# Patient Record
Sex: Female | Born: 1980 | Race: Black or African American | Hispanic: No | Marital: Single | Smoking: Former smoker
Health system: Southern US, Community
[De-identification: ages and names within clinical notes are randomized; demographics above are authoritative.]

## PROBLEM LIST (undated history)

## (undated) HISTORY — PX: EXTREMITY CYST EXCISION: SHX1558

---

## 2011-04-16 ENCOUNTER — Emergency Department (HOSPITAL_BASED_OUTPATIENT_CLINIC_OR_DEPARTMENT_OTHER)
Admission: EM | Admit: 2011-04-16 | Discharge: 2011-04-17 | Disposition: A | Discharge status: Home / Self Care | Payer: Self-pay | Attending: Emergency Medicine | Admitting: Emergency Medicine

## 2011-04-16 ENCOUNTER — Encounter (HOSPITAL_BASED_OUTPATIENT_CLINIC_OR_DEPARTMENT_OTHER): Payer: Self-pay | Admitting: *Deleted

## 2011-04-16 DIAGNOSIS — J029 Acute pharyngitis, unspecified: Secondary | ICD-10-CM | POA: Insufficient documentation

## 2011-04-16 DIAGNOSIS — H6692 Otitis media, unspecified, left ear: Secondary | ICD-10-CM

## 2011-04-16 DIAGNOSIS — H6690 Otitis media, unspecified, unspecified ear: Secondary | ICD-10-CM | POA: Insufficient documentation

## 2011-04-16 DIAGNOSIS — J209 Acute bronchitis, unspecified: Secondary | ICD-10-CM | POA: Insufficient documentation

## 2011-04-16 NOTE — ED Notes (Signed)
Pt. Crying in triage and has multiple complaints.  No noted distress.

## 2011-04-17 LAB — GLUCOSE, CAPILLARY: Glucose-Capillary: 159 mg/dL — ABNORMAL HIGH (ref 70–99)

## 2011-04-17 MED ORDER — ALBUTEROL SULFATE (5 MG/ML) 0.5% IN NEBU
5.0000 mg | INHALATION_SOLUTION | Freq: Once | RESPIRATORY_TRACT | Status: AC
  Administered 2011-04-17: 5 mg via RESPIRATORY_TRACT
  Filled 2011-04-17: qty 1

## 2011-04-17 MED ORDER — ALBUTEROL SULFATE HFA 108 (90 BASE) MCG/ACT IN AERS
2.0000 | INHALATION_SPRAY | Freq: Once | RESPIRATORY_TRACT | Status: AC
  Administered 2011-04-17: 2 via RESPIRATORY_TRACT
  Filled 2011-04-17: qty 6.7

## 2011-04-17 MED ORDER — AEROCHAMBER Z-STAT PLUS/MEDIUM MISC
1.0000 | Freq: Once | Status: AC
  Administered 2011-04-17: 1
  Filled 2011-04-17: qty 1

## 2011-04-17 MED ORDER — ANTIPYRINE-BENZOCAINE 5.4-1.4 % OT SOLN
3.0000 [drp] | Freq: Once | OTIC | Status: AC
  Administered 2011-04-17: 4 [drp] via OTIC
  Filled 2011-04-17: qty 10

## 2011-04-17 MED ORDER — IPRATROPIUM BROMIDE 0.02 % IN SOLN
0.5000 mg | Freq: Once | RESPIRATORY_TRACT | Status: AC
  Administered 2011-04-17: 0.5 mg via RESPIRATORY_TRACT
  Filled 2011-04-17: qty 2.5

## 2011-04-17 MED ORDER — CEPHALEXIN 500 MG PO CAPS: 500.0000 mg | ORAL_CAPSULE | Freq: Three times a day (TID) | ORAL | Status: AC

## 2011-04-17 MED ORDER — GUAIFENESIN 100 MG/5ML PO SOLN
5.0000 mL | Freq: Once | ORAL | Status: AC
  Administered 2011-04-17: 100 mg via ORAL
  Filled 2011-04-17: qty 5

## 2011-04-17 MED ORDER — GUAIFENESIN ER 600 MG PO TB12
1200.0000 mg | ORAL_TABLET | Freq: Two times a day (BID) | ORAL | Status: DC
  Filled 2011-04-17: qty 2

## 2011-04-17 MED ORDER — TRAMADOL-ACETAMINOPHEN 37.5-325 MG PO TABS: ORAL_TABLET | ORAL | Status: AC

## 2011-04-17 NOTE — Discharge Instructions (Signed)
Use the inhaler with the spacer 2 puffs every 4 hours for shortness of breath and coughing. He can take Mucinex DM over-the-counter for your cough. Use the eardrops for pain in your ears. Take the antibiotic until gone, it is $4 at [facility], [employer], or [facility]. Your blood sugar today was 159. Please look at the diabetic diet and try to follow it. He also need to try to get a family doctor to see if you need to be on diabetic medications again.

## 2011-04-17 NOTE — ED Provider Notes (Signed)
History     CSN: [id number]  Arrival date & time 04/16/11  2237   First MD Initiated Contact with Patient 04/16/11 2348      Chief Complaint  Patient presents with  . URI    stuffy nose with ear pain and vomiting.  C/O sore throat and cough.  Pt. is coughing up phlem dk green to brown.    (Consider location/radiation/quality/duration/timing/severity/associated sxs/prior treatment) HPI Patient relates a month ago she started getting cough with yellow dark green sputum, she is also having posttussive vomiting. She also states last night she started having pain in her left ear. She denies fever, diarrhea, but does have a sore throat. She also states she's having urinary incontinence when she coughs. She states she's not been evaluated for this in the past month. She has been around a 40-year-old who has had similar symptoms.  PCP none   Past Medical History  Diagnosis Date  . Diabetes mellitus    diagnosed in 2009, states the last time she was on medication was in November for one month  Past Surgical History  Procedure Date  . Extremity cyst excision     History reviewed. No pertinent family history.  History  Substance Use Topics  . Smoking status: No  . Smokeless tobacco: Not on file  . Alcohol Use: occassional   unemployed  OB History    Grav Para Term Preterm Abortions TAB SAB Ect Mult Living                  Review of Systems  All other systems reviewed and are negative.    Allergies  Review of patient's allergies indicates no known allergies.  Home Medications   Current Outpatient Rx  Name Route Sig Dispense Refill  . DEXTROMETHORPHAN-GUAIFENESIN 10-100 MG/5ML PO LIQD Oral Take 10 mLs by mouth once as needed. For cough    . DIPHENHYDRAMINE HCL 25 MG PO TABS Oral Take 50 mg by mouth every 6 (six) hours as needed. For cold symptoms    . LORATADINE 10 MG PO TABS Oral Take 10 mg by mouth daily.    Marland Kitchen METFORMIN HCL 500 MG PO TABS Oral Take 500 mg by mouth 2  (two) times daily.    Marland Kitchen PSEUDOEPH-DOXYLAMINE-DM-APAP 60-7.06-12-998 MG/30ML PO LIQD Oral Take 30 mLs by mouth at bedtime as needed. For cough and cold      BP 130/82  Pulse 103  Temp(Src) 98.4 F (36.9 C) (Oral)  Resp 19  Ht 5\' 9"  (1.753 m)  Wt 326 lb (147.873 kg)  BMI 48.14 kg/m2  SpO2 100%  LMP 03/26/2011  Vital signs normal except mild tachycardia   Physical Exam  Nursing note and vitals reviewed. Constitutional: She is oriented to person, place, and time. She appears well-developed and well-nourished.  Non-toxic appearance. She does not appear ill. No distress.       Obese, dramatic  HENT:  Head: Normocephalic and atraumatic.  Right Ear: External ear and ear canal normal. No drainage. Tympanic membrane is injected and erythematous. Tympanic membrane is not scarred, not perforated, not retracted and not bulging.  Left Ear: External ear and ear canal normal. No drainage. Tympanic membrane is injected and erythematous. Tympanic membrane is not scarred, not perforated, not retracted and not bulging.  Nose: Nose normal. No mucosal edema or rhinorrhea.  Mouth/Throat: Mucous membranes are normal. No dental abscesses or uvula swelling. Posterior oropharyngeal erythema present. No oropharyngeal exudate, posterior oropharyngeal edema or tonsillar abscesses.  Eyes: Conjunctivae and  EOM are normal. Pupils are equal, round, and reactive to light.  Neck: Normal range of motion and full passive range of motion without pain. Neck supple.  Cardiovascular: Normal rate, regular rhythm and normal heart sounds.  Exam reveals no gallop and no friction rub.   No murmur heard. Pulmonary/Chest: Effort normal. No respiratory distress. She has no wheezes. She has no rhonchi. She has no rales. She exhibits no tenderness and no crepitus.       Patient has very diminished breath sounds with poor air movement.  Abdominal: Soft. Normal appearance and bowel sounds are normal. She exhibits no distension. There  is no tenderness. There is no rebound and no guarding.  Musculoskeletal: Normal range of motion. She exhibits no edema and no tenderness.       Moves all extremities well.   Neurological: She is alert and oriented to person, place, and time. She has normal strength. No cranial nerve deficit.  Skin: Skin is warm, dry and intact. No rash noted. No erythema. No pallor.  Psychiatric: She has a normal mood and affect. Her speech is normal and behavior is normal. Her mood appears not anxious.    ED Course  Procedures (including critical care time)   Medications  albuterol (PROVENTIL) (5 MG/ML) 0.5% nebulizer solution 5 mg (5 mg Nebulization Given 04/17/11 0123)  ipratropium (ATROVENT) nebulizer solution 0.5 mg (0.5 mg Nebulization Given 04/17/11 0124)  antipyrine-benzocaine (AURALGAN) otic solution 3-4 drop (4 drop Both Ears Given 04/17/11 0139)  guaiFENesin (ROBITUSSIN) 100 MG/5ML solution 100 mg (100 mg Oral Given 04/17/11 0137)  albuterol (PROVENTIL HFA;VENTOLIN HFA) 108 (90 BASE) MCG/ACT inhaler 2 puff (2 puff Inhalation Given 04/17/11 0301)  aerochamber Z-Stat Plus/medium 1 each (1 each Other Given 04/17/11 0303)   Recheck after albuterol/Atrovent nebulizer shows improved air movement although still not normal. Patient was given a albuterol inhaler with spacer and shown how to use it.  Mother patient states they are talking to community clinic to get patient established care to get long-term care of her diabetes.  Labs Reviewed  GLUCOSE, CAPILLARY - Abnormal; Notable for the following:    Glucose-Capillary 159 (*)    All other components within normal limits   Laboratory interpretation all normal except mild hyperglycemia   1. Bronchitis with bronchospasm   2. Pharyngitis   3. Otitis media, left     New Prescriptions   CEPHALEXIN (KEFLEX) 500 MG CAPSULE    Take 1 capsule (500 mg total) by mouth 3 (three) times daily.   TRAMADOL-ACETAMINOPHEN (ULTRACET) 37.5-325 MG PER TABLET    2 tabs po  QID prn pain   Plan discharge Devoria Albe, MD, Armando Gang   MDM          Ward Givens, MD 04/17/11 [phone]

## 2011-04-17 NOTE — ED Notes (Signed)
MD at bedside. 

## 2014-12-17 ENCOUNTER — Ambulatory Visit: Payer: Self-pay

## 2015-01-21 ENCOUNTER — Ambulatory Visit: Payer: Self-pay

## 2015-09-15 DIAGNOSIS — M7989 Other specified soft tissue disorders: Secondary | ICD-10-CM | POA: Insufficient documentation

## 2020-12-16 ENCOUNTER — Other Ambulatory Visit: Payer: Self-pay

## 2020-12-16 ENCOUNTER — Encounter (HOSPITAL_BASED_OUTPATIENT_CLINIC_OR_DEPARTMENT_OTHER): Payer: Self-pay | Admitting: Emergency Medicine

## 2020-12-16 ENCOUNTER — Emergency Department (HOSPITAL_BASED_OUTPATIENT_CLINIC_OR_DEPARTMENT_OTHER): Payer: Self-pay

## 2020-12-16 ENCOUNTER — Inpatient Hospital Stay (HOSPITAL_BASED_OUTPATIENT_CLINIC_OR_DEPARTMENT_OTHER)
Admission: EM | Admit: 2020-12-16 | Discharge: 2020-12-24 | DRG: 617 | Disposition: A | Discharge status: Home / Self Care | Payer: Medicaid Other | Attending: Internal Medicine | Admitting: Internal Medicine

## 2020-12-16 DIAGNOSIS — I10 Essential (primary) hypertension: Secondary | ICD-10-CM | POA: Diagnosis present

## 2020-12-16 DIAGNOSIS — L089 Local infection of the skin and subcutaneous tissue, unspecified: Secondary | ICD-10-CM

## 2020-12-16 DIAGNOSIS — Z7984 Long term (current) use of oral hypoglycemic drugs: Secondary | ICD-10-CM

## 2020-12-16 DIAGNOSIS — E1169 Type 2 diabetes mellitus with other specified complication: Principal | ICD-10-CM | POA: Diagnosis present

## 2020-12-16 DIAGNOSIS — B954 Other streptococcus as the cause of diseases classified elsewhere: Secondary | ICD-10-CM | POA: Diagnosis present

## 2020-12-16 DIAGNOSIS — M869 Osteomyelitis, unspecified: Secondary | ICD-10-CM | POA: Diagnosis not present

## 2020-12-16 DIAGNOSIS — L03115 Cellulitis of right lower limb: Secondary | ICD-10-CM | POA: Diagnosis present

## 2020-12-16 DIAGNOSIS — K5900 Constipation, unspecified: Secondary | ICD-10-CM | POA: Diagnosis present

## 2020-12-16 DIAGNOSIS — M868X8 Other osteomyelitis, other site: Secondary | ICD-10-CM | POA: Diagnosis present

## 2020-12-16 DIAGNOSIS — E1165 Type 2 diabetes mellitus with hyperglycemia: Principal | ICD-10-CM | POA: Diagnosis present

## 2020-12-16 DIAGNOSIS — L97519 Non-pressure chronic ulcer of other part of right foot with unspecified severity: Secondary | ICD-10-CM | POA: Diagnosis present

## 2020-12-16 DIAGNOSIS — Z6841 Body Mass Index (BMI) 40.0 and over, adult: Secondary | ICD-10-CM

## 2020-12-16 DIAGNOSIS — E785 Hyperlipidemia, unspecified: Secondary | ICD-10-CM | POA: Diagnosis present

## 2020-12-16 DIAGNOSIS — E1152 Type 2 diabetes mellitus with diabetic peripheral angiopathy with gangrene: Secondary | ICD-10-CM | POA: Diagnosis present

## 2020-12-16 DIAGNOSIS — Z79899 Other long term (current) drug therapy: Secondary | ICD-10-CM

## 2020-12-16 DIAGNOSIS — Z91119 Patient's noncompliance with dietary regimen due to unspecified reason: Secondary | ICD-10-CM

## 2020-12-16 DIAGNOSIS — E871 Hypo-osmolality and hyponatremia: Secondary | ICD-10-CM | POA: Diagnosis present

## 2020-12-16 DIAGNOSIS — Z91018 Allergy to other foods: Secondary | ICD-10-CM

## 2020-12-16 DIAGNOSIS — Z833 Family history of diabetes mellitus: Secondary | ICD-10-CM

## 2020-12-16 DIAGNOSIS — E66813 Obesity, class 3: Secondary | ICD-10-CM

## 2020-12-16 DIAGNOSIS — L02611 Cutaneous abscess of right foot: Secondary | ICD-10-CM | POA: Diagnosis present

## 2020-12-16 DIAGNOSIS — Z8249 Family history of ischemic heart disease and other diseases of the circulatory system: Secondary | ICD-10-CM

## 2020-12-16 DIAGNOSIS — Z794 Long term (current) use of insulin: Secondary | ICD-10-CM

## 2020-12-16 DIAGNOSIS — Z20822 Contact with and (suspected) exposure to covid-19: Secondary | ICD-10-CM | POA: Diagnosis present

## 2020-12-16 DIAGNOSIS — M60871 Other myositis, right ankle and foot: Secondary | ICD-10-CM | POA: Diagnosis present

## 2020-12-16 DIAGNOSIS — E11621 Type 2 diabetes mellitus with foot ulcer: Secondary | ICD-10-CM | POA: Diagnosis present

## 2020-12-16 HISTORY — DX: Morbid (severe) obesity due to excess calories: E66.01

## 2020-12-16 HISTORY — DX: Obesity, class 3: E66.813

## 2020-12-16 LAB — HEMOGLOBIN A1C
Hgb A1c MFr Bld: 13.5 % — ABNORMAL HIGH (ref 4.8–5.6)
Mean Plasma Glucose: 340.75 mg/dL

## 2020-12-16 LAB — CBC WITH DIFFERENTIAL/PLATELET
Abs Immature Granulocytes: 0.35 10*3/uL — ABNORMAL HIGH (ref 0.00–0.07)
Basophils Absolute: 0.1 10*3/uL (ref 0.0–0.1)
Basophils Relative: 1 %
Eosinophils Absolute: 0 10*3/uL (ref 0.0–0.5)
Eosinophils Relative: 0 %
HCT: 39 % (ref 36.0–46.0)
Hemoglobin: 12.7 g/dL (ref 12.0–15.0)
Immature Granulocytes: 2 %
Lymphocytes Relative: 8 %
Lymphs Abs: 1.5 10*3/uL (ref 0.7–4.0)
MCH: 26.8 pg (ref 26.0–34.0)
MCHC: 32.6 g/dL (ref 30.0–36.0)
MCV: 82.3 fL (ref 80.0–100.0)
Monocytes Absolute: 1.6 10*3/uL — ABNORMAL HIGH (ref 0.1–1.0)
Monocytes Relative: 8 %
Neutro Abs: 16.3 10*3/uL — ABNORMAL HIGH (ref 1.7–7.7)
Neutrophils Relative %: 81 %
Platelets: 444 10*3/uL — ABNORMAL HIGH (ref 150–400)
RBC: 4.74 MIL/uL (ref 3.87–5.11)
RDW: 14.4 % (ref 11.5–15.5)
WBC: 19.9 10*3/uL — ABNORMAL HIGH (ref 4.0–10.5)
nRBC: 0 % (ref 0.0–0.2)

## 2020-12-16 LAB — COMPREHENSIVE METABOLIC PANEL
ALT: 9 U/L (ref 0–44)
AST: 13 U/L — ABNORMAL LOW (ref 15–41)
Albumin: 2.6 g/dL — ABNORMAL LOW (ref 3.5–5.0)
Alkaline Phosphatase: 112 U/L (ref 38–126)
Anion gap: 19 — ABNORMAL HIGH (ref 5–15)
BUN: 15 mg/dL (ref 6–20)
CO2: 22 mmol/L (ref 22–32)
Calcium: 8.9 mg/dL (ref 8.9–10.3)
Chloride: 87 mmol/L — ABNORMAL LOW (ref 98–111)
Creatinine, Ser: 0.87 mg/dL (ref 0.44–1.00)
GFR, Estimated: >60 mL/min (ref >60)
Glucose, Bld: 393 mg/dL — ABNORMAL HIGH (ref 70–99)
Potassium: 4.3 mmol/L (ref 3.5–5.1)
Sodium: 128 mmol/L — ABNORMAL LOW (ref 135–145)
Total Bilirubin: 0.9 mg/dL (ref 0.3–1.2)
Total Protein: 8.6 g/dL — ABNORMAL HIGH (ref 6.5–8.1)

## 2020-12-16 LAB — C-REACTIVE PROTEIN: CRP: 40.7 mg/dL — ABNORMAL HIGH (ref <1.0)

## 2020-12-16 LAB — CBG MONITORING, ED
Glucose-Capillary: 381 mg/dL — ABNORMAL HIGH (ref 70–99)
Glucose-Capillary: 339 mg/dL — ABNORMAL HIGH (ref 70–99)

## 2020-12-16 LAB — GLUCOSE, CAPILLARY
Glucose-Capillary: 231 mg/dL — ABNORMAL HIGH (ref 70–99)
Glucose-Capillary: 203 mg/dL — ABNORMAL HIGH (ref 70–99)

## 2020-12-16 LAB — PREALBUMIN: Prealbumin: <5 mg/dL — ABNORMAL LOW (ref 18–38)

## 2020-12-16 LAB — RESP PANEL BY RT-PCR (FLU A&B, COVID) ARPGX2
Influenza A by PCR: NEGATIVE
Influenza B by PCR: NEGATIVE
SARS Coronavirus 2 by RT PCR: NEGATIVE

## 2020-12-16 LAB — SEDIMENTATION RATE: Sed Rate: 139 mm/hr — ABNORMAL HIGH (ref 0–22)

## 2020-12-16 LAB — BETA-HYDROXYBUTYRIC ACID: Beta-Hydroxybutyric Acid: 3.69 mmol/L — ABNORMAL HIGH (ref 0.05–0.27)

## 2020-12-16 IMAGING — DX DG FOOT COMPLETE 3+V*R*
4 series · 4 of 4 positions shown · non-contrast
Comparison: None.

CLINICAL DATA: 40-year-old female with right foot infection, fever.
Diabetes.

EXAM:
RIGHT FOOT COMPLETE - 3+ VIEW

[foot ap (1 of 2)]
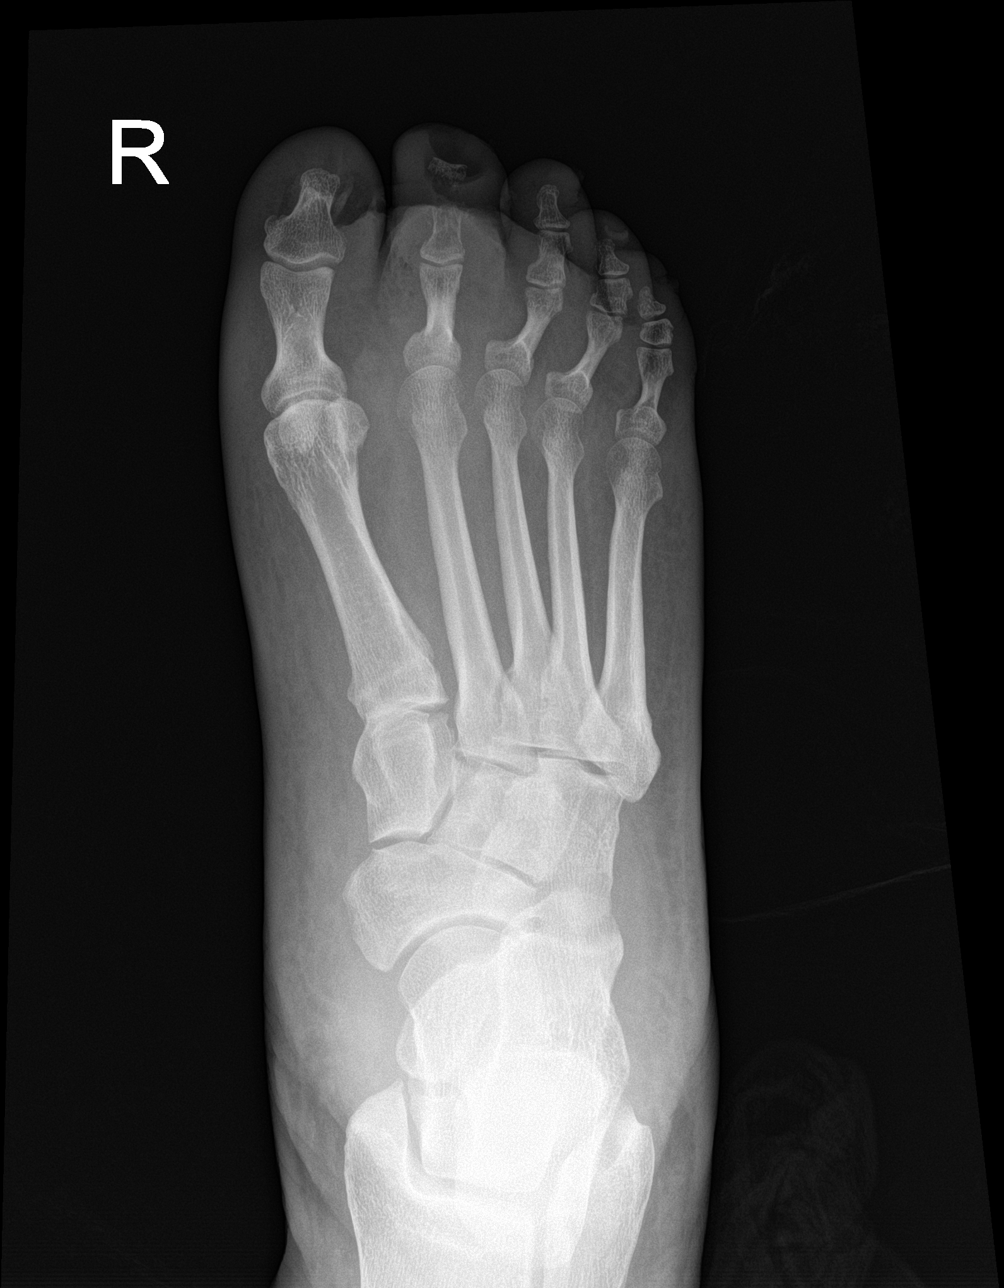

[foot obl]
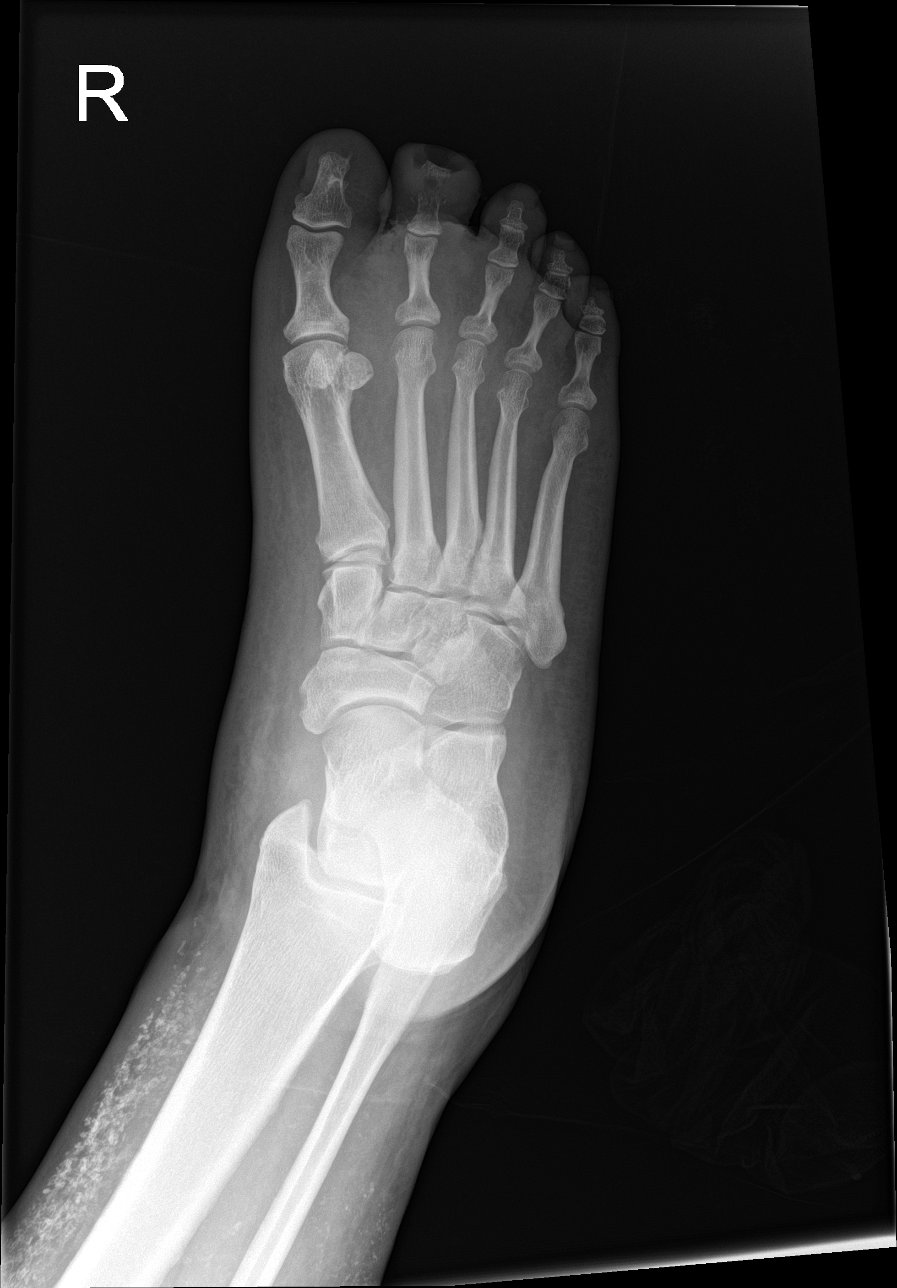

[foot lat]
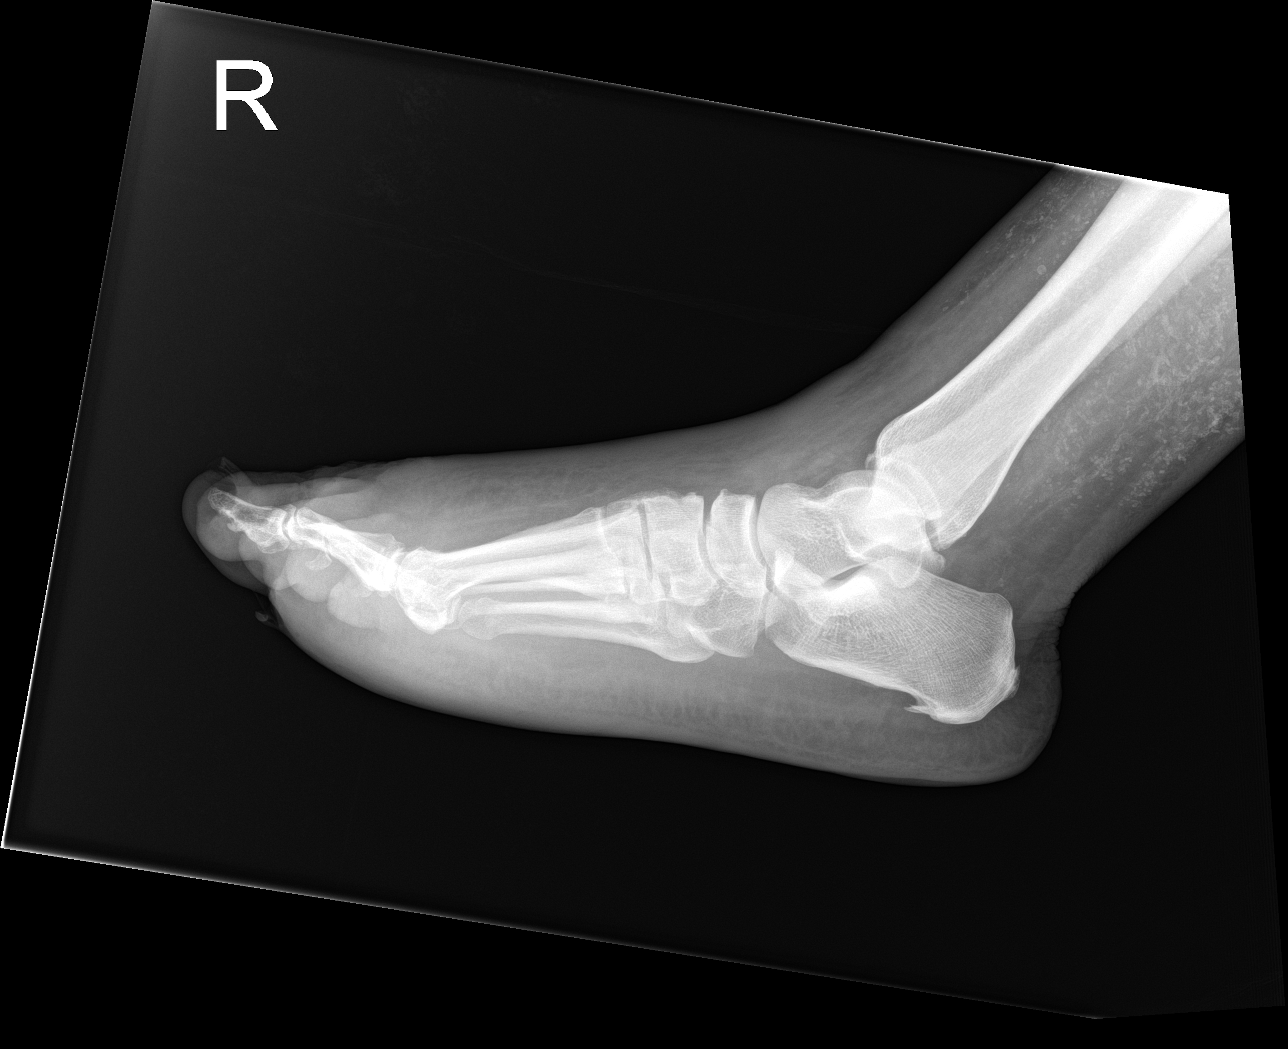

[foot ap (2 of 2)]
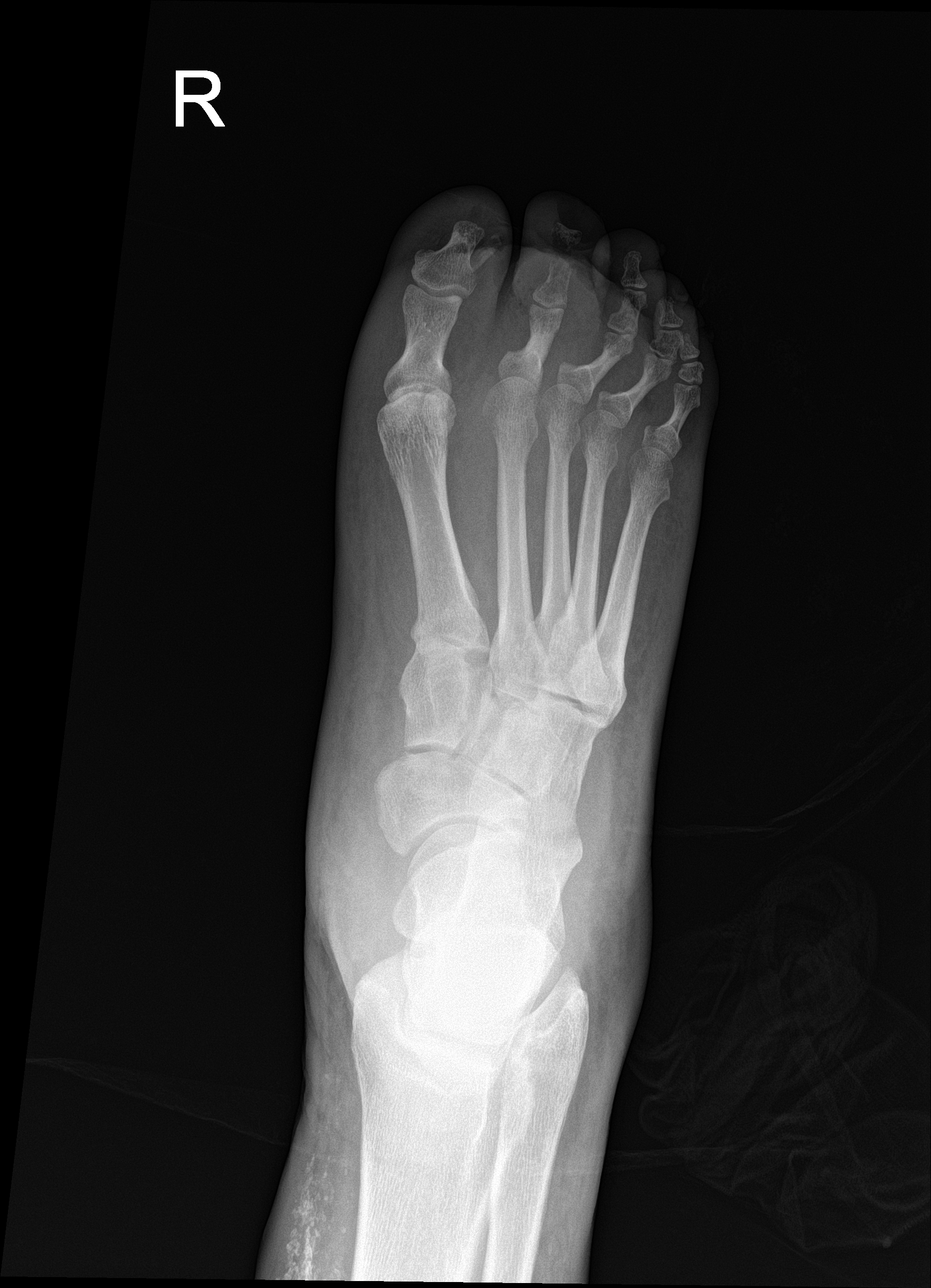

[4 of 4 positions shown; findings below may reference images not displayed]

FINDINGS: Soft tissue swelling throughout the distal foot, maximal at the 2nd
toe with gaping soft tissue ulceration or less likely soft tissue
gas. Destruction of the 2nd distal phalanx, and the distal half of
the middle phalanx. Destroyed 2nd D IP.

Bone mineralization elsewhere in the right foot maintained.
Extensive dystrophic calcifications in the soft tissues of the
distal leg.
IMPRESSION: Positive for Osteomyelitis of the 2nd toe distal AND middle
phalanges. Distal phalanx almost completely eroded.

## 2020-12-16 MED ORDER — SODIUM CHLORIDE 0.9 % IV SOLN
2.0000 g | Freq: Three times a day (TID) | INTRAVENOUS | Status: DC
  Administered 2020-12-16 – 2020-12-20 (×11): 2 g via INTRAVENOUS
  Filled 2020-12-16 (×12): qty 2

## 2020-12-16 MED ORDER — KETOROLAC TROMETHAMINE 30 MG/ML IJ SOLN
30.0000 mg | Freq: Once | INTRAMUSCULAR | Status: AC
  Administered 2020-12-16: 30 mg via INTRAVENOUS
  Filled 2020-12-16: qty 1

## 2020-12-16 MED ORDER — INSULIN ASPART 100 UNIT/ML IJ SOLN
0.0000 [IU] | Freq: Every day | INTRAMUSCULAR | Status: DC
  Administered 2020-12-16: 2 [IU] via SUBCUTANEOUS
  Administered 2020-12-19: 3 [IU] via SUBCUTANEOUS
  Administered 2020-12-20: 2 [IU] via SUBCUTANEOUS
  Administered 2020-12-21: 3 [IU] via SUBCUTANEOUS
  Administered 2020-12-22: 4 [IU] via SUBCUTANEOUS
  Administered 2020-12-23: 3 [IU] via SUBCUTANEOUS

## 2020-12-16 MED ORDER — VANCOMYCIN HCL 2000 MG/400ML IV SOLN
2000.0000 mg | Freq: Once | INTRAVENOUS | Status: AC
  Administered 2020-12-16: 2000 mg via INTRAVENOUS
  Filled 2020-12-16: qty 400

## 2020-12-16 MED ORDER — KETOROLAC TROMETHAMINE 30 MG/ML IJ SOLN
30.0000 mg | Freq: Four times a day (QID) | INTRAMUSCULAR | Status: AC
  Administered 2020-12-16 – 2020-12-17 (×3): 30 mg via INTRAVENOUS
  Filled 2020-12-16 (×3): qty 1

## 2020-12-16 MED ORDER — SODIUM CHLORIDE 0.9 % IV BOLUS
1000.0000 mL | Freq: Once | INTRAVENOUS | Status: AC
Start: 2020-12-16 — End: 2020-12-16
  Administered 2020-12-16: 1000 mL via INTRAVENOUS

## 2020-12-16 MED ORDER — ONDANSETRON HCL 4 MG/2ML IJ SOLN
4.0000 mg | Freq: Four times a day (QID) | INTRAMUSCULAR | Status: DC | PRN
  Administered 2020-12-17 (×2): 4 mg via INTRAVENOUS
  Filled 2020-12-16 (×3): qty 2

## 2020-12-16 MED ORDER — ACETAMINOPHEN 325 MG PO TABS
650.0000 mg | ORAL_TABLET | Freq: Four times a day (QID) | ORAL | Status: DC | PRN
  Administered 2020-12-17 – 2020-12-23 (×5): 650 mg via ORAL
  Filled 2020-12-16 (×5): qty 2

## 2020-12-16 MED ORDER — INSULIN ASPART 100 UNIT/ML IJ SOLN
0.0000 [IU] | Freq: Three times a day (TID) | INTRAMUSCULAR | Status: DC
  Administered 2020-12-16: 15 [IU] via SUBCUTANEOUS
  Administered 2020-12-16: 11 [IU] via SUBCUTANEOUS

## 2020-12-16 MED ORDER — ENOXAPARIN SODIUM 40 MG/0.4ML IJ SOSY
40.0000 mg | PREFILLED_SYRINGE | INTRAMUSCULAR | Status: DC
  Administered 2020-12-16: 40 mg via SUBCUTANEOUS
  Filled 2020-12-16: qty 0.4

## 2020-12-16 MED ORDER — OXYCODONE HCL 5 MG PO TABS
5.0000 mg | ORAL_TABLET | ORAL | Status: DC | PRN
  Administered 2020-12-16 – 2020-12-23 (×33): 5 mg via ORAL
  Filled 2020-12-16 (×34): qty 1

## 2020-12-16 MED ORDER — VANCOMYCIN HCL IN DEXTROSE 1-5 GM/200ML-% IV SOLN
1000.0000 mg | Freq: Once | INTRAVENOUS | Status: AC
  Administered 2020-12-16: 1000 mg via INTRAVENOUS
  Filled 2020-12-16: qty 200

## 2020-12-16 MED ORDER — MORPHINE SULFATE (PF) 4 MG/ML IV SOLN
4.0000 mg | Freq: Once | INTRAVENOUS | Status: AC
  Administered 2020-12-16: 4 mg via INTRAVENOUS
  Filled 2020-12-16: qty 1

## 2020-12-16 MED ORDER — ONDANSETRON HCL 4 MG/2ML IJ SOLN
4.0000 mg | Freq: Once | INTRAMUSCULAR | Status: AC
  Administered 2020-12-16: 4 mg via INTRAVENOUS
  Filled 2020-12-16: qty 2

## 2020-12-16 MED ORDER — HYDROMORPHONE HCL 1 MG/ML IJ SOLN
INTRAMUSCULAR | Status: AC
  Filled 2020-12-16: qty 2

## 2020-12-16 MED ORDER — ONDANSETRON HCL 4 MG PO TABS: 4.0000 mg | ORAL_TABLET | Freq: Four times a day (QID) | ORAL | Status: DC | PRN

## 2020-12-16 MED ORDER — INSULIN ASPART 100 UNIT/ML IJ SOLN
0.0000 [IU] | Freq: Three times a day (TID) | INTRAMUSCULAR | Status: DC
  Administered 2020-12-16 – 2020-12-17 (×2): 7 [IU] via SUBCUTANEOUS
  Administered 2020-12-17: 18:00:00 4 [IU] via SUBCUTANEOUS
  Administered 2020-12-17 – 2020-12-18 (×3): 7 [IU] via SUBCUTANEOUS
  Administered 2020-12-19: 4 [IU] via SUBCUTANEOUS
  Administered 2020-12-19: 15 [IU] via SUBCUTANEOUS
  Administered 2020-12-19 – 2020-12-20 (×2): 11 [IU] via SUBCUTANEOUS
  Administered 2020-12-20: 7 [IU] via SUBCUTANEOUS
  Administered 2020-12-20 – 2020-12-21 (×3): 15 [IU] via SUBCUTANEOUS
  Administered 2020-12-21: 11 [IU] via SUBCUTANEOUS
  Administered 2020-12-22 (×2): 15 [IU] via SUBCUTANEOUS
  Administered 2020-12-22: 7 [IU] via SUBCUTANEOUS
  Administered 2020-12-23: 11 [IU] via SUBCUTANEOUS
  Administered 2020-12-23: 15 [IU] via SUBCUTANEOUS
  Administered 2020-12-23: 7 [IU] via SUBCUTANEOUS
  Administered 2020-12-24: 15 [IU] via SUBCUTANEOUS

## 2020-12-16 MED ORDER — HYDROMORPHONE HCL 1 MG/ML IJ SOLN
2.0000 mg | Freq: Once | INTRAMUSCULAR | Status: AC
  Administered 2020-12-16: 2 mg via INTRAVENOUS

## 2020-12-16 MED ORDER — INSULIN ASPART 100 UNIT/ML IJ SOLN
8.0000 [IU] | Freq: Once | INTRAMUSCULAR | Status: AC
  Administered 2020-12-16: 8 [IU] via INTRAVENOUS

## 2020-12-16 MED ORDER — SODIUM CHLORIDE 0.9 % IV SOLN: INTRAVENOUS | Status: AC

## 2020-12-16 MED ORDER — VANCOMYCIN HCL 1500 MG/300ML IV SOLN
1500.0000 mg | Freq: Two times a day (BID) | INTRAVENOUS | Status: DC
  Administered 2020-12-17 – 2020-12-20 (×7): 1500 mg via INTRAVENOUS
  Filled 2020-12-16 (×8): qty 300

## 2020-12-16 MED ORDER — ACETAMINOPHEN 650 MG RE SUPP: 650.0000 mg | Freq: Four times a day (QID) | RECTAL | Status: DC | PRN

## 2020-12-16 MED ORDER — LACTATED RINGERS IV BOLUS
1000.0000 mL | Freq: Once | INTRAVENOUS | Status: AC
  Administered 2020-12-16: 1000 mL via INTRAVENOUS

## 2020-12-16 NOTE — Progress Notes (Signed)
Pharmacy Antibiotic Note  [name] is a 40 y.o. female admitted on 12/16/2020 with  DM foot ulcer .  Pharmacy has been consulted for vanc/cefepime dosing.  Plan: Vanc 1g already given in ER at 0430 this morning. Will give 2g x 1 dose now then start 1500mg  IV q12 thereafter - goal AUC 400-550 Cefepime 2g IV q8 per current renal function  Height: 5\' 9"  (175.3 cm) Weight: (!) 138.9 kg (306 lb 3.5 oz) IBW/kg (Calculated) : 66.2  Temp (24hrs), Avg:98.5 F (36.9 C), Min:98 F (36.7 C), Max:99.5 F (37.5 C)  Recent Labs  Lab 12/16/20 0345  WBC 19.9*  CREATININE 0.87    Estimated Creatinine Clearance: 130.6 mL/min (by C-G formula based on SCr of 0.87 mg/dL).    Allergies  Allergen Reactions   Mangifera Indica Rash    FRESH MANGO     Thank you for allowing pharmacy to be a part of this patient's care.  12/16/2020 5:44 PM

## 2020-12-16 NOTE — ED Provider Notes (Signed)
[facility] EMERGENCY DEPARTMENT Provider Note   CSN: [id number] Arrival date & time: 12/16/20  0230     History Chief Complaint  Patient presents with   Foot Pain    [name] is a 40 y.o. female.  Patient is a 40 year old female with past medical history of poorly controlled diabetes.  She presents today for evaluation of weakness, elevated blood sugars, fever, and worsening sore to the right second toe.  She has a history of a prior toe ulcer that seem to clear up, however has been worsening over the past several months.  Patient has been trying to treat this at home, however this has been worsening.  She is now running fevers and feels weak.  Patient has continued to work despite the ongoing foot sore.  The history is provided by the patient.  Foot Pain This is a recurrent problem. Episode onset: Several months. The problem occurs constantly. The problem has been rapidly worsening. Nothing aggravates the symptoms. Nothing relieves the symptoms.      Past Medical History:  Diagnosis Date   Diabetes mellitus     There are no problems to display for this patient.   Past Surgical History:  Procedure Laterality Date   EXTREMITY CYST EXCISION       OB History   No obstetric history on file.     Family History  Problem Relation Age of Onset   Hypertension Mother    Diabetes Mother    Diabetes Other     Social History   Tobacco Use   Smoking status: Never  Vaping Use   Vaping Use: Never used  Substance Use Topics   Alcohol use: Yes    Comment: occ   Drug use: Yes    Types: Marijuana    Home Medications Prior to Admission medications   Medication Sig Start Date End Date Taking? Authorizing Provider  dextromethorphan-guaiFENesin (ROBITUSSIN COLD COUGH+ CHEST) 10-100 MG/5ML liquid Take 10 mLs by mouth once as needed. For cough    [provider]  diphenhydrAMINE (BENADRYL) 25 MG tablet Take 50 mg by mouth every 6 (six) hours as  needed. For cold symptoms    [provider]  loratadine (CLARITIN) 10 MG tablet Take 10 mg by mouth daily.    [provider]  metFORMIN (GLUCOPHAGE) 500 MG tablet Take 500 mg by mouth 2 (two) times daily.    [provider]  Pseudoeph-Doxylamine-DM-APAP (NYQUIL) 60-7.06-12-998 MG/30ML LIQD Take 30 mLs by mouth at bedtime as needed. For cough and cold    [provider]    Allergies    Patient has no known allergies.  Review of Systems   Review of Systems  All other systems reviewed and are negative.  Physical Exam Updated Vital Signs BP 114/67 (BP Location: Right Arm)   Pulse (!) 104   Temp 99.5 F (37.5 C) (Oral)   Resp 18   Ht 5\' 9"  (1.753 m)   LMP 12/09/2020 (Approximate)   SpO2 95%   BMI 48.14 kg/m   Physical Exam Vitals and nursing note reviewed.  Constitutional:      General: She is not in acute distress.    Appearance: She is well-developed. She is not diaphoretic.  HENT:     Head: Normocephalic and atraumatic.  Cardiovascular:     Rate and Rhythm: Normal rate and regular rhythm.     Heart sounds: No murmur heard.   No friction rub. No gallop.  Pulmonary:  Effort: Pulmonary effort is normal. No respiratory distress.     Breath sounds: Normal breath sounds. No wheezing.  Abdominal:     General: Bowel sounds are normal. There is no distension.     Palpations: Abdomen is soft.     Tenderness: There is no abdominal tenderness.  Musculoskeletal:        General: Normal range of motion.     Cervical back: Normal range of motion and neck supple.     Comments: The right second toe is necrotic and draining purulent material.  Bone is visible at the tip of the toe.  There is redness, warmth, and erythema extending into the midfoot.  Skin:    General: Skin is warm and dry.  Neurological:     General: No focal deficit present.     Mental Status: She is alert and oriented to person, place, and time.    ED Results / Procedures /  Treatments   Labs (all labs ordered are listed, but only abnormal results are displayed) Labs Reviewed - No data to display  EKG EKG Interpretation  Date/Time:  Saturday December 16 2020 03:35:55 EST Ventricular Rate:  96 PR Interval:  145 QRS Duration: 80 QT Interval:  342 QTC Calculation: 433 R Axis:   40 Text Interpretation: Sinus rhythm Consider anterior infarct Confirmed by Geoffery Lyons ([id number]) on 12/16/2020 3:49:57 AM  Radiology No results found.  Procedures Procedures   Medications Ordered in ED Medications  sodium chloride 0.9 % bolus 1,000 mL (has no administration in time range)    ED Course  I have reviewed the triage vital signs and the nursing notes.  Pertinent labs & imaging results that were available during my care of the patient were reviewed by me and considered in my medical decision making (see chart for details).    MDM Rules/Calculators/A&P  Patient presenting here with several days of low-grade fevers and worsening swelling and drainage from her right second toe.  This has apparently been worsening over the past several months.  On exam, when removing the dressing, the toenail came off with the dressing and there was exposed bone noted.  There is foul-smelling tissue and drainage and x-ray diagnostic of osteomyelitis.  Patient also has white count of 20,000 for which blood cultures were obtained.  Patient also given IV vancomycin and will be admitted to the hospitalist service.  I have spoken with Dr. Rachael Darby who agrees to admit.  Sugar also nearly 400.  This was treated with IV fluids and IV insulin.  CRITICAL CARE Performed by: Geoffery Lyons Total critical care time: 45 minutes Critical care time was exclusive of separately billable procedures and treating other patients. Critical care was necessary to treat or prevent imminent or life-threatening deterioration. Critical care was time spent personally by me on the following activities:  development of treatment plan with patient and/or surrogate as well as nursing, discussions with consultants, evaluation of patient's response to treatment, examination of patient, obtaining history from patient or surrogate, ordering and performing treatments and interventions, ordering and review of laboratory studies, ordering and review of radiographic studies, pulse oximetry and re-evaluation of patient's condition.   Final Clinical Impression(s) / ED Diagnoses Final diagnoses:  None    Rx / DC Orders ED Discharge Orders     None        Geoffery Lyons, MD 12/16/20 [phone]

## 2020-12-16 NOTE — H&P (Signed)
History and Physical    [name]:[id number] DOB: 16-Apr-1980 DOA: 12/16/2020  PCP: Pcp, No   Patient coming from: Home.   I have personally briefly reviewed patient's old medical records in [place]  Chief Complaint: Right foot ulcer.  HPI: [name] is a 40 y.o. female with medical history significant of class III obesity, hyperlipidemia, type 2 diabetes currently not on medical therapy who is coming from [place] emergency department where she presented with that week history of worsening chronic toe ulcer, which she has had for the past year plus, now coming with increased pain, development of purulent discharge and discoloration of second toe.  She does not remember having any trauma to the area.  No fever, chills, but feels fatigue.  No rhinorrhea, sore throat, wheezing, dyspnea or hemoptysis.  No chest pain, palpitations, diaphoresis, PND, orthopnea or pitting edema of the lower extremities.  She has some nausea and vomiting earlier today.  No abdominal pain, diarrhea, constipation, melena or hematochezia.  No dysuria, frequency or hematuria.  She does not check her blood glucose regularly and has not been under treatment recently.  She denied polyuria, polydipsia, polyphagia or blurred vision.  She does not have a PCP currently.  ED Course: Initial vital signs were temperature 99.5 F, pulse 104, respiration 18, BP 114/67 mmHg O2 sat 95% on room air.  Lab work: CBC showed a white count 19.9, hemoglobin 12.7 g/dL and platelets 444.  CMP showed normal electrolytes when sodium is corrected to glucose.  Renal function was normal.  Glucose was 393 mg/dL.  Total protein was 8.6 and albumin 2.6 g/dL.  The rest of the hepatic functions were normal.  Imaging: Positive for osteomyelitis of the second toe distal and middle phalanges.  Distal phalanx is almost completely eroded.  Please see images and full radiology report for further details.  Review of Systems:  As per HPI otherwise all other systems reviewed and are negative.  Past Medical History:  Diagnosis Date   Class 3 obesity ([place]) 12/16/2020   Diabetes mellitus    Past Surgical History:  Procedure Laterality Date   EXTREMITY CYST EXCISION     Social History  reports that she has never smoked. She does not have any smokeless tobacco history on file. She reports current alcohol use. She reports current drug use. Drug: Marijuana.  Allergies  Allergen Reactions   Mangifera Indica Rash    FRESH MANGO   Family History  Problem Relation Age of Onset   Hypertension Mother    Diabetes Mother    Diabetes Other    Prior to Admission medications   Medication Sig Start Date End Date Taking? Authorizing Provider  naproxen (NAPROSYN) 250 MG tablet Take 250 mg by mouth 2 (two) times daily as needed for moderate pain.   Yes [provider]   Physical Exam: Vitals:   12/16/20 1124 12/16/20 1200 12/16/20 1304 12/16/20 1358  BP:  116/85 119/74   Pulse:  91 88   Resp:  14 18   Temp: 98.1 F (36.7 C)     TempSrc: Oral     SpO2:  99% 97%   Weight:    (!) 138.9 kg  Height:    5' 9"  (1.753 m)   Constitutional: NAD, calm, comfortable Eyes: PERRL, lids and conjunctivae normal ENMT: Mucous membranes are moist. Posterior pharynx clear of any exudate or lesions.  Neck: normal, supple, no masses, no thyromegaly Respiratory: clear to auscultation bilaterally,  no wheezing, no crackles. Normal respiratory effort. No accessory muscle use.  Cardiovascular: Regular rate and rhythm, no murmurs / rubs / gallops. No extremity edema. 2+ pedal pulses. No carotid bruits.  Abdomen: Obese, no distention.  Soft, no tenderness, no masses palpated. No hepatosplenomegaly. Bowel sounds positive.  Musculoskeletal: no clubbing / cyanosis. Good ROM, no contractures. Normal muscle tone.  Skin: Positive right foot erythema, edema, calor, TTP with gangrene of second right toe and dorsal/plantar purulent bullae.   Please see pictures below. Neurologic: CN 2-12 grossly intact. Sensation intact, DTR normal. Strength 5/5 in all 4.  Psychiatric: Normal judgment and insight. Alert and oriented x 3. Normal mood.          Labs on Admission: I have personally reviewed following labs and imaging studies  CBC: Recent Labs  Lab 12/16/20 0345  WBC 19.9*  NEUTROABS 16.3*  HGB 12.7  HCT 39.0  MCV 82.3  PLT 444*    Basic Metabolic Panel: Recent Labs  Lab 12/16/20 0345  NA 128*  K 4.3  CL 87*  CO2 22  GLUCOSE 393*  BUN 15  CREATININE 0.87  CALCIUM 8.9    GFR: Estimated Creatinine Clearance: 130.6 mL/min (by C-G formula based on SCr of 0.87 mg/dL).  Liver Function Tests: Recent Labs  Lab 12/16/20 0345  AST 13*  ALT 9  ALKPHOS 112  BILITOT 0.9  PROT 8.6*  ALBUMIN 2.6*    Urine analysis: No results found for: COLORURINE, APPEARANCEUR, LABSPEC, PHURINE, GLUCOSEU, HGBUR, BILIRUBINUR, KETONESUR, PROTEINUR, UROBILINOGEN, NITRITE, LEUKOCYTESUR  Radiological Exams on Admission: DG Foot Complete Right  Result Date: 12/16/2020 CLINICAL DATA:  40 year old female with right foot infection, fever. Diabetes. EXAM: RIGHT FOOT COMPLETE - 3+ VIEW COMPARISON:  None. FINDINGS: Soft tissue swelling throughout the distal foot, maximal at the 2nd toe with gaping soft tissue ulceration or less likely soft tissue gas. Destruction of the 2nd distal phalanx, and the distal half of the middle phalanx. Destroyed 2nd D IP. Bone mineralization elsewhere in the right foot maintained. Extensive dystrophic calcifications in the soft tissues of the distal leg. IMPRESSION: Positive for Osteomyelitis of the 2nd toe distal AND middle phalanges. Distal phalanx almost completely eroded. Electronically Signed   By: Genevie Ann M.D.   On: 12/16/2020 04:17    EKG: Independently reviewed.  Vent. 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 Continue IV fluids. Analgesics as needed. Antiemetics as needed. Cefepime per pharmacy. Vancomycin per pharmacy. Check ESR, CRP, prealbumin. Check lower extremity arterial Doppler. Check MRI right foot. Consult orthopedic surgery.  Active Problems:   Type 2 diabetes mellitus with hyperglycemia (HCC) Continue IV fluids. CBG monitoring with RI SS. Check hemoglobin A1c.    Class 3 obesity (HCC) Lifestyle modifications. Needs to establish with PCP.    Hyperlipidemia Currently not on medical therapy. Lifestyle modifications and establishment with PCP.    DVT prophylaxis: Lovenox SQ. Code Status:   Full code. Family Communication:   Disposition Plan:   Patient is from:  Home.  Anticipated DC to:  Home.  Anticipated DC date:  12/19/2020 or 12/20/2020.  Anticipated DC barriers: Clinical status. Consults called:  Secure chat message sent to orthopedic surgery on-call. Admission status:  Inpatient/MedSurg.    Severity of Illness:High severity after presenting with cellulitis of right foot with osteomyelitis  involving her toes.  The patient will need to remain for IV antibiotic therapy, pain control and evaluation by orthopedic surgery.  The patient will likely need an amputation.  Reubin Milan MD [facility]  How to contact the [facility] Attending or Consulting provider [place] or covering provider during after hours [place], for this patient?   Check the care team in [facility] and look for a) attending/consulting [facility] provider listed and b) the [facility] team listed Log into [email or web address] and use [place]'s universal password to access. If you do not have the password, please contact the hospital operator. Locate the [facility] provider you are looking for under [facility] and page to a number that you can be directly reached. If you still have difficulty  reaching the provider, please page the [facility] (Director on Call) for the Hospitalists listed on amion for assistance.  12/16/2020, 5:38 PM   This document was prepared with [employer] and may contain some unintended transcription errors.

## 2020-12-16 NOTE — ED Triage Notes (Signed)
Pt's mother states pt has an infection on her right foot  States the infection has made the pt sick and she has been running a fever and vomiting x 2 days  Pt is a diabetic and her blood sugar is elevated

## 2020-12-17 ENCOUNTER — Inpatient Hospital Stay (HOSPITAL_COMMUNITY): Payer: Self-pay

## 2020-12-17 ENCOUNTER — Encounter (HOSPITAL_COMMUNITY): Payer: Self-pay

## 2020-12-17 ENCOUNTER — Inpatient Hospital Stay (HOSPITAL_COMMUNITY): Payer: Medicaid Other

## 2020-12-17 DIAGNOSIS — L0390 Cellulitis, unspecified: Secondary | ICD-10-CM

## 2020-12-17 DIAGNOSIS — M869 Osteomyelitis, unspecified: Secondary | ICD-10-CM | POA: Diagnosis not present

## 2020-12-17 LAB — CBC
HCT: 35 % — ABNORMAL LOW (ref 36.0–46.0)
Hemoglobin: 11.5 g/dL — ABNORMAL LOW (ref 12.0–15.0)
MCH: 27.5 pg (ref 26.0–34.0)
MCHC: 32.9 g/dL (ref 30.0–36.0)
MCV: 83.7 fL (ref 80.0–100.0)
Platelets: 351 10*3/uL (ref 150–400)
RBC: 4.18 MIL/uL (ref 3.87–5.11)
RDW: 14.7 % (ref 11.5–15.5)
WBC: 18.1 10*3/uL — ABNORMAL HIGH (ref 4.0–10.5)
nRBC: 0 % (ref 0.0–0.2)

## 2020-12-17 LAB — COMPREHENSIVE METABOLIC PANEL WITH GFR
ALT: 10 U/L (ref 0–44)
AST: 18 U/L (ref 15–41)
Albumin: 2.3 g/dL — ABNORMAL LOW (ref 3.5–5.0)
Alkaline Phosphatase: 86 U/L (ref 38–126)
Anion gap: 10 (ref 5–15)
BUN: 17 mg/dL (ref 6–20)
CO2: 22 mmol/L (ref 22–32)
Calcium: 8.2 mg/dL — ABNORMAL LOW (ref 8.9–10.3)
Chloride: 97 mmol/L — ABNORMAL LOW (ref 98–111)
Creatinine, Ser: 0.84 mg/dL (ref 0.44–1.00)
GFR, Estimated: >60 mL/min
Glucose, Bld: 231 mg/dL — ABNORMAL HIGH (ref 70–99)
Potassium: 3.8 mmol/L (ref 3.5–5.1)
Sodium: 129 mmol/L — ABNORMAL LOW (ref 135–145)
Total Bilirubin: 0.9 mg/dL (ref 0.3–1.2)
Total Protein: 7.2 g/dL (ref 6.5–8.1)

## 2020-12-17 LAB — GLUCOSE, CAPILLARY
Glucose-Capillary: 226 mg/dL — ABNORMAL HIGH (ref 70–99)
Glucose-Capillary: 233 mg/dL — ABNORMAL HIGH (ref 70–99)
Glucose-Capillary: 191 mg/dL — ABNORMAL HIGH (ref 70–99)
Glucose-Capillary: 196 mg/dL — ABNORMAL HIGH (ref 70–99)

## 2020-12-17 LAB — HIV ANTIBODY (ROUTINE TESTING W REFLEX): HIV Screen 4th Generation wRfx: NONREACTIVE

## 2020-12-17 LAB — SURGICAL PCR SCREEN
MRSA, PCR: NEGATIVE
Staphylococcus aureus: NEGATIVE

## 2020-12-17 IMAGING — MR MR ANKLE*R* WO/W CM
7 of 9 series · 28 of 40 positions shown · IV contrast (gadavist)
Comparison: Radiographs [DATE]

CLINICAL DATA: Diabetes, foot infection, preoperative planning.

EXAM:
MRI OF THE RIGHT ANKLE WITHOUT AND WITH CONTRAST
TECHNIQUE: Multiplanar, multisequence MR imaging of the ankle was performed
before and after the administration of intravenous contrast.
CONTRAST:  10mL GADAVIST GADOBUTROL 1 MMOL/ML IV SOLN

[Series 2: T2 fat-sat · axial · right · 4.0mm · 0.47mm/px · z∈[-8,+187]mm · 5 of 40 slices shown (1 of 2)]
[im 1/40]
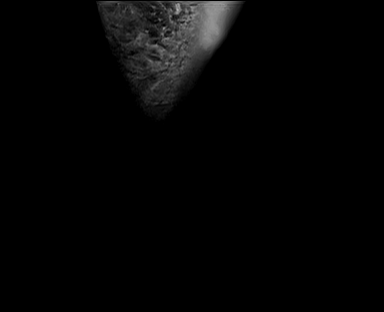
[im 10/40]
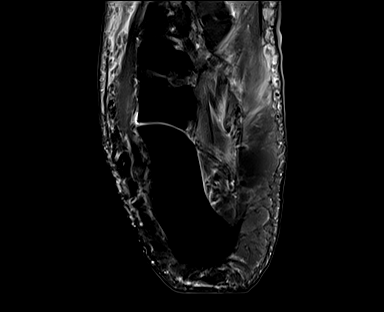
[im 20/40]
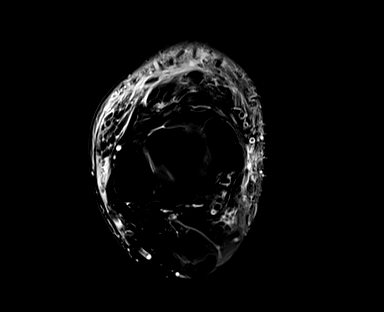
[im 30/40]
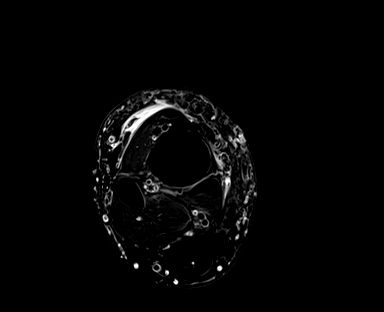
[im 40/40]
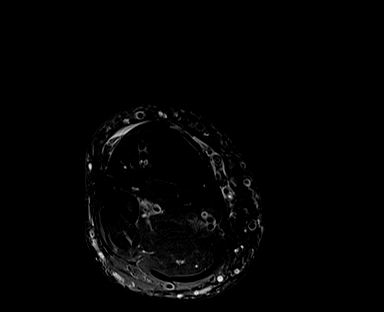

[Series 3: PD fat-sat · axial · right · 4.0mm · 0.50mm/px · z∈[-7,+187]mm · 5 of 40 slices shown]
[im 1/40]
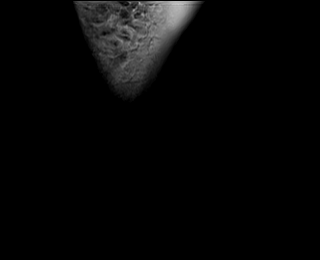
[im 10/40]
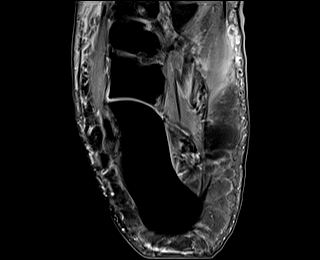
[im 20/40]
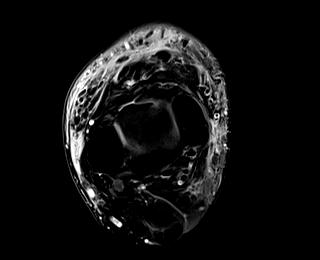
[im 30/40]
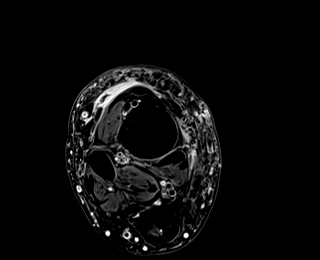
[im 40/40]
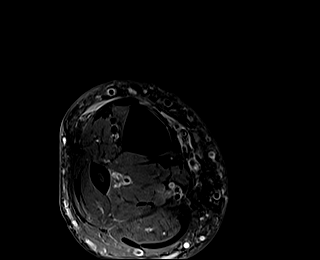

[Series 4: T2 fat-sat · coronal · right · 3.0mm · 0.56mm/px · 5 of 45 slices shown (2 of 2)]
[im 1/45]
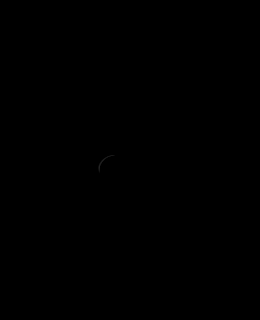
[im 12/45]
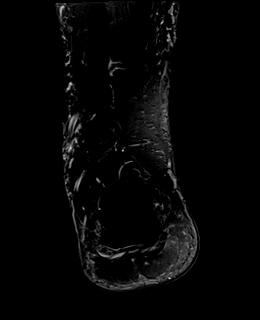
[im 23/45]
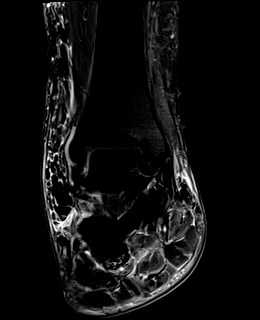
[im 34/45]
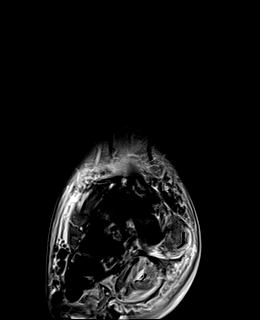
[im 45/45]
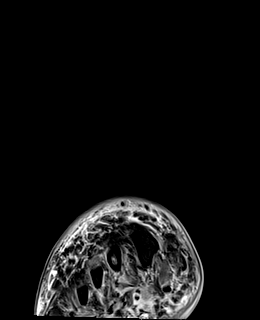

[Series 5: T1 · sagittal · right · 3.0mm · 0.50mm/px · 4 of 35 slices shown (1 of 2)]
[im 1/35]
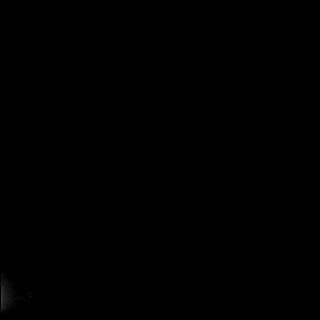
[im 12/35]
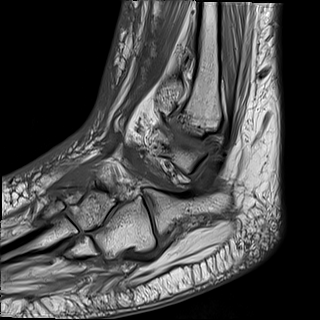
[im 23/35]
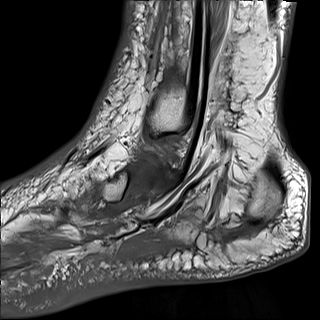
[im 35/35]
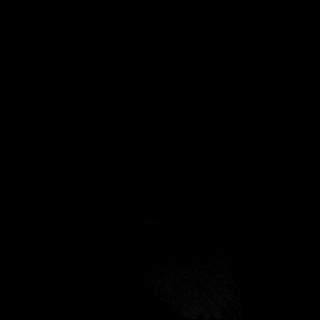

[Series 7: T1 · axial · right · 4.0mm · 0.21mm/px · z∈[-7,+187]mm · 4 of 40 slices shown (2 of 2)]
[im 1/40]
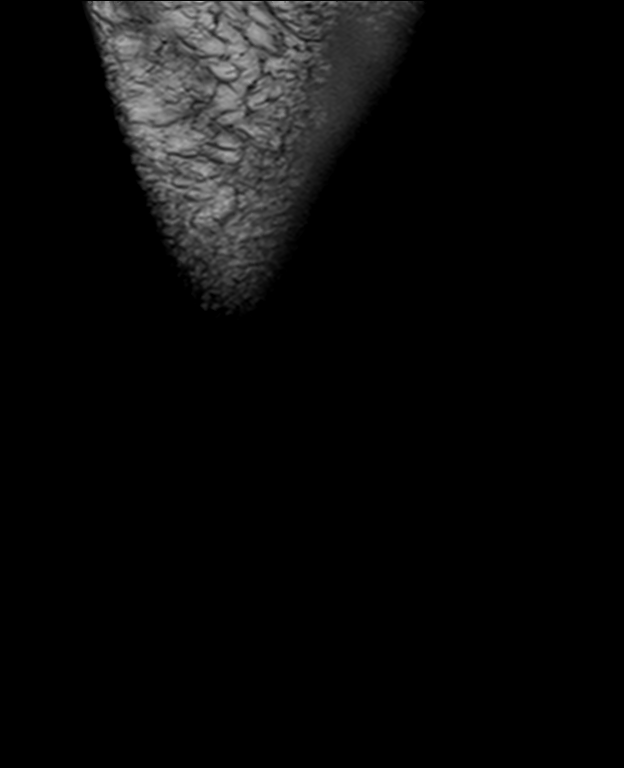
[im 14/40]
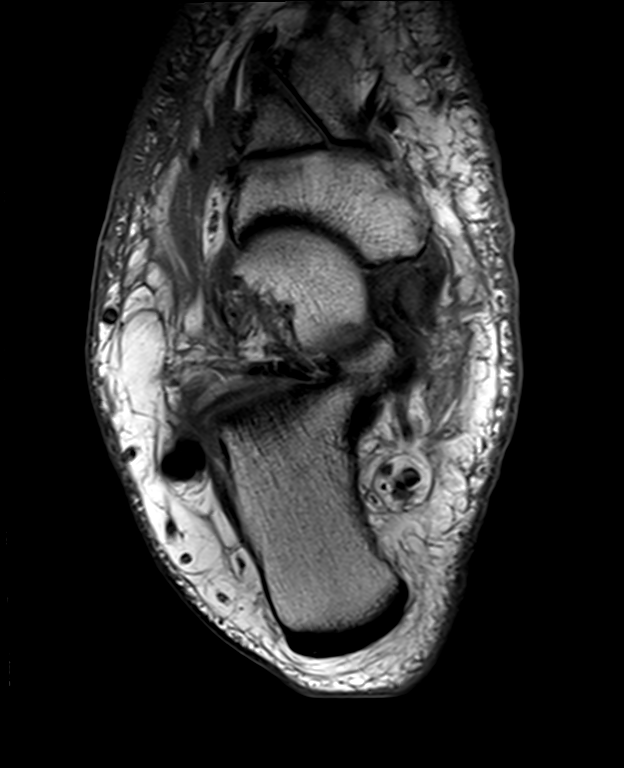
[im 27/40]
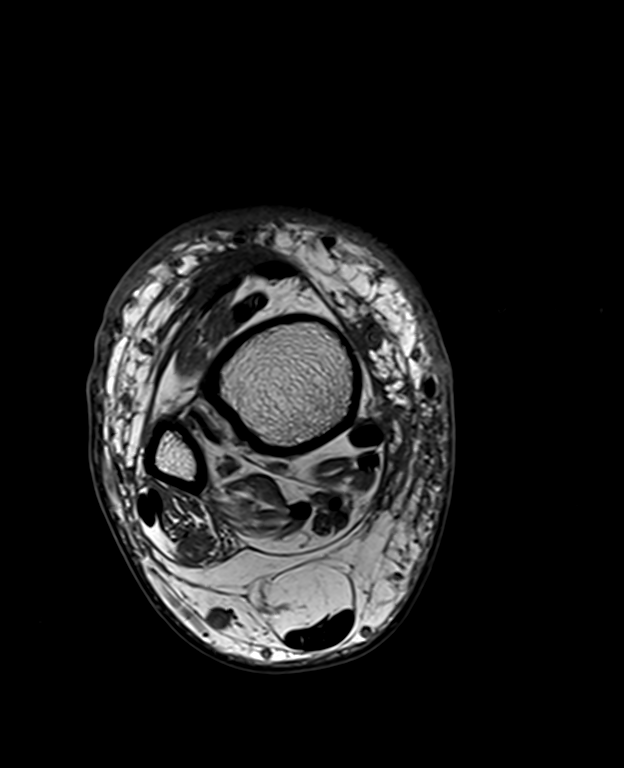
[im 40/40]
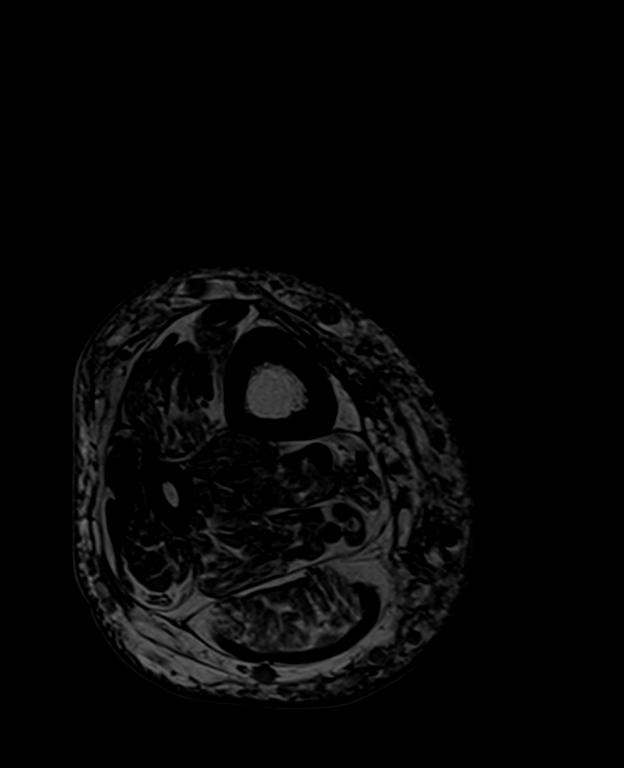

[Series 8: T1 fat-sat · axial · non-contrast · right · 4.0mm · 0.31mm/px · z∈[-7,+187]mm · 4 of 40 slices shown]
[im 1/40]
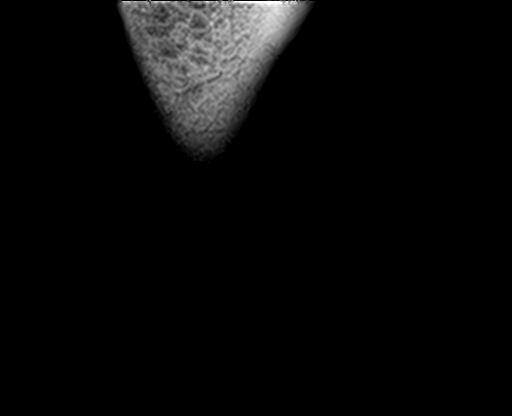
[im 14/40]
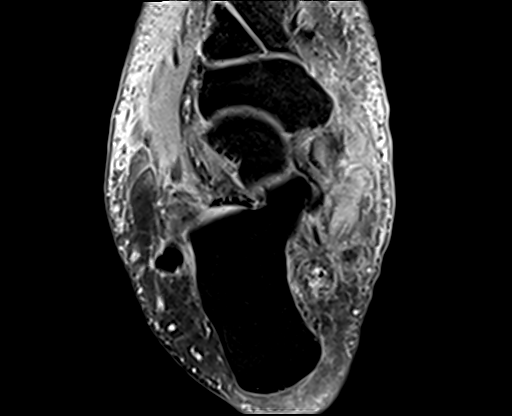
[im 27/40]
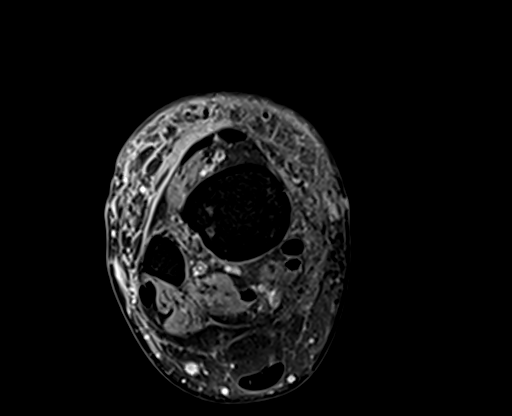
[im 40/40]
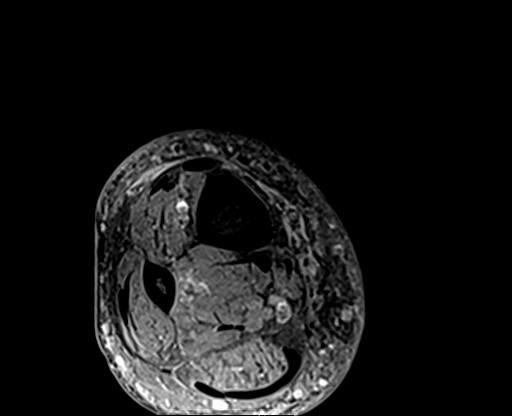

[Series 9: T1 fat-sat post-contrast · axial · right · 4.0mm · 0.31mm/px · 1 of 40 slices shown]
[im 1/40]
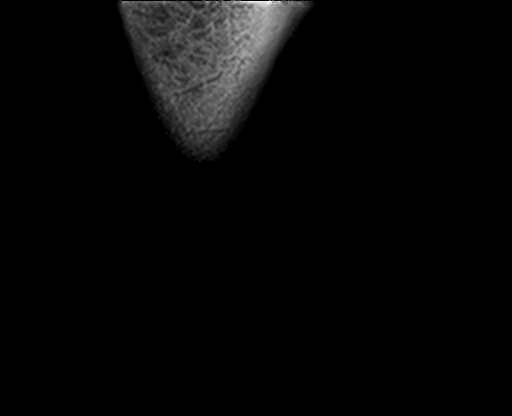

[28 of 40 positions shown; findings below may reference images not displayed]

FINDINGS: TENDONS

Peroneal: Unremarkable

Posteromedial: Moderate distal tibialis posterior tendinopathy.

Anterior: Unremarkable

Achilles: Unremarkable

Plantar Fascia: No substantial thickening of the plantar fascia.

LIGAMENTS

Lateral: Unremarkable

Medial: Unremarkable

CARTILAGE

Ankle Joint: 3 mm non-fragmented osteochondral lesion of the medial
talar dome, image 21 series 4.

Subtalar Joints/Sinus Tarsi: Unremarkable

Bones: Marrow edema in the shaft of the second metatarsal compatible
osteomyelitis based on the foot MRI appearance.

Other: 1.1 by 0.7 by 6.4 cm (volume = 3 cm^3) fluid signal
intensity collection with enhancing margins tracking within along
the dorsal portion of the flexor digitorum brevis muscle as shown on
image 29 series 10. Appearance compatible with abscess. There is
abnormal edema and enhancement in the flexor digitorum brevis muscle
favoring myositis, and low-level edema tracking in the abductor
hallucis muscle potentially also from myositis.

Dorsal subcutaneous edema and enhancement along the ankle favoring
cellulitis.
IMPRESSION: IMPRESSION
1. 3 cc abscess tracking within the dorsal portion of the flexor
digitorum brevis muscle. Myositis involving the flexor digitorum
brevis and abductor hallucis muscles.
2. Marrow edema in the shaft of the second metatarsal compatible
with osteomyelitis based on the foot MRI appearance.
3. 3 mm non-fragmented osteochondral lesion of the medial talar
dome.
4. Moderate distal tibialis posterior tendinopathy.
5. Dorsal cellulitis along the ankle.

## 2020-12-17 IMAGING — MR MR FOOT*R* WO/W CM
9 series · 39 of 40 positions shown · IV contrast (gadavist)
Comparison: Radiographs [DATE]

CLINICAL DATA: Osteomyelitis of the foot

EXAM:
MRI OF THE RIGHT FOREFOOT WITHOUT AND WITH CONTRAST
TECHNIQUE: Multiplanar, multisequence MR imaging of the right forefoot was
performed before and after the administration of intravenous
contrast.
CONTRAST:  10 cc Gadavist

[Series 3: T1 · coronal · right · 3.0mm · 0.47mm/px · 5 of 44 slices shown (1 of 2)]
[im 1/44]
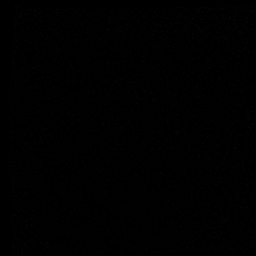
[im 11/44]
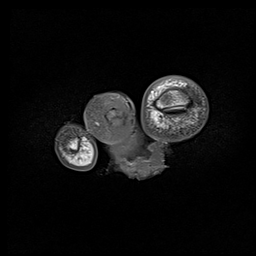
[im 22/44]
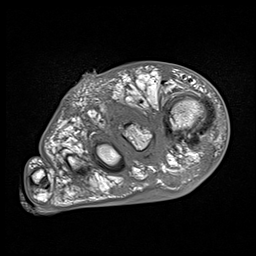
[im 33/44]
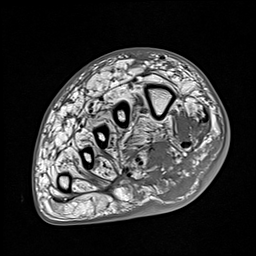
[im 44/44]
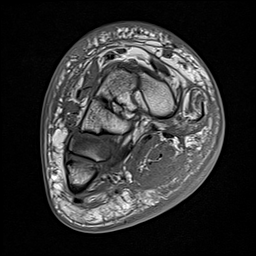

[Series 4: T2 fat-sat · coronal · right · 3.0mm · 0.38mm/px · 5 of 44 slices shown (1 of 2)]
[im 1/44]
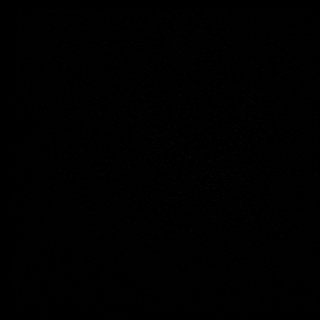
[im 11/44]
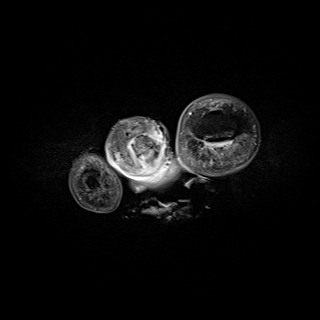
[im 22/44]
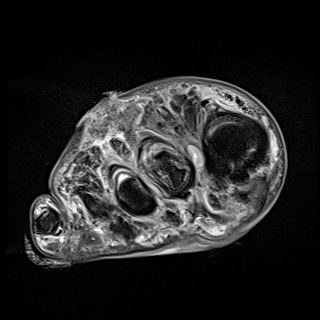
[im 33/44]
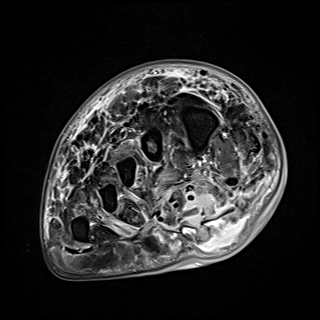
[im 44/44]
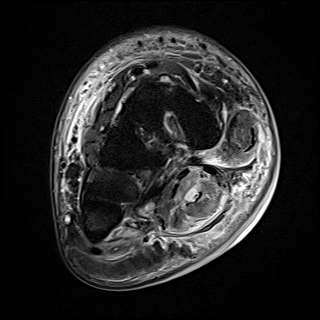

[Series 5: T2 fat-sat · axial · right · 3.0mm · 0.70mm/px · z∈[-50,+51]mm · 4 of 30 slices shown (2 of 2)]
[im 1/30]
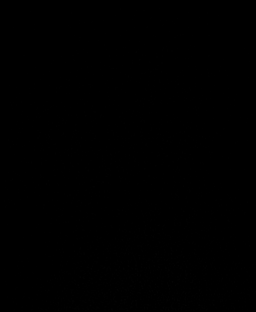
[im 10/30]
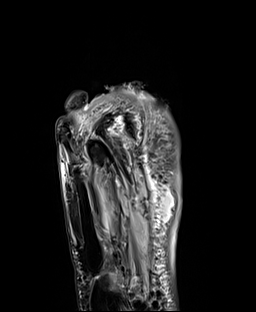
[im 20/30]
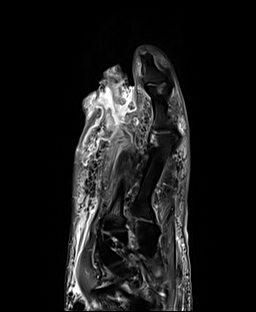
[im 30/30]
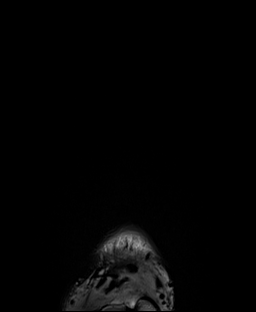

[Series 6: T1 · axial · right · 3.0mm · 0.70mm/px · z∈[-50,+51]mm · 4 of 30 slices shown (2 of 2)]
[im 1/30]
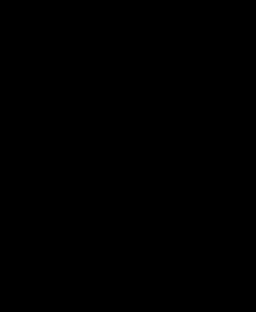
[im 10/30]
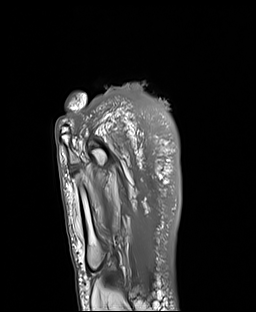
[im 20/30]
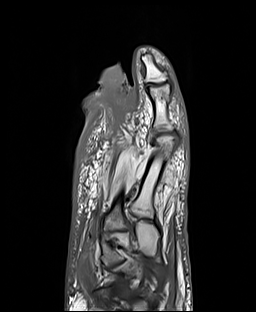
[im 30/30]
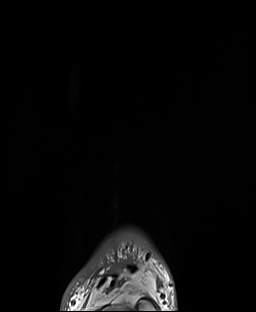

[Series 7: STIR · sagittal · right · 3.0mm · 0.35mm/px · 3 of 30 slices shown]
[im 1/30]
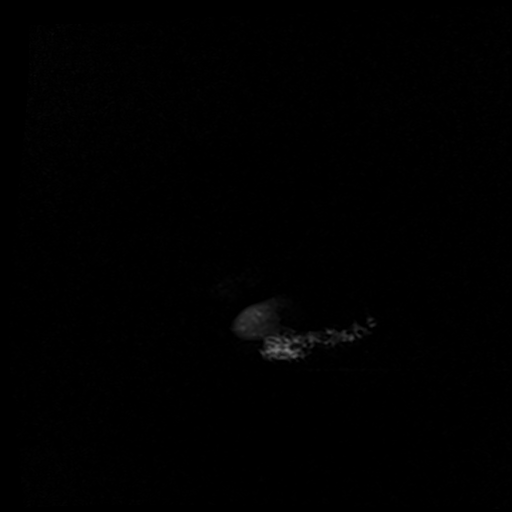
[im 10/30]
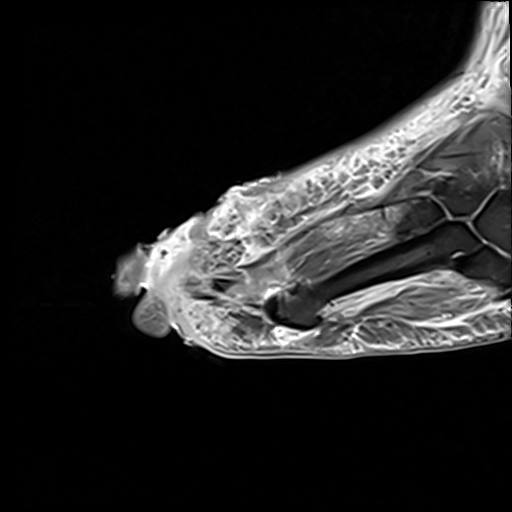
[im 20/30]
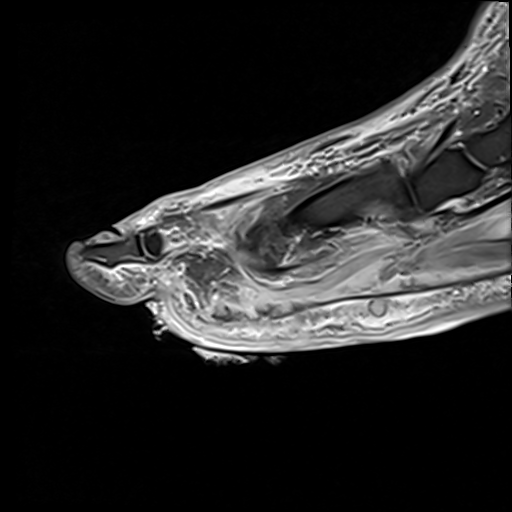

[Series 8: T1 fat-sat · coronal · non-contrast · right · 3.0mm · 0.47mm/px · 5 of 43 slices shown]
[im 1/43]
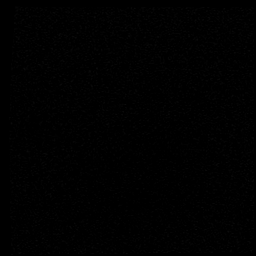
[im 11/43]
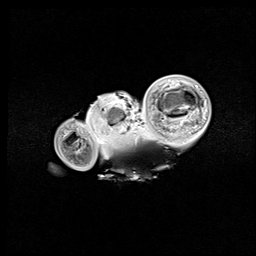
[im 22/43]
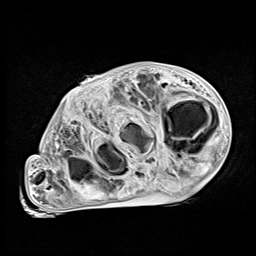
[im 32/43]
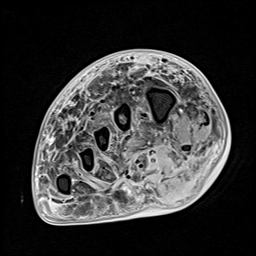
[im 43/43]
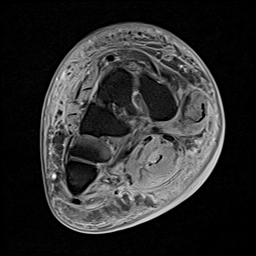

[Series 9: T1 fat-sat post-contrast · coronal · right · 3.0mm · 0.47mm/px · 5 of 43 slices shown (1 of 3)]
[im 1/43]
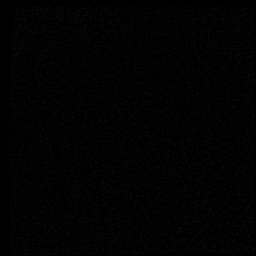
[im 11/43]
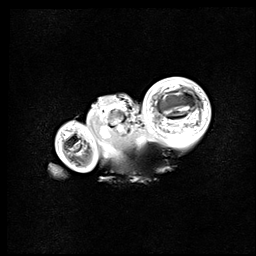
[im 22/43]
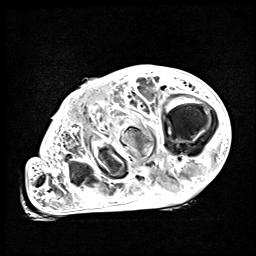
[im 32/43]
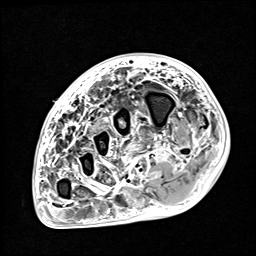
[im 43/43]
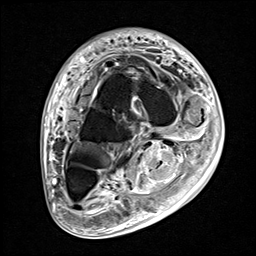

[Series 10: T1 fat-sat post-contrast · sagittal · right · 3.0mm · 0.35mm/px · 4 of 30 slices shown (2 of 3)]
[im 1/30]
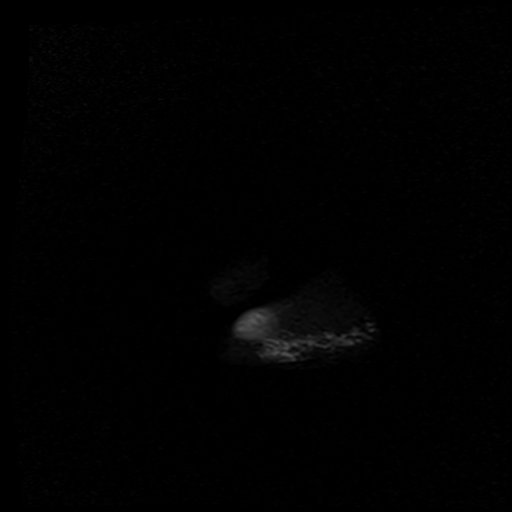
[im 10/30]
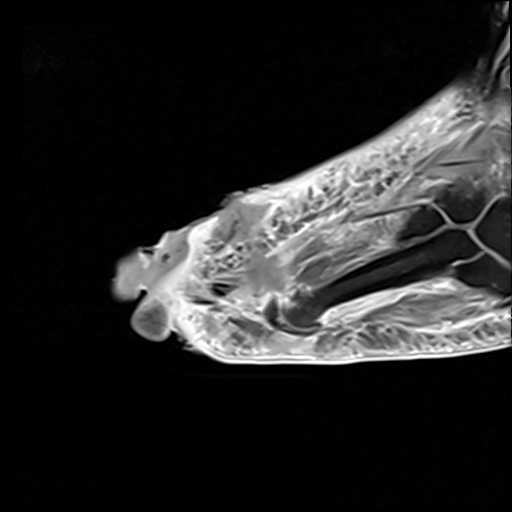
[im 20/30]
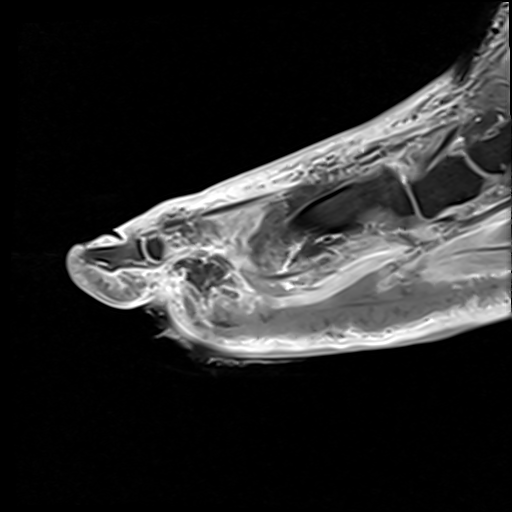
[im 30/30]
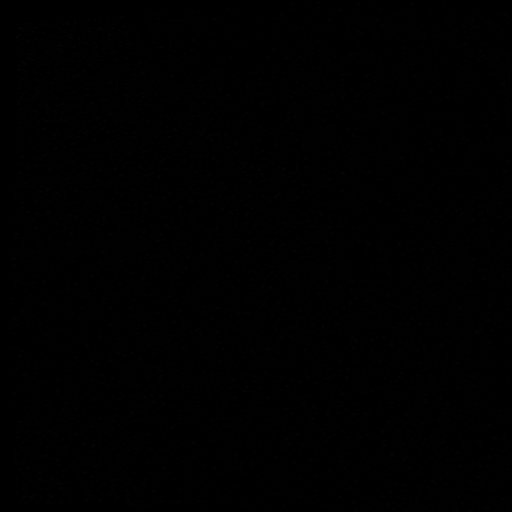

[Series 11: T1 fat-sat post-contrast · axial · right · 3.0mm · 0.56mm/px · z∈[-46,+55]mm · 4 of 30 slices shown (3 of 3)]
[im 1/30]
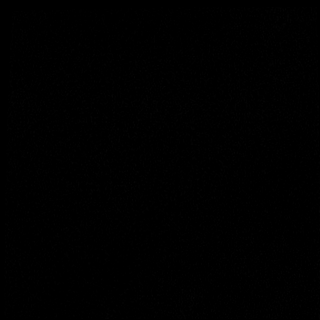
[im 10/30]
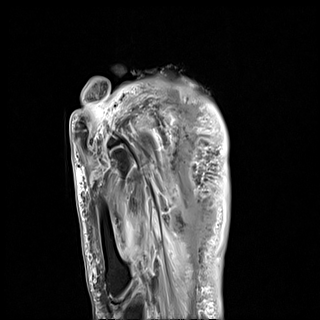
[im 20/30]
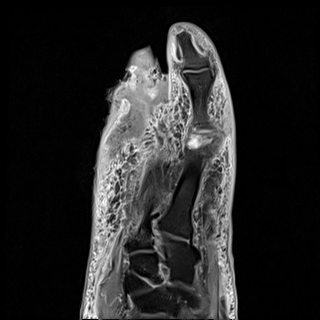
[im 30/30]
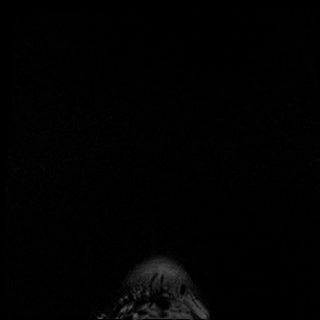

[39 of 40 positions shown; findings below may reference images not displayed]

FINDINGS: Bones/Joint/Cartilage

Abnormal marrow edema compatible with osteomyelitis involving the
phalanges of the second toe and the second metatarsal, extending in
the metatarsal back to the proximal metaphysis. There is probably a
small erosion along the lateral margin of the proximal metaphysis of
the second metatarsal.

Degenerative findings along the Lisfranc joint. No other compelling
regions for osteomyelitis in the forefoot.

Ligaments

Lisfranc ligament unremarkable.

Muscles and Tendons

Abscess and myositis tracking in the flexor digitorum brevis muscle
as shown previously. Low-grade edema and enhancement tracking along
the interosseous muscles and the abductor hallucis, potentially
reflecting mild myositis.

Soft tissues

There is gas in the soft tissues of the second toe with extensive
edema and hypoenhancement compatible with necrotic tissue and soft
tissue infection which tracks back along the base of the second toe
and to a lesser extent in the dorsum of the foot adjacent to the
second toe. There is also abnormal hypoenhancement of tissues along
the plantar subcutaneous foot back from the second toe and extending
along and proximally 11 cm excursion, necrotic tissue and soft
tissue infection in this vicinity is not excluded and there is an
associated subcutaneous fluid collection as shown on image 21 of
series 7, with ill-defined margins, but measuring about 8 cc.

Extensive subcutaneous edema circumferentially in the forefoot
compatible with cellulitis. This extends into all of the toes.
IMPRESSION: 1. Osteomyelitis of the phalanges and metatarsal of the second ray,
with soft tissue infection in the second toe with locules of gas and
hypoenhancement favoring necrotic tissue. Some of this extends back
to the ball of the foot in the second toe, along with abnormal
subcutaneous collection potentially reflecting abscess measuring
about 8 cc in volume along the plantar foot.
2. Abscess in the dorsal flexor hallucis brevis muscle as noted
previously, with questionable connectivity to the plantar
subcutaneous fluid and debris collection. Regional myositis noted
along with extensive cellulitis of the forefoot and toes.

## 2020-12-17 MED ORDER — PROPOFOL 1000 MG/100ML IV EMUL
INTRAVENOUS | Status: AC
  Filled 2020-12-17: qty 100

## 2020-12-17 MED ORDER — PROCHLORPERAZINE EDISYLATE 10 MG/2ML IJ SOLN
10.0000 mg | INTRAMUSCULAR | Status: DC | PRN
  Administered 2020-12-17: 14:00:00 10 mg via INTRAVENOUS
  Filled 2020-12-17: qty 2

## 2020-12-17 MED ORDER — VANCOMYCIN HCL 1000 MG IV SOLR
INTRAVENOUS | Status: AC
  Filled 2020-12-17: qty 20

## 2020-12-17 MED ORDER — MIDAZOLAM HCL 2 MG/2ML IJ SOLN
INTRAMUSCULAR | Status: AC
  Filled 2020-12-17: qty 2

## 2020-12-17 MED ORDER — CHLORHEXIDINE GLUCONATE 4 % EX LIQD
60.0000 mL | Freq: Once | CUTANEOUS | Status: DC
  Filled 2020-12-17: qty 15

## 2020-12-17 MED ORDER — ENOXAPARIN SODIUM 80 MG/0.8ML IJ SOSY: 70.0000 mg | PREFILLED_SYRINGE | INTRAMUSCULAR | Status: DC

## 2020-12-17 MED ORDER — FENTANYL CITRATE (PF) 100 MCG/2ML IJ SOLN
INTRAMUSCULAR | Status: AC
  Filled 2020-12-17: qty 2

## 2020-12-17 MED ORDER — POVIDONE-IODINE 10 % EX SWAB
2.0000 "application " | Freq: Once | CUTANEOUS | Status: AC
  Administered 2020-12-18: 2 via TOPICAL

## 2020-12-17 MED ORDER — ENOXAPARIN SODIUM 40 MG/0.4ML IJ SOSY
40.0000 mg | PREFILLED_SYRINGE | INTRAMUSCULAR | Status: DC
  Administered 2020-12-17: 18:00:00 40 mg via SUBCUTANEOUS
  Filled 2020-12-17: qty 0.4

## 2020-12-17 MED ORDER — PROPOFOL 10 MG/ML IV BOLUS
INTRAVENOUS | Status: AC
  Filled 2020-12-17: qty 20

## 2020-12-17 MED ORDER — GADOBUTROL 1 MMOL/ML IV SOLN
10.0000 mL | Freq: Once | INTRAVENOUS | Status: AC | PRN
  Administered 2020-12-17: 13:00:00 10 mL via INTRAVENOUS

## 2020-12-17 NOTE — Progress Notes (Signed)
Attempted calling MRI to notify of patients MRI being changed to Stat but no answer.

## 2020-12-17 NOTE — Consult Note (Addendum)
Patient ID: MAYSON STERBENZ MRN: [id number] DOB/AGE: 1980/03/29 40 y.o.  Admit date: 12/16/2020  Admission Diagnoses:  Principal Problem:   Osteomyelitis of second toe of right foot (HCC) Active Problems:   Type 2 diabetes mellitus with hyperglycemia (HCC)   Class 3 obesity (HCC)   Hyperlipidemia   HPI: The patient is currently admitted to [facility]. She has right foot pain for the past several months with acute worsening since late November 2022. She cannot recall specific injury. She is an uncontrolled diabetic with HgbA1c >13. Her PMH also significant for BMI 45, HLD. She has noticed right 2nd toe blackish appearance for past 1 week.   Past Medical History: Past Medical History:  Diagnosis Date   Class 3 obesity (HCC) 12/16/2020   Diabetes mellitus     Surgical History: Past Surgical History:  Procedure Laterality Date   EXTREMITY CYST EXCISION      Family History: Family History  Problem Relation Age of Onset   Hypertension Mother    Diabetes Mother    Diabetes Other     Social History: Social History   Socioeconomic History   Marital status: Single    Spouse name: Not on file   Number of children: Not on file   Years of education: Not on file   Highest education level: Not on file  Occupational History   Not on file  Tobacco Use   Smoking status: Never   Smokeless tobacco: Not on file  Vaping Use   Vaping Use: Never used  Substance and Sexual Activity   Alcohol use: Yes    Comment: occ   Drug use: Yes    Types: Marijuana   Sexual activity: Not on file  Other Topics Concern   Not on file  Social History Narrative   Not on file   Social Determinants of Health   Financial Resource Strain: Not on file  Food Insecurity: Not on file  Transportation Needs: Not on file  Physical Activity: Not on file  Stress: Not on file  Social Connections: Not on file  Intimate Partner Violence: Not on file    Allergies: Mangifera  indica  Medications: I have reviewed the patient's current medications.  Vital Signs: Patient Vitals for the past 24 hrs:  BP Temp Temp src Pulse Resp SpO2 Height Weight  12/17/20 0446 118/75 98.8 F (37.1 C) Oral 94 16 97 % -- --  12/17/20 0050 129/84 99.5 F (37.5 C) Oral 96 16 100 % -- --  12/16/20 2048 138/86 99.1 F (37.3 C) Oral 86 18 100 % -- --  12/16/20 1740 (!) 105/57 98 F (36.7 C) Axillary 81 18 98 % -- --  12/16/20 1358 -- -- -- -- -- -- 5\' 9"  (1.753 m) (!) 138.9 kg  12/16/20 1304 119/74 -- -- 88 18 97 % -- --  12/16/20 1200 116/85 -- -- 91 14 99 % -- --  12/16/20 1124 -- 98.1 F (36.7 C) Oral -- -- -- -- --  12/16/20 1100 117/75 -- -- 89 (!) 21 93 % -- --    Radiology: DG Foot Complete Right  Result Date: 12/16/2020 CLINICAL DATA:  40 year old female with right foot infection, fever. Diabetes. EXAM: RIGHT FOOT COMPLETE - 3+ VIEW COMPARISON:  None. FINDINGS: Soft tissue swelling throughout the distal foot, maximal at the 2nd toe with gaping soft tissue ulceration or less likely soft tissue gas. Destruction of the 2nd distal phalanx, and the distal half of the middle phalanx. Destroyed  2nd D IP. Bone mineralization elsewhere in the right foot maintained. Extensive dystrophic calcifications in the soft tissues of the distal leg. IMPRESSION: Positive for Osteomyelitis of the 2nd toe distal AND middle phalanges. Distal phalanx almost completely eroded. Electronically Signed   By: Odessa Fleming M.D.   On: 12/16/2020 04:17    Labs: Recent Labs    12/16/20 0345 12/17/20 0425  WBC 19.9* 18.1*  RBC 4.74 4.18  HCT 39.0 35.0*  PLT 444* 351   Recent Labs    12/16/20 0345 12/17/20 0425  NA 128* 129*  K 4.3 3.8  CL 87* 97*  CO2 22 22  BUN 15 17  CREATININE 0.87 0.84  GLUCOSE 393* 231*  CALCIUM 8.9 8.2*   No results for input(s): LABPT, INR in the last 72 hours.  Review of Systems: ROS  Physical Exam: Body mass index is 45.22 kg/m.  Physical Exam  Gen: AAOx3,  NAD  Right lower extremity: Diffuse swelling and erythema of forefoot Plantar bullae through midfoot, potentially purulent Right 2nd toe necrotic with loss of toe nail Frank purulence from dorsal forefoot Wiggles toes SILT over toes DP, PT 2+ to palpation CR<2s   Assessment and Plan:  40 yr old female with uncontrolled diabetes (HgbA1c >13), poor nutrition (Prealbumin <5) presenting with right foot cellulitis, necrotic right 2nd toe with acute worsening over past 10 days  -MRI of the right foot and ankle wwo contrast STAT -optimize glycemic control and nutrition consult during admission -discussed with patient that she will need right foot amputation, likely transmetatarsal amputation or potentially more proximal (pending MRI). She understands and is agreeable to consent for amputation -NPO since MN 12/17/20 for potential OR today vs tomorrow -hold Lovenox pending MRI for potential OR today  The risks and benefits were presented and reviewed. The risks due to new/persistent infection, stiffness, nerve/vessel/tendon injury, nonunion/malunion, wound healing issues, development of arthritis, failure of this surgery, possibility of staged closure with delayed definitive surgery, need for further surgery including serial debridments, thromboembolic events, anesthesia/medical complications, further amputation, death among others were discussed. The patient acknowledged the explanation, agreed to proceed with the plan.   Netta Cedars, MD Orthopaedic Surgeon [facility] [phone]

## 2020-12-17 NOTE — Care Plan (Signed)
PLAN OF CARE  -pt underwent MRI of the right foot and ankle wwo contrast today -imaging demonstrated right foot 2nd ray osteomyelitis with surrounding dorsal and plantar abscesses extending proximally along flexor tendons to region plantar to navicular -findings reviewed with interpreting radiologist  -will plan for OR tomorrow 12/18/20 pending further review of imaging and ABIs to finalize preoperative plan (either right 2nd ray amp vs TMA vs other with primary or staged closure) -pt remains stable on RNF with ongoing IV abx  -please keep NPO from midnight tonight 0000h 12/18/20 for OR Monday early afternoon  Netta Cedars, MD Orthopaedic Surgery [facility]

## 2020-12-17 NOTE — Anesthesia Preprocedure Evaluation (Deleted)
Anesthesia Evaluation    Airway        Dental   Pulmonary           Cardiovascular      Neuro/Psych    GI/Hepatic   Endo/Other  diabetesMorbid obesity  Renal/GU      Musculoskeletal   Abdominal   Peds  Hematology   Anesthesia Other Findings   Reproductive/Obstetrics                             Anesthesia Physical Anesthesia Plan Anesthesia Quick Evaluation

## 2020-12-17 NOTE — Progress Notes (Signed)
Inpatient Diabetes Program Recommendations  AACE/ADA: New Consensus Statement on Inpatient Glycemic Control (2015)  Target Ranges:  Prepandial:   less than 140 mg/dL      Peak postprandial:   less than 180 mg/dL (1-2 hours)      Critically ill patients:  140 - 180 mg/dL   Lab Results  Component Value Date   GLUCAP 233 (H) 12/17/2020   HGBA1C 13.5 (H) 12/16/2020    Review of Glycemic Control  Diabetes history: DM2 Outpatient Diabetes medications: None Current orders for Inpatient glycemic control: Novolog 0-20 units TID with meals and 0-5 HS  HgbA1C - 13.5% Plan for OR tomorrow, will keep NPO CBGs today: 226, 233 mg/dL  Inpatient Diabetes Program Recommendations:    Add Semglee 15 units QHS  Will need to be discharged on insulin. Will see pt after surgery.  Thank you. Ailene Ards, RD, LDN, CDE Inpatient Diabetes Coordinator [phone]

## 2020-12-17 NOTE — Progress Notes (Signed)
PROGRESS NOTE    [name]  [id number] DOB: September 01, 1980 DOA: 12/16/2020 PCP: Pcp, No    Brief Narrative:  40 y.o. female with medical history significant of class III obesity, hyperlipidemia, type 2 diabetes currently not on medical therapy who is coming from [place] emergency department where she presented with that week history of worsening chronic toe ulcer, which she has had for the past year plus, now coming with increased pain, development of purulent discharge and discoloration of second toe.  She does not remember having any trauma to the area.  No fever, chills, but feels fatigue.  No rhinorrhea, sore throat, wheezing, dyspnea or hemoptysis.  No chest pain, palpitations, diaphoresis, PND, orthopnea or pitting edema of the lower extremities.  She has some nausea and vomiting earlier today.  No abdominal pain, diarrhea, constipation, melena or hematochezia.  No dysuria, frequency or hematuria.  She does not check her blood glucose regularly and has not been under treatment recently.  She denied polyuria, polydipsia, polyphagia or blurred vision.  She does not have a PCP currently. Pt was admitted for concerns of osteomyelitis of R foot  Assessment & Plan:   Principal Problem:   Osteomyelitis of second toe of right foot ([place]) Active Problems:   Type 2 diabetes mellitus with hyperglycemia (HCC)   Class 3 obesity (HCC)   Hyperlipidemia  Diabetes. EXAM: RIGHT FOOT COMPLETE - 3+ VIEW COMPARISON:  None. FINDINGS: Soft tissue swelling throughout the distal foot, maximal at the 2nd toe with gaping soft tissue ulceration or less likely soft tissue gas. Destruction of the 2nd distal phalanx, and the distal half of the middle phalanx. Destroyed 2nd D IP. Bone mineralization elsewhere in the right foot maintained. Extensive dystrophic calcifications in the soft tissues of the distal leg. IMPRESSION: Positive for Osteomyelitis of the 2nd toe distal AND middle phalanges. Distal  phalanx almost completely eroded. Electronically Signed   By: Genevie Ann M.D.   On: 12/16/2020 04:17       EKG: Independently reviewed.  Vent. rate 96 BPM PR interval 145 ms QRS duration 80 ms QT/QTcB 342/433 ms P-R-T axes 67 40 33 Sinus rhythm Consider anterior infarct   Assessment/Plan Principal Problem:   Osteomyelitis of second toe of right foot [facility]) Orthopedic Surgery consulted Analgesics as needed. Antiemetics as needed. Cefepime with vanc ordered at time of presentation MRI R foot reviewed, findings notable for osteomyelitis of the phalanges and metatarsal of the 2nd ray with soft tissue infection. Abscess in the dorsal flexor hallucis brevis with regional myositis and extensive cellulitis of forefoot and toes -Pt is planned for OR tomorrow pending ABI's   Active Problems:   Type 2 diabetes mellitus with hyperglycemia (HCC) Continue IV fluids. CBG monitoring with RI SS. A1c of 13.5. Pt admits to medical and dietary noncompliance     Class 3 obesity (HCC) Lifestyle modifications. Needs to establish with PCP.     Hyperlipidemia Currently not on medical therapy. Lifestyle modifications and establishment with PCP.   DVT prophylaxis: Lovenox subq Code Status: Full Family Communication: Pt in room, family not at bedside  Status is: Inpatient  Remains inpatient appropriate because: severity of illness    Consultants:  Orthopedic Surgery  Procedures:    Antimicrobials: Anti-infectives (From admission, onward)    Start     Dose/Rate Route Frequency Ordered Stop   12/17/20 0800  vancomycin (VANCOREADY) IVPB 1500 mg/300 mL        1,500 mg 150 mL/hr over 120 Minutes  Intravenous Every 12 hours 12/16/20 1747     12/16/20 2000  ceFEPIme (MAXIPIME) 2 g in sodium chloride 0.9 % 100 mL IVPB        2 g 200 mL/hr over 30 Minutes Intravenous Every 8 hours 12/16/20 1735     12/16/20 1845  vancomycin (VANCOREADY) IVPB 2000 mg/400 mL        2,000 mg 200 mL/hr over 120  Minutes Intravenous  Once 12/16/20 1747 12/16/20 2115   12/16/20 0430  vancomycin (VANCOCIN) IVPB 1000 mg/200 mL premix        1,000 mg 200 mL/hr over 60 Minutes Intravenous  Once 12/16/20 0423 12/16/20 0636       Subjective: Complained of nausea this AM  Objective: Vitals:   12/16/20 2048 12/17/20 0050 12/17/20 0446 12/17/20 1314  BP: 138/86 129/84 118/75 112/65  Pulse: 86 96 94 81  Resp: 18 16 16 20   Temp: 99.1 F (37.3 C) 99.5 F (37.5 C) 98.8 F (37.1 C) 98.5 F (36.9 C)  TempSrc: Oral Oral Oral   SpO2: 100% 100% 97% 100%  Weight:      Height:        Intake/Output Summary (Last 24 hours) at 12/17/2020 1624 Last data filed at 12/17/2020 [id number] Gross per 24 hour  Intake 3196.46 ml  Output 1450 ml  Net 1746.46 ml   Filed Weights   12/16/20 1358  Weight: (!) 138.9 kg    Examination: General exam: Awake, laying in bed, in nad Respiratory system: Normal respiratory effort, no wheezing Cardiovascular system: regular rate, s1, s2 Gastrointestinal system: Soft, nondistended, positive BS Central nervous system: CN2-12 grossly intact, strength intact Extremities: No clubbing, RLE with dressings in place, soaked in blood Skin: Normal skin turgor, no rashes Psychiatry: Mood normal // no visual hallucinations    Data Reviewed: I have personally reviewed following labs and imaging studies  CBC: Recent Labs  Lab 12/16/20 0345 12/17/20 0425  WBC 19.9* 18.1*  NEUTROABS 16.3*  --   HGB 12.7 11.5*  HCT 39.0 35.0*  MCV 82.3 83.7  PLT 444* [id number]   Basic Metabolic Panel: Recent Labs  Lab 12/16/20 0345 12/17/20 0425  NA 128* 129*  K 4.3 3.8  CL 87* 97*  CO2 22 22  GLUCOSE 393* 231*  BUN 15 17  CREATININE 0.87 0.84  CALCIUM 8.9 8.2*   GFR: Estimated Creatinine Clearance: 135.3 mL/min (by C-G formula based on SCr of 0.84 mg/dL). Liver Function Tests: Recent Labs  Lab 12/16/20 0345 12/17/20 0425  AST 13* 18  ALT 9 10  ALKPHOS 112 86  BILITOT 0.9 0.9  PROT  8.6* 7.2  ALBUMIN 2.6* 2.3*   No results for input(s): LIPASE, AMYLASE in the last 168 hours. No results for input(s): AMMONIA in the last 168 hours. Coagulation Profile: No results for input(s): INR, PROTIME in the last 168 hours. Cardiac Enzymes: No results for input(s): CKTOTAL, CKMB, CKMBINDEX, TROPONINI in the last 168 hours. BNP (last 3 results) No results for input(s): PROBNP in the last 8760 hours. HbA1C: Recent Labs    12/16/20 0752  HGBA1C 13.5*   CBG: Recent Labs  Lab 12/16/20 1640 12/16/20 2050 12/17/20 0736 12/17/20 1249 12/17/20 1614  GLUCAP 231* 203* 226* 233* 191*   Lipid Profile: No results for input(s): CHOL, HDL, LDLCALC, TRIG, CHOLHDL, LDLDIRECT in the last 72 hours. Thyroid Function Tests: No results for input(s): TSH, T4TOTAL, FREET4, T3FREE, THYROIDAB in the last 72 hours. Anemia Panel: No results for input(s): VITAMINB12, FOLATE, FERRITIN,  TIBC, IRON, RETICCTPCT in the last 72 hours. Sepsis Labs: No results for input(s): PROCALCITON, LATICACIDVEN in the last 168 hours.  Recent Results (from the past 240 hour(s))  Blood culture (routine x 2)     Status: None (Preliminary result)   Collection Time: 12/16/20  4:15 AM   Specimen: BLOOD  Result Value Ref Range Status   Specimen Description   Final    BLOOD RIGHT ANTECUBITAL Performed at [facility], [address] [place], [place] [address]    Special Requests   Final    BOTTLES DRAWN AEROBIC AND ANAEROBIC Blood Culture results may not be optimal due to an inadequate volume of blood received in culture bottles Performed at [facility], [address] [place], [place] [address]    Culture   Final    NO GROWTH < 24 HOURS Performed at [facility], [address], [place], [place] [address]    Report Status PENDING  Incomplete  Blood culture (routine x 2)     Status: None (Preliminary result)   Collection Time: 12/16/20  4:20 AM   Specimen: BLOOD  Result Value  Ref Range Status   Specimen Description   Final    BLOOD RIGHT FOREARM Performed at [facility], [address] [place], [place] [address]    Special Requests   Final    BOTTLES DRAWN AEROBIC AND ANAEROBIC Blood Culture results may not be optimal due to an inadequate volume of blood received in culture bottles Performed at [facility], [address] [place], [place] [address]    Culture   Final    NO GROWTH < 24 HOURS Performed at [facility], [address], [place], [place] [address]    Report Status PENDING  Incomplete  Resp Panel by RT-PCR (Flu A&B, Covid) Nasopharyngeal Swab     Status: None   Collection Time: 12/16/20  5:20 AM   Specimen: Nasopharyngeal Swab; Nasopharyngeal(NP) swabs in vial transport medium  Result Value Ref Range Status   SARS Coronavirus 2 by RT PCR NEGATIVE NEGATIVE Final    Comment: (NOTE) SARS-CoV-2 target nucleic acids are NOT DETECTED.  The SARS-CoV-2 RNA is generally detectable in upper respiratory specimens during the acute phase of infection. The lowest concentration of SARS-CoV-2 viral copies this assay can detect is 138 copies/mL. A negative result does not preclude SARS-Cov-2 infection and should not be used as the sole basis for treatment or other patient management decisions. A negative result may occur with  improper specimen collection/handling, submission of specimen other than nasopharyngeal swab, presence of viral mutation(s) within the areas targeted by this assay, and inadequate number of viral copies(<138 copies/mL). A negative result must be combined with clinical observations, patient history, and epidemiological information. The expected result is Negative.  Fact Sheet for Patients:  BloggerCourse.com  Fact Sheet for Healthcare Providers:  SeriousBroker.it  This test is no t yet approved or cleared by the [place] FDA and  has been  authorized for detection and/or diagnosis of SARS-CoV-2 by FDA under an Emergency Use Authorization (EUA). This EUA will remain  in effect (meaning this test can be used) for the duration of the COVID-19 declaration under Section 564(b)(1) of the Act, 21 U.S.C.section 360bbb-3(b)(1), unless the authorization is terminated  or revoked sooner.       Influenza A by PCR NEGATIVE NEGATIVE Final   Influenza B by PCR NEGATIVE NEGATIVE Final  Comment: (NOTE) The Xpert Xpress SARS-CoV-2/FLU/RSV plus assay is intended as an aid in the diagnosis of influenza from Nasopharyngeal swab specimens and should not be used as a sole basis for treatment. Nasal washings and aspirates are unacceptable for Xpert Xpress SARS-CoV-2/FLU/RSV testing.  Fact Sheet for Patients: BloggerCourse.com  Fact Sheet for Healthcare Providers: SeriousBroker.it  This test is not yet approved or cleared by the [place] FDA and has been authorized for detection and/or diagnosis of SARS-CoV-2 by FDA under an Emergency Use Authorization (EUA). This EUA will remain in effect (meaning this test can be used) for the duration of the COVID-19 declaration under Section 564(b)(1) of the Act, 21 U.S.C. section 360bbb-3(b)(1), unless the authorization is terminated or revoked.  Performed at [facility], [address], [place], [place] [address]   Surgical pcr screen     Status: None   Collection Time: 12/17/20  1:16 PM   Specimen: Nasal Mucosa; Nasal Swab  Result Value Ref Range Status   MRSA, PCR NEGATIVE NEGATIVE Final   Staphylococcus aureus NEGATIVE NEGATIVE Final    Comment: (NOTE) The Xpert SA Assay (FDA approved for NASAL specimens in patients 108 years of age and older), is one component of a comprehensive surveillance program. It is not intended to diagnose infection nor to guide or monitor treatment. Performed at [facility],  [address] [place], [place] [address]      Radiology Studies: MR FOOT RIGHT W WO CONTRAST  Result Date: 12/17/2020 CLINICAL DATA:  Osteomyelitis of the foot EXAM: MRI OF THE RIGHT FOREFOOT WITHOUT AND WITH CONTRAST TECHNIQUE: Multiplanar, multisequence MR imaging of the right forefoot was performed before and after the administration of intravenous contrast. CONTRAST:  10 cc Gadavist COMPARISON:  Radiographs 12/16/2020 FINDINGS: Bones/Joint/Cartilage Abnormal marrow edema compatible with osteomyelitis involving the phalanges of the second toe and the second metatarsal, extending in the metatarsal back to the proximal metaphysis. There is probably a small erosion along the lateral margin of the proximal metaphysis of the second metatarsal. Degenerative findings along the Lisfranc joint. No other compelling regions for osteomyelitis in the forefoot. Ligaments Lisfranc ligament unremarkable. Muscles and Tendons Abscess and myositis tracking in the flexor digitorum brevis muscle as shown previously. Low-grade edema and enhancement tracking along the interosseous muscles and the abductor hallucis, potentially reflecting mild myositis. Soft tissues There is gas in the soft tissues of the second toe with extensive edema and hypoenhancement compatible with necrotic tissue and soft tissue infection which tracks back along the base of the second toe and to a lesser extent in the dorsum of the foot adjacent to the second toe. There is also abnormal hypoenhancement of tissues along the plantar subcutaneous foot back from the second toe and extending along and proximally 11 cm excursion, necrotic tissue and soft tissue infection in this vicinity is not excluded and there is an associated subcutaneous fluid collection as shown on image 21 of series 7, with ill-defined margins, but measuring about 8 cc. Extensive subcutaneous edema circumferentially in the forefoot compatible with cellulitis. This extends into all of  the toes. IMPRESSION: 1. Osteomyelitis of the phalanges and metatarsal of the second ray, with soft tissue infection in the second toe with locules of gas and hypoenhancement favoring necrotic tissue. Some of this extends back to the ball of the foot in the second toe, along with abnormal subcutaneous collection potentially reflecting abscess measuring about 8 cc in volume along the plantar foot. 2. Abscess in the  dorsal flexor hallucis brevis muscle as noted previously, with questionable connectivity to the plantar subcutaneous fluid and debris collection. Regional myositis noted along with extensive cellulitis of the forefoot and toes. Electronically Signed   By: Van Clines M.D.   On: 12/17/2020 13:07   MR ANKLE RIGHT W WO CONTRAST  Result Date: 12/17/2020 CLINICAL DATA:  Diabetes, foot infection, preoperative planning. EXAM: MRI OF THE RIGHT ANKLE WITHOUT AND WITH CONTRAST TECHNIQUE: Multiplanar, multisequence MR imaging of the ankle was performed before and after the administration of intravenous contrast. CONTRAST:  93mL GADAVIST GADOBUTROL 1 MMOL/ML IV SOLN COMPARISON:  Radiographs 12/16/2020 FINDINGS: TENDONS Peroneal: Unremarkable Posteromedial: Moderate distal tibialis posterior tendinopathy. Anterior: Unremarkable Achilles: Unremarkable Plantar Fascia: No substantial thickening of the plantar fascia. LIGAMENTS Lateral: Unremarkable Medial: Unremarkable CARTILAGE Ankle Joint: 3 mm non-fragmented osteochondral lesion of the medial talar dome, image 21 series 4. Subtalar Joints/Sinus Tarsi: Unremarkable Bones: Marrow edema in the shaft of the second metatarsal compatible osteomyelitis based on the foot MRI appearance. Other: 1.1 by 0.7 by 6.4 cm (volume = 3 cm^3) fluid signal intensity collection with enhancing margins tracking within along the dorsal portion of the flexor digitorum brevis muscle as shown on image 29 series 10. Appearance compatible with abscess. There is abnormal edema and  enhancement in the flexor digitorum brevis muscle favoring myositis, and low-level edema tracking in the abductor hallucis muscle potentially also from myositis. Dorsal subcutaneous edema and enhancement along the ankle favoring cellulitis. IMPRESSION: IMPRESSION 1. 3 cc abscess tracking within the dorsal portion of the flexor digitorum brevis muscle. Myositis involving the flexor digitorum brevis and abductor hallucis muscles. 2. Marrow edema in the shaft of the second metatarsal compatible with osteomyelitis based on the foot MRI appearance. 3. 3 mm non-fragmented osteochondral lesion of the medial talar dome. 4. Moderate distal tibialis posterior tendinopathy. 5. Dorsal cellulitis along the ankle. Electronically Signed   By: Van Clines M.D.   On: 12/17/2020 13:00   DG Foot Complete Right  Result Date: 12/16/2020 CLINICAL DATA:  40 year old female with right foot infection, fever. Diabetes. EXAM: RIGHT FOOT COMPLETE - 3+ VIEW COMPARISON:  None. FINDINGS: Soft tissue swelling throughout the distal foot, maximal at the 2nd toe with gaping soft tissue ulceration or less likely soft tissue gas. Destruction of the 2nd distal phalanx, and the distal half of the middle phalanx. Destroyed 2nd D IP. Bone mineralization elsewhere in the right foot maintained. Extensive dystrophic calcifications in the soft tissues of the distal leg. IMPRESSION: Positive for Osteomyelitis of the 2nd toe distal AND middle phalanges. Distal phalanx almost completely eroded. Electronically Signed   By: Genevie Ann M.D.   On: 12/16/2020 04:17   VAS [place] ABI WITH/WO TBI  Result Date: 12/17/2020  LOWER EXTREMITY DOPPLER STUDY Patient Name:  [name]  Date of Exam:   12/17/2020 Medical Rec #: [id number]       Accession #:    [id number] Date of Birth: Apr 26, 1980       Patient Gender: F Patient Age:   40 years Exam Location:  [facility] Procedure:      VAS [place] ABI WITH/WO TBI Referring Phys: Armond Hang  --------------------------------------------------------------------------------  Indications: Ulceration, and osteomyelitis of the second toe distal and middle              phalanges. Distal phalanx is almost completely eroded. High Risk Factors: Diabetes. Other Factors: Not on medical therapy.  Limitations: Today's exam was limited due to bandages and tachycardia. Comparison Study:  No prior study Performing Technologist: Darlin Coco  Examination Guidelines: A complete evaluation includes at minimum, Doppler waveform signals and systolic blood pressure reading at the level of bilateral brachial, anterior tibial, and posterior tibial arteries, when vessel segments are accessible. Bilateral testing is considered an integral part of a complete examination. Photoelectric Plethysmograph (PPG) waveforms and toe systolic pressure readings are included as required and additional duplex testing as needed. Limited examinations for reoccurring indications may be performed as noted.  ABI Findings: +---------+------------------+-----+-----------+-------------------+ Right    Rt Pressure (mmHg)IndexWaveform   Comment             +---------+------------------+-----+-----------+-------------------+ Brachial 137                    multiphasic                    +---------+------------------+-----+-----------+-------------------+ PTA      156               1.11 multiphasic                    +---------+------------------+-----+-----------+-------------------+ DP       152               1.08 multiphasic                    +---------+------------------+-----+-----------+-------------------+ Great Toe                                  ulceration/bandages +---------+------------------+-----+-----------+-------------------+ +---------+------------------+-----+-----------+-------+ Left     Lt Pressure (mmHg)IndexWaveform   Comment +---------+------------------+-----+-----------+-------+ Brachial 141                     multiphasic        +---------+------------------+-----+-----------+-------+ PTA      155               1.10 multiphasic        +---------+------------------+-----+-----------+-------+ DP       159               1.13 multiphasic        +---------+------------------+-----+-----------+-------+ Great Toe126               0.89                    +---------+------------------+-----+-----------+-------+   Summary: Right: Resting right ankle-brachial index is within normal range. No evidence of significant right lower extremity arterial disease. Unable to attain ABI secondary to ulceration and bandages. Left: Resting left ankle-brachial index is within normal range. No evidence of significant left lower extremity arterial disease. The left toe-brachial index is normal.  *See table(s) above for measurements and observations.     Preliminary     Scheduled Meds:  chlorhexidine  60 mL Topical Once   enoxaparin (LOVENOX) injection  40 mg Subcutaneous Q24H   insulin aspart  0-20 Units Subcutaneous TID WC   insulin aspart  0-5 Units Subcutaneous QHS   ketorolac  30 mg Intravenous Q6H   povidone-iodine  2 application Topical Once   Continuous Infusions:  ceFEPime (MAXIPIME) IV 2 g (12/17/20 1357)   vancomycin 1,500 mg (12/17/20 0829)     LOS: 1 day   Marylu Lund, MD [facility] Pager On Amion  If 7PM-7AM, please contact night-coverage 12/17/2020, 4:24 PM

## 2020-12-17 NOTE — Progress Notes (Signed)
ABI study completed.  ° °Please see CV Proc for preliminary results.  ° °Rochella Benner, RDMS, RVT ° °

## 2020-12-18 ENCOUNTER — Inpatient Hospital Stay (HOSPITAL_COMMUNITY): Payer: Self-pay | Admitting: Anesthesiology

## 2020-12-18 ENCOUNTER — Encounter (HOSPITAL_COMMUNITY)
Admission: EM | Disposition: A | Discharge status: Home / Self Care | Payer: Self-pay | Source: Home / Self Care | Attending: Internal Medicine

## 2020-12-18 ENCOUNTER — Encounter (HOSPITAL_COMMUNITY): Payer: Self-pay | Admitting: Family Medicine

## 2020-12-18 DIAGNOSIS — M869 Osteomyelitis, unspecified: Secondary | ICD-10-CM | POA: Diagnosis not present

## 2020-12-18 HISTORY — PX: TRANSMETATARSAL AMPUTATION: SHX6197

## 2020-12-18 LAB — CBC
HCT: 38.5 % (ref 36.0–46.0)
Hemoglobin: 12.3 g/dL (ref 12.0–15.0)
MCH: 27.2 pg (ref 26.0–34.0)
MCHC: 31.9 g/dL (ref 30.0–36.0)
MCV: 85 fL (ref 80.0–100.0)
Platelets: 337 10*3/uL (ref 150–400)
RBC: 4.53 MIL/uL (ref 3.87–5.11)
RDW: 14.8 % (ref 11.5–15.5)
WBC: 16.1 10*3/uL — ABNORMAL HIGH (ref 4.0–10.5)
nRBC: 0 % (ref 0.0–0.2)

## 2020-12-18 LAB — GLUCOSE, CAPILLARY
Glucose-Capillary: 224 mg/dL — ABNORMAL HIGH (ref 70–99)
Glucose-Capillary: 216 mg/dL — ABNORMAL HIGH (ref 70–99)
Glucose-Capillary: 220 mg/dL — ABNORMAL HIGH (ref 70–99)
Glucose-Capillary: 228 mg/dL — ABNORMAL HIGH (ref 70–99)
Glucose-Capillary: 193 mg/dL — ABNORMAL HIGH (ref 70–99)

## 2020-12-18 LAB — PREGNANCY, URINE: Preg Test, Ur: NEGATIVE

## 2020-12-18 SURGERY — AMPUTATION, FOOT, TRANSMETATARSAL
Anesthesia: Monitor Anesthesia Care | Site: Toe | Laterality: Right

## 2020-12-18 MED ORDER — ONDANSETRON HCL 4 MG/2ML IJ SOLN
INTRAMUSCULAR | Status: DC | PRN
  Administered 2020-12-18: 4 mg via INTRAVENOUS

## 2020-12-18 MED ORDER — PROPOFOL 1000 MG/100ML IV EMUL
INTRAVENOUS | Status: AC
  Filled 2020-12-18: qty 100

## 2020-12-18 MED ORDER — MIDAZOLAM HCL 2 MG/2ML IJ SOLN
INTRAMUSCULAR | Status: AC
  Filled 2020-12-18: qty 2

## 2020-12-18 MED ORDER — FENTANYL CITRATE PF 50 MCG/ML IJ SOSY
50.0000 ug | PREFILLED_SYRINGE | INTRAMUSCULAR | Status: DC
  Administered 2020-12-18: 50 ug via INTRAVENOUS
  Filled 2020-12-18: qty 2

## 2020-12-18 MED ORDER — BUPIVACAINE HCL (PF) 0.5 % IJ SOLN
INTRAMUSCULAR | Status: AC
  Filled 2020-12-18: qty 30

## 2020-12-18 MED ORDER — 0.9 % SODIUM CHLORIDE (POUR BTL) OPTIME
TOPICAL | Status: DC | PRN
  Administered 2020-12-18: 1000 mL

## 2020-12-18 MED ORDER — INSULIN GLARGINE-YFGN 100 UNIT/ML ~~LOC~~ SOLN
15.0000 [IU] | Freq: Every day | SUBCUTANEOUS | Status: DC
  Administered 2020-12-18: 15 [IU] via SUBCUTANEOUS
  Filled 2020-12-18 (×2): qty 0.15

## 2020-12-18 MED ORDER — LACTATED RINGERS IV SOLN: INTRAVENOUS | Status: DC | PRN

## 2020-12-18 MED ORDER — PROPOFOL 500 MG/50ML IV EMUL
INTRAVENOUS | Status: DC | PRN
  Administered 2020-12-18: 75 ug/kg/min via INTRAVENOUS

## 2020-12-18 MED ORDER — PROPOFOL 10 MG/ML IV BOLUS
INTRAVENOUS | Status: DC | PRN
  Administered 2020-12-18: 200 mg via INTRAVENOUS

## 2020-12-18 MED ORDER — BACITRACIN ZINC 500 UNIT/GM EX OINT
TOPICAL_OINTMENT | CUTANEOUS | Status: DC | PRN
  Administered 2020-12-18: 1 via TOPICAL

## 2020-12-18 MED ORDER — VANCOMYCIN HCL 1000 MG IV SOLR
INTRAVENOUS | Status: DC | PRN
  Administered 2020-12-18: 1000 mg

## 2020-12-18 MED ORDER — FENTANYL CITRATE PF 50 MCG/ML IJ SOSY: 25.0000 ug | PREFILLED_SYRINGE | INTRAMUSCULAR | Status: DC | PRN

## 2020-12-18 MED ORDER — ACETAMINOPHEN 10 MG/ML IV SOLN: 1000.0000 mg | Freq: Once | INTRAVENOUS | Status: DC | PRN

## 2020-12-18 MED ORDER — MIDAZOLAM HCL 5 MG/5ML IJ SOLN
INTRAMUSCULAR | Status: DC | PRN
  Administered 2020-12-18 (×2): 1 mg via INTRAVENOUS

## 2020-12-18 MED ORDER — SODIUM CHLORIDE 0.9 % IR SOLN
Status: DC | PRN
  Administered 2020-12-18: 6000 mL

## 2020-12-18 MED ORDER — PROMETHAZINE HCL 25 MG/ML IJ SOLN: 6.2500 mg | INTRAMUSCULAR | Status: DC | PRN

## 2020-12-18 MED ORDER — ONDANSETRON HCL 4 MG/2ML IJ SOLN
INTRAMUSCULAR | Status: AC
  Filled 2020-12-18: qty 2

## 2020-12-18 MED ORDER — BACITRACIN ZINC 500 UNIT/GM EX OINT
TOPICAL_OINTMENT | CUTANEOUS | Status: AC
  Filled 2020-12-18: qty 28.35

## 2020-12-18 MED ORDER — INSULIN ASPART 100 UNIT/ML IJ SOLN
INTRAMUSCULAR | Status: AC
  Administered 2020-12-18: 7 [IU] via SUBCUTANEOUS
  Filled 2020-12-18: qty 1

## 2020-12-18 MED ORDER — INSULIN ASPART 100 UNIT/ML IJ SOLN
INTRAMUSCULAR | Status: AC
  Filled 2020-12-18: qty 1

## 2020-12-18 MED ORDER — VANCOMYCIN HCL 1000 MG IV SOLR
INTRAVENOUS | Status: AC
  Filled 2020-12-18: qty 20

## 2020-12-18 MED ORDER — MIDAZOLAM HCL 2 MG/2ML IJ SOLN
1.0000 mg | INTRAMUSCULAR | Status: DC
  Administered 2020-12-18: 1 mg via INTRAVENOUS
  Filled 2020-12-18: qty 2

## 2020-12-18 MED ORDER — CLONIDINE HCL (ANALGESIA) 100 MCG/ML EP SOLN
EPIDURAL | Status: DC | PRN
  Administered 2020-12-18: 100 ug

## 2020-12-18 MED ORDER — FENTANYL CITRATE (PF) 100 MCG/2ML IJ SOLN
INTRAMUSCULAR | Status: DC | PRN
  Administered 2020-12-18: 25 ug via INTRAVENOUS
  Administered 2020-12-18: 50 ug via INTRAVENOUS
  Administered 2020-12-18: 25 ug via INTRAVENOUS

## 2020-12-18 MED ORDER — ROPIVACAINE HCL 5 MG/ML IJ SOLN
INTRAMUSCULAR | Status: DC | PRN
  Administered 2020-12-18: 30 mL

## 2020-12-18 MED ORDER — FENTANYL CITRATE (PF) 100 MCG/2ML IJ SOLN
INTRAMUSCULAR | Status: AC
  Filled 2020-12-18: qty 2

## 2020-12-18 SURGICAL SUPPLY — 58 items
BAG COUNTER SPONGE SURGICOUNT (BAG) IMPLANT
BLADE AVERAGE 25X9 (BLADE) IMPLANT
BLADE OSCILLATING/SAGITTAL (BLADE) ×5
BLADE SURG 15 STRL LF DISP TIS (BLADE) ×7 IMPLANT
BLADE SURG 15 STRL SS (BLADE) ×7
BLADE SW THK.38XMED LNG THN (BLADE) ×5 IMPLANT
BNDG COHESIVE 4X5 TAN ST LF (GAUZE/BANDAGES/DRESSINGS) ×2 IMPLANT
BNDG ELASTIC 4X5.8 VLCR STR LF (GAUZE/BANDAGES/DRESSINGS) ×2 IMPLANT
BNDG ELASTIC 6X5.8 VLCR STR LF (GAUZE/BANDAGES/DRESSINGS) IMPLANT
BNDG ESMARK 4X9 LF (GAUZE/BANDAGES/DRESSINGS) IMPLANT
BNDG GAUZE ELAST 4 BULKY (GAUZE/BANDAGES/DRESSINGS) ×2 IMPLANT
CHLORAPREP W/TINT 26 (MISCELLANEOUS) ×6 IMPLANT
COVER BACK TABLE 60X90IN (DRAPES) IMPLANT
CUFF TOURN SGL QUICK 18X4 (TOURNIQUET CUFF) IMPLANT
CUFF TOURN SGL QUICK 42 (TOURNIQUET CUFF) ×2 IMPLANT
DRAPE EXTREMITY T 121X128X90 (DISPOSABLE) IMPLANT
DRAPE IMP U-DRAPE 54X76 (DRAPES) IMPLANT
DRAPE U-SHAPE 47X51 STRL (DRAPES) ×2 IMPLANT
DRSG ADAPTIC 3X8 NADH LF (GAUZE/BANDAGES/DRESSINGS) ×2 IMPLANT
DRSG EMULSION OIL 3X3 NADH (GAUZE/BANDAGES/DRESSINGS) IMPLANT
DRSG PAD ABDOMINAL 8X10 ST (GAUZE/BANDAGES/DRESSINGS) ×2 IMPLANT
DURAPREP 26ML APPLICATOR (WOUND CARE) IMPLANT
ELECT REM PT RETURN 15FT ADLT (MISCELLANEOUS) ×2 IMPLANT
GAUZE 4X4 16PLY ~~LOC~~+RFID DBL (SPONGE) IMPLANT
GAUZE SPONGE 4X4 12PLY STRL (GAUZE/BANDAGES/DRESSINGS) ×2 IMPLANT
GLOVE SURG ENC MOIS LTX SZ7.5 (GLOVE) ×4 IMPLANT
GOWN STRL REUS W/ TWL LRG LVL3 (GOWN DISPOSABLE) ×1 IMPLANT
GOWN STRL REUS W/ TWL XL LVL3 (GOWN DISPOSABLE) IMPLANT
GOWN STRL REUS W/TWL LRG LVL3 (GOWN DISPOSABLE) ×1
GOWN STRL REUS W/TWL XL LVL3 (GOWN DISPOSABLE)
KIT BASIN OR (CUSTOM PROCEDURE TRAY) IMPLANT
KIT TURNOVER KIT A (KITS) IMPLANT
NDL SAFETY ECLIPSE 18X1.5 (NEEDLE) IMPLANT
NEEDLE HYPO 18GX1.5 SHARP (NEEDLE)
NEEDLE HYPO 25X1 1.5 SAFETY (NEEDLE) IMPLANT
NS IRRIG 1000ML POUR BTL (IV SOLUTION) IMPLANT
PACK ORTHO EXTREMITY (CUSTOM PROCEDURE TRAY) ×2 IMPLANT
PADDING CAST ABS 4INX4YD NS (CAST SUPPLIES)
PADDING CAST ABS COTTON 4X4 ST (CAST SUPPLIES) IMPLANT
PENCIL SMOKE EVACUATOR (MISCELLANEOUS) IMPLANT
SPONGE T-LAP 18X18 ~~LOC~~+RFID (SPONGE) ×6 IMPLANT
STAPLER VISISTAT (STAPLE) IMPLANT
STOCKINETTE 6  STRL (DRAPES)
STOCKINETTE 6 STRL (DRAPES) IMPLANT
SUCTION FRAZIER HANDLE 12FR (TUBING) ×1
SUCTION TUBE FRAZIER 12FR DISP (TUBING) ×1 IMPLANT
SUT ETHILON 2 0 PS N (SUTURE) ×6 IMPLANT
SUT MNCRL AB 3-0 PS2 18 (SUTURE) IMPLANT
SUT PDS AB 2-0 CT2 27 (SUTURE) ×2 IMPLANT
SUT PDS AB 3-0 SH 27 (SUTURE) ×4 IMPLANT
SUT PROLENE 3 0 PS 2 (SUTURE) ×2 IMPLANT
SUT SILK 2 0 (SUTURE)
SUT SILK 2-0 18XBRD TIE 12 (SUTURE) IMPLANT
SWAB CULTURE ESWAB REG 1ML (MISCELLANEOUS) ×2 IMPLANT
SYR 10ML LL (SYRINGE) IMPLANT
SYR BULB EAR ULCER 3OZ GRN STR (SYRINGE) IMPLANT
TOWEL OR 17X26 10 PK STRL BLUE (TOWEL DISPOSABLE) ×4 IMPLANT
UNDERPAD 30X36 HEAVY ABSORB (UNDERPADS AND DIAPERS) ×2 IMPLANT

## 2020-12-18 NOTE — Transfer of Care (Signed)
Immediate Anesthesia Transfer of Care Note  Patient: [name]  Procedure(s) Performed: TRANSMETATARSAL AMPUTATION (Right: Toe)  Patient Location: PACU  Anesthesia Type:General  Level of Consciousness: awake, alert  and oriented  Airway & Oxygen Therapy: Patient Spontanous Breathing and Patient connected to face mask oxygen  Post-op Assessment: Report given to RN and Post -op Vital signs reviewed and stable  Post vital signs: Reviewed and stable  Last Vitals:  Vitals Value Taken Time  BP 123/75 12/18/20 1621  Temp    Pulse 80 12/18/20 1622  Resp 20 12/18/20 1622  SpO2 100 % 12/18/20 1622  Vitals shown include unvalidated device data.  Last Pain:  Vitals:   12/18/20 1216  TempSrc: Oral  PainSc:       Patients Stated Pain Goal: 2 (12/18/20 1000)  Complications: No notable events documented.

## 2020-12-18 NOTE — Op Note (Addendum)
12/18/2020  8:44 PM   PATIENT: [name]  40 y.o. female  MRN: [id number]   PRE-OPERATIVE DIAGNOSIS:   Right foot osteomyelitis Right foot abscesses Dystrophic calcification of right leg Uncontrolled diabetes mellitus (A1c 13.5) Morbid obesity (BMI 45.22) Prealbumin < 5 mg/dL Albumin 2.3 g/dL   POST-OPERATIVE DIAGNOSIS:   Right foot osteomyelitis Right foot abscesses Dystrophic calcification of right leg Uncontrolled diabetes mellitus (A1c 13.5) Morbid obesity (BMI 45.22) Prealbumin < 5 mg/dL Albumin 2.3 g/dL   PROCEDURE: Right foot transmetatarsal amputation Right foot incision and drainage of deep abscess   SURGEON:  Netta Cedars, MD   ASSISTANT: None   ANESTHESIA: General, regional   EBL: Minimal   TOURNIQUET:    Total Tourniquet Time Documented: Thigh (Right) - 61 minutes Total: Thigh (Right) - 61 minutes    COMPLICATIONS: None apparent   DISPOSITION: Extubated, awake and stable to recovery.   INDICATION FOR PROCEDURE: The patient is currently admitted to [facility]. She has right foot pain for the past year with acute worsening since late November 2022. She cannot recall specific injury. She is an uncontrolled diabetic with HgbA1c of 13.5. Her PMH also significant for BMI 45, HLD. She has noticed right 2nd toe blackish appearance for past 1 week.   It was determined the patient would benefit from right foot amputation, at least transmetatarsal amputation or potentially more proximal such as below knee amputation if this should fail. We discussed that unfortunately right second ray amputation would not be sufficient to achieve source control in her right foot. We also discussed that transmetatarsal amputation could potentially fail and we may need to do a more proximal amputation in the near or distant future. She understands and is ready to consent for right foot partial amputation with the possibility of delayed closure and serial  debridements if necessary.  We discussed the diagnosis, alternative treatment options, risks and benefits of the above surgical intervention, as well as alternative non-operative treatments. All questions/concerns were addressed and the patient/family demonstrated appropriate understanding of the diagnosis, the procedure, the postoperative course, and overall prognosis. The patient wished to proceed with surgical intervention and signed an informed surgical consent as such, in each others presence prior to surgery.   PROCEDURE IN DETAIL: After preoperative consent was obtained and the correct operative site was identified, the patient was brought to the operating room supine on stretcher and transferred onto operating table. General anesthesia was induced. Preoperative antibiotics were administered. Surgical timeout was taken. The patient was then positioned supine with an ipsilateral hip bump. The operative lower extremity was prepped and draped in standard sterile fashion with a tourniquet around the thigh. The extremity was gravity exsanguinated and the tourniquet was inflated to 300 mmHg.  We began by using fluoroscopy to localize the metatarsal shaft. Using information from the preoperative MRIs, proximal resection levels were utilized to optimize infection source control in hope of attaining clean margins.   A fishmouth incision was marked around the forefoot proximal from the area of gangrene.  Incision was made and dissection carried down through the subcutaneous tissues.  Subperiosteal dissection was carried proximally.  Soft tissues were protected.  The oscillating saw was used to cut through the metatarsals beveling the cuts appropriately.  The plantar soft tissues were divided creating a full-thickness plantar flap.  The cut surfaces of bone were smoothed with a rasp.   The plantar skin was noted to be extremely friable with superficial purulence noted in the skin that was thoroughly  debrided. The dead skin was excised leaving behind healthier skin layer for definitive closure. The more proximal abscess tracking along the flexor digitorum tendons as seen on preoperative MRI was accessed through blunt dissection and drained. Extensive soft tissue and muscle necrosis was noted and this was carefully debrided with curettes and rongeurs as well as sharp debridement until only grossly healthy appearing tissue remained. The purulence was swabbed and sent for cultures. The resected forefoot was sent as specimen. The surgical site was irrigated copiously.  There was no sign of residual infection or necrotic tissue.    Neurovascular bundles were cauterized. Hemostasis was obtained with the tourniquet deflated. Vancomycin powder was sprinkled in the deep layer of the wound.  Deep closure was obtained with 2-0 and 3-0 PDS suture. Skin incision was closed without tension using simple and mattress sutures of 2-0 and 3-0 nylon.   The leg was cleaned with saline and sterile adaptic dressing with bacitracin ointment was applied. A well padded loosely applied sterile wrap was placed. The patient was awakened from anesthesia and transported to the recovery room in stable condition.    FOLLOW UP PLAN: -transfer to PACU, then return to [facility] -IV abx per primary team/ID. Recommend a full course of antibiotic therapy given extent of infection and poor host factors placing her at high risk to limb/life -strict NWB operative extremity until sutures out, maximum elevation -maintain surgical dressing for 2 days postop, then dry dressing changes every other day until follow up -pain meds and VTE ppx per primary team -physical therapy -nutrition referral and treatment of low albumin/prealbumin along with dietary changes for diabetes control -optimize glycemic control & counseling -discharge home per primary team -sutures out in 4 weeks in outpatient office, return to clinic 2 weeks postop for wound  check   RADIOGRAPHS: AP, lateral, oblique radiographs of the right foot limb were obtained intraoperatively. These showed interval transmetatarsal amputation. No other acute findings are noted.   Netta Cedars [facility]

## 2020-12-18 NOTE — Progress Notes (Signed)
Initial Nutrition Assessment  DOCUMENTATION CODES:   Morbid obesity  INTERVENTION:   Once diet advanced: -Ensure MAX Protein po BID, each supplement provides 150 kcal and 30 grams of protein  -Juven BID, each serving provides 95kcal and 2.5g of protein (amino acids glutamine and arginine) -Multivitamin with minerals daily  NUTRITION DIAGNOSIS:   Increased nutrient needs related to wound healing as evidenced by estimated needs.  GOAL:   Patient will meet greater than or equal to 90% of their needs  MONITOR:   PO intake, Supplement acceptance, Labs, Weight trends, I & O's  REASON FOR ASSESSMENT:   Consult Wound healing  ASSESSMENT:   40 y.o. female with medical history significant of class III obesity, hyperlipidemia, type 2 diabetes currently not on medical therapy who is coming from [facility] emergency department where she presented with that week history of worsening chronic toe ulcer, which she has had for the past year plus, now coming with increased pain, development of purulent discharge and discoloration of second toe.Pt was admitted for concerns of osteomyelitis of R foot.  RD attempted to visit patient today, sign on door reading " Do not disturb". Asked nurse tech in hallway about this sign, tech reports pt has become increasingly irritated by visits from hospital staff. Someone in her room at this time, RN and tech had just left room. Pt is also expected to have surgery today at 1300. RD attempted to see pt prior to surgery but was already taken for prep. Will attempt to speak with pt at a later date.  Will recommend supplements to aid in wound healing and healing from surgery. Currently NPO, will order once diet advanced.  Per weight records, no recent weight changes noted.  Medications: Zofran  Labs reviewed:  CBGs: 196-224  NUTRITION - FOCUSED PHYSICAL EXAM:  In OR  Diet Order:   Diet Order             Diet NPO time specified  Diet  effective midnight                   EDUCATION NEEDS:   Not appropriate for education at this time  Skin:  Skin Assessment: Skin Integrity Issues: Skin Integrity Issues:: Diabetic Ulcer Diabetic Ulcer: right foot  Last BM:  12/2  Height:   Ht Readings from Last 1 Encounters:  12/16/20 5\' 9"  (1.753 m)    Weight:   Wt Readings from Last 1 Encounters:  12/18/20 (!) 138.9 kg    BMI:  Body mass index is 45.22 kg/m.  Estimated Nutritional Needs:   Kcal:  2100-2300  Protein:  100-115g  Fluid:  2.3L/day  14/05/22, MS, RD, LDN Inpatient Clinical Dietitian Contact information available via Amion

## 2020-12-18 NOTE — Anesthesia Procedure Notes (Signed)
Anesthesia Regional Block: Adductor canal block   Pre-Anesthetic Checklist: , timeout performed,  Correct Patient, Correct Site, Correct Laterality,  Correct Procedure, Correct Position, site marked,  Risks and benefits discussed,  Surgical consent,  Pre-op evaluation,  At surgeon's request and post-op pain management  Laterality: Right  Prep: Dura Prep       Needles:  Injection technique: Single-shot  Needle Type: Echogenic Stimulator Needle     Needle Length: 10cm  Needle Gauge: 20     Additional Needles:   Procedures:,,,, ultrasound used (permanent image in chart),,    Narrative:  Start time: 12/18/2020 1:14 PM End time: 12/18/2020 1:16 PM Injection made incrementally with aspirations every 5 mL.  Performed by: Personally  Anesthesiologist: Atilano Median, DO  Additional Notes: Patient identified. Risks/Benefits/Options discussed with patient including but not limited to bleeding, infection, nerve damage, failed block, incomplete pain control. Patient expressed understanding and wished to proceed. All questions were answered. Sterile technique was used throughout the entire procedure. Please see nursing notes for vital signs. Aspirated in 5cc intervals with injection for negative confirmation. Patient was given instructions on fall risk and not to get out of bed. All questions and concerns addressed with instructions to call with any issues or inadequate analgesia.

## 2020-12-18 NOTE — Progress Notes (Signed)
AssistedDr. Gavin Potters with right, ultrasound guided, popliteal/saphenous, adductor canal block. Side rails up, monitors on throughout procedure. See vital signs in flow sheet. Tolerated Procedure well.

## 2020-12-18 NOTE — Anesthesia Procedure Notes (Signed)
Procedure Name: LMA Insertion Date/Time: 12/18/2020 2:06 PM Performed by: Orest Dikes, CRNA Pre-anesthesia Checklist: Patient identified, Emergency Drugs available, Suction available and Patient being monitored Patient Re-evaluated:Patient Re-evaluated prior to induction Oxygen Delivery Method: Circle system utilized Preoxygenation: Pre-oxygenation with 100% oxygen Induction Type: IV induction LMA: LMA with gastric port inserted LMA Size: 5.0 Number of attempts: 1 Placement Confirmation: positive ETCO2 and breath sounds checked- equal and bilateral Tube secured with: Tape

## 2020-12-18 NOTE — Anesthesia Procedure Notes (Signed)
Procedure Name: MAC Date/Time: 12/18/2020 1:29 PM Performed by: Maxwell Caul, CRNA Pre-anesthesia Checklist: Patient identified, Emergency Drugs available, Suction available and Patient being monitored Oxygen Delivery Method: Simple face mask

## 2020-12-18 NOTE — Anesthesia Procedure Notes (Signed)
Anesthesia Regional Block: Popliteal block   Pre-Anesthetic Checklist: , timeout performed,  Correct Patient, Correct Site, Correct Laterality,  Correct Procedure, Correct Position, site marked,  Risks and benefits discussed,  Surgical consent,  Pre-op evaluation,  At surgeon's request and post-op pain management  Laterality: Right  Prep: Dura Prep       Needles:  Injection technique: Single-shot  Needle Type: Echogenic Stimulator Needle     Needle Length: 10cm  Needle Gauge: 20     Additional Needles:   Procedures:,,,, ultrasound used (permanent image in chart),,    Narrative:  Start time: 12/18/2020 1:17 PM End time: 12/18/2020 1:19 PM Injection made incrementally with aspirations every 5 mL.  Performed by: Personally  Anesthesiologist: Atilano Median, DO  Additional Notes: Patient identified. Risks/Benefits/Options discussed with patient including but not limited to bleeding, infection, nerve damage, failed block, incomplete pain control. Patient expressed understanding and wished to proceed. All questions were answered. Sterile technique was used throughout the entire procedure. Please see nursing notes for vital signs. Aspirated in 5cc intervals with injection for negative confirmation. Patient was given instructions on fall risk and not to get out of bed. All questions and concerns addressed with instructions to call with any issues or inadequate analgesia.

## 2020-12-18 NOTE — Progress Notes (Signed)
Subjective: Day of Surgery Procedure(s) (LRB): TRANSMETATARSAL AMPUTATION (Right)  Patient reports pain as moderate.    Objective:   VITALS:  Temp:  [98.3 F (36.8 C)-99.7 F (37.6 C)] 99.7 F (37.6 C) (12/05 2025) Pulse Rate:  [74-99] 89 (12/05 2025) Resp:  [12-23] 16 (12/05 2025) BP: (116-168)/(75-109) 151/93 (12/05 2025) SpO2:  [98 %-100 %] 100 % (12/05 2025) Weight:  [138.9 kg] 138.9 kg (12/05 1211)  Physical Exam  Gen: AAOx3, NAD   Right lower extremity: Diffuse swelling and erythema of forefoot Plantar bullae through midfoot, potentially purulent Right 2nd toe necrotic with loss of toe nail Frank purulence from dorsal forefoot Wiggles toes SILT over toes DP, PT 2+ to palpation CR<2s   LABS Recent Labs    12/16/20 0345 12/17/20 0425 12/18/20 0434  HGB 12.7 11.5* 12.3  WBC 19.9* 18.1* 16.1*  PLT 444* 351 337   Recent Labs    12/16/20 0345 12/17/20 0425  NA 128* 129*  K 4.3 3.8  CL 87* 97*  CO2 22 22  BUN 15 17  CREATININE 0.87 0.84  GLUCOSE 393* 231*   No results for input(s): LABPT, INR in the last 72 hours.   Assessment/Plan: Day of Surgery Procedure(s) (LRB): TRANSMETATARSAL AMPUTATION (Right)  40 yr old female with uncontrolled diabetes (HgbA1c >13), poor nutrition (Prealbumin <5) presenting with right foot cellulitis, necrotic right 2nd toe with acute worsening over past 10 days   -MRI of the right foot and ankle wwo contrast STAT completed for preop planning -optimize glycemic control and nutrition consult during admission -patient agrees to right foot amputation, likely transmetatarsal amputation or potentially more proximal (pending MRI). She understands and is ready to consent for amputation -NPO since MN 12/17/20 for OR today -Lovenox also held for OR today    Netta Cedars 12/18/2020, 8:40 PM

## 2020-12-18 NOTE — Brief Op Note (Signed)
12/18/2020  8:42 PM  PATIENT:  [name]  40 y.o. female  PRE-OPERATIVE DIAGNOSIS:  RIGHT FOOT INFECTION  POST-OPERATIVE DIAGNOSIS:  RIGHT FOOT INFECTION  PROCEDURE:  Procedure(s): TRANSMETATARSAL AMPUTATION (Right)  SURGEON:  Surgeon(s) and Role:    * Netta Cedars, MD - Primary  PHYSICIAN ASSISTANT: none  ASSISTANTS: none   ANESTHESIA:   regional and general  EBL:  10 mL   BLOOD ADMINISTERED:none  DRAINS: none   LOCAL MEDICATIONS USED:  NONE  SPECIMEN:  Source of Specimen:  right forefoot  DISPOSITION OF SPECIMEN:   Path and cultures  COUNTS:  YES  TOURNIQUET:   Total Tourniquet Time Documented: Thigh (Right) - 61 minutes Total: Thigh (Right) - 61 minutes   DICTATION: .Note written in EPIC  PLAN OF CARE: Admit to inpatient   PATIENT DISPOSITION:  PACU - hemodynamically stable.   Delay start of Pharmacological VTE agent (>24hrs) due to surgical blood loss or risk of bleeding: yes

## 2020-12-18 NOTE — Progress Notes (Signed)
PROGRESS NOTE    [name]  [id number] DOB: [birth year] DOA: 12/16/2020 PCP: Pcp, No    Brief Narrative:  40 y.o. female with medical history significant of class III obesity, hyperlipidemia, type 2 diabetes currently not on medical therapy who is coming from [facility] emergency department where she presented with that week history of worsening chronic toe ulcer, which she has had for the past year plus, now coming with increased pain, development of purulent discharge and discoloration of second toe.  She does not remember having any trauma to the area.  No fever, chills, but feels fatigue.  No rhinorrhea, sore throat, wheezing, dyspnea or hemoptysis.  No chest pain, palpitations, diaphoresis, PND, orthopnea or pitting edema of the lower extremities.  She has some nausea and vomiting earlier today.  No abdominal pain, diarrhea, constipation, melena or hematochezia.  No dysuria, frequency or hematuria.  She does not check her blood glucose regularly and has not been under treatment recently.  She denied polyuria, polydipsia, polyphagia or blurred vision.  She does not have a PCP currently. Pt was admitted for concerns of osteomyelitis of R foot  Assessment & Plan:   Principal Problem:   Osteomyelitis of second toe of right foot ([place]) Active Problems:   Type 2 diabetes mellitus with hyperglycemia (HCC)   Class 3 obesity (HCC)   Hyperlipidemia  Diabetes. EXAM: RIGHT FOOT COMPLETE - 3+ VIEW COMPARISON:  None. FINDINGS: Soft tissue swelling throughout the distal foot, maximal at the 2nd toe with gaping soft tissue ulceration or less likely soft tissue gas. Destruction of the 2nd distal phalanx, and the distal half of the middle phalanx. Destroyed 2nd D IP. Bone mineralization elsewhere in the right foot maintained. Extensive dystrophic calcifications in the soft tissues of the distal leg. IMPRESSION: Positive for Osteomyelitis of the 2nd toe distal AND middle phalanges. Distal  phalanx almost completely eroded. Electronically Signed   By: Genevie Ann M.D.   On: 12/16/2020 04:17       EKG: Independently reviewed.  Vent. rate 96 BPM PR interval 145 ms QRS duration 80 ms QT/QTcB 342/433 ms P-R-T axes 67 40 33 Sinus rhythm Consider anterior infarct   Assessment/Plan Principal Problem:   Osteomyelitis of second toe of right foot [facility]) Orthopedic Surgery consulted Analgesics as needed. Antiemetics as needed. Cefepime with vanc ordered at time of presentation MRI R foot reviewed, findings notable for osteomyelitis of the phalanges and metatarsal of the 2nd ray with soft tissue infection. Abscess in the dorsal flexor hallucis brevis with regional myositis and extensive cellulitis of forefoot and toes -Pt is planned for surgery by Orthopedic Surgery today   Active Problems:   Type 2 diabetes mellitus with hyperglycemia ([place]) Pt had been continued on IV fluids. Glycemic trends stable, albeit suboptimally controlled Cont SSI coverage A1c of 13.5. Pt admits to medical and dietary noncompliance Would add 15 units long acting insulin     Class 3 obesity (HCC) Recommend lifestyle modifications. Needs to establish with PCP.     Hyperlipidemia Currently not on medical therapy. Lifestyle modifications and establishment with PCP.   DVT prophylaxis: Lovenox subq Code Status: Full Family Communication: Pt in room, family not at bedside  Status is: Inpatient  Remains inpatient appropriate because: severity of illness    Consultants:  Orthopedic Surgery  Procedures:    Antimicrobials: Anti-infectives (From admission, onward)    Start     Dose/Rate Route Frequency Ordered Stop   12/18/20 1513  vancomycin (VANCOCIN) powder  As needed 12/18/20 1513     12/17/20 0800  [MAR Hold]  vancomycin (VANCOREADY) IVPB 1500 mg/300 mL        (MAR Hold since Mon 12/18/2020 at 1203.Hold Reason: Transfer to a Procedural area)   1,500 mg 150 mL/hr over 120 Minutes  Intravenous Every 12 hours 12/16/20 1747     12/16/20 2000  [MAR Hold]  ceFEPIme (MAXIPIME) 2 g in sodium chloride 0.9 % 100 mL IVPB        (MAR Hold since Mon 12/18/2020 at 1203.Hold Reason: Transfer to a Procedural area)   2 g 200 mL/hr over 30 Minutes Intravenous Every 8 hours 12/16/20 1735     12/16/20 1845  vancomycin (VANCOREADY) IVPB 2000 mg/400 mL        2,000 mg 200 mL/hr over 120 Minutes Intravenous  Once 12/16/20 1747 12/16/20 2115   12/16/20 0430  vancomycin (VANCOCIN) IVPB 1000 mg/200 mL premix        1,000 mg 200 mL/hr over 60 Minutes Intravenous  Once 12/16/20 0423 12/16/20 0636       Subjective: Without complaints this AM. Seen before surgery.  Objective: Vitals:   12/18/20 1317 12/18/20 1318 12/18/20 1319 12/18/20 1320  BP:    (!) 152/100  Pulse: 90 91 88 93  Resp: (!) 21 17 17 18   Temp:      TempSrc:      SpO2: 100% 100% 100% 99%  Weight:      Height:        Intake/Output Summary (Last 24 hours) at 12/18/2020 1540 Last data filed at 12/18/2020 1000 Gross per 24 hour  Intake 1480 ml  Output 1500 ml  Net -20 ml    Filed Weights   12/16/20 1358 12/18/20 1211  Weight: (!) 138.9 kg (!) 138.9 kg    Examination: General exam: Conversant, in no acute distress Respiratory system: normal chest rise, clear, no audible wheezing Cardiovascular system: regular rhythm, s1-s2 Gastrointestinal system: Nondistended, nontender, pos BS Central nervous system: No seizures, no tremors Extremities: No cyanosis, no joint deformities Skin: No rashes, no pallor Psychiatry: Affect normal // no auditory hallucinations   Data Reviewed: I have personally reviewed following labs and imaging studies  CBC: Recent Labs  Lab 12/16/20 0345 12/17/20 0425 12/18/20 0434  WBC 19.9* 18.1* 16.1*  NEUTROABS 16.3*  --   --   HGB 12.7 11.5* 12.3  HCT 39.0 35.0* 38.5  MCV 82.3 83.7 85.0  PLT 444* 351 [id number]    Basic Metabolic Panel: Recent Labs  Lab 12/16/20 0345 12/17/20 0425   NA 128* 129*  K 4.3 3.8  CL 87* 97*  CO2 22 22  GLUCOSE 393* 231*  BUN 15 17  CREATININE 0.87 0.84  CALCIUM 8.9 8.2*    GFR: Estimated Creatinine Clearance: 135.3 mL/min (by C-G formula based on SCr of 0.84 mg/dL). Liver Function Tests: Recent Labs  Lab 12/16/20 0345 12/17/20 0425  AST 13* 18  ALT 9 10  ALKPHOS 112 86  BILITOT 0.9 0.9  PROT 8.6* 7.2  ALBUMIN 2.6* 2.3*    No results for input(s): LIPASE, AMYLASE in the last 168 hours. No results for input(s): AMMONIA in the last 168 hours. Coagulation Profile: No results for input(s): INR, PROTIME in the last 168 hours. Cardiac Enzymes: No results for input(s): CKTOTAL, CKMB, CKMBINDEX, TROPONINI in the last 168 hours. BNP (last 3 results) No results for input(s): PROBNP in the last 8760 hours. HbA1C: Recent Labs    12/16/20 0752  HGBA1C  13.5*    CBG: Recent Labs  Lab 12/17/20 1614 12/17/20 2229 12/18/20 0737 12/18/20 1131 12/18/20 1209  GLUCAP 191* 196* 224* 216* 220*    Lipid Profile: No results for input(s): CHOL, HDL, LDLCALC, TRIG, CHOLHDL, LDLDIRECT in the last 72 hours. Thyroid Function Tests: No results for input(s): TSH, T4TOTAL, FREET4, T3FREE, THYROIDAB in the last 72 hours. Anemia Panel: No results for input(s): VITAMINB12, FOLATE, FERRITIN, TIBC, IRON, RETICCTPCT in the last 72 hours. Sepsis Labs: No results for input(s): PROCALCITON, LATICACIDVEN in the last 168 hours.  Recent Results (from the past 240 hour(s))  Blood culture (routine x 2)     Status: None (Preliminary result)   Collection Time: 12/16/20  4:15 AM   Specimen: BLOOD  Result Value Ref Range Status   Specimen Description   Final    BLOOD RIGHT ANTECUBITAL Performed at [facility], [address] [place], [place] [address]    Special Requests   Final    BOTTLES DRAWN AEROBIC AND ANAEROBIC Blood Culture results may not be optimal due to an inadequate volume of blood received in culture bottles Performed at  [facility], [address] [place], [place] [address]    Culture   Final    NO GROWTH 2 DAYS Performed at [facility], [address], [place], [place] [address]    Report Status PENDING  Incomplete  Blood culture (routine x 2)     Status: None (Preliminary result)   Collection Time: 12/16/20  4:20 AM   Specimen: BLOOD  Result Value Ref Range Status   Specimen Description   Final    BLOOD RIGHT FOREARM Performed at [facility], [address] [place], [place] [address]    Special Requests   Final    BOTTLES DRAWN AEROBIC AND ANAEROBIC Blood Culture results may not be optimal due to an inadequate volume of blood received in culture bottles Performed at [facility], [address] [place], [place] [address]    Culture   Final    NO GROWTH 2 DAYS Performed at [facility], [address] [place], [place] [address]    Report Status PENDING  Incomplete  Resp Panel by RT-PCR (Flu A&B, Covid) Nasopharyngeal Swab     Status: None   Collection Time: 12/16/20  5:20 AM   Specimen: Nasopharyngeal Swab; Nasopharyngeal(NP) swabs in vial transport medium  Result Value Ref Range Status   SARS Coronavirus 2 by RT PCR NEGATIVE NEGATIVE Final    Comment: (NOTE) SARS-CoV-2 target nucleic acids are NOT DETECTED.  The SARS-CoV-2 RNA is generally detectable in upper respiratory specimens during the acute phase of infection. The lowest concentration of SARS-CoV-2 viral copies this assay can detect is 138 copies/mL. A negative result does not preclude SARS-Cov-2 infection and should not be used as the sole basis for treatment or other patient management decisions. A negative result may occur with  improper specimen collection/handling, submission of specimen other than nasopharyngeal swab, presence of viral mutation(s) within the areas targeted by this assay, and inadequate number of viral copies(<138 copies/mL). A negative result must be combined  with clinical observations, patient history, and epidemiological information. The expected result is Negative.  Fact Sheet for Patients:  BloggerCourse.com  Fact Sheet for Healthcare Providers:  SeriousBroker.it  This test is no t yet approved or cleared by the [place] FDA and  has been authorized for detection and/or diagnosis of  SARS-CoV-2 by FDA under an Emergency Use Authorization (EUA). This EUA will remain  in effect (meaning this test can be used) for the duration of the COVID-19 declaration under Section 564(b)(1) of the Act, 21 U.S.C.section 360bbb-3(b)(1), unless the authorization is terminated  or revoked sooner.       Influenza A by PCR NEGATIVE NEGATIVE Final   Influenza B by PCR NEGATIVE NEGATIVE Final    Comment: (NOTE) The Xpert Xpress SARS-CoV-2/FLU/RSV plus assay is intended as an aid in the diagnosis of influenza from Nasopharyngeal swab specimens and should not be used as a sole basis for treatment. Nasal washings and aspirates are unacceptable for Xpert Xpress SARS-CoV-2/FLU/RSV testing.  Fact Sheet for Patients: EntrepreneurPulse.com.au  Fact Sheet for Healthcare Providers: IncredibleEmployment.be  This test is not yet approved or cleared by the [place] FDA and has been authorized for detection and/or diagnosis of SARS-CoV-2 by FDA under an Emergency Use Authorization (EUA). This EUA will remain in effect (meaning this test can be used) for the duration of the COVID-19 declaration under Section 564(b)(1) of the Act, 21 U.S.C. section 360bbb-3(b)(1), unless the authorization is terminated or revoked.  Performed at [facility], [address] [place], [place] [address]   Surgical pcr screen     Status: None   Collection Time: 12/17/20  1:16 PM   Specimen: Nasal Mucosa; Nasal Swab  Result Value Ref Range Status   MRSA, PCR NEGATIVE NEGATIVE  Final   Staphylococcus aureus NEGATIVE NEGATIVE Final    Comment: (NOTE) The Xpert SA Assay (FDA approved for NASAL specimens in patients 79 years of age and older), is one component of a comprehensive surveillance program. It is not intended to diagnose infection nor to guide or monitor treatment. Performed at [facility], [place] [address], [place], [place] [address]       Radiology Studies: MR FOOT RIGHT W WO CONTRAST  Result Date: 12/17/2020 CLINICAL DATA:  Osteomyelitis of the foot EXAM: MRI OF THE RIGHT FOREFOOT WITHOUT AND WITH CONTRAST TECHNIQUE: Multiplanar, multisequence MR imaging of the right forefoot was performed before and after the administration of intravenous contrast. CONTRAST:  10 cc Gadavist COMPARISON:  Radiographs 12/16/2020 FINDINGS: Bones/Joint/Cartilage Abnormal marrow edema compatible with osteomyelitis involving the phalanges of the second toe and the second metatarsal, extending in the metatarsal back to the proximal metaphysis. There is probably a small erosion along the lateral margin of the proximal metaphysis of the second metatarsal. Degenerative findings along the Lisfranc joint. No other compelling regions for osteomyelitis in the forefoot. Ligaments Lisfranc ligament unremarkable. Muscles and Tendons Abscess and myositis tracking in the flexor digitorum brevis muscle as shown previously. Low-grade edema and enhancement tracking along the interosseous muscles and the abductor hallucis, potentially reflecting mild myositis. Soft tissues There is gas in the soft tissues of the second toe with extensive edema and hypoenhancement compatible with necrotic tissue and soft tissue infection which tracks back along the base of the second toe and to a lesser extent in the dorsum of the foot adjacent to the second toe. There is also abnormal hypoenhancement of tissues along the plantar subcutaneous foot back from the second toe and extending along and  proximally 11 cm excursion, necrotic tissue and soft tissue infection in this vicinity is not excluded and there is an associated subcutaneous fluid collection as shown on image 21 of series 7, with ill-defined margins, but measuring about 8 cc. Extensive subcutaneous edema circumferentially in the forefoot compatible with cellulitis.  This extends into all of the toes. IMPRESSION: 1. Osteomyelitis of the phalanges and metatarsal of the second ray, with soft tissue infection in the second toe with locules of gas and hypoenhancement favoring necrotic tissue. Some of this extends back to the ball of the foot in the second toe, along with abnormal subcutaneous collection potentially reflecting abscess measuring about 8 cc in volume along the plantar foot. 2. Abscess in the dorsal flexor hallucis brevis muscle as noted previously, with questionable connectivity to the plantar subcutaneous fluid and debris collection. Regional myositis noted along with extensive cellulitis of the forefoot and toes. Electronically Signed   By: Van Clines M.D.   On: 12/17/2020 13:07   MR ANKLE RIGHT W WO CONTRAST  Result Date: 12/17/2020 CLINICAL DATA:  Diabetes, foot infection, preoperative planning. EXAM: MRI OF THE RIGHT ANKLE WITHOUT AND WITH CONTRAST TECHNIQUE: Multiplanar, multisequence MR imaging of the ankle was performed before and after the administration of intravenous contrast. CONTRAST:  15mL GADAVIST GADOBUTROL 1 MMOL/ML IV SOLN COMPARISON:  Radiographs 12/16/2020 FINDINGS: TENDONS Peroneal: Unremarkable Posteromedial: Moderate distal tibialis posterior tendinopathy. Anterior: Unremarkable Achilles: Unremarkable Plantar Fascia: No substantial thickening of the plantar fascia. LIGAMENTS Lateral: Unremarkable Medial: Unremarkable CARTILAGE Ankle Joint: 3 mm non-fragmented osteochondral lesion of the medial talar dome, image 21 series 4. Subtalar Joints/Sinus Tarsi: Unremarkable Bones: Marrow edema in the shaft of the  second metatarsal compatible osteomyelitis based on the foot MRI appearance. Other: 1.1 by 0.7 by 6.4 cm (volume = 3 cm^3) fluid signal intensity collection with enhancing margins tracking within along the dorsal portion of the flexor digitorum brevis muscle as shown on image 29 series 10. Appearance compatible with abscess. There is abnormal edema and enhancement in the flexor digitorum brevis muscle favoring myositis, and low-level edema tracking in the abductor hallucis muscle potentially also from myositis. Dorsal subcutaneous edema and enhancement along the ankle favoring cellulitis. IMPRESSION: IMPRESSION 1. 3 cc abscess tracking within the dorsal portion of the flexor digitorum brevis muscle. Myositis involving the flexor digitorum brevis and abductor hallucis muscles. 2. Marrow edema in the shaft of the second metatarsal compatible with osteomyelitis based on the foot MRI appearance. 3. 3 mm non-fragmented osteochondral lesion of the medial talar dome. 4. Moderate distal tibialis posterior tendinopathy. 5. Dorsal cellulitis along the ankle. Electronically Signed   By: Van Clines M.D.   On: 12/17/2020 13:00   VAS [place] ABI WITH/WO TBI  Result Date: 12/17/2020  LOWER EXTREMITY DOPPLER STUDY Patient Name:  ALIEA BEHRNS  Date of Exam:   12/17/2020 Medical Rec #: [id number]       Accession #:    [id number] Date of Birth: [birth year]       Patient Gender: F Patient Age:   40 years Exam Location:  [facility] Procedure:      VAS [place] ABI WITH/WO TBI Referring Phys: Armond Hang --------------------------------------------------------------------------------  Indications: Ulceration, and osteomyelitis of the second toe distal and middle              phalanges. Distal phalanx is almost completely eroded. High Risk Factors: Diabetes. Other Factors: Not on medical therapy.  Limitations: Today's exam was limited due to bandages and tachycardia. Comparison Study: No prior study Performing Technologist:  Darlin Coco  Examination Guidelines: A complete evaluation includes at minimum, Doppler waveform signals and systolic blood pressure reading at the level of bilateral brachial, anterior tibial, and posterior tibial arteries, when vessel segments are accessible. Bilateral testing is considered an integral part of a  complete examination. Photoelectric Plethysmograph (PPG) waveforms and toe systolic pressure readings are included as required and additional duplex testing as needed. Limited examinations for reoccurring indications may be performed as noted.  ABI Findings: +---------+------------------+-----+-----------+-------------------+ Right    Rt Pressure (mmHg)IndexWaveform   Comment             +---------+------------------+-----+-----------+-------------------+ Brachial 137                    multiphasic                    +---------+------------------+-----+-----------+-------------------+ PTA      156               1.11 multiphasic                    +---------+------------------+-----+-----------+-------------------+ DP       152               1.08 multiphasic                    +---------+------------------+-----+-----------+-------------------+ Great Toe                                  ulceration/bandages +---------+------------------+-----+-----------+-------------------+ +---------+------------------+-----+-----------+-------+ Left     Lt Pressure (mmHg)IndexWaveform   Comment +---------+------------------+-----+-----------+-------+ Brachial 141                    multiphasic        +---------+------------------+-----+-----------+-------+ PTA      155               1.10 multiphasic        +---------+------------------+-----+-----------+-------+ DP       159               1.13 multiphasic        +---------+------------------+-----+-----------+-------+ Great Toe126               0.89                     +---------+------------------+-----+-----------+-------+  Summary: Right: Resting right ankle-brachial index is within normal range. No evidence of significant right lower extremity arterial disease. Unable to attain ABI secondary to ulceration and bandages. Left: Resting left ankle-brachial index is within normal range. No evidence of significant left lower extremity arterial disease. The left toe-brachial index is normal.  *See table(s) above for measurements and observations.  Electronically signed by Deitra Mayo MD on 12/17/2020 at 4:25:18 PM.    Final     Scheduled Meds:  chlorhexidine  60 mL Topical Once   fentaNYL (SUBLIMAZE) injection  50-100 mcg Intravenous UD   [MAR Hold] insulin aspart  0-20 Units Subcutaneous TID WC   [MAR Hold] insulin aspart  0-5 Units Subcutaneous QHS   midazolam  1-2 mg Intravenous UD   Continuous Infusions:  [MAR Hold] ceFEPime (MAXIPIME) IV 2 g (12/18/20 1133)   [MAR Hold] vancomycin 1,500 mg (12/18/20 0739)     LOS: 2 days   Marylu Lund, MD [facility] Pager On Amion  If 7PM-7AM, please contact night-coverage 12/18/2020, 3:40 PM

## 2020-12-18 NOTE — Anesthesia Preprocedure Evaluation (Addendum)
Anesthesia Evaluation  Patient identified by MRN, date of birth, ID band Patient awake    Reviewed: Allergy & Precautions, NPO status , Patient's Chart, lab work & pertinent test results  Airway Mallampati: III  TM Distance: >3 FB Neck ROM: Full    Dental no notable dental hx.    Pulmonary neg pulmonary ROS,    Pulmonary exam normal        Cardiovascular negative cardio ROS   Rhythm:Regular Rate:Normal     Neuro/Psych negative neurological ROS  negative psych ROS   GI/Hepatic negative GI ROS, Neg liver ROS,   Endo/Other  diabetes, Poorly Controlled, Type 2Morbid obesity  Renal/GU negative Renal ROS  negative genitourinary   Musculoskeletal Right foot infection   Abdominal Normal abdominal exam  (+)   Peds  Hematology negative hematology ROS (+)   Anesthesia Other Findings   Reproductive/Obstetrics                            Anesthesia Physical Anesthesia Plan  ASA: 3  Anesthesia Plan: MAC and Regional   Post-op Pain Management: Regional block   Induction: Intravenous  PONV Risk Score and Plan: 2 and Ondansetron, Dexamethasone, Midazolam, Treatment may vary due to age or medical condition and Propofol infusion  Airway Management Planned: Simple Face Mask, Natural Airway and Nasal Cannula  Additional Equipment: None  Intra-op Plan:   Post-operative Plan:   Informed Consent: I have reviewed the patients History and Physical, chart, labs and discussed the procedure including the risks, benefits and alternatives for the proposed anesthesia with the patient or authorized representative who has indicated his/her understanding and acceptance.     Dental advisory given  Plan Discussed with: CRNA  Anesthesia Plan Comments: (Lab Results      Component                Value               Date                      WBC                      16.1 (H)            12/18/2020                 HGB                      12.3                12/18/2020                HCT                      38.5                12/18/2020                MCV                      85.0                12/18/2020                PLT  337                 12/18/2020          )        Anesthesia Quick Evaluation

## 2020-12-18 NOTE — Anesthesia Postprocedure Evaluation (Signed)
Anesthesia Post Note  Patient: [name]  Procedure(s) Performed: TRANSMETATARSAL AMPUTATION (Right: Toe)     Patient location during evaluation: PACU Anesthesia Type: Regional and General Level of consciousness: awake and alert Pain management: pain level controlled Vital Signs Assessment: post-procedure vital signs reviewed and stable Respiratory status: spontaneous breathing, nonlabored ventilation, respiratory function stable and patient connected to nasal cannula oxygen Cardiovascular status: blood pressure returned to baseline and stable Postop Assessment: no apparent nausea or vomiting Anesthetic complications: no   No notable events documented.  Last Vitals:  Vitals:   12/18/20 1700 12/18/20 1715  BP: (!) 137/91 (!) 150/93  Pulse: 74 79  Resp: 19 18  Temp:  37.1 C  SpO2: 98% 99%    Last Pain:  Vitals:   12/18/20 1715  TempSrc:   PainSc: Asleep                 [name]

## 2020-12-19 ENCOUNTER — Encounter (HOSPITAL_COMMUNITY): Payer: Self-pay | Admitting: Orthopaedic Surgery

## 2020-12-19 DIAGNOSIS — M869 Osteomyelitis, unspecified: Secondary | ICD-10-CM | POA: Diagnosis not present

## 2020-12-19 LAB — COMPREHENSIVE METABOLIC PANEL
ALT: 12 U/L (ref 0–44)
AST: 22 U/L (ref 15–41)
Albumin: 2.1 g/dL — ABNORMAL LOW (ref 3.5–5.0)
Alkaline Phosphatase: 84 U/L (ref 38–126)
Anion gap: 11 (ref 5–15)
BUN: 14 mg/dL (ref 6–20)
CO2: 22 mmol/L (ref 22–32)
Calcium: 8.2 mg/dL — ABNORMAL LOW (ref 8.9–10.3)
Chloride: 96 mmol/L — ABNORMAL LOW (ref 98–111)
Creatinine, Ser: 0.81 mg/dL (ref 0.44–1.00)
GFR, Estimated: >60 mL/min (ref >60)
Glucose, Bld: 362 mg/dL — ABNORMAL HIGH (ref 70–99)
Potassium: 4.2 mmol/L (ref 3.5–5.1)
Sodium: 129 mmol/L — ABNORMAL LOW (ref 135–145)
Total Bilirubin: 0.9 mg/dL (ref 0.3–1.2)
Total Protein: 7.3 g/dL (ref 6.5–8.1)

## 2020-12-19 LAB — CBC
HCT: 34.3 % — ABNORMAL LOW (ref 36.0–46.0)
Hemoglobin: 10.8 g/dL — ABNORMAL LOW (ref 12.0–15.0)
MCH: 26.6 pg (ref 26.0–34.0)
MCHC: 31.5 g/dL (ref 30.0–36.0)
MCV: 84.5 fL (ref 80.0–100.0)
Platelets: 324 10*3/uL (ref 150–400)
RBC: 4.06 MIL/uL (ref 3.87–5.11)
RDW: 14.7 % (ref 11.5–15.5)
WBC: 19.8 10*3/uL — ABNORMAL HIGH (ref 4.0–10.5)
nRBC: 0 % (ref 0.0–0.2)

## 2020-12-19 LAB — GLUCOSE, CAPILLARY
Glucose-Capillary: 308 mg/dL — ABNORMAL HIGH (ref 70–99)
Glucose-Capillary: 181 mg/dL — ABNORMAL HIGH (ref 70–99)
Glucose-Capillary: 291 mg/dL — ABNORMAL HIGH (ref 70–99)
Glucose-Capillary: 296 mg/dL — ABNORMAL HIGH (ref 70–99)

## 2020-12-19 MED ORDER — INSULIN ASPART 100 UNIT/ML IJ SOLN
5.0000 [IU] | Freq: Three times a day (TID) | INTRAMUSCULAR | Status: DC
  Administered 2020-12-19 – 2020-12-20 (×2): 5 [IU] via SUBCUTANEOUS

## 2020-12-19 MED ORDER — GABAPENTIN 300 MG PO CAPS
300.0000 mg | ORAL_CAPSULE | Freq: Three times a day (TID) | ORAL | Status: DC
  Administered 2020-12-19 – 2020-12-22 (×9): 300 mg via ORAL
  Filled 2020-12-19 (×9): qty 1

## 2020-12-19 MED ORDER — GABAPENTIN 100 MG PO CAPS
200.0000 mg | ORAL_CAPSULE | Freq: Three times a day (TID) | ORAL | Status: DC
  Administered 2020-12-19: 200 mg via ORAL
  Filled 2020-12-19: qty 2

## 2020-12-19 MED ORDER — ADULT MULTIVITAMIN W/MINERALS CH
1.0000 | ORAL_TABLET | Freq: Every day | ORAL | Status: DC
  Administered 2020-12-19 – 2020-12-23 (×5): 1 via ORAL
  Filled 2020-12-19 (×5): qty 1

## 2020-12-19 MED ORDER — INSULIN GLARGINE-YFGN 100 UNIT/ML ~~LOC~~ SOLN: 18.0000 [IU] | Freq: Every day | SUBCUTANEOUS | Status: DC

## 2020-12-19 MED ORDER — GLUCERNA SHAKE PO LIQD
237.0000 mL | Freq: Three times a day (TID) | ORAL | Status: DC
  Administered 2020-12-19 – 2020-12-23 (×13): 237 mL via ORAL
  Filled 2020-12-19 (×17): qty 237

## 2020-12-19 MED ORDER — HYDROMORPHONE HCL 1 MG/ML IJ SOLN
1.0000 mg | INTRAMUSCULAR | Status: DC | PRN
  Administered 2020-12-19 – 2020-12-22 (×10): 1 mg via INTRAVENOUS
  Filled 2020-12-19 (×10): qty 1

## 2020-12-19 MED ORDER — INSULIN GLARGINE-YFGN 100 UNIT/ML ~~LOC~~ SOLN
20.0000 [IU] | Freq: Every day | SUBCUTANEOUS | Status: DC
  Administered 2020-12-19: 20 [IU] via SUBCUTANEOUS
  Filled 2020-12-19: qty 0.2

## 2020-12-19 MED ORDER — METHOCARBAMOL 1000 MG/10ML IJ SOLN
500.0000 mg | Freq: Three times a day (TID) | INTRAVENOUS | Status: DC | PRN
  Administered 2020-12-19 – 2020-12-22 (×4): 500 mg via INTRAVENOUS
  Filled 2020-12-19: qty 500
  Filled 2020-12-19: qty 5
  Filled 2020-12-19 (×3): qty 500

## 2020-12-19 MED ORDER — ENSURE MAX PROTEIN PO LIQD
11.0000 [oz_av] | Freq: Two times a day (BID) | ORAL | Status: DC
  Administered 2020-12-19 – 2020-12-23 (×8): 11 [oz_av] via ORAL

## 2020-12-19 MED ORDER — MORPHINE SULFATE (PF) 2 MG/ML IV SOLN
1.0000 mg | INTRAVENOUS | Status: DC | PRN
  Administered 2020-12-19: 1 mg via INTRAVENOUS
  Filled 2020-12-19: qty 1

## 2020-12-19 MED ORDER — FENTANYL CITRATE PF 50 MCG/ML IJ SOSY
12.5000 ug | PREFILLED_SYRINGE | Freq: Once | INTRAMUSCULAR | Status: AC
  Administered 2020-12-19: 12.5 ug via INTRAVENOUS

## 2020-12-19 MED ORDER — FENTANYL CITRATE PF 50 MCG/ML IJ SOSY
PREFILLED_SYRINGE | INTRAMUSCULAR | Status: AC
  Filled 2020-12-19: qty 1

## 2020-12-19 MED ORDER — JUVEN PO PACK
1.0000 | PACK | Freq: Two times a day (BID) | ORAL | Status: DC
  Administered 2020-12-19 – 2020-12-23 (×10): 1 via ORAL
  Filled 2020-12-19 (×12): qty 1

## 2020-12-19 NOTE — Progress Notes (Signed)
PROGRESS NOTE    [name]  [id number] DOB: Oct 15, 1980 DOA: 12/16/2020 PCP: Pcp, No    Brief Narrative:  40 y.o. female with medical history significant of class III obesity, hyperlipidemia, type 2 diabetes currently not on medical therapy who is coming from [place] emergency department where she presented with that week history of worsening chronic toe ulcer, which she has had for the past year plus, now coming with increased pain, development of purulent discharge and discoloration of second toe.  She does not remember having any trauma to the area.  No fever, chills, but feels fatigue.  No rhinorrhea, sore throat, wheezing, dyspnea or hemoptysis.  No chest pain, palpitations, diaphoresis, PND, orthopnea or pitting edema of the lower extremities.  She has some nausea and vomiting earlier today.  No abdominal pain, diarrhea, constipation, melena or hematochezia.  No dysuria, frequency or hematuria.  She does not check her blood glucose regularly and has not been under treatment recently.  She denied polyuria, polydipsia, polyphagia or blurred vision.  She does not have a PCP currently. Pt was admitted for concerns of osteomyelitis of R foot  Assessment & Plan:   Principal Problem:   Osteomyelitis of second toe of right foot ([place]) Active Problems:   Type 2 diabetes mellitus with hyperglycemia (HCC)   Class 3 obesity (HCC)   Hyperlipidemia  Diabetes. EXAM: RIGHT FOOT COMPLETE - 3+ VIEW COMPARISON:  None. FINDINGS: Soft tissue swelling throughout the distal foot, maximal at the 2nd toe with gaping soft tissue ulceration or less likely soft tissue gas. Destruction of the 2nd distal phalanx, and the distal half of the middle phalanx. Destroyed 2nd D IP. Bone mineralization elsewhere in the right foot maintained. Extensive dystrophic calcifications in the soft tissues of the distal leg. IMPRESSION: Positive for Osteomyelitis of the 2nd toe distal AND middle phalanges. Distal  phalanx almost completely eroded. Electronically Signed   By: Genevie Ann M.D.   On: 12/16/2020 04:17       EKG: Independently reviewed.  Vent. rate 96 BPM PR interval 145 ms QRS duration 80 ms QT/QTcB 342/433 ms P-R-T axes 67 40 33 Sinus rhythm Consider anterior infarct   Assessment/Plan Principal Problem:   Osteomyelitis of second toe of right foot [facility]) Orthopedic Surgery consulted Cefepime with vanc ordered at time of presentation MRI R foot with findings concerning for osteomyelitis of the phalanges and metatarsal of the 2nd ray with soft tissue infection. Abscess in the dorsal flexor hallucis brevis with regional myositis and extensive cellulitis of forefoot and toes -Pt is s/p R foot transmetatarsal amputation and I/d of deep abscess -discussed with Orthopedic Surgery who recommends prolonged (>6wks) of IV abx -Had discussed with ID regarding abx choice on d/c. ID to see in consult -PT/OT to see   Active Problems:   Type 2 diabetes mellitus with hyperglycemia ([place]) Pt had been continued on IV fluids. Glycemic trends stable, albeit suboptimally controlled Cont SSI coverage A1c of 13.5. Pt admits to medical and dietary noncompliance Glycemic trends remain poorly controlled. Increased lantus to 20 units with 5 units meal coverage added     Class 3 obesity (HCC) Recommend lifestyle modifications. Needs to establish with PCP.     Hyperlipidemia Currently not on medical therapy. Will check fasting lipids Lifestyle modifications and establishment with PCP.   DVT prophylaxis: Lovenox subq Code Status: Full Family Communication: Pt in room, family not at bedside  Status is: Inpatient  Remains inpatient appropriate because: severity of illness  Consultants:  Orthopedic Surgery  Procedures:    Antimicrobials: Anti-infectives (From admission, onward)    Start     Dose/Rate Route Frequency Ordered Stop   12/18/20 1513  vancomycin (VANCOCIN) powder  Status:   Discontinued          As needed 12/18/20 1513 12/18/20 1734   12/17/20 0800  vancomycin (VANCOREADY) IVPB 1500 mg/300 mL        1,500 mg 150 mL/hr over 120 Minutes Intravenous Every 12 hours 12/16/20 1747     12/16/20 2000  ceFEPIme (MAXIPIME) 2 g in sodium chloride 0.9 % 100 mL IVPB        2 g 200 mL/hr over 30 Minutes Intravenous Every 8 hours 12/16/20 1735     12/16/20 1845  vancomycin (VANCOREADY) IVPB 2000 mg/400 mL        2,000 mg 200 mL/hr over 120 Minutes Intravenous  Once 12/16/20 1747 12/16/20 2115   12/16/20 0430  vancomycin (VANCOCIN) IVPB 1000 mg/200 mL premix        1,000 mg 200 mL/hr over 60 Minutes Intravenous  Once 12/16/20 0423 12/16/20 0636       Subjective: Reports marked pain thsi AM and overnight, requesting increased analgesia  Objective: Vitals:   12/19/20 0130 12/19/20 0515 12/19/20 0929 12/19/20 1301  BP: (!) 117/56 (!) 144/86 (!) 147/90 (!) 160/100  Pulse: (!) 109 (!) 101 97 100  Resp: 16 16 18 18   Temp: 99.5 F (37.5 C) 98.9 F (37.2 C) 98.6 F (37 C) 99 F (37.2 C)  TempSrc: Oral Oral Oral Oral  SpO2: 99% 99% 97% 95%  Weight:      Height:        Intake/Output Summary (Last 24 hours) at 12/19/2020 1450 Last data filed at 12/19/2020 1434 Gross per 24 hour  Intake 2619.27 ml  Output 3660 ml  Net -1040.73 ml    Filed Weights   12/16/20 1358 12/18/20 1211  Weight: (!) 138.9 kg (!) 138.9 kg    Examination: General exam: Awake, laying in bed, crying in pain Respiratory system: Normal respiratory effort, no wheezing Cardiovascular system: regular rate, s1, s2 Gastrointestinal system: Soft, nondistended, positive BS Central nervous system: CN2-12 grossly intact, strength intact Extremities: normal skin turgor, post-op dressings in place Skin: Normal skin turgor, no notable skin lesions seen Psychiatry: Mood normal // no visual hallucinations   Data Reviewed: I have personally reviewed following labs and imaging studies  CBC: Recent  Labs  Lab 12/16/20 0345 12/17/20 0425 12/18/20 0434 12/19/20 0445  WBC 19.9* 18.1* 16.1* 19.8*  NEUTROABS 16.3*  --   --   --   HGB 12.7 11.5* 12.3 10.8*  HCT 39.0 35.0* 38.5 34.3*  MCV 82.3 83.7 85.0 84.5  PLT 444* 351 337 0000000    Basic Metabolic Panel: Recent Labs  Lab 12/16/20 0345 12/17/20 0425 12/19/20 0445  NA 128* 129* 129*  K 4.3 3.8 4.2  CL 87* 97* 96*  CO2 22 22 22   GLUCOSE 393* 231* 362*  BUN 15 17 14   CREATININE 0.87 0.84 0.81  CALCIUM 8.9 8.2* 8.2*    GFR: Estimated Creatinine Clearance: 140.3 mL/min (by C-G formula based on SCr of 0.81 mg/dL). Liver Function Tests: Recent Labs  Lab 12/16/20 0345 12/17/20 0425 12/19/20 0445  AST 13* 18 22  ALT 9 10 12   ALKPHOS 112 86 84  BILITOT 0.9 0.9 0.9  PROT 8.6* 7.2 7.3  ALBUMIN 2.6* 2.3* 2.1*    No results for input(s): LIPASE, AMYLASE  in the last 168 hours. No results for input(s): AMMONIA in the last 168 hours. Coagulation Profile: No results for input(s): INR, PROTIME in the last 168 hours. Cardiac Enzymes: No results for input(s): CKTOTAL, CKMB, CKMBINDEX, TROPONINI in the last 168 hours. BNP (last 3 results) No results for input(s): PROBNP in the last 8760 hours. HbA1C: No results for input(s): HGBA1C in the last 72 hours.  CBG: Recent Labs  Lab 12/18/20 1209 12/18/20 1623 12/18/20 2058 12/19/20 0724 12/19/20 1122  GLUCAP 220* 228* 193* 308* 181*    Lipid Profile: No results for input(s): CHOL, HDL, LDLCALC, TRIG, CHOLHDL, LDLDIRECT in the last 72 hours. Thyroid Function Tests: No results for input(s): TSH, T4TOTAL, FREET4, T3FREE, THYROIDAB in the last 72 hours. Anemia Panel: No results for input(s): VITAMINB12, FOLATE, FERRITIN, TIBC, IRON, RETICCTPCT in the last 72 hours. Sepsis Labs: No results for input(s): PROCALCITON, LATICACIDVEN in the last 168 hours.  Recent Results (from the past 240 hour(s))  Blood culture (routine x 2)     Status: None (Preliminary result)   Collection  Time: 12/16/20  4:15 AM   Specimen: BLOOD  Result Value Ref Range Status   Specimen Description   Final    BLOOD RIGHT ANTECUBITAL Performed at [facility], [address] [place], [place] [address]    Special Requests   Final    BOTTLES DRAWN AEROBIC AND ANAEROBIC Blood Culture results may not be optimal due to an inadequate volume of blood received in culture bottles Performed at [facility], [address] [place], [place] [address]    Culture   Final    NO GROWTH 3 DAYS Performed at [facility], [address], [place], [place] [address]    Report Status PENDING  Incomplete  Blood culture (routine x 2)     Status: None (Preliminary result)   Collection Time: 12/16/20  4:20 AM   Specimen: BLOOD  Result Value Ref Range Status   Specimen Description   Final    BLOOD RIGHT FOREARM Performed at [facility], [address] [place], [place] [address]    Special Requests   Final    BOTTLES DRAWN AEROBIC AND ANAEROBIC Blood Culture results may not be optimal due to an inadequate volume of blood received in culture bottles Performed at [facility], [address] [place], [place] [address]    Culture   Final    NO GROWTH 3 DAYS Performed at [facility], [address] [place], [place] [address]    Report Status PENDING  Incomplete  Resp Panel by RT-PCR (Flu A&B, Covid) Nasopharyngeal Swab     Status: None   Collection Time: 12/16/20  5:20 AM   Specimen: Nasopharyngeal Swab; Nasopharyngeal(NP) swabs in vial transport medium  Result Value Ref Range Status   SARS Coronavirus 2 by RT PCR NEGATIVE NEGATIVE Final    Comment: (NOTE) SARS-CoV-2 target nucleic acids are NOT DETECTED.  The SARS-CoV-2 RNA is generally detectable in upper respiratory specimens during the acute phase of infection. The lowest concentration of SARS-CoV-2 viral copies this assay can detect is 138 copies/mL. A negative result does not preclude  SARS-Cov-2 infection and should not be used as the sole basis for treatment or other patient management decisions. A negative result may occur with  improper specimen collection/handling, submission of specimen other than nasopharyngeal swab, presence of viral mutation(s) within the areas targeted by this assay, and  inadequate number of viral copies(<138 copies/mL). A negative result must be combined with clinical observations, patient history, and epidemiological information. The expected result is Negative.  Fact Sheet for Patients:  EntrepreneurPulse.com.au  Fact Sheet for Healthcare Providers:  IncredibleEmployment.be  This test is no t yet approved or cleared by the [place] FDA and  has been authorized for detection and/or diagnosis of SARS-CoV-2 by FDA under an Emergency Use Authorization (EUA). This EUA will remain  in effect (meaning this test can be used) for the duration of the COVID-19 declaration under Section 564(b)(1) of the Act, 21 U.S.C.section 360bbb-3(b)(1), unless the authorization is terminated  or revoked sooner.       Influenza A by PCR NEGATIVE NEGATIVE Final   Influenza B by PCR NEGATIVE NEGATIVE Final    Comment: (NOTE) The Xpert Xpress SARS-CoV-2/FLU/RSV plus assay is intended as an aid in the diagnosis of influenza from Nasopharyngeal swab specimens and should not be used as a sole basis for treatment. Nasal washings and aspirates are unacceptable for Xpert Xpress SARS-CoV-2/FLU/RSV testing.  Fact Sheet for Patients: EntrepreneurPulse.com.au  Fact Sheet for Healthcare Providers: IncredibleEmployment.be  This test is not yet approved or cleared by the [place] FDA and has been authorized for detection and/or diagnosis of SARS-CoV-2 by FDA under an Emergency Use Authorization (EUA). This EUA will remain in effect (meaning this test can be used) for the duration of  the COVID-19 declaration under Section 564(b)(1) of the Act, 21 U.S.C. section 360bbb-3(b)(1), unless the authorization is terminated or revoked.  Performed at [facility], [address] [place], [place] [address]   Surgical pcr screen     Status: None   Collection Time: 12/17/20  1:16 PM   Specimen: Nasal Mucosa; Nasal Swab  Result Value Ref Range Status   MRSA, PCR NEGATIVE NEGATIVE Final   Staphylococcus aureus NEGATIVE NEGATIVE Final    Comment: (NOTE) The Xpert SA Assay (FDA approved for NASAL specimens in patients 45 years of age and older), is one component of a comprehensive surveillance program. It is not intended to diagnose infection nor to guide or monitor treatment. Performed at [facility], [place] [address] [place], [place] [address]   Aerobic Culture w Gram Stain (superficial specimen)     Status: None (Preliminary result)   Collection Time: 12/18/20  4:12 PM   Specimen: Tissue  Result Value Ref Range Status   Specimen Description   Final    TISSUE Performed at [facility] [address] [place], [place] [address]    Special Requests   Final    NONE Performed at [facility], [place] [address], [place], [place] [address]    Gram Stain   Final    ABUNDANT WBC PRESENT, PREDOMINANTLY MONONUCLEAR ABUNDANT GRAM POSITIVE COCCI FEW GRAM NEGATIVE RODS FEW GRAM POSITIVE RODS    Culture   Final    TOO YOUNG TO READ Performed at [facility], [place] [address] [place], [place] [address]    Report Status PENDING  Incomplete      Radiology Studies: VAS [place] ABI WITH/WO TBI  Result Date: 12/17/2020  LOWER EXTREMITY DOPPLER STUDY Patient Name:  [name]  Date of Exam:   12/17/2020 Medical Rec #: [id number]       Accession #:    [id number] Date of Birth: 1980/07/11       Patient Gender: F Patient Age:   51 years Exam Location:  [facility] Procedure:      VAS [place] ABI WITH/WO TBI  Referring Phys: Armond Hang --------------------------------------------------------------------------------  Indications: Ulceration, and osteomyelitis of the second toe distal and middle              phalanges. Distal phalanx is almost completely eroded. High Risk Factors: Diabetes. Other Factors: Not on medical therapy.  Limitations: Today's exam was limited due to bandages and tachycardia. Comparison Study: No prior study Performing Technologist: Darlin Coco  Examination Guidelines: A complete evaluation includes at minimum, Doppler waveform signals and systolic blood pressure reading at the level of bilateral brachial, anterior tibial, and posterior tibial arteries, when vessel segments are accessible. Bilateral testing is considered an integral part of a complete examination. Photoelectric Plethysmograph (PPG) waveforms and toe systolic pressure readings are included as required and additional duplex testing as needed. Limited examinations for reoccurring indications may be performed as noted.  ABI Findings: +---------+------------------+-----+-----------+-------------------+ Right    Rt Pressure (mmHg)IndexWaveform   Comment             +---------+------------------+-----+-----------+-------------------+ Brachial 137                    multiphasic                    +---------+------------------+-----+-----------+-------------------+ PTA      156               1.11 multiphasic                    +---------+------------------+-----+-----------+-------------------+ DP       152               1.08 multiphasic                    +---------+------------------+-----+-----------+-------------------+ Great Toe                                  ulceration/bandages +---------+------------------+-----+-----------+-------------------+ +---------+------------------+-----+-----------+-------+ Left     Lt Pressure (mmHg)IndexWaveform   Comment  +---------+------------------+-----+-----------+-------+ Brachial 141                    multiphasic        +---------+------------------+-----+-----------+-------+ PTA      155               1.10 multiphasic        +---------+------------------+-----+-----------+-------+ DP       159               1.13 multiphasic        +---------+------------------+-----+-----------+-------+ Great Toe126               0.89                    +---------+------------------+-----+-----------+-------+  Summary: Right: Resting right ankle-brachial index is within normal range. No evidence of significant right lower extremity arterial disease. Unable to attain ABI secondary to ulceration and bandages. Left: Resting left ankle-brachial index is within normal range. No evidence of significant left lower extremity arterial disease. The left toe-brachial index is normal.  *See table(s) above for measurements and observations.  Electronically signed by Deitra Mayo MD on 12/17/2020 at 4:25:18 PM.    Final     Scheduled Meds:  feeding supplement (GLUCERNA SHAKE)  237 mL Oral TID BM   fentaNYL       gabapentin  300 mg Oral  TID   insulin aspart  0-20 Units Subcutaneous TID WC   insulin aspart  0-5 Units Subcutaneous QHS   insulin aspart  5 Units Subcutaneous TID WC   insulin glargine-yfgn  20 Units Subcutaneous QHS   multivitamin with minerals  1 tablet Oral Daily   nutrition supplement (JUVEN)  1 packet Oral BID WC   Ensure Max Protein  11 oz Oral BID   Continuous Infusions:  ceFEPime (MAXIPIME) IV 2 g (12/19/20 1139)   methocarbamol (ROBAXIN) IV     vancomycin 1,500 mg (12/19/20 0754)     LOS: 3 days   Marylu Lund, MD [facility] Pager On Amion  If 7PM-7AM, please contact night-coverage 12/19/2020, 2:50 PM

## 2020-12-19 NOTE — Progress Notes (Signed)
Pharmacy Antibiotic Note  [name] is a 40 y.o. female admitted on 12/16/2020 with  DM foot ulcer .  Pharmacy has been consulted for vanc/cefepime dosing. 12/19/2020 R foot osteomyelitis & R foot abscesses POD#1 S/p Right foot transmetatarsal amputation & Right foot incision and drainage of deep abscess Tm 99.7; WBC 19.8; Scr 0.81 12/5 OR tissue culture w/ GPC, GNR, GPR   Plan: Continue vancomycin  1500mg  IV q12 (goal AUC 400-550) Continue Cefepime 2g IV q8  F/u renal function, WBC, temp F/u culture data and ability to narrow abx   Height: 5\' 9"  (175.3 cm) Weight: (!) 138.9 kg (306 lb 3.5 oz) IBW/kg (Calculated) : 66.2  Temp (24hrs), Avg:99 F (37.2 C), Min:98.3 F (36.8 C), Max:99.7 F (37.6 C)  Recent Labs  Lab 12/16/20 0345 12/17/20 0425 12/18/20 0434 12/19/20 0445  WBC 19.9* 18.1* 16.1* 19.8*  CREATININE 0.87 0.84  --  0.81     Estimated Creatinine Clearance: 140.3 mL/min (by C-G formula based on SCr of 0.81 mg/dL).    Allergies  Allergen Reactions   Mangifera Indica Rash    FRESH MANGO  Antimicrobials this admission: 12/3 vanc >> 12/3 cefepime >>  Dose adjustments this admission:  Microbiology results: 12/3 BCx2 ngtd 12/4 MRSA - MSSA - 12/5 OR tissue: abundant WBC, abundant GPC, few GNR, few GPR 12/5 AFB tissue: sent 12/5 fungus stain tissue: sent  Thank you for allowing pharmacy to be a part of this patient's care.  14/5, Pharm.D 12/19/2020 11:45 AM

## 2020-12-19 NOTE — Progress Notes (Signed)
Patient complaining of 10/10 pain. NP notified and notes okay to give oxycodone early.

## 2020-12-20 ENCOUNTER — Inpatient Hospital Stay: Payer: Self-pay

## 2020-12-20 DIAGNOSIS — E785 Hyperlipidemia, unspecified: Secondary | ICD-10-CM | POA: Diagnosis not present

## 2020-12-20 DIAGNOSIS — L089 Local infection of the skin and subcutaneous tissue, unspecified: Secondary | ICD-10-CM

## 2020-12-20 DIAGNOSIS — M869 Osteomyelitis, unspecified: Secondary | ICD-10-CM

## 2020-12-20 DIAGNOSIS — E1165 Type 2 diabetes mellitus with hyperglycemia: Secondary | ICD-10-CM | POA: Diagnosis not present

## 2020-12-20 LAB — CBC
HCT: 34.8 % — ABNORMAL LOW (ref 36.0–46.0)
Hemoglobin: 10.8 g/dL — ABNORMAL LOW (ref 12.0–15.0)
MCH: 26.7 pg (ref 26.0–34.0)
MCHC: 31 g/dL (ref 30.0–36.0)
MCV: 85.9 fL (ref 80.0–100.0)
Platelets: 305 10*3/uL (ref 150–400)
RBC: 4.05 MIL/uL (ref 3.87–5.11)
RDW: 14.6 % (ref 11.5–15.5)
WBC: 17.3 10*3/uL — ABNORMAL HIGH (ref 4.0–10.5)
nRBC: 0 % (ref 0.0–0.2)

## 2020-12-20 LAB — AEROBIC CULTURE W GRAM STAIN (SUPERFICIAL SPECIMEN)

## 2020-12-20 LAB — LIPID PANEL
Cholesterol: 92 mg/dL (ref 0–200)
HDL: 13 mg/dL — ABNORMAL LOW (ref >40)
LDL Cholesterol: 58 mg/dL (ref 0–99)
Total CHOL/HDL Ratio: 7.1 RATIO
Triglycerides: 103 mg/dL (ref <150)
VLDL: 21 mg/dL (ref 0–40)

## 2020-12-20 LAB — COMPREHENSIVE METABOLIC PANEL
ALT: 12 U/L (ref 0–44)
AST: 22 U/L (ref 15–41)
Albumin: 2.3 g/dL — ABNORMAL LOW (ref 3.5–5.0)
Alkaline Phosphatase: 94 U/L (ref 38–126)
Anion gap: 11 (ref 5–15)
BUN: 16 mg/dL (ref 6–20)
CO2: 25 mmol/L (ref 22–32)
Calcium: 8.4 mg/dL — ABNORMAL LOW (ref 8.9–10.3)
Chloride: 92 mmol/L — ABNORMAL LOW (ref 98–111)
Creatinine, Ser: 0.7 mg/dL (ref 0.44–1.00)
GFR, Estimated: >60 mL/min (ref >60)
Glucose, Bld: 369 mg/dL — ABNORMAL HIGH (ref 70–99)
Potassium: 4.2 mmol/L (ref 3.5–5.1)
Sodium: 128 mmol/L — ABNORMAL LOW (ref 135–145)
Total Bilirubin: 0.7 mg/dL (ref 0.3–1.2)
Total Protein: 7.7 g/dL (ref 6.5–8.1)

## 2020-12-20 LAB — GLUCOSE, CAPILLARY
Glucose-Capillary: 307 mg/dL — ABNORMAL HIGH (ref 70–99)
Glucose-Capillary: 228 mg/dL — ABNORMAL HIGH (ref 70–99)
Glucose-Capillary: 300 mg/dL — ABNORMAL HIGH (ref 70–99)
Glucose-Capillary: 218 mg/dL — ABNORMAL HIGH (ref 70–99)

## 2020-12-20 LAB — SURGICAL PATHOLOGY

## 2020-12-20 MED ORDER — INSULIN GLARGINE-YFGN 100 UNIT/ML ~~LOC~~ SOLN
24.0000 [IU] | Freq: Every day | SUBCUTANEOUS | Status: DC
  Filled 2020-12-20: qty 0.24

## 2020-12-20 MED ORDER — INSULIN ASPART 100 UNIT/ML IJ SOLN: 8.0000 [IU] | Freq: Three times a day (TID) | INTRAMUSCULAR | Status: DC

## 2020-12-20 MED ORDER — INSULIN ASPART PROT & ASPART (70-30 MIX) 100 UNIT/ML ~~LOC~~ SUSP
20.0000 [IU] | Freq: Two times a day (BID) | SUBCUTANEOUS | Status: DC
  Administered 2020-12-20: 20 [IU] via SUBCUTANEOUS
  Filled 2020-12-20: qty 10

## 2020-12-20 MED ORDER — METRONIDAZOLE 500 MG PO TABS
500.0000 mg | ORAL_TABLET | Freq: Two times a day (BID) | ORAL | Status: DC
  Administered 2020-12-20 – 2020-12-23 (×7): 500 mg via ORAL
  Filled 2020-12-20 (×7): qty 1

## 2020-12-20 MED ORDER — CEFTRIAXONE SODIUM 2 G IJ SOLR
2.0000 g | INTRAMUSCULAR | Status: DC
  Administered 2020-12-20 – 2020-12-21 (×2): 2 g via INTRAVENOUS
  Filled 2020-12-20 (×3): qty 20

## 2020-12-20 MED ORDER — SODIUM CHLORIDE 0.9 % IV SOLN: INTRAVENOUS | Status: DC

## 2020-12-20 NOTE — Progress Notes (Signed)
PHARMACY CONSULT NOTE FOR:  OUTPATIENT  PARENTERAL ANTIBIOTIC THERAPY (OPAT)  Indication: Diabetic foot infection/osteomyelitis  Regimen: Ceftriaxone 2 gm IV Q 24 hours for 6 weeks Metronidazole 500 mg PO BID X 2 weeks End date: 01/03/21 for metronidazole, 01/27/21 for ceftriaxone   IV antibiotic discharge orders are pended. To discharging provider:  please sign these orders via discharge navigator,  Select New Orders & click on the button choice - Manage This Unsigned Work.     Thank you for allowing pharmacy to be a part of this patient's care.  Sharin Mons, PharmD, BCPS, BCIDP Infectious Diseases Clinical Pharmacist Phone: [phone] 12/20/2020, 2:11 PM

## 2020-12-20 NOTE — Progress Notes (Signed)
Nutrition Follow-up  DOCUMENTATION CODES:   Morbid obesity  INTERVENTION:   -Ensure MAX Protein po BID, each supplement provides 150 kcal and 30 grams of protein   -Juven BID, each serving provides 95kcal and 2.5g of protein (amino acids glutamine and arginine)  -Multivitamin with minerals daily  NUTRITION DIAGNOSIS:   Increased nutrient needs related to wound healing as evidenced by estimated needs.  Ongoing.  GOAL:   Patient will meet greater than or equal to 90% of their needs  Progressing.  MONITOR:   PO intake, Supplement acceptance, Labs, Weight trends, I & O's  REASON FOR ASSESSMENT:   Consult Diet education  ASSESSMENT:   40 y.o. female with medical history significant of class III obesity, hyperlipidemia, type 2 diabetes currently not on medical therapy who is coming from [facility] emergency department where she presented with that week history of worsening chronic toe ulcer, which she has had for the past year plus, now coming with increased pain, development of purulent discharge and discoloration of second toe.Pt was admitted for concerns of osteomyelitis of R foot.  12/5: s/p rt foot transmetatarsal amputation, rt foot I&D of deep abscess  RD consulted for diet education regarding diabetes diet. Pt reports not following a diet and eating and drinking what she wanted PTA. Pt states she plans to change this and is interested in information.  Pt reports she was drinking excessive amounts of sweet tea and soda as she felt very thirsty. Began switching to water and protein shakes PTA.  Reports she would sometimes go all day without eating until dinnertime.  RD provided "Carbohydrate Counting for People with Diabetes" handout from the Academy of Nutrition and Dietetics. Discussed different food groups and their effects on blood sugar, emphasizing carbohydrate-containing foods. Provided list of carbohydrates and recommended serving sizes of common  foods.  Discussed importance of controlled and consistent carbohydrate intake throughout the day. Provided examples of ways to balance meals/snacks and encouraged intake of high-fiber, whole grain complex carbohydrates. Teach back method used.  Expect good compliance. Pt reports she feels better about the diet now.  Pt reports drinking both Glucerna and Ensure Max supplements.   Admission weight: 306 lbs.  Medications: Multivitamin with minerals daily  Labs reviewed:  CBGs: 181-307 Low Na  NUTRITION - FOCUSED PHYSICAL EXAM:  No depletions noted.  Diet Order:   Diet Order             Diet Carb Modified Fluid consistency: Thin; Room service appropriate? Yes  Diet effective now                   EDUCATION NEEDS:   Not appropriate for education at this time  Skin:  Skin Assessment: Skin Integrity Issues: Skin Integrity Issues:: Diabetic Ulcer Diabetic Ulcer: right foot  Last BM:  12/2  Height:   Ht Readings from Last 1 Encounters:  12/16/20 5\' 9"  (1.753 m)    Weight:   Wt Readings from Last 1 Encounters:  12/18/20 (!) 138.9 kg    BMI:  Body mass index is 45.22 kg/m.  Estimated Nutritional Needs:   Kcal:  2100-2300  Protein:  100-115g  Fluid:  2.3L/day   14/05/22, MS, RD, LDN Inpatient Clinical Dietitian Contact information available via Amion

## 2020-12-20 NOTE — Progress Notes (Signed)
Inpatient Diabetes Program Recommendations  AACE/ADA: New Consensus Statement on Inpatient Glycemic Control (2015)  Target Ranges:  Prepandial:   less than 140 mg/dL      Peak postprandial:   less than 180 mg/dL (1-2 hours)      Critically ill patients:  140 - 180 mg/dL   Lab Results  Component Value Date   GLUCAP 307 (H) 12/20/2020   HGBA1C 13.5 (H) 12/16/2020    Review of Glycemic Control  Diabetes history: DM 2 Outpatient Diabetes medications: None Current orders for Inpatient glycemic control:  Semlgee 24 units Novolog 8 units tid meal coverage Novolog 0-20 units tid + hs  Glucerna tid between meals  No insurance noted, Suggest 70/30 insulin bid at d/c due to need of meal coverage as well. No PCP noted  Inpatient Diabetes Program Recommendations:    -  Needs toc consult and appt made not tell pt to call for an appt.  Spoke with pt at bedside regarding A1c level and glucose control at home. Discussed lifestyle modifications including diet and exercise when cleared by MD. Discussed how and when to check her glucose. Discussed how and when to take insulin. Showed pt vial and syringe and insulin pen ways of administering insulin. Pt is more easily able to afford vial and syringe.   Pt instructed to get glucose meter kit over the counter from [facility]  D/C planning: Syringes 0.5 ml- (Order # [id number]) Novolin ReliOn 70/30 Insulin vial (Order [id number])  Thanks,  Tama Headings RN, MSN, BC-ADM Inpatient Diabetes Coordinator Team Pager [phone] (8a-5p)

## 2020-12-20 NOTE — TOC Initial Note (Signed)
Transition of Care [facility]) - Initial/Assessment Note   Patient Details  Name: [name] MRN: [id number] Date of Birth: [birth year]  [facility] CM/SW Contact:    Ewing Schlein, LCSW Phone Number: 12/20/2020, 2:31 PM  Clinical Narrative: Patient will need to discharge home with IV antibiotics. TOC also received a consult for a hospital follow up appointment. CSW spoke with patient who is agreeable to a referral to a [facility] clinic as patient is uninsured and does not currently have a PCP. CSW made IV antibiotics referral to [facility] with [facility]. OPAT order is in, but will need to be signed.  CSW scheduled patient at [facility] for Friday January 12, 2021 at 9:30am. Patient will see Gwinda Passe. CSW provided appointment information to patient and it was added to the AVS. Patient also aware [facility] will provide IV antibiotics. TOC to follow.  Expected Discharge Plan: Home w Home Health Services Barriers to Discharge: Continued Medical Work up, Inadequate or no insurance  Patient Goals and CMS Choice Patient states their goals for this hospitalization and ongoing recovery are:: Return home CMS Medicare.gov Compare Post Acute Care list provided to:: Patient Choice offered to / list presented to : Patient  Expected Discharge Plan and Services Expected Discharge Plan: Home w Home Health Services In-house Referral: Clinical Social Work Post Acute Care Choice: Home Health Living arrangements for the past 2 months: Single Family Home             DME Arranged: N/A DME Agency: NA HH Arranged: IV Antibiotics, RN HH Agency: [facility] Date HH Agency Contacted: 12/20/20 Representative spoke with at [facility] Agency: Pam  Prior Living Arrangements/Services Living arrangements for the past 2 months: Single Family Home Patient language and need for interpreter reviewed:: Yes Do you feel safe going back to the place where you live?: Yes      Need for Family Participation in  Patient Care: Yes (Comment) Care giver support system in place?: Yes (comment) Criminal Activity/Legal Involvement Pertinent to Current Situation/Hospitalization: No - Comment as needed  Activities of Daily Living Home Assistive Devices/Equipment: Eyeglasses ADL Screening (condition at time of admission) Patient's cognitive ability adequate to safely complete daily activities?: Yes Is the patient deaf or have difficulty hearing?: No Does the patient have difficulty seeing, even when wearing glasses/contacts?: No Does the patient have difficulty concentrating, remembering, or making decisions?: No Patient able to express need for assistance with ADLs?: Yes Does the patient have difficulty dressing or bathing?: No Independently performs ADLs?: Yes (appropriate for developmental age) Does the patient have difficulty walking or climbing stairs?: Yes (with diabetic toe-- sore) Weakness of Legs: None Weakness of Arms/Hands: None  Permission Sought/Granted Permission sought to share information with : Facility Industrial/product designer granted to share information with : Yes, Verbal Permission Granted Permission granted to share info w AGENCY: [facility], [facility]  Emotional Assessment Appearance:: Appears stated age Attitude/Demeanor/Rapport: Engaged Affect (typically observed): Tearful/Crying Orientation: : Oriented to Self, Oriented to Place, Oriented to  Time, Oriented to Situation Alcohol / Substance Use: Not Applicable Psych Involvement: No (comment)  Admission diagnosis:  Osteomyelitis of right foot, unspecified type (HCC) [M86.9] Osteomyelitis of second toe of right foot (HCC) [M86.9] Patient Active Problem List   Diagnosis Date Noted   Osteomyelitis of second toe of right foot (HCC) 12/16/2020   Type 2 diabetes mellitus with hyperglycemia (HCC) 12/16/2020   Class 3 obesity (HCC) 12/16/2020   Hyperlipidemia 12/16/2020   PCP:  Pcp, No Pharmacy:  [facility] [address] [place] [address] Phone: [phone] Fax: [phone]  [facility] [address] [place], [place] - [address] [place] [address] Phone: [phone] Fax: [phone]  Readmission Risk Interventions No flowsheet data found.

## 2020-12-20 NOTE — Progress Notes (Signed)
Arrived to discuss PICC placement including risks, benefits, and alternatives. Patient requested to wait until 12-8 AM for placement so she could speak with her mom and get some rest. Primary RN notified. VAST to follow up on 12-8 AM.

## 2020-12-20 NOTE — Progress Notes (Addendum)
PROGRESS NOTE    [name]  NIO:[id number] DOB: [birth year] DOA: 12/16/2020 PCP: Pcp, No    Chief Complaint  Patient presents with   Foot Pain    Brief Narrative:  40 y.o. female with medical history significant of class III obesity, hyperlipidemia, type 2 diabetes currently not on medical therapy who is coming from [facility] emergency department where she presented with that week history of worsening chronic toe ulcer, which she has had for the past year plus, now coming with increased pain, development of purulent discharge and discoloration of second toe.  She does not remember having any trauma to the area.  No fever, chills, but feels fatigue.  No rhinorrhea, sore throat, wheezing, dyspnea or hemoptysis.  No chest pain, palpitations, diaphoresis, PND, orthopnea or pitting edema of the lower extremities.  She has some nausea and vomiting earlier today.  No abdominal pain, diarrhea, constipation, melena or hematochezia.  No dysuria, frequency or hematuria.  She does not check her blood glucose regularly and has not been under treatment recently.  She denied polyuria, polydipsia, polyphagia or blurred vision.  She does not have a PCP currently. Pt was admitted for concerns of osteomyelitis of R foot. 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 ID consulted for antibiotic recommendations and duration     Assessment & Plan:   Principal Problem:   Osteomyelitis of second toe of right foot (HCC) Active Problems:   Type 2 diabetes mellitus with hyperglycemia (HCC)   Class 3 obesity (HCC)   Hyperlipidemia  #1 osteomyelitis of second toe of the right foot -MRI on admission with findings concerning for osteomyelitis of the phalanges and metatarsal of the second ray with soft tissue infection.  Abscess in dorsal flexor hallucis brevis and regional myositis and extensive cellulitis of forefoot and  toes. -Patient seen in consultation by orthopedics, underwent ABIs. -Patient is status post right foot transmetatarsal amputation and I&D of deep abscess. -Cultures performed perioperatively with abundant Streptococcus agalactiae. -Patient being followed by orthopedics who recommended nonweightbearing to right lower extremity, extended course of IV antibiotics given extent of infection and poorly controlled diabetes, ID recommendations per orthopedics, dressing changes. -ID consulted and recommended de-escalation of IV antibiotics to Rocephin and plan for 6 weeks of treatment from date of amputation. -ID also recommending 2 weeks of oral Flagyl. -ID recommending PICC line placement with outpatient follow-up with ID in the outpatient setting. -Outpatient follow-up with orthopedics. -PT/OT evaluation.  2.  Poorly controlled diabetes mellitus type 2 -Hemoglobin A1c 13.5 (12/16/2020) -CBG noted at 307 this morning. -Patient with no insurance, difficulty getting medications and as such we will place on 70/30 20 units twice daily, SSI. -Discontinue Semglee and new coverage NovoLog. -Continue Neurontin. -Diabetes coordinator following.  3.  Obesity -Lifestyle modification. -Outpatient follow-up with PCP.  4.  Hyperlipidemia -Fasting lipid panel with LDL of 58. -Outpatient follow-up.  5.  Hyponatremia -Check UA, check urine electrolytes, check urine osmolality. -IV fluids. -Repeat labs in the morning.   DVT prophylaxis: SCDs Code Status: Full Family Communication: Updated patient.  No family at bedside. Disposition:   Status is: Inpatient  Remains inpatient appropriate because: Severity of illness       Consultants:  ID: Dr. Elinor Parkinson 12/20/2020 Orthopedics: Dr.Ramanathan 12/17/2020  Procedures:  Plain films of the right foot 12/16/2020 Plain films of the right foot 12/17/2020 ABI 12/17/2020 Right foot transmetatarsal amputation/right foot incision and drainage of deep abscess  per Dr.  Kathaleen Bury, orthopedics 12/18/2020  Antimicrobials:  Oral Flagyl 12/20/2020>>> IV cefepime 12/16/2020>>>>> 12/20/2020 IV Rocephin 12/20/2020>>>> IV vancomycin 12/16/2020>>>> 12/20/2020   Subjective: Sitting up in bed.  States they are working on managing her pain better in her right foot.  No chest pain.  No shortness of breath.  No abdominal pain.  Tolerating current diet  Objective: Vitals:   12/19/20 0929 12/19/20 1301 12/19/20 2127 12/20/20 0548  BP: (!) 147/90 (!) 160/100 (!) 153/89 (!) 152/83  Pulse: 97 100 (!) 108 94  Resp: 18 18 20 18   Temp: 98.6 F (37 C) 99 F (37.2 C) 98.4 F (36.9 C) 99.5 F (37.5 C)  TempSrc: Oral Oral Oral Oral  SpO2: 97% 95% 100% 97%  Weight:      Height:        Intake/Output Summary (Last 24 hours) at 12/20/2020 1235 Last data filed at 12/20/2020 0900 Gross per 24 hour  Intake 1750 ml  Output 2700 ml  Net -950 ml   Filed Weights   12/16/20 1358 12/18/20 1211  Weight: (!) 138.9 kg (!) 138.9 kg    Examination:  General exam: Appears calm and comfortable  Respiratory system: Clear to auscultation. Respiratory effort normal. Cardiovascular system: S1 & S2 heard, RRR. No JVD, murmurs, rubs, gallops or clicks. No pedal edema. Gastrointestinal system: Abdomen is nondistended, soft and nontender. No organomegaly or masses felt. Normal bowel sounds heard. Central nervous system: Alert and oriented. No focal neurological deficits. Extremities: Status post right transmetatarsal amputation with right foot in bandage.  Skin: No rashes, lesions or ulcers Psychiatry: Judgement and insight appear normal. Mood & affect appropriate.     Data Reviewed: I have personally reviewed following labs and imaging studies  CBC: Recent Labs  Lab 12/16/20 0345 12/17/20 0425 12/18/20 0434 12/19/20 0445 12/20/20 0434  WBC 19.9* 18.1* 16.1* 19.8* 17.3*  NEUTROABS 16.3*  --   --   --   --   HGB 12.7 11.5* 12.3 10.8* 10.8*  HCT 39.0 35.0* 38.5 34.3*  34.8*  MCV 82.3 83.7 85.0 84.5 85.9  PLT 444* 351 337 324 [id number]    Basic Metabolic Panel: Recent Labs  Lab 12/16/20 0345 12/17/20 0425 12/19/20 0445 12/20/20 0434  NA 128* 129* 129* 128*  K 4.3 3.8 4.2 4.2  CL 87* 97* 96* 92*  CO2 22 22 22 25   GLUCOSE 393* 231* 362* 369*  BUN 15 17 14 16   CREATININE 0.87 0.84 0.81 0.70  CALCIUM 8.9 8.2* 8.2* 8.4*    GFR: Estimated Creatinine Clearance: 142 mL/min (by C-G formula based on SCr of 0.7 mg/dL).  Liver Function Tests: Recent Labs  Lab 12/16/20 0345 12/17/20 0425 12/19/20 0445 12/20/20 0434  AST 13* 18 22 22   ALT 9 10 12 12   ALKPHOS 112 86 84 94  BILITOT 0.9 0.9 0.9 0.7  PROT 8.6* 7.2 7.3 7.7  ALBUMIN 2.6* 2.3* 2.1* 2.3*    CBG: Recent Labs  Lab 12/19/20 1122 12/19/20 1614 12/19/20 2124 12/20/20 0740 12/20/20 1213  GLUCAP 181* 291* 296* 307* 228*     Recent Results (from the past 240 hour(s))  Blood culture (routine x 2)     Status: None (Preliminary result)   Collection Time: 12/16/20  4:15 AM   Specimen: BLOOD  Result Value Ref Range Status   Specimen Description   Final    BLOOD RIGHT ANTECUBITAL Performed at [facility], [address], [place], [place] [address]    Special Requests   Final  BOTTLES DRAWN AEROBIC AND ANAEROBIC Blood Culture results may not be optimal due to an inadequate volume of blood received in culture bottles Performed at [facility], [address] [place], [place] [address]    Culture   Final    NO GROWTH 4 DAYS Performed at [facility], [place] [address] [place], [place] [address]    Report Status PENDING  Incomplete  Blood culture (routine x 2)     Status: None (Preliminary result)   Collection Time: 12/16/20  4:20 AM   Specimen: BLOOD  Result Value Ref Range Status   Specimen Description   Final    BLOOD RIGHT FOREARM Performed at [facility], [address] [place], [place] [address]    Special Requests   Final    BOTTLES  DRAWN AEROBIC AND ANAEROBIC Blood Culture results may not be optimal due to an inadequate volume of blood received in culture bottles Performed at [facility], [address] [place], [place] [address]    Culture   Final    NO GROWTH 4 DAYS Performed at [facility], [place] [address] [place], [place] [address]    Report Status PENDING  Incomplete  Resp Panel by RT-PCR (Flu A&B, Covid) Nasopharyngeal Swab     Status: None   Collection Time: 12/16/20  5:20 AM   Specimen: Nasopharyngeal Swab; Nasopharyngeal(NP) swabs in vial transport medium  Result Value Ref Range Status   SARS Coronavirus 2 by RT PCR NEGATIVE NEGATIVE Final    Comment: (NOTE) SARS-CoV-2 target nucleic acids are NOT DETECTED.  The SARS-CoV-2 RNA is generally detectable in upper respiratory specimens during the acute phase of infection. The lowest concentration of SARS-CoV-2 viral copies this assay can detect is 138 copies/mL. A negative result does not preclude SARS-Cov-2 infection and should not be used as the sole basis for treatment or other patient management decisions. A negative result may occur with  improper specimen collection/handling, submission of specimen other than nasopharyngeal swab, presence of viral mutation(s) within the areas targeted by this assay, and inadequate number of viral copies(<138 copies/mL). A negative result must be combined with clinical observations, patient history, and epidemiological information. The expected result is Negative.  Fact Sheet for Patients:  EntrepreneurPulse.com.au  Fact Sheet for Healthcare Providers:  IncredibleEmployment.be  This test is no t yet approved or cleared by the [place] FDA and  has been authorized for detection and/or diagnosis of SARS-CoV-2 by FDA under an Emergency Use Authorization (EUA). This EUA will remain  in effect (meaning this test can be used) for the duration of the COVID-19  declaration under Section 564(b)(1) of the Act, 21 U.S.C.section 360bbb-3(b)(1), unless the authorization is terminated  or revoked sooner.       Influenza A by PCR NEGATIVE NEGATIVE Final   Influenza B by PCR NEGATIVE NEGATIVE Final    Comment: (NOTE) The Xpert Xpress SARS-CoV-2/FLU/RSV plus assay is intended as an aid in the diagnosis of influenza from Nasopharyngeal swab specimens and should not be used as a sole basis for treatment. Nasal washings and aspirates are unacceptable for Xpert Xpress SARS-CoV-2/FLU/RSV testing.  Fact Sheet for Patients: EntrepreneurPulse.com.au  Fact Sheet for Healthcare Providers: IncredibleEmployment.be  This test is not yet approved or cleared by the [place] FDA and has been authorized for detection and/or diagnosis of SARS-CoV-2 by FDA under an Emergency Use Authorization (EUA). This EUA will remain in effect (meaning this test can  be used) for the duration of the COVID-19 declaration under Section 564(b)(1) of the Act, 21 U.S.C. section 360bbb-3(b)(1), unless the authorization is terminated or revoked.  Performed at [facility], [address] [place], [place] [address]   Surgical pcr screen     Status: None   Collection Time: 12/17/20  1:16 PM   Specimen: Nasal Mucosa; Nasal Swab  Result Value Ref Range Status   MRSA, PCR NEGATIVE NEGATIVE Final   Staphylococcus aureus NEGATIVE NEGATIVE Final    Comment: (NOTE) The Xpert SA Assay (FDA approved for NASAL specimens in patients 41 years of age and older), is one component of a comprehensive surveillance program. It is not intended to diagnose infection nor to guide or monitor treatment. Performed at [facility], [place] [address], [place], [place] [address]   Aerobic Culture w Gram Stain (superficial specimen)     Status: None   Collection Time: 12/18/20  4:12 PM   Specimen: Tissue  Result Value Ref Range Status    Specimen Description   Final    TISSUE Performed at [place] [address] [place], [place] [address]    Special Requests   Final    NONE Performed at [facility], [place] [address] [place], [place] [address]    Gram Stain   Final    ABUNDANT WBC PRESENT, PREDOMINANTLY MONONUCLEAR ABUNDANT GRAM POSITIVE COCCI FEW GRAM NEGATIVE RODS FEW GRAM POSITIVE RODS    Culture   Final    ABUNDANT STREPTOCOCCUS AGALACTIAE TESTING AGAINST S. AGALACTIAE NOT ROUTINELY PERFORMED DUE TO PREDICTABILITY OF AMP/PEN/VAN SUSCEPTIBILITY. Performed at [facility], [place] [address] [place], [place] [address]    Report Status 12/20/2020 FINAL  Final         Radiology Studies: No results found.      Scheduled Meds:  feeding supplement (GLUCERNA SHAKE)  237 mL Oral TID BM   gabapentin  300 mg Oral TID   insulin aspart  0-20 Units Subcutaneous TID WC   insulin aspart  0-5 Units Subcutaneous QHS   insulin aspart  8 Units Subcutaneous TID WC   insulin glargine-yfgn  24 Units Subcutaneous QHS   metroNIDAZOLE  500 mg Oral Q12H   multivitamin with minerals  1 tablet Oral Daily   nutrition supplement (JUVEN)  1 packet Oral BID WC   Ensure Max Protein  11 oz Oral BID   Continuous Infusions:  sodium chloride 125 mL/hr at 12/20/20 0858   cefTRIAXone (ROCEPHIN)  IV     methocarbamol (ROBAXIN) IV 500 mg (12/20/20 0612)   vancomycin 1,500 mg (12/20/20 0902)     LOS: 4 days    Time spent: 40 minutes    Irine Seal, MD [facility]   To contact the attending provider between 7A-7P or the covering provider during after hours 7P-7A, please log into the web site [email or web address] and access using universal [employer] password for that web site. If you do not have the password, please call the hospital operator.  12/20/2020, 12:35 PM

## 2020-12-20 NOTE — Consult Note (Signed)
[facility] for Infectious Diseases                                                                                        Patient Identification: Patient Name: [name] MRN: [id number] [place] Date: 12/16/2020  3:04 AM Today's Date: 12/20/2020 Reason for consult: Osteomyelitis  Requesting provider: Marylu Lund  Principal Problem:   Osteomyelitis of second toe of right foot [facility]) Active Problems:   Type 2 diabetes mellitus with hyperglycemia ([place])   Class 3 obesity ([place])   Hyperlipidemia   Antibiotics: Vancomycin 12/2-                    Cefepime 12/2-  Lines/Hardware:  Assessment DFU/osteomyelitis of second ray including necrosis and abscess -S/p right foot transmetatarsal amputation, right foot I&D of deep abscess.  OR cultures in process.  Gram stain with GPC, GNR and G PR - ABI -unremarkable for significant arterial disease  Uncontrolled DM-A1c 13.5 Obesity  Recommendations  Will de-escalate antibiotics to ceftriaxone - plan for 6 weeks from date of OR Continue p.o. Flagyl, 2 weeks total  PICC line Post op wound care per Ortho  ID pharmacy to place OPAT orders A follow up with [facility] has been made ID available as needed  OPAT Orders Diagnosis: DFU and Osteomyelitis   Culture Result: Strep agalactiae  Allergies  Allergen Reactions   Mangifera Indica Rash    FRESH MANGO    OPAT Orders Discharge antibiotics to be given via PICC line Discharge antibiotics: Ceftriaxone 2 G IV daily  Per pharmacy protocol  Aim for Vancomycin trough 15-20 or AUC 400-550 (unless otherwise indicated) Duration: 6 weeks  End Date: 01/27/21 [facility] Care Per Protocol:  Home health RN for IV administration and teaching; PICC line care and labs.    Labs weekly while on IV antibiotics: X_ CBC with differential __ BMP X__ CMP X__ CRP X__ ESR __ Vancomycin trough __ CK  __ Please pull PIC at completion of IV  antibiotics X__ Please leave PIC in place until doctor has seen patient or been notified  Fax weekly labs to [phone]  Clinic Follow Up Appt: 12/9  @ 9: 30 am   Rest of the management as per the primary team. Please call with questions or concerns.  Thank you for the consult  Rosiland Oz, MD Infectious Disease Physician [facility] Infectious [facility] [address] [place], [place] [address] Phone: [phone]  Fax: [phone]  __________________________________________________________________________________________________________ HPI and Hospital Course: 40 year old female with PMH of obesity, hyperlipidemia, type II DM, uncontrolled who presented to [place] ED from [place] ED on 12/3 where she initially presented with worsening of chronic right foot ulcer associated with fevers and chills.  She tells me she had ulcer in her right foot last year that healed up but started as a blister in August 2022 and did not heal thereafter.  She had increasing pain, discoloration of right second toe and purulent drainage over last few days prior to admission.  Denies any trauma or injury to the right foot.  Subjective fever and chills  with some nausea and vomiting. Denis diarrhea. She was diagnosed with DM in her 59s and has lost follow up care for DM. Smokes marijuana, alcohol occasionally and denies IVDU. Lives with her partner.   At ED, afebrile Labs remarkable for NA 128, BG 393, WBC 19.9 Imagings as below S/p right foot transmetatarsal amputation, right foot I&D of deep abscess.  OR cultures in process.  Gram stain with GPC, GNR and G PR Currently on vancomycin, cefepime and metronidazole  ROS: General- Denies fever, chills, loss of appetite and loss of weight HEENT - Denies headache, blurry vision, neck pain, sinus pain Chest - Denies any chest pain, SOB or cough CVS- Denies any dizziness/lightheadedness, syncopal attacks,  palpitations Abdomen- Denies abdominal pain, hematochezia and diarrhea, nausea and vomiting has resolved  Neuro - Denies any weakness, numbness, tingling sensation Psych - Denies any changes in mood irritability or depressive symptoms GU- Denies any burning, dysuria, hematuria or increased frequency of urination Skin - denies any rashes/lesions MSK - Pain at the TMA site   Past Medical History:  Diagnosis Date   Class 3 obesity ([place]) 12/16/2020   Diabetes mellitus    Past Surgical History:  Procedure Laterality Date   EXTREMITY CYST EXCISION     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   Procedure: TRANSMETATARSAL AMPUTATION;  Surgeon: Armond Hang, MD;  Location: [facility];  Service: Orthopedics;  Laterality: Right;     Scheduled Meds:  feeding supplement (GLUCERNA SHAKE)  237 mL Oral TID BM   gabapentin  300 mg Oral TID   insulin aspart  0-20 Units Subcutaneous TID WC   insulin aspart  0-5 Units Subcutaneous QHS   insulin aspart  5 Units Subcutaneous TID WC   insulin glargine-yfgn  20 Units Subcutaneous QHS   multivitamin with minerals  1 tablet Oral Daily   nutrition supplement (JUVEN)  1 packet Oral BID WC   Ensure Max Protein  11 oz Oral BID   Continuous Infusions:  ceFEPime (MAXIPIME) IV 2 g (12/20/20 0335)   methocarbamol (ROBAXIN) IV 500 mg (12/20/20 0612)   vancomycin Stopped (12/19/20 2327)   PRN Meds:.acetaminophen **OR** acetaminophen, HYDROmorphone (DILAUDID) injection, methocarbamol (ROBAXIN) IV, ondansetron **OR** ondansetron (ZOFRAN) IV, oxyCODONE, prochlorperazine  Allergies  Allergen Reactions   Mangifera Indica Rash    FRESH MANGO    Social History   Socioeconomic History   Marital status: Single    Spouse name: Not on file   Number of children: Not on file   Years of education: Not on file   Highest education level: Not on file  Occupational History   Not on file  Tobacco Use   Smoking status: Never   Smokeless tobacco: Not on file   Vaping Use   Vaping Use: Never used  Substance and Sexual Activity   Alcohol use: Yes    Comment: occ   Drug use: Yes    Types: Marijuana   Sexual activity: Not on file  Other Topics Concern   Not on file  Social History Narrative   Not on file   Social Determinants of Health   Financial Resource Strain: Not on file  Food Insecurity: Not on file  Transportation Needs: Not on file  Physical Activity: Not on file  Stress: Not on file  Social Connections: Not on file  Intimate Partner Violence: Not on file   Family History  Problem Relation Age of Onset   Hypertension Mother    Diabetes Mother    Diabetes Other  Vitals BP (!) 152/83 (BP Location: Left Arm)   Pulse 94   Temp 99.5 F (37.5 C) (Oral)   Resp 18   Ht 5' 9"  (1.753 m)   Wt (!) 138.9 kg   LMP 12/09/2020 (Approximate)   SpO2 97%   BMI 45.22 kg/m    Physical Exam Constitutional: Lying in bed, not in acute distress    comments: Obese   Cardiovascular:     Rate and Rhythm: Normal rate and regular rhythm.     Heart sounds:    Pulmonary:     Effort: Pulmonary effort is normal.     Comments:   Abdominal:     Palpations: Abdomen is soft.     Tenderness: Nontender and nondistended  Musculoskeletal:        General:    Skin:    Comments:   Neurological:     General: Grossly nonfocal.  Awake alert and oriented  Psychiatric:        Mood and Affect: Mood normal.  Calm and cooperative  Pertinent Microbiology Results for orders placed or performed during the hospital encounter of 12/16/20  Blood culture (routine x 2)     Status: None (Preliminary result)   Collection Time: 12/16/20  4:15 AM   Specimen: BLOOD  Result Value Ref Range Status   Specimen Description   Final    BLOOD RIGHT ANTECUBITAL Performed at [facility], [address] [place], [place] [address]    Special Requests   Final    BOTTLES DRAWN AEROBIC AND ANAEROBIC Blood Culture results may not be optimal due to  an inadequate volume of blood received in culture bottles Performed at [facility], [address] [place], [place] [address]    Culture   Final    NO GROWTH 4 DAYS Performed at [facility], [place] [address] [place], [place] [address]    Report Status PENDING  Incomplete  Blood culture (routine x 2)     Status: None (Preliminary result)   Collection Time: 12/16/20  4:20 AM   Specimen: BLOOD  Result Value Ref Range Status   Specimen Description   Final    BLOOD RIGHT FOREARM Performed at [facility], [address] [place], [place] [address]    Special Requests   Final    BOTTLES DRAWN AEROBIC AND ANAEROBIC Blood Culture results may not be optimal due to an inadequate volume of blood received in culture bottles Performed at [facility], [address] [place], [place] [address]    Culture   Final    NO GROWTH 4 DAYS Performed at [facility], [place] [address], [place], [place] [address]    Report Status PENDING  Incomplete  Resp Panel by RT-PCR (Flu A&B, Covid) Nasopharyngeal Swab     Status: None   Collection Time: 12/16/20  5:20 AM   Specimen: Nasopharyngeal Swab; Nasopharyngeal(NP) swabs in vial transport medium  Result Value Ref Range Status   SARS Coronavirus 2 by RT PCR NEGATIVE NEGATIVE Final    Comment: (NOTE) SARS-CoV-2 target nucleic acids are NOT DETECTED.  The SARS-CoV-2 RNA is generally detectable in upper respiratory specimens during the acute phase of infection. The lowest concentration of SARS-CoV-2 viral copies this assay can detect is 138 copies/mL. A negative result does not preclude SARS-Cov-2 infection and should not be used as the sole basis for treatment or other patient management decisions. A negative result  may occur with  improper specimen collection/handling, submission of specimen other than nasopharyngeal swab, presence of viral mutation(s) within the areas targeted by this assay, and inadequate  number of viral copies(<138 copies/mL). A negative result must be combined with clinical observations, patient history, and epidemiological information. The expected result is Negative.  Fact Sheet for Patients:  EntrepreneurPulse.com.au  Fact Sheet for Healthcare Providers:  IncredibleEmployment.be  This test is no t yet approved or cleared by the [place] FDA and  has been authorized for detection and/or diagnosis of SARS-CoV-2 by FDA under an Emergency Use Authorization (EUA). This EUA will remain  in effect (meaning this test can be used) for the duration of the COVID-19 declaration under Section 564(b)(1) of the Act, 21 U.S.C.section 360bbb-3(b)(1), unless the authorization is terminated  or revoked sooner.       Influenza A by PCR NEGATIVE NEGATIVE Final   Influenza B by PCR NEGATIVE NEGATIVE Final    Comment: (NOTE) The Xpert Xpress SARS-CoV-2/FLU/RSV plus assay is intended as an aid in the diagnosis of influenza from Nasopharyngeal swab specimens and should not be used as a sole basis for treatment. Nasal washings and aspirates are unacceptable for Xpert Xpress SARS-CoV-2/FLU/RSV testing.  Fact Sheet for Patients: EntrepreneurPulse.com.au  Fact Sheet for Healthcare Providers: IncredibleEmployment.be  This test is not yet approved or cleared by the [place] FDA and has been authorized for detection and/or diagnosis of SARS-CoV-2 by FDA under an Emergency Use Authorization (EUA). This EUA will remain in effect (meaning this test can be used) for the duration of the COVID-19 declaration under Section 564(b)(1) of the Act, 21 U.S.C. section 360bbb-3(b)(1), unless the authorization is terminated or revoked.  Performed at [facility], [address], [place], [place] [address]   Surgical pcr screen     Status: None   Collection Time: 12/17/20  1:16 PM   Specimen: Nasal  Mucosa; Nasal Swab  Result Value Ref Range Status   MRSA, PCR NEGATIVE NEGATIVE Final   Staphylococcus aureus NEGATIVE NEGATIVE Final    Comment: (NOTE) The Xpert SA Assay (FDA approved for NASAL specimens in patients 17 years of age and older), is one component of a comprehensive surveillance program. It is not intended to diagnose infection nor to guide or monitor treatment. Performed at [facility], [place] [address], [place], [place] [address]   Aerobic Culture w Gram Stain (superficial specimen)     Status: None   Collection Time: 12/18/20  4:12 PM   Specimen: Tissue  Result Value Ref Range Status   Specimen Description   Final    TISSUE Performed at [place] [address], [place], [place] [address]    Special Requests   Final    NONE Performed at [facility], [place] [address], [place], [place] [address]    Gram Stain   Final    ABUNDANT WBC PRESENT, PREDOMINANTLY MONONUCLEAR ABUNDANT GRAM POSITIVE COCCI FEW GRAM NEGATIVE RODS FEW GRAM POSITIVE RODS    Culture   Final    ABUNDANT STREPTOCOCCUS AGALACTIAE TESTING AGAINST S. AGALACTIAE NOT ROUTINELY PERFORMED DUE TO PREDICTABILITY OF AMP/PEN/VAN SUSCEPTIBILITY. Performed at [facility], [place] [address], [place], [place] [address]    Report Status 12/20/2020 FINAL  Final      Pertinent Lab seen by me: CBC Latest Ref Rng & Units 12/20/2020 12/19/2020 12/18/2020  WBC 4.0 - 10.5 K/uL 17.3(H) 19.8(H) 16.1(H)  Hemoglobin 12.0 - 15.0 g/dL 10.8(L)  10.8(L) 12.3  Hematocrit 36.0 - 46.0 % 34.8(L) 34.3(L) 38.5  Platelets 150 - 400 K/uL 305 324 337   CMP Latest Ref Rng & Units 12/20/2020 12/19/2020 12/17/2020  Glucose 70 - 99 mg/dL 369(H) 362(H) 231(H)  BUN 6 - 20 mg/dL 16 14 17   Creatinine 0.44 - 1.00 mg/dL 0.70 0.81 0.84  Sodium 135 - 145 mmol/L 128(L) 129(L) 129(L)  Potassium 3.5 - 5.1 mmol/L 4.2 4.2 3.8  Chloride 98 - 111 mmol/L 92(L) 96(L) 97(L)  CO2 22 - 32  mmol/L 25 22 22   Calcium 8.9 - 10.3 mg/dL 8.4(L) 8.2(L) 8.2(L)  Total Protein 6.5 - 8.1 g/dL 7.7 7.3 7.2  Total Bilirubin 0.3 - 1.2 mg/dL 0.7 0.9 0.9  Alkaline Phos 38 - 126 U/L 94 84 86  AST 15 - 41 U/L 22 22 18   ALT 0 - 44 U/L 12 12 10      Pertinent Imagings/Other Imagings Plain films and CT images have been personally visualized and interpreted; radiology reports have been reviewed. Decision making incorporated into the Impression / Recommendations.  MR FOOT RIGHT W WO CONTRAST  Result Date: 12/17/2020 CLINICAL DATA:  Osteomyelitis of the foot EXAM: MRI OF THE RIGHT FOREFOOT WITHOUT AND WITH CONTRAST TECHNIQUE: Multiplanar, multisequence MR imaging of the right forefoot was performed before and after the administration of intravenous contrast. CONTRAST:  10 cc Gadavist COMPARISON:  Radiographs 12/16/2020 FINDINGS: Bones/Joint/Cartilage Abnormal marrow edema compatible with osteomyelitis involving the phalanges of the second toe and the second metatarsal, extending in the metatarsal back to the proximal metaphysis. There is probably a small erosion along the lateral margin of the proximal metaphysis of the second metatarsal. Degenerative findings along the Lisfranc joint. No other compelling regions for osteomyelitis in the forefoot. Ligaments Lisfranc ligament unremarkable. Muscles and Tendons Abscess and myositis tracking in the flexor digitorum brevis muscle as shown previously. Low-grade edema and enhancement tracking along the interosseous muscles and the abductor hallucis, potentially reflecting mild myositis. Soft tissues There is gas in the soft tissues of the second toe with extensive edema and hypoenhancement compatible with necrotic tissue and soft tissue infection which tracks back along the base of the second toe and to a lesser extent in the dorsum of the foot adjacent to the second toe. There is also abnormal hypoenhancement of tissues along the plantar subcutaneous foot back from the  second toe and extending along and proximally 11 cm excursion, necrotic tissue and soft tissue infection in this vicinity is not excluded and there is an associated subcutaneous fluid collection as shown on image 21 of series 7, with ill-defined margins, but measuring about 8 cc. Extensive subcutaneous edema circumferentially in the forefoot compatible with cellulitis. This extends into all of the toes. IMPRESSION: 1. Osteomyelitis of the phalanges and metatarsal of the second ray, with soft tissue infection in the second toe with locules of gas and hypoenhancement favoring necrotic tissue. Some of this extends back to the ball of the foot in the second toe, along with abnormal subcutaneous collection potentially reflecting abscess measuring about 8 cc in volume along the plantar foot. 2. Abscess in the dorsal flexor hallucis brevis muscle as noted previously, with questionable connectivity to the plantar subcutaneous fluid and debris collection. Regional myositis noted along with extensive cellulitis of the forefoot and toes. Electronically Signed   By: Van Clines M.D.   On: 12/17/2020 13:07   MR ANKLE RIGHT W WO CONTRAST  Result Date: 12/17/2020 CLINICAL DATA:  Diabetes, foot infection, preoperative planning. EXAM:  MRI OF THE RIGHT ANKLE WITHOUT AND WITH CONTRAST TECHNIQUE: Multiplanar, multisequence MR imaging of the ankle was performed before and after the administration of intravenous contrast. CONTRAST:  11m GADAVIST GADOBUTROL 1 MMOL/ML IV SOLN COMPARISON:  Radiographs 12/16/2020 FINDINGS: TENDONS Peroneal: Unremarkable Posteromedial: Moderate distal tibialis posterior tendinopathy. Anterior: Unremarkable Achilles: Unremarkable Plantar Fascia: No substantial thickening of the plantar fascia. LIGAMENTS Lateral: Unremarkable Medial: Unremarkable CARTILAGE Ankle Joint: 3 mm non-fragmented osteochondral lesion of the medial talar dome, image 21 series 4. Subtalar Joints/Sinus Tarsi: Unremarkable  Bones: Marrow edema in the shaft of the second metatarsal compatible osteomyelitis based on the foot MRI appearance. Other: 1.1 by 0.7 by 6.4 cm (volume = 3 cm^3) fluid signal intensity collection with enhancing margins tracking within along the dorsal portion of the flexor digitorum brevis muscle as shown on image 29 series 10. Appearance compatible with abscess. There is abnormal edema and enhancement in the flexor digitorum brevis muscle favoring myositis, and low-level edema tracking in the abductor hallucis muscle potentially also from myositis. Dorsal subcutaneous edema and enhancement along the ankle favoring cellulitis. IMPRESSION: IMPRESSION 1. 3 cc abscess tracking within the dorsal portion of the flexor digitorum brevis muscle. Myositis involving the flexor digitorum brevis and abductor hallucis muscles. 2. Marrow edema in the shaft of the second metatarsal compatible with osteomyelitis based on the foot MRI appearance. 3. 3 mm non-fragmented osteochondral lesion of the medial talar dome. 4. Moderate distal tibialis posterior tendinopathy. 5. Dorsal cellulitis along the ankle. Electronically Signed   By: WVan ClinesM.D.   On: 12/17/2020 13:00   DG Foot Complete Right  Result Date: 12/16/2020 CLINICAL DATA:  40year old female with right foot infection, fever. Diabetes. EXAM: RIGHT FOOT COMPLETE - 3+ VIEW COMPARISON:  None. FINDINGS: Soft tissue swelling throughout the distal foot, maximal at the 2nd toe with gaping soft tissue ulceration or less likely soft tissue gas. Destruction of the 2nd distal phalanx, and the distal half of the middle phalanx. Destroyed 2nd D IP. Bone mineralization elsewhere in the right foot maintained. Extensive dystrophic calcifications in the soft tissues of the distal leg. IMPRESSION: Positive for Osteomyelitis of the 2nd toe distal AND middle phalanges. Distal phalanx almost completely eroded. Electronically Signed   By: HGenevie AnnM.D.   On: 12/16/2020 04:17   VAS  [place]I WITH/WO TBI  Result Date: 12/17/2020  LOWER EXTREMITY DOPPLER STUDY Patient Name:  MASMI FUGERE Date of Exam:   12/17/2020 Medical Rec #: [id number]      Accession #:    [id number] of Birth: [birth year]      Patient Gender: F Patient Age:   89years [facility]      [facility] [place] WITH/WO TBI Referring Phys: DArmond Hang--------------------------------------------------------------------------------  Indications: Ulceration, and osteomyelitis of the second toe distal and middle              phalanges. Distal phalanx is almost completely eroded. High Risk Factors: Diabetes. Other Factors: Not on medical therapy.  Limitations: Today's exam was limited due to bandages and tachycardia. Comparison Study: No prior study Performing Technologist: HDarlin Coco Examination Guidelines: A complete evaluation includes at minimum, Doppler waveform signals and systolic blood pressure reading at the level of bilateral brachial, anterior tibial, and posterior tibial arteries, when vessel segments are accessible. Bilateral testing is considered an integral part of a complete examination. Photoelectric Plethysmograph (PPG) waveforms and toe systolic pressure readings are included as required and  additional duplex testing as needed. Limited examinations for reoccurring indications may be performed as noted.  ABI Findings: +---------+------------------+-----+-----------+-------------------+ Right    Rt Pressure (mmHg)IndexWaveform   Comment             +---------+------------------+-----+-----------+-------------------+ Brachial 137                    multiphasic                    +---------+------------------+-----+-----------+-------------------+ PTA      156               1.11 multiphasic                    +---------+------------------+-----+-----------+-------------------+ DP       152               1.08 multiphasic                     +---------+------------------+-----+-----------+-------------------+ Great Toe                                  ulceration/bandages +---------+------------------+-----+-----------+-------------------+ +---------+------------------+-----+-----------+-------+ Left     Lt Pressure (mmHg)IndexWaveform   Comment +---------+------------------+-----+-----------+-------+ Brachial 141                    multiphasic        +---------+------------------+-----+-----------+-------+ PTA      155               1.10 multiphasic        +---------+------------------+-----+-----------+-------+ DP       159               1.13 multiphasic        +---------+------------------+-----+-----------+-------+ Great Toe126               0.89                    +---------+------------------+-----+-----------+-------+  Summary: Right: Resting right ankle-brachial index is within normal range. No evidence of significant right lower extremity arterial disease. Unable to attain ABI secondary to ulceration and bandages. Left: Resting left ankle-brachial index is within normal range. No evidence of significant left lower extremity arterial disease. The left toe-brachial index is normal.  *See table(s) above for measurements and observations.  Electronically signed by Deitra Mayo MD on 12/17/2020 at 4:25:18 PM.    Final     I spent more than 70 minutes for this patient encounter including review of prior medical records, coordination of care  with greater than 50% of time being face to face/counseling and discussing diagnostics/treatment plan with the patient/family.  Electronically signed by:   Rosiland Oz, MD Infectious Disease Physician [facility] for Infectious Disease Pager: [phone]  Electronically signed by:   Rosiland Oz, MD Infectious Disease Physician [facility] for Infectious Disease Pager: [phone]

## 2020-12-20 NOTE — Progress Notes (Signed)
OT Cancellation Note  Patient Details Name: [name] MRN: [id number] DOB: 1980-08-06   Cancelled Treatment:    Reason Eval/Treat Not Completed: Other (comment) Patient reported having just settled after pain being high. Patient asked for therapy to come back tomorrow. OT will continue to follow and check back as schedule will allow.  Sharyn Blitz OTR/L, MS Acute Rehabilitation Department Office# [phone] Pager# [phone]  12/20/2020, 3:35 PM

## 2020-12-20 NOTE — Progress Notes (Signed)
Subjective: 2 Days Post-Op Procedure(s) (LRB): TRANSMETATARSAL AMPUTATION (Right)  Patient reports pain as better controlled now.    Objective:   VITALS:  Temp:  [98.4 F (36.9 C)-99.5 F (37.5 C)] 98.6 F (37 C) (12/07 1312) Pulse Rate:  [94-108] 99 (12/07 1312) Resp:  [14-20] 14 (12/07 1312) BP: (141-153)/(83-94) 141/94 (12/07 1312) SpO2:  [97 %-100 %] 100 % (12/07 1312)  Physical Exam  Gen: AAOx3, NAD   Right lower extremity: S/p TMA Incision c/d/I, mild serous drainage SILT over foot DP, PT 2+ to palpation CR<2s   LABS Recent Labs    12/18/20 0434 12/19/20 0445 12/20/20 0434  HGB 12.3 10.8* 10.8*  WBC 16.1* 19.8* 17.3*  PLT 337 324 305   Recent Labs    12/19/20 0445 12/20/20 0434  NA 129* 128*  K 4.2 4.2  CL 96* 92*  CO2 22 25  BUN 14 16  CREATININE 0.81 0.70  GLUCOSE 362* 369*   No results for input(s): LABPT, INR in the last 72 hours.   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 Recommend a full course of antibiotic therapy given extent of infection and poor host factors placing her at high risk to limb/life -strict NWB operative extremity until sutures out, maximum elevation -dry dressing changes every other day until follow up -pain meds and VTE ppx per primary team -physical therapy to assist with NWB gait training RLE -nutrition referral and treatment of low albumin/prealbumin along with dietary changes for diabetes control -optimize glycemic control & counseling -discharge home per primary team -sutures out in 4 weeks in outpatient office, return to clinic 2 weeks postop for wound check  Netta Cedars 12/20/2020, 1:50  PM

## 2020-12-21 DIAGNOSIS — E1165 Type 2 diabetes mellitus with hyperglycemia: Secondary | ICD-10-CM | POA: Diagnosis not present

## 2020-12-21 DIAGNOSIS — E785 Hyperlipidemia, unspecified: Secondary | ICD-10-CM | POA: Diagnosis not present

## 2020-12-21 DIAGNOSIS — M869 Osteomyelitis, unspecified: Secondary | ICD-10-CM | POA: Diagnosis not present

## 2020-12-21 LAB — BASIC METABOLIC PANEL
Anion gap: 11 (ref 5–15)
BUN: 14 mg/dL (ref 6–20)
CO2: 25 mmol/L (ref 22–32)
Calcium: 8.5 mg/dL — ABNORMAL LOW (ref 8.9–10.3)
Chloride: 93 mmol/L — ABNORMAL LOW (ref 98–111)
Creatinine, Ser: 0.73 mg/dL (ref 0.44–1.00)
GFR, Estimated: >60 mL/min (ref >60)
Glucose, Bld: 332 mg/dL — ABNORMAL HIGH (ref 70–99)
Potassium: 4.7 mmol/L (ref 3.5–5.1)
Sodium: 129 mmol/L — ABNORMAL LOW (ref 135–145)

## 2020-12-21 LAB — CULTURE, BLOOD (ROUTINE X 2)
Culture: NO GROWTH
Culture: NO GROWTH

## 2020-12-21 LAB — CBC WITH DIFFERENTIAL/PLATELET
Abs Immature Granulocytes: 0.31 10*3/uL — ABNORMAL HIGH (ref 0.00–0.07)
Basophils Absolute: 0.1 10*3/uL (ref 0.0–0.1)
Basophils Relative: 1 %
Eosinophils Absolute: 0.1 10*3/uL (ref 0.0–0.5)
Eosinophils Relative: 1 %
HCT: 37.4 % (ref 36.0–46.0)
Hemoglobin: 11.4 g/dL — ABNORMAL LOW (ref 12.0–15.0)
Immature Granulocytes: 2 %
Lymphocytes Relative: 12 %
Lymphs Abs: 1.9 10*3/uL (ref 0.7–4.0)
MCH: 26.7 pg (ref 26.0–34.0)
MCHC: 30.5 g/dL (ref 30.0–36.0)
MCV: 87.6 fL (ref 80.0–100.0)
Monocytes Absolute: 1.2 10*3/uL — ABNORMAL HIGH (ref 0.1–1.0)
Monocytes Relative: 8 %
Neutro Abs: 11.8 10*3/uL — ABNORMAL HIGH (ref 1.7–7.7)
Neutrophils Relative %: 76 %
Platelets: 328 10*3/uL (ref 150–400)
RBC: 4.27 MIL/uL (ref 3.87–5.11)
RDW: 14.7 % (ref 11.5–15.5)
WBC: 15.5 10*3/uL — ABNORMAL HIGH (ref 4.0–10.5)
nRBC: 0 % (ref 0.0–0.2)

## 2020-12-21 LAB — ACID FAST SMEAR (AFB, MYCOBACTERIA): Acid Fast Smear: NEGATIVE

## 2020-12-21 LAB — GLUCOSE, CAPILLARY
Glucose-Capillary: 304 mg/dL — ABNORMAL HIGH (ref 70–99)
Glucose-Capillary: 318 mg/dL — ABNORMAL HIGH (ref 70–99)
Glucose-Capillary: 295 mg/dL — ABNORMAL HIGH (ref 70–99)
Glucose-Capillary: 278 mg/dL — ABNORMAL HIGH (ref 70–99)

## 2020-12-21 LAB — MAGNESIUM: Magnesium: 2 mg/dL (ref 1.7–2.4)

## 2020-12-21 MED ORDER — METRONIDAZOLE 500 MG PO TABS: 500.0000 mg | ORAL_TABLET | Freq: Two times a day (BID) | ORAL | 0 refills | Status: AC

## 2020-12-21 MED ORDER — SODIUM CHLORIDE 0.9% FLUSH: 10.0000 mL | Freq: Two times a day (BID) | INTRAVENOUS | Status: DC

## 2020-12-21 MED ORDER — SODIUM CHLORIDE 0.9% FLUSH: 10.0000 mL | INTRAVENOUS | Status: DC | PRN

## 2020-12-21 MED ORDER — CEFTRIAXONE IV (FOR PTA / DISCHARGE USE ONLY): 2.0000 g | INTRAVENOUS | 0 refills | Status: AC

## 2020-12-21 MED ORDER — SODIUM CHLORIDE 0.9 % IV SOLN
2.0000 g | INTRAVENOUS | Status: DC
  Administered 2020-12-22 – 2020-12-23 (×2): 2 g via INTRAVENOUS
  Filled 2020-12-21 (×2): qty 20

## 2020-12-21 MED ORDER — INSULIN ASPART PROT & ASPART (70-30 MIX) 100 UNIT/ML ~~LOC~~ SUSP
25.0000 [IU] | Freq: Two times a day (BID) | SUBCUTANEOUS | Status: DC
  Administered 2020-12-21: 25 [IU] via SUBCUTANEOUS
  Filled 2020-12-21: qty 10

## 2020-12-21 MED ORDER — CHLORHEXIDINE GLUCONATE CLOTH 2 % EX PADS
6.0000 | MEDICATED_PAD | Freq: Every day | CUTANEOUS | Status: DC
  Administered 2020-12-21 – 2020-12-23 (×3): 6 via TOPICAL

## 2020-12-21 MED ORDER — AMLODIPINE BESYLATE 5 MG PO TABS
5.0000 mg | ORAL_TABLET | Freq: Every day | ORAL | Status: DC
  Administered 2020-12-21 – 2020-12-23 (×3): 5 mg via ORAL
  Filled 2020-12-21 (×3): qty 1

## 2020-12-21 MED ORDER — INSULIN ASPART PROT & ASPART (70-30 MIX) 100 UNIT/ML ~~LOC~~ SUSP
28.0000 [IU] | Freq: Two times a day (BID) | SUBCUTANEOUS | Status: DC
  Administered 2020-12-21 – 2020-12-22 (×2): 28 [IU] via SUBCUTANEOUS

## 2020-12-21 NOTE — TOC Progression Note (Signed)
Transition of Care [facility]) - Progression Note   Patient Details  Name: [name] MRN: [id number] Date of Birth: [birth year]  Transition of Care [facility]) CM/SW Contact  Ewing Schlein, LCSW Phone Number: 12/21/2020, 3:11 PM  Clinical Narrative: CSW made charity [facility] referral to [facility] with [employer], but patient was declined due to staffing. CSW also asked [facility] to review patient for charity HHPT/OT, but patient was declined for charity PT and OT as well. Pam with [facility] made [facility] referral to [facility], but the agency can only accept pending an LOG being approved. LOG paperwork completed and submitted for review/approval.  PT and OT evaluations recommended bariatric rolling walker and 3N1 as well as a wheelchair. CSW made DME referral to [facility] with [employer] to screen the patient for charity equipment. Patient has been approved for charity DME. [employer] will deliver DME to patient's home tomorrow pending DME orders.  OPPT referral made to a [facility] rehab clinic and is pending hospitalist cosign. TOC to follow.  Expected Discharge Plan: Home w Home Health Services Barriers to Discharge: Continued Medical Work up, Inadequate or no insurance  Expected Discharge Plan and Services Expected Discharge Plan: Home w Home Health Services In-house Referral: Clinical Social Work Post Acute Care Choice: Home Health Living arrangements for the past 2 months: Single Family Home              DME Arranged: N/A DME Agency: NA HH Arranged: IV Antibiotics, RN HH Agency: [facility] Date HH Agency Contacted: 12/20/20 Representative spoke with at [facility] Agency: Pam  Readmission Risk Interventions No flowsheet data found.

## 2020-12-21 NOTE — Progress Notes (Signed)
Loss of iv access. Awaiting PICC line.

## 2020-12-21 NOTE — Progress Notes (Addendum)
PROGRESS NOTE    JURLENE GILCHREST  [id number] DOB: [birth year] DOA: 12/16/2020 PCP: Pcp, No    Chief Complaint  Patient presents with   Foot Pain    Brief Narrative:  40 y.o. female with medical history significant of class III obesity, hyperlipidemia, type 2 diabetes currently not on medical therapy who is coming from [facility] emergency department where she presented with that week history of worsening chronic toe ulcer, which she has had for the past year plus, now coming with increased pain, development of purulent discharge and discoloration of second toe.  She does not remember having any trauma to the area.  No fever, chills, but feels fatigue.  No rhinorrhea, sore throat, wheezing, dyspnea or hemoptysis.  No chest pain, palpitations, diaphoresis, PND, orthopnea or pitting edema of the lower extremities.  She has some nausea and vomiting earlier today.  No abdominal pain, diarrhea, constipation, melena or hematochezia.  No dysuria, frequency or hematuria.  She does not check her blood glucose regularly and has not been under treatment recently.  She denied polyuria, polydipsia, polyphagia or blurred vision.  She does not have a PCP currently. Pt was admitted for concerns of osteomyelitis of R foot. 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 ID consulted for antibiotic recommendations and duration     Assessment & Plan:   Principal Problem:   Osteomyelitis of second toe of right foot ([place]) Active Problems:   Type 2 diabetes mellitus with hyperglycemia (HCC)   Class 3 obesity (HCC)   Hyperlipidemia   Osteomyelitis of right foot (HCC)  #1 osteomyelitis of second toe of the right foot -MRI on admission with findings concerning for osteomyelitis of the phalanges and metatarsal of the second ray with soft tissue infection.  Abscess in dorsal flexor hallucis brevis and regional myositis and  extensive cellulitis of forefoot and toes. -Patient seen in consultation by orthopedics, underwent ABIs. -Patient is status post right foot transmetatarsal amputation and I&D of deep abscess. -Cultures performed perioperatively with abundant Streptococcus agalactiae. -Patient being followed by orthopedics who recommended nonweightbearing to right lower extremity, extended course of IV antibiotics given extent of infection and poorly controlled diabetes, ID recommendations per orthopedics, dressing changes. -ID consulted and recommended de-escalation of IV antibiotics to Rocephin and plan for 6 weeks of treatment from date of amputation. -ID also recommending 2 weeks of oral Flagyl. -ID recommending PICC line placement with outpatient follow-up with ID in the outpatient setting. -PICC line ordered and pending. -Outpatient follow-up with orthopedics. -PT/OT evaluated patient and recommending home health therapies as well as wheelchair, 3 and 1, rolling walker.    2.  Poorly controlled diabetes mellitus type 2 -Hemoglobin A1c 13.5 (12/16/2020) -CBG noted at 304 this morning -Patient with no insurance, difficulty getting medications and as such patient started on 70/30  20 units twice daily.   -Increase 70/30 to 28 units twice daily.   -Continue SSI, Neurontin.   -Diabetes coordinator following.   3.  Obesity -Lifestyle modification. -Outpatient follow-up with PCP.  4.  Hyperlipidemia -Fasting lipid panel with LDL of 58. -Outpatient follow-up.  5.  Hyponatremia -Urinalysis pending.  Urine electrolytes pending.  Urine osmolality pending.  -Slowly improving with IV fluids.  -Repeat labs in the a.m.  6.  Hypertension/elevated blood pressure -Start Norvasc 5 mg daily.   DVT prophylaxis: SCDs Code Status: Full Family Communication: Updated patient.  No family at bedside. Disposition:   Status is: Inpatient  Remains inpatient appropriate because: Severity of illness        Consultants:  ID: Dr. West Bali 12/20/2020 Orthopedics: Dr.Ramanathan 12/17/2020  Procedures:  Plain films of the right foot 12/16/2020 Plain films of the right foot 12/17/2020 ABI 12/17/2020 Right foot transmetatarsal amputation/right foot incision and drainage of deep abscess per Dr. Kathaleen Bury, orthopedics 12/18/2020  Antimicrobials:  Oral Flagyl 12/20/2020>>> IV cefepime 12/16/2020>>>>> 12/20/2020 IV Rocephin 12/20/2020>>>> IV vancomycin 12/16/2020>>>> 12/20/2020   Subjective: Sitting up in bed.  States lost IV.  No chest pain.  No shortness of breath.  Still with pain in the right foot.  Tolerating current diet.  No abdominal pain.  Mother at bedside.    Objective: Vitals:   12/19/20 2127 12/20/20 0548 12/20/20 1312 12/20/20 2101  BP: (!) 153/89 (!) 152/83 (!) 141/94 (!) 148/68  Pulse: (!) 108 94 99 (!) 106  Resp: 20 18 14 18   Temp: 98.4 F (36.9 C) 99.5 F (37.5 C) 98.6 F (37 C) 99.7 F (37.6 C)  TempSrc: Oral Oral Oral Oral  SpO2: 100% 97% 100% 99%  Weight:      Height:        Intake/Output Summary (Last 24 hours) at 12/21/2020 1337 Last data filed at 12/21/2020 0900 Gross per 24 hour  Intake 3709.46 ml  Output 3500 ml  Net 209.46 ml    Filed Weights   12/16/20 1358 12/18/20 1211  Weight: (!) 138.9 kg (!) 138.9 kg    Examination:  General exam: NAD. Respiratory system: Lungs clear to auscultation bilaterally.  No wheezes, no crackles, no rhonchi.  Normal respiratory effort.  Cardiovascular system: Regular rate and rhythm no murmurs rubs or gallops.  No JVD.  No lower extremity edema.  Gastrointestinal system: Abdomen is soft, nontender, nondistended, positive bowel sounds.  No rebound.  No guarding.  Central nervous system: Alert and oriented. No focal neurological deficits. Extremities: Status post right transmetatarsal amputation with right foot in bandage.  Skin: No rashes, lesions or ulcers Psychiatry: Judgement and insight appear normal. Mood & affect  appropriate.     Data Reviewed: I have personally reviewed following labs and imaging studies  CBC: Recent Labs  Lab 12/16/20 0345 12/17/20 0425 12/18/20 0434 12/19/20 0445 12/20/20 0434 12/21/20 0444  WBC 19.9* 18.1* 16.1* 19.8* 17.3* 15.5*  NEUTROABS 16.3*  --   --   --   --  11.8*  HGB 12.7 11.5* 12.3 10.8* 10.8* 11.4*  HCT 39.0 35.0* 38.5 34.3* 34.8* 37.4  MCV 82.3 83.7 85.0 84.5 85.9 87.6  PLT 444* 351 337 324 305 328     Basic Metabolic Panel: Recent Labs  Lab 12/16/20 0345 12/17/20 0425 12/19/20 0445 12/20/20 0434 12/21/20 0444  NA 128* 129* 129* 128* 129*  K 4.3 3.8 4.2 4.2 4.7  CL 87* 97* 96* 92* 93*  CO2 22 22 22 25 25   GLUCOSE 393* 231* 362* 369* 332*  BUN 15 17 14 16 14   CREATININE 0.87 0.84 0.81 0.70 0.73  CALCIUM 8.9 8.2* 8.2* 8.4* 8.5*  MG  --   --   --   --  2.0     GFR: Estimated Creatinine Clearance: 142 mL/min (by C-G formula based on SCr of 0.73 mg/dL).  Liver Function Tests: Recent Labs  Lab 12/16/20 0345 12/17/20 0425 12/19/20 0445 12/20/20 0434  AST 13* 18 22 22   ALT 9 10 12 12   ALKPHOS 112 86 84 94  BILITOT 0.9 0.9 0.9 0.7  PROT 8.6* 7.2 7.3 7.7  ALBUMIN 2.6*  2.3* 2.1* 2.3*     CBG: Recent Labs  Lab 12/20/20 0740 12/20/20 1213 12/20/20 1714 12/20/20 2103 12/21/20 0737  GLUCAP 307* 228* 300* 218* 304*      Recent Results (from the past 240 hour(s))  Blood culture (routine x 2)     Status: None   Collection Time: 12/16/20  4:15 AM   Specimen: BLOOD  Result Value Ref Range Status   Specimen Description   Final    BLOOD RIGHT ANTECUBITAL Performed at [facility], [address] [place], [place] [address]    Special Requests   Final    BOTTLES DRAWN AEROBIC AND ANAEROBIC Blood Culture results may not be optimal due to an inadequate volume of blood received in culture bottles Performed at [facility], [address] [place], [place] [address]    Culture   Final    NO GROWTH 5  DAYS Performed at [facility], [place] [address] [place], [place] [address]    Report Status 12/21/2020 FINAL  Final  Blood culture (routine x 2)     Status: None   Collection Time: 12/16/20  4:20 AM   Specimen: BLOOD  Result Value Ref Range Status   Specimen Description   Final    BLOOD RIGHT FOREARM Performed at [facility], [address] [place], [place] [address]    Special Requests   Final    BOTTLES DRAWN AEROBIC AND ANAEROBIC Blood Culture results may not be optimal due to an inadequate volume of blood received in culture bottles Performed at [facility], [address] [place], [place] [address]    Culture   Final    NO GROWTH 5 DAYS Performed at [facility], [place] [address], [place], [place] [address]    Report Status 12/21/2020 FINAL  Final  Resp Panel by RT-PCR (Flu A&B, Covid) Nasopharyngeal Swab     Status: None   Collection Time: 12/16/20  5:20 AM   Specimen: Nasopharyngeal Swab; Nasopharyngeal(NP) swabs in vial transport medium  Result Value Ref Range Status   SARS Coronavirus 2 by RT PCR NEGATIVE NEGATIVE Final    Comment: (NOTE) SARS-CoV-2 target nucleic acids are NOT DETECTED.  The SARS-CoV-2 RNA is generally detectable in upper respiratory specimens during the acute phase of infection. The lowest concentration of SARS-CoV-2 viral copies this assay can detect is 138 copies/mL. A negative result does not preclude SARS-Cov-2 infection and should not be used as the sole basis for treatment or other patient management decisions. A negative result may occur with  improper specimen collection/handling, submission of specimen other than nasopharyngeal swab, presence of viral mutation(s) within the areas targeted by this assay, and inadequate number of viral copies(<138 copies/mL). A negative result must be combined with clinical observations, patient history, and epidemiological information. The expected result is  Negative.  Fact Sheet for Patients:  EntrepreneurPulse.com.au  Fact Sheet for Healthcare Providers:  IncredibleEmployment.be  This test is no t yet approved or cleared by the [place] FDA and  has been authorized for detection and/or diagnosis of SARS-CoV-2 by FDA under an Emergency Use Authorization (EUA). This EUA will remain  in effect (meaning this test can be used) for the duration of the COVID-19 declaration under Section 564(b)(1) of the Act, 21 U.S.C.section 360bbb-3(b)(1), unless the authorization is terminated  or revoked sooner.       Influenza A by PCR NEGATIVE NEGATIVE Final  Influenza B by PCR NEGATIVE NEGATIVE Final    Comment: (NOTE) The Xpert Xpress SARS-CoV-2/FLU/RSV plus assay is intended as an aid in the diagnosis of influenza from Nasopharyngeal swab specimens and should not be used as a sole basis for treatment. Nasal washings and aspirates are unacceptable for Xpert Xpress SARS-CoV-2/FLU/RSV testing.  Fact Sheet for Patients: EntrepreneurPulse.com.au  Fact Sheet for Healthcare Providers: IncredibleEmployment.be  This test is not yet approved or cleared by the [place] FDA and has been authorized for detection and/or diagnosis of SARS-CoV-2 by FDA under an Emergency Use Authorization (EUA). This EUA will remain in effect (meaning this test can be used) for the duration of the COVID-19 declaration under Section 564(b)(1) of the Act, 21 U.S.C. section 360bbb-3(b)(1), unless the authorization is terminated or revoked.  Performed at [facility], [address] [place], [place] [address]   Surgical pcr screen     Status: None   Collection Time: 12/17/20  1:16 PM   Specimen: Nasal Mucosa; Nasal Swab  Result Value Ref Range Status   MRSA, PCR NEGATIVE NEGATIVE Final   Staphylococcus aureus NEGATIVE NEGATIVE Final    Comment: (NOTE) The Xpert SA Assay (FDA  approved for NASAL specimens in patients 60 years of age and older), is one component of a comprehensive surveillance program. It is not intended to diagnose infection nor to guide or monitor treatment. Performed at [facility], [place] [address], [place], [place] [address]   Aerobic Culture w Gram Stain (superficial specimen)     Status: None   Collection Time: 12/18/20  4:12 PM   Specimen: Tissue  Result Value Ref Range Status   Specimen Description   Final    TISSUE Performed at [facility] [address] [place], [place] [address]    Special Requests   Final    NONE Performed at [facility], [place] [address], [place], [place] [address]    Gram Stain   Final    ABUNDANT WBC PRESENT, PREDOMINANTLY MONONUCLEAR ABUNDANT GRAM POSITIVE COCCI FEW GRAM NEGATIVE RODS FEW GRAM POSITIVE RODS    Culture   Final    ABUNDANT STREPTOCOCCUS AGALACTIAE TESTING AGAINST S. AGALACTIAE NOT ROUTINELY PERFORMED DUE TO PREDICTABILITY OF AMP/PEN/VAN SUSCEPTIBILITY. Performed at [facility], [place] [address], [place],  [address]    Report Status 12/20/2020 FINAL  Final          Radiology Studies: [place] EKG SITE RITE  Result Date: 12/20/2020 If Site Rite image not attached, placement could not be confirmed due to current cardiac rhythm.       Scheduled Meds:  feeding supplement (GLUCERNA SHAKE)  237 mL Oral TID BM   gabapentin  300 mg Oral TID   insulin aspart  0-20 Units Subcutaneous TID WC   insulin aspart  0-5 Units Subcutaneous QHS   insulin aspart protamine- aspart  25 Units Subcutaneous BID WC   metroNIDAZOLE  500 mg Oral Q12H   multivitamin with minerals  1 tablet Oral Daily   nutrition supplement (JUVEN)  1 packet Oral BID WC   Ensure Max Protein  11 oz Oral BID   Continuous Infusions:  sodium chloride 125 mL/hr at 12/21/20 0334   cefTRIAXone (ROCEPHIN)  IV 2 g (12/20/20 1348)   methocarbamol (ROBAXIN) IV  500 mg (12/20/20 1505)     LOS: 5 days    Time spent: 40 minutes    Irine Seal, MD [facility]   To  contact the attending provider between 7A-7P or the covering provider during after hours 7P-7A, please log into the web site [email or web address] and access using universal [place] password for that web site. If you do not have the password, please call the hospital operator.  12/21/2020, 1:37 PM

## 2020-12-21 NOTE — Evaluation (Addendum)
Occupational Therapy Evaluation Patient Details Name: [name] MRN: [id number] DOB: 07/02/1980 Today's Date: 12/21/2020   History of Present Illness 40 y.o. female with medical history significant of class III obesity, hyperlipidemia, type 2 diabetes currently not on medical therapy who is coming from [facility] emergency department where she presented with that week history of worsening chronic toe ulcer, which she has had for the past year plus, now coming with increased pain, development of purulent discharge and discoloration of second toe. Pt s/p transmet amputation.   Clinical Impression   [name] is a 40 year old woman with above medical history. Today she presents with pain, impaired balance, decreased activity tolerance and NWB status to RLE resulting in a sudden decline in functional abilities. On evaluation she was modified independent with bed mobility with heavy use of bed rails, min assist to stand with RW but only able tolerate less than 30 seconds due to pain. Patient is total assist for toileting as she is unable to transfer out of bed and reliant on bed pan and external catheter. She requires seated position and set up for UB ADLs and mod assist for LB dressing. Today patient significantly limited by pain and treatment today focused on activity tolerance with foot in dependent position (dangling) as well as discussion in regards to Dme needed for home. Patient will benefit from skilled OT services while in hospital to improve deficits and learn compensatory strategies as needed in order to return to PLOF.  Patient will need wide BSC at discharge as she may not be able to ambulate distance required to get to bathroom at home prior to discharge. Recommend HH services at discharge once able.     Recommendations for follow up therapy are one component of a multi-disciplinary discharge planning process, led by the attending physician.  Recommendations may be  updated based on patient status, additional functional criteria and insurance authorization.   Follow Up Recommendations  Home health OT    Assistance Recommended at Discharge Intermittent Supervision/Assistance  Functional Status Assessment  Patient has had a recent decline in their functional status and demonstrates the ability to make significant improvements in function in a reasonable and predictable amount of time.  Equipment Recommendations  BSC/3in1 ([employer] BSC)    Recommendations for Other Services       Precautions / Restrictions Precautions Precautions: Fall Restrictions Weight Bearing Restrictions: Yes RLE Weight Bearing: Non weight bearing      Mobility Bed Mobility Overal bed mobility: Modified Independent Bed Mobility: Supine to Sit;Sit to Supine     Supine to sit: Supervision;HOB elevated Sit to supine: Supervision   General bed mobility comments: heavy use of bed rails, increased time for pain    Transfers Overall transfer level: Needs assistance Equipment used: Rolling walker (2 wheels) Transfers: Sit to/from Stand Sit to Stand: Min assist           General transfer comment: Min assist to stand with RW - pushed up off of bed rail. Could not tolerate standing position due to pain.      Balance Overall balance assessment: Needs assistance Sitting-balance support: No upper extremity supported Sitting balance-Leahy Scale: Good     Standing balance support: Bilateral upper extremity supported Standing balance-Leahy Scale: Poor Standing balance comment: reliant on walker                           ADL either performed or assessed with  clinical judgement   ADL Overall ADL's : Needs assistance/impaired Eating/Feeding: Independent   Grooming: Set up;Sitting   Upper Body Bathing: Set up;Sitting   Lower Body Bathing: Minimal assistance;Sitting/lateral leans   Upper Body Dressing : Set up;Sitting   Lower Body Dressing: Moderate  assistance;Sit to/from Market researcher Details (indicate cue type and reason): unable today Toileting- Clothing Manipulation and Hygiene: Total assistance;Bed level       Functional mobility during ADLs: Minimal assistance;Rolling walker (2 wheels)       Vision Patient Visual Report: No change from baseline       Perception     Praxis      Pertinent Vitals/Pain Pain Assessment: 0-10 Pain Score: 10-Worst pain ever Pain Location: right foot - with dangling/standing Pain Descriptors / Indicators: Aching;Discomfort;Grimacing;Restless Pain Intervention(s): Limited activity within patient's tolerance;Monitored during session;Premedicated before session     Hand Dominance     Extremity/Trunk Assessment Upper Extremity Assessment Upper Extremity Assessment: Difficult to assess due to impaired cognition   Lower Extremity Assessment Lower Extremity Assessment: Defer to PT evaluation   Cervical / Trunk Assessment Cervical / Trunk Assessment: Normal   Communication Communication Communication: No difficulties   Cognition Arousal/Alertness: Awake/alert Behavior During Therapy: WFL for tasks assessed/performed Overall Cognitive Status: Within Functional Limits for tasks assessed                                 General Comments: pt's mom was present for the session     General Comments  IV line was leaking and was removed by RN during session.    Exercises General Exercises - Lower Extremity Long Arc Quad: AROM;Strengthening;Right;10 reps;Seated   Shoulder Instructions      Home Living Family/patient expects to be discharged to:: Private residence Living Arrangements: Parent Available Help at Discharge: Family Type of Home: House Home Access: Stairs to enter Secretary/administrator of Steps: 2   Home Layout: Two level               Home Equipment: None   Additional Comments: Pt's mom reported her room is upstairs and she will need a  few days to get a bed and for her to move her downstairs      Prior Functioning/Environment Prior Level of Function : Independent/Modified Independent                        OT Problem List: Decreased activity tolerance;Impaired balance (sitting and/or standing);Pain;Obesity;Decreased knowledge of precautions;Decreased knowledge of use of DME or AE      OT Treatment/Interventions: Self-care/ADL training;DME and/or AE instruction;Therapeutic activities;Balance training;Patient/family education    OT Goals(Current goals can be found in the care plan section) Acute Rehab OT Goals Patient Stated Goal: to get to bathroom OT Goal Formulation: With patient Time For Goal Achievement: 01/04/21 Potential to Achieve Goals: Good  OT Frequency: Min 2X/week   Barriers to D/C:            Co-evaluation              AM-PAC OT "6 Clicks" Daily Activity     Outcome Measure Help from another person eating meals?: None Help from another person taking care of personal grooming?: A Little Help from another person toileting, which includes using toliet, bedpan, or urinal?: Total Help from another person bathing (including washing, rinsing, drying)?: A Little Help from another person to  put on and taking off regular upper body clothing?: A Little Help from another person to put on and taking off regular lower body clothing?: A Lot 6 Click Score: 16   End of Session Equipment Utilized During Treatment: Rolling walker (2 wheels) Nurse Communication:  (may need increased pain medicine for activity)  Activity Tolerance: Patient limited by pain Patient left: in bed;with call bell/phone within reach  OT Visit Diagnosis: Unsteadiness on feet (R26.81);Pain                Time: 3500-9381 OT Time Calculation (min): 33 min Charges:  OT General Charges $OT Visit: 1 Visit OT Evaluation $OT Eval Low Complexity: 1 Low OT Treatments $Therapeutic Activity: 8-22 mins  Zong Mcquarrie, OTR/L Acute  Care Rehab Services  Office [phone] Pager: [phone]   Kelli Churn 12/21/2020, 2:02 PM

## 2020-12-21 NOTE — Progress Notes (Signed)
Peripherally Inserted Central Catheter Placement  The IV Nurse has discussed with the patient and/or persons authorized to consent for the patient, the purpose of this procedure and the potential benefits and risks involved with this procedure.  The benefits include less needle sticks, lab draws from the catheter, and the patient may be discharged home with the catheter. Risks include, but not limited to, infection, bleeding, blood clot (thrombus formation), and puncture of an artery; nerve damage and irregular heartbeat and possibility to perform a PICC exchange if needed/ordered by physician.  Alternatives to this procedure were also discussed.  Bard Power PICC patient education guide, fact sheet on infection prevention and patient information card has been provided to patient /or left at bedside.    PICC Placement Documentation  PICC Single Lumen 12/21/20 Right Basilic 40 cm 0 cm (Active)  Indication for Insertion or Continuance of Line Home intravenous therapies (PICC only) 12/21/20 1700  Exposed Catheter (cm) 0 cm 12/21/20 1700  Site Assessment Clean;Dry;Intact 12/21/20 1700  Line Status Flushed;Saline locked;Blood return noted 12/21/20 1700  Dressing Type Transparent;Securing device 12/21/20 1700  Dressing Status Clean;Dry;Intact 12/21/20 1700  Antimicrobial disc in place? Yes 12/21/20 1700  Safety Lock Not Applicable 12/21/20 1700  Line Care Connections checked and tightened 12/21/20 1700  Dressing Intervention New dressing 12/21/20 1700  Dressing Change Due 12/28/20 12/21/20 1700       Timmothy Sours 12/21/2020, 5:08 PM

## 2020-12-21 NOTE — Progress Notes (Signed)
Inpatient Diabetes Program Recommendations  AACE/ADA: New Consensus Statement on Inpatient Glycemic Control (2015)  Target Ranges:  Prepandial:   less than 140 mg/dL      Peak postprandial:   less than 180 mg/dL (1-2 hours)      Critically ill patients:  140 - 180 mg/dL   Lab Results  Component Value Date   GLUCAP 304 (H) 12/21/2020   HGBA1C 13.5 (H) 12/16/2020    Review of Glycemic Control  Diabetes history: type 2 Outpatient Diabetes medications: none Current orders for Inpatient glycemic control: 70/30 insulin 25 units BID, Novolog RESISTANT correction scale TID & 0-5 units HS scale  Inpatient Diabetes Program Recommendations:   Noted that insulin dosage was increased starting today. If blood sugars continue to elevated, recommend increasing Semglee to 28-30 units BID.  Spoke with patient and mother at the bedside. Patient and mother willing to purchase Insulin pens from [employer] $42.88/box of 5 pens,and needles. Mother is familiar with insulin pens.   For discharge: Novolin ReliOn 70/30 Flexpen Insulin pen order (# [id number]) Insulin pen needles 31 gauge X 5 mm (order # [id number])  Smith Mince RN BSN CDE Diabetes Coordinator Pager: [phone]  8am-5pm

## 2020-12-21 NOTE — Evaluation (Signed)
Physical Therapy Evaluation Patient Details Name: [name] MRN: [id number] DOB: 1980-12-13 Today's Date: 12/21/2020  History of Present Illness  40 y.o. female with medical history significant of class III obesity, hyperlipidemia, type 2 diabetes currently not on medical therapy who is coming from [facility] emergency department where she presented with that week history of worsening chronic toe ulcer, which she has had for the past year plus, now coming with increased pain, development of purulent discharge and discoloration of second toe. Pt s/p transmet amputation.  Clinical Impression  Pt presents with dependencies in mobility secondary to the above diagnosis. Pt was able to hop a few side steps in pregait activities with RW. Pt did have 1 LOB requiring therapist assist. Pt is NWB and will benefit from a surgical shoe and continued acute skilled PT to maximize mobility and independence for d/c home with her mother. Recommend a w/c with elevating leg rest on R secondary to NWB status.      Recommendations for follow up therapy are one component of a multi-disciplinary discharge planning process, led by the attending physician.  Recommendations may be updated based on patient status, additional functional criteria and insurance authorization.  Follow Up Recommendations Home health PT    Assistance Recommended at Discharge Intermittent Supervision/Assistance  Functional Status Assessment Patient has had a recent decline in their functional status and demonstrates the ability to make significant improvements in function in a reasonable and predictable amount of time.  Equipment Recommendations  Rolling walker (2 wheels);BSC/3in1;Wheelchair (measurements PT)    Recommendations for Other Services       Precautions / Restrictions Precautions Precautions: Fall Restrictions RLE Weight Bearing: Non weight bearing      Mobility  Bed Mobility Overal bed mobility: Needs  Assistance Bed Mobility: Supine to Sit     Supine to sit: Min guard;HOB elevated     General bed mobility comments: heavy use of bed rails, increased time for pain    Transfers Overall transfer level: Needs assistance Equipment used: Rolling walker (2 wheels) Transfers: Sit to/from Stand Sit to Stand: Min assist;From elevated surface           General transfer comment: cues for hand placement    Ambulation/Gait             Pre-gait activities: side stepping with RW min assist, had difficulty hopping, 1 LOB posterior that the therapist had to assist to prevent a fall, was able to maintain R NWB    Stairs            Wheelchair Mobility    Modified Rankin (Stroke Patients Only)       Balance Overall balance assessment: Needs assistance Sitting-balance support: No upper extremity supported Sitting balance-Leahy Scale: Good     Standing balance support: Bilateral upper extremity supported Standing balance-Leahy Scale: Fair                               Pertinent Vitals/Pain Pain Assessment: 0-10 Pain Score: 8  Pain Location: right foot Pain Descriptors / Indicators: Aching;Discomfort;Grimacing Pain Intervention(s): Limited activity within patient's tolerance;Repositioned;Monitored during session;Premedicated before session    Home Living Family/patient expects to be discharged to:: Private residence Living Arrangements: Parent Available Help at Discharge: Family Type of Home: House Home Access: Stairs to enter   Secretary/administrator of Steps: 2   Home Layout: Two level Home Equipment: None Additional Comments: Pt's mom reported her room  is upstairs and she will need a few days to get a bed and for her to move her downstairs    Prior Function Prior Level of Function : Independent/Modified Independent                     Hand Dominance   Dominant Hand: Right    Extremity/Trunk Assessment        Lower Extremity  Assessment Lower Extremity Assessment: Difficult to assess due to impaired cognition    Cervical / Trunk Assessment Cervical / Trunk Assessment: Normal  Communication   Communication: No difficulties  Cognition Arousal/Alertness: Awake/alert Behavior During Therapy: WFL for tasks assessed/performed Overall Cognitive Status: Within Functional Limits for tasks assessed                                 General Comments: pt's mom was present for the session        General Comments General comments (skin integrity, edema, etc.): IV line was leaking and was removed by RN during session.    Exercises General Exercises - Lower Extremity Long Arc Quad: AROM;Strengthening;Right;10 reps;Seated   Assessment/Plan    PT Assessment Patient needs continued PT services  PT Problem List Decreased strength;Decreased balance;Decreased knowledge of precautions;Pain;Decreased mobility;Obesity;Decreased activity tolerance       PT Treatment Interventions DME instruction;Functional mobility training;Balance training;Patient/family education;Gait training;Therapeutic activities;Neuromuscular re-education;Therapeutic exercise;Stair training    PT Goals (Current goals can be found in the Care Plan section)  Acute Rehab PT Goals Patient Stated Goal: To walk PT Goal Formulation: With patient Time For Goal Achievement: 01/04/21 Potential to Achieve Goals: Good    Frequency Min 5X/week   Barriers to discharge        Co-evaluation               AM-PAC PT "6 Clicks" Mobility  Outcome Measure Help needed turning from your back to your side while in a flat bed without using bedrails?: None Help needed moving from lying on your back to sitting on the side of a flat bed without using bedrails?: A Little Help needed moving to and from a bed to a chair (including a wheelchair)?: A Little Help needed standing up from a chair using your arms (e.g., wheelchair or bedside chair)?: A  Little Help needed to walk in hospital room?: A Lot Help needed climbing 3-5 steps with a railing? : Total 6 Click Score: 16    End of Session Equipment Utilized During Treatment: Gait belt Activity Tolerance: Patient tolerated treatment well Patient left: in bed;with call bell/phone within reach;with family/visitor present Nurse Communication: Mobility status PT Visit Diagnosis: Unsteadiness on feet (R26.81);Difficulty in walking, not elsewhere classified (R26.2);Pain Pain - Right/Left: Right Pain - part of body: Ankle and joints of foot    Time: 1212-1248 PT Time Calculation (min) (ACUTE ONLY): 36 min   Charges:   PT Evaluation $PT Eval Moderate Complexity: 1 Mod PT Treatments $Gait Training: 8-22 mins         Greggory Stallion 12/21/2020, 12:53 PM

## 2020-12-21 NOTE — Progress Notes (Signed)
Reached out to primary RN to see if patient has had the opportunity to talked with her mom so we can place PICC. Mom is at bedside and patient is requesting to eat and shower before we place her PICC. We will try to place it later today .

## 2020-12-22 DIAGNOSIS — E785 Hyperlipidemia, unspecified: Secondary | ICD-10-CM | POA: Diagnosis not present

## 2020-12-22 DIAGNOSIS — M869 Osteomyelitis, unspecified: Secondary | ICD-10-CM | POA: Diagnosis not present

## 2020-12-22 DIAGNOSIS — E1165 Type 2 diabetes mellitus with hyperglycemia: Secondary | ICD-10-CM | POA: Diagnosis not present

## 2020-12-22 LAB — CBC WITH DIFFERENTIAL/PLATELET
Abs Immature Granulocytes: 0.17 10*3/uL — ABNORMAL HIGH (ref 0.00–0.07)
Basophils Absolute: 0.1 10*3/uL (ref 0.0–0.1)
Basophils Relative: 0 %
Eosinophils Absolute: 0.2 10*3/uL (ref 0.0–0.5)
Eosinophils Relative: 1 %
HCT: 33.7 % — ABNORMAL LOW (ref 36.0–46.0)
Hemoglobin: 10.5 g/dL — ABNORMAL LOW (ref 12.0–15.0)
Immature Granulocytes: 1 %
Lymphocytes Relative: 11 %
Lymphs Abs: 1.6 10*3/uL (ref 0.7–4.0)
MCH: 26.6 pg (ref 26.0–34.0)
MCHC: 31.2 g/dL (ref 30.0–36.0)
MCV: 85.3 fL (ref 80.0–100.0)
Monocytes Absolute: 1.4 10*3/uL — ABNORMAL HIGH (ref 0.1–1.0)
Monocytes Relative: 10 %
Neutro Abs: 11.5 10*3/uL — ABNORMAL HIGH (ref 1.7–7.7)
Neutrophils Relative %: 77 %
Platelets: 367 10*3/uL (ref 150–400)
RBC: 3.95 MIL/uL (ref 3.87–5.11)
RDW: 14.4 % (ref 11.5–15.5)
WBC: 14.8 10*3/uL — ABNORMAL HIGH (ref 4.0–10.5)
nRBC: 0 % (ref 0.0–0.2)

## 2020-12-22 LAB — GLUCOSE, CAPILLARY
Glucose-Capillary: 314 mg/dL — ABNORMAL HIGH (ref 70–99)
Glucose-Capillary: 219 mg/dL — ABNORMAL HIGH (ref 70–99)
Glucose-Capillary: 322 mg/dL — ABNORMAL HIGH (ref 70–99)
Glucose-Capillary: 318 mg/dL — ABNORMAL HIGH (ref 70–99)

## 2020-12-22 LAB — BASIC METABOLIC PANEL
Anion gap: 8 (ref 5–15)
BUN: 17 mg/dL (ref 6–20)
CO2: 30 mmol/L (ref 22–32)
Calcium: 8.7 mg/dL — ABNORMAL LOW (ref 8.9–10.3)
Chloride: 92 mmol/L — ABNORMAL LOW (ref 98–111)
Creatinine, Ser: 0.78 mg/dL (ref 0.44–1.00)
GFR, Estimated: >60 mL/min (ref >60)
Glucose, Bld: 369 mg/dL — ABNORMAL HIGH (ref 70–99)
Potassium: 4.6 mmol/L (ref 3.5–5.1)
Sodium: 130 mmol/L — ABNORMAL LOW (ref 135–145)

## 2020-12-22 LAB — FUNGUS STAIN

## 2020-12-22 MED ORDER — SORBITOL 70 % SOLN
30.0000 mL | Status: AC
  Administered 2020-12-22: 30 mL via ORAL
  Filled 2020-12-22 (×2): qty 30

## 2020-12-22 MED ORDER — GABAPENTIN 300 MG PO CAPS
600.0000 mg | ORAL_CAPSULE | Freq: Three times a day (TID) | ORAL | Status: DC
  Administered 2020-12-22 – 2020-12-23 (×4): 600 mg via ORAL
  Filled 2020-12-22 (×4): qty 2

## 2020-12-22 MED ORDER — ENOXAPARIN SODIUM 60 MG/0.6ML IJ SOSY
60.0000 mg | PREFILLED_SYRINGE | Freq: Every day | INTRAMUSCULAR | Status: DC
  Administered 2020-12-22 – 2020-12-23 (×2): 60 mg via SUBCUTANEOUS
  Filled 2020-12-22 (×4): qty 0.6

## 2020-12-22 MED ORDER — INSULIN ASPART PROT & ASPART (70-30 MIX) 100 UNIT/ML ~~LOC~~ SUSP
32.0000 [IU] | Freq: Two times a day (BID) | SUBCUTANEOUS | Status: DC
Start: 2020-12-22 — End: 2020-12-22

## 2020-12-22 MED ORDER — INSULIN ASPART PROT & ASPART (70-30 MIX) 100 UNIT/ML ~~LOC~~ SUSP
34.0000 [IU] | Freq: Two times a day (BID) | SUBCUTANEOUS | Status: DC
  Administered 2020-12-22: 34 [IU] via SUBCUTANEOUS

## 2020-12-22 NOTE — Progress Notes (Signed)
Subjective: 4 Days Post-Op Procedure(s) (LRB): TRANSMETATARSAL AMPUTATION (Right)  Patient reports minimal pain. Has renewed appetite. She is focused on controlling her blood sugars moving forward and healing her right foot.    Objective:   VITALS:  Temp:  [98.4 F (36.9 C)-99.1 F (37.3 C)] 98.4 F (36.9 C) (12/09 1339) Pulse Rate:  [96-103] 103 (12/09 1339) Resp:  [16-18] 18 (12/09 1339) BP: (135-143)/(77-82) 143/77 (12/09 1339) SpO2:  [96 %-99 %] 99 % (12/09 1339)  Physical Exam  Gen: AAOx3, NAD   Right lower extremity: S/p TMA Incision c/d/I, mild serous drainage SILT over foot DP, PT 2+ to palpation CR<2s   LABS Recent Labs    12/20/20 0434 12/21/20 0444 12/22/20 0354  HGB 10.8* 11.4* 10.5*  WBC 17.3* 15.5* 14.8*  PLT 305 328 367   Recent Labs    12/21/20 0444 12/22/20 0354  NA 129* 130*  K 4.7 4.6  CL 93* 92*  CO2 25 30  BUN 14 17  CREATININE 0.73 0.78  GLUCOSE 332* 369*   No results for input(s): LABPT, INR in the last 72 hours.   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 Recommend a full course of antibiotic therapy given extent of infection and poor host factors placing her at high risk to limb/life -strict NWB operative extremity until sutures out, maximum elevation -dry dressing changes every other day until follow up -pain meds and VTE ppx per primary team -physical therapy to assist with NWB gait training RLE -nutrition referral and treatment of low albumin/prealbumin along with dietary changes for diabetes control -optimize glycemic control & counseling -discharge home per primary team -sutures out in 4 weeks in outpatient  office, return to clinic 2 weeks postop for wound check  Netta Cedars 12/22/2020, 9:30 PM

## 2020-12-22 NOTE — TOC Progression Note (Signed)
Transition of Care [facility]) - Progression Note   Patient Details  Name: KERLINE TRAHAN MRN: [id number] Date of Birth: 18-Jul-1980  [facility] of [facility]) CM/SW Contact  Ewing Schlein, LCSW Phone Number: 12/22/2020, 12:26 PM  Clinical Narrative: Orders for bariatric DME have been placed. [employer] to deliver equipment to patient's home. OPAT orders signed and LOG for [employer] for [facility] has been sent to the agency. Pam with [employer] aware the patient will discharge home tomorrow. TOC to follow.  Expected Discharge Plan: Home w Home Health Services Barriers to Discharge: Continued Medical Work up, Inadequate or no insurance  Expected Discharge Plan and Services Expected Discharge Plan: Home w Home Health Services In-house Referral: Clinical Social Work Post Acute Care Choice: Home Health Living arrangements for the past 2 months: Single Family Home           DME Arranged: N/A DME Agency: NA HH Arranged: IV Antibiotics, RN HH Agency: [facility] Date HH Agency Contacted: 12/20/20 Representative spoke with at [facility] Agency: Pam  Readmission Risk Interventions No flowsheet data found.

## 2020-12-22 NOTE — Progress Notes (Signed)
Occupational Therapy Treatment Patient Details Name: [name] MRN: [id number] DOB: 1980/01/21 Today's Date: 12/22/2020   History of present illness 40 y.o. female with medical history significant of class III obesity, hyperlipidemia, type 2 diabetes currently not on medical therapy who is coming from [facility] emergency department where she presented with that week history of worsening chronic toe ulcer, which she has had for the past year plus, now coming with increased pain, development of purulent discharge and discoloration of second toe. Pt s/p transmet amputation.   OT comments  Treatment focused on [facility] transfers and learning compensatory strategies for dressing and toileting in order to return home. Patient overall min guard with RW to pivot transfer. She was unable to clean herself on [facility] or hovering over with use of walker due to loss of balance. Determined sitting at edge of bed for toileting was the safest option. Patient also demonstrated ability to don LB clothing at bed level. Also discussed furniture arrangement at home for safe transfers. Patient verbalized understanding.     Recommendations for follow up therapy are one component of a multi-disciplinary discharge planning process, led by the attending physician.  Recommendations may be updated based on patient status, additional functional criteria and insurance authorization.    Follow Up Recommendations  Home health OT    Assistance Recommended at Discharge    Equipment Recommendations  BSC/3in1 (bari)    Recommendations for Other Services      Precautions / Restrictions Precautions Precautions: Fall Restrictions Weight Bearing Restrictions: Yes RLE Weight Bearing: Non weight bearing       Mobility Bed Mobility Overal bed mobility: Modified Independent             General bed mobility comments: Modified independent with use of bed rails    Transfers Overall transfer level: Needs  assistance Equipment used: Rolling walker (2 wheels) Transfers: Sit to/from Stand Sit to Stand: Min guard           General transfer comment: She is overall min guard with RW to pivot transfer to [facility] and recliner. She is min assist to stabilize with loss of balance.     Balance Overall balance assessment: Needs assistance   Sitting balance-Leahy Scale: Normal     Standing balance support: Bilateral upper extremity supported Standing balance-Leahy Scale: Poor                             ADL either performed or assessed with clinical judgement   ADL Overall ADL's : Needs assistance/impaired                 Upper Body Dressing : Set up Upper Body Dressing Details (indicate cue type and reason): patient dressed in shirt Lower Body Dressing: Set up;Bed level Lower Body Dressing Details (indicate cue type and reason): set up - patient demonstrated ability to don pants at bed level Toilet Transfer: Minimal assistance;BSC/3in1 Toilet Transfer Details (indicate cue type and reason): Worked on [facility] transfer - required min assist with RW. Toileting- Clothing Manipulation and Hygiene: Min guard;Bed level Toileting - Clothing Manipulation Details (indicate cue type and reason): Attempts to perform toileting on BSC resulted in a LOB. Patient able to perform leanign and perform cleaning herself at edge of bed.     Functional mobility during ADLs: Minimal assistance;Rolling walker (2 wheels)      Extremity/Trunk Assessment Upper Extremity Assessment Upper Extremity Assessment: Overall WFL for tasks assessed  Cervical / Trunk Assessment Cervical / Trunk Assessment: Normal    Vision Patient Visual Report: No change from baseline     Perception     Praxis      Cognition Arousal/Alertness: Awake/alert Behavior During Therapy: WFL for tasks assessed/performed Overall Cognitive Status: Within Functional Limits for tasks assessed                                             Exercises     Shoulder Instructions       General Comments      Pertinent Vitals/ Pain       Pain Assessment: Faces Faces Pain Scale: Hurts whole lot Pain Location: right foot - with dangling/standing Pain Descriptors / Indicators: Aching;Discomfort;Grimacing;Restless Pain Intervention(s): Limited activity within patient's tolerance;Premedicated before session  Home Living                                          Prior Functioning/Environment              Frequency  Min 2X/week        Progress Toward Goals  OT Goals(current goals can now be found in the care plan section)  Progress towards OT goals: Progressing toward goals  Acute Rehab OT Goals Patient Stated Goal: to get to bathroom OT Goal Formulation: With patient Time For Goal Achievement: 01/04/21 Potential to Achieve Goals: Good  Plan Discharge plan remains appropriate    Co-evaluation                 AM-PAC OT "6 Clicks" Daily Activity     Outcome Measure   Help from another person eating meals?: None Help from another person taking care of personal grooming?: A Little Help from another person toileting, which includes using toliet, bedpan, or urinal?: A Little Help from another person bathing (including washing, rinsing, drying)?: A Little Help from another person to put on and taking off regular upper body clothing?: A Little Help from another person to put on and taking off regular lower body clothing?: A Little 6 Click Score: 19    End of Session Equipment Utilized During Treatment: Rolling walker (2 wheels)  OT Visit Diagnosis: Unsteadiness on feet (R26.81);Pain   Activity Tolerance Patient limited by pain   Patient Left in chair;with call bell/phone within reach;with chair alarm set   Nurse Communication Mobility status        Time: 8676-7209 OT Time Calculation (min): 37 min  Charges: OT General Charges $OT Visit: 1  Visit OT Treatments $Self Care/Home Management : 8-22 mins  Waldron Session, OTR/L Acute Care Rehab Services  Office [phone] Pager: [phone]   Kelli Churn 12/22/2020, 1:38 PM

## 2020-12-22 NOTE — Progress Notes (Addendum)
PROGRESS NOTE    [name]  [id number] DOB: 01/22/1980 DOA: 12/16/2020 PCP: Pcp, No    Chief Complaint  Patient presents with   Foot Pain    Brief Narrative:  40 y.o. female with medical history significant of class III obesity, hyperlipidemia, type 2 diabetes currently not on medical therapy who is coming from [place] emergency department where she presented with that week history of worsening chronic toe ulcer, which she has had for the past year plus, now coming with increased pain, development of purulent discharge and discoloration of second toe.  She does not remember having any trauma to the area.  No fever, chills, but feels fatigue.  No rhinorrhea, sore throat, wheezing, dyspnea or hemoptysis.  No chest pain, palpitations, diaphoresis, PND, orthopnea or pitting edema of the lower extremities.  She has some nausea and vomiting earlier today.  No abdominal pain, diarrhea, constipation, melena or hematochezia.  No dysuria, frequency or hematuria.  She does not check her blood glucose regularly and has not been under treatment recently.  She denied polyuria, polydipsia, polyphagia or blurred vision.  She does not have a PCP currently. Pt was admitted for concerns of osteomyelitis of R foot. 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 ID consulted for antibiotic recommendations and duration     Assessment & Plan:   Principal Problem:   Osteomyelitis of second toe of right foot ([place]) Active Problems:   Type 2 diabetes mellitus with hyperglycemia (HCC)   Class 3 obesity (HCC)   Hyperlipidemia   Osteomyelitis of right foot (HCC)  1 osteomyelitis of second toe of the right foot -MRI on admission with findings concerning for osteomyelitis of the phalanges and metatarsal of the second ray with soft tissue infection.  Abscess in dorsal flexor hallucis brevis and regional myositis and  extensive cellulitis of forefoot and toes. -Patient seen in consultation by orthopedics, underwent ABIs. -Patient is status post right foot transmetatarsal amputation and I&D of deep abscess. -Cultures performed perioperatively with abundant Streptococcus agalactiae. -Patient being followed by orthopedics who recommended nonweightbearing to right lower extremity, extended course of IV antibiotics given extent of infection and poorly controlled diabetes, ID recommendations per orthopedics, dressing changes. -ID consulted and recommended de-escalation of IV antibiotics to Rocephin and plan for 6 weeks of treatment from date of amputation. -ID also recommending 2 weeks of oral Flagyl. -ID recommending PICC line placement (12/21/2020) with outpatient follow-up with ID in the outpatient setting. -PICC line ordered and placed 12/21/2020. -Increase Neurontin to 600 mg p.o. 3 times daily for better pain control. -Outpatient follow-up with orthopedics. -PT/OT evaluated patient and recommending home health therapies as well as wheelchair, 3 and 1, rolling walker.    2.  Poorly controlled diabetes mellitus type 2 -Hemoglobin A1c 13.5 (12/16/2020) -CBG noted at 314 this morning. -Patient with no insurance, difficulty getting medications and as such patient started on 70/30. -Increase 70/30 to 34 units twice daily. -Sinew SSI.   -Continue Neurontin.   -Diabetes coordinator following.    3.  Obesity -Lifestyle modification. -Outpatient follow-up with PCP.  4.  Hyperlipidemia -Fasting lipid panel with LDL of 58. -Outpatient follow-up.  5.  Hyponatremia -Urinalysis ordered and pending.   -Urine electrolytes pending.   -Urine osmolality pending.   -Improving with hydration.   -Follow.  6.  Hypertension/elevated blood pressure -Initially felt component of pain associated with elevated blood pressure.  -Blood pressure improved on Norvasc 5 mg daily  which we will continue.   -Outpatient  follow-up.  7.  Constipation -Sorbitol p.o. every 2 hours x2 doses.   DVT prophylaxis: SCDs Code Status: Full Family Communication: Updated patient.  No family at bedside. Disposition:   Status is: Inpatient  Remains inpatient appropriate because: Severity of illness       Consultants:  ID: Dr. West Bali 12/20/2020 Orthopedics: Dr.Ramanathan 12/17/2020  Procedures:  Plain films of the right foot 12/16/2020 Plain films of the right foot 12/17/2020 ABI 12/17/2020 Right foot transmetatarsal amputation/right foot incision and drainage of deep abscess per Dr. Kathaleen Bury, orthopedics 12/18/2020  Antimicrobials:  Oral Flagyl 12/20/2020>>> IV cefepime 12/16/2020>>>>> 12/20/2020 IV Rocephin 12/20/2020>>>> IV vancomycin 12/16/2020>>>> 12/20/2020   Subjective: Patient c/o pain in foot. No CP, No SOB. No BM in 7 days per patient and RN.  Tolerating current diet.  Objective: Vitals:   12/20/20 2101 12/21/20 1431 12/21/20 2053 12/22/20 0501  BP: (!) 148/68 (!) 151/77 (!) 155/118 135/82  Pulse: (!) 106 (!) 107 (!) 108 96  Resp: 18 14  16   Temp: 99.7 F (37.6 C) 99.8 F (37.7 C) 98.7 F (37.1 C) 99.1 F (37.3 C)  TempSrc: Oral Oral Oral Oral  SpO2: 99% 100% 100% 96%  Weight:      Height:        Intake/Output Summary (Last 24 hours) at 12/22/2020 1309 Last data filed at 12/22/2020 1030 Gross per 24 hour  Intake 4057.3 ml  Output 3800 ml  Net 257.3 ml    Filed Weights   12/16/20 1358 12/18/20 1211  Weight: (!) 138.9 kg (!) 138.9 kg    Examination:  General exam: NAD. Respiratory system: Lungs clear to auscultation bilaterally.  No wheezes, no crackles, no rhonchi.  Normal respiratory effort.  Cardiovascular system: RRR no murmurs rubs or gallops.  No JVD.  No lower extremity edema.   Gastrointestinal system: Abdomen is soft, nontender, nondistended, positive bowel sounds.  No rebound.  No guarding. Central nervous system: Alert and oriented. No focal neurological  deficits. Extremities: Status post right transmetatarsal amputation with right foot in bandage with some blood noted on dressing.  Skin: No rashes, lesions or ulcers Psychiatry: Judgement and insight appear normal. Mood & affect appropriate.     Data Reviewed: I have personally reviewed following labs and imaging studies  CBC: Recent Labs  Lab 12/16/20 0345 12/17/20 0425 12/18/20 0434 12/19/20 0445 12/20/20 0434 12/21/20 0444 12/22/20 0354  WBC 19.9*   < > 16.1* 19.8* 17.3* 15.5* 14.8*  NEUTROABS 16.3*  --   --   --   --  11.8* 11.5*  HGB 12.7   < > 12.3 10.8* 10.8* 11.4* 10.5*  HCT 39.0   < > 38.5 34.3* 34.8* 37.4 33.7*  MCV 82.3   < > 85.0 84.5 85.9 87.6 85.3  PLT 444*   < > 337 324 305 328 367   < > = values in this interval not displayed.     Basic Metabolic Panel: Recent Labs  Lab 12/17/20 0425 12/19/20 0445 12/20/20 0434 12/21/20 0444 12/22/20 0354  NA 129* 129* 128* 129* 130*  K 3.8 4.2 4.2 4.7 4.6  CL 97* 96* 92* 93* 92*  CO2 22 22 25 25 30   GLUCOSE 231* 362* 369* 332* 369*  BUN 17 14 16 14 17   CREATININE 0.84 0.81 0.70 0.73 0.78  CALCIUM 8.2* 8.2* 8.4* 8.5* 8.7*  MG  --   --   --  2.0  --      GFR:  Estimated Creatinine Clearance: 142 mL/min (by C-G formula based on SCr of 0.78 mg/dL).  Liver Function Tests: Recent Labs  Lab 12/16/20 0345 12/17/20 0425 12/19/20 0445 12/20/20 0434  AST 13* 18 22 22   ALT 9 10 12 12   ALKPHOS 112 86 84 94  BILITOT 0.9 0.9 0.9 0.7  PROT 8.6* 7.2 7.3 7.7  ALBUMIN 2.6* 2.3* 2.1* 2.3*     CBG: Recent Labs  Lab 12/21/20 1132 12/21/20 1802 12/21/20 2049 12/22/20 0723 12/22/20 1057  GLUCAP 318* 295* 278* 314* 219*      Recent Results (from the past 240 hour(s))  Blood culture (routine x 2)     Status: None   Collection Time: 12/16/20  4:15 AM   Specimen: BLOOD  Result Value Ref Range Status   Specimen Description   Final    BLOOD RIGHT ANTECUBITAL Performed at [facility], [address] [place], [place] [address]    Special Requests   Final    BOTTLES DRAWN AEROBIC AND ANAEROBIC Blood Culture results may not be optimal due to an inadequate volume of blood received in culture bottles Performed at [facility], [address] [place], [place] [address]    Culture   Final    NO GROWTH 5 DAYS Performed at [facility], [place] [address], [place], [place] [address]    Report Status 12/21/2020 FINAL  Final  Blood culture (routine x 2)     Status: None   Collection Time: 12/16/20  4:20 AM   Specimen: BLOOD  Result Value Ref Range Status   Specimen Description   Final    BLOOD RIGHT FOREARM Performed at [facility], [address] [place], [place] [address]    Special Requests   Final    BOTTLES DRAWN AEROBIC AND ANAEROBIC Blood Culture results may not be optimal due to an inadequate volume of blood received in culture bottles Performed at [facility], [address] [place], [place] [address]    Culture   Final    NO GROWTH 5 DAYS Performed at [facility], [place] [address], [place], [place] [address]    Report Status 12/21/2020 FINAL  Final  Resp Panel by RT-PCR (Flu A&B, Covid) Nasopharyngeal Swab     Status: None   Collection Time: 12/16/20  5:20 AM   Specimen: Nasopharyngeal Swab; Nasopharyngeal(NP) swabs in vial transport medium  Result Value Ref Range Status   SARS Coronavirus 2 by RT PCR NEGATIVE NEGATIVE Final    Comment: (NOTE) SARS-CoV-2 target nucleic acids are NOT DETECTED.  The SARS-CoV-2 RNA is generally detectable in upper respiratory specimens during the acute phase of infection. The lowest concentration of SARS-CoV-2 viral copies this assay can detect is 138 copies/mL. A negative result does not preclude SARS-Cov-2 infection and should not be used as the sole basis for treatment or other patient management decisions. A negative result may occur with  improper specimen collection/handling,  submission of specimen other than nasopharyngeal swab, presence of viral mutation(s) within the areas targeted by this assay, and inadequate number of viral copies(<138 copies/mL). A negative result must be combined with clinical observations, patient history, and epidemiological information. The expected result is Negative.  Fact Sheet for Patients:  EntrepreneurPulse.com.au  Fact Sheet for Healthcare Providers:  IncredibleEmployment.be  This test is no t yet approved or cleared by the [place] FDA and  has been authorized for detection and/or diagnosis  of SARS-CoV-2 by FDA under an Emergency Use Authorization (EUA). This EUA will remain  in effect (meaning this test can be used) for the duration of the COVID-19 declaration under Section 564(b)(1) of the Act, 21 U.S.C.section 360bbb-3(b)(1), unless the authorization is terminated  or revoked sooner.       Influenza A by PCR NEGATIVE NEGATIVE Final   Influenza B by PCR NEGATIVE NEGATIVE Final    Comment: (NOTE) The Xpert Xpress SARS-CoV-2/FLU/RSV plus assay is intended as an aid in the diagnosis of influenza from Nasopharyngeal swab specimens and should not be used as a sole basis for treatment. Nasal washings and aspirates are unacceptable for Xpert Xpress SARS-CoV-2/FLU/RSV testing.  Fact Sheet for Patients: EntrepreneurPulse.com.au  Fact Sheet for Healthcare Providers: IncredibleEmployment.be  This test is not yet approved or cleared by the [place] FDA and has been authorized for detection and/or diagnosis of SARS-CoV-2 by FDA under an Emergency Use Authorization (EUA). This EUA will remain in effect (meaning this test can be used) for the duration of the COVID-19 declaration under Section 564(b)(1) of the Act, 21 U.S.C. section 360bbb-3(b)(1), unless the authorization is terminated or revoked.  Performed at [facility], [address] [place], [place] [address]   Surgical pcr screen     Status: None   Collection Time: 12/17/20  1:16 PM   Specimen: Nasal Mucosa; Nasal Swab  Result Value Ref Range Status   MRSA, PCR NEGATIVE NEGATIVE Final   Staphylococcus aureus NEGATIVE NEGATIVE Final    Comment: (NOTE) The Xpert SA Assay (FDA approved for NASAL specimens in patients 38 years of age and older), is one component of a comprehensive surveillance program. It is not intended to diagnose infection nor to guide or monitor treatment. Performed at [facility], [place] [address], [place], [place] [address]   Fungus Stain     Status: None   Collection Time: 12/18/20  4:12 PM   Specimen: PATH Other; Tissue  Result Value Ref Range Status   FUNGUS STAIN DUPLICATE  Final    Comment: (NOTE) Duplicate procedure ordered. Please refer to the following specimen for additional lab results.      [phone] Performed At: [facility] [place], [place] [id number] Rush Farmer MD [id number]    Fungal Source TISSUE  Final    Comment: Performed at [facility], [place] [address], [place], [place] [address]  Acid Fast Smear (AFB)     Status: None   Collection Time: 12/18/20  4:12 PM   Specimen: PATH Other; Tissue  Result Value Ref Range Status   AFB Specimen Processing Comment  Final    Comment: Tissue Grinding and Digestion/Decontamination   Acid Fast Smear Negative  Final    Comment: (NOTE) Performed At: [facility] [place], [place] [id number] Rush Farmer MD [id number]    Source (AFB) TISSUE  Final    Comment: Performed at [facility], [place] [address], [place], [place] [address]  Aerobic Culture w Gram Stain (superficial specimen)     Status: None   Collection Time: 12/18/20  4:12 PM   Specimen: Tissue  Result Value Ref Range Status   Specimen Description   Final    TISSUE Performed at [place] [address], [place], [place] [address]    Special Requests   Final    NONE Performed at [facility], [place] [address], [place], [place] [address]  Gram Stain   Final    ABUNDANT WBC PRESENT, PREDOMINANTLY MONONUCLEAR ABUNDANT GRAM POSITIVE COCCI FEW GRAM NEGATIVE RODS FEW GRAM POSITIVE RODS    Culture   Final    ABUNDANT STREPTOCOCCUS AGALACTIAE TESTING AGAINST S. AGALACTIAE NOT ROUTINELY PERFORMED DUE TO PREDICTABILITY OF AMP/PEN/VAN SUSCEPTIBILITY. Performed at [facility], [address], [place], [place] [address]    Report Status 12/20/2020 FINAL  Final          Radiology Studies: [place] EKG SITE RITE  Result Date: 12/20/2020 If Site Rite image not attached, placement could not be confirmed due to current cardiac rhythm.       Scheduled Meds:  amLODipine  5 mg Oral Daily   Chlorhexidine Gluconate Cloth  6 each Topical Daily   enoxaparin (LOVENOX) injection  60 mg Subcutaneous Daily   feeding supplement (GLUCERNA SHAKE)  237 mL Oral TID BM   gabapentin  300 mg Oral TID   insulin aspart  0-20 Units Subcutaneous TID WC   insulin aspart  0-5 Units Subcutaneous QHS   insulin aspart protamine- aspart  34 Units Subcutaneous BID WC   metroNIDAZOLE  500 mg Oral Q12H   multivitamin with minerals  1 tablet Oral Daily   nutrition supplement (JUVEN)  1 packet Oral BID WC   Ensure Max Protein  11 oz Oral BID   sodium chloride flush  10-40 mL Intracatheter Q12H   Continuous Infusions:  sodium chloride 125 mL/hr at 12/22/20 1254   cefTRIAXone (ROCEPHIN)  IV     methocarbamol (ROBAXIN) IV 500 mg (12/20/20 1505)     LOS: 6 days    Time spent: 40 minutes    Ramiro Harvest, MD [facility]   To contact the attending provider between 7A-7P or the covering provider during after hours 7P-7A, please log into the web site [email or web address] and access using universal [place] password for that web site. If you do not  have the password, please call the hospital operator.  12/22/2020, 1:09 PM

## 2020-12-22 NOTE — Progress Notes (Signed)
Physical Therapy Treatment Patient Details Name: [name] MRN: [id number] DOB: 05/05/1980 Today's Date: 12/22/2020   History of Present Illness 40 y.o. female with medical history significant of class III obesity, hyperlipidemia, type 2 diabetes currently not on medical therapy who is coming from [facility] emergency department where she presented with that week history of worsening chronic toe ulcer, which she has had for the past year plus, now coming with increased pain, development of purulent discharge and discoloration of second toe. Pt s/p transmet amputation.    PT Comments    Session focused on transfer training in anticipation of pt mobilizing with w/c primarily at discharge due to NWB status on Rt LE. Pt was min assist to rise initially with sit<>stand and progressed to min guard for power up. Pt required assist to guide walker with stand pivot to move to [facility]; pt not hoping but scooting on Lt foot due to balance deficits. Pt attempted to complete pericare in minisquat/stand at [facility] but experienced LOB requiring mod assist to stabilize and sit on commode. Pericare completed at bed level independently. Pt able to stand and pivot to Rt and Lt and EOS ended in recliner. Educated on home set up for w/c and BSC to make transfers safer as it is easier to move to Lt side. Educated pt on pain expectation with Rt LE being move from elevated to dependent position; pt verbalized understanding. She will benefit from HHPT to progress mobility and address functional mobility/safety at home.    Recommendations for follow up therapy are one component of a multi-disciplinary discharge planning process, led by the attending physician.  Recommendations may be updated based on patient status, additional functional criteria and insurance authorization.  Follow Up Recommendations  Home health PT     Assistance Recommended at Discharge Intermittent Supervision/Assistance  Equipment  Recommendations  Rolling walker (2 wheels);BSC/3in1;Wheelchair (measurements PT);Wheelchair cushion (measurements PT)    Recommendations for Other Services       Precautions / Restrictions Precautions Precautions: Fall Restrictions RLE Weight Bearing: Non weight bearing     Mobility  Bed Mobility Overal bed mobility: Modified Independent       Supine to sit: Modified independent (Device/Increase time);HOB elevated     General bed mobility comments: use of bed rail and extra time    Transfers Overall transfer level: Needs assistance Equipment used: Rolling walker (2 wheels) Transfers: Sit to/from Stand;Bed to chair/wheelchair/BSC Sit to Stand: Min assist;Min guard Stand pivot transfers: Min assist              Ambulation/Gait                   Stairs             Wheelchair Mobility    Modified Rankin (Stroke Patients Only)       Balance Overall balance assessment: Needs assistance Sitting-balance support: No upper extremity supported Sitting balance-Leahy Scale: Normal     Standing balance support: Bilateral upper extremity supported Standing balance-Leahy Scale: Poor Standing balance comment: reliant on walker                            Cognition Arousal/Alertness: Awake/alert Behavior During Therapy: WFL for tasks assessed/performed Overall Cognitive Status: Within Functional Limits for tasks assessed  Exercises      General Comments        Pertinent Vitals/Pain Pain Assessment: Faces Faces Pain Scale: Hurts whole lot Pain Location: right foot - with dangling/standing Pain Descriptors / Indicators: Aching;Discomfort;Grimacing;Restless Pain Intervention(s): Limited activity within patient's tolerance;Monitored during session;Repositioned    Home Living                          Prior Function            PT Goals (current goals can now be  found in the care plan section) Acute Rehab PT Goals PT Goal Formulation: With patient Time For Goal Achievement: 01/04/21 Potential to Achieve Goals: Good Progress towards PT goals: Progressing toward goals    Frequency    Min 5X/week      PT Plan Current plan remains appropriate    Co-evaluation              AM-PAC PT "6 Clicks" Mobility   Outcome Measure  Help needed turning from your back to your side while in a flat bed without using bedrails?: None Help needed moving from lying on your back to sitting on the side of a flat bed without using bedrails?: None Help needed moving to and from a bed to a chair (including a wheelchair)?: A Little Help needed standing up from a chair using your arms (e.g., wheelchair or bedside chair)?: A Little Help needed to walk in hospital room?: Total Help needed climbing 3-5 steps with a railing? : Total 6 Click Score: 16    End of Session Equipment Utilized During Treatment: Gait belt Activity Tolerance: Patient tolerated treatment well Patient left: in chair;with call bell/phone within reach;with chair alarm set Nurse Communication: Mobility status PT Visit Diagnosis: Unsteadiness on feet (R26.81);Difficulty in walking, not elsewhere classified (R26.2);Pain Pain - Right/Left: Right Pain - part of body: Ankle and joints of foot     Time: 8372-9021 PT Time Calculation (min) (ACUTE ONLY): 37 min  Charges:  $Therapeutic Activity: 8-22 mins                     Wynn Maudlin, DPT Acute Rehabilitation Services Office [phone] Pager [phone]    [name] 12/22/2020, 5:53 PM

## 2020-12-23 ENCOUNTER — Other Ambulatory Visit (HOSPITAL_COMMUNITY): Payer: Self-pay

## 2020-12-23 DIAGNOSIS — M869 Osteomyelitis, unspecified: Secondary | ICD-10-CM | POA: Diagnosis not present

## 2020-12-23 DIAGNOSIS — E785 Hyperlipidemia, unspecified: Secondary | ICD-10-CM | POA: Diagnosis not present

## 2020-12-23 DIAGNOSIS — E1165 Type 2 diabetes mellitus with hyperglycemia: Secondary | ICD-10-CM | POA: Diagnosis not present

## 2020-12-23 LAB — CBC WITH DIFFERENTIAL/PLATELET
Abs Immature Granulocytes: 0.18 10*3/uL — ABNORMAL HIGH (ref 0.00–0.07)
Basophils Absolute: 0.1 10*3/uL (ref 0.0–0.1)
Basophils Relative: 1 %
Eosinophils Absolute: 0.2 10*3/uL (ref 0.0–0.5)
Eosinophils Relative: 2 %
HCT: 32.9 % — ABNORMAL LOW (ref 36.0–46.0)
Hemoglobin: 10.2 g/dL — ABNORMAL LOW (ref 12.0–15.0)
Immature Granulocytes: 1 %
Lymphocytes Relative: 15 %
Lymphs Abs: 2 10*3/uL (ref 0.7–4.0)
MCH: 26.7 pg (ref 26.0–34.0)
MCHC: 31 g/dL (ref 30.0–36.0)
MCV: 86.1 fL (ref 80.0–100.0)
Monocytes Absolute: 1.4 10*3/uL — ABNORMAL HIGH (ref 0.1–1.0)
Monocytes Relative: 10 %
Neutro Abs: 9.6 10*3/uL — ABNORMAL HIGH (ref 1.7–7.7)
Neutrophils Relative %: 71 %
Platelets: 404 10*3/uL — ABNORMAL HIGH (ref 150–400)
RBC: 3.82 MIL/uL — ABNORMAL LOW (ref 3.87–5.11)
RDW: 14.6 % (ref 11.5–15.5)
WBC: 13.4 10*3/uL — ABNORMAL HIGH (ref 4.0–10.5)
nRBC: 0 % (ref 0.0–0.2)

## 2020-12-23 LAB — BASIC METABOLIC PANEL
Anion gap: 10 (ref 5–15)
BUN: 21 mg/dL — ABNORMAL HIGH (ref 6–20)
CO2: 29 mmol/L (ref 22–32)
Calcium: 8.6 mg/dL — ABNORMAL LOW (ref 8.9–10.3)
Chloride: 94 mmol/L — ABNORMAL LOW (ref 98–111)
Creatinine, Ser: 0.76 mg/dL (ref 0.44–1.00)
GFR, Estimated: >60 mL/min (ref >60)
Glucose, Bld: 303 mg/dL — ABNORMAL HIGH (ref 70–99)
Potassium: 4.3 mmol/L (ref 3.5–5.1)
Sodium: 133 mmol/L — ABNORMAL LOW (ref 135–145)

## 2020-12-23 LAB — GLUCOSE, CAPILLARY
Glucose-Capillary: 261 mg/dL — ABNORMAL HIGH (ref 70–99)
Glucose-Capillary: 240 mg/dL — ABNORMAL HIGH (ref 70–99)
Glucose-Capillary: 309 mg/dL — ABNORMAL HIGH (ref 70–99)
Glucose-Capillary: 289 mg/dL — ABNORMAL HIGH (ref 70–99)

## 2020-12-23 MED ORDER — ONDANSETRON HCL 4 MG PO TABS
4.0000 mg | ORAL_TABLET | Freq: Four times a day (QID) | ORAL | 0 refills | Status: DC | PRN
  Filled 2020-12-23: qty 20, 5d supply, fill #0

## 2020-12-23 MED ORDER — OXYCODONE HCL 5 MG PO TABS: 5.0000 mg | ORAL_TABLET | ORAL | 0 refills | Status: DC | PRN

## 2020-12-23 MED ORDER — ACETAMINOPHEN 325 MG PO TABS: 650.0000 mg | ORAL_TABLET | Freq: Four times a day (QID) | ORAL | Status: DC | PRN

## 2020-12-23 MED ORDER — GABAPENTIN 300 MG PO CAPS
600.0000 mg | ORAL_CAPSULE | Freq: Three times a day (TID) | ORAL | 1 refills | Status: DC
  Filled 2020-12-23: qty 180, 30d supply, fill #0
  Filled 2021-01-25: qty 180, 30d supply, fill #1

## 2020-12-23 MED ORDER — "PEN NEEDLES 3/16"" 31G X 5 MM MISC"
40.0000 [IU] | Freq: Two times a day (BID) | 0 refills | Status: DC
  Filled 2020-12-23: qty 100, 30d supply, fill #0

## 2020-12-23 MED ORDER — OXYCODONE HCL 5 MG PO TABS
5.0000 mg | ORAL_TABLET | Freq: Three times a day (TID) | ORAL | 0 refills | Status: DC
  Filled 2020-12-23: qty 15, 3d supply, fill #0

## 2020-12-23 MED ORDER — AMLODIPINE BESYLATE 5 MG PO TABS
5.0000 mg | ORAL_TABLET | Freq: Every day | ORAL | 1 refills | Status: DC
  Filled 2020-12-23: qty 30, 30d supply, fill #0

## 2020-12-23 MED ORDER — INSULIN ASPART PROT & ASPART (70-30 MIX) 100 UNIT/ML ~~LOC~~ SUSP
38.0000 [IU] | Freq: Two times a day (BID) | SUBCUTANEOUS | Status: DC
  Administered 2020-12-23 (×2): 38 [IU] via SUBCUTANEOUS

## 2020-12-23 MED ORDER — NOVOLIN 70/30 FLEXPEN RELION (70-30) 100 UNIT/ML ~~LOC~~ SUPN: 40.0000 [IU] | PEN_INJECTOR | Freq: Two times a day (BID) | SUBCUTANEOUS | 0 refills | Status: DC

## 2020-12-23 MED ORDER — NOVOLIN 70/30 FLEXPEN (70-30) 100 UNIT/ML ~~LOC~~ SUPN
PEN_INJECTOR | SUBCUTANEOUS | 0 refills | Status: DC
  Filled 2020-12-23: qty 6, 7d supply, fill #0
  Filled 2020-12-23: qty 15, 18d supply, fill #0
  Filled 2020-12-23: qty 9, 11d supply, fill #0

## 2020-12-23 MED ORDER — HEPARIN SOD (PORK) LOCK FLUSH 100 UNIT/ML IV SOLN
250.0000 [IU] | INTRAVENOUS | Status: DC | PRN
  Filled 2020-12-23: qty 2.5

## 2020-12-23 NOTE — Discharge Summary (Signed)
Physician Discharge Summary  EDNAH HAMMOCK QQI:[id number] DOB: Sep 10, 1980 DOA: 12/16/2020  PCP: Pcp, No  Admit date: 12/16/2020 Discharge date: 12/23/2020  Time spent: 55 minutes  Recommendations for Outpatient Follow-up:  Follow-up at [facility] to establish PCP on 01/12/2021.  On follow-up patient needs a basic metabolic profile done to follow-up on electrolytes and renal function.  Patient's blood pressure need to be reassessed and if further titration of Norvasc is needed may be done at that time.  Patient's diabetes will also need to be followed up upon with strict diabetes control. Follow-up with Dr. Roma Kayser, orthopedics in 1 to 2 weeks. Follow-up with Dr. West Bali as scheduled on 12/29/2020. Patient with discharge home with home health therapies.   Discharge Diagnoses:  Principal Problem:   Osteomyelitis of second toe of right foot ([place]) Active Problems:   Type 2 diabetes mellitus with hyperglycemia (HCC)   Class 3 obesity (HCC)   Hyperlipidemia   Osteomyelitis of right foot ([place])   Discharge Condition: Stable and improved.  Diet recommendation: Carb modified diet  Filed Weights   12/16/20 1358 12/18/20 1211  Weight: (!) 138.9 kg (!) 138.9 kg    History of present illness:  HPI per Dr. Sherryl Barters is a 40 y.o. female with medical history significant of class III obesity, hyperlipidemia, type 2 diabetes currently not on medical therapy who is coming from [facility] department where she presented with that week history of worsening chronic toe ulcer, which she has had for the past year plus, now coming with increased pain, development of purulent discharge and discoloration of second toe.  She does not remember having any trauma to the area.  No fever, chills, but feels fatigue.  No rhinorrhea, sore throat, wheezing, dyspnea or hemoptysis.  No chest pain, palpitations, diaphoresis, PND, orthopnea or  pitting edema of the lower extremities.  She has some nausea and vomiting earlier today.  No abdominal pain, diarrhea, constipation, melena or hematochezia.  No dysuria, frequency or hematuria.  She does not check her blood glucose regularly and has not been under treatment recently.  She denied polyuria, polydipsia, polyphagia or blurred vision.  She does not have a PCP currently.   ED Course: Initial vital signs were temperature 99.5 F, pulse 104, respiration 18, BP 114/67 mmHg O2 sat 95% on room air.   Lab work: CBC showed a white count 19.9, hemoglobin 12.7 g/dL and platelets 444.  CMP showed normal electrolytes when sodium is corrected to glucose.  Renal function was normal.  Glucose was 393 mg/dL.  Total protein was 8.6 and albumin 2.6 g/dL.  The rest of the hepatic functions were normal.   Imaging: Positive for osteomyelitis of the second toe distal and middle phalanges.  Distal phalanx is almost completely eroded.  Please see images and full radiology report for further details.  Hospital Course:  1 osteomyelitis of second toe of the right foot -MRI on admission with findings concerning for osteomyelitis of the phalanges and metatarsal of the second ray with soft tissue infection.  Abscess in dorsal flexor hallucis brevis and regional myositis and extensive cellulitis of forefoot and toes. -Patient seen in consultation by orthopedics, underwent ABIs. -Patient is status post right foot transmetatarsal amputation and I&D of deep abscess. -Cultures performed perioperatively with abundant Streptococcus agalactiae. -Patient being followed by orthopedics who recommended nonweightbearing to right lower extremity, extended course of IV antibiotics given extent of infection and poorly controlled diabetes, ID  recommendations per orthopedics, dressing changes. -ID consulted and recommended de-escalation of IV antibiotics to Rocephin and plan for 6 weeks of treatment from date of amputation. -ID also  recommending 2 weeks of oral Flagyl. -ID recommending PICC line placement (12/21/2020) with outpatient follow-up with ID in the outpatient setting. -PICC line ordered and placed 12/21/2020. -Increased Neurontin to 600 mg p.o. 3 times daily for better pain control. -Outpatient follow-up with orthopedics and ID. -PT/OT evaluated patient and recommending home health therapies as well as wheelchair, 3 and 1, rolling walker.    2.  Poorly controlled diabetes mellitus type 2 -Hemoglobin A1c 13.5 (12/16/2020) -Patient with no insurance, difficulty getting medications and as such patient started on 70/30. -70/30 was uptitrated for better blood glucose control and patient will be discharged on 40 units twice daily.   -Patient also maintained on Neurontin for pain control.   -Patient seen by diabetes coordinator during the hospitalization.   -Outpatient follow-up with PCP.   3.  Obesity -Lifestyle modification. -Outpatient follow-up with PCP.  4.  Hyperlipidemia -Fasting lipid panel with LDL of 58. -Outpatient follow-up.  5.  Hyponatremia -Urinalysis ordered and pending.   -Urine electrolytes pending.   -Urine osmolality pending.   -Improved with hydration.   -Outpatient follow-up.   6.  Hypertension/elevated blood pressure -Initially felt component of pain associated with elevated blood pressure.   -Blood pressure improved on Norvasc 5 mg daily which patient will be discharged out on.  -Outpatient follow-up.  7.  Constipation -Sorbitol p.o. every 2 hours x2 doses was given with good results. -Constipation had resolved by day of discharge.    Procedures: Plain films of the right foot 12/16/2020 Plain films of the right foot 12/17/2020 ABI 12/17/2020 Right foot transmetatarsal amputation/right foot incision and drainage of deep abscess per Dr. Kathaleen Bury, orthopedics 12/18/2020  Consultations: ID: Dr. West Bali 12/20/2020 Orthopedics: Dr.Ramanathan 12/17/2020  Discharge Exam: Vitals:    12/22/20 1339 12/23/20 0654  BP: (!) 143/77 122/77  Pulse: (!) 103 91  Resp: 18 18  Temp: 98.4 F (36.9 C) 98.9 F (37.2 C)  SpO2: 99% 99%    General: NAD. Cardiovascular: Regular rate rhythm no murmurs rubs or gallops.  No JVD.  No lower extremity edema. Respiratory: Lungs clear to auscultation bilaterally.  No wheezes, no crackles, no rhonchi.  Discharge Instructions   Discharge Instructions     [facility] pharmacist to adjust dose for Vancomycin, Aminoglycosides and other anti-infective therapies as requested by physician.   Complete by: As directed    [facility] to provide Cath Flo 70m   Complete by: As directed    Administer for PICC line occlusion and as ordered by physician for other access device issues.   Ambulatory referral to Physical Therapy   Complete by: As directed    Anaphylaxis Kit: Provided to treat any anaphylactic reaction to the medication being provided to the patient if First Dose or when requested by physician   Complete by: As directed    Epinephrine 130mml vial / amp: Administer 0.38m438m0.38ml28mubcutaneously once for moderate to severe anaphylaxis, nurse to call physician and pharmacy when reaction occurs and call 911 if needed for immediate care   Diphenhydramine 50mg61mIV vial: Administer 25-50mg 50mM PRN for first dose reaction, rash, itching, mild reaction, nurse to call physician and pharmacy when reaction occurs   Sodium Chloride 0.9% NS 500ml I42mdminister if needed for hypovolemic blood pressure drop or as ordered by physician after call to physician with anaphylactic  reaction   Change dressing on IV access line weekly and PRN   Complete by: As directed    Diet Carb Modified   Complete by: As directed    Discharge wound care:   Complete by: As directed    As above   Flush IV access with Sodium Chloride 0.9% and Heparin 10 units/ml or 100 units/ml   Complete by: As directed    Home infusion instructions - [facility]   Complete by: As directed    Instructions: Flush IV access with Sodium Chloride 0.9% and Heparin 10units/ml or 100units/ml   Change dressing on IV access line: Weekly and PRN   Instructions Cath Flo 79m: Administer for PICC Line occlusion and as ordered by physician for other access device   [facility] pharmacist to adjust dose for: Vancomycin, Aminoglycosides and other anti-infective therapies as requested by physician   Increase activity slowly   Complete by: As directed    Method of administration may be changed at the discretion of home infusion pharmacist based upon assessment of the patient and/or caregiver's ability to self-administer the medication ordered   Complete by: As directed       Allergies as of 12/23/2020       Reactions   Mangifera Indica Rash   FRESH MANGO        Medication List     TAKE these medications    acetaminophen 325 MG tablet Commonly known as: TYLENOL Take 2 tablets (650 mg total) by mouth every 6 (six) hours as needed for mild pain (or Fever >/= 101).   amLODipine 5 MG tablet Commonly known as: NORVASC Take 1 tablet (5 mg total) by mouth daily. Start taking on: December 24, 2020   cefTRIAXone  IVPB Commonly known as: ROCEPHIN Inject 2 g into the vein daily. Indication:  diabetic foot infection/osteomyelitis  First Dose: Yes Last Day of Therapy:  01/27/21 Labs - Once weekly:  CBC/D and BMP, Labs - Every other week:  ESR and CRP Method of administration: IV Push Method of administration may be changed at the discretion of home infusion pharmacist based upon assessment of the patient and/or caregiver's ability to self-administer the medication ordered.   gabapentin 300 MG capsule Commonly known as: NEURONTIN Take 2 capsules (600 mg total) by mouth 3 (three) times daily.   metroNIDAZOLE 500 MG tablet Commonly known as: FLAGYL Take 1 tablet (500 mg total) by mouth every 12 (twelve) hours for 14 days. Stop date  12/21   naproxen 250 MG tablet Commonly known as: NAPROSYN Take 250 mg by mouth 2 (two) times daily as needed for moderate pain.   NovoLIN 70/30 Kwikpen (70-30) 100 UNIT/ML KwikPen Generic drug: insulin isophane & regular human KwikPen Inject 40 Units into the skin 2 (two) times daily before a meal.   ondansetron 4 MG tablet Commonly known as: ZOFRAN Take 1 tablet (4 mg total) by mouth every 6 (six) hours as needed for nausea.   oxyCODONE 5 MG immediate release tablet Commonly known as: Oxy IR/ROXICODONE Take 1 tablet (5 mg total) by mouth every 3 (three) hours as needed for moderate pain.   Pen Needles 3/16" 31G X 5 MM Misc 40 Units by Does not apply route 2 (two) times daily before a meal.               Durable Medical Equipment  (From admission, onward)           Start  Ordered   12/22/20 1005  For home use only DME Walker rolling  Once       Comments: bariatric  Question Answer Comment  Walker: With [employer]   Patient needs a walker to treat with the following condition Osteomyelitis ([place])      12/22/20 1005   12/22/20 1005  For home use only DME standard manual wheelchair with seat cushion  Once       Comments: Patient suffers from osteomyelitis of the right foot status post transmetatarsal amputation which impairs their ability to perform daily activities like grooming in the home.  A cane will not resolve issue with performing activities of daily living. A wheelchair will allow patient to safely perform daily activities. Patient can safely propel the wheelchair in the home or has a caregiver who can provide assistance. Length of need 6 months . Accessories: elevating leg rests (ELRs), wheel locks, extensions and anti-tippers.  Bariatric   12/22/20 1005   12/21/20 1618  For home use only DME 3 n 1  Once       Comments: Bariatric.   12/21/20 1620              Discharge Care Instructions  (From admission, onward)           Start      Ordered   12/23/20 0000  Discharge wound care:       Comments: As above   12/23/20 1307   12/21/20 0000  Change dressing on IV access line weekly and PRN  (Home infusion instructions - [facility] )        12/21/20 1627           Allergies  Allergen Reactions   Mangifera Indica Rash    FRESH MANGO    Follow-up Information     [facility] Follow up on 01/12/2021.   Why: Your hospital follow up appointment is scheduled for January 12, 2021 at 9:30am with Juluis Mire. Contact information: [place] [id number] [phone]        [facility] Follow up.   Why: IV antibiotics. [facility] will provide HHRN.        Armond Hang, MD. Schedule an appointment as soon as possible for a visit in 1 week(s).   Specialty: Orthopedic Surgery Why: f/u in 1-2 weeks Contact information: [address], [address] [place] [address] [phone]         Rosiland Oz, MD Follow up on 12/29/2020.   Specialty: Infectious Diseases Why: f/u as scheduled at 915am Contact information: [address] [place] [address] [phone]                  The results of significant diagnostics from this hospitalization (including imaging, microbiology, ancillary and laboratory) are listed below for reference.    Significant Diagnostic Studies: MR FOOT RIGHT W WO CONTRAST  Result Date: 12/17/2020 CLINICAL DATA:  Osteomyelitis of the foot EXAM: MRI OF THE RIGHT FOREFOOT WITHOUT AND WITH CONTRAST TECHNIQUE: Multiplanar, multisequence MR imaging of the right forefoot was performed before and after the administration of intravenous contrast. CONTRAST:  10 cc Gadavist COMPARISON:  Radiographs 12/16/2020 FINDINGS: Bones/Joint/Cartilage Abnormal marrow edema compatible with osteomyelitis involving the phalanges of the second toe and the second metatarsal, extending in the  metatarsal back to the proximal metaphysis. There is probably a small erosion along the lateral margin of the proximal metaphysis of the second  metatarsal. Degenerative findings along the Lisfranc joint. No other compelling regions for osteomyelitis in the forefoot. Ligaments Lisfranc ligament unremarkable. Muscles and Tendons Abscess and myositis tracking in the flexor digitorum brevis muscle as shown previously. Low-grade edema and enhancement tracking along the interosseous muscles and the abductor hallucis, potentially reflecting mild myositis. Soft tissues There is gas in the soft tissues of the second toe with extensive edema and hypoenhancement compatible with necrotic tissue and soft tissue infection which tracks back along the base of the second toe and to a lesser extent in the dorsum of the foot adjacent to the second toe. There is also abnormal hypoenhancement of tissues along the plantar subcutaneous foot back from the second toe and extending along and proximally 11 cm excursion, necrotic tissue and soft tissue infection in this vicinity is not excluded and there is an associated subcutaneous fluid collection as shown on image 21 of series 7, with ill-defined margins, but measuring about 8 cc. Extensive subcutaneous edema circumferentially in the forefoot compatible with cellulitis. This extends into all of the toes. IMPRESSION: 1. Osteomyelitis of the phalanges and metatarsal of the second ray, with soft tissue infection in the second toe with locules of gas and hypoenhancement favoring necrotic tissue. Some of this extends back to the ball of the foot in the second toe, along with abnormal subcutaneous collection potentially reflecting abscess measuring about 8 cc in volume along the plantar foot. 2. Abscess in the dorsal flexor hallucis brevis muscle as noted previously, with questionable connectivity to the plantar subcutaneous fluid and debris collection. Regional myositis noted along with  extensive cellulitis of the forefoot and toes. Electronically Signed   By: Van Clines M.D.   On: 12/17/2020 13:07   MR ANKLE RIGHT W WO CONTRAST  Result Date: 12/17/2020 CLINICAL DATA:  Diabetes, foot infection, preoperative planning. EXAM: MRI OF THE RIGHT ANKLE WITHOUT AND WITH CONTRAST TECHNIQUE: Multiplanar, multisequence MR imaging of the ankle was performed before and after the administration of intravenous contrast. CONTRAST:  51m GADAVIST GADOBUTROL 1 MMOL/ML IV SOLN COMPARISON:  Radiographs 12/16/2020 FINDINGS: TENDONS Peroneal: Unremarkable Posteromedial: Moderate distal tibialis posterior tendinopathy. Anterior: Unremarkable Achilles: Unremarkable Plantar Fascia: No substantial thickening of the plantar fascia. LIGAMENTS Lateral: Unremarkable Medial: Unremarkable CARTILAGE Ankle Joint: 3 mm non-fragmented osteochondral lesion of the medial talar dome, image 21 series 4. Subtalar Joints/Sinus Tarsi: Unremarkable Bones: Marrow edema in the shaft of the second metatarsal compatible osteomyelitis based on the foot MRI appearance. Other: 1.1 by 0.7 by 6.4 cm (volume = 3 cm^3) fluid signal intensity collection with enhancing margins tracking within along the dorsal portion of the flexor digitorum brevis muscle as shown on image 29 series 10. Appearance compatible with abscess. There is abnormal edema and enhancement in the flexor digitorum brevis muscle favoring myositis, and low-level edema tracking in the abductor hallucis muscle potentially also from myositis. Dorsal subcutaneous edema and enhancement along the ankle favoring cellulitis. IMPRESSION: IMPRESSION 1. 3 cc abscess tracking within the dorsal portion of the flexor digitorum brevis muscle. Myositis involving the flexor digitorum brevis and abductor hallucis muscles. 2. Marrow edema in the shaft of the second metatarsal compatible with osteomyelitis based on the foot MRI appearance. 3. 3 mm non-fragmented osteochondral lesion of the  medial talar dome. 4. Moderate distal tibialis posterior tendinopathy. 5. Dorsal cellulitis along the ankle. Electronically Signed   By: WVan ClinesM.D.   On: 12/17/2020 13:00   DG Foot Complete Right  Result Date: 12/16/2020 CLINICAL DATA:  40year old female  with right foot infection, fever. Diabetes. EXAM: RIGHT FOOT COMPLETE - 3+ VIEW COMPARISON:  None. FINDINGS: Soft tissue swelling throughout the distal foot, maximal at the 2nd toe with gaping soft tissue ulceration or less likely soft tissue gas. Destruction of the 2nd distal phalanx, and the distal half of the middle phalanx. Destroyed 2nd D IP. Bone mineralization elsewhere in the right foot maintained. Extensive dystrophic calcifications in the soft tissues of the distal leg. IMPRESSION: Positive for Osteomyelitis of the 2nd toe distal AND middle phalanges. Distal phalanx almost completely eroded. Electronically Signed   By: Genevie Ann M.D.   On: 12/16/2020 04:17   VAS [place] ABI WITH/WO TBI  Result Date: 12/17/2020  LOWER EXTREMITY DOPPLER STUDY Patient Name:  LYNDY RUSSMAN  Date of Exam:   12/17/2020 Medical Rec #: [id number]       Accession #:    [id number] Date of Birth: Aug 06, 1980       Patient Gender: F Patient Age:   89 years Exam Location:  [facility] Procedure:      VAS [place] ABI WITH/WO TBI Referring Phys: Armond Hang --------------------------------------------------------------------------------  Indications: Ulceration, and osteomyelitis of the second toe distal and middle              phalanges. Distal phalanx is almost completely eroded. High Risk Factors: Diabetes. Other Factors: Not on medical therapy.  Limitations: Today's exam was limited due to bandages and tachycardia. Comparison Study: No prior study Performing Technologist: Darlin Coco  Examination Guidelines: A complete evaluation includes at minimum, Doppler waveform signals and systolic blood pressure reading at the level of bilateral brachial, anterior  tibial, and posterior tibial arteries, when vessel segments are accessible. Bilateral testing is considered an integral part of a complete examination. Photoelectric Plethysmograph (PPG) waveforms and toe systolic pressure readings are included as required and additional duplex testing as needed. Limited examinations for reoccurring indications may be performed as noted.  ABI Findings: +---------+------------------+-----+-----------+-------------------+ Right    Rt Pressure (mmHg)IndexWaveform   Comment             +---------+------------------+-----+-----------+-------------------+ Brachial 137                    multiphasic                    +---------+------------------+-----+-----------+-------------------+ PTA      156               1.11 multiphasic                    +---------+------------------+-----+-----------+-------------------+ DP       152               1.08 multiphasic                    +---------+------------------+-----+-----------+-------------------+ Great Toe                                  ulceration/bandages +---------+------------------+-----+-----------+-------------------+ +---------+------------------+-----+-----------+-------+ Left     Lt Pressure (mmHg)IndexWaveform   Comment +---------+------------------+-----+-----------+-------+ Brachial 141                    multiphasic        +---------+------------------+-----+-----------+-------+ PTA      155               1.10 multiphasic        +---------+------------------+-----+-----------+-------+ DP  159               1.13 multiphasic        +---------+------------------+-----+-----------+-------+ Great Toe126               0.89                    +---------+------------------+-----+-----------+-------+  Summary: Right: Resting right ankle-brachial index is within normal range. No evidence of significant right lower extremity arterial disease. Unable to attain ABI secondary  to ulceration and bandages. Left: Resting left ankle-brachial index is within normal range. No evidence of significant left lower extremity arterial disease. The left toe-brachial index is normal.  *See table(s) above for measurements and observations.  Electronically signed by Deitra Mayo MD on 12/17/2020 at 4:25:18 PM.    Final    [place] EKG SITE RITE  Result Date: 12/20/2020 If Site Rite image not attached, placement could not be confirmed due to current cardiac rhythm.   Microbiology: Recent Results (from the past 240 hour(s))  Blood culture (routine x 2)     Status: None   Collection Time: 12/16/20  4:15 AM   Specimen: BLOOD  Result Value Ref Range Status   Specimen Description   Final    BLOOD RIGHT ANTECUBITAL Performed at [facility], [address] [place], [place] [address]    Special Requests   Final    BOTTLES DRAWN AEROBIC AND ANAEROBIC Blood Culture results may not be optimal due to an inadequate volume of blood received in culture bottles Performed at [facility], [address] [place], [place] [address]    Culture   Final    NO GROWTH 5 DAYS Performed at [facility], [place] [address], [place], [place] [address]    Report Status 12/21/2020 FINAL  Final  Blood culture (routine x 2)     Status: None   Collection Time: 12/16/20  4:20 AM   Specimen: BLOOD  Result Value Ref Range Status   Specimen Description   Final    BLOOD RIGHT FOREARM Performed at [facility], [address] [place], [place] [address]    Special Requests   Final    BOTTLES DRAWN AEROBIC AND ANAEROBIC Blood Culture results may not be optimal due to an inadequate volume of blood received in culture bottles Performed at [facility], [address] [place], [place] [address]    Culture   Final    NO GROWTH 5 DAYS Performed at [facility], [place] [address] [place], [place] [address]    Report Status 12/21/2020 FINAL  Final  Resp  Panel by RT-PCR (Flu A&B, Covid) Nasopharyngeal Swab     Status: None   Collection Time: 12/16/20  5:20 AM   Specimen: Nasopharyngeal Swab; Nasopharyngeal(NP) swabs in vial transport medium  Result Value Ref Range Status   SARS Coronavirus 2 by RT PCR NEGATIVE NEGATIVE Final    Comment: (NOTE) SARS-CoV-2 target nucleic acids are NOT DETECTED.  The SARS-CoV-2 RNA is generally detectable in upper respiratory specimens during the acute phase of infection. The lowest concentration of SARS-CoV-2 viral copies this assay can detect is 138 copies/mL. A negative result does not preclude SARS-Cov-2 infection and should not be used as the sole basis for treatment or other patient management decisions. A negative result may occur with  improper specimen collection/handling, submission of specimen other than nasopharyngeal swab, presence of viral mutation(s)  within the areas targeted by this assay, and inadequate number of viral copies(<138 copies/mL). A negative result must be combined with clinical observations, patient history, and epidemiological information. The expected result is Negative.  Fact Sheet for Patients:  EntrepreneurPulse.com.au  Fact Sheet for Healthcare Providers:  IncredibleEmployment.be  This test is no t yet approved or cleared by the [place] FDA and  has been authorized for detection and/or diagnosis of SARS-CoV-2 by FDA under an Emergency Use Authorization (EUA). This EUA will remain  in effect (meaning this test can be used) for the duration of the COVID-19 declaration under Section 564(b)(1) of the Act, 21 U.S.C.section 360bbb-3(b)(1), unless the authorization is terminated  or revoked sooner.       Influenza A by PCR NEGATIVE NEGATIVE Final   Influenza B by PCR NEGATIVE NEGATIVE Final    Comment: (NOTE) The Xpert Xpress SARS-CoV-2/FLU/RSV plus assay is intended as an aid in the diagnosis of influenza from Nasopharyngeal  swab specimens and should not be used as a sole basis for treatment. Nasal washings and aspirates are unacceptable for Xpert Xpress SARS-CoV-2/FLU/RSV testing.  Fact Sheet for Patients: EntrepreneurPulse.com.au  Fact Sheet for Healthcare Providers: IncredibleEmployment.be  This test is not yet approved or cleared by the [place] FDA and has been authorized for detection and/or diagnosis of SARS-CoV-2 by FDA under an Emergency Use Authorization (EUA). This EUA will remain in effect (meaning this test can be used) for the duration of the COVID-19 declaration under Section 564(b)(1) of the Act, 21 U.S.C. section 360bbb-3(b)(1), unless the authorization is terminated or revoked.  Performed at [facility], [address] [place], [place] [address]   Surgical pcr screen     Status: None   Collection Time: 12/17/20  1:16 PM   Specimen: Nasal Mucosa; Nasal Swab  Result Value Ref Range Status   MRSA, PCR NEGATIVE NEGATIVE Final   Staphylococcus aureus NEGATIVE NEGATIVE Final    Comment: (NOTE) The Xpert SA Assay (FDA approved for NASAL specimens in patients 32 years of age and older), is one component of a comprehensive surveillance program. It is not intended to diagnose infection nor to guide or monitor treatment. Performed at [facility], [place] [address], [place], [place] [address]   Fungus Stain     Status: None   Collection Time: 12/18/20  4:12 PM   Specimen: PATH Other; Tissue  Result Value Ref Range Status   FUNGUS STAIN DUPLICATE  Final    Comment: (NOTE) Duplicate procedure ordered. Please refer to the following specimen for additional lab results.      [phone] Performed At: [facility] [place], [place] [id number] Rush Farmer MD IP:[id number]    Fungal Source TISSUE  Final    Comment: Performed at [facility], [place] [address] [place],  [place] [address]  Acid Fast Smear (AFB)     Status: None   Collection Time: 12/18/20  4:12 PM   Specimen: PATH Other; Tissue  Result Value Ref Range Status   AFB Specimen Processing Comment  Final    Comment: Tissue Grinding and Digestion/Decontamination   Acid Fast Smear Negative  Final    Comment: (NOTE) Performed At: [facility] [place], [place] [id number] Rush Farmer MD DZ:[id number]    Source (AFB) TISSUE  Final    Comment: Performed at [facility], [place] [address] [place], [place] [address]  Aerobic Culture w Gram Stain (superficial specimen)  Status: None   Collection Time: 12/18/20  4:12 PM   Specimen: Tissue  Result Value Ref Range Status   Specimen Description   Final    TISSUE Performed at [place] [address], [place], [place] [address]    Special Requests   Final    NONE Performed at [facility], [place] [address], [place], [place] [address]    Gram Stain   Final    ABUNDANT WBC PRESENT, PREDOMINANTLY MONONUCLEAR ABUNDANT GRAM POSITIVE COCCI FEW GRAM NEGATIVE RODS FEW GRAM POSITIVE RODS    Culture   Final    ABUNDANT STREPTOCOCCUS AGALACTIAE TESTING AGAINST S. AGALACTIAE NOT ROUTINELY PERFORMED DUE TO PREDICTABILITY OF AMP/PEN/VAN SUSCEPTIBILITY. Performed at [facility], [place] [address] [place], [place] [address]    Report Status 12/20/2020 FINAL  Final     Labs: Basic Metabolic Panel: Recent Labs  Lab 12/19/20 0445 12/20/20 0434 12/21/20 0444 12/22/20 0354 12/23/20 0308  NA 129* 128* 129* 130* 133*  K 4.2 4.2 4.7 4.6 4.3  CL 96* 92* 93* 92* 94*  CO2 22 25 25 30 29   GLUCOSE 362* 369* 332* 369* 303*  BUN 14 16 14 17  21*  CREATININE 0.81 0.70 0.73 0.78 0.76  CALCIUM 8.2* 8.4* 8.5* 8.7* 8.6*  MG  --   --  2.0  --   --    Liver Function Tests: Recent Labs  Lab 12/17/20 0425 12/19/20 0445 12/20/20 0434  AST 18 22 22   ALT 10 12 12   ALKPHOS 86 84 94   BILITOT 0.9 0.9 0.7  PROT 7.2 7.3 7.7  ALBUMIN 2.3* 2.1* 2.3*   No results for input(s): LIPASE, AMYLASE in the last 168 hours. No results for input(s): AMMONIA in the last 168 hours. CBC: Recent Labs  Lab 12/19/20 0445 12/20/20 0434 12/21/20 0444 12/22/20 0354 12/23/20 0308  WBC 19.8* 17.3* 15.5* 14.8* 13.4*  NEUTROABS  --   --  11.8* 11.5* 9.6*  HGB 10.8* 10.8* 11.4* 10.5* 10.2*  HCT 34.3* 34.8* 37.4 33.7* 32.9*  MCV 84.5 85.9 87.6 85.3 86.1  PLT 324 305 328 367 404*   Cardiac Enzymes: No results for input(s): CKTOTAL, CKMB, CKMBINDEX, TROPONINI in the last 168 hours. BNP: BNP (last 3 results) No results for input(s): BNP in the last 8760 hours.  ProBNP (last 3 results) No results for input(s): PROBNP in the last 8760 hours.  CBG: Recent Labs  Lab 12/22/20 1057 12/22/20 1858 12/22/20 2054 12/23/20 0724 12/23/20 1137  GLUCAP 219* 322* 318* 261* 240*       Signed:  Irine Seal MD.  [facility] 12/23/2020, 3:06 PM

## 2020-12-23 NOTE — Progress Notes (Signed)
Pt verbalized understanding of dc instructions including medications, follow up care, and when to call the doctor through teach back. Taught mother early this shift, around noon, how to change dressing w/verbalized understanding, Stated "that's easy". Answered all questions.

## 2020-12-23 NOTE — TOC Transition Note (Signed)
Transition of Care [facility]) - CM/SW Discharge Note   Patient Details  Name: [name] MRN: [id number] Date of Birth: February 13, 1980  Transition of Care [facility]) CM/SW Contact:  Ida Rogue, LCSW Phone Number: 12/23/2020, 3:05 PM   Clinical Narrative:   Patient who is stable for d/c is in need of ride home, [facility] voucher and med delivery.  PTAR arranged for ride home.  [facility] voucher secured and sent to [facility] outpatient pharmacy. TOC sign off.    Final next level of care: Home w Home Health Services Barriers to Discharge: Barriers Resolved   Patient Goals and CMS Choice Patient states their goals for this hospitalization and ongoing recovery are:: Return home CMS Medicare.gov Compare Post Acute Care list provided to:: Patient Choice offered to / list presented to : Patient  Discharge Placement                       Discharge Plan and Services In-house Referral: Clinical Social Work   Post Acute Care Choice: Home Health          DME Arranged: N/A DME Agency: NA       HH Arranged: IV Antibiotics, RN HH Agency: [facility] Date HH Agency Contacted: 12/20/20   Representative spoke with at [name] Agency: Pam  Social Determinants of Health (SDOH) Interventions     Readmission Risk Interventions No flowsheet data found.

## 2020-12-23 NOTE — Progress Notes (Signed)
Physical Therapy Treatment Patient Details Name: [name] MRN: [id number] DOB: [birth year] Today's Date: 12/23/2020   History of Present Illness 40 y.o. female with medical history significant of class III obesity, hyperlipidemia, type 2 diabetes currently not on medical therapy who is coming from [facility] emergency department where she presented with that week history of worsening chronic toe ulcer, which she has had for the past year plus, now coming with increased pain, development of purulent discharge and discoloration of second toe. Pt s/p transmet amputation.    PT Comments    Pt progressing with sit<>stand and pivot transfers, demonstrates compliance with NWB RLE. Pt will likely need non-emergent amb transport home as she cannot ascend 3-4 steps and maintain NWB. Pt does not have family that would be able to transport her up the steps in w/c.  DME as noted below, w/c will need ELRs     Recommendations for follow up therapy are one component of a multi-disciplinary discharge planning process, led by the attending physician.  Recommendations may be updated based on patient status, additional functional criteria and insurance authorization.  Follow Up Recommendations  Home health PT     Assistance Recommended at Discharge Intermittent Supervision/Assistance  Equipment Recommendations  Rolling walker (2 wheels);BSC/3in1;Wheelchair (measurements PT);Wheelchair cushion (measurements PT) (pt also requests knee scooter)    Recommendations for Other Services       Precautions / Restrictions Precautions Precautions: Fall Restrictions RLE Weight Bearing: Non weight bearing Other Position/Activity Restrictions: reviewed importance of NWB and educated on positioning, pain with dependent positioning     Mobility  Bed Mobility               General bed mobility comments: in recliner    Transfers Overall transfer level: Needs assistance Equipment used:  Rolling walker (2 wheels) Transfers: Sit to/from Stand;Bed to chair/wheelchair/BSC Sit to Stand: Min guard Stand pivot transfers: Min guard         General transfer comment: She is overall min guard with RW to pivot transfer to [facility] and recliner. no LOB with transfer. demonstrates good awareness of NWB and able to maintain during transfers    Ambulation/Gait               General Gait Details: small pivot steps only   Stairs             Wheelchair Mobility    Modified Rankin (Stroke Patients Only)       Balance   Sitting-balance support: No upper extremity supported Sitting balance-Leahy Scale: Normal Sitting balance - Comments: pt is able to wt shfit and perform her own peri-care in sitting-BSC   Standing balance support: Bilateral upper extremity supported Standing balance-Leahy Scale: Poor Standing balance comment: reliant on walker                            Cognition Arousal/Alertness: Awake/alert Behavior During Therapy: WFL for tasks assessed/performed Overall Cognitive Status: Within Functional Limits for tasks assessed                                          Exercises      General Comments        Pertinent Vitals/Pain Pain Assessment: Faces Faces Pain Scale: Hurts even more Pain Location: right foot - with dangling/standing Pain Descriptors / Indicators: Aching;Discomfort;Grimacing Pain  Intervention(s): Limited activity within patient's tolerance;Monitored during session;Repositioned    Home Living                          Prior Function            PT Goals (current goals can now be found in the care plan section) Acute Rehab PT Goals Patient Stated Goal: To walk PT Goal Formulation: With patient Time For Goal Achievement: 01/04/21 Potential to Achieve Goals: Good Progress towards PT goals: Progressing toward goals    Frequency    Min 5X/week      PT Plan Current plan remains  appropriate    Co-evaluation              AM-PAC PT "6 Clicks" Mobility   Outcome Measure  Help needed turning from your back to your side while in a flat bed without using bedrails?: None Help needed moving from lying on your back to sitting on the side of a flat bed without using bedrails?: None Help needed moving to and from a bed to a chair (including a wheelchair)?: A Little Help needed standing up from a chair using your arms (e.g., wheelchair or bedside chair)?: A Little Help needed to walk in hospital room?: Total Help needed climbing 3-5 steps with a railing? : Total 6 Click Score: 16    End of Session   Activity Tolerance: Patient tolerated treatment well Patient left: in chair;with call bell/phone within reach (alarm not set onarrival, pt in chair) Nurse Communication: Mobility status PT Visit Diagnosis: Unsteadiness on feet (R26.81);Difficulty in walking, not elsewhere classified (R26.2);Pain Pain - Right/Left: Right Pain - part of body: Ankle and joints of foot     Time: 1042-1100 PT Time Calculation (min) (ACUTE ONLY): 18 min  Charges:  $Therapeutic Activity: 8-22 mins                     Delice Bison, PT  Acute Rehab Dept ([facility]) [phone] Pager [phone]  12/23/2020    [facility] 12/23/2020, 11:06 AM

## 2020-12-24 LAB — GLUCOSE, CAPILLARY: Glucose-Capillary: 336 mg/dL — ABNORMAL HIGH (ref 70–99)

## 2020-12-24 MED ORDER — INSULIN ASPART PROT & ASPART (70-30 MIX) 100 UNIT/ML ~~LOC~~ SUSP
40.0000 [IU] | Freq: Two times a day (BID) | SUBCUTANEOUS | Status: DC
  Administered 2020-12-24: 40 [IU] via SUBCUTANEOUS
  Filled 2020-12-24: qty 10

## 2020-12-24 NOTE — Progress Notes (Signed)
Discharged via stretcher for transport home. Pt accompanied by paramedics. AVS given to pt yesterday. Reviewed dc instructions with pt who verbalized understanding. All personal items with pt upon dc.

## 2020-12-29 ENCOUNTER — Inpatient Hospital Stay: Payer: Self-pay | Admitting: Infectious Diseases

## 2020-12-29 ENCOUNTER — Other Ambulatory Visit (HOSPITAL_COMMUNITY): Payer: Self-pay

## 2021-01-02 ENCOUNTER — Other Ambulatory Visit: Payer: Self-pay

## 2021-01-02 ENCOUNTER — Telehealth: Payer: Self-pay | Admitting: Clinical

## 2021-01-02 ENCOUNTER — Ambulatory Visit (INDEPENDENT_AMBULATORY_CARE_PROVIDER_SITE_OTHER): Payer: Medicaid Other | Admitting: Infectious Diseases

## 2021-01-02 DIAGNOSIS — M869 Osteomyelitis, unspecified: Secondary | ICD-10-CM | POA: Diagnosis not present

## 2021-01-02 DIAGNOSIS — Z5181 Encounter for therapeutic drug level monitoring: Secondary | ICD-10-CM | POA: Insufficient documentation

## 2021-01-02 DIAGNOSIS — E08621 Diabetes mellitus due to underlying condition with foot ulcer: Secondary | ICD-10-CM

## 2021-01-02 DIAGNOSIS — L97519 Non-pressure chronic ulcer of other part of right foot with unspecified severity: Secondary | ICD-10-CM | POA: Diagnosis not present

## 2021-01-02 NOTE — Progress Notes (Addendum)
Virtual Visit via Telephone Note  I connected withNAME@ on 01/02/21 at  4:00 PM EST by a telephone enabled telemedicine application and verified that I am speaking with the correct person using two identifiers.  Location: Patient: Home  Provider: [facility]   I discussed the limitations of evaluation and management by telemedicine and the availability of in person appointments. The patient expressed understanding and agreed to proceed.  [place] for Infectious Disease  Patient Active Problem List   Diagnosis Date Noted   Osteomyelitis of right foot ([place])    Osteomyelitis of second toe of right foot ([place]) 12/16/2020   Type 2 diabetes mellitus with hyperglycemia ([place]) 12/16/2020   Class 3 obesity ([place]) 12/16/2020   Hyperlipidemia 12/16/2020   Soft tissue mass 09/15/2015    Patient's Medications  New Prescriptions   No medications on file  Previous Medications   ACETAMINOPHEN (TYLENOL) 325 MG TABLET    Take 2 tablets (650 mg total) by mouth every 6 (six) hours as needed for mild pain (or Fever >/= 101).   AMLODIPINE (NORVASC) 5 MG TABLET    Take 1 tablet (5 mg total) by mouth daily.   CEFTRIAXONE (ROCEPHIN) IVPB    Inject 2 g into the vein daily. Indication:  diabetic foot infection/osteomyelitis  First Dose: Yes Last Day of Therapy:  01/27/21 Labs - Once weekly:  CBC/D and BMP, Labs - Every other week:  ESR and CRP Method of administration: IV Push Method of administration may be changed at the discretion of home infusion pharmacist based upon assessment of the patient and/or caregiver's ability to self-administer the medication ordered.   GABAPENTIN (NEURONTIN) 300 MG CAPSULE    Take 2 capsules (600 mg total) by mouth 3 (three) times daily.   INSULIN ISOPHANE & REGULAR HUMAN KWIKPEN (NOVOLIN 70/30 KWIKPEN) (70-30) 100 UNIT/ML KWIKPEN    Inject 40 Units into the skin 2 (two) times daily before a meal.   INSULIN PEN NEEDLE (PEN NEEDLES 3/16") 31G X 5 MM MISC    40 Units by Does not  apply route 2 (two) times daily before a meal.   METRONIDAZOLE (FLAGYL) 500 MG TABLET    Take 1 tablet (500 mg total) by mouth every 12 (twelve) hours for 14 days. Stop date 12/21   NAPROXEN (NAPROSYN) 250 MG TABLET    Take 250 mg by mouth 2 (two) times daily as needed for moderate pain.   ONDANSETRON (ZOFRAN) 4 MG TABLET    Take 1 tablet (4 mg total) by mouth every 6 (six) hours as needed for nausea.   OXYCODONE (OXY IR/ROXICODONE) 5 MG IMMEDIATE RELEASE TABLET    Take 1 tablet (5 mg total) by mouth every 3 (three) hours as needed for moderate pain.  Modified Medications   No medications on file  Discontinued Medications   No medications on file    History of Present Illness: Here for HFU for DFU/osteomyelitis of rt foot. Doing well. Taking PO metronidazole twice a day. Getting IV ceftriaxone once a day. Had some nausea yesterday but no vomiting, abdominal pain, rashes and diarrhea. Also complains of soreness in the PICC line site but denies any redness/tenderness, purulence. PICC line site was seen by the [facility] nurse while dressing yesterday and there was no concern per patient. She has a fu appt with surgery next Tuesday. Discussed with patient about warning signs and symptoms to seek immediate attention regarding PICC line infection like excessive pain/tenderness, swelling or redness. I will also have [facility] nurse evaluate her  PICC line site for completeness. Discussed about continuing IV ceftriaxone until 1/14 and PO metronidazole for 14 days and fu in person in 3 weeks around end date of abtx- agrees to the plan   ROS 10 point ROS done with pertinent positives and negatives listed above   Past Medical History:  Diagnosis Date   Class 3 obesity ([place]) 12/16/2020   Diabetes mellitus     Social History   Tobacco Use   Smoking status: Never  Vaping Use   Vaping Use: Never used  Substance Use Topics   Alcohol use: Yes    Comment: occ   Drug use: Yes    Types: Marijuana    Family History   Problem Relation Age of Onset   Hypertension Mother    Diabetes Mother    Diabetes Other     Allergies  Allergen Reactions   Mangifera Indica Rash    [place] Maintenance  Topic Date Due   COVID-19 Vaccine (1) Never done   Pneumococcal Vaccine 81-66 Years old (1 - PCV) Never done   FOOT EXAM  Never done   OPHTHALMOLOGY EXAM  Never done   URINE MICROALBUMIN  Never done   Hepatitis C Screening  Never done   TETANUS/TDAP  Never done   PAP SMEAR-Modifier  Never done   INFLUENZA VACCINE  Never done   HEMOGLOBIN A1C  06/16/2021   HIV Screening  Completed   HPV VACCINES  Aged Out    Observations/Objective: Not examined ( phone visit)  Assessment and Plan: DFU/osteomyelitis of second ray including necrosis and abscess -S/p right foot transmetatarsal amputation, right foot I&D of deep abscess 12/5.  OR cultures Strep agalactiae - ABI -unremarkable for significant arterial disease  Medication Monitoring: CR 0.73, ESR 3 and CRP 139 Uncontrolled DM-A1c 13.5. Discussed about BG control  Obesity  Follow Up Instructions: Continue IV ceftriaxone as is . End date 01/27/21 Complete 14 days of PO Metronidazole Will have [facility] nurse evaluate PICC line site for any concerns Fu with surgery Fu with me in 3 weeks around end date of abtx 01/27/21 to DC abtx vs transition to PO  I discussed the assessment and treatment plan with the patient. The patient was provided an opportunity to ask questions and all were answered. The patient agreed with the plan and demonstrated an understanding of the instructions.   The patient was advised to call back or seek an in-person evaluation if the symptoms worsen or if the condition fails to improve as anticipated.  I provided 25 minutes of non-face-to-face time during this encounter.  Wilber Oliphant, [place] [facility] Phone [phone] Fax no. [phone]  01/02/2021, 9:00 AM

## 2021-01-02 NOTE — Telephone Encounter (Signed)
Integrated Behavioral Health Case Management Referral Note  01/02/2021 Name: CLASSIE WENG MRN: [id number] DOB: 1980-01-31 Rosana Hoes is a 40 y.o. year old female who sees Pcp, No for primary care. LCSW was consulted to assess patient's needs and assist the patient with Transportation Needs .  Interpreter: No.   Interpreter Name & Language: none  Assessment: Patient experiencing Transportation barriers.   Intervention: Patient discharged recently from the hospital after transmetatarsal amputation. She is in need of assistance with transportation to her hospital follow up appointment. She called to make an appointment with our clinic, but CSW noted she already has one at [facility]. She uses a wheelchair and has a step down from the porch outside her home to the ground. She would need assistance navigating the step. She has this follow up and other outpatient follow ups to attend. Consulting with [facility] care Presenter, broadcasting.   Review of patient status, including review of consultants reports, relevant laboratory and other test results, and collaboration with appropriate care team members and the patient's provider was performed as part of comprehensive patient evaluation and provision of services.    Abigail Butts, LCSW Patient Care Center [facility] [phone]

## 2021-01-02 NOTE — Addendum Note (Signed)
Addended by: Odette Fraction on: 01/02/2021 02:36 PM   Modules accepted: Level of Service

## 2021-01-03 ENCOUNTER — Telehealth: Payer: Self-pay | Admitting: Clinical

## 2021-01-03 NOTE — Telephone Encounter (Signed)
Integrated Behavioral Health General Follow Up Note  01/03/2021 Name: LANIA ZAWISTOWSKI MRN: [id number] DOB: [birth year] Rosana Hoes is a 40 y.o. year old female who sees Pcp, No for primary care. LCSW was consulted to assess patient's needs and assist the patient with Transportation Needs.  Interpreter: No.   Interpreter Name & Language: none  Assessment: Patient experiencing transportation barriers. Details described in yesterday's note. Continues to coordinate with patient regarding this issue today. Referred patient to [facility] RAM[facility] program to apply for assistance with a ramp for her home. She is working on the application.   Ongoing Intervention: Today CSW called patient to continue to coordinate on transportation needs for upcoming appointments.  Review of patient status, including review of consultants reports, relevant laboratory and other test results, and collaboration with appropriate care team members and the patient's provider was performed as part of comprehensive patient evaluation and provision of services.    Abigail Butts, LCSW [facility] [phone]

## 2021-01-11 ENCOUNTER — Telehealth: Payer: Self-pay | Admitting: Clinical

## 2021-01-11 NOTE — Telephone Encounter (Addendum)
Integrated Behavioral Health General Follow Up Note  01/11/2021 Name: [name] MRN: [id number] DOB: [birth year] [name] is a 40 y.o. year old female who sees Pcp, No for primary care. LCSW was consulted to assess patient's needs and assist the patient with Transportation Needs.  Interpreter: No.   Interpreter Name & Language: none  Assessment: Patient experiencing transportation barriers. She is in need of wheelchair transportation to her appointments.  Ongoing Intervention: CSW coordinated with patient on the phone today. Her family is assisting in obtaining a ramp to help her off her porch. CSW scheduled wheelchair transportation with [facility] transportation department for patient's hospital follow up at [facility] on 01/17/21.  Review of patient status, including review of consultants reports, relevant laboratory and other test results, and collaboration with appropriate care team members and the patient's provider was performed as part of comprehensive patient evaluation and provision of services.    Abigail Butts, LCSW Patient Care Center [facility] [phone]

## 2021-01-12 ENCOUNTER — Inpatient Hospital Stay (INDEPENDENT_AMBULATORY_CARE_PROVIDER_SITE_OTHER): Payer: Self-pay | Admitting: Primary Care

## 2021-01-16 ENCOUNTER — Telehealth: Payer: Self-pay | Admitting: Clinical

## 2021-01-16 NOTE — Telephone Encounter (Signed)
Integrated Behavioral Health General Follow Up Note  01/16/2021 Name: [name] WIECHMANN MRN: [id number] DOB: 1981/01/04 Derinda Late is a 41 y.o. year old female who sees Pcp, No for primary care. LCSW was consulted to assess patient's needs and assist the patient with Transportation Needs.  Interpreter: No.   Interpreter Name & Language: none  Assessment: Patient experiencing transportation barriers. She is in need of wheelchair transportation to her appointments.  Ongoing Intervention: CSW called patient to check in. Patient confirmed that she has wheelchair transportation through [facility] and does not need to use [employer] to her appointment tomorrow. Her family rented a ramp to help get her down from the porch. She is applying for a temporary ramp through [place] RAMPP program. [place] RAMPP program staff called CSW today and confirmed they are in contact with patient. Patient in need of assistance with affording her medications, possible [employer], and DME. Advised [facility] care coordinator and social worker of patient needs.  Review of patient status, including review of consultants reports, relevant laboratory and other test results, and collaboration with appropriate care team members and the patient's provider was performed as part of comprehensive patient evaluation and provision of services.    Estanislado Emms, [facility] [phone]

## 2021-01-17 ENCOUNTER — Other Ambulatory Visit: Payer: Self-pay

## 2021-01-17 ENCOUNTER — Encounter (INDEPENDENT_AMBULATORY_CARE_PROVIDER_SITE_OTHER): Payer: Self-pay | Admitting: Primary Care

## 2021-01-17 ENCOUNTER — Ambulatory Visit (INDEPENDENT_AMBULATORY_CARE_PROVIDER_SITE_OTHER): Payer: Medicaid Other | Admitting: Primary Care

## 2021-01-17 VITALS — BP 102/74 | HR 90 | Temp 97.3°F | Ht 69.0 in

## 2021-01-17 DIAGNOSIS — Z09 Encounter for follow-up examination after completed treatment for conditions other than malignant neoplasm: Secondary | ICD-10-CM

## 2021-01-17 DIAGNOSIS — Z6841 Body Mass Index (BMI) 40.0 and over, adult: Secondary | ICD-10-CM | POA: Diagnosis not present

## 2021-01-17 DIAGNOSIS — E1165 Type 2 diabetes mellitus with hyperglycemia: Secondary | ICD-10-CM | POA: Diagnosis not present

## 2021-01-17 DIAGNOSIS — I10 Essential (primary) hypertension: Secondary | ICD-10-CM | POA: Diagnosis not present

## 2021-01-17 LAB — GLUCOSE, POCT (MANUAL RESULT ENTRY): POC Glucose: 200 mg/dl — AB (ref 70–99)

## 2021-01-17 MED ORDER — LISINOPRIL 2.5 MG PO TABS
ORAL_TABLET | ORAL | 0 refills | Status: DC
  Filled 2021-01-17: qty 30, 30d supply, fill #0
  Filled 2021-02-17: qty 30, 30d supply, fill #1
  Filled 2021-02-19: qty 30, 30d supply, fill #0
  Filled 2021-03-18: qty 30, 30d supply, fill #1

## 2021-01-17 MED ORDER — LISINOPRIL 2.5 MG PO TABS
2.5000 mg | ORAL_TABLET | Freq: Every day | ORAL | 0 refills | Status: DC
  Filled 2021-01-17: qty 90, 90d supply, fill #0

## 2021-01-17 MED ORDER — METFORMIN HCL ER 500 MG PO TB24
500.0000 mg | ORAL_TABLET | Freq: Two times a day (BID) | ORAL | 1 refills | Status: DC
  Filled 2021-01-17: qty 180, 90d supply, fill #0

## 2021-01-17 MED ORDER — BASAGLAR KWIKPEN 100 UNIT/ML ~~LOC~~ SOPN
56.0000 [IU] | PEN_INJECTOR | Freq: Every day | SUBCUTANEOUS | 3 refills | Status: DC
  Filled 2021-01-17: qty 56, 100d supply, fill #0

## 2021-01-17 MED ORDER — BASAGLAR KWIKPEN 100 UNIT/ML ~~LOC~~ SOPN
PEN_INJECTOR | SUBCUTANEOUS | 3 refills | Status: DC
  Filled 2021-01-17: qty 15, 27d supply, fill #0

## 2021-01-17 MED ORDER — METFORMIN HCL ER 500 MG PO TB24
ORAL_TABLET | ORAL | 1 refills | Status: DC
  Filled 2021-01-17: qty 60, 30d supply, fill #0

## 2021-01-17 NOTE — Progress Notes (Signed)
Pt is not fasting 

## 2021-01-17 NOTE — Patient Instructions (Signed)
Diabetes Mellitus Action Plan °Following a diabetes action plan is a way for you to manage your diabetes (diabetes mellitus) symptoms. The plan is color-coded to help you understand what actions you need to take based on any symptoms you are having. °If you have symptoms in the red zone, you need medical care right away. °If you have symptoms in the yellow zone, you are having problems. °If you have symptoms in the green zone, you are doing well. °Learning about and understanding diabetes can take time. Follow the plan that you develop with your health care provider. Know the target range for your blood sugar (glucose) level, and review your treatment plan with your health care provider at each visit. °The target range for my blood sugar level is __________________________ mg/dL. °Red zone °Get medical help right away if you have any of the following symptoms: °A blood sugar test result that is below 54 mg/dL (3 mmol/L). °A blood sugar test result that is at or above 240 mg/dL (13.3 mmol/L) for 2 days in a row. °Confusion or trouble thinking clearly. °Difficulty breathing. °Sickness or a fever for 2 or more days that is not getting better. °Moderate or large ketone levels in your urine. °Feeling tired or having no energy. °If you have any red zone symptoms, do not wait to see if the symptoms will go away. Get medical help right away. Call your local emergency services (911 in the [place] Do not drive yourself to the hospital. °If you have severely low blood sugar (severe hypoglycemia) and you cannot eat or drink, you may need glucagon. Make sure a family member or close friend knows how to check your blood sugar and how to give you glucagon. You may need to be treated in a hospital for this condition. °Yellow zone °If you have any of the following symptoms, your diabetes is not under control and you may need to make some changes: °A blood sugar test result that is at or above 240 mg/dL (13.3 mmol/L) for 2 days in a  row. °Blood sugar test results that are below 70 mg/dL (3.9 mmol/L). °Other symptoms of hypoglycemia, such as: °Shaking or feeling light-headed. °Confusion or irritability. °Feeling hungry. °Having a fast heartbeat. °If you have any yellow zone symptoms: °Treat your hypoglycemia by eating or drinking 15 grams of a rapid-acting carbohydrate. Follow the 15:15 rule: °Take 15 grams of a rapid-acting carbohydrate, such as: °1 tube of glucose gel. °4 glucose pills. °4 oz (120 mL) of fruit juice. °4 oz (120 mL) of regular (not diet) soda. °Check your blood sugar 15 minutes after you take the carbohydrate. °If the repeat blood sugar test is still at or below 70 mg/dL (3.9 mmol/L), take 15 grams of a carbohydrate again. °If your blood sugar does not increase above 70 mg/dL (3.9 mmol/L) after 3 tries, get medical help right away. °After your blood sugar returns to normal, eat a meal or a snack within 1 hour. °Keep taking your daily medicines as told by your health care provider. °Check your blood sugar more often than you normally would. °Write down your results. °Call your health care provider if you have trouble keeping your blood sugar in your target range. ° °Green zone °These signs mean you are doing well and you can continue what you are doing to manage your diabetes: °Your blood sugar is within your personal target range. For most people, a blood sugar level before a meal (preprandial) should be 80-130 mg/dL (4.4-7.2 mmol/L). °You feel   well, and you are able to do daily activities. °If you are in the green zone, continue to manage your diabetes as told by your health care provider. To do this: °Eat a healthy diet. °Exercise regularly. °Check your blood sugar as told by your health care provider. °Take your medicines as told by your health care provider. ° °Where to find more information °American Diabetes Association (ADA): diabetes.org °Association of Diabetes Care & Education Specialists (ADCES):  diabeteseducator.org °Summary °Following a diabetes action plan is a way for you to manage your diabetes symptoms. The plan is color-coded to help you understand what actions you need to take based on any symptoms you are having. °Follow the plan that you develop with your health care provider. Make sure you know your personal target blood sugar level. °Review your treatment plan with your health care provider at each visit. °This information is not intended to replace advice given to you by your health care provider. Make sure you discuss any questions you have with your health care provider. °Document Revised: 07/08/2019 Document Reviewed: 07/08/2019 °Elsevier Patient Education © 2022 Elsevier Inc. ° °

## 2021-01-17 NOTE — Progress Notes (Signed)
[facility]   Subjective:   [name] is a 41 y.o. female presents for hospital follow up and establish care. Admit date to the hospital was 12/16/20, patient was discharged from the hospital on 12/24/20, patient was admitted for: Osteomyelitis of second toe of right foot ,Type 2 diabetes mellitus with hyperglycemia ,Class 3 obesity ,Hyperlipidemia and Osteomyelitis of right foot .  Patient has not been seen by provider for at least 5 years.  She had a sore on her right foot and tried to manage it at home. Care    Past Medical History:  Diagnosis Date   Class 3 obesity ([place]) 12/16/2020   Diabetes mellitus      Allergies  Allergen Reactions   Mangifera Indica Rash    FRESH MANGO      Current Outpatient Medications on File Prior to Visit  Medication Sig Dispense Refill   acetaminophen (TYLENOL) 325 MG tablet Take 2 tablets (650 mg total) by mouth every 6 (six) hours as needed for mild pain (or Fever >/= 101).     cefTRIAXone (ROCEPHIN) IVPB Inject 2 g into the vein daily. Indication:  diabetic foot infection/osteomyelitis  First Dose: Yes Last Day of Therapy:  01/27/21 Labs - Once weekly:  CBC/D and BMP, Labs - Every other week:  ESR and CRP Method of administration: IV Push Method of administration may be changed at the discretion of home infusion pharmacist based upon assessment of the patient and/or caregiver's ability to self-administer the medication ordered. 38 Units 0   gabapentin (NEURONTIN) 300 MG capsule Take 2 capsules (600 mg total) by mouth 3 (three) times daily. 180 capsule 1   Insulin Pen Needle (PEN NEEDLES 3/16") 31G X 5 MM MISC 40 Units by Does not apply route 2 (two) times daily before a meal. 100 each 0   ondansetron (ZOFRAN) 4 MG tablet Take 1 tablet (4 mg total) by mouth every 6 (six) hours as needed for nausea. 20 tablet 0   naproxen (NAPROSYN) 250 MG tablet Take 250 mg by mouth 2 (two) times daily as needed for moderate pain. (Patient not  taking: Reported on 01/17/2021)     oxyCODONE (OXY IR/ROXICODONE) 5 MG immediate release tablet Take 1 tablet (5 mg total) by mouth every 3 (three) hours as needed for moderate pain. (Patient not taking: Reported on 01/17/2021) 15 tablet 0   No current facility-administered medications on file prior to visit.     Review of System: ROS  Objective:  BP 102/74 (BP Location: Left Arm, Patient Position: Sitting, Cuff Size: Large)    Pulse 90    Temp (!) 97.3 F (36.3 C) (Temporal)    Ht 5' 9"  (1.753 m)    LMP 01/16/2021 (Approximate)    SpO2 98%    BMI 45.22 kg/m   Filed Weights    Physical Exam:   General Appearance: Well nourished, in no apparent distress. Eyes: PERRLA, EOMs, conjunctiva no swelling or erythema Sinuses: No Frontal/maxillary tenderness ENT/Mouth: Ext aud canals clear, TMs without erythema, bulging. No erythema, swelling, or exudate on post pharynx.  Tonsils not swollen or erythematous. Hearing normal.  Neck: Supple, thyroid normal.  Respiratory: Respiratory effort normal, BS equal bilaterally without rales, rhonchi, wheezing or stridor.  Cardio: RRR with no MRGs. Brisk peripheral pulses without edema.  Abdomen: Soft, + BS.  Non tender, no guarding, rebound, hernias, masses. Lymphatics: Non tender without lymphadenopathy.  Musculoskeletal: Full ROM, 5/5 strength, normal gait.  Skin: Warm, dry without rashes, lesions, ecchymosis.  Neuro: Cranial nerves intact. Normal muscle tone, no cerebellar symptoms. Sensation intact.  Psych: Awake and oriented X 3, normal affect, Insight and Judgment appropriate.    Assessment:   1. Type 2 diabetes mellitus with hyperglycemia, unspecified whether long term insulin use ([place])   2. Hospital discharge follow-up   3. Class 3 obesity (HCC)   4. Essential hypertension     Meds ordered this encounter  Medications   DISCONTD: lisinopril (ZESTRIL) 2.5 MG tablet    Sig: Take 1 tablet (2.5 mg total) by mouth daily.    Dispense:  90  tablet    Refill:  0   DISCONTD: Insulin Glargine (BASAGLAR KWIKPEN) 100 UNIT/ML    Sig: Inject 56 Units into the skin daily.    Dispense:  56 mL    Refill:  3   DISCONTD: metFORMIN (GLUCOPHAGE XR) 500 MG 24 hr tablet    Sig: Take 1 tablet (500 mg total) by mouth 2 (two) times daily.    Dispense:  180 tablet    Refill:  1    This note has been created with Surveyor, quantity. Any transcriptional errors are unintentional.   Kerin Perna, NP 01/17/2021, 7:44 PM

## 2021-01-17 NOTE — Progress Notes (Signed)
[facility]   Subjective:  [name] is a 41 y.o. female presents for hospital follow up and establish care. Admit date to the hospital was 12/16/20, patient was discharged from the hospital on 12/24/20, patient was admitted for: Osteomyelitis of second toe of right foot ,Type 2 diabetes mellitus with hyperglycemia ,Class 3 obesity ,Hyperlipidemia and Osteomyelitis of right foot .She has not been followed by provider in over 5 years. She presented to [place] emergency department where she presented with that week history of worsening chronic toe ulcer, which she has had for the past year plus, now coming with increased pain, development of purulent discharge and discoloration of second toe.  She does not remember having any trauma to the area.  No fever, chills, but feels fatigue.  No rhinorrhea, sore throat, wheezing, dyspnea or hemoptysis.  A1c A1c was greater than 15.  Today she presents for management of type 2 diabetes, hypertension, hyperlipidemia and severe morbid obesity.  She had a right ray amputation-dressing is clean and dry with no serosanguineous drainage or wetness.  She is in good spirits, had already prepared herself to lose more than her toes.Denies shortness of breath, headaches, chest pain or lower extremity edema .  Past Medical History:  Diagnosis Date   Class 3 obesity ([place]) 12/16/2020   Diabetes mellitus      Allergies  Allergen Reactions   Mangifera Indica Rash    FRESH MANGO      Current Outpatient Medications on File Prior to Visit  Medication Sig Dispense Refill   acetaminophen (TYLENOL) 325 MG tablet Take 2 tablets (650 mg total) by mouth every 6 (six) hours as needed for mild pain (or Fever >/= 101).     amLODipine (NORVASC) 5 MG tablet Take 1 tablet (5 mg total) by mouth daily. 30 tablet 1   cefTRIAXone (ROCEPHIN) IVPB Inject 2 g into the vein daily. Indication:  diabetic foot infection/osteomyelitis  First  Dose: Yes Last Day of Therapy:  01/27/21 Labs - Once weekly:  CBC/D and BMP, Labs - Every other week:  ESR and CRP Method of administration: IV Push Method of administration may be changed at the discretion of home infusion pharmacist based upon assessment of the patient and/or caregiver's ability to self-administer the medication ordered. 38 Units 0   gabapentin (NEURONTIN) 300 MG capsule Take 2 capsules (600 mg total) by mouth 3 (three) times daily. 180 capsule 1   insulin isophane & regular human KwikPen (NOVOLIN 70/30 KWIKPEN) (70-30) 100 UNIT/ML KwikPen Inject 40 Units into the skin 2 (two) times daily before a meal. 15 mL 0   Insulin Pen Needle (PEN NEEDLES 3/16") 31G X 5 MM MISC 40 Units by Does not apply route 2 (two) times daily before a meal. 100 each 0   ondansetron (ZOFRAN) 4 MG tablet Take 1 tablet (4 mg total) by mouth every 6 (six) hours as needed for nausea. 20 tablet 0   naproxen (NAPROSYN) 250 MG tablet Take 250 mg by mouth 2 (two) times daily as needed for moderate pain. (Patient not taking: Reported on 01/17/2021)     oxyCODONE (OXY IR/ROXICODONE) 5 MG immediate release tablet Take 1 tablet (5 mg total) by mouth every 3 (three) hours as needed for moderate pain. (Patient not taking: Reported on 01/17/2021) 15 tablet 0   No current facility-administered medications on file prior to visit.   Review of System: Comprehensive review of system pertinent positives and negatives noted in HPI  Objective:  BP 102/74 (BP Location: Left Arm, Patient Position: Sitting, Cuff Size: Large)    Pulse 90    Temp (!) 97.3 F (36.3 C) (Temporal)    Ht 5' 9" (1.753 m)    LMP 01/16/2021 (Approximate)    SpO2 98%    BMI 45.22 kg/m   There were no vitals filed for this visit.  Physical Exam: General Appearance: Well nourished, severe morbid obese female in no apparent distress. Eyes: PERRLA, EOMs, conjunctiva no swelling or erythema Sinuses: No Frontal/maxillary tenderness ENT/Mouth: Ext aud  canals clear, TMs without erythema, bulging. Hearing normal.  Neck: Supple, thick, thyroid normal.  Respiratory: Respiratory effort normal, BS equal bilaterally without rales, rhonchi, wheezing or stridor.  Cardio: RRR with no MRGs. Brisk peripheral pulses without edema.  Abdomen: Soft, + BS.  Non tender, no guarding, rebound, hernias, masses. Lymphatics: Non tender without lymphadenopathy.  Musculoskeletal: Presented in a wheelchair, abnormal gait.  Skin: Warm, dry without rashes, lesions, ecchymosis.  Neuro: Cranial nerves intact. Normal muscle tone, no cerebellar symptoms. Sensation intact.  Psych: Awake and oriented X 3, normal affect, Insight and Judgment appropriate.    Assessment:  [name] was seen today for hospitalization follow-up.  Diagnoses and all orders for this visit:  Type 2 diabetes mellitus with hyperglycemia, unspecified whether long term insulin use (HCC) -     Glucose (CBG) -     Microalbumin/Creatinine Ratio, Urine A1c is unacceptable and out-of-control medication adjusted discontinue 70/30 insulin   started on Basaglar 56 units daily, metformin 500 XR twice daily.  Discussed monitoring foods that are high in carbohydrates are the following rice, potatoes, breads, sugars, and pastas.  Reduction in the intake (eating) will assist in lowering your blood sugars.  Follow-up with clinical pharmacist first available appointment  Hospital discharge follow-up Retrieved from hospital discharge Recommendations for Outpatient Follow-up:  Follow-up at [facility] to establish PCP on 01/12/2021.  On follow-up patient needs a basic metabolic profile done to follow-up on electrolytes and renal function.  Patient's blood pressure need to be reassessed and if further titration of Norvasc is needed may be done at that time.  Patient's diabetes will also need to be followed up upon with strict diabetes control.  Appointment was rescheduled and seen on  01/17/2021 Follow-up with Dr. Roma Kayser, orthopedics in 1 to 2 weeks. Follow-up with Dr. West Bali as scheduled on 12/29/2020. Patient with discharge home with home health therapies. -     [id number]+EGFR  Class 3 obesity (HCC) Severe morbid obesity is BMI greater than 40 indicating an excess in caloric intake or underlining conditions. This has lead to other co-morbidities. Lifestyle modifications of diet and exercise may reduce obesity.    Essential hypertension Blood pressure is unremarkable will place on a ACE for renal protection 2.5 lisinopril.  Discussed potential side effects -     [id number]+EGFR  Take 1 tablet (2.5 mg total) by mouth daily.    This note has been created with Surveyor, quantity. Any transcriptional errors are unintentional.   Kerin Perna, NP 01/17/2021, 9:49 AM

## 2021-01-18 ENCOUNTER — Telehealth: Payer: Self-pay | Admitting: Clinical

## 2021-01-18 LAB — CMP14+EGFR
ALT: 8 IU/L (ref 0–32)
AST: 15 IU/L (ref 0–40)
Albumin/Globulin Ratio: 1.2 (ref 1.2–2.2)
Albumin: 4 g/dL (ref 3.8–4.8)
Alkaline Phosphatase: 115 IU/L (ref 44–121)
BUN/Creatinine Ratio: 12 (ref 9–23)
BUN: 10 mg/dL (ref 6–20)
Bilirubin Total: 0.3 mg/dL (ref 0.0–1.2)
CO2: 20 mmol/L (ref 20–29)
Calcium: 9.7 mg/dL (ref 8.7–10.2)
Chloride: 98 mmol/L (ref 96–106)
Creatinine, Ser: 0.83 mg/dL (ref 0.57–1.00)
Globulin, Total: 3.3 g/dL (ref 1.5–4.5)
Glucose: 199 mg/dL — ABNORMAL HIGH (ref 70–99)
Potassium: 4.5 mmol/L (ref 3.5–5.2)
Sodium: 137 mmol/L (ref 134–144)
Total Protein: 7.3 g/dL (ref 6.0–8.5)
eGFR: 92 mL/min/{1.73_m2} (ref >59)

## 2021-01-18 LAB — MICROALBUMIN / CREATININE URINE RATIO
Creatinine, Urine: 224.7 mg/dL
Microalb/Creat Ratio: 8 mg/g creat (ref 0–29)
Microalbumin, Urine: 17 ug/mL

## 2021-01-18 NOTE — Telephone Encounter (Signed)
[facility] General Follow Up Note  01/18/2021 Name: [name] MRN: [id number] DOB: 18-Jan-1980 [name] is a 41 y.o. year old female who sees Grayce Sessions, NP for primary care. LCSW was consulted to assess patient's needs and assist the patient with Transportation Needs.  Interpreter: No.   Interpreter Name & Language: none  Assessment: Patient experiencing transportation barriers. She is in need of wheelchair transportation to her appointments.  Ongoing Intervention: Patient called CSW to follow up on her appointments this week. She attended her surgery follow up and established care with PCP at [facility]. She used [facility] transportation. She is using a wheelchair through a home health/DME company that was covered for a short term after her hospital discharge but they will start charging her for the use of it on 01/23/21. She cannot afford the charges. She needs assistance with another wheelchair to use, as she will need her wheelchair when leaving the home for other follow up appointments. Have referred patient to care management coordinator and social worker for [facility] for these needs.  Review of patient status, including review of consultants reports, relevant laboratory and other test results, and collaboration with appropriate care team members and the patient's provider was performed as part of comprehensive patient evaluation and provision of services.    Abigail Butts, LCSW [facility] [phone]

## 2021-01-19 ENCOUNTER — Telehealth (INDEPENDENT_AMBULATORY_CARE_PROVIDER_SITE_OTHER): Payer: Self-pay

## 2021-01-19 LAB — FUNGUS CULTURE WITH STAIN

## 2021-01-19 LAB — FUNGAL ORGANISM REFLEX

## 2021-01-19 LAB — FUNGUS CULTURE RESULT

## 2021-01-19 NOTE — Telephone Encounter (Signed)
Patient is aware of normal labs and microalbumin. She did ask if she was supposed to stop taking amlodipine. CMA unable to answer as it is not mentioned in note from office visit. Please advise if patient is to discontinue amlodipine. Nat Christen, CMA

## 2021-01-19 NOTE — Telephone Encounter (Signed)
-----   Message from Grayce Sessions, NP sent at 01/19/2021 12:48 PM EST ----- Labs and micro albumin are normal

## 2021-01-21 NOTE — Telephone Encounter (Signed)
Call patient to inform amlodipine d/c and take lisinopril 2.5mg  daily

## 2021-01-22 ENCOUNTER — Encounter (HOSPITAL_BASED_OUTPATIENT_CLINIC_OR_DEPARTMENT_OTHER): Payer: Medicaid Other | Attending: Internal Medicine | Admitting: Internal Medicine

## 2021-01-22 ENCOUNTER — Other Ambulatory Visit: Payer: Self-pay

## 2021-01-22 ENCOUNTER — Telehealth: Payer: Self-pay

## 2021-01-22 DIAGNOSIS — L97514 Non-pressure chronic ulcer of other part of right foot with necrosis of bone: Secondary | ICD-10-CM | POA: Diagnosis not present

## 2021-01-22 DIAGNOSIS — M869 Osteomyelitis, unspecified: Secondary | ICD-10-CM | POA: Diagnosis not present

## 2021-01-22 DIAGNOSIS — E11621 Type 2 diabetes mellitus with foot ulcer: Secondary | ICD-10-CM | POA: Insufficient documentation

## 2021-01-22 DIAGNOSIS — E1140 Type 2 diabetes mellitus with diabetic neuropathy, unspecified: Secondary | ICD-10-CM | POA: Insufficient documentation

## 2021-01-22 DIAGNOSIS — I10 Essential (primary) hypertension: Secondary | ICD-10-CM | POA: Insufficient documentation

## 2021-01-22 NOTE — Progress Notes (Signed)
[name], [name] ([id number]) Visit Report for 01/22/2021 Chief Complaint Document Details Patient Name: Date of Service: [name], [place] [name] 01/22/2021 7:30 A M Medical Record Number: [id number] Patient Account Number: [id number] Date of Birth/Sex: Treating RN: 11/01/1980 (41 y.o. [name] Primary Care Provider: Gwinda Passe Other Clinician: Referring Provider: Treating Provider/Extender: Grace Isaac in Treatment: 0 Information Obtained from: Patient Chief Complaint Osteomyelitis of the right foot status post transmetatarsal amputation with surgical site dehiscence Electronic Signature(s) Signed: 01/22/2021 9:12:38 AM By: Geralyn Corwin DO Entered By: Geralyn Corwin on 01/22/2021 09:01:00 -------------------------------------------------------------------------------- Debridement Details Patient Name: Date of Service: [name], [name] 01/22/2021 7:30 A M Medical Record Number: [id number] Patient Account Number: [id number] Date of Birth/Sex: Treating RN: August 28, 1980 (41 y.o. [name] Primary Care Provider: Gwinda Passe Other Clinician: Referring Provider: Treating Provider/Extender: Grace Isaac in Treatment: 0 Debridement Performed for Assessment: Wound #1 Right Amputation Site - Transmetatarsal Performed By: Physician Geralyn Corwin, DO Debridement Type: Debridement Level of Consciousness (Pre-procedure): Awake and Alert Pre-procedure Verification/Time Out Yes - 08:41 Taken: Start Time: 08:42 Pain Control: Other : Benzocaine T Area Debrided (L x W): otal 3 (cm) x 4 (cm) = 12 (cm) Tissue and other material debrided: Non-Viable, Slough, Skin: Dermis , Slough Level: Skin/Dermis Debridement Description: Selective/Open Wound Instrument: Forceps, Scissors Bleeding: Minimum Hemostasis Achieved: Pressure End Time: 08:46 Response to Treatment: Procedure was tolerated well Level of  Consciousness (Post- Awake and Alert procedure): Post Debridement Measurements of Total Wound Length: (cm) 4.3 Width: (cm) 12.6 Depth: (cm) 1.6 Volume: (cm) 68.085 Character of Wound/Ulcer Post Debridement: Stable Post Procedure Diagnosis Same as Pre-procedure Electronic Signature(s) Signed: 01/22/2021 9:12:38 AM By: Geralyn Corwin DO Signed: 01/22/2021 4:35:23 PM By: Antonieta Iba Entered By: Antonieta Iba on 01/22/2021 08:46:39 -------------------------------------------------------------------------------- HPI Details Patient Name: Date of Service: [name], [name] 01/22/2021 7:30 A M Medical Record Number: [id number] Patient Account Number: [id number] Date of Birth/Sex: Treating RN: 1980-12-04 (41 y.o. [name] Primary Care Provider: Gwinda Passe Other Clinician: Referring Provider: Treating Provider/Extender: Grace Isaac in Treatment: 0 History of Present Illness HPI Description: Admission 01/22/2021 [name] a 41 year old female with a past medical history of insulin-dependent uncontrolled type 2 diabetes with last hemoglobin A1c of 13.5, osteomyelitis of the right foot status post transmetatarsal amputation on 12/18/2020 that presents to the clinic for right foot wound. She has had dehiscence of the surgical site. [name] using wet-to-dry dressings. She has a PICC line and receiving IV ceftriaxone daily for her osteomyelitis. There [name] an end date of 01/27/2021. [name] also taking oral metronidazole. She currently denies systemic signs of infection. Electronic Signature(s) Signed: 01/22/2021 9:12:38 AM By: Geralyn Corwin DO Entered By: Geralyn Corwin on 01/22/2021 09:04:15 -------------------------------------------------------------------------------- Physical Exam Details Patient Name: Date of Service: [name], [name] 01/22/2021 7:30 A M Medical Record Number: [id number] Patient Account Number:  [id number] Date of Birth/Sex: Treating RN: [birth year] (42 y.o. [name] Primary Care Provider: Gwinda Passe Other Clinician: Referring Provider: Treating Provider/Extender: Tobie Poet Weeks in Treatment: 0 Constitutional respirations regular, non-labored and within target range for patient.. Cardiovascular 2+ dorsalis pedis/posterior tibialis pulses. Psychiatric pleasant and cooperative. Notes Right foot: Transmetatarsal surgical wound site has dehisced at the first and second metatarsal site. The fifth metatarsal site appears to start dehiscing as well. There [name] probing to bone. There [name] also nonviable tissue with some  granulation tissue at the wound bed. No purulent drainage. No increased warmth or erythema to the foot. Electronic Signature(s) Signed: 01/22/2021 9:12:38 AM By: Geralyn CorwinHoffman, Trafton Roker DO Entered By: Geralyn CorwinHoffman, Lanett Lasorsa on 01/22/2021 09:05:43 -------------------------------------------------------------------------------- Physician Orders Details Patient Name: Date of Service: [name], [name] 01/22/2021 7:30 A M Medical Record Number: [id number] Patient Account Number: [id number] Date of Birth/Sex: Treating RN: [birth year] (41 y.o. Roel CluckF) Barnhart, Jodi Primary Care Provider: Gwinda PasseEdwards, Michelle Other Clinician: Referring Provider: Treating Provider/Extender: Grace IsaacHoffman, Nashalie Sallis Edwards, Michelle Weeks in Treatment: 0 Verbal / Phone Orders: No Diagnosis Coding Follow-up Appointments ppointment in 1 week. - with Dr. Mikey BussingHoffman Return A Bathing/ Shower/ Hygiene Other Bathing/Shower/Hygiene Orders/Instructions: - Clean with saline or Dakins Edema Control - Lymphedema / SCD / Other Avoid standing for long periods of time. Moisturize legs daily. - May use Vaseline or Aquaphor Lotion Additional Orders / Instructions Follow Nutritious Diet - -Monitor/Control Blood Sugar -High Protein Diet Wound Treatment Wound #1 - Amputation Site -  Transmetatarsal Wound Laterality: Right Cleanser: Normal Saline 1 x Per Day/30 Days Discharge Instructions: Cleanse the wound with Normal Saline prior to applying a clean dressing using gauze sponges, not tissue or cotton balls. Topical: Dakin's solution 1 x Per Day/30 Days Discharge Instructions: Moisten Gauze with Dakins Secondary Dressing: Woven Gauze Sponge, Non-Sterile 4x4 in 1 x Per Day/30 Days Discharge Instructions: Apply over primary dressing as directed. Secondary Dressing: ABD Pad, 5x9 1 x Per Day/30 Days Discharge Instructions: Apply over primary dressing as directed. Secured With: [employer] GroupKerlix Roll Sterile, 4.5x3.1 (in/yd) 1 x Per Day/30 Days Discharge Instructions: Secure with Kerlix as directed. Secured With: Transpore Surgical Tape, 2x10 (in/yd) 1 x Per Day/30 Days Discharge Instructions: Secure dressing with tape as directed. Electronic Signature(s) Signed: 01/22/2021 9:12:38 AM By: Geralyn CorwinHoffman, Judy Goodenow DO Entered By: Geralyn CorwinHoffman, Lindee Leason on 01/22/2021 09:06:15 -------------------------------------------------------------------------------- Problem List Details Patient Name: Date of Service: [name], [name] 01/22/2021 7:30 A M Medical Record Number: [id number] Patient Account Number: [id number] Date of Birth/Sex: Treating RN: [birth year] (41 y.o. Roel CluckF) Barnhart, Jodi Primary Care Provider: Gwinda PasseEdwards, Michelle Other Clinician: Referring Provider: Treating Provider/Extender: Grace IsaacHoffman, Arshia Rondon Edwards, Michelle Weeks in Treatment: 0 Active Problems ICD-10 Encounter Code Description Active Date MDM Diagnosis L97.514 Non-pressure chronic ulcer of other part of right foot with necrosis of bone 01/22/2021 No Yes E11.621 Type 2 diabetes mellitus with foot ulcer 01/22/2021 No Yes M86.9 Osteomyelitis, unspecified 01/22/2021 No Yes Inactive Problems Resolved Problems Electronic Signature(s) Signed: 01/22/2021 9:12:38 AM By: Geralyn CorwinHoffman, Giovany Cosby DO Entered By: Geralyn CorwinHoffman, Kiev Labrosse on 01/22/2021  08:58:23 -------------------------------------------------------------------------------- Progress Note Details Patient Name: Date of Service: [name], [name] 01/22/2021 7:30 A M Medical Record Number: [id number] Patient Account Number: [id number] Date of Birth/Sex: Treating RN: [birth year] (41 y.o. Roel CluckF) Barnhart, Jodi Primary Care Provider: Gwinda PasseEdwards, Michelle Other Clinician: Referring Provider: Treating Provider/Extender: Grace IsaacHoffman, Teresea Donley Edwards, Michelle Weeks in Treatment: 0 Subjective Chief Complaint Information obtained from Patient Osteomyelitis of the right foot status post transmetatarsal amputation with surgical site dehiscence History of Present Illness (HPI) Admission 01/22/2021 [name] a 41 year old female with a past medical history of insulin-dependent uncontrolled type 2 diabetes with last hemoglobin A1c of 13.5, osteomyelitis of the right foot status post transmetatarsal amputation on 12/18/2020 that presents to the clinic for right foot wound. She has had dehiscence of the surgical site. [name] using wet-to-dry dressings. She has a PICC line and receiving IV ceftriaxone daily for her osteomyelitis. There [name] an end date of 01/27/2021. [name]  also taking oral metronidazole. She currently denies systemic signs of infection. Patient History Information obtained from Patient. Allergies mango Family History Cancer - Paternal Grandparents, Diabetes - Mother, Hypertension - Mother, Stroke - Maternal Grandparents, No family history of Heart Disease, Hereditary Spherocytosis, Kidney Disease, Lung Disease, Seizures, Thyroid Problems, Tuberculosis. Social History Never smoker, Marital Status - Single, Alcohol Use - Rarely, Drug Use - Prior History - Marijuana, Caffeine Use - Daily. Medical History Cardiovascular Patient has history of Hypertension Endocrine Patient has history of Type II Diabetes Musculoskeletal Patient has history of Osteomyelitis - Right  Transmet 12/18/20 Neurologic Patient has history of Neuropathy Patient [name] treated with Insulin, Oral Agents. Blood sugar [name] tested. Review of Systems (ROS) Eyes Complains or has symptoms of Glasses / Contacts. Ear/Nose/Mouth/Throat Denies complaints or symptoms of Chronic sinus problems or rhinitis. Respiratory Denies complaints or symptoms of Chronic or frequent coughs, Shortness of Breath. Cardiovascular Denies complaints or symptoms of Chest pain. Gastrointestinal Denies complaints or symptoms of Frequent diarrhea, Nausea, Vomiting. Genitourinary Denies complaints or symptoms of Frequent urination. Integumentary (Skin) Complains or has symptoms of Wounds - Right Foot. Psychiatric Denies complaints or symptoms of Claustrophobia, Suicidal. Objective Constitutional respirations regular, non-labored and within target range for patient.. Vitals Time Taken: 7:54 AM, Height: 69 in, Source: Stated, Temperature: 98.7 F, Pulse: 92 bpm, Respiratory Rate: 16 breaths/min, Blood Pressure: 105/72 mmHg. General Notes: Patient has not checked blood sugar yet today Cardiovascular 2+ dorsalis pedis/posterior tibialis pulses. Psychiatric pleasant and cooperative. General Notes: Right foot: Transmetatarsal surgical wound site has dehisced at the first and second metatarsal site. The fifth metatarsal site appears to start dehiscing as well. There [name] probing to bone. There [name] also nonviable tissue with some granulation tissue at the wound bed. No purulent drainage. No increased warmth or erythema to the foot. Integumentary (Hair, Skin) Wound #1 status [name] Open. Original cause of wound was Surgical Injury. The date acquired was: 12/15/2020. The wound [name] located on the Right Amputation Site - Transmetatarsal. The wound measures 4.3cm length x 12.6cm width x 1.6cm depth; 42.553cm^2 area and 68.085cm^3 volume. There [name] Fat Layer (Subcutaneous Tissue) exposed. There [name] no tunneling or undermining noted.  There [name] a medium amount of serosanguineous drainage noted. The wound margin [name] fibrotic, thickened scar. There [name] small (1-33%) red granulation within the wound bed. There [name] a large (67-100%) amount of necrotic tissue within the wound bed including Eschar and Adherent Slough. Assessment Active Problems ICD-10 Non-pressure chronic ulcer of other part of right foot with necrosis of bone Type 2 diabetes mellitus with foot ulcer Osteomyelitis, unspecified Patient presents with a 1 month history of nonhealing surgical site to the right foot status post transmetatarsal amputation. On 12/17/2020 patient had an MRI of the right foot that showed osteomyelitis of the phalanges and metatarsal of the second ray. She subsequently underwent amputation by Dr. Netta Cedarseepak Ramanathan: 12/18/2020. She states that the area has dehisced over the past couple weeks. She has been using wet-to-dry dressings. She currently denies systemic signs of infection. [name] on IV ceftriaxone and oral metronidazole by infectious disease, Dr. Elinor ParkinsonManandhar there [name] an end date of 01/27/2021. Unfortunately patient [name] an uncontrolled insulin-dependent type 2 diabetic with last hemoglobin A1c of 13. We discussed the importance of glycemic control in wound healing. It was discussed that [name] at high risk for infection and potential BKA due to her uncontrolled glucose levels. She expressed understanding. Currently she denies systemic signs of infection. I recommended that she go to the  ED if she developed increased warmth, redness, swelling, increased pain to the right foot/leg. I debrided nonviable tissue and recommended Dakin's wet-to-dry dressings for now. 47 minutes was spent on the encounter including face-to-face, EMR review and coordination of care Procedures Wound #1 Pre-procedure diagnosis of Wound #1 [name] a T be determined located on the Right Amputation Site - Transmetatarsal . There was a Selective/Open Wound o Skin/Dermis  Debridement with a total area of 12 sq cm performed by Geralyn Corwin, DO. With the following instrument(s): Forceps, and Scissors to remove Non-Viable tissue/material. Material removed includes Slough and Skin: Dermis and after achieving pain control using Other (Benzocaine). No specimens were taken. A time out was conducted at 08:41, prior to the start of the procedure. A Minimum amount of bleeding was controlled with Pressure. The procedure was tolerated well. Post Debridement Measurements: 4.3cm length x 12.6cm width x 1.6cm depth; 68.085cm^3 volume. Character of Wound/Ulcer Post Debridement [name] stable. Post procedure Diagnosis Wound #1: Same as Pre-Procedure Plan Follow-up Appointments: Return Appointment in 1 week. - with Dr. Mikey Bussing Bathing/ Shower/ Hygiene: Other Bathing/Shower/Hygiene Orders/Instructions: - Clean with saline or Dakins Edema Control - Lymphedema / SCD / Other: Avoid standing for long periods of time. Moisturize legs daily. - May use Vaseline or Aquaphor Lotion Additional Orders / Instructions: Follow Nutritious Diet - -Monitor/Control Blood Sugar -High Protein Diet WOUND #1: - Amputation Site - Transmetatarsal Wound Laterality: Right Cleanser: Normal Saline 1 x Per Day/30 Days Discharge Instructions: Cleanse the wound with Normal Saline prior to applying a clean dressing using gauze sponges, not tissue or cotton balls. Topical: Dakin's solution 1 x Per Day/30 Days Discharge Instructions: Moisten Gauze with Dakins Secondary Dressing: Woven Gauze Sponge, Non-Sterile 4x4 in 1 x Per Day/30 Days Discharge Instructions: Apply over primary dressing as directed. Secondary Dressing: ABD Pad, 5x9 1 x Per Day/30 Days Discharge Instructions: Apply over primary dressing as directed. Secured With: [employer], 4.5x3.1 (in/yd) 1 x Per Day/30 Days Discharge Instructions: Secure with Kerlix as directed. Secured With: Transpore Surgical T ape, 2x10 (in/yd) 1 x Per Day/30  Days Discharge Instructions: Secure dressing with tape as directed. 1. In office sharp debridement 2. Dakin's wet-to-dry dressingsoopaper prescription given in office 3. Follow-up in 1 week Electronic Signature(s) Signed: 01/22/2021 9:12:38 AM By: Geralyn Corwin DO Entered By: Geralyn Corwin on 01/22/2021 09:11:53 -------------------------------------------------------------------------------- HxROS Details Patient Name: Date of Service: [name], [name] 01/22/2021 7:30 A M Medical Record Number: [id number] Patient Account Number: [id number] Date of Birth/Sex: Treating RN: 04-16-1980 (41 y.o. [name] Primary Care Provider: Gwinda Passe Other Clinician: Referring Provider: Treating Provider/Extender: Grace Isaac in Treatment: 0 Information Obtained From Patient Eyes Complaints and Symptoms: Positive for: Glasses / Contacts Ear/Nose/Mouth/Throat Complaints and Symptoms: Negative for: Chronic sinus problems or rhinitis Respiratory Complaints and Symptoms: Negative for: Chronic or frequent coughs; Shortness of Breath Cardiovascular Complaints and Symptoms: Negative for: Chest pain Medical History: Positive for: Hypertension Gastrointestinal Complaints and Symptoms: Negative for: Frequent diarrhea; Nausea; Vomiting Genitourinary Complaints and Symptoms: Negative for: Frequent urination Integumentary (Skin) Complaints and Symptoms: Positive for: Wounds - Right Foot Psychiatric Complaints and Symptoms: Negative for: Claustrophobia; Suicidal Hematologic/Lymphatic Endocrine Medical History: Positive for: Type II Diabetes Time with diabetes: Dx 2009 Treated with: Insulin, Oral agents Blood sugar tested every day: Yes Tested : daily Immunological Musculoskeletal Medical History: Positive for: Osteomyelitis - Right Transmet 12/18/20 Neurologic Medical History: Positive for:  Neuropathy Oncologic Immunizations Pneumococcal Vaccine: Received Pneumococcal Vaccination: No Implantable  Devices Yes Family and Social History Cancer: Yes - Paternal Grandparents; Diabetes: Yes - Mother; Heart Disease: No; Hereditary Spherocytosis: No; Hypertension: Yes - Mother; Kidney Disease: No; Lung Disease: No; Seizures: No; Stroke: Yes - Maternal Grandparents; Thyroid Problems: No; Tuberculosis: No; Never smoker; Marital Status - Single; Alcohol Use: Rarely; Drug Use: Prior History - Marijuana; Caffeine Use: Daily; Financial Concerns: No; Food, Clothing or Shelter Needs: No; Support System Lacking: No; Transportation Concerns: No Electronic Signature(s) Signed: 01/22/2021 9:12:38 AM By: Geralyn Corwin DO Signed: 01/22/2021 4:35:23 PM By: Antonieta Iba Entered By: Antonieta Iba on 01/22/2021 08:05:49 -------------------------------------------------------------------------------- SuperBill Details Patient Name: Date of Service: [name], [name] 01/22/2021 Medical Record Number: [id number] Patient Account Number: [id number] Date of Birth/Sex: Treating RN: 07-03-1980 (41 y.o. [name] Primary Care Provider: Gwinda Passe Other Clinician: Referring Provider: Treating Provider/Extender: Grace Isaac in Treatment: 0 Diagnosis Coding ICD-10 Codes Code Description [phone] Non-pressure chronic ulcer of other part of right foot with necrosis of bone E11.621 Type 2 diabetes mellitus with foot ulcer M86.9 Osteomyelitis, unspecified Facility Procedures CPT4 Code: [id number] Description: 99213 - WOUND CARE VISIT-LEV 3 EST PT Modifier: 25 Quantity: 1 CPT4 Code: [id number] Description: 97597 - DEBRIDE WOUND 1ST 20 SQ CM OR < ICD-10 Diagnosis Description L97.514 Non-pressure chronic ulcer of other part of right foot with necrosis of bone Modifier: Quantity: 1 Physician Procedures : CPT4 Code Description Modifier [id number] - WC PHYS LEVEL  4 - NEW PT ICD-10 Diagnosis Description L97.514 Non-pressure chronic ulcer of other part of right foot with necrosis of bone E11.621 Type 2 diabetes mellitus with foot ulcer M86.9  Osteomyelitis, unspecified Quantity: 1 : [id number] 97597 - WC PHYS DEBR WO ANESTH 20 SQ CM ICD-10 Diagnosis Description L97.514 Non-pressure chronic ulcer of other part of right foot with necrosis of bone Quantity: 1 Electronic Signature(s) Signed: 01/22/2021 9:21:07 AM By: Antonieta Iba Signed: 01/22/2021 10:02:14 AM By: Geralyn Corwin DO Previous Signature: 01/22/2021 9:12:38 AM Version By: Geralyn Corwin DO Entered By: Antonieta Iba on 01/22/2021 09:21:07

## 2021-01-22 NOTE — Progress Notes (Signed)
Schoeneck, Leisel L. ([id number]) Visit Report for 01/22/2021 Allergy List Details Patient Name: Date of Service: [name] MillardDA V IS, [name] 01/22/2021 7:30 A M Medical Record Number: [id number] Patient Account Number: [id number] Date of Birth/Sex: Treating RN: 1980/10/27 (41 y.o. Roel CluckF) Barnhart, Jodi Primary Care Gareth Fitzner: Gwinda PasseEdwards, Michelle Other Clinician: Referring Diamone Whistler: Treating Sehaj Kolden/Extender: Tobie PoetHoffman, Jessica Edwards, Michelle Weeks in Treatment: 0 Allergies Active Allergies mango Allergy Notes Electronic Signature(s) Signed: 01/22/2021 4:35:23 PM By: Bo McclintockBarnhart, Jodi Entered By: Antonieta IbaBarnhart, Jodi on 01/22/2021 07:56:49 -------------------------------------------------------------------------------- Arrival Information Details Patient Name: Date of Service: [name] V IS, [name] 01/22/2021 7:30 A M Medical Record Number: [id number] Patient Account Number: [id number] Date of Birth/Sex: Treating RN: 1980/10/27 (41 y.o. Roel CluckF) Barnhart, Jodi Primary Care Carmeline Kowal: Gwinda PasseEdwards, Michelle Other Clinician: Referring Jajaira Ruis: Treating Gene Glazebrook/Extender: Grace IsaacHoffman, Jessica Edwards, Michelle Weeks in Treatment: 0 Visit Information Patient Arrived: Wheel Chair Arrival Time: 07:53 Accompanied By: Mom Transfer Assistance: Manual Patient Identification Verified: Yes Secondary Verification Process Completed: Yes Patient Requires Transmission-Based No Precautions: Patient Has Alerts: Yes Patient Alerts: PICC R Arm ABI 12/17/20 R=1.08 L=1.13 Electronic Signature(s) Signed: 01/22/2021 4:35:23 PM By: Antonieta IbaBarnhart, Jodi Entered By: Antonieta IbaBarnhart, Jodi on 01/22/2021 08:19:20 -------------------------------------------------------------------------------- Clinic Level of Care Assessment Details Patient Name: Date of Service: [name] V IS, [name] 01/22/2021 7:30 A M Medical Record Number: [id number] Patient Account Number: [id number] Date of Birth/Sex: Treating RN: 1980/10/27 (41 y.o. Roel CluckF) Barnhart, Jodi Primary Care Tao Satz:  Gwinda PasseEdwards, Michelle Other Clinician: Referring Maxx Pham: Treating Krissie Merrick/Extender: Grace IsaacHoffman, Jessica Edwards, Michelle Weeks in Treatment: 0 Clinic Level of Care Assessment Items TOOL 1 Quantity Score X- 1 0 Use when EandM and Procedure [name] performed on INITIAL visit ASSESSMENTS - Nursing Assessment / Reassessment X- 1 20 General Physical Exam (combine w/ comprehensive assessment (listed just below) when performed on new pt. evals) X- 1 25 Comprehensive Assessment (HX, ROS, Risk Assessments, Wounds Hx, etc.) ASSESSMENTS - Wound and Skin Assessment / Reassessment []  - 0 Dermatologic / Skin Assessment (not related to wound area) ASSESSMENTS - Ostomy and/or Continence Assessment and Care []  - 0 Incontinence Assessment and Management []  - 0 Ostomy Care Assessment and Management (repouching, etc.) PROCESS - Coordination of Care []  - 0 Simple Patient / Family Education for ongoing care X- 1 20 Complex (extensive) Patient / Family Education for ongoing care X- 1 10 Staff obtains ChiropractorConsents, Records, T Results / Process Orders est X- 1 10 Staff telephones HHA, Nursing Homes / Clarify orders / etc []  - 0 Routine Transfer to another Facility (non-emergent condition) []  - 0 Routine Hospital Admission (non-emergent condition) []  - 0 New Admissions / Manufacturing engineernsurance Authorizations / Ordering NPWT Apligraf, etc. , []  - 0 Emergency Hospital Admission (emergent condition) PROCESS - Special Needs []  - 0 Pediatric / Minor Patient Management []  - 0 Isolation Patient Management []  - 0 Hearing / Language / Visual special needs []  - 0 Assessment of Community assistance (transportation, D/C planning, etc.) []  - 0 Additional assistance / Altered mentation []  - 0 Support Surface(s) Assessment (bed, cushion, seat, etc.) INTERVENTIONS - Miscellaneous []  - 0 External ear exam []  - 0 Patient Transfer (multiple staff / Nurse, adultHoyer Lift / Similar devices) []  - 0 Simple Staple / Suture removal (25 or  less) []  - 0 Complex Staple / Suture removal (26 or more) []  - 0 Hypo/Hyperglycemic Management (do not check if billed separately) []  - 0 Ankle / Brachial Index (ABI) - do not check if billed separately Has the patient been seen at the hospital within the last  three years: Yes Total Score: 85 Level Of Care: New/Established - Level 3 Electronic Signature(s) Signed: 01/22/2021 4:35:23 PM By: Antonieta Iba Signed: 01/22/2021 4:35:23 PM By: Antonieta Iba Entered By: Antonieta Iba on 01/22/2021 08:53:39 -------------------------------------------------------------------------------- Encounter Discharge Information Details Patient Name: Date of Service: [name] V IS, [name] 01/22/2021 7:30 A M Medical Record Number: [id number] Patient Account Number: [id number] Date of Birth/Sex: Treating RN: 1980/06/13 (41 y.o. [name] Primary Care King Pinzon: Gwinda Passe Other Clinician: Referring Tymeir Weathington: Treating Flynn Gwyn/Extender: Grace Isaac in Treatment: 0 Encounter Discharge Information Items Post Procedure Vitals Discharge Condition: Stable Temperature (F): 98.7 Ambulatory Status: Wheelchair Pulse (bpm): 92 Discharge Destination: Home Respiratory Rate (breaths/min): 16 Transportation: Other Blood Pressure (mmHg): 105/72 Accompanied By: Mother Schedule Follow-up Appointment: Yes Clinical Summary of Care: Provided on 01/22/2021 Form Type Recipient Paper Patient Patient Electronic Signature(s) Signed: 01/22/2021 9:23:31 AM By: Antonieta Iba Entered By: Antonieta Iba on 01/22/2021 09:23:31 -------------------------------------------------------------------------------- Lower Extremity Assessment Details Patient Name: Date of Service: [name] V IS, [name] 01/22/2021 7:30 A M Medical Record Number: [id number] Patient Account Number: [id number] Date of Birth/Sex: Treating RN: 1980-06-30 (41 y.o. [name] Primary Care Andjela Wickes: Gwinda Passe Other Clinician: Referring Liliauna Santoni: Treating Adileny Delon/Extender: Tobie Poet Weeks in Treatment: 0 Edema Assessment Assessed: [Left: No] [Right: Yes] E[Left: dema] [Right: :] Calf Left: Right: Point of Measurement: 34 cm From Medial Instep 42 cm Ankle Left: Right: Point of Measurement: 8 cm From Medial Instep 26.5 cm Vascular Assessment Pulses: Dorsalis Pedis Palpable: [Right:No Yes] Notes ABI's 12/17/20 Right: 1.08 L: 1/13 TBI: Left 0.89 Electronic Signature(s) Signed: 01/22/2021 4:35:23 PM By: Antonieta Iba Entered By: Antonieta Iba on 01/22/2021 08:18:43 -------------------------------------------------------------------------------- Multi Wound Chart Details Patient Name: Date of Service: [name] V IS, [name] 01/22/2021 7:30 A M Medical Record Number: [id number] Patient Account Number: [id number] Date of Birth/Sex: Treating RN: [birth year] (41 y.o. [name] Primary Care Vadis Slabach: Gwinda Passe Other Clinician: Referring Teegan Brandis: Treating Okley Magnussen/Extender: Grace Isaac in Treatment: 0 Vital Signs Height(in): 69 Pulse(bpm): 92 Weight(lbs): Blood Pressure(mmHg): 105/72 Body Mass Index(BMI): Temperature(F): 98.7 Respiratory Rate(breaths/min): 16 Photos: [N/A:N/A] Right Amputation Site - N/A N/A Wound Location: Transmetatarsal Surgical Injury N/A N/A Wounding Event: T be determined o N/A N/A Primary Etiology: Hypertension, Type II Diabetes, N/A N/A Comorbid History: Osteomyelitis, Neuropathy 12/15/2020 N/A N/A Date Acquired: 0 N/A N/A Weeks of Treatment: Open N/A N/A Wound Status: 4.3x12.6x1.6 N/A N/A Measurements L x W x D (cm) 42.553 N/A N/A A (cm) : rea 68.085 N/A N/A Volume (cm) : 0.00% N/A N/A % Reduction in A rea: 0.00% N/A N/A % Reduction in Volume: Full Thickness Without Exposed N/A N/A Classification: Support Structures Medium N/A N/A Exudate A  mount: Serosanguineous N/A N/A Exudate Type: red, brown N/A N/A Exudate Color: Fibrotic scar, thickened scar N/A N/A Wound Margin: Small (1-33%) N/A N/A Granulation A mount: Red N/A N/A Granulation Quality: Large (67-100%) N/A N/A Necrotic A mount: Eschar, Adherent Slough N/A N/A Necrotic Tissue: Fat Layer (Subcutaneous Tissue): Yes N/A N/A Exposed Structures: Fascia: No Tendon: No Muscle: No Joint: No Bone: No None N/A N/A Epithelialization: Debridement - Selective/Open Wound N/A N/A Debridement: Pre-procedure Verification/Time Out 08:41 N/A N/A Taken: Other N/A N/A Pain Control: Slough N/A N/A Tissue Debrided: Skin/Dermis N/A N/A Level: 12 N/A N/A Debridement A (sq cm): rea Forceps, Scissors N/A N/A Instrument: Minimum N/A N/A Bleeding: Pressure N/A N/A Hemostasis Achieved: Procedure was tolerated well N/A N/A Debridement Treatment Response:  4.3x12.6x1.6 N/A N/A Post Debridement Measurements L x W x D (cm) 68.085 N/A N/A Post Debridement Volume: (cm) Debridement N/A N/A Procedures Performed: Treatment Notes Electronic Signature(s) Signed: 01/22/2021 9:12:38 AM By: Geralyn CorwinHoffman, Jessica DO Signed: 01/22/2021 4:35:23 PM By: Antonieta IbaBarnhart, Jodi Entered By: Geralyn CorwinHoffman, Jessica on 01/22/2021 08:58:40 -------------------------------------------------------------------------------- Multi-Disciplinary Care Plan Details Patient Name: Date of Service: [name] V IS, [name] 01/22/2021 7:30 A M Medical Record Number: [id number] Patient Account Number: [id number] Date of Birth/Sex: Treating RN: 1980/11/05 (41 y.o. Roel CluckF) Barnhart, Jodi Primary Care Stephaun Million: Gwinda PasseEdwards, Michelle Other Clinician: Referring Karman Biswell: Treating Delylah Stanczyk/Extender: Grace IsaacHoffman, Jessica Edwards, Michelle Weeks in Treatment: 0 Active Inactive Nutrition Nursing Diagnoses: Impaired glucose control: actual or potential Goals: Patient/caregiver verbalizes understanding of need to maintain therapeutic glucose  control per primary care physician Date Initiated: 01/22/2021 Target Resolution Date: 02/19/2021 Goal Status: Active Interventions: Assess HgA1c results as ordered upon admission and as needed Provide education on elevated blood sugars and impact on wound healing Treatment Activities: Obtain HgA1c : 01/22/2021 Notes: Wound/Skin Impairment Nursing Diagnoses: Impaired tissue integrity Goals: Patient/caregiver will verbalize understanding of skin care regimen Date Initiated: 01/22/2021 Target Resolution Date: 02/19/2021 Goal Status: Active Ulcer/skin breakdown will have a volume reduction of 30% by week 4 Date Initiated: 01/22/2021 Target Resolution Date: 02/19/2021 Goal Status: Active Interventions: Assess patient/caregiver ability to obtain necessary supplies Assess patient/caregiver ability to perform ulcer/skin care regimen upon admission and as needed Assess ulceration(s) every visit Provide education on ulcer and skin care Treatment Activities: Topical wound management initiated : 01/22/2021 Notes: Electronic Signature(s) Signed: 01/22/2021 4:35:23 PM By: Antonieta IbaBarnhart, Jodi Entered By: Antonieta IbaBarnhart, Jodi on 01/22/2021 08:21:00 -------------------------------------------------------------------------------- Pain Assessment Details Patient Name: Date of Service: [name] Seth BakeV IS, [name] 01/22/2021 7:30 A M Medical Record Number: [id number] Patient Account Number: [id number] Date of Birth/Sex: Treating RN: 1980/11/05 (41 y.o. Roel CluckF) Barnhart, Jodi Primary Care Deshonda Cryderman: Gwinda PasseEdwards, Michelle Other Clinician: Referring Tonni Mansour: Treating Keone Kamer/Extender: Grace IsaacHoffman, Jessica Edwards, Michelle Weeks in Treatment: 0 Active Problems Location of Pain Severity and Description of Pain Patient Has Paino Yes Site Locations Pain Location: Pain in Ulcers With Dressing Change: Yes Duration of the Pain. Constant / Intermittento Intermittent Rate the pain. Current Pain Level: 2 Character of Pain Describe the Pain:  Tender, Throbbing Pain Management and Medication Current Pain Management: Medication: Yes Cold Application: No Rest: Yes Massage: No Activity: No T.E.N.S.: No Heat Application: No Leg drop or elevation: No [name] the Current Pain Management Adequate: Adequate How does your wound impact your activities of daily livingo Sleep: No Bathing: No Appetite: No Relationship With Others: No Bladder Continence: No Emotions: No Bowel Continence: No Work: No Toileting: No Drive: No Dressing: No Hobbies: No Electronic Signature(s) Signed: 01/22/2021 4:35:23 PM By: Antonieta IbaBarnhart, Jodi Entered By: Antonieta IbaBarnhart, Jodi on 01/22/2021 08:16:17 -------------------------------------------------------------------------------- Patient/Caregiver Education Details Patient Name: Date of Service: [name] Delman KittenV IS, [name] RKIA Elbert EwingsL. 1/9/2023andnbsp7:30 A M Medical Record Number: [id number] Patient Account Number: [id number] Date of Birth/Gender: Treating RN: 1980/11/05 (41 y.o. Roel CluckF) Barnhart, Jodi Primary Care Physician: Gwinda PasseEdwards, Michelle Other Clinician: Referring Physician: Treating Physician/Extender: Grace IsaacHoffman, Jessica Edwards, Michelle Weeks in Treatment: 0 Education Assessment Education Provided To: Patient Education Topics Provided Elevated Blood Sugar/ Impact on Healing: Handouts: Elevated Blood Sugars: How Do They Affect Wound Healing Methods: Explain/Verbal, Printed Responses: State content correctly Wound/Skin Impairment: Methods: Demonstration, Explain/Verbal, Printed Responses: State content correctly Electronic Signature(s) Signed: 01/22/2021 4:35:23 PM By: Antonieta IbaBarnhart, Jodi Entered By: Antonieta IbaBarnhart, Jodi on 01/22/2021 08:22:00 -------------------------------------------------------------------------------- Wound Assessment Details Patient Name: Date of Service: [name] V IS,  [name] 01/22/2021 7:30 A M Medical Record Number: [id number] Patient Account Number: [id number] Date of Birth/Sex: Treating RN: 1980-05-29 (41  y.o. [name] Primary Care Jakera Beaupre: Gwinda Passe Other Clinician: Referring Zoeya Gramajo: Treating Jaylah Goodlow/Extender: Tobie Poet Weeks in Treatment: 0 Wound Status Wound Number: 1 Primary Etiology: T be determined o Wound Location: Right Amputation Site - Transmetatarsal Wound Status: Open Wounding Event: Surgical Injury Comorbid Hypertension, Type II Diabetes, Osteomyelitis, History: Neuropathy Date Acquired: 12/15/2020 Weeks Of Treatment: 0 Clustered Wound: No Photos Wound Measurements Length: (cm) 4.3 Width: (cm) 12.6 Depth: (cm) 1.6 Area: (cm) 42.553 Volume: (cm) 68.085 % Reduction in Area: 0% % Reduction in Volume: 0% Epithelialization: None Tunneling: No Undermining: No Wound Description Classification: Full Thickness Without Exposed Support Structures Wound Margin: Fibrotic scar, thickened scar Exudate Amount: Medium Exudate Type: Serosanguineous Exudate Color: red, brown Foul Odor After Cleansing: No Slough/Fibrino Yes Wound Bed Granulation Amount: Small (1-33%) Exposed Structure Granulation Quality: Red Fascia Exposed: No Necrotic Amount: Large (67-100%) Fat Layer (Subcutaneous Tissue) Exposed: Yes Necrotic Quality: Eschar, Adherent Slough Tendon Exposed: No Muscle Exposed: No Joint Exposed: No Bone Exposed: No Treatment Notes Wound #1 (Amputation Site - Transmetatarsal) Wound Laterality: Right Cleanser Normal Saline Discharge Instruction: Cleanse the wound with Normal Saline prior to applying a clean dressing using gauze sponges, not tissue or cotton balls. Peri-Wound Care Topical Dakin's solution Discharge Instruction: Moisten Gauze with Dakins Primary Dressing Secondary Dressing Woven Gauze Sponge, Non-Sterile 4x4 in Discharge Instruction: Apply over primary dressing as directed. ABD Pad, 5x9 Discharge Instruction: Apply over primary dressing as directed. Secured With [employer], 4.5x3.1  (in/yd) Discharge Instruction: Secure with Kerlix as directed. Transpore Surgical Tape, 2x10 (in/yd) Discharge Instruction: Secure dressing with tape as directed. Compression Wrap Compression Stockings Add-Ons Electronic Signature(s) Signed: 01/22/2021 4:35:23 PM By: Antonieta Iba Entered By: Antonieta Iba on 01/22/2021 08:15:36 -------------------------------------------------------------------------------- Vitals Details Patient Name: Date of Service: [name] V IS, [name] 01/22/2021 7:30 A M Medical Record Number: [id number] Patient Account Number: [id number] Date of Birth/Sex: Treating RN: 03-May-1980 (41 y.o. [name] Primary Care Renell Allum: Gwinda Passe Other Clinician: Referring Wells Gerdeman: Treating Hue Steveson/Extender: Grace Isaac in Treatment: 0 Vital Signs Time Taken: 07:54 Temperature (F): 98.7 Height (in): 69 Pulse (bpm): 92 Source: Stated Respiratory Rate (breaths/min): 16 Blood Pressure (mmHg): 105/72 Reference Range: 80 - 120 mg / dl Notes Patient has not checked blood sugar yet today Electronic Signature(s) Signed: 01/22/2021 4:35:23 PM By: Antonieta Iba Entered By: Antonieta Iba on 01/22/2021 07:56:08

## 2021-01-22 NOTE — Telephone Encounter (Signed)
Patient called wanting to know when PICC line can be removed. IV antibiotic end date is 1/14, provider's note mentions possibly switching the patient to oral metronidazole. Patient is scheduled for follow up with Dr. Elinor Parkinson 1/25. Will route to provider.   Sandie Ano, RN

## 2021-01-22 NOTE — Progress Notes (Signed)
Brazell, Arianna L. ([id number]) Visit Report for 01/22/2021 Abuse/Suicide Risk Screen Details Patient Name: Date of Service: [name], [name] [name] 01/22/2021 7:30 A M Medical Record Number: [id number] Patient Account Number: [id number] Date of Birth/Sex: Treating RN: 1980/05/25 (41 y.o. [name] Primary Care Alizea Pell: Juluis Mire Other Clinician: Referring Madelyne Millikan: Treating Myer Bohlman/Extender: Edmonia Lynch in Treatment: 0 Abuse/Suicide Risk Screen Items Answer ABUSE RISK SCREEN: Has anyone close to you tried to hurt or harm you recentlyo No Do you feel uncomfortable with anyone in your familyo No Has anyone forced you do things that you didnt want to doo No Electronic Signature(s) Signed: 01/22/2021 4:35:23 PM By: Lorrin Jackson Entered By: Lorrin Jackson on 01/22/2021 08:05:58 -------------------------------------------------------------------------------- Activities of Daily Living Details Patient Name: Date of Service: [name], [name] 01/22/2021 7:30 A M Medical Record Number: [id number] Patient Account Number: [id number] Date of Birth/Sex: Treating RN: February 06, 1980 (41 y.o. [name] Primary Care Adorian Gwynne: Juluis Mire Other Clinician: Referring Addalyn Speedy: Treating Zamyia Gowell/Extender: Edmonia Lynch in Treatment: 0 Activities of Daily Living Items Answer Activities of Daily Living (Please select one for each item) Drive Automobile Need Assistance T Medications ake Completely Able Use T elephone Completely Able Care for Appearance Completely Able Use T oilet Completely Able Bath / Shower Completely Able Dress Self Completely Able Feed Self Completely Able Walk Need Assistance Get In / Out Bed Completely [address] [place] for Self Need Assistance Electronic Signature(s) Signed: 01/22/2021 4:35:23 PM By: Lorrin Jackson Entered By:  Lorrin Jackson on 01/22/2021 08:06:45 -------------------------------------------------------------------------------- Education Screening Details Patient Name: Date of Service: [name], [name] 01/22/2021 7:30 A M Medical Record Number: [id number] Patient Account Number: [id number] Date of Birth/Sex: Treating RN: 22-Dec-1980 (41 y.o. [name] Primary Care Finesse Fielder: Juluis Mire Other Clinician: Referring Rhodie Cienfuegos: Treating Oliana Gowens/Extender: Edmonia Lynch in Treatment: 0 Primary Learner Assessed: Patient Learning Preferences/Education Level/Primary Language Learning Preference: Explanation, Demonstration, Printed Material Highest Education Level: College or Above Preferred Language: English Cognitive Barrier Language Barrier: No Translator Needed: No Memory Deficit: No Emotional Barrier: No Cultural/Religious Beliefs Affecting Medical Care: No Physical Barrier Impaired Vision: Yes Glasses Impaired Hearing: No Decreased Hand dexterity: No Knowledge/Comprehension Knowledge Level: Medium Comprehension Level: High Ability to understand written instructions: High Ability to understand verbal instructions: High Motivation Anxiety Level: Calm Cooperation: Cooperative Education Importance: Acknowledges Need Interest in Health Problems: Asks Questions Perception: Coherent Willingness to Engage in Self-Management Medium Activities: Readiness to Engage in Self-Management Medium Activities: Electronic Signature(s) Signed: 01/22/2021 4:35:23 PM By: Lorrin Jackson Entered By: Lorrin Jackson on 01/22/2021 08:07:28 -------------------------------------------------------------------------------- Fall Risk Assessment Details Patient Name: Date of Service: [name], [name] 01/22/2021 7:30 A M Medical Record Number: [id number] Patient Account Number: [id number] Date of Birth/Sex: Treating RN: September 09, 1980 (41 y.o. [name] Primary Care  Shaina Gullatt: Juluis Mire Other Clinician: Referring Salley Boxley: Treating Drina Jobst/Extender: Edmonia Lynch in Treatment: 0 Fall Risk Assessment Items Have you had 2 or more falls in the last 12 monthso 0 No Have you had any fall that resulted in injury in the last 12 monthso 0 No FALLS RISK SCREEN History of falling - immediate or within 3 months 0 No Secondary diagnosis (Do you have 2 or more medical diagnoseso) 15 Yes Ambulatory aid None/bed rest/wheelchair/nurse 0 No Crutches/cane/walker 15 Yes Furniture 0 No Intravenous therapy Access/Saline/Heparin Lock 0 No Gait/Transferring Normal/ bed  rest/ wheelchair 0 No Weak (short steps with or without shuffle, stooped but able to lift head while walking, may seek 10 Yes support from furniture) Impaired (short steps with shuffle, may have difficulty arising from chair, head down, impaired 0 No balance) Mental Status Oriented to own ability 0 Yes Electronic Signature(s) Signed: 01/22/2021 4:35:23 PM By: Lorrin Jackson Entered By: Lorrin Jackson on 01/22/2021 08:07:42 -------------------------------------------------------------------------------- Foot Assessment Details Patient Name: Date of Service: [name], [name] 01/22/2021 7:30 A M Medical Record Number: [id number] Patient Account Number: [id number] Date of Birth/Sex: Treating RN: [birth year] (41 y.o. [name] Primary Care Anael Rosch: Juluis Mire Other Clinician: Referring Basilia Stuckert: Treating Hayleigh Bawa/Extender: Geryl Councilman Weeks in Treatment: 0 Foot Assessment Items Site Locations + = Sensation present, - = Sensation absent, C = Callus, U = Ulcer R = Redness, W = Warmth, M = Maceration, PU = Pre-ulcerative lesion F = Fissure, S = Swelling, D = Dryness Assessment Right: Left: Other Deformity: No No Prior Foot Ulcer: Yes No Prior Amputation: Yes No Charcot Joint: Yes No Ambulatory Status: Ambulatory With  Help Assistance Device: Wheelchair Gait: Steady Notes Transmet 12/18/20 Electronic Signature(s) Signed: 01/22/2021 4:35:23 PM By: Lorrin Jackson Entered By: Lorrin Jackson on 01/22/2021 08:09:56 -------------------------------------------------------------------------------- Nutrition Risk Screening Details Patient Name: Date of Service: [name], [name] 01/22/2021 7:30 A M Medical Record Number: [id number] Patient Account Number: [id number] Date of Birth/Sex: Treating RN: 12-23-1980 (41 y.o. [name] Primary Care Cola Highfill: Juluis Mire Other Clinician: Referring Marizol Borror: Treating Kiyoto Slomski/Extender: Geryl Councilman Weeks in Treatment: 0 Height (in): 69 Weight (lbs): Body Mass Index (BMI): Nutrition Risk Screening Items Score Screening NUTRITION RISK SCREEN: I have an illness or condition that made me change the kind and/or amount of food I eat 0 No I eat fewer than two meals per day 0 No I eat few fruits and vegetables, or milk products 0 No I have three or more drinks of beer, liquor or wine almost every day 0 No I have tooth or mouth problems that make it hard for me to eat 0 No I don't always have enough money to buy the food I need 0 No I eat alone most of the time 0 No I take three or more different prescribed or over-the-counter drugs a day 1 Yes Without wanting to, I have lost or gained 10 pounds in the last six months 0 No I am not always physically able to shop, cook and/or feed myself 0 No Nutrition Protocols Good Risk Protocol 0 No interventions needed Moderate Risk Protocol High Risk Proctocol Risk Level: Good Risk Score: 1 Electronic Signature(s) Signed: 01/22/2021 4:35:23 PM By: Lorrin Jackson Entered By: Lorrin Jackson on 01/22/2021 08:07:58

## 2021-01-23 NOTE — Telephone Encounter (Signed)
Spoke with patient, she is only able to do virtual visit tomorrow due to transportation on short notice.   Spoke with rachel and let her know that IV antibiotic plan will be discussed tomorrow about whether to continue or stop.   Labs are available via [facility].   Sandie Ano, RN

## 2021-01-23 NOTE — Telephone Encounter (Signed)
Fleet Contras, home health nurse with [facility], called wanting to know if antibiotics will be extended or if PICC can be pulled after last dose on 01/27/21. Will route to provider.   Ted Mcalpine: [phone]  Sandie Ano, RN

## 2021-01-23 NOTE — Telephone Encounter (Signed)
Ok Thx 

## 2021-01-24 ENCOUNTER — Ambulatory Visit (INDEPENDENT_AMBULATORY_CARE_PROVIDER_SITE_OTHER): Payer: Medicaid Other | Admitting: Infectious Diseases

## 2021-01-24 ENCOUNTER — Other Ambulatory Visit: Payer: Self-pay

## 2021-01-24 ENCOUNTER — Encounter: Payer: Self-pay | Admitting: Infectious Diseases

## 2021-01-24 DIAGNOSIS — Z5181 Encounter for therapeutic drug level monitoring: Secondary | ICD-10-CM | POA: Diagnosis not present

## 2021-01-24 DIAGNOSIS — M869 Osteomyelitis, unspecified: Secondary | ICD-10-CM | POA: Diagnosis not present

## 2021-01-24 DIAGNOSIS — T8130XA Disruption of wound, unspecified, initial encounter: Secondary | ICD-10-CM

## 2021-01-24 NOTE — Progress Notes (Addendum)
Virtual Visit via Telephone Note  I connected withNAME@ on 01/24/21 at  9:45 AM EST by a telephone enabled telemedicine application and verified that I am speaking with the correct person using two identifiers.  Location: Patient:Home  Provider: [facility]   I discussed the limitations of evaluation and management by telemedicine and the availability of in person appointments. The patient expressed understanding and agreed to proceed.  [place] for Infectious Disease  Patient Active Problem List   Diagnosis Date Noted   Medication monitoring encounter 01/02/2021   Diabetic ulcer of right foot associated with diabetes mellitus due to underlying condition ([place]) 01/02/2021   Osteomyelitis of right foot ([place])    Osteomyelitis of second toe of right foot ([place]) 12/16/2020   Type 2 diabetes mellitus with hyperglycemia ([place]) 12/16/2020   Class 3 obesity ([place]) 12/16/2020   Hyperlipidemia 12/16/2020   Soft tissue mass 09/15/2015   Current Outpatient Medications on File Prior to Visit  Medication Sig Dispense Refill   acetaminophen (TYLENOL) 325 MG tablet Take 2 tablets (650 mg total) by mouth every 6 (six) hours as needed for mild pain (or Fever >/= 101).     cefTRIAXone (ROCEPHIN) IVPB Inject 2 g into the vein daily. Indication:  diabetic foot infection/osteomyelitis  First Dose: Yes Last Day of Therapy:  01/27/21 Labs - Once weekly:  CBC/D and BMP, Labs - Every other week:  ESR and CRP Method of administration: IV Push Method of administration may be changed at the discretion of home infusion pharmacist based upon assessment of the patient and/or caregiver's ability to self-administer the medication ordered. 38 Units 0   gabapentin (NEURONTIN) 300 MG capsule Take 2 capsules (600 mg total) by mouth 3 (three) times daily. 180 capsule 1   Insulin Glargine (BASAGLAR KWIKPEN) 100 UNIT/ML Inject 56 units into the skin daily. 56 mL 3   Insulin Pen Needle (PEN NEEDLES 3/16") 31G X 5 MM MISC 40  Units by Does not apply route 2 (two) times daily before a meal. 100 each 0   lisinopril (ZESTRIL) 2.5 MG tablet Take 1 tablet by mouth daily 90 tablet 0   metFORMIN (GLUCOPHAGE-XR) 500 MG 24 hr tablet Take 1 tablet by mouth twice daily. 180 tablet 1   ondansetron (ZOFRAN) 4 MG tablet Take 1 tablet (4 mg total) by mouth every 6 (six) hours as needed for nausea. 20 tablet 0   oxyCODONE (OXY IR/ROXICODONE) 5 MG immediate release tablet Take 1 tablet (5 mg total) by mouth every 3 (three) hours as needed for moderate pain. 15 tablet 0   No current facility-administered medications on file prior to visit.    History of Present Illness: Here for follow up in the setting of DFU and osteomyelitis of RT foot. She is getting IV ceftriaxone through her PICC. No concerns with PICC except difficulty getting blood draws. She complains of some brownish discharge from the RT TMA site but denies swelling and redness. She has been closely followed by wound care. Last visit with wound care on 1/9 where there was concerns for " wound site dehiscence  at the first and second metatarsal site, the 5th metatarsal site also starting to dehisce., probing to bone. Non viable tissues with some granulation tissues at the wound bed. No purulent drainage. No increased warmth and erythema to the foot." She also had debridement done to removed dead and nonviable tissues. She has a fu with Ortho next week for suture removal. She also complains of pain in the back of  the rt leg and rt thigh with no redness and swelling.   ROS: Denies fevers, chills, sweats Denies nausea, vomiting, abdominal pain and diarrhea  All other systems reviewed and negative as above   Past Medical History:  Diagnosis Date   Class 3 obesity ([place]) 12/16/2020   Diabetes mellitus     Social History   Tobacco Use   Smoking status: Never  Vaping Use   Vaping Use: Never used  Substance Use Topics   Alcohol use: Yes    Comment: occ   Drug use: Yes     Types: Marijuana    Family History  Problem Relation Age of Onset   Hypertension Mother    Diabetes Mother    Diabetes Other     Allergies  Allergen Reactions   Mangifera Indica Rash    FRESH MANGO    Health Maintenance  Topic Date Due   FOOT EXAM  Never done   OPHTHALMOLOGY EXAM  Never done   Hepatitis C Screening  Never done   PAP SMEAR-Modifier  Never done   COVID-19 Vaccine (1) 02/02/2021 (Originally 12/07/1980)   INFLUENZA VACCINE  04/13/2021 (Originally 08/14/2020)   Pneumococcal Vaccine 75-33 Years old (1 - PCV) 01/17/2022 (Originally 06/07/1987)   TETANUS/TDAP  01/17/2022 (Originally 06/06/2000)   HEMOGLOBIN A1C  06/16/2021   HIV Screening  Completed   HPV VACCINES  Aged Out    Observations/Objective:   Assessment and Plan: DFU/osteomyelitis of second ray including necrosis and abscess -S/p right foot transmetatarsal amputation, right foot I&D of deep abscess 12/5.  OR cultures Strep agalactiae - ABI -unremarkable for significant arterial disease  Possible wound dehiscence at the RT TMA Medication Monitoring Uncontrolled DM-A1c 13.5, follows with PCP Obesity  Follow Up Instructions: Continue IV ceftriaxone for 1 more week from previous end date 1/14 given reported worsening findings by patient and wound care notes I will follow her up in person next week and reassess the wound regarding discontinuing antibiotics vs switch to PO. She was not able to come for in person eval today   I discussed the assessment and treatment plan with the patient. The patient was provided an opportunity to ask questions and all were answered. The patient agreed with the plan and demonstrated an understanding of the instructions.   The patient was advised to call back or seek an in-person evaluation if the symptoms worsen or if the condition fails to improve as anticipated.  I provided 20 minutes of non-face-to-face time during this encounter.  Wilber Oliphant, [place]  for [facility] Phone [phone] Fax no. [phone]  01/24/2021, 9:28 AM

## 2021-01-25 ENCOUNTER — Ambulatory Visit (INDEPENDENT_AMBULATORY_CARE_PROVIDER_SITE_OTHER): Payer: Self-pay | Admitting: *Deleted

## 2021-01-25 NOTE — Telephone Encounter (Signed)
°  Chief Complaint: Pain behind right knee, operative side, toes amputated Symptoms: Pain, constant, level varies 3-5/10, constant Frequency: Onset 1 week ago Pertinent Negatives: Patient denies SOB, redness, warmth, swelling Disposition: [x] ED /[] Urgent Care (no appt availability in office) / [] Appointment(In office/virtual)/ []  [facility]/ [] Home Care/ [] Refused Recommended Disposition /[] [facility]/ []  Follow-up with PCP Additional Notes: Unsure pt will follow disposition.     Reason for Disposition  Difficulty breathing    No SOB but pain behind knee at surgical leg  Answer Assessment - Initial Assessment Questions 1. ONSET: "When did the pain start?"      Last friday 2. LOCATION: "Where is the pain located?"      Behind knee and calf, right leg 3. PAIN: "How bad is the pain?"    (Scale 1-10; or mild, moderate, severe)   -  MILD (1-3): doesn't interfere with normal activities    -  MODERATE (4-7): interferes with normal activities (e.g., work or school) or awakens from sleep, limping    -  SEVERE (8-10): excruciating pain, unable to do any normal activities, unable to walk     Constant, varies 3-4/10  6-7/10 at times 4. WORK OR EXERCISE: "Has there been any recent work or exercise that involved this part of the body?"      No, surgical leg 5. CAUSE: "What do you think is causing the leg pain?"     Unsure 6. OTHER SYMPTOMS: "Do you have any other symptoms?" (e.g., chest pain, back pain, breathing difficulty, swelling, rash, fever, numbness, weakness)     SOB with exertion, "Not new"  Protocols used: Leg Pain-A-AH

## 2021-01-26 ENCOUNTER — Other Ambulatory Visit (HOSPITAL_COMMUNITY): Payer: Self-pay

## 2021-01-26 ENCOUNTER — Ambulatory Visit (INDEPENDENT_AMBULATORY_CARE_PROVIDER_SITE_OTHER): Payer: Self-pay

## 2021-01-26 NOTE — Telephone Encounter (Signed)
See triage notes

## 2021-01-26 NOTE — Telephone Encounter (Signed)
Pt called, she was asking about SCDs being used at home since surgery to prevent blood clots and help healing. Advised pt that would be something HH would bring out and set up. Advised pt to call and speak with Ortho surgeon office so see if that something they can order and set up for her. Pt verbalized understanding.   Reason for Disposition  Other post-op symptom or question  Answer Assessment - Initial Assessment Questions 1. SYMPTOM: "What's the main symptom you're concerned about?" (e.g., pain, fever, vomiting)     Wanting something for legs to prevent blood clots  Protocols used: Post-Op Symptoms and Questions-A-AH

## 2021-01-30 ENCOUNTER — Other Ambulatory Visit (HOSPITAL_COMMUNITY): Payer: Self-pay | Admitting: Orthopaedic Surgery

## 2021-01-30 ENCOUNTER — Ambulatory Visit (HOSPITAL_COMMUNITY)
Admission: RE | Admit: 2021-01-30 | Discharge: 2021-01-30 | Disposition: A | Discharge status: Home / Self Care | Payer: Medicaid Other | Source: Ambulatory Visit | Attending: Internal Medicine | Admitting: Internal Medicine

## 2021-01-30 ENCOUNTER — Encounter (HOSPITAL_COMMUNITY): Payer: Self-pay

## 2021-01-30 ENCOUNTER — Other Ambulatory Visit: Payer: Self-pay

## 2021-01-30 ENCOUNTER — Emergency Department (HOSPITAL_COMMUNITY)
Admission: EM | Admit: 2021-01-30 | Discharge: 2021-01-30 | Disposition: A | Discharge status: Home / Self Care | Payer: Self-pay | Attending: Emergency Medicine | Admitting: Emergency Medicine

## 2021-01-30 DIAGNOSIS — I824Y1 Acute embolism and thrombosis of unspecified deep veins of right proximal lower extremity: Secondary | ICD-10-CM | POA: Insufficient documentation

## 2021-01-30 DIAGNOSIS — Z7984 Long term (current) use of oral hypoglycemic drugs: Secondary | ICD-10-CM | POA: Insufficient documentation

## 2021-01-30 DIAGNOSIS — Z7901 Long term (current) use of anticoagulants: Secondary | ICD-10-CM | POA: Insufficient documentation

## 2021-01-30 DIAGNOSIS — Z794 Long term (current) use of insulin: Secondary | ICD-10-CM | POA: Insufficient documentation

## 2021-01-30 DIAGNOSIS — M79604 Pain in right leg: Secondary | ICD-10-CM

## 2021-01-30 DIAGNOSIS — X58XXXA Exposure to other specified factors, initial encounter: Secondary | ICD-10-CM | POA: Insufficient documentation

## 2021-01-30 DIAGNOSIS — E119 Type 2 diabetes mellitus without complications: Secondary | ICD-10-CM | POA: Insufficient documentation

## 2021-01-30 DIAGNOSIS — S98311A Complete traumatic amputation of right midfoot, initial encounter: Secondary | ICD-10-CM | POA: Insufficient documentation

## 2021-01-30 LAB — BASIC METABOLIC PANEL
Anion gap: 8 (ref 5–15)
BUN: 18 mg/dL (ref 6–20)
CO2: 25 mmol/L (ref 22–32)
Calcium: 9 mg/dL (ref 8.9–10.3)
Chloride: 100 mmol/L (ref 98–111)
Creatinine, Ser: 0.64 mg/dL (ref 0.44–1.00)
GFR, Estimated: >60 mL/min (ref >60)
Glucose, Bld: 266 mg/dL — ABNORMAL HIGH (ref 70–99)
Potassium: 4 mmol/L (ref 3.5–5.1)
Sodium: 133 mmol/L — ABNORMAL LOW (ref 135–145)

## 2021-01-30 LAB — CBC WITH DIFFERENTIAL/PLATELET
Abs Immature Granulocytes: 0.04 10*3/uL (ref 0.00–0.07)
Basophils Absolute: 0 10*3/uL (ref 0.0–0.1)
Basophils Relative: 0 %
Eosinophils Absolute: 0.1 10*3/uL (ref 0.0–0.5)
Eosinophils Relative: 1 %
HCT: 37.6 % (ref 36.0–46.0)
Hemoglobin: 11.7 g/dL — ABNORMAL LOW (ref 12.0–15.0)
Immature Granulocytes: 0 %
Lymphocytes Relative: 21 %
Lymphs Abs: 1.9 10*3/uL (ref 0.7–4.0)
MCH: 26.5 pg (ref 26.0–34.0)
MCHC: 31.1 g/dL (ref 30.0–36.0)
MCV: 85.3 fL (ref 80.0–100.0)
Monocytes Absolute: 0.7 10*3/uL (ref 0.1–1.0)
Monocytes Relative: 8 %
Neutro Abs: 6.2 10*3/uL (ref 1.7–7.7)
Neutrophils Relative %: 70 %
Platelets: 295 10*3/uL (ref 150–400)
RBC: 4.41 MIL/uL (ref 3.87–5.11)
RDW: 14.4 % (ref 11.5–15.5)
WBC: 9.1 10*3/uL (ref 4.0–10.5)
nRBC: 0 % (ref 0.0–0.2)

## 2021-01-30 MED ORDER — APIXABAN 5 MG PO TABS
10.0000 mg | ORAL_TABLET | Freq: Once | ORAL | Status: AC
  Administered 2021-01-30: 10 mg via ORAL
  Filled 2021-01-30: qty 2

## 2021-01-30 MED ORDER — APIXABAN 5 MG PO TABS
ORAL_TABLET | ORAL | 0 refills | Status: DC
  Filled 2021-01-30: qty 74, 30d supply, fill #0

## 2021-01-30 NOTE — ED Notes (Signed)
Pt states she wants her picc line used for labs.

## 2021-01-30 NOTE — Progress Notes (Unsigned)
Right lower venous has been completed and is Positive for DVT in the popliteal, femoral and common femoral veins. Preliminary results can be found under CV proc through chart review.  [name] RVT [facility]

## 2021-01-30 NOTE — ED Provider Triage Note (Signed)
Emergency Medicine Provider Triage Evaluation Note  TANISHIA LEMASTER , a 41 y.o. female  was evaluated in triage.  Pt complains of DVT to right lower extremity.  Patient reports that she started having pain to right popliteal area on January 5.  Pain moved up her leg to right hip over that time.  Patient had DVT study done in the outpatient setting which showed deep vein thrombosis involving the right common femoral vein, right femoral vein, and right popliteal vein.  Patient states that she was advised by her cardiologist to come to the emergency department for admission.  Patient reports that she is currently receiving antibiotics via PICC line for osteomyelitis to right foot.  Review of Systems  Positive: Right lower extremity pain Negative: Swelling, chest pain, shortness of breath, hemoptysis  Physical Exam  BP 115/75 (BP Location: Left Arm)    Pulse 87    Temp 98.7 F (37.1 C) (Oral)    Resp 16    LMP 01/16/2021 (Approximate)    SpO2 100%  Gen:   Awake, no distress   Resp:  Normal effort  MSK:   Moves extremities without difficulty; no swelling to bilateral lower extremities.  Patient does have tenderness to right lower leg from calf to hip.  No deformity observed. Other:    Medical Decision Making  Medically screening exam initiated at 1:58 PM.  Appropriate orders placed.  Rosana Hoes was informed that the remainder of the evaluation will be completed by another provider, this initial triage assessment does not replace that evaluation, and the importance of remaining in the ED until their evaluation is complete.     Haskel Schroeder, PA-C 01/30/21 1400

## 2021-01-30 NOTE — ED Provider Notes (Signed)
[facility] EMERGENCY DEPARTMENT Provider Note    CSN: [id number] Arrival date & time: 01/30/21 1335  History Chief Complaint  Patient presents with   Leg Pain    [name] is a 41 y.o. female with history of DM is about 6 weeks s/p R mid foot amputation by Dr. Odis Hollingshead, [facility] on 12/5 for osteomyelitis. She was in his office today for suture removal and was complaining of some R calf pain which has been present for about a week or so. She denies any CP or SOB. She was sent for an outpatient doppler which was positive for DVT. She was instructed by Ortho to come to the ED 'to be admitted'. Her wound has had some issue with dehiscence but is healing slowly, denies any pain, drainage or smell.     Home Medications Prior to Admission medications   Medication Sig Start Date End Date Taking? Authorizing Provider  APIXABAN Everlene Balls) VTE STARTER PACK (10MG  AND 5MG ) Take as directed on package: start with two-5mg  tablets twice daily for 7 days. On day 8, switch to one-5mg  tablet twice daily. 01/30/21  Yes , MD  acetaminophen (TYLENOL) 325 MG tablet Take 2 tablets (650 mg total) by mouth every 6 (six) hours as needed for mild pain (or Fever >/= 101). 12/23/20   Pollyann Savoy, MD  gabapentin (NEURONTIN) 300 MG capsule Take 2 capsules by mouth 3 times daily. 12/23/20   Rodolph Bong, MD  Insulin Glargine [facility]) 100 UNIT/ML Inject 56 units into the skin daily. 01/17/21   [facility], NP  Insulin Pen Needle (PEN NEEDLES 3/16") 31G X 5 MM MISC 40 Units by Does not apply route 2 (two) times daily before a meal. 12/23/20   4/16, MD  lisinopril (ZESTRIL) 2.5 MG tablet Take 1 tablet by mouth daily 01/17/21   Rodolph Bong, NP  metFORMIN (GLUCOPHAGE-XR) 500 MG 24 hr tablet Take 1 tablet by mouth twice daily. 01/17/21   Grayce Sessions, NP  ondansetron (ZOFRAN) 4 MG tablet Take 1 tablet (4 mg total) by mouth every 6 (six) hours as  needed for nausea. 12/23/20   Grayce Sessions, MD  oxyCODONE (OXY IR/ROXICODONE) 5 MG immediate release tablet Take 1 tablet (5 mg total) by mouth every 3 (three) hours as needed for moderate pain. 12/23/20   Rodolph Bong, MD     Allergies    Mangifera indica   Review of Systems   Review of Systems Please see HPI for pertinent positives and negatives  Physical Exam BP 135/85    Pulse 98    Temp 98.7 F (37.1 C) (Oral)    Resp 18    LMP 01/16/2021 (Approximate)    SpO2 100%   Physical Exam Vitals and nursing note reviewed.  Constitutional:      Appearance: Normal appearance.  HENT:     Head: Normocephalic and atraumatic.     Nose: Nose normal.     Mouth/Throat:     Mouth: Mucous membranes are moist.  Eyes:     Extraocular Movements: Extraocular movements intact.     Conjunctiva/sclera: Conjunctivae normal.  Cardiovascular:     Rate and Rhythm: Normal rate.  Pulmonary:     Effort: Pulmonary effort is normal.     Breath sounds: Normal breath sounds.  Abdominal:     General: Abdomen is flat.     Palpations: Abdomen is soft.     Tenderness: There is no  abdominal tenderness.  Musculoskeletal:        General: Tenderness (R calf) present. No swelling. Normal range of motion.     Cervical back: Neck supple.     Right lower leg: Edema (mild) present.     Comments: S/p R midfoot amputation, healing wound with granulation tissue but no signs of infection  Skin:    General: Skin is warm and dry.  Neurological:     General: No focal deficit present.     Mental Status: She is alert.  Psychiatric:        Mood and Affect: Mood normal.    ED Results / Procedures / Treatments   EKG None  Procedures Procedures  Medications Ordered in the ED Medications  apixaban (ELIQUIS) tablet 10 mg (has no administration in time range)    Initial Impression and Plan  Outpatient doppler results reviewed:  Summary:  RIGHT:  - Findings consistent with acute deep vein  thrombosis involving the right  common femoral vein, right femoral vein, and right popliteal vein.  - No cystic structure found in the popliteal fossa.     LEFT:  - No evidence of deep vein thrombosis in the lower extremity. No indirect  evidence of obstruction proximal to the inguinal ligament.  - No cystic structure found in the popliteal fossa.   Will check basic labs, plan to start DOAC if normal renal function. Will discuss with Ortho regarding need for admission from their perspective. Patient aware that typically an isolated DVT finding without signs or symptoms of PE does not require admission.   ED Course   Clinical Course as of 01/30/21 2128  Tue Jan 30, 2021  2039 CBC is unremarkable.  [CS]  2111 BMP is unremarkable. Normal renal function. Will give first dose of Eliquis, send Starter Pack to [facility] to begin tomorrow. Advised to discuss manufacturer discount plan for subsequent prescriptions. Advised to return for any signs/symptoms of PE.  [CS]  2121 Spoke with Dr. Shelle Iron, Ortho, from his standpoint there is no indication for admission for the DVT. [CS]    Clinical Course User Index [CS] Pollyann Savoy, MD     MDM Rules/Calculators/A&P Medical Decision Making Problems Addressed: Acute deep vein thrombosis (DVT) of proximal vein of right lower extremity [facility]): acute illness or injury that poses a threat to life or bodily functions  Amount and/or Complexity of Data Reviewed External Data Reviewed: radiology. Labs: ordered. Decision-making details documented in ED Course.  Risk Prescription drug management. Decision regarding hospitalization.    Final Clinical Impression(s) / ED Diagnoses Final diagnoses:  Acute deep vein thrombosis (DVT) of proximal vein of right lower extremity (HCC)    Rx / DC Orders ED Discharge Orders          Ordered    APIXABAN (ELIQUIS) VTE STARTER PACK (10MG  AND 5MG )        01/30/21 2111             02/01/21,  MD 01/30/21 2128

## 2021-01-30 NOTE — ED Triage Notes (Signed)
Pt arrived via POV, states pain in right leg/thigh since 01/18/21 and found blood clot this morning.

## 2021-01-31 ENCOUNTER — Ambulatory Visit: Payer: Self-pay | Admitting: Infectious Diseases

## 2021-01-31 ENCOUNTER — Ambulatory Visit (INDEPENDENT_AMBULATORY_CARE_PROVIDER_SITE_OTHER): Payer: Self-pay | Admitting: *Deleted

## 2021-01-31 ENCOUNTER — Other Ambulatory Visit: Payer: Self-pay

## 2021-01-31 NOTE — Telephone Encounter (Signed)
Pt calling with additional questions regarding DVT. Answered questions to pt's satisfaction. Advised to NOTmassage leg, may get OOB, abstain from any activities that may cause bruising or bleeding. Pt verbalizes understanding. Advised to [facility] for any additional questions.

## 2021-01-31 NOTE — Telephone Encounter (Signed)
Summary: fu appt for ED visit   Pt requests hospital fu appt asap due to going to ED for a blood clot. The first available hospital fu appt is 02/13/21 but pt requests to be seen asap. Pt requests call back from a nurse or Gwinda Passe if possible. Cb# [phone]       Called patient to review request for earlier appt. Requesting if EKG and or additional CT scan needed to assure blood clot did not travel to other areas prior to 02/13/21 OV. Patient concerned and recommended to continue eliquis as ordered and to go to ED if any breathing difficulty noted. Patient requesting a call back if earlier appt can be scheduled.

## 2021-02-01 ENCOUNTER — Encounter (HOSPITAL_BASED_OUTPATIENT_CLINIC_OR_DEPARTMENT_OTHER): Payer: Medicaid Other | Admitting: Internal Medicine

## 2021-02-01 ENCOUNTER — Other Ambulatory Visit: Payer: Self-pay

## 2021-02-01 DIAGNOSIS — E11621 Type 2 diabetes mellitus with foot ulcer: Secondary | ICD-10-CM

## 2021-02-01 DIAGNOSIS — M869 Osteomyelitis, unspecified: Secondary | ICD-10-CM

## 2021-02-01 DIAGNOSIS — L97514 Non-pressure chronic ulcer of other part of right foot with necrosis of bone: Secondary | ICD-10-CM | POA: Diagnosis not present

## 2021-02-02 ENCOUNTER — Other Ambulatory Visit: Payer: Self-pay

## 2021-02-02 ENCOUNTER — Ambulatory Visit (INDEPENDENT_AMBULATORY_CARE_PROVIDER_SITE_OTHER): Payer: Medicaid Other | Admitting: Infectious Diseases

## 2021-02-02 ENCOUNTER — Encounter: Payer: Self-pay | Admitting: Infectious Diseases

## 2021-02-02 VITALS — BP 102/66 | HR 87 | Temp 97.6°F

## 2021-02-02 DIAGNOSIS — T8781 Dehiscence of amputation stump: Secondary | ICD-10-CM | POA: Diagnosis not present

## 2021-02-02 DIAGNOSIS — Z5181 Encounter for therapeutic drug level monitoring: Secondary | ICD-10-CM | POA: Diagnosis not present

## 2021-02-02 DIAGNOSIS — E11628 Type 2 diabetes mellitus with other skin complications: Secondary | ICD-10-CM

## 2021-02-02 DIAGNOSIS — E669 Obesity, unspecified: Secondary | ICD-10-CM

## 2021-02-02 DIAGNOSIS — M869 Osteomyelitis, unspecified: Secondary | ICD-10-CM

## 2021-02-02 DIAGNOSIS — E1165 Type 2 diabetes mellitus with hyperglycemia: Secondary | ICD-10-CM

## 2021-02-02 DIAGNOSIS — T8131XA Disruption of external operation (surgical) wound, not elsewhere classified, initial encounter: Secondary | ICD-10-CM | POA: Insufficient documentation

## 2021-02-02 DIAGNOSIS — T8131XD Disruption of external operation (surgical) wound, not elsewhere classified, subsequent encounter: Secondary | ICD-10-CM

## 2021-02-02 MED ORDER — AMOXICILLIN-POT CLAVULANATE 875-125 MG PO TABS
1.0000 | ORAL_TABLET | Freq: Two times a day (BID) | ORAL | 0 refills | Status: DC
  Filled 2021-02-02: qty 30, 15d supply, fill #0

## 2021-02-02 NOTE — Progress Notes (Signed)
Patient Active Problem List   Diagnosis Date Noted   Wound dehiscence 01/24/2021   Medication monitoring encounter 01/02/2021   Diabetic ulcer of right foot associated with diabetes mellitus due to underlying condition (HCC) 01/02/2021   Osteomyelitis of right foot (HCC)    Osteomyelitis of second toe of right foot (HCC) 12/16/2020   Type 2 diabetes mellitus with hyperglycemia (HCC) 12/16/2020   Class 3 obesity (HCC) 12/16/2020   Hyperlipidemia 12/16/2020   Soft tissue mass 09/15/2015    Patient's Medications  New Prescriptions   No medications on file  Previous Medications   ACETAMINOPHEN (TYLENOL) 325 MG TABLET    Take 2 tablets (650 mg total) by mouth every 6 (six) hours as needed for mild pain (or Fever >/= 101).   APIXABAN (ELIQUIS) 5 MG TABS TABLET    Start with two-5mg  tablets twice daily for 7 days. On day 8, switch to one-5mg  tablet twice daily.   GABAPENTIN (NEURONTIN) 300 MG CAPSULE    Take 2 capsules by mouth 3 times daily.   INSULIN GLARGINE (BASAGLAR KWIKPEN) 100 UNIT/ML    Inject 56 units into the skin daily.   INSULIN PEN NEEDLE (PEN NEEDLES 3/16") 31G X 5 MM MISC    40 Units by Does not apply route 2 (two) times daily before a meal.   LISINOPRIL (ZESTRIL) 2.5 MG TABLET    Take 1 tablet by mouth daily   METFORMIN (GLUCOPHAGE-XR) 500 MG 24 HR TABLET    Take 1 tablet by mouth twice daily.   ONDANSETRON (ZOFRAN) 4 MG TABLET    Take 1 tablet (4 mg total) by mouth every 6 (six) hours as needed for nausea.   OXYCODONE (OXY IR/ROXICODONE) 5 MG IMMEDIATE RELEASE TABLET    Take 1 tablet (5 mg total) by mouth every 3 (three) hours as needed for moderate pain.  Modified Medications   No medications on file  Discontinued Medications   No medications on file    Subjective: Here for follow-up in the setting of DFU and osteomyelitis of right foot.  Mother is also present. she is getting IV ceftriaxone through her right arm PICC line.  She has some irritation in the  right arm PICC site probably due to the bandage but denies any pain or tenderness or swelling. Seen by wound care yesterday, thick yellowish discharge noted while bandage was taken off.  Had debridement and cultures taken - wound did not look good per wound care. Wound cx 1/19 no growth to date. Saw Dr Odis Hollingshead on Monday - would was looking OK. IDenies fevers, chills, sweats. Denies nausea, vomiting and diarrhea.  Patient states he is having ongoing drainage since he had surgery but lately it has become yellow in color.  She is also working to control her blood sugars.   Seen in the ED on 1/17, found to have DVT in the rt leg and was discharged on [facility].   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   Procedure: TRANSMETATARSAL AMPUTATION;  Surgeon: Netta Cedars, MD;  Location: [facility];  Service: Orthopedics;  Laterality: Right;    Social History  Tobacco Use   Smoking status: Never  Vaping Use   Vaping Use: Never used  Substance Use Topics   Alcohol use: Yes    Comment: occ   Drug use: Yes    Types: Marijuana    Family History  Problem Relation Age of Onset   Hypertension Mother    Diabetes Mother    Diabetes Other     Allergies  Allergen Reactions   Mangifera Indica Rash    FRESH MANGO    Health Maintenance  Topic Date Due   FOOT EXAM  Never done   OPHTHALMOLOGY EXAM  Never done   Hepatitis C Screening  Never done   PAP SMEAR-Modifier  Never done   COVID-19 Vaccine (1) 02/02/2021 (Originally 12/07/1980)   INFLUENZA VACCINE  04/13/2021 (Originally 08/14/2020)   TETANUS/TDAP  01/17/2022 (Originally 06/06/2000)   HEMOGLOBIN A1C  06/16/2021    HIV Screening  Completed   HPV VACCINES  Aged Out    Objective: BP 102/66    Pulse 87    Temp 97.6 F (36.4 C) (Oral)    LMP 01/16/2021 (Approximate)    SpO2 99%    Physical Exam Constitutional:      Appearance: Normal appearance.  HENT:     Head: Normocephalic and atraumatic.      Mouth: Mucous membranes are moist.  Eyes:    Conjunctiva/sclera: Conjunctivae normal.     Pupils: Pupils are equal, round  Cardiovascular:     Rate and Rhythm: Normal rate and regular rhythm.     Heart sounds:   Pulmonary:     Effort: Pulmonary effort is normal.     Breath sounds:   Abdominal:     General:     Palpations: Abdomen is soft.   Musculoskeletal:      RT Foot        Skin:    General: hyperpigmentation of skin in the lower extremities     Comments:  Neurological:     General: No focal deficit present.     Mental Status: She is alert and oriented to person, place, and time.   Psychiatric:        Mood and Affect: Mood normal.   Lab Results Lab Results  Component Value Date   WBC 9.1 01/30/2021   HGB 11.7 (L) 01/30/2021   HCT 37.6 01/30/2021   MCV 85.3 01/30/2021   PLT 295 01/30/2021    Lab Results  Component Value Date   CREATININE 0.64 01/30/2021   BUN 18 01/30/2021   NA 133 (L) 01/30/2021   K 4.0 01/30/2021   CL 100 01/30/2021   CO2 25 01/30/2021    Lab Results  Component Value Date   ALT 8 01/17/2021   AST 15 01/17/2021   ALKPHOS 115 01/17/2021   BILITOT 0.3 01/17/2021    Lab Results  Component Value Date   CHOL 92 12/20/2020   HDL 13 (L) 12/20/2020   LDLCALC 58 12/20/2020   TRIG 103 12/20/2020   CHOLHDL 7.1 12/20/2020   No results found for: LABRPR, RPRTITER No results found for: HIV1RNAQUANT, HIV1RNAVL, CD4TABS   Problem List Items Addressed This Visit       Endocrine   Type 2 diabetes mellitus with hyperglycemia (HCC)     Musculoskeletal and Integument   Osteomyelitis of right foot (HCC)     Other   Medication monitoring  encounter   Wound dehiscence, surgical - Primary   Assessment/Plan DFU/osteomyelitis of second ray including necrosis and abscess -S/p right  foot transmetatarsal amputation, right foot I&D of deep abscess 12/5.  OR cultures Strep agalactiae - ABI -unremarkable for significant arterial disease   Wound dehiscence at the RT TMA site  Medication Monitoring Uncontrolled DM-A1c 13.5, follows with PCP Obesity - discussed about healthy life style   DC PICC line and IV ceftriaxone Start Augmentin 875/125 mg PO BID for 2 weeks given concerns of wound dehiscence at the Rt TMA site Fu wound cx Fu with wound care Fu with Ortho as soon as possible, she is going to call Ortho to be seen next week  Fu with [place] in 2 weeks Blood glucose control   I have personally spent 40  minutes involved in face-to-face and non-face-to-face activities for this patient on the day of the visit including coordinating of care and counseling of the patient   Victoriano Lain, MD [facility] 02/02/2021, 10:33 AM

## 2021-02-03 LAB — ACID FAST CULTURE WITH REFLEXED SENSITIVITIES (MYCOBACTERIA): Acid Fast Culture: NEGATIVE

## 2021-02-04 LAB — AEROBIC/ANAEROBIC CULTURE W GRAM STAIN (SURGICAL/DEEP WOUND)

## 2021-02-05 ENCOUNTER — Other Ambulatory Visit: Payer: Self-pay | Admitting: Infectious Diseases

## 2021-02-05 ENCOUNTER — Other Ambulatory Visit: Payer: Self-pay

## 2021-02-05 NOTE — Progress Notes (Signed)
Diagnosis: osteomyelitis  Allergies  Allergen Reactions   Mangifera Indica Rash    FRESH MANGO  Pt states she is not allergic to mango    OPAT Orders Discharge antibiotics to be given via PICC line Discharge antibiotics: ceftriaxone Per pharmacy protocol  End Date: 02/02/21  [facility] Care Per Protocol:  X__ Please pull PIC at completion of IV antibiotics __ Please leave PIC in place until doctor has seen patient or been notified  Fax weekly labs to [phone]  Clinic Follow Up Appt: 2 weeks

## 2021-02-05 NOTE — Progress Notes (Signed)
[name], [name] ([id number]) Visit Report for 02/01/2021 Chief Complaint Document Details Patient Name: Date of Service: [name] Millard[place] V [place], [place] [name] 02/01/2021 9:45 A M Medical Record Number: [id number] Patient Account Number: [id number] Date of Birth/Sex: Treating RN: 1980/11/20 (41 y.o. [name] LinkF) Lynch, Shatara Primary Care Provider: Gwinda PasseEdwards, Michelle Other Clinician: Referring Provider: Treating Provider/Extender: Grace IsaacHoffman, Artemisia Auvil Edwards, Michelle Weeks in Treatment: 1 Information Obtained from: Patient Chief Complaint Osteomyelitis of the right foot status post transmetatarsal amputation with surgical site dehiscence Electronic Signature(s) Signed: 02/01/2021 10:49:22 AM By: Geralyn CorwinHoffman, Chais Fehringer DO Entered By: Geralyn CorwinHoffman, Andyn Sales on 02/01/2021 10:43:06 -------------------------------------------------------------------------------- Debridement Details Patient Name: Date of Service: [name], [name] 02/01/2021 9:45 A M Medical Record Number: [id number] Patient Account Number: [id number] Date of Birth/Sex: Treating RN: 1980/11/20 (41 y.o. [name] LinkF) Lynch, Shatara Primary Care Provider: Gwinda PasseEdwards, Michelle Other Clinician: Referring Provider: Treating Provider/Extender: Grace IsaacHoffman, Earleen Aoun Edwards, Michelle Weeks in Treatment: 1 Debridement Performed for Assessment: Wound #1 Right Amputation Site - Transmetatarsal Performed By: Physician Geralyn CorwinHoffman, Yarielis Funaro, DO Debridement Type: Debridement Level of Consciousness (Pre-procedure): Awake and Alert Pre-procedure Verification/Time Out Yes - 10:30 Taken: Start Time: 10:30 T Area Debrided (L x W): otal 2 (cm) x 4 (cm) = 8 (cm) Tissue and other material debrided: Non-Viable, Eschar Level: Non-Viable Tissue Debridement Description: Selective/Open Wound Instrument: Blade, Forceps Bleeding: Minimum Hemostasis Achieved: Pressure End Time: 10:32 Procedural Pain: 0 Post Procedural Pain: 0 Response to Treatment: Procedure was tolerated well Level of Consciousness  (Post- Awake and Alert procedure): Post Debridement Measurements of Total Wound Length: (cm) 4.4 Width: (cm) 12 Depth: (cm) 1.4 Volume: (cm) 58.057 Character of Wound/Ulcer Post Debridement: Requires Further Debridement Post Procedure Diagnosis Same as Pre-procedure Electronic Signature(s) Signed: 02/01/2021 12:16:40 PM By: Geralyn CorwinHoffman, Dasean Brow DO Signed: 02/05/2021 5:11:23 PM By: Zandra AbtsLynch, Shatara RN, BSN Previous Signature: 02/01/2021 10:49:22 AM Version By: Geralyn CorwinHoffman, Lilyann Gravelle DO Entered By: Zandra AbtsLynch, Shatara on 02/01/2021 10:53:20 -------------------------------------------------------------------------------- HPI Details Patient Name: Date of Service: [name], [name] 02/01/2021 9:45 A M Medical Record Number: [id number] Patient Account Number: [id number] Date of Birth/Sex: Treating RN: 1980/11/20 (41 y.o. [name] LinkF) Lynch, Shatara Primary Care Provider: Gwinda PasseEdwards, Michelle Other Clinician: Referring Provider: Treating Provider/Extender: Grace IsaacHoffman, Dama Hedgepeth Edwards, Michelle Weeks in Treatment: 1 History of Present Illness HPI Description: Admission 01/22/2021 [name] is a 41 year old female with a past medical history of insulin-dependent uncontrolled type 2 diabetes with last hemoglobin A1c of 13.5, osteomyelitis of the right foot status post transmetatarsal amputation on 12/18/2020 that presents to the clinic for right foot wound. She has had dehiscence of the surgical site. She is currently using wet-to-dry dressings. She has a PICC line and receiving IV ceftriaxone daily for her osteomyelitis. There is an end date of 01/27/2021. She is also taking oral metronidazole. She currently denies systemic signs of infection. 1/19; patient presents for follow-up. She was diagnosed with a DVT to the right lower extremity 2 days ago. She is on Eliquis now. She is scheduled to see her infectious disease doctor tomorrow. She has been using Dakin's wet-to-dry dressings. She denies systemic signs of  infection. Electronic Signature(s) Signed: 02/01/2021 10:49:22 AM By: Geralyn CorwinHoffman, Jemell Town DO Entered By: Geralyn CorwinHoffman, Toyna Erisman on 02/01/2021 10:44:08 -------------------------------------------------------------------------------- Physical Exam Details Patient Name: Date of Service: [name], [name] 02/01/2021 9:45 A M Medical Record Number: [id number] Patient Account Number: [id number] Date of Birth/Sex: Treating RN: 1980/11/20 (41 y.o. [name] LinkF) Lynch, Shatara Primary Care Provider: Gwinda PasseEdwards, Michelle Other Clinician: Referring Provider: Treating Provider/Extender: Grace IsaacHoffman, Uzziel Russey Edwards, Michelle Weeks in  Treatment: 1 Constitutional respirations regular, non-labored and within target range for patient.. Cardiovascular 2+ dorsalis pedis/posterior tibialis pulses. Psychiatric pleasant and cooperative. Notes Right foot: Transmetatarsal surgical wound site No longer has stitches. It is completely dehisced. There is probing to bone. There is eschar and nonviable tissue throughout the wound bed. Purulent drainage on palpation. Electronic Signature(s) Signed: 02/01/2021 10:49:22 AM By: Geralyn Corwin DO Entered By: Geralyn Corwin on 02/01/2021 10:45:14 -------------------------------------------------------------------------------- Physician Orders Details Patient Name: Date of Service: [name], [name] 02/01/2021 9:45 A M Medical Record Number: [id number] Patient Account Number: [id number] Date of Birth/Sex: Treating RN: [birth year] (41 y.o. [name] Primary Care Provider: Gwinda Passe Other Clinician: Referring Provider: Treating Provider/Extender: Grace Isaac in Treatment: 1 Verbal / Phone Orders: No Diagnosis Coding ICD-10 Coding Code Description L97.514 Non-pressure chronic ulcer of other part of right foot with necrosis of bone E11.621 Type 2 diabetes mellitus with foot ulcer M86.9 Osteomyelitis, unspecified Follow-up  Appointments ppointment in 1 week. - with Dr. Mikey Bussing Return A Bathing/ Shower/ Hygiene Other Bathing/Shower/Hygiene Orders/Instructions: - Clean with saline or Dakins Edema Control - Lymphedema / SCD / Other Elevate legs to the level of the heart or above for 30 minutes daily and/or when sitting, a frequency of: - throughout the day Avoid standing for long periods of time. Moisturize legs daily. Additional Orders / Instructions Follow Nutritious Diet - -Monitor/Control Blood Sugar -High Protein Diet Wound Treatment Wound #1 - Amputation Site - Transmetatarsal Wound Laterality: Right Cleanser: Normal Saline 1 x Per Day/30 Days Discharge Instructions: Cleanse the wound with Normal Saline prior to applying a clean dressing using gauze sponges, not tissue or cotton balls. Topical: Dakin's solution 1 x Per Day/30 Days Discharge Instructions: Moisten Gauze with Dakins Secondary Dressing: Woven Gauze Sponge, Non-Sterile 4x4 in 1 x Per Day/30 Days Discharge Instructions: Apply over primary dressing as directed. Secondary Dressing: ABD Pad, 5x9 1 x Per Day/30 Days Discharge Instructions: Apply over primary dressing as directed. Secured With: [employer], 4.5x3.1 (in/yd) 1 x Per Day/30 Days Discharge Instructions: Secure with Kerlix as directed. Secured With: Transpore Surgical Tape, 2x10 (in/yd) 1 x Per Day/30 Days Discharge Instructions: Secure dressing with tape as directed. Laboratory naerobe culture (MICRO) - Right transmet - (ICD10 E11.621 - Type 2 diabetes mellitus with foot Bacteria identified in Unspecified specimen by A ulcer) LOINC Code: 635-3 Convenience Name: Anerobic culture Electronic Signature(s) Signed: 02/01/2021 10:49:22 AM By: Geralyn Corwin DO Entered By: Geralyn Corwin on 02/01/2021 10:45:38 -------------------------------------------------------------------------------- Problem List Details Patient Name: Date of Service: [name], [name] 02/01/2021  9:45 A M Medical Record Number: [id number] Patient Account Number: [id number] Date of Birth/Sex: Treating RN: 27-Mar-1980 (41 y.o. [name] Primary Care Provider: Gwinda Passe Other Clinician: Referring Provider: Treating Provider/Extender: Grace Isaac in Treatment: 1 Active Problems ICD-10 Encounter Code Description Active Date MDM Diagnosis L97.514 Non-pressure chronic ulcer of other part of right foot with necrosis of bone 01/22/2021 No Yes E11.621 Type 2 diabetes mellitus with foot ulcer 01/22/2021 No Yes M86.9 Osteomyelitis, unspecified 01/22/2021 No Yes Inactive Problems Resolved Problems Electronic Signature(s) Signed: 02/01/2021 10:49:22 AM By: Geralyn Corwin DO Entered By: Geralyn Corwin on 02/01/2021 10:42:39 -------------------------------------------------------------------------------- Progress Note Details Patient Name: Date of Service: [name], [name] 02/01/2021 9:45 A M Medical Record Number: [id number] Patient Account Number: [id number] Date of Birth/Sex: Treating RN: 04/03/1980 (41 y.o. [name] Primary Care Provider: Gwinda Passe Other Clinician: Referring Provider:  Treating Provider/Extender: Tobie Poet Weeks in Treatment: 1 Subjective Chief Complaint Information obtained from Patient Osteomyelitis of the right foot status post transmetatarsal amputation with surgical site dehiscence History of Present Illness (HPI) Admission 01/22/2021 Ms. Danyele Smejkal is a 41 year old female with a past medical history of insulin-dependent uncontrolled type 2 diabetes with last hemoglobin A1c of 13.5, osteomyelitis of the right foot status post transmetatarsal amputation on 12/18/2020 that presents to the clinic for right foot wound. She has had dehiscence of the surgical site. She is currently using wet-to-dry dressings. She has a PICC line and receiving IV ceftriaxone daily for her osteomyelitis.  There is an end date of 01/27/2021. She is also taking oral metronidazole. She currently denies systemic signs of infection. 1/19; patient presents for follow-up. She was diagnosed with a DVT to the right lower extremity 2 days ago. She is on Eliquis now. She is scheduled to see her infectious disease doctor tomorrow. She has been using Dakin's wet-to-dry dressings. She denies systemic signs of infection. Patient History Information obtained from Patient. Family History Cancer - Paternal Grandparents, Diabetes - Mother, Hypertension - Mother, Stroke - Maternal Grandparents, No family history of Heart Disease, Hereditary Spherocytosis, Kidney Disease, Lung Disease, Seizures, Thyroid Problems, Tuberculosis. Social History Never smoker, Marital Status - Single, Alcohol Use - Rarely, Drug Use - Prior History - Marijuana, Caffeine Use - Daily. Medical History Cardiovascular Patient has history of Hypertension Endocrine Patient has history of Type II Diabetes Musculoskeletal Patient has history of Osteomyelitis - Right Transmet 12/18/20 Neurologic Patient has history of Neuropathy Objective Constitutional respirations regular, non-labored and within target range for patient.. Vitals Time Taken: 9:55 AM, Height: 69 in, Temperature: 98.2 F, Pulse: 96 bpm, Respiratory Rate: 16 breaths/min, Blood Pressure: 108/74 mmHg. Cardiovascular 2+ dorsalis pedis/posterior tibialis pulses. Psychiatric pleasant and cooperative. General Notes: Right foot: Transmetatarsal surgical wound site No longer has stitches. It is completely dehisced. There is probing to bone. There is eschar and nonviable tissue throughout the wound bed. Purulent drainage on palpation. Integumentary (Hair, Skin) Wound #1 status is Open. Original cause of wound was Surgical Injury. The date acquired was: 12/15/2020. The wound has been in treatment 1 weeks. The wound is located on the Right Amputation Site - Transmetatarsal. The wound  measures 2.6cm length x 5.8cm width x 1.4cm depth; 11.844cm^2 area and 16.581cm^3 volume. There is Fat Layer (Subcutaneous Tissue) exposed. Tunneling has been noted at 12:00 with a maximum distance of 1.6cm. There is additional tunneling and at 10:00 with a maximum distance of 1.4cm. Undermining begins at 6:00 and ends at 8:00 with a maximum distance of 0.3cm. There is a medium amount of serosanguineous drainage noted. The wound margin is fibrotic, thickened scar. There is small (1-33%) red granulation within the wound bed. There is a large (67-100%) amount of necrotic tissue within the wound bed including Eschar and Adherent Slough. Assessment Active Problems ICD-10 Non-pressure chronic ulcer of other part of right foot with necrosis of bone Type 2 diabetes mellitus with foot ulcer Osteomyelitis, unspecified Patient's wound is stable. There was however purulent drainage on exam. This was cultured. I also debrided nonviable tissue. She is on IV ceftriaxone and oral metronidazole And follows up with infectious disease tomorrow. Vitals are stable. I recommended continuing Dakin's wet-to-dry. I will await culture results before making any antibiotic changes/decisions. Patient is high risk for BKA due to uncontrolled glucose levels. She states she is working on getting these down. Last hemoglobin A1c was 13. Procedures Wound #1 Pre-procedure  diagnosis of Wound #1 is a T be determined located on the Right Amputation Site - Transmetatarsal . There was a Selective/Open Wound Non- o Viable Tissue Debridement with a total area of 8 sq cm performed by Geralyn Corwin, DO. With the following instrument(s): Blade, and Forceps to remove Non-Viable tissue/material. Material removed includes Eschar. No specimens were taken. A time out was conducted at 10:30, prior to the start of the procedure. A Minimum amount of bleeding was controlled with Pressure. The procedure was tolerated well with a pain level of 0  throughout and a pain level of 0 following the procedure. Post Debridement Measurements: 2.6cm length x 5.8cm width x 1.4cm depth; 16.581cm^3 volume. Character of Wound/Ulcer Post Debridement requires further debridement. Post procedure Diagnosis Wound #1: Same as Pre-Procedure Plan Follow-up Appointments: Return Appointment in 1 week. - with Dr. Mikey Bussing Bathing/ Shower/ Hygiene: Other Bathing/Shower/Hygiene Orders/Instructions: - Clean with saline or Dakins Edema Control - Lymphedema / SCD / Other: Elevate legs to the level of the heart or above for 30 minutes daily and/or when sitting, a frequency of: - throughout the day Avoid standing for long periods of time. Moisturize legs daily. Additional Orders / Instructions: Follow Nutritious Diet - -Monitor/Control Blood Sugar -High Protein Diet Laboratory ordered were: Anerobic culture - Right transmet WOUND #1: - Amputation Site - Transmetatarsal Wound Laterality: Right Cleanser: Normal Saline 1 x Per Day/30 Days Discharge Instructions: Cleanse the wound with Normal Saline prior to applying a clean dressing using gauze sponges, not tissue or cotton balls. Topical: Dakin's solution 1 x Per Day/30 Days Discharge Instructions: Moisten Gauze with Dakins Secondary Dressing: Woven Gauze Sponge, Non-Sterile 4x4 in 1 x Per Day/30 Days Discharge Instructions: Apply over primary dressing as directed. Secondary Dressing: ABD Pad, 5x9 1 x Per Day/30 Days Discharge Instructions: Apply over primary dressing as directed. Secured With: [employer], 4.5x3.1 (in/yd) 1 x Per Day/30 Days Discharge Instructions: Secure with Kerlix as directed. Secured With: Transpore Surgical T ape, 2x10 (in/yd) 1 x Per Day/30 Days Discharge Instructions: Secure dressing with tape as directed. 1. In office sharp debridement 2. Dakin's wet-to-dry 3. Wound culture Electronic Signature(s) Signed: 02/01/2021 10:49:22 AM By: Geralyn Corwin DO Entered By: Geralyn Corwin on 02/01/2021 10:48:49 -------------------------------------------------------------------------------- HxROS Details Patient Name: Date of Service: [name], [name] 02/01/2021 9:45 A M Medical Record Number: [id number] Patient Account Number: [id number] Date of Birth/Sex: Treating RN: [birth year] (41 y.o. [name] Primary Care Provider: Gwinda Passe Other Clinician: Referring Provider: Treating Provider/Extender: Grace Isaac in Treatment: 1 Information Obtained From Patient Cardiovascular Medical History: Positive for: Hypertension Endocrine Medical History: Positive for: Type II Diabetes Time with diabetes: Dx 2009 Treated with: Insulin, Oral agents Blood sugar tested every day: Yes Tested : daily Musculoskeletal Medical History: Positive for: Osteomyelitis - Right Transmet 12/18/20 Neurologic Medical History: Positive for: Neuropathy Immunizations Pneumococcal Vaccine: Received Pneumococcal Vaccination: No Implantable Devices Yes Family and Social History Cancer: Yes - Paternal Grandparents; Diabetes: Yes - Mother; Heart Disease: No; Hereditary Spherocytosis: No; Hypertension: Yes - Mother; Kidney Disease: No; Lung Disease: No; Seizures: No; Stroke: Yes - Maternal Grandparents; Thyroid Problems: No; Tuberculosis: No; Never smoker; Marital Status - Single; Alcohol Use: Rarely; Drug Use: Prior History - Marijuana; Caffeine Use: Daily; Financial Concerns: No; Food, Clothing or Shelter Needs: No; Support System Lacking: No; Transportation Concerns: No Electronic Signature(s) Signed: 02/01/2021 10:49:22 AM By: Geralyn Corwin DO Signed: 02/05/2021 5:11:23 PM By: Zandra Abts RN, BSN Entered By: Mikey Bussing,  Desi Carby on 02/01/2021 10:44:19 -------------------------------------------------------------------------------- SuperBill Details Patient Name: Date of Service: [name], [place] [name] 02/01/2021 Medical Record Number:  [id number] Patient Account Number: [id number] Date of Birth/Sex: Treating RN: [birth year] (41 y.o. [name] LinkF) Lynch, Shatara Primary Care Provider: Gwinda PasseEdwards, Michelle Other Clinician: Referring Provider: Treating Provider/Extender: Grace IsaacHoffman, Zenith Lamphier Edwards, Michelle Weeks in Treatment: 1 Diagnosis Coding ICD-10 Codes Code Description [phone] Non-pressure chronic ulcer of other part of right foot with necrosis of bone E11.621 Type 2 diabetes mellitus with foot ulcer M86.9 Osteomyelitis, unspecified Facility Procedures CPT4 Code: [id number] Description: [id number] - DEBRIDE WOUND 1ST 20 SQ CM OR < ICD-10 Diagnosis Description L97.514 Non-pressure chronic ulcer of other part of right foot with necrosis of bone E11.621 Type 2 diabetes mellitus with foot ulcer M86.9 Osteomyelitis, unspecified Modifier: Quantity: 1 Physician Procedures Electronic Signature(s) Signed: 02/01/2021 10:49:22 AM By: Geralyn CorwinHoffman, Morley Gaumer DO Entered By: Geralyn CorwinHoffman, Mildred Tuccillo on 02/01/2021 10:49:05

## 2021-02-05 NOTE — Progress Notes (Signed)
Mo, Saya L. ([id number]) Visit Report for 02/01/2021 Arrival Information Details Patient Name: Date of Service: [name] [name], [place] [name] 02/01/2021 9:45 A M Medical Record Number: [id number] Patient Account Number: [id number] Date of Birth/Sex: Treating RN: September 22, 1980 (41 y.o. Katrinka Blazing Primary Care Tykia Mellone: Gwinda Passe Other Clinician: Referring Kanna Dafoe: Treating Abi Shoults/Extender: Grace Isaac in Treatment: 1 Visit Information History Since Last Visit Added or deleted any medications: Yes Patient Arrived: Wheel Chair Any new allergies or adverse reactions: No Arrival Time: 09:46 Had a fall or experienced change in No Accompanied By: self activities of daily living that may affect Transfer Assistance: None risk of falls: Patient Identification Verified: Yes Signs or symptoms of abuse/neglect since last visito No Patient Requires Transmission-Based No Hospitalized since last visit: No Precautions: Implantable device outside of the clinic excluding No Patient Has Alerts: Yes cellular tissue based products placed in the center Patient Alerts: Patient on Blood Thinner since last visit: PICC R Arm Has Dressing in Place as Prescribed: Yes ABI 12/17/20 R=1.08 Pain Present Now: No L=1.13 Electronic Signature(s) Signed: 02/05/2021 5:11:23 PM By: Zandra Abts RN, BSN Entered By: Zandra Abts on 02/01/2021 10:34:04 -------------------------------------------------------------------------------- Encounter Discharge Information Details Patient Name: Date of Service: [name] V IS, [name] [name] 02/01/2021 9:45 A M Medical Record Number: [id number] Patient Account Number: [id number] Date of Birth/Sex: Treating RN: 09-Jul-1980 (41 y.o. Wynelle Link Primary Care Zyiere Rosemond: Gwinda Passe Other Clinician: Referring Tawn Fitzner: Treating Saxton Chain/Extender: Grace Isaac in Treatment: 1 Encounter Discharge Information  Items Post Procedure Vitals Discharge Condition: Stable Temperature (F): 98.2 Ambulatory Status: Wheelchair Pulse (bpm): 96 Discharge Destination: Home Respiratory Rate (breaths/min): 16 Transportation: Private Auto Blood Pressure (mmHg): 108/74 Accompanied By: mother Schedule Follow-up Appointment: Yes Clinical Summary of Care: Patient Declined Electronic Signature(s) Signed: 02/05/2021 5:11:23 PM By: Zandra Abts RN, BSN Entered By: Zandra Abts on 02/01/2021 12:06:50 -------------------------------------------------------------------------------- Lower Extremity Assessment Details Patient Name: Date of Service: [name] V IS, [name] [name] 02/01/2021 9:45 A M Medical Record Number: [id number] Patient Account Number: [id number] Date of Birth/Sex: Treating RN: [birth year] (41 y.o. Katrinka Blazing Primary Care Song Myre: Gwinda Passe Other Clinician: Referring Tarahji Ramthun: Treating Sriya Kroeze/Extender: Tobie Poet Weeks in Treatment: 1 Edema Assessment Assessed: [Left: No] [Right: No] E[Left: dema] [Right: :] Calf Left: Right: Point of Measurement: 34 cm From Medial Instep 42.5 cm Ankle Left: Right: Point of Measurement: 8 cm From Medial Instep 26.2 cm Electronic Signature(s) Signed: 02/01/2021 5:49:01 PM By: Karie Schwalbe RN Entered By: Karie Schwalbe on 02/01/2021 10:02:17 -------------------------------------------------------------------------------- Multi Wound Chart Details Patient Name: Date of Service: [name] V IS, [name] [name] 02/01/2021 9:45 A M Medical Record Number: [id number] Patient Account Number: [id number] Date of Birth/Sex: Treating RN: 04-01-1980 (41 y.o. Wynelle Link Primary Care Shaley Leavens: Gwinda Passe Other Clinician: Referring Redford Behrle: Treating Layn Kye/Extender: Grace Isaac in Treatment: 1 Vital Signs Height(in): 69 Pulse(bpm): 96 Weight(lbs): Blood Pressure(mmHg): 108/74 Body Mass  Index(BMI): Temperature(F): 98.2 Respiratory Rate(breaths/min): 16 Photos: [N/A:N/A] Right Amputation Site - N/A N/A Wound Location: Transmetatarsal Surgical Injury N/A N/A Wounding Event: T be determined o N/A N/A Primary Etiology: Hypertension, Type II Diabetes, N/A N/A Comorbid History: Osteomyelitis, Neuropathy 12/15/2020 N/A N/A Date Acquired: 1 N/A N/A Weeks of Treatment: Open N/A N/A Wound Status: 2.6x5.8x1.4 N/A N/A Measurements L x W x D (cm) 11.844 N/A N/A A (cm) : rea 16.581 N/A N/A Volume (cm) : 72.20% N/A N/A % Reduction in A rea: 75.60% N/A N/A %  Reduction in Volume: 12 Position 1 (o'clock): 1.6 Maximum Distance 1 (cm): 10 Position 2 (o'clock): 1.4 Maximum Distance 2 (cm): 6 Starting Position 1 (o'clock): 8 Ending Position 1 (o'clock): 0.3 Maximum Distance 1 (cm): Yes N/A N/A Tunneling: Yes N/A N/A Undermining: Full Thickness Without Exposed N/A N/A Classification: Support Structures Medium N/A N/A Exudate A mount: Serosanguineous N/A N/A Exudate Type: red, brown N/A N/A Exudate Color: Fibrotic scar, thickened scar N/A N/A Wound Margin: Small (1-33%) N/A N/A Granulation A mount: Red N/A N/A Granulation Quality: Large (67-100%) N/A N/A Necrotic A mount: Eschar, Adherent Slough N/A N/A Necrotic Tissue: Fat Layer (Subcutaneous Tissue): Yes N/A N/A Exposed Structures: Fascia: No Tendon: No Muscle: No Joint: No Bone: No None N/A N/A Epithelialization: Debridement - Selective/Open Wound N/A N/A Debridement: Pre-procedure Verification/Time Out 10:30 N/A N/A Taken: Necrotic/Eschar N/A N/A Tissue Debrided: Non-Viable Tissue N/A N/A Level: 8 N/A N/A Debridement A (sq cm): rea Blade, Forceps N/A N/A Instrument: Minimum N/A N/A Bleeding: Pressure N/A N/A Hemostasis A chieved: 0 N/A N/A Procedural Pain: 0 N/A N/A Post Procedural Pain: Procedure was tolerated well N/A N/A Debridement Treatment  Response: 2.6x5.8x1.4 N/A N/A Post Debridement Measurements L x W x D (cm) 16.581 N/A N/A Post Debridement Volume: (cm) Debridement N/A N/A Procedures Performed: Treatment Notes Electronic Signature(s) Signed: 02/01/2021 10:49:22 AM By: Geralyn Corwin DO Signed: 02/05/2021 5:11:23 PM By: Zandra Abts RN, BSN Entered By: Geralyn Corwin on 02/01/2021 10:42:48 -------------------------------------------------------------------------------- Multi-Disciplinary Care Plan Details Patient Name: Date of Service: [name] V IS, [name] [name] 02/01/2021 9:45 A M Medical Record Number: [id number] Patient Account Number: [id number] Date of Birth/Sex: Treating RN: 06/18/1980 (41 y.o. Wynelle Link Primary Care Idonia Zollinger: Gwinda Passe Other Clinician: Referring Sequoia Witz: Treating Raine Elsass/Extender: Grace Isaac in Treatment: 1 Active Inactive Nutrition Nursing Diagnoses: Impaired glucose control: actual or potential Goals: Patient/caregiver verbalizes understanding of need to maintain therapeutic glucose control per primary care physician Date Initiated: 01/22/2021 Target Resolution Date: 02/19/2021 Goal Status: Active Interventions: Assess HgA1c results as ordered upon admission and as needed Provide education on elevated blood sugars and impact on wound healing Treatment Activities: Obtain HgA1c : 01/22/2021 Notes: Wound/Skin Impairment Nursing Diagnoses: Impaired tissue integrity Goals: Patient/caregiver will verbalize understanding of skin care regimen Date Initiated: 01/22/2021 Target Resolution Date: 02/19/2021 Goal Status: Active Ulcer/skin breakdown will have a volume reduction of 30% by week 4 Date Initiated: 01/22/2021 Target Resolution Date: 02/19/2021 Goal Status: Active Interventions: Assess patient/caregiver ability to obtain necessary supplies Assess patient/caregiver ability to perform ulcer/skin care regimen upon admission and as  needed Assess ulceration(s) every visit Provide education on ulcer and skin care Treatment Activities: Topical wound management initiated : 01/22/2021 Notes: Electronic Signature(s) Signed: 02/05/2021 5:11:23 PM By: Zandra Abts RN, BSN Entered By: Zandra Abts on 02/01/2021 10:27:59 -------------------------------------------------------------------------------- Pain Assessment Details Patient Name: Date of Service: [name] [name], [name] [name] 02/01/2021 9:45 A M Medical Record Number: [id number] Patient Account Number: [id number] Date of Birth/Sex: Treating RN: [birth year] (41 y.o. Katrinka Blazing Primary Care Mycala Warshawsky: Gwinda Passe Other Clinician: Referring Haddie Bruhl: Treating Kayliana Codd/Extender: Grace Isaac in Treatment: 1 Active Problems Location of Pain Severity and Description of Pain Patient Has Paino No Site Locations Pain Management and Medication Current Pain Management: Electronic Signature(s) Signed: 02/01/2021 5:49:01 PM By: Karie Schwalbe RN Entered By: Karie Schwalbe on 02/01/2021 09:55:38 -------------------------------------------------------------------------------- Patient/Caregiver Education Details Patient Name: Date of Service: [name] V IS, [name] [name] 1/19/2023andnbsp9:45 A M Medical Record Number: [id number] Patient Account Number:  [id number] Date of Birth/Gender: Treating RN: 05/09/1980 (41 y.o. Wynelle LinkF) Lynch, Shatara Primary Care Physician: Gwinda PasseEdwards, Michelle Other Clinician: Referring Physician: Treating Physician/Extender: Grace IsaacHoffman, Jessica Edwards, Michelle Weeks in Treatment: 1 Education Assessment Education Provided To: Patient Education Topics Provided Wound/Skin Impairment: Methods: Explain/Verbal Responses: State content correctly Electronic Signature(s) Signed: 02/05/2021 5:11:23 PM By: Zandra AbtsLynch, Shatara RN, BSN Entered By: Zandra AbtsLynch, Shatara on 02/01/2021  10:28:26 -------------------------------------------------------------------------------- Wound Assessment Details Patient Name: Date of Service: [name] V IS, [name] [name] 02/01/2021 9:45 A M Medical Record Number: [id number] Patient Account Number: [id number] Date of Birth/Sex: Treating RN: 05/09/1980 (41 y.o. Wynelle LinkF) Lynch, Shatara Primary Care Keyoni Lapinski: Gwinda PasseEdwards, Michelle Other Clinician: Referring Kalise Fickett: Treating Levester Waldridge/Extender: Tobie PoetHoffman, Jessica Edwards, Michelle Weeks in Treatment: 1 Wound Status Wound Number: 1 Primary Etiology: T be determined o Wound Location: Right Amputation Site - Transmetatarsal Wound Status: Open Wounding Event: Surgical Injury Comorbid Hypertension, Type II Diabetes, Osteomyelitis, History: Neuropathy Date Acquired: 12/15/2020 Weeks Of Treatment: 1 Clustered Wound: No Photos Wound Measurements Length: (cm) 4.4 Width: (cm) 12 Depth: (cm) 1.4 Area: (cm) 41.469 Volume: (cm) 58.057 % Reduction in Area: 2.5% % Reduction in Volume: 14.7% Epithelialization: None Tunneling: Yes Location 1 Position (o'clock): 12 Maximum Distance: (cm) 1.6 Location 2 Position (o'clock): 10 Maximum Distance: (cm) 1.4 Undermining: Yes Starting Position (o'clock): 6 Ending Position (o'clock): 8 Maximum Distance: (cm) 0.3 Wound Description Classification: Full Thickness Without Exposed Support Structures Wound Margin: Fibrotic scar, thickened scar Exudate Amount: Medium Exudate Type: Serosanguineous Exudate Color: red, brown Foul Odor After Cleansing: No Slough/Fibrino Yes Wound Bed Granulation Amount: Small (1-33%) Exposed Structure Granulation Quality: Red Fascia Exposed: No Necrotic Amount: Large (67-100%) Fat Layer (Subcutaneous Tissue) Exposed: Yes Necrotic Quality: Eschar Tendon Exposed: No Muscle Exposed: No Joint Exposed: No Bone Exposed: No Treatment Notes Wound #1 (Amputation Site - Transmetatarsal) Wound Laterality: Right Cleanser Normal  Saline Discharge Instruction: Cleanse the wound with Normal Saline prior to applying a clean dressing using gauze sponges, not tissue or cotton balls. Peri-Wound Care Topical Dakin's solution Discharge Instruction: Moisten Gauze with Dakins Primary Dressing Secondary Dressing Woven Gauze Sponge, Non-Sterile 4x4 in Discharge Instruction: Apply over primary dressing as directed. ABD Pad, 5x9 Discharge Instruction: Apply over primary dressing as directed. Secured With [employer]erlix Roll Sterile, 4.5x3.1 (in/yd) Discharge Instruction: Secure with Kerlix as directed. Transpore Surgical Tape, 2x10 (in/yd) Discharge Instruction: Secure dressing with tape as directed. Compression Wrap Compression Stockings Add-Ons Electronic Signature(s) Signed: 02/05/2021 5:11:23 PM By: Zandra AbtsLynch, Shatara RN, BSN Entered By: Zandra AbtsLynch, Shatara on 02/01/2021 10:51:41 -------------------------------------------------------------------------------- Vitals Details Patient Name: Date of Service: [name] V IS, [name] [name] 02/01/2021 9:45 A M Medical Record Number: [id number] Patient Account Number: [id number] Date of Birth/Sex: Treating RN: 05/09/1980 (41 y.o. Katrinka BlazingF) Scotton, Joanne Primary Care Deyana Wnuk: Gwinda PasseEdwards, Michelle Other Clinician: Referring Dessie Tatem: Treating Lylee Corrow/Extender: Grace IsaacHoffman, Jessica Edwards, Michelle Weeks in Treatment: 1 Vital Signs Time Taken: 09:55 Temperature (F): 98.2 Height (in): 69 Pulse (bpm): 96 Respiratory Rate (breaths/min): 16 Blood Pressure (mmHg): 108/74 Reference Range: 80 - 120 mg / dl Electronic Signature(s) Signed: 02/01/2021 5:49:01 PM By: Karie SchwalbeScotton, Joanne RN Entered By: Karie SchwalbeScotton, Joanne on 02/01/2021 09:55:29

## 2021-02-07 ENCOUNTER — Ambulatory Visit: Payer: Self-pay | Admitting: Infectious Diseases

## 2021-02-07 NOTE — Progress Notes (Signed)
Per Abby with [facility] they have received pull picc orders for the patient.  [name]

## 2021-02-08 ENCOUNTER — Other Ambulatory Visit: Payer: Self-pay

## 2021-02-08 ENCOUNTER — Encounter (HOSPITAL_BASED_OUTPATIENT_CLINIC_OR_DEPARTMENT_OTHER): Payer: Medicaid Other | Admitting: Internal Medicine

## 2021-02-08 DIAGNOSIS — L97514 Non-pressure chronic ulcer of other part of right foot with necrosis of bone: Secondary | ICD-10-CM

## 2021-02-08 NOTE — Progress Notes (Signed)
Mangual, Chana L. ([id number]) Visit Report for 02/08/2021 Chief Complaint Document Details Patient Name: Date of Service: [name], [name] [name] 02/08/2021 9:45 A M Medical Record Number: [id number] Patient Account Number: [id number] Date of Birth/Sex: Treating RN: [birth year] (41 y.o. Nancy Fetter Primary Care Provider: Juluis Mire Other Clinician: Referring Provider: Treating Provider/Extender: Edmonia Lynch in Treatment: 2 Information Obtained from: Patient Chief Complaint Osteomyelitis of the right foot status post transmetatarsal amputation with surgical site dehiscence Electronic Signature(s) Signed: 02/08/2021 10:45:57 AM By: Kalman Shan DO Entered By: Kalman Shan on 02/08/2021 10:38:02 -------------------------------------------------------------------------------- Debridement Details Patient Name: Date of Service: [name], [name] 02/08/2021 9:45 A M Medical Record Number: [id number] Patient Account Number: [id number] Date of Birth/Sex: Treating RN: [birth year] (41 y.o. Nancy Fetter Primary Care Provider: Juluis Mire Other Clinician: Referring Provider: Treating Provider/Extender: Edmonia Lynch in Treatment: 2 Debridement Performed for Assessment: Wound #1 Right Amputation Site - Transmetatarsal Performed By: Physician Kalman Shan, DO Debridement Type: Debridement Severity of Tissue Pre Debridement: Fat layer exposed Level of Consciousness (Pre-procedure): Awake and Alert Pre-procedure Verification/Time Out Yes - 10:15 Taken: Start Time: 10:15 T Area Debrided (L x W): otal 4 (cm) x 2 (cm) = 8 (cm) Tissue and other material debrided: Non-Viable, Callus, Eschar, Skin: Epidermis Level: Skin/Epidermis Debridement Description: Selective/Open Wound Instrument: Curette Bleeding: Minimum Hemostasis Achieved: Pressure End Time: 10:20 Procedural Pain: 4 Post Procedural Pain: 2 Response  to Treatment: Procedure was tolerated well Level of Consciousness (Post- Awake and Alert procedure): Post Debridement Measurements of Total Wound Length: (cm) 4 Width: (cm) 11.8 Depth: (cm) 3 Volume: (cm) 111.212 Character of Wound/Ulcer Post Debridement: Requires Further Debridement Severity of Tissue Post Debridement: Fat layer exposed Post Procedure Diagnosis Same as Pre-procedure Electronic Signature(s) Signed: 02/08/2021 10:45:57 AM By: Kalman Shan DO Signed: 02/08/2021 6:13:38 PM By: Levan Hurst RN, BSN Entered By: Levan Hurst on 02/08/2021 10:23:13 -------------------------------------------------------------------------------- HPI Details Patient Name: Date of Service: [name], [name] 02/08/2021 9:45 A M Medical Record Number: [id number] Patient Account Number: [id number] Date of Birth/Sex: Treating RN: 10/09/1980 (41 y.o. Nancy Fetter Primary Care Provider: Juluis Mire Other Clinician: Referring Provider: Treating Provider/Extender: Edmonia Lynch in Treatment: 2 History of Present Illness HPI Description: Admission 01/22/2021 Ms. Newell Hoffart is a 41 year old female with a past medical history of insulin-dependent uncontrolled type 2 diabetes with last hemoglobin A1c of 13.5, osteomyelitis of the right foot status post transmetatarsal amputation on 12/18/2020 that presents to the clinic for right foot wound. She has had dehiscence of the surgical site. She is currently using wet-to-dry dressings. She has a PICC line and receiving IV ceftriaxone daily for her osteomyelitis. There is an end date of 01/27/2021. She is also taking oral metronidazole. She currently denies systemic signs of infection. 1/19; patient presents for follow-up. She was diagnosed with a DVT to the right lower extremity 2 days ago. She is on Eliquis now. She is scheduled to see her infectious disease doctor tomorrow. She has been using Dakin's wet-to-dry  dressings. She denies systemic signs of infection. 1/26; patient presents for follow-up. She saw infectious disease on 1/21 started on Augmentin. Her PICC line and IV ceftriaxone was discontinued. Patient reports stability to her wound. She has been using Dakin's wet-to-dry dressings. She currently denies systemic signs of infection. Electronic Signature(s) Signed: 02/08/2021 10:45:57 AM By: Kalman Shan DO Entered By: Kalman Shan on 02/08/2021 10:40:44 -------------------------------------------------------------------------------- Physical Exam Details Patient Name:  Date of Service: [name], [name] 02/08/2021 9:45 A M Medical Record Number: [id number] Patient Account Number: [id number] Date of Birth/Sex: Treating RN: 07-14-1980 (41 y.o. Nancy Fetter Primary Care Provider: Juluis Mire Other Clinician: Referring Provider: Treating Provider/Extender: Geryl Councilman Weeks in Treatment: 2 Constitutional respirations regular, non-labored and within target range for patient.Marland Kitchen Psychiatric pleasant and cooperative. Notes Right foot: Transmetatarsal surgical wound site is completely dehisced. There is probing to bone. There is eschar and nonviable tissue throughout the wound bed. Purulent drainage on palpation. Electronic Signature(s) Signed: 02/08/2021 10:45:57 AM By: Kalman Shan DO Entered By: Kalman Shan on 02/08/2021 10:41:36 -------------------------------------------------------------------------------- Physician Orders Details Patient Name: Date of Service: [name], [name] 02/08/2021 9:45 A M Medical Record Number: [id number] Patient Account Number: [id number] Date of Birth/Sex: Treating RN: [birth year] (41 y.o. Nancy Fetter Primary Care Provider: Juluis Mire Other Clinician: Referring Provider: Treating Provider/Extender: Edmonia Lynch in Treatment: 2 Verbal / Phone Orders: No Diagnosis  Coding ICD-10 Coding Code Description L97.514 Non-pressure chronic ulcer of other part of right foot with necrosis of bone E11.621 Type 2 diabetes mellitus with foot ulcer M86.9 Osteomyelitis, unspecified Follow-up Appointments ppointment in 1 week. - with Dr. Heber [place] Return A Bathing/ Shower/ Hygiene Other Bathing/Shower/Hygiene Orders/Instructions: - Clean with saline or Dakins Edema Control - Lymphedema / SCD / Other Elevate legs to the level of the heart or above for 30 minutes daily and/or when sitting, a frequency of: - throughout the day Avoid standing for long periods of time. Moisturize legs daily. Additional Orders / Instructions Follow Nutritious Diet - -Monitor/Control Blood Sugar -High Protein Diet Wound Treatment Wound #1 - Amputation Site - Transmetatarsal Wound Laterality: Right Cleanser: Normal Saline 2 x Per [id number] Days Discharge Instructions: Cleanse the wound with Normal Saline prior to applying a clean dressing using gauze sponges, not tissue or cotton balls. Topical: Dakin's solution 2 x Per Day/15 Days Discharge Instructions: Moisten Gauze with Dakins Secondary Dressing: Woven Gauze Sponge, Non-Sterile 4x4 in 2 x Per Day/15 Days Discharge Instructions: Apply over primary dressing as directed. Secondary Dressing: ABD Pad, 5x9 2 x Per Day/15 Days Discharge Instructions: Apply over primary dressing as directed. Secured With: [employer], 4.5x3.1 (in/yd) 2 x Per Day/15 Days Discharge Instructions: Secure with Kerlix as directed. Secured With: Transpore Surgical Tape, 2x10 (in/yd) 2 x Per Day/15 Days Discharge Instructions: Secure dressing with tape as directed. Electronic Signature(s) Signed: 02/08/2021 10:45:57 AM By: Kalman Shan DO Entered By: Kalman Shan on 02/08/2021 10:41:58 -------------------------------------------------------------------------------- Problem List Details Patient Name: Date of Service: [name], [name] 02/08/2021  9:45 A M Medical Record Number: [id number] Patient Account Number: [id number] Date of Birth/Sex: Treating RN: 12/09/1980 (41 y.o. Nancy Fetter Primary Care Provider: Juluis Mire Other Clinician: Referring Provider: Treating Provider/Extender: Edmonia Lynch in Treatment: 2 Active Problems ICD-10 Encounter Code Description Active Date MDM Diagnosis L97.514 Non-pressure chronic ulcer of other part of right foot with necrosis of bone 01/22/2021 No Yes E11.621 Type 2 diabetes mellitus with foot ulcer 01/22/2021 No Yes M86.9 Osteomyelitis, unspecified 01/22/2021 No Yes Inactive Problems Resolved Problems Electronic Signature(s) Signed: 02/08/2021 10:45:57 AM By: Kalman Shan DO Entered By: Kalman Shan on 02/08/2021 10:37:03 -------------------------------------------------------------------------------- Progress Note Details Patient Name: Date of Service: [name], [name] 02/08/2021 9:45 A M Medical Record Number: [id number] Patient Account Number: [id number] Date of Birth/Sex: Treating RN: 09/04/1980 (41 y.o. Nancy Fetter Primary Care Provider:  Juluis Mire Other Clinician: Referring Provider: Treating Provider/Extender: Edmonia Lynch in Treatment: 2 Subjective Chief Complaint Information obtained from Patient Osteomyelitis of the right foot status post transmetatarsal amputation with surgical site dehiscence History of Present Illness (HPI) Admission 01/22/2021 Ms. Fifi Beasley is a 41 year old female with a past medical history of insulin-dependent uncontrolled type 2 diabetes with last hemoglobin A1c of 13.5, osteomyelitis of the right foot status post transmetatarsal amputation on 12/18/2020 that presents to the clinic for right foot wound. She has had dehiscence of the surgical site. She is currently using wet-to-dry dressings. She has a PICC line and receiving IV ceftriaxone daily for her osteomyelitis.  There is an end date of 01/27/2021. She is also taking oral metronidazole. She currently denies systemic signs of infection. 1/19; patient presents for follow-up. She was diagnosed with a DVT to the right lower extremity 2 days ago. She is on Eliquis now. She is scheduled to see her infectious disease doctor tomorrow. She has been using Dakin's wet-to-dry dressings. She denies systemic signs of infection. 1/26; patient presents for follow-up. She saw infectious disease on 1/21 started on Augmentin. Her PICC line and IV ceftriaxone was discontinued. Patient reports stability to her wound. She has been using Dakin's wet-to-dry dressings. She currently denies systemic signs of infection. Patient History Information obtained from Patient. Family History Cancer - Paternal Grandparents, Diabetes - Mother, Hypertension - Mother, Stroke - Maternal Grandparents, No family history of Heart Disease, Hereditary Spherocytosis, Kidney Disease, Lung Disease, Seizures, Thyroid Problems, Tuberculosis. Social History Never smoker, Marital Status - Single, Alcohol Use - Rarely, Drug Use - Prior History - Marijuana, Caffeine Use - Daily. Medical History Cardiovascular Patient has history of Hypertension Endocrine Patient has history of Type II Diabetes Musculoskeletal Patient has history of Osteomyelitis - Right Transmet 12/18/20 Neurologic Patient has history of Neuropathy Objective Constitutional respirations regular, non-labored and within target range for patient.. Vitals Time Taken: 9:37 AM, Height: 69 in, Temperature: 98.2 F, Pulse: 101 bpm, Respiratory Rate: 18 breaths/min, Blood Pressure: 105/74 mmHg, Capillary Blood Glucose: 238 mg/dl. General Notes: glucose per pt report Psychiatric pleasant and cooperative. General Notes: Right foot: Transmetatarsal surgical wound site is completely dehisced. There is probing to bone. There is eschar and nonviable tissue throughout the wound bed. Purulent  drainage on palpation. Integumentary (Hair, Skin) Wound #1 status is Open. Original cause of wound was Surgical Injury. The date acquired was: 12/15/2020. The wound has been in treatment 2 weeks. The wound is located on the Right Amputation Site - Transmetatarsal. The wound measures 4cm length x 11.8cm width x 3cm depth; 37.071cm^2 area and 111.212cm^3 volume. There is Fat Layer (Subcutaneous Tissue) exposed. There is no undermining noted, however, there is tunneling at 5:00 with a maximum distance of 4.8cm. There is additional tunneling and at :00. There is a medium amount of serosanguineous drainage noted. The wound margin is fibrotic, thickened scar. There is small (1-33%) red, pink granulation within the wound bed. There is a large (67-100%) amount of necrotic tissue within the wound bed including Eschar and Adherent Slough. Assessment Active Problems ICD-10 Non-pressure chronic ulcer of other part of right foot with necrosis of bone Type 2 diabetes mellitus with foot ulcer Osteomyelitis, unspecified Patient's wound is stable. She had a wound culture that showed Prevotella Bivia. This is usually a pelvic floor flora. She was started on Augmentin by infectious disease on 1/20 and this should cover this bacteria. I debrided nonviable tissue. I recommended following up with orthopedic surgery.  I recommended continuing with Dakin's wet-to-dry dressings and aggressive offloading. Procedures Wound #1 Pre-procedure diagnosis of Wound #1 is a Diabetic Wound/Ulcer of the Lower Extremity located on the Right Amputation Site - Transmetatarsal .Severity of Tissue Pre Debridement is: Fat layer exposed. There was a Selective/Open Wound Skin/Epidermis Debridement with a total area of 8 sq cm performed by Geralyn Corwin, DO. With the following instrument(s): Curette to remove Non-Viable tissue/material. Material removed includes Eschar, Callus, and Skin: Epidermis. No specimens were taken. A time out was  conducted at 10:15, prior to the start of the procedure. A Minimum amount of bleeding was controlled with Pressure. The procedure was tolerated well with a pain level of 4 throughout and a pain level of 2 following the procedure. Post Debridement Measurements: 4cm length x 11.8cm width x 3cm depth; 111.212cm^3 volume. Character of Wound/Ulcer Post Debridement requires further debridement. Severity of Tissue Post Debridement is: Fat layer exposed. Post procedure Diagnosis Wound #1: Same as Pre-Procedure Plan Follow-up Appointments: Return Appointment in 1 week. - with Dr. Mikey Bussing Bathing/ Shower/ Hygiene: Other Bathing/Shower/Hygiene Orders/Instructions: - Clean with saline or Dakins Edema Control - Lymphedema / SCD / Other: Elevate legs to the level of the heart or above for 30 minutes daily and/or when sitting, a frequency of: - throughout the day Avoid standing for long periods of time. Moisturize legs daily. Additional Orders / Instructions: Follow Nutritious Diet - -Monitor/Control Blood Sugar -High Protein Diet WOUND #1: - Amputation Site - Transmetatarsal Wound Laterality: Right Cleanser: Normal Saline 2 x Per Day/15 Days Discharge Instructions: Cleanse the wound with Normal Saline prior to applying a clean dressing using gauze sponges, not tissue or cotton balls. Topical: Dakin's solution 2 x Per Day/15 Days Discharge Instructions: Moisten Gauze with Dakins Secondary Dressing: Woven Gauze Sponge, Non-Sterile 4x4 in 2 x Per Day/15 Days Discharge Instructions: Apply over primary dressing as directed. Secondary Dressing: ABD Pad, 5x9 2 x Per Day/15 Days Discharge Instructions: Apply over primary dressing as directed. Secured With: [employer], 4.5x3.1 (in/yd) 2 x Per Day/15 Days Discharge Instructions: Secure with Kerlix as directed. Secured With: Transpore Surgical T ape, 2x10 (in/yd) 2 x Per Day/15 Days Discharge Instructions: Secure dressing with tape as directed. 1. In  office sharp debridement 2. Dakin's wet-to-dry 3. Complete Augmentin 4. Follow-up in 1 week 5. Aggressive offloading Electronic Signature(s) Signed: 02/08/2021 10:45:57 AM By: Geralyn Corwin DO Entered By: Geralyn Corwin on 02/08/2021 10:44:56 -------------------------------------------------------------------------------- HxROS Details Patient Name: Date of Service: [name], [name] 02/08/2021 9:45 A M Medical Record Number: [id number] Patient Account Number: [id number] Date of Birth/Sex: Treating RN: [birth year] (41 y.o. Wynelle Link Primary Care Provider: Gwinda Passe Other Clinician: Referring Provider: Treating Provider/Extender: Grace Isaac in Treatment: 2 Information Obtained From Patient Cardiovascular Medical History: Positive for: Hypertension Endocrine Medical History: Positive for: Type II Diabetes Time with diabetes: Dx 2009 Treated with: Insulin, Oral agents Blood sugar tested every day: Yes Tested : daily Musculoskeletal Medical History: Positive for: Osteomyelitis - Right Transmet 12/18/20 Neurologic Medical History: Positive for: Neuropathy Immunizations Pneumococcal Vaccine: Received Pneumococcal Vaccination: No Implantable Devices Yes Family and Social History Cancer: Yes - Paternal Grandparents; Diabetes: Yes - Mother; Heart Disease: No; Hereditary Spherocytosis: No; Hypertension: Yes - Mother; Kidney Disease: No; Lung Disease: No; Seizures: No; Stroke: Yes - Maternal Grandparents; Thyroid Problems: No; Tuberculosis: No; Never smoker; Marital Status - Single; Alcohol Use: Rarely; Drug Use: Prior History - Marijuana; Caffeine Use: Daily; Financial Concerns: No; Food, Clothing  or Shelter Needs: No; Support System Lacking: No; Transportation Concerns: No Electronic Signature(s) Signed: 02/08/2021 10:45:57 AM By: Kalman Shan DO Signed: 02/08/2021 6:13:38 PM By: Levan Hurst RN, BSN Entered By: Kalman Shan on 02/08/2021 10:40:54 -------------------------------------------------------------------------------- [place] Details Patient Name: Date of Service: [name], [name] 02/08/2021 Medical Record Number: [id number] Patient Account Number: [id number] Date of Birth/Sex: Treating RN: Oct 15, 1980 (41 y.o. Nancy Fetter Primary Care Provider: Juluis Mire Other Clinician: Referring Provider: Treating Provider/Extender: Edmonia Lynch in Treatment: 2 Diagnosis Coding ICD-10 Codes Code Description [phone] Non-pressure chronic ulcer of other part of right foot with necrosis of bone E11.621 Type 2 diabetes mellitus with foot ulcer M86.9 Osteomyelitis, unspecified Facility Procedures CPT4 Code: [id number] Description: [phone] - DEBRIDE WOUND 1ST 20 SQ CM OR < ICD-10 Diagnosis Description L97.514 Non-pressure chronic ulcer of other part of right foot with necrosis of bone Modifier: Quantity: 1 Physician Procedures : CPT4 Code Description Modifier [id number] - WC PHYS DEBR WO ANESTH 20 SQ CM ICD-10 Diagnosis Description L97.514 Non-pressure chronic ulcer of other part of right foot with necrosis of bone Quantity: 1 Electronic Signature(s) Signed: 02/08/2021 10:45:57 AM By: Kalman Shan DO Entered By: Kalman Shan on 02/08/2021 10:45:32

## 2021-02-08 NOTE — Progress Notes (Signed)
[name], [name] ([id number]) Visit Report for 02/08/2021 Arrival Information Details Patient Name: Date of Service: [name] [name], [place] [name] 02/08/2021 9:45 A M Medical Record Number: [id number] Patient Account Number: [id number] Date of Birth/Sex: Treating RN: [birth year] (41 y.o. [name] Primary Care Sloan Takagi: Gwinda Passe Other Clinician: Referring Akbar Sacra: Treating Tin Engram/Extender: Grace Isaac in Treatment: 2 Visit Information History Since Last Visit Added or deleted any medications: No Patient Arrived: Wheel Chair Any new allergies or adverse reactions: No Arrival Time: 09:37 Had a fall or experienced change in No Accompanied By: mother activities of daily living that may affect Transfer Assistance: None risk of falls: Patient Identification Verified: Yes Signs or symptoms of abuse/neglect since last visito No Patient Requires Transmission-Based No Hospitalized since last visit: No Precautions: Implantable device outside of the clinic excluding No Patient Has Alerts: Yes cellular tissue based products placed in the center Patient Alerts: Patient on Blood Thinner since last visit: PICC R Arm Has Dressing in Place as Prescribed: Yes ABI 12/17/20 R=1.08 Pain Present Now: No L=1.13 Electronic Signature(s) Signed: 02/08/2021 6:13:38 PM By: Zandra Abts RN, BSN Entered By: Zandra Abts on 02/08/2021 09:38:15 -------------------------------------------------------------------------------- Encounter Discharge Information Details Patient Name: Date of Service: [name] V IS, [name] [name] 02/08/2021 9:45 A M Medical Record Number: [id number] Patient Account Number: [id number] Date of Birth/Sex: Treating RN: [birth year] (41 y.o. [name] Primary Care Birdell Frasier: Gwinda Passe Other Clinician: Referring Ellyn Rubiano: Treating Breylan Lefevers/Extender: Grace Isaac in Treatment: 2 Encounter Discharge Information  Items Post Procedure Vitals Discharge Condition: Stable Temperature (F): 98.2 Ambulatory Status: Wheelchair Pulse (bpm): 101 Discharge Destination: Home Respiratory Rate (breaths/min): 18 Transportation: Private Auto Blood Pressure (mmHg): 105/74 Accompanied By: mother Schedule Follow-up Appointment: Yes Clinical Summary of Care: Patient Declined Electronic Signature(s) Signed: 02/08/2021 6:13:38 PM By: Zandra Abts RN, BSN Entered By: Zandra Abts on 02/08/2021 13:41:33 -------------------------------------------------------------------------------- Multi Wound Chart Details Patient Name: Date of Service: [name] V IS, [name] [name] 02/08/2021 9:45 A M Medical Record Number: [id number] Patient Account Number: [id number] Date of Birth/Sex: Treating RN: [birth year] (41 y.o. [name] Primary Care Phoenyx Paulsen: Gwinda Passe Other Clinician: Referring Zakiya Sporrer: Treating Kacie Huxtable/Extender: Grace Isaac in Treatment: 2 Vital Signs Height(in): 69 Capillary Blood Glucose(mg/dl): 497 Weight(lbs): Pulse(bpm): 101 Body Mass Index(BMI): Blood Pressure(mmHg): 105/74 Temperature(F): 98.2 Respiratory Rate(breaths/min): 18 Photos: [N/A:N/A] Right Amputation Site - N/A N/A Wound Location: Transmetatarsal Surgical Injury N/A N/A Wounding Event: Diabetic Wound/Ulcer of the Lower N/A N/A Primary Etiology: Extremity Hypertension, Type II Diabetes, N/A N/A Comorbid History: Osteomyelitis, Neuropathy 12/15/2020 N/A N/A Date Acquired: 2 N/A N/A Weeks of Treatment: Open N/A N/A Wound Status: No N/A N/A Wound Recurrence: 4x11.8x3 N/A N/A Measurements L x W x D (cm) 37.071 N/A N/A A (cm) : rea 111.212 N/A N/A Volume (cm) : 12.90% N/A N/A % Reduction in A rea: -63.30% N/A N/A % Reduction in Volume: 5 Position 1 (o'clock): 4.8 Maximum Distance 1 (cm): Position 2 (o'clock): Yes N/A N/A Tunneling: Grade 2 N/A N/A Classification: Medium  N/A N/A Exudate A mount: Serosanguineous N/A N/A Exudate Type: red, brown N/A N/A Exudate Color: Fibrotic scar, thickened scar N/A N/A Wound Margin: Small (1-33%) N/A N/A Granulation A mount: Red, Pink N/A N/A Granulation Quality: Large (67-100%) N/A N/A Necrotic A mount: Eschar, Adherent Slough N/A N/A Necrotic Tissue: Fat Layer (Subcutaneous Tissue): Yes N/A N/A Exposed Structures: Fascia: No Tendon: No Muscle: No Joint: No Bone: No None N/A N/A Epithelialization: Debridement -  Selective/Open Wound N/A N/A Debridement: Pre-procedure Verification/Time Out 10:15 N/A N/A Taken: Necrotic/Eschar, Callus N/A N/A Tissue Debrided: Skin/Epidermis N/A N/A Level: 8 N/A N/A Debridement A (sq cm): rea Curette N/A N/A Instrument: Minimum N/A N/A Bleeding: Pressure N/A N/A Hemostasis A chieved: 4 N/A N/A Procedural Pain: 2 N/A N/A Post Procedural Pain: Procedure was tolerated well N/A N/A Debridement Treatment Response: 4x11.8x3 N/A N/A Post Debridement Measurements L x W x D (cm) 111.212 N/A N/A Post Debridement Volume: (cm) Debridement N/A N/A Procedures Performed: Treatment Notes Electronic Signature(s) Signed: 02/08/2021 10:45:57 AM By: Geralyn Corwin DO Signed: 02/08/2021 6:13:38 PM By: Zandra Abts RN, BSN Entered By: Geralyn Corwin on 02/08/2021 10:37:12 -------------------------------------------------------------------------------- Multi-Disciplinary Care Plan Details Patient Name: Date of Service: [name] V IS, [name] [name] 02/08/2021 9:45 A M Medical Record Number: [id number] Patient Account Number: [id number] Date of Birth/Sex: Treating RN: 05/17/1980 (41 y.o. [name] Primary Care Reshad Saab: Gwinda Passe Other Clinician: Referring Nathifa Ritthaler: Treating Courage Biglow/Extender: Grace Isaac in Treatment: 2 Active Inactive Nutrition Nursing Diagnoses: Impaired glucose control: actual or  potential Goals: Patient/caregiver verbalizes understanding of need to maintain therapeutic glucose control per primary care physician Date Initiated: 01/22/2021 Target Resolution Date: 02/19/2021 Goal Status: Active Interventions: Assess HgA1c results as ordered upon admission and as needed Provide education on elevated blood sugars and impact on wound healing Treatment Activities: Obtain HgA1c : 01/22/2021 Notes: Wound/Skin Impairment Nursing Diagnoses: Impaired tissue integrity Goals: Patient/caregiver will verbalize understanding of skin care regimen Date Initiated: 01/22/2021 Target Resolution Date: 02/19/2021 Goal Status: Active Ulcer/skin breakdown will have a volume reduction of 30% by week 4 Date Initiated: 01/22/2021 Target Resolution Date: 02/19/2021 Goal Status: Active Interventions: Assess patient/caregiver ability to obtain necessary supplies Assess patient/caregiver ability to perform ulcer/skin care regimen upon admission and as needed Assess ulceration(s) every visit Provide education on ulcer and skin care Treatment Activities: Topical wound management initiated : 01/22/2021 Notes: Electronic Signature(s) Signed: 02/08/2021 6:13:38 PM By: Zandra Abts RN, BSN Entered By: Zandra Abts on 02/08/2021 10:07:39 -------------------------------------------------------------------------------- Pain Assessment Details Patient Name: Date of Service: [name] [name], [name] [name] 02/08/2021 9:45 A M Medical Record Number: [id number] Patient Account Number: [id number] Date of Birth/Sex: Treating RN: Jan 02, 1981 (41 y.o. [name] Primary Care Zohal Reny: Gwinda Passe Other Clinician: Referring Farrah Skoda: Treating Shyanne Mcclary/Extender: Grace Isaac in Treatment: 2 Active Problems Location of Pain Severity and Description of Pain Patient Has Paino Yes Site Locations With Dressing Change: Yes Rate the pain. Current Pain Level: 3 Character of  Pain Describe the Pain: Dull Pain Management and Medication Current Pain Management: Medication: Yes Cold Application: No Rest: No Massage: No Activity: No T.E.N.S.: No Heat Application: No Leg drop or elevation: No [name] the Current Pain Management Adequate: Adequate How does your wound impact your activities of daily livingo Sleep: No Bathing: No Appetite: No Relationship With Others: No Bladder Continence: No Emotions: No Bowel Continence: No Work: No Toileting: No Drive: No Dressing: No Hobbies: No Psychologist, prison and probation services) Signed: 02/08/2021 6:13:38 PM By: Zandra Abts RN, BSN Entered By: Zandra Abts on 02/08/2021 09:43:44 -------------------------------------------------------------------------------- Patient/Caregiver Education Details Patient Name: Date of Service: [name] [name], [name] [name] 1/26/2023andnbsp9:45 A M Medical Record Number: [id number] Patient Account Number: [id number] Date of Birth/Gender: Treating RN: 1980-01-20 (41 y.o. [name] Primary Care Physician: Gwinda Passe Other Clinician: Referring Physician: Treating Physician/Extender: Grace Isaac in Treatment: 2 Education Assessment Education Provided To: Patient Education Topics Provided Wound/Skin Impairment: Methods: Explain/Verbal Responses:  State content correctly Electronic Signature(s) Signed: 02/08/2021 6:13:38 PM By: Zandra AbtsLynch, Shatara RN, BSN Entered By: Zandra AbtsLynch, Shatara on 02/08/2021 10:07:56 -------------------------------------------------------------------------------- Wound Assessment Details Patient Name: Date of Service: [name] V IS, [name] [name] 02/08/2021 9:45 A M Medical Record Number: [id number] Patient Account Number: [id number] Date of Birth/Sex: Treating RN: [birth year] (41 y.o. 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 Wound Status: Open Wounding Event: Surgical Injury Comorbid Hypertension, Type II Diabetes, Osteomyelitis, History: Neuropathy Date Acquired: 12/15/2020 Weeks Of Treatment: 2 Clustered Wound: No Photos Wound Measurements Length: (cm) 4 Width: (cm) 11.8 Depth: (cm) 3 Area: (cm) 37.071 Volume: (cm) 111.212 % Reduction in Area: 12.9% % Reduction in Volume: -63.3% Epithelialization: None Tunneling: Yes Location 1 Position (o'clock): 5 Maximum Distance: (cm) 4.8 Location 2 Undermining: No Wound Description Classification: Grade 2 Wound Margin: Fibrotic scar, thickened scar Exudate Amount: Medium Exudate Type: Serosanguineous Exudate Color: red, brown Foul Odor After Cleansing: No Slough/Fibrino Yes Wound Bed Granulation Amount: Small (1-33%) Exposed Structure Granulation Quality: Red, Pink Fascia Exposed: No Necrotic Amount: Large (67-100%) Fat Layer (Subcutaneous Tissue) Exposed: Yes Necrotic Quality: Eschar, Adherent Slough Tendon Exposed: No Muscle Exposed: No Joint Exposed: No Bone Exposed: No Treatment Notes Wound #1 (Amputation Site - Transmetatarsal) Wound Laterality: Right Cleanser Normal Saline Discharge Instruction: Cleanse the wound with Normal Saline prior to applying a clean dressing using gauze sponges, not tissue or cotton balls. Peri-Wound Care Topical Dakin's solution Discharge Instruction: Moisten Gauze with Dakins Primary Dressing Secondary Dressing Woven Gauze Sponge, Non-Sterile 4x4 in Discharge Instruction: Apply over primary dressing as directed. ABD Pad, 5x9 Discharge Instruction: Apply over primary dressing as directed. Secured With [employer] GroupKerlix Roll Sterile, 4.5x3.1 (in/yd) Discharge Instruction: Secure with Kerlix as directed. Transpore Surgical Tape, 2x10  (in/yd) Discharge Instruction: Secure dressing with tape as directed. Compression Wrap Compression Stockings Add-Ons Electronic Signature(s) Signed: 02/08/2021 6:13:38 PM By: Zandra AbtsLynch, Shatara RN, BSN Entered By: Zandra AbtsLynch, Shatara on 02/08/2021 09:54:25 -------------------------------------------------------------------------------- Vitals Details Patient Name: Date of Service: [name] V IS, [name] [name] 02/08/2021 9:45 A M Medical Record Number: [id number] Patient Account Number: [id number] Date of Birth/Sex: Treating RN: [birth year] (41 y.o. [name] LinkF) Lynch, Shatara Primary Care Daison Braxton: Other Clinician: Gwinda PasseEdwards, Michelle Referring Emersynn Deatley: Treating Khaya Theissen/Extender: Grace IsaacHoffman, Jessica Edwards, Michelle Weeks in Treatment: 2 Vital Signs Time Taken: 09:37 Temperature (F): 98.2 Height (in): 69 Pulse (bpm): 101 Respiratory Rate (breaths/min): 18 Blood Pressure (mmHg): 105/74 Capillary Blood Glucose (mg/dl): 962238 Reference Range: 80 - 120 mg / dl Notes glucose per pt report Electronic Signature(s) Signed: 02/08/2021 6:13:38 PM By: Zandra AbtsLynch, Shatara RN, BSN Entered By: Zandra AbtsLynch, Shatara on 02/08/2021 09:43:19

## 2021-02-13 ENCOUNTER — Other Ambulatory Visit: Payer: Self-pay

## 2021-02-13 ENCOUNTER — Encounter (INDEPENDENT_AMBULATORY_CARE_PROVIDER_SITE_OTHER): Payer: Self-pay | Admitting: Primary Care

## 2021-02-13 ENCOUNTER — Ambulatory Visit (INDEPENDENT_AMBULATORY_CARE_PROVIDER_SITE_OTHER): Payer: Medicaid Other | Admitting: Primary Care

## 2021-02-13 VITALS — BP 97/66 | HR 98 | Temp 98.3°F | Ht 69.0 in

## 2021-02-13 DIAGNOSIS — I82401 Acute embolism and thrombosis of unspecified deep veins of right lower extremity: Secondary | ICD-10-CM | POA: Diagnosis not present

## 2021-02-13 DIAGNOSIS — Z09 Encounter for follow-up examination after completed treatment for conditions other than malignant neoplasm: Secondary | ICD-10-CM

## 2021-02-13 NOTE — Progress Notes (Signed)
[facility]  [name], is a 41 y.o. female  MPN:[id number]  MGQ:[id number]  DOB - 11-Jul-1980  Chief Complaint  Patient presents with   Hospitalization Follow-up    DVT       Subjective:   [name] is a 41 y.o. female here today for a follow up visit.Patient was hospitalized from 12/3 to 12/23/2020 with diabetic food ulcer and MRI evidence of osteomyelitis. She underwent right foot transmetatarsal amputation and I&D of deep abscess.  Patient has No headache, No chest pain, No abdominal pain - No Nausea, No new weakness tingling or numbness, No Cough - SOB.  No problems updated.  ALLERGIES: Allergies  Allergen Reactions   Mangifera Indica Rash    FRESH MANGO  Pt states she is not allergic to mango    PAST MEDICAL HISTORY: Past Medical History:  Diagnosis Date   Class 3 obesity (HCC) 12/16/2020   Diabetes mellitus     MEDICATIONS AT HOME: Prior to Admission medications   Medication Sig Start Date End Date Taking? Authorizing Provider  acetaminophen (TYLENOL) 325 MG tablet Take 2 tablets (650 mg total) by mouth every 6 (six) hours as needed for mild pain (or Fever >/= 101). 12/23/20  Yes Rodolph Bong, MD  amoxicillin-clavulanate (AUGMENTIN) 875-125 MG tablet Take 1 tablet by mouth 2 (two) times daily. 02/02/21  Yes Odette Fraction, MD  apixaban (ELIQUIS) 5 MG TABS tablet Start with two-5mg  tablets twice daily for 7 days. On day 8, switch to one-5mg  tablet twice daily. 01/30/21  Yes Pollyann Savoy, MD  gabapentin (NEURONTIN) 300 MG capsule Take 2 capsules by mouth 3 times daily. 12/23/20  Yes Rodolph Bong, MD  Insulin Glargine [facility]) 100 UNIT/ML Inject 56 units into the skin daily. 01/17/21  Yes Grayce Sessions, NP  Insulin Pen Needle (PEN NEEDLES 3/16") 31G X 5 MM MISC 40 Units by Does not apply route 2 (two) times daily before a meal. 12/23/20  Yes Rodolph Bong, MD  lisinopril (ZESTRIL) 2.5 MG tablet Take 1 tablet  by mouth daily 01/17/21  Yes Grayce Sessions, NP  metFORMIN (GLUCOPHAGE-XR) 500 MG 24 hr tablet Take 1 tablet by mouth twice daily. 01/17/21  Yes Grayce Sessions, NP  oxyCODONE (OXY IR/ROXICODONE) 5 MG immediate release tablet Take 1 tablet (5 mg total) by mouth every 3 (three) hours as needed for moderate pain. 12/23/20  Yes Rodolph Bong, MD  ondansetron (ZOFRAN) 4 MG tablet Take 1 tablet (4 mg total) by mouth every 6 (six) hours as needed for nausea. Patient not taking: Reported on 02/13/2021 12/23/20   Rodolph Bong, MD    Objective:   Vitals:   02/13/21 1547  BP: 97/66  Pulse: 98  Temp: 98.3 F (36.8 C)  TempSrc: Oral  SpO2: 97%  Height: 5\' 9"  (1.753 m)   Exam General appearance : Awake, alert, not in any distress. Speech Clear. Not toxic looking HEENT: Atraumatic and Normocephalic, pupils equally reactive to light and accomodation Neck: Supple, no JVD. No cervical lymphadenopathy.  Chest: Good air entry bilaterally, no added sounds  CVS: S1 S2 regular, no murmurs.  Abdomen: Bowel sounds present, Non tender and not distended with no gaurding, rigidity or rebound. Extremities: B/L Lower Ext shows no edema, both legs are warm to touch Neurology: Awake alert, and oriented X 3, CN II-XII intact, Non focal Skin: No Rash  Data Review Lab Results  Component Value Date   HGBA1C 13.5 (H) 12/16/2020  Assessment & Plan  [name] was seen today for hospitalization follow-up.  Diagnoses and all orders for this visit:  Hospital discharge follow-up Retrieve from hosp. D/c  Schedule an appointment with Grayce Sessions, NP (Internal Medicine) Follow up with [facility] (Emergency Medicine); If symptoms worsen  Deep vein thrombosis (DVT) of right lower extremity, unspecified chronicity, unspecified vein (HCC) apixaban (ELIQUIS) 5 MG TABS tablet Start with two-5mg  tablets twice daily for 7 days. On day 8, switch to one-5mg  tablet  twice daily.     There are no diagnoses linked to this encounter.   Patient have been counseled extensively about nutrition and exercise. Other issues discussed during this visit include: low cholesterol diet, weight control and daily exercise, foot care, annual eye examinations at Ophthalmology, importance of adherence with medications and regular follow-up. We also discussed long term complications of uncontrolled diabetes and hypertension.   Keep schedule appt for DM  The patient was given clear instructions to go to ER or return to medical center if symptoms don't improve, worsen or new problems develop. The patient verbalized understanding. The patient was told to call to get lab results if they haven't heard anything in the next week.   This note has been created with Education officer, environmental. Any transcriptional errors are unintentional.   Grayce Sessions, NP 02/18/2021, 10:18 PM

## 2021-02-14 ENCOUNTER — Other Ambulatory Visit: Payer: Self-pay

## 2021-02-14 ENCOUNTER — Ambulatory Visit: Payer: Medicaid Other | Attending: Primary Care | Admitting: Pharmacist

## 2021-02-14 DIAGNOSIS — E1165 Type 2 diabetes mellitus with hyperglycemia: Secondary | ICD-10-CM

## 2021-02-14 DIAGNOSIS — Z794 Long term (current) use of insulin: Secondary | ICD-10-CM

## 2021-02-14 MED ORDER — BASAGLAR KWIKPEN 100 UNIT/ML ~~LOC~~ SOPN
60.0000 [IU] | PEN_INJECTOR | Freq: Every day | SUBCUTANEOUS | 2 refills | Status: DC
  Filled 2021-02-14: qty 15, 25d supply, fill #0
  Filled 2021-03-18: qty 15, 25d supply, fill #1
  Filled 2021-04-29: qty 15, 25d supply, fill #2

## 2021-02-14 MED ORDER — METFORMIN HCL ER 500 MG PO TB24
1000.0000 mg | ORAL_TABLET | Freq: Two times a day (BID) | ORAL | 2 refills | Status: DC
  Filled 2021-02-14: qty 120, 30d supply, fill #0
  Filled 2021-04-03: qty 120, 30d supply, fill #1
  Filled 2021-05-31: qty 120, 30d supply, fill #2

## 2021-02-14 MED ORDER — TRUEPLUS LANCETS 28G MISC
2 refills | Status: AC
  Filled 2021-02-14: qty 100, 50d supply, fill #0

## 2021-02-14 MED ORDER — TRUE METRIX BLOOD GLUCOSE TEST VI STRP
ORAL_STRIP | 2 refills | Status: AC
  Filled 2021-02-14: qty 100, 50d supply, fill #0

## 2021-02-14 MED ORDER — INSULIN PEN NEEDLE 32G X 4 MM MISC
2 refills | Status: DC
  Filled 2021-02-14: qty 100, 33d supply, fill #0
  Filled 2021-07-30: qty 100, 33d supply, fill #1

## 2021-02-14 MED ORDER — TRUE METRIX METER W/DEVICE KIT
PACK | 0 refills | Status: AC
  Filled 2021-02-14: qty 1, 30d supply, fill #0

## 2021-02-14 NOTE — Progress Notes (Signed)
° ° °  S:    PCP: Sharyn Lull   No chief complaint on file.  Patient arrives in good spirits.  Presents for diabetes evaluation, education, and management. Patient was referred and last seen by Primary Care Provider on 01/17/2021.    Patient was hospitalized from 12/3 to 12/23/2020 with diabetic food ulcer and MRI evidence of osteomyelitis. She underwent right foot transmetatarsal amputation and I&D of deep abscess. She completed IV antibiotics in the outpatient setting and is now being managed by ID and Ortho. Of note, her A1c was found to be 13.5%.   Since then, she was seen by Sharyn Lull and we changed her 70/30 insulin to Basaglar once daily. We also started metformin in her. Pt with no history of pancreatitis or thyroid cancer. No history of CKD, CHF, ACS/CAD, or stroke.   Family/Social History:  Fhx: HTN, DM Tobacco: never smoker   Alcohol: none reported  Insurance coverage/medication affordability: self pay  Medication adherence reported.   Current diabetes medications include: Basaglar 56 units daily, metformin 500 mg XR BID Current hypertension medications include: lisinopril 2.5 mg daily  Current hyperlipidemia medications include: none  Patient denies hypoglycemic events.  Patient reported dietary habits:  - Patient has been using Splenda and honey to sweeten beverages  - Reports that she is trying to eat the right things but would like to see a Nutritionist   Patient-reported exercise habits:  - None current as she is mostly wheelchair bound   Patient reports improvement in polyuria, polydipsia. Patient denies neuropathy (nerve pain). Patient denies visual changes. Patient reports self foot exams.     O:  Physical Exam  ROS  Lab Results  Component Value Date   HGBA1C 13.5 (H) 12/16/2020   There were no vitals filed for this visit.  Lipid Panel     Component Value Date/Time   CHOL 92 12/20/2020 0434   TRIG 103 12/20/2020 0434   HDL 13 (L) 12/20/2020 0434    CHOLHDL 7.1 12/20/2020 0434   VLDL 21 12/20/2020 0434   LDLCALC 58 12/20/2020 0434   Home fasting blood sugars: reports mostly 170s - low to mid 200s. Admits that she does not check as often as she should.   Clinical Atherosclerotic Cardiovascular Disease (ASCVD): No  The ASCVD Risk score (Arnett DK, et al., 2019) failed to calculate for the following reasons:   The 2019 ASCVD risk score is only valid for ages 63 to 45   A/P: Diabetes longstanding currently uncontrolled. Patient is able to verbalize appropriate hypoglycemia management plan. Medication adherence appears appropriate. -Increased dose of Basaglar to 60 units daily.  -Increased dose of metformin to 1000 mg XR BID (pt instructed to take two 500 mg XR tablets BID) -Extensively discussed pathophysiology of diabetes, recommended lifestyle interventions, dietary effects on blood sugar control -Counseled on s/sx of and management of hypoglycemia -Next A1C anticipated 03/2021.   Written patient instructions provided.  Total time in face to face counseling 30 minutes.   Follow up Pharmacist Clinic Visit in 1 month.   Benard Halsted, PharmD, Para [place], [place] [phone]

## 2021-02-15 ENCOUNTER — Ambulatory Visit (INDEPENDENT_AMBULATORY_CARE_PROVIDER_SITE_OTHER): Payer: Medicaid Other | Admitting: Infectious Diseases

## 2021-02-15 ENCOUNTER — Encounter: Payer: Self-pay | Admitting: Infectious Diseases

## 2021-02-15 ENCOUNTER — Other Ambulatory Visit: Payer: Self-pay

## 2021-02-15 VITALS — BP 112/70 | HR 81 | Temp 98.2°F | Wt 300.2 lb

## 2021-02-15 DIAGNOSIS — E1165 Type 2 diabetes mellitus with hyperglycemia: Secondary | ICD-10-CM | POA: Diagnosis not present

## 2021-02-15 DIAGNOSIS — T8131XD Disruption of external operation (surgical) wound, not elsewhere classified, subsequent encounter: Secondary | ICD-10-CM

## 2021-02-15 DIAGNOSIS — E669 Obesity, unspecified: Secondary | ICD-10-CM

## 2021-02-15 DIAGNOSIS — Z5181 Encounter for therapeutic drug level monitoring: Secondary | ICD-10-CM

## 2021-02-15 MED ORDER — AMOXICILLIN-POT CLAVULANATE 875-125 MG PO TABS
1.0000 | ORAL_TABLET | Freq: Two times a day (BID) | ORAL | 0 refills | Status: DC
  Filled 2021-02-15: qty 30, 15d supply, fill #0

## 2021-02-15 NOTE — Progress Notes (Addendum)
Patient Active Problem List   Diagnosis Date Noted   Wound dehiscence, surgical 02/02/2021   Obesity 02/02/2021   Wound dehiscence 01/24/2021   Medication monitoring encounter 01/02/2021   Diabetic ulcer of right foot associated with diabetes mellitus due to underlying condition ([place]) 01/02/2021   Osteomyelitis of right foot ([place])    Osteomyelitis of second toe of right foot ([place]) 12/16/2020   Type 2 diabetes mellitus with hyperglycemia ([place]) 12/16/2020   Class 3 obesity ([place]) 12/16/2020   Hyperlipidemia 12/16/2020   Soft tissue mass 09/15/2015    Patient's Medications  New Prescriptions   No medications on file  Previous Medications   ACETAMINOPHEN (TYLENOL) 325 MG TABLET    Take 2 tablets (650 mg total) by mouth every 6 (six) hours as needed for mild pain (or Fever >/= 101).   AMOXICILLIN-CLAVULANATE (AUGMENTIN) 875-125 MG TABLET    Take 1 tablet by mouth 2 (two) times daily.   APIXABAN (ELIQUIS) 5 MG TABS TABLET    Start with two-66m tablets twice daily for 7 days. On day 8, switch to one-580mtablet twice daily.   BLOOD GLUCOSE MONITORING SUPPL (TRUE METRIX METER) W/DEVICE KIT    Use to check blood sugar twice a day.   GABAPENTIN (NEURONTIN) 300 MG CAPSULE    Take 2 capsules by mouth 3 times daily.   GLUCOSE BLOOD (TRUE METRIX BLOOD GLUCOSE TEST) TEST STRIP    Use to check blood sugar twice a day.   INSULIN GLARGINE (BASAGLAR KWIKPEN) 100 UNIT/ML    Inject 60 Units into the skin daily.   INSULIN PEN NEEDLE 32G X 4 MM MISC    use as directed   LISINOPRIL (ZESTRIL) 2.5 MG TABLET    Take 1 tablet by mouth daily   METFORMIN (GLUCOPHAGE-XR) 500 MG 24 HR TABLET    Take 2 tablets (1,000 mg total) by mouth 2 (two) times daily.   ONDANSETRON (ZOFRAN) 4 MG TABLET    Take 1 tablet (4 mg total) by mouth every 6 (six) hours as needed for nausea.   OXYCODONE (OXY IR/ROXICODONE) 5 MG IMMEDIATE RELEASE TABLET    Take 1 tablet (5 mg total) by mouth every 3 (three) hours as needed for  moderate pain.   TRUEPLUS LANCETS 28G MISC    Use to check blood sugar twice a day.  Modified Medications   No medications on file  Discontinued Medications   No medications on file    Subjective: Here for follow up for wound dehiscence at the RT TMA site. She is taking augmentin twice a day without any issues. Denies fevers, chills. Denies nausea, vomiting and diarrhea. Saw dr raKathaleen Buryhis Tuesday, was told wound looks good. There is a plan for skin graft in the future. Closely following with wound care. Denies any pain/tenderness and swelling at the TMA site except some on and off clear bloody drainage. Discussed about BG control as well close follow up with wound care and Ortho.   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   Procedure: TRANSMETATARSAL AMPUTATION;  Surgeon: RaArmond HangMD;  Location: [facility];  Service: Orthopedics;  Laterality: Right;    Social History   Tobacco Use  Smoking status: Never  Vaping Use   Vaping Use: Never used  Substance Use Topics   Alcohol use: Yes    Comment: occ   Drug use: Yes    Types: Marijuana    Family History  Problem Relation Age of Onset   Hypertension Mother    Diabetes Mother    Diabetes Other     Allergies  Allergen Reactions   Mangifera Indica Rash    FRESH MANGO  Pt states she is not allergic to mango    Health Maintenance  Topic Date Due   COVID-19 Vaccine (1) Never done   FOOT EXAM  Never done   OPHTHALMOLOGY EXAM  Never done   Hepatitis C Screening  Never done   PAP SMEAR-Modifier  Never done   INFLUENZA VACCINE  04/13/2021 (Originally 08/14/2020)   TETANUS/TDAP  01/17/2022 (Originally 06/06/2000)   HEMOGLOBIN A1C  06/16/2021   HIV Screening   Completed   HPV VACCINES  Aged Out    Objective:  Vitals:   02/15/21 1056  BP: 112/70  Pulse: 81  Temp: 98.2 F (36.8 C)  TempSrc: Oral  SpO2: 100%   There is no height or weight on file to calculate BMI.  Physical Exam Constitutional:      Appearance: Normal appearance. Obese  HENT:     Head: Normocephalic and atraumatic.      Mouth: Mucous membranes are moist.  Eyes:    Conjunctiva/sclera: Conjunctivae normal.     Pupils: Pupils are equal, round  Cardiovascular:     Rate and Rhythm: Normal rate and regular rhythm.     Heart sounds:   Pulmonary:     Effort: Pulmonary effort is normal.     Breath sounds:   Abdominal:     General: Non distended     Palpations: soft.   Musculoskeletal:        General: Normal range of motion.   Rt TMA Site:    Skin:    General: Skin is warm and dry.     Comments:  Neurological:     General: grossly non focal     Mental Status: awake, alert and oriented to person, place, and time.   Psychiatric:        Mood and Affect: Mood normal.   Lab Results Lab Results  Component Value Date   WBC 9.1 01/30/2021   HGB 11.7 (L) 01/30/2021   HCT 37.6 01/30/2021   MCV 85.3 01/30/2021   PLT 295 01/30/2021    Lab Results  Component Value Date   CREATININE 0.64 01/30/2021   BUN 18 01/30/2021   NA 133 (L) 01/30/2021   K 4.0 01/30/2021   CL 100 01/30/2021   CO2 25 01/30/2021    Lab Results  Component Value Date   ALT 8 01/17/2021   AST 15 01/17/2021   ALKPHOS 115 01/17/2021   BILITOT 0.3 01/17/2021    Lab Results  Component Value Date   CHOL 92 12/20/2020   HDL 13 (L) 12/20/2020   LDLCALC 58 12/20/2020   TRIG 103 12/20/2020   CHOLHDL 7.1 12/20/2020   No results found for: LABRPR, RPRTITER No results found for: HIV1RNAQUANT, HIV1RNAVL, CD4TABS  Problem List Items Addressed This Visit       Endocrine   Type 2 diabetes mellitus with hyperglycemia ([place])     Other   Medication monitoring encounter   Wound  dehiscence, surgical - Primary   Relevant Orders   CBC   Comprehensive metabolic panel  C-reactive protein   Sedimentation rate   Assessment/Plan Wound dehiscence at the RT TMA site ( 1/19 cx Prevotella bivia) Medication Monitoring Uncontrolled DM-A1c 13.5, follows with PCP Obesity   Continue Augmentin, meds refilled.  Plan for 4 weeks from 1/19. Tentative end date 2/16 Labs today  Fu with wound care and Orthopedics Fu in 2 weeks   I have personally spent 40  minutes involved in face-to-face and non-face-to-face activities for this patient on the day of the visit including staff time, discussion of plan and counseling of the patient.   Wilber Oliphant, [facility] 02/15/2021, 11:14 AM

## 2021-02-16 ENCOUNTER — Other Ambulatory Visit (HOSPITAL_COMMUNITY): Payer: Self-pay | Admitting: Internal Medicine

## 2021-02-16 ENCOUNTER — Telehealth: Payer: Self-pay | Admitting: Clinical

## 2021-02-16 ENCOUNTER — Encounter (HOSPITAL_BASED_OUTPATIENT_CLINIC_OR_DEPARTMENT_OTHER): Payer: Medicaid Other | Attending: Internal Medicine | Admitting: Internal Medicine

## 2021-02-16 ENCOUNTER — Other Ambulatory Visit (HOSPITAL_BASED_OUTPATIENT_CLINIC_OR_DEPARTMENT_OTHER): Payer: Self-pay | Admitting: Internal Medicine

## 2021-02-16 DIAGNOSIS — M869 Osteomyelitis, unspecified: Secondary | ICD-10-CM | POA: Insufficient documentation

## 2021-02-16 DIAGNOSIS — Z89431 Acquired absence of right foot: Secondary | ICD-10-CM | POA: Insufficient documentation

## 2021-02-16 DIAGNOSIS — Z7901 Long term (current) use of anticoagulants: Secondary | ICD-10-CM | POA: Insufficient documentation

## 2021-02-16 DIAGNOSIS — L97514 Non-pressure chronic ulcer of other part of right foot with necrosis of bone: Secondary | ICD-10-CM | POA: Insufficient documentation

## 2021-02-16 DIAGNOSIS — Z86718 Personal history of other venous thrombosis and embolism: Secondary | ICD-10-CM | POA: Insufficient documentation

## 2021-02-16 DIAGNOSIS — E11621 Type 2 diabetes mellitus with foot ulcer: Secondary | ICD-10-CM | POA: Insufficient documentation

## 2021-02-16 DIAGNOSIS — Y835 Amputation of limb(s) as the cause of abnormal reaction of the patient, or of later complication, without mention of misadventure at the time of the procedure: Secondary | ICD-10-CM | POA: Insufficient documentation

## 2021-02-16 DIAGNOSIS — E1169 Type 2 diabetes mellitus with other specified complication: Secondary | ICD-10-CM | POA: Insufficient documentation

## 2021-02-16 DIAGNOSIS — T8781 Dehiscence of amputation stump: Secondary | ICD-10-CM | POA: Insufficient documentation

## 2021-02-16 LAB — COMPREHENSIVE METABOLIC PANEL
AG Ratio: 1.1 (calc) (ref 1.0–2.5)
ALT: 5 U/L — ABNORMAL LOW (ref 6–29)
AST: 10 U/L (ref 10–30)
Albumin: 3.8 g/dL (ref 3.6–5.1)
Alkaline phosphatase (APISO): 77 U/L (ref 31–125)
BUN: 11 mg/dL (ref 7–25)
CO2: 28 mmol/L (ref 20–32)
Calcium: 9.9 mg/dL (ref 8.6–10.2)
Chloride: 99 mmol/L (ref 98–110)
Creat: 0.71 mg/dL (ref 0.50–0.97)
Globulin: 3.6 g/dL (calc) (ref 1.9–3.7)
Glucose, Bld: 168 mg/dL — ABNORMAL HIGH (ref 65–99)
Potassium: 4.5 mmol/L (ref 3.5–5.3)
Sodium: 137 mmol/L (ref 135–146)
Total Bilirubin: 0.6 mg/dL (ref 0.2–1.2)
Total Protein: 7.4 g/dL (ref 6.1–8.1)

## 2021-02-16 LAB — CBC
HCT: 37.8 % (ref 35.0–45.0)
Hemoglobin: 12.2 g/dL (ref 11.7–15.5)
MCH: 26.1 pg — ABNORMAL LOW (ref 27.0–33.0)
MCHC: 32.3 g/dL (ref 32.0–36.0)
MCV: 80.8 fL (ref 80.0–100.0)
MPV: 11.9 fL (ref 7.5–12.5)
Platelets: 313 10*3/uL (ref 140–400)
RBC: 4.68 10*6/uL (ref 3.80–5.10)
RDW: 13.7 % (ref 11.0–15.0)
WBC: 8.7 10*3/uL (ref 3.8–10.8)

## 2021-02-16 LAB — SEDIMENTATION RATE: Sed Rate: 97 mm/h — ABNORMAL HIGH (ref 0–20)

## 2021-02-16 LAB — C-REACTIVE PROTEIN: CRP: 53 mg/L — ABNORMAL HIGH (ref <8.0)

## 2021-02-16 NOTE — Telephone Encounter (Signed)
Spoke with pt who mentioned that she has applied for the orange card and is waiting to receive additional documents so that she can complete application. She also mentioned that she has applied for medicaid. She mentioned that she uses [place] transportation to get to medical appointments but could use additional transportation assistance. I will complete the [place] [employer] transit application and pt mentioned that she has to pick up medication next week and she will come in to complete her portion and sign. She also mentioned that she currently has a wheelchair that was donated to her but could use another due to it having problems.

## 2021-02-19 ENCOUNTER — Other Ambulatory Visit: Payer: Self-pay

## 2021-02-19 NOTE — Progress Notes (Signed)
Haye, Crucita L. ([id number]) Visit Report for 02/16/2021 Arrival Information Details Patient Name: Date of Service: [name], [name] 02/16/2021 10:15 A M Medical Record Number: [id number] Patient Account Number: [id number] Date of Birth/Sex: Treating RN: Sep 11, 1980 (41 y.o. Billy [place], Linda Primary Care Peighton Mehra: Gwinda Passe Other Clinician: Referring Kamrin Sibley: Treating Yen Wandell/Extender: Grace Isaac in Treatment: 3 Visit Information History Since Last Visit Added or deleted any medications: No Patient Arrived: Wheel Chair Any new allergies or adverse reactions: No Arrival Time: 10:24 Had a fall or experienced change in No Accompanied By: mother activities of daily living that may affect Transfer Assistance: Manual risk of falls: Patient Identification Verified: Yes Signs or symptoms of abuse/neglect since last visito No Secondary Verification Process Completed: Yes Hospitalized since last visit: No Patient Requires Transmission-Based No Implantable device outside of the clinic excluding No Precautions: cellular tissue based products placed in the center Patient Has Alerts: Yes since last visit: Patient Alerts: Patient on Blood Thinner Has Dressing in Place as Prescribed: Yes PICC R Arm Pain Present Now: No ABI 12/17/20 R=1.08 L=1.13 Electronic Signature(s) Signed: 02/19/2021 8:03:42 AM By: Karl Ito Entered By: Karl Ito on 02/16/2021 10:24:56 -------------------------------------------------------------------------------- Encounter Discharge Information Details Patient Name: Date of Service: [name] V IS, [name] 02/16/2021 10:15 A M Medical Record Number: [id number] Patient Account Number: [id number] Date of Birth/Sex: Treating RN: Oct 10, 1980 (41 y.o. [name] Primary Care Lulabelle Desta: Gwinda Passe Other Clinician: Referring Pearlean Sabina: Treating Davonte Siebenaler/Extender: Grace Isaac in  Treatment: 3 Encounter Discharge Information Items Post Procedure Vitals Discharge Condition: Stable Temperature (F): 97.7 Ambulatory Status: Wheelchair Pulse (bpm): 82 Discharge Destination: Home Respiratory Rate (breaths/min): 18 Transportation: Other Blood Pressure (mmHg): 105/72 Accompanied By: mother Schedule Follow-up Appointment: Yes Clinical Summary of Care: Patient Declined Electronic Signature(s) Signed: 02/19/2021 4:37:55 PM By: Zenaida Deed RN, BSN Entered By: Zenaida Deed on 02/16/2021 11:39:19 -------------------------------------------------------------------------------- Lower Extremity Assessment Details Patient Name: Date of Service: [name] V IS, [name] 02/16/2021 10:15 A M Medical Record Number: [id number] Patient Account Number: [id number] Date of Birth/Sex: Treating RN: 02-22-1980 (41 y.o. [name] Primary Care Kennith Morss: Gwinda Passe Other Clinician: Referring Dardan Shelton: Treating Marilynn Ekstein/Extender: Tobie Poet Weeks in Treatment: 3 Edema Assessment Assessed: [Left: No] [Right: No] E[Left: dema] [Right: :] Calf Left: Right: Point of Measurement: 34 cm From Medial Instep 45 cm Ankle Left: Right: Point of Measurement: 8 cm From Medial Instep 26 cm Vascular Assessment Pulses: Dorsalis Pedis Palpable: [Right:Yes] Electronic Signature(s) Signed: 02/19/2021 4:37:55 PM By: Zenaida Deed RN, BSN Entered By: Zenaida Deed on 02/16/2021 10:40:09 -------------------------------------------------------------------------------- Multi Wound Chart Details Patient Name: Date of Service: [name] V IS, [name] 02/16/2021 10:15 A M Medical Record Number: [id number] Patient Account Number: [id number] Date of Birth/Sex: Treating RN: May 29, 1980 (41 y.o. [name] Primary Care Carlyon Nolasco: Gwinda Passe Other Clinician: Referring Aesha Agrawal: Treating Cecil Bixby/Extender: Grace Isaac in  Treatment: 3 Vital Signs Height(in): 69 Pulse(bpm): 82 Weight(lbs): Blood Pressure(mmHg): 105/72 Body Mass Index(BMI): Temperature(F): 97.7 Respiratory Rate(breaths/min): 18 Photos: [1:Right Amputation Site -] [N/A:N/A N/A] Wound Location: [1:Transmetatarsal Surgical Injury] [N/A:N/A] Wounding Event: [1:Diabetic Wound/Ulcer of the Lower] [N/A:N/A] Primary Etiology: [1:Extremity Hypertension, Type II Diabetes,] [N/A:N/A] Comorbid History: [1:Osteomyelitis, Neuropathy 12/15/2020] [N/A:N/A] Date Acquired: [1:3] [N/A:N/A] Weeks of Treatment: [1:Open] [N/A:N/A] Wound Status: [1:No] [N/A:N/A] Wound Recurrence: [1:4x10.5x3.5] [N/A:N/A] Measurements L x W x D (cm) [1:32.987] [N/A:N/A] A (cm) : rea [1:115.454] [N/A:N/A] Volume (cm) : [1:22.50%] [N/A:N/A] % Reduction in A [  1:rea: -69.60%] [N/A:N/A] % Reduction in Volume: [1:Grade 2] [N/A:N/A] Classification: [1:Medium] [N/A:N/A] Exudate A mount: [1:Purulent] [N/A:N/A] Exudate Type: [1:yellow, brown, green] [N/A:N/A] Exudate Color: [1:Thickened] [N/A:N/A] Wound Margin: [1:Small (1-33%)] [N/A:N/A] Granulation A mount: [1:Red, Pink] [N/A:N/A] Granulation Quality: [1:Large (67-100%)] [N/A:N/A] Necrotic A mount: [1:Eschar, Adherent Slough] [N/A:N/A] Necrotic Tissue: [1:Fat Layer (Subcutaneous Tissue): Yes N/A] Exposed Structures: [1:Fascia: No Tendon: No Muscle: No Joint: No Bone: No None] [N/A:N/A] Epithelialization: [1:Debridement - Excisional] [N/A:N/A] Debridement: Pre-procedure Verification/Time Out 11:10 [N/A:N/A] Taken: [1:Other] [N/A:N/A] Pain Control: [1:Necrotic/Eschar, Callus,] [N/A:N/A] Tissue Debrided: [1:Subcutaneous, Slough Skin/Subcutaneous Tissue] [N/A:N/A] Level: [1:42] [N/A:N/A] Debridement A (sq cm): [1:rea Curette, Forceps, Scissors] [N/A:N/A] Instrument: [1:Minimum] [N/A:N/A] Bleeding: [1:Pressure] [N/A:N/A] Hemostasis Achieved: [1:6] [N/A:N/A] Procedural Pain: [1:2] [N/A:N/A] Post Procedural  Pain: Debridement Treatment Response: Procedure was tolerated well [N/A:N/A] Post Debridement Measurements L x 4x10.5x3.5 [N/A:N/A] W x D (cm) [1:115.454] [N/A:N/A] Post Debridement Volume: (cm) [1:Debridement] [N/A:N/A] Treatment Notes Wound #1 (Amputation Site - Transmetatarsal) Wound Laterality: Right Cleanser Normal Saline Discharge Instruction: Cleanse the wound with Normal Saline prior to applying a clean dressing using gauze sponges, not tissue or cotton balls. Peri-Wound Care Topical Dakin's solution Discharge Instruction: Moisten Gauze with Dakins Primary Dressing Secondary Dressing Woven Gauze Sponge, Non-Sterile 4x4 in Discharge Instruction: Apply over primary dressing as directed. ABD Pad, 5x9 Discharge Instruction: Apply over primary dressing as directed. Secured With [employer], 4.5x3.1 (in/yd) Discharge Instruction: Secure with Kerlix as directed. Transpore Surgical Tape, 2x10 (in/yd) Discharge Instruction: Secure dressing with tape as directed. Compression Wrap Compression Stockings Add-Ons Electronic Signature(s) Signed: 02/16/2021 12:49:38 PM By: Geralyn Corwin DO Signed: 02/19/2021 4:37:55 PM By: Zenaida Deed RN, BSN Entered By: Geralyn Corwin on 02/16/2021 12:38:35 -------------------------------------------------------------------------------- Pain Assessment Details Patient Name: Date of Service: [name] Seth Bake IS, [name] 02/16/2021 10:15 A M Medical Record Number: [id number] Patient Account Number: [id number] Date of Birth/Sex: Treating RN: 02-Feb-1980 (41 y.o. [name] Primary Care Simra Fiebig: Gwinda Passe Other Clinician: Referring Briyah Wheelwright: Treating Ariella Voit/Extender: Grace Isaac in Treatment: 3 Active Problems Location of Pain Severity and Description of Pain Patient Has Paino No Site Locations Rate the pain. Current Pain Level: 0 Worst Pain Level: 7 Least Pain Level: 0 Character of  Pain Describe the Pain: Sharp Pain Management and Medication Current Pain Management: Medication: Yes [name] the Current Pain Management Adequate: Adequate Rest: Yes How does your wound impact your activities of daily livingo Sleep: Yes Bathing: No Appetite: No Relationship With Others: No Bladder Continence: No Emotions: No Bowel Continence: No Hobbies: Yes Toileting: No Dressing: No Electronic Signature(s) Signed: 02/19/2021 4:37:55 PM By: Zenaida Deed RN, BSN Entered By: Zenaida Deed on 02/16/2021 10:35:42 -------------------------------------------------------------------------------- Wound Assessment Details Patient Name: Date of Service: [name] V IS, [name] 02/16/2021 10:15 A M Medical Record Number: [id number] Patient Account Number: [id number] Date of Birth/Sex: Treating RN: 1980-04-01 (41 y.o. [name] Primary Care Carlene Bickley: Gwinda Passe Other Clinician: Referring Numan Zylstra: Treating Arnav Cregg/Extender: Grace Isaac in Treatment: 3 Wound Status Wound Number: 1 Primary Etiology: Diabetic Wound/Ulcer of the Lower Extremity Wound Location: Right Amputation Site - Transmetatarsal Wound Status: Open Wounding Event: Surgical Injury Comorbid Hypertension, Type II Diabetes, Osteomyelitis, History: Neuropathy Date Acquired: 12/15/2020 Weeks Of Treatment: 3 Clustered Wound: No Photos Wound Measurements Length: (cm) 4 Width: (cm) 10.5 Depth: (cm) 3.5 Area: (cm) 32.987 Volume: (cm) 115.454 % Reduction in Area: 22.5% % Reduction in Volume: -69.6% Epithelialization: None Tunneling: No Undermining: No Wound Description Classification: Grade 2 Wound Margin: Thickened  Exudate Amount: Medium Exudate Type: Purulent Exudate Color: yellow, brown, green Foul Odor After Cleansing: No Slough/Fibrino Yes Wound Bed Granulation Amount: Small (1-33%) Exposed Structure Granulation Quality: Red, Pink Fascia Exposed: No Necrotic  Amount: Large (67-100%) Fat Layer (Subcutaneous Tissue) Exposed: Yes Necrotic Quality: Eschar, Adherent Slough Tendon Exposed: No Muscle Exposed: No Joint Exposed: No Bone Exposed: No Treatment Notes Wound #1 (Amputation Site - Transmetatarsal) Wound Laterality: Right Cleanser Normal Saline Discharge Instruction: Cleanse the wound with Normal Saline prior to applying a clean dressing using gauze sponges, not tissue or cotton balls. Peri-Wound Care Topical Dakin's solution Discharge Instruction: Moisten Gauze with Dakins Primary Dressing Secondary Dressing Woven Gauze Sponge, Non-Sterile 4x4 in Discharge Instruction: Apply over primary dressing as directed. ABD Pad, 5x9 Discharge Instruction: Apply over primary dressing as directed. Secured With [employer], 4.5x3.1 (in/yd) Discharge Instruction: Secure with Kerlix as directed. Transpore Surgical Tape, 2x10 (in/yd) Discharge Instruction: Secure dressing with tape as directed. Compression Wrap Compression Stockings Add-Ons Electronic Signature(s) Signed: 02/19/2021 4:37:55 PM By: Zenaida Deed RN, BSN Entered By: Zenaida Deed on 02/16/2021 10:41:51 -------------------------------------------------------------------------------- Vitals Details Patient Name: Date of Service: [name] V IS, [name] 02/16/2021 10:15 A M Medical Record Number: [id number] Patient Account Number: [id number] Date of Birth/Sex: Treating RN: [birth year] (41 y.o. [name] Primary Care Journie Howson: Gwinda Passe Other Clinician: Referring Rudolfo Brandow: Treating Burgundy Matuszak/Extender: Grace Isaac in Treatment: 3 Vital Signs Time Taken: 10:24 Temperature (F): 97.7 Height (in): 69 Pulse (bpm): 82 Source: Stated Respiratory Rate (breaths/min): 18 Blood Pressure (mmHg): 105/72 Reference Range: 80 - 120 mg / dl Notes has not checked blood sugar in several days Electronic Signature(s) Signed: 02/19/2021  4:37:55 PM By: Zenaida Deed RN, BSN Entered By: Zenaida Deed on 02/16/2021 10:34:48

## 2021-02-19 NOTE — Progress Notes (Signed)
Gauna, Pollie L. ([id number]) Visit Report for 02/16/2021 Chief Complaint Document Details Patient Name: Date of Service: [name], [name] [name] 02/16/2021 10:15 A M Medical Record Number: [id number] Patient Account Number: [id number] Date of Birth/Sex: Treating RN: [birth year] (41 y.o. [name] Primary Care Provider: Juluis Mire Other Clinician: Referring Provider: Treating Provider/Extender: Edmonia Lynch in Treatment: 3 Information Obtained from: Patient Chief Complaint Osteomyelitis of the right foot status post transmetatarsal amputation with surgical site dehiscence Electronic Signature(s) Signed: 02/16/2021 12:49:38 PM By: Kalman Shan DO Entered By: Kalman Shan on 02/16/2021 12:38:45 -------------------------------------------------------------------------------- Debridement Details Patient Name: Date of Service: [name], [name] 02/16/2021 10:15 A M Medical Record Number: [id number] Patient Account Number: [id number] Date of Birth/Sex: Treating RN: 1980/03/12 (41 y.o. [name] Primary Care Provider: Juluis Mire Other Clinician: Referring Provider: Treating Provider/Extender: Edmonia Lynch in Treatment: 3 Debridement Performed for Assessment: Wound #1 Right Amputation Site - Transmetatarsal Performed By: Physician Kalman Shan, DO Debridement Type: Debridement Severity of Tissue Pre Debridement: Fat layer exposed Level of Consciousness (Pre-procedure): Awake and Alert Pre-procedure Verification/Time Out Yes - 11:10 Taken: Start Time: 11:10 Pain Control: Other : benzocaine 20% spray T Area Debrided (L x W): otal 4 (cm) x 10.5 (cm) = 42 (cm) Tissue and other material debrided: Viable, Non-Viable, Callus, Eschar, Slough, Subcutaneous, Skin: Epidermis, Slough Level: Skin/Subcutaneous Tissue Debridement Description: Excisional Instrument: Curette, Forceps, Scissors Bleeding:  Minimum Hemostasis Achieved: Pressure Procedural Pain: 6 Post Procedural Pain: 2 Response to Treatment: Procedure was tolerated well Level of Consciousness (Post- Awake and Alert procedure): Post Debridement Measurements of Total Wound Length: (cm) 4 Width: (cm) 10.5 Depth: (cm) 3.5 Volume: (cm) 115.454 Character of Wound/Ulcer Post Debridement: Requires Further Debridement Severity of Tissue Post Debridement: Fat layer exposed Post Procedure Diagnosis Same as Pre-procedure Electronic Signature(s) Signed: 02/16/2021 12:49:38 PM By: Kalman Shan DO Signed: 02/19/2021 4:37:55 PM By: Baruch Gouty RN, BSN Entered By: Baruch Gouty on 02/16/2021 11:15:04 -------------------------------------------------------------------------------- HPI Details Patient Name: Date of Service: [name], [name] 02/16/2021 10:15 A M Medical Record Number: [id number] Patient Account Number: [id number] Date of Birth/Sex: Treating RN: 18-Nov-1980 (41 y.o. [name] Primary Care Provider: Juluis Mire Other Clinician: Referring Provider: Treating Provider/Extender: Edmonia Lynch in Treatment: 3 History of Present Illness HPI Description: Admission 01/22/2021 [name] is a 41 year old female with a past medical history of insulin-dependent uncontrolled type 2 diabetes with last hemoglobin A1c of 13.5, osteomyelitis of the right foot status post transmetatarsal amputation on 12/18/2020 that presents to the clinic for right foot wound. She has had dehiscence of the surgical site. She is currently using wet-to-dry dressings. She has a PICC line and receiving IV ceftriaxone daily for her osteomyelitis. There is an end date of 01/27/2021. She is also taking oral metronidazole. She currently denies systemic signs of infection. 1/19; patient presents for follow-up. She was diagnosed with a DVT to the right lower extremity 2 days ago. She is on Eliquis now. She is  scheduled to see her infectious disease doctor tomorrow. She has been using Dakin's wet-to-dry dressings. She denies systemic signs of infection. 1/26; patient presents for follow-up. She saw infectious disease on 1/21 started on Augmentin. Her PICC line and IV ceftriaxone was discontinued. Patient reports stability to her wound. She has been using Dakin's wet-to-dry dressings. She currently denies systemic signs of infection. 2/3; patient presents for follow-up. She continues to use Dakin's wet-to-dry dressings to  the wound bed. She saw Dr. Yvette Rack with infectious disease yesterday and is continuing Augmentin. T entative end date is 2/16. Patient reports following up with orthopedics. She states there is no further plan from them. She currently denies systemic signs of infection. Electronic Signature(s) Signed: 02/16/2021 12:49:38 PM By: Kalman Shan DO Entered By: Kalman Shan on 02/16/2021 12:43:00 -------------------------------------------------------------------------------- Physical Exam Details Patient Name: Date of Service: [name], [name] 02/16/2021 10:15 A M Medical Record Number: [id number] Patient Account Number: [id number] Date of Birth/Sex: Treating RN: [birth year] (41 y.o. [name] Primary Care Provider: Juluis Mire Other Clinician: Referring Provider: Treating Provider/Extender: Edmonia Lynch in Treatment: 3 Constitutional respirations regular, non-labored and within target range for patient.. Cardiovascular 2+ dorsalis pedis/posterior tibialis pulses. Psychiatric pleasant and cooperative. Notes Right foot: Transmetatarsal surgical wound site is completely dehisced. There is probing to bone. There is nonviable tissue throughout the wound bed. Purulent drainage on palpation. Electronic Signature(s) Signed: 02/16/2021 12:49:38 PM By: Kalman Shan DO Entered By: Kalman Shan on 02/16/2021  12:43:36 -------------------------------------------------------------------------------- Physician Orders Details Patient Name: Date of Service: [name], [name] 02/16/2021 10:15 A M Medical Record Number: [id number] Patient Account Number: [id number] Date of Birth/Sex: Treating RN: 19-Jul-1980 (41 y.o. [name] Primary Care Provider: Juluis Mire Other Clinician: Referring Provider: Treating Provider/Extender: Edmonia Lynch in Treatment: 3 Verbal / Phone Orders: No Diagnosis Coding ICD-10 Coding Code Description L97.514 Non-pressure chronic ulcer of other part of right foot with necrosis of bone E11.621 Type 2 diabetes mellitus with foot ulcer M86.9 Osteomyelitis, unspecified Follow-up Appointments ppointment in 1 week. - with Dr. Heber [place] Return A Bathing/ Shower/ Hygiene Other Bathing/Shower/Hygiene Orders/Instructions: - Clean with saline or Dakins Edema Control - Lymphedema / SCD / Other Elevate legs to the level of the heart or above for 30 minutes daily and/or when sitting, a frequency of: - throughout the day Avoid standing for long periods of time. Moisturize legs daily. Additional Orders / Instructions Follow Nutritious Diet - -Monitor/Control Blood Sugar -High Protein Diet Wound Treatment Wound #1 - Amputation Site - Transmetatarsal Wound Laterality: Right Cleanser: Normal Saline 2 x Per [id number] Days Discharge Instructions: Cleanse the wound with Normal Saline prior to applying a clean dressing using gauze sponges, not tissue or cotton balls. Topical: Dakin's solution 2 x Per Day/15 Days Discharge Instructions: Moisten Gauze with Dakins Secondary Dressing: Woven Gauze Sponge, Non-Sterile 4x4 in 2 x Per Day/15 Days Discharge Instructions: Apply over primary dressing as directed. Secondary Dressing: ABD Pad, 5x9 2 x Per Day/15 Days Discharge Instructions: Apply over primary dressing as directed. Secured With: [employer], 4.5x3.1 (in/yd) 2 x Per Day/15 Days Discharge Instructions: Secure with Kerlix as directed. Secured With: Transpore Surgical Tape, 2x10 (in/yd) 2 x Per Day/15 Days Discharge Instructions: Secure dressing with tape as directed. Radiology MRI, lower extremity with/without contrast right foot - diabetic foot ulcer right transmetatarsal amp site, open surgical wound right foot CPT - (ICD10 E11.621 - Type 2 diabetes mellitus with foot ulcer) Electronic Signature(s) Signed: 02/16/2021 12:49:38 PM By: Kalman Shan DO Entered By: Kalman Shan on 02/16/2021 12:46:59 Prescription 02/16/2021 -------------------------------------------------------------------------------- Dragone, Fenton Kalman Shan DO Patient Name: Provider: [birth year] [id number] Date of Birth: NPI#: [id number] Sex: DEA #: [id number] Phone #: License #: [place] Patient Address: [place] [address] [address] , [address] [place], [place] [address] [phone] Allergies  mango Provider's Orders MRI, lower extremity with/without contrast right foot - ICD10: E11.621 - diabetic foot ulcer right transmetatarsal amp site, open surgical wound right foot CPT Hand Signature: Date(s): Electronic Signature(s) Signed: 02/16/2021 12:49:38 PM By: Kalman Shan DO Entered By: Kalman Shan on 02/16/2021 12:46:59 -------------------------------------------------------------------------------- Problem List Details Patient Name: Date of Service: [name], [name] 02/16/2021 10:15 A M Medical Record Number: [id number] Patient Account Number: [id number] Date of Birth/Sex: Treating RN: June 26, 1980 (41 y.o. [name] Primary Care Provider: Juluis Mire Other Clinician: Referring Provider: Treating Provider/Extender: Edmonia Lynch in Treatment: 3 Active Problems ICD-10 Encounter Code  Description Active Date MDM Diagnosis L97.514 Non-pressure chronic ulcer of other part of right foot with necrosis of bone 01/22/2021 No Yes E11.621 Type 2 diabetes mellitus with foot ulcer 01/22/2021 No Yes M86.9 Osteomyelitis, unspecified 01/22/2021 No Yes Inactive Problems Resolved Problems Electronic Signature(s) Signed: 02/16/2021 12:49:38 PM By: Kalman Shan DO Entered By: Kalman Shan on 02/16/2021 12:38:26 -------------------------------------------------------------------------------- Progress Note Details Patient Name: Date of Service: [name], [name] 02/16/2021 10:15 A M Medical Record Number: [id number] Patient Account Number: [id number] Date of Birth/Sex: Treating RN: 17-May-1980 (41 y.o. [name] Primary Care Provider: Juluis Mire Other Clinician: Referring Provider: Treating Provider/Extender: Edmonia Lynch in Treatment: 3 Subjective Chief Complaint Information obtained from Patient Osteomyelitis of the right foot status post transmetatarsal amputation with surgical site dehiscence History of Present Illness (HPI) Admission 01/22/2021 [name] is a 41 year old female with a past medical history of insulin-dependent uncontrolled type 2 diabetes with last hemoglobin A1c of 13.5, osteomyelitis of the right foot status post transmetatarsal amputation on 12/18/2020 that presents to the clinic for right foot wound. She has had dehiscence of the surgical site. She is currently using wet-to-dry dressings. She has a PICC line and receiving IV ceftriaxone daily for her osteomyelitis. There is an end date of 01/27/2021. She is also taking oral metronidazole. She currently denies systemic signs of infection. 1/19; patient presents for follow-up. She was diagnosed with a DVT to the right lower extremity 2 days ago. She is on Eliquis now. She is scheduled to see her infectious disease doctor tomorrow. She has been using Dakin's  wet-to-dry dressings. She denies systemic signs of infection. 1/26; patient presents for follow-up. She saw infectious disease on 1/21 started on Augmentin. Her PICC line and IV ceftriaxone was discontinued. Patient reports stability to her wound. She has been using Dakin's wet-to-dry dressings. She currently denies systemic signs of infection. 2/3; patient presents for follow-up. She continues to use Dakin's wet-to-dry dressings to the wound bed. She saw Dr. Yvette Rack with infectious disease yesterday and is continuing Augmentin. T entative end date is 2/16. Patient reports following up with orthopedics. She states there is no further plan from them. She currently denies systemic signs of infection. Patient History Information obtained from Patient. Family History Cancer - Paternal Grandparents, Diabetes - Mother, Hypertension - Mother, Stroke - Maternal Grandparents, No family history of Heart Disease, Hereditary Spherocytosis, Kidney Disease, Lung Disease, Seizures, Thyroid Problems, Tuberculosis. Social History Never smoker, Marital Status - Single, Alcohol Use - Rarely, Drug Use - Prior History - Marijuana, Caffeine Use - Daily. Medical History Cardiovascular Patient has history of Hypertension Endocrine Patient has history of Type II Diabetes Musculoskeletal Patient has history of Osteomyelitis - Right Transmet 12/18/20 Neurologic Patient has history of Neuropathy Objective Constitutional respirations regular, non-labored and within target range for patient.. Vitals Time Taken: 10:24 AM,  Height: 69 in, Source: Stated, Temperature: 97.7 F, Pulse: 82 bpm, Respiratory Rate: 18 breaths/min, Blood Pressure: 105/72 mmHg. General Notes: has not checked blood sugar in several days Cardiovascular 2+ dorsalis pedis/posterior tibialis pulses. Psychiatric pleasant and cooperative. General Notes: Right foot: Transmetatarsal surgical wound site is completely dehisced. There is probing to  bone. There is nonviable tissue throughout the wound bed. Purulent drainage on palpation. Integumentary (Hair, Skin) Wound #1 status is Open. Original cause of wound was Surgical Injury. The date acquired was: 12/15/2020. The wound has been in treatment 3 weeks. The wound is located on the Right Amputation Site - Transmetatarsal. The wound measures 4cm length x 10.5cm width x 3.5cm depth; 32.987cm^2 area and 115.454cm^3 volume. There is Fat Layer (Subcutaneous Tissue) exposed. There is no tunneling or undermining noted. There is a medium amount of purulent drainage noted. The wound margin is thickened. There is small (1-33%) red, pink granulation within the wound bed. There is a large (67-100%) amount of necrotic tissue within the wound bed including Eschar and Adherent Slough. Assessment Active Problems ICD-10 Non-pressure chronic ulcer of other part of right foot with necrosis of bone Type 2 diabetes mellitus with foot ulcer Osteomyelitis, unspecified Patient's wound is stable. I debrided nonviable tissue. She still has purulent drainage on palpation. She is on Augmentin per ID. At this time I recommended obtaining an MRI for further evaluation of a potential abscess and reevaluation of her osteomyelitis. I recommended continuing Dakin's wet-to-dry dressing and aggressive offloading. Follow-up in 1 week. Procedures Wound #1 Pre-procedure diagnosis of Wound #1 is a Diabetic Wound/Ulcer of the Lower Extremity located on the Right Amputation Site - Transmetatarsal .Severity of Tissue Pre Debridement is: Fat layer exposed. There was a Excisional Skin/Subcutaneous Tissue Debridement with a total area of 42 sq cm performed by Kalman Shan, DO. With the following instrument(s): Curette, Forceps, and Scissors to remove Viable and Non-Viable tissue/material. Material removed includes Eschar, Callus, Subcutaneous Tissue, Slough, and Skin: Epidermis after achieving pain control using Other  (benzocaine 20% spray). A time out was conducted at 11:10, prior to the start of the procedure. A Minimum amount of bleeding was controlled with Pressure. The procedure was tolerated well with a pain level of 6 throughout and a pain level of 2 following the procedure. Post Debridement Measurements: 4cm length x 10.5cm width x 3.5cm depth; 115.454cm^3 volume. Character of Wound/Ulcer Post Debridement requires further debridement. Severity of Tissue Post Debridement is: Fat layer exposed. Post procedure Diagnosis Wound #1: Same as Pre-Procedure Plan Follow-up Appointments: Return Appointment in 1 week. - with Dr. Heber [place] Bathing/ Shower/ Hygiene: Other Bathing/Shower/Hygiene Orders/Instructions: - Clean with saline or Dakins Edema Control - Lymphedema / SCD / Other: Elevate legs to the level of the heart or above for 30 minutes daily and/or when sitting, a frequency of: - throughout the day Avoid standing for long periods of time. Moisturize legs daily. Additional Orders / Instructions: Follow Nutritious Diet - -Monitor/Control Blood Sugar -High Protein Diet Radiology ordered were: MRI, lower extremity with/without contrast right foot - diabetic foot ulcer right transmetatarsal amp site, open surgical wound right foot CPT WOUND #1: - Amputation Site - Transmetatarsal Wound Laterality: Right Cleanser: Normal Saline 2 x Per Day/15 Days Discharge Instructions: Cleanse the wound with Normal Saline prior to applying a clean dressing using gauze sponges, not tissue or cotton balls. Topical: Dakin's solution 2 x Per Day/15 Days Discharge Instructions: Moisten Gauze with Dakins Secondary Dressing: Woven Gauze Sponge, Non-Sterile 4x4 in 2 x Per Day/15  Days Discharge Instructions: Apply over primary dressing as directed. Secondary Dressing: ABD Pad, 5x9 2 x Per Day/15 Days Discharge Instructions: Apply over primary dressing as directed. Secured With: [employer], 4.5x3.1 (in/yd) 2 x Per  Day/15 Days Discharge Instructions: Secure with Kerlix as directed. Secured With: Transpore Surgical T ape, 2x10 (in/yd) 2 x Per Day/15 Days Discharge Instructions: Secure dressing with tape as directed. 1. In office sharp debridement 2. MRI of the right transmetatarsal site with and without contrast 3. Dakin's wet-to-dry 4. Follow-up in 1 week 5. Aggressive offloading Electronic Signature(s) Signed: 02/16/2021 12:49:38 PM By: Kalman Shan DO Entered By: Kalman Shan on 02/16/2021 12:48:34 -------------------------------------------------------------------------------- HxROS Details Patient Name: Date of Service: [name], [name] 02/16/2021 10:15 A M Medical Record Number: [id number] Patient Account Number: [id number] Date of Birth/Sex: Treating RN: 12-04-1980 (41 y.o. [name] Primary Care Provider: Juluis Mire Other Clinician: Referring Provider: Treating Provider/Extender: Edmonia Lynch in Treatment: 3 Information Obtained From Patient Cardiovascular Medical History: Positive for: Hypertension Endocrine Medical History: Positive for: Type II Diabetes Time with diabetes: Dx 2009 Treated with: Insulin, Oral agents Blood sugar tested every day: Yes Tested : daily Musculoskeletal Medical History: Positive for: Osteomyelitis - Right Transmet 12/18/20 Neurologic Medical History: Positive for: Neuropathy Immunizations Pneumococcal Vaccine: Received Pneumococcal Vaccination: No Implantable Devices Yes Family and Social History Cancer: Yes - Paternal Grandparents; Diabetes: Yes - Mother; Heart Disease: No; Hereditary Spherocytosis: No; Hypertension: Yes - Mother; Kidney Disease: No; Lung Disease: No; Seizures: No; Stroke: Yes - Maternal Grandparents; Thyroid Problems: No; Tuberculosis: No; Never smoker; Marital Status - Single; Alcohol Use: Rarely; Drug Use: Prior History - Marijuana; Caffeine Use: Daily; Financial Concerns:  No; Food, Clothing or Shelter Needs: No; Support System Lacking: No; Transportation Concerns: No Electronic Signature(s) Signed: 02/16/2021 12:49:38 PM By: Kalman Shan DO Signed: 02/19/2021 4:37:55 PM By: Baruch Gouty RN, BSN Entered By: Kalman Shan on 02/16/2021 12:43:07 -------------------------------------------------------------------------------- [place] Details Patient Name: Date of Service: [name], [name] 02/16/2021 Medical Record Number: [id number] Patient Account Number: [id number] Date of Birth/Sex: Treating RN: [birth year] (41 y.o. [name] Primary Care Provider: Juluis Mire Other Clinician: Referring Provider: Treating Provider/Extender: Edmonia Lynch in Treatment: 3 Diagnosis Coding ICD-10 Codes Code Description [phone] Non-pressure chronic ulcer of other part of right foot with necrosis of bone E11.621 Type 2 diabetes mellitus with foot ulcer M86.9 Osteomyelitis, unspecified Facility Procedures CPT4 Code: [id number] Description: [id number] - DEB SUBQ TISSUE 20 SQ CM/< ICD-10 Diagnosis Description L97.514 Non-pressure chronic ulcer of other part of right foot with necrosis of bone Modifier: Quantity: 1 CPT4 Code: [id number] Description: [id number] - DEB SUBQ TISS EA ADDL 20CM ICD-10 Diagnosis Description L97.514 Non-pressure chronic ulcer of other part of right foot with necrosis of bone Modifier: Quantity: 2 Physician Procedures : CPT4 Code Description Modifier [id number] - WC PHYS SUBQ TISS 20 SQ CM ICD-10 Diagnosis Description L97.514 Non-pressure chronic ulcer of other part of right foot with necrosis of bone Quantity: 1 : [id number] - WC PHYS SUBQ TISS EA ADDL 20 CM ICD-10 Diagnosis Description L97.514 Non-pressure chronic ulcer of other part of right foot with necrosis of bone Quantity: 2 Electronic Signature(s) Signed: 02/16/2021 12:49:38 PM By: Kalman Shan DO Entered By: Kalman Shan on 02/16/2021  12:49:08

## 2021-02-20 ENCOUNTER — Other Ambulatory Visit (INDEPENDENT_AMBULATORY_CARE_PROVIDER_SITE_OTHER): Payer: Self-pay | Admitting: Primary Care

## 2021-02-20 ENCOUNTER — Ambulatory Visit: Payer: Self-pay

## 2021-02-20 NOTE — Telephone Encounter (Signed)
Copied from CRM [id number]. Topic: Quick Communication - Rx Refill/Question >> Feb 20, 2021  4:10 PM Gaetana Michaelis A wrote: Medication: Rx #: [id number]  apixaban (ELIQUIS) 5 MG TABS tablet [[id number]]    Has the patient contacted their pharmacy? No. The patient was uncertain of the medication's status  (Agent: If no, request that the patient contact the pharmacy for the refill. If patient does not wish to contact the pharmacy document the reason why and proceed with request.) (Agent: If yes, when and what did the pharmacy advise?)  Preferred Pharmacy (with phone number or street name): [facility] [address] [place] [address] Phone: [phone] Fax: [phone] Hours: M-F 7:30a-6:00p  Has the patient been seen for an appointment in the last year OR does the patient have an upcoming appointment? Yes.    Agent: Please be advised that RX refills may take up to 3 business days. We ask that you follow-up with your pharmacy.

## 2021-02-21 ENCOUNTER — Other Ambulatory Visit (INDEPENDENT_AMBULATORY_CARE_PROVIDER_SITE_OTHER): Payer: Self-pay | Admitting: Emergency Medicine

## 2021-02-21 ENCOUNTER — Other Ambulatory Visit: Payer: Self-pay

## 2021-02-21 NOTE — Telephone Encounter (Signed)
Sent to PCP to refill  

## 2021-02-21 NOTE — Telephone Encounter (Signed)
Prescriber not at this practice. Requested Prescriptions  Pending Prescriptions Disp Refills   apixaban (ELIQUIS) 5 MG TABS tablet 74 tablet 0    Sig: Start with two-5mg  tablets twice daily for 7 days. On day 8, switch to one-5mg  tablet twice daily.     Hematology:  Anticoagulants - apixaban Failed - 02/20/2021  4:17 PM      Failed - ALT in normal range and within 360 days    ALT  Date Value Ref Range Status  02/15/2021 5 (L) 6 - 29 U/L Final         Passed - PLT in normal range and within 360 days    Platelets  Date Value Ref Range Status  02/15/2021 313 140 - 400 Thousand/uL Final         Passed - HGB in normal range and within 360 days    Hemoglobin  Date Value Ref Range Status  02/15/2021 12.2 11.7 - 15.5 g/dL Final         Passed - HCT in normal range and within 360 days    HCT  Date Value Ref Range Status  02/15/2021 37.8 35.0 - 45.0 % Final         Passed - Cr in normal range and within 360 days    Creat  Date Value Ref Range Status  02/15/2021 0.71 0.50 - 0.97 mg/dL Final         Passed - AST in normal range and within 360 days    AST  Date Value Ref Range Status  02/15/2021 10 10 - 30 U/L Final         Passed - Valid encounter within last 12 months    Recent Outpatient Visits          1 week ago Type 2 diabetes mellitus with hyperglycemia, with long-term current use of insulin [facility])   [place], Jarome Matin, RPH-CPP   1 week ago Hospital discharge follow-up   [place], Michelle P, NP   1 month ago Type 2 diabetes mellitus with hyperglycemia, unspecified whether long term insulin use ([place])   [place], [place], NP      Future Appointments            In 3 weeks Tresa Endo, [place]   In 1 month [place], Milford Cage, NP [place]

## 2021-02-21 NOTE — Telephone Encounter (Signed)
Requested medication (s) are on the active medication list: yes  Last refill:  01/30/21 #74 with 0 RF  Future visit scheduled: 04/17/21  Notes to clinic:  Provider not in this practice, post hospitalization. Please assess.   Requested Prescriptions  Pending Prescriptions Disp Refills   apixaban (ELIQUIS) 5 MG TABS tablet 74 tablet 0    Sig: Start with two-5mg  tablets twice daily for 7 days. On day 8, switch to one-5mg  tablet twice daily.     Hematology:  Anticoagulants - apixaban Failed - 02/21/2021  5:18 AM      Failed - ALT in normal range and within 360 days    ALT  Date Value Ref Range Status  02/15/2021 5 (L) 6 - 29 U/L Final          Passed - PLT in normal range and within 360 days    Platelets  Date Value Ref Range Status  02/15/2021 313 140 - 400 Thousand/uL Final          Passed - HGB in normal range and within 360 days    Hemoglobin  Date Value Ref Range Status  02/15/2021 12.2 11.7 - 15.5 g/dL Final          Passed - HCT in normal range and within 360 days    HCT  Date Value Ref Range Status  02/15/2021 37.8 35.0 - 45.0 % Final          Passed - Cr in normal range and within 360 days    Creat  Date Value Ref Range Status  02/15/2021 0.71 0.50 - 0.97 mg/dL Final          Passed - AST in normal range and within 360 days    AST  Date Value Ref Range Status  02/15/2021 10 10 - 30 U/L Final          Passed - Valid encounter within last 12 months    Recent Outpatient Visits           1 week ago Type 2 diabetes mellitus with hyperglycemia, with long-term current use of insulin [facility])   [place] [facility] Lois Huxley, Cornelius Moras, RPH-CPP   1 week ago Hospital discharge follow-up   [facility] Grayce Sessions, NP   1 month ago Type 2 diabetes mellitus with hyperglycemia, unspecified whether long term insulin use (HCC)   [facility] Grayce Sessions, NP       Future  Appointments             In 3 weeks Drucilla Chalet, RPH-CPP [employer] [facility]   In 1 month Jerm[place], Kinnie Scales, NP [facility]            Refused Prescriptions Disp Refills   apixaban (ELIQUIS) 5 MG TABS tablet 74 tablet 0    Sig: Start with two-5mg  tablets twice daily for 7 days. On day 8, switch to one-5mg  tablet twice daily.     Hematology:  Anticoagulants - apixaban Failed - 02/21/2021  5:18 AM      Failed - ALT in normal range and within 360 days    ALT  Date Value Ref Range Status  02/15/2021 5 (L) 6 - 29 U/L Final          Passed - PLT in normal range and within 360 days    Platelets  Date Value Ref Range Status  02/15/2021 313 140 - 400 Thousand/uL Final          Passed - HGB in normal range and within 360 days    Hemoglobin  Date Value Ref Range Status  02/15/2021 12.2 11.7 - 15.5 g/dL Final          Passed - HCT in normal range and within 360 days    HCT  Date Value Ref Range Status  02/15/2021 37.8 35.0 - 45.0 % Final          Passed - Cr in normal range and within 360 days    Creat  Date Value Ref Range Status  02/15/2021 0.71 0.50 - 0.97 mg/dL Final          Passed - AST in normal range and within 360 days    AST  Date Value Ref Range Status  02/15/2021 10 10 - 30 U/L Final          Passed - Valid encounter within last 12 months    Recent Outpatient Visits           1 week ago Type 2 diabetes mellitus with hyperglycemia, with long-term current use of insulin [facility])   [place] [facility] Lois Huxley, Cornelius Moras, RPH-CPP   1 week ago Hospital discharge follow-up   [facility] Grayce Sessions, NP   1 month ago Type 2 diabetes mellitus with hyperglycemia, unspecified whether long term insulin use (HCC)   [facility] Grayce Sessions, NP       Future Appointments             In 3 weeks Drucilla Chalet,  RPH-CPP [facility]   In 1 month [place], Kinnie Scales, NP [facility]

## 2021-02-21 NOTE — Addendum Note (Signed)
Addended by: Erick Colace on: 02/21/2021 05:18 AM   Modules accepted: Orders

## 2021-02-22 ENCOUNTER — Other Ambulatory Visit: Payer: Self-pay

## 2021-02-22 MED ORDER — APIXABAN 5 MG PO TABS
ORAL_TABLET | ORAL | 1 refills | Status: DC
Start: 2021-02-22 — End: 2021-06-26
  Filled 2021-02-22: qty 60, 30d supply, fill #0
  Filled 2021-04-12: qty 60, 30d supply, fill #1
  Filled 2021-06-21: qty 180, 90d supply, fill #2

## 2021-02-23 ENCOUNTER — Other Ambulatory Visit: Payer: Self-pay

## 2021-02-23 ENCOUNTER — Encounter (HOSPITAL_BASED_OUTPATIENT_CLINIC_OR_DEPARTMENT_OTHER): Payer: Medicaid Other | Admitting: Internal Medicine

## 2021-02-23 DIAGNOSIS — E11621 Type 2 diabetes mellitus with foot ulcer: Secondary | ICD-10-CM

## 2021-02-23 DIAGNOSIS — L97514 Non-pressure chronic ulcer of other part of right foot with necrosis of bone: Secondary | ICD-10-CM | POA: Diagnosis not present

## 2021-02-23 DIAGNOSIS — M869 Osteomyelitis, unspecified: Secondary | ICD-10-CM | POA: Diagnosis not present

## 2021-02-23 NOTE — Progress Notes (Signed)
[name], [name] ([id number]) Visit Report for 02/23/2021 Chief Complaint Document Details Patient Name: Date of Service: [name], [name] 02/23/2021 11:00 A M Medical Record Number: [id number] Patient Account Number: [id number] Date of Birth/Sex: Treating RN: 08/14/1980 (41 y.o. [name] Primary Care Provider: Gwinda Passe Other Clinician: Referring Provider: Treating Provider/Extender: Grace Isaac in Treatment: 4 Information Obtained from: Patient Chief Complaint Osteomyelitis of the right foot status post transmetatarsal amputation with surgical site dehiscence Electronic Signature(s) Signed: 02/23/2021 12:39:47 PM By: Geralyn Corwin DO Entered By: Geralyn Corwin on 02/23/2021 12:30:49 -------------------------------------------------------------------------------- HPI Details Patient Name: Date of Service: [name] V IS, [name] 02/23/2021 11:00 A M Medical Record Number: [id number] Patient Account Number: [id number] Date of Birth/Sex: Treating RN: 08-23-1980 (41 y.o. [name] Primary Care Provider: Gwinda Passe Other Clinician: Referring Provider: Treating Provider/Extender: Grace Isaac in Treatment: 4 History of Present Illness HPI Description: Admission 01/22/2021 [name] [name] a 40 year old female with a past medical history of insulin-dependent uncontrolled type 2 diabetes with last hemoglobin A1c of 13.5, osteomyelitis of the right foot status post transmetatarsal amputation on 12/18/2020 that presents to the clinic for right foot wound. She has had dehiscence of the surgical site. [name] using wet-to-dry dressings. She has a PICC line and receiving IV ceftriaxone daily for her osteomyelitis. There [name] an end date of 01/27/2021. [name] also taking oral metronidazole. She currently denies systemic signs of infection. 1/19; patient presents for follow-up. She was diagnosed with a  DVT to the right lower extremity 2 days ago. [name] on Eliquis now. [name] scheduled to see her infectious disease doctor tomorrow. She has been using Dakin's wet-to-dry dressings. She denies systemic signs of infection. 1/26; patient presents for follow-up. She saw infectious disease on 1/21 started on Augmentin. Her PICC line and IV ceftriaxone was discontinued. Patient reports stability to her wound. She has been using Dakin's wet-to-dry dressings. She currently denies systemic signs of infection. 2/3; patient presents for follow-up. She continues to use Dakin's wet-to-dry dressings to the wound bed. She saw Dr. Manson Passey with infectious disease yesterday and [name] continuing Augmentin. T entative end date [name] 2/16. Patient reports following up with orthopedics. She states there [name] no further plan from them. She currently denies systemic signs of infection. 2/10; patient presents for follow-up. She continues to use Dakin's wet to dry dressings. [name] scheduled to have her MRI done on 2/14. She states that she had pain to the debridement site from last clinic visit and declines debridement today. She denies systemic signs of infection. She continues to have yellow thick drainage. Electronic Signature(s) Signed: 02/23/2021 12:39:47 PM By: Geralyn Corwin DO Entered By: Geralyn Corwin on 02/23/2021 12:31:32 -------------------------------------------------------------------------------- Physical Exam Details Patient Name: Date of Service: [name] V IS, [name] 02/23/2021 11:00 A M Medical Record Number: [id number] Patient Account Number: [id number] Date of Birth/Sex: Treating RN: [birth year] (41 y.o. [name] Primary Care Provider: Gwinda Passe Other Clinician: Referring Provider: Treating Provider/Extender: Grace Isaac in Treatment: 4 Constitutional respirations regular, non-labored and within target range for patient.. Cardiovascular 2+ dorsalis  pedis/posterior tibialis pulses. Psychiatric pleasant and cooperative. Notes Right foot: Transmetatarsal surgical wound site [name] completely dehisced. There [name] probing to bone. There [name] nonviable tissue throughout the wound bed. Purulent drainage on palpation. Electronic Signature(s) Signed: 02/23/2021 12:39:47 PM By: Geralyn Corwin DO Entered By: Geralyn Corwin on 02/23/2021 12:32:04 -------------------------------------------------------------------------------- Physician Orders Details  Patient Name: Date of Service: [name], [name] 02/23/2021 11:00 A M Medical Record Number: [id number] Patient Account Number: [id number] Date of Birth/Sex: Treating RN: [birth year] (41 y.o. [name] Primary Care Provider: Gwinda Passe Other Clinician: Referring Provider: Treating Provider/Extender: Grace Isaac in Treatment: 4 Verbal / Phone Orders: No Diagnosis Coding ICD-10 Coding Code Description L97.514 Non-pressure chronic ulcer of other part of right foot with necrosis of bone E11.621 Type 2 diabetes mellitus with foot ulcer M86.9 Osteomyelitis, unspecified Follow-up Appointments ppointment in 1 week. - with Dr. Mikey Bussing Return A Bathing/ Shower/ Hygiene Other Bathing/Shower/Hygiene Orders/Instructions: - Clean with saline or Dakins Edema Control - Lymphedema / SCD / Other Elevate legs to the level of the heart or above for 30 minutes daily and/or when sitting, a frequency of: - throughout the day Avoid standing for long periods of time. Moisturize legs daily. Additional Orders / Instructions Follow Nutritious Diet - -Monitor/Control Blood Sugar -High Protein Diet Wound Treatment Wound #1 - Amputation Site - Transmetatarsal Wound Laterality: Right Cleanser: Normal Saline 2 x Per Day/15 Days Discharge Instructions: Cleanse the wound with Normal Saline prior to applying a clean dressing using gauze sponges, not tissue or cotton balls. Topical:  Dakin's solution 2 x Per Day/15 Days Discharge Instructions: Moisten Gauze with Dakins Secondary Dressing: Woven Gauze Sponge, Non-Sterile 4x4 in 2 x Per Day/15 Days Discharge Instructions: Apply over primary dressing as directed. Secondary Dressing: ABD Pad, 5x9 2 x Per Day/15 Days Discharge Instructions: Apply over primary dressing as directed. Secured With: [employer], 4.5x3.1 (in/yd) 2 x Per Day/15 Days Discharge Instructions: Secure with Kerlix as directed. Secured With: Transpore Surgical Tape, 2x10 (in/yd) 2 x Per Day/15 Days Discharge Instructions: Secure dressing with tape as directed. Electronic Signature(s) Signed: 02/23/2021 12:39:47 PM By: Geralyn Corwin DO Entered By: Geralyn Corwin on 02/23/2021 12:32:21 -------------------------------------------------------------------------------- Problem List Details Patient Name: Date of Service: [name] V IS, [name] 02/23/2021 11:00 A M Medical Record Number: [id number] Patient Account Number: [id number] Date of Birth/Sex: Treating RN: March 31, 1980 (41 y.o. [name] Primary Care Provider: Gwinda Passe Other Clinician: Referring Provider: Treating Provider/Extender: Grace Isaac in Treatment: 4 Active Problems ICD-10 Encounter Code Description Active Date MDM Diagnosis L97.514 Non-pressure chronic ulcer of other part of right foot with necrosis of bone 01/22/2021 No Yes E11.621 Type 2 diabetes mellitus with foot ulcer 01/22/2021 No Yes M86.9 Osteomyelitis, unspecified 01/22/2021 No Yes Inactive Problems Resolved Problems Electronic Signature(s) Signed: 02/23/2021 12:39:47 PM By: Geralyn Corwin DO Previous Signature: 02/23/2021 11:39:31 AM Version By: Antonieta Iba Entered By: Geralyn Corwin on 02/23/2021 12:30:31 -------------------------------------------------------------------------------- Progress Note Details Patient Name: Date of Service: [name] V IS, [name] 02/23/2021  11:00 A M Medical Record Number: [id number] Patient Account Number: [id number] Date of Birth/Sex: Treating RN: [birth year] (41 y.o. [name] Primary Care Provider: Gwinda Passe Other Clinician: Referring Provider: Treating Provider/Extender: Grace Isaac in Treatment: 4 Subjective Chief Complaint Information obtained from Patient Osteomyelitis of the right foot status post transmetatarsal amputation with surgical site dehiscence History of Present Illness (HPI) Admission 01/22/2021 [name] [name] a 41 year old female with a past medical history of insulin-dependent uncontrolled type 2 diabetes with last hemoglobin A1c of 13.5, osteomyelitis of the right foot status post transmetatarsal amputation on 12/18/2020 that presents to the clinic for right foot wound. She has had dehiscence of the surgical site. [name] using wet-to-dry dressings. She has a PICC line  and receiving IV ceftriaxone daily for her osteomyelitis. There [name] an end date of 01/27/2021. [name] also taking oral metronidazole. She currently denies systemic signs of infection. 1/19; patient presents for follow-up. She was diagnosed with a DVT to the right lower extremity 2 days ago. [name] on Eliquis now. [name] scheduled to see her infectious disease doctor tomorrow. She has been using Dakin's wet-to-dry dressings. She denies systemic signs of infection. 1/26; patient presents for follow-up. She saw infectious disease on 1/21 started on Augmentin. Her PICC line and IV ceftriaxone was discontinued. Patient reports stability to her wound. She has been using Dakin's wet-to-dry dressings. She currently denies systemic signs of infection. 2/3; patient presents for follow-up. She continues to use Dakin's wet-to-dry dressings to the wound bed. She saw Dr. Manson Passey with infectious disease yesterday and [name] continuing Augmentin. T entative end date [name] 2/16. Patient reports following up with  orthopedics. She states there [name] no further plan from them. She currently denies systemic signs of infection. 2/10; patient presents for follow-up. She continues to use Dakin's wet to dry dressings. [name] scheduled to have her MRI done on 2/14. She states that she had pain to the debridement site from last clinic visit and declines debridement today. She denies systemic signs of infection. She continues to have yellow thick drainage. Patient History Information obtained from Patient. Family History Cancer - Paternal Grandparents, Diabetes - Mother, Hypertension - Mother, Stroke - Maternal Grandparents, No family history of Heart Disease, Hereditary Spherocytosis, Kidney Disease, Lung Disease, Seizures, Thyroid Problems, Tuberculosis. Social History Never smoker, Marital Status - Single, Alcohol Use - Rarely, Drug Use - Prior History - Marijuana, Caffeine Use - Daily. Medical History Cardiovascular Patient has history of Hypertension Endocrine Patient has history of Type II Diabetes Musculoskeletal Patient has history of Osteomyelitis - Right Transmet 12/18/20 Neurologic Patient has history of Neuropathy Objective Constitutional respirations regular, non-labored and within target range for patient.. Vitals Time Taken: 11:23 AM, Height: 69 in, Temperature: 98.1 F, Pulse: 84 bpm, Respiratory Rate: 18 breaths/min, Blood Pressure: 122/73 mmHg. Cardiovascular 2+ dorsalis pedis/posterior tibialis pulses. Psychiatric pleasant and cooperative. General Notes: Right foot: Transmetatarsal surgical wound site [name] completely dehisced. There [name] probing to bone. There [name] nonviable tissue throughout the wound bed. Purulent drainage on palpation. Integumentary (Hair, Skin) Wound #1 status [name] Open. Original cause of wound was Surgical Injury. The date acquired was: 12/15/2020. The wound has been in treatment 4 weeks. The wound [name] located on the Right Amputation Site - Transmetatarsal. The wound  measures 3.3cm length x 8.2cm width x 3.6cm depth; 21.253cm^2 area and 76.51cm^3 volume. There [name] Fat Layer (Subcutaneous Tissue) exposed. There [name] no undermining noted, however, there [name] tunneling at 5:00 with a maximum distance of 3.5cm. There [name] a medium amount of purulent drainage noted. The wound margin [name] thickened. There [name] medium (34-66%) red, pink granulation within the wound bed. There [name] a medium (34-66%) amount of necrotic tissue within the wound bed including Eschar and Adherent Slough. Assessment Active Problems ICD-10 Non-pressure chronic ulcer of other part of right foot with necrosis of bone Type 2 diabetes mellitus with foot ulcer Osteomyelitis, unspecified Patient's wound [name] stable. She still has purulent drainage on palpation. [name] scheduled to get her MRI in 2/14. I recommended continuing Dakin's wet-to-dry dressing she declined debridement today. Follow-up in 1 week. Plan Follow-up Appointments: Return Appointment in 1 week. - with Dr. Mikey Bussing Bathing/ Shower/ Hygiene: Other Bathing/Shower/Hygiene Orders/Instructions: - Clean with saline or Dakins  Edema Control - Lymphedema / SCD / Other: Elevate legs to the level of the heart or above for 30 minutes daily and/or when sitting, a frequency of: - throughout the day Avoid standing for long periods of time. Moisturize legs daily. Additional Orders / Instructions: Follow Nutritious Diet - -Monitor/Control Blood Sugar -High Protein Diet WOUND #1: - Amputation Site - Transmetatarsal Wound Laterality: Right Cleanser: Normal Saline 2 x Per Day/15 Days Discharge Instructions: Cleanse the wound with Normal Saline prior to applying a clean dressing using gauze sponges, not tissue or cotton balls. Topical: Dakin's solution 2 x Per Day/15 Days Discharge Instructions: Moisten Gauze with Dakins Secondary Dressing: Woven Gauze Sponge, Non-Sterile 4x4 in 2 x Per Day/15 Days Discharge Instructions: Apply over primary dressing as  directed. Secondary Dressing: ABD Pad, 5x9 2 x Per Day/15 Days Discharge Instructions: Apply over primary dressing as directed. Secured With: [employer] Roll Sterile, 4.5x3.1 (in/yd) 2 x Per Day/15 Days Discharge Instructions: Secure with Kerlix as directed. Secured With: Transpore Surgical T ape, 2x10 (in/yd) 2 x Per Day/15 Days Discharge Instructions: Secure dressing with tape as directed. 1. Dakin's wet-to-dry 2. Aggressive offloading 3. MRI scheduled for 2/14 4. Follow-up in 1 week Electronic Signature(s) Signed: 02/23/2021 12:39:47 PM By: Geralyn CorwinHoffman, Michel Eskelson DO Entered By: Geralyn CorwinHoffman, Marisha Renier on 02/23/2021 12:34:36 -------------------------------------------------------------------------------- HxROS Details Patient Name: Date of Service: [name] V IS, [name] 02/23/2021 11:00 A M Medical Record Number: [id number] Patient Account Number: [id number] Date of Birth/Sex: Treating RN: [birth year] (41 y.o. [name] StandardF) [name], [name] Primary Care Provider: Other Clinician: Gwinda PasseEdwards, [name] Referring Provider: Treating Provider/Extender: Grace IsaacHoffman, Kathie Posa [name], [name] Weeks in Treatment: 4 Information Obtained From Patient Cardiovascular Medical History: Positive for: Hypertension Endocrine Medical History: Positive for: Type II Diabetes Time with diabetes: Dx 2009 Treated with: Insulin, Oral agents Blood sugar tested every day: Yes Tested : daily Musculoskeletal Medical History: Positive for: Osteomyelitis - Right Transmet 12/18/20 Neurologic Medical History: Positive for: Neuropathy Immunizations Pneumococcal Vaccine: Received Pneumococcal Vaccination: No Implantable Devices Yes Family and Social History Cancer: Yes - Paternal Grandparents; Diabetes: Yes - Mother; Heart Disease: No; Hereditary Spherocytosis: No; Hypertension: Yes - Mother; Kidney Disease: No; Lung Disease: No; Seizures: No; Stroke: Yes - Maternal Grandparents; Thyroid Problems: No; Tuberculosis: No; Never smoker;  Marital Status - Single; Alcohol Use: Rarely; Drug Use: Prior History - Marijuana; Caffeine Use: Daily; Financial Concerns: No; Food, Clothing or Shelter Needs: No; Support System Lacking: No; Transportation Concerns: No Electronic Signature(s) Signed: 02/23/2021 12:39:47 PM By: Geralyn CorwinHoffman, Kessler Solly DO Signed: 02/23/2021 12:55:08 PM By: Zenaida DeedBoehlein, Linda RN, BSN Entered By: Geralyn CorwinHoffman, Price Lachapelle on 02/23/2021 12:31:41 -------------------------------------------------------------------------------- SuperBill Details Patient Name: Date of Service: [name] Delman KittenV IS, [name] 02/23/2021 Medical Record Number: [id number] Patient Account Number: [id number] Date of Birth/Sex: Treating RN: [birth year] (41 y.o. [name] CluckF) Barnhart, Jodi Primary Care Provider: Gwinda PasseEdwards, [name] Other Clinician: Referring Provider: Treating Provider/Extender: Grace IsaacHoffman, Jilleen Essner [name], [name] Weeks in Treatment: 4 Diagnosis Coding ICD-10 Codes Code Description [phone] Non-pressure chronic ulcer of other part of right foot with necrosis of bone E11.621 Type 2 diabetes mellitus with foot ulcer M86.9 Osteomyelitis, unspecified Facility Procedures CPT4 Code: [id number] Description: 99213 - WOUND CARE VISIT-LEV 3 EST PT Modifier: Quantity: 1 Physician Procedures : CPT4 Code Description Modifier [id number] 99213 - WC PHYS LEVEL 3 - EST PT ICD-10 Diagnosis Description L97.514 Non-pressure chronic ulcer of other part of right foot with necrosis of bone E11.621 Type 2 diabetes mellitus with foot ulcer M86.9  Osteomyelitis, unspecified Quantity: 1 Electronic Signature(s) Signed: 02/23/2021 12:39:47 PM By:  Geralyn CorwinHoffman, Nihal Doan DO Entered By: Geralyn CorwinHoffman, Koven Belinsky on 02/23/2021 12:34:50

## 2021-02-27 ENCOUNTER — Ambulatory Visit (HOSPITAL_COMMUNITY)
Admission: RE | Admit: 2021-02-27 | Discharge: 2021-02-27 | Disposition: A | Discharge status: Home / Self Care | Payer: Self-pay | Source: Ambulatory Visit | Attending: Internal Medicine | Admitting: Internal Medicine

## 2021-02-27 ENCOUNTER — Other Ambulatory Visit: Payer: Self-pay

## 2021-02-27 DIAGNOSIS — L97509 Non-pressure chronic ulcer of other part of unspecified foot with unspecified severity: Secondary | ICD-10-CM | POA: Insufficient documentation

## 2021-02-27 DIAGNOSIS — E11621 Type 2 diabetes mellitus with foot ulcer: Secondary | ICD-10-CM | POA: Insufficient documentation

## 2021-02-27 IMAGING — MR MR FOOT*R* WO/W CM
9 series · 39 of 40 positions shown · IV contrast (gadavist)
Comparison: [DATE]

CLINICAL DATA: Prior amputation. History of diabetes. Evaluate for
recurrent osteomyelitis.

EXAM:
MRI OF THE RIGHT FOREFOOT WITHOUT AND WITH CONTRAST
TECHNIQUE: Multiplanar, multisequence MR imaging of the right forefoot was
performed before and after the administration of intravenous
contrast.
CONTRAST:  10mL GADAVIST GADOBUTROL 1 MMOL/ML IV SOLN

[Series 4: T1 · coronal · right · 3.0mm · 0.47mm/px · 5 of 35 slices shown (1 of 2)]
[im 1/35]
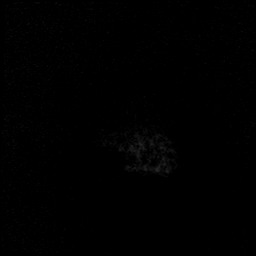
[im 9/35]
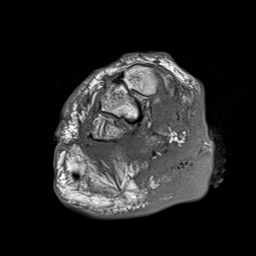
[im 18/35]
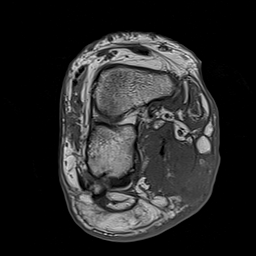
[im 26/35]
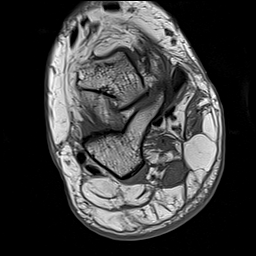
[im 35/35]
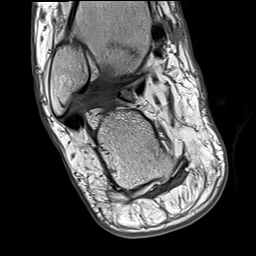

[Series 5: T2 fat-sat · coronal · right · 3.0mm · 0.38mm/px · 5 of 35 slices shown (1 of 2)]
[im 1/35]
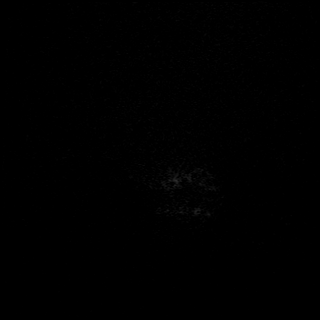
[im 9/35]
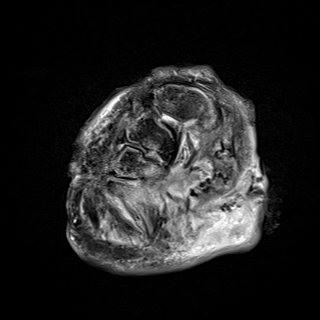
[im 18/35]
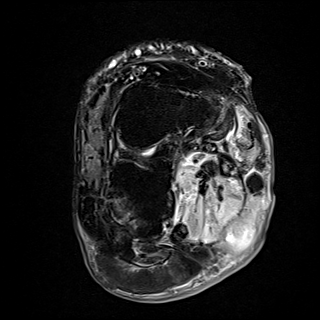
[im 26/35]
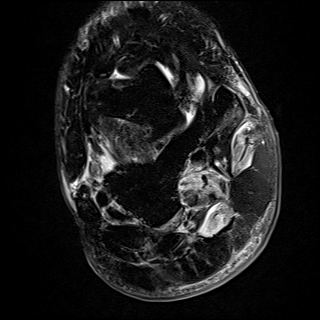
[im 35/35]
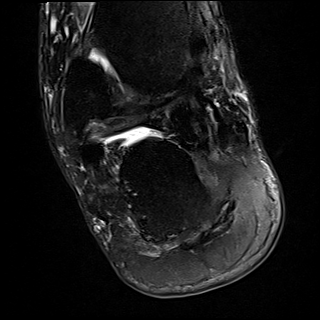

[Series 6: T2 fat-sat · axial · right · 3.0mm · 0.70mm/px · z∈[-103,-19]mm · 4 of 28 slices shown (2 of 2)]
[im 1/28]
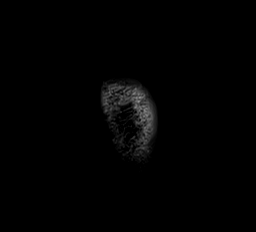
[im 10/28]
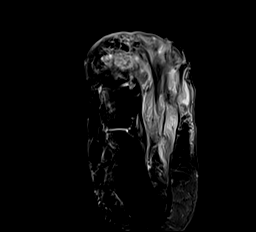
[im 19/28]
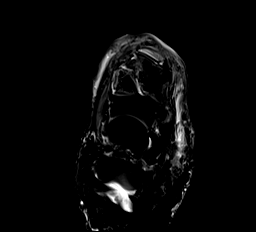
[im 28/28]
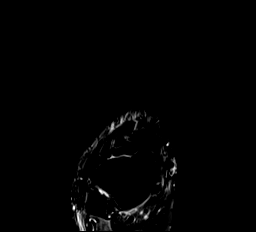

[Series 7: T1 · axial · right · 3.0mm · 0.70mm/px · z∈[-116,-32]mm · 4 of 28 slices shown (2 of 2)]
[im 1/28]
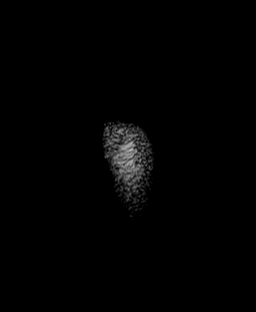
[im 10/28]
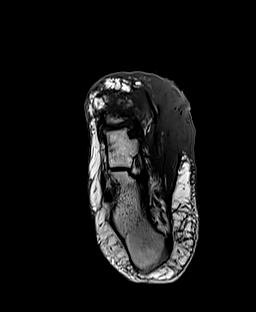
[im 19/28]
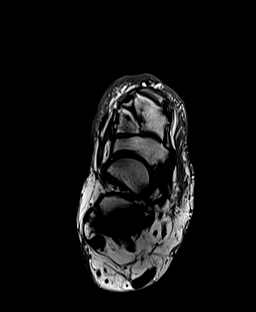
[im 28/28]
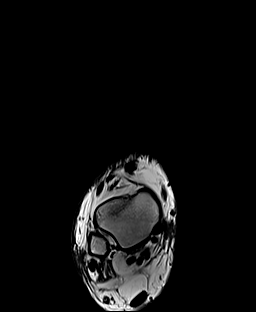

[Series 8: STIR · sagittal · right · 3.0mm · 0.35mm/px · 3 of 27 slices shown]
[im 1/27]
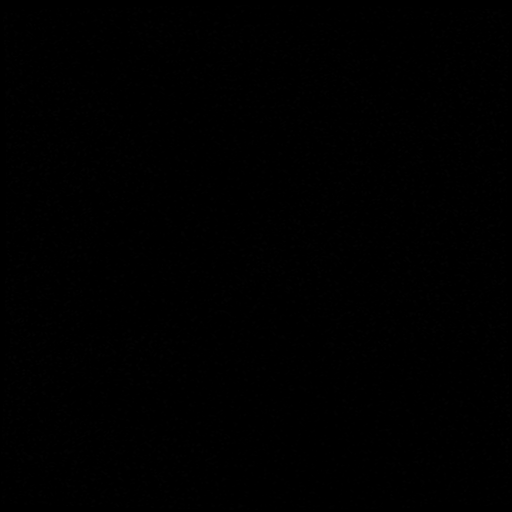
[im 9/27]
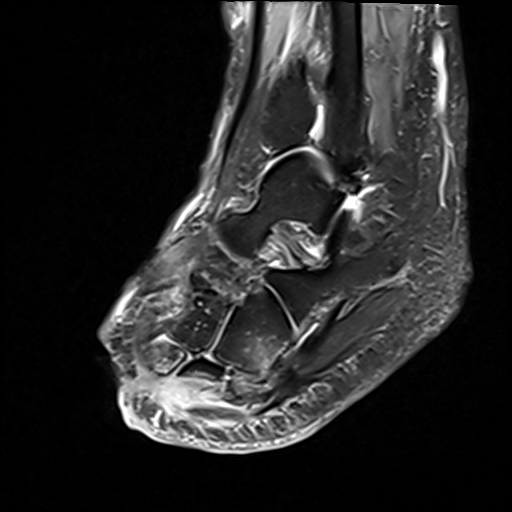
[im 18/27]
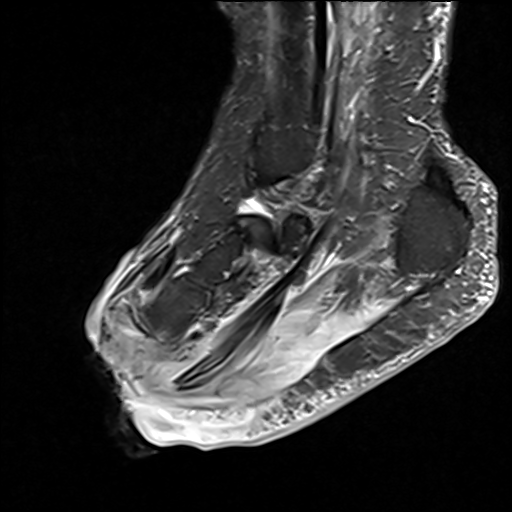

[Series 9: T1 fat-sat · coronal · non-contrast · right · 3.0mm · 0.47mm/px · 5 of 35 slices shown]
[im 1/35]
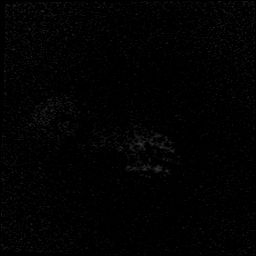
[im 9/35]
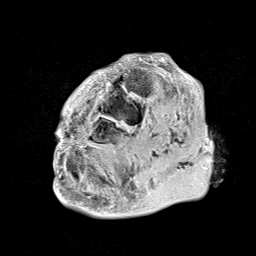
[im 18/35]
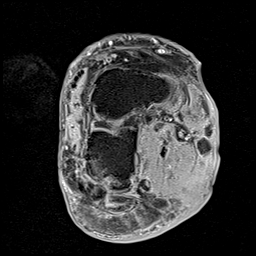
[im 26/35]
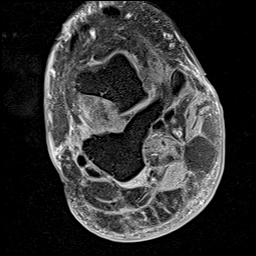
[im 35/35]
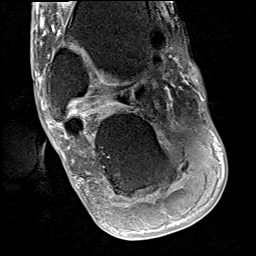

[Series 10: T1 fat-sat post-contrast · coronal · right · 3.0mm · 0.47mm/px · 5 of 35 slices shown (1 of 3)]
[im 1/35]
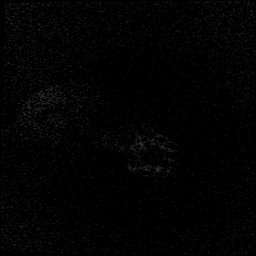
[im 9/35]
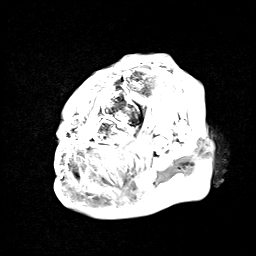
[im 18/35]
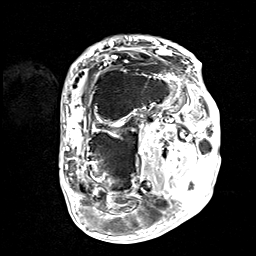
[im 26/35]
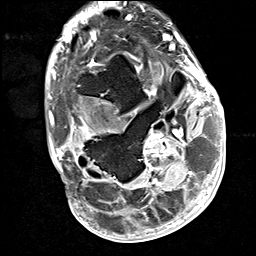
[im 35/35]
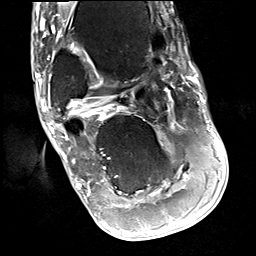

[Series 11: T1 fat-sat post-contrast · sagittal · right · 3.0mm · 0.35mm/px · 4 of 27 slices shown (2 of 3)]
[im 1/27]
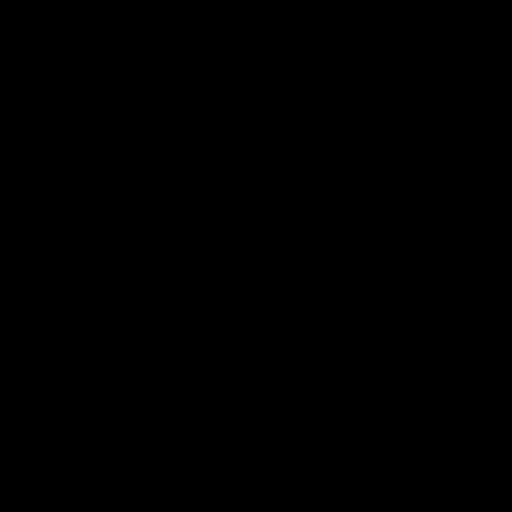
[im 9/27]
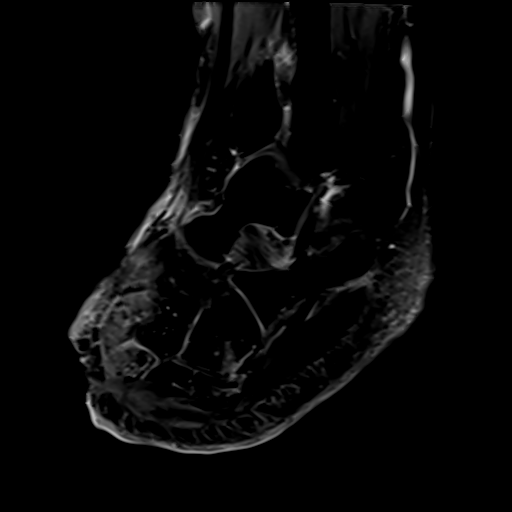
[im 18/27]
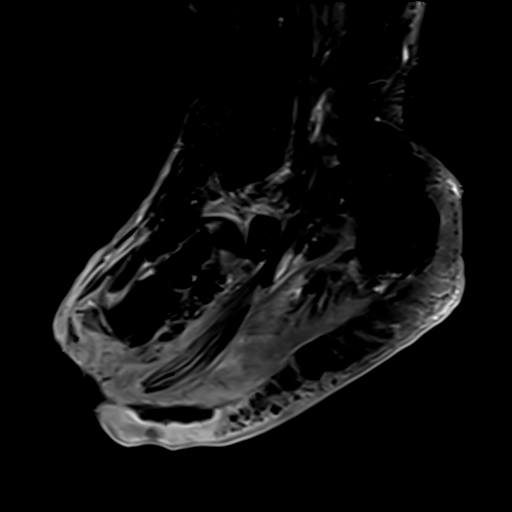
[im 27/27]
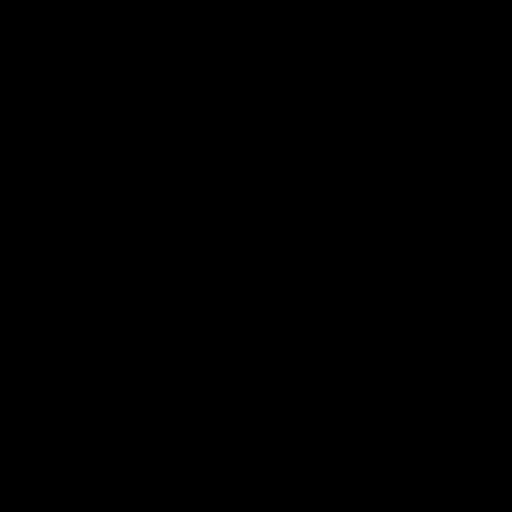

[Series 12: T1 fat-sat post-contrast · axial · right · 3.0mm · 0.56mm/px · z∈[-107,-23]mm · 4 of 28 slices shown (3 of 3)]
[im 1/28]
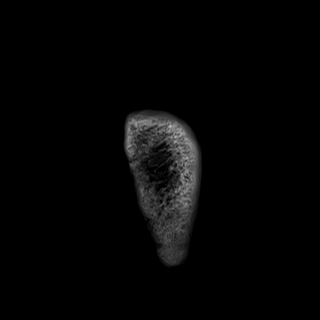
[im 10/28]
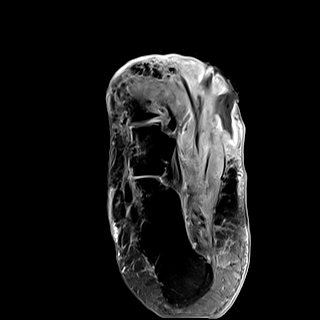
[im 19/28]
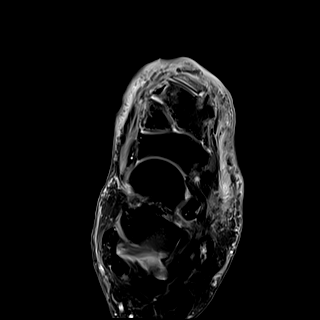
[im 28/28]
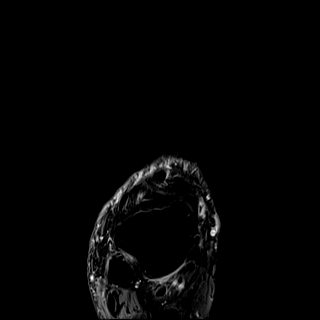

[39 of 40 positions shown; findings below may reference images not displayed]

FINDINGS: Bones/Joint/Cartilage

Large soft tissue wound overlying the transmetatarsal amputation.
Bone marrow edema within the metatarsal stumps. Mild marrow edema
along the distal dorsal plantar aspect of the medial cuneiform. Mild
subchondral marrow edema along the distal dorsal middle cuneiform.
Mild bone marrow edema along the distal plantar aspect of the
cuboid. Severe surrounding soft tissue edema along the amputation
site consistent with cellulitis. Complex fluid collection along the
plantar medial aspect of the foot at the level of the TMT joints
measuring 4.5 x 1 x 2.8 cm with thick peripheral enhancement and
small amount air within the collection with a sinus tract extending
to the skin surface.

Small posterior subtalar joint effusion. No marrow signal
abnormality.

Ligaments

Ligaments are unremarkable.

Muscles and Tendons

T2 hyperintense signal throughout the plantar musculature which may
be neurogenic versus secondary to myositis.

Soft tissue
No other fluid collection or hematoma. No soft tissue mass. Soft
tissue enhancement at the amputation site. Soft tissue edema and
skin thickening involving the dorsal aspect of the foot and to
lesser extent the plantar aspect.
IMPRESSION: 1. Large soft tissue wound overlying the transmetatarsal amputation
with surrounding cellulitis. Bone marrow edema within the metatarsal
stumps concerning for residual osteomyelitis. Complex fluid
collection along the plantar medial aspect of the foot at the level
of the TMT joints measuring 4.5 x 1 x 2.8 cm with thick peripheral
enhancement and small amount air within the collection with a sinus
tract extending to the skin surface consistent with an abscess.
2. Mild marrow edema along the distal dorsal plantar aspect of the
medial cuneiform. Mild bone marrow edema along the distal plantar
aspect of the cuboid. This may reflect reactive marrow changes
secondary to arthritis versus early osteomyelitis.

## 2021-02-27 MED ORDER — GADOBUTROL 1 MMOL/ML IV SOLN
10.0000 mL | Freq: Once | INTRAVENOUS | Status: AC | PRN
  Administered 2021-02-27: 10 mL via INTRAVENOUS

## 2021-02-27 NOTE — Telephone Encounter (Signed)
Call placed to patient to inquire about the problems with her wheelchair.  She explained that that brakes are not working correctly and she needs to keep having them tightened  She is not able to bear weight on her RLE and is in the wheelchair all of the time she is out of bed. Explained to her that this CM will try to obtain funding to get her a new wheelchair.  Email sent to [place] requesting approval for purchasing a new wheelchair for the patient

## 2021-03-02 ENCOUNTER — Ambulatory Visit (INDEPENDENT_AMBULATORY_CARE_PROVIDER_SITE_OTHER): Payer: Medicaid Other | Admitting: Infectious Diseases

## 2021-03-02 ENCOUNTER — Other Ambulatory Visit: Payer: Self-pay

## 2021-03-02 DIAGNOSIS — Z7984 Long term (current) use of oral hypoglycemic drugs: Secondary | ICD-10-CM

## 2021-03-02 DIAGNOSIS — E1165 Type 2 diabetes mellitus with hyperglycemia: Secondary | ICD-10-CM | POA: Diagnosis not present

## 2021-03-02 DIAGNOSIS — T8131XD Disruption of external operation (surgical) wound, not elsewhere classified, subsequent encounter: Secondary | ICD-10-CM | POA: Diagnosis not present

## 2021-03-02 DIAGNOSIS — Z794 Long term (current) use of insulin: Secondary | ICD-10-CM

## 2021-03-02 DIAGNOSIS — M869 Osteomyelitis, unspecified: Secondary | ICD-10-CM

## 2021-03-02 DIAGNOSIS — E1169 Type 2 diabetes mellitus with other specified complication: Secondary | ICD-10-CM | POA: Diagnosis not present

## 2021-03-02 DIAGNOSIS — M86171 Other acute osteomyelitis, right ankle and foot: Secondary | ICD-10-CM

## 2021-03-02 DIAGNOSIS — Z5181 Encounter for therapeutic drug level monitoring: Secondary | ICD-10-CM

## 2021-03-02 MED ORDER — AMOXICILLIN-POT CLAVULANATE 875-125 MG PO TABS
1.0000 | ORAL_TABLET | Freq: Two times a day (BID) | ORAL | 0 refills | Status: DC
  Filled 2021-03-02: qty 30, 15d supply, fill #0

## 2021-03-02 NOTE — Progress Notes (Incomplete)
Virtual Visit via Telephone Note  I connected withNAME@ on 03/02/21 at  3:45 PM EST by a telephone enabled telemedicine application and verified that I am speaking with the correct person using two identifiers.  Location: Patient: Home  Provider: [facility]   I discussed the limitations of evaluation and management by telemedicine and the availability of in person appointments. The patient expressed understanding and agreed to proceed.  [facility]  Patient Active Problem List   Diagnosis Date Noted   Wound dehiscence, surgical 02/02/2021   Obesity 02/02/2021   Wound dehiscence 01/24/2021   Medication monitoring encounter 01/02/2021   Diabetic ulcer of right foot associated with diabetes mellitus due to underlying condition ([place]) 01/02/2021   Osteomyelitis of right foot ([place])    Osteomyelitis of second toe of right foot ([place]) 12/16/2020   Type 2 diabetes mellitus with hyperglycemia ([place]) 12/16/2020   Class 3 obesity ([place]) 12/16/2020   Hyperlipidemia 12/16/2020   Soft tissue mass 09/15/2015    Patient's Medications  New Prescriptions   No medications on file  Previous Medications   ACETAMINOPHEN (TYLENOL) 325 MG TABLET    Take 2 tablets (650 mg total) by mouth every 6 (six) hours as needed for mild pain (or Fever >/= 101).   AMOXICILLIN-CLAVULANATE (AUGMENTIN) 875-125 MG TABLET    Take 1 tablet by mouth 2 (two) times daily.   APIXABAN (ELIQUIS) 5 MG TABS TABLET    Take  one-50m tablet twice daily.   BLOOD GLUCOSE MONITORING SUPPL (TRUE METRIX METER) W/DEVICE KIT    Use to check blood sugar twice a day.   GABAPENTIN (NEURONTIN) 300 MG CAPSULE    Take 2 capsules by mouth 3 times daily.   GLUCOSE BLOOD (TRUE METRIX BLOOD GLUCOSE TEST) TEST STRIP    Use to check blood sugar twice a day.   INSULIN GLARGINE (BASAGLAR KWIKPEN) 100 UNIT/ML    Inject 60 Units into the skin daily.   INSULIN PEN NEEDLE 32G X 4 MM MISC    use as directed   LISINOPRIL (ZESTRIL) 2.5 MG  TABLET    Take 1 tablet by mouth daily   METFORMIN (GLUCOPHAGE-XR) 500 MG 24 HR TABLET    Take 2 tablets (1,000 mg total) by mouth 2 (two) times daily.   ONDANSETRON (ZOFRAN) 4 MG TABLET    Take 1 tablet (4 mg total) by mouth every 6 (six) hours as needed for nausea.   OXYCODONE (OXY IR/ROXICODONE) 5 MG IMMEDIATE RELEASE TABLET    Take 1 tablet (5 mg total) by mouth every 3 (three) hours as needed for moderate pain.   TRUEPLUS LANCETS 28G MISC    Use to check blood sugar twice a day.  Modified Medications   No medications on file  Discontinued Medications   No medications on file    History of Present Illness: Here for follow up for RT TMA wound dehiscence and osteomyelitis. She has ben following with wound care. She has a MRI done by wound care 2/15 with findings:   1. Large soft tissue wound overlying the transmetatarsal amputation with surrounding cellulitis. Bone marrow edema within the metatarsal stumps concerning for residual osteomyelitis. Complex fluid collection along the plantar medial aspect of the foot at the level of the TMT joints measuring 4.5 x 1 x 2.8 cm with thick peripheral enhancement and small amount air within the collection with a sinus tract extending to the skin surface consistent with an abscess. 2. Mild marrow edema along the distal dorsal plantar  aspect of the medial cuneiform. Mild bone marrow edema along the distal plantar aspect of the cuboid. This may reflect reactive marrow changes secondary to arthritis versus early osteomyelitis.   MRI findings has already been discussed by Dr Heber [place] with the patient and she is supposed to see Orthopedics next week. She has been taking Augmentin twice a day without any issues. Denies any fevers, chills. Denies nausea, vomiting and diarrhea. Usually has clear red soaking in the bandage and thick yellow drainage at wound care center when pressed. Emphasized on follow up with Ortho as she may need further surgical  intervention for source control.  ROS all systems reviewed are reviewed and negative except stated as above  Past Medical History:  Diagnosis Date   Class 3 obesity ([place]) 12/16/2020   Diabetes mellitus    Past Surgical History:  Procedure Laterality Date   EXTREMITY CYST EXCISION     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   Procedure: TRANSMETATARSAL AMPUTATION;  Surgeon: Armond Hang, MD;  Location: [facility];  Service: Orthopedics;  Laterality: Right;    Social History   Tobacco Use   Smoking status: Never  Vaping Use   Vaping Use: Never used  Substance Use Topics   Alcohol use: Yes    Comment: occ   Drug use: Yes    Types: Marijuana    Family History  Problem Relation Age of Onset   Hypertension Mother    Diabetes Mother    Diabetes Other     Allergies  Allergen Reactions   Mangifera Indica Rash    FRESH MANGO  Pt states she is not allergic to mango    Health Maintenance  Topic Date Due   COVID-19 Vaccine (1) Never done   FOOT EXAM  Never done   OPHTHALMOLOGY EXAM  Never done   Hepatitis C Screening  Never done   PAP SMEAR-Modifier  Never done   INFLUENZA VACCINE  04/13/2021 (Originally 08/14/2020)   TETANUS/TDAP  01/17/2022 (Originally 06/06/2000)   HEMOGLOBIN A1C  06/16/2021   HIV Screening  Completed   HPV VACCINES  Aged Out    Observations/Objective:   Assessment and Plan: Wound dehiscence at the RT TMA site/Osteomyelitis with associated abscess 4.5*1*2.8cm( 1/19 cx Prevotella bivia) Medication Monitoring Uncontrolled DM-A1c 13.5, follows with PCP Obesity    Continue 2 more weeks of Augmentin while waiting for ortho eval  She will follow up with Ortho next week. Discussed about signs and symptoms concerning for sepsis and to go to the hospital  Labs from 2/2 reviewed  Fu in 2 weeks   Follow Up Instructions: ***   I discussed the assessment and treatment plan with the patient. The patient was provided an opportunity to ask questions  and all were answered. The patient agreed with the plan and demonstrated an understanding of the instructions.   The patient was advised to call back or seek an in-person evaluation if the symptoms worsen or if the condition fails to improve as anticipated.  I provided *** minutes of non-face-to-face time during this encounter.  Wilber Oliphant, [facility] Phone [phone] Fax no. [phone]  03/02/2021, 2:58 PM

## 2021-03-05 ENCOUNTER — Other Ambulatory Visit: Payer: Self-pay

## 2021-03-05 ENCOUNTER — Encounter (HOSPITAL_BASED_OUTPATIENT_CLINIC_OR_DEPARTMENT_OTHER): Payer: Medicaid Other | Admitting: Internal Medicine

## 2021-03-05 DIAGNOSIS — L97514 Non-pressure chronic ulcer of other part of right foot with necrosis of bone: Secondary | ICD-10-CM | POA: Diagnosis not present

## 2021-03-05 DIAGNOSIS — E11621 Type 2 diabetes mellitus with foot ulcer: Secondary | ICD-10-CM | POA: Diagnosis not present

## 2021-03-05 DIAGNOSIS — M869 Osteomyelitis, unspecified: Secondary | ICD-10-CM | POA: Diagnosis not present

## 2021-03-05 NOTE — Progress Notes (Signed)
[name], [name] ([id number]) Visit Report for 03/05/2021 Arrival Information Details Patient Name: Date of Service: [name] MillardDA V IS, [place] [name] 03/05/2021 11:15 A M Medical Record Number: [id number] Patient Account Number: [id number] Date of Birth/Sex: Treating RN: 1980/06/11 (41 y.o. [name]) [name], [name] Primary Care Nayely Dingus: [name] PasseEdwards, Michelle Other Clinician: Referring Green Quincy: Treating Briyana Badman/Extender: Grace IsaacHoffman, Jessica Edwards, Michelle Weeks in Treatment: 6 Visit Information History Since Last Visit Added or deleted any medications: No Patient Arrived: Wheel Chair Any new allergies or adverse reactions: No Arrival Time: 11:02 Had a fall or experienced change in No Accompanied By: self activities of daily living that may affect Transfer Assistance: None risk of falls: Patient Identification Verified: Yes Signs or symptoms of abuse/neglect since last visito No Secondary Verification Process Completed: Yes Hospitalized since last visit: No Patient Requires Transmission-Based No Implantable device outside of the clinic excluding No Precautions: cellular tissue based products placed in the center Patient Has Alerts: Yes since last visit: Patient Alerts: Patient on Blood Thinner Has Dressing in Place as Prescribed: Yes PICC R Arm Pain Present Now: No ABI 12/17/20 R=1.08 L=1.13 Electronic Signature(s) Signed: 03/05/2021 5:21:26 PM By: Fonnie MuBreedlove, [name] RN Entered By: Fonnie MuBreedlove, [name] on 03/05/2021 11:03:12 -------------------------------------------------------------------------------- Clinic Level of Care Assessment Details Patient Name: Date of Service: [name], [name] 03/05/2021 11:15 A M Medical Record Number: [id number] Patient Account Number: [id number] Date of Birth/Sex: Treating RN: 1980/06/11 (41 y.o. [name]) [name], [name] KendallBobbi Primary Care Enaya Howze: [name] PasseEdwards, Michelle Other Clinician: Referring Macallister Ashmead: Treating Demerius Podolak/Extender: Grace IsaacHoffman, Jessica Edwards, Michelle Weeks in  Treatment: 6 Clinic Level of Care Assessment Items TOOL 4 Quantity Score X- 1 0 Use when only an EandM is performed on FOLLOW-UP visit ASSESSMENTS - Nursing Assessment / Reassessment X- 1 10 Reassessment of Co-morbidities (includes updates in patient status) X- 1 5 Reassessment of Adherence to Treatment Plan ASSESSMENTS - Wound and Skin A ssessment / Reassessment X - Simple Wound Assessment / Reassessment - one wound 1 5 []  - 0 Complex Wound Assessment / Reassessment - multiple wounds X- 1 10 Dermatologic / Skin Assessment (not related to wound area) ASSESSMENTS - Focused Assessment X- 1 5 Circumferential Edema Measurements - multi extremities X- 1 10 Nutritional Assessment / Counseling / Intervention []  - 0 Lower Extremity Assessment (monofilament, tuning fork, pulses) []  - 0 Peripheral Arterial Disease Assessment (using hand held doppler) ASSESSMENTS - Ostomy and/or Continence Assessment and Care []  - 0 Incontinence Assessment and Management []  - 0 Ostomy Care Assessment and Management (repouching, etc.) PROCESS - Coordination of Care X - Simple Patient / Family Education for ongoing care 1 15 []  - 0 Complex (extensive) Patient / Family Education for ongoing care X- 1 10 Staff obtains ChiropractorConsents, Records, T Results / Process Orders est []  - 0 Staff telephones HHA, Nursing Homes / Clarify orders / etc []  - 0 Routine Transfer to another Facility (non-emergent condition) []  - 0 Routine Hospital Admission (non-emergent condition) []  - 0 New Admissions / Manufacturing engineernsurance Authorizations / Ordering NPWT Apligraf, etc. , []  - 0 Emergency Hospital Admission (emergent condition) X- 1 10 Simple Discharge Coordination []  - 0 Complex (extensive) Discharge Coordination PROCESS - Special Needs []  - 0 Pediatric / Minor Patient Management []  - 0 Isolation Patient Management []  - 0 Hearing / Language / Visual special needs []  - 0 Assessment of Community assistance (transportation,  D/C planning, etc.) []  - 0 Additional assistance / Altered mentation []  - 0 Support Surface(s) Assessment (bed, cushion, seat, etc.) INTERVENTIONS - Wound Cleansing / Measurement  X - Simple Wound Cleansing - one wound 1 5 []  - 0 Complex Wound Cleansing - multiple wounds X- 1 5 Wound Imaging (photographs - any number of wounds) []  - 0 Wound Tracing (instead of photographs) X- 1 5 Simple Wound Measurement - one wound []  - 0 Complex Wound Measurement - multiple wounds INTERVENTIONS - Wound Dressings []  - 0 Small Wound Dressing one or multiple wounds X- 1 15 Medium Wound Dressing one or multiple wounds []  - 0 Large Wound Dressing one or multiple wounds []  - 0 Application of Medications - topical []  - 0 Application of Medications - injection INTERVENTIONS - Miscellaneous []  - 0 External ear exam []  - 0 Specimen Collection (cultures, biopsies, blood, body fluids, etc.) []  - 0 Specimen(s) / Culture(s) sent or taken to Lab for analysis []  - 0 Patient Transfer (multiple staff / / Similar devices) []  - 0 Simple Staple / Suture removal (25 or less) []  - 0 Complex Staple / Suture removal (26 or more) []  - 0 Hypo / Hyperglycemic Management (close monitor of Blood Glucose) []  - 0 Ankle / Brachial Index (ABI) - do not check if billed separately X- 1 5 Vital Signs Has the patient been seen at the hospital within the last three years: Yes Total Score: 115 Level Of Care: New/Established - Level 3 Electronic Signature(s) Signed: 03/05/2021 5:33:59 PM By: RN, BSN Entered By: on 03/05/2021 11:24:54 -------------------------------------------------------------------------------- Encounter Discharge Information Details Patient Name: Date of Service: [name], [name] 03/05/2021 11:15 A M Medical Record Number: Patient Account Number: Date of Birth/Sex: Treating RN: Nov 24, 1980 (41 y.o. Primary Care  Linnea Todisco: Nurse, adult Other Clinician: Referring Briasia Flinders: Treating Daud Cayer/Extender: in Treatment: 6 Encounter Discharge Information Items Discharge Condition: Stable Ambulatory Status: Wheelchair Discharge Destination: Home Transportation: Private Auto Accompanied By: mother Schedule Follow-up Appointment: Yes Clinical Summary of Care: Electronic Signature(s) Signed: 03/05/2021 5:33:59 PM By: RN, BSN Entered By: on 03/05/2021 11:25:33 -------------------------------------------------------------------------------- Lower Extremity Assessment Details Patient Name: Date of Service: [name], [name] 03/05/2021 11:15 A M Medical Record Number: Shawn Stall Patient Account Number: 03/07/2021 Date of Birth/Sex: Treating RN: [birth year] (41 y.o. [id number], [name] Primary Care Shani Fitch: 06/08/1980 Other Clinician: Referring Hakan Nudelman: Treating Tabia Landowski/Extender: 24 Weeks in Treatment: 6 Edema Assessment Assessed: [Left: No] [Right: Yes] Edema: [Left: N] [Right: o] Calf Left: Right: Point of Measurement: 34 cm From Medial Instep 45 cm Ankle Left: Right: Point of Measurement: 8 cm From Medial Instep 27.5 cm Vascular Assessment Pulses: Dorsalis Pedis Palpable: [Right:Yes] Posterior Tibial Palpable: [Right:Yes] Electronic Signature(s) Signed: 03/05/2021 5:21:26 PM By: [name] Passe RN Entered By: Grace Isaac on 03/05/2021 11:11:00 -------------------------------------------------------------------------------- Multi Wound Chart Details Patient Name: Date of Service: [name], [name] 03/05/2021 11:15 A M Medical Record Number: Shawn Stall Patient Account Number: 03/07/2021 Date of Birth/Sex: Treating RN: 1980/12/21 (41 y.o. F) Primary Care Misaki Sozio: [id number] Other Clinician: Referring Stellah Donovan: Treating Sultana Tierney/Extender: 06/08/1980 in Treatment: 6 Vital Signs Height(in): 69 Capillary Blood Glucose(mg/dl): 24 Weight(lbs): Pulse(bpm): 91 Body Mass Index(BMI): Blood Pressure(mmHg): 114/68 Temperature(F): 98.3 Respiratory Rate(breaths/min): 17 Photos: [N/A:N/A] Right Amputation Site - N/A N/A Wound Location: Transmetatarsal Surgical Injury N/A N/A Wounding Event: Diabetic Wound/Ulcer of the Lower N/A N/A Primary Etiology: Extremity Hypertension, Type II Diabetes, N/A N/A Comorbid History: Osteomyelitis, Neuropathy 12/15/2020 N/A N/A Date Acquired: 6 N/A N/A  Weeks of Treatment: Open N/A N/A Wound Status: No N/A N/A Wound Recurrence: 2.5x7.5x2.7 N/A N/A Measurements L x W x D (cm) 14.726 N/A N/A A (cm) : rea 39.761 N/A N/A Volume (cm) : 65.40% N/A N/A % Reduction in A rea: 41.60% N/A N/A % Reduction in Volume: 5 Position 1 (o'clock): 3.4 Maximum Distance 1 (cm): Yes N/A N/A Tunneling: Grade 3 N/A N/A Classification: Medium N/A N/A Exudate A mount: Purulent N/A N/A Exudate Type: yellow, brown, green N/A N/A Exudate Color: Thickened N/A N/A Wound Margin: Small (1-33%) N/A N/A Granulation A mount: Red, Pink N/A N/A Granulation Quality: Large (67-100%) N/A N/A Necrotic A mount: Fat Layer (Subcutaneous Tissue): Yes N/A N/A Exposed Structures: Fascia: No Tendon: No Muscle: No Joint: No Bone: No None N/A N/A Epithelialization: Treatment Notes Wound #1 (Amputation Site - Transmetatarsal) Wound Laterality: Right Cleanser Normal Saline Discharge Instruction: Cleanse the wound with Normal Saline prior to applying a clean dressing using gauze sponges, not tissue or cotton balls. Peri-Wound Care Topical Dakin's solution Discharge Instruction: Moisten Gauze with Dakins Primary Dressing Secondary Dressing Woven Gauze Sponge, Non-Sterile 4x4 in Discharge Instruction: Apply over primary dressing as directed. ABD Pad, 5x9 Discharge Instruction:  Apply over primary dressing as directed. Secured With [employer], 4.5x3.1 (in/yd) Discharge Instruction: Secure with Kerlix as directed. Transpore Surgical Tape, 2x10 (in/yd) Discharge Instruction: Secure dressing with tape as directed. Compression Wrap Compression Stockings Add-Ons Electronic Signature(s) Signed: 03/05/2021 11:45:01 AM By: Geralyn Corwin DO Entered By: Geralyn Corwin on 03/05/2021 11:38:05 -------------------------------------------------------------------------------- Multi-Disciplinary Care Plan Details Patient Name: Date of Service: [name], [name] 03/05/2021 11:15 A M Medical Record Number: [id number] Patient Account Number: [id number] Date of Birth/Sex: Treating RN: Jan 09, 1981 (41 y.o. [name] Pickett, [name] Kendall Primary Care Leyda Vanderwerf: [name] Other Clinician: Referring Jsaon Yoo: Treating Sergio Zawislak/Extender: Grace Isaac in Treatment: 6 Active Inactive Nutrition Nursing Diagnoses: Impaired glucose control: actual or potential Goals: Patient/caregiver verbalizes understanding of need to maintain therapeutic glucose control per primary care physician Date Initiated: 01/22/2021 Target Resolution Date: 04/13/2021 Goal Status: Active Interventions: Assess HgA1c results as ordered upon admission and as needed Provide education on elevated blood sugars and impact on wound healing Treatment Activities: Obtain HgA1c : 01/22/2021 Notes: Wound/Skin Impairment Nursing Diagnoses: Impaired tissue integrity Goals: Patient/caregiver will verbalize understanding of skin care regimen Date Initiated: 01/22/2021 Target Resolution Date: 04/13/2021 Goal Status: Active Ulcer/skin breakdown will have a volume reduction of 30% by week 4 Date Initiated: 01/22/2021 Target Resolution Date: 03/16/2021 Goal Status: Active Interventions: Assess patient/caregiver ability to obtain necessary supplies Assess patient/caregiver ability to perform  ulcer/skin care regimen upon admission and as needed Assess ulceration(s) every visit Provide education on ulcer and skin care Treatment Activities: Topical wound management initiated : 01/22/2021 Notes: Electronic Signature(s) Signed: 03/05/2021 5:33:59 PM By: Shawn Stall RN, BSN Entered By: Shawn Stall on 03/05/2021 11:20:23 -------------------------------------------------------------------------------- Pain Assessment Details Patient Name: Date of Service: [name], [name] 03/05/2021 11:15 A M Medical Record Number: [id number] Patient Account Number: [id number] Date of Birth/Sex: Treating RN: 1980/08/24 (40 y.o. [name] Rowan, [name] Primary Care Sinead Hockman: [name] Other Clinician: Referring Kwynn Schlotter: Treating Marquel Pottenger/Extender: Grace Isaac in Treatment: 6 Active Problems Location of Pain Severity and Description of Pain Patient Has Paino No Site Locations Pain Management and Medication Current Pain Management: Electronic Signature(s) Signed: 03/05/2021 5:21:26 PM By: Fonnie Mu RN Entered By: Fonnie Mu on 03/05/2021 11:09:17 -------------------------------------------------------------------------------- Patient/Caregiver Education Details Patient Name: Date of Service: DA V  IS, [name] 2/20/2023andnbsp11:15 A M Medical Record Number: [id number] Patient Account Number: [id number] Date of Birth/Gender: Treating RN: 21-Mar-1980 (41 y.o. [name] Primary Care Physician: [name] Other Clinician: Referring Physician: Treating Physician/Extender: Grace Isaac in Treatment: 6 Education Assessment Education Provided To: Patient Education Topics Provided Wound/Skin Impairment: Handouts: Skin Care Do's and Dont's Methods: Explain/Verbal Responses: Reinforcements needed Electronic Signature(s) Signed: 03/05/2021 5:33:59 PM By: Shawn Stall RN, BSN Entered By: Shawn Stall on 03/05/2021 11:20:47 -------------------------------------------------------------------------------- Wound Assessment Details Patient Name: Date of Service: [name], [name] 03/05/2021 11:15 A M Medical Record Number: [id number] Patient Account Number: [id number] Date of Birth/Sex: Treating RN: Dec 20, 1980 (41 y.o. 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 Wound Status: Open Wounding Event: Surgical Injury Comorbid Hypertension, Type II Diabetes, Osteomyelitis, History: Neuropathy Date Acquired: 12/15/2020 Weeks Of Treatment: 6 Clustered Wound: No Photos Wound Measurements Length: (cm) 2.5 Width: (cm) 7.5 Depth: (cm) 2.7 Area: (cm) 14.726 Volume: (cm) 39.761 % Reduction in Area: 65.4% % Reduction in Volume: 41.6% Epithelialization: None Tunneling: Yes Position (o'clock): 5 Maximum Distance: (cm) 3.4 Undermining: No Wound Description Classification: Grade 3 Wound Margin: Thickened Exudate Amount: Medium Exudate Type: Purulent Exudate Color: yellow, brown, green Foul Odor After Cleansing: No Slough/Fibrino Yes Wound Bed Granulation Amount: Small (1-33%) Exposed Structure Granulation Quality: Red, Pink Fascia Exposed: No Necrotic Amount: Large (67-100%) Fat Layer (Subcutaneous Tissue) Exposed: Yes Necrotic Quality: Adherent Slough Tendon Exposed: No Muscle Exposed: No Joint Exposed: No Bone Exposed: No Treatment Notes Wound #1 (Amputation Site - Transmetatarsal) Wound Laterality: Right Cleanser Normal Saline Discharge Instruction: Cleanse the wound with Normal Saline prior to applying a clean dressing using gauze sponges, not tissue or cotton balls. Peri-Wound  Care Topical Dakin's solution Discharge Instruction: Moisten Gauze with Dakins Primary Dressing Secondary Dressing Woven Gauze Sponge, Non-Sterile 4x4 in Discharge Instruction: Apply over primary dressing as directed. ABD Pad, 5x9 Discharge Instruction: Apply over primary dressing as directed. Secured With [employer], 4.5x3.1 (in/yd) Discharge Instruction: Secure with Kerlix as directed. Transpore Surgical Tape, 2x10 (in/yd) Discharge Instruction: Secure dressing with tape as directed. Compression Wrap Compression Stockings Add-Ons Electronic Signature(s) Signed: 03/05/2021 5:33:59 PM By: Shawn Stall RN, BSN Entered By: Shawn Stall on 03/05/2021 11:16:40 -------------------------------------------------------------------------------- Vitals Details Patient Name: Date of Service: [name], [name] 03/05/2021 11:15 A M Medical Record Number: [id number] Patient Account Number: [id number] Date of Birth/Sex: Treating RN: [birth year] (41 y.o. [name] Rowan, [name] Primary Care Juriel Cid: [name] Other Clinician: Referring Telly Jawad: Treating Rozalyn Osland/Extender: Grace Isaac in Treatment: 6 Vital Signs Time Taken: 11:03 Temperature (F): 98.3 Height (in): 69 Pulse (bpm): 91 Respiratory Rate (breaths/min): 17 Blood Pressure (mmHg): 114/68 Capillary Blood Glucose (mg/dl): 127 Reference Range: 80 - 120 mg / dl Electronic Signature(s) Signed: 03/05/2021 5:21:26 PM By: Fonnie Mu RN Entered By: Fonnie Mu on 03/05/2021 11:11:24

## 2021-03-05 NOTE — Progress Notes (Signed)
Helman, Lorenna L. ([id number]) Visit Report for 03/05/2021 Chief Complaint Document Details Patient Name: Date of Service: [name] MillardDA V [name], [place] [name] 03/05/2021 11:15 A M Medical Record Number: [id number] Patient Account Number: [id number] Date of Birth/Sex: Treating RN: 08-14-1980 (41 y.o. F) Primary Care Provider: Gwinda PasseEdwards, Michelle Other Clinician: Referring Provider: Treating Provider/Extender: Grace IsaacHoffman, Alveda Vanhorne Edwards, Michelle Weeks in Treatment: 6 Information Obtained from: Patient Chief Complaint Osteomyelitis of the right foot status post transmetatarsal amputation with surgical site dehiscence Electronic Signature(s) Signed: 03/05/2021 11:45:01 AM By: Geralyn CorwinHoffman, Jadarrius Maselli DO Entered By: Geralyn CorwinHoffman, Aalijah Mims on 03/05/2021 11:38:21 -------------------------------------------------------------------------------- HPI Details Patient Name: Date of Service: [name], [name] [name] 03/05/2021 11:15 A M Medical Record Number: [id number] Patient Account Number: [id number] Date of Birth/Sex: Treating RN: 08-14-1980 (41 y.o. F) Primary Care Provider: Gwinda PasseEdwards, Michelle Other Clinician: Referring Provider: Treating Provider/Extender: Grace IsaacHoffman, Rowene Suto Edwards, Michelle Weeks in Treatment: 6 History of Present Illness HPI Description: Admission 01/22/2021 [name] [name] a 41 year old female with a past medical history of insulin-dependent uncontrolled type 2 diabetes with last hemoglobin A1c of 13.5, osteomyelitis of the right foot status post transmetatarsal amputation on 12/18/2020 that presents to the clinic for right foot wound. She has had dehiscence of the surgical site. [name] using wet-to-dry dressings. She has a PICC line and receiving IV ceftriaxone daily for her osteomyelitis. There [name] an end date of 01/27/2021. [name] also taking oral metronidazole. She currently denies systemic signs of infection. 1/19; patient presents for follow-up. She was diagnosed with a DVT to the right lower  extremity 2 days ago. [name] on Eliquis now. [name] scheduled to see her infectious disease doctor tomorrow. She has been using Dakin's wet-to-dry dressings. She denies systemic signs of infection. 1/26; patient presents for follow-up. She saw infectious disease on 1/21 started on Augmentin. Her PICC line and IV ceftriaxone was discontinued. Patient reports stability to her wound. She has been using Dakin's wet-to-dry dressings. She currently denies systemic signs of infection. 2/3; patient presents for follow-up. She continues to use Dakin's wet-to-dry dressings to the wound bed. She saw Dr. Manson PasseyMandahar with infectious disease yesterday and [name] continuing Augmentin. T entative end date [name] 2/16. Patient reports following up with orthopedics. She states there [name] no further plan from them. She currently denies systemic signs of infection. 2/10; patient presents for follow-up. She continues to use Dakin's wet to dry dressings. [name] scheduled to have her MRI done on 2/14. She states that she had pain to the debridement site from last clinic visit and declines debridement today. She denies systemic signs of infection. She continues to have yellow thick drainage. 2/20; patient presents for follow-up. She continues to use Dakin's wet-to-dry dressings. She obtained her MRI. The results showed an abscess and [name] scheduled to see her orthopedic surgeon on 2/23. She saw infectious disease 2/17 and her antibiotics were extended. She currently denies systemic signs of infection. Electronic Signature(s) Signed: 03/05/2021 11:45:01 AM By: Geralyn CorwinHoffman, Tegan Burnside DO Entered By: Geralyn CorwinHoffman, Isidoro Santillana on 03/05/2021 11:39:49 -------------------------------------------------------------------------------- Physical Exam Details Patient Name: Date of Service: [name], [name] [name] 03/05/2021 11:15 A M Medical Record Number: [id number] Patient Account Number: [id number] Date of Birth/Sex: Treating RN: 08-14-1980 ([age] y.o. F) Primary  Care Provider: Gwinda PasseEdwards, Michelle Other Clinician: Referring Provider: Treating Provider/Extender: Grace IsaacHoffman, Cleve Paolillo Edwards, Michelle Weeks in Treatment: 6 Constitutional respirations regular, non-labored and within target range for patient.Marland Kitchen. Psychiatric pleasant and cooperative. Notes Right foot: Transmetatarsal surgical wound site has completely dehisced. At  the opening there [name] granulation tissue and nonviable tissue. There [name] purulent drainage on palpation. Probing to bone. Electronic Signature(s) Signed: 03/05/2021 11:45:01 AM By: Geralyn CorwinHoffman, Gari Trovato DO Entered By: Geralyn CorwinHoffman, Natthew Marlatt on 03/05/2021 11:40:49 -------------------------------------------------------------------------------- Physician Orders Details Patient Name: Date of Service: [name], [name] [name] 03/05/2021 11:15 A M Medical Record Number: [id number] Patient Account Number: [id number] Date of Birth/Sex: Treating RN: 11/27/1980 (41 y.o. [name]) [name], [name] Primary Care Provider: Gwinda PasseEdwards, Michelle Other Clinician: Referring Provider: Treating Provider/Extender: Grace IsaacHoffman, Malvern Kadlec Edwards, Michelle Weeks in Treatment: 6 Verbal / Phone Orders: No Diagnosis Coding ICD-10 Coding Code Description L97.514 Non-pressure chronic ulcer of other part of right foot with necrosis of bone E11.621 Type 2 diabetes mellitus with foot ulcer M86.9 Osteomyelitis, unspecified Follow-up Appointments ppointment in 1 week. - with Dr. Mikey BussingHoffman Room 7 Return A Other: - See [facility] 03/08/2021 Thursday follow up with Infectious Disease 03/27/2021 Bathing/ Shower/ Hygiene Other Bathing/Shower/Hygiene Orders/Instructions: - Clean with saline or Dakins Edema Control - Lymphedema / SCD / Other Elevate legs to the level of the heart or above for 30 minutes daily and/or when sitting, a frequency of: - throughout the day Avoid standing for long periods of time. Moisturize legs daily. Additional Orders / Instructions Follow Nutritious Diet -  -Monitor/Control Blood Sugar -High Protein Diet Wound Treatment Wound #1 - Amputation Site - Transmetatarsal Wound Laterality: Right Cleanser: Normal Saline 2 x Per Day/15 Days Discharge Instructions: Cleanse the wound with Normal Saline prior to applying a clean dressing using gauze sponges, not tissue or cotton balls. Topical: Dakin's solution 2 x Per Day/15 Days Discharge Instructions: Moisten Gauze with Dakins Secondary Dressing: Woven Gauze Sponge, Non-Sterile 4x4 in 2 x Per Day/15 Days Discharge Instructions: Apply over primary dressing as directed. Secondary Dressing: ABD Pad, 5x9 2 x Per Day/15 Days Discharge Instructions: Apply over primary dressing as directed. Secured With: [employer] Group[employer]erlix Roll Sterile, 4.5x3.1 (in/yd) 2 x Per Day/15 Days Discharge Instructions: Secure with Kerlix as directed. Secured With: Transpore Surgical Tape, 2x10 (in/yd) 2 x Per Day/15 Days Discharge Instructions: Secure dressing with tape as directed. Electronic Signature(s) Signed: 03/05/2021 11:45:01 AM By: Geralyn CorwinHoffman, Tomoko Sandra DO Entered By: Geralyn CorwinHoffman, Danene Montijo on 03/05/2021 11:41:08 -------------------------------------------------------------------------------- Problem List Details Patient Name: Date of Service: [name], [name] [name] 03/05/2021 11:15 A M Medical Record Number: [id number] Patient Account Number: [id number] Date of Birth/Sex: Treating RN: 11/27/1980 (41 y.o. [name]) [name], [name] Primary Care Provider: Gwinda PasseEdwards, Michelle Other Clinician: Referring Provider: Treating Provider/Extender: Grace IsaacHoffman, Lorra Freeman Edwards, Michelle Weeks in Treatment: 6 Active Problems ICD-10 Encounter Code Description Active Date MDM Diagnosis L97.514 Non-pressure chronic ulcer of other part of right foot with necrosis of bone 01/22/2021 No Yes E11.621 Type 2 diabetes mellitus with foot ulcer 01/22/2021 No Yes M86.9 Osteomyelitis, unspecified 01/22/2021 No Yes Inactive Problems Resolved Problems Electronic Signature(s) Signed:  03/05/2021 11:45:01 AM By: Geralyn CorwinHoffman, Ermal Haberer DO Entered By: Geralyn CorwinHoffman, Ennis Delpozo on 03/05/2021 11:37:53 -------------------------------------------------------------------------------- Progress Note Details Patient Name: Date of Service: [name], [name] [name] 03/05/2021 11:15 A M Medical Record Number: [id number] Patient Account Number: [id number] Date of Birth/Sex: Treating RN: 11/27/1980 (41 y.o. F) Primary Care Provider: Gwinda PasseEdwards, Michelle Other Clinician: Referring Provider: Treating Provider/Extender: Grace IsaacHoffman, Zimri Brennen Edwards, Michelle Weeks in Treatment: 6 Subjective Chief Complaint Information obtained from Patient Osteomyelitis of the right foot status post transmetatarsal amputation with surgical site dehiscence History of Present Illness (HPI) Admission 01/22/2021 Ms. [name] [name] Enck [name] a 41 year old female with a past medical history of insulin-dependent uncontrolled type 2 diabetes with last  hemoglobin A1c of 13.5, osteomyelitis of the right foot status post transmetatarsal amputation on 12/18/2020 that presents to the clinic for right foot wound. She has had dehiscence of the surgical site. [name] using wet-to-dry dressings. She has a PICC line and receiving IV ceftriaxone daily for her osteomyelitis. There [name] an end date of 01/27/2021. [name] also taking oral metronidazole. She currently denies systemic signs of infection. 1/19; patient presents for follow-up. She was diagnosed with a DVT to the right lower extremity 2 days ago. [name] on Eliquis now. [name] scheduled to see her infectious disease doctor tomorrow. She has been using Dakin's wet-to-dry dressings. She denies systemic signs of infection. 1/26; patient presents for follow-up. She saw infectious disease on 1/21 started on Augmentin. Her PICC line and IV ceftriaxone was discontinued. Patient reports stability to her wound. She has been using Dakin's wet-to-dry dressings. She currently denies systemic signs of  infection. 2/3; patient presents for follow-up. She continues to use Dakin's wet-to-dry dressings to the wound bed. She saw Dr. Manson Passey with infectious disease yesterday and [name] continuing Augmentin. T entative end date [name] 2/16. Patient reports following up with orthopedics. She states there [name] no further plan from them. She currently denies systemic signs of infection. 2/10; patient presents for follow-up. She continues to use Dakin's wet to dry dressings. [name] scheduled to have her MRI done on 2/14. She states that she had pain to the debridement site from last clinic visit and declines debridement today. She denies systemic signs of infection. She continues to have yellow thick drainage. 2/20; patient presents for follow-up. She continues to use Dakin's wet-to-dry dressings. She obtained her MRI. The results showed an abscess and [name] scheduled to see her orthopedic surgeon on 2/23. She saw infectious disease 2/17 and her antibiotics were extended. She currently denies systemic signs of infection. Patient History Information obtained from Patient. Family History Cancer - Paternal Grandparents, Diabetes - Mother, Hypertension - Mother, Stroke - Maternal Grandparents, No family history of Heart Disease, Hereditary Spherocytosis, Kidney Disease, Lung Disease, Seizures, Thyroid Problems, Tuberculosis. Social History Never smoker, Marital Status - Single, Alcohol Use - Rarely, Drug Use - Prior History - Marijuana, Caffeine Use - Daily. Medical History Cardiovascular Patient has history of Hypertension Endocrine Patient has history of Type II Diabetes Musculoskeletal Patient has history of Osteomyelitis - Right Transmet 12/18/20 Neurologic Patient has history of Neuropathy Objective Constitutional respirations regular, non-labored and within target range for patient.. Vitals Time Taken: 11:03 AM, Height: 69 in, Temperature: 98.3 F, Pulse: 91 bpm, Respiratory Rate: 17 breaths/min, Blood  Pressure: 114/68 mmHg, Capillary Blood Glucose: 164 mg/dl. Psychiatric pleasant and cooperative. General Notes: Right foot: Transmetatarsal surgical wound site has completely dehisced. At the opening there [name] granulation tissue and nonviable tissue. There [name] purulent drainage on palpation. Probing to bone. Integumentary (Hair, Skin) Wound #1 status [name] Open. Original cause of wound was Surgical Injury. The date acquired was: 12/15/2020. The wound has been in treatment 6 weeks. The wound [name] located on the Right Amputation Site - Transmetatarsal. The wound measures 2.5cm length x 7.5cm width x 2.7cm depth; 14.726cm^2 area and 39.761cm^3 volume. There [name] Fat Layer (Subcutaneous Tissue) exposed. There [name] no undermining noted, however, there [name] tunneling at 5:00 with a maximum distance of 3.4cm. There [name] a medium amount of purulent drainage noted. The wound margin [name] thickened. There [name] small (1-33%) red, pink granulation within the wound bed. There [name] a large (67-100%) amount of necrotic tissue  within the wound bed including Adherent Slough. Assessment Active Problems ICD-10 Non-pressure chronic ulcer of other part of right foot with necrosis of bone Type 2 diabetes mellitus with foot ulcer Osteomyelitis, unspecified Overall appearance of the foot and wound [name] improved however there [name] still purulent drainage on palpation. Patient had an MRI on 02/28/2021 that showed a complex fluid collection with thick peripheral enhancement and a sinus tract extending to the skin surface concerning for an abscess. Results of the MRI were relayed to the patient on 2/17 and she scheduled an appointment to see her orthopedic surgeon on 2/23. She saw infectious disease On 2/17 and they have extended her Augmentin for 2 more weeks. I recommended continuing Dakin's wet-to-dry dressing. There are no systemic signs of infection on exam. I did recommend that if her foot were to become red, warm and develop fever/chills or  increased pain that she go to the ED. Follow-up in 1 week. Plan Follow-up Appointments: Return Appointment in 1 week. - with Dr. Mikey Bussing Room 7 Other: - See [facility] 03/08/2021 Thursday follow up with Infectious Disease 03/27/2021 Bathing/ Shower/ Hygiene: Other Bathing/Shower/Hygiene Orders/Instructions: - Clean with saline or Dakins Edema Control - Lymphedema / SCD / Other: Elevate legs to the level of the heart or above for 30 minutes daily and/or when sitting, a frequency of: - throughout the day Avoid standing for long periods of time. Moisturize legs daily. Additional Orders / Instructions: Follow Nutritious Diet - -Monitor/Control Blood Sugar -High Protein Diet WOUND #1: - Amputation Site - Transmetatarsal Wound Laterality: Right Cleanser: Normal Saline 2 x Per Day/15 Days Discharge Instructions: Cleanse the wound with Normal Saline prior to applying a clean dressing using gauze sponges, not tissue or cotton balls. Topical: Dakin's solution 2 x Per Day/15 Days Discharge Instructions: Moisten Gauze with Dakins Secondary Dressing: Woven Gauze Sponge, Non-Sterile 4x4 in 2 x Per Day/15 Days Discharge Instructions: Apply over primary dressing as directed. Secondary Dressing: ABD Pad, 5x9 2 x Per Day/15 Days Discharge Instructions: Apply over primary dressing as directed. Secured With: [employer], 4.5x3.1 (in/yd) 2 x Per Day/15 Days Discharge Instructions: Secure with Kerlix as directed. Secured With: Transpore Surgical T ape, 2x10 (in/yd) 2 x Per Day/15 Days Discharge Instructions: Secure dressing with tape as directed. 1. Dakin's wet-to-dry dressings 2. Follow-up in 1 week Electronic Signature(s) Signed: 03/05/2021 11:45:01 AM By: Geralyn Corwin DO Entered By: Geralyn Corwin on 03/05/2021 11:44:09 -------------------------------------------------------------------------------- HxROS Details Patient Name: Date of Service: [name], [name] [name] 03/05/2021 11:15 A  M Medical Record Number: [id number] Patient Account Number: [id number] Date of Birth/Sex: Treating RN: 01/31/1980 (41 y.o. F) Primary Care Provider: Gwinda Passe Other Clinician: Referring Provider: Treating Provider/Extender: Grace Isaac in Treatment: 6 Information Obtained From Patient Cardiovascular Medical History: Positive for: Hypertension Endocrine Medical History: Positive for: Type II Diabetes Time with diabetes: Dx 2009 Treated with: Insulin, Oral agents Blood sugar tested every day: Yes Tested : daily Musculoskeletal Medical History: Positive for: Osteomyelitis - Right Transmet 12/18/20 Neurologic Medical History: Positive for: Neuropathy Immunizations Pneumococcal Vaccine: Received Pneumococcal Vaccination: No Implantable Devices Yes Family and Social History Cancer: Yes - Paternal Grandparents; Diabetes: Yes - Mother; Heart Disease: No; Hereditary Spherocytosis: No; Hypertension: Yes - Mother; Kidney Disease: No; Lung Disease: No; Seizures: No; Stroke: Yes - Maternal Grandparents; Thyroid Problems: No; Tuberculosis: No; Never smoker; Marital Status - Single; Alcohol Use: Rarely; Drug Use: Prior History - Marijuana; Caffeine Use: Daily; Financial Concerns: No; Food, [employer] fast streamer or Shelter  Needs: No; Support System Lacking: No; Transportation Concerns: No Electronic Signature(s) Signed: 03/05/2021 11:45:01 AM By: Geralyn Corwin DO Entered By: Geralyn Corwin on 03/05/2021 11:39:57 -------------------------------------------------------------------------------- SuperBill Details Patient Name: Date of Service: [name] [name], [name] [name] 03/05/2021 Medical Record Number: [id number] Patient Account Number: [id number] Date of Birth/Sex: Treating RN: 1981/01/05 (41 y.o. [name] Primary Care Provider: Gwinda Passe Other Clinician: Referring Provider: Treating Provider/Extender: Grace Isaac in  Treatment: 6 Diagnosis Coding ICD-10 Codes Code Description [id number] Non-pressure chronic ulcer of other part of right foot with necrosis of bone E11.621 Type 2 diabetes mellitus with foot ulcer M86.9 Osteomyelitis, unspecified Facility Procedures CPT4 Code: [id number] Description: 99213 - WOUND CARE VISIT-LEV 3 EST PT Modifier: Quantity: 1 Physician Procedures : CPT4 Code Description Modifier [id number] - WC PHYS LEVEL 3 - EST PT ICD-10 Diagnosis Description L97.514 Non-pressure chronic ulcer of other part of right foot with necrosis of bone E11.621 Type 2 diabetes mellitus with foot ulcer M86.9  Osteomyelitis, unspecified Quantity: 1 Electronic Signature(s) Signed: 03/05/2021 11:45:01 AM By: Geralyn Corwin DO Entered By: Geralyn Corwin on 03/05/2021 11:44:22

## 2021-03-07 ENCOUNTER — Other Ambulatory Visit: Payer: Self-pay

## 2021-03-08 ENCOUNTER — Other Ambulatory Visit: Payer: Self-pay

## 2021-03-08 ENCOUNTER — Emergency Department (HOSPITAL_COMMUNITY)
Admission: EM | Admit: 2021-03-08 | Discharge: 2021-03-08 | Disposition: A | Discharge status: Home / Self Care | Payer: Medicaid Other | Source: Home / Self Care

## 2021-03-08 ENCOUNTER — Emergency Department (HOSPITAL_COMMUNITY): Payer: Self-pay

## 2021-03-08 ENCOUNTER — Inpatient Hospital Stay (HOSPITAL_COMMUNITY)
Admission: EM | Admit: 2021-03-08 | Discharge: 2021-03-14 | DRG: 982 | Disposition: A | Discharge status: Home Health | Payer: Medicaid Other | Attending: Internal Medicine | Admitting: Internal Medicine

## 2021-03-08 ENCOUNTER — Encounter (HOSPITAL_COMMUNITY): Payer: Self-pay | Admitting: *Deleted

## 2021-03-08 DIAGNOSIS — M869 Osteomyelitis, unspecified: Secondary | ICD-10-CM | POA: Diagnosis not present

## 2021-03-08 DIAGNOSIS — T8130XA Disruption of wound, unspecified, initial encounter: Secondary | ICD-10-CM | POA: Diagnosis present

## 2021-03-08 DIAGNOSIS — L02611 Cutaneous abscess of right foot: Secondary | ICD-10-CM | POA: Diagnosis present

## 2021-03-08 DIAGNOSIS — I82501 Chronic embolism and thrombosis of unspecified deep veins of right lower extremity: Secondary | ICD-10-CM | POA: Diagnosis not present

## 2021-03-08 DIAGNOSIS — Z8249 Family history of ischemic heart disease and other diseases of the circulatory system: Secondary | ICD-10-CM

## 2021-03-08 DIAGNOSIS — Z89421 Acquired absence of other right toe(s): Secondary | ICD-10-CM

## 2021-03-08 DIAGNOSIS — E785 Hyperlipidemia, unspecified: Secondary | ICD-10-CM | POA: Diagnosis present

## 2021-03-08 DIAGNOSIS — Z6841 Body Mass Index (BMI) 40.0 and over, adult: Secondary | ICD-10-CM

## 2021-03-08 DIAGNOSIS — Z86718 Personal history of other venous thrombosis and embolism: Secondary | ICD-10-CM

## 2021-03-08 DIAGNOSIS — L089 Local infection of the skin and subcutaneous tissue, unspecified: Secondary | ICD-10-CM

## 2021-03-08 DIAGNOSIS — E871 Hypo-osmolality and hyponatremia: Secondary | ICD-10-CM | POA: Diagnosis present

## 2021-03-08 DIAGNOSIS — L03115 Cellulitis of right lower limb: Secondary | ICD-10-CM | POA: Diagnosis present

## 2021-03-08 DIAGNOSIS — M868X7 Other osteomyelitis, ankle and foot: Secondary | ICD-10-CM | POA: Diagnosis present

## 2021-03-08 DIAGNOSIS — I825Z1 Chronic embolism and thrombosis of unspecified deep veins of right distal lower extremity: Secondary | ICD-10-CM

## 2021-03-08 DIAGNOSIS — E1152 Type 2 diabetes mellitus with diabetic peripheral angiopathy with gangrene: Principal | ICD-10-CM | POA: Diagnosis present

## 2021-03-08 DIAGNOSIS — E1169 Type 2 diabetes mellitus with other specified complication: Secondary | ICD-10-CM | POA: Diagnosis present

## 2021-03-08 DIAGNOSIS — I82401 Acute embolism and thrombosis of unspecified deep veins of right lower extremity: Secondary | ICD-10-CM

## 2021-03-08 DIAGNOSIS — Z833 Family history of diabetes mellitus: Secondary | ICD-10-CM

## 2021-03-08 DIAGNOSIS — Y838 Other surgical procedures as the cause of abnormal reaction of the patient, or of later complication, without mention of misadventure at the time of the procedure: Secondary | ICD-10-CM | POA: Diagnosis present

## 2021-03-08 DIAGNOSIS — Z7901 Long term (current) use of anticoagulants: Secondary | ICD-10-CM

## 2021-03-08 DIAGNOSIS — E1165 Type 2 diabetes mellitus with hyperglycemia: Secondary | ICD-10-CM | POA: Diagnosis present

## 2021-03-08 DIAGNOSIS — T8781 Dehiscence of amputation stump: Secondary | ICD-10-CM | POA: Diagnosis present

## 2021-03-08 DIAGNOSIS — I10 Essential (primary) hypertension: Secondary | ICD-10-CM | POA: Diagnosis present

## 2021-03-08 DIAGNOSIS — Z87891 Personal history of nicotine dependence: Secondary | ICD-10-CM

## 2021-03-08 DIAGNOSIS — Z7984 Long term (current) use of oral hypoglycemic drugs: Secondary | ICD-10-CM

## 2021-03-08 DIAGNOSIS — Z20822 Contact with and (suspected) exposure to covid-19: Secondary | ICD-10-CM | POA: Diagnosis present

## 2021-03-08 DIAGNOSIS — Z794 Long term (current) use of insulin: Secondary | ICD-10-CM

## 2021-03-08 DIAGNOSIS — Z79899 Other long term (current) drug therapy: Secondary | ICD-10-CM

## 2021-03-08 LAB — CBC WITH DIFFERENTIAL/PLATELET
Abs Immature Granulocytes: 0.01 10*3/uL (ref 0.00–0.07)
Basophils Absolute: 0 10*3/uL (ref 0.0–0.1)
Basophils Relative: 0 %
Eosinophils Absolute: 0.2 10*3/uL (ref 0.0–0.5)
Eosinophils Relative: 2 %
HCT: 41 % (ref 36.0–46.0)
Hemoglobin: 12.9 g/dL (ref 12.0–15.0)
Immature Granulocytes: 0 %
Lymphocytes Relative: 25 %
Lymphs Abs: 2.1 10*3/uL (ref 0.7–4.0)
MCH: 26 pg (ref 26.0–34.0)
MCHC: 31.5 g/dL (ref 30.0–36.0)
MCV: 82.5 fL (ref 80.0–100.0)
Monocytes Absolute: 0.6 10*3/uL (ref 0.1–1.0)
Monocytes Relative: 7 %
Neutro Abs: 5.4 10*3/uL (ref 1.7–7.7)
Neutrophils Relative %: 66 %
Platelets: 283 10*3/uL (ref 150–400)
RBC: 4.97 MIL/uL (ref 3.87–5.11)
RDW: 14.9 % (ref 11.5–15.5)
WBC: 8.3 10*3/uL (ref 4.0–10.5)
nRBC: 0 % (ref 0.0–0.2)

## 2021-03-08 LAB — COMPREHENSIVE METABOLIC PANEL
ALT: 14 U/L (ref 0–44)
AST: 17 U/L (ref 15–41)
Albumin: 3.6 g/dL (ref 3.5–5.0)
Alkaline Phosphatase: 69 U/L (ref 38–126)
Anion gap: 9 (ref 5–15)
BUN: 15 mg/dL (ref 6–20)
CO2: 24 mmol/L (ref 22–32)
Calcium: 9.5 mg/dL (ref 8.9–10.3)
Chloride: 102 mmol/L (ref 98–111)
Creatinine, Ser: 0.74 mg/dL (ref 0.44–1.00)
GFR, Estimated: >60 mL/min (ref >60)
Glucose, Bld: 176 mg/dL — ABNORMAL HIGH (ref 70–99)
Potassium: 4.4 mmol/L (ref 3.5–5.1)
Sodium: 135 mmol/L (ref 135–145)
Total Bilirubin: 0.5 mg/dL (ref 0.3–1.2)
Total Protein: 7.8 g/dL (ref 6.5–8.1)

## 2021-03-08 LAB — HEPARIN LEVEL (UNFRACTIONATED): Heparin Unfractionated: <0.1 IU/mL — ABNORMAL LOW (ref 0.30–0.70)

## 2021-03-08 LAB — APTT: aPTT: 28 seconds (ref 24–36)

## 2021-03-08 LAB — GLUCOSE, CAPILLARY
Glucose-Capillary: 245 mg/dL — ABNORMAL HIGH (ref 70–99)
Glucose-Capillary: 169 mg/dL — ABNORMAL HIGH (ref 70–99)

## 2021-03-08 LAB — CBG MONITORING, ED: Glucose-Capillary: 184 mg/dL — ABNORMAL HIGH (ref 70–99)

## 2021-03-08 LAB — RESP PANEL BY RT-PCR (FLU A&B, COVID) ARPGX2
Influenza A by PCR: NEGATIVE
Influenza B by PCR: NEGATIVE
SARS Coronavirus 2 by RT PCR: NEGATIVE

## 2021-03-08 LAB — PROTIME-INR
INR: 1 (ref 0.8–1.2)
Prothrombin Time: 13.2 seconds (ref 11.4–15.2)

## 2021-03-08 IMAGING — DX DG FOOT COMPLETE 3+V*R*
3 series · 3 of 3 positions shown · non-contrast
Comparison: MRI examination dated [DATE]

CLINICAL DATA: Patient arrived via wheelchair after seeing her PCP
this morning. DVT in right leg from knee to groin. Abscess on right
foot. Amputation in [REDACTED].

EXAM:
RIGHT FOOT COMPLETE - 3+ VIEW

[foot ap]
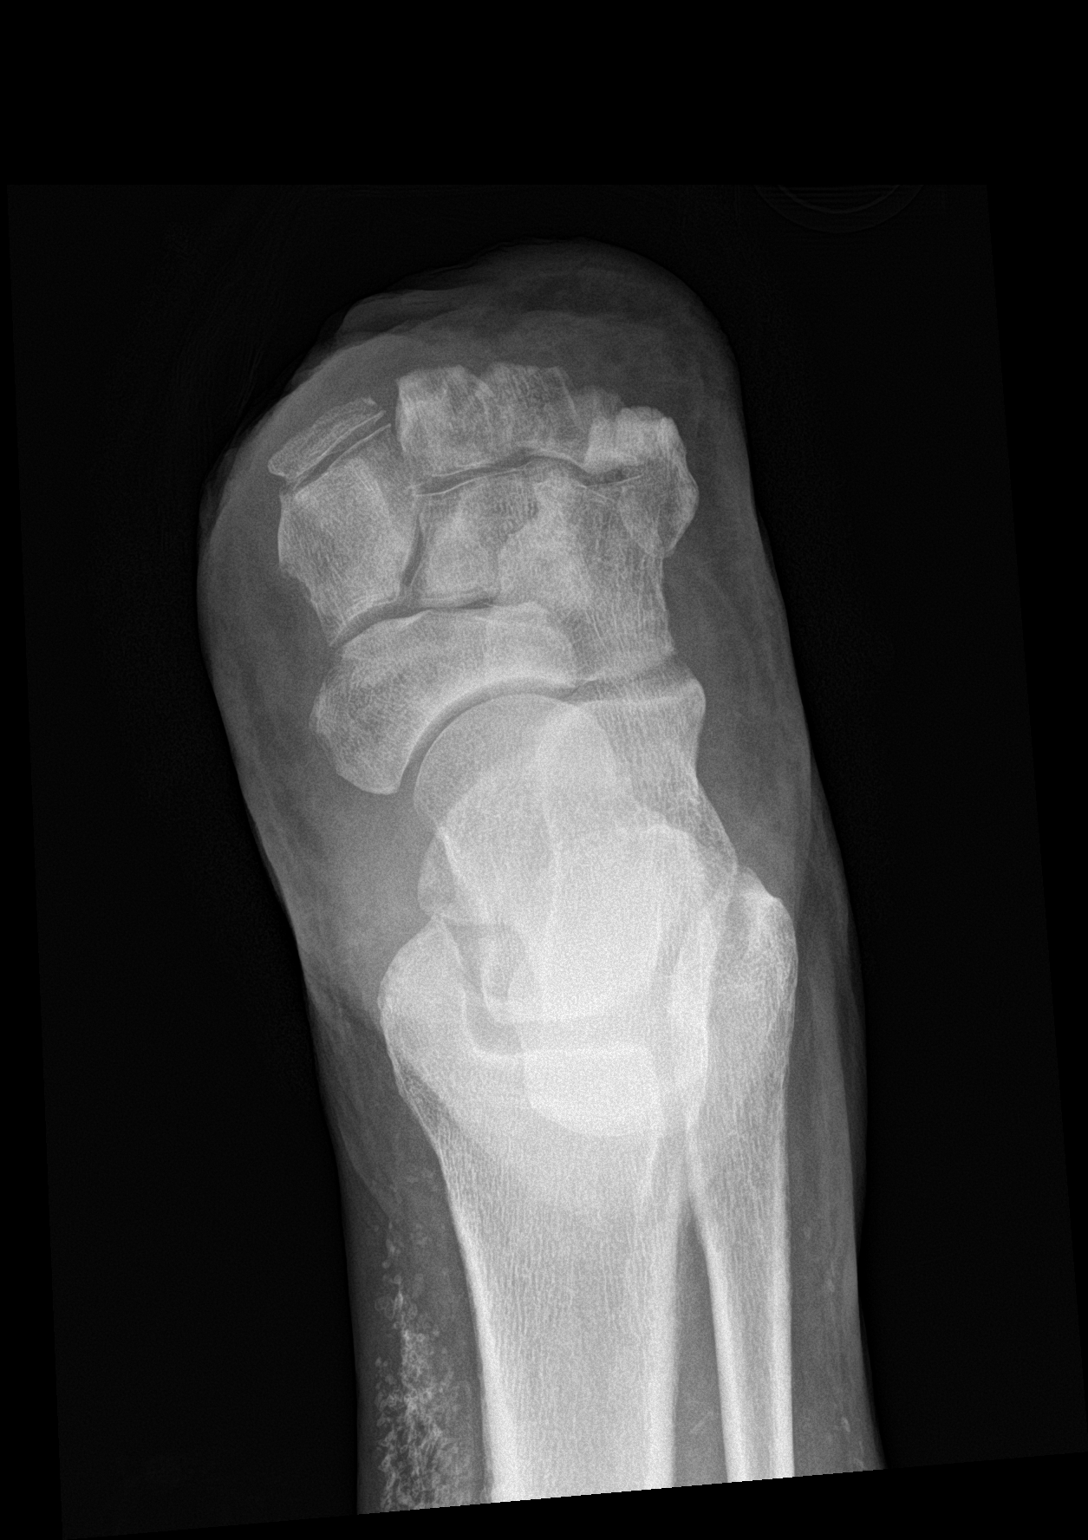

[foot obl]
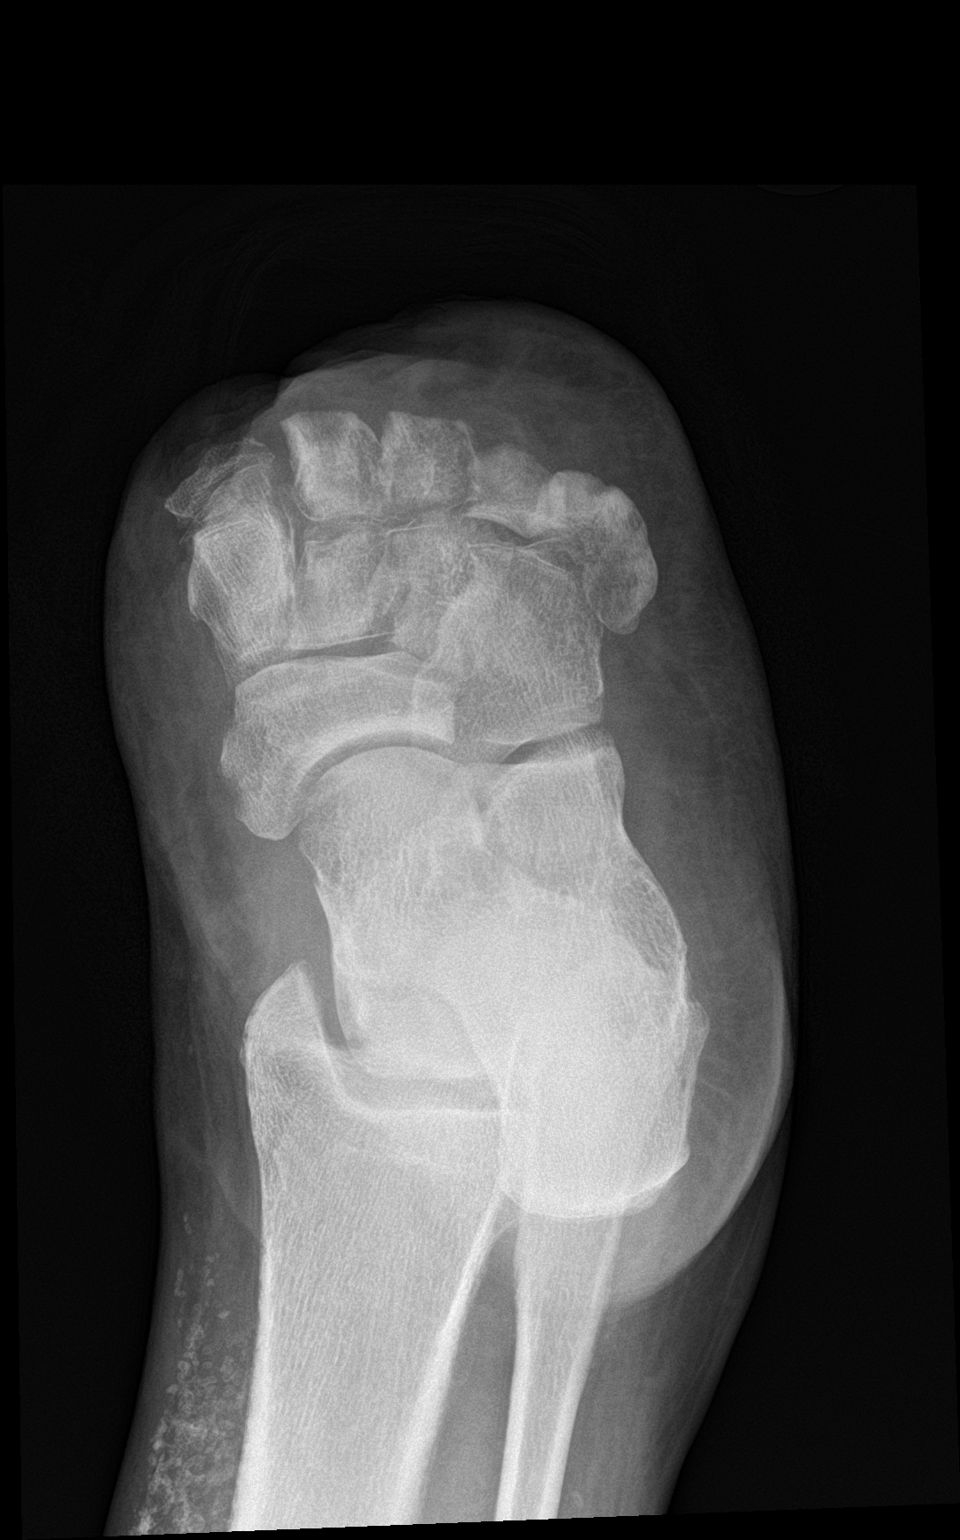

[foot lat]
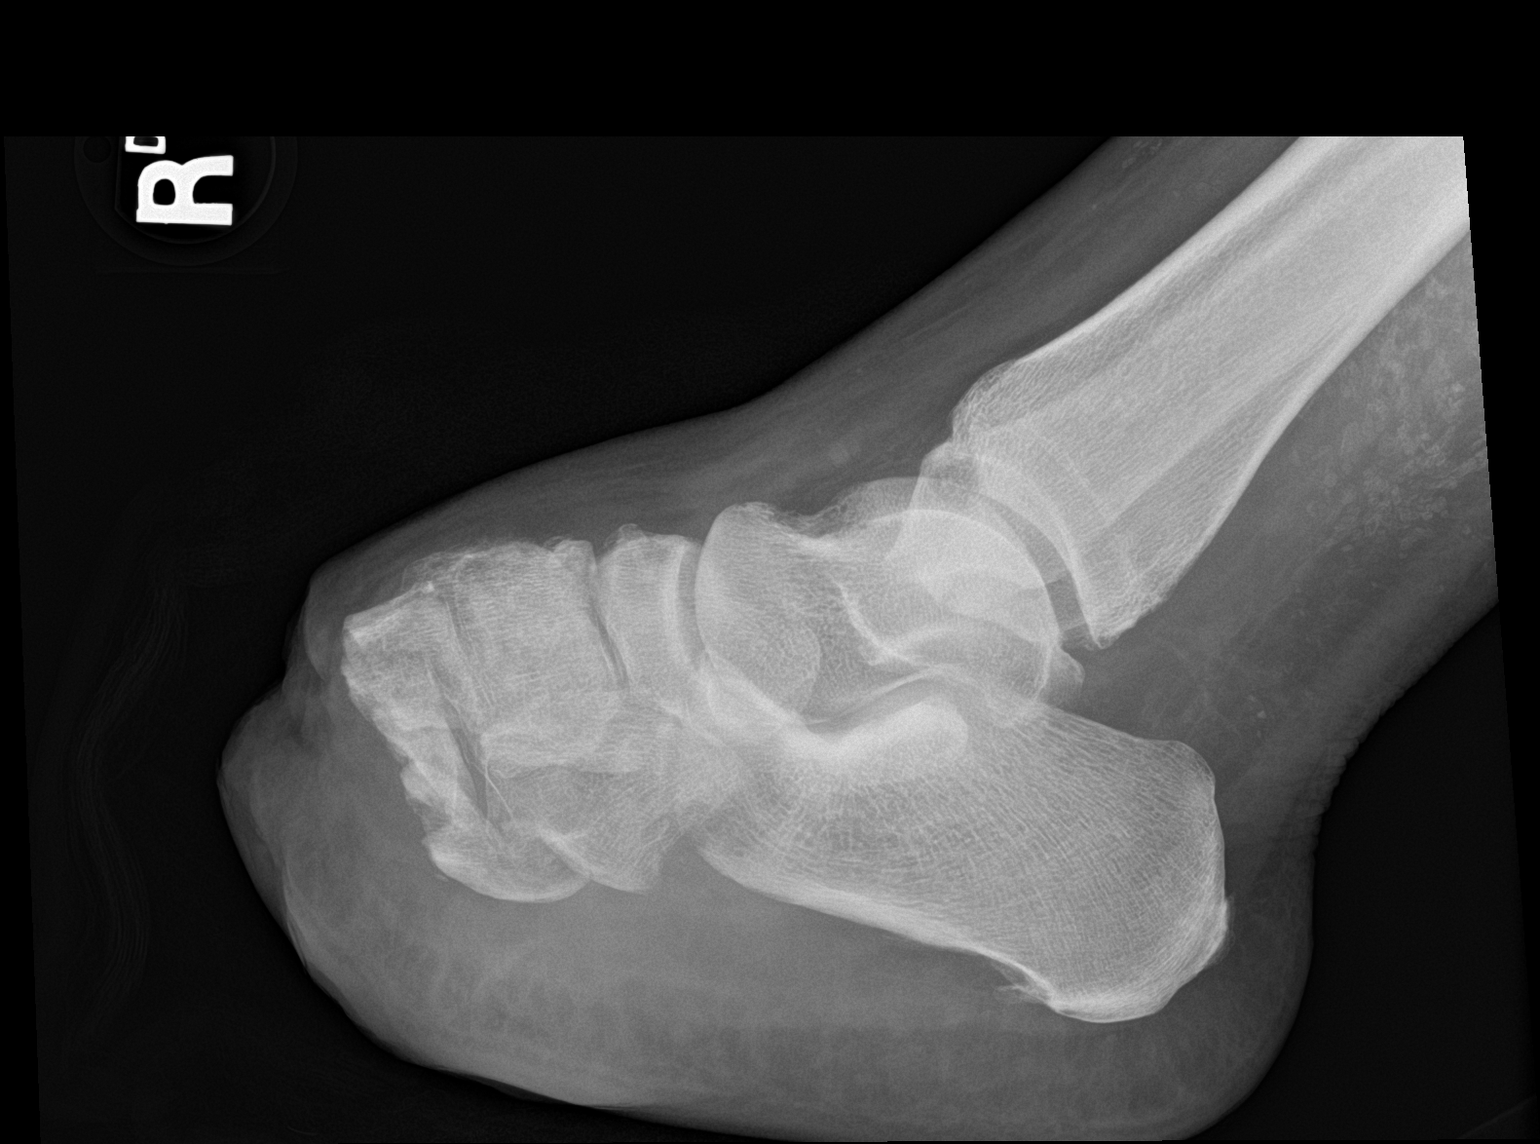

[3 of 3 positions shown; findings below may reference images not displayed]

FINDINGS: Status post prior amputation through the bases of the metatarsals.
No cortical erosion or periosteal reaction concerning for
osteomyelitis. Osteopenia. Marked subcutaneous soft tissue edema and
swelling about the foot.
IMPRESSION: 1. Status post amputation through the bases of the metatarsals
without radiographic evidence of acute osteomyelitis. Evaluation
early osteomyelitis is however limited on radiographs.

2. Marked skin thickening and subcutaneous soft tissue swelling
about the foot consistent with cellulitis.

## 2021-03-08 MED ORDER — INSULIN ASPART 100 UNIT/ML IJ SOLN
0.0000 [IU] | Freq: Three times a day (TID) | INTRAMUSCULAR | Status: DC
  Administered 2021-03-08: 7 [IU] via SUBCUTANEOUS
  Administered 2021-03-09: 4 [IU] via SUBCUTANEOUS
  Administered 2021-03-09: 7 [IU] via SUBCUTANEOUS
  Administered 2021-03-09: 4 [IU] via SUBCUTANEOUS
  Administered 2021-03-10: 15 [IU] via SUBCUTANEOUS
  Administered 2021-03-11: 4 [IU] via SUBCUTANEOUS
  Administered 2021-03-11 (×2): 11 [IU] via SUBCUTANEOUS
  Administered 2021-03-12: 3 [IU] via SUBCUTANEOUS
  Administered 2021-03-12: 4 [IU] via SUBCUTANEOUS
  Administered 2021-03-12 – 2021-03-13 (×2): 3 [IU] via SUBCUTANEOUS
  Administered 2021-03-13 – 2021-03-14 (×3): 4 [IU] via SUBCUTANEOUS
  Filled 2021-03-08: qty 0.2

## 2021-03-08 MED ORDER — SODIUM CHLORIDE 0.9 % IV SOLN
2.0000 g | Freq: Once | INTRAVENOUS | Status: AC
  Administered 2021-03-08: 2 g via INTRAVENOUS
  Filled 2021-03-08: qty 2

## 2021-03-08 MED ORDER — ONDANSETRON HCL 4 MG/2ML IJ SOLN
4.0000 mg | Freq: Four times a day (QID) | INTRAMUSCULAR | Status: DC | PRN
  Filled 2021-03-08: qty 2

## 2021-03-08 MED ORDER — LISINOPRIL 5 MG PO TABS
2.5000 mg | ORAL_TABLET | Freq: Every day | ORAL | Status: DC
  Administered 2021-03-09 – 2021-03-14 (×6): 2.5 mg via ORAL
  Filled 2021-03-08 (×8): qty 1

## 2021-03-08 MED ORDER — INSULIN GLARGINE-YFGN 100 UNIT/ML ~~LOC~~ SOLN
40.0000 [IU] | Freq: Every day | SUBCUTANEOUS | Status: DC
  Administered 2021-03-09 – 2021-03-10 (×2): 40 [IU] via SUBCUTANEOUS
  Filled 2021-03-08 (×3): qty 0.4

## 2021-03-08 MED ORDER — METRONIDAZOLE 500 MG/100ML IV SOLN
500.0000 mg | Freq: Two times a day (BID) | INTRAVENOUS | Status: DC
  Administered 2021-03-08 – 2021-03-13 (×10): 500 mg via INTRAVENOUS
  Filled 2021-03-08 (×10): qty 100

## 2021-03-08 MED ORDER — OXYCODONE HCL 5 MG PO TABS
5.0000 mg | ORAL_TABLET | ORAL | Status: DC | PRN
  Administered 2021-03-09 – 2021-03-14 (×11): 5 mg via ORAL
  Filled 2021-03-08 (×12): qty 1

## 2021-03-08 MED ORDER — ONDANSETRON HCL 4 MG PO TABS: 4.0000 mg | ORAL_TABLET | Freq: Four times a day (QID) | ORAL | Status: DC | PRN

## 2021-03-08 MED ORDER — SODIUM CHLORIDE 0.9 % IV SOLN
2.0000 g | Freq: Three times a day (TID) | INTRAVENOUS | Status: DC
  Administered 2021-03-09 – 2021-03-13 (×13): 2 g via INTRAVENOUS
  Filled 2021-03-08 (×14): qty 2

## 2021-03-08 MED ORDER — VANCOMYCIN HCL 1500 MG/300ML IV SOLN
1500.0000 mg | Freq: Two times a day (BID) | INTRAVENOUS | Status: DC
  Administered 2021-03-09 – 2021-03-13 (×8): 1500 mg via INTRAVENOUS
  Filled 2021-03-08 (×9): qty 300

## 2021-03-08 MED ORDER — INSULIN ASPART 100 UNIT/ML IJ SOLN
0.0000 [IU] | Freq: Every day | INTRAMUSCULAR | Status: DC
  Administered 2021-03-09: 2 [IU] via SUBCUTANEOUS
  Administered 2021-03-10: 5 [IU] via SUBCUTANEOUS
  Administered 2021-03-11: 3 [IU] via SUBCUTANEOUS
  Administered 2021-03-12: 2 [IU] via SUBCUTANEOUS
  Filled 2021-03-08: qty 0.05

## 2021-03-08 MED ORDER — ACETAMINOPHEN 650 MG RE SUPP: 650.0000 mg | Freq: Four times a day (QID) | RECTAL | Status: DC | PRN

## 2021-03-08 MED ORDER — SENNOSIDES-DOCUSATE SODIUM 8.6-50 MG PO TABS: 1.0000 | ORAL_TABLET | Freq: Every evening | ORAL | Status: DC | PRN

## 2021-03-08 MED ORDER — MORPHINE SULFATE (PF) 2 MG/ML IV SOLN: 2.0000 mg | INTRAVENOUS | Status: DC | PRN

## 2021-03-08 MED ORDER — GABAPENTIN 300 MG PO CAPS
600.0000 mg | ORAL_CAPSULE | Freq: Three times a day (TID) | ORAL | Status: DC
  Administered 2021-03-08 – 2021-03-14 (×16): 600 mg via ORAL
  Filled 2021-03-08 (×17): qty 2

## 2021-03-08 MED ORDER — VANCOMYCIN HCL 2000 MG/400ML IV SOLN
2000.0000 mg | Freq: Once | INTRAVENOUS | Status: AC
  Administered 2021-03-08: 2000 mg via INTRAVENOUS
  Filled 2021-03-08: qty 400

## 2021-03-08 MED ORDER — HEPARIN (PORCINE) 25000 UT/250ML-% IV SOLN
1900.0000 [IU]/h | INTRAVENOUS | Status: AC
  Administered 2021-03-08: 1700 [IU]/h via INTRAVENOUS
  Administered 2021-03-09 (×2): 1900 [IU]/h via INTRAVENOUS
  Filled 2021-03-08 (×3): qty 250

## 2021-03-08 MED ORDER — ACETAMINOPHEN 325 MG PO TABS
650.0000 mg | ORAL_TABLET | Freq: Four times a day (QID) | ORAL | Status: DC | PRN
  Administered 2021-03-09 – 2021-03-13 (×5): 650 mg via ORAL
  Filled 2021-03-08 (×5): qty 2

## 2021-03-08 NOTE — Assessment & Plan Note (Signed)
-   Outpatient follow-up °

## 2021-03-08 NOTE — ED Triage Notes (Signed)
Pt arrived via w/c after seeing her PCP this morning. DVT in rt leg from knee to groin, abscess on rt foot, amputation in dec.

## 2021-03-08 NOTE — Assessment & Plan Note (Addendum)
-   Patient was found to have right lower extremity DVT in January 2023 and has been currently on Eliquis. -Initially treated with heparin drip for the procedure but subsequently Eliquis was resumed on 03/10/2021 after clearance was given by orthopedics.

## 2021-03-08 NOTE — ED Provider Notes (Signed)
[name] Provider Note   CSN: [id number] Arrival date & time: 03/08/21  1102     History  Chief Complaint  Patient presents with   DVT   Wound Infection    [name] is a 41 y.o. female.  3 41 y.o female with a PMH of DM, right foot amputation is in the ED with a chief complaint of right foot abscess.  Patient was evaluated by her orthopedist this morning, was sent to the ED to rule out sepsis.  Patient had recent MRI which showed a accumulation of an abscess to the right foot after her amputation in December.  She is currently on Eliquis to treat a right leg DVT extending from her right upper right knee to the right upper groin.  He is not complaining of any worsening pain.  No fever, no chills, no other complaints.  The history is provided by the patient and medical records.      Home Medications Prior to Admission medications   Medication Sig Start Date End Date Taking? Authorizing Provider  acetaminophen (TYLENOL) 325 MG tablet Take 2 tablets (650 mg total) by mouth every 6 (six) hours as needed for mild pain (or Fever >/= 101). 12/23/20   Eugenie Filler, MD  amoxicillin-clavulanate (AUGMENTIN) 875-125 MG tablet Take 1 tablet by mouth 2 (two) times daily. 03/02/21   Rosiland Oz, MD  apixaban (ELIQUIS) 5 MG TABS tablet Take  one-25m tablet twice daily. 02/22/21   EKerin Perna NP  Blood Glucose Monitoring Suppl (TRUE METRIX METER) w/Device KIT Use to check blood sugar twice a day. 02/14/21   NCharlott Rakes MD  gabapentin (NEURONTIN) 300 MG capsule Take 2 capsules by mouth 3 times daily. 12/23/20   TEugenie Filler MD  glucose blood (TRUE METRIX BLOOD GLUCOSE TEST) test strip Use to check blood sugar twice a day. 02/14/21   NCharlott Rakes MD  Insulin Glargine (BASAGLAR KWIKPEN) 100 UNIT/ML Inject 60 Units into the skin daily. 02/14/21   NCharlott Rakes MD  Insulin Pen Needle 32G X 4 MM MISC use as directed 02/14/21   NCharlott Rakes MD  lisinopril (ZESTRIL) 2.5 MG tablet Take 1 tablet by mouth daily 01/17/21   EKerin Perna NP  metFORMIN (GLUCOPHAGE-XR) 500 MG 24 hr tablet Take 2 tablets (1,000 mg total) by mouth 2 (two) times daily. 02/14/21   NCharlott Rakes MD  ondansetron (ZOFRAN) 4 MG tablet Take 1 tablet (4 mg total) by mouth every 6 (six) hours as needed for nausea. 12/23/20   TEugenie Filler MD  oxyCODONE (OXY IR/ROXICODONE) 5 MG immediate release tablet Take 1 tablet (5 mg total) by mouth every 3 (three) hours as needed for moderate pain. 12/23/20   TEugenie Filler MD  TRUEplus Lancets 28G MISC Use to check blood sugar twice a day. 02/14/21   NCharlott Rakes MD      Allergies    Patient has no known allergies.    Review of Systems   Review of Systems  Physical Exam Updated Vital Signs BP (!) 101/54    Pulse 77    Temp 98.4 F (36.9 C) (Oral)    Resp 18    Ht 5' 9"  (1.753 m)    Wt 135.6 kg    SpO2 100%    BMI 44.15 kg/m  Physical Exam Vitals and nursing note reviewed.  Constitutional:      Appearance: Normal appearance.  HENT:     Head: Normocephalic and  atraumatic.  Eyes:     Pupils: Pupils are equal, round, and reactive to light.  Cardiovascular:     Rate and Rhythm: Normal rate.  Pulmonary:     Effort: Pulmonary effort is normal.     Breath sounds: No wheezing.  Abdominal:     General: Abdomen is flat.  Musculoskeletal:     Cervical back: Normal range of motion and neck supple.     Right Lower Extremity: Right leg is amputated below ankle.  Skin:    General: Skin is warm and dry.  Neurological:     Mental Status: She is alert and oriented to person, place, and time.         ED Results / Procedures / Treatments   Labs (all labs ordered are listed, but only abnormal results are displayed) Labs Reviewed  COMPREHENSIVE METABOLIC PANEL - Abnormal; Notable for the following components:      Result Value   Glucose, Bld 176 (*)    All other components within normal  limits  CBG MONITORING, ED - Abnormal; Notable for the following components:   Glucose-Capillary 184 (*)    All other components within normal limits  CULTURE, BLOOD (ROUTINE X 2)  CULTURE, BLOOD (ROUTINE X 2)  CBC WITH DIFFERENTIAL/PLATELET  PROTIME-INR    EKG None  Radiology No results found.  Procedures Procedures    Medications Ordered in ED Medications  vancomycin (VANCOREADY) IVPB 2000 mg/400 mL (has no administration in time range)    ED Course/ Medical Decision Making/ A&P                           Medical Decision Making Amount and/or Complexity of Data Reviewed Labs: ordered. Radiology: ordered. ECG/medicine tests: ordered.  Risk Prescription drug management. Decision regarding hospitalization.   This patient presents to the ED for concern of right foot pain, this involves a number of treatment options, and is a complaint that carries with it a high risk of complications and morbidity.  The differential diagnosis includes osteomyelitis, sepsis.  Was evaluated by orthopedics this morning, sent to the ED to rule out sepsis.  Recent right fluid amputation in the month of December 2022.  Known diagnoses of a right DVT, currently on Eliquis.   Co morbidities: Discussed in HPI   Brief History:  Patient with a previous history of diabetes status post right foot amputation by [name], MD the month of December 2022.  Here to rule out sepsis.   EMR reviewed including pt PMHx, past surgical history and past visits to ER.   See HPI for more details   Lab Tests:  I ordered and independently interpreted labs.  The pertinent results include:    I personally reviewed all laboratory work and imaging. Metabolic panel without any acute abnormality specifically kidney function within normal limits and no significant electrolyte abnormalities. CBC without leukocytosis or significant anemia.  Cultures have been obtained.  PT and INR at  baseline.   Imaging Studies:  MRI of the right foot showed:  1. Large soft tissue wound overlying the transmetatarsal amputation with surrounding cellulitis. Bone marrow edema within the metatarsal stumps concerning for residual osteomyelitis. Complex fluid collection along the plantar medial aspect of the foot at the level of the TMT joints measuring 4.5 x 1 x 2.8 cm with thick peripheral enhancement and small amount air within the collection with a sinus tract extending to the skin surface consistent with an abscess. 2.  Mild marrow edema along the distal dorsal plantar aspect of the medial cuneiform. Mild bone marrow edema along the distal plantar aspect of the cuboid. This may reflect reactive marrow changes secondary to arthritis versus early osteomyelitis.  Medicines ordered:  I ordered medication including vancomycin for early treatment of osteomyelitis. Reevaluation of the patient after these medicines showed that the patient stayed the same I have reviewed the patients home medicines and have made adjustments as needed  Consults:  I requested consultation with orthopedic surgery,  and discussed lab and imaging findings as well as pertinent plan - they recommend: HOLD Eliquis if medicine team clears.might try not do surgery  Social Determinants of Health:  The patient's social determinants of health were a factor in the care of this patient    Problem List / ED Course:  And here status post right foot amputation in the month of December, sent here to rule out sepsis.  Recent MRI of the right foot shows some early osteomyelitis.  Consultation was made to orthopedic surgery who recommended.   Dispostion:  After consideration of the diagnostic results and the patients response to treatment, I feel that the patent would benefit from IV antibiotics.  Per orthopedics patient will need to be hospitalized due to uncontrolled blood sugars, sending infection to the right foot.   She may or may not need surgery in the upcoming days.  Patient will also need ultrasound to check status on her DVT as Eliquis might need to be discontinued prior to surgery.  Spoke to hospitalist Dr. Starla Link who recommended anaerobic coverage.  Additional antibiotics have been added.  COVID test was also added.  Patient hemodynamically stable for admission.   Portions of this note were generated with L[employer]. Dictation errors may occur despite best attempts at proofreading.   Final Clinical Impression(s) / ED Diagnoses Final diagnoses:  Right foot infection  Osteomyelitis of right foot, unspecified type [facility])    Rx / DC Orders ED Discharge Orders     None         Janeece Fitting, PA-C 03/08/21 1511    Daleen Bo, MD 03/08/21 1511

## 2021-03-08 NOTE — Assessment & Plan Note (Addendum)
-  Continue broad-spectrum antibiotics.  ID following.  -underwent I&D of right foot transmetatarsal amputation on 03/10/2021 by orthopedics.  -continue pain management. -PICC line placed possibly today. -ID has cleared the patient for discharge on IV Rocephin and oral Flagyl till 03/31/2021.  Outpatient follow-up with ID and orthopedics.  Discharge patient home today. -Discharge wound care as per orthopedic/wound care consult recommendations

## 2021-03-08 NOTE — Hospital Course (Addendum)
41 y.o. 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  Patient had recent MRI of right foot on 02/28/2021 which had some cellulitis/residual osteomyelitis and possible abscess.  She was started on broad-spectrum antibiotics.  ID was also consulted.  She underwent I&D of right foot transmetatarsal amputation on 03/10/2021 by orthopedics. Subsequently, she has remained stable.  She underwent PICC line placement today ID has cleared the patient for discharge on IV Rocephin and oral Flagyl till 03/31/2021.  Outpatient follow-up with ID and orthopedics.  Discharge patient home today.

## 2021-03-08 NOTE — Progress Notes (Addendum)
A consult was received from an ED provider for Vancomycin and Cefepime per pharmacy dosing.  The patient's profile has been reviewed for ht/wt/allergies/indication/available labs.    A one time order has been placed for Vancomycin 2g IV and Cefepime 2g IV.  Further antibiotics/pharmacy consults should be ordered by admitting physician if indicated.                       Thank you, Jamse Mead 03/08/2021  2:38 PM

## 2021-03-08 NOTE — Progress Notes (Signed)
Right lower extremity venous duplex completed. Refer to "CV Proc" under chart review to view preliminary results.  03/08/2021 4:16 PM Eula Fried., MHA, RVT, RDCS, RDMS

## 2021-03-08 NOTE — Assessment & Plan Note (Addendum)
-   Continue home regimen.  Carb modified diet.  Outpatient follow-up with PCP

## 2021-03-08 NOTE — Assessment & Plan Note (Addendum)
-   Monitor blood pressure.  Continue lisinopril.

## 2021-03-08 NOTE — Progress Notes (Signed)
Pharmacy Antibiotic Note  [name] is a 41 y.o. 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 Recent outpatient MRI on 02/28/2021 had shown cellulitis and features of residual osteomyelitis with possible abscess. Pharmacy has been consulted for Vancomycin dosing.  Plan: Vancomycin 2g IV x 1, then 1500mg  IV q12h for estimated AUC of 513 Vancomycin levels at steady state, as indicated Cefepime and Metronidazole per MD Monitor renal function, cultures, clinical course  Height: 5\' 9"  (175.3 cm) Weight: 135.6 kg (299 lb) IBW/kg (Calculated) : 66.2  Temp (24hrs), Avg:98.3 F (36.8 C), Min:98.1 F (36.7 C), Max:98.4 F (36.9 C)  Recent Labs  Lab 03/08/21 1224  WBC 8.3  CREATININE 0.74    Estimated Creatinine Clearance: 140.1 mL/min (by C-G formula based on SCr of 0.74 mg/dL).    No Known Allergies  Antimicrobials this admission: 2/23 Vancomycin >> 2/23 Cefepime >> 2/23 Metronidazole >>  Dose adjustments this admission: --  Microbiology results: 2/23 BCx:    Thank you for allowing pharmacy to be a part of this patients care.   3/23, PharmD, BCPS Clinical Pharmacist  03/08/2021 4:57 PM

## 2021-03-08 NOTE — Plan of Care (Signed)

## 2021-03-08 NOTE — H&P (Signed)
History and Physical    Patient: [name] TGG:[id number] DOB: 11/28/1980 DOA: 03/08/2021 DOS: the patient was seen and examined on 03/08/2021 PCP: [name] Perna, NP  Patient coming from: Home  Chief Complaint:  Sent by orthopedics for possible wound infection  HPI: [name] is a 41 y.o. female with medical history significant of right foot osteomyelitis status post right transmetatarsal amputation with subsequent wound dehiscence currently being treated by oral Augmentin as an outpatient; right lower extremity DVT diagnosed in January 2023 currently on Eliquis; diabetes mellitus type 2; hypertension; morbid obesity was seen by orthopedics in the office today for possible wound infection and was referred to the hospital for possible hospitalization with need for IV antibiotics and possible surgery.  Patient had recent MRI of right foot on 02/28/2021 which had some cellulitis/residual osteomyelitis and possible abscess.  She was being treated by oral Augmentin as an outpatient.  She denies worsening lower extremity pain, fever, nausea, vomiting, chills, loss of consciousness, seizures.  Denies abdominal pain, bleeding from orifices, syncope.  ED course: -She was given broad-spectrum antibiotics.  Hospitalist service was called to evaluate the patient.  Review of Systems: As mentioned in the history of present illness. All other systems reviewed and are negative. Past Medical History:  Diagnosis Date   Class 3 obesity ([place]) 12/16/2020   Diabetes mellitus    Past Surgical History:  Procedure Laterality Date   EXTREMITY CYST EXCISION     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   Procedure: TRANSMETATARSAL AMPUTATION;  Surgeon: Armond Hang, MD;  Location: [facility] ORS;  Service: Orthopedics;  Laterality: Right;   Social History:  reports that she has quit smoking. Her smoking use included cigarettes. She does not have any smokeless tobacco history on file. She reports current  alcohol use. She reports current drug use. Drug: Marijuana.  No Known Allergies  Family History  Problem Relation Age of Onset   Hypertension Mother    Diabetes Mother    Diabetes Other     Prior to Admission medications   Medication Sig Start Date End Date Taking? Authorizing Provider  acetaminophen (TYLENOL) 325 MG tablet Take 2 tablets (650 mg total) by mouth every 6 (six) hours as needed for mild pain (or Fever >/= 101). 12/23/20   [name] Filler, MD  amoxicillin-clavulanate (AUGMENTIN) 875-125 MG tablet Take 1 tablet by mouth 2 (two) times daily. 03/02/21   [name] Oz, MD  apixaban (ELIQUIS) 5 MG TABS tablet Take  one-76m tablet twice daily. 02/22/21   EKerin Perna NP  Blood Glucose Monitoring Suppl (TRUE METRIX METER) w/Device KIT Use to check blood sugar twice a day. 02/14/21   [name] Rakes MD  gabapentin (NEURONTIN) 300 MG capsule Take 2 capsules by mouth 3 times daily. 12/23/20   [name] Filler MD  glucose blood (TRUE METRIX BLOOD GLUCOSE TEST) test strip Use to check blood sugar twice a day. 02/14/21   [name] Rakes MD  Insulin Glargine (BASAGLAR KWIKPEN) 100 UNIT/ML Inject 60 Units into the skin daily. 02/14/21   [name] Rakes MD  Insulin Pen Needle 32G X 4 MM MISC use as directed 02/14/21   [name] Rakes MD  lisinopril (ZESTRIL) 2.5 MG tablet Take 1 tablet by mouth daily 01/17/21   EKerin Perna NP  metFORMIN (GLUCOPHAGE-XR) 500 MG 24 hr tablet Take 2 tablets (1,000 mg total) by mouth 2 (two) times daily. 02/14/21   [name] Rakes MD  ondansetron (ZOFRAN) 4 MG tablet Take 1 tablet (4 mg  total) by mouth every 6 (six) hours as needed for nausea. 12/23/20   [name] Filler, MD  oxyCODONE (OXY IR/ROXICODONE) 5 MG immediate release tablet Take 1 tablet (5 mg total) by mouth every 3 (three) hours as needed for moderate pain. 12/23/20   [name] Filler, MD  TRUEplus Lancets 28G MISC Use to check blood sugar twice a day. 02/14/21   Charlott Rakes, MD     Physical Exam: Vitals:   03/08/21 1230 03/08/21 1245 03/08/21 1315 03/08/21 1445  BP: 103/71  113/73 (!) 101/54  Pulse: 79 82 79 77  Resp:   18 18  Temp:      TempSrc:      SpO2: 100% 100% 100% 100%  Weight:      Height:       General: No acute distress, currently on room air ENT/neck: No elevated JVD.  No obvious masses  respiratory: Bilateral decreased breath sounds at bases with some scattered crackles CVS: S1-S2 heard, rate controlled Abdominal: Soft, morbidly obese, nontender, nondistended, no organomegaly, bowel sounds heard Extremities: No cyanosis, clubbing, edema CNS: Alert, awake and oriented.  No focal neurologic deficit.  Moving extremities. Lymph: No cervical lymphadenopathy Skin: Right foot dressing present.  No obvious ecchymosis/petechiae  psych: Normal mood, affect and judgment Musculoskeletal: No obvious joint deformity/tenderness/swelling   Data Reviewed: I have reviewed patient's investigation including recent MRI of foot myself.  Today's sodium is 135, potassium of 4.4, BUN of 15, creatinine of 0.74, WBC of 8.3, hemoglobin of 12.9, platelets of 283.  I have reviewed patient's recent MRI of right foot as well.  Assessment and Plan: Osteomyelitis of right foot ([place])- (present on admission) - Patient has history of right foot osteomyelitis requiring right transmetatarsal amputation on 12/18/2020 by orthopedics with subsequent wound dehiscence for which patient is currently being treated by oral Augmentin as an outpatient -Was seen by orthopedics today in the office: Recommended hospitalization for IV antibiotics and possible need for more surgical intervention. -Recent outpatient MRI on 02/28/2021 had shown cellulitis and features of residual osteomyelitis with possible abscess -Start cefepime, vancomycin and Flagyl.  Follow orthopedic recommendations.  Pain management.  Type 2 diabetes mellitus with hyperglycemia ([place])- (present on admission) - Continue  long-acting insulin at a decreased dose.  Continue CBGs with SSI.  Carb modified diet.  HTN (hypertension) - Monitor blood pressure.  Resume lisinopril.  Class 3 obesity ([facility])- (present on admission) - Outpatient follow-up  Deep vein thrombosis (DVT) of right lower extremity ([place]) - Patient was found to have right lower extremity DVT in January 2023 and has been currently on Eliquis. -Hold Eliquis.  Start heparin drip.       Advance Care Planning:   Code Status: Full Code   Consults: ED provider spoke to orthopedics/Dr. Kathaleen Bury  Family Communication: None at bedside  Severity of Illness: The appropriate patient status for this patient is INPATIENT. Inpatient status is judged to be reasonable and necessary in order to provide the required intensity of service to ensure the patient's safety. The patient's presenting symptoms, physical exam findings, and initial radiographic and laboratory data in the context of their chronic comorbidities is felt to place them at high risk for further clinical deterioration. Furthermore, it is not anticipated that the patient will be medically stable for discharge from the hospital within 2 midnights of admission.   * I certify that at the point of admission it is my clinical judgment that the patient will require inpatient hospital care spanning beyond 2  midnights from the point of admission due to high intensity of service, high risk for further deterioration and high frequency of surveillance required.*  Author: Aline August, MD 03/08/2021 3:25 PM  For on call review [email or web address].

## 2021-03-08 NOTE — Progress Notes (Addendum)
ANTICOAGULATION CONSULT NOTE - Initial Consult  Pharmacy Consult for IV heparin while Apixaban on hold Indication: history of RLE DVT (January 2023)  No Known Allergies  Patient Measurements: Height: 5\' 9"  (175.3 cm) Weight: 135.6 kg (299 lb) IBW/kg (Calculated) : 66.2 Heparin Dosing Weight: 98.6 kg  Vital Signs: Temp: 98.1 F (36.7 C) (02/23 1545) Temp Source: Oral (02/23 1545) BP: 112/69 (02/23 1545) Pulse Rate: 83 (02/23 1545)  Labs: Recent Labs    03/08/21 1224  HGB 12.9  HCT 41.0  PLT 283  APTT 28  LABPROT 13.2  INR 1.0  HEPARINUNFRC <0.10*  CREATININE 0.74    Estimated Creatinine Clearance: 140.1 mL/min (by C-G formula based on SCr of 0.74 mg/dL).   Medical History: Past Medical History:  Diagnosis Date   Class 3 obesity ([facility]) 12/16/2020   Diabetes mellitus     Medications:  PTA Apixaban 5mg  PO BID - last dose reported as 03/06/21 at 0100  Assessment: 40 y/oF with PMH of right foot osteomyelitis s/p right transmetatarsal amputation with subsequent wound dehiscence, RLE DVT diagnosed in January 2023 currently on Apixaban, DM2, HTN, morbid obesity who was seen by orthopedics in the office today for possible wound infection and was referred to the hospital for need for IV antibiotics and possible surgery. Pharmacy consulted for IV heparin dosing while Apixaban held. H/H, Pltc WNL. PT/INR 13.2/1. aPTT 28 seconds. Heparin level can be falsely elevated with recent Apixaban, but baseline heparin level on admission < 0.1 units/mL, so can adjust/titrate heparin infusion using heparin levels.    Goal of Therapy:  Heparin level 0.3-0.7 units/ml Monitor platelets by anticoagulation protocol: Yes   Plan:  Start heparin infusion at 1700 units/hr (no initial bolus due to recent Apixaban) Heparin level 6 hours after initiation Daily CBC, heparin level Monitor closely for s/sx of bleeding Ortho: Please indicate when heparin needs to be held prior to procedure   03/08/21, PharmD, BCPS Clinical Pharmacist  03/08/2021,4:30 PM   Addendum: Per Dr. [name] Pickerel, stop heparin 6 hours prior to procedure on 03/09/21   Odis Hollingshead, PharmD, BCPS Clinical Pharmacist  03/08/2021 7:42 PM

## 2021-03-09 DIAGNOSIS — E871 Hypo-osmolality and hyponatremia: Secondary | ICD-10-CM

## 2021-03-09 DIAGNOSIS — E1165 Type 2 diabetes mellitus with hyperglycemia: Secondary | ICD-10-CM | POA: Diagnosis not present

## 2021-03-09 DIAGNOSIS — M869 Osteomyelitis, unspecified: Secondary | ICD-10-CM

## 2021-03-09 DIAGNOSIS — I825Z1 Chronic embolism and thrombosis of unspecified deep veins of right distal lower extremity: Secondary | ICD-10-CM | POA: Diagnosis not present

## 2021-03-09 LAB — COMPREHENSIVE METABOLIC PANEL WITH GFR
ALT: 11 U/L (ref 0–44)
AST: 12 U/L — ABNORMAL LOW (ref 15–41)
Albumin: 3.2 g/dL — ABNORMAL LOW (ref 3.5–5.0)
Alkaline Phosphatase: 64 U/L (ref 38–126)
Anion gap: 8 (ref 5–15)
BUN: 17 mg/dL (ref 6–20)
CO2: 24 mmol/L (ref 22–32)
Calcium: 8.9 mg/dL (ref 8.9–10.3)
Chloride: 102 mmol/L (ref 98–111)
Creatinine, Ser: 0.78 mg/dL (ref 0.44–1.00)
GFR, Estimated: >60 mL/min
Glucose, Bld: 174 mg/dL — ABNORMAL HIGH (ref 70–99)
Potassium: 3.9 mmol/L (ref 3.5–5.1)
Sodium: 134 mmol/L — ABNORMAL LOW (ref 135–145)
Total Bilirubin: 0.3 mg/dL (ref 0.3–1.2)
Total Protein: 6.9 g/dL (ref 6.5–8.1)

## 2021-03-09 LAB — GLUCOSE, CAPILLARY
Glucose-Capillary: 156 mg/dL — ABNORMAL HIGH (ref 70–99)
Glucose-Capillary: 225 mg/dL — ABNORMAL HIGH (ref 70–99)
Glucose-Capillary: 158 mg/dL — ABNORMAL HIGH (ref 70–99)
Glucose-Capillary: 228 mg/dL — ABNORMAL HIGH (ref 70–99)

## 2021-03-09 LAB — HEPARIN LEVEL (UNFRACTIONATED)
Heparin Unfractionated: 0.27 [IU]/mL — ABNORMAL LOW (ref 0.30–0.70)
Heparin Unfractionated: 0.49 IU/mL (ref 0.30–0.70)
Heparin Unfractionated: 0.44 IU/mL (ref 0.30–0.70)

## 2021-03-09 LAB — C-REACTIVE PROTEIN: CRP: 4.4 mg/dL — ABNORMAL HIGH

## 2021-03-09 LAB — CBC
HCT: 37 % (ref 36.0–46.0)
Hemoglobin: 11.8 g/dL — ABNORMAL LOW (ref 12.0–15.0)
MCH: 26.5 pg (ref 26.0–34.0)
MCHC: 31.9 g/dL (ref 30.0–36.0)
MCV: 83 fL (ref 80.0–100.0)
Platelets: 266 10*3/uL (ref 150–400)
RBC: 4.46 MIL/uL (ref 3.87–5.11)
RDW: 15 % (ref 11.5–15.5)
WBC: 9 10*3/uL (ref 4.0–10.5)
nRBC: 0 % (ref 0.0–0.2)

## 2021-03-09 LAB — MAGNESIUM: Magnesium: 1.9 mg/dL (ref 1.7–2.4)

## 2021-03-09 MED ORDER — CHLORHEXIDINE GLUCONATE 4 % EX LIQD
60.0000 mL | Freq: Once | CUTANEOUS | Status: AC
  Administered 2021-03-10: 4 via TOPICAL

## 2021-03-09 NOTE — Assessment & Plan Note (Addendum)
-   Improving.  Sodium 134 today.  Encourage oral intake.  Outpatient follow-up of BMP.  Treated with gentle hydration on 03/10/2021 and subsequently discontinued.

## 2021-03-09 NOTE — Plan of Care (Signed)
Problem: Education: Goal: Knowledge of General Education information will improve Description: Including pain rating scale, medication(s)/side effects and non-pharmacologic comfort measures 03/09/2021 1214 by Haydee Salter, RN Outcome: Progressing 03/09/2021 1214 by Haydee Salter, RN Outcome: Progressing   Problem: Clinical Measurements: Goal: Cardiovascular complication will be avoided 03/09/2021 1214 by Haydee Salter, RN Outcome: Progressing 03/09/2021 1214 by Haydee Salter, RN Outcome: Progressing   Problem: Safety: Goal: Ability to remain free from injury will improve 03/09/2021 1214 by Haydee Salter, RN Outcome: Progressing 03/09/2021 1214 by Haydee Salter, RN Outcome: Progressing   Haydee Salter, RN 03/09/21 12:15 PM

## 2021-03-09 NOTE — Progress Notes (Signed)
ANTICOAGULATION CONSULT NOTE - follow up  Pharmacy Consult for IV heparin while Apixaban on hold Indication: history of RLE DVT (January 2023)  No Known Allergies  Patient Measurements: Height: 5\' 9"  (175.3 cm) Weight: 135.6 kg (299 lb) IBW/kg (Calculated) : 66.2 Heparin Dosing Weight: 98.6 kg  Vital Signs: Temp: 98.6 F (37 C) (02/23 2221) Temp Source: Oral (02/23 1809) BP: 120/79 (02/24 0142) Pulse Rate: 77 (02/24 0142)  Labs: Recent Labs    03/08/21 1224 03/09/21 0054  HGB 12.9 11.8*  HCT 41.0 37.0  PLT 283 266  APTT 28  --   LABPROT 13.2  --   INR 1.0  --   HEPARINUNFRC <0.10* 0.27*  CREATININE 0.74 0.78     Estimated Creatinine Clearance: 140.1 mL/min (by C-G formula based on SCr of 0.78 mg/dL).   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 Pharmacy consulted for IV heparin dosing while Apixaban held. H/H, Pltc WNL. PT/INR 13.2/1. aPTT 28 seconds. Heparin level can be falsely elevated with recent Apixaban, but baseline heparin level on admission < 0.1 units/mL, so can adjust/titrate heparin infusion using heparin levels.    HL 0.27 subtherapeutic on 1700 units/hr Hgb 11.8, Plts WNL Per RN no bleeding or interruptions  Goal of Therapy:  Heparin level 0.3-0.7 units/ml Monitor platelets by anticoagulation protocol: Yes   Plan:  Increase heparin drip to 1900 unit/hr Daily CBC, heparin level Monitor closely for s/sx of bleeding Per Dr.  Kathaleen Bury, stop heparin 6 hours (0930) prior to procedure on 03/09/21

## 2021-03-09 NOTE — Progress Notes (Signed)
°  Progress Note   Patient: [name] [id number] DOB: [birth year] DOA: 03/08/2021     1 DOS: the patient was seen and examined on 03/09/2021   Brief hospital course:  41 y.o. 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  Patient had recent MRI of right foot on 02/28/2021 which had some cellulitis/residual osteomyelitis and possible abscess.  She was started on broad-spectrum antibiotics.  Assessment and Plan: Osteomyelitis of right foot ([place])- (present on admission) - Patient has history of right foot osteomyelitis requiring right transmetatarsal amputation on 12/18/2020 by orthopedics with subsequent wound dehiscence for which patient was currently being treated by oral Augmentin as an outpatient -Was seen by orthopedics in the office: Recommended hospitalization for IV antibiotics and possible need for more surgical intervention. -Recent outpatient MRI on 02/28/2021 had shown cellulitis and features of residual osteomyelitis with possible abscess -Continue broad-spectrum antibiotics.  Keep patient n.p.o. for possible orthopedic intervention today.  Continue pain management.  Type 2 diabetes mellitus with hyperglycemia ([place])- (present on admission) - Continue long-acting insulin at a decreased dose.  Continue CBGs with SSI.  Carb modified diet.  HTN (hypertension) - Monitor blood pressure.  Continue lisinopril.  Class 3 obesity ([place])- (present on admission) - Outpatient follow-up  Hyponatremia - Mild.  Monitor  intermittently.  Deep vein thrombosis (DVT) of right lower extremity ([place]) - Patient was found to have right lower extremity DVT in January 2023 and has been currently on Eliquis. -Continue heparin drip.  Eliquis on hold.        Subjective:  Patient seen and examined at bedside.  No fever, vomiting, worsening shortness of breath reported.  Physical Exam: Vitals:   03/08/21 2221 03/09/21 0142 03/09/21 0453 03/09/21 0500  BP: 130/74 120/79 109/72   Pulse: 92 77 82   Resp: 18 18 16    Temp: 98.6 F (37 C)  98.1 F (36.7 C)   TempSrc:      SpO2: 99% 97% 100%   Weight:    136 kg  Height:       General: No acute distress, currently on room air ENT/neck: No elevated JVD.  No obvious masses  respiratory: Bilateral decreased breath sounds at bases with some scattered crackles CVS: S1-S2 heard, rate controlled Abdominal: Soft, morbidly obese, nontender, nondistended, no organomegaly, bowel sounds heard Extremities: No cyanosis, clubbing, edema CNS: Alert, awake and oriented.  No focal neurologic deficit.  Moving extremities. Lymph: No cervical lymphadenopathy Skin: No obvious ecchymosis.  Right foot dressing present  psych: Normal mood, affect and judgment Musculoskeletal: No obvious joint deformity/tenderness/swelling   Data Reviewed: I have reviewed patient's investigation during this hospitalization myself.  Today's sodium of 134, potassium of 3.9, creatinine of 0.78, CRP of 4.4, WBC of 9, hemoglobin of 11.8, platelets of 266  Family Communication: None at bedside  Disposition: Status is: Inpatient Remains inpatient appropriate because: Of need for surgical intervention     Planned Discharge Destination: Home     Time spent: 50 minutes  Author: Aline August, MD 03/09/2021 7:26 AM  For on call review [email or web address].

## 2021-03-09 NOTE — Progress Notes (Signed)
ANTICOAGULATION CONSULT NOTE - Follow Up Consult  Pharmacy Consult for Heparin IV Indication: Hx DVT (01/2021), PTA apixaban on hold  No Known Allergies  Patient Measurements: Height: 5\' 9"  (175.3 cm) Weight: 136 kg (299 lb 14.6 oz) IBW/kg (Calculated) : 66.2 Heparin Dosing Weight: 98.6 kg  Vital Signs: Temp: 98.1 F (36.7 C) (02/24 0453) BP: 109/72 (02/24 0453) Pulse Rate: 82 (02/24 0453)  Labs: Recent Labs    03/08/21 1224 03/09/21 0054  HGB 12.9 11.8*  HCT 41.0 37.0  PLT 283 266  APTT 28  --   LABPROT 13.2  --   INR 1.0  --   HEPARINUNFRC <0.10* 0.27*  CREATININE 0.74 0.78    Estimated Creatinine Clearance: 140.3 mL/min (by C-G formula based on SCr of 0.78 mg/dL).   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 Pharmacy consulted for IV heparin dosing while Apixaban held.  Baseline CBC and SCr WNL.   Baseline  aPTT 28 seconds.  Baseline heparin level < 0.1 units/mL.  Heparin level would be falsely elevated with Apixaban use, but unexpectedly low.  Can adjust/titrate heparin infusion using heparin levels.   Today, 03/09/2021: Heparin level 0.49, therapeutic on heparin 1900 units/hr CBC: Hgb decreased to 11.8, Plt WNL SCr stable, CrCl > 100 ml/min No bleeding or complications reported.    Goal of Therapy:  Heparin level 0.3-0.7 units/ml aPTT 66-102 seconds Monitor platelets by anticoagulation protocol: Yes   Plan:  Continue heparin IV infusion at 1900  units/hr Heparin level in 6 hours to confirm.   Daily heparin level and CBC Surgery re-scheduled for 2/25 at 07:15.  Per MD, d/c Heparin 6 hours prior to procedure.  Heparin stop time set for 2/25 at 01:15.    3/25 PharmD, BCPS Clinical Pharmacist [facility] main pharmacy [phone] 03/09/2021 9:03 AM

## 2021-03-09 NOTE — Anesthesia Preprocedure Evaluation (Addendum)
Anesthesia Evaluation  Patient identified by MRN, date of birth, ID band Patient awake    Reviewed: Allergy & Precautions, NPO status , Patient's Chart, lab work & pertinent test results  Airway Mallampati: II       Dental no notable dental hx.    Pulmonary former smoker,    Pulmonary exam normal        Cardiovascular hypertension, Pt. on medications Normal cardiovascular exam     Neuro/Psych negative neurological ROS  negative psych ROS   GI/Hepatic negative GI ROS, Neg liver ROS,   Endo/Other  diabetes, Type 2, Insulin Dependent, Oral Hypoglycemic AgentsMorbid obesity  Renal/GU   negative genitourinary   Musculoskeletal   Abdominal (+) + obese,   Peds  Hematology negative hematology ROS (+)   Anesthesia Other Findings   Reproductive/Obstetrics                            Anesthesia Physical Anesthesia Plan  ASA: 3  Anesthesia Plan: General   Post-op Pain Management: Regional block*   Induction: Intravenous  PONV Risk Score and Plan: Ondansetron and Midazolam  Airway Management Planned: LMA  Additional Equipment: None  Intra-op Plan:   Post-operative Plan: Extubation in OR  Informed Consent: I have reviewed the patients History and Physical, chart, labs and discussed the procedure including the risks, benefits and alternatives for the proposed anesthesia with the patient or authorized representative who has indicated his/her understanding and acceptance.     Dental advisory given  Plan Discussed with: CRNA  Anesthesia Plan Comments:        Anesthesia Quick Evaluation

## 2021-03-09 NOTE — Consult Note (Signed)
[facility] for [facility]ious [facility]       Reason for Consult: osteomyelitis    Referring Physician: Dr. Starla Link  Active Problems:   Type 2 diabetes mellitus with hyperglycemia (HCC)   Class 3 obesity ([place])   Osteomyelitis of right foot (HCC)   HTN (hypertension)   Deep vein thrombosis (DVT) of right lower extremity (H[facility])   Hyponatremia    gabapentin  600 mg Oral TID   insulin aspart  0-20 Units Subcutaneous TID WC   insulin aspart  0-5 Units Subcutaneous QHS   insulin glargine-yfgn  40 Units Subcutaneous Daily   lisinopril  2.5 mg Oral Daily    Recommendations: Can continue with IV antibiotics for now Will monitor cultures over the weekend   Assessment: She has areas concerning for bone marrow edema at the site of the metatarsal stumps and plan for more debridement noted tomorrow.  Will review the findings and make recommendations after surgery and culture results.   Dr. Baxter Flattery on over the weekend if needed, otherwise will follow up on Monday  Antibiotics: Vancomycin, cefepime, metronidazole  HPI: [name] is a 41 y.o. female with a history of transmetatarsal amputation for osteomyelitis and wound dehiscence here with worsening wound.  Followed by Dr. Kathaleen Bury and seen in the office with some local debridement and sent in for further surgical management.  Started on broad spectrum antibiotics.  Plan for surgery Saturday.  No fever, no chills.    Review of Systems:  Constitutional: negative for fevers and chills Gastrointestinal: negative for nausea and diarrhea All other systems reviewed and are negative    Past Medical History:  Diagnosis Date   Class 3 obesity ([place]) 12/16/2020   Diabetes mellitus     Social History   Tobacco Use   Smoking status: Former    Types: Cigarettes  Scientific laboratory technician Use: Never used  Substance Use Topics   Alcohol use: Yes    Comment: occ   Drug use: Yes    Types: Marijuana    Family History  Problem Relation  Age of Onset   Hypertension Mother    Diabetes Mother    Diabetes Other     No Known Allergies  Physical Exam: Constitutional: in no apparent distress  Vitals:   03/09/21 0142 03/09/21 0453  BP: 120/79 109/72  Pulse: 77 82  Resp: 18 16  Temp:  98.1 F (36.7 C)  SpO2: 97% 100%   EYES: anicteric Respiratory: normal respiratory effort Musculoskeletal: foot wrapped Skin: no rashes  Lab Results  Component Value Date   WBC 9.0 03/09/2021   HGB 11.8 (L) 03/09/2021   HCT 37.0 03/09/2021   MCV 83.0 03/09/2021   PLT 266 03/09/2021    Lab Results  Component Value Date   CREATININE 0.78 03/09/2021   BUN 17 03/09/2021   NA 134 (L) 03/09/2021   K 3.9 03/09/2021   CL 102 03/09/2021   CO2 24 03/09/2021    Lab Results  Component Value Date   ALT 11 03/09/2021   AST 12 (L) 03/09/2021   ALKPHOS 64 03/09/2021     Microbiology: Recent Results (from the past 240 hour(s))  Blood culture (routine x 2)     Status: None (Preliminary result)   Collection Time: 03/08/21 12:23 PM   Specimen: BLOOD  Result Value Ref Range Status   Specimen Description   Final    BLOOD BLOOD RIGHT FOREARM Performed at [facility], [place]  [place], [place] [address]    Special Requests   Final    BOTTLES DRAWN AEROBIC AND ANAEROBIC Blood Culture results may not be optimal due to an inadequate volume of blood received in culture bottles Performed at [facility] [address] [place], [place] [address]    Culture   Final    NO GROWTH < 24 HOURS Performed at [facility] [address], [place], [place] [address]    Report Status PENDING  Incomplete  Resp Panel by RT-PCR (Flu A&B, Covid) Peripheral     Status: None   Collection Time: 03/08/21  3:11 PM   Specimen: Peripheral; Nasopharyngeal(NP) swabs in vial transport medium  Result Value Ref Range Status   SARS Coronavirus 2 by RT PCR NEGATIVE NEGATIVE Final    Comment: (NOTE) SARS-CoV-2  target nucleic acids are NOT DETECTED.  The SARS-CoV-2 RNA is generally detectable in upper respiratory specimens during the acute phase of infection. The lowest concentration of SARS-CoV-2 viral copies this assay can detect is 138 copies/mL. A negative result does not preclude SARS-Cov-2 infection and should not be used as the sole basis for treatment or other patient management decisions. A negative result may occur with  improper specimen collection/handling, submission of specimen other than nasopharyngeal swab, presence of viral mutation(s) within the areas targeted by this assay, and inadequate number of viral copies(<138 copies/mL). A negative result must be combined with clinical observations, patient history, and epidemiological information. The expected result is Negative.  Fact Sheet for Patients:  EntrepreneurPulse.com.au  Fact Sheet for Healthcare Providers:  IncredibleEmployment.be  This test is no t yet approved or cleared by the [place] FDA and  has been authorized for detection and/or diagnosis of SARS-CoV-2 by FDA under an Emergency Use Authorization (EUA). This EUA will remain  in effect (meaning this test can be used) for the duration of the COVID-19 declaration under Section 564(b)(1) of the Act, 21 U.S.C.section 360bbb-3(b)(1), unless the authorization is terminated  or revoked sooner.       Influenza A by PCR NEGATIVE NEGATIVE Final   Influenza B by PCR NEGATIVE NEGATIVE Final    Comment: (NOTE) The Xpert Xpress SARS-CoV-2/FLU/RSV plus assay is intended as an aid in the diagnosis of influenza from Nasopharyngeal swab specimens and should not be used as a sole basis for treatment. Nasal washings and aspirates are unacceptable for Xpert Xpress SARS-CoV-2/FLU/RSV testing.  Fact Sheet for Patients: EntrepreneurPulse.com.au  Fact Sheet for Healthcare  Providers: IncredibleEmployment.be  This test is not yet approved or cleared by the [place] FDA and has been authorized for detection and/or diagnosis of SARS-CoV-2 by FDA under an Emergency Use Authorization (EUA). This EUA will remain in effect (meaning this test can be used) for the duration of the COVID-19 declaration under Section 564(b)(1) of the Act, 21 U.S.C. section 360bbb-3(b)(1), unless the authorization is terminated or revoked.  Performed at [facility], [place] [address] [place], [place] [address]   Blood culture (routine x 2)     Status: None (Preliminary result)   Collection Time: 03/08/21  3:40 PM   Specimen: BLOOD  Result Value Ref Range Status   Specimen Description   Final    BLOOD RIGHT ANTECUBITAL Performed at [facility] [address] [place], [place] [address]    Special Requests   Final    BOTTLES DRAWN AEROBIC AND ANAEROBIC Blood Culture results may not be optimal due to an inadequate volume of blood received  in culture bottles Performed at [facility], [place] [address], [place], [place] [address]    Culture   Final    NO GROWTH < 12 HOURS Performed at [facility] [address] [place], [place] [address]    Report Status PENDING  Incomplete    Thayer Headings, [facility] for Infectious [facility] [place] [email or web address] 03/09/2021, 1:29 PM

## 2021-03-09 NOTE — Consult Note (Signed)
WOC Nurse Consult Note: Patient receiving care in [facility]. Consult completed remotely after review of record. Reason for Consult: right foot wound Wound type: surgical TMA with osteomyelitis, on the schedule by Orthopedics for surgical Dressing procedure/placement/frequency: I have placed a one time order for nursing to wrap the foot in kerlix. All questions about care of the foot post-surgery tomorrow should be directed to surgical services. WOC nurse will not follow at this time.  Please re-consult the WOC team if needed.  Helmut Muster, RN, MSN, CWOCN, CNS-BC, pager [phone]

## 2021-03-09 NOTE — Progress Notes (Signed)
Pharmacy Brief Note - Evening Anticoagulation Follow Up:  Pt is a 89 yoF currently on heparin drip while apixaban on hold for surgery. For full history, see note by Gretta Arab, PharmD from earlier today.   Assessment: Confirmatory HL = 0.44 remains therapeutic on heparin infusion of 1900 units/hr Confirmed with RN that heparin infusing at correct rate. No signs of bleeding. No issues with line.   Goal: HL 0.3 - 0.7  Plan: Continue heparin infusion at current rate of 1900 units/hr Heparin to stop @ 0115 on 2/25 prior to surgery. No additional levels ordered at this time. Follow up on post-op anticoagulation plan. Will need guidance from surgeon on when safe to resume anticoagulation.  Lenis Noon, PharmD 03/09/21 2:45 PM

## 2021-03-09 NOTE — Consult Note (Signed)
Patient ID: [name] MRN: [id number] DOB/AGE: 09/15/1980 41 y.o.  Admit date: 03/08/2021  Admission Diagnoses:  Active Problems:   Type 2 diabetes mellitus with hyperglycemia (HCC)   Class 3 obesity (HCC)   Osteomyelitis of right foot (HCC)   HTN (hypertension)   Deep vein thrombosis (DVT) of right lower extremity (HCC)   Hyponatremia   HPI: The patient is currently admitted to [facility]. She has right foot pain for the past year with acute worsening since late November 2022. She cannot recall specific injury. She is an uncontrolled diabetic with HgbA1c of 13.5. Her PMH also significant for BMI 45, HLD. She has noticed right 2nd toe blackish appearance in November and was urged to come to the ER by her mother. The patient underwent right TMA on 12/18/20 and did well from this. She was on antibiotics per ID. Her postop course was notable for extensive RLE DVT for which she received Eliquis. On follow up with her wound care team, she had new purulent drainage from the amputation stump site. A repeat MRI on 02/27/21 demonstrated new abscess just plantar to the midfoot communicating with the external environment without other collections or extensive osteomyelitis in the hindfoot. She denies fever/chills. She was admitted to the [facility] medicine team for workup and planned surgery on 03/10/21.   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   Procedure: TRANSMETATARSAL AMPUTATION;  Surgeon: Armond Hang, MD;  Location: [facility] ORS;  Service: Orthopedics;  Laterality: Right;    Family History: Family History  Problem Relation Age of Onset   Hypertension Mother    Diabetes Mother    Diabetes Other     Social History: Social History    Socioeconomic History   Marital status: Single    Spouse name: Not on file   Number of children: Not on file   Years of education: Not on file   Highest education level: Not on file  Occupational History   Not on file  Tobacco Use   Smoking status: Former    Types: Cigarettes   Smokeless tobacco: Not on file  Vaping Use   Vaping Use: Never used  Substance and Sexual Activity   Alcohol use: Yes    Comment: occ   Drug use: Yes    Types: Marijuana   Sexual activity: Not on file  Other Topics Concern   Not on file  Social History Narrative   Not on file   Social Determinants of Health   Financial Resource Strain: Not on file  Food Insecurity: Not on file  Transportation Needs: Not on file  Physical Activity: Not on file  Stress: Not on file  Social Connections: Not on file  Intimate Partner Violence: Not on file    Allergies: Patient has no known allergies.  Medications: I have reviewed the patient's current medications.  Vital Signs: Patient Vitals for the past 24 hrs:  BP Temp Temp src Pulse Resp SpO2 Weight  03/09/21 1356 116/76 97.8 F (36.6 C) Oral 86 16 100 % --  03/09/21 0500 -- -- -- -- -- -- 136 kg  03/09/21 0453 109/72 98.1 F (36.7 C) -- 82 16 100 % --  03/09/21 0142 120/79 -- -- 77 18 97 % --  03/08/21 2221 130/74 98.6 F (37 C) -- 92 18 99 % --    Radiology: MR FOOT RIGHT W WO CONTRAST  Result Date: 02/28/2021 CLINICAL DATA:  Prior amputation. History of diabetes. Evaluate for recurrent osteomyelitis. EXAM: MRI OF THE RIGHT FOREFOOT WITHOUT AND WITH CONTRAST TECHNIQUE: Multiplanar, multisequence MR imaging of the right forefoot was performed before and after the administration of intravenous contrast. CONTRAST:  36mL GADAVIST GADOBUTROL 1 MMOL/ML IV SOLN COMPARISON:  12/17/2020 FINDINGS: Bones/Joint/Cartilage Large soft tissue wound overlying the transmetatarsal amputation. Bone marrow edema within the metatarsal stumps. Mild marrow edema along  the distal dorsal plantar aspect of the medial cuneiform. Mild subchondral marrow edema along the distal dorsal middle cuneiform. Mild bone marrow edema along the distal plantar aspect of the cuboid. Severe surrounding soft tissue edema along the amputation site consistent with cellulitis. Complex fluid collection along the plantar medial aspect of the foot at the level of the TMT joints measuring 4.5 x 1 x 2.8 cm with thick peripheral enhancement and small amount air within the collection with a sinus tract extending to the skin surface. Small posterior subtalar joint effusion. No marrow signal abnormality. Ligaments Ligaments are unremarkable. Muscles and Tendons T2 hyperintense signal throughout the plantar musculature which may be neurogenic versus secondary to myositis. Soft tissue No other fluid collection or hematoma. No soft tissue mass. Soft tissue enhancement at the amputation site. Soft tissue edema and skin thickening involving the dorsal aspect of the foot and to lesser extent the plantar aspect. IMPRESSION: 1. Large soft tissue wound overlying the transmetatarsal amputation with surrounding cellulitis. Bone marrow edema within the metatarsal stumps concerning for residual osteomyelitis. Complex fluid collection along the plantar medial aspect of the foot at the level of the TMT joints measuring 4.5 x 1 x 2.8 cm with thick peripheral enhancement and small amount air within the collection with a sinus tract extending to the skin surface consistent with an abscess. 2. Mild marrow edema along the distal dorsal plantar aspect of the medial cuneiform. Mild bone marrow edema along the distal plantar aspect of the cuboid. This may reflect reactive marrow changes secondary to arthritis versus early osteomyelitis. Electronically Signed   By: Kathreen Devoid M.D.   On: 02/28/2021 07:20   DG Foot Complete Right  Result Date: 03/08/2021 CLINICAL DATA:  Patient arrived via wheelchair after seeing her PCP this  morning. DVT in right leg from knee to groin. Abscess on right foot. Amputation in December. EXAM: RIGHT FOOT COMPLETE - 3+ VIEW COMPARISON:  MRI examination dated February 27, 2021 FINDINGS: Status post prior amputation through the bases of the metatarsals. No cortical erosion or periosteal reaction concerning for osteomyelitis. Osteopenia. Marked subcutaneous soft tissue edema and swelling about the foot. IMPRESSION: 1. Status post amputation through the bases of the metatarsals without radiographic evidence of acute osteomyelitis. Evaluation early osteomyelitis is however limited on radiographs. 2. Marked skin thickening and subcutaneous soft tissue swelling about the foot consistent with cellulitis. Electronically Signed   By: Keane Police D.O.   On: 03/08/2021 15:18   VAS [place] LOWER EXTREMITY VENOUS (DVT)  Result Date: 03/08/2021  Lower Venous DVT Study Patient Name:  SILVANA MATHERN  Date of Exam:   03/08/2021 Medical Rec #: [id number]       Accession #:    [id number] Date of Birth: [birth year]       Patient Gender: F Patient Age:   47 years Exam Location:  [facility] Procedure:  VAS [place] LOWER EXTREMITY VENOUS (DVT) Referring Phys: Beverley Fiedler SOTO --------------------------------------------------------------------------------  Indications: Follow up right lower extremity DVT.  Comparison Study: 01/30/2021- extensive right lower extremity DVT Performing Technologist: Maudry Mayhew MHA, RDMS, RVT, RDCS  Examination Guidelines: A complete evaluation includes B-mode imaging, spectral Doppler, color Doppler, and power Doppler as needed of all accessible portions of each vessel. Bilateral testing is considered an integral part of a complete examination. Limited examinations for reoccurring indications may be performed as noted. The reflux portion of the exam is performed with the patient in reverse Trendelenburg.  +---------+---------------+---------+-----------+----------+--------------+  RIGHT      Compressibility Phasicity Spontaneity Properties Thrombus Aging  +---------+---------------+---------+-----------+----------+--------------+  CFV       Full            Yes       Yes         patent                     +---------+---------------+---------+-----------+----------+--------------+  SFJ       Full                                                             +---------+---------------+---------+-----------+----------+--------------+  FV Prox   Full                      Yes         patent                     +---------+---------------+---------+-----------+----------+--------------+  FV Mid    Full                      Yes         patent                     +---------+---------------+---------+-----------+----------+--------------+  FV Distal Full                      Yes         patent                     +---------+---------------+---------+-----------+----------+--------------+  POP       Full            Yes       Yes         patent                     +---------+---------------+---------+-----------+----------+--------------+  PTV       Full                      Yes         patent                     +---------+---------------+---------+-----------+----------+--------------+  PERO      Full                      Yes         patent                     +---------+---------------+---------+-----------+----------+--------------+   Right Technical Findings: Not visualized  segments include PFV, limited visualization PTV peroneal veins.  +----+---------------+---------+-----------+----------+--------------+  LEFT Compressibility Phasicity Spontaneity Properties Thrombus Aging  +----+---------------+---------+-----------+----------+--------------+  CFV  Full            Yes       Yes                                    +----+---------------+---------+-----------+----------+--------------+     Summary: RIGHT: - There is no obvious evidence of acute deep vein thrombosis in the lower extremity. Findings improved when  compared to prior study.  - No cystic structure found in the popliteal fossa.  LEFT: - No evidence of common femoral vein obstruction.  *See table(s) above for measurements and observations. Electronically signed by Orlie Pollen on 03/08/2021 at 6:11:18 PM.    Final     Labs: Recent Labs    03/08/21 1224 03/09/21 0054  WBC 8.3 9.0  RBC 4.97 4.46  HCT 41.0 37.0  PLT 283 266   Recent Labs    03/08/21 1224 03/09/21 0054  NA 135 134*  K 4.4 3.9  CL 102 102  CO2 24 24  BUN 15 17  CREATININE 0.74 0.78  GLUCOSE 176* 174*  CALCIUM 9.5 8.9   Recent Labs    03/08/21 1224  INR 1.0    Review of Systems: ROS as detailed in HPI  Physical Exam: Body mass index is 44.29 kg/m.  Physical Exam   Gen: AAOx3, NAD Comfortable at rest  Right Lower Extremity: Skin intact, healing granulation tissue over TMA stump with medial area of dehiscence with purulent drainage No TTP HF/KE/KF/ADF/AP 5/5 SILT throughout DP, PT 2+ to palp CR < 2s   Assessment and Plan: 41 yr old F poorly controlled diabetic with 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 with recurrent abscess in plantar foot  -reviewed case and imaging in detail with patient and her mother -plan for right foot I&D with possible TMA revision on 03/10/21 at 0715 -we will plan for prolonged packing by wound care following surgery to allow for granulation tissue formation -antibiotics per ID -plan for holding heparin infusion 6 hours prior to surgery -we discussed bedside vs OR debridement - due to her therapeutic anticoagulation and need for thorough debridement, we will plan to do this in the OR. We discussed elevated risks of procedure due to her anticoagulated state -we discussed that if this attempt at foot salvage should fail, we will have to discuss more proximal amputation at a later date, likely in the form of a right BKA -NPO from midnight, insulin management per primary team  Armond Hang,  MD Orthopaedic Surgeon [facility] [phone]  The risks and benefits were presented and reviewed. The risks due to recurrent/new/persistent infection, stiffness, nerve/vessel/tendon injury, nonunion/malunion, wound healing issues, development of arthritis, failure of this surgery, possibility of delayed definitive surgery, need for further surgery including skin graft/flap, thromboembolic events, anesthesia/medical complications, further amputation, death among others were discussed. The patient and her mother both acknowledged the explanation and agreed to proceed with the plan.

## 2021-03-10 ENCOUNTER — Inpatient Hospital Stay (HOSPITAL_COMMUNITY): Payer: Self-pay | Admitting: Certified Registered Nurse Anesthetist

## 2021-03-10 ENCOUNTER — Inpatient Hospital Stay (HOSPITAL_COMMUNITY): Payer: Medicaid Other | Admitting: Certified Registered Nurse Anesthetist

## 2021-03-10 ENCOUNTER — Encounter (HOSPITAL_COMMUNITY)
Admission: EM | Disposition: A | Discharge status: Home Health | Payer: Self-pay | Source: Home / Self Care | Attending: Internal Medicine

## 2021-03-10 ENCOUNTER — Other Ambulatory Visit: Payer: Self-pay

## 2021-03-10 DIAGNOSIS — E871 Hypo-osmolality and hyponatremia: Secondary | ICD-10-CM | POA: Diagnosis not present

## 2021-03-10 DIAGNOSIS — M869 Osteomyelitis, unspecified: Secondary | ICD-10-CM | POA: Diagnosis not present

## 2021-03-10 DIAGNOSIS — Z794 Long term (current) use of insulin: Secondary | ICD-10-CM

## 2021-03-10 DIAGNOSIS — M86171 Other acute osteomyelitis, right ankle and foot: Secondary | ICD-10-CM

## 2021-03-10 DIAGNOSIS — I825Z1 Chronic embolism and thrombosis of unspecified deep veins of right distal lower extremity: Secondary | ICD-10-CM | POA: Diagnosis not present

## 2021-03-10 DIAGNOSIS — E1169 Type 2 diabetes mellitus with other specified complication: Secondary | ICD-10-CM | POA: Diagnosis not present

## 2021-03-10 DIAGNOSIS — E119 Type 2 diabetes mellitus without complications: Secondary | ICD-10-CM | POA: Diagnosis not present

## 2021-03-10 DIAGNOSIS — T8789 Other complications of amputation stump: Secondary | ICD-10-CM

## 2021-03-10 DIAGNOSIS — Z7984 Long term (current) use of oral hypoglycemic drugs: Secondary | ICD-10-CM

## 2021-03-10 DIAGNOSIS — E1165 Type 2 diabetes mellitus with hyperglycemia: Secondary | ICD-10-CM | POA: Diagnosis not present

## 2021-03-10 HISTORY — PX: MINOR IRRIGATION AND DEBRIDEMENT OF WOUND: SHX6239

## 2021-03-10 LAB — CBC
HCT: 38.2 % (ref 36.0–46.0)
Hemoglobin: 11.8 g/dL — ABNORMAL LOW (ref 12.0–15.0)
MCH: 26 pg (ref 26.0–34.0)
MCHC: 30.9 g/dL (ref 30.0–36.0)
MCV: 84.3 fL (ref 80.0–100.0)
Platelets: 263 10*3/uL (ref 150–400)
RBC: 4.53 MIL/uL (ref 3.87–5.11)
RDW: 15.1 % (ref 11.5–15.5)
WBC: 6.4 10*3/uL (ref 4.0–10.5)
nRBC: 0 % (ref 0.0–0.2)

## 2021-03-10 LAB — BASIC METABOLIC PANEL
Anion gap: 6 (ref 5–15)
BUN: 15 mg/dL (ref 6–20)
CO2: 23 mmol/L (ref 22–32)
Calcium: 8.4 mg/dL — ABNORMAL LOW (ref 8.9–10.3)
Chloride: 99 mmol/L (ref 98–111)
Creatinine, Ser: 0.79 mg/dL (ref 0.44–1.00)
GFR, Estimated: >60 mL/min (ref >60)
Glucose, Bld: 350 mg/dL — ABNORMAL HIGH (ref 70–99)
Potassium: 4.4 mmol/L (ref 3.5–5.1)
Sodium: 128 mmol/L — ABNORMAL LOW (ref 135–145)

## 2021-03-10 LAB — GLUCOSE, CAPILLARY
Glucose-Capillary: 250 mg/dL — ABNORMAL HIGH (ref 70–99)
Glucose-Capillary: 323 mg/dL — ABNORMAL HIGH (ref 70–99)
Glucose-Capillary: 464 mg/dL — ABNORMAL HIGH (ref 70–99)
Glucose-Capillary: 437 mg/dL — ABNORMAL HIGH (ref 70–99)

## 2021-03-10 LAB — MAGNESIUM: Magnesium: 2 mg/dL (ref 1.7–2.4)

## 2021-03-10 LAB — SURGICAL PCR SCREEN
MRSA, PCR: NEGATIVE
Staphylococcus aureus: NEGATIVE

## 2021-03-10 LAB — GLUCOSE, RANDOM: Glucose, Bld: 463 mg/dL — ABNORMAL HIGH (ref 70–99)

## 2021-03-10 SURGERY — MINOR IRRIGATION AND DEBRIDEMENT OF WOUND
Anesthesia: Monitor Anesthesia Care | Site: Foot | Laterality: Right

## 2021-03-10 MED ORDER — APIXABAN 5 MG PO TABS
5.0000 mg | ORAL_TABLET | Freq: Two times a day (BID) | ORAL | Status: DC
  Administered 2021-03-10 – 2021-03-14 (×8): 5 mg via ORAL
  Filled 2021-03-10 (×8): qty 1

## 2021-03-10 MED ORDER — DEXAMETHASONE SODIUM PHOSPHATE 10 MG/ML IJ SOLN
INTRAMUSCULAR | Status: DC | PRN
  Administered 2021-03-10: 4 mg via INTRAVENOUS

## 2021-03-10 MED ORDER — PROPOFOL 10 MG/ML IV BOLUS
INTRAVENOUS | Status: DC | PRN
  Administered 2021-03-10: 100 mg via INTRAVENOUS

## 2021-03-10 MED ORDER — INSULIN ASPART 100 UNIT/ML IJ SOLN
1.0000 [IU] | Freq: Once | INTRAMUSCULAR | Status: AC
  Administered 2021-03-10: 1 [IU] via SUBCUTANEOUS

## 2021-03-10 MED ORDER — FENTANYL CITRATE (PF) 100 MCG/2ML IJ SOLN
INTRAMUSCULAR | Status: DC | PRN
  Administered 2021-03-10: 100 ug via INTRAVENOUS

## 2021-03-10 MED ORDER — SODIUM CHLORIDE 0.9 % IR SOLN
Status: DC | PRN
  Administered 2021-03-10: 6000 mL

## 2021-03-10 MED ORDER — MEPERIDINE HCL 50 MG/ML IJ SOLN: 6.2500 mg | INTRAMUSCULAR | Status: DC | PRN

## 2021-03-10 MED ORDER — VANCOMYCIN HCL IN DEXTROSE 1-5 GM/200ML-% IV SOLN
INTRAVENOUS | Status: AC
  Filled 2021-03-10: qty 200

## 2021-03-10 MED ORDER — CLONIDINE HCL (ANALGESIA) 100 MCG/ML EP SOLN
EPIDURAL | Status: DC | PRN
  Administered 2021-03-10: 100 ug

## 2021-03-10 MED ORDER — ONDANSETRON HCL 4 MG/2ML IJ SOLN: 4.0000 mg | Freq: Once | INTRAMUSCULAR | Status: DC | PRN

## 2021-03-10 MED ORDER — INSULIN ASPART 100 UNIT/ML IJ SOLN
22.0000 [IU] | Freq: Once | INTRAMUSCULAR | Status: AC
  Administered 2021-03-10: 22 [IU] via SUBCUTANEOUS

## 2021-03-10 MED ORDER — CIPROFLOXACIN IN D5W 400 MG/200ML IV SOLN
INTRAVENOUS | Status: AC
  Filled 2021-03-10: qty 200

## 2021-03-10 MED ORDER — HYDROMORPHONE HCL 1 MG/ML IJ SOLN: 0.2500 mg | INTRAMUSCULAR | Status: DC | PRN

## 2021-03-10 MED ORDER — POVIDONE-IODINE 10 % EX SWAB: 2.0000 "application " | Freq: Once | CUTANEOUS | Status: DC

## 2021-03-10 MED ORDER — SODIUM CHLORIDE 0.9 % IV SOLN
INTRAVENOUS | Status: DC
Start: 2021-03-10 — End: 2021-03-10

## 2021-03-10 MED ORDER — PROPOFOL 1000 MG/100ML IV EMUL
INTRAVENOUS | Status: AC
  Filled 2021-03-10: qty 100

## 2021-03-10 MED ORDER — CIPROFLOXACIN IN D5W 400 MG/200ML IV SOLN
400.0000 mg | Freq: Once | INTRAVENOUS | Status: AC
  Administered 2021-03-10: 400 mg via INTRAVENOUS

## 2021-03-10 MED ORDER — MIDAZOLAM HCL 2 MG/2ML IJ SOLN
INTRAMUSCULAR | Status: AC
  Filled 2021-03-10: qty 2

## 2021-03-10 MED ORDER — LACTATED RINGERS IV SOLN: INTRAVENOUS | Status: DC | PRN

## 2021-03-10 MED ORDER — LIDOCAINE 2% (20 MG/ML) 5 ML SYRINGE
INTRAMUSCULAR | Status: DC | PRN
Start: 2021-03-10 — End: 2021-03-10
  Administered 2021-03-10: 100 mg via INTRAVENOUS

## 2021-03-10 MED ORDER — FENTANYL CITRATE (PF) 100 MCG/2ML IJ SOLN
INTRAMUSCULAR | Status: AC
  Filled 2021-03-10: qty 2

## 2021-03-10 MED ORDER — 0.9 % SODIUM CHLORIDE (POUR BTL) OPTIME
TOPICAL | Status: DC | PRN
  Administered 2021-03-10: 1000 mL

## 2021-03-10 MED ORDER — VANCOMYCIN HCL 1000 MG IV SOLR
INTRAVENOUS | Status: AC
  Filled 2021-03-10: qty 20

## 2021-03-10 MED ORDER — PHENYLEPHRINE 40 MCG/ML (10ML) SYRINGE FOR IV PUSH (FOR BLOOD PRESSURE SUPPORT)
PREFILLED_SYRINGE | INTRAVENOUS | Status: DC | PRN
  Administered 2021-03-10 (×2): 160 ug via INTRAVENOUS

## 2021-03-10 MED ORDER — MIDAZOLAM HCL 5 MG/5ML IJ SOLN
INTRAMUSCULAR | Status: DC | PRN
  Administered 2021-03-10: 2 mg via INTRAVENOUS

## 2021-03-10 MED ORDER — ACETAMINOPHEN 10 MG/ML IV SOLN: 1000.0000 mg | Freq: Once | INTRAVENOUS | Status: DC | PRN

## 2021-03-10 MED ORDER — ONDANSETRON HCL 4 MG/2ML IJ SOLN
INTRAMUSCULAR | Status: DC | PRN
  Administered 2021-03-10: 4 mg via INTRAVENOUS

## 2021-03-10 MED ORDER — VANCOMYCIN HCL 1500 MG/300ML IV SOLN
1500.0000 mg | Freq: Once | INTRAVENOUS | Status: AC
  Administered 2021-03-10: 1000 mg via INTRAVENOUS
  Filled 2021-03-10: qty 300

## 2021-03-10 MED ORDER — ROPIVACAINE HCL 7.5 MG/ML IJ SOLN
INTRAMUSCULAR | Status: DC | PRN
  Administered 2021-03-10 (×4): 5 mL via PERINEURAL

## 2021-03-10 SURGICAL SUPPLY — 2 items
BNDG GAUZE ELAST 4 BULKY (GAUZE/BANDAGES/DRESSINGS) ×2 IMPLANT
GAUZE SPONGE 4X4 12PLY STRL (GAUZE/BANDAGES/DRESSINGS) ×2 IMPLANT

## 2021-03-10 NOTE — Anesthesia Postprocedure Evaluation (Signed)
Anesthesia Post Note  Patient: [name]  Procedure(s) Performed: MINOR IRRIGATION AND DEBRIDEMENT OF WOUND (Right: Foot)     Patient location during evaluation: PACU Anesthesia Type: General Level of consciousness: sedated Pain management: pain level controlled Vital Signs Assessment: post-procedure vital signs reviewed and stable Respiratory status: spontaneous breathing Cardiovascular status: stable Postop Assessment: no apparent nausea or vomiting Anesthetic complications: no   No notable events documented.  Last Vitals:  Vitals:   03/10/21 0900 03/10/21 0915  BP: 114/74 100/73  Pulse: 72 67  Resp: 14 13  Temp:    SpO2: 98% 100%    Last Pain:  Vitals:   03/10/21 0915  TempSrc:   PainSc: 0-No pain          RLE Sensation: No pain (03/10/21 0916)      Caren Macadam

## 2021-03-10 NOTE — Progress Notes (Addendum)
°    [place] for Infectious Disease    Date of Admission:  03/08/2021   Total days of antibiotics 3/cefepime-metronidazole-vanco          ID: [name] is a 41 y.o. female with poorly controlled DM with right foot DFU/OM s/p TM amputation with wound dehiscence, purulent drainage from aamputation stump site. Hx of group b strep and prevotella. Repeat mri showing extensive OM in the hindfoot and new abscess just plantar to midfoot Active Problems:   Type 2 diabetes mellitus with hyperglycemia (HCC)   Class 3 obesity (HCC)   Osteomyelitis of right foot (HCC)   HTN (hypertension)   Deep vein thrombosis (DVT) of right lower extremity (HCC)   Hyponatremia    Subjective: Afebrile and underwent I x D of right foot TM amputation with deep debridement and wound packing.  Medications:   apixaban  5 mg Oral BID   gabapentin  600 mg Oral TID   insulin aspart  0-20 Units Subcutaneous TID WC   insulin aspart  0-5 Units Subcutaneous QHS   insulin glargine-yfgn  40 Units Subcutaneous Daily   lisinopril  2.5 mg Oral Daily    Objective: Vital signs in last 24 hours: Temp:  [97.3 F (36.3 C)-98.7 F (37.1 C)] 98.2 F (36.8 C) (02/25 1442) Pulse Rate:  [67-96] 96 (02/25 1442) Resp:  [12-19] 16 (02/25 1442) BP: (100-114)/(54-85) 112/72 (02/25 1442) SpO2:  [96 %-100 %] 100 % (02/25 1442) Physical Exam  Constitutional:  oriented to person, place, and time. appears well-developed and well-nourished. No distress.  HENT: [place]/AT, PERRLA, no scleral icterus Mouth/Throat: Oropharynx is clear and moist. No oropharyngeal exudate.  Cardiovascular: Normal rate, regular rhythm and normal heart sounds. Exam reveals no gallop and no friction rub.  No murmur heard.  Pulmonary/Chest: Effort normal and breath sounds normal. No respiratory distress.  has no wheezes.  Neck = supple, no nuchal rigidity Abdominal: Soft. Bowel sounds are normal.  exhibits no distension. There is no tenderness.  [id number]  foot wrapped Neurological: alert and oriented to person, place, and time.  Skin: Skin is warm and dry. No rash noted. No erythema.  Psychiatric: a normal mood and affect.  behavior is normal.    Lab Results Recent Labs    03/09/21 0054 03/10/21 0309  WBC 9.0 6.4  HGB 11.8* 11.8*  HCT 37.0 38.2  NA 134* 128*  K 3.9 4.4  CL 102 99  CO2 24 23  BUN 17 15  CREATININE 0.78 0.79   Liver Panel Recent Labs    03/08/21 1224 03/09/21 0054  PROT 7.8 6.9  ALBUMIN 3.6 3.2*  AST 17 12*  ALT 14 11  ALKPHOS 69 64  BILITOT 0.5 0.3   Sedimentation Rate No results for input(s): ESRSEDRATE in the last 72 hours. C-Reactive Protein Recent Labs    03/09/21 0054  CRP 4.4*    Microbiology: reviewed Studies/Results: No results found.   Assessment/Plan: Will follow up cultures from the OR today, continue on current regimen of IV vancomycin, cefepime and metronidazole. Leaning towards her having her do IV abtx for 2 weeks before transitioning to orals. Follow recs for NWB and wound care.  Need to ensure better DM management for wound healing  [facility] Pager: [phone]  03/10/2021, 5:08 PM

## 2021-03-10 NOTE — Op Note (Signed)
03/10/2021  8:53 AM   PATIENT: [name]  41 y.o. female  MRN: [id number]   PRE-OPERATIVE DIAGNOSIS:   Right foot deep abscess s/p TMA   POST-OPERATIVE DIAGNOSIS:   Same   PROCEDURE: Incision and drainage of right foot transmetatarsal amputation with deep debridement and wound packing   SURGEON:  Armond Hang, MD   ASSISTANT: None   ANESTHESIA: General, regional   EBL: Minimal   TOURNIQUET: None used   COMPLICATIONS: None apparent   DISPOSITION: Extubated, awake and stable to recovery.   INDICATION FOR PROCEDURE: The patient is currently admitted to [facility]. She has right foot pain for the past year with acute worsening since late November 2022. She cannot recall specific injury. She is an uncontrolled diabetic with HgbA1c of 13.5. Her PMH also significant for BMI 45, HLD. She has noticed right 2nd toe blackish appearance in November and was urged to come to the ER by her mother. The patient underwent right TMA on 12/18/20 and did well from this. She was on antibiotics per ID. Her postop course was notable for extensive RLE DVT for which she received Eliquis. On follow up with her wound care team, she had new purulent drainage from the amputation stump site. A repeat MRI on 02/27/21 demonstrated new abscess just plantar to the midfoot communicating with the external environment without other collections or extensive osteomyelitis in the hindfoot. She denies fever/chills. She was admitted to the [facility] medicine team for workup and planned surgery on 03/10/21.   We discussed the diagnosis, alternative treatment options, risks and benefits of the above surgical intervention, as well as alternative non-operative treatments. All questions/concerns were addressed and the patient/family demonstrated appropriate understanding of the diagnosis, the procedure, the postoperative course, and overall prognosis. The patient wished to proceed with surgical  intervention and signed an informed surgical consent as such, in each others presence prior to surgery.   PROCEDURE IN DETAIL: After preoperative consent was obtained and the correct operative site was identified, the patient was brought to the operating room supine on stretcher and transferred onto operating table. General anesthesia was induced. Preoperative antibiotics were administered. Surgical timeout was taken. The patient was then positioned supine. The operative lower extremity was prepped and draped in standard sterile fashion with a tourniquet around the thigh. No tourniquet was used.  We began by taking culture swabs of the deep tissues with frank purulence. These were sent for aerobic and anaerobic cultures. We then used a combination of curettes and rongeurs to debride the wound. A large amount of necrotic muscle was encountered and this was sharply excised and sent for microbiology and pathology analysis.  The wound was then thoroughly irrigated with 6 liters of normal saline with continuous thorough debridement. It was noted that the cavity was likely a well contained space due to immediate return of all fluid that was used to flush the wound. Healthy bleeding was noted in the amputation stump. No frank bleeding was encountered. We then instilled povidone iodine solution in the wound for 5 minutes. Following this, we placed vancomycin powder deep within the wound. Finally, we applied iodoform gauze to pack the deep tunneling within the amputation stump site.    The leg was cleaned with saline and sterile dressings with gauze were loosely applied. The patient was awakened from anesthesia and transported to the recovery room in stable condition.    FOLLOW UP PLAN: -transfer to PACU, then return to [facility] under hospitalist service -strict NWB  operative extremity, maximum elevation -wound care consult for daily iodoform wound packing due to deep tunneling -pain meds, antibiotics and  anticoagulation per primary team (ok to resume therapeutic anticoagulation from Ortho standpoint at any time) -appreciate primary team care and ID recs -follow up as outpatient with Wound Care team -follow up as outpatient with Ortho in 3 weeks for wound check   [facility]

## 2021-03-10 NOTE — Progress Notes (Signed)
ANTICOAGULATION CONSULT NOTE - Follow Up Consult  Pharmacy Consult for apixaban Indication: Hx DVT (01/2021)  No Known Allergies  Patient Measurements: Height: 5\' 9"  (175.3 cm) Weight: 136 kg (299 lb 14.6 oz) IBW/kg (Calculated) : 66.2 Heparin Dosing Weight: 98.6 kg  Vital Signs: Temp: 97.8 F (36.6 C) (02/25 0943) Temp Source: Oral (02/25 0943) BP: 112/74 (02/25 0943) Pulse Rate: 79 (02/25 0943)  Labs: Recent Labs    03/08/21 1224 03/09/21 0054 03/09/21 0922 03/09/21 1611 03/10/21 0309  HGB 12.9 11.8*  --   --  11.8*  HCT 41.0 37.0  --   --  38.2  PLT 283 266  --   --  263  APTT 28  --   --   --   --   LABPROT 13.2  --   --   --   --   INR 1.0  --   --   --   --   HEPARINUNFRC <0.10* 0.27* 0.49 0.44  --   CREATININE 0.74 0.78  --   --  0.79     Estimated Creatinine Clearance: 140.3 mL/min (by C-G formula based on SCr of 0.79 mg/dL).   Medications:   gabapentin  600 mg Oral TID   insulin aspart  0-20 Units Subcutaneous TID WC   insulin aspart  0-5 Units Subcutaneous QHS   insulin glargine-yfgn  40 Units Subcutaneous Daily   lisinopril  2.5 mg Oral Daily  Infusions:   sodium chloride     ceFEPime (MAXIPIME) IV 2 g (03/10/21 0542)   metronidazole 500 mg (03/10/21 0352)   vancomycin     vancomycin     vancomycin 1,500 mg (03/10/21 0359)    Assessment: 89 y/oF with PMH of right foot osteomyelitis s/p right transmetatarsal amputation with subsequent wound dehiscence, RLE DVT diagnosed in January 2023 currently on Apixaban, DM2, HTN, morbid obesity who was seen by orthopedics in the office today for possible wound infection and was referred to the hospital for need for IV antibiotics and possible surgery. Pharmacy consulted for IV heparin dosing while Apixaban held.  Baseline  aPTT 28 seconds.  Baseline heparin level < 0.1 units/mL.  Heparin level would be falsely elevated with Apixaban use, but unexpectedly low.  Today, 03/10/2021: Heparin was held 2/25 preop,  pharmacy is now consulted to resume apixaban on POD0 CBC: Hgb low/stable at 11.8, Plt WNL SCr stable, CrCl > 100 ml/min No major bleeding or complications reported.    Goal of Therapy:  Monitor platelets by anticoagulation protocol: Yes   Plan:  Resume Apixaban 5mg  PO BID Timing:  Ortho OK for full dose anticoagulation.  D/w TRH, Dr. Starla Link, who recommends to start tonight at 22:00.   Pharmacy will sign off notes, but continue to monitor peripherally for dosage adjustments as needed.   Gretta Arab PharmD, BCPS Clinical Pharmacist [facility] main pharmacy [phone] 03/10/2021 10:09 AM

## 2021-03-10 NOTE — Plan of Care (Signed)

## 2021-03-10 NOTE — Brief Op Note (Signed)
03/10/2021  8:52 AM  PATIENT:  [name]  41 y.o. female  PRE-OPERATIVE DIAGNOSIS:  INFECTED RIGHT FOOT  POST-OPERATIVE DIAGNOSIS:  INFECTED RIGHT FOOT  PROCEDURE: Incision and drainage of right foot transmetatarsal amputation with deep debridement and wound packing  SURGEON:  Surgeon(s) and Role:    Netta Cedars, MD - Primary  PHYSICIAN ASSISTANT: None  ASSISTANTS: none   ANESTHESIA:   regional and general  EBL:  0 mL   BLOOD ADMINISTERED:none  DRAINS: none   LOCAL MEDICATIONS USED:  OTHER Vancomycin powder  SPECIMEN:  Source of Specimen:  right foot deep tissue for micro and path  DISPOSITION OF SPECIMEN:   Micro and Path  COUNTS:  YES  TOURNIQUET:  None used  DICTATION: .Note written in EPIC  PLAN OF CARE:  Return to inpatient  PATIENT DISPOSITION:  PACU - hemodynamically stable.   Delay start of Pharmacological VTE agent (>24hrs) due to surgical blood loss or risk of bleeding: no

## 2021-03-10 NOTE — Anesthesia Procedure Notes (Signed)
Procedure Name: LMA Insertion Date/Time: 03/10/2021 8:02 AM Performed by: Sudie Grumbling, CRNA Pre-anesthesia Checklist: Patient identified, Emergency Drugs available, Suction available and Patient being monitored Patient Re-evaluated:Patient Re-evaluated prior to induction Oxygen Delivery Method: Circle system utilized Preoxygenation: Pre-oxygenation with 100% oxygen Induction Type: IV induction Ventilation: Mask ventilation without difficulty LMA: LMA inserted LMA Size: 4.0 Tube type: Oral Number of attempts: 1 Placement Confirmation: positive ETCO2 and breath sounds checked- equal and bilateral Tube secured with: Tape Dental Injury: Teeth and Oropharynx as per pre-operative assessment

## 2021-03-10 NOTE — Anesthesia Procedure Notes (Signed)
Anesthesia Regional Block: Popliteal block   Pre-Anesthetic Checklist: , timeout performed,  Correct Patient, Correct Site, Correct Laterality,  Correct Procedure, Correct Position, site marked,  Risks and benefits discussed,  Surgical consent,  Pre-op evaluation,  At surgeon's request and post-op pain management  Laterality: Lower and Right  Prep: chloraprep       Needles:  Injection technique: Single-shot  Needle Type: Echogenic Stimulator Needle     Needle Length: 9cm  Needle Gauge: 20   Needle insertion depth: 2 cm   Additional Needles:   Procedures:,,,, ultrasound used (permanent image in chart),,    Narrative:  Start time: 03/10/2021 7:38 AM End time: 03/10/2021 7:45 AM Injection made incrementally with aspirations every 5 mL.  Performed by: Personally  Anesthesiologist: Leilani Able, MD

## 2021-03-10 NOTE — Transfer of Care (Signed)
Immediate Anesthesia Transfer of Care Note  Patient: [name]  Procedure(s) Performed: MINOR IRRIGATION AND DEBRIDEMENT OF WOUND (Right: Foot)  Patient Location: PACU  Anesthesia Type:General  Level of Consciousness: awake, alert  and oriented  Airway & Oxygen Therapy: Patient Spontanous Breathing and Patient connected to face mask  Post-op Assessment: Report given to RN and Post -op Vital signs reviewed and stable  Post vital signs: Reviewed and stable  Last Vitals:  Vitals Value Taken Time  BP    Temp    Pulse    Resp    SpO2      Last Pain:  Vitals:   03/10/21 0553  TempSrc: Oral  PainSc:       Patients Stated Pain Goal: 2 ([id number])  Complications: No notable events documented.

## 2021-03-10 NOTE — Progress Notes (Addendum)
Subjective: Day of Surgery Procedure(s) (LRB): MINOR IRRIGATION AND DEBRIDEMENT OF WOUND (Right)  Patient reports no pain. She has continued purulent drainage from the right foot TMA stump. She has remained stable on RNF and looking forward to surgery today. Heparin infusion was held overnight preoperatively.  Objective:   VITALS:  Temp:  [97.8 F (36.6 C)-98.7 F (37.1 C)] 98.3 F (36.8 C) (02/25 0849) Pulse Rate:  [72-86] 72 (02/25 0900) Resp:  [14-19] 14 (02/25 0900) BP: (101-116)/(59-85) 114/74 (02/25 0900) SpO2:  [97 %-100 %] 98 % (02/25 0900)  Gen: AAOx3, NAD Comfortable at rest   Right Lower Extremity: Skin intact, healing granulation tissue over TMA stump with medial area of dehiscence with purulent drainage No TTP HF/KE/KF/ADF/AP 5/5 SILT throughout DP, PT 2+ to palp CR < 2s   LABS Recent Labs    03/08/21 1224 03/09/21 0054 03/10/21 0309  HGB 12.9 11.8* 11.8*  WBC 8.3 9.0 6.4  PLT 283 266 263   Recent Labs    03/09/21 0054 03/10/21 0309  NA 134* 128*  K 3.9 4.4  CL 102 99  CO2 24 23  BUN 17 15  CREATININE 0.78 0.79  GLUCOSE 174* 350*   Recent Labs    03/08/21 1224  INR 1.0     Assessment/Plan: 41 yr old F poorly controlled diabetic with 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 with recurrent abscess in plantar foot   -reviewed case and imaging in detail with patient and her mother -plan for right foot I&D with possible TMA revision today 03/10/21 at 0715 -we will plan for prolonged packing by wound care following surgery to allow for granulation tissue formation -antibiotics per ID -heparin infusion held 6 hours prior to surgery -we discussed that if this attempt at foot salvage should fail, we will have to discuss more proximal amputation at a later date, likely in the form of a right BKA -NPO since midnight, insulin management per primary team  [name] 03/10/2021, 9:09 AM

## 2021-03-10 NOTE — Plan of Care (Signed)
  Problem: Safety: Goal: Ability to remain free from injury will improve Outcome: Progressing   Problem: Pain Managment: Goal: General experience of comfort will improve Outcome: Progressing   

## 2021-03-10 NOTE — H&P (Signed)
H&P Update:  -History and Physical Reviewed  -Patient has been re-examined  -No change in the plan of care  -The risks and benefits were presented and reviewed. The risks due to recurrent/new/persistent infection, stiffness, nerve/vessel/tendon injury, nonunion/malunion, wound healing issues, development of arthritis, failure of this surgery, possibility of delayed definitive surgery, need for further surgery including skin graft/flap, thromboembolic events, anesthesia/medical complications, further amputation, death among others were discussed. The patient and her mother both acknowledged the explanation and agreed to proceed with the plan.  Netta Cedars

## 2021-03-10 NOTE — Progress Notes (Signed)
°  Progress Note   Patient: [name] [id number] DOB: 14-Jul-1980 DOA: 03/08/2021     2 DOS: the patient was seen and examined on 03/10/2021   Brief hospital course:  41 y.o. 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  Patient had recent MRI of right foot on 02/28/2021 which had some cellulitis/residual osteomyelitis and possible abscess.  She was started on broad-spectrum antibiotics.  ID was also consulted.  Assessment and Plan: Osteomyelitis of right foot ([place])- (present on admission) -Continue broad-spectrum antibiotics.  ID following.  -For orthopedic intervention today.  Follow her cultures. -continue pain management.  Deep vein thrombosis (DVT) of right lower extremity ([place]) - Patient was found to have right lower extremity DVT in January 2023 and has been currently on Eliquis. -Heparin drip will be held prior to procedure and will be resumed once cleared by orthopedics.  Eliquis on hold for now.  Type 2 diabetes mellitus with hyperglycemia ([place])- (present on admission) - Continue long-acting insulin at a decreased dose.  Continue CBGs with SSI.  Carb modified diet.  Hyponatremia - Sodium 128 today.  Encourage oral intake.  Repeat a.m. labs.  Start normal saline at 75 cc an hour.  HTN (hypertension) - Monitor blood pressure.  Continue lisinopril.  Class 3 obesity ([place])- (present on admission) - Outpatient follow-up        Subjective:  Patient seen and examined at bedside.  Denies fever,  worsening shortness breath, vomiting or abdominal.  Physical Exam: Vitals:   03/09/21 0500 03/09/21 1356 03/09/21 2232 03/10/21 0553  BP:  116/76 (!) 111/59 106/67  Pulse:  86 84 76  Resp:  16 18 19   Temp:  97.8 F (36.6 C) 98.7 F (37.1 C) 98.4 F (36.9 C)  TempSrc:  Oral Oral Oral  SpO2:  100% 100% 97%  Weight: 136 kg     Height:       General: On room air.  No distress.   ENT/neck: No palpable thyromegaly.  Revision not elevated. respiratory: Decreased breath sounds at bases bilaterally, no wheezing  CVS: Currently rate controlled; S1-S2 heard  abdominal: Soft, nontender, morbidly obese, distended slightly; no organomegaly; normal bowel sounds are heard Extremities: Trace lower extremity edema present; no clubbing  CNS: Awake and alert.  No focal neurologic deficit.  Moves extremities. Lymph: No palpable lymphadenopathy  skin: No obvious petechiae/rashes.  Right foot dressing present  psych: Judgment, affect and mood are normal Musculoskeletal: No obvious other joint deformity/tenderness  Data Reviewed: I have reviewed patient's investigation during this hospitalization myself.  Today's sodium is 128, potassium 4.4, creatinine 0.79, WBC of 6.4, hemoglobin 11.8.   Family Communication: None at bedside   Disposition: Status is: Inpatient Remains inpatient appropriate because: Of need for surgical intervention        Planned Discharge Destination: Home         Time spent: 50 minutes  Author: Aline August, MD 03/10/2021 8:23 AM  For on call review [email or web address].

## 2021-03-11 ENCOUNTER — Encounter (HOSPITAL_COMMUNITY): Payer: Self-pay | Admitting: Orthopaedic Surgery

## 2021-03-11 DIAGNOSIS — E1165 Type 2 diabetes mellitus with hyperglycemia: Secondary | ICD-10-CM | POA: Diagnosis not present

## 2021-03-11 DIAGNOSIS — M86171 Other acute osteomyelitis, right ankle and foot: Secondary | ICD-10-CM | POA: Diagnosis not present

## 2021-03-11 DIAGNOSIS — M869 Osteomyelitis, unspecified: Secondary | ICD-10-CM | POA: Diagnosis not present

## 2021-03-11 DIAGNOSIS — E1169 Type 2 diabetes mellitus with other specified complication: Secondary | ICD-10-CM | POA: Diagnosis not present

## 2021-03-11 DIAGNOSIS — E871 Hypo-osmolality and hyponatremia: Secondary | ICD-10-CM | POA: Diagnosis not present

## 2021-03-11 DIAGNOSIS — I825Z1 Chronic embolism and thrombosis of unspecified deep veins of right distal lower extremity: Secondary | ICD-10-CM | POA: Diagnosis not present

## 2021-03-11 LAB — BASIC METABOLIC PANEL
Anion gap: 8 (ref 5–15)
BUN: 22 mg/dL — ABNORMAL HIGH (ref 6–20)
CO2: 20 mmol/L — ABNORMAL LOW (ref 22–32)
Calcium: 8.8 mg/dL — ABNORMAL LOW (ref 8.9–10.3)
Chloride: 100 mmol/L (ref 98–111)
Creatinine, Ser: 0.8 mg/dL (ref 0.44–1.00)
GFR, Estimated: >60 mL/min (ref >60)
Glucose, Bld: 313 mg/dL — ABNORMAL HIGH (ref 70–99)
Potassium: 4.5 mmol/L (ref 3.5–5.1)
Sodium: 128 mmol/L — ABNORMAL LOW (ref 135–145)

## 2021-03-11 LAB — CBC
HCT: 38.1 % (ref 36.0–46.0)
Hemoglobin: 11.9 g/dL — ABNORMAL LOW (ref 12.0–15.0)
MCH: 26.2 pg (ref 26.0–34.0)
MCHC: 31.2 g/dL (ref 30.0–36.0)
MCV: 83.9 fL (ref 80.0–100.0)
Platelets: 301 10*3/uL (ref 150–400)
RBC: 4.54 MIL/uL (ref 3.87–5.11)
RDW: 15 % (ref 11.5–15.5)
WBC: 13.1 10*3/uL — ABNORMAL HIGH (ref 4.0–10.5)
nRBC: 0 % (ref 0.0–0.2)

## 2021-03-11 LAB — GLUCOSE, CAPILLARY
Glucose-Capillary: 262 mg/dL — ABNORMAL HIGH (ref 70–99)
Glucose-Capillary: 268 mg/dL — ABNORMAL HIGH (ref 70–99)
Glucose-Capillary: 189 mg/dL — ABNORMAL HIGH (ref 70–99)
Glucose-Capillary: 272 mg/dL — ABNORMAL HIGH (ref 70–99)

## 2021-03-11 LAB — MAGNESIUM: Magnesium: 2.1 mg/dL (ref 1.7–2.4)

## 2021-03-11 LAB — C-REACTIVE PROTEIN: CRP: 3.2 mg/dL — ABNORMAL HIGH (ref <1.0)

## 2021-03-11 MED ORDER — INSULIN ASPART 100 UNIT/ML IJ SOLN
8.0000 [IU] | Freq: Three times a day (TID) | INTRAMUSCULAR | Status: DC
  Administered 2021-03-11 – 2021-03-14 (×10): 8 [IU] via SUBCUTANEOUS

## 2021-03-11 MED ORDER — INSULIN GLARGINE-YFGN 100 UNIT/ML ~~LOC~~ SOLN
60.0000 [IU] | Freq: Every day | SUBCUTANEOUS | Status: DC
  Administered 2021-03-11 – 2021-03-14 (×4): 60 [IU] via SUBCUTANEOUS
  Filled 2021-03-11 (×4): qty 0.6

## 2021-03-11 NOTE — Plan of Care (Signed)
Plan of care reviewed and discussed with the patient. 

## 2021-03-11 NOTE — Progress Notes (Signed)
Pharmacy Antibiotic Note  [name] is a 41 y.o. 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 Recent outpatient MRI on 02/28/2021 had shown cellulitis and features of residual osteomyelitis with possible abscess. She is s/p I&D on 2/25 and intra-op cultures are pending.  ID is consulted.  Pharmacy is been consulted for Vancomycin dosing.  Plan: Continue Vancomycin 1500mg  IV q12h. (Est AUC 485) Measure Vanc levels as needed.  Goal AUC = 400 - 550.   Cefepime and Metronidazole per MD Monitor renal function, cultures, clinical course  Height: 5\' 9"  (175.3 cm) Weight: (!) 143.3 kg (315 lb 14.7 oz) IBW/kg (Calculated) : 66.2  Temp (24hrs), Avg:97.9 F (36.6 C), Min:97.3 F (36.3 C), Max:98.4 F (36.9 C)  Recent Labs  Lab 03/08/21 1224 03/09/21 0054 03/10/21 0309 03/11/21 0340  WBC 8.3 9.0 6.4 13.1*  CREATININE 0.74 0.78 0.79 0.80     Estimated Creatinine Clearance: 144.6 mL/min (by C-G formula based on SCr of 0.8 mg/dL).    No Known Allergies  Antimicrobials this admission: 2/23 Vancomycin >> 2/23 Cefepime >> 2/23 Metronidazole >> 2/25 Preop abx: Cipro x1, vanc x1   Microbiology results: 2/23 BCx: ngtd 2/24 MRSA negative; Staph aureus negative 2/25 Wound (OR):  rare GPC, rare GVR   Thank you for allowing pharmacy to be a part of this patients care. Gretta Arab PharmD, BCPS Clinical Pharmacist [facility] main pharmacy [phone] 03/11/2021 8:33 AM

## 2021-03-11 NOTE — Consult Note (Signed)
WOC Nurse Consult Note: Reason for Consult:Daily wound care Wound type:Surgical Pressure Injury POA: N/A  Orthopedic MS Odis Hollingshead) wrote for WOC Nursing to perform daily wound care I communicated with both he and the hospitalist that these services were consistent with bedside RN role and Ortho wrote orders for daily wound care to be performed by the beside RN. Post discharge, the patient's wound care is to be performed at the outpatient wound care center. Please make those arrangements prior to discharge.  WOC nursing team will not follow, but will remain available to this patient, the nursing and medical teams.  Please re-consult if needed. Thanks, Ladona Mow, MSN, RN, GNP, [facility] [place]  Pager# [phone]

## 2021-03-11 NOTE — Progress Notes (Signed)
[facility] for Infectious Disease    Date of Admission:  03/08/2021   Total days of antibiotics 4           ID: [name] is a 41 y.o. female with   Active Problems:   Type 2 diabetes mellitus with hyperglycemia (HCC)   Class 3 obesity (HCC)   Osteomyelitis of right foot (HCC)   HTN (hypertension)   Deep vein thrombosis (DVT) of right lower extremity (HCC)   Hyponatremia    Subjective: She reports feeling better. Has some questions to how she is to do wound care for her foot at home. No fevers.  She states when she had picc line that the difficulty was due to sensitivity to dressing changes  Medications:   apixaban  5 mg Oral BID   gabapentin  600 mg Oral TID   insulin aspart  0-20 Units Subcutaneous TID WC   insulin aspart  0-5 Units Subcutaneous QHS   insulin aspart  8 Units Subcutaneous TID WC   insulin glargine-yfgn  60 Units Subcutaneous Daily   lisinopril  2.5 mg Oral Daily    Objective: Vital signs in last 24 hours: Temp:  [97.3 F (36.3 C)-98.4 F (36.9 C)] 97.9 F (36.6 C) (02/26 1322) Pulse Rate:  [67-103] 77 (02/26 1322) Resp:  [16-18] 18 (02/26 1322) BP: (108-135)/(59-71) 116/60 (02/26 1322) SpO2:  [98 %-100 %] 100 % (02/26 1322) Weight:  [143.3 kg] 143.3 kg (02/26 0553) Physical Exam  Constitutional:  oriented to person, place, and time. appears well-developed and well-nourished. No distress.  HENT: /AT, PERRLA, no scleral icterus Mouth/Throat: Oropharynx is clear and moist. No oropharyngeal exudate.  Cardiovascular: Normal rate, regular rhythm and normal heart sounds. Exam reveals no gallop and no friction rub.  No murmur heard.  Pulmonary/Chest: Effort normal and breath sounds normal. No respiratory distress.  has no wheezes.  Neck = supple, no nuchal rigidity Abdominal: Soft. Bowel sounds are normal.  exhibits no distension. There is no tenderness.  [id number] foot wrapped Neurological: alert and oriented to person, place, and time.   Skin: Skin is warm and dry. No rash noted. No erythema.  Psychiatric: a normal mood and affect.  behavior is normal.    Lab Results Recent Labs    03/10/21 0309 03/11/21 0340  WBC 6.4 13.1*  HGB 11.8* 11.9*  HCT 38.2 38.1  NA 128* 128*  K 4.4 4.5  CL 99 100  CO2 23 20*  BUN 15 22*  CREATININE 0.79 0.80   Liver Panel Recent Labs    03/09/21 0054  PROT 6.9  ALBUMIN 3.2*  AST 12*  ALT 11  ALKPHOS 64  BILITOT 0.3   Sedimentation Rate No results for input(s): ESRSEDRATE in the last 72 hours. C-Reactive Protein Recent Labs    03/09/21 0054 03/11/21 0340  CRP 4.4* 3.2*    Microbiology: reviewed Studies/Results: No results found.   Assessment/Plan: DFU-osteomyelitis, wound dehiscence of prior TMA  = hx of group b strep and prevotella osteomyelitis. Currently on vancomycin and cefepime plus oral metronidazole. Cultures are still pending. GPC noted - could reflect group B strep infection that she was recently treated. Would favor having her do 2-4 wk of IV therapy again before switching back to oral abtx. Will finalized abtx tomorrow as more information from micro returns. Would plan to do picc line tomorrow  Dr Landry Dyke singh to see tomorrow.   [facility] for Infectious Diseases Pager: [phone]  03/11/2021, 4:19 PM

## 2021-03-11 NOTE — Progress Notes (Signed)
Progress Note   Patient: [name] [id number] DOB: August 20, 1980 DOA: 03/08/2021     3 DOS: the patient was seen and examined on 03/11/2021   Brief hospital course:  41 y.o. 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  Patient had recent MRI of right foot on 02/28/2021 which had some cellulitis/residual osteomyelitis and possible abscess.  She was started on broad-spectrum antibiotics.  ID was also consulted.  She underwent I&D of right foot transmetatarsal amputation on 03/10/2021 by orthopedics.  Assessment and Plan: Osteomyelitis of right foot ([place])- (present on admission) -Continue broad-spectrum antibiotics.  ID following.  -underwent I&D of right foot transmetatarsal amputation on 03/10/2021 by orthopedics.  Follow OR cultures. -continue pain management. -Wound care as per orthopedics  Deep vein thrombosis (DVT) of right lower extremity ([place]) - Patient was found to have right lower extremity DVT in January 2023 and has been currently on Eliquis. -Initially treated with heparin drip for the procedure but subsequently Eliquis was resumed on 03/10/2021 after clearance was given by orthopedics.  Type 2 diabetes mellitus with hyperglycemia ([place])- (present on admission) - Increase long-acting insulin to 60 units daily.  Continue NovoLog with meals.  Continue CBGs with SSI.  Carb modified diet.  Hyponatremia - Sodium 128 today as well.  Encourage oral intake.  Repeat a.m. labs.  Treated with gentle hydration on  03/10/2021. -Hyponatremia is secondary to hyperglycemia as well.    HTN (hypertension) - Monitor blood pressure.  Continue lisinopril.  Class 3 obesity ([place])- (present on admission) - Outpatient follow-up        Subjective:  Patient seen and examined at bedside.  Complains of right lower extremity pain.  No worsening shortness of breath, abdominal pain, vomiting or fever reported.  Physical Exam: Vitals:   03/10/21 1820 03/10/21 2116 03/11/21 0138 03/11/21 0553  BP: 127/65 113/67 (!) 108/59 135/71  Pulse: 100 (!) 103 82 67  Resp: 16 18 18 17   Temp: 97.8 F (36.6 C) 98.4 F (36.9 C) (!) 97.3 F (36.3 C) 98.1 F (36.7 C)  TempSrc: Oral Oral Oral   SpO2: 98% 100% 100% 99%  Weight:    (!) 143.3 kg  Height:       General: No acute distress, currently on room air ENT/neck: No elevated JVD.  No obvious masses  respiratory: Bilateral decreased breath sounds at bases with some scattered crackles CVS: S1-S2 heard, intermittently tachycardic  abdominal: Soft, morbidly obese, nontender, mildly distended; no organomegaly, bowel sounds heard Extremities: No cyanosis, clubbing; mild lower extremity edema present CNS: Alert, awake and oriented.  No focal neurologic deficit.  Moving extremities. Lymph: No cervical lymphadenopathy Skin: No obvious rashes/ecchymosis.  Right foot dressing present  psych: Normal mood, affect and judgment Musculoskeletal: No obvious other joint deformity/swelling  Data Reviewed: I have reviewed patient's investigation during this hospitalization myself.  Today, sodium 128, bicarb of 20, CRP 3.2, WBC 13.1.  CBGs were as high as 464 on 03/10/2021.  Family Communication: None at bedside   Disposition: Status is: Inpatient Remains inpatient appropriate because: Of need for surgical intervention  Planned Discharge Destination: Home          Author: Aline August, MD 03/11/2021 7:46 AM  For on call review [email or web address].

## 2021-03-12 ENCOUNTER — Encounter (HOSPITAL_BASED_OUTPATIENT_CLINIC_OR_DEPARTMENT_OTHER): Payer: Self-pay | Admitting: Internal Medicine

## 2021-03-12 ENCOUNTER — Inpatient Hospital Stay: Payer: Self-pay

## 2021-03-12 DIAGNOSIS — I825Z1 Chronic embolism and thrombosis of unspecified deep veins of right distal lower extremity: Secondary | ICD-10-CM | POA: Diagnosis not present

## 2021-03-12 DIAGNOSIS — I10 Essential (primary) hypertension: Secondary | ICD-10-CM

## 2021-03-12 DIAGNOSIS — E871 Hypo-osmolality and hyponatremia: Secondary | ICD-10-CM | POA: Diagnosis not present

## 2021-03-12 DIAGNOSIS — M86171 Other acute osteomyelitis, right ankle and foot: Secondary | ICD-10-CM | POA: Diagnosis not present

## 2021-03-12 DIAGNOSIS — E1169 Type 2 diabetes mellitus with other specified complication: Secondary | ICD-10-CM | POA: Diagnosis not present

## 2021-03-12 LAB — BASIC METABOLIC PANEL
Anion gap: 6 (ref 5–15)
BUN: 22 mg/dL — ABNORMAL HIGH (ref 6–20)
CO2: 20 mmol/L — ABNORMAL LOW (ref 22–32)
Calcium: 8.7 mg/dL — ABNORMAL LOW (ref 8.9–10.3)
Chloride: 106 mmol/L (ref 98–111)
Creatinine, Ser: 0.71 mg/dL (ref 0.44–1.00)
GFR, Estimated: >60 mL/min (ref >60)
Glucose, Bld: 185 mg/dL — ABNORMAL HIGH (ref 70–99)
Potassium: 4.5 mmol/L (ref 3.5–5.1)
Sodium: 132 mmol/L — ABNORMAL LOW (ref 135–145)

## 2021-03-12 LAB — CBC
HCT: 36.9 % (ref 36.0–46.0)
Hemoglobin: 11.5 g/dL — ABNORMAL LOW (ref 12.0–15.0)
MCH: 26.3 pg (ref 26.0–34.0)
MCHC: 31.2 g/dL (ref 30.0–36.0)
MCV: 84.2 fL (ref 80.0–100.0)
Platelets: 279 10*3/uL (ref 150–400)
RBC: 4.38 MIL/uL (ref 3.87–5.11)
RDW: 15.4 % (ref 11.5–15.5)
WBC: 9.6 10*3/uL (ref 4.0–10.5)
nRBC: 0 % (ref 0.0–0.2)

## 2021-03-12 LAB — AEROBIC/ANAEROBIC CULTURE W GRAM STAIN (SURGICAL/DEEP WOUND)

## 2021-03-12 LAB — GLUCOSE, CAPILLARY
Glucose-Capillary: 137 mg/dL — ABNORMAL HIGH (ref 70–99)
Glucose-Capillary: 149 mg/dL — ABNORMAL HIGH (ref 70–99)
Glucose-Capillary: 196 mg/dL — ABNORMAL HIGH (ref 70–99)
Glucose-Capillary: 217 mg/dL — ABNORMAL HIGH (ref 70–99)

## 2021-03-12 LAB — C-REACTIVE PROTEIN: CRP: 1.7 mg/dL — ABNORMAL HIGH (ref <1.0)

## 2021-03-12 LAB — MAGNESIUM: Magnesium: 2 mg/dL (ref 1.7–2.4)

## 2021-03-12 NOTE — Progress Notes (Signed)
[facility] for Infectious Disease  Date of Admission:  03/08/2021   Total days of inpatient antibiotics 3  Active Problems:   Type 2 diabetes mellitus with hyperglycemia (HCC)   Class 3 obesity (HCC)   Osteomyelitis of right foot (HCC)   HTN (hypertension)   Deep vein thrombosis (DVT) of right lower extremity (HCC)   Hyponatremia          Assessment: 53 YF with Hx of diabetic foot wound SP TMA on 12/18/20 on Augmentin(EOT 2/16) admitted for worsening wound. Underwent debridement  and place don vancomycin, cefepime and metronidazole.  #Right foot TMA SP I&D on 2/25 # Diabetic foot wound with osteomyelitis SP TMA on 12/18/20 with Cx+ GBS complicated by wound dehiscence with Cx + Provetella(Augmentin continued) -MRI of right foot on 2/15 showed marrow edema along cuneiform and cuboid bone.  -Per OR note on 2/25- deep tissue with frank purulence noted. OR Cx gram stain showed GPC and gram variable rod.  Recommendations: -Continue vancomycin, cefepime, and metronidazole -Follow OR Cx(re-incubating) -Plan on 2-4 wks of IV therapy following by switch to PO antibiotics.     Microbiology:   Antibiotics: Vancomycin, cefepime and metronidazole 2/23-p Cultures: OR Cx: GS GPC, Cx re-incubating   SUBJECTIVE: Resting in bed. Report foot feels better.  Interval: Afebrile overnight.  Review of Systems: Review of Systems  All other systems reviewed and are negative.   Scheduled Meds:  apixaban  5 mg Oral BID   gabapentin  600 mg Oral TID   insulin aspart  0-20 Units Subcutaneous TID WC   insulin aspart  0-5 Units Subcutaneous QHS   insulin aspart  8 Units Subcutaneous TID WC   insulin glargine-yfgn  60 Units Subcutaneous Daily   lisinopril  2.5 mg Oral Daily   Continuous Infusions:  ceFEPime (MAXIPIME) IV 2 g (03/12/21 1300)   metronidazole 500 mg (03/12/21 0336)   vancomycin 1,500 mg (03/12/21 0900)   PRN Meds:.acetaminophen **OR** acetaminophen, morphine  injection, ondansetron **OR** ondansetron (ZOFRAN) IV, oxyCODONE, senna-docusate No Known Allergies  OBJECTIVE: Vitals:   03/11/21 0553 03/11/21 1322 03/11/21 2030 03/12/21 0612  BP: 135/71 116/60 118/61 94/60  Pulse: 67 77 78 72  Resp: 17 18 18 18   Temp: 98.1 F (36.7 C) 97.9 F (36.6 C) 97.9 F (36.6 C) 99 F (37.2 C)  TempSrc:  Oral Oral Oral  SpO2: 99% 100% 100% 94%  Weight: (!) 143.3 kg     Height:       Body mass index is 46.65 kg/m.  Physical Exam Constitutional:      Appearance: Normal appearance.  HENT:     Head: Normocephalic and atraumatic.     Right Ear: Tympanic membrane normal.     Left Ear: Tympanic membrane normal.     Nose: Nose normal.     Mouth/Throat:     Mouth: Mucous membranes are moist.  Eyes:     Extraocular Movements: Extraocular movements intact.     Conjunctiva/sclera: Conjunctivae normal.     Pupils: Pupils are equal, round, and reactive to light.  Cardiovascular:     Rate and Rhythm: Normal rate and regular rhythm.     Heart sounds: No murmur heard.   No friction rub. No gallop.  Pulmonary:     Effort: Pulmonary effort is normal.     Breath sounds: Normal breath sounds.  Abdominal:     General: Abdomen is flat.     Palpations: Abdomen is soft.  Musculoskeletal:     Comments: Right foot wound C/D/I.   Skin:    General: Skin is warm and dry.  Neurological:     General: No focal deficit present.     Mental Status: She is alert and oriented to person, place, and time.  Psychiatric:        Mood and Affect: Mood normal.      Lab Results Lab Results  Component Value Date   WBC 9.6 03/12/2021   HGB 11.5 (L) 03/12/2021   HCT 36.9 03/12/2021   MCV 84.2 03/12/2021   PLT 279 03/12/2021    Lab Results  Component Value Date   CREATININE 0.71 03/12/2021   BUN 22 (H) 03/12/2021   NA 132 (L) 03/12/2021   K 4.5 03/12/2021   CL 106 03/12/2021   CO2 20 (L) 03/12/2021    Lab Results  Component Value Date   ALT 11 03/09/2021   AST  12 (L) 03/09/2021   ALKPHOS 64 03/09/2021   BILITOT 0.3 03/09/2021        Laurice Record, [facility] 03/12/2021, 1:11 PM

## 2021-03-12 NOTE — Progress Notes (Signed)
°  Progress Note   Patient: [name] [id number] DOB: Jan 28, 1980 DOA: 03/08/2021     4 DOS: the patient was seen and examined on 03/12/2021   Brief hospital course:  41 y.o. 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  Patient had recent MRI of right foot on 02/28/2021 which had some cellulitis/residual osteomyelitis and possible abscess.  She was started on broad-spectrum antibiotics.  ID was also consulted.  She underwent I&D of right foot transmetatarsal amputation on 03/10/2021 by orthopedics.  Assessment and Plan: Osteomyelitis of right foot ([place])- (present on admission) -Continue broad-spectrum antibiotics.  ID following.  -underwent I&D of right foot transmetatarsal amputation on 03/10/2021 by orthopedics.  Follow OR cultures. -continue pain management. -Wound care as per orthopedics  Deep vein thrombosis (DVT) of right lower extremity ([place]) - Patient was found to have right lower extremity DVT in January 2023 and has been currently on Eliquis. -Initially treated with heparin drip for the procedure but subsequently Eliquis was resumed on 03/10/2021 after clearance was given by orthopedics.  Type 2 diabetes mellitus with hyperglycemia ([place])- (present on admission) - Continue long-acting and short-acting insulin.  Continue CBGs with SSI.  Carb modified diet.  Hyponatremia - Sodium 132 today as well.  Encourage oral intake.  Repeat a.m. labs.  Treated with gentle hydration on 03/10/2021 and subsequently  discontinued.   HTN (hypertension) - Monitor blood pressure.  Continue lisinopril.  Class 3 obesity ([place])- (present on admission) - Outpatient follow-up        Subjective:  Patient and examined at bedside.  Denies fever, worsening shortness of breath, vomiting or abdominal pain.  Physical Exam: Vitals:   03/11/21 0553 03/11/21 1322 03/11/21 2030 03/12/21 0612  BP: 135/71 116/60 118/61 94/60  Pulse: 67 77 78 72  Resp: 17 18 18 18   Temp: 98.1 F (36.7 C) 97.9 F (36.6 C) 97.9 F (36.6 C) 99 F (37.2 C)  TempSrc:  Oral Oral Oral  SpO2: 99% 100% 100% 94%  Weight: (!) 143.3 kg     Height:       General: On room air.  No distress.  respiratory: Decreased breath sounds at bases bilaterally with some crackles CVS: Currently rate controlled; S1-S2 heard  abdominal: Soft, nontender, morbidly obese, slightly distended, no organomegaly; normal bowel sounds are heard  extremities: Trace lower extremity edema; no clubbing.  Right foot dressing present   Data Reviewed: I have reviewed patient's investigation during this hospitalization myself.  Today, sodium is 132, potassium 4.5, creatinine 0.71, CRP 1.7, hemoglobin 11.5, WBC 9.6.  CBGs reviewed by myself.  Family Communication: None at bedside   Disposition: Status is: Inpatient Remains inpatient appropriate because: Of need for surgical intervention        Planned Discharge Destination: Home     Author: Aline August, MD 03/12/2021 7:20 AM  For on call review [email or web address].

## 2021-03-12 NOTE — Plan of Care (Signed)
  Problem: Pain Managment: Goal: General experience of comfort will improve Outcome: Progressing   Problem: Safety: Goal: Ability to remain free from injury will improve Outcome: Progressing   

## 2021-03-12 NOTE — Plan of Care (Signed)
Plan of care reviewed and discussed with the patient. 

## 2021-03-12 NOTE — Progress Notes (Signed)
At bedside for PICC placement. Pt stated she was unaware of PICC placement and prefers the line to be placed tomorrow,2/28.

## 2021-03-13 ENCOUNTER — Other Ambulatory Visit: Payer: Self-pay

## 2021-03-13 ENCOUNTER — Other Ambulatory Visit (HOSPITAL_COMMUNITY): Payer: Self-pay

## 2021-03-13 DIAGNOSIS — E871 Hypo-osmolality and hyponatremia: Secondary | ICD-10-CM | POA: Diagnosis not present

## 2021-03-13 DIAGNOSIS — I10 Essential (primary) hypertension: Secondary | ICD-10-CM | POA: Diagnosis not present

## 2021-03-13 DIAGNOSIS — I825Z1 Chronic embolism and thrombosis of unspecified deep veins of right distal lower extremity: Secondary | ICD-10-CM | POA: Diagnosis not present

## 2021-03-13 LAB — MAGNESIUM: Magnesium: 2 mg/dL (ref 1.7–2.4)

## 2021-03-13 LAB — BASIC METABOLIC PANEL
Anion gap: 7 (ref 5–15)
BUN: 24 mg/dL — ABNORMAL HIGH (ref 6–20)
CO2: 22 mmol/L (ref 22–32)
Calcium: 8.9 mg/dL (ref 8.9–10.3)
Chloride: 105 mmol/L (ref 98–111)
Creatinine, Ser: 0.68 mg/dL (ref 0.44–1.00)
GFR, Estimated: >60 mL/min (ref >60)
Glucose, Bld: 144 mg/dL — ABNORMAL HIGH (ref 70–99)
Potassium: 4.2 mmol/L (ref 3.5–5.1)
Sodium: 134 mmol/L — ABNORMAL LOW (ref 135–145)

## 2021-03-13 LAB — CBC
HCT: 36.8 % (ref 36.0–46.0)
Hemoglobin: 11.6 g/dL — ABNORMAL LOW (ref 12.0–15.0)
MCH: 26.4 pg (ref 26.0–34.0)
MCHC: 31.5 g/dL (ref 30.0–36.0)
MCV: 83.8 fL (ref 80.0–100.0)
Platelets: 265 10*3/uL (ref 150–400)
RBC: 4.39 MIL/uL (ref 3.87–5.11)
RDW: 15.3 % (ref 11.5–15.5)
WBC: 9.5 10*3/uL (ref 4.0–10.5)
nRBC: 0 % (ref 0.0–0.2)

## 2021-03-13 LAB — GLUCOSE, CAPILLARY
Glucose-Capillary: 109 mg/dL — ABNORMAL HIGH (ref 70–99)
Glucose-Capillary: 186 mg/dL — ABNORMAL HIGH (ref 70–99)
Glucose-Capillary: 199 mg/dL — ABNORMAL HIGH (ref 70–99)
Glucose-Capillary: 181 mg/dL — ABNORMAL HIGH (ref 70–99)

## 2021-03-13 LAB — CULTURE, BLOOD (ROUTINE X 2)
Culture: NO GROWTH
Culture: NO GROWTH

## 2021-03-13 MED ORDER — SODIUM CHLORIDE 0.9 % IV SOLN
2.0000 g | INTRAVENOUS | Status: DC
  Administered 2021-03-13 – 2021-03-14 (×2): 2 g via INTRAVENOUS
  Filled 2021-03-13 (×2): qty 20

## 2021-03-13 MED ORDER — HEPARIN SOD (PORK) LOCK FLUSH 100 UNIT/ML IV SOLN
250.0000 [IU] | INTRAVENOUS | Status: AC | PRN
  Administered 2021-03-13: 250 [IU]
  Filled 2021-03-13: qty 2.5

## 2021-03-13 MED ORDER — METRONIDAZOLE 500 MG PO TABS
500.0000 mg | ORAL_TABLET | Freq: Two times a day (BID) | ORAL | 0 refills | Status: DC
  Filled 2021-03-13: qty 36, 18d supply, fill #0

## 2021-03-13 MED ORDER — CHLORHEXIDINE GLUCONATE CLOTH 2 % EX PADS
6.0000 | MEDICATED_PAD | Freq: Every day | CUTANEOUS | Status: DC
  Administered 2021-03-13 – 2021-03-14 (×2): 6 via TOPICAL

## 2021-03-13 MED ORDER — SENNOSIDES-DOCUSATE SODIUM 8.6-50 MG PO TABS
1.0000 | ORAL_TABLET | Freq: Every evening | ORAL | 0 refills | Status: DC | PRN
  Filled 2021-03-13: qty 15, 15d supply, fill #0

## 2021-03-13 MED ORDER — METRONIDAZOLE 500 MG PO TABS
500.0000 mg | ORAL_TABLET | Freq: Two times a day (BID) | ORAL | Status: DC
  Administered 2021-03-13 – 2021-03-14 (×3): 500 mg via ORAL
  Filled 2021-03-13 (×3): qty 1

## 2021-03-13 MED ORDER — CEFTRIAXONE IV (FOR PTA / DISCHARGE USE ONLY): 2.0000 g | INTRAVENOUS | 0 refills | Status: AC

## 2021-03-13 MED ORDER — SODIUM CHLORIDE 0.9% FLUSH
10.0000 mL | Freq: Two times a day (BID) | INTRAVENOUS | Status: DC
  Administered 2021-03-13: 10 mL

## 2021-03-13 MED ORDER — SODIUM CHLORIDE 0.9% FLUSH
10.0000 mL | INTRAVENOUS | Status: DC | PRN
  Administered 2021-03-13: 14:00:00 10 mL

## 2021-03-13 MED ORDER — OXYCODONE HCL 5 MG PO TABS
ORAL_TABLET | ORAL | 0 refills | Status: DC
  Filled 2021-03-13: qty 28, 7d supply, fill #0

## 2021-03-13 NOTE — Discharge Instructions (Addendum)
[name] Cedars, MD [facility]  Please read the following information regarding your care after surgery.  Medications  You only need a prescription for the narcotic pain medicine (ex. oxycodone, Percocet, Norco).  All of the other medicines listed below are available over the counter. ? Aleve 2 pills twice a day for the first 3 days after surgery. ? acetominophen (Tylenol) 650 mg every 4-6 hours as you need for minor to moderate pain ? oxycodone as prescribed for severe pain  Weight Bearing ? Do NOT bear any weight on the operated leg or foot.  Cast / Splint / Dressing ? You should change the packing strip in your foot wound daily. It should be done in a sterile manner using gloves. You should NOT reuse the same strip. You should NOT cut the strip into multiple pieces. IMPORTANT: pack your wound with only one single long strip each time to avoid accidentally leaving behind cut pieces (that can be very dangerous). Call to follow up with Wound Care team after discharge within 7 days.   Swelling IMPORTANT: It is normal for you to have swelling where you had surgery. To reduce swelling and pain, keep at least 3 pillows under your leg so that your heel is above the level of your hip.  It may be necessary to keep your foot or leg elevated for several weeks.  This is critical to helping your incisions heal and your pain to feel better.  Follow Up Call my office at [phone] when you are discharged from the hospital or surgery center to schedule an appointment to be seen 7-10 days after surgery.  Call my office at [phone] if you develop a fever >101.5 F, nausea, vomiting, bleeding from the surgical site or severe pain.

## 2021-03-13 NOTE — Progress Notes (Signed)
Spoke with primary RN about PICC placement. Patient not ready for PICC placement at this time. RN will put IV team consult when patient is ready, aware that no guarantee when PICC RN is available due to PICC volume and travel to different campuses.

## 2021-03-13 NOTE — Progress Notes (Signed)
PHARMACY CONSULT NOTE FOR:  OUTPATIENT  PARENTERAL ANTIBIOTIC THERAPY (OPAT)  Indication: Wound infection of previous TMA site  Regimen: Ceftriaxone 2 gm IV Q 24 hours + Metronidazole 500 mg BID PO  End date: 03/31/21  IV antibiotic discharge orders are pended. To discharging provider:  please sign these orders via discharge navigator,  Select New Orders & click on the button choice - Manage This Unsigned Work.     Thank you for allowing pharmacy to be a part of this patient's care.  Sharin Mons, PharmD, BCPS, BCIDP Infectious Diseases Clinical Pharmacist Phone: [phone] 03/13/2021, 12:15 PM

## 2021-03-13 NOTE — Clinical Social Work Note (Signed)
CSW asked to secure charity [facility] for wound care. Treatment team aware this is unlikely as patient is uninsured and patient will require daily dressing changes, which HH would not provide even if patient was insured.  CSW made charity [facility] referral to [facility] with [facility], but patient was declined as she is out of their service area. Hospitalist and RN updated.

## 2021-03-13 NOTE — TOC Transition Note (Signed)
Transition of Care [facility]) - CM/SW Discharge Note   Patient Details  Name: [name] MRN: [id number] Date of Birth: [birth year]  [facility]) CM/SW Contact:  Amada Jupiter, LCSW Phone Number: 03/13/2021, 1:44 PM   Clinical Narrative:    Pt medically cleared for dc today and IV abx arranged via [facility] (they have followed pt after prior surgeries).  HHRN arrange via [facility] who are agreeable to accept with LOG for services.  Pt aware and agreeable.  Pt reports that her mother will be providing transport home.   Final next level of care: Home w Home Health Services Barriers to Discharge: Barriers Resolved   Patient Goals and CMS Choice Patient states their goals for this hospitalization and ongoing recovery are:: return home      Discharge Placement                       Discharge Plan and Services                DME Arranged: N/A DME Agency: NA       HH Arranged: RN, IV Antibiotics HH Agency: Surveyor, mining, Other - See comment Human resources officer) Date HH Agency Contacted: 03/13/21 Time HH Agency Contacted: 1343 Representative spoke with at [facility] Agency: [facility] - Jeri Modena, RN  Social Determinants of Health (SDOH) Interventions     Readmission Risk Interventions No flowsheet data found.

## 2021-03-13 NOTE — Progress Notes (Signed)
Discharge package printed and instructions given to patient. Patient verbalizes understanding. Educated patient and daughter on wound care as MD order. Await on IV ABT arrangement.

## 2021-03-13 NOTE — Progress Notes (Signed)
[facility] for Infectious Disease  Date of Admission:  03/08/2021   Total days of inpatient antibiotics 3  Active Problems:   Type 2 diabetes mellitus with hyperglycemia (HCC)   Class 3 obesity (HCC)   Osteomyelitis of right foot (HCC)   HTN (hypertension)   Deep vein thrombosis (DVT) of right lower extremity (HCC)   Hyponatremia          Assessment: 31 YF with Hx of diabetic foot wound SP TMA on 12/18/20  on Augmentin(EOT 2/16) admitted for worsening wound. Underwent debridement  and place don vancomycin, cefepime and metronidazole.  #Right foot TMA SP I&D on 2/25 # Diabetic foot wound with osteomyelitis SP TMA on 12/18/20 with Cx+ GBS  treated with ceftriaxone till 7/42/59 complicated by wound dehiscence with Cx + Provetella(Augmentin started with EOT 2/16) -MRI of right foot on 2/15 showed marrow edema along cuneiform and cuboid bone.  -Per OR note on 2/25- deep tissue with frank purulence noted. OR Cx +Prevotella bivia, gram stain  showed gram variable rods and gpc.  Recommendations: -D/C vancomycin, cefepime, and metronidazole -Place PICC. Pt reports mild pruritis is tolerable and improved from bandage -Start ceftriaxone and metronidazole (anticipate 3 weeks of IV therapy(03/30/21) following by PO antibiotics to complete total 6 weeks from OR-EOT 04/20/21). After completion of 3 weeks of IV antibiotics transition to cefadroxil and metronidazole.   OPAT ORDERS:  Diagnosis: DFU  Culture Result: 03/10/21 Provetella bivia  No Known Allergies   Discharge antibiotics to be given via PICC line:  Per pharmacy protocol  ceftriaxone IV 2gm qd and metronidazole 551m PO bid   Duration: 3 weeks End Date: 03/30/21  [facility] with [employer] Use: Home health RN for IV administration and teaching, line care and labs.    Labs weekly while on IV antibiotics: __ CBC with differential __ CMP __ CRP __ ESR __ Please leave PIC in place until doctor has seen  patient or been notified  Fax weekly labs to [phone] Clinic Follow Up Appt: 03/27/21 at 11:30 AM  @ [facility] with Dr. MAlphonsa Gin     Microbiology:   Antibiotics: Vancomycin, cefepime and metronidazole 2/23-p Cultures: OR Cx: GS GPC, Cx re-incubating   SUBJECTIVE: Pt feels well overnight. No new complaints.  Interval: Afebrile overnight.  Review of Systems: Review of Systems  All other systems reviewed and are negative.   Scheduled Meds:  apixaban  5 mg Oral BID   Chlorhexidine Gluconate Cloth  6 each Topical Daily   gabapentin  600 mg Oral TID   insulin aspart  0-20 Units Subcutaneous TID WC   insulin aspart  0-5 Units Subcutaneous QHS   insulin aspart  8 Units Subcutaneous TID WC   insulin glargine-yfgn  60 Units Subcutaneous Daily   lisinopril  2.5 mg Oral Daily   metroNIDAZOLE  500 mg Oral Q12H   sodium chloride flush  10-40 mL Intracatheter Q12H   Continuous Infusions:  cefTRIAXone (ROCEPHIN)  IV 2 g (03/13/21 1332)   PRN Meds:.acetaminophen **OR** acetaminophen, morphine injection, ondansetron **OR** ondansetron (ZOFRAN) IV, oxyCODONE, senna-docusate, sodium chloride flush No Known Allergies  OBJECTIVE: Vitals:   03/12/21 2208 03/13/21 0500 03/13/21 0647 03/13/21 1338  BP: 116/71  101/62 104/71  Pulse: 86  71 81  Resp: _0 Temp: 97.7 F (36.5 C)  98.4 F (36.9 C) (!) 97.5 F (36.4 C)  TempSrc: Oral  Oral   SpO2: 99%  97% 100%  Weight:  (!) 140.7 kg    Height:       Body mass index is 45.81 kg/m.  Physical Exam Constitutional:      Appearance: Normal appearance.  HENT:     Head: Normocephalic and atraumatic.     Right Ear: Tympanic membrane normal.     Left Ear: Tympanic membrane normal.     Nose: Nose normal.     Mouth/Throat:     Mouth: Mucous membranes are moist.  Eyes:     Extraocular Movements: Extraocular movements intact.     Conjunctiva/sclera: Conjunctivae normal.     Pupils: Pupils are equal, round, and reactive to  light.  Cardiovascular:     Rate and Rhythm: Normal rate and regular rhythm.     Heart sounds: No murmur heard.   No friction rub. No gallop.  Pulmonary:     Effort: Pulmonary effort is normal.     Breath sounds: Normal breath sounds.  Abdominal:     General: Abdomen is flat.     Palpations: Abdomen is soft.  Musculoskeletal:     Comments: Right foot wound C/D/I.   Skin:    General: Skin is warm and dry.  Neurological:     General: No focal deficit present.     Mental Status: She is alert and oriented to person, place, and time.  Psychiatric:        Mood and Affect: Mood normal.      Lab Results Lab Results  Component Value Date   WBC 9.5 03/13/2021   HGB 11.6 (L) 03/13/2021   HCT 36.8 03/13/2021   MCV 83.8 03/13/2021   PLT 265 03/13/2021    Lab Results  Component Value Date   CREATININE 0.68 03/13/2021   BUN 24 (H) 03/13/2021   NA 134 (L) 03/13/2021   K 4.2 03/13/2021   CL 105 03/13/2021   CO2 22 03/13/2021    Lab Results  Component Value Date   ALT 11 03/09/2021   AST 12 (L) 03/09/2021   ALKPHOS 64 03/09/2021   BILITOT 0.3 03/09/2021        Laurice Record, [place] for Infectious Disease [facility] 03/13/2021, 3:34 PM

## 2021-03-13 NOTE — Progress Notes (Signed)
°  Progress Note   Patient: [name] [id number] DOB: 07-24-1980 DOA: 03/08/2021     5 DOS: the patient was seen and examined on 03/13/2021   Brief hospital course:  41 y.o. 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  Patient had recent MRI of right foot on 02/28/2021 which had some cellulitis/residual osteomyelitis and possible abscess.  She was started on broad-spectrum antibiotics.  ID was also consulted.  She underwent I&D of right foot transmetatarsal amputation on 03/10/2021 by orthopedics.  Assessment and Plan: Osteomyelitis of right foot ([place])- (present on admission) -Continue broad-spectrum antibiotics.  ID following.  -underwent I&D of right foot transmetatarsal amputation on 03/10/2021 by orthopedics.  -continue pain management. -Wound care as per orthopedics -PICC line to be placed possibly today.  Deep vein thrombosis (DVT) of right lower extremity ([place]) - Patient was found to have right lower extremity DVT in January 2023 and has been currently on Eliquis. -Initially treated with heparin drip for the procedure but subsequently Eliquis was resumed on 03/10/2021 after clearance was given by orthopedics.  Type 2 diabetes mellitus with hyperglycemia ([place])- (present on admission) - Continue long-acting and short-acting insulin.  Continue CBGs with SSI.  Carb modified diet.  Hyponatremia - Improving.  Sodium 134 today.  Encourage oral intake.  Repeat a.m. labs.  Treated with gentle hydration on 03/10/2021 and  subsequently discontinued.   HTN (hypertension) - Monitor blood pressure.  Continue lisinopril.  Class 3 obesity ([place])- (present on admission) - Outpatient follow-up        Subjective:  Patient seen and examined at bedside.  No overnight fever, nausea, vomiting reported.  Physical Exam: Vitals:   03/12/21 1406 03/12/21 2208 03/13/21 0500 03/13/21 0647  BP: (!) 113/58 116/71  101/62  Pulse: 85 86  71  Resp: 20 17  17   Temp: 98.5 F (36.9 C) 97.7 F (36.5 C)  98.4 F (36.9 C)  TempSrc: Oral Oral  Oral  SpO2: 100% 99%  97%  Weight:   (!) 140.7 kg   Height:       General: No acute distress.  Currently on room air respiratory: Bilateral decreased air sounds at bases with scattered crackles  CVS: S1-S2 heard; rate controlled currently abdominal: Soft, morbidly obese, nontender, distended mildly; no organomegaly; bowel sounds are heard  extremities: Mild lower extremity edema present.  Right foot dressing present  Data Reviewed: I have reviewed patient's investigation during this hospitalization myself.  Today, sodium is 134, potassium 4.2, creatinine 0.68, WBCs 9.5, platelets 265, hemoglobin 11.6  Family Communication: None at bedside   Disposition: Status is: Inpatient Remains inpatient appropriate because: PICC line placement, IV antibiotics remains to be finalized by ID.  Patient will need IV antibiotics on discharge home   planned Discharge Destination: Home once PICC line is placed and discharged antibiotic regimen is finalized by ID.   Author: Aline August, MD 03/13/2021 10:31 AM  For on call review [email or web address].

## 2021-03-13 NOTE — Plan of Care (Signed)
  Problem: Safety: Goal: Ability to remain free from injury will improve Outcome: Progressing   Problem: Pain Managment: Goal: General experience of comfort will improve Outcome: Progressing   

## 2021-03-13 NOTE — Discharge Summary (Signed)
Physician Discharge Summary   Patient: [name] MRN: [id number] DOB: 02-01-1980  Admit date:     03/08/2021  Discharge date: 03/13/21  Discharge Physician: Aline August   PCP: Kerin [place], NP   Recommendations at discharge:   Follow-up with PCP within a week Outpatient follow-up with orthopedics and ID Follow-up in the ED if symptoms worsen or new.   Hospital Course:  41 y.o. 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  Patient had recent MRI of right foot on 02/28/2021 which had some cellulitis/residual osteomyelitis and possible abscess.  She was started on broad-spectrum antibiotics.  ID was also consulted.  She underwent I&D of right foot transmetatarsal amputation on 03/10/2021 by orthopedics. Subsequently, she has remained stable.  She underwent PICC line placement today ID has cleared the patient for discharge on IV Rocephin and oral Flagyl till 03/31/2021.  Outpatient follow-up with ID and orthopedics.  Discharge patient home today.  Discharge diagnosis and assessment and Plan: Osteomyelitis of right foot ([place])- (present on admission) -Continue broad-spectrum antibiotics.  ID following.  -underwent I&D of right foot transmetatarsal amputation on 03/10/2021 by orthopedics.  -continue pain management. -PICC line placed possibly today. -ID has cleared the patient for discharge on IV Rocephin and oral Flagyl till 03/31/2021.  Outpatient follow-up with ID and orthopedics.  Discharge patient home  today. -Discharge wound care as per orthopedic/wound care consult recommendations  Deep vein thrombosis (DVT) of right lower extremity ([place]) - Patient was found to have right lower extremity DVT in January 2023 and has been currently on Eliquis. -Initially treated with heparin drip for the procedure but subsequently Eliquis was resumed on 03/10/2021 after clearance was given by orthopedics.  Type 2 diabetes mellitus with hyperglycemia ([place])- (present on admission) - Continue home regimen.  Carb modified diet.  Outpatient follow-up with PCP  Hyponatremia - Improving.  Sodium 134 today.  Encourage oral intake.  Outpatient follow-up of BMP.  Treated with gentle hydration on 03/10/2021 and subsequently discontinued.   HTN (hypertension) - Monitor blood pressure.  Continue lisinopril.  Class 3 obesity ([place])- (present on admission) - Outpatient follow-up    Consultants: Orthopedic/ID Procedures performed: I&D of right foot transmetatarsal amputation on 03/10/2021 by orthopedics Disposition: Home Diet recommendation:  Discharge Diet Orders (From admission, onward)     Start     Ordered   03/13/21 0000  Diet - low sodium heart healthy        03/13/21 1243   03/13/21 0000  Diet Carb Modified        03/13/21 1243           Cardiac and Carb modified diet  DISCHARGE MEDICATION: Allergies as of 03/13/2021   No Known Allergies      Medication List     STOP taking these medications    amoxicillin-clavulanate 875-125 MG tablet Commonly known as: Augmentin   oxyCODONE 5 MG immediate release tablet Commonly known as: Oxy IR/ROXICODONE       TAKE these medications    acetaminophen 325 MG tablet Commonly known as: TYLENOL Take 2 tablets (650 mg total) by mouth  every 6 (six) hours as needed for mild pain (or Fever >/= 101).   Basaglar KwikPen 100 UNIT/ML Inject 60 Units into the skin daily.   cefTRIAXone  IVPB Commonly known as: ROCEPHIN Inject 2 g into the vein daily  for 18 days. Indication:  Wound infection of previous TMA site  First Dose: Yes Last Day of Therapy:  03/31/21  Labs - Once weekly:  CBC/D and BMP, Labs - Every other week:  ESR and CRP Method of administration: IV Push Method of administration may be changed at the discretion of home infusion pharmacist based upon assessment of the patient and/or caregiver's ability to self-administer the medication ordered.   Eliquis 5 MG Tabs tablet Generic drug: apixaban Take  one-72m tablet twice daily.   gabapentin 300 MG capsule Commonly known as: NEURONTIN Take 2 capsules by mouth 3 times daily.   lisinopril 2.5 MG tablet Commonly known as: ZESTRIL Take 1 tablet by mouth daily   metFORMIN 500 MG 24 hr tablet Commonly known as: GLUCOPHAGE-XR Take 2 tablets (1,000 mg total) by mouth 2 (two) times daily.   metroNIDAZOLE 500 MG tablet Commonly known as: FLAGYL Take 1 tablet (500 mg total) by mouth every 12 (twelve) hours for 18 days. Treat through 03/31/21   ondansetron 4 MG tablet Commonly known as: ZOFRAN Take 1 tablet (4 mg total) by mouth every 6 (six) hours as needed for nausea.   senna-docusate 8.6-50 MG tablet Commonly known as: Senokot-S Take 1 tablet by mouth at bedtime as needed for mild constipation.   TechLite Pen Needles 32G X 4 MM Misc Generic drug: Insulin Pen Needle use as directed   True Metrix Blood Glucose Test test strip Generic drug: glucose blood Use to check blood sugar twice a day.   True Metrix Meter w/Device Kit Use to check blood sugar twice a day.   TRUEplus Lancets 28G Misc Use to check blood sugar twice a day.               Discharge Care Instructions  (From admission, onward)           Start     Ordered   03/13/21 0000  Change dressing on IV access line weekly and PRN  (Home infusion instructions - [facility] )        03/13/21 1243   03/13/21 0000  Discharge wound care:       Comments: As per orthopedics/wound care  recommendations   03/13/21 1243             Subjective: Patient seen and examined at bedside.  No overnight fever, nausea, vomiting reported. Discharge Exam: Filed Weights   03/09/21 0500 03/11/21 0553 03/13/21 0500  Weight: 136 kg (!) 143.3 kg (!) 140.7 kg   General: No acute distress.  Currently on room air respiratory: Bilateral decreased air sounds at bases with scattered crackles  CVS: S1-S2 heard; rate controlled currently abdominal: Soft, morbidly obese, nontender, distended mildly; no organomegaly; bowel sounds are heard  extremities: Mild lower extremity edema present.  Right foot dressing present  Condition at discharge: good  The results of significant diagnostics from this hospitalization (including imaging, microbiology, ancillary and laboratory) are listed below for reference.   Imaging Studies: MR FOOT RIGHT W WO CONTRAST  Result Date: 02/28/2021 CLINICAL DATA:  Prior amputation. History of diabetes. Evaluate for recurrent osteomyelitis. EXAM: MRI OF THE RIGHT FOREFOOT WITHOUT AND WITH CONTRAST TECHNIQUE: Multiplanar, multisequence MR imaging of the right forefoot was performed before  and after the administration of intravenous contrast. CONTRAST:  53mL GADAVIST GADOBUTROL 1 MMOL/ML IV SOLN COMPARISON:  12/17/2020 FINDINGS: Bones/Joint/Cartilage Large soft tissue wound overlying the transmetatarsal amputation. Bone marrow edema within the metatarsal stumps. Mild marrow edema along the distal dorsal plantar aspect of the medial cuneiform. Mild subchondral marrow edema along the distal dorsal middle cuneiform. Mild bone marrow edema along the distal plantar aspect of the cuboid. Severe surrounding soft tissue edema along the amputation site consistent with cellulitis. Complex fluid collection along the plantar medial aspect of the foot at the level of the TMT joints measuring 4.5 x 1 x 2.8 cm with thick peripheral enhancement and small amount air within the collection with a  sinus tract extending to the skin surface. Small posterior subtalar joint effusion. No marrow signal abnormality. Ligaments Ligaments are unremarkable. Muscles and Tendons T2 hyperintense signal throughout the plantar musculature which may be neurogenic versus secondary to myositis. Soft tissue No other fluid collection or hematoma. No soft tissue mass. Soft tissue enhancement at the amputation site. Soft tissue edema and skin thickening involving the dorsal aspect of the foot and to lesser extent the plantar aspect. IMPRESSION: 1. Large soft tissue wound overlying the transmetatarsal amputation with surrounding cellulitis. Bone marrow edema within the metatarsal stumps concerning for residual osteomyelitis. Complex fluid collection along the plantar medial aspect of the foot at the level of the TMT joints measuring 4.5 x 1 x 2.8 cm with thick peripheral enhancement and small amount air within the collection with a sinus tract extending to the skin surface consistent with an abscess. 2. Mild marrow edema along the distal dorsal plantar aspect of the medial cuneiform. Mild bone marrow edema along the distal plantar aspect of the cuboid. This may reflect reactive marrow changes secondary to arthritis versus early osteomyelitis. Electronically Signed   By: Kathreen Devoid M.D.   On: 02/28/2021 07:20   DG Foot Complete Right  Result Date: 03/08/2021 CLINICAL DATA:  Patient arrived via wheelchair after seeing her PCP this morning. DVT in right leg from knee to groin. Abscess on right foot. Amputation in December. EXAM: RIGHT FOOT COMPLETE - 3+ VIEW COMPARISON:  MRI examination dated February 27, 2021 FINDINGS: Status post prior amputation through the bases of the metatarsals. No cortical erosion or periosteal reaction concerning for osteomyelitis. Osteopenia. Marked subcutaneous soft tissue edema and swelling about the foot. IMPRESSION: 1. Status post amputation through the bases of the metatarsals without radiographic  evidence of acute osteomyelitis. Evaluation early osteomyelitis is however limited on radiographs. 2. Marked skin thickening and subcutaneous soft tissue swelling about the foot consistent with cellulitis. Electronically Signed   By: Keane Police D.O.   On: 03/08/2021 15:18   VAS [place] LOWER EXTREMITY VENOUS (DVT)  Result Date: 03/08/2021  Lower Venous DVT Study Patient Name:  FRANCENA ZENDER  Date of Exam:   03/08/2021 Medical Rec #: [id number]       Accession #:    [id number] Date of Birth: [birth year]       Patient Gender: F Patient Age:   25 years Exam [facility]  [facility] Procedure:      VAS [place] LOWER EXTREMITY VENOUS (DVT) Referring Phys: Beverley Fiedler SOTO --------------------------------------------------------------------------------  Indications: Follow up right lower extremity DVT.  Comparison Study: 01/30/2021- extensive right lower extremity DVT Performing Technologist: Maudry Mayhew MHA, RDMS, RVT, RDCS  Examination Guidelines: A complete evaluation includes B-mode imaging, spectral Doppler, color Doppler, and power Doppler as needed of all accessible portions of each  vessel. Bilateral testing is considered an integral part of a complete examination. Limited examinations for reoccurring indications may be performed as noted. The reflux portion of the exam is performed with the patient in reverse Trendelenburg.  +---------+---------------+---------+-----------+----------+--------------+  RIGHT     Compressibility Phasicity Spontaneity Properties Thrombus Aging  +---------+---------------+---------+-----------+----------+--------------+  CFV       Full            Yes       Yes         patent                     +---------+---------------+---------+-----------+----------+--------------+  SFJ       Full                                                             +---------+---------------+---------+-----------+----------+--------------+  FV Prox   Full                      Yes         patent                      +---------+---------------+---------+-----------+----------+--------------+  FV Mid    Full                      Yes         patent                     +---------+---------------+---------+-----------+----------+--------------+  FV Distal Full                      Yes         patent                     +---------+---------------+---------+-----------+----------+--------------+  POP       Full            Yes       Yes         patent                     +---------+---------------+---------+-----------+----------+--------------+  PTV       Full                      Yes         patent                     +---------+---------------+---------+-----------+----------+--------------+  PERO      Full                      Yes         patent                     +---------+---------------+---------+-----------+----------+--------------+   Right Technical Findings: Not visualized segments include PFV, limited visualization PTV peroneal veins.  +----+---------------+---------+-----------+----------+--------------+  LEFT Compressibility Phasicity Spontaneity Properties Thrombus Aging  +----+---------------+---------+-----------+----------+--------------+  CFV  Full            Yes       Yes                                    +----+---------------+---------+-----------+----------+--------------+  Summary: RIGHT: - There is no obvious evidence of acute deep vein thrombosis in the lower extremity. Findings improved when compared to prior study.  - No cystic structure found in the popliteal fossa.  LEFT: - No evidence of common femoral vein obstruction.  *See table(s) above for measurements and observations. Electronically signed by Orlie Pollen on 03/08/2021 at 6:11:18 PM.    Final    [place] EKG SITE RITE  Result Date: 03/12/2021 If [facility] image not attached, placement could not be confirmed due to current cardiac rhythm.   Microbiology: Results for orders placed or performed during the hospital encounter of 03/08/21   Blood culture (routine x 2)     Status: None   Collection Time: 03/08/21 12:23 PM   Specimen: BLOOD  Result Value Ref Range Status   Specimen Description   Final    BLOOD BLOOD RIGHT FOREARM Performed at [facility] [address] [place], [place] [address]    Special Requests   Final    BOTTLES DRAWN AEROBIC AND ANAEROBIC Blood Culture results may not be optimal due to an inadequate volume of blood received in culture bottles Performed at [facility] [address], [place], [place] [address]    Culture   Final    NO GROWTH 5 DAYS Performed at [facility], [place] [address] [place], [place] [address]    Report Status 03/13/2021 FINAL  Final  Resp Panel by RT-PCR (Flu A&B, Covid) Peripheral     Status: None   Collection Time: 03/08/21  3:11 PM   Specimen: Peripheral; Nasopharyngeal(NP) swabs in vial transport medium  Result Value Ref Range Status   SARS Coronavirus 2 by RT PCR NEGATIVE NEGATIVE Final    Comment: (NOTE) SARS-CoV-2 target nucleic acids are NOT DETECTED.  The SARS-CoV-2 RNA is generally detectable in upper respiratory specimens during the acute phase of infection. The lowest concentration of SARS-CoV-2 viral copies this assay can detect is 138 copies/mL. A negative result does not preclude SARS-Cov-2 infection and should not be used as the sole basis for treatment or other patient management decisions. A negative result may occur with  improper specimen collection/handling, submission of specimen other than nasopharyngeal swab, presence of viral mutation(s) within the areas targeted by this assay, and inadequate number of viral copies(<138 copies/mL). A negative result must be combined with clinical observations, patient history, and epidemiological information. The expected result is Negative.  Fact Sheet for Patients:  EntrepreneurPulse.com.au  Fact Sheet for Healthcare Providers:   IncredibleEmployment.be  This test is no t yet approved or cleared by the [place] FDA and  has been authorized for detection and/or diagnosis of SARS-CoV-2 by FDA under an Emergency Use Authorization (EUA). This EUA will remain  in effect (meaning this test can be used) for the duration of the COVID-19 declaration under Section 564(b)(1) of the Act, 21 U.S.C.section 360bbb-3(b)(1), unless the authorization is terminated  or revoked sooner.       Influenza A by PCR NEGATIVE NEGATIVE Final   Influenza B by PCR NEGATIVE NEGATIVE Final    Comment: (NOTE) The Xpert Xpress SARS-CoV-2/FLU/RSV plus assay is intended as an aid in the diagnosis of influenza from Nasopharyngeal swab specimens and should not be used as a sole basis for treatment. Nasal washings and aspirates are unacceptable for Xpert Xpress SARS-CoV-2/FLU/RSV testing.  Fact Sheet for Patients: EntrepreneurPulse.com.au  Fact Sheet for Healthcare Providers: IncredibleEmployment.be  This test is not yet approved or cleared by the  [place] Architectural technologist and has been authorized for detection and/or diagnosis of SARS-CoV-2 by FDA under an Print production planner (EUA). This EUA will remain in effect (meaning this test can be used) for the duration of the COVID-19 declaration under Section 564(b)(1) of the Act, 21 U.S.C. section 360bbb-3(b)(1), unless the authorization is terminated or revoked.  Performed at [facility], [place] [address], [place], [place] [address]   Blood culture (routine x 2)     Status: None   Collection Time: 03/08/21  3:40 PM   Specimen: BLOOD  Result Value Ref Range Status   Specimen Description   Final    BLOOD RIGHT ANTECUBITAL Performed at [facility] [address], [place], [place] [address]    Special Requests   Final    BOTTLES DRAWN AEROBIC AND ANAEROBIC Blood Culture results may not be  optimal due to an inadequate volume of blood received in culture bottles Performed at [facility] [address], [place], [place] [address]    Culture   Final    NO GROWTH 5 DAYS Performed at [facility], [place] [address] [place], [place] [address]    Report Status 03/13/2021 FINAL  Final  Surgical pcr screen     Status: None   Collection Time: 03/09/21 11:00 PM   Specimen: Nasal Mucosa; Nasal Swab  Result Value Ref Range Status   MRSA, PCR NEGATIVE NEGATIVE Final   Staphylococcus aureus NEGATIVE NEGATIVE Final    Comment: (NOTE) The Xpert SA Assay (FDA approved for NASAL specimens in patients 52 years of age and older), is one component of a comprehensive surveillance program. It is not intended to diagnose infection nor to guide or monitor treatment. Performed at [facility], [place] [address] [place], [place] [address]   Aerobic/Anaerobic Culture w Gram Stain (surgical/deep wound)     Status: None   Collection Time: 03/10/21  8:17 AM   Specimen: PATH Other; Tissue  Result Value Ref Range Status   Specimen Description   Final    WOUND Performed at [facility] [address], [place], [place] [address]    Special Requests   Final    NONE Performed at [facility], [place] [address] [place], [place] [address]    Gram Stain   Final    RARE WBC PRESENT,BOTH PMN AND MONONUCLEAR RARE GRAM POSITIVE COCCI RARE GRAM VARIABLE ROD    Culture   Final    ABUNDANT PREVOTELLA BIVIA BETA LACTAMASE POSITIVE Performed at [facility], [place] [address], [place], [place] [address]    Report Status 03/12/2021 FINAL  Final    Labs: CBC: Recent Labs  Lab 03/08/21 1224 03/09/21 0054 03/10/21 0309 03/11/21 0340 03/12/21 0320 03/13/21 0316  WBC 8.3 9.0 6.4 13.1* 9.6 9.5  NEUTROABS 5.4  --   --   --   --   --   HGB 12.9 11.8* 11.8* 11.9* 11.5* 11.6*  HCT 41.0 37.0 38.2 38.1 36.9 36.8  MCV 82.5  83.0 84.3 83.9 84.2 83.8  PLT 283 266 263 301 279 242   Basic Metabolic Panel: Recent Labs  Lab 03/09/21 0054 03/10/21 0309 03/10/21 1917 03/11/21 0340 03/12/21 0320 03/13/21 0316  NA 134* 128*  --  128* 132* 134*  K 3.9 4.4  --  4.5 4.5 4.2  CL 102 99  --  100 106 105  CO2 24 23  --  20* 20* 22  GLUCOSE 174* 350* 463* 313* 185* 144*  BUN 17 15  --  22* 22* 24*  CREATININE 0.78 0.79  --  0.80 0.71 0.68  CALCIUM 8.9 8.4*  --  8.8* 8.7* 8.9  MG 1.9 2.0  --  2.1 2.0 2.0   Liver Function Tests: Recent Labs  Lab 03/08/21 1224 03/09/21 0054  AST 17 12*  ALT 14 11  ALKPHOS 69 64  BILITOT 0.5 0.3  PROT 7.8 6.9  ALBUMIN 3.6 3.2*   CBG: Recent Labs  Lab 03/12/21 1226 03/12/21 1758 03/12/21 2231 03/13/21 0753 03/13/21 1142  GLUCAP 149* 196* 217* 109* 186*    Discharge time spent: greater than 30 minutes.  Signed: Aline August, MD [facility] 03/13/2021

## 2021-03-13 NOTE — Progress Notes (Signed)
Peripherally Inserted Central Catheter Placement  The IV Nurse has discussed with the patient and/or persons authorized to consent for the patient, the purpose of this procedure and the potential benefits and risks involved with this procedure.  The benefits include less needle sticks, lab draws from the catheter, and the patient may be discharged home with the catheter. Risks include, but not limited to, infection, bleeding, blood clot (thrombus formation), and puncture of an artery; nerve damage and irregular heartbeat and possibility to perform a PICC exchange if needed/ordered by physician.  Alternatives to this procedure were also discussed.  Bard Power PICC patient education guide, fact sheet on infection prevention and patient information card has been provided to patient /or left at bedside.    PICC Placement Documentation  PICC Single Lumen [id number] Right Basilic 41 cm 0 cm (Active)  Indication for Insertion or Continuance of Line Home intravenous therapies (PICC only) 03/13/21 1058  Exposed Catheter (cm) 0 cm 03/13/21 1058  Site Assessment Clean, Dry, Intact 03/13/21 1058  Line Status Flushed;Saline locked;Blood return noted 03/13/21 1058  Dressing Type Securing device;Transparent 03/13/21 1058  Dressing Status Antimicrobial disc in place;Clean, Dry, Intact 03/13/21 1058  Safety Lock Not Applicable [id number]  Line Adjustment (NICU/IV Team Only) No 03/13/21 1058  Dressing Intervention New dressing;Other (Comment) 03/13/21 1058  Dressing Change Due 03/20/21 03/13/21 [place] 03/13/2021, 11:00 AM

## 2021-03-14 ENCOUNTER — Ambulatory Visit: Payer: Self-pay | Admitting: Pharmacist

## 2021-03-14 ENCOUNTER — Other Ambulatory Visit (HOSPITAL_COMMUNITY): Payer: Self-pay

## 2021-03-14 DIAGNOSIS — L089 Local infection of the skin and subcutaneous tissue, unspecified: Secondary | ICD-10-CM

## 2021-03-14 LAB — GLUCOSE, CAPILLARY
Glucose-Capillary: 158 mg/dL — ABNORMAL HIGH (ref 70–99)
Glucose-Capillary: 165 mg/dL — ABNORMAL HIGH (ref 70–99)

## 2021-03-14 LAB — SURGICAL PATHOLOGY

## 2021-03-14 MED ORDER — HEPARIN SOD (PORK) LOCK FLUSH 100 UNIT/ML IV SOLN
250.0000 [IU] | INTRAVENOUS | Status: AC | PRN
  Administered 2021-03-14: 250 [IU]
  Filled 2021-03-14: qty 2.5

## 2021-03-14 NOTE — Progress Notes (Signed)
Nurse reviewed discharge instructions with pt.  Pt verbalized understanding of discharge instructions, follow up appointments and new medications.  No concerns at time of discharge. 

## 2021-03-14 NOTE — Progress Notes (Signed)
Subjective: ?4 Days Post-Op Procedure(s) (LRB): ?MINOR IRRIGATION AND DEBRIDEMENT OF WOUND (Right) ? ?Patient reports pain as mild. She has been undergoing wound packing daily.  ? ?Objective:  ? ?VITALS:  Temp:  [97.5 ?F (36.4 ?C)-98.6 ?F (37 ?C)] 98.5 ?F (36.9 ?C) (03/01 2952) ?Pulse Rate:  [77-81] 77 (03/01 8413) ?Resp:  [17-18] 17 (03/01 2440) ?BP: (104-109)/(57-71) 109/57 (03/01 1027) ?SpO2:  [99 %-100 %] 99 % (03/01 2536) ?Weight:  [135.4 kg] 135.4 kg (03/01 0648) ? ?Gen: AAOx3, NAD ?Comfortable at rest ? ?Right Lower Extremity: ?Wound bed with healthy granulation tissue ?Improved swelling from time of surgery ?Packing strip in place ?No surrounding erythema/discharge ?Skin intact otherwise ?No TTP ?HF/KE/KF/ADF/APF 5/5 ?SILT throughout ?DP, PT 2+ to palp ?CR < 2s ? ? ? ?LABS ?Recent Labs  ?  03/12/21 ?0320 03/13/21 ?0316  ?HGB 11.5* 11.6*  ?WBC 9.6 9.5  ?PLT 279 265  ? ?Recent Labs  ?  03/12/21 ?0320 03/13/21 ?0316  ?NA 132* 134*  ?K 4.5 4.2  ?CL 106 105  ?CO2 20* 22  ?BUN 22* 24*  ?CREATININE 0.71 0.68  ?GLUCOSE 185* 144*  ? ?No results for input(s): LABPT, INR in the last 72 hours. ? ? ?Assessment/Plan: ?4 Days Post-Op Procedure(s) (LRB): ?MINOR IRRIGATION AND DEBRIDEMENT OF WOUND (Right) ? ?-strict NWB operative extremity, maximum elevation, ok to use heel weightbearing for transfers and balance ?-wound care consult for daily iodoform wound packing due to deep tunneling ?-antibiotics and anticoagulation per primary team ?-appreciate primary team care and ID recs ?-follow up as outpatient with Wound Care team within a few days after discharge ?-follow up as outpatient with Ortho in 3 weeks after discharge for wound check ?-she will likely eventually need Plastics referral once packing is done to evaluate for skin graft ? ?Netta Cedars ?03/14/2021, 9:06 AM ? ?

## 2021-03-14 NOTE — Progress Notes (Signed)
Nurse observed pt's mother doing dressing change at bedside prior to discharge. Pt's mother is able and willing to assist pt with her dressing changes at home.  ?

## 2021-03-15 ENCOUNTER — Telehealth: Payer: Self-pay

## 2021-03-15 NOTE — Telephone Encounter (Addendum)
Transition Care Management Follow-up Telephone Call ?Date of discharge and from where: 03/14/2021, [facility]  ?How have you been since you were released from the hospital? She stated that so far, she is doing okay  ?Any questions or concerns? No ? ?Items Reviewed: ?Did the pt receive and understand the discharge instructions provided? Yes  ?Medications obtained and verified?  She still has to pick up the flagyl at the pharmacy and the rocephin will be delivered this afternoon. She has all of the other medications and did not have any questions about the med regime.  ?Other? No  ?Any new allergies since your discharge? No  ?Dietary orders reviewed? No ?Do you have support at home? Yes  - her mother ? ?Home Care and Equipment/Supplies: ?Were home health services ordered? no ?If so, what is the name of the agency? N/a  ?Has the agency set up a time to come to the patient's home? not applicable ?Were any new equipment or medical supplies ordered?  Yes: Infusion/ PICC supplies.  Dressing change supplies ?What is the name of the medical supply agency? [employer] - Infusion/ PICC supplies. She was given some dressing supplies for her right foot dressing when she left the hospital and was told that she would need to purchase additional dressing supplies going forward.  ?Were you able to get the supplies/equipment? The infusion medication and supplies will be delivered today at 1530 and the infusion nurse will be coming to see her at that time.  ?Do you have any questions related to the use of the equipment or supplies? No ? ?Functional Questionnaire: (I = Independent and D = Dependent) ?ADLs: requires assistance. Her mother helps as needed.   ?She stated that her mother is doing her foot dressing changes daily as instructed by hospital staff.  ?She has a wheelchair, walker and BSC as well as a glucometer.  ?She confirmed that she is NWB RLE.  ? ? ? ?Follow up appointments reviewed: ? ?PCP Hospital f/u appt confirmed?  Yes  Scheduled to see Gwinda Passe, NP on 03/26/2021.  ?Specialist Hospital f/u appt confirmed? Yes  Scheduled to see wound care - 03/19/2021, [facility] - 03/27/2021.  ?Are transportation arrangements needed? No  -  her mother drives her or she uses [facility].  ?If their condition worsens, is the pt aware to call PCP or go to the Emergency Dept.? Yes ?Was the patient provided with contact information for the PCP's office or ED? Yes ?Was to pt encouraged to call back with questions or concerns? Yes ? ?

## 2021-03-16 ENCOUNTER — Telehealth: Payer: Self-pay

## 2021-03-16 NOTE — Telephone Encounter (Signed)
Message received from [facility], requesting information about possible repair of wheelchair v. Purchasing a new one. ? ?Call placed to [employer] # [phone], spoke to [facility] and she stated that they only service wheelchairs that they have provided to individuals.  ? ?Call placed to [facility] # [phone], they do service wheelchairs that they don't provide. There is a cost of $75 to go to patient's residence to evaluate the wheelchair and provide estimate of cost of repair. The operations representative will call this CM back with more information  ?

## 2021-03-19 ENCOUNTER — Other Ambulatory Visit: Payer: Self-pay

## 2021-03-19 ENCOUNTER — Other Ambulatory Visit: Payer: Self-pay | Admitting: Family Medicine

## 2021-03-19 ENCOUNTER — Other Ambulatory Visit (INDEPENDENT_AMBULATORY_CARE_PROVIDER_SITE_OTHER): Payer: Self-pay

## 2021-03-19 ENCOUNTER — Encounter (HOSPITAL_BASED_OUTPATIENT_CLINIC_OR_DEPARTMENT_OTHER): Payer: Medicaid Other | Attending: Internal Medicine | Admitting: Internal Medicine

## 2021-03-19 DIAGNOSIS — L97514 Non-pressure chronic ulcer of other part of right foot with necrosis of bone: Secondary | ICD-10-CM

## 2021-03-19 DIAGNOSIS — I10 Essential (primary) hypertension: Secondary | ICD-10-CM | POA: Diagnosis not present

## 2021-03-19 DIAGNOSIS — M869 Osteomyelitis, unspecified: Secondary | ICD-10-CM | POA: Diagnosis not present

## 2021-03-19 DIAGNOSIS — Z794 Long term (current) use of insulin: Secondary | ICD-10-CM | POA: Insufficient documentation

## 2021-03-19 DIAGNOSIS — E11621 Type 2 diabetes mellitus with foot ulcer: Secondary | ICD-10-CM | POA: Insufficient documentation

## 2021-03-19 DIAGNOSIS — Z86718 Personal history of other venous thrombosis and embolism: Secondary | ICD-10-CM | POA: Insufficient documentation

## 2021-03-19 DIAGNOSIS — Z7901 Long term (current) use of anticoagulants: Secondary | ICD-10-CM | POA: Diagnosis not present

## 2021-03-19 NOTE — Telephone Encounter (Addendum)
Call received from Kendrick/ [employer]  work cell # [phone]. He explained that it would cost $75 to go to patient's home and assess the status of her wheelchair brakes. If the brakes can be adjusted while he is there, there will be no extra charge.  If extra repairs/parts are needed he will need to obtain an estimate of the cost. ? ?Update sent to [facility] with information noted above and requesting how to proceed at this time.  ?

## 2021-03-19 NOTE — Progress Notes (Signed)
Gaynor, Antha L. ([id number]) Visit Report for 03/19/2021 Arrival Information Details Patient Name: Date of Service: DA [name] IS, [name] [name] 03/19/2021 8:00 A M Medical Record Number: [id number] Patient Account Number: [id number] Date of Birth/Sex: Treating RN: 1980/03/17 (41 y.o. [name] Primary Care Riata Ikeda: Juluis Mire Other Clinician: Referring Lilian Fuhs: Treating Nayla Dias/Extender: Edmonia Lynch in Treatment: 8 Visit Information History Since Last Visit Added or deleted any medications: No Patient Arrived: Wheel Chair Any new allergies or adverse reactions: No Arrival Time: 08:08 Had a fall or experienced change in No Transfer Assistance: Manual activities of daily living that may affect Patient Identification Verified: Yes risk of falls: Secondary Verification Process Completed: Yes Signs or symptoms of abuse/neglect since last visito No Patient Requires Transmission-Based No Hospitalized since last visit: No Precautions: Implantable device outside of the clinic excluding No Patient Has Alerts: Yes cellular tissue based products placed in the center Patient Alerts: Patient on Blood Thinner since last visit: PICC R Arm Has Dressing in Place as Prescribed: Yes ABI 12/17/20 R=1.08 Pain Present Now: Yes L=1.13 Electronic Signature(s) Signed: 03/19/2021 4:42:24 PM By: Lorrin Jackson Entered By: Lorrin Jackson on 03/19/2021 08:12:59 -------------------------------------------------------------------------------- Encounter Discharge Information Details Patient Name: Date of Service: [name], [name] 03/19/2021 8:00 A M Medical Record Number: [id number] Patient Account Number: [id number] Date of Birth/Sex: Treating RN: [birth year] (41 y.o. [name] Primary Care Cashtyn Pouliot: Juluis Mire Other Clinician: Referring Aniya Jolicoeur: Treating Ayris Carano/Extender: Edmonia Lynch in Treatment: 8 Encounter Discharge  Information Items Post Procedure Vitals Discharge Condition: Stable Temperature (F): 98 Ambulatory Status: Wheelchair Pulse (bpm): 93 Discharge Destination: Home Respiratory Rate (breaths/min): 18 Transportation: Other Blood Pressure (mmHg): 112/74 Schedule Follow-up Appointment: Yes Clinical Summary of Care: Provided on 03/19/2021 Form Type Recipient Paper Patient Patient Electronic Signature(s) Signed: 03/19/2021 4:42:24 PM By: Lorrin Jackson Entered By: Lorrin Jackson on 03/19/2021 08:49:57 -------------------------------------------------------------------------------- Lower Extremity Assessment Details Patient Name: Date of Service: [name], [name] 03/19/2021 8:00 A M Medical Record Number: [id number] Patient Account Number: [id number] Date of Birth/Sex: Treating RN: 14-Jun-1980 (41 y.o. [name] Primary Care Dorean Hiebert: Juluis Mire Other Clinician: Referring Candid Bovey: Treating Elmond Poehlman/Extender: Geryl Councilman Weeks in Treatment: 8 Edema Assessment Assessed: [Left: No] [Right: Yes] Edema: [Left: N] [Right: o] Calf Left: Right: Point of Measurement: 34 cm From Medial Instep 45.5 cm Ankle Left: Right: Point of Measurement: 8 cm From Medial Instep 26.2 cm Vascular Assessment Pulses: Dorsalis Pedis Palpable: [Right:Yes] Electronic Signature(s) Signed: 03/19/2021 4:42:24 PM By: Lorrin Jackson Entered By: Lorrin Jackson on 03/19/2021 08:17:34 -------------------------------------------------------------------------------- Multi Wound Chart Details Patient Name: Date of Service: [name], [name] 03/19/2021 8:00 A M Medical Record Number: [id number] Patient Account Number: [id number] Date of Birth/Sex: Treating RN: 09-09-1980 (41 y.o. F) Primary Care Cinthya Bors: Juluis Mire Other Clinician: Referring Jaben Benegas: Treating Krisinda Giovanni/Extender: Edmonia Lynch in Treatment: 8 Vital Signs Height(in):  69 Pulse(bpm): 93 Weight(lbs): Blood Pressure(mmHg): 112/74 Body Mass Index(BMI): Temperature(F): 98 Respiratory Rate(breaths/min): 18 Photos: [1:Right Amputation Site -] [N/A:N/A N/A] Wound Location: [1:Transmetatarsal Surgical Injury] [N/A:N/A] Wounding Event: [1:Diabetic Wound/Ulcer of the Lower] [N/A:N/A] Primary Etiology: [1:Extremity Hypertension, Type II Diabetes,] [N/A:N/A] Comorbid History: [1:Osteomyelitis, Neuropathy 12/15/2020] [N/A:N/A] Date Acquired: [1:8] [N/A:N/A] Weeks of Treatment: [1:Open] [N/A:N/A] Wound Status: [1:No] [N/A:N/A] Wound Recurrence: [1:2x6.4x0.3] [N/A:N/A] Measurements L x W x D (cm) [1:10.053] [N/A:N/A] A (cm) : rea [1:3.016] [N/A:N/A] Volume (cm) : [1:76.40%] [N/A:N/A] % Reduction in A [1:rea: 95.60%] [N/A:N/A] %  Reduction in Volume: [1:5] Position 1 (o'clock): [1:3.7] Maximum Distance 1 (cm): [1:Yes] [N/A:N/A] Tunneling: [1:Grade 3] [N/A:N/A] Classification: [1:Medium] [N/A:N/A] Exudate A mount: [1:Serosanguineous] [N/A:N/A] Exudate Type: [1:red, brown] [N/A:N/A] Exudate Color: [1:Thickened] [N/A:N/A] Wound Margin: [1:Medium (34-66%)] [N/A:N/A] Granulation A mount: [1:Red, Pink] [N/A:N/A] Granulation Quality: [1:Medium (34-66%)] [N/A:N/A] Necrotic A mount: [1:Fat Layer (Subcutaneous Tissue): Yes N/A] Exposed Structures: [1:Fascia: No Tendon: No Muscle: No Joint: No Bone: No None] [N/A:N/A] Epithelialization: [1:Debridement - Excisional] [N/A:N/A] Debridement: Pre-procedure Verification/Time Out 08:24 [N/A:N/A] Taken: [1:Other] [N/A:N/A] Pain Control: [1:Callus, Subcutaneous, Slough] [N/A:N/A] Tissue Debrided: [1:Skin/Subcutaneous Tissue] [N/A:N/A] Level: [1:12.8] [N/A:N/A] Debridement A (sq cm): [1:rea Curette] [N/A:N/A] Instrument: [1:Minimum] [N/A:N/A] Bleeding: [1:Pressure] [N/A:N/A] Hemostasis A chieved: [1:Procedure was tolerated well] [N/A:N/A] Debridement Treatment Response: [1:2x6.4x0.3] [N/A:N/A] Post Debridement  Measurements L x W x D (cm) [1:3.016] [N/A:N/A] Post Debridement Volume: (cm) [1:Debridement] [N/A:N/A] Treatment Notes Electronic Signature(s) Signed: 03/19/2021 8:44:12 AM By: Kalman Shan DO Entered By: Kalman Shan on 03/19/2021 08:36:01 -------------------------------------------------------------------------------- Multi-Disciplinary Care Plan Details Patient Name: Date of Service: [name], [name] 03/19/2021 8:00 A M Medical Record Number: [id number] Patient Account Number: [id number] Date of Birth/Sex: Treating RN: April 08, 1980 (41 y.o. [name] Primary Care Atiba Kimberlin: Juluis Mire Other Clinician: Referring Larri Yehle: Treating Jozlin Bently/Extender: Edmonia Lynch in Treatment: 8 Active Inactive Nutrition Nursing Diagnoses: Impaired glucose control: actual or potential Goals: Patient/caregiver verbalizes understanding of need to maintain therapeutic glucose control per primary care physician Date Initiated: 01/22/2021 Target Resolution Date: 04/16/2021 Goal Status: Active Interventions: Assess HgA1c results as ordered upon admission and as needed Provide education on elevated blood sugars and impact on wound healing Treatment Activities: Obtain HgA1c : 01/22/2021 Notes: Wound/Skin Impairment Nursing Diagnoses: Impaired tissue integrity Goals: Patient/caregiver will verbalize understanding of skin care regimen Date Initiated: 01/22/2021 Target Resolution Date: 04/16/2021 Goal Status: Active Ulcer/skin breakdown will have a volume reduction of 30% by week 4 Date Initiated: 01/22/2021 Date Inactivated: 03/19/2021 Target Resolution Date: 03/16/2021 Goal Status: Met Ulcer/skin breakdown will have a volume reduction of 50% by week 8 Date Initiated: 03/19/2021 Target Resolution Date: 04/16/2021 Goal Status: Active Interventions: Assess patient/caregiver ability to obtain necessary supplies Assess patient/caregiver ability to perform  ulcer/skin care regimen upon admission and as needed Assess ulceration(s) every visit Provide education on ulcer and skin care Treatment Activities: Topical wound management initiated : 01/22/2021 Notes: Electronic Signature(s) Signed: 03/19/2021 4:42:24 PM By: Lorrin Jackson Entered By: Lorrin Jackson on 03/19/2021 09:15:07 -------------------------------------------------------------------------------- Pain Assessment Details Patient Name: Date of Service: [name], [name] 03/19/2021 8:00 A M Medical Record Number: [id number] Patient Account Number: [id number] Date of Birth/Sex: Treating RN: 06-Dec-1980 (41 y.o. [name] Primary Care Faryn Sieg: Juluis Mire Other Clinician: Referring Jake Goodson: Treating Tollie Canada/Extender: Edmonia Lynch in Treatment: 8 Active Problems Location of Pain Severity and Description of Pain Patient Has Paino Yes Site Locations With Dressing Change: Yes Duration of the Pain. Constant / Intermittento Intermittent Rate the pain. Current Pain Level: 2 Character of Pain Describe the Pain: Tender, Throbbing Pain Management and Medication Current Pain Management: Medication: Yes Cold Application: No Rest: Yes Massage: No Activity: No T.E.N.S.: No Heat Application: No Leg drop or elevation: No Is the Current Pain Management Adequate: Adequate How does your wound impact your activities of daily livingo Sleep: No Bathing: No Appetite: No Relationship With Others: No Bladder Continence: No Emotions: No Bowel Continence: No Work: No Toileting: No Drive: No Dressing: No Hobbies: No Electronic Signature(s) Signed: 03/19/2021 4:42:24 PM By: Onnie Boer,  Lenna Sciara By: Lorrin Jackson on 03/19/2021 08:15:00 -------------------------------------------------------------------------------- Patient/Caregiver Education Details Patient Name: Date of Service: [name], [name] 3/6/2023andnbsp8:00 A M Medical Record  Number: [id number] Patient Account Number: [id number] Date of Birth/Gender: Treating RN: 12-15-1980 (41 y.o. [name] Primary Care Physician: Juluis Mire Other Clinician: Referring Physician: Treating Physician/Extender: Edmonia Lynch in Treatment: 8 Education Assessment Education Provided To: Patient Education Topics Provided Elevated Blood Sugar/ Impact on Healing: Methods: Explain/Verbal, Printed Responses: State content correctly Wound/Skin Impairment: Methods: Explain/Verbal, Printed Responses: State content correctly Electronic Signature(s) Signed: 03/19/2021 4:42:24 PM By: Lorrin Jackson Entered By: Lorrin Jackson on 03/19/2021 08:08:04 -------------------------------------------------------------------------------- Wound Assessment Details Patient Name: Date of Service: [name], [name] 03/19/2021 8:00 A M Medical Record Number: [id number] Patient Account Number: [id number] Date of Birth/Sex: Treating RN: 05/02/1980 (41 y.o. 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 Wound Status: Open Wounding Event: Surgical Injury Comorbid Hypertension, Type II Diabetes, Osteomyelitis, History: Neuropathy Date Acquired: 12/15/2020 Weeks Of Treatment: 8 Clustered Wound: No Photos Wound Measurements Length: (cm) 2 Width: (cm) 6.4 Depth: (cm) 0.3 Area: (cm) 10.053 Volume: (cm) 3.016 % Reduction in Area: 76.4% % Reduction in Volume: 95.6% Epithelialization: None Tunneling: Yes Position (o'clock): 5 Maximum Distance: (cm) 3.7 Undermining: No Wound Description Classification: Grade 3 Wound Margin: Thickened Exudate Amount: Medium Exudate Type:  Serosanguineous Exudate Color: red, brown Foul Odor After Cleansing: No Slough/Fibrino Yes Wound Bed Granulation Amount: Medium (34-66%) Exposed Structure Granulation Quality: Red, Pink Fascia Exposed: No Necrotic Amount: Medium (34-66%) Fat Layer (Subcutaneous Tissue) Exposed: Yes Necrotic Quality: Adherent Slough Tendon Exposed: No Muscle Exposed: No Joint Exposed: No Bone Exposed: No Treatment Notes Wound #1 (Amputation Site - Transmetatarsal) Wound Laterality: Right Cleanser Normal Saline Discharge Instruction: Cleanse the wound with Normal Saline prior to applying a clean dressing using gauze sponges, not tissue or cotton balls. Peri-Wound Care Topical Dakin's solution Discharge Instruction: Moisten Gauze with Dakins Primary Dressing Iodoform packing strip 1/4 (in) Discharge Instruction: Lightly pack into tunnel Secondary Dressing Woven Gauze Sponge, Non-Sterile 4x4 in Discharge Instruction: Apply over primary dressing as directed. ABD Pad, 5x9 Discharge Instruction: Apply over primary dressing as directed. Secured With Elastic Bandage 4 inch (ACE bandage) Discharge Instruction: Secure with ACE bandage as directed. Kerlix Roll Sterile, 4.5x3.1 (in/yd) Discharge Instruction: Secure with Kerlix as directed. Transpore Surgical Tape, 2x10 (in/yd) Discharge Instruction: Secure dressing with tape as directed. Compression Wrap Compression Stockings Add-Ons Electronic Signature(s) Signed: 03/19/2021 4:42:24 PM By: Lorrin Jackson Entered By: Lorrin Jackson on 03/19/2021 08:20:29 -------------------------------------------------------------------------------- Vitals Details Patient Name: Date of Service: [name], [name] 03/19/2021 8:00 A M Medical Record Number: [id number] Patient Account Number: [id number] Date of Birth/Sex: Treating RN: 06/21/1980 (41 y.o. [name] Primary Care Mane Consolo: Juluis Mire Other Clinician: Referring Zeno Hickel: Treating  Jurni Cesaro/Extender: Edmonia Lynch in Treatment: 8 Vital Signs Time Taken: 08:14 Temperature (F): 98 Height (in): 69 Pulse (bpm): 93 Respiratory Rate (breaths/min): 18 Blood Pressure (mmHg): 112/74 Reference Range: 80 - 120 mg / dl Electronic Signature(s) Signed: 03/19/2021 4:42:24 PM By: Lorrin Jackson Entered By: Lorrin Jackson on 03/19/2021 79:89:21

## 2021-03-19 NOTE — Progress Notes (Signed)
[name], [name] ([id number]) Visit Report for 03/19/2021 Chief Complaint Document Details Patient Name: Date of Service: [name] IS, [name] 03/19/2021 8:00 A M Medical Record Number: [id number] Patient Account Number: [id number] Date of Birth/Sex: Treating RN: [birth year] (41 y.o. F) Primary Care Provider: Gwinda Passe Other Clinician: Referring Provider: Treating Provider/Extender: Grace Isaac in Treatment: 8 Information Obtained from: Patient Chief Complaint Osteomyelitis of the right foot status post transmetatarsal amputation with surgical site dehiscence Electronic Signature(s) Signed: 03/19/2021 8:44:12 AM By: Geralyn Corwin DO Entered By: Geralyn Corwin on 03/19/2021 08:36:21 -------------------------------------------------------------------------------- Debridement Details Patient Name: Date of Service: [name], [name] 03/19/2021 8:00 A M Medical Record Number: [id number] Patient Account Number: [id number] Date of Birth/Sex: Treating RN: Jan 13, 1981 (41 y.o. [name] Primary Care Provider: Gwinda Passe Other Clinician: Referring Provider: Treating Provider/Extender: Grace Isaac in Treatment: 8 Debridement Performed for Assessment: Wound #1 Right Amputation Site - Transmetatarsal Performed By: Physician Geralyn Corwin, DO Debridement Type: Debridement Severity of Tissue Pre Debridement: Fat layer exposed Level of Consciousness (Pre-procedure): Awake and Alert Pre-procedure Verification/Time Out Yes - 08:24 Taken: Start Time: 08:25 Pain Control: Other : Benzocaine T Area Debrided (L x W): otal 2 (cm) x 6.4 (cm) = 12.8 (cm) Tissue and other material debrided: Callus, Slough, Subcutaneous, Slough Level: Skin/Subcutaneous Tissue Debridement Description: Excisional Instrument: Curette Bleeding: Minimum Hemostasis Achieved: Pressure End Time: 08:31 Response to Treatment: Procedure was  tolerated well Level of Consciousness (Post- Awake and Alert procedure): Post Debridement Measurements of Total Wound Length: (cm) 2 Width: (cm) 6.4 Depth: (cm) 0.3 Volume: (cm) 3.016 Character of Wound/Ulcer Post Debridement: Stable Severity of Tissue Post Debridement: Fat layer exposed Post Procedure Diagnosis Same as Pre-procedure Electronic Signature(s) Signed: 03/19/2021 8:44:12 AM By: Geralyn Corwin DO Signed: 03/19/2021 4:42:24 PM By: Antonieta Iba Entered By: Antonieta Iba on 03/19/2021 08:32:01 -------------------------------------------------------------------------------- HPI Details Patient Name: Date of Service: [name], [name] 03/19/2021 8:00 A M Medical Record Number: [id number] Patient Account Number: [id number] Date of Birth/Sex: Treating RN: Apr 30, 1980 (41 y.o. F) Primary Care Provider: Gwinda Passe Other Clinician: Referring Provider: Treating Provider/Extender: Grace Isaac in Treatment: 8 History of Present Illness HPI Description: Admission 01/22/2021 [name] is a 41 year old female with a past medical history of insulin-dependent uncontrolled type 2 diabetes with last hemoglobin A1c of 13.5, osteomyelitis of the right foot status post transmetatarsal amputation on 12/18/2020 that presents to the clinic for right foot wound. She has had dehiscence of the surgical site. She is currently using wet-to-dry dressings. She has a PICC line and receiving IV ceftriaxone daily for her osteomyelitis. There is an end date of 01/27/2021. She is also taking oral metronidazole. She currently denies systemic signs of infection. 1/19; patient presents for follow-up. She was diagnosed with a DVT to the right lower extremity 2 days ago. She is on Eliquis now. She is scheduled to see her infectious disease doctor tomorrow. She has been using Dakin's wet-to-dry dressings. She denies systemic signs of infection. 1/26; patient presents for  follow-up. She saw infectious disease on 1/21 started on Augmentin. Her PICC line and IV ceftriaxone was discontinued. Patient reports stability to her wound. She has been using Dakin's wet-to-dry dressings. She currently denies systemic signs of infection. 2/3; patient presents for follow-up. She continues to use Dakin's wet-to-dry dressings to the wound bed. She saw Dr. Manson Passey with infectious disease yesterday and is continuing Augmentin. T entative end date is 2/16.  Patient reports following up with orthopedics. She states there is no further plan from them. She currently denies systemic signs of infection. 2/10; patient presents for follow-up. She continues to use Dakin's wet to dry dressings. She is scheduled to have her MRI done on 2/14. She states that she had pain to the debridement site from last clinic visit and declines debridement today. She denies systemic signs of infection. She continues to have yellow thick drainage. 2/20; patient presents for follow-up. She continues to use Dakin's wet-to-dry dressings. She obtained her MRI. The results showed an abscess and she is scheduled to see her orthopedic surgeon on 2/23. She saw infectious disease 2/17 and her antibiotics were extended. She currently denies systemic signs of infection. 3/6; patient presents for follow-up. She had debridement and irrigation of her foot on 03/10/2021 due to abscess noted on MRI. She was started on IV ceftriaxone and oral Flagyl. She has no issues or complaints today. She has been using iodoform packing to the tunnel and Dakin's wet-to-dry to the opening. Electronic Signature(s) Signed: 03/19/2021 8:44:12 AM By: Geralyn Corwin DO Entered By: Geralyn Corwin on 03/19/2021 08:39:57 -------------------------------------------------------------------------------- Physical Exam Details Patient Name: Date of Service: [name], [name] 03/19/2021 8:00 A M Medical Record Number: [id number] Patient Account Number:  [id number] Date of Birth/Sex: Treating RN: 1980/03/30 (41 y.o. F) Primary Care Provider: Gwinda Passe Other Clinician: Referring Provider: Treating Provider/Extender: Grace Isaac in Treatment: 8 Constitutional respirations regular, non-labored and within target range for patient.. Cardiovascular 2+ dorsalis pedis/posterior tibialis pulses. Psychiatric pleasant and cooperative. Notes Right foot: Transmetatarsal surgical wound site has completely dehisced. At the opening there is granulation tissue and nonviable tissue. There is a significant tunnel at the lateral aspect with serosanguineous drainage. No purulent drainage noted. Electronic Signature(s) Signed: 03/19/2021 8:44:12 AM By: Geralyn Corwin DO Entered By: Geralyn Corwin on 03/19/2021 08:41:10 -------------------------------------------------------------------------------- Physician Orders Details Patient Name: Date of Service: [name], [name] 03/19/2021 8:00 A M Medical Record Number: [id number] Patient Account Number: [id number] Date of Birth/Sex: Treating RN: [birth year] (41 y.o. [name] Primary Care Provider: Gwinda Passe Other Clinician: Referring Provider: Treating Provider/Extender: Grace Isaac in Treatment: 8 Verbal / Phone Orders: No Diagnosis Coding ICD-10 Coding Code Description L97.514 Non-pressure chronic ulcer of other part of right foot with necrosis of bone E11.621 Type 2 diabetes mellitus with foot ulcer M86.9 Osteomyelitis, unspecified Follow-up Appointments ppointment in 1 week. - with Dr. Mikey Bussing (Room 7, [place]) Return A Other: - Follow up with Infectious Disease 03/27/2021 Bathing/ Shower/ Hygiene Other Bathing/Shower/Hygiene Orders/Instructions: - Clean with Saline or Dakins Edema Control - Lymphedema / SCD / Other Elevate legs to the level of the heart or above for 30 minutes daily and/or when sitting, a frequency of:  - throughout the day Avoid standing for long periods of time. Moisturize legs daily. Additional Orders / Instructions Follow Nutritious Diet - -Monitor/Control Blood Sugar -High Protein Diet Wound Treatment Wound #1 - Amputation Site - Transmetatarsal Wound Laterality: Right Cleanser: Normal Saline 2 x Per Day/15 Days Discharge Instructions: Cleanse the wound with Normal Saline prior to applying a clean dressing using gauze sponges, not tissue or cotton balls. Topical: Dakin's solution 2 x Per Day/15 Days Discharge Instructions: Moisten Gauze with Dakins Prim Dressing: Iodoform packing strip 1/4 (in) 2 x Per Day/15 Days ary Discharge Instructions: Lightly pack into tunnel Secondary Dressing: Woven Gauze Sponge, Non-Sterile 4x4 in 2 x Per Day/15 Days Discharge Instructions: Apply  over primary dressing as directed. Secondary Dressing: ABD Pad, 5x9 2 x Per Day/15 Days Discharge Instructions: Apply over primary dressing as directed. Secured With: Elastic Bandage 4 inch (ACE bandage) 2 x Per Day/15 Days Discharge Instructions: Secure with ACE bandage as directed. Secured With: [employer], 4.5x3.1 (in/yd) 2 x Per Day/15 Days Discharge Instructions: Secure with Kerlix as directed. Secured With: Transpore Surgical Tape, 2x10 (in/yd) 2 x Per Day/15 Days Discharge Instructions: Secure dressing with tape as directed. Electronic Signature(s) Signed: 03/19/2021 8:44:12 AM By: Geralyn Corwin DO Entered By: Geralyn Corwin on 03/19/2021 08:41:27 -------------------------------------------------------------------------------- Problem List Details Patient Name: Date of Service: [name], [name] 03/19/2021 8:00 A M Medical Record Number: [id number] Patient Account Number: [id number] Date of Birth/Sex: Treating RN: 1981-01-02 (41 y.o. [name] Primary Care Provider: Gwinda Passe Other Clinician: Referring Provider: Treating Provider/Extender: Grace Isaac in Treatment: 8 Active Problems ICD-10 Encounter Code Description Active Date MDM Diagnosis L97.514 Non-pressure chronic ulcer of other part of right foot with necrosis of bone 01/22/2021 No Yes E11.621 Type 2 diabetes mellitus with foot ulcer 01/22/2021 No Yes M86.9 Osteomyelitis, unspecified 01/22/2021 No Yes Inactive Problems Resolved Problems Electronic Signature(s) Signed: 03/19/2021 8:44:12 AM By: Geralyn Corwin DO Entered By: Geralyn Corwin on 03/19/2021 08:35:55 -------------------------------------------------------------------------------- Progress Note Details Patient Name: Date of Service: [name], [name] 03/19/2021 8:00 A M Medical Record Number: [id number] Patient Account Number: [id number] Date of Birth/Sex: Treating RN: [birth year] (41 y.o. F) Primary Care Provider: Gwinda Passe Other Clinician: Referring Provider: Treating Provider/Extender: Grace Isaac in Treatment: 8 Subjective Chief Complaint Information obtained from Patient Osteomyelitis of the right foot status post transmetatarsal amputation with surgical site dehiscence History of Present Illness (HPI) Admission 01/22/2021 Ms. Tenika Arcari is a 41 year old female with a past medical history of insulin-dependent uncontrolled type 2 diabetes with last hemoglobin A1c of 13.5, osteomyelitis of the right foot status post transmetatarsal amputation on 12/18/2020 that presents to the clinic for right foot wound. She has had dehiscence of the surgical site. She is currently using wet-to-dry dressings. She has a PICC line and receiving IV ceftriaxone daily for her osteomyelitis. There is an end date of 01/27/2021. She is also taking oral metronidazole. She currently denies systemic signs of infection. 1/19; patient presents for follow-up. She was diagnosed with a DVT to the right lower extremity 2 days ago. She is on Eliquis now. She is scheduled to see her infectious  disease doctor tomorrow. She has been using Dakin's wet-to-dry dressings. She denies systemic signs of infection. 1/26; patient presents for follow-up. She saw infectious disease on 1/21 started on Augmentin. Her PICC line and IV ceftriaxone was discontinued. Patient reports stability to her wound. She has been using Dakin's wet-to-dry dressings. She currently denies systemic signs of infection. 2/3; patient presents for follow-up. She continues to use Dakin's wet-to-dry dressings to the wound bed. She saw Dr. Manson Passey with infectious disease yesterday and is continuing Augmentin. T entative end date is 2/16. Patient reports following up with orthopedics. She states there is no further plan from them. She currently denies systemic signs of infection. 2/10; patient presents for follow-up. She continues to use Dakin's wet to dry dressings. She is scheduled to have her MRI done on 2/14. She states that she had pain to the debridement site from last clinic visit and declines debridement today. She denies systemic signs of infection. She continues to have yellow thick drainage. 2/20; patient presents  for follow-up. She continues to use Dakin's wet-to-dry dressings. She obtained her MRI. The results showed an abscess and she is scheduled to see her orthopedic surgeon on 2/23. She saw infectious disease 2/17 and her antibiotics were extended. She currently denies systemic signs of infection. 3/6; patient presents for follow-up. She had debridement and irrigation of her foot on 03/10/2021 due to abscess noted on MRI. She was started on IV ceftriaxone and oral Flagyl. She has no issues or complaints today. She has been using iodoform packing to the tunnel and Dakin's wet-to-dry to the opening. Patient History Information obtained from Patient. Family History Cancer - Paternal Grandparents, Diabetes - Mother, Hypertension - Mother, Stroke - Maternal Grandparents, No family history of Heart Disease, Hereditary  Spherocytosis, Kidney Disease, Lung Disease, Seizures, Thyroid Problems, Tuberculosis. Social History Never smoker, Marital Status - Single, Alcohol Use - Rarely, Drug Use - Prior History - Marijuana, Caffeine Use - Daily. Medical History Cardiovascular Patient has history of Hypertension Endocrine Patient has history of Type II Diabetes Musculoskeletal Patient has history of Osteomyelitis - Right Transmet 12/18/20 Neurologic Patient has history of Neuropathy Objective Constitutional respirations regular, non-labored and within target range for patient.. Vitals Time Taken: 8:14 AM, Height: 69 in, Temperature: 98 F, Pulse: 93 bpm, Respiratory Rate: 18 breaths/min, Blood Pressure: 112/74 mmHg. Cardiovascular 2+ dorsalis pedis/posterior tibialis pulses. Psychiatric pleasant and cooperative. General Notes: Right foot: Transmetatarsal surgical wound site has completely dehisced. At the opening there is granulation tissue and nonviable tissue. There is a significant tunnel at the lateral aspect with serosanguineous drainage. No purulent drainage noted. Integumentary (Hair, Skin) Wound #1 status is Open. Original cause of wound was Surgical Injury. The date acquired was: 12/15/2020. The wound has been in treatment 8 weeks. The wound is located on the Right Amputation Site - Transmetatarsal. The wound measures 2cm length x 6.4cm width x 0.3cm depth; 10.053cm^2 area and 3.016cm^3 volume. There is Fat Layer (Subcutaneous Tissue) exposed. There is no undermining noted, however, there is tunneling at 5:00 with a maximum distance of 3.7cm. There is a medium amount of serosanguineous drainage noted. The wound margin is thickened. There is medium (34-66%) red, pink granulation within the wound bed. There is a medium (34-66%) amount of necrotic tissue within the wound bed including Adherent Slough. Assessment Active Problems ICD-10 Non-pressure chronic ulcer of other part of right foot with necrosis  of bone Type 2 diabetes mellitus with foot ulcer Osteomyelitis, unspecified Patient's wound has shown improvement in overall appearance since her surgery. She is on IV ceftriaxone and oral Flagyl. I debrided nonviable tissue. I recommended continue with iodoform packing to the lateral wound and Dakin's wet-to-dry to the dehisced surgical site. No signs of surrounding infection. Continue aggressive offloading. Follow-up in 1 week. Procedures Wound #1 Pre-procedure diagnosis of Wound #1 is a Diabetic Wound/Ulcer of the Lower Extremity located on the Right Amputation Site - Transmetatarsal .Severity of Tissue Pre Debridement is: Fat layer exposed. There was a Excisional Skin/Subcutaneous Tissue Debridement with a total area of 12.8 sq cm performed by Geralyn CorwinHoffman, Mekenzie Modeste, DO. With the following instrument(s): Curette Material removed includes Callus, Subcutaneous Tissue, and Slough after achieving pain control using Other (Benzocaine). No specimens were taken. A time out was conducted at 08:24, prior to the start of the procedure. A Minimum amount of bleeding was controlled with Pressure. The procedure was tolerated well. Post Debridement Measurements: 2cm length x 6.4cm width x 0.3cm depth; 3.016cm^3 volume. Character of Wound/Ulcer Post Debridement is stable. Severity of Tissue  Post Debridement is: Fat layer exposed. Post procedure Diagnosis Wound #1: Same as Pre-Procedure Plan Follow-up Appointments: Return Appointment in 1 week. - with Dr. Mikey Bussing (Room 7, [place]) Other: - Follow up with Infectious Disease 03/27/2021 Bathing/ Shower/ Hygiene: Other Bathing/Shower/Hygiene Orders/Instructions: - Clean with Saline or Dakins Edema Control - Lymphedema / SCD / Other: Elevate legs to the level of the heart or above for 30 minutes daily and/or when sitting, a frequency of: - throughout the day Avoid standing for long periods of time. Moisturize legs daily. Additional Orders / Instructions: Follow  Nutritious Diet - -Monitor/Control Blood Sugar -High Protein Diet WOUND #1: - Amputation Site - Transmetatarsal Wound Laterality: Right Cleanser: Normal Saline 2 x Per Day/15 Days Discharge Instructions: Cleanse the wound with Normal Saline prior to applying a clean dressing using gauze sponges, not tissue or cotton balls. Topical: Dakin's solution 2 x Per Day/15 Days Discharge Instructions: Moisten Gauze with Dakins Prim Dressing: Iodoform packing strip 1/4 (in) 2 x Per Day/15 Days ary Discharge Instructions: Lightly pack into tunnel Secondary Dressing: Woven Gauze Sponge, Non-Sterile 4x4 in 2 x Per Day/15 Days Discharge Instructions: Apply over primary dressing as directed. Secondary Dressing: ABD Pad, 5x9 2 x Per Day/15 Days Discharge Instructions: Apply over primary dressing as directed. Secured With: Elastic Bandage 4 inch (ACE bandage) 2 x Per Day/15 Days Discharge Instructions: Secure with ACE bandage as directed. Secured With: [employer], 4.5x3.1 (in/yd) 2 x Per Day/15 Days Discharge Instructions: Secure with Kerlix as directed. Secured With: Transpore Surgical T ape, 2x10 (in/yd) 2 x Per Day/15 Days Discharge Instructions: Secure dressing with tape as directed. 1. In office sharp debridement 2. Dakin's wet-to-dry 3. Iodoform packing 4. Aggressive offloading 5. Follow-up in 1 week Electronic Signature(s) Signed: 03/19/2021 8:44:12 AM By: Geralyn Corwin DO Entered By: Geralyn Corwin on 03/19/2021 08:43:19 -------------------------------------------------------------------------------- HxROS Details Patient Name: Date of Service: [name], [name] 03/19/2021 8:00 A M Medical Record Number: [id number] Patient Account Number: [id number] Date of Birth/Sex: Treating RN: [birth year] (41 y.o. F) Primary Care Provider: Gwinda Passe Other Clinician: Referring Provider: Treating Provider/Extender: Grace Isaac in Treatment: 8 Information  Obtained From Patient Cardiovascular Medical History: Positive for: Hypertension Endocrine Medical History: Positive for: Type II Diabetes Time with diabetes: Dx 2009 Treated with: Insulin, Oral agents Blood sugar tested every day: Yes Tested : daily Musculoskeletal Medical History: Positive for: Osteomyelitis - Right Transmet 12/18/20 Neurologic Medical History: Positive for: Neuropathy Immunizations Pneumococcal Vaccine: Received Pneumococcal Vaccination: No Implantable Devices Yes Family and Social History Cancer: Yes - Paternal Grandparents; Diabetes: Yes - Mother; Heart Disease: No; Hereditary Spherocytosis: No; Hypertension: Yes - Mother; Kidney Disease: No; Lung Disease: No; Seizures: No; Stroke: Yes - Maternal Grandparents; Thyroid Problems: No; Tuberculosis: No; Never smoker; Marital Status - Single; Alcohol Use: Rarely; Drug Use: Prior History - Marijuana; Caffeine Use: Daily; Financial Concerns: No; Food, Clothing or Shelter Needs: No; Support System Lacking: No; Transportation Concerns: No Electronic Signature(s) Signed: 03/19/2021 8:44:12 AM By: Geralyn Corwin DO Entered By: Geralyn Corwin on 03/19/2021 08:40:06 -------------------------------------------------------------------------------- SuperBill Details Patient Name: Date of Service: [name], [name] 03/19/2021 Medical Record Number: [id number] Patient Account Number: [id number] Date of Birth/Sex: Treating RN: 24-Mar-1980 (41 y.o. [name] Primary Care Provider: Gwinda Passe Other Clinician: Referring Provider: Treating Provider/Extender: Grace Isaac in Treatment: 8 Diagnosis Coding ICD-10 Codes Code Description [phone] Non-pressure chronic ulcer of other part of right foot with necrosis of bone E11.621 Type  2 diabetes mellitus with foot ulcer M86.9 Osteomyelitis, unspecified Facility Procedures CPT4 Code: [id number] Description: 11042 - DEB SUBQ TISSUE  20 SQ CM/< ICD-10 Diagnosis Description L97.514 Non-pressure chronic ulcer of other part of right foot with necrosis of bone Modifier: Quantity: 1 Physician Procedures : CPT4 Code Description Modifier [id number] 11042 - WC PHYS SUBQ TISS 20 SQ CM ICD-10 Diagnosis Description L97.514 Non-pressure chronic ulcer of other part of right foot with necrosis of bone Quantity: 1 Electronic Signature(s) Signed: 03/19/2021 8:44:12 AM By: Geralyn CorwinHoffman, Rashawna Scoles DO Entered By: Geralyn CorwinHoffman, Malcolm Hetz on 03/19/2021 08:43:41

## 2021-03-19 NOTE — Telephone Encounter (Signed)
Message received from [facility] # [phone] regarding evaluation of the brakes on patient's wheelchair. Call back requested.  ?This CM called back and had to leave a message for him to please return the call.  ?

## 2021-03-20 ENCOUNTER — Other Ambulatory Visit: Payer: Self-pay

## 2021-03-20 MED FILL — Gabapentin Cap 300 MG: ORAL | 30 days supply | Qty: 180 | Fill #0 | Status: AC

## 2021-03-20 NOTE — Telephone Encounter (Signed)
Requested Prescriptions  ?Pending Prescriptions Disp Refills  ?? gabapentin (NEURONTIN) 300 MG capsule 180 capsule 0  ?  Sig: Take 2 capsules by mouth 3 times daily.  ?  ? Neurology: Anticonvulsants - gabapentin Passed - 03/19/2021 10:36 AM  ?  ?  Passed - Cr in normal range and within 360 days  ?  Creat  ?Date Value Ref Range Status  ?02/15/2021 0.71 0.50 - 0.97 mg/dL Final  ? ?Creatinine, Ser  ?Date Value Ref Range Status  ?03/13/2021 0.68 0.44 - 1.00 mg/dL Final  ?   ?  ?  Passed - Completed PHQ-2 or PHQ-9 in the last 360 days  ?  ?  Passed - Valid encounter within last 12 months  ?  Recent Outpatient Visits   ?      ? 1 month ago Type 2 diabetes mellitus with hyperglycemia, with long-term current use of insulin ([place])  ? [place], RPH-CPP  ? 1 month ago Hospital discharge follow-up  ? [facility] Kerin Perna, NP  ? 2 months ago Type 2 diabetes mellitus with hyperglycemia, unspecified whether long term insulin use ([place])  ? [facility] Kerin Perna, NP  ?  ?  ?Future Appointments   ?        ? In 6 days Kerin Perna, NP [place]  ? In 1 week Rosiland Oz, MD [facility]  ?  ? ?  ?  ?  ? ? ?

## 2021-03-21 ENCOUNTER — Other Ambulatory Visit: Payer: Self-pay

## 2021-03-26 ENCOUNTER — Other Ambulatory Visit: Payer: Self-pay

## 2021-03-26 ENCOUNTER — Encounter (HOSPITAL_BASED_OUTPATIENT_CLINIC_OR_DEPARTMENT_OTHER): Payer: Medicaid Other | Admitting: General Surgery

## 2021-03-26 ENCOUNTER — Encounter (INDEPENDENT_AMBULATORY_CARE_PROVIDER_SITE_OTHER): Payer: Self-pay | Admitting: Primary Care

## 2021-03-26 ENCOUNTER — Ambulatory Visit (INDEPENDENT_AMBULATORY_CARE_PROVIDER_SITE_OTHER): Payer: Medicaid Other | Admitting: Primary Care

## 2021-03-26 VITALS — BP 109/71 | HR 95 | Temp 98.2°F | Ht 69.0 in

## 2021-03-26 DIAGNOSIS — L97514 Non-pressure chronic ulcer of other part of right foot with necrosis of bone: Secondary | ICD-10-CM | POA: Diagnosis not present

## 2021-03-26 DIAGNOSIS — E1165 Type 2 diabetes mellitus with hyperglycemia: Secondary | ICD-10-CM

## 2021-03-26 LAB — POCT GLYCOSYLATED HEMOGLOBIN (HGB A1C): Hemoglobin A1C: 8.3 % — AB (ref 4.0–5.6)

## 2021-03-26 NOTE — Progress Notes (Signed)
[name], [name] ([id number]) Visit Report for 03/26/2021 Chief Complaint Document Details Patient Name: Date of Service: [name] IS, [place] [name] 03/26/2021 11:00 A M Medical Record Number: [id number] Patient Account Number: [id number] Date of Birth/Sex: Treating RN: 06-14-1980 (41 y.o. F) Primary Care Provider: Gwinda Passe Other Clinician: Referring Provider: Treating Provider/Extender: Sharyl Nimrod in Treatment: 9 Information Obtained from: Patient Chief Complaint Osteomyelitis of the right foot status post transmetatarsal amputation with surgical site dehiscence Electronic Signature(s) Signed: 03/26/2021 11:57:05 AM By: Duanne Guess MD FACS Entered By: Duanne Guess on 03/26/2021 11:57:04 -------------------------------------------------------------------------------- Debridement Details Patient Name: Date of Service: [name], [name] 03/26/2021 11:00 A M Medical Record Number: [id number] Patient Account Number: [id number] Date of Birth/Sex: Treating RN: Jul 17, 1980 (41 y.o. [name] Primary Care Provider: Gwinda Passe Other Clinician: Referring Provider: Treating Provider/Extender: Sharyl Nimrod in Treatment: 9 Debridement Performed for Assessment: Wound #1 Right Amputation Site - Transmetatarsal Performed By: Physician Duanne Guess, MD Debridement Type: Debridement Severity of Tissue Pre Debridement: Fat layer exposed Level of Consciousness (Pre-procedure): Awake and Alert Pre-procedure Verification/Time Out Yes - 11:39 Taken: Start Time: 11:40 Pain Control: Other : Benzocaine T Area Debrided (L x W): otal 1.8 (cm) x 4 (cm) = 7.2 (cm) Tissue and other material debrided: Non-Viable, Fat, Slough, Slough Level: Skin/Subcutaneous Tissue Debridement Description: Excisional Instrument: Curette Bleeding: Minimum Hemostasis Achieved: Pressure End Time: 11:46 Response to Treatment: Procedure was  tolerated well Level of Consciousness (Post- Awake and Alert procedure): Post Debridement Measurements of Total Wound Length: (cm) 1.8 Width: (cm) 6.8 Depth: (cm) 0.4 Volume: (cm) 3.845 Character of Wound/Ulcer Post Debridement: Stable Severity of Tissue Post Debridement: Fat layer exposed Post Procedure Diagnosis Same as Pre-procedure Electronic Signature(s) Signed: 03/26/2021 12:14:23 PM By: Duanne Guess MD FACS Signed: 03/26/2021 5:18:59 PM By: Antonieta Iba Entered By: Antonieta Iba on 03/26/2021 11:47:33 -------------------------------------------------------------------------------- HPI Details Patient Name: Date of Service: [name], [name] 03/26/2021 11:00 A M Medical Record Number: [id number] Patient Account Number: [id number] Date of Birth/Sex: Treating RN: July 02, 1980 (41 y.o. F) Primary Care Provider: Gwinda Passe Other Clinician: Referring Provider: Treating Provider/Extender: Sharyl Nimrod in Treatment: 9 History of Present Illness HPI Description: Admission 01/22/2021 [name] is a 41 year old female with a past medical history of insulin-dependent uncontrolled type 2 diabetes with last hemoglobin A1c of 13.5, osteomyelitis of the right foot status post transmetatarsal amputation on 12/18/2020 that presents to the clinic for right foot wound. She has had dehiscence of the surgical site. She is currently using wet-to-dry dressings. She has a PICC line and receiving IV ceftriaxone daily for her osteomyelitis. There is an end date of 01/27/2021. She is also taking oral metronidazole. She currently denies systemic signs of infection. 1/19; patient presents for follow-up. She was diagnosed with a DVT to the right lower extremity 2 days ago. She is on Eliquis now. She is scheduled to see her infectious disease doctor tomorrow. She has been using Dakin's wet-to-dry dressings. She denies systemic signs of infection. 1/26; patient  presents for follow-up. She saw infectious disease on 1/21 started on Augmentin. Her PICC line and IV ceftriaxone was discontinued. Patient reports stability to her wound. She has been using Dakin's wet-to-dry dressings. She currently denies systemic signs of infection. 2/3; patient presents for follow-up. She continues to use Dakin's wet-to-dry dressings to the wound bed. She saw Dr. Manson Passey with infectious disease yesterday and is continuing Augmentin. T entative end date  is 2/16. Patient reports following up with orthopedics. She states there is no further plan from them. She currently denies systemic signs of infection. 2/10; patient presents for follow-up. She continues to use Dakin's wet to dry dressings. She is scheduled to have her MRI done on 2/14. She states that she had pain to the debridement site from last clinic visit and declines debridement today. She denies systemic signs of infection. She continues to have yellow thick drainage. 2/20; patient presents for follow-up. She continues to use Dakin's wet-to-dry dressings. She obtained her MRI. The results showed an abscess and she is scheduled to see her orthopedic surgeon on 2/23. She saw infectious disease 2/17 and her antibiotics were extended. She currently denies systemic signs of infection. 3/6; patient presents for follow-up. She had debridement and irrigation of her foot on 03/10/2021 due to abscess noted on MRI. She was started on IV ceftriaxone and oral Flagyl. She has no issues or complaints today. She has been using iodoform packing to the tunnel and Dakin's wet-to-dry to the opening. 03/26/2021: She continues on IV ceftriaxone and oral metronidazole. She has follow-up with infectious disease tomorrow. No significant issues or complaints today. Her mother continues to help her with her wound dressing, using iodoform packing strips into the tunnel and Dakin's to the open portion of the wound. Electronic Signature(s) Signed:  03/26/2021 11:58:05 AM By: Duanne Guess MD FACS Entered By: Duanne Guess on 03/26/2021 11:58:05 -------------------------------------------------------------------------------- Physical Exam Details Patient Name: Date of Service: [name], [name] 03/26/2021 11:00 A M Medical Record Number: [id number] Patient Account Number: [id number] Date of Birth/Sex: Treating RN: [birth year] (41 y.o. F) Primary Care Provider: Gwinda Passe Other Clinician: Referring Provider: Treating Provider/Extender: Sharyl Nimrod in Treatment: 9 Constitutional Within normal range for this patient.. . . . No acute distress. Respiratory Normal work of breathing on room air. Notes 03/26/2021: wound examright foot with transmetatarsal amputation site that dehisced. There is nonviable fat on the medial aspect, but there is granulation tissue and epithelialization occurring. The lateral aspect tunnel has serosanguineous drainage. No surrounding erythema, induration, or purulent drainage appreciated. Electronic Signature(s) Signed: 03/26/2021 12:00:02 PM By: Duanne Guess MD FACS Entered By: Duanne Guess on 03/26/2021 12:00:02 -------------------------------------------------------------------------------- Physician Orders Details Patient Name: Date of Service: [name], [name] 03/26/2021 11:00 A M Medical Record Number: [id number] Patient Account Number: [id number] Date of Birth/Sex: Treating RN: Dec 05, 1980 (41 y.o. [name] Primary Care Provider: Gwinda Passe Other Clinician: Referring Provider: Treating Provider/Extender: Sharyl Nimrod in Treatment: 9 Verbal / Phone Orders: No Diagnosis Coding ICD-10 Coding Code Description L97.514 Non-pressure chronic ulcer of other part of right foot with necrosis of bone E11.621 Type 2 diabetes mellitus with foot ulcer M86.9 Osteomyelitis, unspecified Follow-up Appointments ppointment  in 1 week. - with Dr. Mikey Bussing (Room 7, [place]) Return A Other: - Follow up with Infectious Disease 03/27/2021 Bathing/ Shower/ Hygiene Other Bathing/Shower/Hygiene Orders/Instructions: - Clean with Saline or Dakins Edema Control - Lymphedema / SCD / Other Elevate legs to the level of the heart or above for 30 minutes daily and/or when sitting, a frequency of: - throughout the day Avoid standing for long periods of time. Moisturize legs daily. Additional Orders / Instructions Follow Nutritious Diet - -Monitor/Control Blood Sugar -High Protein Diet Wound Treatment Wound #1 - Amputation Site - Transmetatarsal Wound Laterality: Right Cleanser: Normal Saline 2 x Per Day/15 Days Discharge Instructions: Cleanse the wound with Normal Saline prior to applying a  clean dressing using gauze sponges, not tissue or cotton balls. Topical: Dakin's solution 2 x Per Day/15 Days Discharge Instructions: Moisten Gauze with Dakins Prim Dressing: Iodoform packing strip 1/4 (in) 2 x Per Day/15 Days ary Discharge Instructions: Lightly pack into tunnel Secondary Dressing: Woven Gauze Sponge, Non-Sterile 4x4 in 2 x Per Day/15 Days Discharge Instructions: Apply over primary dressing as directed. Secondary Dressing: ABD Pad, 5x9 2 x Per Day/15 Days Discharge Instructions: Apply over primary dressing as directed. Secured With: Elastic Bandage 4 inch (ACE bandage) 2 x Per Day/15 Days Discharge Instructions: Secure with ACE bandage as directed. Secured With: [employer], 4.5x3.1 (in/yd) 2 x Per Day/15 Days Discharge Instructions: Secure with Kerlix as directed. Secured With: Transpore Surgical Tape, 2x10 (in/yd) 2 x Per Day/15 Days Discharge Instructions: Secure dressing with tape as directed. Electronic Signature(s) Signed: 03/26/2021 12:14:23 PM By: Duanne Guess MD FACS Entered By: Duanne Guess on 03/26/2021  12:04:00 -------------------------------------------------------------------------------- Problem List Details Patient Name: Date of Service: [name], [name] 03/26/2021 11:00 A M Medical Record Number: [id number] Patient Account Number: [id number] Date of Birth/Sex: Treating RN: Aug 12, 1980 (41 y.o. [name] Primary Care Provider: Gwinda Passe Other Clinician: Referring Provider: Treating Provider/Extender: Sharyl Nimrod in Treatment: 9 Active Problems ICD-10 Encounter Code Description Active Date MDM Diagnosis L97.514 Non-pressure chronic ulcer of other part of right foot with necrosis of bone 01/22/2021 No Yes E11.621 Type 2 diabetes mellitus with foot ulcer 01/22/2021 No Yes M86.9 Osteomyelitis, unspecified 01/22/2021 No Yes Inactive Problems Resolved Problems Electronic Signature(s) Signed: 03/26/2021 11:55:44 AM By: Duanne Guess MD FACS Entered By: Duanne Guess on 03/26/2021 11:55:44 -------------------------------------------------------------------------------- Progress Note Details Patient Name: Date of Service: [name], [name] 03/26/2021 11:00 A M Medical Record Number: [id number] Patient Account Number: [id number] Date of Birth/Sex: Treating RN: 1980-07-17 (41 y.o. F) Primary Care Provider: Gwinda Passe Other Clinician: Referring Provider: Treating Provider/Extender: Sharyl Nimrod in Treatment: 9 Subjective Chief Complaint Information obtained from Patient Osteomyelitis of the right foot status post transmetatarsal amputation with surgical site dehiscence History of Present Illness (HPI) Admission 01/22/2021 [name] is a 41 year old female with a past medical history of insulin-dependent uncontrolled type 2 diabetes with last hemoglobin A1c of 13.5, osteomyelitis of the right foot status post transmetatarsal amputation on 12/18/2020 that presents to the clinic for right foot  wound. She has had dehiscence of the surgical site. She is currently using wet-to-dry dressings. She has a PICC line and receiving IV ceftriaxone daily for her osteomyelitis. There is an end date of 01/27/2021. She is also taking oral metronidazole. She currently denies systemic signs of infection. 1/19; patient presents for follow-up. She was diagnosed with a DVT to the right lower extremity 2 days ago. She is on Eliquis now. She is scheduled to see her infectious disease doctor tomorrow. She has been using Dakin's wet-to-dry dressings. She denies systemic signs of infection. 1/26; patient presents for follow-up. She saw infectious disease on 1/21 started on Augmentin. Her PICC line and IV ceftriaxone was discontinued. Patient reports stability to her wound. She has been using Dakin's wet-to-dry dressings. She currently denies systemic signs of infection. 2/3; patient presents for follow-up. She continues to use Dakin's wet-to-dry dressings to the wound bed. She saw Dr. Manson Passey with infectious disease yesterday and is continuing Augmentin. T entative end date is 2/16. Patient reports following up with orthopedics. She states there is no further plan from them. She currently denies systemic  signs of infection. 2/10; patient presents for follow-up. She continues to use Dakin's wet to dry dressings. She is scheduled to have her MRI done on 2/14. She states that she had pain to the debridement site from last clinic visit and declines debridement today. She denies systemic signs of infection. She continues to have yellow thick drainage. 2/20; patient presents for follow-up. She continues to use Dakin's wet-to-dry dressings. She obtained her MRI. The results showed an abscess and she is scheduled to see her orthopedic surgeon on 2/23. She saw infectious disease 2/17 and her antibiotics were extended. She currently denies systemic signs of infection. 3/6; patient presents for follow-up. She had debridement  and irrigation of her foot on 03/10/2021 due to abscess noted on MRI. She was started on IV ceftriaxone and oral Flagyl. She has no issues or complaints today. She has been using iodoform packing to the tunnel and Dakin's wet-to-dry to the opening. 03/26/2021: She continues on IV ceftriaxone and oral metronidazole. She has follow-up with infectious disease tomorrow. No significant issues or complaints today. Her mother continues to help her with her wound dressing, using iodoform packing strips into the tunnel and Dakin's to the open portion of the wound. Patient History Information obtained from Patient. Family History Cancer - Paternal Grandparents, Diabetes - Mother, Hypertension - Mother, Stroke - Maternal Grandparents, No family history of Heart Disease, Hereditary Spherocytosis, Kidney Disease, Lung Disease, Seizures, Thyroid Problems, Tuberculosis. Social History Never smoker, Marital Status - Single, Alcohol Use - Rarely, Drug Use - Prior History - Marijuana, Caffeine Use - Daily. Medical History Cardiovascular Patient has history of Hypertension Endocrine Patient has history of Type II Diabetes Musculoskeletal Patient has history of Osteomyelitis - Right Transmet 12/18/20 Neurologic Patient has history of Neuropathy Objective Constitutional Within normal range for this patient.. No acute distress. Vitals Time Taken: 10:54 AM, Height: 69 in, Temperature: 98.3 F, Pulse: 92 bpm, Respiratory Rate: 17 breaths/min, Blood Pressure: 92/58 mmHg, Capillary Blood Glucose: 182 mg/dl. Respiratory Normal work of breathing on room air. General Notes: 03/26/2021: wound examooright foot with transmetatarsal amputation site that dehisced. There is nonviable fat on the medial aspect, but there is granulation tissue and epithelialization occurring. The lateral aspect tunnel has serosanguineous drainage. No surrounding erythema, induration, or purulent drainage appreciated. Integumentary (Hair,  Skin) Wound #1 status is Open. Original cause of wound was Surgical Injury. The date acquired was: 12/15/2020. The wound has been in treatment 9 weeks. The wound is located on the Right Amputation Site - Transmetatarsal. The wound measures 1.8cm length x 6.8cm width x 0.4cm depth; 9.613cm^2 area and 3.845cm^3 volume. There is Fat Layer (Subcutaneous Tissue) exposed. There is tunneling at 6:00 with a maximum distance of 3.1cm. There is a medium amount of serosanguineous drainage noted. The wound margin is thickened. There is medium (34-66%) red, pink granulation within the wound bed. There is a medium (34-66%) amount of necrotic tissue within the wound bed including Adherent Slough. Assessment Active Problems ICD-10 Non-pressure chronic ulcer of other part of right foot with necrosis of bone Type 2 diabetes mellitus with foot ulcer Osteomyelitis, unspecified Procedures Wound #1 Pre-procedure diagnosis of Wound #1 is a Diabetic Wound/Ulcer of the Lower Extremity located on the Right Amputation Site - Transmetatarsal .Severity of Tissue Pre Debridement is: Fat layer exposed. There was a Excisional Skin/Subcutaneous Tissue Debridement with a total area of 7.2 sq cm performed by Duanne Guess, MD. With the following instrument(s): Curette to remove Non-Viable tissue/material. Material removed includes Fat and Slough and  after achieving pain control using Other (Benzocaine). No specimens were taken. A time out was conducted at 11:39, prior to the start of the procedure. A Minimum amount of bleeding was controlled with Pressure. The procedure was tolerated well. Post Debridement Measurements: 1.8cm length x 6.8cm width x 0.4cm depth; 3.845cm^3 volume. Character of Wound/Ulcer Post Debridement is stable. Severity of Tissue Post Debridement is: Fat layer exposed. Post procedure Diagnosis Wound #1: Same as Pre-Procedure Plan Follow-up Appointments: Return Appointment in 1 week. - with Dr. Mikey BussingHoffman  (Room 7, [place]) Other: - Follow up with Infectious Disease 03/27/2021 Bathing/ Shower/ Hygiene: Other Bathing/Shower/Hygiene Orders/Instructions: - Clean with Saline or Dakins Edema Control - Lymphedema / SCD / Other: Elevate legs to the level of the heart or above for 30 minutes daily and/or when sitting, a frequency of: - throughout the day Avoid standing for long periods of time. Moisturize legs daily. Additional Orders / Instructions: Follow Nutritious Diet - -Monitor/Control Blood Sugar -High Protein Diet WOUND #1: - Amputation Site - Transmetatarsal Wound Laterality: Right Cleanser: Normal Saline 2 x Per Day/15 Days Discharge Instructions: Cleanse the wound with Normal Saline prior to applying a clean dressing using gauze sponges, not tissue or cotton balls. Topical: Dakin's solution 2 x Per Day/15 Days Discharge Instructions: Moisten Gauze with Dakins Prim Dressing: Iodoform packing strip 1/4 (in) 2 x Per Day/15 Days ary Discharge Instructions: Lightly pack into tunnel Secondary Dressing: Woven Gauze Sponge, Non-Sterile 4x4 in 2 x Per Day/15 Days Discharge Instructions: Apply over primary dressing as directed. Secondary Dressing: ABD Pad, 5x9 2 x Per Day/15 Days Discharge Instructions: Apply over primary dressing as directed. Secured With: Elastic Bandage 4 inch (ACE bandage) 2 x Per Day/15 Days Discharge Instructions: Secure with ACE bandage as directed. Secured With: [employer] Roll Sterile, 4.5x3.1 (in/yd) 2 x Per Day/15 Days Discharge Instructions: Secure with Kerlix as directed. Secured With: Transpore Surgical T ape, 2x10 (in/yd) 2 x Per Day/15 Days Discharge Instructions: Secure dressing with tape as directed. 03/26/2021: wound examooright foot with transmetatarsal amputation site that dehisced. There is nonviable fat on the medial aspect, but there is granulation tissue and epithelialization occurring. The lateral aspect tunnel has serosanguineous drainage. No surrounding  erythema, induration, or purulent drainage appreciated. I debrided nonviable fat from the medial aspect along with some surrounding callus from around the wound. Continue Dakin's wet-to-dry dressings on the open aspect and pack the tunnel with iodoform strips. Follow-up with Dr. Mikey BussingHoffman in a week. Electronic Signature(s) Signed: 03/26/2021 12:05:01 PM By: Duanne Guessannon, Junia Nygren MD FACS Entered By: Duanne Guessannon, Nafeesa Dils on 03/26/2021 12:05:01 -------------------------------------------------------------------------------- HxROS Details Patient Name: Date of Service: [name], [name] 03/26/2021 11:00 A M Medical Record Number: [id number] Patient Account Number: [id number] Date of Birth/Sex: Treating RN: 1980/09/11 (41 y.o. F) Primary Care Provider: Gwinda PasseEdwards, Michelle Other Clinician: Referring Provider: Treating Provider/Extender: Sharyl Nimrodannon, Jamyrah Saur Edwards, Michelle Weeks in Treatment: 9 Information Obtained From Patient Cardiovascular Medical History: Positive for: Hypertension Endocrine Medical History: Positive for: Type II Diabetes Time with diabetes: Dx 2009 Treated with: Insulin, Oral agents Blood sugar tested every day: Yes Tested : daily Musculoskeletal Medical History: Positive for: Osteomyelitis - Right Transmet 12/18/20 Neurologic Medical History: Positive for: Neuropathy Immunizations Pneumococcal Vaccine: Received Pneumococcal Vaccination: No Implantable Devices Yes Family and Social History Cancer: Yes - Paternal Grandparents; Diabetes: Yes - Mother; Heart Disease: No; Hereditary Spherocytosis: No; Hypertension: Yes - Mother; Kidney Disease: No; Lung Disease: No; Seizures: No; Stroke: Yes - Maternal Grandparents; Thyroid Problems: No; Tuberculosis: No; Never smoker;  Marital Status - Single; Alcohol Use: Rarely; Drug Use: Prior History - Marijuana; Caffeine Use: Daily; Financial Concerns: No; Food, Clothing or Shelter Needs: No; Support System Lacking: No; Transportation  Concerns: No Physician Affirmation I have reviewed and agree with the above information. Electronic Signature(s) Signed: 03/26/2021 12:14:23 PM By: Duanne Guess MD FACS Entered By: Duanne Guess on 03/26/2021 11:58:18 -------------------------------------------------------------------------------- SuperBill Details Patient Name: Date of Service: [name], [name] 03/26/2021 Medical Record Number: [id number] Patient Account Number: [id number] Date of Birth/Sex: Treating RN: [birth year] (41 y.o. [name] Primary Care Provider: Gwinda Passe Other Clinician: Referring Provider: Treating Provider/Extender: Sharyl Nimrod in Treatment: 9 Diagnosis Coding ICD-10 Codes Code Description [phone] Non-pressure chronic ulcer of other part of right foot with necrosis of bone E11.621 Type 2 diabetes mellitus with foot ulcer M86.9 Osteomyelitis, unspecified Facility Procedures CPT4 Code: [id number] Description: 11042 - DEB SUBQ TISSUE 20 SQ CM/< ICD-10 Diagnosis Description L97.514 Non-pressure chronic ulcer of other part of right foot with necrosis of bone Modifier: Quantity: 1 Physician Procedures : CPT4 Code Description Modifier [id number] 11042 - WC PHYS SUBQ TISS 20 SQ CM ICD-10 Diagnosis Description L97.514 Non-pressure chronic ulcer of other part of right foot with necrosis of bone Quantity: 1 Electronic Signature(s) Signed: 03/26/2021 12:05:10 PM By: Duanne Guess MD FACS Entered By: Duanne Guess on 03/26/2021 12:05:10

## 2021-03-26 NOTE — Progress Notes (Signed)
Subjective:  Patient ID: [name], female    DOB: [birth year]  Age: 40 y.o. MRN: [id number]  CC: Diabetes   HPI [name] presents forFollow-up of diabetes. Patient does not check blood sugar at home. Mother is present at all her appts discussing how she did not take her medication on a schedule- and just doesn't want to do anything. We discussed she is depressed - patient has to take responsibility too not taking care of her self knowing she was a diabetic. She begin to cry then discussed what and why she has to look forward too!! Discussed  co- morbidities with uncontrol diabetes  Complications -diabetic retinopathy, (close your eyes ? What do you see nothing)-she is able to see through her pretty blue eyeglasses, nephropathy decrease in kidney function- she is not on dialysis-on a machine 3 days a week to filter your kidney, neuropathy-she has not had a  heart attack and stroke. She was grateful and knew how much she had to be thankful for did not realize until then.   Compliant with meds - Yes Checking CBGs? No  Fasting avg -   Postprandial average -  Exercising regularly? - No Watching carbohydrate intake? - Yes Neuropathy ? - Yes Hypoglycemic events - No  - Recovers with :   Pertinent ROS:  Polyuria - No Polydipsia - No Vision problems - No  Medications as noted below. Taking them regularly without complication/adverse reaction being reported today.   History [name] has a past medical history of Class 3 obesity (HCC) (12/16/2020) and Diabetes mellitus.   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   Her family history includes Diabetes in her mother and another family member; Hypertension in her mother.She reports that she has quit smoking. Her smoking use included cigarettes. She does not have any smokeless tobacco history on  file. She reports that she does not currently use alcohol. She reports current drug use. Drug: Marijuana.  Current Outpatient Medications on File Prior to Visit  Medication Sig Dispense Refill   acetaminophen (TYLENOL) 325 MG tablet Take 2 tablets (650 mg total) by mouth every 6 (six) hours as needed for mild pain (or Fever >/= 101).     apixaban (ELIQUIS) 5 MG TABS tablet Take  one-5mg  tablet twice daily. 180 tablet 1   Blood Glucose Monitoring Suppl (TRUE METRIX METER) w/Device KIT Use to check blood sugar twice a day. 1 kit 0   cefTRIAXone (ROCEPHIN) IVPB Inject 2 g into the vein daily for 18 days. Indication:  Wound infection of previous TMA site  First Dose: Yes Last Day of Therapy:  03/31/21  Labs - Once weekly:  CBC/D and BMP, Labs - Every other week:  ESR and CRP Method of administration: IV Push Method of administration may be changed at the discretion of home infusion pharmacist based upon assessment of the patient and/or caregiver's ability to self-administer the medication ordered. 18 Units 0   gabapentin (NEURONTIN) 300 MG capsule Take 2 capsules by mouth 3 times daily. 180 capsule 0   glucose blood (TRUE METRIX BLOOD GLUCOSE TEST) test strip Use to check blood sugar twice a day. 100 each 2   Insulin Glargine (BASAGLAR KWIKPEN) 100 UNIT/ML Inject 60 Units into the skin daily. 15 mL 2   Insulin Pen Needle 32G X 4 MM MISC use as directed 100 each 2   lisinopril (ZESTRIL) 2.5  MG tablet Take 1 tablet by mouth daily 90 tablet 0   metFORMIN (GLUCOPHAGE-XR) 500 MG 24 hr tablet Take 2 tablets (1,000 mg total) by mouth 2 (two) times daily. 120 tablet 2   ondansetron (ZOFRAN) 4 MG tablet Take 1 tablet (4 mg total) by mouth every 6 (six) hours as needed for nausea. 20 tablet 0   TRUEplus Lancets 28G MISC Use to check blood sugar twice a day. 100 each 2   No current facility-administered medications on file prior to visit.    ROS Comprehensive ROS Pertinent positive and negative noted in  HPI    Objective:  BP 109/71 (BP Location: Left Arm, Patient Position: Sitting, Cuff Size: Large)   Pulse 95   Temp 98.2 F (36.8 C) (Oral)   Ht 5\' 9"  (1.753 m)   LMP 03/07/2021 (Approximate)   SpO2 96%   BMI 44.08 kg/m   BP Readings from Last 3 Encounters:  03/27/21 107/72  03/26/21 109/71  03/14/21 99/63    Wt Readings from Last 3 Encounters:  03/27/21 (!) 304 lb (137.9 kg)  03/14/21 298 lb 8.1 oz (135.4 kg)  02/15/21 (!) 300 lb 3.2 oz (136.2 kg)    Physical Exam Vitals reviewed.  Constitutional:      Appearance: She is obese.  HENT:     Head: Normocephalic.     Right Ear: Tympanic membrane and external ear normal.     Left Ear: Tympanic membrane and external ear normal.     Nose: Nose normal.  Cardiovascular:     Rate and Rhythm: Normal rate and regular rhythm.  Pulmonary:     Effort: Pulmonary effort is normal.     Breath sounds: Normal breath sounds.  Musculoskeletal:     Cervical back: Normal range of motion and neck supple.     Comments: Transmetatarsal amputation wrapped seen by the wound center earlier today  Skin:    General: Skin is warm and dry.  Neurological:     Mental Status: She is alert.    Lab Results  Component Value Date   HGBA1C 8.3 (A) 03/26/2021   HGBA1C 13.5 (H) 12/16/2020    Lab Results  Component Value Date   WBC 9.5 03/13/2021   HGB 11.6 (L) 03/13/2021   HCT 36.8 03/13/2021   PLT 265 03/13/2021   GLUCOSE 144 (H) 03/13/2021   CHOL 92 12/20/2020   TRIG 103 12/20/2020   HDL 13 (L) 12/20/2020   LDLCALC 58 12/20/2020   ALT 11 03/09/2021   AST 12 (L) 03/09/2021   NA 134 (L) 03/13/2021   K 4.2 03/13/2021   CL 105 03/13/2021   CREATININE 0.68 03/13/2021   BUN 24 (H) 03/13/2021   CO2 22 03/13/2021   INR 1.0 03/08/2021   HGBA1C 8.3 (A) 03/26/2021     Assessment & Plan:   [name] was seen today for diabetes.  Diagnoses and all orders for this visit:  Type 2 diabetes mellitus with hyperglycemia, unspecified whether long  term insulin use (HCC) HgB A1c 8.3 vast improvement . Continue to monitor foods that are high in carbohydrates are the following rice, potatoes, breads, sugars, and pastas.  Reduction in the intake (eating) will assist in lowering your blood sugars.    I am having Roxy L. Lowenstein maintain her acetaminophen, ondansetron, lisinopril, Basaglar KwikPen, metFORMIN, Insulin Pen Needle, True Metrix Meter, True Metrix Blood Glucose Test, TRUEplus Lancets 28G, apixaban, cefTRIAXone, and gabapentin.  No orders of the defined types were placed in this encounter.  Follow-up:   Return in about 3 months (around 06/26/2021) for DM/fasting last.  The above assessment and management plan was discussed with the patient. The patient verbalized understanding of and has agreed to the management plan. Patient is aware to call the clinic if symptoms fail to improve or worsen. Patient is aware when to return to the clinic for a follow-up visit. Patient educated on when it is appropriate to go to the emergency department.   Gwinda Passe, NP-C

## 2021-03-27 ENCOUNTER — Encounter: Payer: Self-pay | Admitting: Infectious Diseases

## 2021-03-27 ENCOUNTER — Other Ambulatory Visit: Payer: Self-pay

## 2021-03-27 ENCOUNTER — Other Ambulatory Visit (INDEPENDENT_AMBULATORY_CARE_PROVIDER_SITE_OTHER): Payer: Self-pay | Admitting: Primary Care

## 2021-03-27 ENCOUNTER — Telehealth: Payer: Self-pay

## 2021-03-27 ENCOUNTER — Ambulatory Visit (INDEPENDENT_AMBULATORY_CARE_PROVIDER_SITE_OTHER): Payer: Medicaid Other | Admitting: Infectious Diseases

## 2021-03-27 VITALS — BP 107/72 | HR 79 | Temp 97.7°F | Ht 69.0 in | Wt 304.0 lb

## 2021-03-27 DIAGNOSIS — Z452 Encounter for adjustment and management of vascular access device: Secondary | ICD-10-CM

## 2021-03-27 DIAGNOSIS — M869 Osteomyelitis, unspecified: Secondary | ICD-10-CM | POA: Diagnosis not present

## 2021-03-27 DIAGNOSIS — E08621 Diabetes mellitus due to underlying condition with foot ulcer: Secondary | ICD-10-CM

## 2021-03-27 DIAGNOSIS — Z5181 Encounter for therapeutic drug level monitoring: Secondary | ICD-10-CM

## 2021-03-27 DIAGNOSIS — L97519 Non-pressure chronic ulcer of other part of right foot with unspecified severity: Secondary | ICD-10-CM

## 2021-03-27 MED ORDER — OZEMPIC (0.25 OR 0.5 MG/DOSE) 2 MG/1.5ML ~~LOC~~ SOPN
0.2500 mg | PEN_INJECTOR | SUBCUTANEOUS | 4 refills | Status: DC
Start: 2021-03-27 — End: 2021-03-28
  Filled 2021-03-27: qty 1.5, 56d supply, fill #0

## 2021-03-27 MED ORDER — METRONIDAZOLE 500 MG PO TABS
500.0000 mg | ORAL_TABLET | Freq: Two times a day (BID) | ORAL | 0 refills | Status: DC
  Filled 2021-03-27: qty 42, 21d supply, fill #0

## 2021-03-27 MED ORDER — CEFADROXIL 500 MG PO CAPS
1000.0000 mg | ORAL_CAPSULE | Freq: Two times a day (BID) | ORAL | 0 refills | Status: DC
Start: 2021-03-27 — End: 2021-10-26
  Filled 2021-03-27: qty 84, 21d supply, fill #0

## 2021-03-27 NOTE — Telephone Encounter (Signed)
Verbal orders given to [facility] at [facility], per Dr.Manandhar - pull picc after last dose of ceftriaxone on 3/18. [name] repeated verbal orders back to me and verbalized his understanding.  ? ? ? ?[name] [name], CMA ? ?

## 2021-03-27 NOTE — Telephone Encounter (Signed)
Thank you :)

## 2021-03-27 NOTE — Progress Notes (Addendum)
? ?   ? ?Patient Active Problem List  ? Diagnosis Date Noted  ? Hyponatremia 03/09/2021  ? HTN (hypertension) 03/08/2021  ? Deep vein thrombosis (DVT) of right lower extremity ([place]) 03/08/2021  ? Wound dehiscence, surgical 02/02/2021  ? Obesity 02/02/2021  ? Wound dehiscence 01/24/2021  ? Medication monitoring encounter 01/02/2021  ? Diabetic ulcer of right foot associated with diabetes mellitus due to underlying condition ([place]) 01/02/2021  ? Right foot infection   ? Osteomyelitis of second toe of right foot ([place]) 12/16/2020  ? Type 2 diabetes mellitus with hyperglycemia ([place]) 12/16/2020  ? Class 3 obesity ([place]) 12/16/2020  ? Hyperlipidemia 12/16/2020  ? Soft tissue mass 09/15/2015  ? ? ?Current Outpatient Medications on File Prior to Visit  ?Medication Sig Dispense Refill  ? acetaminophen (TYLENOL) 325 MG tablet Take 2 tablets (650 mg total) by mouth every 6 (six) hours as needed for mild pain (or Fever >/= 101).    ? apixaban (ELIQUIS) 5 MG TABS tablet Take  one-61m tablet twice daily. 180 tablet 1  ? Blood Glucose Monitoring Suppl (TRUE METRIX METER) w/Device KIT Use to check blood sugar twice a day. 1 kit 0  ? cefTRIAXone (ROCEPHIN) IVPB Inject 2 g into the vein daily for 18 days. Indication:  Wound infection of previous TMA site  ?First Dose: Yes ?Last Day of Therapy:  03/31/21  ?Labs - Once weekly:  CBC/D and BMP, ?Labs - Every other week:  ESR and CRP ?Method of administration: IV Push ?Method of administration may be changed at the discretion of home infusion pharmacist based upon assessment of the patient and/or caregiver's ability to self-administer the medication ordered. 18 Units 0  ? gabapentin (NEURONTIN) 300 MG capsule Take 2 capsules by mouth 3 times daily. 180 capsule 0  ? glucose blood (TRUE METRIX BLOOD GLUCOSE TEST) test strip Use to check blood sugar twice a day. 100 each 2  ? Insulin Glargine (BASAGLAR KWIKPEN) 100 UNIT/ML Inject 60 Units into the skin daily. 15 mL 2  ? Insulin Pen Needle 32G X  4 MM MISC use as directed 100 each 2  ? lisinopril (ZESTRIL) 2.5 MG tablet Take 1 tablet by mouth daily 90 tablet 0  ? metFORMIN (GLUCOPHAGE-XR) 500 MG 24 hr tablet Take 2 tablets (1,000 mg total) by mouth 2 (two) times daily. 120 tablet 2  ? metroNIDAZOLE (FLAGYL PO) Take by mouth.    ? ondansetron (ZOFRAN) 4 MG tablet Take 1 tablet (4 mg total) by mouth every 6 (six) hours as needed for nausea. 20 tablet 0  ? TRUEplus Lancets 28G MISC Use to check blood sugar twice a day. 100 each 2  ? ?No current facility-administered medications on file prior to visit.  ? ? ? ?Subjective: ?After last seen in the office on 2/17, patient was admitted in the hospital and had I and D of RT TMA with deep debridement done on 2/25. CX grew Prevotella bivia. She was discharged on ceftriaxone and metronidazole through PICC. She is getting IV ceftriaxone through PICC without any issues. Denies fevers, chills, sweats. Denies nausea, vomiting and diarrhea. She has a fu with Ortho next week.  ? ?Review of Systems: ?ROS all systems reviewed and negative except as above  ? ?Past Medical History:  ?Diagnosis Date  ? Class 3 obesity ([place] 12/16/2020  ? Diabetes mellitus   ? ?Past Surgical History:  ?Procedure Laterality Date  ? EXTREMITY CYST EXCISION    ? MINOR IRRIGATION AND DEBRIDEMENT OF WOUND Right 03/10/2021  ?  Procedure: MINOR IRRIGATION AND DEBRIDEMENT OF WOUND;  Surgeon: Armond Hang, MD;  Location: [facility];  Service: Orthopedics;  Laterality: Right;  ? 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 Procedure: TRANSMETATARSAL AMPUTATION;  Surgeon: Armond Hang, MD;  Location: [facility];  Service: Orthopedics;  Laterality: Right;  ? ? ?Social History  ? ?Tobacco Use  ? Smoking status: Former  ?  Types: Cigarettes  ?Vaping Use  ? Vaping Use: Never used  ?Substance Use Topics  ? Alcohol use: Yes  ?  Comment: occ  ? Drug use: Yes  ?  Types: Marijuana  ? ? ?Family History  ?Problem Relation Age of Onset  ? Hypertension Mother   ?  Diabetes Mother   ? Diabetes Other   ? ? ?No Known Allergies ? ?Health Maintenance  ?Topic Date Due  ? COVID-19 Vaccine (1) Never done  ? FOOT EXAM  Never done  ? OPHTHALMOLOGY EXAM  Never done  ? Hepatitis C Screening  Never done  ? PAP SMEAR-Modifier  Never done  ? INFLUENZA VACCINE  04/13/2021 (Originally 08/14/2020)  ? TETANUS/TDAP  01/17/2022 (Originally 06/06/2000)  ? HEMOGLOBIN A1C  09/26/2021  ? HIV Screening  Completed  ? HPV VACCINES  Aged Out  ? ? ?Objective: ?BP 107/72   Pulse 79   Temp 97.7 ?F (36.5 ?C) (Oral)   Ht _0  (1.753 m)   Wt (!) 304 lb (137.9 kg) Comment: pt in w/c, stated  LMP 03/07/2021 (Approximate)   SpO2 100%   BMI 44.89 kg/m?  ? ? ?Physical Exam ?Constitutional:   ?   Appearance: Normal appearance.  ?HENT:  ?   Head: Normocephalic and atraumatic.   ?   Mouth: Mucous membranes are moist.  ?Eyes: ?   Conjunctiva/sclera: Conjunctivae normal.  ?   Pupils: Pupils are equal, round ? ?Cardiovascular:  ?   Rate and Rhythm: Normal rate and regular rhythm.  ?   Heart sounds:  ? ?Pulmonary:  ?   Effort: Pulmonary effort is normal.  ?   Breath sounds: Normal breath sounds.  ? ?Abdominal:  ?   General: Non distended  ?   Palpations: soft.  ? ?Musculoskeletal:     ?   General: Normal range of motion.  ? ?RT TMA site ? ? ? ?Skin: ?   General: Skin is warm and dry.  ?   Comments: ? ?Neurological:  ?   General: grossly non focal  ?   Mental Status: awake, alert and oriented to person, place, and time.  ? ?Psychiatric:     ?   Mood and Affect: Mood normal.  ? ?Pathology  ?FINAL MICROSCOPIC DIAGNOSIS:  ? ?A. RIGHT FOOT TISSUE, TRANSMETATARSAL AMPUTATION REVISION:  ?- Skin with ulceration and associated necrotizing inflammation  ? ?Microbiology ?Results for orders placed or performed during the hospital encounter of 03/08/21  ?Blood culture (routine x 2)     Status: None  ? Collection Time: 03/08/21 12:23 PM  ? Specimen: BLOOD  ?Result Value Ref Range Status  ? Specimen Description   Final  ?  BLOOD  BLOOD RIGHT FOREARM ?Performed at [facility], [place] [address], [place], [place] [address] ?  ? Special Requests   Final  ?  BOTTLES DRAWN AEROBIC AND ANAEROBIC Blood Culture results may not be optimal due to an inadequate volume of blood received in culture bottles ?Performed at [facility], [place] [address], [place], [place] [address] ?  ? Culture   Final  ?  NO GROWTH 5 DAYS ?Performed at [facility], [place] [address], [place], [place] [address] ?  ? Report Status 03/13/2021 FINAL  Final  ?Resp Panel by RT-PCR (Flu A&B, Covid) Peripheral     Status: None  ? Collection Time: 03/08/21  3:11 PM  ? Specimen: Peripheral; Nasopharyngeal(NP) swabs in vial transport medium  ?Result Value Ref Range Status  ? SARS Coronavirus 2 by RT PCR NEGATIVE NEGATIVE Final  ?  Comment: (NOTE) ?SARS-CoV-2 target nucleic acids are NOT DETECTED. ? ?The SARS-CoV-2 RNA is generally detectable in upper respiratory ?specimens during the acute phase of infection. The lowest ?concentration of SARS-CoV-2 viral copies this assay can detect is ?138 copies/mL. A negative result does not preclude SARS-Cov-2 ?infection and should not be used as the sole basis for treatment or ?other patient management decisions. A negative result may occur with  ?improper specimen collection/handling, submission of specimen other ?than nasopharyngeal swab, presence of viral mutation(s) within the ?areas targeted by this assay, and inadequate number of viral ?copies(<138 copies/mL). A negative result must be combined with ?clinical observations, patient history, and epidemiological ?information. The expected result is Negative. ? ?Fact Sheet for Patients:  ?EntrepreneurPulse.com.au ? ?Fact Sheet for Healthcare Providers:  ?IncredibleEmployment.be ? ?This test is no t yet approved or cleared by the [place] FDA and  ?has been authorized for detection and/or diagnosis of SARS-CoV-2  by ?FDA under an Emergency Use Authorization (EUA). This EUA will remain  ?in effect (meaning this test can be used) for the duration of the ?COVID-19 declaration under Section 564(b)(1) of the Act, 21 ?U.S.C.section 360

## 2021-03-28 ENCOUNTER — Ambulatory Visit: Payer: Self-pay | Attending: Family Medicine

## 2021-03-28 ENCOUNTER — Other Ambulatory Visit: Payer: Self-pay

## 2021-03-28 ENCOUNTER — Telehealth (INDEPENDENT_AMBULATORY_CARE_PROVIDER_SITE_OTHER): Payer: Self-pay | Admitting: Primary Care

## 2021-03-28 ENCOUNTER — Other Ambulatory Visit: Payer: Self-pay | Admitting: Pharmacist

## 2021-03-28 DIAGNOSIS — E1165 Type 2 diabetes mellitus with hyperglycemia: Secondary | ICD-10-CM

## 2021-03-28 MED ORDER — TRULICITY 0.75 MG/0.5ML ~~LOC~~ SOAJ
0.7500 mg | SUBCUTANEOUS | 1 refills | Status: DC
  Filled 2021-03-28: qty 2, 28d supply, fill #0
  Filled 2021-04-29: qty 2, 28d supply, fill #1

## 2021-03-28 NOTE — Telephone Encounter (Signed)
Pt called to report that the pharmacy does not have Ozempic, however they do have trulicity. Patient wants to know if this is an acceptable alternative.  ? ?Best contact: [phone] ? ?Call back request  ?

## 2021-03-28 NOTE — Telephone Encounter (Signed)
Routed to PCP 

## 2021-03-29 ENCOUNTER — Other Ambulatory Visit: Payer: Self-pay

## 2021-03-30 ENCOUNTER — Other Ambulatory Visit: Payer: Self-pay

## 2021-03-30 NOTE — Progress Notes (Signed)
Rehmann, Orlean L. ([id number]) ?Visit Report for 03/26/2021 ?Arrival Information Details ?Patient Name: Date of Service: ?[name] V IS, [name] 03/26/2021 11:00 A M ?Medical Record Number: [id number] ?Patient Account Number: [id number] ?Date of Birth/Sex: Treating RN: ?[birth year] (41 y.o. [name], [name] ?Primary Care Ramani Riva: Juluis Mire Other Clinician: ?Referring Sosie Gato: ?Treating Jenayah Antu/Extender: Fredirick Maudlin ?Juluis Mire ?Weeks in Treatment: 9 ?Visit Information History Since Last Visit ?Added or deleted any medications: No ?Patient Arrived: Ambulatory ?Any new allergies or adverse reactions: No ?Arrival Time: 10:52 ?Had a fall or experienced change in No ?Accompanied By: self ?activities of daily living that may affect ?Transfer Assistance: None ?risk of falls: ?Patient Identification Verified: Yes ?Signs or symptoms of abuse/neglect since last visito No ?Secondary Verification Process Completed: Yes ?Hospitalized since last visit: No ?Patient Requires Transmission-Based No ?Implantable device outside of the clinic excluding No ?Precautions: ?cellular tissue based products placed in the center ?Patient Has Alerts: Yes ?since last visit: ?Patient Alerts: Patient on Blood Thinner ?Has Dressing in Place as Prescribed: Yes ?PICC R Arm ?Pain Present Now: No ?ABI 12/17/20 R=1.08 ?L=1.13 ?Electronic Signature(s) ?Signed: 03/30/2021 12:57:05 PM By: Rhae Hammock RN ?Entered By: Rhae Hammock on 03/26/2021 10:52:48 ?-------------------------------------------------------------------------------- ?Encounter Discharge Information Details ?Patient Name: Date of Service: ?[name] V IS, [name] 03/26/2021 11:00 A M ?Medical Record Number: [id number] ?Patient Account Number: [id number] ?Date of Birth/Sex: Treating RN: ?[birth year] (41 y.o. [name] ?Primary Care Laurel Harnden: Juluis Mire Other Clinician: ?Referring Asencion Loveday: ?Treating Marci Polito/Extender: Fredirick Maudlin ?Juluis Mire ?Weeks in  Treatment: 9 ?Encounter Discharge Information Items Post Procedure Vitals ?Discharge Condition: Stable ?Temperature (F): 98.3 ?Ambulatory Status: Wheelchair ?Pulse (bpm): 92 ?Discharge Destination: Home ?Respiratory Rate (breaths/min): 17 ?Transportation: Private Auto ?Blood Pressure (mmHg): 92/58 ?Schedule Follow-up Appointment: Yes ?Clinical Summary of Care: Provided on 03/26/2021 ?Form Type Recipient ?Paper Patient Patient ?Electronic Signature(s) ?Signed: 03/26/2021 5:18:59 PM By: Lorrin Jackson ?Entered By: Lorrin Jackson on 03/26/2021 11:49:10 ?-------------------------------------------------------------------------------- ?Lower Extremity Assessment Details ?Patient Name: ?Date of Service: ?[name] V IS, [name] 03/26/2021 11:00 A M ?Medical Record Number: [id number] ?Patient Account Number: [id number] ?Date of Birth/Sex: ?Treating RN: ?1980-08-25 (41 y.o. [name], [name] ?Primary Care Martino Tompson: Juluis Mire ?Other Clinician: ?Referring Iolani Twilley: ?Treating Lucah Petta/Extender: Fredirick Maudlin ?Juluis Mire ?Weeks in Treatment: 9 ?Edema Assessment ?Assessed: [Left: No] [Right: Yes] ?Edema: [Left: N] [Right: o] ?Calf ?Left: Right: ?Point of Measurement: 34 cm From Medial Instep 45.5 cm ?Ankle ?Left: Right: ?Point of Measurement: 8 cm From Medial Instep 26.2 cm ?Vascular Assessment ?Pulses: ?Dorsalis Pedis ?Palpable: [Right:Yes] ?Posterior Tibial ?Palpable: [Right:Yes] ?Electronic Signature(s) ?Signed: 03/30/2021 12:57:05 PM By: Rhae Hammock RN ?Entered By: Rhae Hammock on 03/26/2021 10:53:57 ?-------------------------------------------------------------------------------- ?Multi Wound Chart Details ?Patient Name: ?Date of Service: ?[name] V IS, [name] 03/26/2021 11:00 A M ?Medical Record Number: [id number] ?Patient Account Number: [id number] ?Date of Birth/Sex: ?Treating RN: ?03/19/1980 (41 y.o. F) ?Primary Care Lamarius Dirr: Juluis Mire ?Other Clinician: ?Referring Chaniece Barbato: ?Treating  Shloma Roggenkamp/Extender: Fredirick Maudlin ?Juluis Mire ?Weeks in Treatment: 9 ?Vital Signs ?Height(in): 69 ?Capillary Blood Glucose(mg/dl): 182 ?Weight(lbs): ?Pulse(bpm): 92 ?Body Mass Index(BMI): ?Blood Pressure(mmHg): 92/58 ?Temperature(??F): 98.3 ?Respiratory Rate(breaths/min): 17 ?Photos: [1:Right Amputation Site -] [N/A:N/A N/A] ?Wound Location: [1:Transmetatarsal Surgical Injury] [N/A:N/A] ?Wounding Event: [1:Diabetic Wound/Ulcer of the Lower] [N/A:N/A] ?Primary Etiology: [1:Extremity Hypertension, Type II Diabetes,] [N/A:N/A] ?Comorbid History: [1:Osteomyelitis, Neuropathy 12/15/2020] [N/A:N/A] ?Date Acquired: [1:9] [N/A:N/A] ?Weeks of Treatment: [1:Open] [N/A:N/A] ?Wound Status: [1:No] [N/A:N/A] ?Wound Recurrence: [1:1.8x6.8x0.4] [N/A:N/A] ?Measurements L x W x D (cm) [1:9.613] [N/A:N/A] ?A (cm?) : ?rea [1:3.845] [N/A:N/A] ?  Volume (cm?) : [1:77.40%] [N/A:N/A] ?% Reduction in A [1:rea: 94.40%] [N/A:N/A] ?% Reduction in Volume: [1:6] ?Position 1 (o'clock): [1:3.1] ?Maximum Distance 1 (cm): [1:Yes] [N/A:N/A] ?Tunneling: [1:Grade 3] [N/A:N/A] ?Classification: [1:Medium] [N/A:N/A] ?Exudate A mount: [1:Serosanguineous] [N/A:N/A] ?Exudate Type: [1:red, brown] [N/A:N/A] ?Exudate Color: [1:Thickened] [N/A:N/A] ?Wound Margin: [1:Medium (34-66%)] [N/A:N/A] ?Granulation A mount: [1:Red, Pink] [N/A:N/A] ?Granulation Quality: [1:Medium (34-66%)] [N/A:N/A] ?Necrotic A mount: ?[1:Fat Layer (Subcutaneous Tissue): Yes N/A] ?Exposed Structures: ?[1:Fascia: No Tendon: No Muscle: No Joint: No Bone: No None] [N/A:N/A] ?Epithelialization: [1:Debridement - Excisional] [N/A:N/A] ?Debridement: ?Pre-procedure Verification/Time Out 11:39 [N/A:N/A] ?Taken: [1:Other] [N/A:N/A] ?Pain Control: [1:Fat, Slough] [N/A:N/A] ?Tissue Debrided: [1:Skin/Subcutaneous Tissue] [N/A:N/A] ?Level: [1:7.2] [N/A:N/A] ?Debridement A (sq cm): [1:rea Curette] [N/A:N/A] ?Instrument: [1:Minimum] [N/A:N/A] ?Bleeding: [1:Pressure] [N/A:N/A] ?Hemostasis A chieved:  [1:Procedure was tolerated well] [N/A:N/A] ?Debridement Treatment Response: [1:1.8x6.8x0.4] [N/A:N/A] ?Post Debridement Measurements L x ?W x D (cm) [1:3.845] [N/A:N/A] ?Post Debridement Volume: (cm?) [1:Debridement] [N/A:N/A] ?Treatment Notes ?Wound #1 (Amputation Site - Transmetatarsal) Wound Laterality: Right ?Cleanser ?Normal Saline ?Discharge Instruction: Cleanse the wound with Normal Saline prior to applying a clean dressing using gauze sponges, not tissue or cotton balls. ?Peri-Wound Care ?Topical ?Dakin's solution ?Discharge Instruction: Moisten Gauze with Dakins ?Primary Dressing ?Iodoform packing strip 1/4 (in) ?Discharge Instruction: Lightly pack into tunnel ?Secondary Dressing ?Woven Gauze Sponge, Non-Sterile 4x4 in ?Discharge Instruction: Apply over primary dressing as directed. ?ABD Pad, 5x9 ?Discharge Instruction: Apply over primary dressing as directed. ?Secured With ?Elastic Bandage 4 inch (ACE bandage) ?Discharge Instruction: Secure with ACE bandage as directed. ?Kerlix Roll Sterile, 4.5x3.1 (in/yd) ?Discharge Instruction: Secure with Kerlix as directed. ?Transpore Surgical Tape, 2x10 (in/yd) ?Discharge Instruction: Secure dressing with tape as directed. ?Compression Wrap ?Compression Stockings ?Add-Ons ?Electronic Signature(s) ?Signed: 03/26/2021 11:56:53 AM By: Fredirick Maudlin MD FACS ?Entered By: Fredirick Maudlin on 03/26/2021 11:56:53 ?-------------------------------------------------------------------------------- ?Multi-Disciplinary Care Plan Details ?Patient Name: ?Date of Service: ?[name] V IS, [name] 03/26/2021 11:00 A M ?Medical Record Number: [id number] ?Patient Account Number: [id number] ?Date of Birth/Sex: ?Treating RN: ?[birth year] (41 y.o. [name] ?Primary Care Taziyah Iannuzzi: Juluis Mire ?Other Clinician: ?Referring Kaydin Labo: ?Treating Minyon Billiter/Extender: Fredirick Maudlin ?Juluis Mire ?Weeks in Treatment: 9 ?Active Inactive ?Nutrition ?Nursing Diagnoses: ?Impaired glucose  control: actual or potential ?Goals: ?Patient/caregiver verbalizes understanding of need to maintain therapeutic glucose control per primary care physician ?Date Initiated: 01/22/2021 ?Target Resolution Date: 4/3

## 2021-04-02 ENCOUNTER — Other Ambulatory Visit: Payer: Self-pay

## 2021-04-02 ENCOUNTER — Encounter (HOSPITAL_BASED_OUTPATIENT_CLINIC_OR_DEPARTMENT_OTHER): Payer: Medicaid Other | Admitting: Internal Medicine

## 2021-04-02 DIAGNOSIS — L97514 Non-pressure chronic ulcer of other part of right foot with necrosis of bone: Secondary | ICD-10-CM | POA: Diagnosis not present

## 2021-04-03 NOTE — Progress Notes (Signed)
[name], [name] ([id number]) ?Visit Report for 04/02/2021 ?Chief Complaint Document Details ?Patient Name: Date of Service: ?[name] V IS, [name] 04/02/2021 11:00 A M ?Medical Record Number: [id number] ?Patient Account Number: [id number] ?Date of Birth/Sex: Treating RN: ?[birth year] (41 y.o. [name], [name] ?Primary Care Provider: Juluis Mire Other Clinician: ?Referring Provider: ?Treating Provider/Extender: Kalman Shan ?Juluis Mire ?Weeks in Treatment: 10 ?Information Obtained from: Patient ?Chief Complaint ?Osteomyelitis of the right foot status post transmetatarsal amputation with surgical site dehiscence ?Electronic Signature(s) ?Signed: 04/02/2021 12:23:14 PM By: Kalman Shan DO ?Entered By: Kalman Shan on 04/02/2021 12:18:34 ?-------------------------------------------------------------------------------- ?Debridement Details ?Patient Name: Date of Service: ?[name] V IS, [name] 04/02/2021 11:00 A M ?Medical Record Number: [id number] ?Patient Account Number: [id number] ?Date of Birth/Sex: Treating RN: ?1980/06/04 (41 y.o. [name], [name] ?Primary Care Provider: Juluis Mire Other Clinician: ?Referring Provider: ?Treating Provider/Extender: Kalman Shan ?Juluis Mire ?Weeks in Treatment: 10 ?Debridement Performed for Assessment: Wound #1 Right Amputation Site - Transmetatarsal ?Performed By: Physician Kalman Shan, DO ?Debridement Type: Debridement ?Severity of Tissue Pre Debridement: Fat layer exposed ?Level of Consciousness (Pre-procedure): Awake and Alert ?Pre-procedure Verification/Time Out Yes - 11:36 ?Taken: ?Start Time: 11:36 ?Pain Control: Lidocaine ?T Area Debrided (L x W): ?otal 1.6 (cm) x 6 (cm) = 9.6 (cm?) ?Tissue and other material debrided: Viable, Non-Viable, Callus, Slough, Subcutaneous, Slough ?Level: Skin/Subcutaneous Tissue ?Debridement Description: Excisional ?Instrument: Curette ?Bleeding: Minimum ?Hemostasis Achieved: Pressure ?End Time:  11:36 ?Procedural Pain: 0 ?Post Procedural Pain: 0 ?Response to Treatment: Procedure was tolerated well ?Level of Consciousness (Post- Awake and Alert ?procedure): ?Post Debridement Measurements of Total Wound ?Length: (cm) 1.6 ?Width: (cm) 6 ?Depth: (cm) 2.4 ?Volume: (cm?) 18.096 ?Character of Wound/Ulcer Post Debridement: Improved ?Severity of Tissue Post Debridement: Fat layer exposed ?Post Procedure Diagnosis ?Same as Pre-procedure ?Electronic Signature(s) ?Signed: 04/02/2021 12:23:14 PM By: Kalman Shan DO ?Signed: 04/03/2021 5:22:10 PM By: Rhae Hammock RN ?Entered By: Rhae Hammock on 04/02/2021 11:37:58 ?-------------------------------------------------------------------------------- ?HPI Details ?Patient Name: Date of Service: ?[name] V IS, [name] 04/02/2021 11:00 A M ?Medical Record Number: [id number] ?Patient Account Number: [id number] ?Date of Birth/Sex: Treating RN: ?Nov 11, 1980 (41 y.o. [name], [name] ?Primary Care Provider: Juluis Mire Other Clinician: ?Referring Provider: ?Treating Provider/Extender: Kalman Shan ?Juluis Mire ?Weeks in Treatment: 10 ?History of Present Illness ?HPI Description: Admission 01/22/2021 ?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 She has had dehiscence of ?the surgical site. [name] using wet-to-dry dressings. She has a PICC line and receiving IV ceftriaxone daily for her osteomyelitis. There [name] an end date ?of 01/27/2021. [name] also taking oral metronidazole. She currently denies systemic signs of infection. ?1/19; patient presents for follow-up. She was diagnosed with a DVT to the right lower extremity 2 days ago. [name] on Eliquis now. [name] scheduled to see her ?infectious disease doctor  tomorrow. She has been using Dakin's wet-to-dry dressings. She denies systemic signs of infection. ?1/26; patient presents for follow-up. She saw infectious disease on 1/21 started on Augmentin. Her PICC line and IV ceftriaxone was discontinued. Patient ?reports stability to her wound. She has been using Dakin's wet-to-dry dressings. She currently denies systemic signs of infection. ?2/3; patient presents for follow-up. She continues to use Dakin's wet-to-dry dressings to the wound bed. She saw Dr. Yvette Rack with infectious  disease ?yesterday and [name] continuing Augmentin. T entative end date [name] 2/16. Patient reports following up with orthopedics. She states there [name] no further plan from them. ?She currently denies systemic signs of infection. ?2/10; patient presents for follow-up. She continues to use Dakin's wet to dry dressings. [name] scheduled to have her MRI done on 2/14. She states that she ?had pain to the debridement site from last clinic visit and declines debridement today. She denies systemic signs of infection. She continues to have yellow ?thick drainage. ?2/20; patient presents for follow-up. She continues to use Dakin's wet-to-dry dressings. She obtained her MRI. The results showed an abscess and [name] ?scheduled to see her orthopedic surgeon on 2/23. She saw infectious disease 2/17 and her antibiotics were extended. She currently denies systemic signs of ?infection. ?3/6; patient presents for follow-up. She had debridement and irrigation of her foot on 03/10/2021 due to abscess noted on MRI. She was started on IV ?ceftriaxone and oral Flagyl. She has no issues or complaints today. She has been using iodoform packing to the tunnel and Dakin's wet-to-dry to the opening. ?03/26/2021: She continues on IV ceftriaxone and oral metronidazole. She has follow-up with infectious disease tomorrow. No significant issues or complaints ?today. Her mother continues to help her with her wound dressing, using iodoform  packing strips into the tunnel and Dakin's to the open portion of the wound. ?3/20; patient presents for follow-up. She continues to be on IV ceftriaxone in oral metronidazole. She has been using iodoform to the tunnel and Dakin's wet-to- ?dry to the open wound. She denies signs of infection. ?Electronic Signature(s) ?Signed: 04/02/2021 12:23:14 PM By: Kalman Shan DO ?Entered By: Kalman Shan on 04/02/2021 12:19:24 ?-------------------------------------------------------------------------------- ?Physical Exam Details ?Patient Name: Date of Service: ?[name] V IS, [name] 04/02/2021 11:00 A M ?Medical Record Number: [id number] ?Patient Account Number: [id number] ?Date of Birth/Sex: Treating RN: ?[birth year] (41 y.o. [name], [name] ?Primary Care Provider: Other Clinician: ?Juluis Mire ?Referring Provider: ?Treating Provider/Extender: Kalman Shan ?Juluis Mire ?Weeks in Treatment: 10 ?Constitutional ?respirations regular, non-labored and within target range for patient.Marland Kitchen ?Cardiovascular ?2+ dorsalis pedis/posterior tibialis pulses. ?Psychiatric ?pleasant and cooperative. ?Notes ?Right foot with transmetatarsal amputation site that dehisced. There [name] nonviable fat on the medial aspect, but there [name] granulation tissue and epithelialization ?occurring. The lateral aspect tunnel has serosanguineous drainage. No surrounding erythema, induration, or purulent drainage. ?Electronic Signature(s) ?Signed: 04/02/2021 12:23:14 PM By: Kalman Shan DO ?Entered By: Kalman Shan on 04/02/2021 12:20:25 ?-------------------------------------------------------------------------------- ?Physician Orders Details ?Patient Name: Date of Service: ?[name] V IS, [name] 04/02/2021 11:00 A M ?Medical Record Number: [id number] ?Patient Account Number: [id number] ?Date of Birth/Sex: Treating RN: ?[birth year] (41 y.o. [name], [name] ?Primary Care Provider: Juluis Mire Other Clinician: ?Referring  Provider: ?Treating Provider/Extender: Kalman Shan ?Juluis Mire ?Weeks in Treatment: 10 ?Verbal / Phone Orders: No ?Diagnosis Coding ?Follow-up Appointments ?ppointment in 1 week. - with Dr. Heber Peetz (Room 7, [facility]) ?Return

## 2021-04-03 NOTE — Progress Notes (Signed)
Sewell, Reyna L. ([id number]) ?Visit Report for 04/02/2021 ?Arrival Information Details ?Patient Name: Date of Service: ?[name], [name] 04/02/2021 11:00 A M ?Medical Record Number: [id number] ?Patient Account Number: [id number] ?Date of Birth/Sex: Treating RN: ?[birth year] (41 y.o. [name], [name] ?Primary Care Gertrue Willette: Gwinda Passe ?Other Clinician: Haywood Pao ?Referring Sargent Mankey: ?Treating Turki Tapanes/Extender: Geralyn Corwin ?Gwinda Passe ?Weeks in Treatment: 10 ?Visit Information History Since Last Visit ?All ordered tests and consults were completed: Yes ?Patient Arrived: Wheel Chair ?Added or deleted any medications: Yes ?Arrival Time: 10:57 ?Any new allergies or adverse reactions: No ?Accompanied By: self ?Had a fall or experienced change in No ?Transfer Assistance: None ?activities of daily living that may affect ?Patient Requires Transmission-Based No ?risk of falls: ?Precautions: ?Signs or symptoms of abuse/neglect since last visito No ?Patient Has Alerts: Yes ?Hospitalized since last visit: No ?Patient Alerts: Patient on Blood Thinner ?Implantable device outside of the clinic excluding No ?PICC R Arm ?cellular tissue based products placed in the center ?ABI 12/17/20 R=1.08 ?since last visit: ?L=1.13 ?Pain Present Now: Yes ?Electronic Signature(s) ?Signed: 04/03/2021 4:12:57 PM By: Haywood Pao CHT EMT BS ?, , ?Entered By: Haywood Pao on 04/02/2021 11:20:05 ?-------------------------------------------------------------------------------- ?Encounter Discharge Information Details ?Patient Name: Date of Service: ?[name], [name] 04/02/2021 11:00 A M ?Medical Record Number: [id number] ?Patient Account Number: [id number] ?Date of Birth/Sex: Treating RN: ?March 06, 1980 (41 y.o. [name], [name] ?Primary Care Brooks Stotz: Gwinda Passe Other Clinician: ?Referring Namine Beahm: ?Treating Mysty Kielty/Extender: Geralyn Corwin ?Gwinda Passe ?Weeks in Treatment: 10 ?Encounter Discharge  Information Items Post Procedure Vitals ?Discharge Condition: Stable ?Temperature (F): 98.4 ?Ambulatory Status: Wheelchair ?Pulse (bpm): 98 ?Discharge Destination: Home ?Respiratory Rate (breaths/min): 17 ?Transportation: Private Auto ?Blood Pressure (mmHg): 138/84 ?Accompanied By: self ?Schedule Follow-up Appointment: Yes ?Clinical Summary of Care: Patient Declined ?Electronic Signature(s) ?Signed: 04/03/2021 5:22:10 PM By: Fonnie Mu RN ?Entered By: Fonnie Mu on 04/02/2021 11:41:45 ?-------------------------------------------------------------------------------- ?Lower Extremity Assessment Details ?Patient Name: ?Date of Service: ?[name], [name] 04/02/2021 11:00 A M ?Medical Record Number: [id number] ?Patient Account Number: [id number] ?Date of Birth/Sex: ?Treating RN: ?1980/08/29 (41 y.o. [name], [name] ?Primary Care Eyla Tallon: Gwinda Passe ?Other Clinician: Haywood Pao ?Referring Dhyan Noah: ?Treating Ashyla Luth/Extender: Geralyn Corwin ?Gwinda Passe ?Weeks in Treatment: 10 ?Edema Assessment ?Assessed: [Left: No] [Right: Yes] ?Edema: [Left: N] [Right: o] ?Calf ?Left: Right: ?Point of Measurement: 34 cm From Medial Instep 45.5 cm ?Ankle ?Left: Right: ?Point of Measurement: 8 cm From Medial Instep 25 cm ?Vascular Assessment ?Pulses: ?Dorsalis Pedis ?Palpable: [Right:Yes] ?Doppler Audible: [Right:Yes] ?Posterior Tibial ?Palpable: [Right:Yes] ?Electronic Signature(s) ?Signed: 04/03/2021 5:22:10 PM By: Fonnie Mu RN ?Entered By: Fonnie Mu on 04/02/2021 11:26:44 ?-------------------------------------------------------------------------------- ?Multi Wound Chart Details ?Patient Name: ?Date of Service: ?[name], [name] 04/02/2021 11:00 A M ?Medical Record Number: [id number] ?Patient Account Number: [id number] ?Date of Birth/Sex: ?Treating RN: ?1980/08/12 (41 y.o. [name], [name] ?Primary Care Tomeka Kantner: Gwinda Passe ?Other Clinician: ?Referring  Anaiz Qazi: ?Treating Cherre Kothari/Extender: Geralyn Corwin ?Gwinda Passe ?Weeks in Treatment: 10 ?Vital Signs ?Height(in): 69 ?Pulse(bpm): 91 ?Weight(lbs): ?Blood Pressure(mmHg): 97/66 ?Body Mass Index(BMI): ?Temperature(??F): 98.2 ?Respiratory Rate(breaths/min): 18 ?Photos: [1:No Photos Right Amputation Site -] [N/A:N/A N/A] ?Wound Location: [1:Transmetatarsal Surgical Injury] [N/A:N/A] ?Wounding Event: [1:Diabetic Wound/Ulcer of the Lower] [N/A:N/A] ?Primary Etiology: [1:Extremity Hypertension, Type II Diabetes,] [N/A:N/A] ?Comorbid History: [1:Osteomyelitis, Neuropathy 12/15/2020] [N/A:N/A] ?Date Acquired: [1:10] [N/A:N/A] ?Weeks of Treatment: [1:Open] [N/A:N/A] ?Wound Status: [1:No] [N/A:N/A] ?Wound Recurrence: [1:1.6x6x2.4] [N/A:N/A] ?Measurements L x W x D (cm) [1:7.54] [N/A:N/A] ?A (cm?) : ?rea [1:18.096] [  N/A:N/A] ?Volume (cm?) : [1:82.30%] [N/A:N/A] ?% Reduction in A [1:rea: 73.40%] [N/A:N/A] ?% Reduction in Volume: [1:6] ?Position 1 (o'clock): [1:3.5] ?Maximum Distance 1 (cm): [1:Yes] [N/A:N/A] ?Tunneling: [1:Grade 3] [N/A:N/A] ?Classification: [1:Medium] [N/A:N/A] ?Exudate A mount: [1:Serosanguineous] [N/A:N/A] ?Exudate Type: [1:red, brown] [N/A:N/A] ?Exudate Color: [1:Thickened] [N/A:N/A] ?Wound Margin: [1:Medium (34-66%)] [N/A:N/A] ?Granulation A mount: [1:Red, Pink] [N/A:N/A] ?Granulation Quality: [1:Medium (34-66%)] [N/A:N/A] ?Necrotic A mount: ?[1:Fat Layer (Subcutaneous Tissue): Yes N/A] ?Exposed Structures: ?[1:Fascia: No Tendon: No Muscle: No Joint: No Bone: No None] [N/A:N/A] ?Epithelialization: [1:Debridement - Excisional] [N/A:N/A] ?Debridement: ?Pre-procedure Verification/Time Out 11:36 [N/A:N/A] ?Taken: [1:Lidocaine] [N/A:N/A] ?Pain Control: [1:Callus, Subcutaneous, Slough] [N/A:N/A] ?Tissue Debrided: [1:Skin/Subcutaneous Tissue] [N/A:N/A] ?Level: [1:9.6] [N/A:N/A] ?Debridement A (sq cm): [1:rea Curette] [N/A:N/A] ?Instrument: [1:Minimum] [N/A:N/A] ?Bleeding: [1:Pressure]  [N/A:N/A] ?Hemostasis A chieved: [1:0] [N/A:N/A] ?Procedural Pain: [1:0] [N/A:N/A] ?Post Procedural Pain: [1:Procedure was tolerated well] [N/A:N/A] ?Debridement Treatment Response: [1:1.6x6x2.4] [N/A:N/A] ?Post Debridement Measurements L x ?W x D (cm) [1:18.096] [N/A:N/A] ?Post Debridement Volume: (cm?) [1:Debridement] [N/A:N/A] ?Treatment Notes ?Wound #1 (Amputation Site - Transmetatarsal) Wound Laterality: Right ?Cleanser ?Normal Saline ?Discharge Instruction: Cleanse the wound with Normal Saline prior to applying a clean dressing using gauze sponges, not tissue or cotton balls. ?Peri-Wound Care ?Topical ?Dakin's solution ?Discharge Instruction: Moisten Gauze with Dakins ?Primary Dressing ?Iodoform packing strip 1/4 (in) ?Discharge Instruction: Lightly pack into tunnel ?Secondary Dressing ?Woven Gauze Sponge, Non-Sterile 4x4 in ?Discharge Instruction: Apply over primary dressing as directed. ?ABD Pad, 5x9 ?Discharge Instruction: Apply over primary dressing as directed. ?Secured With ?Elastic Bandage 4 inch (ACE bandage) ?Discharge Instruction: Secure with ACE bandage as directed. ?Kerlix Roll Sterile, 4.5x3.1 (in/yd) ?Discharge Instruction: Secure with Kerlix as directed. ?41M Medipore H Soft Cloth Surgical T ape, 4 x 10 (in/yd) ?Discharge Instruction: Secure with tape as directed. ?Compression Wrap ?Compression Stockings ?Add-Ons ?Electronic Signature(s) ?Signed: 04/02/2021 12:23:14 PM By: Geralyn Corwin DO ?Signed: 04/03/2021 5:22:10 PM By: Fonnie Mu RN ?Entered By: Geralyn Corwin on 04/02/2021 12:18:22 ?-------------------------------------------------------------------------------- ?Multi-Disciplinary Care Plan Details ?Patient Name: ?Date of Service: ?[name], [name] 04/02/2021 11:00 A M ?Medical Record Number: [id number] ?Patient Account Number: [id number] ?Date of Birth/Sex: ?Treating RN: ?02-26-1980 (41 y.o. [name], [name] ?Primary Care Cassiel Fernandez: Gwinda Passe ?Other  Clinician: ?Referring Ayodele Sangalang: ?Treating Jujhar Everett/Extender: Geralyn Corwin ?Gwinda Passe ?Weeks in Treatment: 10 ?Active Inactive ?Nutrition ?Nursing Diagnoses: ?Impaired glucose control: actual or potential ?Goals: ?Patient/caregiver verbalizes und

## 2021-04-04 ENCOUNTER — Other Ambulatory Visit: Payer: Self-pay

## 2021-04-04 ENCOUNTER — Telehealth: Payer: Self-pay | Admitting: *Deleted

## 2021-04-04 NOTE — Telephone Encounter (Signed)
Copied from CRM [id number]. Topic: General - Other ?>> Apr 04, 2021 12:08 PM Gaetana Michaelis A wrote: ?Reason for CRM: The patient would like to speak directly with their PCP or a member of clinical staff regaridng their medications and alternative prescriptions ? ?The patient would like to further discuss the medical differences between Ozempic and Trulicity  ? ?Please contact further ?

## 2021-04-04 NOTE — Telephone Encounter (Signed)
Connected with the patient. Explained that Trulicity and Ozempic are in the same medication class. They both offer weight loss in addition to blood sugar control. Additionally, bother Ozempic and Trulicity have been shown to offer cardiovascular benefit. Side effect profiles between the two medications are very similar and we manage side effects the same way with each medication. Ie. We counsel patients to eat smaller meals to avoid GI upset.  ? ?Pt verbalized understanding and was thankful for my call. She agrees to take the Trulicity as prescribed.  ?

## 2021-04-09 ENCOUNTER — Other Ambulatory Visit: Payer: Self-pay

## 2021-04-09 ENCOUNTER — Encounter (HOSPITAL_BASED_OUTPATIENT_CLINIC_OR_DEPARTMENT_OTHER): Payer: Medicaid Other | Admitting: Internal Medicine

## 2021-04-09 DIAGNOSIS — L97514 Non-pressure chronic ulcer of other part of right foot with necrosis of bone: Secondary | ICD-10-CM

## 2021-04-09 DIAGNOSIS — E11621 Type 2 diabetes mellitus with foot ulcer: Secondary | ICD-10-CM | POA: Diagnosis not present

## 2021-04-09 LAB — FUNGUS CULTURE WITH STAIN

## 2021-04-09 LAB — FUNGAL ORGANISM REFLEX

## 2021-04-09 LAB — FUNGUS CULTURE RESULT

## 2021-04-09 NOTE — Progress Notes (Signed)
Christiano, [name] ([id number]) ?Visit Report for 04/09/2021 ?Arrival Information Details ?Patient Name: Date of Service: ?[name], [name] 04/09/2021 11:00 A M ?Medical Record Number: [id number] ?Patient Account Number: [id number] ?Date of Birth/Sex: Treating RN: ?1980-01-21 (41 y.o. [name] ?Primary Care Camara Renstrom: Juluis Mire Other Clinician: ?Referring Gabriella Guile: ?Treating Kyaira Trantham/Extender: Kalman Shan ?Juluis Mire ?Weeks in Treatment: 11 ?Visit Information History Since Last Visit ?Added or deleted any medications: No ?Patient Arrived: Wheel Chair ?Any new allergies or adverse reactions: No ?Arrival Time: 11:02 ?Had a fall or experienced change in No ?Transfer Assistance: None ?activities of daily living that may affect ?Patient Identification Verified: Yes ?risk of falls: ?Secondary Verification Process Completed: Yes ?Signs or symptoms of abuse/neglect since last visito No ?Patient Requires Transmission-Based No ?Hospitalized since last visit: No ?Precautions: ?Implantable device outside of the clinic excluding No ?Patient Has Alerts: Yes ?cellular tissue based products placed in the center ?Patient Alerts: Patient on Blood Thinner ?since last visit: ?PICC R Arm ?Has Dressing in Place as Prescribed: Yes ?ABI 12/17/20 R=1.08 ?Pain Present Now: No ?L=1.13 ?Electronic Signature(s) ?Signed: 04/09/2021 4:27:43 PM By: Lorrin Jackson ?Entered By: Lorrin Jackson on 04/09/2021 11:02:38 ?-------------------------------------------------------------------------------- ?Encounter Discharge Information Details ?Patient Name: Date of Service: ?[name], [name] 04/09/2021 11:00 A M ?Medical Record Number: [id number] ?Patient Account Number: [id number] ?Date of Birth/Sex: Treating RN: ?August 01, 1980 (41 y.o. [name] ?Primary Care Safira Proffit: Juluis Mire Other Clinician: ?Referring Keanna Tugwell: ?Treating Tracer Gutridge/Extender: Kalman Shan ?Juluis Mire ?Weeks in Treatment: 11 ?Encounter  Discharge Information Items Post Procedure Vitals ?Discharge Condition: Stable ?Temperature (F): 98.1 ?Ambulatory Status: Wheelchair ?Pulse (bpm): 92 ?Discharge Destination: Home ?Respiratory Rate (breaths/min): 18 ?Transportation: Private Auto ?Blood Pressure (mmHg): 111/73 ?Schedule Follow-up Appointment: Yes ?Clinical Summary of Care: Provided on 04/09/2021 ?Form Type Recipient ?Paper Patient Patient ?Electronic Signature(s) ?Signed: 04/09/2021 4:27:43 PM By: Lorrin Jackson ?Entered By: Lorrin Jackson on 04/09/2021 11:55:53 ?-------------------------------------------------------------------------------- ?Lower Extremity Assessment Details ?Patient Name: ?Date of Service: ?[name], [name] 04/09/2021 11:00 A M ?Medical Record Number: [id number] ?Patient Account Number: [id number] ?Date of Birth/Sex: ?Treating RN: ?[birth year] (41 y.o. [name] ?Primary Care Anaise Sterbenz: Juluis Mire ?Other Clinician: ?Referring Neleh Muldoon: ?Treating Verbon Giangregorio/Extender: Kalman Shan ?Juluis Mire ?Weeks in Treatment: 11 ?Edema Assessment ?Assessed: [Left: No] [Right: Yes] ?Edema: [Left: N] [Right: o] ?Calf ?Left: Right: ?Point of Measurement: 34 cm From Medial Instep 45 cm ?Ankle ?Left: Right: ?Point of Measurement: 8 cm From Medial Instep 25 cm ?Vascular Assessment ?Pulses: ?Dorsalis Pedis ?Palpable: [Right:Yes] ?Electronic Signature(s) ?Signed: 04/09/2021 4:27:43 PM By: Lorrin Jackson ?Entered By: Lorrin Jackson on 04/09/2021 11:25:00 ?-------------------------------------------------------------------------------- ?Multi Wound Chart Details ?Patient Name: ?Date of Service: ?[name], [name] 04/09/2021 11:00 A M ?Medical Record Number: [id number] ?Patient Account Number: [id number] ?Date of Birth/Sex: ?Treating RN: ?06-14-1980 (41 y.o. F) ?Primary Care Rhia Blatchford: Juluis Mire ?Other Clinician: ?Referring Roel Douthat: ?Treating Gloriana Piltz/Extender: Kalman Shan ?Juluis Mire ?Weeks in Treatment: 11 ?Vital  Signs ?Height(in): 69 ?Pulse(bpm): 92 ?Weight(lbs): ?Blood Pressure(mmHg): 111/73 ?Body Mass Index(BMI): ?Temperature(??F): 98.1 ?Respiratory Rate(breaths/min): 18 ?Photos: [1:Right Amputation Site -] [N/A:N/A N/A] ?Wound Location: [1:Transmetatarsal Surgical Injury] [N/A:N/A] ?Wounding Event: [1:Diabetic Wound/Ulcer of the Lower] [N/A:N/A] ?Primary Etiology: [1:Extremity Hypertension, Type II Diabetes,] [N/A:N/A] ?Comorbid History: [1:Osteomyelitis, Neuropathy 12/15/2020] [N/A:N/A] ?Date Acquired: [1:11] [N/A:N/A] ?Weeks of Treatment: [1:Open] [N/A:N/A] ?Wound Status: [1:No] [N/A:N/A] ?Wound Recurrence: [1:1.9x6x2] [N/A:N/A] ?Measurements L x W x D (cm) [1:8.954] [N/A:N/A] ?A (cm?) : ?rea [1:17.907] [N/A:N/A] ?Volume (cm?) : [1:79.00%] [N/A:N/A] ?% Reduction in A [1:rea: 73.70%] [N/A:N/A] ?%  Reduction in Volume: [1:Grade 3] [N/A:N/A] ?Classification: [1:Medium] [N/A:N/A] ?Exudate A mount: [1:Serosanguineous] [N/A:N/A] ?Exudate Type: [1:red, brown] [N/A:N/A] ?Exudate Color: [1:Thickened] [N/A:N/A] ?Wound Margin: [1:Medium (34-66%)] [N/A:N/A] ?Granulation A mount: [1:Red, Pink] [N/A:N/A] ?Granulation Quality: [1:Medium (34-66%)] [N/A:N/A] ?Necrotic A mount: ?[1:Fat Layer (Subcutaneous Tissue): Yes N/A] ?Exposed Structures: ?[1:Fascia: No Tendon: No Muscle: No Joint: No Bone: No None] [N/A:N/A] ?Epithelialization: [1:Debridement - Excisional] [N/A:N/A] ?Debridement: ?Pre-procedure Verification/Time Out 11:33 [N/A:N/A] ?Taken: [1:Other] [N/A:N/A] ?Pain Control: [1:Subcutaneous, Slough] [N/A:N/A] ?Tissue Debrided: [1:Skin/Subcutaneous Tissue] [N/A:N/A] ?Level: [1:11.4] [N/A:N/A] ?Debridement A (sq cm): [1:rea Curette] [N/A:N/A] ?Instrument: [1:Minimum] [N/A:N/A] ?Bleeding: [1:Pressure] [N/A:N/A] ?Hemostasis A chieved: [1:Procedure was tolerated well] [N/A:N/A] ?Debridement Treatment Response: [1:1.9x6x2] [N/A:N/A] ?Post Debridement Measurements L x ?W x D (cm) [1:17.907] [N/A:N/A] ?Post Debridement Volume: (cm?)  [1:Debridement] [N/A:N/A] ?Treatment Notes ?Wound #1 (Amputation Site - Transmetatarsal) Wound Laterality: Right ?Cleanser ?Normal Saline ?Discharge Instruction: Cleanse the wound with Normal Saline prior to applying a clean dressing using gauze sponges, not tissue or cotton balls. ?Peri-Wound Care ?Topical ?Primary Dressing ?Hydrofera Blue Ready Foam, 4x5 in ?Discharge Instruction: Apply to wound bed as instructed ?Iodoform packing strip 1/4 (in) ?Discharge Instruction: Lightly pack into tunnel ?Secondary Dressing ?Woven Gauze Sponge, Non-Sterile 4x4 in ?Discharge Instruction: Apply over primary dressing as directed. ?ABD Pad, 5x9 ?Discharge Instruction: Apply over primary dressing as directed. ?Secured With ?Elastic Bandage 4 inch (ACE bandage) ?Discharge Instruction: Secure with ACE bandage as directed. ?Kerlix Roll Sterile, 4.5x3.1 (in/yd) ?Discharge Instruction: Secure with Kerlix as directed. ?43M Medipore H Soft Cloth Surgical T ape, 4 x 10 (in/yd) ?Discharge Instruction: Secure with tape as directed. ?Compression Wrap ?Compression Stockings ?Add-Ons ?Electronic Signature(s) ?Signed: 04/09/2021 12:12:07 PM By: Kalman Shan DO ?Entered By: Kalman Shan on 04/09/2021 12:05:59 ?-------------------------------------------------------------------------------- ?Multi-Disciplinary Care Plan Details ?Patient Name: ?Date of Service: ?[name], [name] 04/09/2021 11:00 A M ?Medical Record Number: [id number] ?Patient Account Number: [id number] ?Date of Birth/Sex: ?Treating RN: ?Dec 24, 1980 (41 y.o. [name] ?Primary Care Kealan Buchan: Juluis Mire ?Other Clinician: ?Referring Jaydan Meidinger: ?Treating Marguerite Barba/Extender: Kalman Shan ?Juluis Mire ?Weeks in Treatment: 11 ?Active Inactive ?Nutrition ?Nursing Diagnoses: ?Impaired glucose control: actual or potential ?Goals: ?Patient/caregiver verbalizes understanding of need to maintain therapeutic glucose control per primary care physician ?Date Initiated:  01/22/2021 ?Target Resolution Date: 04/16/2021 ?Goal Status: Active ?Interventions: ?Assess HgA1c results as ordered upon admission and as needed ?Provide education on elevated blood sugars and impact on wound healing ?Trea

## 2021-04-09 NOTE — Progress Notes (Signed)
[name], [name] ([id number]) ?Visit Report for 04/09/2021 ?Chief Complaint Document Details ?Patient Name: Date of Service: ?[name], [name] 04/09/2021 11:00 A M ?Medical Record Number: [id number] ?Patient Account Number: [id number] ?Date of Birth/Sex: Treating RN: ?16-Dec-1980 (41 y.o. 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 04/09/2021 11:00 A M ?Medical Record Number: [id number] ?Patient Account Number: [id number] ?Date of Birth/Sex: Treating RN: ?Aug 20, 1980 (41 y.o. [name] ?Primary Care Provider: Gwinda Passe Other Clinician: ?Referring Provider: ?Treating Provider/Extender: Geralyn Corwin ?[name] Passe ?Weeks in Treatment: 11 ?Debridement Performed for Assessment: Wound #1 Right Amputation Site - Transmetatarsal ?Performed By: Physician Geralyn Corwin, DO ?Debridement Type: Debridement ?Severity of Tissue Pre Debridement: Fat layer exposed ?Level of Consciousness (Pre-procedure): Awake and Alert ?Pre-procedure Verification/Time Out Yes - 11:33 ?Taken: ?Start Time: 11:34 ?Pain Control: ?Other : Benzocaine ?T Area Debrided (L x W): ?otal 1.9 (cm) x 6 (cm) = 11.4 (cm?) ?Tissue and other material debrided: Non-Viable, Slough, Subcutaneous, Slough ?Level: Skin/Subcutaneous Tissue ?Debridement Description: Excisional ?Instrument: Curette ?Bleeding: Minimum ?Hemostasis Achieved: Pressure ?End Time: 11:38 ?Response to Treatment:  Procedure was tolerated well ?Level of Consciousness (Post- Awake and Alert ?procedure): ?Post Debridement Measurements of Total Wound ?Length: (cm) 1.9 ?Width: (cm) 6 ?Depth: (cm) 2 ?Volume: (cm?) 17.907 ?Character of Wound/Ulcer Post Debridement: Stable ?Severity of Tissue Post Debridement: Fat layer exposed ?Post Procedure Diagnosis ?Same as Pre-procedure ?Electronic Signature(s) ?Signed: 04/09/2021 12:12:07 PM By: Geralyn Corwin DO ?Signed: 04/09/2021 4:27:43 PM By: Antonieta Iba ?Entered By: Antonieta Iba on 04/09/2021 11:39:05 ?-------------------------------------------------------------------------------- ?HPI Details ?Patient Name: Date of Service: ?[name], [name] 04/09/2021 11:00 A M ?Medical Record Number: [id number] ?Patient Account Number: [id number] ?Date of Birth/Sex: Treating RN: ?10/08/1980 (41 y.o. F) ?Primary Care Provider: Gwinda Passe Other Clinician: ?Referring Provider: ?Treating Provider/Extender: Geralyn Corwin ?[name] Passe ?Weeks in Treatment: 11 ?History of Present Illness ?HPI Description: Admission 01/22/2021 ?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 She has had dehiscence of ?the surgical site. [name] using wet-to-dry dressings. She has a PICC line and receiving IV ceftriaxone daily for her osteomyelitis. There [name] an end date ?of 01/27/2021. [name] also taking oral metronidazole. She currently denies systemic signs of infection. ?1/19; patient presents for follow-up. She was diagnosed with a DVT to the right lower extremity 2 days ago. [name] on Eliquis now. [name] scheduled to see her ?infectious disease doctor tomorrow. She has been using Dakin's wet-to-dry dressings. She denies systemic signs of infection. ?1/26;  patient presents for follow-up. She saw infectious disease on 1/21 started on Augmentin. Her PICC line and IV ceftriaxone was discontinued. Patient ?reports stability to her wound. She has been using Dakin's wet-to-dry dressings. She currently denies systemic signs of infection. ?2/3; patient presents for follow-up. She continues to use Dakin's wet-to-dry dressings to the wound bed. She saw Dr. Manson [name] with infectious disease ?yesterday and [name] continuing Augmentin. T entative end date [name] 2/16.  Patient reports following up with orthopedics. She states there [name] no further plan from them. ?She currently denies systemic signs of infection. ?2/10; patient presents for follow-up. She continues to use Dakin's wet to dry dressings. [name] scheduled to have her MRI done on 2/14. She states that she ?had pain to the debridement site from last clinic visit and declines debridement today. She denies systemic signs of infection. She continues to have yellow ?thick drainage. ?2/20; patient presents for follow-up. She continues to use Dakin's wet-to-dry dressings. She obtained her MRI. The results showed an abscess and [name] ?scheduled to see her orthopedic surgeon on 2/23. She saw infectious disease 2/17 and her antibiotics were extended. She currently denies systemic signs of ?infection. ?3/6; patient presents for follow-up. She had debridement and irrigation of her foot on 03/10/2021 due to abscess noted on MRI. She was started on IV ?ceftriaxone and oral Flagyl. She has no issues or complaints today. She has been using iodoform packing to the tunnel and Dakin's wet-to-dry to the opening. ?03/26/2021: She continues on IV ceftriaxone and oral metronidazole. She has follow-up with infectious disease tomorrow. No significant issues or complaints ?today. Her mother continues to help her with her wound dressing, using iodoform packing strips into the tunnel and Dakin's to the open portion of the wound. ?3/20; patient presents for  follow-up. She continues to be on IV ceftriaxone in oral metronidazole. She has been using iodoform to the tunnel and Dakin's wet-to- ?dry to the open wound. She denies signs of infection. ?3/27; patient presents for follow-up. She no longer has a PICC line. She has been using iodoform to the tunnel and Dakin's wet-to-dry to the open wound. She ?reports improvement in wound healing. She denies signs of infection. ?Electronic Signature(s) ?Signed: 04/09/2021 12:12:07 PM By: Geralyn Corwin DO ?Entered By: Geralyn Corwin on 04/09/2021 12:07:54 ?-------------------------------------------------------------------------------- ?Physical Exam Details ?Patient Name: Date of Service: ?[name], [name] 04/09/2021 11:00 A M ?Medical Record Number: [id number] ?Patient Account Number: [id number] ?Date of Birth/Sex: Treating RN: ?1980-07-31 (41 y.o. F) ?Primary Care Provider: Gwinda Passe Other Clinician: ?Referring Provider: ?Treating Provider/Extender: Geralyn Corwin ?[name] Passe ?Weeks in Treatment: 11 ?Constitutional ?respirations regular, non-labored and within target range for patient.Marland Kitchen ?Cardiovascular ?2+ dorsalis pedis/posterior tibialis pulses. ?Psychiatric ?pleasant and cooperative. ?Notes ?Right foot with transmetatarsal amputation that dehisced. There are some nonviable tissue present with granulation tissue and epithelialization occurring. T the ?o ?lateral aspect there [name] a tunnel with minimal serosanguineous drainage. No surrounding erythema, induration or purulent drainage. ?Electronic Signature(s) ?Signed: 04/09/2021 12:12:07 PM By: Geralyn Corwin DO ?Entered By: Geralyn Corwin on 04/09/2021 12:09:04 ?-------------------------------------------------------------------------------- ?Physician Orders Details ?Patient Name: Date of Service: ?[name], [name] 04/09/2021 11:00 A M ?Medical Record Number: [id number] ?Patient Account Number: [id number] ?Date of Birth/Sex: Treating RN: ?[birth year] (41  y.o. [name] ?Primary Care Provider: Gwinda Passe Other Clinician: ?Referring Provider: ?Treating Provider/Extender: Geralyn Corwin ?[name] Passe ?Weeks in Treatment: 11 ?Verbal / Phone Orders:

## 2021-04-13 ENCOUNTER — Other Ambulatory Visit: Payer: Self-pay

## 2021-04-16 ENCOUNTER — Other Ambulatory Visit: Payer: Self-pay

## 2021-04-16 ENCOUNTER — Encounter (HOSPITAL_BASED_OUTPATIENT_CLINIC_OR_DEPARTMENT_OTHER): Payer: Medicaid Other | Attending: Internal Medicine | Admitting: Internal Medicine

## 2021-04-16 DIAGNOSIS — E11621 Type 2 diabetes mellitus with foot ulcer: Secondary | ICD-10-CM | POA: Insufficient documentation

## 2021-04-16 DIAGNOSIS — L97514 Non-pressure chronic ulcer of other part of right foot with necrosis of bone: Secondary | ICD-10-CM | POA: Insufficient documentation

## 2021-04-16 DIAGNOSIS — Z7901 Long term (current) use of anticoagulants: Secondary | ICD-10-CM | POA: Insufficient documentation

## 2021-04-16 DIAGNOSIS — Z89431 Acquired absence of right foot: Secondary | ICD-10-CM | POA: Insufficient documentation

## 2021-04-16 DIAGNOSIS — M86171 Other acute osteomyelitis, right ankle and foot: Secondary | ICD-10-CM | POA: Diagnosis not present

## 2021-04-16 DIAGNOSIS — Z86718 Personal history of other venous thrombosis and embolism: Secondary | ICD-10-CM | POA: Insufficient documentation

## 2021-04-16 DIAGNOSIS — Z794 Long term (current) use of insulin: Secondary | ICD-10-CM | POA: Insufficient documentation

## 2021-04-16 NOTE — Progress Notes (Signed)
Delorey, Chavy L. ([id number]) ?Visit Report for 04/16/2021 ?Chief Complaint Document Details ?Patient Name: Date of Service: ?[name], [name] 04/16/2021 12:30 PM ?Medical Record Number: [id number] ?Patient Account Number: [id number] ?Date of Birth/Sex: Treating RN: ?[birth year] (41 y.o. [name] ?Primary Care Provider: Gwinda [name] Other Clinician: ?Referring Provider: ?Treating Provider/Extender: [name] ?[name] ?Weeks in Treatment: 12 ?Information Obtained from: Patient ?Chief Complaint ?Osteomyelitis of the right foot status post transmetatarsal amputation with surgical site dehiscence ?Electronic Signature(s) ?Signed: 04/16/2021 1:41:40 PM By: [name] Corwin DO ?Entered By: [name] on 04/16/2021 13:32:12 ?-------------------------------------------------------------------------------- ?Debridement Details ?Patient Name: Date of Service: ?[name], [name] 04/16/2021 12:30 PM ?Medical Record Number: [id number] ?Patient Account Number: [id number] ?Date of Birth/Sex: Treating RN: ?1980-07-06 (41 y.o. [name] ?Primary Care Provider: Gwinda [name] Other Clinician: ?Referring Provider: ?Treating Provider/Extender: [name] ?[name] ?Weeks in Treatment: 12 ?Debridement Performed for Assessment: Wound #1 Right Amputation Site - Transmetatarsal ?Performed By: Physician [name] Corwin, DO ?Debridement Type: Debridement ?Severity of Tissue Pre Debridement: Fat layer exposed ?Level of Consciousness (Pre-procedure): Awake and Alert ?Pre-procedure Verification/Time Out Yes - 13:02 ?Taken: ?Start Time: 13:03 ?Pain Control: ?Other : Benzocaine ?T Area Debrided (L x W): ?otal 1.5 (cm) x 5.8 (cm) = 8.7 (cm?) ?Tissue and other material debrided: Non-Viable, Slough, Subcutaneous, Slough ?Level: Skin/Subcutaneous Tissue ?Debridement Description: Excisional ?Instrument: Curette ?Bleeding: Minimum ?Hemostasis Achieved: Pressure ?End Time: 13:09 ?Response to  Treatment: Procedure was tolerated well ?Level of Consciousness (Post- Awake and Alert ?procedure): ?Post Debridement Measurements of Total Wound ?Length: (cm) 1.5 ?Width: (cm) 5.8 ?Depth: (cm) 1.8 ?Volume: (cm?) 12.299 ?Character of Wound/Ulcer Post Debridement: Stable ?Severity of Tissue Post Debridement: Fat layer exposed ?Post Procedure Diagnosis ?Same as Pre-procedure ?Electronic Signature(s) ?Signed: 04/16/2021 1:41:40 PM By: [name] Corwin DO ?Signed: 04/16/2021 4:45:02 PM By: [name] ?Entered By: [name] on 04/16/2021 13:10:11 ?-------------------------------------------------------------------------------- ?HPI Details ?Patient Name: Date of Service: ?[name], [name] 04/16/2021 12:30 PM ?Medical Record Number: [id number] ?Patient Account Number: [id number] ?Date of Birth/Sex: Treating RN: ?1980/09/17 (41 y.o. [name] ?Primary Care Provider: Gwinda [name] Other Clinician: ?Referring Provider: ?Treating Provider/Extender: [name] ?[name] ?Weeks in Treatment: 12 ?History of Present Illness ?HPI Description: Admission 01/22/2021 ?[name] [name] a 41 year old female with a past medical history of insulin-dependent uncontrolled type 2 diabetes with last hemoglobin A1c of 13.5, ?osteomyelitis of the right foot status post transmetatarsal amputation on 12/18/2020 that presents to the clinic for right foot wound. She has had dehiscence of ?the surgical site. [name] using wet-to-dry dressings. She has a PICC line and receiving IV ceftriaxone daily for her osteomyelitis. There [name] an end date ?of 01/27/2021. [name] also taking oral metronidazole. She currently denies systemic signs of infection. ?1/19; patient presents for follow-up. She was diagnosed with a DVT to the right lower extremity 2 days ago. [name] on Eliquis now. [name] scheduled to see her ?infectious disease doctor tomorrow. She has been using Dakin's wet-to-dry dressings. She denies systemic signs  of infection. ?1/26; patient presents for follow-up. She saw infectious disease on 1/21 started on Augmentin. Her PICC line and IV ceftriaxone was discontinued. Patient ?reports stability to her wound. She has been using Dakin's wet-to-dry dressings. She currently denies systemic signs of infection. ?2/3; patient presents for follow-up. She continues to use Dakin's wet-to-dry dressings to the wound bed. She saw Dr. Manson Passey with infectious disease ?yesterday and [name] continuing Augmentin. T entative end date [name]  2/16. Patient reports following up with orthopedics. She states there [name] no further plan from them. ?She currently denies systemic signs of infection. ?2/10; patient presents for follow-up. She continues to use Dakin's wet to dry dressings. [name] scheduled to have her MRI done on 2/14. She states that she ?had pain to the debridement site from last clinic visit and declines debridement today. She denies systemic signs of infection. She continues to have yellow ?thick drainage. ?2/20; patient presents for follow-up. She continues to use Dakin's wet-to-dry dressings. She obtained her MRI. The results showed an abscess and [name] ?scheduled to see her orthopedic surgeon on 2/23. She saw infectious disease 2/17 and her antibiotics were extended. She currently denies systemic signs of ?infection. ?3/6; patient presents for follow-up. She had debridement and irrigation of her foot on 03/10/2021 due to abscess noted on MRI. She was started on IV ?ceftriaxone and oral Flagyl. She has no issues or complaints today. She has been using iodoform packing to the tunnel and Dakin's wet-to-dry to the opening. ?03/26/2021: She continues on IV ceftriaxone and oral metronidazole. She has follow-up with infectious disease tomorrow. No significant issues or complaints ?today. Her mother continues to help her with her wound dressing, using iodoform packing strips into the tunnel and Dakin's to the open portion of the wound. ?3/20;  patient presents for follow-up. She continues to be on IV ceftriaxone in oral metronidazole. She has been using iodoform to the tunnel and Dakin's wet-to- ?dry to the open wound. She denies signs of infection. ?3/27; patient presents for follow-up. She no longer has a PICC line. She has been using iodoform to the tunnel and Dakin's wet-to-dry to the open wound. She ?reports improvement in wound healing. She denies signs of infection. ?4/3; patient presents for follow-up. She states she has been using Hydrofera Blue to the open wound and iodoform packing to the tunnel without any issues. ?She denies signs of infection. ?Electronic Signature(s) ?Signed: 04/16/2021 1:41:40 PM By: [name] Corwin DO ?Entered By: [name] on 04/16/2021 13:32:45 ?-------------------------------------------------------------------------------- ?Physical Exam Details ?Patient Name: Date of Service: ?[name], [name] 04/16/2021 12:30 PM ?Medical Record Number: [id number] ?Patient Account Number: [id number] ?Date of Birth/Sex: Treating RN: ?1980-11-28 (41 y.o. [name] ?Primary Care Provider: Gwinda [name] Other Clinician: ?Referring Provider: ?Treating Provider/Extender: [name] ?[name] ?Weeks in Treatment: 12 ?Constitutional ?respirations regular, non-labored and within target range for patient.Marland Kitchen ?Cardiovascular ?2+ dorsalis pedis/posterior tibialis pulses. ?Psychiatric ?pleasant and cooperative. ?Notes ?Right foot with transmetatarsal amputation that dehisced. There are some nonviable tissue present with granulation tissue and epithelialization occurring. T the ?o ?lateral aspect there [name] a tunnel with minimal serosanguineous drainage. No surrounding erythema, induration or purulent drainage. ?Electronic Signature(s) ?Signed: 04/16/2021 1:41:40 PM By: [name] Corwin DO ?Entered By: [name] on 04/16/2021  13:37:56 ?-------------------------------------------------------------------------------- ?Physician Orders Details ?Patient Name: Date of Service: ?[name], [name] 04/16/2021 12:30 PM ?Medical Record Number: [id number] ?Patient Account Number: [id number] ?Date of Birth/Sex: Treating RN: ?[birth year] (3

## 2021-04-16 NOTE — Progress Notes (Signed)
Guest, Wonda L. ([id number]) ?Visit Report for 04/16/2021 ?Arrival Information Details ?Patient Name: Date of Service: ?[name] V IS, [name] 04/16/2021 12:30 PM ?Medical Record Number: [id number] ?Patient Account Number: [id number] ?Date of Birth/Sex: Treating RN: ?[birth year] (41 y.o. [name] ?Primary Care Marchello Rothgeb: Gwinda Passe Other Clinician: ?Referring Meliana Canner: ?Treating Effie Wahlert/Extender: Geralyn Corwin ?Gwinda Passe ?Weeks in Treatment: 12 ?Visit Information History Since Last Visit ?Added or deleted any medications: No ?Patient Arrived: Wheel Chair ?Any new allergies or adverse reactions: No ?Arrival Time: 12:51 ?Had a fall or experienced change in No ?Transfer Assistance: None ?activities of daily living that may affect ?Patient Identification Verified: Yes ?risk of falls: ?Secondary Verification Process Completed: Yes ?Signs or symptoms of abuse/neglect since No ?Patient Requires Transmission-Based No last visito ?Precautions: ?Hospitalized since last visit: No ?Patient Has Alerts: Yes ?Implantable device outside of the clinic No ?Patient Alerts: Patient on Blood Thinner ?excluding ?PICC R Arm cellular tissue based products placed in the ?ABI 12/17/20 R=1.08 center ?L=1.13 since last visit: ?Has Dressing in Place as Prescribed: Yes ?Has Footwear/Offloading in Place as Yes ?Prescribed: ?Right: Surgical Shoe with Pressure Relief Insole ?Pain Present Now: No ?Electronic Signature(s) ?Signed: 04/16/2021 4:45:02 PM By: Antonieta Iba ?Entered By: Antonieta Iba on 04/16/2021 12:56:59 ?-------------------------------------------------------------------------------- ?Encounter Discharge Information Details ?Patient Name: Date of Service: ?[name] V IS, [name] 04/16/2021 12:30 PM ?Medical Record Number: [id number] ?Patient Account Number: [id number] ?Date of Birth/Sex: Treating RN: ?02-18-1980 (41 y.o. [name] ?Primary Care Rosmary Dionisio: Gwinda Passe Other Clinician: ?Referring Paizleigh Wilds: ?Treating  Ronte Parker/Extender: Geralyn Corwin ?Gwinda Passe ?Weeks in Treatment: 12 ?Encounter Discharge Information Items Post Procedure Vitals ?Discharge Condition: Stable ?Temperature (F): 98.5 ?Ambulatory Status: Wheelchair ?Pulse (bpm): 80 ?Discharge Destination: Home ?Respiratory Rate (breaths/min): 18 ?Transportation: Private Auto ?Blood Pressure (mmHg): 117/80 ?Schedule Follow-up Appointment: Yes ?Clinical Summary of Care: Provided on 04/16/2021 ?Form Type Recipient ?Paper Patient Patient ?Electronic Signature(s) ?Signed: 04/16/2021 4:45:02 PM By: Antonieta Iba ?Entered By: Antonieta Iba on 04/16/2021 13:13:48 ?-------------------------------------------------------------------------------- ?Lower Extremity Assessment Details ?Patient Name: ?Date of Service: ?[name] V IS, [name] 04/16/2021 12:30 PM ?Medical Record Number: [id number] ?Patient Account Number: [id number] ?Date of Birth/Sex: ?Treating RN: ?[birth year] (41 y.o. [name] ?Primary Care Sequita Wise: Gwinda Passe ?Other Clinician: ?Referring Delona Clasby: ?Treating Reymundo Winship/Extender: Geralyn Corwin ?Gwinda Passe ?Weeks in Treatment: 12 ?Edema Assessment ?Assessed: [Left: No] [Right: Yes] ?Edema: [Left: N] [Right: o] ?Calf ?Left: Right: ?Point of Measurement: 34 cm From Medial Instep 45 cm ?Ankle ?Left: Right: ?Point of Measurement: 8 cm From Medial Instep 25 cm ?Vascular Assessment ?Pulses: ?Dorsalis Pedis ?Palpable: [Right:Yes] ?Electronic Signature(s) ?Signed: 04/16/2021 4:45:02 PM By: Antonieta Iba ?Entered By: Antonieta Iba on 04/16/2021 12:57:43 ?-------------------------------------------------------------------------------- ?Multi Wound Chart Details ?Patient Name: ?Date of Service: ?[name] V IS, [name] 04/16/2021 12:30 PM ?Medical Record Number: [id number] ?Patient Account Number: [id number] ?Date of Birth/Sex: ?Treating RN: ?18-Oct-1980 (41 y.o. [name] ?Primary Care Onur Mori: Gwinda Passe ?Other Clinician: ?Referring  Kayler Rise: ?Treating Arzell Mcgeehan/Extender: Geralyn Corwin ?Gwinda Passe ?Weeks in Treatment: 12 ?Vital Signs ?Height(in): 69 ?Capillary Blood Glucose(mg/dl): 259 ?Weight(lbs): ?Pulse(bpm): 80 ?Body Mass Index(BMI): ?Blood Pressure(mmHg): 117/80 ?Temperature(??F): 98.5 ?Respiratory Rate(breaths/min): 18 ?Photos: [1:Right Amputation Site -] [N/A:N/A N/A] ?Wound Location: [1:Transmetatarsal Surgical Injury] [N/A:N/A] ?Wounding Event: [1:Diabetic Wound/Ulcer of the Lower] [N/A:N/A] ?Primary Etiology: [1:Extremity Hypertension, Type II Diabetes,] [N/A:N/A] ?Comorbid History: [1:Osteomyelitis, Neuropathy 12/15/2020] [N/A:N/A] ?Date Acquired: [1:12] [N/A:N/A] ?Weeks of Treatment: [1:Open] [N/A:N/A] ?Wound Status: [1:No] [N/A:N/A] ?Wound Recurrence: [1:1.5x5.8x1.8] [N/A:N/A] ?Measurements L x W x D (cm) [1:6.833] [N/A:N/A] ?A (cm?) : ?  rea [1:12.299] [N/A:N/A] ?Volume (cm?) : [1:83.90%] [N/A:N/A] ?% Reduction in A [1:rea: 81.90%] [N/A:N/A] ?% Reduction in Volume: [1:Grade 3] [N/A:N/A] ?Classification: [1:Medium] [N/A:N/A] ?Exudate A mount: [1:Serosanguineous] [N/A:N/A] ?Exudate Type: [1:red, brown] [N/A:N/A] ?Exudate Color: [1:Thickened] [N/A:N/A] ?Wound Margin: [1:Large (67-100%)] [N/A:N/A] ?Granulation A mount: [1:Red, Pink] [N/A:N/A] ?Granulation Quality: [1:Small (1-33%)] [N/A:N/A] ?Necrotic A mount: ?[1:Fat Layer (Subcutaneous Tissue): Yes N/A] ?Exposed Structures: ?[1:Fascia: No Tendon: No Muscle: No Joint: No Bone: No None] [N/A:N/A] ?Epithelialization: [1:Debridement - Excisional] [N/A:N/A] ?Debridement: ?Pre-procedure Verification/Time Out 13:02 [N/A:N/A] ?Taken: [1:Other] [N/A:N/A] ?Pain Control: [1:Subcutaneous, Slough] [N/A:N/A] ?Tissue Debrided: [1:Skin/Subcutaneous Tissue] [N/A:N/A] ?Level: [1:8.7] [N/A:N/A] ?Debridement A (sq cm): [1:rea Curette] [N/A:N/A] ?Instrument: [1:Minimum] [N/A:N/A] ?Bleeding: [1:Pressure] [N/A:N/A] ?Hemostasis A chieved: [1:Procedure was tolerated well] [N/A:N/A] ?Debridement  Treatment Response: [1:1.5x5.8x1.8] [N/A:N/A] ?Post Debridement Measurements L x ?W x D (cm) [1:12.299] [N/A:N/A] ?Post Debridement Volume: (cm?) [1:Debridement] [N/A:N/A] ?Treatment Notes ?Wound #1 (Amputation Site - Transmetatarsal) Wound Laterality: Right ?Cleanser ?Normal Saline ?Discharge Instruction: Cleanse the wound with Normal Saline prior to applying a clean dressing using gauze sponges, not tissue or cotton balls. ?Peri-Wound Care ?Topical ?Primary Dressing ?Hydrofera Blue Ready Foam, 4x5 in ?Discharge Instruction: Apply to wound bed as instructed ?Iodoform packing strip 1/4 (in) ?Discharge Instruction: Lightly pack into tunnel ?Secondary Dressing ?Woven Gauze Sponge, Non-Sterile 4x4 in ?Discharge Instruction: Apply over primary dressing as directed. ?ABD Pad, 5x9 ?Discharge Instruction: Apply over primary dressing as directed. ?Secured With ?Elastic Bandage 4 inch (ACE bandage) ?Discharge Instruction: Secure with ACE bandage as directed. ?Kerlix Roll Sterile, 4.5x3.1 (in/yd) ?Discharge Instruction: Secure with Kerlix as directed. ?56M Medipore H Soft Cloth Surgical T ape, 4 x 10 (in/yd) ?Discharge Instruction: Secure with tape as directed. ?Compression Wrap ?Compression Stockings ?Add-Ons ?Electronic Signature(s) ?Signed: 04/16/2021 1:41:40 PM By: Geralyn Corwin DO ?Signed: 04/16/2021 4:45:02 PM By: Antonieta Iba ?Entered By: Geralyn Corwin on 04/16/2021 13:31:58 ?-------------------------------------------------------------------------------- ?Multi-Disciplinary Care Plan Details ?Patient Name: ?Date of Service: ?[name] V IS, [name] 04/16/2021 12:30 PM ?Medical Record Number: [id number] ?Patient Account Number: [id number] ?Date of Birth/Sex: ?Treating RN: ?Aug 17, 1980 (41 y.o. [name] ?Primary Care Dinesha Twiggs: Gwinda Passe ?Other Clinician: ?Referring Mihaela Fajardo: ?Treating Alnita Aybar/Extender: Geralyn Corwin ?Gwinda Passe ?Weeks in Treatment: 12 ?Active Inactive ?Nutrition ?Nursing  Diagnoses: ?Impaired glucose control: actual or potential ?Goals: ?Patient/caregiver verbalizes understanding of need to maintain therapeutic glucose control per primary care physician ?Date Initiated: 01/22/2021 ?Target Resolution

## 2021-04-17 ENCOUNTER — Ambulatory Visit (INDEPENDENT_AMBULATORY_CARE_PROVIDER_SITE_OTHER): Payer: Self-pay | Admitting: Primary Care

## 2021-04-17 ENCOUNTER — Other Ambulatory Visit: Payer: Self-pay

## 2021-04-19 ENCOUNTER — Ambulatory Visit: Payer: Self-pay | Admitting: Pharmacist

## 2021-04-20 ENCOUNTER — Ambulatory Visit: Payer: Self-pay | Admitting: Pharmacist

## 2021-04-24 ENCOUNTER — Other Ambulatory Visit: Payer: Self-pay

## 2021-04-24 ENCOUNTER — Encounter: Payer: Self-pay | Admitting: Infectious Diseases

## 2021-04-24 ENCOUNTER — Other Ambulatory Visit: Payer: Self-pay | Admitting: Family Medicine

## 2021-04-24 ENCOUNTER — Ambulatory Visit (INDEPENDENT_AMBULATORY_CARE_PROVIDER_SITE_OTHER): Payer: Medicaid Other | Admitting: Infectious Diseases

## 2021-04-24 VITALS — BP 120/81 | HR 88 | Temp 97.6°F | Wt 305.0 lb

## 2021-04-24 DIAGNOSIS — Z5181 Encounter for therapeutic drug level monitoring: Secondary | ICD-10-CM | POA: Diagnosis not present

## 2021-04-24 DIAGNOSIS — M869 Osteomyelitis, unspecified: Secondary | ICD-10-CM

## 2021-04-24 DIAGNOSIS — E1165 Type 2 diabetes mellitus with hyperglycemia: Secondary | ICD-10-CM

## 2021-04-24 MED ORDER — GABAPENTIN 300 MG PO CAPS
600.0000 mg | ORAL_CAPSULE | Freq: Three times a day (TID) | ORAL | 0 refills | Status: DC
  Filled 2021-04-24: qty 180, 30d supply, fill #0

## 2021-04-24 NOTE — Progress Notes (Signed)
? ?   ? ?Patient Active Problem List  ? Diagnosis Date Noted  ? Osteomyelitis of right foot ([place]) 03/27/2021  ? PICC (peripherally inserted central catheter) in place 03/27/2021  ? Hyponatremia 03/09/2021  ? HTN (hypertension) 03/08/2021  ? Deep vein thrombosis (DVT) of right lower extremity ([place]) 03/08/2021  ? Wound dehiscence, surgical 02/02/2021  ? Obesity 02/02/2021  ? Wound dehiscence 01/24/2021  ? Medication monitoring encounter 01/02/2021  ? Diabetic ulcer of right foot associated with diabetes mellitus due to underlying condition ([place]) 01/02/2021  ? Right foot infection   ? Osteomyelitis of second toe of right foot ([place]) 12/16/2020  ? Type 2 diabetes mellitus with hyperglycemia ([place]) 12/16/2020  ? Class 3 obesity ([place]) 12/16/2020  ? Hyperlipidemia 12/16/2020  ? Soft tissue mass 09/15/2015  ? ?Current Outpatient Medications on File Prior to Visit  ?Medication Sig Dispense Refill  ? acetaminophen (TYLENOL) 325 MG tablet Take 2 tablets (650 mg total) by mouth every 6 (six) hours as needed for mild pain (or Fever >/= 101).    ? apixaban (ELIQUIS) 5 MG TABS tablet Take  one-77m tablet twice daily. 180 tablet 1  ? Blood Glucose Monitoring Suppl (TRUE METRIX METER) w/Device KIT Use to check blood sugar twice a day. 1 kit 0  ? cefadroxil (DURICEF) 500 MG capsule Take 2 capsules (1,000 mg total) by mouth 2 (two) times daily. 84 capsule 0  ? Dulaglutide (TRULICITY) 07.86MVE/7.2CNSOPN Inject 0.75 mg into the skin once a week. 2 mL 1  ? gabapentin (NEURONTIN) 300 MG capsule Take 2 capsules by mouth 3 times daily. 180 capsule 0  ? glucose blood (TRUE METRIX BLOOD GLUCOSE TEST) test strip Use to check blood sugar twice a day. 100 each 2  ? Insulin Glargine (BASAGLAR KWIKPEN) 100 UNIT/ML Inject 60 Units into the skin daily. 15 mL 2  ? Insulin Pen Needle 32G X 4 MM MISC use as directed 100 each 2  ? lisinopril (ZESTRIL) 2.5 MG tablet Take 1 tablet by mouth daily 90 tablet 0  ? metFORMIN (GLUCOPHAGE-XR) 500 MG 24 hr tablet  Take 2 tablets (1,000 mg total) by mouth 2 (two) times daily. 120 tablet 2  ? metroNIDAZOLE (FLAGYL) 500 MG tablet Take 1 tablet (500 mg total) by mouth 2 (two) times daily. 42 tablet 0  ? ondansetron (ZOFRAN) 4 MG tablet Take 1 tablet (4 mg total) by mouth every 6 (six) hours as needed for nausea. 20 tablet 0  ? oxyCODONE (OXY IR/ROXICODONE) 5 MG immediate release tablet Take 5 mg by mouth.    ? TRUEplus Lancets 28G MISC Use to check blood sugar twice a day. 100 each 2  ? ?No current facility-administered medications on file prior to visit.  ? ? ?Subjective: ?Here for HFU for rt TMA site dehiscence with remaining osteomyelitis. Taking cefadroxil and metronidazole, missed 1 or 2 days of abt otherwise compliant. She is following up with wound care closely and wound seems to be looking good. Denies fevers, chills and sweats. Denies N//D. Denies any pain/tenderness and swelling or discharge from the rt tma site. Hada blue spot at the center which she thinks is likely due to a bluish solution which is being used for wound care. She is also happy about her a1c coming down to 8. She is also organising a community event for DM awareness.  ? ?Review of Systems: ?ROS all systems reviewed and negative except as above  ? ?Past Medical History:  ?Diagnosis Date  ? Class 3  obesity ([place]) 12/16/2020  ? Diabetes mellitus   ? ?Past Surgical History:  ?Procedure Laterality Date  ? EXTREMITY CYST EXCISION    ? MINOR IRRIGATION AND DEBRIDEMENT OF WOUND Right 03/10/2021  ? Procedure: MINOR IRRIGATION AND DEBRIDEMENT OF WOUND;  Surgeon: Armond Hang, MD;  Location: [facility];  Service: Orthopedics;  Laterality: Right;  ? 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 Procedure: TRANSMETATARSAL AMPUTATION;  Surgeon: Armond Hang, MD;  Location: [facility];  Service: Orthopedics;  Laterality: Right;  ? ? ?Social History  ? ?Tobacco Use  ? Smoking status: Former  ?  Types: Cigarettes  ?Vaping Use  ? Vaping Use: Never used  ?Substance Use  Topics  ? Alcohol use: Not Currently  ?  Comment: occ  ? Drug use: Yes  ?  Types: Marijuana  ?  Comment: occasionally  ? ? ?Family History  ?Problem Relation Age of Onset  ? Hypertension Mother   ? Diabetes Mother   ? Diabetes Other   ? ? ?No Known Allergies ? ?Health Maintenance  ?Topic Date Due  ? COVID-19 Vaccine (1) Never done  ? FOOT EXAM  Never done  ? OPHTHALMOLOGY EXAM  Never done  ? Hepatitis C Screening  Never done  ? PAP SMEAR-Modifier  Never done  ? TETANUS/TDAP  01/17/2022 (Originally 06/06/2000)  ? INFLUENZA VACCINE  08/14/2021  ? HEMOGLOBIN A1C  09/26/2021  ? HIV Screening  Completed  ? HPV VACCINES  Aged Out  ? ? ?Objective: ?BP 120/81   Pulse 88   Temp 97.6 ?F (36.4 ?C) (Oral)   SpO2 100%  ? ? ? ?Physical Exam ?Constitutional:   ?   Appearance: Normal appearance.  ?HENT:  ?   Head: Normocephalic and atraumatic.   ?   Mouth: Mucous membranes are moist.  ?Eyes: ?   Conjunctiva/sclera: Conjunctivae normal.  ?   Pupils: Pupils are equal, round ? ?Cardiovascular:  ?   Rate and Rhythm: Normal rate and regular rhythm.  ?   Heart sounds:  ? ?Pulmonary:  ?   Effort: Pulmonary effort is normal.  ?   Breath sounds: Normal breath sounds.  ? ?Abdominal:  ?   General: Non distended  ?   Palpations: soft.  ? ?Musculoskeletal:     ?   General: Normal range of motion.  ? ?RT TMA site ? ? ? ?Skin: ?   General: Skin is warm and dry.  ?   Comments: ? ?Neurological:  ?   General: grossly non focal  ?   Mental Status: awake, alert and oriented to person, place, and time.  ? ?Psychiatric:     ?   Mood and Affect: Mood normal.  ? ?Pathology  ?FINAL MICROSCOPIC DIAGNOSIS:  ? ?A. RIGHT FOOT TISSUE, TRANSMETATARSAL AMPUTATION REVISION:  ?- Skin with ulceration and associated necrotizing inflammation  ? ?Microbiology ?Results for orders placed or performed during the hospital encounter of 03/08/21  ?Blood culture (routine x 2)     Status: None  ? Collection Time: 03/08/21 12:23 PM  ? Specimen: BLOOD  ?Result Value Ref Range  Status  ? Specimen Description   Final  ?  BLOOD BLOOD RIGHT FOREARM ?Performed at [facility], [place] [address] [place], [place] [address] ?  ? Special Requests   Final  ?  BOTTLES DRAWN AEROBIC AND ANAEROBIC Blood Culture results may not be optimal due to an inadequate volume of blood received in culture bottles ?Performed at [facility], [address]  [place], [place] [address] ?  ? Culture   Final  ?  NO GROWTH 5 DAYS ?Performed at [facility], [place] [address], [place], [place] [address] ?  ? Report Status 03/13/2021 FINAL  Final  ?Resp Panel by RT-PCR (Flu A&B, Covid) Peripheral     Status: None  ? Collection Time: 03/08/21  3:11 PM  ? Specimen: Peripheral; Nasopharyngeal(NP) swabs in vial transport medium  ?Result Value Ref Range Status  ? SARS Coronavirus 2 by RT PCR NEGATIVE NEGATIVE Final  ?  Comment: (NOTE) ?SARS-CoV-2 target nucleic acids are NOT DETECTED. ? ?The SARS-CoV-2 RNA is generally detectable in upper respiratory ?specimens during the acute phase of infection. The lowest ?concentration of SARS-CoV-2 viral copies this assay can detect is ?138 copies/mL. A negative result does not preclude SARS-Cov-2 ?infection and should not be used as the sole basis for treatment or ?other patient management decisions. A negative result may occur with  ?improper specimen collection/handling, submission of specimen other ?than nasopharyngeal swab, presence of viral mutation(s) within the ?areas targeted by this assay, and inadequate number of viral ?copies(<138 copies/mL). A negative result must be combined with ?clinical observations, patient history, and epidemiological ?information. The expected result is Negative. ? ?Fact Sheet for Patients:  ?EntrepreneurPulse.com.au ? ?Fact Sheet for Healthcare Providers:  ?IncredibleEmployment.be ? ?This test is no t yet approved or cleared by the [place] FDA and  ?has been authorized  for detection and/or diagnosis of SARS-CoV-2 by ?FDA under an Emergency Use Authorization (EUA). This EUA will remain  ?in effect (meaning this test can be used) for the duration of the ?COVID-19 declarati

## 2021-04-24 NOTE — Telephone Encounter (Signed)
Requested Prescriptions  ?Pending Prescriptions Disp Refills  ?? gabapentin (NEURONTIN) 300 MG capsule 180 capsule 0  ?  Sig: Take 2 capsules by mouth 3 times daily.  ?  ? Neurology: Anticonvulsants - gabapentin Passed - 04/24/2021  2:55 AM  ?  ?  Passed - Cr in normal range and within 360 days  ?  Creat  ?Date Value Ref Range Status  ?02/15/2021 0.71 0.50 - 0.97 mg/dL Final  ? ?Creatinine, Ser  ?Date Value Ref Range Status  ?03/13/2021 0.68 0.44 - 1.00 mg/dL Final  ?   ?  ?  Passed - Completed PHQ-2 or PHQ-9 in the last 360 days  ?  ?  Passed - Valid encounter within last 12 months  ?  Recent Outpatient Visits   ?      ? 4 weeks ago Type 2 diabetes mellitus with hyperglycemia, unspecified whether long term insulin use (HCC)  ? [facility] Grayce Sessions, NP  ? 2 months ago Type 2 diabetes mellitus with hyperglycemia, with long-term current use of insulin (HCC)  ? [facility] Lois Huxley, Cornelius Moras, RPH-CPP  ? 2 months ago Hospital discharge follow-up  ? [facility] Grayce Sessions, NP  ? 3 months ago Type 2 diabetes mellitus with hyperglycemia, unspecified whether long term insulin use (HCC)  ? [facility] Grayce Sessions, NP  ?  ?  ?Future Appointments   ?        ? In 1 week Lois Huxley, Cornelius Moras, RPH-CPP [facility]  ? In 2 months Randa Evens, Kinnie Scales, NP [facility]  ?  ? ?  ?  ?  ? ? ?

## 2021-04-29 ENCOUNTER — Other Ambulatory Visit (INDEPENDENT_AMBULATORY_CARE_PROVIDER_SITE_OTHER): Payer: Self-pay | Admitting: Primary Care

## 2021-04-30 ENCOUNTER — Encounter (HOSPITAL_BASED_OUTPATIENT_CLINIC_OR_DEPARTMENT_OTHER): Payer: Medicaid Other | Admitting: Internal Medicine

## 2021-04-30 ENCOUNTER — Other Ambulatory Visit: Payer: Self-pay

## 2021-04-30 MED ORDER — LISINOPRIL 2.5 MG PO TABS
ORAL_TABLET | ORAL | 0 refills | Status: DC
  Filled 2021-04-30: qty 30, 30d supply, fill #0

## 2021-05-01 ENCOUNTER — Encounter (HOSPITAL_BASED_OUTPATIENT_CLINIC_OR_DEPARTMENT_OTHER): Payer: Medicaid Other | Admitting: Internal Medicine

## 2021-05-01 DIAGNOSIS — E11621 Type 2 diabetes mellitus with foot ulcer: Secondary | ICD-10-CM | POA: Diagnosis not present

## 2021-05-01 DIAGNOSIS — L97514 Non-pressure chronic ulcer of other part of right foot with necrosis of bone: Secondary | ICD-10-CM | POA: Diagnosis not present

## 2021-05-01 NOTE — Progress Notes (Signed)
[name], [name] ([id number]) ?Visit Report for 05/01/2021 ?Arrival Information Details ?Patient Name: Date of Service: ?[name], [name] 05/01/2021 12:30 PM ?Medical Record Number: [id number] ?Patient Account Number: [id number] ?Date of Birth/Sex: Treating RN: ?[birth year] (41 y.o. [name] ?Primary Care [name]: [name] Other Clinician: ?Referring Tymeshia Awan: ?Treating Luciel Brickman/Extender: [name] ?[name] ?Weeks in Treatment: 14 ?Visit Information History Since Last Visit ?Added or deleted any medications: No ?Patient Arrived: Wheel Chair ?Any new allergies or adverse reactions: No ?Arrival Time: 12:38 ?Had a fall or experienced change in No ?Transfer Assistance: None ?activities of daily living that may affect ?Patient Identification Verified: Yes ?risk of falls: ?Secondary Verification Process Completed: Yes ?Signs or symptoms of abuse/neglect since last visito No ?Patient Requires Transmission-Based No ?Hospitalized since last visit: No ?Precautions: ?Implantable device outside of the clinic excluding No ?Patient Has Alerts: Yes ?cellular tissue based products placed in the center ?Patient Alerts: Patient on Blood Thinner ?since last visit: ?PICC R Arm ?Has Dressing in Place as Prescribed: Yes ?ABI 12/17/20 R=1.08 ?Pain Present Now: No ?L=1.13 ?Electronic Signature(s) ?Signed: 05/01/2021 5:04:09 PM By: [name] ?Entered By: [name] on 05/01/2021 12:39:03 ?-------------------------------------------------------------------------------- ?Encounter Discharge Information Details ?Patient Name: Date of Service: ?[name], [name] 05/01/2021 12:30 PM ?Medical Record Number: [id number] ?Patient Account Number: [id number] ?Date of Birth/Sex: Treating RN: ?[birth year] (41 y.o. [name] ?Primary Care Shakeya Kerkman: [name] Other Clinician: ?Referring [name]: ?Treating [name]/Extender: [name] ?[name] ?Weeks in Treatment: 14 ?Encounter Discharge  Information Items Post Procedure Vitals ?Discharge Condition: Stable ?Temperature (F): 98.1 ?Ambulatory Status: Wheelchair ?Pulse (bpm): 76 ?Discharge Destination: Home ?Respiratory Rate (breaths/min): 18 ?Transportation: Private Auto ?Blood Pressure (mmHg): 115/78 ?Schedule Follow-up Appointment: Yes ?Clinical Summary of Care: Provided on 05/01/2021 ?Form Type Recipient ?Paper Patient Patient ?Electronic Signature(s) ?Signed: 05/01/2021 5:04:09 PM By: [name] ?Entered By: [name] on 05/01/2021 13:35:34 ?-------------------------------------------------------------------------------- ?Lower Extremity Assessment Details ?Patient Name: ?Date of Service: ?[name], [name] 05/01/2021 12:30 PM ?Medical Record Number: [id number] ?Patient Account Number: [id number] ?Date of Birth/Sex: ?Treating RN: ?10-29-1980 (41 y.o. [name] ?Primary Care Galya Dunnigan: [name] ?Other Clinician: ?Referring Jamita Mckelvin: ?Treating Evian Derringer/Extender: [name] ?[name] ?Weeks in Treatment: 14 ?Edema Assessment ?Assessed: [Left: No] [Right: Yes] ?Edema: [Left: N] [Right: o] ?Calf ?Left: Right: ?Point of Measurement: 34 cm From Medial Instep 45 cm ?Ankle ?Left: Right: ?Point of Measurement: 8 cm From Medial Instep 25 cm ?Vascular Assessment ?Pulses: ?Dorsalis Pedis ?Palpable: [Right:Yes] ?Electronic Signature(s) ?Signed: 05/01/2021 5:04:09 PM By: [name] ?Entered By: [name] on 05/01/2021 12:44:21 ?-------------------------------------------------------------------------------- ?Multi Wound Chart Details ?Patient Name: ?Date of Service: ?[name], [name] 05/01/2021 12:30 PM ?Medical Record Number: [id number] ?Patient Account Number: [id number] ?Date of Birth/Sex: ?Treating RN: ?[birth year] (41 y.o. F) ?Primary Care Jenie Parish: [name] ?Other Clinician: ?Referring Cameryn Chrisley: ?Treating Decarlos Empey/Extender: [name] ?[name] ?Weeks in Treatment: 14 ?Vital  Signs ?Height(in): 69 ?Pulse(bpm): 76 ?Weight(lbs): ?Blood Pressure(mmHg): 115/78 ?Body Mass Index(BMI): ?Temperature(??F): 98.1 ?Respiratory Rate(breaths/min): 18 ?Photos: [1:Right Amputation Site -] [N/A:N/A N/A] ?Wound Location: [1:Transmetatarsal Surgical Injury] [N/A:N/A] ?Wounding Event: [1:Diabetic Wound/Ulcer of the Lower] [N/A:N/A] ?Primary Etiology: [1:Extremity Hypertension, Type II Diabetes,] [N/A:N/A] ?Comorbid History: [1:Osteomyelitis, Neuropathy 12/15/2020] [N/A:N/A] ?Date Acquired: [1:14] [N/A:N/A] ?Weeks of Treatment: [1:Open] [N/A:N/A] ?Wound Status: [1:No] [N/A:N/A] ?Wound Recurrence: [1:1.5x5.6x0.6] [N/A:N/A] ?Measurements L x W x D (cm) [1:6.597] [N/A:N/A] ?A (cm?) : ?rea [1:3.958] [N/A:N/A] ?Volume (cm?) : [1:84.50%] [N/A:N/A] ?% Reduction in A [1:rea: 94.20%] [N/A:N/A] ?% Reduction in Volume: [  1:Grade 3] [N/A:N/A] ?Classification: [1:Medium] [N/A:N/A] ?Exudate A mount: [1:Serosanguineous] [N/A:N/A] ?Exudate Type: [1:red, brown] [N/A:N/A] ?Exudate Color: [1:Thickened] [N/A:N/A] ?Wound Margin: [1:Medium (34-66%)] [N/A:N/A] ?Granulation A mount: [1:Red, Pink] [N/A:N/A] ?Granulation Quality: [1:Medium (34-66%)] [N/A:N/A] ?Necrotic A mount: ?[1:Fat Layer (Subcutaneous Tissue): Yes N/A] ?Exposed Structures: ?[1:Fascia: No Tendon: No Muscle: No Joint: No Bone: No None] [N/A:N/A] ?Epithelialization: [1:Debridement - Excisional] [N/A:N/A] ?Debridement: ?Pre-procedure Verification/Time Out 13:17 [N/A:N/A] ?Taken: [1:Other] [N/A:N/A] ?Pain Control: [1:Callus, Subcutaneous, Slough] [N/A:N/A] ?Tissue Debrided: [1:Skin/Subcutaneous Tissue] [N/A:N/A] ?Level: [1:6] [N/A:N/A] ?Debridement A (sq cm): [1:rea Curette] [N/A:N/A] ?Instrument: [1:Minimum] [N/A:N/A] ?Bleeding: [1:Pressure] [N/A:N/A] ?Hemostasis A chieved: [1:Procedure was tolerated well] [N/A:N/A] ?Debridement Treatment Response: [1:1.5x5.6x0.6] [N/A:N/A] ?Post Debridement Measurements L x ?W x D (cm) [1:3.958] [N/A:N/A] ?Post Debridement Volume:  (cm?) [1:Debridement] [N/A:N/A] ?Treatment Notes ?Wound #1 (Amputation Site - Transmetatarsal) Wound Laterality: Right ?Cleanser ?Normal Saline ?Discharge Instruction: Cleanse the wound with Normal Saline prior to applying a clean dressing using gauze sponges, not tissue or cotton balls. ?Peri-Wound Care ?Topical ?Primary Dressing ?Hydrofera Blue Ready Foam, 4x5 in ?Discharge Instruction: Apply to wound bed as instructed ?Secondary Dressing ?ABD Pad, 5x9 ?Discharge Instruction: Apply over primary dressing as directed. ?Woven Gauze Sponge, Non-Sterile 4x4 in ?Discharge Instruction: Apply over primary dressing as directed. ?Secured With ?Elastic Bandage 4 inch (ACE bandage) ?Discharge Instruction: Secure with ACE bandage as directed. ?Kerlix Roll Sterile, 4.5x3.1 (in/yd) ?Discharge Instruction: Secure with Kerlix as directed. ?55M Medipore H Soft Cloth Surgical T ape, 4 x 10 (in/yd) ?Discharge Instruction: Secure with tape as directed. ?Compression Wrap ?Compression Stockings ?Add-Ons ?Electronic Signature(s) ?Signed: 05/01/2021 1:49:49 PM By: [name] Corwin DO ?Entered By: [name] on 05/01/2021 13:41:01 ?-------------------------------------------------------------------------------- ?Multi-Disciplinary Care Plan Details ?Patient Name: ?Date of Service: ?[name], [name] 05/01/2021 12:30 PM ?Medical Record Number: [id number] ?Patient Account Number: [id number] ?Date of Birth/Sex: ?Treating RN: ?[birth year] (41 y.o. [name] ?Primary Care Nishaan Stanke: [name] ?Other Clinician: ?Referring Marieelena Bartko: ?Treating Jahyra Sukup/Extender: [name] ?[name] ?Weeks in Treatment: 14 ?Active Inactive ?Nutrition ?Nursing Diagnoses: ?Impaired glucose control: actual or potential ?Goals: ?Patient/caregiver verbalizes understanding of need to maintain therapeutic glucose control per primary care physician ?Date Initiated: 01/22/2021 ?Target Resolution Date: 06/04/2021 ?Goal Status:  Active ?Interventions: ?Assess HgA1c results as ordered upon admission and as needed ?Provide education on elevated blood sugars and impact on wound healing ?Treatment Activities: ?Obtain HgA1c : 01/22/2021 ?Notes: ?Wound/Skin Impairment ?N

## 2021-05-01 NOTE — Progress Notes (Signed)
Keeler, Avilene L. ([id number]) ?Visit Report for 05/01/2021 ?Chief Complaint Document Details ?Patient Name: Date of Service: ?[name], [name] 05/01/2021 12:30 PM ?Medical Record Number: [id number] ?Patient Account Number: [id number] ?Date of Birth/Sex: Treating RN: ?04/03/1980 (41 y.o. F) ?Primary Care Provider: Gwinda Passe Other Clinician: ?Referring Provider: ?Treating Provider/Extender: Geralyn Corwin ?Gwinda Passe ?Weeks in Treatment: 14 ?Information Obtained from: Patient ?Chief Complaint ?Osteomyelitis of the right foot status post transmetatarsal amputation with surgical site dehiscence ?Electronic Signature(s) ?Signed: 05/01/2021 1:49:49 PM By: Geralyn Corwin DO ?Entered By: Geralyn Corwin on 05/01/2021 13:41:14 ?-------------------------------------------------------------------------------- ?Debridement Details ?Patient Name: Date of Service: ?[name], [name] 05/01/2021 12:30 PM ?Medical Record Number: [id number] ?Patient Account Number: [id number] ?Date of Birth/Sex: Treating RN: ?04-10-1980 (41 y.o. [name] ?Primary Care Provider: Gwinda Passe Other Clinician: ?Referring Provider: ?Treating Provider/Extender: Geralyn Corwin ?Gwinda Passe ?Weeks in Treatment: 14 ?Debridement Performed for Assessment: Wound #1 Right Amputation Site - Transmetatarsal ?Performed By: Physician Geralyn Corwin, DO ?Debridement Type: Debridement ?Severity of Tissue Pre Debridement: Fat layer exposed ?Level of Consciousness (Pre-procedure): Awake and Alert ?Pre-procedure Verification/Time Out Yes - 13:17 ?Taken: ?Start Time: 13:18 ?Pain Control: ?Other : Benzocaine ?T Area Debrided (L x W): ?otal 1.5 (cm) x 4 (cm) = 6 (cm?) ?Tissue and other material debrided: Non-Viable, Callus, Slough, Subcutaneous, Slough ?Level: Skin/Subcutaneous Tissue ?Debridement Description: Excisional ?Instrument: Curette ?Bleeding: Minimum ?Hemostasis Achieved: Pressure ?End Time: 13:24 ?Response to Treatment:  Procedure was tolerated well ?Level of Consciousness (Post- Awake and Alert ?procedure): ?Post Debridement Measurements of Total Wound ?Length: (cm) 1.5 ?Width: (cm) 5.6 ?Depth: (cm) 0.6 ?Volume: (cm?) 3.958 ?Character of Wound/Ulcer Post Debridement: Stable ?Severity of Tissue Post Debridement: Fat layer exposed ?Post Procedure Diagnosis ?Same as Pre-procedure ?Electronic Signature(s) ?Signed: 05/01/2021 1:49:49 PM By: Geralyn Corwin DO ?Signed: 05/01/2021 5:04:09 PM By: Antonieta Iba ?Entered By: Antonieta Iba on 05/01/2021 13:24:47 ?-------------------------------------------------------------------------------- ?HPI Details ?Patient Name: Date of Service: ?[name], [name] 05/01/2021 12:30 PM ?Medical Record Number: [id number] ?Patient Account Number: [id number] ?Date of Birth/Sex: Treating RN: ?Jul 20, 1980 (41 y.o. F) ?Primary Care Provider: Gwinda Passe Other Clinician: ?Referring Provider: ?Treating Provider/Extender: Geralyn Corwin ?Gwinda Passe ?Weeks in Treatment: 14 ?History of Present Illness ?HPI Description: Admission 01/22/2021 ?Ms. Latanza Pfefferkorn is a 41 year old female with a past medical history of insulin-dependent uncontrolled type 2 diabetes with last hemoglobin A1c of 13.5, ?osteomyelitis of the right foot status post transmetatarsal amputation on 12/18/2020 that presents to the clinic for right foot wound. She has had dehiscence of ?the surgical site. She is currently using wet-to-dry dressings. She has a PICC line and receiving IV ceftriaxone daily for her osteomyelitis. There is an end date ?of 01/27/2021. She is also taking oral metronidazole. She currently denies systemic signs of infection. ?1/19; patient presents for follow-up. She was diagnosed with a DVT to the right lower extremity 2 days ago. She is on Eliquis now. She is scheduled to see her ?infectious disease doctor tomorrow. She has been using Dakin's wet-to-dry dressings. She denies systemic signs of infection. ?1/26;  patient presents for follow-up. She saw infectious disease on 1/21 started on Augmentin. Her PICC line and IV ceftriaxone was discontinued. Patient ?reports stability to her wound. She has been using Dakin's wet-to-dry dressings. She currently denies systemic signs of infection. ?2/3; patient presents for follow-up. She continues to use Dakin's wet-to-dry dressings to the wound bed. She saw Dr. Manson Passey with infectious disease ?yesterday and is continuing Augmentin. T entative end date is 2/16. Patient reports  following up with orthopedics. She states there is no further plan from them. ?She currently denies systemic signs of infection. ?2/10; patient presents for follow-up. She continues to use Dakin's wet to dry dressings. She is scheduled to have her MRI done on 2/14. She states that she ?had pain to the debridement site from last clinic visit and declines debridement today. She denies systemic signs of infection. She continues to have yellow ?thick drainage. ?2/20; patient presents for follow-up. She continues to use Dakin's wet-to-dry dressings. She obtained her MRI. The results showed an abscess and she is ?scheduled to see her orthopedic surgeon on 2/23. She saw infectious disease 2/17 and her antibiotics were extended. She currently denies systemic signs of ?infection. ?3/6; patient presents for follow-up. She had debridement and irrigation of her foot on 03/10/2021 due to abscess noted on MRI. She was started on IV ?ceftriaxone and oral Flagyl. She has no issues or complaints today. She has been using iodoform packing to the tunnel and Dakin's wet-to-dry to the opening. ?03/26/2021: She continues on IV ceftriaxone and oral metronidazole. She has follow-up with infectious disease tomorrow. No significant issues or complaints ?today. Her mother continues to help her with her wound dressing, using iodoform packing strips into the tunnel and Dakin's to the open portion of the wound. ?3/20; patient presents for  follow-up. She continues to be on IV ceftriaxone in oral metronidazole. She has been using iodoform to the tunnel and Dakin's wet-to- ?dry to the open wound. She denies signs of infection. ?3/27; patient presents for follow-up. She no longer has a PICC line. She has been using iodoform to the tunnel and Dakin's wet-to-dry to the open wound. She ?reports improvement in wound healing. She denies signs of infection. ?4/3; patient presents for follow-up. She states she has been using Hydrofera Blue to the open wound and iodoform packing to the tunnel without any issues. ?She denies signs of infection. ?4/18; patient presents for follow-up. She saw infectious disease on 4/11. She has finished her oral antibiotics and completed a total of 6 weeks of antibiotics ?(this includes IV as well). No further antibiotics needed. She has been using Hydrofera Blue and iodoform packing. She states that the tunneled area has come ?in and the iodoform is not staying in place anymore. She has no issues or complaints today. She denies signs of infection. ?Electronic Signature(s) ?Signed: 05/01/2021 1:49:49 PM By: Geralyn Corwin DO ?Entered By: Geralyn Corwin on 05/01/2021 13:45:14 ?-------------------------------------------------------------------------------- ?Physical Exam Details ?Patient Name: Date of Service: ?[name], [name] 05/01/2021 12:30 PM ?Medical Record Number: [id number] ?Patient Account Number: [id number] ?Date of Birth/Sex: Treating RN: ?20-Nov-1980 (41 y.o. F) ?Primary Care Provider: Gwinda Passe Other Clinician: ?Referring Provider: ?Treating Provider/Extender: Geralyn Corwin ?Gwinda Passe ?Weeks in Treatment: 14 ?Constitutional ?respirations regular, non-labored and within target range for patient.Marland Kitchen ?Cardiovascular ?2+ dorsalis pedis/posterior tibialis pulses. ?Psychiatric ?pleasant and cooperative. ?Notes ?Right foot: Transmetatarsal amputation with wound dehiscence. At the surgical site there is  nonviable tissue and granulation tissue present. Epithelialization ?occurring. No signs of surrounding infection. ?Electronic Signature(s) ?Signed: 05/01/2021 1:49:49 PM By: Geralyn Corwin DO ?Entered By: Godfrey Pick

## 2021-05-04 ENCOUNTER — Encounter: Payer: Self-pay | Admitting: Plastic Surgery

## 2021-05-04 ENCOUNTER — Ambulatory Visit (INDEPENDENT_AMBULATORY_CARE_PROVIDER_SITE_OTHER): Payer: Medicaid Other | Admitting: Plastic Surgery

## 2021-05-04 DIAGNOSIS — E08621 Diabetes mellitus due to underlying condition with foot ulcer: Secondary | ICD-10-CM | POA: Diagnosis not present

## 2021-05-04 DIAGNOSIS — L97519 Non-pressure chronic ulcer of other part of right foot with unspecified severity: Secondary | ICD-10-CM

## 2021-05-04 DIAGNOSIS — T8131XS Disruption of external operation (surgical) wound, not elsewhere classified, sequela: Secondary | ICD-10-CM

## 2021-05-04 DIAGNOSIS — M869 Osteomyelitis, unspecified: Secondary | ICD-10-CM | POA: Diagnosis not present

## 2021-05-04 NOTE — Progress Notes (Signed)
? ?  S:    ?[name] is a 41 y.o. female who presents for diabetes evaluation, education, and management. PMH is significant for HTN, T2DM, HLD, osteomyelitis. Patient was referred and last seen by Primary Care Provider, Gwinda Passe, NP, on 03/26/21 when Trulicity was started.  ? ?Today, patient arrives in good spirits and presents with assistance of a wheelchair due to R transmetatarsal amputation. She is tolerating Trulicity well without any adverse effects. She is not currently checking her BG at home but does have a meter. Denies any symptoms of hypoglycemia.  ? ?Family/Social History:  ?-Former smoker ?-HTN and DM in mother ? ?Current diabetes medications include: Trulicity 0.75 mg weekly, Basaglar 60 units daily, metformin XR 1000 mg BID ?Current hypertension medications include: lisinopril 2.5 mg daily ? ?Patient reports taking all medications as prescribed. Patient reports adherence with medications. Patient reports missing some doses a few weeks ago but is back on track with taking her medications every day.  ? ?Do you feel that your medications are working for you? yes ?Have you been experiencing any side effects to the medications prescribed? no ?Do you have any problems obtaining medications due to transportation or finances? no ?Insurance coverage: self pay ? ?Patient denies hypoglycemic events. ? ?Reported home fasting blood sugars: not checking  ?Reported 2 hour post-meal/random blood sugars: not checking ? ?Patient reports nocturia (nighttime urination). 1-2x/night, improved from previous. ?Patient denies neuropathy (nerve pain). ?Patient denies visual changes. ?Patient reports self foot exams.  ? ?Patient reported dietary habits: 1-3 meals/day depending on the day ?Breakfast: grits, eggs, sausage ?Lunch: sandwich or salad ?Dinner: Public librarian, corn, chicken ? ?Within the past 12 months, did you worry whether your food would run out before you got money to buy more? no ?Within the past 12  months, did the food you bought run out, and you didn?t have money to get more? no ? ?Patient-reported exercise habits: limited by wheelchair/foot but walking some ? ?O:  ?7 day average blood glucose: not checking ? ?Lab Results  ?Component Value Date  ? HGBA1C 8.3 (A) 03/26/2021  ? ?There were no vitals filed for this visit. ? ?Lipid Panel  ?   ?Component Value Date/Time  ? CHOL 92 12/20/2020 0434  ? TRIG 103 12/20/2020 0434  ? HDL 13 (L) 12/20/2020 0434  ? CHOLHDL 7.1 12/20/2020 0434  ? VLDL 21 12/20/2020 0434  ? LDLCALC 58 12/20/2020 0434  ? ?Clinical Atherosclerotic Cardiovascular Disease (ASCVD): No  ?The ASCVD Risk score (Arnett DK, et al., 2019) failed to calculate for the following reasons: ?  The 2019 ASCVD risk score is only valid for ages 74 to 56  ? ?A/P: ?Diabetes longstanding currently uncontrolled with last A1c 8.3 on 03/26/21, down from 13.5 three months prior. Patient is able to verbalize appropriate hypoglycemia management plan. Medication adherence appears appropriate and patient appears motivated to improve BG.   ?-Increase Trulicity to 1.5 mg weekly ?-Start checking BG. If fasting BG consistently <130 mg/dL, decrease Basaglar by 2 units.  ?-Continue metformin XR 1000 mg BID ?-Extensively discussed pathophysiology of diabetes, recommended lifestyle interventions, dietary effects on blood sugar control.  ?-Counseled on s/sx of and management of hypoglycemia.  ?-Next A1c anticipated June 2023.  ? ?Written patient instructions provided. Patient verbalized understanding of treatment plan. Total time in face to face counseling 42 minutes.   ? ?Follow up at next PCP visit 06/26/21.   ? ?Pervis Hocking, PharmD ?PGY2 Ambulatory Care Pharmacy Resident ?05/07/2021 3:35 PM ?

## 2021-05-04 NOTE — Progress Notes (Signed)
? ?Referring Provider ?Kerin Perna, NP ?[id number] ?[place],  [place] [id number]  ? ?CC:  ?Right foot wound ? ? ?JOSELINNE [name] is an 41 y.o. female.  ?HPI: Patient is a 41 year old with a right foot wound.  She is status post transmetatarsal amputation.  She is currently being followed in the wound center by Dr. Heber West Ha[place]st[place].  Her orthopedic note mentions that she was to be sent to plastics for possible skin graft.  In talking with the patient it sounds like they were considering a wound VAC in the wound center. ? ?No Known Allergies ? ?Outpatient Encounter Medications as of 05/04/2021  ?Medication Sig  ? acetaminophen (TYLENOL) 325 MG tablet Take 2 tablets (650 mg total) by mouth every 6 (six) hours as needed for mild pain (or Fever >/= 101).  ? apixaban (ELIQUIS) 5 MG TABS tablet Take  one-58m tablet twice daily.  ? Blood Glucose Monitoring Suppl (TRUE METRIX METER) w/Device KIT Use to check blood sugar twice a day.  ? cefadroxil (DURICEF) 500 MG capsule Take 2 capsules (1,000 mg total) by mouth 2 (two) times daily.  ? Dulaglutide (TRULICITY) 00.62MBJ/6.2GBSOPN Inject 0.75 mg into the skin once a week.  ? gabapentin (NEURONTIN) 300 MG capsule Take 2 capsules by mouth 3 times daily.  ? glucose blood (TRUE METRIX BLOOD GLUCOSE TEST) test strip Use to check blood sugar twice a day.  ? Insulin Glargine (BASAGLAR KWIKPEN) 100 UNIT/ML Inject 60 Units into the skin daily.  ? Insulin Pen Needle 32G X 4 MM MISC use as directed  ? lisinopril (ZESTRIL) 2.5 MG tablet Take 1 tablet by mouth daily  ? metFORMIN (GLUCOPHAGE-XR) 500 MG 24 hr tablet Take 2 tablets (1,000 mg total) by mouth 2 (two) times daily.  ? metroNIDAZOLE (FLAGYL) 500 MG tablet Take 1 tablet (500 mg total) by mouth 2 (two) times daily.  ? TRUEplus Lancets 28G MISC Use to check blood sugar twice a day.  ? ondansetron (ZOFRAN) 4 MG tablet Take 1 tablet (4 mg total) by mouth every 6 (six) hours as needed for nausea.  ? oxyCODONE (OXY IR/ROXICODONE) 5 MG  immediate release tablet Take 5 mg by mouth. (Patient not taking: Reported on 05/04/2021)  ? ?No facility-administered encounter medications on file as of 05/04/2021.  ?  ? ?Past Medical History:  ?Diagnosis Date  ? Class 3 obesity (H[facility] 12/16/2020  ? Diabetes mellitus   ? ? ?Past Surgical History:  ?Procedure Laterality Date  ? EXTREMITY CYST EXCISION    ? MINOR IRRIGATION AND DEBRIDEMENT OF WOUND Right 03/10/2021  ? Procedure: MINOR IRRIGATION AND DEBRIDEMENT OF WOUND;  Surgeon: RArmond Hang MD;  Location: [facility];  Service: Orthopedics;  Laterality: Right;  ? 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 Procedure: TRANSMETATARSAL AMPUTATION;  Surgeon: RArmond Hang MD;  Location: [facility];  Service: Orthopedics;  Laterality: Right;  ? ? ?Family History  ?Problem Relation Age of Onset  ? Hypertension Mother   ? Diabetes Mother   ? Diabetes Other   ? ? ?Social History  ? ?Social History Narrative  ? Not on file  ?  ? ?Review of Systems ?General: Denies fevers, chills, weight loss ?CV: Denies chest pain, shortness of breath, palpitations ? ? ?Physical Exam ? ?  04/24/2021  ? 12:17 PM 04/24/2021  ? 11:49 AM 03/27/2021  ? 11:44 AM  ?Vitals with BMI  ?Height   5' 9"   ?Weight 305 lbs  304 lbs  ?BMI 45.02  44.87  ?Systolic  120 107  ?Diastolic  81 72  ?Pulse  88 79  ?  ?General:  No acute distress,  Alert and oriented, Non-Toxic, Normal speech and affect ?Extremity: 2 x 1 cm wound in the center of the transmetatarsal amputation incision site.  The patient appears to have a thin layer of fibrous tissue over the bone. ? ?Assessment/Plan ?Status post transmetatarsal amputation.  I think that the tissue in the wound base is not vascular enough to have a good likelihood of take for a skin graft or skin graft substitute.  I think the wound VAC is a good idea even though the wound is very small this may stimulate some granulation tissue.  Patient is also supposed to see orthopedics back in early May.  I can see her back  after that to check her progress.   ? ?Lennice Sites ?05/04/2021, 10:08 AM  ? ? ?  ?

## 2021-05-07 ENCOUNTER — Encounter (HOSPITAL_BASED_OUTPATIENT_CLINIC_OR_DEPARTMENT_OTHER): Payer: Medicaid Other | Admitting: Internal Medicine

## 2021-05-07 ENCOUNTER — Other Ambulatory Visit: Payer: Self-pay

## 2021-05-07 ENCOUNTER — Ambulatory Visit: Payer: Medicaid Other | Attending: Primary Care | Admitting: Pharmacist

## 2021-05-07 ENCOUNTER — Encounter: Payer: Self-pay | Admitting: Pharmacist

## 2021-05-07 DIAGNOSIS — L97514 Non-pressure chronic ulcer of other part of right foot with necrosis of bone: Secondary | ICD-10-CM | POA: Diagnosis not present

## 2021-05-07 DIAGNOSIS — E1165 Type 2 diabetes mellitus with hyperglycemia: Secondary | ICD-10-CM | POA: Diagnosis not present

## 2021-05-07 DIAGNOSIS — E11621 Type 2 diabetes mellitus with foot ulcer: Secondary | ICD-10-CM | POA: Diagnosis not present

## 2021-05-07 MED ORDER — GABAPENTIN 300 MG PO CAPS
600.0000 mg | ORAL_CAPSULE | Freq: Three times a day (TID) | ORAL | 2 refills | Status: DC
  Filled 2021-05-07 – 2021-08-27 (×3): qty 180, 30d supply, fill #0
  Filled 2021-11-14: qty 180, 30d supply, fill #1
  Filled 2022-04-02: qty 180, 30d supply, fill #2

## 2021-05-07 MED ORDER — TRULICITY 1.5 MG/0.5ML ~~LOC~~ SOAJ
1.5000 mg | SUBCUTANEOUS | 2 refills | Status: DC
  Filled 2021-05-07 – 2021-05-31 (×2): qty 2, 28d supply, fill #0
  Filled 2021-07-26: qty 2, 28d supply, fill #1
  Filled 2021-09-10: qty 2, 28d supply, fill #2

## 2021-05-07 NOTE — Progress Notes (Signed)
Marmol, Niara L. ([id number]) ?Visit Report for 05/07/2021 ?Arrival Information Details ?Patient Name: Date of Service: ?[name] V IS, [name] 05/07/2021 10:30 A M ?Medical Record Number: [id number] ?Patient Account Number: [id number] ?Date of Birth/Sex: Treating RN: ?Oct 07, 1980 (41 y.o. [name] ?Primary Care Jasyah Theurer: Gwinda Passe Other Clinician: ?Referring Puneet Selden: ?Treating Anglia Blakley/Extender: Geralyn Corwin ?Gwinda Passe ?Weeks in Treatment: 15 ?Visit Information History Since Last Visit ?Added or deleted any medications: No ?Patient Arrived: Wheel Chair ?Any new allergies or adverse reactions: No ?Arrival Time: 10:58 ?Had a fall or experienced change in No ?Transfer Assistance: None ?activities of daily living that may affect ?Patient Requires Transmission-Based No ?risk of falls: ?Precautions: ?Signs or symptoms of abuse/neglect since last visito No ?Patient Has Alerts: Yes ?Hospitalized since last visit: No ?Patient Alerts: Patient on Blood Thinner ?Implantable device outside of the clinic excluding No ?PICC R Arm ?cellular tissue based products placed in the center ?ABI 12/17/20 R=1.08 ?since last visit: ?L=1.13 ?Has Dressing in Place as Prescribed: Yes ?Pain Present Now: No ?Electronic Signature(s) ?Signed: 05/07/2021 4:57:13 PM By: Antonieta Iba ?Entered By: Antonieta Iba on 05/07/2021 11:01:17 ?-------------------------------------------------------------------------------- ?Encounter Discharge Information Details ?Patient Name: Date of Service: ?[name] V IS, [name] 05/07/2021 10:30 A M ?Medical Record Number: [id number] ?Patient Account Number: [id number] ?Date of Birth/Sex: Treating RN: ?January 06, 1981 (41 y.o. [name] ?Primary Care Monae Topping: Gwinda Passe Other Clinician: ?Referring Iveth Heidemann: ?Treating Nabor Thomann/Extender: Geralyn Corwin ?Gwinda Passe ?Weeks in Treatment: 15 ?Encounter Discharge Information Items Post Procedure Vitals ?Discharge Condition: Stable ?Temperature  (F): 98.7 ?Ambulatory Status: Wheelchair ?Pulse (bpm): 76 ?Discharge Destination: Home ?Respiratory Rate (breaths/min): 18 ?Transportation: Private Auto ?Blood Pressure (mmHg): 115/78 ?Schedule Follow-up Appointment: Yes ?Clinical Summary of Care: Provided on 05/07/2021 ?Form Type Recipient ?Paper Patient Patient ?Electronic Signature(s) ?Signed: 05/07/2021 4:57:13 PM By: Antonieta Iba ?Entered By: Antonieta Iba on 05/07/2021 11:36:52 ?-------------------------------------------------------------------------------- ?Lower Extremity Assessment Details ?Patient Name: ?Date of Service: ?[name] V IS, [name] 05/07/2021 10:30 A M ?Medical Record Number: [id number] ?Patient Account Number: [id number] ?Date of Birth/Sex: ?Treating RN: ?1980-05-23 (41 y.o. [name] ?Primary Care Kiah Keay: Gwinda Passe ?Other Clinician: ?Referring Jlynn Langille: ?Treating Rise Traeger/Extender: Geralyn Corwin ?Gwinda Passe ?Weeks in Treatment: 15 ?Edema Assessment ?Assessed: [Left: No] [Right: Yes] ?Edema: [Left: N] [Right: o] ?Calf ?Left: Right: ?Point of Measurement: 34 cm From Medial Instep 45 cm ?Ankle ?Left: Right: ?Point of Measurement: 8 cm From Medial Instep 25 cm ?Vascular Assessment ?Pulses: ?Dorsalis Pedis ?Palpable: [Right:Yes] ?Electronic Signature(s) ?Signed: 05/07/2021 4:57:13 PM By: Antonieta Iba ?Entered By: Antonieta Iba on 05/07/2021 11:04:22 ?-------------------------------------------------------------------------------- ?Multi Wound Chart Details ?Patient Name: ?Date of Service: ?[name] V IS, [name] 05/07/2021 10:30 A M ?Medical Record Number: [id number] ?Patient Account Number: [id number] ?Date of Birth/Sex: ?Treating RN: ?July 31, 1980 (41 y.o. [name] ?Primary Care Alinna Siple: Gwinda Passe ?Other Clinician: ?Referring Skyleigh Windle: ?Treating Dava Rensch/Extender: Geralyn Corwin ?Gwinda Passe ?Weeks in Treatment: 15 ?Vital Signs ?Height(in): 69 ?Pulse(bpm): 76 ?Weight(lbs): ?Blood Pressure(mmHg):  115/78 ?Body Mass Index(BMI): ?Temperature(??F): 98.7 ?Respiratory Rate(breaths/min): 18 ?Photos: [1:Right Amputation Site -] [N/A:N/A N/A] ?Wound Location: [1:Transmetatarsal Surgical Injury] [N/A:N/A] ?Wounding Event: [1:Diabetic Wound/Ulcer of the Lower] [N/A:N/A] ?Primary Etiology: [1:Extremity Hypertension, Type II Diabetes,] [N/A:N/A] ?Comorbid History: [1:Osteomyelitis, Neuropathy 12/15/2020] [N/A:N/A] ?Date Acquired: [1:15] [N/A:N/A] ?Weeks of Treatment: [1:Open] [N/A:N/A] ?Wound Status: [1:No] [N/A:N/A] ?Wound Recurrence: [1:1.5x6x0.5] [N/A:N/A] ?Measurements L x W x D (cm) [1:7.069] [N/A:N/A] ?A (cm?) : ?rea [1:3.534] [N/A:N/A] ?Volume (cm?) : [1:83.40%] [N/A:N/A] ?% Reduction in A [1:rea: 94.80%] [N/A:N/A] ?% Reduction in Volume: [1:Grade 3] [N/A:N/A] ?  Classification: [1:Medium] [N/A:N/A] ?Exudate A mount: [1:Serosanguineous] [N/A:N/A] ?Exudate Type: [1:red, brown] [N/A:N/A] ?Exudate Color: [1:Thickened] [N/A:N/A] ?Wound Margin: [1:Medium (34-66%)] [N/A:N/A] ?Granulation A mount: [1:Red, Pink] [N/A:N/A] ?Granulation Quality: [1:Medium (34-66%)] [N/A:N/A] ?Necrotic A mount: ?[1:Fat Layer (Subcutaneous Tissue): Yes N/A] ?Exposed Structures: ?[1:Fascia: No Tendon: No Muscle: No Joint: No Bone: No None] [N/A:N/A] ?Epithelialization: [1:Debridement - Excisional] [N/A:N/A] ?Debridement: ?Pre-procedure Verification/Time Out 11:21 [N/A:N/A] ?Taken: [1:Other] [N/A:N/A] ?Pain Control: [1:Subcutaneous, Slough] [N/A:N/A] ?Tissue Debrided: [1:Skin/Subcutaneous Tissue] [N/A:N/A] ?Level: [1:4.5] [N/A:N/A] ?Debridement A (sq cm): [1:rea Curette] [N/A:N/A] ?Instrument: [1:Minimum] [N/A:N/A] ?Bleeding: [1:Pressure] [N/A:N/A] ?Hemostasis A chieved: [1:Procedure was tolerated well] [N/A:N/A] ?Debridement Treatment Response: [1:1.5x6x0.5] [N/A:N/A] ?Post Debridement Measurements L x ?W x D (cm) [1:3.534] [N/A:N/A] ?Post Debridement Volume: (cm?) [1:Debridement] [N/A:N/A] ?Treatment Notes ?Wound #1 (Amputation Site -  Transmetatarsal) Wound Laterality: Right ?Cleanser ?Normal Saline ?Discharge Instruction: Cleanse the wound with Normal Saline prior to applying a clean dressing using gauze sponges, not tissue or cotton balls. ?Peri-Wound Care ?Topical ?Primary Dressing ?Hydrofera Blue Ready Foam, 4x5 in ?Discharge Instruction: Apply to wound bed as instructed ?Secondary Dressing ?ABD Pad, 5x9 ?Discharge Instruction: Apply over primary dressing as directed. ?Woven Gauze Sponge, Non-Sterile 4x4 in ?Discharge Instruction: Apply over primary dressing as directed. ?Secured With ?Elastic Bandage 4 inch (ACE bandage) ?Discharge Instruction: Secure with ACE bandage as directed. ?Kerlix Roll Sterile, 4.5x3.1 (in/yd) ?Discharge Instruction: Secure with Kerlix as directed. ?30M Medipore H Soft Cloth Surgical T ape, 4 x 10 (in/yd) ?Discharge Instruction: Secure with tape as directed. ?Compression Wrap ?Compression Stockings ?Add-Ons ?Electronic Signature(s) ?Signed: 05/07/2021 11:45:46 AM By: Geralyn Corwin DO ?Signed: 05/07/2021 4:57:13 PM By: Antonieta Iba ?Entered By: Geralyn Corwin on 05/07/2021 11:39:10 ?-------------------------------------------------------------------------------- ?Multi-Disciplinary Care Plan Details ?Patient Name: ?Date of Service: ?[name] V IS, [name] 05/07/2021 10:30 A M ?Medical Record Number: [id number] ?Patient Account Number: [id number] ?Date of Birth/Sex: ?Treating RN: ?1980-11-03 (41 y.o. [name] ?Primary Care Hoa Deriso: Gwinda Passe ?Other Clinician: ?Referring Robinette Esters: ?Treating Chellsea Beckers/Extender: Geralyn Corwin ?Gwinda Passe ?Weeks in Treatment: 15 ?Active Inactive ?Nutrition ?Nursing Diagnoses: ?Impaired glucose control: actual or potential ?Goals: ?Patient/caregiver verbalizes understanding of need to maintain therapeutic glucose control per primary care physician ?Date Initiated: 01/22/2021 ?Target Resolution Date: 06/04/2021 ?Goal Status: Active ?Interventions: ?Assess HgA1c results  as ordered upon admission and as needed ?Provide education on elevated blood sugars and impact on wound healing ?Treatment Activities: ?Obtain HgA1c : 01/22/2021 ?Notes: ?Wound/Skin Impairment ?Nursing Diagnoses: ?Impair

## 2021-05-07 NOTE — Progress Notes (Signed)
Petrelli, Makyna L. ([id number]) ?Visit Report for 05/07/2021 ?Chief Complaint Document Details ?Patient Name: Date of Service: ?[name], [name] 05/07/2021 10:30 A M ?Medical Record Number: [id number] ?Patient Account Number: [id number] ?Date of Birth/Sex: Treating RN: ?23-Nov-1980 (41 y.o. [name] ?Primary Care Provider: Gwinda Passe Other Clinician: ?Referring Provider: ?Treating Provider/Extender: Geralyn Corwin ?Gwinda Passe ?Weeks in Treatment: 15 ?Information Obtained from: Patient ?Chief Complaint ?Osteomyelitis of the right foot status post transmetatarsal amputation with surgical site dehiscence ?Electronic Signature(s) ?Signed: 05/07/2021 11:45:46 AM By: Geralyn Corwin DO ?Entered By: Geralyn Corwin on 05/07/2021 11:39:30 ?-------------------------------------------------------------------------------- ?Debridement Details ?Patient Name: Date of Service: ?[name], [name] 05/07/2021 10:30 A M ?Medical Record Number: [id number] ?Patient Account Number: [id number] ?Date of Birth/Sex: Treating RN: ?09-Jul-1980 (41 y.o. [name] ?Primary Care Provider: Gwinda Passe Other Clinician: ?Referring Provider: ?Treating Provider/Extender: Geralyn Corwin ?Gwinda Passe ?Weeks in Treatment: 15 ?Debridement Performed for Assessment: Wound #1 Right Amputation Site - Transmetatarsal ?Performed By: Physician Geralyn Corwin, DO ?Debridement Type: Debridement ?Severity of Tissue Pre Debridement: Fat layer exposed ?Level of Consciousness (Pre-procedure): Awake and Alert ?Pre-procedure Verification/Time Out Yes - 11:21 ?Taken: ?Start Time: 11:22 ?Pain Control: ?Other : Benzocaine ?T Area Debrided (L x W): ?otal 1.5 (cm) x 3 (cm) = 4.5 (cm?) ?Tissue and other material debrided: Non-Viable, Slough, Subcutaneous, Biofilm, Slough ?Level: Skin/Subcutaneous Tissue ?Debridement Description: Excisional ?Instrument: Curette ?Bleeding: Minimum ?Hemostasis Achieved: Pressure ?End Time:  11:29 ?Response to Treatment: Procedure was tolerated well ?Level of Consciousness (Post- Awake and Alert ?procedure): ?Post Debridement Measurements of Total Wound ?Length: (cm) 1.5 ?Width: (cm) 6 ?Depth: (cm) 0.5 ?Volume: (cm?) 3.534 ?Character of Wound/Ulcer Post Debridement: Stable ?Severity of Tissue Post Debridement: Fat layer exposed ?Post Procedure Diagnosis ?Same as Pre-procedure ?Electronic Signature(s) ?Signed: 05/07/2021 11:45:46 AM By: Geralyn Corwin DO ?Signed: 05/07/2021 4:57:13 PM By: Antonieta Iba ?Entered By: Antonieta Iba on 05/07/2021 11:30:06 ?-------------------------------------------------------------------------------- ?HPI Details ?Patient Name: Date of Service: ?[name], [name] 05/07/2021 10:30 A M ?Medical Record Number: [id number] ?Patient Account Number: [id number] ?Date of Birth/Sex: Treating RN: ?April 27, 1980 (41 y.o. [name] ?Primary Care Provider: Gwinda Passe Other Clinician: ?Referring Provider: ?Treating Provider/Extender: Geralyn Corwin ?Gwinda Passe ?Weeks in Treatment: 15 ?History of Present Illness ?HPI Description: Admission 01/22/2021 ?Ms. 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 She has had dehiscence of ?the surgical site. She is currently using wet-to-dry dressings. She has a PICC line and receiving IV ceftriaxone daily for her osteomyelitis. There is an end date ?of 01/27/2021. She is also taking oral metronidazole. She currently denies systemic signs of infection. ?1/19; patient presents for follow-up. She was diagnosed with a DVT to the right lower extremity 2 days ago. She is on Eliquis now. She is scheduled to see her ?infectious disease doctor tomorrow. She has been using Dakin's wet-to-dry dressings. She  denies systemic signs of infection. ?1/26; patient presents for follow-up. She saw infectious disease on 1/21 started on Augmentin. Her PICC line and IV ceftriaxone was discontinued. Patient ?reports stability to her wound. She has been using Dakin's wet-to-dry dressings. She currently denies systemic signs of infection. ?2/3; patient presents for follow-up. She continues to use Dakin's wet-to-dry dressings to the wound bed. She saw Dr. Manson Passey with infectious disease ?yesterday and is continuing Augmentin. T  entative end date is 2/16. Patient reports following up with orthopedics. She states there is no further plan from them. ?She currently denies systemic signs of infection. ?2/10; patient presents for follow-up. She continues to use Dakin's wet to dry dressings. She is scheduled to have her MRI done on 2/14. She states that she ?had pain to the debridement site from last clinic visit and declines debridement today. She denies systemic signs of infection. She continues to have yellow ?thick drainage. ?2/20; patient presents for follow-up. She continues to use Dakin's wet-to-dry dressings. She obtained her MRI. The results showed an abscess and she is ?scheduled to see her orthopedic surgeon on 2/23. She saw infectious disease 2/17 and her antibiotics were extended. She currently denies systemic signs of ?infection. ?3/6; patient presents for follow-up. She had debridement and irrigation of her foot on 03/10/2021 due to abscess noted on MRI. She was started on IV ?ceftriaxone and oral Flagyl. She has no issues or complaints today. She has been using iodoform packing to the tunnel and Dakin's wet-to-dry to the opening. ?03/26/2021: She continues on IV ceftriaxone and oral metronidazole. She has follow-up with infectious disease tomorrow. No significant issues or complaints ?today. Her mother continues to help her with her wound dressing, using iodoform packing strips into the tunnel and Dakin's to the open portion  of the wound. ?3/20; patient presents for follow-up. She continues to be on IV ceftriaxone in oral metronidazole. She has been using iodoform to the tunnel and Dakin's wet-to- ?dry to the open wound. She denies signs of infection. ?3/27; patient presents for follow-up. She no longer has a PICC line. She has been using iodoform to the tunnel and Dakin's wet-to-dry to the open wound. She ?reports improvement in wound healing. She denies signs of infection. ?4/3; patient presents for follow-up. She states she has been using Hydrofera Blue to the open wound and iodoform packing to the tunnel without any issues. ?She denies signs of infection. ?4/18; patient presents for follow-up. She saw infectious disease on 4/11. She has finished her oral antibiotics and completed a total of 6 weeks of antibiotics ?(this includes IV as well). No further antibiotics needed. She has been using Hydrofera Blue and iodoform packing. She states that the tunneled area has come ?in and the iodoform is not staying in place anymore. She has no issues or complaints today. She denies signs of infection. ?4/24; patient presents for follow-up. She saw Dr. Carlene Coria, plastic surgery to discuss potential skin graft/substitute placement. At this time he thinks that the ?skin graft would likely not take. He is in agreement with trying a wound VAC. Patient has been using Hydrofera Blue dressing changes with no issues. She ?denies signs of infection. She reports improvement in wound healing. ?Electronic Signature(s) ?Signed: 05/07/2021 11:45:46 AM By: Geralyn Corwin DO ?Entered By: Geralyn Corwin on 05/07/2021 11:40:28 ?-------------------------------------------------------------------------------- ?Physical Exam Details ?Patient Name: Date of Service: ?[name], [name] 05/07/2021 10:30 A M ?Medical Record Number: [id number] ?Patient Account Number: [id number] ?Date of Birth/Sex: Treating RN: ?Apr 12, 1980 (41 y.o. [name] ?Primary Care  Provider: Gwinda Passe Other Clinician: ?Referring Provider: ?Treating Provider/Extender: Geralyn Corwin ?Gwinda Passe ?Weeks in Treatment: 15 ?Constitutional ?respirations regular, non-labored and within target

## 2021-05-14 ENCOUNTER — Encounter (HOSPITAL_BASED_OUTPATIENT_CLINIC_OR_DEPARTMENT_OTHER): Payer: Medicaid Other | Attending: Internal Medicine | Admitting: Internal Medicine

## 2021-05-14 ENCOUNTER — Other Ambulatory Visit: Payer: Self-pay

## 2021-05-14 DIAGNOSIS — M869 Osteomyelitis, unspecified: Secondary | ICD-10-CM | POA: Insufficient documentation

## 2021-05-14 DIAGNOSIS — E11621 Type 2 diabetes mellitus with foot ulcer: Secondary | ICD-10-CM | POA: Diagnosis not present

## 2021-05-14 DIAGNOSIS — L97514 Non-pressure chronic ulcer of other part of right foot with necrosis of bone: Secondary | ICD-10-CM | POA: Insufficient documentation

## 2021-05-14 NOTE — Progress Notes (Signed)
Mackley, Sravya L. ([id number]) ?Visit Report for 05/14/2021 ?Arrival Information Details ?Patient Name: Date of Service: ?[name] V IS, [name] 05/14/2021 2:00 PM ?Medical Record Number: [id number] ?Patient Account Number: [id number] ?Date of Birth/Sex: Treating RN: ?[birth year], [name] (41 y.o. F) [name], [name] ?Primary Care Janesia Joswick: Gwinda Passe Other Clinician: ?Referring Cortne Amara: ?Treating Azelyn Batie/Extender: Geralyn Corwin ?Gwinda Passe ?Weeks in Treatment: 16 ?Visit Information History Since Last Visit ?Added or deleted any medications: No ?Patient Arrived: Wheel Chair ?Any new allergies or adverse reactions: No ?Arrival Time: 14:31 ?Had a fall or experienced change in No ?Accompanied By: family member ?activities of daily living that may affect ?Transfer Assistance: None ?risk of falls: ?Patient Identification Verified: Yes ?Signs or symptoms of abuse/neglect since No ?Secondary Verification Process Completed: Yes last visito ?Patient Requires Transmission-Based No ?Hospitalized since last visit: No ?Precautions: ?Implantable device outside of the clinic No ?Patient Has Alerts: Yes ?excluding ?Patient Alerts: Patient on Blood Thinner cellular tissue based products placed in the ?PICC R Arm center ?ABI 12/17/20 R=1.08 since last visit: ?L=1.13 Has Dressing in Place as Prescribed: Yes ?Has Footwear/Offloading in Place as Yes ?Prescribed: ?Right: Surgical Shoe with Pressure Relief Insole ?Pain Present Now: No ?Electronic Signature(s) ?Signed: 05/14/2021 6:01:58 PM By: Shawn Stall RN, BSN ?Entered By: Shawn Stall on 05/14/2021 14:37:33 ?-------------------------------------------------------------------------------- ?Clinic Level of Care Assessment Details ?Patient Name: Date of Service: ?[name] V IS, [name] 05/14/2021 2:00 PM ?Medical Record Number: [id number] ?Patient Account Number: [id number] ?Date of Birth/Sex: Treating RN: ?[birth year] [name] (41 y.o. [name] ?Primary Care Emigdio Wildeman: Gwinda Passe Other  Clinician: ?Referring Amay Mijangos: ?Treating Jirah Rider/Extender: Geralyn Corwin ?Gwinda Passe ?Weeks in Treatment: 16 ?Clinic Level of Care Assessment Items ?TOOL 4 Quantity Score ?X- 1 0 ?Use when only an EandM [name] ?ASSESSMENTS - Nursing Assessment / Reassessment ?X- 1 10 ?Reassessment of Co-morbidities (includes updates in patient status) ?X- 1 5 ?Reassessment of Adherence to Treatment Plan ?ASSESSMENTS - Wound and Skin A ssessment / Reassessment ?X - Simple Wound Assessment / Reassessment - one wound 1 5 ?[]  - 0 ?Complex Wound Assessment / Reassessment - multiple wounds ?[]  - 0 ?Dermatologic / Skin Assessment (not related to wound area) ?ASSESSMENTS - Focused Assessment ?[]  - 0 ?Circumferential Edema Measurements - multi extremities ?[]  - 0 ?Nutritional Assessment / Counseling / Intervention ?[]  - 0 ?Lower Extremity Assessment (monofilament, tuning fork, pulses) ?[]  - 0 ?Peripheral Arterial Disease Assessment (using hand held doppler) ?ASSESSMENTS - Ostomy and/or Continence Assessment and Care ?[]  - 0 ?Incontinence Assessment and Management ?[]  - 0 ?Ostomy Care Assessment and Management (repouching, etc.) ?PROCESS - Coordination of Care ?[]  - 0 ?Simple Patient / Family Education for ongoing care ?X- 1 20 ?Complex (extensive) Patient / Family Education for ongoing care ?[]  - 0 ?Staff obtains Consents, Records, T Results / Process Orders ?est ?[]  - 0 ?Staff telephones HHA, Nursing Homes / Clarify orders / etc ?[]  - 0 ?Routine Transfer to another Facility (non-emergent condition) ?[]  - 0 ?Routine Hospital Admission (non-emergent condition) ?[]  - 0 ?New Admissions / / Ordering NPWT Apligraf, etc. ?, ?[]  - 0 ?Emergency Hospital Admission (emergent condition) ?[]  - 0 ?Simple Discharge Coordination ?[]  - 0 ?Complex (extensive) Discharge Coordination ?PROCESS - Special Needs ?[]  - 0 ?Pediatric / Minor Patient Management ?[]  - 0 ?Isolation Patient Management ?[]  -  0 ?Hearing / Language / Visual special needs ?[]  - 0 ?Assessment of Community assistance (transportation, D/C planning, etc.) ?[]  - 0 ?Additional assistance / Altered mentation ?[]  - 0 ?  Support Surface(s) Assessment (bed, cushion, seat, etc.) ?INTERVENTIONS - Wound Cleansing / Measurement ?X - Simple Wound Cleansing - one wound 1 5 ?[]  - 0 ?Complex Wound Cleansing - multiple wounds ?X- 1 5 ?Wound Imaging (photographs - any number of wounds) ?[]  - 0 ?Wound Tracing (instead of photographs) ?X- 1 5 ?Simple Wound Measurement - one wound ?[]  - 0 ?Complex Wound Measurement - multiple wounds ?INTERVENTIONS - Wound Dressings ?[]  - 0 ?Small Wound Dressing one or multiple wounds ?X- 1 15 ?Medium Wound Dressing one or multiple wounds ?[]  - 0 ?Large Wound Dressing one or multiple wounds ?[]  - 0 ?Application of Medications - topical ?[]  - 0 ?Application of Medications - injection ?INTERVENTIONS - Miscellaneous ?[]  - 0 ?External ear exam ?[]  - 0 ?Specimen Collection (cultures, biopsies, blood, body fluids, etc.) ?[]  - 0 ?Specimen(s) / Culture(s) sent or taken to Lab for analysis ?[]  - 0 ?Patient Transfer (multiple staff / / Similar devices) ?[]  - 0 ?Simple Staple / Suture removal (25 or less) ?[]  - 0 ?Complex Staple / Suture removal (26 or more) ?[]  - 0 ?Hypo / Hyperglycemic Management (close monitor of Blood Glucose) ?[]  - 0 ?Ankle / Brachial Index (ABI) - do not check if billed separately ?X- 1 5 ?Vital Signs ?Has the patient been seen at the hospital within the last three years: Yes ?Total Score: 75 ?Level Of Care: New/Established - Level 2 ?Electronic Signature(s) ?Signed: 05/14/2021 5:13:39 PM By: ?Entered By: on 05/14/2021 15:09:18 ?-------------------------------------------------------------------------------- ?Encounter Discharge Information Details ?Patient Name: Date of Service: ?[name] V IS, [name] 05/14/2021 2:00 PM ?Medical Record Number: ?Patient Account Number:  ?Date of Birth/Sex: Treating RN: ?[name], [name] (41 y.o. ?Primary Care Oren Barella: Nurse, adult Other Clinician: ?Referring Paola Flynt: ?Treating Rex Magee/Extender: ? ?Weeks in Treatment: 16 ?Encounter Discharge Information Items ?Discharge Condition: Stable ?Ambulatory Status: Wheelchair ?Discharge Destination: Home ?Transportation: Private Auto ?Schedule Follow-up Appointment: Yes ?Clinical Summary of Care: Provided on 05/14/2021 ?Form Type Recipient ?Paper Patient Patient ?Electronic Signature(s) ?Signed: 05/14/2021 5:13:39 PM By: 07/14/2021 ?Entered By: Antonieta Iba on 05/14/2021 15:24:05 ?-------------------------------------------------------------------------------- ?Lower Extremity Assessment Details ?Patient Name: Date of Service: ?[name] V IS, [name] 05/14/2021 2:00 PM ?Medical Record Number: 07/14/2021 ?Patient Account Number: [id number] ?Date of Birth/Sex: Treating RN: ?[name][birth year] (41 y.o. F) [name], [name] ?Primary Care Graeson Nouri: 25 Other Clinician: ?Referring Deejay Koppelman: ?Treating Francys Bolin/Extender: [name] ?Gwinda Passe ?Weeks in Treatment: 16 ?Edema Assessment ?Assessed: [Left: No] [Right: Yes] ?Edema: [Left: N] [Right: o] ?Calf ?Left: Right: ?Point of Measurement: 34 cm From Medial Instep 45 cm ?Ankle ?Left: Right: ?Point of Measurement: 8 cm From Medial Instep 25 cm ?Vascular Assessment ?Pulses: ?Dorsalis Pedis ?Palpable: [Right:Yes] ?Electronic Signature(s) ?Signed: 05/14/2021 6:01:58 PM By: Gwinda Passe RN, BSN ?Entered By: 07/14/2021 on 05/14/2021 14:42:04 ?-------------------------------------------------------------------------------- ?Multi Wound Chart Details ?Patient Name: ?Date of Service: ?[name] V IS, [name] 05/14/2021 2:00 PM ?Medical Record Number: Antonieta Iba ?Patient Account Number: 07/14/2021 ?Date of Birth/Sex: ?Treating RN: ?[name] (41 y.o. [id number] ?Primary Care Nedim Oki: 5/26/[name] ?Other Clinician: ?Referring Bertine Schlottman: ?Treating Eusevio Schriver/Extender: 24 ?Gwinda Passe ?Weeks in Treatment: 16 ?Vital Signs ?Height(in): 69 ?Pulse(bpm): 94 ?Weight(lbs): ?Blood Pressure(mmHg): 122/79

## 2021-05-14 NOTE — Progress Notes (Signed)
Bruney, Shetara L. ([id number]) ?Visit Report for 05/14/2021 ?Chief Complaint Document Details ?Patient Name: Date of Service: ?[name] V IS, [name] 05/14/2021 2:00 PM ?Medical Record Number: [id number] ?Patient Account Number: [id number] ?Date of Birth/Sex: Treating RN: ?05-Jul-1980 (41 y.o. [name] ?Primary Care Provider: Gwinda Passe Other Clinician: ?Referring Provider: ?Treating Provider/Extender: Geralyn Corwin ?Gwinda Passe ?Weeks in Treatment: 16 ?Information Obtained from: Patient ?Chief Complaint ?Osteomyelitis of the right foot status post transmetatarsal amputation with surgical site dehiscence ?Electronic Signature(s) ?Signed: 05/14/2021 3:16:52 PM By: Geralyn Corwin DO ?Entered By: Geralyn Corwin on 05/14/2021 15:09:49 ?-------------------------------------------------------------------------------- ?HPI Details ?Patient Name: Date of Service: ?[name] V IS, [name] 05/14/2021 2:00 PM ?Medical Record Number: [id number] ?Patient Account Number: [id number] ?Date of Birth/Sex: Treating RN: ?01-Dec-1980 (41 y.o. [name] ?Primary Care Provider: Gwinda Passe Other Clinician: ?Referring Provider: ?Treating Provider/Extender: Geralyn Corwin ?Gwinda Passe ?Weeks in Treatment: 16 ?History of Present Illness ?HPI Description: Admission 01/22/2021 ?[name] [name] a 41 year old female with a past medical history of insulin-dependent uncontrolled type 2 diabetes with last hemoglobin A1c of 13.5, ?osteomyelitis of the right foot status post transmetatarsal amputation on 12/18/2020 that presents to the clinic for right foot wound. She has had dehiscence of ?the surgical site. [name] using wet-to-dry dressings. She has a PICC line and receiving IV ceftriaxone daily for her osteomyelitis. There [name] an end date ?of 01/27/2021. [name] also taking oral metronidazole. She currently denies systemic signs of infection. ?1/19; patient presents for follow-up. She was diagnosed with a DVT to  the right lower extremity 2 days ago. [name] on Eliquis now. [name] scheduled to see her ?infectious disease doctor tomorrow. She has been using Dakin's wet-to-dry dressings. She denies systemic signs of infection. ?1/26; patient presents for follow-up. She saw infectious disease on 1/21 started on Augmentin. Her PICC line and IV ceftriaxone was discontinued. Patient ?reports stability to her wound. She has been using Dakin's wet-to-dry dressings. She currently denies systemic signs of infection. ?2/3; patient presents for follow-up. She continues to use Dakin's wet-to-dry dressings to the wound bed. She saw Dr. Manson Passey with infectious disease ?yesterday and [name] continuing Augmentin. T entative end date [name] 2/16. Patient reports following up with orthopedics. She states there [name] no further plan from them. ?She currently denies systemic signs of infection. ?2/10; patient presents for follow-up. She continues to use Dakin's wet to dry dressings. [name] scheduled to have her MRI done on 2/14. She states that she ?had pain to the debridement site from last clinic visit and declines debridement today. She denies systemic signs of infection. She continues to have yellow ?thick drainage. ?2/20; patient presents for follow-up. She continues to use Dakin's wet-to-dry dressings. She obtained her MRI. The results showed an abscess and [name] ?scheduled to see her orthopedic surgeon on 2/23. She saw infectious disease 2/17 and her antibiotics were extended. She currently denies systemic signs of ?infection. ?3/6; patient presents for follow-up. She had debridement and irrigation of her foot on 03/10/2021 due to abscess noted on MRI. She was started on IV ?ceftriaxone and oral Flagyl. She has no issues or complaints today. She has been using iodoform packing to the tunnel and Dakin's wet-to-dry to the opening. ?03/26/2021: She continues on IV ceftriaxone and oral metronidazole. She has follow-up with infectious disease tomorrow. No  significant issues or complaints ?today. Her mother continues to help her with her wound dressing, using iodoform packing strips into the tunnel and Dakin's to the  open portion of the wound. ?3/20; patient presents for follow-up. She continues to be on IV ceftriaxone in oral metronidazole. She has been using iodoform to the tunnel and Dakin's wet-to- ?dry to the open wound. She denies signs of infection. ?3/27; patient presents for follow-up. She no longer has a PICC line. She has been using iodoform to the tunnel and Dakin's wet-to-dry to the open wound. She ?reports improvement in wound healing. She denies signs of infection. ?4/3; patient presents for follow-up. She states she has been using Hydrofera Blue to the open wound and iodoform packing to the tunnel without any issues. ?She denies signs of infection. ?4/18; patient presents for follow-up. She saw infectious disease on 4/11. She has finished her oral antibiotics and completed a total of 6 weeks of antibiotics ?(this includes IV as well). No further antibiotics needed. She has been using Hydrofera Blue and iodoform packing. She states that the tunneled area has come ?in and the iodoform [name] not staying in place anymore. She has no issues or complaints today. She denies signs of infection. ?4/24; patient presents for follow-up. She saw Dr. Carlene Coria, plastic surgery to discuss potential skin graft/substitute placement. At this time he thinks that the ?skin graft would likely not take. He [name] in agreement with trying a wound VAC. Patient has been using Hydrofera Blue dressing changes with no issues. She ?denies signs of infection. She reports improvement in wound healing. ?5/1; patient presents for follow-up. Unfortunately patient did not have insurance when we ran for the [place]ico. There [name] an assistance program and we are trying to ?get this accommodated for the patient. In the meantime she has been using Hydrofera Blue without any issues. She denies signs of  infection. ?Electronic Signature(s) ?Signed: 05/14/2021 3:16:52 PM By: Geralyn Corwin DO ?Entered By: Geralyn Corwin on 05/14/2021 15:12:25 ?-------------------------------------------------------------------------------- ?Physical Exam Details ?Patient Name: Date of Service: ?[name] V IS, [name] 05/14/2021 2:00 PM ?Medical Record Number: [id number] ?Patient Account Number: [id number] ?Date of Birth/Sex: Treating RN: ?[birth year] (41 y.o. [name] ?Primary Care Provider: Gwinda Passe Other Clinician: ?Referring Provider: ?Treating Provider/Extender: Geralyn Corwin ?Gwinda Passe ?Weeks in Treatment: 16 ?Constitutional ?respirations regular, non-labored and within target range for patient.Marland Kitchen ?Cardiovascular ?2+ dorsalis pedis/posterior tibialis pulses. ?Psychiatric ?pleasant and cooperative. ?Notes ?Right foot: Transmetatarsal amputation with wound dehiscence. At the surgical site there [name] fibrinous tissue with some budding granulation tissue to the edges. ?Epithelization continues to occur at the edges. No signs of surrounding infection. ?Electronic Signature(s) ?Signed: 05/14/2021 3:16:52 PM By: Geralyn Corwin DO ?Entered By: Geralyn Corwin on 05/14/2021 15:14:06 ?-------------------------------------------------------------------------------- ?Physician Orders Details ?Patient Name: Date of Service: ?[name] V IS, [name] 05/14/2021 2:00 PM ?Medical Record Number: [id number] ?Patient Account Number: [id number] ?Date of Birth/Sex: Treating RN: ?09-21-1980 (41 y.o. [name] ?Primary Care Provider: Gwinda Passe Other Clinician: ?Referring Provider: ?Treating Provider/Extender: Geralyn Corwin ?Gwinda Passe ?Weeks in Treatment: 16 ?Verbal / Phone Orders: No ?Diagnosis Coding ?ICD-10 Coding ?Code Description ?L97.514 Non-pressure chronic ulcer of other part of right foot with necrosis of bone ?E11.621 Type 2 diabetes mellitus with foot ulcer ?M86.9 Osteomyelitis, unspecified ?Follow-up  Appointments ?ppointment in 1 week. - 05/21/21 at 2:45pm with Dr. Mikey Bussing (Room 7, [place]) ?Return A ?Bathing/ Shower/ Hygiene ?Other Bathing/Shower/Hygiene Orders/Instructions: - Clean with Saline or Dakins ?Ed

## 2021-05-21 ENCOUNTER — Other Ambulatory Visit: Payer: Self-pay

## 2021-05-21 ENCOUNTER — Encounter (HOSPITAL_BASED_OUTPATIENT_CLINIC_OR_DEPARTMENT_OTHER): Payer: Medicaid Other | Admitting: Internal Medicine

## 2021-05-21 DIAGNOSIS — L97514 Non-pressure chronic ulcer of other part of right foot with necrosis of bone: Secondary | ICD-10-CM

## 2021-05-21 NOTE — Progress Notes (Addendum)
Sheets, [name] ([id number]) ?Visit Report for 05/21/2021 ?Arrival Information Details ?Patient Name: Date of Service: ?[name], [name] 05/21/2021 9:30 A M ?Medical Record Number: [id number] ?Patient Account Number: [id number] ?Date of Birth/Sex: Treating RN: ?1980/02/18 (41 y.o. [name] ?Primary Care Alyshia Kernan: Juluis Mire Other Clinician: ?Referring Braylinn Gulden: ?Treating Lulamae Skorupski/Extender: Kalman Shan ?Juluis Mire ?Weeks in Treatment: 17 ?Visit Information History Since Last Visit ?Added or deleted any medications: No ?Patient Arrived: Wheel Chair ?Any new allergies or adverse reactions: No ?Arrival Time: 09:58 ?Had a fall or experienced change in No ?Transfer Assistance: None ?activities of daily living that may affect ?Patient Identification Verified: Yes ?risk of falls: ?Secondary Verification Process Completed: Yes ?Signs or symptoms of abuse/neglect since last visito No ?Patient Requires Transmission-Based No ?Hospitalized since last visit: No ?Precautions: ?Implantable device outside of the clinic excluding No ?Patient Has Alerts: Yes ?cellular tissue based products placed in the center ?Patient Alerts: Patient on Blood Thinner ?since last visit: ?PICC R Arm ?Has Dressing in Place as Prescribed: Yes ?ABI 12/17/20 R=1.08 ?Pain Present Now: No ?L=1.13 ?Electronic Signature(s) ?Signed: 05/21/2021 10:25:41 AM By: Lorrin Jackson ?Entered By: Lorrin Jackson on 05/21/2021 09:58:40 ?-------------------------------------------------------------------------------- ?Encounter Discharge Information Details ?Patient Name: Date of Service: ?[name], [name] 05/21/2021 9:30 A M ?Medical Record Number: [id number] ?Patient Account Number: [id number] ?Date of Birth/Sex: Treating RN: ?04-07-1980 (41 y.o. F) [name], [name] ?Primary Care Seville Downs: Juluis Mire Other Clinician: ?Referring Marylin Lathon: ?Treating Glyn Zendejas/Extender: Kalman Shan ?Juluis Mire ?Weeks in Treatment: 17 ?Encounter Discharge  Information Items Post Procedure Vitals ?Discharge Condition: Stable ?Temperature (F): 98.4 ?Ambulatory Status: Wheelchair ?Pulse (bpm): 92 ?Discharge Destination: Home ?Respiratory Rate (breaths/min): 18 ?Transportation: Private Auto ?Blood Pressure (mmHg): 109/78 ?Accompanied By: mother ?Schedule Follow-up Appointment: Yes ?Clinical Summary of Care: ?Electronic Signature(s) ?Signed: 05/21/2021 5:20:03 PM By: [name] RN, BSN ?Entered By: [name] on 05/21/2021 10:29:03 ?-------------------------------------------------------------------------------- ?Lower Extremity Assessment Details ?Patient Name: ?Date of Service: ?[name], [name] 05/21/2021 9:30 A M ?Medical Record Number: [id number] ?Patient Account Number: [id number] ?Date of Birth/Sex: ?Treating RN: ?August 28, 1980 (41 y.o. [name] ?Primary Care Zadin Lange: Juluis Mire ?Other Clinician: ?Referring Chessica Audia: ?Treating Apollos Tenbrink/Extender: Kalman Shan ?Juluis Mire ?Weeks in Treatment: 17 ?Edema Assessment ?Assessed: [Left: No] [Right: Yes] ?Edema: [Left: N] [Right: o] ?Calf ?Left: Right: ?Point of Measurement: 34 cm From Medial Instep 45 cm ?Ankle ?Left: Right: ?Point of Measurement: 8 cm From Medial Instep 25 cm ?Vascular Assessment ?Pulses: ?Dorsalis Pedis ?Palpable: [Right:Yes] ?Electronic Signature(s) ?Signed: 05/21/2021 10:25:41 AM By: Lorrin Jackson ?Entered By: Lorrin Jackson on 05/21/2021 10:05:31 ?-------------------------------------------------------------------------------- ?Multi Wound Chart Details ?Patient Name: ?Date of Service: ?[name], [name] 05/21/2021 9:30 A M ?Medical Record Number: [id number] ?Patient Account Number: [id number] ?Date of Birth/Sex: ?Treating RN: ?Aug 28, 1980 (41 y.o. [name] ?Primary Care Kandiss Ihrig: Juluis Mire ?Other Clinician: ?Referring Agueda Houpt: ?Treating Krisy Dix/Extender: Kalman Shan ?Juluis Mire ?Weeks in Treatment: 17 ?Vital Signs ?Height(in): 69 ?Pulse(bpm):  92 ?Weight(lbs): ?Blood Pressure(mmHg): 109/78 ?Body Mass Index(BMI): ?Temperature(??F): 98.4 ?Respiratory Rate(breaths/min): 18 ?Photos: [1:Right Amputation Site -] [N/A:N/A N/A] ?Wound Location: [1:Transmetatarsal Surgical Injury] [N/A:N/A] ?Wounding Event: [1:Diabetic Wound/Ulcer of the Lower] [N/A:N/A] ?Primary Etiology: [1:Extremity Hypertension, Type II Diabetes,] [N/A:N/A] ?Comorbid History: [1:Osteomyelitis, Neuropathy 12/15/2020] [N/A:N/A] ?Date Acquired: [1:17] [N/A:N/A] ?Weeks of Treatment: [1:Open] [N/A:N/A] ?Wound Status: [1:No] [N/A:N/A] ?Wound Recurrence: [1:1x3.6x0.4] [N/A:N/A] ?Measurements L x W x D (cm) [1:2.827] [N/A:N/A] ?A (cm?) : ?rea [1:1.131] [N/A:N/A] ?Volume (cm?) : [1:93.40%] [N/A:N/A] ?% Reduction in A [1:rea: 98.30%] [N/A:N/A] ?% Reduction  in Volume: [1:Grade 3] [N/A:N/A] ?Classification: [1:Medium] [N/A:N/A] ?Exudate A mount: [1:Serosanguineous] [N/A:N/A] ?Exudate Type: [1:red, brown] [N/A:N/A] ?Exudate Color: [1:Thickened] [N/A:N/A] ?Wound Margin: [1:Medium (34-66%)] [N/A:N/A] ?Granulation A mount: [1:Red, Pink] [N/A:N/A] ?Granulation Quality: [1:Medium (34-66%)] [N/A:N/A] ?Necrotic A mount: ?[1:Fat Layer (Subcutaneous Tissue): Yes N/A] ?Exposed Structures: ?[1:Fascia: No Tendon: No Muscle: No Joint: No Bone: No Large (67-100%)] [N/A:N/A] ?Epithelialization: [1:Debridement - Excisional] [N/A:N/A] ?Debridement: ?Pre-procedure Verification/Time Out 10:20 [N/A:N/A] ?Taken: [1:Lidocaine 5% topical ointment] [N/A:N/A] ?Pain Control: [1:Subcutaneous, Slough] [N/A:N/A] ?Tissue Debrided: [1:Skin/Subcutaneous Tissue] [N/A:N/A] ?Level: [1:3.6] [N/A:N/A] ?Debridement A (sq cm): [1:rea Curette] [N/A:N/A] ?Instrument: [1:Minimum] [N/A:N/A] ?Bleeding: [1:Pressure] [N/A:N/A] ?Hemostasis A chieved: [1:0] [N/A:N/A] ?Procedural Pain: [1:0] [N/A:N/A] ?Post Procedural Pain: [1:Procedure was tolerated well] [N/A:N/A] ?Debridement Treatment Response: [1:1x3.6x0.4] [N/A:N/A] ?Post Debridement Measurements L  x ?W x D (cm) [1:1.131] [N/A:N/A] ?Post Debridement Volume: (cm?) [1:Maceration Noted] [N/A:N/A] ?Assessment Notes: [1:Debridement] [N/A:N/A] ?Treatment Notes ?Wound #1 (Amputation Site - Transmetatarsal) Wound Laterality: Right ?Cleanser ?Normal Saline ?Discharge Instruction: Cleanse the wound with Normal Saline prior to applying a clean dressing using gauze sponges, not tissue or cotton balls. ?Peri-Wound Care ?Topical ?Primary Dressing ?Promogran Prisma Matrix, 4.34 (sq in) (silver collagen) ?Discharge Instruction: Moisten collagen with hydrogel or KY Jelly ?Secondary Dressing ?ABD Pad, 5x9 ?Discharge Instruction: Apply over primary dressing as directed. ?Woven Gauze Sponge, Non-Sterile 4x4 in ?Discharge Instruction: Apply over primary dressing as directed. ?Secured With ?Elastic Bandage 4 inch (ACE bandage) ?Discharge Instruction: Secure with ACE bandage as directed. ?Kerlix Roll Sterile, 4.5x3.1 (in/yd) ?Discharge Instruction: Secure with Kerlix as directed. ?38M Medipore H Soft Cloth Surgical T ape, 4 x 10 (in/yd) ?Discharge Instruction: Secure with tape as directed. ?Compression Wrap ?Compression Stockings ?Add-Ons ?Electronic Signature(s) ?Signed: 05/21/2021 11:06:06 AM By: Kalman Shan DO ?Signed: 05/23/2021 7:19:37 PM By: Lorrin Jackson ?Entered By: Kalman Shan on 05/21/2021 10:31:58 ?-------------------------------------------------------------------------------- ?Multi-Disciplinary Care Plan Details ?Patient Name: ?Date of Service: ?[name], [name] 05/21/2021 9:30 A M ?Medical Record Number: [id number] ?Patient Account Number: [id number] ?Date of Birth/Sex: ?Treating RN: ?[birth year] (41 y.o. [name] ?Primary Care Ellean Firman: Juluis Mire ?Other Clinician: ?Referring Seraj Dunnam: ?Treating Annalissa Murphey/Extender: Kalman Shan ?Juluis Mire ?Weeks in Treatment: 17 ?Active Inactive ?Nutrition ?Nursing Diagnoses: ?Impaired glucose control: actual or potential ?Goals: ?Patient/caregiver  verbalizes understanding of need to maintain therapeutic glucose control per primary care physician ?Date Initiated: 01/22/2021 ?Target Resolution Date: 06/04/2021 ?Goal Status: Active ?Interventions: ?Assess HgA1c r

## 2021-05-23 NOTE — Progress Notes (Signed)
Felten, Jolanda L. ([id number]) ?Visit Report for 05/21/2021 ?Chief Complaint Document Details ?Patient Name: Date of Service: ?[name], [name] 05/21/2021 9:30 A M ?Medical Record Number: [id number] ?Patient Account Number: [id number] ?Date of Birth/Sex: Treating RN: ?01-01-1981 (41 y.o. [name] ?Primary Care Provider: Juluis Mire Other Clinician: ?Referring Provider: ?Treating Provider/Extender: Kalman Shan ?Juluis Mire ?Weeks in Treatment: 17 ?Information Obtained from: Patient ?Chief Complaint ?Osteomyelitis of the right foot status post transmetatarsal amputation with surgical site dehiscence ?Electronic Signature(s) ?Signed: 05/21/2021 11:06:06 AM By: Kalman Shan DO ?Entered By: Kalman Shan on 05/21/2021 10:32:08 ?-------------------------------------------------------------------------------- ?Debridement Details ?Patient Name: Date of Service: ?[name], [name] 05/21/2021 9:30 A M ?Medical Record Number: [id number] ?Patient Account Number: [id number] ?Date of Birth/Sex: Treating RN: ?1980/04/25 (41 y.o. F) [name], [name] ?Primary Care Provider: Juluis Mire Other Clinician: ?Referring Provider: ?Treating Provider/Extender: Kalman Shan ?Juluis Mire ?Weeks in Treatment: 17 ?Debridement Performed for Assessment: Wound #1 Right Amputation Site - Transmetatarsal ?Performed By: Physician Kalman Shan, DO ?Debridement Type: Debridement ?Severity of Tissue Pre Debridement: Fat layer exposed ?Level of Consciousness (Pre-procedure): Awake and Alert ?Pre-procedure Verification/Time Out Yes - 10:20 ?Taken: ?Start Time: 10:21 ?Pain Control: Lidocaine 5% topical ointment ?T Area Debrided (L x W): ?otal 1 (cm) x 3.6 (cm) = 3.6 (cm?) ?Tissue and other material debrided: ?Viable, Non-Viable, Slough, Subcutaneous, Skin: Dermis , Skin: Epidermis, Fibrin/Exudate, Slough ?Level: Skin/Subcutaneous Tissue ?Debridement Description: Excisional ?Instrument: Curette ?Bleeding:  Minimum ?Hemostasis Achieved: Pressure ?End Time: 10:24 ?Procedural Pain: 0 ?Post Procedural Pain: 0 ?Response to Treatment: Procedure was tolerated well ?Level of Consciousness (Post- Awake and Alert ?procedure): ?Post Debridement Measurements of Total Wound ?Length: (cm) 1 ?Width: (cm) 3.6 ?Depth: (cm) 0.4 ?Volume: (cm?) 1.131 ?Character of Wound/Ulcer Post Debridement: Improved ?Severity of Tissue Post Debridement: Fat layer exposed ?Post Procedure Diagnosis ?Same as Pre-procedure ?Electronic Signature(s) ?Signed: 05/21/2021 11:06:06 AM By: Kalman Shan DO ?Signed: 05/21/2021 5:20:03 PM By: [name] RN, BSN ?Entered By: [name] on 05/21/2021 10:24:46 ?-------------------------------------------------------------------------------- ?HPI Details ?Patient Name: Date of Service: ?[name], [name] 05/21/2021 9:30 A M ?Medical Record Number: [id number] ?Patient Account Number: [id number] ?Date of Birth/Sex: Treating RN: ?1980-04-01 (41 y.o. [name] ?Primary Care Provider: Juluis Mire Other Clinician: ?Referring Provider: ?Treating Provider/Extender: Kalman Shan ?Juluis Mire ?Weeks in Treatment: 17 ?History of Present Illness ?HPI Description: Admission 01/22/2021 ?Ms. Peytyn Petrucelli is a 41 year old female with a past medical history of insulin-dependent uncontrolled type 2 diabetes with last hemoglobin A1c of 13.5, ?osteomyelitis of the right foot status post transmetatarsal amputation on 12/18/2020 that presents to the clinic for right foot wound. She has had dehiscence of ?the surgical site. She is currently using wet-to-dry dressings. She has a PICC line and receiving IV ceftriaxone daily for her osteomyelitis. There is an end date ?of 01/27/2021. She is also taking oral metronidazole. She currently denies systemic signs of infection. ?1/19; patient presents for follow-up. She was diagnosed with a DVT to the right lower extremity 2 days ago. She is on Eliquis now. She is scheduled to  see her ?infectious disease doctor tomorrow. She has been using Dakin's wet-to-dry dressings. She denies systemic signs of infection. ?1/26; patient presents for follow-up. She saw infectious disease on 1/21 started on Augmentin. Her PICC line and IV ceftriaxone was discontinued. Patient ?reports stability to her wound. She has been using Dakin's wet-to-dry dressings. She currently denies systemic signs of infection. ?2/3; patient presents for follow-up. She continues to use Dakin's wet-to-dry dressings to  the wound bed. She saw Dr. Yvette Rack with infectious disease ?yesterday and is continuing Augmentin. T entative end date is 2/16. Patient reports following up with orthopedics. She states there is no further plan from them. ?She currently denies systemic signs of infection. ?2/10; patient presents for follow-up. She continues to use Dakin's wet to dry dressings. She is scheduled to have her MRI done on 2/14. She states that she ?had pain to the debridement site from last clinic visit and declines debridement today. She denies systemic signs of infection. She continues to have yellow ?thick drainage. ?2/20; patient presents for follow-up. She continues to use Dakin's wet-to-dry dressings. She obtained her MRI. The results showed an abscess and she is ?scheduled to see her orthopedic surgeon on 2/23. She saw infectious disease 2/17 and her antibiotics were extended. She currently denies systemic signs of ?infection. ?3/6; patient presents for follow-up. She had debridement and irrigation of her foot on 03/10/2021 due to abscess noted on MRI. She was started on IV ?ceftriaxone and oral Flagyl. She has no issues or complaints today. She has been using iodoform packing to the tunnel and Dakin's wet-to-dry to the opening. ?03/26/2021: She continues on IV ceftriaxone and oral metronidazole. She has follow-up with infectious disease tomorrow. No significant issues or complaints ?today. Her mother continues to help her with  her wound dressing, using iodoform packing strips into the tunnel and Dakin's to the open portion of the wound. ?3/20; patient presents for follow-up. She continues to be on IV ceftriaxone in oral metronidazole. She has been using iodoform to the tunnel and Dakin's wet-to- ?dry to the open wound. She denies signs of infection. ?3/27; patient presents for follow-up. She no longer has a PICC line. She has been using iodoform to the tunnel and Dakin's wet-to-dry to the open wound. She ?reports improvement in wound healing. She denies signs of infection. ?4/3; patient presents for follow-up. She states she has been using Hydrofera Blue to the open wound and iodoform packing to the tunnel without any issues. ?She denies signs of infection. ?4/18; patient presents for follow-up. She saw infectious disease on 4/11. She has finished her oral antibiotics and completed a total of 6 weeks of antibiotics ?(this includes IV as well). No further antibiotics needed. She has been using Hydrofera Blue and iodoform packing. She states that the tunneled area has come ?in and the iodoform is not staying in place anymore. She has no issues or complaints today. She denies signs of infection. ?4/24; patient presents for follow-up. She saw Dr. Despina Pole, plastic surgery to discuss potential skin graft/substitute placement. At this time he thinks that the ?skin graft would likely not take. He is in agreement with trying a wound VAC. Patient has been using Hydrofera Blue dressing changes with no issues. She ?denies signs of infection. She reports improvement in wound healing. ?5/1; patient presents for follow-up. Unfortunately patient did not have insurance when we ran for the pico. There is an assistance program and we are trying to ?get this accommodated for the patient. In the meantime she has been using Hydrofera Blue without any issues. She denies signs of infection. ?5/8; patient presents for follow-up. We have not heard back if pico is  covered by her insurance. She has been using collagen to the wound bed over the past ?week. She denies signs of infection. ?Electronic Signature(s) ?Signed: 05/21/2021 11:06:06 AM By: Kalman Shan DO ?Entered By:

## 2021-05-31 ENCOUNTER — Other Ambulatory Visit: Payer: Self-pay

## 2021-05-31 ENCOUNTER — Encounter (HOSPITAL_BASED_OUTPATIENT_CLINIC_OR_DEPARTMENT_OTHER): Payer: Medicaid Other | Admitting: Internal Medicine

## 2021-05-31 DIAGNOSIS — L97514 Non-pressure chronic ulcer of other part of right foot with necrosis of bone: Secondary | ICD-10-CM

## 2021-06-01 ENCOUNTER — Ambulatory Visit: Payer: Self-pay | Admitting: Plastic Surgery

## 2021-06-01 NOTE — Progress Notes (Signed)
[name], [name] ([id number]) Visit Report for 05/31/2021 Arrival Information Details Patient Name: Date of Service: [name], [name] [name] 05/31/2021 9:30 A M Medical Record Number: [id number] Patient Account Number: [id number] Date of Birth/Sex: Treating RN: 04-03-1980 (41 y.o. [name] Primary Care [name]: [name] Other Clinician: Referring [name]: Treating [name]/Extender: [name] in Treatment: 18 Visit Information History Since Last Visit Added or deleted any medications: No Patient Arrived: Wheel Chair Any new allergies or adverse reactions: No Arrival Time: 09:55 Had a fall or experienced change in No Transfer Assistance: None activities of daily living that may affect Patient Identification Verified: Yes risk of falls: Secondary Verification Process Completed: Yes Signs or symptoms of abuse/neglect since No Patient Requires Transmission-Based No last visito Precautions: Hospitalized since last visit: No Patient Has Alerts: Yes Implantable device outside of the clinic No Patient Alerts: Patient on Blood Thinner excluding PICC R Arm cellular tissue based products placed in the ABI 12/17/20 R=1.08 center L=1.13 since last visit: Has Dressing in Place as Prescribed: Yes Has Footwear/Offloading in Place as Yes Prescribed: Right: Surgical Shoe with Pressure Relief Insole Pain Present Now: No Electronic Signature(s) Signed: 05/31/2021 6:05:14 PM By: [name] Entered By: [name] on 05/31/2021 09:56:16 -------------------------------------------------------------------------------- Complex / Palliative Patient Assessment Details Patient Name: Date of Service: [name], [name] 05/31/2021 9:30 A M Medical Record Number: [id number] Patient Account Number: [id number] Date of Birth/Sex: Treating RN: Sep 22, 1980 (41 y.o. [name] Primary Care Tyleah Loh: [name] Other Clinician: Referring  [name]: Treating [name]/Extender: [name] in Treatment: 18 Complex Wound Management Criteria Patient has remarkable or complex co-morbidities requiring medications or treatments that extend wound healing times. Examples: Diabetes mellitus with chronic renal failure or end stage renal disease requiring dialysis Advanced or poorly controlled rheumatoid arthritis Diabetes mellitus and end stage chronic obstructive pulmonary disease Active cancer with current chemo- or radiation therapy Diabetes, Infection, Osteomyelitis Palliative Wound Management Criteria Care Approach Wound Care Plan: Complex Wound Management Electronic Signature(s) Signed: 05/31/2021 10:50:04 AM By: [name] Signed: 05/31/2021 11:01:51 AM By: [name] Entered By: [name] on 05/31/2021 10:50:04 -------------------------------------------------------------------------------- Encounter Discharge Information Details Patient Name: Date of Service: [name], [name] 05/31/2021 9:30 A M Medical Record Number: [id number] Patient Account Number: [id number] Date of Birth/Sex: Treating RN: 1980-09-19 (41 y.o. [name] Primary Care [name]: [name] Other Clinician: Referring Travian Kerner: Treating [name]/Extender: [name] in Treatment: 18 Encounter Discharge Information Items Post Procedure Vitals Discharge Condition: Stable Temperature (F): 98.2 Ambulatory Status: Wheelchair Pulse (bpm): 82 Discharge Destination: Home Respiratory Rate (breaths/min): 18 Transportation: Private Auto Blood Pressure (mmHg): 116/81 Schedule Follow-up Appointment: Yes Clinical Summary of Care: Provided on 05/31/2021 Form Type Recipient Paper Patient Patient Electronic Signature(s) Signed: 05/31/2021 6:05:14 PM By: [name] Entered By: [name] on 05/31/2021  10:23:53 -------------------------------------------------------------------------------- Lower Extremity Assessment Details Patient Name: Date of Service: [name], [name] 05/31/2021 9:30 A M Medical Record Number: [id number] Patient Account Number: [id number] Date of Birth/Sex: Treating RN: 1980/11/25 (41 y.o. [name] Primary Care [name]: [name] Other Clinician: Referring Marten [name]: Treating [name] in Treatment: 18 Edema Assessment Assessed: [Left: No] [Right: Yes] Edema: [Left: N] [Right: o] Calf Left: Right: Point of Measurement: 34 cm From Medial Instep 45 cm Ankle Left: Right: Point of Measurement: 8 cm From Medial Instep 25 cm Vascular Assessment Pulses: Dorsalis Pedis Palpable: [Right:Yes]  Electronic Signature(s) Signed: 05/31/2021 6:05:14 PM By: [name] Entered By: [name] on 05/31/2021 10:02:32 -------------------------------------------------------------------------------- Multi Wound Chart Details Patient Name: Date of Service: [name], [name] 05/31/2021 9:30 A M Medical Record Number: [id number] Patient Account Number: [id number] Date of Birth/Sex: Treating RN: [birth year] (41 y.o. [name] Primary Care Lamya Lausch: [name] Other Clinician: Referring [name]: Treating [name]/Extender: [name] in Treatment: 18 Vital Signs Height(in): 69 Pulse(bpm): 22 Weight(lbs): Blood Pressure(mmHg): 116/81 Body Mass Index(BMI): Temperature(F): 98.2 Respiratory Rate(breaths/min): 18 Photos: [N/A:N/A] Right Amputation Site - N/A N/A Wound Location: Transmetatarsal Surgical Injury N/A N/A Wounding Event: Diabetic Wound/Ulcer of the Lower N/A N/A Primary Etiology: Extremity Hypertension, Type II Diabetes, N/A N/A Comorbid History: Osteomyelitis, Neuropathy 12/15/2020 N/A N/A Date Acquired: 18 N/A N/A [name] of  Treatment: Open N/A N/A Wound Status: No N/A N/A Wound Recurrence: 0.9x3.5x0.4 N/A N/A Measurements L x W x [name] (cm) 2.474 N/A N/A A (cm) : rea 0.99 N/A N/A Volume (cm) : 94.20% N/A N/A % Reduction in A rea: 98.50% N/A N/A % Reduction in Volume: Grade 3 N/A N/A Classification: Medium N/A N/A Exudate A mount: Serosanguineous N/A N/A Exudate Type: red, brown N/A N/A Exudate Color: Thickened N/A N/A Wound Margin: Medium (34-66%) N/A N/A Granulation A mount: Red, Pink N/A N/A Granulation Quality: Medium (34-66%) N/A N/A Necrotic A mount: Fat Layer (Subcutaneous Tissue): Yes N/A N/A Exposed Structures: Fascia: No Tendon: No Muscle: No Joint: No Bone: No Large (67-100%) N/A N/A Epithelialization: Debridement - Excisional N/A N/A Debridement: Pre-procedure Verification/Time Out 10:14 N/A N/A Taken: Other N/A N/A Pain Control: Subcutaneous, Slough N/A N/A Tissue Debrided: Skin/Subcutaneous Tissue N/A N/A Level: 3.15 N/A N/A Debridement A (sq cm): rea Curette N/A N/A Instrument: Minimum N/A N/A Bleeding: Pressure N/A N/A Hemostasis Achieved: Procedure was tolerated well N/A N/A Debridement Treatment Response: 0.9x3.5x0.4 N/A N/A Post Debridement Measurements L x W x [name] (cm) 0.99 N/A N/A Post Debridement Volume: (cm) Callous noted N/A N/A Assessment Notes: Debridement N/A N/A Procedures Performed: Treatment Notes Wound #1 (Amputation Site - Transmetatarsal) Wound Laterality: Right Cleanser Normal Saline Discharge Instruction: Cleanse the wound with Normal Saline prior to applying a clean dressing using gauze sponges, not tissue or cotton balls. Peri-Wound Care Topical Primary Dressing Promogran Prisma Matrix, 4.34 (sq in) (silver collagen) Discharge Instruction: Moisten collagen with hydrogel or KY Jelly Secondary Dressing ABD Pad, 5x9 Discharge Instruction: Apply over primary dressing as directed. Woven Gauze Sponge, Non-Sterile 4x4  in Discharge Instruction: Apply over primary dressing as directed. Secured With Elastic Bandage 4 inch (ACE bandage) Discharge Instruction: Secure with ACE bandage as directed. Kerlix Roll Sterile, 4.5x3.1 (in/yd) Discharge Instruction: Secure with Kerlix as directed. 2M Medipore H Soft Cloth Surgical T ape, 4 x 10 (in/yd) Discharge Instruction: Secure with tape as directed. Compression Wrap Compression Stockings Add-Ons Electronic Signature(s) Signed: 05/31/2021 11:01:51 AM By: [name] Signed: 05/31/2021 6:05:14 PM By: [name] Entered By: [name] Shan on 05/31/2021 10:54:36 -------------------------------------------------------------------------------- Multi-Disciplinary Care Plan Details Patient Name: Date of Service: [name], [name] 05/31/2021 9:30 A M Medical Record Number: [id number] Patient Account Number: [id number] Date of Birth/Sex: Treating RN: [birth year] (41 y.o. [name] Primary Care Fitzhugh Vizcarrondo: [name] Other Clinician: Referring Makinsey Pepitone: Treating Andris Brothers/Extender: [name] in Treatment: 18 Active Inactive Nutrition Nursing Diagnoses: Impaired glucose control: actual or potential Goals: Patient/caregiver verbalizes understanding of need to maintain therapeutic glucose control per primary care physician Date Initiated: 01/22/2021 Target Resolution  Date: 06/04/2021 Goal Status: Active Interventions: Assess HgA1c results as ordered upon admission and as needed Provide education on elevated blood sugars and impact on wound healing Treatment Activities: Obtain HgA1c : 01/22/2021 Notes: Wound/Skin Impairment Nursing Diagnoses: Impaired tissue integrity Goals: Patient/caregiver will verbalize understanding of skin care regimen Date Initiated: 01/22/2021 Target Resolution Date: 06/04/2021 Goal Status: Active Ulcer/skin breakdown will have a volume reduction of 30% by week 4 Date Initiated:  01/22/2021 Date Inactivated: 03/19/2021 Target Resolution Date: 03/16/2021 Goal Status: Met Ulcer/skin breakdown will have a volume reduction of 50% by week 8 Date Initiated: 03/19/2021 Date Inactivated: 05/14/2021 Target Resolution Date: 04/16/2021 Unmet Reason: see wound Goal Status: Unmet measurements Interventions: Assess patient/caregiver ability to obtain necessary supplies Assess patient/caregiver ability to perform ulcer/skin care regimen upon admission and as needed Assess ulceration(s) every visit Provide education on ulcer and skin care Treatment Activities: Topical wound management initiated : 01/22/2021 Notes: Electronic Signature(s) Signed: 05/31/2021 6:05:14 PM By: [name] Entered By: [name] on 05/31/2021 09:55:31 -------------------------------------------------------------------------------- Pain Assessment Details Patient Name: Date of Service: DA [name] IS, [name] 05/31/2021 9:30 A M Medical Record Number: [id number] Patient Account Number: [id number] Date of Birth/Sex: Treating RN: 11-25-1980 (41 y.o. [name] Primary Care Jasmarie Coppock: [name] Other Clinician: Referring Ray Glacken: Treating Rylin Seavey/Extender: [name] in Treatment: 18 Active Problems Location of Pain Severity and Description of Pain Patient Has Paino No Site Locations Pain Management and Medication Current Pain Management: Electronic Signature(s) Signed: 05/31/2021 6:05:14 PM By: [name] Entered By: [name] on 05/31/2021 09:56:55 -------------------------------------------------------------------------------- Patient/Caregiver Education Details Patient Name: Date of Service: DA [name], [name] 5/18/2023andnbsp9:30 [address] [name] Record Number: [id number] Patient Account Number: [id number] Date of Birth/Gender: Treating RN: [birth year] (41 y.o. [name] Primary Care Physician: [name] Other  Clinician: Referring Physician: Treating Physician/Extender: [name] in Treatment: 18 Education Assessment Education Provided To: Patient Education Topics Provided Elevated Blood Sugar/ Impact on Healing: Methods: Explain/Verbal, Printed Responses: State content correctly Wound/Skin Impairment: Methods: Demonstration, Explain/Verbal, Printed Responses: State content correctly Electronic Signature(s) Signed: 05/31/2021 6:05:14 PM By: [name] Entered By: [name] on 05/31/2021 09:55:50 -------------------------------------------------------------------------------- Wound Assessment Details Patient Name: Date of Service: [name], [name] 05/31/2021 9:30 A M Medical Record Number: [id number] Patient Account Number: [id number] Date of Birth/Sex: Treating RN: October 20, 1980 (41 y.o. [name] Primary Care Farzad Tibbetts: [name] Other Clinician: Referring Travell Desaulniers: Treating Krystel Fletchall/Extender: [name] in Treatment: 18 Wound Status Wound Number: 1 Primary Etiology: Diabetic Wound/Ulcer of the Lower Extremity Wound Location: Right Amputation Site - Transmetatarsal Wound Status: Open Wounding Event: Surgical Injury Comorbid Hypertension, Type II Diabetes, Osteomyelitis, History: Neuropathy Date Acquired: 12/15/2020 [name] Of Treatment: 18 Clustered Wound: No Photos Wound Measurements Length: (cm) 0.9 Width: (cm) 3.5 Depth: (cm) 0.4 Area: (cm) 2.474 Volume: (cm) 0.99 % Reduction in Area: 94.2% % Reduction in Volume: 98.5% Epithelialization: Large (67-100%) Tunneling: No Undermining: No Wound Description Classification: Grade 3 Wound Margin: Thickened Exudate Amount: Medium Exudate Type: Serosanguineous Exudate Color: red, brown Foul Odor After Cleansing: No Slough/Fibrino Yes Wound Bed Granulation Amount: Medium (34-66%) Exposed Structure Granulation Quality: Red, Pink Fascia  Exposed: No Necrotic Amount: Medium (34-66%) Fat Layer (Subcutaneous Tissue) Exposed: Yes Necrotic Quality: Adherent Slough Tendon Exposed: No Muscle Exposed: No Joint Exposed: No Bone Exposed: No Assessment Notes Callous noted Treatment Notes Wound #1 (Amputation Site - Transmetatarsal) Wound Laterality: Right Cleanser Normal Saline Discharge Instruction: Cleanse the  wound with Normal Saline prior to applying a clean dressing using gauze sponges, not tissue or cotton balls. Peri-Wound Care Topical Primary Dressing Promogran Prisma Matrix, 4.34 (sq in) (silver collagen) Discharge Instruction: Moisten collagen with hydrogel or KY Jelly Secondary Dressing ABD Pad, 5x9 Discharge Instruction: Apply over primary dressing as directed. Woven Gauze Sponge, Non-Sterile 4x4 in Discharge Instruction: Apply over primary dressing as directed. Secured With Elastic Bandage 4 inch (ACE bandage) Discharge Instruction: Secure with ACE bandage as directed. Kerlix Roll Sterile, 4.5x3.1 (in/yd) Discharge Instruction: Secure with Kerlix as directed. 41M Medipore H Soft Cloth Surgical T ape, 4 x 10 (in/yd) Discharge Instruction: Secure with tape as directed. Compression Wrap Compression Stockings Add-Ons Electronic Signature(s) Signed: 05/31/2021 6:05:14 PM By: [name] Entered By: [name] on 05/31/2021 10:03:50 -------------------------------------------------------------------------------- Vitals Details Patient Name: Date of Service: [name], [name] 05/31/2021 9:30 A M Medical Record Number: [id number] Patient Account Number: [id number] Date of Birth/Sex: Treating RN: [birth year] (41 y.o. [name] Primary Care Isabelly Kobler: [name] Other Clinician: Referring Ramah Langhans: Treating Darian Ace/Extender: [name] in Treatment: 18 Vital Signs Time Taken: 09:56 Temperature (F): 98.2 Height (in): 69 Pulse (bpm): 82 Respiratory Rate  (breaths/min): 18 Blood Pressure (mmHg): 116/81 Reference Range: 80 - 120 mg / dl Notes Patient has checked blood sugar Electronic Signature(s) Signed: 05/31/2021 6:05:14 PM By: [name] Entered By: [name] on 05/31/2021 09:56:49

## 2021-06-01 NOTE — Progress Notes (Signed)
Halder, Keyshawna L. ([id number]) Visit Report for 05/31/2021 Chief Complaint Document Details Patient Name: Date of Service: [name] IS, [place] [name] 05/31/2021 9:30 A M Medical Record Number: [id number] Patient Account Number: [id number] Date of Birth/Sex: Treating RN: 03/14/1980 (41 y.o. [name] Primary Care Provider: Gwinda Passe Other Clinician: Referring Provider: Treating Provider/Extender: Grace Isaac in Treatment: 18 Information Obtained from: Patient Chief Complaint Osteomyelitis of the right foot status post transmetatarsal amputation with surgical site dehiscence Electronic Signature(s) Signed: 05/31/2021 11:01:51 AM By: Geralyn Corwin DO Entered By: Geralyn Corwin on 05/31/2021 10:54:43 -------------------------------------------------------------------------------- Debridement Details Patient Name: Date of Service: [name], [name] 05/31/2021 9:30 A M Medical Record Number: [id number] Patient Account Number: [id number] Date of Birth/Sex: Treating RN: November 20, 1980 (41 y.o. [name] Primary Care Provider: Gwinda Passe Other Clinician: Referring Provider: Treating Provider/Extender: Grace Isaac in Treatment: 18 Debridement Performed for Assessment: Wound #1 Right Amputation Site - Transmetatarsal Performed By: Physician Geralyn Corwin, DO Debridement Type: Debridement Severity of Tissue Pre Debridement: Fat layer exposed Level of Consciousness (Pre-procedure): Awake and Alert Pre-procedure Verification/Time Out Yes - 10:14 Taken: Start Time: 10:15 Pain Control: Other : Benzocaine T Area Debrided (L x W): otal 0.9 (cm) x 3.5 (cm) = 3.15 (cm) Tissue and other material debrided: Non-Viable, Slough, Subcutaneous, Slough Level: Skin/Subcutaneous Tissue Debridement Description: Excisional Instrument: Curette Bleeding: Minimum Hemostasis Achieved: Pressure Response to Treatment: Procedure  was tolerated well Level of Consciousness (Post- Awake and Alert procedure): Post Debridement Measurements of Total Wound Length: (cm) 0.9 Width: (cm) 3.5 Depth: (cm) 0.4 Volume: (cm) 0.99 Character of Wound/Ulcer Post Debridement: Stable Severity of Tissue Post Debridement: Fat layer exposed Post Procedure Diagnosis Same as Pre-procedure Electronic Signature(s) Signed: 05/31/2021 11:01:51 AM By: Geralyn Corwin DO Signed: 05/31/2021 6:05:14 PM By: Antonieta Iba Entered By: Antonieta Iba on 05/31/2021 10:20:31 -------------------------------------------------------------------------------- HPI Details Patient Name: Date of Service: [name], [name] 05/31/2021 9:30 A M Medical Record Number: [id number] Patient Account Number: [id number] Date of Birth/Sex: Treating RN: 12/05/1980 (41 y.o. [name] Primary Care Provider: Gwinda Passe Other Clinician: Referring Provider: Treating Provider/Extender: Grace Isaac in Treatment: 18 History of Present Illness HPI Description: Admission 01/22/2021 [name] is a 41 year old female with a past medical history of insulin-dependent uncontrolled type 2 diabetes with last hemoglobin A1c of 13.5, osteomyelitis of the right foot status post transmetatarsal amputation on 12/18/2020 that presents to the clinic for right foot wound. She has had dehiscence of the surgical site. She is currently using wet-to-dry dressings. She has a PICC line and receiving IV ceftriaxone daily for her osteomyelitis. There is an end date of 01/27/2021. She is also taking oral metronidazole. She currently denies systemic signs of infection. 1/19; patient presents for follow-up. She was diagnosed with a DVT to the right lower extremity 2 days ago. She is on Eliquis now. She is scheduled to see her infectious disease doctor tomorrow. She has been using Dakin's wet-to-dry dressings. She denies systemic signs of infection. 1/26;  patient presents for follow-up. She saw infectious disease on 1/21 started on Augmentin. Her PICC line and IV ceftriaxone was discontinued. Patient reports stability to her wound. She has been using Dakin's wet-to-dry dressings. She currently denies systemic signs of infection. 2/3; patient presents for follow-up. She continues to use Dakin's wet-to-dry dressings to the wound bed. She saw Dr. Manson Passey with infectious disease yesterday and is continuing Augmentin. T entative end date is  2/16. Patient reports following up with orthopedics. She states there is no further plan from them. She currently denies systemic signs of infection. 2/10; patient presents for follow-up. She continues to use Dakin's wet to dry dressings. She is scheduled to have her MRI done on 2/14. She states that she had pain to the debridement site from last clinic visit and declines debridement today. She denies systemic signs of infection. She continues to have yellow thick drainage. 2/20; patient presents for follow-up. She continues to use Dakin's wet-to-dry dressings. She obtained her MRI. The results showed an abscess and she is scheduled to see her orthopedic surgeon on 2/23. She saw infectious disease 2/17 and her antibiotics were extended. She currently denies systemic signs of infection. 3/6; patient presents for follow-up. She had debridement and irrigation of her foot on 03/10/2021 due to abscess noted on MRI. She was started on IV ceftriaxone and oral Flagyl. She has no issues or complaints today. She has been using iodoform packing to the tunnel and Dakin's wet-to-dry to the opening. 03/26/2021: She continues on IV ceftriaxone and oral metronidazole. She has follow-up with infectious disease tomorrow. No significant issues or complaints today. Her mother continues to help her with her wound dressing, using iodoform packing strips into the tunnel and Dakin's to the open portion of the wound. 3/20; patient presents for  follow-up. She continues to be on IV ceftriaxone in oral metronidazole. She has been using iodoform to the tunnel and Dakin's wet-to- dry to the open wound. She denies signs of infection. 3/27; patient presents for follow-up. She no longer has a PICC line. She has been using iodoform to the tunnel and Dakin's wet-to-dry to the open wound. She reports improvement in wound healing. She denies signs of infection. 4/3; patient presents for follow-up. She states she has been using Hydrofera Blue to the open wound and iodoform packing to the tunnel without any issues. She denies signs of infection. 4/18; patient presents for follow-up. She saw infectious disease on 4/11. She has finished her oral antibiotics and completed a total of 6 weeks of antibiotics (this includes IV as well). No further antibiotics needed. She has been using Hydrofera Blue and iodoform packing. She states that the tunneled area has come in and the iodoform is not staying in place anymore. She has no issues or complaints today. She denies signs of infection. 4/24; patient presents for follow-up. She saw Dr. Carlene Coria, plastic surgery to discuss potential skin graft/substitute placement. At this time he thinks that the skin graft would likely not take. He is in agreement with trying a wound VAC. Patient has been using Hydrofera Blue dressing changes with no issues. She denies signs of infection. She reports improvement in wound healing. 5/1; patient presents for follow-up. Unfortunately patient did not have insurance when we ran for the pico. There is an assistance program and we are trying to get this accommodated for the patient. In the meantime she has been using Hydrofera Blue without any issues. She denies signs of infection. 5/8; patient presents for follow-up. We have not heard back if pico is covered by her insurance. She has been using collagen to the wound bed over the past week. She denies signs of infection. 5/18; patient  presents for follow-up. She has been using collagen to the wound bed without issues. Again we have not heard if pico is covered by her insurance. She has no issues or complaints today. Electronic Signature(s) Signed: 05/31/2021 11:01:51 AM By: Geralyn Corwin DO Entered  By: Geralyn Corwin on 05/31/2021 10:56:10 -------------------------------------------------------------------------------- Physical Exam Details Patient Name: Date of Service: [name], [name] 05/31/2021 9:30 A M Medical Record Number: [id number] Patient Account Number: [id number] Date of Birth/Sex: Treating RN: Jan 03, 1981 (41 y.o. [name] Primary Care Provider: Gwinda Passe Other Clinician: Referring Provider: Treating Provider/Extender: Grace Isaac in Treatment: 18 Constitutional respirations regular, non-labored and within target range for patient.. Cardiovascular 2+ dorsalis pedis/posterior tibialis pulses. Psychiatric pleasant and cooperative. Notes Right foot: Transmetatarsal amputation with wound dehiscence. At the surgical site there is fibrinous tissue with some budding granulation tissue to the medial aspect. Granulation tissue to the rest of the wound bed. No signs of surrounding infection. Electronic Signature(s) Signed: 05/31/2021 11:01:51 AM By: Geralyn Corwin DO Entered By: Geralyn Corwin on 05/31/2021 10:58:09 -------------------------------------------------------------------------------- Physician Orders Details Patient Name: Date of Service: [name], [name] 05/31/2021 9:30 A M Medical Record Number: [id number] Patient Account Number: [id number] Date of Birth/Sex: Treating RN: [birth year] (41 y.o. [name] Primary Care Provider: Gwinda Passe Other Clinician: Referring Provider: Treating Provider/Extender: Grace Isaac in Treatment: [phone] Verbal / Phone Orders: No Diagnosis Coding ICD-10 Coding Code  Description L97.514 Non-pressure chronic ulcer of other part of right foot with necrosis of bone E11.621 Type 2 diabetes mellitus with foot ulcer M86.9 Osteomyelitis, unspecified Follow-up Appointments ppointment in 1 week. - Monday 06/04/2021 10:15 with Dr. Mikey Bussing and Lennox Laity, Room 7. Return A Bathing/ Shower/ Hygiene Other Bathing/Shower/Hygiene Orders/Instructions: - Clean with Saline or Dakins Negative Presssure Wound Therapy Wound Vac to wound continuously at 129mm/hg pressure - Will order vac from [employer] Black Foam Edema Control - Lymphedema / SCD / Other Elevate legs to the level of the heart or above for 30 minutes daily and/or when sitting, a frequency of: - throughout the day Avoid standing for long periods of time. Moisturize legs daily. Off-Loading Open toe surgical shoe to: - May use surgical shoe Additional Orders / Instructions Follow Nutritious Diet - -Monitor/Control Blood Sugar -High Protein Diet Wound Treatment Wound #1 - Amputation Site - Transmetatarsal Wound Laterality: Right Cleanser: Normal Saline (Generic) Every Other Day/30 Days Discharge Instructions: Cleanse the wound with Normal Saline prior to applying a clean dressing using gauze sponges, not tissue or cotton balls. Prim Dressing: Promogran Prisma Matrix, 4.34 (sq in) (silver collagen) Every Other Day/30 Days ary Discharge Instructions: Moisten collagen with hydrogel or KY Jelly Secondary Dressing: ABD Pad, 5x9 (Generic) Every Other Day/30 Days Discharge Instructions: Apply over primary dressing as directed. Secondary Dressing: Woven Gauze Sponge, Non-Sterile 4x4 in (Generic) Every Other Day/30 Days Discharge Instructions: Apply over primary dressing as directed. Secured With: Elastic Bandage 4 inch (ACE bandage) (Generic) Every Other Day/30 Days Discharge Instructions: Secure with ACE bandage as directed. Secured With: [employer], 4.5x3.1 (in/yd) (Generic) Every Other Day/30 Days Discharge  Instructions: Secure with Kerlix as directed. Secured With: 5M Medipore H Soft Cloth Surgical T ape, 4 x 10 (in/yd) (Generic) Every Other Day/30 Days Discharge Instructions: Secure with tape as directed. Electronic Signature(s) Signed: 05/31/2021 11:01:51 AM By: Geralyn Corwin DO Entered By: Geralyn Corwin on 05/31/2021 10:59:02 -------------------------------------------------------------------------------- Problem List Details Patient Name: Date of Service: [name], [name] 05/31/2021 9:30 A M Medical Record Number: [id number] Patient Account Number: [id number] Date of Birth/Sex: Treating RN: [birth year] (41 y.o. [name] Primary Care Provider: Gwinda Passe Other Clinician: Referring Provider: Treating Provider/Extender: Grace Isaac in Treatment: [phone] Active Problems ICD-10  Encounter Code Description Active Date MDM Diagnosis L97.514 Non-pressure chronic ulcer of other part of right foot with necrosis of bone 01/22/2021 No Yes E11.621 Type 2 diabetes mellitus with foot ulcer 01/22/2021 No Yes M86.9 Osteomyelitis, unspecified 01/22/2021 No Yes Inactive Problems Resolved Problems Electronic Signature(s) Signed: 05/31/2021 11:01:51 AM By: Geralyn CorwinHoffman, Nakeitha Milligan DO Entered By: Geralyn CorwinHoffman, Cherrise Occhipinti on 05/31/2021 10:54:30 -------------------------------------------------------------------------------- Progress Note Details Patient Name: Date of Service: [name], [name] 05/31/2021 9:30 A M Medical Record Number: [id number] Patient Account Number: [id number] Date of Birth/Sex: Treating RN: 06/02/1980 (41 y.o. [name] CluckF) Barnhart, Jodi Primary Care Provider: Gwinda PasseEdwards, Michelle Other Clinician: Referring Provider: Treating Provider/Extender: Grace IsaacHoffman, Kimarion Chery Edwards, Michelle Weeks in Treatment: 18 Subjective Chief Complaint Information obtained from Patient Osteomyelitis of the right foot status post transmetatarsal amputation with surgical site  dehiscence History of Present Illness (HPI) Admission 01/22/2021 Ms. Rosey BathMarkia Kersh is a 41 year old female with a past medical history of insulin-dependent uncontrolled type 2 diabetes with last hemoglobin A1c of 13.5, osteomyelitis of the right foot status post transmetatarsal amputation on 12/18/2020 that presents to the clinic for right foot wound. She has had dehiscence of the surgical site. She is currently using wet-to-dry dressings. She has a PICC line and receiving IV ceftriaxone daily for her osteomyelitis. There is an end date of 01/27/2021. She is also taking oral metronidazole. She currently denies systemic signs of infection. 1/19; patient presents for follow-up. She was diagnosed with a DVT to the right lower extremity 2 days ago. She is on Eliquis now. She is scheduled to see her infectious disease doctor tomorrow. She has been using Dakin's wet-to-dry dressings. She denies systemic signs of infection. 1/26; patient presents for follow-up. She saw infectious disease on 1/21 started on Augmentin. Her PICC line and IV ceftriaxone was discontinued. Patient reports stability to her wound. She has been using Dakin's wet-to-dry dressings. She currently denies systemic signs of infection. 2/3; patient presents for follow-up. She continues to use Dakin's wet-to-dry dressings to the wound bed. She saw Dr. Manson PasseyMandahar with infectious disease yesterday and is continuing Augmentin. T entative end date is 2/16. Patient reports following up with orthopedics. She states there is no further plan from them. She currently denies systemic signs of infection. 2/10; patient presents for follow-up. She continues to use Dakin's wet to dry dressings. She is scheduled to have her MRI done on 2/14. She states that she had pain to the debridement site from last clinic visit and declines debridement today. She denies systemic signs of infection. She continues to have yellow thick drainage. 2/20; patient presents for  follow-up. She continues to use Dakin's wet-to-dry dressings. She obtained her MRI. The results showed an abscess and she is scheduled to see her orthopedic surgeon on 2/23. She saw infectious disease 2/17 and her antibiotics were extended. She currently denies systemic signs of infection. 3/6; patient presents for follow-up. She had debridement and irrigation of her foot on 03/10/2021 due to abscess noted on MRI. She was started on IV ceftriaxone and oral Flagyl. She has no issues or complaints today. She has been using iodoform packing to the tunnel and Dakin's wet-to-dry to the opening. 03/26/2021: She continues on IV ceftriaxone and oral metronidazole. She has follow-up with infectious disease tomorrow. No significant issues or complaints today. Her mother continues to help her with her wound dressing, using iodoform packing strips into the tunnel and Dakin's to the open portion of the wound. 3/20; patient presents for follow-up. She continues to be on IV ceftriaxone  in oral metronidazole. She has been using iodoform to the tunnel and Dakin's wet-to- dry to the open wound. She denies signs of infection. 3/27; patient presents for follow-up. She no longer has a PICC line. She has been using iodoform to the tunnel and Dakin's wet-to-dry to the open wound. She reports improvement in wound healing. She denies signs of infection. 4/3; patient presents for follow-up. She states she has been using Hydrofera Blue to the open wound and iodoform packing to the tunnel without any issues. She denies signs of infection. 4/18; patient presents for follow-up. She saw infectious disease on 4/11. She has finished her oral antibiotics and completed a total of 6 weeks of antibiotics (this includes IV as well). No further antibiotics needed. She has been using Hydrofera Blue and iodoform packing. She states that the tunneled area has come in and the iodoform is not staying in place anymore. She has no issues or  complaints today. She denies signs of infection. 4/24; patient presents for follow-up. She saw Dr. Carlene Coria, plastic surgery to discuss potential skin graft/substitute placement. At this time he thinks that the skin graft would likely not take. He is in agreement with trying a wound VAC. Patient has been using Hydrofera Blue dressing changes with no issues. She denies signs of infection. She reports improvement in wound healing. 5/1; patient presents for follow-up. Unfortunately patient did not have insurance when we ran for the pico. There is an assistance program and we are trying to get this accommodated for the patient. In the meantime she has been using Hydrofera Blue without any issues. She denies signs of infection. 5/8; patient presents for follow-up. We have not heard back if pico is covered by her insurance. She has been using collagen to the wound bed over the past week. She denies signs of infection. 5/18; patient presents for follow-up. She has been using collagen to the wound bed without issues. Again we have not heard if pico is covered by her insurance. She has no issues or complaints today. Patient History Information obtained from Patient. Family History Cancer - Paternal Grandparents, Diabetes - Mother, Hypertension - Mother, Stroke - Maternal Grandparents, No family history of Heart Disease, Hereditary Spherocytosis, Kidney Disease, Lung Disease, Seizures, Thyroid Problems, Tuberculosis. Social History Never smoker, Marital Status - Single, Alcohol Use - Rarely, Drug Use - Prior History - Marijuana, Caffeine Use - Daily. Medical History Cardiovascular Patient has history of Hypertension Endocrine Patient has history of Type II Diabetes Musculoskeletal Patient has history of Osteomyelitis - Right Transmet 12/18/20 Neurologic Patient has history of Neuropathy Objective Constitutional respirations regular, non-labored and within target range for patient.. Vitals Time  Taken: 9:56 AM, Height: 69 in, Temperature: 98.2 F, Pulse: 82 bpm, Respiratory Rate: 18 breaths/min, Blood Pressure: 116/81 mmHg. General Notes: Patient has checked blood sugar Cardiovascular 2+ dorsalis pedis/posterior tibialis pulses. Psychiatric pleasant and cooperative. General Notes: Right foot: Transmetatarsal amputation with wound dehiscence. At the surgical site there is fibrinous tissue with some budding granulation tissue to the medial aspect. Granulation tissue to the rest of the wound bed. No signs of surrounding infection. Integumentary (Hair, Skin) Wound #1 status is Open. Original cause of wound was Surgical Injury. The date acquired was: 12/15/2020. The wound has been in treatment 18 weeks. The wound is located on the Right Amputation Site - Transmetatarsal. The wound measures 0.9cm length x 3.5cm width x 0.4cm depth; 2.474cm^2 area and 0.99cm^3 volume. There is Fat Layer (Subcutaneous Tissue) exposed. There is no tunneling or  undermining noted. There is a medium amount of serosanguineous drainage noted. The wound margin is thickened. There is medium (34-66%) red, pink granulation within the wound bed. There is a medium (34- 66%) amount of necrotic tissue within the wound bed including Adherent Slough. General Notes: Callous noted Assessment Active Problems ICD-10 Non-pressure chronic ulcer of other part of right foot with necrosis of bone Type 2 diabetes mellitus with foot ulcer Osteomyelitis, unspecified Patient's wound is stable. I debrided nonviable tissue. No signs of surrounding infection. I recommended continuing collagen. It is unclear whether the patient will be able to obtain pico. I think at this time we will try to obtain a regular wound VAC through a charity program through [employer]. [facility] Follow-up in 1 week. Procedures Wound #1 Pre-procedure diagnosis of Wound #1 is a Diabetic Wound/Ulcer of the Lower Extremity located on the  Right Amputation Site - Transmetatarsal .Severity of Tissue Pre Debridement is: Fat layer exposed. There was a Excisional Skin/Subcutaneous Tissue Debridement with a total area of 3.15 sq cm performed by Geralyn Corwin, DO. With the following instrument(s): Curette to remove Non-Viable tissue/material. Material removed includes Subcutaneous Tissue and Slough and after achieving pain control using Other (Benzocaine). No specimens were taken. A time out was conducted at 10:14, prior to the start of the procedure. A Minimum amount of bleeding was controlled with Pressure. The procedure was tolerated well. Post Debridement Measurements: 0.9cm length x 3.5cm width x 0.4cm depth; 0.99cm^3 volume. Character of Wound/Ulcer Post Debridement is stable. Severity of Tissue Post Debridement is: Fat layer exposed. Post procedure Diagnosis Wound #1: Same as Pre-Procedure Plan Follow-up Appointments: Return Appointment in 1 week. - Monday 06/04/2021 10:15 with Dr. Mikey Bussing and Lennox Laity, Room 7. Bathing/ Shower/ Hygiene: Other Bathing/Shower/Hygiene Orders/Instructions: - Clean with Saline or Dakins Negative Presssure Wound Therapy: Wound Vac to wound continuously at 14mm/hg pressure - Will order vac from [employer] Black [employer] Edema Control - Lymphedema / SCD / Other: Elevate legs to the level of the heart or above for 30 minutes daily and/or when sitting, a frequency of: - throughout the day Avoid standing for long periods of time. Moisturize legs daily. Off-Loading: Open toe surgical shoe to: - May use surgical shoe Additional Orders / Instructions: Follow Nutritious Diet - -Monitor/Control Blood Sugar -High Protein Diet WOUND #1: - Amputation Site - Transmetatarsal Wound Laterality: Right Cleanser: Normal Saline (Generic) Every Other Day/30 Days Discharge Instructions: Cleanse the wound with Normal Saline prior to applying a clean dressing using gauze sponges, not tissue or cotton balls. Prim Dressing: Promogran  Prisma Matrix, 4.34 (sq in) (silver collagen) Every Other Day/30 Days ary Discharge Instructions: Moisten collagen with hydrogel or [place] Jelly Secondary Dressing: ABD Pad, 5x9 (Generic) Every Other Day/30 Days Discharge Instructions: Apply over primary dressing as directed. Secondary Dressing: Woven Gauze Sponge, Non-Sterile 4x4 in (Generic) Every Other Day/30 Days Discharge Instructions: Apply over primary dressing as directed. Secured With: Elastic Bandage 4 inch (ACE bandage) (Generic) Every Other Day/30 Days Discharge Instructions: Secure with ACE bandage as directed. Secured With: [employer], 4.5x3.1 (in/yd) (Generic) Every Other Day/30 Days Discharge Instructions: Secure with Kerlix as directed. Secured With: 24M Medipore H Soft Cloth Surgical T ape, 4 x 10 (in/yd) (Generic) Every Other Day/30 Days Discharge Instructions: Secure with tape as directed. 1. In office sharp debridement 2. Collagen 3. Offloadingoosurgical shoe 4. Try and Order wound VAC through [facility] Electronic Signature(s) Signed: 05/31/2021 11:01:51 AM By: Geralyn Corwin DO Entered By: Mikey Bussing,  Sheriff Rodenberg on 05/31/2021 11:01:17 -------------------------------------------------------------------------------- HxROS Details Patient Name: Date of Service: [name], [name] 05/31/2021 9:30 A M Medical Record Number: [id number] Patient Account Number: [id number] Date of Birth/Sex: Treating RN: 08-24-1980 (41 y.o. [name] Primary Care Provider: Gwinda Passe Other Clinician: Referring Provider: Treating Provider/Extender: Grace Isaac in Treatment: 18 Information Obtained From Patient Cardiovascular Medical History: Positive for: Hypertension Endocrine Medical History: Positive for: Type II Diabetes Time with diabetes: Dx 2009 Treated with: Insulin, Oral agents Blood sugar tested every day: Yes Tested : daily Musculoskeletal Medical History: Positive for:  Osteomyelitis - Right Transmet 12/18/20 Neurologic Medical History: Positive for: Neuropathy Immunizations Pneumococcal Vaccine: Received Pneumococcal Vaccination: No Implantable Devices Yes Family and Social History Cancer: Yes - Paternal Grandparents; Diabetes: Yes - Mother; Heart Disease: No; Hereditary Spherocytosis: No; Hypertension: Yes - Mother; Kidney Disease: No; Lung Disease: No; Seizures: No; Stroke: Yes - Maternal Grandparents; Thyroid Problems: No; Tuberculosis: No; Never smoker; Marital Status - Single; Alcohol Use: Rarely; Drug Use: Prior History - Marijuana; Caffeine Use: Daily; Financial Concerns: No; Food, Clothing or Shelter Needs: No; Support System Lacking: No; Transportation Concerns: No Electronic Signature(s) Signed: 05/31/2021 11:01:51 AM By: Geralyn Corwin DO Signed: 05/31/2021 6:05:14 PM By: Antonieta Iba Entered By: Geralyn Corwin on 05/31/2021 10:56:16 -------------------------------------------------------------------------------- SuperBill Details Patient Name: Date of Service: [name], [name] 05/31/2021 Medical Record Number: [id number] Patient Account Number: [id number] Date of Birth/Sex: Treating RN: 13-Jan-1981 (41 y.o. [name] Primary Care Provider: Gwinda Passe Other Clinician: Referring Provider: Treating Provider/Extender: Grace Isaac in Treatment: 18 Diagnosis Coding ICD-10 Codes Code Description [phone] Non-pressure chronic ulcer of other part of right foot with necrosis of bone E11.621 Type 2 diabetes mellitus with foot ulcer M86.9 Osteomyelitis, unspecified Facility Procedures CPT4 Code: [id number] Description: 11042 - DEB SUBQ TISSUE 20 SQ CM/< ICD-10 Diagnosis Description L97.514 Non-pressure chronic ulcer of other part of right foot with necrosis of bone Modifier: Quantity: 1 Physician Procedures : CPT4 Code Description Modifier [id number] 11042 - WC PHYS SUBQ TISS 20 SQ CM 1 ICD-10  Diagnosis Description L97.514 Non-pressure chronic ulcer of other part of right foot with necrosis of bone Quantity: Electronic Signature(s) Signed: 05/31/2021 11:01:51 AM By: Geralyn Corwin DO Entered By: Geralyn Corwin on 05/31/2021 11:01:25

## 2021-06-04 ENCOUNTER — Encounter (HOSPITAL_BASED_OUTPATIENT_CLINIC_OR_DEPARTMENT_OTHER): Payer: Medicaid Other | Admitting: Internal Medicine

## 2021-06-05 ENCOUNTER — Encounter (HOSPITAL_BASED_OUTPATIENT_CLINIC_OR_DEPARTMENT_OTHER): Payer: Medicaid Other | Admitting: Internal Medicine

## 2021-06-05 DIAGNOSIS — L97514 Non-pressure chronic ulcer of other part of right foot with necrosis of bone: Secondary | ICD-10-CM

## 2021-06-05 DIAGNOSIS — E11621 Type 2 diabetes mellitus with foot ulcer: Secondary | ICD-10-CM

## 2021-06-05 DIAGNOSIS — M869 Osteomyelitis, unspecified: Secondary | ICD-10-CM | POA: Diagnosis not present

## 2021-06-05 NOTE — Progress Notes (Signed)
[name], [name] ([id number]) Visit Report for 06/05/2021 Chief Complaint Document Details Patient Name: Date of Service: [name], [name] [name] 06/05/2021 11:00 A M Medical Record Number: [id number] Patient Account Number: [id number] Date of Birth/Sex: Treating RN: [birth year] (41 y.o. [name] Primary Care Provider: Juluis Mire Other Clinician: Referring Provider: Treating Provider/Extender: Edmonia Lynch in Treatment: 19 Information Obtained from: Patient Chief Complaint Osteomyelitis of the right foot status post transmetatarsal amputation with surgical site dehiscence Electronic Signature(s) Signed: 06/05/2021 1:51:55 PM By: Kalman Shan DO Entered By: Kalman Shan on 06/05/2021 13:45:52 -------------------------------------------------------------------------------- HPI Details Patient Name: Date of Service: [name] V IS, [name] 06/05/2021 11:00 A M Medical Record Number: [id number] Patient Account Number: [id number] Date of Birth/Sex: Treating RN: February 01, 1980 (41 y.o. [name] Primary Care Provider: Juluis Mire Other Clinician: Referring Provider: Treating Provider/Extender: Edmonia Lynch in Treatment: 19 History of Present Illness HPI Description: Admission 01/22/2021 [name] a 41 year old female with a past medical history of insulin-dependent uncontrolled type 2 diabetes with last hemoglobin A1c of 13.5, osteomyelitis of the right foot status post transmetatarsal amputation on 12/18/2020 that presents to the clinic for right foot wound. She has had dehiscence of the surgical site. [name] using wet-to-dry dressings. She has a PICC line and receiving IV ceftriaxone daily for her osteomyelitis. There [name] an end date of 01/27/2021. [name] also taking oral metronidazole. She currently denies systemic signs of infection. 1/19; patient presents for follow-up. She was diagnosed with a  DVT to the right lower extremity 2 days ago. [name] on Eliquis now. [name] scheduled to see her infectious disease doctor tomorrow. She has been using Dakin's wet-to-dry dressings. She denies systemic signs of infection. 1/26; patient presents for follow-up. She saw infectious disease on 1/21 started on Augmentin. Her PICC line and IV ceftriaxone was discontinued. Patient reports stability to her wound. She has been using Dakin's wet-to-dry dressings. She currently denies systemic signs of infection. 2/3; patient presents for follow-up. She continues to use Dakin's wet-to-dry dressings to the wound bed. She saw Dr. Yvette Rack with infectious disease yesterday and [name] continuing Augmentin. T entative end date [name] 2/16. Patient reports following up with orthopedics. She states there [name] no further plan from them. She currently denies systemic signs of infection. 2/10; patient presents for follow-up. She continues to use Dakin's wet to dry dressings. [name] scheduled to have her MRI done on 2/14. She states that she had pain to the debridement site from last clinic visit and declines debridement today. She denies systemic signs of infection. She continues to have yellow thick drainage. 2/20; patient presents for follow-up. She continues to use Dakin's wet-to-dry dressings. She obtained her MRI. The results showed an abscess and [name] scheduled to see her orthopedic surgeon on 2/23. She saw infectious disease 2/17 and her antibiotics were extended. She currently denies systemic signs of infection. 3/6; patient presents for follow-up. She had debridement and irrigation of her foot on 03/10/2021 due to abscess noted on MRI. She was started on IV ceftriaxone and oral Flagyl. She has no issues or complaints today. She has been using iodoform packing to the tunnel and Dakin's wet-to-dry to the opening. 03/26/2021: She continues on IV ceftriaxone and oral metronidazole. She has follow-up with infectious disease  tomorrow. No significant issues or complaints today. Her mother continues to help her with her wound dressing, using iodoform packing strips into the tunnel and Dakin's  to the open portion of the wound. 3/20; patient presents for follow-up. She continues to be on IV ceftriaxone in oral metronidazole. She has been using iodoform to the tunnel and Dakin's wet-to- dry to the open wound. She denies signs of infection. 3/27; patient presents for follow-up. She no longer has a PICC line. She has been using iodoform to the tunnel and Dakin's wet-to-dry to the open wound. She reports improvement in wound healing. She denies signs of infection. 4/3; patient presents for follow-up. She states she has been using Hydrofera Blue to the open wound and iodoform packing to the tunnel without any issues. She denies signs of infection. 4/18; patient presents for follow-up. She saw infectious disease on 4/11. She has finished her oral antibiotics and completed a total of 6 weeks of antibiotics (this includes IV as well). No further antibiotics needed. She has been using Hydrofera Blue and iodoform packing. She states that the tunneled area has come in and the iodoform [name] not staying in place anymore. She has no issues or complaints today. She denies signs of infection. 4/24; patient presents for follow-up. She saw Dr. Despina Pole, plastic surgery to discuss potential skin graft/substitute placement. At this time he thinks that the skin graft would likely not take. He [name] in agreement with trying a wound VAC. Patient has been using Hydrofera Blue dressing changes with no issues. She denies signs of infection. She reports improvement in wound healing. 5/1; patient presents for follow-up. Unfortunately patient did not have insurance when we ran for the [place]ico. There [name] an assistance program and we are trying to get this accommodated for the patient. In the meantime she has been using Hydrofera Blue without any issues. She denies  signs of infection. 5/8; patient presents for follow-up. We have not heard back if pico [name] covered by her insurance. She has been using collagen to the wound bed over the past week. She denies signs of infection. 5/18; patient presents for follow-up. She has been using collagen to the wound bed without issues. Again we have not heard if pico [name] covered by her insurance. She has no issues or complaints today. 5/23; patient presents for follow-up. She has been using collagen to the wound bed. She has no issues or complaints today. She obtained the wound VAC from [facility] and brought this in today. She denies signs of infection. Electronic Signature(s) Signed: 06/05/2021 1:51:55 PM By: Kalman Shan DO Entered By: Kalman Shan on 06/05/2021 13:46:45 -------------------------------------------------------------------------------- Physical Exam Details Patient Name: Date of Service: [name] V IS, [name] 06/05/2021 11:00 A M Medical Record Number: [id number] Patient Account Number: [id number] Date of Birth/Sex: Treating RN: May 21, 1980 (41 y.o. [name] Primary Care Provider: Juluis Mire Other Clinician: Referring Provider: Treating Provider/Extender: Edmonia Lynch in Treatment: 19 Constitutional respirations regular, non-labored and within target range for patient.. Cardiovascular 2+ dorsalis pedis/posterior tibialis pulses. Psychiatric pleasant and cooperative. Notes Right foot: Transmetatarsal amputation with wound dehiscence. At the surgical site there [name] fibrinous tissue with some budding granulation tissue to the medial aspect. Granulation tissue to the rest of the wound bed. No signs of surrounding infection. Electronic Signature(s) Signed: 06/05/2021 1:51:55 PM By: Kalman Shan DO Entered By: Kalman Shan on 06/05/2021 13:47:38 -------------------------------------------------------------------------------- Physician Orders  Details Patient Name: Date of Service: [name] V IS, [name] 06/05/2021 11:00 A M Medical Record Number: [id number] Patient Account Number: [id number] Date of Birth/Sex: Treating RN: 12-10-1980 (41 y.o. [name] Primary Care Provider: Juluis Mire  Other Clinician: Referring Provider: Treating Provider/Extender: Edmonia Lynch in [id number] Verbal / Phone Orders: No Diagnosis Coding Follow-up Appointments ppointment in 1 week. - 06/14/21 @ 9:00 with Dr. Heber Albertson and Tammi Klippel, Room 8. Return A Nurse Visit: - Friday 06/08/21 @ 10:00am Bathing/ Shower/ Hygiene Other Bathing/Shower/Hygiene Orders/Instructions: - Clean with Saline or Dakins Negative Presssure Wound Therapy Wound Vac to wound continuously at 169mm/hg pressure - Wound Vac started 06/05/21 Black Foam Edema Control - Lymphedema / SCD / Other Elevate legs to the level of the heart or above for 30 minutes daily and/or when sitting, a frequency of: - throughout the day Avoid standing for long periods of time. Moisturize legs daily. Off-Loading Open toe surgical shoe to: - May use surgical shoe Additional Orders / Instructions Follow Nutritious Diet - -Monitor/Control Blood Sugar -High Protein Diet Wound Treatment Wound #1 - Amputation Site - Transmetatarsal Wound Laterality: Right Cleanser: Normal Saline (Generic) Every Other Day/30 Days Discharge Instructions: Cleanse the wound with Normal Saline prior to applying a clean dressing using gauze sponges, not tissue or cotton balls. Secured With: Elastic Bandage 4 inch (ACE bandage) (Generic) Every Other Day/30 Days Discharge Instructions: Secure with ACE bandage as directed. Secured With: [employer], 4.5x3.1 (in/yd) (Generic) Every Other Day/30 Days Discharge Instructions: Secure with Kerlix as directed. Electronic Signature(s) Signed: 06/05/2021 1:51:55 PM By: Kalman Shan DO Entered By: Kalman Shan on 06/05/2021  13:48:03 -------------------------------------------------------------------------------- Problem List Details Patient Name: Date of Service: [name] V IS, [name] 06/05/2021 11:00 A M Medical Record Number: [id number] Patient Account Number: [id number] Date of Birth/Sex: Treating RN: Jun 19, 1980 (41 y.o. [name] Primary Care Provider: Juluis Mire Other Clinician: Referring Provider: Treating Provider/Extender: Edmonia Lynch in Treatment: 19 Active Problems ICD-10 Encounter Code Description Active Date MDM Diagnosis L97.514 Non-pressure chronic ulcer of other part of right foot with necrosis of bone 01/22/2021 No Yes E11.621 Type 2 diabetes mellitus with foot ulcer 01/22/2021 No Yes M86.9 Osteomyelitis, unspecified 01/22/2021 No Yes Inactive Problems Resolved Problems Electronic Signature(s) Signed: 06/05/2021 1:51:55 PM By: Kalman Shan DO Entered By: Kalman Shan on 06/05/2021 13:45:37 -------------------------------------------------------------------------------- Progress Note Details Patient Name: Date of Service: [name] V IS, [name] 06/05/2021 11:00 A M Medical Record Number: [id number] Patient Account Number: [id number] Date of Birth/Sex: Treating RN: 07/11/1980 (41 y.o. [name] Primary Care Provider: Juluis Mire Other Clinician: Referring Provider: Treating Provider/Extender: Edmonia Lynch in Treatment: 19 Subjective Chief Complaint Information obtained from Patient Osteomyelitis of the right foot status post transmetatarsal amputation with surgical site dehiscence History of Present Illness (HPI) Admission 01/22/2021 Ms. Tyyonna Rollerson [name] a 41 year old female with a past medical history of insulin-dependent uncontrolled type 2 diabetes with last hemoglobin A1c of 13.5, osteomyelitis of the right foot status post transmetatarsal amputation on 12/18/2020 that presents to the clinic for  right foot wound. She has had dehiscence of the surgical site. [name] using wet-to-dry dressings. She has a PICC line and receiving IV ceftriaxone daily for her osteomyelitis. There [name] an end date of 01/27/2021. [name] also taking oral metronidazole. She currently denies systemic signs of infection. 1/19; patient presents for follow-up. She was diagnosed with a DVT to the right lower extremity 2 days ago. [name] on Eliquis now. [name] scheduled to see her infectious disease doctor tomorrow. She has been using Dakin's wet-to-dry dressings. She denies systemic signs of infection. 1/26; patient presents for follow-up. She saw infectious disease on  1/21 started on Augmentin. Her PICC line and IV ceftriaxone was discontinued. Patient reports stability to her wound. She has been using Dakin's wet-to-dry dressings. She currently denies systemic signs of infection. 2/3; patient presents for follow-up. She continues to use Dakin's wet-to-dry dressings to the wound bed. She saw Dr. Yvette Rack with infectious disease yesterday and [name] continuing Augmentin. T entative end date [name] 2/16. Patient reports following up with orthopedics. She states there [name] no further plan from them. She currently denies systemic signs of infection. 2/10; patient presents for follow-up. She continues to use Dakin's wet to dry dressings. [name] scheduled to have her MRI done on 2/14. She states that she had pain to the debridement site from last clinic visit and declines debridement today. She denies systemic signs of infection. She continues to have yellow thick drainage. 2/20; patient presents for follow-up. She continues to use Dakin's wet-to-dry dressings. She obtained her MRI. The results showed an abscess and [name] scheduled to see her orthopedic surgeon on 2/23. She saw infectious disease 2/17 and her antibiotics were extended. She currently denies systemic signs of infection. 3/6; patient presents for follow-up. She had  debridement and irrigation of her foot on 03/10/2021 due to abscess noted on MRI. She was started on IV ceftriaxone and oral Flagyl. She has no issues or complaints today. She has been using iodoform packing to the tunnel and Dakin's wet-to-dry to the opening. 03/26/2021: She continues on IV ceftriaxone and oral metronidazole. She has follow-up with infectious disease tomorrow. No significant issues or complaints today. Her mother continues to help her with her wound dressing, using iodoform packing strips into the tunnel and Dakin's to the open portion of the wound. 3/20; patient presents for follow-up. She continues to be on IV ceftriaxone in oral metronidazole. She has been using iodoform to the tunnel and Dakin's wet-to- dry to the open wound. She denies signs of infection. 3/27; patient presents for follow-up. She no longer has a PICC line. She has been using iodoform to the tunnel and Dakin's wet-to-dry to the open wound. She reports improvement in wound healing. She denies signs of infection. 4/3; patient presents for follow-up. She states she has been using Hydrofera Blue to the open wound and iodoform packing to the tunnel without any issues. She denies signs of infection. 4/18; patient presents for follow-up. She saw infectious disease on 4/11. She has finished her oral antibiotics and completed a total of 6 weeks of antibiotics (this includes IV as well). No further antibiotics needed. She has been using Hydrofera Blue and iodoform packing. She states that the tunneled area has come in and the iodoform [name] not staying in place anymore. She has no issues or complaints today. She denies signs of infection. 4/24; patient presents for follow-up. She saw Dr. Despina Pole, plastic surgery to discuss potential skin graft/substitute placement. At this time he thinks that the skin graft would likely not take. He [name] in agreement with trying a wound VAC. Patient has been using Hydrofera Blue dressing changes  with no issues. She denies signs of infection. She reports improvement in wound healing. 5/1; patient presents for follow-up. Unfortunately patient did not have insurance when we ran for the [place]ico. There [name] an assistance program and we are trying to get this accommodated for the patient. In the meantime she has been using Hydrofera Blue without any issues. She denies signs of infection. 5/8; patient presents for follow-up. We have not heard back if pico [name] covered by her  insurance. She has been using collagen to the wound bed over the past week. She denies signs of infection. 5/18; patient presents for follow-up. She has been using collagen to the wound bed without issues. Again we have not heard if pico [name] covered by her insurance. She has no issues or complaints today. 5/23; patient presents for follow-up. She has been using collagen to the wound bed. She has no issues or complaints today. She obtained the wound VAC from [facility] and brought this in today. She denies signs of infection. Patient History Information obtained from Patient. Family History Cancer - Paternal Grandparents, Diabetes - Mother, Hypertension - Mother, Stroke - Maternal Grandparents, No family history of Heart Disease, Hereditary Spherocytosis, Kidney Disease, Lung Disease, Seizures, Thyroid Problems, Tuberculosis. Social History Never smoker, Marital Status - Single, Alcohol Use - Rarely, Drug Use - Prior History - Marijuana, Caffeine Use - Daily. Medical History Cardiovascular Patient has history of Hypertension Endocrine Patient has history of Type II Diabetes Musculoskeletal Patient has history of Osteomyelitis - Right Transmet 12/18/20 Neurologic Patient has history of Neuropathy Objective Constitutional respirations regular, non-labored and within target range for patient.. Vitals Time Taken: 11:28 AM, Height: 69 in, Temperature: 98.2 F, Pulse: 82 bpm, Respiratory Rate: 20 breaths/min, Blood Pressure: 103/63  mmHg. Cardiovascular 2+ dorsalis pedis/posterior tibialis pulses. Psychiatric pleasant and cooperative. General Notes: Right foot: Transmetatarsal amputation with wound dehiscence. At the surgical site there [name] fibrinous tissue with some budding granulation tissue to the medial aspect. Granulation tissue to the rest of the wound bed. No signs of surrounding infection. Integumentary (Hair, Skin) Wound #1 status [name] Open. Original cause of wound was Surgical Injury. The date acquired was: 12/15/2020. The wound has been in treatment 19 weeks. The wound [name] located on the Right Amputation Site - Transmetatarsal. The wound measures 0.7cm length x 3.5cm width x 0.3cm depth; 1.924cm^2 area and 0.577cm^3 volume. There [name] Fat Layer (Subcutaneous Tissue) exposed. There [name] no tunneling or undermining noted. There [name] a medium amount of serosanguineous drainage noted. The wound margin [name] thickened. There [name] medium (34-66%) red, pink granulation within the wound bed. There [name] a medium (34- 66%) amount of necrotic tissue within the wound bed including Adherent Slough. Assessment Active Problems ICD-10 Non-pressure chronic ulcer of other part of right foot with necrosis of bone Type 2 diabetes mellitus with foot ulcer Osteomyelitis, unspecified Patient's wound [name] stable. No signs of surrounding infection. She has the wound VAC today. We will place this in office. She will follow-up later in the week for VAC change. Plan Follow-up Appointments: Return Appointment in 1 week. - 06/14/21 @ 9:00 with Dr. Heber Summerfield and Tammi Klippel, Room 8. Nurse Visit: - Friday 06/08/21 @ 10:00am Bathing/ Shower/ Hygiene: Other Bathing/Shower/Hygiene Orders/Instructions: - Clean with Saline or Dakins Negative Presssure Wound Therapy: Wound Vac to wound continuously at 163mm/hg pressure - Wound Vac started 06/05/21 Black Foam Edema Control - Lymphedema / SCD / Other: Elevate legs to the level of the heart or above for 30 minutes daily  and/or when sitting, a frequency of: - throughout the day Avoid standing for long periods of time. Moisturize legs daily. Off-Loading: Open toe surgical shoe to: - May use surgical shoe Additional Orders / Instructions: Follow Nutritious Diet - -Monitor/Control Blood Sugar -High Protein Diet WOUND #1: - Amputation Site - Transmetatarsal Wound Laterality: Right Cleanser: Normal Saline (Generic) Every Other Day/30 Days Discharge Instructions: Cleanse the wound with Normal Saline prior to applying a clean dressing using gauze sponges, not tissue or  cotton balls. Secured With: Elastic Bandage 4 inch (ACE bandage) (Generic) Every Other Day/30 Days Discharge Instructions: Secure with ACE bandage as directed. Secured With: [employer], 4.5x3.1 (in/yd) (Generic) Every Other Day/30 Days Discharge Instructions: Secure with Kerlix as directed. 1. Start wound VAC 2. Follow-up at the end of the week for [facility] change - Nurse visit 3. Follow-up in 1 week Electronic Signature(s) Signed: 06/05/2021 1:51:55 PM By: Kalman Shan DO Entered By: Kalman Shan on 06/05/2021 13:51:20 -------------------------------------------------------------------------------- HxROS Details Patient Name: Date of Service: [name] V IS, [name] 06/05/2021 11:00 A M Medical Record Number: [id number] Patient Account Number: [id number] Date of Birth/Sex: Treating RN: 11-24-1980 (41 y.o. [name] Primary Care Provider: Juluis Mire Other Clinician: Referring Provider: Treating Provider/Extender: Edmonia Lynch in Treatment: 19 Information Obtained From Patient Cardiovascular Medical History: Positive for: Hypertension Endocrine Medical History: Positive for: Type II Diabetes Time with diabetes: Dx 2009 Treated with: Insulin, Oral agents Blood sugar tested every day: Yes Tested : daily Musculoskeletal Medical History: Positive for: Osteomyelitis - Right Transmet  12/18/20 Neurologic Medical History: Positive for: Neuropathy Immunizations Pneumococcal Vaccine: Received Pneumococcal Vaccination: No Implantable Devices Yes Family and Social History Cancer: Yes - Paternal Grandparents; Diabetes: Yes - Mother; Heart Disease: No; Hereditary Spherocytosis: No; Hypertension: Yes - Mother; Kidney Disease: No; Lung Disease: No; Seizures: No; Stroke: Yes - Maternal Grandparents; Thyroid Problems: No; Tuberculosis: No; Never smoker; Marital Status - Single; Alcohol Use: Rarely; Drug Use: Prior History - Marijuana; Caffeine Use: Daily; Financial Concerns: No; Food, Clothing or Shelter Needs: No; Support System Lacking: No; Transportation Concerns: No Electronic Signature(s) Signed: 06/05/2021 1:51:55 PM By: Kalman Shan DO Signed: 06/05/2021 4:09:03 PM By: Lorrin Jackson Entered By: Kalman Shan on 06/05/2021 13:46:51 -------------------------------------------------------------------------------- SuperBill Details Patient Name: Date of Service: [name] V IS, [name] 06/05/2021 Medical Record Number: [id number] Patient Account Number: [id number] Date of Birth/Sex: Treating RN: 03-24-1980 (41 y.o. [name] Primary Care Provider: Juluis Mire Other Clinician: Referring Provider: Treating Provider/Extender: Edmonia Lynch in Treatment: 19 Diagnosis Coding ICD-10 Codes Code Description [phone] Non-pressure chronic ulcer of other part of right foot with necrosis of bone E11.621 Type 2 diabetes mellitus with foot ulcer M86.9 Osteomyelitis, unspecified Facility Procedures CPT4 Code: [id number] Description: [id number] - WOUND VAC-50 SQ CM OR LESS ICD-10 Diagnosis Description L97.514 Non-pressure chronic ulcer of other part of right foot with necrosis of bone Modifier: Quantity: 1 Physician Procedures : CPT4 Code Description Modifier [id number] - WC PHYS LEVEL 3 - EST PT ICD-10 Diagnosis Description L97.514  Non-pressure chronic ulcer of other part of right foot with necrosis of bone E11.621 Type 2 diabetes mellitus with foot ulcer M86.9  Osteomyelitis, unspecified Quantity: 1 Electronic Signature(s) Signed: 06/05/2021 1:51:55 PM By: Kalman Shan DO Entered By: Kalman Shan on 06/05/2021 13:51:35

## 2021-06-05 NOTE — Progress Notes (Signed)
[name], [name] ([id number]) Visit Report for 06/05/2021 Arrival Information Details Patient Name: Date of Service: [name] IS, [name] [name] 06/05/2021 11:00 A M Medical Record Number: [id number] Patient Account Number: [id number] Date of Birth/Sex: Treating RN: [name] (41 y.o. Helene Shoe, Meta.Reding Primary Care Alajiah Dutkiewicz: Juluis Mire Other Clinician: Referring Tal Kempker: Treating Blessings Inglett/Extender: Edmonia Lynch in Treatment: 5 Visit Information History Since Last Visit Added or deleted any medications: No Patient Arrived: Wheel Chair Any new allergies or adverse reactions: No Arrival Time: 11:26 Had a fall or experienced change in No Accompanied By: mother activities of daily living that may affect Transfer Assistance: None risk of falls: Patient Identification Verified: Yes Signs or symptoms of abuse/neglect since No Secondary Verification Process Completed: Yes last visito Patient Requires Transmission-Based No Hospitalized since last visit: No Precautions: Implantable device outside of the clinic No Patient Has Alerts: Yes excluding Patient Alerts: Patient on Blood Thinner cellular tissue based products placed in the PICC R Arm center ABI 12/17/20 R=1.08 since last visit: L=1.13 Has Dressing in Place as Prescribed: Yes Has Footwear/Offloading in Place as Yes Prescribed: Right: Surgical Shoe with Pressure Relief Insole Pain Present Now: No Notes Patient has wound vac supplies with her. Electronic Signature(s) Signed: 06/05/2021 4:34:51 PM By: [name] RN, BSN Entered By: Deon [name] on 06/05/2021 11:27:26 -------------------------------------------------------------------------------- Encounter Discharge Information Details Patient Name: Date of Service: [name], [name] 06/05/2021 11:00 A M Medical Record Number: [id number] Patient Account Number: [id number] Date of Birth/Sex: Treating RN: [name] (41 y.o. [name] Primary Care Taleia Sadowski: Juluis Mire Other Clinician: Referring Lahela Woodin: Treating Tsion Inghram/Extender: Edmonia Lynch in Treatment: 19 Encounter Discharge Information Items Discharge Condition: Stable Ambulatory Status: Wheelchair Discharge Destination: Home Transportation: Private Auto Accompanied By: mother Schedule Follow-up Appointment: Yes Clinical Summary of Care: Provided on 06/05/2021 Form Type Recipient Paper Patient Patient Electronic Signature(s) Signed: 06/05/2021 4:09:03 PM By: Lorrin Jackson Entered By: Lorrin Jackson on 06/05/2021 11:57:25 -------------------------------------------------------------------------------- Lower Extremity Assessment Details Patient Name: Date of Service: [name], [name] 06/05/2021 11:00 A M Medical Record Number: [id number] Patient Account Number: [id number] Date of Birth/Sex: Treating RN: [name] (41 y.o. [name] Primary Care Blandina Renaldo: Juluis Mire Other Clinician: Referring Coryn Mosso: Treating Shirrell Solinger/Extender: Geryl Councilman Weeks in Treatment: 19 Edema Assessment Assessed: [Left: No] Patrice Paradise: Yes] Edema: [Left: N] [Right: o] Calf Left: Right: Point of Measurement: 34 cm From Medial Instep 47 cm Ankle Left: Right: Point of Measurement: 8 cm From Medial Instep 28 cm Vascular Assessment Pulses: Dorsalis Pedis Palpable: [Right:Yes] Electronic Signature(s) Signed: 06/05/2021 4:34:51 PM By: [name] RN, BSN Entered By: Deon [name] on 06/05/2021 11:32:02 -------------------------------------------------------------------------------- Multi Wound Chart Details Patient Name: Date of Service: [name], [name] 06/05/2021 11:00 A M Medical Record Number: [id number] Patient Account Number: [id number] Date of Birth/Sex: Treating RN: [name] (41 y.o. [name] Primary Care [name]: Juluis Mire Other Clinician: Referring  Milina Pagett: Treating Eliese Kerwood/Extender: Edmonia Lynch in Treatment: 19 Vital Signs Height(in): 28 Pulse(bpm): 54 Weight(lbs): Blood Pressure(mmHg): 103/63 Body Mass Index(BMI): Temperature(F): 98.2 Respiratory Rate(breaths/min): 20 Photos: [1:Right Amputation Site -] [N/A:N/A N/A] Wound Location: [1:Transmetatarsal Surgical Injury] [N/A:N/A] Wounding Event: [1:Diabetic Wound/Ulcer of the Lower] [N/A:N/A] Primary Etiology: [1:Extremity Hypertension, Type II Diabetes,] [N/A:N/A] Comorbid History: [1:Osteomyelitis, Neuropathy 12/15/2020] [N/A:N/A] Date Acquired: [1:19] [N/A:N/A] Weeks of Treatment: [1:Open] [N/A:N/A] Wound Status: [1:No] [N/A:N/A] Wound Recurrence: [1:0.7x3.5x0.3] [N/A:N/A] Measurements L x W x D (cm) [1:1.924] [N/A:N/A] A (  cm) : rea [1:0.577] [N/A:N/A] Volume (cm) : [1:95.50%] [N/A:N/A] % Reduction in A rea: [1:99.20%] [N/A:N/A] % Reduction in Volume: [1:Grade 3] [N/A:N/A] Classification: [1:Medium] [N/A:N/A] Exudate A mount: [1:Serosanguineous] [N/A:N/A] Exudate Type: [1:red, brown] [N/A:N/A] Exudate Color: [1:Thickened] [N/A:N/A] Wound Margin: [1:Medium (34-66%)] [N/A:N/A] Granulation A mount: [1:Red, Pink] [N/A:N/A] Granulation Quality: [1:Medium (34-66%)] [N/A:N/A] Necrotic A mount: [1:Fat Layer (Subcutaneous Tissue): Yes N/A] Exposed Structures: [1:Fascia: No Tendon: No Muscle: No Joint: No Bone: No Large (67-100%)] [N/A:N/A] Epithelialization: [1:Negative Pressure Wound Therapy] [N/A:N/A] Procedures Performed: [1:Application (NPWT)] Treatment Notes Wound #1 (Amputation Site - Transmetatarsal) Wound Laterality: Right Cleanser Normal Saline Discharge Instruction: Cleanse the wound with Normal Saline prior to applying a clean dressing using gauze sponges, not tissue or cotton balls. Peri-Wound Care Topical Primary Dressing Secondary Dressing Secured With Elastic Bandage 4 inch (ACE bandage) Discharge Instruction: Secure  with ACE bandage as directed. Kerlix Roll Sterile, 4.5x3.1 (in/yd) Discharge Instruction: Secure with Kerlix as directed. Compression Wrap Compression Stockings Add-Ons Electronic Signature(s) Signed: 06/05/2021 1:51:55 PM By: Kalman Shan DO Signed: 06/05/2021 4:09:03 PM By: Fara Chute By: Kalman Shan on 06/05/2021 13:45:44 -------------------------------------------------------------------------------- Multi-Disciplinary Care Plan Details Patient Name: Date of Service: [name], [name] 06/05/2021 11:00 A M Medical Record Number: [id number] Patient Account Number: [id number] Date of Birth/Sex: Treating RN: [birth year][name] (41 y.o. [name] Primary Care Karalina Tift: Juluis Mire Other Clinician: Referring Trinitey Roache: Treating Joelee Snoke/Extender: Edmonia Lynch in Treatment: 19 Active Inactive Nutrition Nursing Diagnoses: Impaired glucose control: actual or potential Goals: Patient/caregiver verbalizes understanding of need to maintain therapeutic glucose control per primary care physician Date Initiated: 01/22/2021 Target Resolution Date: 07/03/2021 Goal Status: Active Interventions: Assess HgA1c results as ordered upon admission and as needed Provide education on elevated blood sugars and impact on wound healing Treatment Activities: Obtain HgA1c : 01/22/2021 Notes: Wound/Skin Impairment Nursing Diagnoses: Impaired tissue integrity Goals: Patient/caregiver will verbalize understanding of skin care regimen Date Initiated: 01/22/2021 Target Resolution Date: 07/03/2021 Goal Status: Active Ulcer/skin breakdown will have a volume reduction of 30% by week 4 Date Initiated: 01/22/2021 Date Inactivated: 03/19/2021 Target Resolution Date: 03/16/2021 Goal Status: Met Ulcer/skin breakdown will have a volume reduction of 50% by week 8 Date Initiated: 03/19/2021 Date Inactivated: 05/14/2021 Target Resolution Date: 04/16/2021 Unmet Reason: see  wound Goal Status: Unmet measurements Interventions: Assess patient/caregiver ability to obtain necessary supplies Assess patient/caregiver ability to perform ulcer/skin care regimen upon admission and as needed Assess ulceration(s) every visit Provide education on ulcer and skin care Treatment Activities: Topical wound management initiated : 01/22/2021 Notes: 06/04/21: Wound vac started Electronic Signature(s) Signed: 06/05/2021 4:09:03 PM By: Lorrin Jackson Entered By: Lorrin Jackson on 06/05/2021 11:45:02 -------------------------------------------------------------------------------- Negative Pressure Wound Therapy Application (NPWT) Details Patient Name: Date of Service: [name] IS, [name] 06/05/2021 11:00 A M Medical Record Number: [id number] Patient Account Number: [id number] Date of Birth/Sex: Treating RN: [birth year] (41 y.o. [name] Primary Care Deaysia Grigoryan: Juluis Mire Other Clinician: Referring Jatara Huettner: Treating Jcion Buddenhagen/Extender: Edmonia Lynch in Treatment: 19 NPWT Application Performed for: Wound #1 Right Amputation Site - Transmetatarsal Performed By: Lorrin Jackson, RN Type: VAC System Coverage Size (sq cm): 2.45 Pressure Type: Constant Pressure Setting: 125 mmHG Drain Type: None Quantity of Sponges/Gauze Inserted: 1 Sponge/Dressing Type: Foam, Black Date Initiated: 06/05/2021 Post Procedure Diagnosis Same as Pre-procedure Electronic Signature(s) Signed: 06/05/2021 4:09:03 PM By: Lorrin Jackson Entered By: Lorrin Jackson on 06/05/2021 11:55:37 -------------------------------------------------------------------------------- Pain Assessment Details Patient Name: Date of Service: [name], [place]  [name] 06/05/2021 11:00 A M Medical Record Number: [id number] Patient Account Number: [id number] Date of Birth/Sex: Treating RN: [birth year], [name] (41 y.o. [name] Primary Care Tavi Hoogendoorn: Juluis Mire Other  Clinician: Referring Skarleth Delmonico: Treating Norbert Malkin/Extender: Edmonia Lynch in Treatment: 19 Active Problems Location of Pain Severity and Description of Pain Patient Has Paino No Site Locations Rate the pain. Current Pain Level: 0 Pain Management and Medication Current Pain Management: Medication: No Cold Application: No Rest: No Massage: No Activity: No T.E.N.S.: No Heat Application: No Leg drop or elevation: No Is the Current Pain Management Adequate: Adequate How does your wound impact your activities of daily livingo Sleep: No Bathing: No Appetite: No Relationship With Others: No Bladder Continence: No Emotions: No Bowel Continence: No Work: No Toileting: No Drive: No Dressing: No Hobbies: No Engineer, maintenance) Signed: 06/05/2021 4:34:51 PM By: [name] RN, BSN Entered By: Deon [name] on 06/05/2021 11:29:54 -------------------------------------------------------------------------------- Patient/Caregiver Education Details Patient Name: Date of Service: [name], [name] 5/23/2023andnbsp11:00 A M Medical Record Number: [id number] Patient Account Number: [id number] Date of Birth/Gender: Treating RN: [birth year] [name] (41 y.o. [name] Primary Care Physician: Juluis Mire Other Clinician: Referring Physician: Treating Physician/Extender: Edmonia Lynch in Treatment: 19 Education Assessment Education Provided To: Patient Education Topics Provided Elevated Blood Sugar/ Impact on Healing: Methods: Explain/Verbal, Printed Responses: State content correctly Wound/Skin Impairment: Methods: Demonstration, Explain/Verbal, Printed Responses: State content correctly Electronic Signature(s) Signed: 06/05/2021 4:09:03 PM By: Lorrin Jackson Entered By: Lorrin Jackson on 06/05/2021 11:45:24 -------------------------------------------------------------------------------- Wound Assessment  Details Patient Name: Date of Service: [name], [name] 06/05/2021 11:00 A M Medical Record Number: [id number] Patient Account Number: [id number] Date of Birth/Sex: Treating RN: [birth year] [name] (41 y.o. Helene Shoe, Tammi Klippel Primary Care Verma Grothaus: Juluis Mire Other Clinician: Referring [place]: Treating Talar Fraley/Extender: Edmonia Lynch in Treatment: 19 Wound Status Wound Number: 1 Primary Etiology: Diabetic Wound/Ulcer of the Lower Extremity Wound Location: Right Amputation Site - Transmetatarsal Wound Status: Open Wounding Event: Surgical Injury Comorbid Hypertension, Type II Diabetes, Osteomyelitis, History: Neuropathy Date Acquired: 12/15/2020 Weeks Of Treatment: 19 Clustered Wound: No Photos Wound Measurements Length: (cm) 0.7 Width: (cm) 3.5 Depth: (cm) 0.3 Area: (cm) 1.924 Volume: (cm) 0.577 % Reduction in Area: 95.5% % Reduction in Volume: 99.2% Epithelialization: Large (67-100%) Tunneling: No Undermining: No Wound Description Classification: Grade 3 Wound Margin: Thickened Exudate Amount: Medium Exudate Type: Serosanguineous Exudate Color: red, brown Foul Odor After Cleansing: No Slough/Fibrino Yes Wound Bed Granulation Amount: Medium (34-66%) Exposed Structure Granulation Quality: Red, Pink Fascia Exposed: No Necrotic Amount: Medium (34-66%) Fat Layer (Subcutaneous Tissue) Exposed: Yes Necrotic Quality: Adherent Slough Tendon Exposed: No Muscle Exposed: No Joint Exposed: No Bone Exposed: No Treatment Notes Wound #1 (Amputation Site - Transmetatarsal) Wound Laterality: Right Cleanser Normal Saline Discharge Instruction: Cleanse the wound with Normal Saline prior to applying a clean dressing using gauze sponges, not tissue or cotton balls. Peri-Wound Care Topical Primary Dressing Secondary Dressing Secured With Elastic Bandage 4 inch (ACE bandage) Discharge Instruction: Secure with ACE bandage as directed. Kerlix Roll  Sterile, 4.5x3.1 (in/yd) Discharge Instruction: Secure with Kerlix as directed. Compression Wrap Compression Stockings Add-Ons Electronic Signature(s) Signed: 06/05/2021 4:34:51 PM By: [name] RN, BSN Entered By: Deon [name] on 06/05/2021 11:34:40 -------------------------------------------------------------------------------- Vitals Details Patient Name: Date of Service: [name], [name] 06/05/2021 11:00 A M Medical Record Number: [id number] Patient Account Number: [id number] Date of Birth/Sex: Treating RN: [birth year] (41 y.o. Helene Shoe, Tammi Klippel Primary  Care Channing Yeager: Juluis Mire Other Clinician: Referring Savior Himebaugh: Treating Haniya Fern/Extender: Edmonia Lynch in Treatment: 19 Vital Signs Time Taken: 11:28 Temperature (F): 98.2 Height (in): 69 Pulse (bpm): 82 Respiratory Rate (breaths/min): 20 Blood Pressure (mmHg): 103/63 Reference Range: 80 - 120 mg / dl Electronic Signature(s) Signed: 06/05/2021 4:34:51 PM By: [name] RN, BSN Entered By: Deon [name] on 06/05/2021 11:29:45

## 2021-06-07 ENCOUNTER — Other Ambulatory Visit: Payer: Self-pay

## 2021-06-08 ENCOUNTER — Encounter (HOSPITAL_BASED_OUTPATIENT_CLINIC_OR_DEPARTMENT_OTHER): Payer: Medicaid Other | Admitting: General Surgery

## 2021-06-08 DIAGNOSIS — L97514 Non-pressure chronic ulcer of other part of right foot with necrosis of bone: Secondary | ICD-10-CM | POA: Diagnosis not present

## 2021-06-12 NOTE — Progress Notes (Signed)
Klenke, Saharra L. ([id number]) Visit Report for 06/08/2021 Arrival Information Details Patient Name: Date of Service: [name] IS, [place] [name] 06/08/2021 10:00 A M Medical Record Number: [id number] Patient Account Number: [id number] Date of Birth/Sex: Treating RN: March 12, 1980 (41 y.o. [name] Primary Care Sanaii Caporaso: Gwinda Passe Other Clinician: Referring Brookelynn Hamor: Treating Bina Veenstra/Extender: Sharyl Nimrod in Treatment: 19 Visit Information History Since Last Visit Added or deleted any medications: No Patient Arrived: Wheel Chair Any new allergies or adverse reactions: No Arrival Time: 10:28 Had a fall or experienced change in No Accompanied By: mother activities of daily living that may affect Transfer Assistance: None risk of falls: Patient Identification Verified: Yes Signs or symptoms of abuse/neglect since No Secondary Verification Process Completed: Yes last visito Patient Requires Transmission-Based No Hospitalized since last visit: No Precautions: Implantable device outside of the clinic No Patient Has Alerts: Yes excluding Patient Alerts: Patient on Blood Thinner cellular tissue based products placed in the PICC R Arm center ABI 12/17/20 R=1.08 since last visit: L=1.13 Has Dressing in Place as Prescribed: Yes Has Footwear/Offloading in Place as Yes Prescribed: Right: Surgical Shoe with Pressure Relief Insole Pain Present Now: No Electronic Signature(s) Signed: 06/12/2021 4:27:43 PM By: Antonieta Iba Entered By: Antonieta Iba on 06/08/2021 10:30:43 -------------------------------------------------------------------------------- Encounter Discharge Information Details Patient Name: Date of Service: [name] V IS, [name] 06/08/2021 10:00 A M Medical Record Number: [id number] Patient Account Number: [id number] Date of Birth/Sex: Treating RN: Nov 02, 1980 (41 y.o. [name] Primary Care [name]: Gwinda Passe Other  Clinician: Referring Lanna Labella: Treating Latisia Hilaire/Extender: Sharyl Nimrod in Treatment: 19 Encounter Discharge Information Items Discharge Condition: Stable Ambulatory Status: Wheelchair Discharge Destination: Home Transportation: Private Auto Accompanied By: mother Schedule Follow-up Appointment: Yes Clinical Summary of Care: Patient Declined Electronic Signature(s) Signed: 06/12/2021 4:27:43 PM By: Antonieta Iba Entered By: Antonieta Iba on 06/08/2021 11:04:03 -------------------------------------------------------------------------------- Negative Pressure Wound Therapy Maintenance (NPWT) Details Patient Name: Date of Service: [name] IS, [name] 06/08/2021 10:00 A M Medical Record Number: [id number] Patient Account Number: [id number] Date of Birth/Sex: Treating RN: February 06, 1980 (41 y.o. [name] Primary Care [name]: Gwinda Passe Other Clinician: Referring Bari Leib: Treating Jazira Maloney/Extender: Sharyl Nimrod in Treatment: 19 NPWT Maintenance Performed for: Wound #1 Right Amputation Site - Transmetatarsal Performed By: Antonieta Iba, RN Type: VAC System Coverage Size (sq cm): 2.1 Pressure Type: Constant Pressure Setting: 125 mmHG Drain Type: None Sponge/Dressing Type: Foam, Black Date Initiated: 06/05/2021 Dressing Removed: Yes Canister Changed: Yes Dressing Reapplied: Yes Quantity of Sponges/Gauze Inserted: 1 Days On NPWT : 4 Electronic Signature(s) Signed: 06/12/2021 4:27:43 PM By: Antonieta Iba Entered By: Antonieta Iba on 06/08/2021 11:03:20 -------------------------------------------------------------------------------- Patient/Caregiver Education Details Patient Name: Date of Service: [name] [name] Kitten, [name] 5/26/2023andnbsp10:00 A M Medical Record Number: [id number] Patient Account Number: [id number] Date of Birth/Gender: Treating RN: 03-24-1980 (41 y.o. [name] Primary Care  Physician: Gwinda Passe Other Clinician: Referring Physician: Treating Physician/Extender: Sharyl Nimrod in Treatment: 19 Education Assessment Education Provided To: Patient Education Topics Provided Elevated Blood Sugar/ Impact on Healing: Methods: Explain/Verbal Responses: State content correctly Wound/Skin Impairment: Methods: Explain/Verbal Responses: State content correctly Electronic Signature(s) Signed: 06/12/2021 4:27:43 PM By: Antonieta Iba Entered By: Antonieta Iba on 06/08/2021 11:03:43 -------------------------------------------------------------------------------- Wound Assessment Details Patient Name: Date of Service: [name] V IS, [name] 06/08/2021 10:00 A M Medical Record Number: [id number] Patient Account Number: [id number] Date of Birth/Sex: Treating RN: 1980-03-17 (41 y.o. [name]  Primary Care [name]: Gwinda Passe Other Clinician: Referring Renarda Mullinix: Treating Kaisa Wofford/Extender: Sharyl Nimrod in Treatment: 19 Wound Status Wound Number: 1 Primary Etiology: Diabetic Wound/Ulcer of the Lower Extremity Wound Location: Right Amputation Site - Transmetatarsal Wound Status: Open Wounding Event: Surgical Injury Comorbid Hypertension, Type II Diabetes, Osteomyelitis, History: Neuropathy Date Acquired: 12/15/2020 Weeks Of Treatment: 19 Clustered Wound: No Wound Measurements Length: (cm) 0.6 Width: (cm) 3.5 Depth: (cm) 0.3 Area: (cm) 1.649 Volume: (cm) 0.495 % Reduction in Area: 96.1% % Reduction in Volume: 99.3% Epithelialization: Large (67-100%) Tunneling: No Undermining: No Wound Description Classification: Grade 3 Wound Margin: Thickened Exudate Amount: Medium Exudate Type: Serosanguineous Exudate Color: red, brown Foul Odor After Cleansing: No Slough/Fibrino Yes Wound Bed Granulation Amount: Large (67-100%) Exposed Structure Granulation Quality: Red, Pink Fascia  Exposed: No Necrotic Amount: Small (1-33%) Fat Layer (Subcutaneous Tissue) Exposed: Yes Necrotic Quality: Adherent Slough Tendon Exposed: No Muscle Exposed: No Joint Exposed: No Bone Exposed: No Treatment Notes Wound #1 (Amputation Site - Transmetatarsal) Wound Laterality: Right Cleanser Normal Saline Discharge Instruction: Cleanse the wound with Normal Saline prior to applying a clean dressing using gauze sponges, not tissue or cotton balls. Peri-Wound Care Topical Primary Dressing Secondary Dressing Secured With Elastic Bandage 4 inch (ACE bandage) Discharge Instruction: Secure with ACE bandage as directed. Kerlix Roll Sterile, 4.5x3.1 (in/yd) Discharge Instruction: Secure with Kerlix as directed. Compression Wrap Compression Stockings Add-Ons Electronic Signature(s) Signed: 06/12/2021 4:27:43 PM By: Antonieta Iba Entered By: Antonieta Iba on 06/08/2021 10:37:02 -------------------------------------------------------------------------------- Vitals Details Patient Name: Date of Service: [name] V IS, [name] 06/08/2021 10:00 A M Medical Record Number: [id number] Patient Account Number: [id number] Date of Birth/Sex: Treating RN: 02/07/1980 (41 y.o. [name] Primary Care Akiah Bauch: Gwinda Passe Other Clinician: Referring Kingsten Enfield: Treating Katherinne Mofield/Extender: Sharyl Nimrod in Treatment: 19 Vital Signs Time Taken: 10:30 Temperature (F): 98.6 Height (in): 69 Pulse (bpm): 81 Respiratory Rate (breaths/min): 18 Blood Pressure (mmHg): 114/72 Reference Range: 80 - 120 mg / dl Electronic Signature(s) Signed: 06/12/2021 4:27:43 PM By: Antonieta Iba Entered By: Antonieta Iba on 06/08/2021 10:32:45

## 2021-06-12 NOTE — Progress Notes (Signed)
Weimer, Cire L. ([id number]) Visit Report for 06/08/2021 SuperBill Details Patient Name: Date of Service: [name], [place] [name] 06/08/2021 Medical Record Number: [id number] Patient Account Number: [id number] Date of Birth/Sex: Treating RN: [birth year] (41 y.o. 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

## 2021-06-14 ENCOUNTER — Encounter (HOSPITAL_BASED_OUTPATIENT_CLINIC_OR_DEPARTMENT_OTHER): Payer: Medicaid Other | Attending: General Surgery | Admitting: Internal Medicine

## 2021-06-14 DIAGNOSIS — L97514 Non-pressure chronic ulcer of other part of right foot with necrosis of bone: Secondary | ICD-10-CM | POA: Insufficient documentation

## 2021-06-14 DIAGNOSIS — M869 Osteomyelitis, unspecified: Secondary | ICD-10-CM | POA: Insufficient documentation

## 2021-06-14 DIAGNOSIS — Z7901 Long term (current) use of anticoagulants: Secondary | ICD-10-CM | POA: Insufficient documentation

## 2021-06-14 DIAGNOSIS — Z86718 Personal history of other venous thrombosis and embolism: Secondary | ICD-10-CM | POA: Insufficient documentation

## 2021-06-14 DIAGNOSIS — E1169 Type 2 diabetes mellitus with other specified complication: Secondary | ICD-10-CM | POA: Insufficient documentation

## 2021-06-14 DIAGNOSIS — E11621 Type 2 diabetes mellitus with foot ulcer: Secondary | ICD-10-CM | POA: Diagnosis present

## 2021-06-14 DIAGNOSIS — Z89421 Acquired absence of other right toe(s): Secondary | ICD-10-CM | POA: Diagnosis not present

## 2021-06-14 DIAGNOSIS — Z794 Long term (current) use of insulin: Secondary | ICD-10-CM | POA: Diagnosis not present

## 2021-06-14 NOTE — Progress Notes (Signed)
[name], [name] ([id number]) Visit Report for 06/14/2021 Arrival Information Details Patient Name: Date of Service: [name] [name], [name] [name] 06/14/2021 9:00 A M Medical Record Number: [id number] Patient Account Number: [id number] Date of Birth/Sex: Treating RN: [birth year] (41 y.o. Helene Shoe, Meta.Reding Primary Care [name]: [name] Other Clinician: Referring Lucah Petta: Treating Mickeal Daws/Extender: Edmonia Lynch in Treatment: 20 Visit Information History Since Last Visit Added or deleted any medications: No Patient Arrived: Wheel Chair Any new allergies or adverse reactions: No Arrival Time: 09:15 Had a fall or experienced change in No Accompanied By: mother activities of daily living that may affect Transfer Assistance: None risk of falls: Patient Identification Verified: Yes Signs or symptoms of abuse/neglect since last visito No Secondary Verification Process Completed: Yes Hospitalized since last visit: No Patient Requires Transmission-Based No Implantable device outside of the clinic excluding No Precautions: cellular tissue based products placed in the center Patient Has Alerts: Yes since last visit: Patient Alerts: Patient on Blood Thinner Has Dressing in Place as Prescribed: Yes PICC R Arm Pain Present Now: No ABI 12/17/20 R=1.08 L=1.13 Electronic Signature(s) Signed: 06/14/2021 4:48:29 PM By: [name] RN, BSN Entered By: [name] on 06/14/2021 09:25:24 -------------------------------------------------------------------------------- Encounter Discharge Information Details Patient Name: Date of Service: [name] V IS, [name] 06/14/2021 9:00 A M Medical Record Number: [id number] Patient Account Number: [id number] Date of Birth/Sex: Treating RN: [birth year] (41 y.o. [name] Primary Care Adaysha Dubinsky: [name] Other Clinician: Referring Ahyana Skillin: Treating Damaya Channing/Extender: Edmonia Lynch in Treatment:  20 Encounter Discharge Information Items Post Procedure Vitals Discharge Condition: Stable Temperature (F): 98.2 Ambulatory Status: Wheelchair Pulse (bpm): 85 Discharge Destination: Home Respiratory Rate (breaths/min): 16 Transportation: Private Auto Blood Pressure (mmHg): 105/72 Accompanied By: mother Schedule Follow-up Appointment: Yes Clinical Summary of Care: Electronic Signature(s) Signed: 06/14/2021 4:48:29 PM By: [name] RN, BSN Entered By: [name] on 06/14/2021 09:49:13 -------------------------------------------------------------------------------- Lower Extremity Assessment Details Patient Name: Date of Service: [name] V IS, [name] 06/14/2021 9:00 A M Medical Record Number: [id number] Patient Account Number: [id number] Date of Birth/Sex: Treating RN: 03-29-1980 (41 y.o. [name] Primary Care Emanuel Dowson: [name] Other Clinician: Referring Preeti Winegardner: Treating Kaslyn Richburg/Extender: Geryl Councilman Weeks in Treatment: 20 Edema Assessment Assessed: [Left: No] [Right: Yes] Edema: [Left: Ye] [Right: s] Calf Left: Right: Point of Measurement: 34 cm From Medial Instep 48 cm Ankle Left: Right: Point of Measurement: 8 cm From Medial Instep 29 cm Vascular Assessment Pulses: Dorsalis Pedis Palpable: [Right:Yes] Electronic Signature(s) Signed: 06/14/2021 4:48:29 PM By: [name] RN, BSN Entered By: [name] on 06/14/2021 09:25:55 -------------------------------------------------------------------------------- Multi Wound Chart Details Patient Name: Date of Service: [name] V IS, [name] 06/14/2021 9:00 A M Medical Record Number: [id number] Patient Account Number: [id number] Date of Birth/Sex: Treating RN: [birth year] (41 y.o. [name] Primary Care Gabrien Mentink: [name] Other Clinician: Referring Mace Weinberg: Treating Akera Snowberger/Extender: Edmonia Lynch in Treatment: 20 Vital  Signs Height(in): 56 Pulse(bpm): 39 Weight(lbs): Blood Pressure(mmHg): 105/72 Body Mass Index(BMI): Temperature(F): 98.2 Respiratory Rate(breaths/min): 16 Photos: [1:Right Amputation Site -] [N/A:N/A N/A] Wound Location: [1:Transmetatarsal Surgical Injury] [N/A:N/A] Wounding Event: [1:Diabetic Wound/Ulcer of the Lower] [N/A:N/A] Primary Etiology: [1:Extremity Hypertension, Type II Diabetes,] [N/A:N/A] Comorbid History: [1:Osteomyelitis, Neuropathy 12/15/2020] [N/A:N/A] Date Acquired: [1:20] [N/A:N/A] Weeks of Treatment: [1:Open] [N/A:N/A] Wound Status: [1:No] [N/A:N/A] Wound Recurrence: [1:0.7x3.3x0.5] [N/A:N/A] Measurements L x W x D (cm) [1:1.814] [N/A:N/A] A (cm) : rea [1:0.907] [N/A:N/A] Volume (cm) : [1:95.70%] [N/A:N/A] % Reduction  in A [1:rea: 98.70%] [N/A:N/A] % Reduction in Volume: [1:Grade 3] [N/A:N/A] Classification: [1:Medium] [N/A:N/A] Exudate A mount: [1:Serosanguineous] [N/A:N/A] Exudate Type: [1:red, brown] [N/A:N/A] Exudate Color: [1:Thickened] [N/A:N/A] Wound Margin: [1:Large (67-100%)] [N/A:N/A] Granulation A mount: [1:Red, Pink] [N/A:N/A] Granulation Quality: [1:Small (1-33%)] [N/A:N/A] Necrotic A mount: [1:Fat Layer (Subcutaneous Tissue): Yes N/A] Exposed Structures: [1:Fascia: No Tendon: No Muscle: No Joint: No Bone: No Medium (34-66%)] [N/A:N/A] Epithelialization: [1:Debridement - Excisional] [N/A:N/A] Debridement: Pre-procedure Verification/Time Out 09:35 [N/A:N/A] Taken: [1:Lidocaine 5% topical ointment] [N/A:N/A] Pain Control: [1:Callus, Subcutaneous, Slough] [N/A:N/A] Tissue Debrided: [1:Skin/Subcutaneous Tissue] [N/A:N/A] Level: [1:5.25] [N/A:N/A] Debridement A (sq cm): [1:rea Curette] [N/A:N/A] Instrument: [1:Minimum] [N/A:N/A] Bleeding: [1:Pressure] [N/A:N/A] Hemostasis A chieved: [1:0] [N/A:N/A] Procedural Pain: [1:0] [N/A:N/A] Post Procedural Pain: [1:Procedure was tolerated well] [N/A:N/A] Debridement Treatment Response:  [1:0.7x3.3x0.5] [N/A:N/A] Post Debridement Measurements L x W x D (cm) [1:0.907] [N/A:N/A] Post Debridement Volume: (cm) [1:maceration present] [N/A:N/A] Assessment Notes: [1:Debridement] [N/A:N/A] Treatment Notes Wound #1 (Amputation Site - Transmetatarsal) Wound Laterality: Right Cleanser Normal Saline Discharge Instruction: Cleanse the wound with Normal Saline prior to applying a clean dressing using gauze sponges, not tissue or cotton balls. Peri-Wound Care Zinc Oxide Ointment 30g tube Discharge Instruction: Apply Zinc Oxide to periwound with each dressing change Topical Primary Dressing Promogran Prisma Matrix, 4.34 (sq in) (silver collagen) Discharge Instruction: Moisten collagen with hydrogel Secondary Dressing ABD Pad, 5x9 Discharge Instruction: Apply over primary dressing as directed. Secured With Elastic Bandage 4 inch (ACE bandage) Discharge Instruction: Secure with ACE bandage as directed. Kerlix Roll Sterile, 4.5x3.1 (in/yd) Discharge Instruction: Secure with Kerlix as directed. Compression Wrap Compression Stockings Add-Ons Electronic Signature(s) Signed: 06/14/2021 11:55:17 AM By: Kalman Shan DO Signed: 06/14/2021 4:48:29 PM By: [name] RN, BSN Entered By: Kalman Shan on 06/14/2021 10:38:27 -------------------------------------------------------------------------------- Multi-Disciplinary Care Plan Details Patient Name: Date of Service: [name] V IS, [name] 06/14/2021 9:00 A M Medical Record Number: [id number] Patient Account Number: [id number] Date of Birth/Sex: Treating RN: [birth year] (41 y.o. Helene Shoe, Tammi Klippel Primary Care Adaline Trejos: [name] Other Clinician: Referring Benedetta Sundstrom: Treating Leeanna Slaby/Extender: Edmonia Lynch in Treatment: 20 Active Inactive Nutrition Nursing Diagnoses: Impaired glucose control: actual or potential Goals: Patient/caregiver verbalizes understanding of need to maintain therapeutic  glucose control per primary care physician Date Initiated: 01/22/2021 Target Resolution Date: 07/03/2021 Goal Status: Active Interventions: Assess HgA1c results as ordered upon admission and as needed Provide education on elevated blood sugars and impact on wound healing Treatment Activities: Obtain HgA1c : 01/22/2021 Notes: Wound/Skin Impairment Nursing Diagnoses: Impaired tissue integrity Goals: Patient/caregiver will verbalize understanding of skin care regimen Date Initiated: 01/22/2021 Target Resolution Date: 07/03/2021 Goal Status: Active Ulcer/skin breakdown will have a volume reduction of 30% by week 4 Date Initiated: 01/22/2021 Date Inactivated: 03/19/2021 Target Resolution Date: 03/16/2021 Goal Status: Met Ulcer/skin breakdown will have a volume reduction of 50% by week 8 Date Initiated: 03/19/2021 Date Inactivated: 05/14/2021 Target Resolution Date: 04/16/2021 Unmet Reason: see wound Goal Status: Unmet measurements Interventions: Assess patient/caregiver ability to obtain necessary supplies Assess patient/caregiver ability to perform ulcer/skin care regimen upon admission and as needed Assess ulceration(s) every visit Provide education on ulcer and skin care Treatment Activities: Topical wound management initiated : 01/22/2021 Notes: 06/04/21: Wound vac started Electronic Signature(s) Signed: 06/14/2021 4:48:29 PM By: [name] RN, BSN Entered By: [name] on 06/14/2021 09:29:34 -------------------------------------------------------------------------------- Pain Assessment Details Patient Name: Date of Service: [name] V IS, [name] 06/14/2021 9:00 A M Medical Record Number: [id number] Patient Account Number: [id number] Date of Birth/Sex: Treating  RN: 08-05-1980 (41 y.o. [name] Primary Care Daja Shuping: [name] Other Clinician: Referring Menachem Urbanek: Treating Piercen Covino/Extender: Edmonia Lynch in Treatment: 20 Active  Problems Location of Pain Severity and Description of Pain Patient Has Paino No Site Locations Rate the pain. Current Pain Level: 0 Pain Management and Medication Current Pain Management: Medication: No Cold Application: No Rest: No Massage: No Activity: No T.E.N.S.: No Heat Application: No Leg drop or elevation: No [name] the Current Pain Management Adequate: Adequate How does your wound impact your activities of daily livingo Sleep: No Bathing: No Appetite: No Relationship With Others: No Bladder Continence: No Emotions: No Bowel Continence: No Work: No Toileting: No Drive: No Dressing: No Hobbies: No Electronic Signature(s) Signed: 06/14/2021 4:48:29 PM By: [name] RN, BSN Entered By: [name] on 06/14/2021 09:25:46 -------------------------------------------------------------------------------- Patient/Caregiver Education Details Patient Name: Date of Service: [name] V IS, [name] 6/1/2023andnbsp9:00 A M Medical Record Number: [id number] Patient Account Number: [id number] Date of Birth/Gender: Treating RN: November 07, 1980 (42 y.o. [name] Primary Care Physician: [name] Other Clinician: Referring Physician: Treating Physician/Extender: Edmonia Lynch in Treatment: 20 Education Assessment Education Provided To: Patient Education Topics Provided Wound/Skin Impairment: Handouts: Skin Care Do's and Dont's Methods: Explain/Verbal Responses: Reinforcements needed Electronic Signature(s) Signed: 06/14/2021 4:48:29 PM By: [name] RN, BSN Entered By: [name] on 06/14/2021 09:30:12 -------------------------------------------------------------------------------- Wound Assessment Details Patient Name: Date of Service: [name] V IS, [name] 06/14/2021 9:00 A M Medical Record Number: [id number] Patient Account Number: [id number] Date of Birth/Sex: Treating RN: [birth year] (41 y.o. 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 Wound Status: Open Wounding Event: Surgical Injury Comorbid Hypertension, Type II Diabetes, Osteomyelitis, History: Neuropathy Date Acquired: 12/15/2020 Weeks Of Treatment: 20 Clustered Wound: No Photos Wound Measurements Length: (cm) 0.7 Width: (cm) 3.3 Depth: (cm) 0.5 Area: (cm) 1.814 Volume: (cm) 0.907 % Reduction in Area: 95.7% % Reduction in Volume: 98.7% Epithelialization: Medium (34-66%) Tunneling: No Undermining: No Wound Description Classification: Grade 3 Wound Margin: Thickened Exudate Amount: Medium Exudate Type: Serosanguineous Exudate Color: red, brown Foul Odor After Cleansing: No Slough/Fibrino Yes Wound Bed Granulation Amount: Large (67-100%) Exposed Structure Granulation Quality: Red, Pink Fascia Exposed: No Necrotic Amount: Small (1-33%) Fat Layer (Subcutaneous Tissue) Exposed: Yes Necrotic Quality: Adherent Slough Tendon Exposed: No Muscle Exposed: No Joint Exposed: No Bone Exposed: No Assessment Notes maceration present Treatment Notes Wound #1 (Amputation Site - Transmetatarsal) Wound Laterality: Right Cleanser Normal Saline Discharge Instruction: Cleanse the wound with Normal Saline prior to applying a clean dressing using gauze sponges, not tissue or cotton balls. Peri-Wound Care Zinc Oxide Ointment 30g tube Discharge Instruction: Apply Zinc Oxide to periwound with each dressing change Topical Primary Dressing Promogran Prisma Matrix, 4.34 (sq in) (silver collagen) Discharge Instruction: Moisten collagen with hydrogel Secondary Dressing ABD Pad, 5x9 Discharge Instruction: Apply over primary dressing as directed. Secured With Elastic  Bandage 4 inch (ACE bandage) Discharge Instruction: Secure with ACE bandage as directed. Kerlix Roll Sterile, 4.5x3.1 (in/yd) Discharge Instruction: Secure with Kerlix as directed. Compression Wrap Compression Stockings Add-Ons Electronic Signature(s) Signed: 06/14/2021 4:48:29 PM By: [name] RN, BSN Entered By: [name] on 06/14/2021 09:26:59 -------------------------------------------------------------------------------- Vitals Details Patient Name: Date of Service: [name] V IS, [name] 06/14/2021 9:00 [place]  Record Number: [id number] Patient Account Number: [id number] Date of Birth/Sex: Treating RN: [birth year] (41 y.o. Helene Shoe, Meta.Reding Primary Care Robie Mcniel: [name] Other Clinician: Referring Patrese Neal: Treating Saje Gallop/Extender: Edmonia Lynch in Treatment: 20 Vital Signs Time Taken: 09:15 Temperature (F): 98.2 Height (in): 69 Pulse (bpm): 85 Respiratory Rate (breaths/min): 16 Blood Pressure (mmHg): 105/72 Reference Range: 80 - 120 mg / dl Electronic Signature(s) Signed: 06/14/2021 4:48:29 PM By: [name] RN, BSN Entered By: [name] on 06/14/2021 09:25:38

## 2021-06-14 NOTE — Progress Notes (Signed)
[name], [name] ([id number]) Visit Report for 06/14/2021 Chief Complaint Document Details Patient Name: Date of Service: DA Seth BakeV IS, [name] 06/14/2021 9:00 A M Medical Record Number: [id number] Patient Account Number: [id number] Date of Birth/Sex: Treating RN: 02-May-1980 (41 y.o. [name]) [name], [name] Primary Care Provider: Gwinda PasseEdwards, [name] Other Clinician: Referring Provider: Treating Provider/Extender: [name], [name] Duffus [name], [name] Weeks in Treatment: 20 Information Obtained from: Patient Chief Complaint Osteomyelitis of the right foot status post transmetatarsal amputation with surgical site dehiscence Electronic Signature(s) Signed: 06/14/2021 11:55:17 AM By: [name], [name] Entered By: [name], Desman Polak on 06/14/2021 10:38:35 -------------------------------------------------------------------------------- Debridement Details Patient Name: Date of Service: [name], [name] 06/14/2021 9:00 A M Medical Record Number: [id number] Patient Account Number: [id number] Date of Birth/Sex: Treating RN: 02-May-1980 (41 y.o. [name]) [name], [name] Primary Care Provider: Gwinda PasseEdwards, [name] Other Clinician: Referring Provider: Treating Provider/Extender: [name], Shilee Biggs [name], [name] Weeks in Treatment: 20 Debridement Performed for Assessment: Wound #1 Right Amputation Site - Transmetatarsal Performed By: Physician [name], [name] Wilms, [name] Debridement Type: Debridement Severity of Tissue Pre Debridement: Fat layer exposed Level of Consciousness (Pre-procedure): Awake and Alert Pre-procedure Verification/Time Out Yes - 09:35 Taken: Start Time: 09:36 Pain Control: Lidocaine 5% topical ointment T Area Debrided (L x W): otal 1.5 (cm) x 3.5 (cm) = 5.25 (cm) Tissue and other material debrided: Viable, Non-Viable, Callus, Slough, Subcutaneous, Fibrin/Exudate, Slough Level: Skin/Subcutaneous Tissue Debridement Description: Excisional Instrument: Curette Bleeding: Minimum Hemostasis Achieved:  Pressure End Time: 09:42 Procedural Pain: 0 Post Procedural Pain: 0 Response to Treatment: Procedure was tolerated well Level of Consciousness (Post- Awake and Alert procedure): Post Debridement Measurements of Total Wound Length: (cm) 0.7 Width: (cm) 3.3 Depth: (cm) 0.5 Volume: (cm) 0.907 Character of Wound/Ulcer Post Debridement: Improved Severity of Tissue Post Debridement: Fat layer exposed Post Procedure Diagnosis Same as Pre-procedure Electronic Signature(s) Signed: 06/14/2021 11:55:17 AM By: [name], Emma Schupp [name] Signed: 06/14/2021 4:48:29 PM By: [name], [name] RN, BSN Entered By: [name], [name] on 06/14/2021 09:42:56 -------------------------------------------------------------------------------- HPI Details Patient Name: Date of Service: [name], [name] 06/14/2021 9:00 A M Medical Record Number: [id number] Patient Account Number: [id number] Date of Birth/Sex: Treating RN: 02-May-1980 (41 y.o. [name]) [name], [name] Primary Care Provider: Gwinda PasseEdwards, [name] Other Clinician: Referring Provider: Treating Provider/Extender: [name], Marry Kusch [name], [name] Weeks in Treatment: 20 History of Present Illness HPI Description: Admission 01/22/2021 [name] is a 41 year old female with a past medical history of insulin-dependent uncontrolled type 2 diabetes with last hemoglobin A1c of 13.5, osteomyelitis of the right foot status post transmetatarsal amputation on 12/18/2020 that presents to the clinic for right foot wound. She has had dehiscence of the surgical site. She is currently using wet-to-dry dressings. She has a PICC line and receiving IV ceftriaxone daily for her osteomyelitis. There is an end date of 01/27/2021. She is also taking oral metronidazole. She currently denies systemic signs of infection. 1/19; patient presents for follow-up. She was diagnosed with a DVT to the right lower extremity 2 days ago. She is on Eliquis now. She is scheduled to see her infectious disease  doctor tomorrow. She has been using Dakin's wet-to-dry dressings. She denies systemic signs of infection. 1/26; patient presents for follow-up. She saw infectious disease on 1/21 started on Augmentin. Her PICC line and IV ceftriaxone was discontinued. Patient reports stability to her wound. She has been using Dakin's wet-to-dry dressings. She currently denies systemic signs of infection. 2/3; patient presents for follow-up. She continues to use Dakin's wet-to-dry dressings to the wound bed. She  saw Dr. Manson Passey with infectious disease yesterday and is continuing Augmentin. T entative end date is 2/16. Patient reports following up with orthopedics. She states there is no further plan from them. She currently denies systemic signs of infection. 2/10; patient presents for follow-up. She continues to use Dakin's wet to dry dressings. She is scheduled to have her MRI done on 2/14. She states that she had pain to the debridement site from last clinic visit and declines debridement today. She denies systemic signs of infection. She continues to have yellow thick drainage. 2/20; patient presents for follow-up. She continues to use Dakin's wet-to-dry dressings. She obtained her MRI. The results showed an abscess and she is scheduled to see her orthopedic surgeon on 2/23. She saw infectious disease 2/17 and her antibiotics were extended. She currently denies systemic signs of infection. 3/6; patient presents for follow-up. She had debridement and irrigation of her foot on 03/10/2021 due to abscess noted on MRI. She was started on IV ceftriaxone and oral Flagyl. She has no issues or complaints today. She has been using iodoform packing to the tunnel and Dakin's wet-to-dry to the opening. 03/26/2021: She continues on IV ceftriaxone and oral metronidazole. She has follow-up with infectious disease tomorrow. No significant issues or complaints today. Her mother continues to help her with her wound dressing, using  iodoform packing strips into the tunnel and Dakin's to the open portion of the wound. 3/20; patient presents for follow-up. She continues to be on IV ceftriaxone in oral metronidazole. She has been using iodoform to the tunnel and Dakin's wet-to- dry to the open wound. She denies signs of infection. 3/27; patient presents for follow-up. She no longer has a PICC line. She has been using iodoform to the tunnel and Dakin's wet-to-dry to the open wound. She reports improvement in wound healing. She denies signs of infection. 4/3; patient presents for follow-up. She states she has been using Hydrofera Blue to the open wound and iodoform packing to the tunnel without any issues. She denies signs of infection. 4/18; patient presents for follow-up. She saw infectious disease on 4/11. She has finished her oral antibiotics and completed a total of 6 weeks of antibiotics (this includes IV as well). No further antibiotics needed. She has been using Hydrofera Blue and iodoform packing. She states that the tunneled area has come in and the iodoform is not staying in place anymore. She has no issues or complaints today. She denies signs of infection. 4/24; patient presents for follow-up. She saw Dr. Carlene Coria, plastic surgery to discuss potential skin graft/substitute placement. At this time he thinks that the skin graft would likely not take. He is in agreement with trying a wound VAC. Patient has been using Hydrofera Blue dressing changes with no issues. She denies signs of infection. She reports improvement in wound healing. 5/1; patient presents for follow-up. Unfortunately patient did not have insurance when we ran for the pico. There is an assistance program and we are trying to get this accommodated for the patient. In the meantime she has been using Hydrofera Blue without any issues. She denies signs of infection. 5/8; patient presents for follow-up. We have not heard back if pico is covered by her insurance.  She has been using collagen to the wound bed over the past week. She denies signs of infection. 5/18; patient presents for follow-up. She has been using collagen to the wound bed without issues. Again we have not heard if pico is covered by her insurance. She has  no issues or complaints today. 5/23; patient presents for follow-up. She has been using collagen to the wound bed. She has no issues or complaints today. She obtained the wound VAC from [facility] and brought this in today. She denies signs of infection. 6/1; patient presents for follow-up. She has been using the wound VAC for the past week. She has had this changed twice since she was last here. She reports more maceration to the periwound. She denies signs of infection. Electronic Signature(s) Signed: 06/14/2021 11:55:17 AM By: [name] Corwin [name] Entered By: [name] Corwin on 06/14/2021 10:39:49 -------------------------------------------------------------------------------- Physical Exam Details Patient Name: Date of Service: [name], [name] 06/14/2021 9:00 A M Medical Record Number: [id number] Patient Account Number: [id number] Date of Birth/Sex: Treating RN: [birth year] (41 y.o. [name] Silence Primary Care Provider: Gwinda Passe Other Clinician: Referring Provider: Treating Provider/Extender: [name] Isaac in Treatment: 20 Constitutional respirations regular, non-labored and within target range for patient.. Cardiovascular 2+ dorsalis pedis/posterior tibialis pulses. Psychiatric pleasant and cooperative. Notes Right foot: Transmetatarsal amputation with wound dehiscence. At the surgical site there is fibrinous tissue with evidence of granulation tissue growing over this. There is also nonviable tissue in the wound bed. No signs of surrounding infection. Electronic Signature(s) Signed: 06/14/2021 11:55:17 AM By: [name] Corwin [name] Entered By: [name] Corwin on 06/14/2021  10:41:15 -------------------------------------------------------------------------------- Physician Orders Details Patient Name: Date of Service: [name], [name] 06/14/2021 9:00 A M Medical Record Number: [id number] Patient Account Number: [id number] Date of Birth/Sex: Treating RN: 1980-11-05 (41 y.o. [name] Silence Primary Care Provider: Gwinda Passe Other Clinician: Referring Provider: Treating Provider/Extender: [name] Isaac in Treatment: 20 Verbal / Phone Orders: No Diagnosis Coding ICD-10 Coding Code Description L97.514 Non-pressure chronic ulcer of other part of right foot with necrosis of bone E11.621 Type 2 diabetes mellitus with foot ulcer M86.9 Osteomyelitis, unspecified Follow-up Appointments ppointment in 1 week. - Wednesday 06/20/2021 1245pm (Dr. Leanord Hawking covering) Dr. Mikey Bussing and [place], Room 8 Return A Monday 07/02/2021 945am Dr. Mikey Bussing and [place] Laity, Room 7 Bathing/ Shower/ Hygiene Other Bathing/Shower/Hygiene Orders/Instructions: - Clean with Saline or Dakins Negative Presssure Wound Therapy Wound Vac to wound continuously at 124mm/hg pressure - Wound Vac ***HOLD 06/14/2021 FOR THIS WEEK DUE TO MACERATION.*** Black Foam Edema Control - Lymphedema / SCD / Other Elevate legs to the level of the heart or above for 30 minutes daily and/or when sitting, a frequency of: - throughout the day Avoid standing for long periods of time. Moisturize legs daily. Off-Loading Open toe surgical shoe to: - May use surgical shoe Additional Orders / Instructions Follow Nutritious Diet - -Monitor/Control Blood Sugar -High Protein Diet Wound Treatment Wound #1 - Amputation Site - Transmetatarsal Wound Laterality: Right Cleanser: Normal Saline (Generic) 1 x Per Day/30 Days Discharge Instructions: Cleanse the wound with Normal Saline prior to applying a clean dressing using gauze sponges, not tissue or cotton balls. Peri-Wound Care: Zinc Oxide Ointment 30g  tube 1 x Per Day/30 Days Discharge Instructions: Apply Zinc Oxide to periwound with each dressing change Prim Dressing: Promogran Prisma Matrix, 4.34 (sq in) (silver collagen) ary 1 x Per Day/30 Days Discharge Instructions: Moisten collagen with hydrogel Secondary Dressing: ABD Pad, 5x9 1 x Per Day/30 Days Discharge Instructions: Apply over primary dressing as directed. Secured With: Elastic Bandage 4 inch (ACE bandage) (Generic) 1 x Per Day/30 Days Discharge Instructions: Secure with ACE bandage as directed. Secured With: [employer], 4.5x3.1 (in/yd) (Generic) 1 x Per Day/30  Days Discharge Instructions: Secure with Kerlix as directed. Electronic Signature(s) Signed: 06/14/2021 11:55:17 AM By: [name] Corwin [name] Entered By: [name] Corwin on 06/14/2021 10:41:34 -------------------------------------------------------------------------------- Problem List Details Patient Name: Date of Service: [name], [name] 06/14/2021 9:00 A M Medical Record Number: [id number] Patient Account Number: [id number] Date of Birth/Sex: Treating RN: Jul 27, 1980 (41 y.o. [name] Silence Primary Care Provider: Gwinda Passe Other Clinician: Referring Provider: Treating Provider/Extender: [name] Isaac in Treatment: 20 Active Problems ICD-10 Encounter Code Description Active Date MDM Diagnosis L97.514 Non-pressure chronic ulcer of other part of right foot with necrosis of bone 01/22/2021 No Yes E11.621 Type 2 diabetes mellitus with foot ulcer 01/22/2021 No Yes M86.9 Osteomyelitis, unspecified 01/22/2021 No Yes Inactive Problems Resolved Problems Electronic Signature(s) Signed: 06/14/2021 11:55:17 AM By: [name] Corwin [name] Entered By: [name] Corwin on 06/14/2021 10:38:21 -------------------------------------------------------------------------------- Progress Note Details Patient Name: Date of Service: [name], [name] 06/14/2021 9:00 A M Medical Record Number:  [id number] Patient Account Number: [id number] Date of Birth/Sex: Treating RN: 1981-01-04 (41 y.o. [name] Silence Primary Care Provider: Gwinda Passe Other Clinician: Referring Provider: Treating Provider/Extender: [name] Isaac in Treatment: 20 Subjective Chief Complaint Information obtained from Patient Osteomyelitis of the right foot status post transmetatarsal amputation with surgical site dehiscence History of Present Illness (HPI) Admission 01/22/2021 [name] is a 41 year old female with a past medical history of insulin-dependent uncontrolled type 2 diabetes with last hemoglobin A1c of 13.5, osteomyelitis of the right foot status post transmetatarsal amputation on 12/18/2020 that presents to the clinic for right foot wound. She has had dehiscence of the surgical site. She is currently using wet-to-dry dressings. She has a PICC line and receiving IV ceftriaxone daily for her osteomyelitis. There is an end date of 01/27/2021. She is also taking oral metronidazole. She currently denies systemic signs of infection. 1/19; patient presents for follow-up. She was diagnosed with a DVT to the right lower extremity 2 days ago. She is on Eliquis now. She is scheduled to see her infectious disease doctor tomorrow. She has been using Dakin's wet-to-dry dressings. She denies systemic signs of infection. 1/26; patient presents for follow-up. She saw infectious disease on 1/21 started on Augmentin. Her PICC line and IV ceftriaxone was discontinued. Patient reports stability to her wound. She has been using Dakin's wet-to-dry dressings. She currently denies systemic signs of infection. 2/3; patient presents for follow-up. She continues to use Dakin's wet-to-dry dressings to the wound bed. She saw Dr. Manson Passey with infectious disease yesterday and is continuing Augmentin. T entative end date is 2/16. Patient reports following up with orthopedics. She states there is no  further plan from them. She currently denies systemic signs of infection. 2/10; patient presents for follow-up. She continues to use Dakin's wet to dry dressings. She is scheduled to have her MRI done on 2/14. She states that she had pain to the debridement site from last clinic visit and declines debridement today. She denies systemic signs of infection. She continues to have yellow thick drainage. 2/20; patient presents for follow-up. She continues to use Dakin's wet-to-dry dressings. She obtained her MRI. The results showed an abscess and she is scheduled to see her orthopedic surgeon on 2/23. She saw infectious disease 2/17 and her antibiotics were extended. She currently denies systemic signs of infection. 3/6; patient presents for follow-up. She had debridement and irrigation of her foot on 03/10/2021 due to abscess noted on MRI. She was started on IV ceftriaxone  and oral Flagyl. She has no issues or complaints today. She has been using iodoform packing to the tunnel and Dakin's wet-to-dry to the opening. 03/26/2021: She continues on IV ceftriaxone and oral metronidazole. She has follow-up with infectious disease tomorrow. No significant issues or complaints today. Her mother continues to help her with her wound dressing, using iodoform packing strips into the tunnel and Dakin's to the open portion of the wound. 3/20; patient presents for follow-up. She continues to be on IV ceftriaxone in oral metronidazole. She has been using iodoform to the tunnel and Dakin's wet-to- dry to the open wound. She denies signs of infection. 3/27; patient presents for follow-up. She no longer has a PICC line. She has been using iodoform to the tunnel and Dakin's wet-to-dry to the open wound. She reports improvement in wound healing. She denies signs of infection. 4/3; patient presents for follow-up. She states she has been using Hydrofera Blue to the open wound and iodoform packing to the tunnel without any  issues. She denies signs of infection. 4/18; patient presents for follow-up. She saw infectious disease on 4/11. She has finished her oral antibiotics and completed a total of 6 weeks of antibiotics (this includes IV as well). No further antibiotics needed. She has been using Hydrofera Blue and iodoform packing. She states that the tunneled area has come in and the iodoform is not staying in place anymore. She has no issues or complaints today. She denies signs of infection. 4/24; patient presents for follow-up. She saw Dr. Carlene Coria, plastic surgery to discuss potential skin graft/substitute placement. At this time he thinks that the skin graft would likely not take. He is in agreement with trying a wound VAC. Patient has been using Hydrofera Blue dressing changes with no issues. She denies signs of infection. She reports improvement in wound healing. 5/1; patient presents for follow-up. Unfortunately patient did not have insurance when we ran for the pico. There is an assistance program and we are trying to get this accommodated for the patient. In the meantime she has been using Hydrofera Blue without any issues. She denies signs of infection. 5/8; patient presents for follow-up. We have not heard back if pico is covered by her insurance. She has been using collagen to the wound bed over the past week. She denies signs of infection. 5/18; patient presents for follow-up. She has been using collagen to the wound bed without issues. Again we have not heard if pico is covered by her insurance. She has no issues or complaints today. 5/23; patient presents for follow-up. She has been using collagen to the wound bed. She has no issues or complaints today. She obtained the wound VAC from [facility] and brought this in today. She denies signs of infection. 6/1; patient presents for follow-up. She has been using the wound VAC for the past week. She has had this changed twice since she was last here. She  reports more maceration to the periwound. She denies signs of infection. Patient History Information obtained from Patient. Family History Cancer - Paternal Grandparents, Diabetes - Mother, Hypertension - Mother, Stroke - Maternal Grandparents, No family history of Heart Disease, Hereditary Spherocytosis, Kidney Disease, Lung Disease, Seizures, Thyroid Problems, Tuberculosis. Social History Never smoker, Marital Status - Single, Alcohol Use - Rarely, Drug Use - Prior History - Marijuana, Caffeine Use - Daily. Medical History Cardiovascular Patient has history of Hypertension Endocrine Patient has history of Type II Diabetes Musculoskeletal Patient has history of Osteomyelitis - Right Transmet 12/18/20 Neurologic  Patient has history of Neuropathy Objective Constitutional respirations regular, non-labored and within target range for patient.. Vitals Time Taken: 9:15 AM, Height: 69 in, Temperature: 98.2 F, Pulse: 85 bpm, Respiratory Rate: 16 breaths/min, Blood Pressure: 105/72 mmHg. Cardiovascular 2+ dorsalis pedis/posterior tibialis pulses. Psychiatric pleasant and cooperative. General Notes: Right foot: Transmetatarsal amputation with wound dehiscence. At the surgical site there is fibrinous tissue with evidence of granulation tissue growing over this. There is also nonviable tissue in the wound bed. No signs of surrounding infection. Integumentary (Hair, Skin) Wound #1 status is Open. Original cause of wound was Surgical Injury. The date acquired was: 12/15/2020. The wound has been in treatment 20 weeks. The wound is located on the Right Amputation Site - Transmetatarsal. The wound measures 0.7cm length x 3.3cm width x 0.5cm depth; 1.814cm^2 area and 0.907cm^3 volume. There is Fat Layer (Subcutaneous Tissue) exposed. There is no tunneling or undermining noted. There is a medium amount of serosanguineous drainage noted. The wound margin is thickened. There is large (67-100%) red, pink  granulation within the wound bed. There is a small (1-33%) amount of necrotic tissue within the wound bed including Adherent Slough. General Notes: maceration present Assessment Active Problems ICD-10 Non-pressure chronic ulcer of other part of right foot with necrosis of bone Type 2 diabetes mellitus with foot ulcer Osteomyelitis, unspecified Patient's wound has done very well with the wound VAC. There is more granulation tissue present. I debrided nonviable tissue. Unfortunately she is developing maceration to the periwound. We will need to take a break from the wound VAC. She will need the wound VAC changed 3 times a week. Her mom is present and is willing to learn how to apply the wound VAC. We will train her at next clinic visit. For now I recommended using collagen daily. She can use AandD ointment to the macerated areas daily. Procedures Wound #1 Pre-procedure diagnosis of Wound #1 is a Diabetic Wound/Ulcer of the Lower Extremity located on the Right Amputation Site - Transmetatarsal .Severity of Tissue Pre Debridement is: Fat layer exposed. There was a Excisional Skin/Subcutaneous Tissue Debridement with a total area of 5.25 sq cm performed by [name] Corwin, [name]. With the following instrument(s): Curette to remove Viable and Non-Viable tissue/material. Material removed includes Callus, Subcutaneous Tissue, Slough, and Fibrin/Exudate after achieving pain control using Lidocaine 5% topical ointment. A time out was conducted at 09:35, prior to the start of the procedure. A Minimum amount of bleeding was controlled with Pressure. The procedure was tolerated well with a pain level of 0 throughout and a pain level of 0 following the procedure. Post Debridement Measurements: 0.7cm length x 3.3cm width x 0.5cm depth; 0.907cm^3 volume. Character of Wound/Ulcer Post Debridement is improved. Severity of Tissue Post Debridement is: Fat layer exposed. Post procedure Diagnosis Wound #1: Same as  Pre-Procedure Plan Follow-up Appointments: Return Appointment in 1 week. - Wednesday 06/20/2021 1245pm (Dr. Leanord Hawking covering) Dr. Mikey Bussing and [place], Room 8 Monday 07/02/2021 945am Dr. Mikey Bussing and [place] Laity, Room 7 Bathing/ Shower/ Hygiene: Other Bathing/Shower/Hygiene Orders/Instructions: - Clean with Saline or Dakins Negative Presssure Wound Therapy: Wound Vac to wound continuously at 161mm/hg pressure - Wound Vac ***HOLD 06/14/2021 FOR THIS WEEK DUE TO MACERATION.*** Black Foam Edema Control - Lymphedema / SCD / Other: Elevate legs to the level of the heart or above for 30 minutes daily and/or when sitting, a frequency of: - throughout the day Avoid standing for long periods of time. Moisturize legs daily. Off-Loading: Open toe surgical shoe to: - May use  surgical shoe Additional Orders / Instructions: Follow Nutritious Diet - -Monitor/Control Blood Sugar -High Protein Diet WOUND #1: - Amputation Site - Transmetatarsal Wound Laterality: Right Cleanser: Normal Saline (Generic) 1 x Per Day/30 Days Discharge Instructions: Cleanse the wound with Normal Saline prior to applying a clean dressing using gauze sponges, not tissue or cotton balls. Peri-Wound Care: Zinc Oxide Ointment 30g tube 1 x Per Day/30 Days Discharge Instructions: Apply Zinc Oxide to periwound with each dressing change Prim Dressing: Promogran Prisma Matrix, 4.34 (sq in) (silver collagen) 1 x Per Day/30 Days ary Discharge Instructions: Moisten collagen with hydrogel Secondary Dressing: ABD Pad, 5x9 1 x Per Day/30 Days Discharge Instructions: Apply over primary dressing as directed. Secured With: Elastic Bandage 4 inch (ACE bandage) (Generic) 1 x Per Day/30 Days Discharge Instructions: Secure with ACE bandage as directed. Secured With: [employer], 4.5x3.1 (in/yd) (Generic) 1 x Per Day/30 Days Discharge Instructions: Secure with Kerlix as directed. 1. In office sharp debridement 2. Collagen 3. Hold the wound VAC 4.  Follow-up in 1 week 5. AandD ointment Electronic Signature(s) Signed: 06/14/2021 11:55:17 AM By: [name] Corwin [name] Entered By: [name] Corwin on 06/14/2021 10:45:44 -------------------------------------------------------------------------------- HxROS Details Patient Name: Date of Service: [name], [name] 06/14/2021 9:00 A M Medical Record Number: [id number] Patient Account Number: [id number] Date of Birth/Sex: Treating RN: [birth year] (41 y.o. [name] Silence Primary Care Provider: Gwinda Passe Other Clinician: Referring Provider: Treating Provider/Extender: [name] Isaac in Treatment: 20 Information Obtained From Patient Cardiovascular Medical History: Positive for: Hypertension Endocrine Medical History: Positive for: Type II Diabetes Time with diabetes: Dx 2009 Treated with: Insulin, Oral agents Blood sugar tested every day: Yes Tested : daily Musculoskeletal Medical History: Positive for: Osteomyelitis - Right Transmet 12/18/20 Neurologic Medical History: Positive for: Neuropathy Immunizations Pneumococcal Vaccine: Received Pneumococcal Vaccination: No Implantable Devices Yes Family and Social History Cancer: Yes - Paternal Grandparents; Diabetes: Yes - Mother; Heart Disease: No; Hereditary Spherocytosis: No; Hypertension: Yes - Mother; Kidney Disease: No; Lung Disease: No; Seizures: No; Stroke: Yes - Maternal Grandparents; Thyroid Problems: No; Tuberculosis: No; Never smoker; Marital Status - Single; Alcohol Use: Rarely; Drug Use: Prior History - Marijuana; Caffeine Use: Daily; Financial Concerns: No; Food, Clothing or Shelter Needs: No; Support System Lacking: No; Transportation Concerns: No Electronic Signature(s) Signed: 06/14/2021 11:55:17 AM By: [name] Corwin [name] Signed: 06/14/2021 4:48:29 PM By: [name] Stall RN, BSN Entered By: [name] Corwin on 06/14/2021  10:39:54 -------------------------------------------------------------------------------- SuperBill Details Patient Name: Date of Service: [name], [name] 06/14/2021 Medical Record Number: [id number] Patient Account Number: [id number] Date of Birth/Sex: Treating RN: 11-06-1980 (41 y.o. [name], [place] Primary Care Provider: Gwinda Passe Other Clinician: Referring Provider: Treating Provider/Extender: [name] Isaac in Treatment: 20 Diagnosis Coding ICD-10 Codes Code Description [phone] Non-pressure chronic ulcer of other part of right foot with necrosis of bone E11.621 Type 2 diabetes mellitus with foot ulcer M86.9 Osteomyelitis, unspecified Facility Procedures CPT4 Code: [id number] Description: 11042 - DEB SUBQ TISSUE 20 SQ CM/< ICD-10 Diagnosis Description L97.514 Non-pressure chronic ulcer of other part of right foot with necrosis of bone Modifier: Quantity: 1 Physician Procedures : CPT4 Code Description Modifier [id number] 52841 - WC PHYS LEVEL 2 - EST PT ICD-10 Diagnosis Description L97.514 Non-pressure chronic ulcer of other part of right foot with necrosis of bone E11.621 Type 2 diabetes mellitus with foot ulcer M86.9  Osteomyelitis, unspecified Quantity: 1 : [id number] 11042 - WC PHYS SUBQ TISS 20 SQ CM ICD-10 Diagnosis  Description L97.514 Non-pressure chronic ulcer of other part of right foot with necrosis of bone Quantity: 1 Electronic Signature(s) Signed: 06/14/2021 11:55:17 AM By: [name] Corwin [name] Entered By: [name] Corwin on 06/14/2021 10:43:35

## 2021-06-20 ENCOUNTER — Encounter (HOSPITAL_BASED_OUTPATIENT_CLINIC_OR_DEPARTMENT_OTHER): Payer: Medicaid Other | Admitting: Internal Medicine

## 2021-06-20 DIAGNOSIS — E11621 Type 2 diabetes mellitus with foot ulcer: Secondary | ICD-10-CM | POA: Diagnosis not present

## 2021-06-20 NOTE — Progress Notes (Signed)
[name], [name] ([id number]) Visit Report for [name] HPI Details Patient Name: Date of Service: [name], [name] [name] 12:45 PM Medical Record Number: [id number] Patient Account Number: [id number] Date of Birth/Sex: Treating RN: 1980/05/28 (41 y.o. [name] Primary Care Provider: Juluis Mire Other Clinician: Referring Provider: Treating Provider/Extender: Fleet Contras in Treatment: 21 History of Present Illness HPI Description: Admission 01/22/2021 [name] [name] a 41 year old female with a past medical history of insulin-dependent uncontrolled type 2 diabetes with last hemoglobin A1c of 13.5, osteomyelitis of the right foot status post transmetatarsal amputation on 12/18/2020 that presents to the clinic for right foot wound. She has had dehiscence of the surgical site. [name] using wet-to-dry dressings. She has a PICC line and receiving IV ceftriaxone daily for her osteomyelitis. There [name] an end date of 01/27/2021. [name] also taking oral metronidazole. She currently denies systemic signs of infection. 1/19; patient presents for follow-up. She was diagnosed with a DVT to the right lower extremity 2 days ago. [name] on Eliquis now. [name] scheduled to see her infectious disease doctor tomorrow. She has been using Dakin's wet-to-dry dressings. She denies systemic signs of infection. 1/26; patient presents for follow-up. She saw infectious disease on 1/21 started on Augmentin. Her PICC line and IV ceftriaxone was discontinued. Patient reports stability to her wound. She has been using Dakin's wet-to-dry dressings. She currently denies systemic signs of infection. 2/3; patient presents for follow-up. She continues to use Dakin's wet-to-dry dressings to the wound bed. She saw Dr. Yvette Rack with infectious disease yesterday and [name] continuing Augmentin. T entative end date [name] 2/16. Patient reports following up with orthopedics. She states  there [name] no further plan from them. She currently denies systemic signs of infection. 2/10; patient presents for follow-up. She continues to use Dakin's wet to dry dressings. [name] scheduled to have her MRI done on 2/14. She states that she had pain to the debridement site from last clinic visit and declines debridement today. She denies systemic signs of infection. She continues to have yellow thick drainage. 2/20; patient presents for follow-up. She continues to use Dakin's wet-to-dry dressings. She obtained her MRI. The results showed an abscess and [name] scheduled to see her orthopedic surgeon on 2/23. She saw infectious disease 2/17 and her antibiotics were extended. She currently denies systemic signs of infection. 3/6; patient presents for follow-up. She had debridement and irrigation of her foot on 03/10/2021 due to abscess noted on MRI. She was started on IV ceftriaxone and oral Flagyl. She has no issues or complaints today. She has been using iodoform packing to the tunnel and Dakin's wet-to-dry to the opening. 03/26/2021: She continues on IV ceftriaxone and oral metronidazole. She has follow-up with infectious disease tomorrow. No significant issues or complaints today. Her mother continues to help her with her wound dressing, using iodoform packing strips into the tunnel and Dakin's to the open portion of the wound. 3/20; patient presents for follow-up. She continues to be on IV ceftriaxone in oral metronidazole. She has been using iodoform to the tunnel and Dakin's wet-to- dry to the open wound. She denies signs of infection. 3/27; patient presents for follow-up. She no longer has a PICC line. She has been using iodoform to the tunnel and Dakin's wet-to-dry to the open wound. She reports improvement in wound healing. She denies signs of infection. 4/3; patient presents for follow-up. She states she has been using Hydrofera Blue to the  open wound and iodoform packing to the tunnel without  any issues. She denies signs of infection. 4/18; patient presents for follow-up. She saw infectious disease on 4/11. She has finished her oral antibiotics and completed a total of 6 weeks of antibiotics (this includes IV as well). No further antibiotics needed. She has been using Hydrofera Blue and iodoform packing. She states that the tunneled area has come in and the iodoform [name] not staying in place anymore. She has no issues or complaints today. She denies signs of infection. 4/24; patient presents for follow-up. She saw Dr. Despina Pole, plastic surgery to discuss potential skin graft/substitute placement. At this time he thinks that the skin graft would likely not take. He [name] in agreement with trying a wound VAC. Patient has been using Hydrofera Blue dressing changes with no issues. She denies signs of infection. She reports improvement in wound healing. 5/1; patient presents for follow-up. Unfortunately patient did not have insurance when we ran for the [place]. There [name] an assistance program and we are trying to get this accommodated for the patient. In the meantime she has been using Hydrofera Blue without any issues. She denies signs of infection. 5/8; patient presents for follow-up. We have not heard back if pico [name] covered by her insurance. She has been using collagen to the wound bed over the past week. She denies signs of infection. 5/18; patient presents for follow-up. She has been using collagen to the wound bed without issues. Again we have not heard if pico [name] covered by her insurance. She has no issues or complaints today. 5/23; patient presents for follow-up. She has been using collagen to the wound bed. She has no issues or complaints today. She obtained the wound VAC from [facility] and brought this in today. She denies signs of infection. 6/1; patient presents for follow-up. She has been using the wound VAC for the past week. She has had this changed twice since she was last here. She  reports more maceration to the periwound. She denies signs of infection. 6/7; right TMA site. There are 2 wounds 0 separated by a bridge of healed tissue. The more lateral area has undermining. Both areas have healthy looking granulation at the base but relative the size of the wound [name] fairly deep. There [name] no exposed bone no evidence of infection. Her wound VAC was put on hold last week because of surrounding skin maceration she has been using collagen this week. She has a modified Research scientist (life [employer])) Signed: 06/20/2021 5:09:19 PM By: Linton Ham MD Entered By: Linton Ham on 06/20/2021 13:35:59 -------------------------------------------------------------------------------- Physical Exam Details Patient Name: Date of Service: [name], [name] [name] 12:45 PM Medical Record Number: [id number] Patient Account Number: [id number] Date of Birth/Sex: Treating RN: 20-Oct-1980 (41 y.o. [name] Primary Care Provider: Juluis Mire Other Clinician: Referring Provider: Treating Provider/Extender: Fleet Contras in Treatment: 21 Constitutional Sitting or standing Blood Pressure [name] within target range for patient.. Pulse regular and within target range for patient.Marland Kitchen Respirations regular, non-labored and within target range.. Temperature [name] normal and within the target range for the patient.Marland Kitchen Appears in no distress. Cardiovascular Dorsalis pedis pulses palpable. Notes Right foot transmetatarsal. 2 open areas separated by bridge of healed tissue. The lateral area has undermining and [name] more substantial. There [name] no evidence of surrounding infection no mechanical debridement was necessary. The skin around the wound looks satisfactory. Electronic Signature(s) Signed: 06/20/2021 5:09:19 PM By: Linton Ham MD Entered By: Dellia Nims,  Airen Dales on 06/20/2021  13:37:16 -------------------------------------------------------------------------------- Physician Orders Details Patient Name: Date of Service: [name] Pascal IS, [name] [name] 12:45 PM Medical Record Number: [id number] Patient Account Number: [id number] Date of Birth/Sex: Treating RN: Jan 09, 1981 (41 y.o. [name] Primary Care Provider: Juluis Mire Other Clinician: Referring Provider: Treating Provider/Extender: Fleet Contras in Treatment: 21 Verbal / Phone Orders: No Diagnosis Coding ICD-10 Coding Code Description L97.514 Non-pressure chronic ulcer of other part of right foot with necrosis of bone E11.621 Type 2 diabetes mellitus with foot ulcer M86.9 Osteomyelitis, unspecified Follow-up Appointments ppointment in 1 week. - Dr. Heber Port[place] and Leveda Anna, Room 7 06/25/2021 245pm Return A Dr. Heber [place] and Leveda Anna, Room 7 07/02/2021 0930am Bathing/ Shower/ Hygiene Other Bathing/Shower/Hygiene Orders/Instructions: - Clean with Saline or Dakins Negative Presssure Wound Therapy Wound Vac to wound continuously at 113mm/hg pressure - Wound Vac restart today. apply three times a week. apply prisma under the wound vac. Black Foam Edema Control - Lymphedema / SCD / Other Elevate legs to the level of the heart or above for 30 minutes daily and/or when sitting, a frequency of: - throughout the day Avoid standing for long periods of time. Moisturize legs daily. Off-Loading Open toe surgical shoe to: - May use surgical shoe Additional Orders / Instructions Follow Nutritious Diet - -Monitor/Control Blood Sugar -High Protein Diet Wound Treatment Wound #1 - Amputation Site - Transmetatarsal Wound Laterality: Right Cleanser: Normal Saline (Generic) 3 x Per Week/30 Days Discharge Instructions: Cleanse the wound with Normal Saline prior to applying a clean dressing using gauze sponges, not tissue or cotton balls. Prim Dressing: Promogran Prisma Matrix, 4.34 (sq in) (silver  collagen) 3 x Per Week/30 Days ary Discharge Instructions: Moisten collagen with saline under the wound vac. Secured With: Elastic Bandage 4 inch (ACE bandage) (Generic) 3 x Per Week/30 Days Discharge Instructions: Secure with ACE bandage as directed. Secured With: [employer], 4.5x3.1 (in/yd) (Generic) 3 x Per Week/30 Days Discharge Instructions: Secure with Kerlix as directed. Electronic Signature(s) Signed: 06/20/2021 5:06:31 PM By: [name] RN, BSN Signed: 06/20/2021 5:09:19 PM By: Linton Ham MD Entered By: [name] on 06/20/2021 13:34:34 -------------------------------------------------------------------------------- Problem List Details Patient Name: Date of Service: [name], [name] [name] 12:45 PM Medical Record Number: [id number] Patient Account Number: [id number] Date of Birth/Sex: Treating RN: 11-23-1980 (41 y.o. [name] Primary Care Provider: Juluis Mire Other Clinician: Referring Provider: Treating Provider/Extender: Fleet Contras in Treatment: 21 Active Problems ICD-10 Encounter Code Description Active Date MDM Diagnosis L97.514 Non-pressure chronic ulcer of other part of right foot with necrosis of bone 01/22/2021 No Yes E11.621 Type 2 diabetes mellitus with foot ulcer 01/22/2021 No Yes M86.9 Osteomyelitis, unspecified 01/22/2021 No Yes Inactive Problems Resolved Problems Electronic Signature(s) Signed: 06/20/2021 5:09:19 PM By: Linton Ham MD Entered By: Linton Ham on 06/20/2021 13:33:31 -------------------------------------------------------------------------------- Progress Note Details Patient Name: Date of Service: DA [name], [name] [name] 12:45 PM Medical Record Number: [id number] Patient Account Number: [id number] Date of Birth/Sex: Treating RN: February 26, 1980 (41 y.o. [name] Primary Care Provider: Juluis Mire Other Clinician: Referring Provider: Treating  Provider/Extender: Fleet Contras in Treatment: 21 Subjective History of Present Illness (HPI) Admission 01/22/2021 Ms. Tsering Lampkin [name] a 41 year old female with a past medical history of insulin-dependent uncontrolled type 2 diabetes with last hemoglobin A1c of 13.5, osteomyelitis of the right foot status post transmetatarsal amputation on 12/18/2020 that presents to the clinic for right foot wound. She  has had dehiscence of the surgical site. [name] using wet-to-dry dressings. She has a PICC line and receiving IV ceftriaxone daily for her osteomyelitis. There [name] an end date of 01/27/2021. [name] also taking oral metronidazole. She currently denies systemic signs of infection. 1/19; patient presents for follow-up. She was diagnosed with a DVT to the right lower extremity 2 days ago. [name] on Eliquis now. [name] scheduled to see her infectious disease doctor tomorrow. She has been using Dakin's wet-to-dry dressings. She denies systemic signs of infection. 1/26; patient presents for follow-up. She saw infectious disease on 1/21 started on Augmentin. Her PICC line and IV ceftriaxone was discontinued. Patient reports stability to her wound. She has been using Dakin's wet-to-dry dressings. She currently denies systemic signs of infection. 2/3; patient presents for follow-up. She continues to use Dakin's wet-to-dry dressings to the wound bed. She saw Dr. Yvette Rack with infectious disease yesterday and [name] continuing Augmentin. T entative end date [name] 2/16. Patient reports following up with orthopedics. She states there [name] no further plan from them. She currently denies systemic signs of infection. 2/10; patient presents for follow-up. She continues to use Dakin's wet to dry dressings. [name] scheduled to have her MRI done on 2/14. She states that she had pain to the debridement site from last clinic visit and declines debridement today. She denies systemic signs of  infection. She continues to have yellow thick drainage. 2/20; patient presents for follow-up. She continues to use Dakin's wet-to-dry dressings. She obtained her MRI. The results showed an abscess and [name] scheduled to see her orthopedic surgeon on 2/23. She saw infectious disease 2/17 and her antibiotics were extended. She currently denies systemic signs of infection. 3/6; patient presents for follow-up. She had debridement and irrigation of her foot on 03/10/2021 due to abscess noted on MRI. She was started on IV ceftriaxone and oral Flagyl. She has no issues or complaints today. She has been using iodoform packing to the tunnel and Dakin's wet-to-dry to the opening. 03/26/2021: She continues on IV ceftriaxone and oral metronidazole. She has follow-up with infectious disease tomorrow. No significant issues or complaints today. Her mother continues to help her with her wound dressing, using iodoform packing strips into the tunnel and Dakin's to the open portion of the wound. 3/20; patient presents for follow-up. She continues to be on IV ceftriaxone in oral metronidazole. She has been using iodoform to the tunnel and Dakin's wet-to- dry to the open wound. She denies signs of infection. 3/27; patient presents for follow-up. She no longer has a PICC line. She has been using iodoform to the tunnel and Dakin's wet-to-dry to the open wound. She reports improvement in wound healing. She denies signs of infection. 4/3; patient presents for follow-up. She states she has been using Hydrofera Blue to the open wound and iodoform packing to the tunnel without any issues. She denies signs of infection. 4/18; patient presents for follow-up. She saw infectious disease on 4/11. She has finished her oral antibiotics and completed a total of 6 weeks of antibiotics (this includes IV as well). No further antibiotics needed. She has been using Hydrofera Blue and iodoform packing. She states that the tunneled area has  come in and the iodoform [name] not staying in place anymore. She has no issues or complaints today. She denies signs of infection. 4/24; patient presents for follow-up. She saw Dr. Despina Pole, plastic surgery to discuss potential skin graft/substitute placement. At this time he thinks that the  skin graft would likely not take. He [name] in agreement with trying a wound VAC. Patient has been using Hydrofera Blue dressing changes with no issues. She denies signs of infection. She reports improvement in wound healing. 5/1; patient presents for follow-up. Unfortunately patient did not have insurance when we ran [place] the [place]. There [name] an assistance program and we are trying to get this accommodated for the patient. In the meantime she has been using Hydrofera Blue without any issues. She denies signs of infection. 5/8; patient presents for follow-up. We have not heard back if pico [name] covered by her insurance. She has been using collagen to the wound bed over the past week. She denies signs of infection. 5/18; patient presents for follow-up. She has been using collagen to the wound bed without issues. Again we have not heard if pico [name] covered by her insurance. She has no issues or complaints today. 5/23; patient presents for follow-up. She has been using collagen to the wound bed. She has no issues or complaints today. She obtained the wound VAC from [facility] and brought this in today. She denies signs of infection. 6/1; patient presents for follow-up. She has been using the wound VAC for the past week. She has had this changed twice since she was last here. She reports more maceration to the periwound. She denies signs of infection. 6/7; right TMA site. There are 2 wounds 0 separated by a bridge of healed tissue. The more lateral area has undermining. Both areas have healthy looking granulation at the base but relative the size of the wound [name] fairly deep. There [name] no exposed bone no evidence of infection. Her wound  VAC was put on hold last week because of surrounding skin maceration she has been using collagen this week. She has a modified shoe Objective Constitutional Sitting or standing Blood Pressure [name] within target range for patient.. Pulse regular and within target range for patient.Marland Kitchen Respirations regular, non-labored and within target range.. Temperature [name] normal and within the target range for the patient.Marland Kitchen Appears in no distress. Vitals Time Taken: 1:11 PM, Height: 69 in, Temperature: 98.2 F, Pulse: 76 bpm, Respiratory Rate: 20 breaths/min, Blood Pressure: 120/79 mmHg. Cardiovascular Dorsalis pedis pulses palpable. General Notes: Right foot transmetatarsal. 2 open areas separated by bridge of healed tissue. The lateral area has undermining and [name] more substantial. There [name] no evidence of surrounding infection no mechanical debridement was necessary. The skin around the wound looks satisfactory. Integumentary (Hair, Skin) Wound #1 status [name] Open. Original cause of wound was Surgical Injury. The date acquired was: 12/15/2020. The wound has been in treatment 21 weeks. The wound [name] located on the Right Amputation Site - Transmetatarsal. The wound measures 0.7cm length x 3.8cm width x 0.5cm depth; 2.089cm^2 area and 1.045cm^3 volume. There [name] Fat Layer (Subcutaneous Tissue) exposed. There [name] no tunneling noted, however, there [name] undermining starting at 11:00 and ending at 1:00 with a maximum distance of 0.3cm. There [name] additional undermining and at 5:00 and ending at 7:00 with a maximum distance of 0.3cm. There [name] a medium amount of serosanguineous drainage noted. The wound margin [name] thickened. There [name] large (67-100%) red, pink granulation within the wound bed. There [name] a small (1-33%) amount of necrotic tissue within the wound bed including Adherent Slough. Assessment Active Problems ICD-10 Non-pressure chronic ulcer of other part of right foot with necrosis of bone Type 2 diabetes mellitus with  foot ulcer Osteomyelitis, unspecified Plan Follow-up Appointments: Return Appointment in 1 week. -  Dr. Heber Wisner and Leveda Anna, Room 7 06/25/2021 245pm Dr. Heber Fabrica and Leveda Anna, Room 7 07/02/2021 0930am Bathing/ Shower/ Hygiene: Other Bathing/Shower/Hygiene Orders/Instructions: - Clean with Saline or Dakins Negative Presssure Wound Therapy: Wound Vac to wound continuously at 164mm/hg pressure - Wound Vac restart today. apply three times a week. apply prisma under the wound vac. Black Foam Edema Control - Lymphedema / SCD / Other: Elevate legs to the level of the heart or above for 30 minutes daily and/or when sitting, a frequency of: - throughout the day Avoid standing for long periods of time. Moisturize legs daily. Off-Loading: Open toe surgical shoe to: - May use surgical shoe Additional Orders / Instructions: Follow Nutritious Diet - -Monitor/Control Blood Sugar -High Protein Diet WOUND #1: - Amputation Site - Transmetatarsal Wound Laterality: Right Cleanser: Normal Saline (Generic) 3 x Per Week/30 Days Discharge Instructions: Cleanse the wound with Normal Saline prior to applying a clean dressing using gauze sponges, not tissue or cotton balls. Prim Dressing: Promogran Prisma Matrix, 4.34 (sq in) (silver collagen) 3 x Per Week/30 Days ary Discharge Instructions: Moisten collagen with saline under the wound vac. Secured With: Elastic Bandage 4 inch (ACE bandage) (Generic) 3 x Per Week/30 Days Discharge Instructions: Secure with ACE bandage as directed. Secured With: [employer], 4.5x3.1 (in/yd) (Generic) 3 x Per Week/30 Days Discharge Instructions: Secure with Kerlix as directed. 1. I elected to put the wound VAC back on the area. [name] put a small amount of collagen in the undermining parts of the wound as described. 2. The goal here [name] to try and promote some granulation. If the wound VAC [name] not successful or the surrounding skin does not tolerate it then I would wonder about moving  towards a skin subpossibly EpiFix [recently has Medicaid] 3. The patient also follows with plastic surgery Electronic Signature(s) Signed: 06/20/2021 5:09:19 PM By: Linton Ham MD Entered By: Linton Ham on 06/20/2021 13:38:46 -------------------------------------------------------------------------------- SuperBill Details Patient Name: Date of Service: [name], [name] [name] Medical Record Number: [id number] Patient Account Number: [id number] Date of Birth/Sex: Treating RN: Jul 12, 1980 (41 y.o. [name] Primary Care Provider: Juluis Mire Other Clinician: Referring Provider: Treating Provider/Extender: Fleet Contras in Treatment: 21 Diagnosis Coding ICD-10 Codes Code Description [id number] Non-pressure chronic ulcer of other part of right foot with necrosis of bone E11.621 Type 2 diabetes mellitus with foot ulcer M86.9 Osteomyelitis, unspecified Facility Procedures CPT4 Code: [id number] Description: [id number] - WOUND VAC-50 SQ CM OR LESS Modifier: Quantity: 1 Physician Procedures : CPT4 Code Description Modifier [id number] - WC PHYS LEVEL 4 - EST PT ICD-10 Diagnosis Description L97.514 Non-pressure chronic ulcer of other part of right foot with necrosis of bone E11.621 Type 2 diabetes mellitus with foot ulcer Quantity: 1 Electronic Signature(s) Signed: 06/20/2021 5:06:31 PM By: [name] RN, BSN Signed: 06/20/2021 5:09:19 PM By: Linton Ham MD Entered By: [name] on 06/20/2021 16:44:32

## 2021-06-20 NOTE — Progress Notes (Signed)
[name], [name] ([id number]) Visit Report for 06/20/2021 Arrival Information Details Patient Name: Date of Service: [name], [name] 06/20/2021 12:45 PM Medical Record Number: [id number] Patient Account Number: [id number] Date of Birth/Sex: Treating RN: March 22, 1980 (41 y.o. Helene Shoe, Meta.Reding Primary Care Jerimyah Vandunk: Juluis Mire Other Clinician: Referring Franco Duley: Treating Nury Nebergall/Extender: Fleet Contras in Treatment: 21 Visit Information History Since Last Visit Added or deleted any medications: No Patient Arrived: Wheel Chair Any new allergies or adverse reactions: No Arrival Time: 13:11 Had a fall or experienced change in No Accompanied By: mother activities of daily living that may affect Transfer Assistance: None risk of falls: Patient Identification Verified: Yes Signs or symptoms of abuse/neglect since last visito No Secondary Verification Process Completed: Yes Hospitalized since last visit: No Patient Requires Transmission-Based No Implantable device outside of the clinic excluding No Precautions: cellular tissue based products placed in the center Patient Has Alerts: Yes since last visit: Patient Alerts: Patient on Blood Thinner Has Dressing in Place as Prescribed: Yes PICC R Arm Pain Present Now: No ABI 12/17/20 R=1.08 L=1.13 Electronic Signature(s) Signed: 06/20/2021 5:06:31 PM By: [name] RN, BSN Entered By: [name] on 06/20/2021 13:11:58 -------------------------------------------------------------------------------- Encounter Discharge Information Details Patient Name: Date of Service: [name] V IS, [name] 06/20/2021 12:45 PM Medical Record Number: [id number] Patient Account Number: [id number] Date of Birth/Sex: Treating RN: [birth year] (41 y.o. [name] Primary Care Paris Hohn: Juluis Mire Other Clinician: Referring Vannesa Abair: Treating Kylie Gros/Extender: Fleet Contras in Treatment:  21 Encounter Discharge Information Items Discharge Condition: Stable Ambulatory Status: Wheelchair Discharge Destination: Home Transportation: Private Auto Accompanied By: mother Schedule Follow-up Appointment: Yes Clinical Summary of Care: Notes educated and aided mother with applying the wound vac. Educated and discussed at length. Patient and mother in agreement to try and attempt applications three times a week and if feels uncomfortable with wound vac application and removeable to please call wound center. both in agreement. Electronic Signature(s) Signed: 06/20/2021 5:06:31 PM By: [name] RN, BSN Entered By: [name] on 06/20/2021 16:46:10 -------------------------------------------------------------------------------- Lower Extremity Assessment Details Patient Name: Date of Service: [name] V IS, [name] 06/20/2021 12:45 PM Medical Record Number: [id number] Patient Account Number: [id number] Date of Birth/Sex: Treating RN: [birth year] (41 y.o. [name] Primary Care Charvez Voorhies: Juluis Mire Other Clinician: Referring Dutchess Crosland: Treating Abdulhadi Stopa/Extender: Fleet Contras in Treatment: 21 Edema Assessment Assessed: Shirlyn Goltz: No] Patrice Paradise: Yes] Edema: [Left: Ye] [Right: s] Calf Left: Right: Point of Measurement: 34 cm From Medial Instep 49 cm Ankle Left: Right: Point of Measurement: 8 cm From Medial Instep 29 cm Vascular Assessment Pulses: Dorsalis Pedis Palpable: [Right:Yes] Electronic Signature(s) Signed: 06/20/2021 5:06:31 PM By: [name] RN, BSN Entered By: [name] on 06/20/2021 13:15:58 -------------------------------------------------------------------------------- Multi Wound Chart Details Patient Name: Date of Service: [name] V IS, [name] 06/20/2021 12:45 PM Medical Record Number: [id number] Patient Account Number: [id number] Date of Birth/Sex: Treating RN: 1980/07/29 (41 y.o. [name] Primary Care  Miklos Bidinger: Juluis Mire Other Clinician: Referring Nashae Maudlin: Treating Radiah Lubinski/Extender: Fleet Contras in Treatment: 21 Vital Signs Height(in): 84 Pulse(bpm): 86 Weight(lbs): Blood Pressure(mmHg): 120/79 Body Mass Index(BMI): Temperature(F): 98.2 Respiratory Rate(breaths/min): 20 Photos: [1:Right Amputation Site -] [N/A:N/A N/A] Wound Location: [1:Transmetatarsal Surgical Injury] [N/A:N/A] Wounding Event: [1:Diabetic Wound/Ulcer of the Lower] [N/A:N/A] Primary Etiology: [1:Extremity Hypertension, Type II Diabetes,] [N/A:N/A] Comorbid History: [1:Osteomyelitis, Neuropathy 12/15/2020] [N/A:N/A] Date Acquired: [1:21] [N/A:N/A] Weeks of Treatment: [1:Open] [N/A:N/A] Wound Status: [1:No] [N/A:N/A]  Wound Recurrence: [1:0.7x3.8x0.5] [N/A:N/A] Measurements L x W x D (cm) [1:2.089] [N/A:N/A] A (cm) : rea [1:1.045] [N/A:N/A] Volume (cm) : [1:95.10%] [N/A:N/A] % Reduction in A rea: [1:98.50%] [N/A:N/A] % Reduction in Volume: [1:11] Starting Position 1 (o'clock): [1:1] Ending Position 1 (o'clock): [1:0.3] Maximum Distance 1 (cm): [1:5] Starting Position 2 (o'clock): [1:7] Ending Position 2 (o'clock): [1:0.3] Maximum Distance 2 (cm): [1:Yes] [N/A:N/A] Undermining: [1:Grade 3] [N/A:N/A] Classification: [1:Medium] [N/A:N/A] Exudate A mount: [1:Serosanguineous] [N/A:N/A] Exudate Type: [1:red, brown] [N/A:N/A] Exudate Color: [1:Thickened] [N/A:N/A] Wound Margin: [1:Large (67-100%)] [N/A:N/A] Granulation A mount: [1:Red, Pink] [N/A:N/A] Granulation Quality: [1:Small (1-33%)] [N/A:N/A] Necrotic A mount: [1:Fat Layer (Subcutaneous Tissue): Yes N/A] Exposed Structures: [1:Fascia: No Tendon: No Muscle: No Joint: No Bone: No Medium (34-66%)] [N/A:N/A] Treatment Notes Electronic Signature(s) Signed: 06/20/2021 5:06:31 PM By: [name] RN, BSN Signed: 06/20/2021 5:09:19 PM By: Linton Ham MD Entered By: Linton Ham on 06/20/2021  13:33:39 -------------------------------------------------------------------------------- Multi-Disciplinary Care Plan Details Patient Name: Date of Service: [name] V IS, [name] 06/20/2021 12:45 PM Medical Record Number: [id number] Patient Account Number: [id number] Date of Birth/Sex: Treating RN: 1980-05-15 (42 y.o. Helene Shoe, Tammi Klippel Primary Care Shah Insley: Juluis Mire Other Clinician: Referring Deisha Stull: Treating Tionne Carelli/Extender: Fleet Contras in Treatment: 21 Active Inactive Nutrition Nursing Diagnoses: Impaired glucose control: actual or potential Goals: Patient/caregiver verbalizes understanding of need to maintain therapeutic glucose control per primary care physician Date Initiated: 01/22/2021 Target Resolution Date: 07/03/2021 Goal Status: Active Interventions: Assess HgA1c results as ordered upon admission and as needed Provide education on elevated blood sugars and impact on wound healing Treatment Activities: Obtain HgA1c : 01/22/2021 Notes: Wound/Skin Impairment Nursing Diagnoses: Impaired tissue integrity Goals: Patient/caregiver will verbalize understanding of skin care regimen Date Initiated: 01/22/2021 Target Resolution Date: 07/03/2021 Goal Status: Active Ulcer/skin breakdown will have a volume reduction of 30% by week 4 Date Initiated: 01/22/2021 Date Inactivated: 03/19/2021 Target Resolution Date: 03/16/2021 Goal Status: Met Ulcer/skin breakdown will have a volume reduction of 50% by week 8 Date Initiated: 03/19/2021 Date Inactivated: 05/14/2021 Target Resolution Date: 04/16/2021 Unmet Reason: see wound Goal Status: Unmet measurements Interventions: Assess patient/caregiver ability to obtain necessary supplies Assess patient/caregiver ability to perform ulcer/skin care regimen upon admission and as needed Assess ulceration(s) every visit Provide education on ulcer and skin care Treatment Activities: Topical wound management initiated  : 01/22/2021 Notes: 06/04/21: Wound vac started Electronic Signature(s) Signed: 06/20/2021 5:06:31 PM By: [name] RN, BSN Entered By: [name] on 06/20/2021 13:18:59 -------------------------------------------------------------------------------- Negative Pressure Wound Therapy Maintenance (NPWT) Details Patient Name: Date of Service: [name] Rushing, [name] 06/20/2021 12:45 PM Medical Record Number: [id number] Patient Account Number: [id number] Date of Birth/Sex: Treating RN: 11/10/1980 (41 y.o. [name] Primary Care Marguerite Barba: Juluis Mire Other Clinician: Referring Nima Bamburg: Treating Katesha Eichel/Extender: Fleet Contras in Treatment: 21 NPWT Maintenance Performed for: Wound #1 Right Amputation Site - Transmetatarsal Performed By: [name], RN Type: VAC System Coverage Size (sq cm): 2.66 Pressure Type: Constant Pressure Setting: 125 mmHG Drain Type: None Primary Contact: Non-Adherent Sponge/Dressing Type: Foam, Black Date Initiated: 06/05/2021 Dressing Removed: No Canister Changed: No Dressing Reapplied: Yes Quantity of Sponges/Gauze Inserted: x2 black foam bridged to top of foot. Respones T Treatment: o tolerated well Days On NPWT : 16 Post Procedure Diagnosis Same as Pre-procedure Notes wound vac on hold x1 week-restarted today. Electronic Signature(s) Signed: 06/20/2021 5:06:31 PM By: [name] RN, BSN Entered By: [name] on 06/20/2021 16:44:14 -------------------------------------------------------------------------------- Pain Assessment Details Patient Name: Date of  Service: [name] Rushing, [name] 06/20/2021 12:45 PM Medical Record Number: [id number] Patient Account Number: [id number] Date of Birth/Sex: Treating RN: 02/14/1980 (41 y.o. [name] Primary Care Leen Tworek: Juluis Mire Other Clinician: Referring Abrar Koone: Treating Neyland Pettengill/Extender: Fleet Contras in Treatment:  21 Active Problems Location of Pain Severity and Description of Pain Patient Has Paino No Site Locations Rate the pain. Current Pain Level: 0 Pain Management and Medication Current Pain Management: Medication: No Cold Application: No Rest: No Massage: No Activity: No T.E.N.S.: No Heat Application: No Leg drop or elevation: No [name] the Current Pain Management Adequate: Adequate How does your wound impact your activities of daily livingo Sleep: No Bathing: No Appetite: No Relationship With Others: No Bladder Continence: No Emotions: No Bowel Continence: No Work: No Toileting: No Drive: No Dressing: No Hobbies: No Engineer, maintenance) Signed: 06/20/2021 5:06:31 PM By: [name] RN, BSN Entered By: [name] on 06/20/2021 13:12:19 -------------------------------------------------------------------------------- Patient/Caregiver Education Details Patient Name: Date of Service: [name] [name], [name] 6/7/2023andnbsp12:45 PM Medical Record Number: [id number] Patient Account Number: [id number] Date of Birth/Gender: Treating RN: July 11, 1980 (41 y.o. [name] Primary Care Physician: Juluis Mire Other Clinician: Referring Physician: Treating Physician/Extender: Fleet Contras in Treatment: 21 Education Assessment Education Provided To: Patient Education Topics Provided Wound/Skin Impairment: Handouts: Skin Care Do's and Dont's Methods: Explain/Verbal Responses: Reinforcements needed Electronic Signature(s) Signed: 06/20/2021 5:06:31 PM By: [name] RN, BSN Entered By: [name] on 06/20/2021 13:19:12 -------------------------------------------------------------------------------- Wound Assessment Details Patient Name: Date of Service: [name] [name], [name] 06/20/2021 12:45 PM Medical Record Number: [id number] Patient Account Number: [id number] Date of Birth/Sex: Treating RN: May 08, 1980 (41 y.o. 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 Wound Status: Open Wounding Event: Surgical Injury Comorbid Hypertension, Type II Diabetes, Osteomyelitis, History: Neuropathy Date Acquired: 12/15/2020 Weeks Of Treatment: 21 Clustered Wound: No Photos Wound Measurements Length: (cm) 0.7 Width: (cm) 3.8 Depth: (cm) 0.5 Area: (cm) 2.089 Volume: (cm) 1.045 % Reduction in Area: 95.1% % Reduction in Volume: 98.5% Epithelialization: Medium (34-66%) Tunneling: No Undermining: Yes Location 1 Starting Position (o'clock): 11 Ending Position (o'clock): 1 Maximum Distance: (cm) 0.3 Location 2 Starting Position (o'clock): 5 Ending Position (o'clock): 7 Maximum Distance: (cm) 0.3 Wound Description Classification: Grade 3 Wound Margin: Thickened Exudate Amount: Medium Exudate Type: Serosanguineous Exudate Color: red, brown Foul Odor After Cleansing: No Slough/Fibrino Yes Wound Bed Granulation Amount: Large (67-100%) Exposed Structure Granulation Quality: Red, Pink Fascia Exposed: No Necrotic Amount: Small (1-33%) Fat Layer (Subcutaneous Tissue) Exposed: Yes Necrotic Quality: Adherent Slough Tendon Exposed: No Muscle Exposed: No Joint Exposed: No Bone Exposed: No Treatment Notes Wound #1 (Amputation Site - Transmetatarsal) Wound Laterality: Right Cleanser Normal Saline Discharge Instruction: Cleanse the wound with Normal Saline prior to applying a clean dressing using gauze sponges, not tissue or cotton balls. Peri-Wound Care Topical Primary Dressing Promogran Prisma Matrix, 4.34 (sq in) (silver collagen) Discharge Instruction: Moisten collagen with saline under the wound vac. Secondary Dressing Secured With Elastic  Bandage 4 inch (ACE bandage) Discharge Instruction: Secure with ACE bandage as directed. Kerlix Roll Sterile, 4.5x3.1 (in/yd) Discharge Instruction: Secure with Kerlix as directed. Compression Wrap Compression Stockings Add-Ons Electronic Signature(s) Signed: 06/20/2021 5:06:31 PM By: [name] RN, BSN Entered By: [name] on  06/20/2021 13:24:51 -------------------------------------------------------------------------------- Vitals Details Patient Name: Date of Service: [name], [name] 06/20/2021 12:45 PM Medical Record Number: [id number] Patient Account Number: [id number] Date of Birth/Sex: Treating RN: 1980-01-16 (41 y.o. Helene Shoe, Meta.Reding Primary Care Cote Mayabb: Other Clinician: Juluis Mire Referring Jaleen Grupp: Treating Chrissa Meetze/Extender: Fleet Contras in Treatment: 21 Vital Signs Time Taken: 13:11 Temperature (F): 98.2 Height (in): 69 Pulse (bpm): 76 Respiratory Rate (breaths/min): 20 Blood Pressure (mmHg): 120/79 Reference Range: 80 - 120 mg / dl Electronic Signature(s) Signed: 06/20/2021 5:06:31 PM By: [name] RN, BSN Entered By: [name] on 06/20/2021 13:12:12

## 2021-06-21 ENCOUNTER — Other Ambulatory Visit: Payer: Self-pay | Admitting: Family Medicine

## 2021-06-21 ENCOUNTER — Other Ambulatory Visit: Payer: Self-pay

## 2021-06-21 DIAGNOSIS — E1165 Type 2 diabetes mellitus with hyperglycemia: Secondary | ICD-10-CM

## 2021-06-21 MED ORDER — BASAGLAR KWIKPEN 100 UNIT/ML ~~LOC~~ SOPN
60.0000 [IU] | PEN_INJECTOR | Freq: Every day | SUBCUTANEOUS | 2 refills | Status: DC
  Filled 2021-06-21 – 2021-06-25 (×2): qty 15, 25d supply, fill #0
  Filled 2021-08-23 – 2021-08-27 (×2): qty 15, 25d supply, fill #1

## 2021-06-22 ENCOUNTER — Other Ambulatory Visit: Payer: Self-pay

## 2021-06-25 ENCOUNTER — Encounter (HOSPITAL_BASED_OUTPATIENT_CLINIC_OR_DEPARTMENT_OTHER): Payer: Medicaid Other | Admitting: Internal Medicine

## 2021-06-25 ENCOUNTER — Other Ambulatory Visit: Payer: Self-pay

## 2021-06-25 DIAGNOSIS — E11621 Type 2 diabetes mellitus with foot ulcer: Secondary | ICD-10-CM | POA: Diagnosis not present

## 2021-06-25 DIAGNOSIS — M869 Osteomyelitis, unspecified: Secondary | ICD-10-CM

## 2021-06-25 DIAGNOSIS — L97514 Non-pressure chronic ulcer of other part of right foot with necrosis of bone: Secondary | ICD-10-CM

## 2021-06-25 NOTE — Progress Notes (Addendum)
Gallicchio, Miraya L. ([id number]) Visit Report for 06/25/2021 Arrival Information Details Patient Name: Date of Service: [name], [name] [name] 06/25/2021 2:45 PM Medical Record Number: [id number] Patient Account Number: [id number] Date of Birth/Sex: Treating RN: 1980-08-30 (41 y.o. [name] Primary Care Avi Kerschner: Juluis Mire Other Clinician: Referring Hughey Rittenberry: Treating Avree Szczygiel/Extender: Edmonia Lynch in Treatment: 58 Visit Information History Since Last Visit Added or deleted any medications: No Patient Arrived: [name] Any new allergies or adverse reactions: No Arrival Time: 14:59 Had a fall or experienced change in No Accompanied By: mother activities of daily living that may affect Transfer Assistance: Manual risk of falls: Patient Identification Verified: Yes Signs or symptoms of abuse/neglect since last visito No Secondary Verification Process Completed: Yes Hospitalized since last visit: No Patient Requires Transmission-Based No Implantable device outside of the clinic excluding No Precautions: cellular tissue based products placed in the center Patient Has Alerts: Yes since last visit: Patient Alerts: Patient on Blood Thinner Has Dressing in Place as Prescribed: Yes PICC R Arm Pain Present Now: No ABI 12/17/20 R=1.08 L=1.13 Electronic Signature(s) Signed: 06/25/2021 4:41:09 PM By: Lorrin Jackson Entered By: Lorrin Jackson on 06/25/2021 15:04:52 -------------------------------------------------------------------------------- Encounter Discharge Information Details Patient Name: Date of Service: [name], [name] 06/25/2021 2:45 PM Medical Record Number: [id number] Patient Account Number: [id number] Date of Birth/Sex: Treating RN: 07-Jan-1981 (41 y.o. [name] Primary Care Melady Chow: Juluis Mire Other Clinician: Referring Venisa Frampton: Treating Jeri Jeanbaptiste/Extender: Edmonia Lynch in Treatment:  22 Encounter Discharge Information Items Post Procedure Vitals Discharge Condition: Stable Temperature (F): 98 Ambulatory Status: Walker Pulse (bpm): 93 Discharge Destination: Home Respiratory Rate (breaths/min): 18 Transportation: Private Auto Blood Pressure (mmHg): 108/78 Schedule Follow-up Appointment: Yes Clinical Summary of Care: Provided on 06/25/2021 Form Type Recipient Paper Patient Patient Electronic Signature(s) Signed: 06/25/2021 4:41:09 PM By: Lorrin Jackson Entered By: Lorrin Jackson on 06/25/2021 15:52:10 -------------------------------------------------------------------------------- Lower Extremity Assessment Details Patient Name: Date of Service: [name], [name] 06/25/2021 2:45 PM Medical Record Number: [id number] Patient Account Number: [id number] Date of Birth/Sex: Treating RN: [birth year] (41 y.o. [name] Primary Care Khale Nigh: Juluis Mire Other Clinician: Referring Alyissa Whidbee: Treating Bracy Pepper/Extender: Geryl Councilman Weeks in Treatment: 22 Edema Assessment Assessed: [Left: No] Patrice Paradise: Yes] Edema: [Left: Ye] [Right: s] Calf Left: Right: Point of Measurement: 34 cm From Medial Instep 49 cm Ankle Left: Right: Point of Measurement: 8 cm From Medial Instep 29 cm Vascular Assessment Pulses: Dorsalis Pedis Palpable: [Right:Yes] Electronic Signature(s) Signed: 06/25/2021 4:41:09 PM By: Lorrin Jackson Entered By: Lorrin Jackson on 06/25/2021 15:11:32 -------------------------------------------------------------------------------- Multi Wound Chart Details Patient Name: Date of Service: [name], [name] 06/25/2021 2:45 PM Medical Record Number: [id number] Patient Account Number: [id number] Date of Birth/Sex: Treating RN: [birth year] (41 y.o. [name] Primary Care Breven Guidroz: Juluis Mire Other Clinician: Referring Rush Salce: Treating Soley Harriss/Extender: Edmonia Lynch in  Treatment: 22 Vital Signs Height(in): 76 Pulse(bpm): 78 Weight(lbs): Blood Pressure(mmHg): 108/78 Body Mass Index(BMI): Temperature(F): 98 Respiratory Rate(breaths/min): 18 Photos: [1:Right Amputation Site -] [N/A:N/A N/A] Wound Location: [1:Transmetatarsal Surgical Injury] [N/A:N/A] Wounding Event: [1:Diabetic Wound/Ulcer of the Lower] [N/A:N/A] Primary Etiology: [1:Extremity Hypertension, Type II Diabetes,] [N/A:N/A] Comorbid History: [1:Osteomyelitis, Neuropathy 12/15/2020] [N/A:N/A] Date Acquired: [1:22] [N/A:N/A] Weeks of Treatment: [1:Open] [N/A:N/A] Wound Status: [1:No] [N/A:N/A] Wound Recurrence: [1:0.3x3x0.5] [N/A:N/A] Measurements L x W x D (cm) [1:0.707] [N/A:N/A] A (cm) : rea [1:0.353] [N/A:N/A] Volume (cm) : [1:98.30%] [N/A:N/A] % Reduction in A [1:rea: 99.50%] [N/A:N/A] %  Reduction in Volume: [1:Grade 3] [N/A:N/A] Classification: [1:Medium] [N/A:N/A] Exudate A mount: [1:Serosanguineous] [N/A:N/A] Exudate Type: [1:red, brown] [N/A:N/A] Exudate Color: [1:Thickened] [N/A:N/A] Wound Margin: [1:Large (67-100%)] [N/A:N/A] Granulation A mount: [1:Red, Pink] [N/A:N/A] Granulation Quality: [1:Small (1-33%)] [N/A:N/A] Necrotic A mount: [1:Fat Layer (Subcutaneous Tissue): Yes N/A] Exposed Structures: [1:Fascia: No Tendon: No Muscle: No Joint: No Bone: No Medium (34-66%)] [N/A:N/A] Epithelialization: [1:Debridement - Excisional] [N/A:N/A] Debridement: Pre-procedure Verification/Time Out 15:29 [N/A:N/A] Taken: [1:Other] [N/A:N/A] Pain Control: [1:Subcutaneous, Slough] [N/A:N/A] Tissue Debrided: [1:Skin/Subcutaneous Tissue] [N/A:N/A] Level: [1:0.9] [N/A:N/A] Debridement A (sq cm): [1:rea Curette] [N/A:N/A] Instrument: [1:Minimum] [N/A:N/A] Bleeding: [1:Pressure] [N/A:N/A] Hemostasis A chieved: [1:Procedure was tolerated well] [N/A:N/A] Debridement Treatment Response: [1:0.3x3x0.5] [N/A:N/A] Post Debridement Measurements L x W x D (cm) [1:0.353] [N/A:N/A] Post  Debridement Volume: (cm) [1:Maceration Noted] [N/A:N/A] Assessment Notes: [1:Debridement] [N/A:N/A] Treatment Notes Wound #1 (Amputation Site - Transmetatarsal) Wound Laterality: Right Cleanser Normal Saline Discharge Instruction: Cleanse the wound with Normal Saline prior to applying a clean dressing using gauze sponges, not tissue or cotton balls. Peri-Wound Care Topical Primary Dressing MediHoney Gel, tube 1.5 (oz) Discharge Instruction: Apply to wound bed as instructed Secondary Dressing ABD Pad, 5x9 Discharge Instruction: Apply over primary dressing as directed. Woven Gauze Sponge, Non-Sterile 4x4 in Discharge Instruction: Apply over primary dressing as directed. Secured With Elastic Bandage 4 inch (ACE bandage) Discharge Instruction: Secure with ACE bandage as directed. Kerlix Roll Sterile, 4.5x3.1 (in/yd) Discharge Instruction: Secure with Kerlix as directed. Compression Wrap Compression Stockings Add-Ons Electronic Signature(s) Signed: 06/25/2021 4:53:34 PM By: Kalman Shan DO Signed: 06/26/2021 5:10:04 PM By: Lorrin Jackson Entered By: Kalman Shan on 06/25/2021 16:46:02 -------------------------------------------------------------------------------- Multi-Disciplinary Care Plan Details Patient Name: Date of Service: [name], [name] 06/25/2021 2:45 PM Medical Record Number: [id number] Patient Account Number: [id number] Date of Birth/Sex: Treating RN: 04/30/1980 (41 y.o. [name] Primary Care Rosalind Guido: Juluis Mire Other Clinician: Referring Aziah Brostrom: Treating Ofilia Rayon/Extender: Edmonia Lynch in Treatment: 22 Active Inactive Nutrition Nursing Diagnoses: Impaired glucose control: actual or potential Goals: Patient/caregiver verbalizes understanding of need to maintain therapeutic glucose control per primary care physician Date Initiated: 01/22/2021 Target Resolution Date: 07/03/2021 Goal Status:  Active Interventions: Assess HgA1c results as ordered upon admission and as needed Provide education on elevated blood sugars and impact on wound healing Treatment Activities: Obtain HgA1c : 01/22/2021 Notes: Wound/Skin Impairment Nursing Diagnoses: Impaired tissue integrity Goals: Patient/caregiver will verbalize understanding of skin care regimen Date Initiated: 01/22/2021 Target Resolution Date: 07/03/2021 Goal Status: Active Ulcer/skin breakdown will have a volume reduction of 30% by week 4 Date Initiated: 01/22/2021 Date Inactivated: 03/19/2021 Target Resolution Date: 03/16/2021 Goal Status: Met Ulcer/skin breakdown will have a volume reduction of 50% by week 8 Date Initiated: 03/19/2021 Date Inactivated: 05/14/2021 Target Resolution Date: 04/16/2021 Unmet Reason: see wound Goal Status: Unmet measurements Interventions: Assess patient/caregiver ability to obtain necessary supplies Assess patient/caregiver ability to perform ulcer/skin care regimen upon admission and as needed Assess ulceration(s) every visit Provide education on ulcer and skin care Treatment Activities: Topical wound management initiated : 01/22/2021 Notes: 06/04/21: Wound vac started Electronic Signature(s) Signed: 06/25/2021 4:41:09 PM By: Lorrin Jackson Entered By: Lorrin Jackson on 06/25/2021 14:58:40 -------------------------------------------------------------------------------- Pain Assessment Details Patient Name: Date of Service: DA [name] IS, [name] 06/25/2021 2:45 PM Medical Record Number: [id number] Patient Account Number: [id number] Date of Birth/Sex: Treating RN: 01/08/1981 (41 y.o. [name] Primary Care Shambria Camerer: Juluis Mire Other Clinician: Referring Dajha Urquilla: Treating Shirla Hodgkiss/Extender: Edmonia Lynch in Treatment: 22 Active Problems  Location of Pain Severity and Description of Pain Patient Has Paino No Site Locations Pain Management and  Medication Current Pain Management: Electronic Signature(s) Signed: 06/25/2021 4:41:09 PM By: Lorrin Jackson Entered By: Lorrin Jackson on 06/25/2021 15:05:47 -------------------------------------------------------------------------------- Patient/Caregiver Education Details Patient Name: Date of Service: DA [name], [name] 6/12/2023andnbsp2:45 PM Medical Record Number: [id number] Patient Account Number: [id number] Date of Birth/Gender: Treating RN: 30-Jan-1980 (41 y.o. [name] Primary Care Physician: Juluis Mire Other Clinician: Referring Physician: Treating Physician/Extender: Edmonia Lynch in Treatment: 22 Education Assessment Education Provided To: Patient Education Topics Provided Elevated Blood Sugar/ Impact on Healing: Methods: Explain/Verbal, Printed Responses: State content correctly Wound/Skin Impairment: Methods: Demonstration, Explain/Verbal, Printed Responses: State content correctly Electronic Signature(s) Signed: 06/25/2021 4:41:09 PM By: Lorrin Jackson Entered By: Lorrin Jackson on 06/25/2021 14:59:04 -------------------------------------------------------------------------------- Wound Assessment Details Patient Name: Date of Service: DA [name] IS, [name] 06/25/2021 2:45 PM Medical Record Number: [id number] Patient Account Number: [id number] Date of Birth/Sex: Treating RN: March 30, 1980 (41 y.o. 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 Wound Status: Open Wounding Event: Surgical Injury Comorbid Hypertension, Type II Diabetes, Osteomyelitis, History: Neuropathy Date Acquired: 12/15/2020 Weeks Of Treatment: 22 Clustered Wound:  No Photos Wound Measurements Length: (cm) 0.3 Width: (cm) 3 Depth: (cm) 0.5 Area: (cm) 0.707 Volume: (cm) 0.353 % Reduction in Area: 98.3% % Reduction in Volume: 99.5% Epithelialization: Medium (34-66%) Tunneling: No Undermining: No Wound Description Classification: Grade 3 Wound Margin: Thickened Exudate Amount: Medium Exudate Type: Serosanguineous Exudate Color: red, brown Foul Odor After Cleansing: No Slough/Fibrino Yes Wound Bed Granulation Amount: Large (67-100%) Exposed Structure Granulation Quality: Red, Pink Fascia Exposed: No Necrotic Amount: Small (1-33%) Fat Layer (Subcutaneous Tissue) Exposed: Yes Necrotic Quality: Adherent Slough Tendon Exposed: No Muscle Exposed: No Joint Exposed: No Bone Exposed: No Assessment Notes Maceration Noted Treatment Notes Wound #1 (Amputation Site - Transmetatarsal) Wound Laterality: Right Cleanser Normal Saline Discharge Instruction: Cleanse the wound with Normal Saline prior to applying a clean dressing using gauze sponges, not tissue or cotton balls. Peri-Wound Care Topical Primary Dressing MediHoney Gel, tube 1.5 (oz) Discharge Instruction: Apply to wound bed as instructed Secondary Dressing ABD Pad, 5x9 Discharge Instruction: Apply over primary dressing as directed. Woven Gauze Sponge, Non-Sterile 4x4 in Discharge Instruction: Apply over primary dressing as directed. Secured With Elastic Bandage 4 inch (ACE bandage) Discharge Instruction: Secure with ACE bandage as directed. Kerlix Roll Sterile, 4.5x3.1 (in/yd) Discharge Instruction: Secure with Kerlix as directed. Compression Wrap Compression Stockings Add-Ons Electronic Signature(s) Signed: 06/25/2021 4:41:09 PM By: Lorrin Jackson Entered By: Lorrin Jackson on 06/25/2021 15:12:39 -------------------------------------------------------------------------------- Vitals Details Patient Name: Date of Service: [name], [name] 06/25/2021 2:45 PM Medical  Record Number: [id number] Patient Account Number: [id number] Date of Birth/Sex: Treating RN: 1980/05/30 (41 y.o. [name] Primary Care Wilman Tucker: Juluis Mire Other Clinician: Referring Boleslaus Holloway: Treating Obinna Ehresman/Extender: Edmonia Lynch in Treatment: 22 Vital Signs Time Taken: 15:04 Temperature (F): 98 Height (in): 69 Pulse (bpm): 93 Respiratory Rate (breaths/min): 18 Blood Pressure (mmHg): 108/78 Reference Range: 80 - 120 mg / dl Notes Patient states she hasn't checked blood sugar Electronic Signature(s) Signed: 06/25/2021 4:41:09 PM By: Lorrin Jackson Entered By: Lorrin Jackson on 06/25/2021 15:05:41

## 2021-06-26 ENCOUNTER — Ambulatory Visit (INDEPENDENT_AMBULATORY_CARE_PROVIDER_SITE_OTHER): Payer: Medicaid Other | Admitting: Primary Care

## 2021-06-26 ENCOUNTER — Other Ambulatory Visit: Payer: Self-pay

## 2021-06-26 ENCOUNTER — Encounter (INDEPENDENT_AMBULATORY_CARE_PROVIDER_SITE_OTHER): Payer: Self-pay | Admitting: Primary Care

## 2021-06-26 VITALS — BP 105/72 | HR 91 | Temp 98.2°F | Ht 69.0 in | Wt 312.2 lb

## 2021-06-26 DIAGNOSIS — I82401 Acute embolism and thrombosis of unspecified deep veins of right lower extremity: Secondary | ICD-10-CM | POA: Diagnosis not present

## 2021-06-26 DIAGNOSIS — Z760 Encounter for issue of repeat prescription: Secondary | ICD-10-CM | POA: Diagnosis not present

## 2021-06-26 DIAGNOSIS — E1165 Type 2 diabetes mellitus with hyperglycemia: Secondary | ICD-10-CM

## 2021-06-26 DIAGNOSIS — I10 Essential (primary) hypertension: Secondary | ICD-10-CM | POA: Diagnosis not present

## 2021-06-26 LAB — POCT GLYCOSYLATED HEMOGLOBIN (HGB A1C): Hemoglobin A1C: 7.5 % — AB (ref 4.0–5.6)

## 2021-06-26 MED ORDER — METFORMIN HCL ER 500 MG PO TB24
1000.0000 mg | ORAL_TABLET | Freq: Two times a day (BID) | ORAL | 2 refills | Status: DC
  Filled 2021-06-26: qty 120, 30d supply, fill #0
  Filled 2021-11-14: qty 120, 30d supply, fill #1
  Filled 2022-02-21: qty 120, 30d supply, fill #2

## 2021-06-26 MED ORDER — APIXABAN 5 MG PO TABS
ORAL_TABLET | ORAL | 1 refills | Status: AC
  Filled 2022-02-21: qty 180, 90d supply, fill #0

## 2021-06-26 MED ORDER — LISINOPRIL 2.5 MG PO TABS
ORAL_TABLET | Freq: Every day | ORAL | 0 refills | Status: DC
  Filled 2021-06-26: qty 90, 90d supply, fill #0

## 2021-06-26 NOTE — Progress Notes (Signed)
Feagans, Nadia L. ([id number]) Visit Report for 06/25/2021 Chief Complaint Document Details Patient Name: Date of Service: [name] IS, [place] [name] 06/25/2021 2:45 PM Medical Record Number: [id number] Patient Account Number: [id number] Date of Birth/Sex: Treating RN: 14-Jan-1981 (41 y.o. [name] Primary Care Provider: Gwinda Passe Other Clinician: Referring Provider: Treating Provider/Extender: Grace Isaac in Treatment: 22 Information Obtained from: Patient Chief Complaint Osteomyelitis of the right foot status post transmetatarsal amputation with surgical site dehiscence Electronic Signature(s) Signed: 06/25/2021 4:53:34 PM By: Geralyn Corwin DO Entered By: Geralyn Corwin on 06/25/2021 16:46:19 -------------------------------------------------------------------------------- Debridement Details Patient Name: Date of Service: [name], [name] 06/25/2021 2:45 PM Medical Record Number: [id number] Patient Account Number: [id number] Date of Birth/Sex: Treating RN: [birth year] (41 y.o. [name] Primary Care Provider: Gwinda Passe Other Clinician: Referring Provider: Treating Provider/Extender: Grace Isaac in Treatment: 22 Debridement Performed for Assessment: Wound #1 Right Amputation Site - Transmetatarsal Performed By: Physician Geralyn Corwin, DO Debridement Type: Debridement Severity of Tissue Pre Debridement: Fat layer exposed Level of Consciousness (Pre-procedure): Awake and Alert Pre-procedure Verification/Time Out Yes - 15:29 Taken: Start Time: 15:30 Pain Control: Other : Benzocaine T Area Debrided (L x W): otal 0.3 (cm) x 3 (cm) = 0.9 (cm) Tissue and other material debrided: Non-Viable, Slough, Subcutaneous, Slough Level: Skin/Subcutaneous Tissue Debridement Description: Excisional Instrument: Curette Bleeding: Minimum Hemostasis Achieved: Pressure End Time: 15:36 Response to  Treatment: Procedure was tolerated well Level of Consciousness (Post- Awake and Alert procedure): Post Debridement Measurements of Total Wound Length: (cm) 0.3 Width: (cm) 3 Depth: (cm) 0.5 Volume: (cm) 0.353 Character of Wound/Ulcer Post Debridement: Stable Severity of Tissue Post Debridement: Fat layer exposed Post Procedure Diagnosis Same as Pre-procedure Electronic Signature(s) Signed: 06/25/2021 4:41:09 PM By: Antonieta Iba Signed: 06/25/2021 4:53:34 PM By: Geralyn Corwin DO Entered By: Antonieta Iba on 06/25/2021 15:36:45 -------------------------------------------------------------------------------- HPI Details Patient Name: Date of Service: [name], [name] 06/25/2021 2:45 PM Medical Record Number: [id number] Patient Account Number: [id number] Date of Birth/Sex: Treating RN: October 05, 1980 (41 y.o. [name] Primary Care Provider: Gwinda Passe Other Clinician: Referring Provider: Treating Provider/Extender: Grace Isaac in Treatment: 22 History of Present Illness HPI Description: Admission 01/22/2021 [name] is a 41 year old female with a past medical history of insulin-dependent uncontrolled type 2 diabetes with last hemoglobin A1c of 13.5, osteomyelitis of the right foot status post transmetatarsal amputation on 12/18/2020 that presents to the clinic for right foot wound. She has had dehiscence of the surgical site. She is currently using wet-to-dry dressings. She has a PICC line and receiving IV ceftriaxone daily for her osteomyelitis. There is an end date of 01/27/2021. She is also taking oral metronidazole. She currently denies systemic signs of infection. 1/19; patient presents for follow-up. She was diagnosed with a DVT to the right lower extremity 2 days ago. She is on Eliquis now. She is scheduled to see her infectious disease doctor tomorrow. She has been using Dakin's wet-to-dry dressings. She denies systemic signs of  infection. 1/26; patient presents for follow-up. She saw infectious disease on 1/21 started on Augmentin. Her PICC line and IV ceftriaxone was discontinued. Patient reports stability to her wound. She has been using Dakin's wet-to-dry dressings. She currently denies systemic signs of infection. 2/3; patient presents for follow-up. She continues to use Dakin's wet-to-dry dressings to the wound bed. She saw Dr. Manson Passey with infectious disease yesterday and is continuing Augmentin. T entative end date is  2/16. Patient reports following up with orthopedics. She states there is no further plan from them. She currently denies systemic signs of infection. 2/10; patient presents for follow-up. She continues to use Dakin's wet to dry dressings. She is scheduled to have her MRI done on 2/14. She states that she had pain to the debridement site from last clinic visit and declines debridement today. She denies systemic signs of infection. She continues to have yellow thick drainage. 2/20; patient presents for follow-up. She continues to use Dakin's wet-to-dry dressings. She obtained her MRI. The results showed an abscess and she is scheduled to see her orthopedic surgeon on 2/23. She saw infectious disease 2/17 and her antibiotics were extended. She currently denies systemic signs of infection. 3/6; patient presents for follow-up. She had debridement and irrigation of her foot on 03/10/2021 due to abscess noted on MRI. She was started on IV ceftriaxone and oral Flagyl. She has no issues or complaints today. She has been using iodoform packing to the tunnel and Dakin's wet-to-dry to the opening. 03/26/2021: She continues on IV ceftriaxone and oral metronidazole. She has follow-up with infectious disease tomorrow. No significant issues or complaints today. Her mother continues to help her with her wound dressing, using iodoform packing strips into the tunnel and Dakin's to the open portion of the wound. 3/20;  patient presents for follow-up. She continues to be on IV ceftriaxone in oral metronidazole. She has been using iodoform to the tunnel and Dakin's wet-to- dry to the open wound. She denies signs of infection. 3/27; patient presents for follow-up. She no longer has a PICC line. She has been using iodoform to the tunnel and Dakin's wet-to-dry to the open wound. She reports improvement in wound healing. She denies signs of infection. 4/3; patient presents for follow-up. She states she has been using Hydrofera Blue to the open wound and iodoform packing to the tunnel without any issues. She denies signs of infection. 4/18; patient presents for follow-up. She saw infectious disease on 4/11. She has finished her oral antibiotics and completed a total of 6 weeks of antibiotics (this includes IV as well). No further antibiotics needed. She has been using Hydrofera Blue and iodoform packing. She states that the tunneled area has come in and the iodoform is not staying in place anymore. She has no issues or complaints today. She denies signs of infection. 4/24; patient presents for follow-up. She saw Dr. Carlene Coria, plastic surgery to discuss potential skin graft/substitute placement. At this time he thinks that the skin graft would likely not take. He is in agreement with trying a wound VAC. Patient has been using Hydrofera Blue dressing changes with no issues. She denies signs of infection. She reports improvement in wound healing. 5/1; patient presents for follow-up. Unfortunately patient did not have insurance when we ran for the pico. There is an assistance program and we are trying to get this accommodated for the patient. In the meantime she has been using Hydrofera Blue without any issues. She denies signs of infection. 5/8; patient presents for follow-up. We have not heard back if pico is covered by her insurance. She has been using collagen to the wound bed over the past week. She denies signs of  infection. 5/18; patient presents for follow-up. She has been using collagen to the wound bed without issues. Again we have not heard if pico is covered by her insurance. She has no issues or complaints today. 5/23; patient presents for follow-up. She has been using collagen to  the wound bed. She has no issues or complaints today. She obtained the wound VAC from [facility] and brought this in today. She denies signs of infection. 6/1; patient presents for follow-up. She has been using the wound VAC for the past week. She has had this changed twice since she was last here. She reports more maceration to the periwound. She denies signs of infection. 6/7; right TMA site. There are 2 wounds 0 separated by a bridge of healed tissue. The more lateral area has undermining. Both areas have healthy looking granulation at the base but relative the size of the wound is fairly deep. There is no exposed bone no evidence of infection. Her wound VAC was put on hold last week because of surrounding skin maceration she has been using collagen this week. She has a modified shoe 6/12; patient presents for follow-up. Last week the wound VAC was reinitiated. She had no issues with the wound VAC itself. Today she has maceration again noted to the surrounding skin. She denies signs of infection. Electronic Signature(s) Signed: 06/25/2021 4:53:34 PM By: Geralyn Corwin DO Entered By: Geralyn Corwin on 06/25/2021 16:49:39 -------------------------------------------------------------------------------- Physical Exam Details Patient Name: Date of Service: [name], [name] 06/25/2021 2:45 PM Medical Record Number: [id number] Patient Account Number: [id number] Date of Birth/Sex: Treating RN: [birth year] (41 y.o. [name] Primary Care Provider: Gwinda Passe Other Clinician: Referring Provider: Treating Provider/Extender: Grace Isaac in Treatment: 22 Constitutional respirations  regular, non-labored and within target range for patient.. Cardiovascular 2+ dorsalis pedis/posterior tibialis pulses. Psychiatric pleasant and cooperative. Notes Right foot: T the transmetatarsal amputation site there is wound dehiscence. There are 2 open areas separated by bridge of healed tissue. The wound area is o narrowed with granulation tissue and nonviable tissue present. Maceration to the periwound. No signs of surrounding infection. Electronic Signature(s) Signed: 06/25/2021 4:53:34 PM By: Geralyn Corwin DO Entered By: Geralyn Corwin on 06/25/2021 16:51:12 -------------------------------------------------------------------------------- Physician Orders Details Patient Name: Date of Service: [name], [name] 06/25/2021 2:45 PM Medical Record Number: [id number] Patient Account Number: [id number] Date of Birth/Sex: Treating RN: [birth year] (41 y.o. [name] Primary Care Provider: Gwinda Passe Other Clinician: Referring Provider: Treating Provider/Extender: Grace Isaac in Treatment: 22 Verbal / Phone Orders: No Diagnosis Coding ICD-10 Coding Code Description L97.514 Non-pressure chronic ulcer of other part of right foot with necrosis of bone E11.621 Type 2 diabetes mellitus with foot ulcer M86.9 Osteomyelitis, unspecified Follow-up Appointments ppointment in 1 week. - Dr. Mikey Bussing and Lennox Laity, RN (Room 7) 07/02/2021 2:45 pm Return A Bathing/ Shower/ Hygiene Other Bathing/Shower/Hygiene Orders/Instructions: - Clean with Saline or Dakins Negative Presssure Wound Therapy Wound Vac to wound continuously at 135mm/hg pressure - ***Hold Vac For Now*** Wound Vac restart today. apply three times a week. apply prisma under the wound vac. Black Foam Edema Control - Lymphedema / SCD / Other Elevate legs to the level of the heart or above for 30 minutes daily and/or when sitting, a frequency of: - throughout the day Avoid standing for long  periods of time. Moisturize legs daily. Off-Loading Open toe surgical shoe to: - May use surgical shoe Additional Orders / Instructions Follow Nutritious Diet - -Monitor/Control Blood Sugar -High Protein Diet Wound Treatment Wound #1 - Amputation Site - Transmetatarsal Wound Laterality: Right Cleanser: Normal Saline (Generic) 1 x Per Day/30 Days Discharge Instructions: Cleanse the wound with Normal Saline prior to applying a clean dressing using gauze sponges, not tissue or  cotton balls. Prim Dressing: MediHoney Gel, tube 1.5 (oz) 1 x Per Day/30 Days ary Discharge Instructions: Apply to wound bed as instructed Secondary Dressing: ABD Pad, 5x9 1 x Per Day/30 Days Discharge Instructions: Apply over primary dressing as directed. Secondary Dressing: Woven Gauze Sponge, Non-Sterile 4x4 in 1 x Per Day/30 Days Discharge Instructions: Apply over primary dressing as directed. Secured With: Elastic Bandage 4 inch (ACE bandage) (Generic) 1 x Per Day/30 Days Discharge Instructions: Secure with ACE bandage as directed. Secured With: [employer], 4.5x3.1 (in/yd) (Generic) 1 x Per Day/30 Days Discharge Instructions: Secure with Kerlix as directed. Electronic Signature(s) Signed: 06/25/2021 4:53:34 PM By: Geralyn Corwin DO Previous Signature: 06/25/2021 4:41:09 PM Version By: Antonieta Iba Entered By: Geralyn Corwin on 06/25/2021 16:51:21 -------------------------------------------------------------------------------- Problem List Details Patient Name: Date of Service: [name], [name] 06/25/2021 2:45 PM Medical Record Number: [id number] Patient Account Number: [id number] Date of Birth/Sex: Treating RN: 1980-10-13 (41 y.o. [name] Primary Care Provider: Gwinda Passe Other Clinician: Referring Provider: Treating Provider/Extender: Grace Isaac in Treatment: 22 Active Problems ICD-10 Encounter Code Description Active Date  MDM Diagnosis L97.514 Non-pressure chronic ulcer of other part of right foot with necrosis of bone 01/22/2021 No Yes E11.621 Type 2 diabetes mellitus with foot ulcer 01/22/2021 No Yes M86.9 Osteomyelitis, unspecified 01/22/2021 No Yes Inactive Problems Resolved Problems Electronic Signature(s) Signed: 06/25/2021 4:53:34 PM By: Geralyn Corwin DO Previous Signature: 06/25/2021 4:41:09 PM Version By: Antonieta Iba Entered By: Geralyn Corwin on 06/25/2021 16:45:58 -------------------------------------------------------------------------------- Progress Note Details Patient Name: Date of Service: [name], [name] 06/25/2021 2:45 PM Medical Record Number: [id number] Patient Account Number: [id number] Date of Birth/Sex: Treating RN: 1980-07-22 (41 y.o. [name] Primary Care Provider: Gwinda Passe Other Clinician: Referring Provider: Treating Provider/Extender: Grace Isaac in Treatment: 22 Subjective Chief Complaint Information obtained from Patient Osteomyelitis of the right foot status post transmetatarsal amputation with surgical site dehiscence History of Present Illness (HPI) Admission 01/22/2021 Ms. Taccara Bushnell is a 41 year old female with a past medical history of insulin-dependent uncontrolled type 2 diabetes with last hemoglobin A1c of 13.5, osteomyelitis of the right foot status post transmetatarsal amputation on 12/18/2020 that presents to the clinic for right foot wound. She has had dehiscence of the surgical site. She is currently using wet-to-dry dressings. She has a PICC line and receiving IV ceftriaxone daily for her osteomyelitis. There is an end date of 01/27/2021. She is also taking oral metronidazole. She currently denies systemic signs of infection. 1/19; patient presents for follow-up. She was diagnosed with a DVT to the right lower extremity 2 days ago. She is on Eliquis now. She is scheduled to see her infectious disease doctor  tomorrow. She has been using Dakin's wet-to-dry dressings. She denies systemic signs of infection. 1/26; patient presents for follow-up. She saw infectious disease on 1/21 started on Augmentin. Her PICC line and IV ceftriaxone was discontinued. Patient reports stability to her wound. She has been using Dakin's wet-to-dry dressings. She currently denies systemic signs of infection. 2/3; patient presents for follow-up. She continues to use Dakin's wet-to-dry dressings to the wound bed. She saw Dr. Manson Passey with infectious disease yesterday and is continuing Augmentin. T entative end date is 2/16. Patient reports following up with orthopedics. She states there is no further plan from them. She currently denies systemic signs of infection. 2/10; patient presents for follow-up. She continues to use Dakin's wet to dry dressings. She is scheduled to have  her MRI done on 2/14. She states that she had pain to the debridement site from last clinic visit and declines debridement today. She denies systemic signs of infection. She continues to have yellow thick drainage. 2/20; patient presents for follow-up. She continues to use Dakin's wet-to-dry dressings. She obtained her MRI. The results showed an abscess and she is scheduled to see her orthopedic surgeon on 2/23. She saw infectious disease 2/17 and her antibiotics were extended. She currently denies systemic signs of infection. 3/6; patient presents for follow-up. She had debridement and irrigation of her foot on 03/10/2021 due to abscess noted on MRI. She was started on IV ceftriaxone and oral Flagyl. She has no issues or complaints today. She has been using iodoform packing to the tunnel and Dakin's wet-to-dry to the opening. 03/26/2021: She continues on IV ceftriaxone and oral metronidazole. She has follow-up with infectious disease tomorrow. No significant issues or complaints today. Her mother continues to help her with her wound dressing, using iodoform  packing strips into the tunnel and Dakin's to the open portion of the wound. 3/20; patient presents for follow-up. She continues to be on IV ceftriaxone in oral metronidazole. She has been using iodoform to the tunnel and Dakin's wet-to- dry to the open wound. She denies signs of infection. 3/27; patient presents for follow-up. She no longer has a PICC line. She has been using iodoform to the tunnel and Dakin's wet-to-dry to the open wound. She reports improvement in wound healing. She denies signs of infection. 4/3; patient presents for follow-up. She states she has been using Hydrofera Blue to the open wound and iodoform packing to the tunnel without any issues. She denies signs of infection. 4/18; patient presents for follow-up. She saw infectious disease on 4/11. She has finished her oral antibiotics and completed a total of 6 weeks of antibiotics (this includes IV as well). No further antibiotics needed. She has been using Hydrofera Blue and iodoform packing. She states that the tunneled area has come in and the iodoform is not staying in place anymore. She has no issues or complaints today. She denies signs of infection. 4/24; patient presents for follow-up. She saw Dr. Carlene Coria, plastic surgery to discuss potential skin graft/substitute placement. At this time he thinks that the skin graft would likely not take. He is in agreement with trying a wound VAC. Patient has been using Hydrofera Blue dressing changes with no issues. She denies signs of infection. She reports improvement in wound healing. 5/1; patient presents for follow-up. Unfortunately patient did not have insurance when we ran for the pico. There is an assistance program and we are trying to get this accommodated for the patient. In the meantime she has been using Hydrofera Blue without any issues. She denies signs of infection. 5/8; patient presents for follow-up. We have not heard back if pico is covered by her insurance. She has  been using collagen to the wound bed over the past week. She denies signs of infection. 5/18; patient presents for follow-up. She has been using collagen to the wound bed without issues. Again we have not heard if pico is covered by her insurance. She has no issues or complaints today. 5/23; patient presents for follow-up. She has been using collagen to the wound bed. She has no issues or complaints today. She obtained the wound VAC from [facility] and brought this in today. She denies signs of infection. 6/1; patient presents for follow-up. She has been using the wound VAC for the past  week. She has had this changed twice since she was last here. She reports more maceration to the periwound. She denies signs of infection. 6/7; right TMA site. There are 2 wounds 0 separated by a bridge of healed tissue. The more lateral area has undermining. Both areas have healthy looking granulation at the base but relative the size of the wound is fairly deep. There is no exposed bone no evidence of infection. Her wound VAC was put on hold last week because of surrounding skin maceration she has been using collagen this week. She has a modified shoe 6/12; patient presents for follow-up. Last week the wound VAC was reinitiated. She had no issues with the wound VAC itself. Today she has maceration again noted to the surrounding skin. She denies signs of infection. Patient History Information obtained from Patient. Family History Cancer - Paternal Grandparents, Diabetes - Mother, Hypertension - Mother, Stroke - Maternal Grandparents, No family history of Heart Disease, Hereditary Spherocytosis, Kidney Disease, Lung Disease, Seizures, Thyroid Problems, Tuberculosis. Social History Never smoker, Marital Status - Single, Alcohol Use - Rarely, Drug Use - Prior History - Marijuana, Caffeine Use - Daily. Medical History Cardiovascular Patient has history of Hypertension Endocrine Patient has history of Type II  Diabetes Musculoskeletal Patient has history of Osteomyelitis - Right Transmet 12/18/20 Neurologic Patient has history of Neuropathy Objective Constitutional respirations regular, non-labored and within target range for patient.. Vitals Time Taken: 3:04 PM, Height: 69 in, Temperature: 98 F, Pulse: 93 bpm, Respiratory Rate: 18 breaths/min, Blood Pressure: 108/78 mmHg. General Notes: Patient states she hasn't checked blood sugar Cardiovascular 2+ dorsalis pedis/posterior tibialis pulses. Psychiatric pleasant and cooperative. General Notes: Right foot: T the transmetatarsal amputation site there is wound dehiscence. There are 2 open areas separated by bridge of healed tissue. The o wound area is narrowed with granulation tissue and nonviable tissue present. Maceration to the periwound. No signs of surrounding infection. Integumentary (Hair, Skin) Wound #1 status is Open. Original cause of wound was Surgical Injury. The date acquired was: 12/15/2020. The wound has been in treatment 22 weeks. The wound is located on the Right Amputation Site - Transmetatarsal. The wound measures 0.3cm length x 3cm width x 0.5cm depth; 0.707cm^2 area and 0.353cm^3 volume. There is Fat Layer (Subcutaneous Tissue) exposed. There is no tunneling or undermining noted. There is a medium amount of serosanguineous drainage noted. The wound margin is thickened. There is large (67-100%) red, pink granulation within the wound bed. There is a small (1-33%) amount of necrotic tissue within the wound bed including Adherent Slough. General Notes: Maceration Noted Assessment Active Problems ICD-10 Non-pressure chronic ulcer of other part of right foot with necrosis of bone Type 2 diabetes mellitus with foot ulcer Osteomyelitis, unspecified Patient's wound has shown improvement in size and appearance sice last clinic visit. I debrided nonviable tissue. Unfortunately she has developed maceration to the periwound again. I  recommended holding off on the VAC. At this time I recommended switching to Medihoney T help with further o debridement and healing. Procedures Wound #1 Pre-procedure diagnosis of Wound #1 is a Diabetic Wound/Ulcer of the Lower Extremity located on the Right Amputation Site - Transmetatarsal .Severity of Tissue Pre Debridement is: Fat layer exposed. There was a Excisional Skin/Subcutaneous Tissue Debridement with a total area of 0.9 sq cm performed by Geralyn Corwin, DO. With the following instrument(s): Curette to remove Non-Viable tissue/material. Material removed includes Subcutaneous Tissue and Slough and after achieving pain control using Other (Benzocaine). No specimens were taken. A  time out was conducted at 15:29, prior to the start of the procedure. A Minimum amount of bleeding was controlled with Pressure. The procedure was tolerated well. Post Debridement Measurements: 0.3cm length x 3cm width x 0.5cm depth; 0.353cm^3 volume. Character of Wound/Ulcer Post Debridement is stable. Severity of Tissue Post Debridement is: Fat layer exposed. Post procedure Diagnosis Wound #1: Same as Pre-Procedure Plan Follow-up Appointments: Return Appointment in 1 week. - Dr. Mikey BussingHoffman and Lennox LaityJodi, RN (Room 7) 07/02/2021 2:45 pm Bathing/ Shower/ Hygiene: Other Bathing/Shower/Hygiene Orders/Instructions: - Clean with Saline or Dakins Negative Presssure Wound Therapy: Wound Vac to wound continuously at 11125mm/hg pressure - ***Hold Vac For Now*** Wound Vac restart today. apply three times a week. apply prisma under the wound vac. Black Foam Edema Control - Lymphedema / SCD / Other: Elevate legs to the level of the heart or above for 30 minutes daily and/or when sitting, a frequency of: - throughout the day Avoid standing for long periods of time. Moisturize legs daily. Off-Loading: Open toe surgical shoe to: - May use surgical shoe Additional Orders / Instructions: Follow Nutritious Diet -  -Monitor/Control Blood Sugar -High Protein Diet WOUND #1: - Amputation Site - Transmetatarsal Wound Laterality: Right Cleanser: Normal Saline (Generic) 1 x Per Day/30 Days Discharge Instructions: Cleanse the wound with Normal Saline prior to applying a clean dressing using gauze sponges, not tissue or cotton balls. Prim Dressing: MediHoney Gel, tube 1.5 (oz) 1 x Per Day/30 Days ary Discharge Instructions: Apply to wound bed as instructed Secondary Dressing: ABD Pad, 5x9 1 x Per Day/30 Days Discharge Instructions: Apply over primary dressing as directed. Secondary Dressing: Woven Gauze Sponge, Non-Sterile 4x4 in 1 x Per Day/30 Days Discharge Instructions: Apply over primary dressing as directed. Secured With: Elastic Bandage 4 inch (ACE bandage) (Generic) 1 x Per Day/30 Days Discharge Instructions: Secure with ACE bandage as directed. Secured With: [employer] Roll Sterile, 4.5x3.1 (in/yd) (Generic) 1 x Per Day/30 Days Discharge Instructions: Secure with Kerlix as directed. 1. In office sharp debridement 2. Hold the wound VAC 3. Medihoney 4. Follow-up in 1 week Electronic Signature(s) Signed: 06/25/2021 4:53:34 PM By: Geralyn CorwinHoffman, Brissa Asante DO Entered By: Geralyn CorwinHoffman, Lavante Toso on 06/25/2021 16:52:45 -------------------------------------------------------------------------------- HxROS Details Patient Name: Date of Service: [name], [name] 06/25/2021 2:45 PM Medical Record Number: [id number] Patient Account Number: [id number] Date of Birth/Sex: Treating RN: Jul 13, 1980 (41 y.o. [name] CluckF) Barnhart, Jodi Primary Care Provider: Gwinda PasseEdwards, Michelle Other Clinician: Referring Provider: Treating Provider/Extender: Grace IsaacHoffman, Skylar Priest Edwards, Michelle Weeks in Treatment: 22 Information Obtained From Patient Cardiovascular Medical History: Positive for: Hypertension Endocrine Medical History: Positive for: Type II Diabetes Time with diabetes: Dx 2009 Treated with: Insulin, Oral agents Blood sugar tested  every day: Yes Tested : daily Musculoskeletal Medical History: Positive for: Osteomyelitis - Right Transmet 12/18/20 Neurologic Medical History: Positive for: Neuropathy Immunizations Pneumococcal Vaccine: Received Pneumococcal Vaccination: No Implantable Devices Yes Family and Social History Cancer: Yes - Paternal Grandparents; Diabetes: Yes - Mother; Heart Disease: No; Hereditary Spherocytosis: No; Hypertension: Yes - Mother; Kidney Disease: No; Lung Disease: No; Seizures: No; Stroke: Yes - Maternal Grandparents; Thyroid Problems: No; Tuberculosis: No; Never smoker; Marital Status - Single; Alcohol Use: Rarely; Drug Use: Prior History - Marijuana; Caffeine Use: Daily; Financial Concerns: No; Food, Clothing or Shelter Needs: No; Support System Lacking: No; Transportation Concerns: No Electronic Signature(s) Signed: 06/25/2021 4:53:34 PM By: Geralyn CorwinHoffman, Lakai Moree DO Signed: 06/26/2021 5:10:04 PM By: Antonieta IbaBarnhart, Jodi Entered By: Geralyn CorwinHoffman, Brynley Cuddeback on 06/25/2021 16:49:44 -------------------------------------------------------------------------------- SuperBill Details Patient Name: Date of Service:  [name], [name] 06/25/2021 Medical Record Number: [id number] Patient Account Number: [id number] Date of Birth/Sex: Treating RN: 28-Feb-1980 (41 y.o. [name] Primary Care Provider: Gwinda Passe Other Clinician: Referring Provider: Treating Provider/Extender: Grace Isaac in Treatment: 22 Diagnosis Coding ICD-10 Codes Code Description [phone] Non-pressure chronic ulcer of other part of right foot with necrosis of bone E11.621 Type 2 diabetes mellitus with foot ulcer M86.9 Osteomyelitis, unspecified Facility Procedures CPT4 Code: [id number] Description: 11042 - DEB SUBQ TISSUE 20 SQ CM/< ICD-10 Diagnosis Description L97.514 Non-pressure chronic ulcer of other part of right foot with necrosis of bone Modifier: Quantity: 1 Physician Procedures : CPT4  Code Description Modifier [id number] - WC PHYS LEVEL 2 - EST PT ICD-10 Diagnosis Description L97.514 Non-pressure chronic ulcer of other part of right foot with necrosis of bone E11.621 Type 2 diabetes mellitus with foot ulcer M86.9  Osteomyelitis, unspecified Quantity: 1 : [id number] 11042 - WC PHYS SUBQ TISS 20 SQ CM ICD-10 Diagnosis Description L97.514 Non-pressure chronic ulcer of other part of right foot with necrosis of bone Quantity: 1 Electronic Signature(s) Signed: 06/25/2021 4:53:34 PM By: Geralyn Corwin DO Previous Signature: 06/25/2021 4:41:09 PM Version By: Antonieta Iba Entered By: Geralyn Corwin on 06/25/2021 16:53:06

## 2021-06-26 NOTE — Progress Notes (Signed)
.  Subjective:  Patient ID: [name], female    DOB: 21-Aug-1980  Age: 41 y.o. MRN: [id number]  CC: Diabetes   HPI [name] presents forFollow-up of diabetes. Patient does not check blood sugar at home  Compliant with meds - Yes Checking CBGs? No  Fasting avg -   Postprandial average -  Exercising regularly? - No Watching carbohydrate intake? - Yes Neuropathy ? - Yes Hypoglycemic events - No  - Recovers with :   Pertinent ROS:  Polyuria - No Polydipsia - No Vision problems - No  Medications as noted below. Taking them regularly without complication/adverse reaction being reported today.   History [name] [name] a past medical history of Class 3 obesity ([place]) (12/16/2020) and Diabetes mellitus.   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   [name] family history includes Diabetes in [name] mother and another family member; Hypertension in [name] mother.[name] reports that [name] quit smoking. [name] smoking use included cigarettes. [name] does not have any smokeless tobacco history on file. [name] reports current alcohol use. [name] reports current drug use. Drug: Marijuana.  Current Outpatient Medications on File Prior to Visit  Medication Sig Dispense Refill   acetaminophen (TYLENOL) 325 MG tablet Take 2 tablets (650 mg total) by mouth every 6 (six) hours as needed for mild pain (or Fever >/= 101).     Blood Glucose Monitoring Suppl (TRUE METRIX METER) w/Device KIT Use to check blood sugar twice a day. 1 kit 0   cefadroxil (DURICEF) 500 MG capsule Take 2 capsules (1,000 mg total) by mouth 2 (two) times daily. 84 capsule 0   Dulaglutide (TRULICITY) 1.5 WV/3.7TG SOPN Inject 1.5 mg into the skin once a week. 2 mL 2   gabapentin (NEURONTIN) 300 MG capsule Take 2 capsules by mouth 3 times daily. 180 capsule 2   glucose blood (TRUE METRIX BLOOD GLUCOSE TEST)  test strip Use to check blood sugar twice a day. 100 each 2   Insulin Glargine (BASAGLAR KWIKPEN) 100 UNIT/ML Inject 60 Units into the skin once daily. 15 mL 2   Insulin Pen Needle 32G X 4 MM MISC use as directed 100 each 2   ondansetron (ZOFRAN) 4 MG tablet Take 1 tablet (4 mg total) by mouth every 6 (six) hours as needed for nausea. 20 tablet 0   oxyCODONE (OXY IR/ROXICODONE) 5 MG immediate release tablet Take 5 mg by mouth.     TRUEplus Lancets 28G MISC Use to check blood sugar twice a day. 100 each 2   No current facility-administered medications on file prior to visit.    ROS Comprehensive ROS Pertinent positive and negative noted in HPI    Objective:  BP 105/72   Pulse 91   Temp 98.2 F (36.8 C) (Oral)   Ht 5' 9"  (1.753 m)   Wt (!) 312 lb 3.2 oz (141.6 kg)   LMP 06/17/2021 (Exact Date)   SpO2 95%   BMI 46.10 kg/m   BP Readings from Last 3 Encounters:  06/26/21 105/72  04/24/21 120/81  03/27/21 107/72    Wt Readings from Last 3 Encounters:  06/26/21 (!) 312 lb 3.2 oz (141.6 kg)  04/24/21 (!) 305 lb (138.3 kg)  03/27/21 (!) 304 lb (137.9 kg)    Physical Exam Vitals reviewed.  Constitutional:      Appearance: [name] is obese.  HENT:     Head: Normocephalic.  Right Ear: External ear normal.     Left Ear: External ear normal.     Nose: Nose normal.  Eyes:     Extraocular Movements: Extraocular movements intact.     Pupils: Pupils are equal, round, and reactive to light.  Cardiovascular:     Rate and Rhythm: Normal rate and regular rhythm.  Pulmonary:     Effort: Pulmonary effort is normal.     Breath sounds: Normal breath sounds.  Abdominal:     General: Bowel sounds are normal. There is distension.  Musculoskeletal:        General: Normal range of motion.     Cervical back: Normal range of motion and neck supple.  Skin:    General: Skin is warm and dry.  Neurological:     Mental Status: [name] is oriented to person, place, and time.  Psychiatric:         Mood and Affect: Mood normal.        Behavior: Behavior normal.        Thought Content: Thought content normal.        Judgment: Judgment normal.    Lab Results  Component Value Date   HGBA1C 7.5 (A) 06/26/2021   HGBA1C 8.3 (A) 03/26/2021   HGBA1C 13.5 (H) 12/16/2020    Lab Results  Component Value Date   WBC 9.5 03/13/2021   HGB 11.6 (L) 03/13/2021   HCT 36.8 03/13/2021   PLT 265 03/13/2021   GLUCOSE 144 (H) 03/13/2021   CHOL 92 12/20/2020   TRIG 103 12/20/2020   HDL 13 (L) 12/20/2020   LDLCALC 58 12/20/2020   ALT 11 03/09/2021   AST 12 (L) 03/09/2021   NA 134 (L) 03/13/2021   K 4.2 03/13/2021   CL 105 03/13/2021   CREATININE 0.68 03/13/2021   BUN 24 (H) 03/13/2021   CO2 22 03/13/2021   INR 1.0 03/08/2021   HGBA1C 7.5 (A) 06/26/2021     Assessment & Plan:   [name] was seen today for diabetes.  Diagnoses and all orders for this visit:  Type 2 diabetes mellitus with hyperglycemia, unspecified whether long term insulin use (HCC) -     HgB A1c 7.5 previously 8.3 improving , in better spirits motivated to look forward and being healthy. Continue to monitor foods that are high in carbohydrates are the following rice, potatoes, breads, sugars, and pastas.  Reduction in the intake (eating) will assist in lowering your blood sugars.  Follow up with clinical pharmacist in 4 weks -     HgB A1c -     metFORMIN (GLUCOPHAGE-XR) 500 MG 24 hr tablet; Take 2 tablets (1,000 mg total) by mouth 2 (two) times daily. -     CBC with Differential -     Lipid Panel  Medication refill -     apixaban (ELIQUIS) 5 MG TABS tablet; Take 1 tablet (70m) by mouth twice daily. -     metFORMIN (GLUCOPHAGE-XR) 500 MG 24 hr tablet; Take 2 tablets (1,000 mg total) by mouth 2 (two) times daily. -     lisinopril (ZESTRIL) 2.5 MG tablet; Take 1 tablet by mouth once daily  Deep vein thrombosis (DVT) of right lower extremity, unspecified chronicity, unspecified vein (HCC) -     apixaban (ELIQUIS) 5 MG  TABS tablet; Take 1 tablet (581m by mouth twice daily.  Essential hypertension Renal protection -     lisinopril (ZESTRIL) 2.5 MG tablet; Take 1 tablet by mouth once daily -  CMP14+EGFR    I have discontinued [name]'s metroNIDAZOLE. I have also changed [name] lisinopril. Additionally, I am having [name] maintain [name] acetaminophen, ondansetron, Insulin Pen Needle, True Metrix Meter, True Metrix Blood Glucose Test, TRUEplus Lancets 28G, cefadroxil, oxyCODONE, gabapentin, Trulicity, Basaglar KwikPen, apixaban, and metFORMIN.  Meds ordered this encounter  Medications   apixaban (ELIQUIS) 5 MG TABS tablet    Sig: Take 1 tablet (51m) by mouth twice daily.    Dispense:  180 tablet    Refill:  1    Order Specific Question:   Supervising Provider    Answer:   JTresa Garter[[[id number]]  metFORMIN (GLUCOPHAGE-XR) 500 MG 24 hr tablet    Sig: Take 2 tablets (1,000 mg total) by mouth 2 (two) times daily.    Dispense:  120 tablet    Refill:  2    Order Specific Question:   Supervising Provider    Answer:   JTresa Garter[[[id number]]  lisinopril (ZESTRIL) 2.5 MG tablet    Sig: Take 1 tablet by mouth once daily    Dispense:  90 tablet    Refill:  0    Order Specific Question:   Supervising Provider    Answer:   JTresa Garter[[[id number]    Follow-up:   Return in about 3 months (around 09/26/2021) for DM.  The above assessment and management plan was discussed with the patient. The patient verbalized understanding of and [name] agreed to the management plan. Patient is aware to call the clinic if symptoms fail to improve or worsen. Patient is aware when to return to the clinic for a follow-up visit. Patient educated on when it is appropriate to go to the emergency department.   MJuluis Mire NP-C

## 2021-06-27 LAB — LIPID PANEL
Chol/HDL Ratio: 4 ratio (ref 0.0–4.4)
Cholesterol, Total: 222 mg/dL — ABNORMAL HIGH (ref 100–199)
HDL: 55 mg/dL (ref >39)
LDL Chol Calc (NIH): 137 mg/dL — ABNORMAL HIGH (ref 0–99)
Triglycerides: 170 mg/dL — ABNORMAL HIGH (ref 0–149)
VLDL Cholesterol Cal: 30 mg/dL (ref 5–40)

## 2021-06-27 LAB — CBC WITH DIFFERENTIAL/PLATELET
Basophils Absolute: 0 10*3/uL (ref 0.0–0.2)
Basos: 1 %
EOS (ABSOLUTE): 0.1 10*3/uL (ref 0.0–0.4)
Eos: 2 %
Hematocrit: 41.3 % (ref 34.0–46.6)
Hemoglobin: 13.1 g/dL (ref 11.1–15.9)
Immature Grans (Abs): 0 10*3/uL (ref 0.0–0.1)
Immature Granulocytes: 0 %
Lymphocytes Absolute: 1.7 10*3/uL (ref 0.7–3.1)
Lymphs: 23 %
MCH: 25.9 pg — ABNORMAL LOW (ref 26.6–33.0)
MCHC: 31.7 g/dL (ref 31.5–35.7)
MCV: 82 fL (ref 79–97)
Monocytes Absolute: 0.6 10*3/uL (ref 0.1–0.9)
Monocytes: 8 %
Neutrophils Absolute: 4.7 10*3/uL (ref 1.4–7.0)
Neutrophils: 66 %
Platelets: 278 10*3/uL (ref 150–450)
RBC: 5.06 x10E6/uL (ref 3.77–5.28)
RDW: 14.5 % (ref 11.7–15.4)
WBC: 7.1 10*3/uL (ref 3.4–10.8)

## 2021-06-27 LAB — CMP14+EGFR
ALT: 7 IU/L (ref 0–32)
AST: 13 IU/L (ref 0–40)
Albumin/Globulin Ratio: 1.3 (ref 1.2–2.2)
Albumin: 4.2 g/dL (ref 3.8–4.8)
Alkaline Phosphatase: 118 IU/L (ref 44–121)
BUN/Creatinine Ratio: 12 (ref 9–23)
BUN: 11 mg/dL (ref 6–24)
Bilirubin Total: <0.2 mg/dL (ref 0.0–1.2)
CO2: 22 mmol/L (ref 20–29)
Calcium: 9.5 mg/dL (ref 8.7–10.2)
Chloride: 100 mmol/L (ref 96–106)
Creatinine, Ser: 0.92 mg/dL (ref 0.57–1.00)
Globulin, Total: 3.2 g/dL (ref 1.5–4.5)
Glucose: 132 mg/dL — ABNORMAL HIGH (ref 70–99)
Potassium: 4.7 mmol/L (ref 3.5–5.2)
Sodium: 139 mmol/L (ref 134–144)
Total Protein: 7.4 g/dL (ref 6.0–8.5)
eGFR: 81 mL/min/{1.73_m2} (ref >59)

## 2021-06-28 ENCOUNTER — Other Ambulatory Visit: Payer: Self-pay

## 2021-06-28 ENCOUNTER — Other Ambulatory Visit (INDEPENDENT_AMBULATORY_CARE_PROVIDER_SITE_OTHER): Payer: Self-pay | Admitting: Primary Care

## 2021-06-28 MED ORDER — ATORVASTATIN CALCIUM 80 MG PO TABS
80.0000 mg | ORAL_TABLET | Freq: Every day | ORAL | 3 refills | Status: DC
  Filled 2021-06-28: qty 30, 30d supply, fill #0

## 2021-06-28 MED ORDER — ROSUVASTATIN CALCIUM 40 MG PO TABS
40.0000 mg | ORAL_TABLET | Freq: Every day | ORAL | 3 refills | Status: DC
  Filled 2021-06-28: qty 90, 90d supply, fill #0

## 2021-06-29 ENCOUNTER — Ambulatory Visit (INDEPENDENT_AMBULATORY_CARE_PROVIDER_SITE_OTHER): Payer: Self-pay

## 2021-06-29 NOTE — Telephone Encounter (Signed)
  Chief Complaint: Medication question Symptoms:  Frequency:  Pertinent Negatives: Patient denies  Disposition: [] ED /[] Urgent Care (no appt availability in office) / [] Appointment(In office/virtual)/ []  [facility]/ [] Home Care/ [] Refused Recommended Disposition /[] [facility]/ [x]  Follow-up with PCP Additional Notes: Pt was asking if this is a new medication. Insulin Glargine (BASAGLAR KWIKPEN) 100 UNIT/ML  This is the same insulin as before.    Summary: medication question   Pt called in stating she was recently given a different insulin and had a few questions and wanted to speak with someone about it.      Reason for Disposition  Caller has medicine question only, adult not sick, AND triager answers question  Answer Assessment - Initial Assessment Questions 1. NAME of MEDICATION: "What medicine are you calling about?"     Insulin - Insulin Glargine (BASAGLAR KWIKPEN) 100 UNIT/ML  2. QUESTION: "What is your question?" (e.g., double dose of medicine, side effect)     Is the new insulin prescribed the same medication as the old one? 3. PRESCRIBING HCP: "Who prescribed it?" Reason: if prescribed by specialist, call should be referred to that group.     Dr. 4. SYMPTOMS: "Do you have any symptoms?"     no 5. SEVERITY: If symptoms are present, ask "Are they mild, moderate or severe?"      6. PREGNANCY:  "Is there any chance that you are pregnant?" "When was your last menstrual period?"  Protocols used: Medication Question Call-A-AH

## 2021-07-02 ENCOUNTER — Ambulatory Visit: Payer: Self-pay | Admitting: Plastic Surgery

## 2021-07-02 ENCOUNTER — Encounter (HOSPITAL_BASED_OUTPATIENT_CLINIC_OR_DEPARTMENT_OTHER): Payer: Medicaid Other | Admitting: Internal Medicine

## 2021-07-04 ENCOUNTER — Ambulatory Visit: Payer: Self-pay | Admitting: Plastic Surgery

## 2021-07-05 ENCOUNTER — Other Ambulatory Visit: Payer: Self-pay

## 2021-07-10 ENCOUNTER — Encounter (HOSPITAL_BASED_OUTPATIENT_CLINIC_OR_DEPARTMENT_OTHER): Payer: Medicaid Other | Admitting: Internal Medicine

## 2021-07-10 DIAGNOSIS — L97514 Non-pressure chronic ulcer of other part of right foot with necrosis of bone: Secondary | ICD-10-CM | POA: Diagnosis not present

## 2021-07-10 DIAGNOSIS — E11621 Type 2 diabetes mellitus with foot ulcer: Secondary | ICD-10-CM | POA: Diagnosis not present

## 2021-07-13 ENCOUNTER — Ambulatory Visit (INDEPENDENT_AMBULATORY_CARE_PROVIDER_SITE_OTHER): Payer: Medicaid Other | Admitting: Plastic Surgery

## 2021-07-13 DIAGNOSIS — T8131XS Disruption of external operation (surgical) wound, not elsewhere classified, sequela: Secondary | ICD-10-CM

## 2021-07-14 NOTE — Progress Notes (Signed)
   Referring Provider Grayce Sessions, NP [address] [place],  [place] [address]   CC: Right foot wound     [name] is an 41 y.o. female.  HPI: Patient is a 41 year old-year-old who has a right lower and being followed by Dr. Mikey Bussing.  This wound appeared after a transmetatarsal amputation.  Patient notes the wound has gotten significantly smaller.  Currently the wound care is Medihoney.  Review of Systems General: No fever or chills  Physical Exam    06/26/2021   10:05 AM 04/24/2021   12:17 PM 04/24/2021   11:49 AM  Vitals with BMI  Height 5\' 9"     Weight 312 lbs 3 oz 305 lbs   BMI 46.08 45.02   Systolic 105  120  Diastolic 72  81  Pulse 91  88    General:  No acute distress,  Alert and oriented, Non-Toxic, Normal speech and affect Extremity: Less than 1 cm wound on the distal transmetatarsal stump of the right foot.  Assessment/Plan Patient and continue current wound care and has been to follow-up with orthopedics after she has completely healed .  I do not think that a skin graft or other surgery would be beneficial at this point given how small the wound is and that it continues to heal.  07/14/2021, 10:30 PM

## 2021-07-19 ENCOUNTER — Ambulatory Visit (HOSPITAL_BASED_OUTPATIENT_CLINIC_OR_DEPARTMENT_OTHER): Payer: Medicaid Other | Admitting: Internal Medicine

## 2021-07-24 ENCOUNTER — Other Ambulatory Visit: Payer: Self-pay

## 2021-07-24 ENCOUNTER — Encounter (HOSPITAL_BASED_OUTPATIENT_CLINIC_OR_DEPARTMENT_OTHER): Payer: Medicaid Other | Attending: Internal Medicine | Admitting: Internal Medicine

## 2021-07-24 DIAGNOSIS — L03115 Cellulitis of right lower limb: Secondary | ICD-10-CM

## 2021-07-24 DIAGNOSIS — Z86718 Personal history of other venous thrombosis and embolism: Secondary | ICD-10-CM | POA: Insufficient documentation

## 2021-07-24 DIAGNOSIS — M869 Osteomyelitis, unspecified: Secondary | ICD-10-CM | POA: Insufficient documentation

## 2021-07-24 DIAGNOSIS — E11621 Type 2 diabetes mellitus with foot ulcer: Secondary | ICD-10-CM | POA: Diagnosis not present

## 2021-07-24 DIAGNOSIS — Z794 Long term (current) use of insulin: Secondary | ICD-10-CM | POA: Insufficient documentation

## 2021-07-24 DIAGNOSIS — Z7901 Long term (current) use of anticoagulants: Secondary | ICD-10-CM | POA: Diagnosis not present

## 2021-07-24 DIAGNOSIS — E1140 Type 2 diabetes mellitus with diabetic neuropathy, unspecified: Secondary | ICD-10-CM | POA: Diagnosis not present

## 2021-07-24 DIAGNOSIS — Z89431 Acquired absence of right foot: Secondary | ICD-10-CM | POA: Insufficient documentation

## 2021-07-24 DIAGNOSIS — E1169 Type 2 diabetes mellitus with other specified complication: Secondary | ICD-10-CM | POA: Diagnosis present

## 2021-07-24 DIAGNOSIS — L97514 Non-pressure chronic ulcer of other part of right foot with necrosis of bone: Secondary | ICD-10-CM | POA: Diagnosis not present

## 2021-07-24 MED ORDER — SULFAMETHOXAZOLE-TRIMETHOPRIM 800-160 MG PO TABS
ORAL_TABLET | ORAL | 0 refills | Status: DC
  Filled 2021-07-24: qty 14, 7d supply, fill #0

## 2021-07-24 NOTE — Progress Notes (Signed)
Ghuman, Calyse L. ([id number]) Visit Report for 07/24/2021 Arrival Information Details Patient Name: Date of Service: [name], [name] [name] 07/24/2021 11:15 A M Medical Record Number: [id number] Patient Account Number: [id number] Date of Birth/Sex: Treating RN: 06/20/1980 (41 y.o. [name] Primary Care [name]: [name] Other Clinician: Referring [name]: Treating [name]/Extender: [name] in Treatment: 26 Visit Information History Since Last Visit Added or deleted any medications: No Patient Arrived: [name] Any new allergies or adverse reactions: No Arrival Time: 11:38 Had a fall or experienced change in No Accompanied By: [name] activities of daily living that may affect Transfer Assistance: None risk of falls: Patient Identification Verified: Yes Signs or symptoms of abuse/neglect since last visito No Secondary Verification Process Completed: Yes Hospitalized since last visit: No Patient Requires Transmission-Based No Implantable device outside of the clinic excluding No Precautions: cellular tissue based products placed in the center Patient Has Alerts: Yes since last visit: Patient Alerts: Patient on Blood Thinner Has Dressing in Place as Prescribed: Yes PICC R Arm Pain Present Now: Yes ABI 12/17/20 R=1.08 L=1.13 Electronic Signature(s) Signed: 07/24/2021 6:35:30 PM By: [name] Pilling RN, BSN Entered By: [name] on 07/24/2021 11:45:39 -------------------------------------------------------------------------------- Encounter Discharge Information Details Patient Name: Date of Service: [name], [name] [name] 07/24/2021 11:15 A M Medical Record Number: [id number] Patient Account Number: [id number] Date of Birth/Sex: Treating RN: Feb 17, 1980 (41 y.o. [name] Primary Care [name]: [name] Other Clinician: Referring [name]: Treating [name]/Extender: [name] in  Treatment: 26 Encounter Discharge Information Items Post Procedure Vitals Discharge Condition: Stable Temperature (F): 98.4 Ambulatory Status: [name] Pulse (bpm): 84 Discharge Destination: Home Respiratory Rate (breaths/min): 20 Transportation: [name] Blood Pressure (mmHg): 116/77 Accompanied By: [name] Schedule Follow-up Appointment: Yes Clinical Summary of Care: Electronic Signature(s) Signed: 07/24/2021 6:35:30 PM By: [name] Pilling RN, BSN Entered By: [name] on 07/24/2021 12:06:27 -------------------------------------------------------------------------------- Lower Extremity Assessment Details Patient Name: Date of Service: [name], [name] [name] 07/24/2021 11:15 A M Medical Record Number: [id number] Patient Account Number: [id number] Date of Birth/Sex: Treating RN: 1980-11-26 (41 y.o. [name] Primary Care [name]: [name] Other Clinician: Referring [name]: Treating [name]/Extender: [name] Weeks in Treatment: 26 Edema Assessment Assessed: [Left: No] [Right: Yes] Edema: [Left: Ye] [Right: s] Calf Left: Right: Point of Measurement: 34 cm From Medial Instep 48 cm Ankle Left: Right: Point of Measurement: 8 cm From Medial Instep 29.5 cm Vascular Assessment Pulses: Dorsalis Pedis Palpable: [Right:Yes] Notes right medial side of foot edematous, red, and warm to touch. Electronic Signature(s) Signed: 07/24/2021 6:35:30 PM By: [name] Pilling RN, BSN Entered By: [name] on 07/24/2021 11:46:47 -------------------------------------------------------------------------------- Multi Wound Chart Details Patient Name: Date of Service: [name], [name] [name] 07/24/2021 11:15 A M Medical Record Number: [id number] Patient Account Number: [id number] Date of Birth/Sex: Treating RN: [birth year] (41 y.o. [name] Primary Care Jibril Mcminn: [name] Other Clinician: Referring Jakhia Buxton: Treating  [name]/Extender: [name] in Treatment: 26 Vital Signs Height(in): 69 Pulse(bpm): 43 Weight(lbs): Blood Pressure(mmHg): 116/77 Body Mass Index(BMI): Temperature(F): 98.4 Respiratory Rate(breaths/min): 20 Photos: [1:Right Amputation Site -] [2:Right, Medial Amputation Site -] [N/A:N/A N/A] Wound Location: [1:Transmetatarsal Surgical Injury] [2:Transmetatarsal Surgical Injury] [N/A:N/A] Wounding Event: [1:Diabetic Wound/Ulcer of the Lower] [2:Dehisced Wound] [N/A:N/A] Primary Etiology: [1:Extremity Hypertension, Type II Diabetes,] [2:Hypertension, Type II Diabetes,] [N/A:N/A] Comorbid History: [1:Osteomyelitis, Neuropathy 12/15/2020] [2:Osteomyelitis, Neuropathy 07/10/2021] [N/A:N/A] Date Acquired: [1:26] [2:2] [N/A:N/A] Weeks of Treatment: [1:Open] [2:Open] [N/A:N/A]  Wound Status: [1:No] [2:No] [N/A:N/A] Wound Recurrence: [1:0.6x1.3x0.2] [2:0.3x0.7x0.3] [N/A:N/A] Measurements L x W x D (cm) [1:0.613] [2:0.165] [N/A:N/A] A (cm) : rea [1:0.123] [2:0.049] [N/A:N/A] Volume (cm) : [1:98.60%] [2:-39.80%] [N/A:N/A] % Reduction in A rea: [1:99.80%] [2:-40.00%] [N/A:N/A] % Reduction in Volume: [1:11] Starting Position 1 (o'clock): [1:1] Ending Position 1 (o'clock): [1:0.5] Maximum Distance 1 (cm): [1:Yes] [2:No] [N/A:N/A] Undermining: [1:Grade 3] [2:Full Thickness Without Exposed] [N/A:N/A] Classification: [1:Medium] [2:Support Structures Medium] [N/A:N/A] Exudate A mount: [1:Serosanguineous] [2:Serosanguineous] [N/A:N/A] Exudate Type: [1:red, brown] [2:red, brown] [N/A:N/A] Exudate Color: [1:Thickened] [2:Distinct, outline attached] [N/A:N/A] Wound Margin: [1:Small (1-33%)] [2:Large (67-100%)] [N/A:N/A] Granulation A mount: [1:Pink, Pale] [2:Red, Pink] [N/A:N/A] Granulation Quality: [1:Large (67-100%)] [2:Small (1-33%)] [N/A:N/A] Necrotic A mount: [1:Fat Layer (Subcutaneous Tissue): Yes Fat Layer (Subcutaneous Tissue): Yes N/A] Exposed  Structures: [1:Fascia: No Tendon: No Muscle: No Joint: No Bone: No Medium (34-66%)] [2:Fascia: No Tendon: No Muscle: No Joint: No Bone: No None] [N/A:N/A] Epithelialization: [1:Debridement - Excisional] [2:Debridement - Excisional] [N/A:N/A] Debridement: Pre-procedure Verification/Time Out 11:58 [2:11:58] [N/A:N/A] Taken: [1:Lidocaine 4% Topical Solution] [2:Lidocaine 4% Topical Solution] [N/A:N/A] Pain Control: [1:Callus, Subcutaneous, Slough] [2:Callus, Subcutaneous, Slough] [N/A:N/A] Tissue Debrided: [1:Skin/Subcutaneous Tissue] [2:Skin/Subcutaneous Tissue] [N/A:N/A] Level: [1:2] [2:0.5] [N/A:N/A] Debridement A (sq cm): [1:rea Curette] [2:Curette] [N/A:N/A] Instrument: [1:Minimum] [2:Minimum] [N/A:N/A] Bleeding: [1:Pressure] [2:Pressure] [N/A:N/A] Hemostasis A chieved: [1:0] [2:0] [N/A:N/A] Procedural Pain: [1:0] [2:0] [N/A:N/A] Post Procedural Pain: [1:Procedure was tolerated well] [2:Procedure was tolerated well] [N/A:N/A] Debridement Treatment Response: [1:0.6x1.3x0.2] [2:0.3x0.7x0.3] [N/A:N/A] Post Debridement Measurements L x W x D (cm) [1:0.123] [2:0.049] [N/A:N/A] Post Debridement Volume: (cm) [1:Debridement] [2:Debridement] [N/A:N/A] Treatment Notes Wound #1 (Amputation Site - Transmetatarsal) Wound Laterality: Right Cleanser Normal Saline Discharge Instruction: Cleanse the wound with Normal Saline prior to applying a clean dressing using gauze sponges, not tissue or cotton balls. Peri-Wound Care Skin Prep Discharge Instruction: Use skin prep as directed Topical Primary Dressing MediHoney Gel, tube 1.5 (oz) Discharge Instruction: Apply to wound bed as instructed Secondary Dressing ABD Pad, 5x9 Discharge Instruction: Apply over primary dressing as directed. Woven Gauze Sponge, Non-Sterile 4x4 in Discharge Instruction: Apply over primary dressing as directed. Secured With Elastic Bandage 4 inch (ACE bandage) Discharge Instruction: Secure with ACE bandage as  directed. Kerlix Roll Sterile, 4.5x3.1 (in/yd) Discharge Instruction: Secure with Kerlix as directed. Compression Wrap Compression Stockings Add-Ons Wound #2 (Amputation Site - Transmetatarsal) Wound Laterality: Right, Medial Cleanser Normal Saline Discharge Instruction: Cleanse the wound with Normal Saline prior to applying a clean dressing using gauze sponges, not tissue or cotton balls. Peri-Wound Care Skin Prep Discharge Instruction: Use skin prep as directed Topical Primary Dressing MediHoney Gel, tube 1.5 (oz) Discharge Instruction: Apply to wound bed as instructed Secondary Dressing ABD Pad, 5x9 Discharge Instruction: Apply over primary dressing as directed. Woven Gauze Sponge, Non-Sterile 4x4 in Discharge Instruction: Apply over primary dressing as directed. Secured With Elastic Bandage 4 inch (ACE bandage) Discharge Instruction: Secure with ACE bandage as directed. Kerlix Roll Sterile, 4.5x3.1 (in/yd) Discharge Instruction: Secure with Kerlix as directed. Compression Wrap Compression Stockings Add-Ons Electronic Signature(s) Signed: 07/24/2021 2:39:21 PM By: Kalman Shan DO Signed: 07/24/2021 6:35:30 PM By: [name] Pilling RN, BSN Entered By: Kalman Shan on 07/24/2021 14:30:55 -------------------------------------------------------------------------------- Multi-Disciplinary Care Plan Details Patient Name: Date of Service: [name], [name] [name] 07/24/2021 11:15 A M Medical Record Number: [id number] Patient Account Number: [id number] Date of Birth/Sex: Treating RN: 12/27/1980 (41 y.o. [name] Primary Care Jocelyn Lowery: [name] Other Clinician: Referring Coe Angelos: Treating Belinda Schlichting/Extender: [name] in Treatment: 26 Active  Inactive Nutrition Nursing Diagnoses: Impaired glucose control: actual or potential Goals: Patient/caregiver verbalizes understanding of need to maintain therapeutic glucose control per  primary care physician Date Initiated: 01/22/2021 Target Resolution Date: 08/07/2021 Goal Status: Active Interventions: Assess HgA1c results as ordered upon admission and as needed Provide education on elevated blood sugars and impact on wound healing Treatment Activities: Obtain HgA1c : 01/22/2021 Notes: 07/10/21: Glucose control ongoing. Wound/Skin Impairment Nursing Diagnoses: Impaired tissue integrity Goals: Patient/caregiver will verbalize understanding of skin care regimen Date Initiated: 01/22/2021 Target Resolution Date: 08/07/2021 Goal Status: Active Ulcer/skin breakdown will have a volume reduction of 30% by week 4 Date Initiated: 01/22/2021 Date Inactivated: 03/19/2021 Target Resolution Date: 03/16/2021 Goal Status: Met Ulcer/skin breakdown will have a volume reduction of 50% by week 8 Date Initiated: 03/19/2021 Date Inactivated: 05/14/2021 Target Resolution Date: 04/16/2021 Unmet Reason: see wound Goal Status: Unmet measurements Interventions: Assess patient/caregiver ability to obtain necessary supplies Assess patient/caregiver ability to perform ulcer/skin care regimen upon admission and as needed Assess ulceration(s) every visit Provide education on ulcer and skin care Treatment Activities: Topical wound management initiated : 01/22/2021 Notes: 06/04/21: Wound vac started 07/10/21: Wound care regimen continues Electronic Signature(s) Signed: 07/24/2021 6:35:30 PM By: [name] Pilling RN, BSN Entered By: [name] on 07/24/2021 11:59:58 -------------------------------------------------------------------------------- Pain Assessment Details Patient Name: Date of Service: [name] [name] [name], [name] [name] 07/24/2021 11:15 A M Medical Record Number: [id number] Patient Account Number: [id number] Date of Birth/Sex: Treating RN: 1980/09/07 (41 y.o. [name] Primary Care Vada Swift: [name] Other Clinician: Referring Kassidy Frankson: Treating Asha Grumbine/Extender: [name] in Treatment: 26 Active Problems Location of Pain Severity and Description of Pain Patient Has Paino Yes Site Locations Pain Location: Pain Location: Pain in Ulcers Rate the pain. Current Pain Level: 4 Character of Pain Describe the Pain: Heavy, Sharp Pain Management and Medication Current Pain Management: Medication: No Cold Application: No Rest: No Massage: No Activity: No T.E.N.S.: No Heat Application: No Leg drop or elevation: No [name] the Current Pain Management Adequate: Adequate How does your wound impact your activities of daily livingo Sleep: No Bathing: No Appetite: No Relationship With Others: No Bladder Continence: No Emotions: No Bowel Continence: No Work: No Toileting: No Drive: No Dressing: No Hobbies: No Engineer, maintenance) Signed: 07/24/2021 6:35:30 PM By: [name] Pilling RN, BSN Entered By: [name] on 07/24/2021 11:46:18 -------------------------------------------------------------------------------- Patient/Caregiver Education Details Patient Name: Date of Service: [name], [name] [name] 7/11/2023andnbsp11:15 A M Medical Record Number: [id number] Patient Account Number: [id number] Date of Birth/Gender: Treating RN: 04-20-1980 (41 y.o. [name] Primary Care Physician: [name] Other Clinician: Referring Physician: Treating Physician/Extender: [name] in Treatment: 26 Education Assessment Education Provided To: Patient Education Topics Provided Wound/Skin Impairment: Handouts: Skin Care Do's and Dont's Methods: Explain/Verbal Responses: Reinforcements needed Electronic Signature(s) Signed: 07/24/2021 6:35:30 PM By: [name] Pilling RN, BSN Signed: 07/24/2021 6:35:30 PM By: [name] Pilling RN, BSN Entered By: [name] on 07/24/2021 12:00:22 -------------------------------------------------------------------------------- Wound Assessment Details Patient Name: Date of  Service: [name], [name] [name] 07/24/2021 11:15 A M Medical Record Number: [id number] Patient Account Number: [id number] Date of Birth/Sex: Treating RN: [birth year] (41 y.o. 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 Wound Status: Open Wounding Event: Surgical Injury Comorbid Hypertension, Type II Diabetes,  Osteomyelitis, History: Neuropathy Date Acquired: 12/15/2020 Weeks Of Treatment: 26 Clustered Wound: No Photos Wound Measurements Length: (cm) 0.6 Width: (cm) 1.3 Depth: (cm) 0.2 Area: (cm) 0.613 Volume: (cm) 0.123 % Reduction in Area: 98.6% % Reduction in Volume: 99.8% Epithelialization: Medium (34-66%) Tunneling: No Undermining: Yes Starting Position (o'clock): 11 Ending Position (o'clock): 1 Maximum Distance: (cm) 0.5 Wound Description Classification: Grade 3 Wound Margin: Thickened Exudate Amount: Medium Exudate Type: Serosanguineous Exudate Color: red, brown Foul Odor After Cleansing: No Slough/Fibrino Yes Wound Bed Granulation Amount: Small (1-33%) Exposed Structure Granulation Quality: Pink, Pale Fascia Exposed: No Necrotic Amount: Large (67-100%) Fat Layer (Subcutaneous Tissue) Exposed: Yes Necrotic Quality: Adherent Slough Tendon Exposed: No Muscle Exposed: No Joint Exposed: No Bone Exposed: No Treatment Notes Wound #1 (Amputation Site - Transmetatarsal) Wound Laterality: Right Cleanser Normal Saline Discharge Instruction: Cleanse the wound with Normal Saline prior to applying a clean dressing using gauze sponges, not tissue or cotton balls. Peri-Wound Care Skin Prep Discharge Instruction: Use skin prep as directed Topical Primary Dressing MediHoney Gel, tube 1.5  (oz) Discharge Instruction: Apply to wound bed as instructed Secondary Dressing ABD Pad, 5x9 Discharge Instruction: Apply over primary dressing as directed. Woven Gauze Sponge, Non-Sterile 4x4 in Discharge Instruction: Apply over primary dressing as directed. Secured With Elastic Bandage 4 inch (ACE bandage) Discharge Instruction: Secure with ACE bandage as directed. Kerlix Roll Sterile, 4.5x3.1 (in/yd) Discharge Instruction: Secure with Kerlix as directed. Compression Wrap Compression Stockings Add-Ons Electronic Signature(s) Signed: 07/24/2021 5:27:29 PM By: Lorrin Jackson Signed: 07/24/2021 6:35:30 PM By: [name] Pilling RN, BSN Entered By: Lorrin Jackson on 07/24/2021 11:48:26 -------------------------------------------------------------------------------- Wound Assessment Details Patient Name: Date of Service: [name], [name] [name] 07/24/2021 11:15 A M Medical Record Number: [id number] Patient Account Number: [id number] Date of Birth/Sex: Treating RN: Jan 04, 1981 (41 y.o. [name] Primary Care Ameli Sangiovanni: [name] Other Clinician: Referring Deone Omahoney: Treating Anissia Wessells/Extender: [name] Weeks in Treatment: 26 Wound Status Wound Number: 2 Primary Etiology: Dehisced Wound Wound Location: Right, Medial Amputation Site - Transmetatarsal Wound Status: Open Wounding Event: Surgical Injury Comorbid Hypertension, Type II Diabetes, Osteomyelitis, History: Neuropathy Date Acquired: 07/10/2021 Weeks Of Treatment: 2 Clustered Wound: No Photos Wound Measurements Length: (cm) 0.3 Width: (cm) 0.7 Depth: (cm) 0.3 Area: (cm) 0.165 Volume: (cm) 0.049 % Reduction in Area: -39.8% % Reduction in Volume: -40% Epithelialization: None Tunneling: No Undermining: No Wound Description Classification: Full Thickness Without Exposed Support Structures Wound Margin: Distinct, outline attached Exudate Amount: Medium Exudate Type:  Serosanguineous Exudate Color: red, brown Foul Odor After Cleansing: No Slough/Fibrino Yes Wound Bed Granulation Amount: Large (67-100%) Exposed Structure Granulation Quality: Red, Pink Fascia Exposed: No Necrotic Amount: Small (1-33%) Fat Layer (Subcutaneous Tissue) Exposed: Yes Necrotic Quality: Adherent Slough Tendon Exposed: No Muscle Exposed: No Joint Exposed: No Bone Exposed: No Treatment Notes Wound #2 (Amputation Site - Transmetatarsal) Wound Laterality: Right, Medial Cleanser Normal Saline Discharge Instruction: Cleanse the wound with Normal Saline prior to applying a clean dressing using gauze sponges, not tissue or cotton balls. Peri-Wound Care Skin Prep Discharge Instruction: Use skin prep as directed Topical Primary Dressing MediHoney Gel, tube 1.5 (oz) Discharge Instruction: Apply to wound bed as instructed Secondary Dressing ABD Pad, 5x9 Discharge Instruction: Apply over primary dressing as directed. Woven Gauze Sponge, Non-Sterile 4x4 in Discharge Instruction: Apply over primary dressing as directed. Secured With Elastic Bandage 4 inch (ACE bandage) Discharge Instruction: Secure with ACE bandage as directed. Kerlix Roll Sterile, 4.5x3.1 (in/yd) Discharge Instruction: Secure with Kerlix as directed.  Compression Wrap Compression Stockings Add-Ons Electronic Signature(s) Signed: 07/24/2021 5:27:29 PM By: Lorrin Jackson Signed: 07/24/2021 6:35:30 PM By: [name] Pilling RN, BSN Entered By: Lorrin Jackson on 07/24/2021 11:48:52 -------------------------------------------------------------------------------- Vitals Details Patient Name: Date of Service: [name], [name] [name] 07/24/2021 11:15 A M Medical Record Number: [id number] Patient Account Number: [id number] Date of Birth/Sex: Treating RN: October 22, 1980 (41 y.o. [name] Shoe, Tammi Klippel Primary Care Jaleeyah Munce: [name] Other Clinician: Referring Talley Kreiser: Treating Damaria Stofko/Extender: [name] in Treatment: 26 Vital Signs Time Taken: 11:38 Temperature (F): 98.4 Height (in): 69 Pulse (bpm): 89 Respiratory Rate (breaths/min): 20 Blood Pressure (mmHg): 116/77 Reference Range: 80 - 120 mg / dl Electronic Signature(s) Signed: 07/24/2021 6:35:30 PM By: [name] Pilling RN, BSN Entered By: [name] on 07/24/2021 11:45:58

## 2021-07-24 NOTE — Progress Notes (Signed)
[name], [name] ([id number]) Visit Report for 07/24/2021 Chief Complaint Document Details Patient Name: Date of Service: [name] IS, [place] [name] 07/24/2021 11:15 A M Medical Record Number: [id number] Patient Account Number: [id number] Date of Birth/Sex: Treating RN: 11-17-1980 (41 y.o. [name] Primary Care Provider: Gwinda Passe Other Clinician: Referring Provider: Treating Provider/Extender: Grace Isaac in Treatment: 26 Information Obtained from: Patient Chief Complaint Osteomyelitis of the right foot status post transmetatarsal amputation with surgical site dehiscence Electronic Signature(s) Signed: 07/24/2021 2:39:21 PM By: Geralyn Corwin DO Entered By: Geralyn Corwin on 07/24/2021 14:31:10 -------------------------------------------------------------------------------- Debridement Details Patient Name: Date of Service: [name], [name] 07/24/2021 11:15 A M Medical Record Number: [id number] Patient Account Number: [id number] Date of Birth/Sex: Treating RN: 10-21-1980 (41 y.o. [name], [name] Primary Care Provider: Gwinda Passe Other Clinician: Referring Provider: Treating Provider/Extender: Grace Isaac in Treatment: 26 Debridement Performed for Assessment: Wound #1 Right Amputation Site - Transmetatarsal Performed By: Physician Geralyn Corwin, DO Debridement Type: Debridement Severity of Tissue Pre Debridement: Fat layer exposed Level of Consciousness (Pre-procedure): Awake and Alert Pre-procedure Verification/Time Out Yes - 11:58 Taken: Start Time: 11:59 Pain Control: Lidocaine 4% T opical Solution T Area Debrided (L x W): otal 1 (cm) x 2 (cm) = 2 (cm) Tissue and other material debrided: Viable, Non-Viable, Callus, Slough, Subcutaneous, Slough Level: Skin/Subcutaneous Tissue Debridement Description: Excisional Instrument: Curette Bleeding: Minimum Hemostasis Achieved: Pressure End Time:  12:04 Procedural Pain: 0 Post Procedural Pain: 0 Response to Treatment: Procedure was tolerated well Level of Consciousness (Post- Awake and Alert procedure): Post Debridement Measurements of Total Wound Length: (cm) 0.6 Width: (cm) 1.3 Depth: (cm) 0.2 Volume: (cm) 0.123 Character of Wound/Ulcer Post Debridement: Requires Further Debridement Severity of Tissue Post Debridement: Fat layer exposed Post Procedure Diagnosis Same as Pre-procedure Electronic Signature(s) Signed: 07/24/2021 2:39:21 PM By: Geralyn Corwin DO Signed: 07/24/2021 6:35:30 PM By: Shawn Stall RN, BSN Entered By: Shawn Stall on 07/24/2021 12:05:17 -------------------------------------------------------------------------------- Debridement Details Patient Name: Date of Service: [name], [name] 07/24/2021 11:15 A M Medical Record Number: [id number] Patient Account Number: [id number] Date of Birth/Sex: Treating RN: 06-21-1980 (41 y.o. [name], [name] Primary Care Provider: Gwinda Passe Other Clinician: Referring Provider: Treating Provider/Extender: Grace Isaac in Treatment: 26 Debridement Performed for Assessment: Wound #2 Right,Medial Amputation Site - Transmetatarsal Performed By: Physician Geralyn Corwin, DO Debridement Type: Debridement Level of Consciousness (Pre-procedure): Awake and Alert Pre-procedure Verification/Time Out Yes - 11:58 Taken: Start Time: 11:59 Pain Control: Lidocaine 4% T opical Solution T Area Debrided (L x W): otal 0.5 (cm) x 1 (cm) = 0.5 (cm) Tissue and other material debrided: Viable, Non-Viable, Callus, Slough, Subcutaneous, Slough Level: Skin/Subcutaneous Tissue Debridement Description: Excisional Instrument: Curette Bleeding: Minimum Hemostasis Achieved: Pressure End Time: 12:04 Procedural Pain: 0 Post Procedural Pain: 0 Response to Treatment: Procedure was tolerated well Level of Consciousness (Post- Awake and  Alert procedure): Post Debridement Measurements of Total Wound Length: (cm) 0.3 Width: (cm) 0.7 Depth: (cm) 0.3 Volume: (cm) 0.049 Character of Wound/Ulcer Post Debridement: Requires Further Debridement Post Procedure Diagnosis Same as Pre-procedure Electronic Signature(s) Signed: 07/24/2021 2:39:21 PM By: Geralyn Corwin DO Signed: 07/24/2021 6:35:30 PM By: Shawn Stall RN, BSN Entered By: Shawn Stall on 07/24/2021 12:05:41 -------------------------------------------------------------------------------- HPI Details Patient Name: Date of Service: [name], [name] 07/24/2021 11:15 A M Medical Record Number: [id number] Patient Account Number: [id number] Date of Birth/Sex: Treating RN: 07-07-1980 (41 y.o. F) [name], [name]  Primary Care Provider: Gwinda Passe Other Clinician: Referring Provider: Treating Provider/Extender: Grace Isaac in Treatment: 26 History of Present Illness HPI Description: Admission 01/22/2021 [name] is a 41 year old female with a past medical history of insulin-dependent uncontrolled type 2 diabetes with last hemoglobin A1c of 13.5, osteomyelitis of the right foot status post transmetatarsal amputation on 12/18/2020 that presents to the clinic for right foot wound. She has had dehiscence of the surgical site. She is currently using wet-to-dry dressings. She has a PICC line and receiving IV ceftriaxone daily for her osteomyelitis. There is an end date of 01/27/2021. She is also taking oral metronidazole. She currently denies systemic signs of infection. 1/19; patient presents for follow-up. She was diagnosed with a DVT to the right lower extremity 2 days ago. She is on Eliquis now. She is scheduled to see her infectious disease doctor tomorrow. She has been using Dakin's wet-to-dry dressings. She denies systemic signs of infection. 1/26; patient presents for follow-up. She saw infectious disease on 1/21 started on  Augmentin. Her PICC line and IV ceftriaxone was discontinued. Patient reports stability to her wound. She has been using Dakin's wet-to-dry dressings. She currently denies systemic signs of infection. 2/3; patient presents for follow-up. She continues to use Dakin's wet-to-dry dressings to the wound bed. She saw Dr. Manson Passey with infectious disease yesterday and is continuing Augmentin. T entative end date is 2/16. Patient reports following up with orthopedics. She states there is no further plan from them. She currently denies systemic signs of infection. 2/10; patient presents for follow-up. She continues to use Dakin's wet to dry dressings. She is scheduled to have her MRI done on 2/14. She states that she had pain to the debridement site from last clinic visit and declines debridement today. She denies systemic signs of infection. She continues to have yellow thick drainage. 2/20; patient presents for follow-up. She continues to use Dakin's wet-to-dry dressings. She obtained her MRI. The results showed an abscess and she is scheduled to see her orthopedic surgeon on 2/23. She saw infectious disease 2/17 and her antibiotics were extended. She currently denies systemic signs of infection. 3/6; patient presents for follow-up. She had debridement and irrigation of her foot on 03/10/2021 due to abscess noted on MRI. She was started on IV ceftriaxone and oral Flagyl. She has no issues or complaints today. She has been using iodoform packing to the tunnel and Dakin's wet-to-dry to the opening. 03/26/2021: She continues on IV ceftriaxone and oral metronidazole. She has follow-up with infectious disease tomorrow. No significant issues or complaints today. Her mother continues to help her with her wound dressing, using iodoform packing strips into the tunnel and Dakin's to the open portion of the wound. 3/20; patient presents for follow-up. She continues to be on IV ceftriaxone in oral metronidazole. She has  been using iodoform to the tunnel and Dakin's wet-to- dry to the open wound. She denies signs of infection. 3/27; patient presents for follow-up. She no longer has a PICC line. She has been using iodoform to the tunnel and Dakin's wet-to-dry to the open wound. She reports improvement in wound healing. She denies signs of infection. 4/3; patient presents for follow-up. She states she has been using Hydrofera Blue to the open wound and iodoform packing to the tunnel without any issues. She denies signs of infection. 4/18; patient presents for follow-up. She saw infectious disease on 4/11. She has finished her oral antibiotics and completed a total of 6 weeks of antibiotics (  this includes IV as well). No further antibiotics needed. She has been using Hydrofera Blue and iodoform packing. She states that the tunneled area has come in and the iodoform is not staying in place anymore. She has no issues or complaints today. She denies signs of infection. 4/24; patient presents for follow-up. She saw Dr. Carlene CoriaLubin's, plastic surgery to discuss potential skin graft/substitute placement. At this time he thinks that the skin graft would likely not take. He is in agreement with trying a wound VAC. Patient has been using Hydrofera Blue dressing changes with no issues. She denies signs of infection. She reports improvement in wound healing. 5/1; patient presents for follow-up. Unfortunately patient did not have insurance when we ran for the pico. There is an assistance program and we are trying to get this accommodated for the patient. In the meantime she has been using Hydrofera Blue without any issues. She denies signs of infection. 5/8; patient presents for follow-up. We have not heard back if pico is covered by her insurance. She has been using collagen to the wound bed over the past week. She denies signs of infection. 5/18; patient presents for follow-up. She has been using collagen to the wound bed without  issues. Again we have not heard if pico is covered by her insurance. She has no issues or complaints today. 5/23; patient presents for follow-up. She has been using collagen to the wound bed. She has no issues or complaints today. [facility] and brought this in today. She denies signs of infection. 6/1; patient presents for follow-up. She has been using the wound VAC for the past week. She has had this changed twice since she was last here. She reports more maceration to the periwound. She denies signs of infection. 6/7; right TMA site. There are 2 wounds 0 separated by a bridge of healed tissue. The more lateral area has undermining. Both areas have healthy looking granulation at the base but relative the size of the wound is fairly deep. There is no exposed bone no evidence of infection. Her wound VAC was put on hold last week because of surrounding skin maceration she has been using collagen this week. She has a modified shoe 6/12; patient presents for follow-up. Last week the wound VAC was reinitiated. She had no issues with the wound VAC itself. Today she has maceration again noted to the surrounding skin. She denies signs of infection. 6/27; patient presents for follow-up. She has been using Medihoney to the wound bed. We took a break from the wound VAC because the periwound was macerated. She still has some areas of maceration to the distal foot where there is a callus. She currently denies signs of infection. 7/11; patient presents for follow-up. She has been using Medihoney to the wound bed. She has developed some increased warmth and erythema to the lateral aspect of the right foot. She states this is occurred over the past week and there is increased pain. No drainage noted. Electronic Signature(s) Signed: 07/24/2021 2:39:21 PM By: Geralyn CorwinHoffman, Arleigh Dicola DO Entered By: Geralyn CorwinHoffman, Aamina Skiff on 07/24/2021  14:32:26 -------------------------------------------------------------------------------- Physical Exam Details Patient Name: Date of Service: [name], [name] 07/24/2021 11:15 A M Medical Record Number: [id number] Patient Account Number: [id number] Date of Birth/Sex: Treating RN: [birth year] (41 y.o. [name]) [name], [name] Primary Care Provider: Gwinda PasseEdwards, Michelle Other Clinician: Referring Provider: Treating Provider/Extender: Grace IsaacHoffman, Sharra Cayabyab Edwards, Michelle Weeks in Treatment: 26 Constitutional respirations regular, non-labored and within target range for patient..Marland Kitchen  Cardiovascular 2+ dorsalis pedis/posterior tibialis pulses. Psychiatric pleasant and cooperative. Notes Right foot: T the transmetatarsal amputation site there is wound dehiscence. There are 2 open areas separated by bridge of healed tissue. The wound area is o narrowed with granulation tissue and nonviable tissue present. No purulent drainage. T the lateral aspect there is increased warmth and erythema noted. o Electronic Signature(s) Signed: 07/24/2021 2:39:21 PM By: Geralyn Corwin DO Entered By: Geralyn Corwin on 07/24/2021 14:33:20 -------------------------------------------------------------------------------- Physician Orders Details Patient Name: Date of Service: [name], [name] 07/24/2021 11:15 A M Medical Record Number: [id number] Patient Account Number: [id number] Date of Birth/Sex: Treating RN: [birth year] (41 y.o. [name] Primary Care Provider: Gwinda Passe Other Clinician: Referring Provider: Treating Provider/Extender: Grace Isaac in Treatment: 26 Verbal / Phone Orders: No Diagnosis Coding ICD-10 Coding Code Description L97.514 Non-pressure chronic ulcer of other part of right foot with necrosis of bone E11.621 Type 2 diabetes mellitus with foot ulcer M86.9 Osteomyelitis, unspecified Follow-up Appointments ppointment in 1 week. - Dr. Mikey Bussing and Lennox Laity,  RN Room 7 07/30/2021 245pm Return A Other: - Pick up oral antibiotics today and start. From your pharmacy. If fever, chills, worsening pain, or redness at foot go to the emergency department. Call [employer] [facility] to pick up wound vac. Bathing/ Shower/ Hygiene Other Bathing/Shower/Hygiene Orders/Instructions: - Clean with Saline or Dakins Negative Presssure Wound Therapy Discontinue wound vac - Call [employer] company and return wound vac. Edema Control - Lymphedema / SCD / Other Elevate legs to the level of the heart or above for 30 minutes daily and/or when sitting, a frequency of: - throughout the day Avoid standing for long periods of time. Moisturize legs daily. Off-Loading Open toe surgical shoe to: - May use surgical shoe Additional Orders / Instructions Follow Nutritious Diet - -Monitor/Control Blood Sugar -High Protein Diet Wound Treatment Wound #1 - Amputation Site - Transmetatarsal Wound Laterality: Right Cleanser: Normal Saline (Generic) 1 x Per Day/30 Days Discharge Instructions: Cleanse the wound with Normal Saline prior to applying a clean dressing using gauze sponges, not tissue or cotton balls. Peri-Wound Care: Skin Prep 1 x Per Day/30 Days Discharge Instructions: Use skin prep as directed Prim Dressing: MediHoney Gel, tube 1.5 (oz) 1 x Per Day/30 Days ary Discharge Instructions: Apply to wound bed as instructed Secondary Dressing: ABD Pad, 5x9 1 x Per Day/30 Days Discharge Instructions: Apply over primary dressing as directed. Secondary Dressing: Woven Gauze Sponge, Non-Sterile 4x4 in 1 x Per Day/30 Days Discharge Instructions: Apply over primary dressing as directed. Secured With: Elastic Bandage 4 inch (ACE bandage) (Generic) 1 x Per Day/30 Days Discharge Instructions: Secure with ACE bandage as directed. Secured With: [employer], 4.5x3.1 (in/yd) (Generic) 1 x Per Day/30 Days Discharge Instructions: Secure with Kerlix as directed. Wound #2 - Amputation Site -  Transmetatarsal Wound Laterality: Right, Medial Cleanser: Normal Saline (Generic) 1 x Per Day/30 Days Discharge Instructions: Cleanse the wound with Normal Saline prior to applying a clean dressing using gauze sponges, not tissue or cotton balls. Peri-Wound Care: Skin Prep 1 x Per Day/30 Days Discharge Instructions: Use skin prep as directed Prim Dressing: MediHoney Gel, tube 1.5 (oz) 1 x Per Day/30 Days ary Discharge Instructions: Apply to wound bed as instructed Secondary Dressing: ABD Pad, 5x9 1 x Per Day/30 Days Discharge Instructions: Apply over primary dressing as directed. Secondary Dressing: Woven Gauze Sponge, Non-Sterile 4x4 in 1 x Per Day/30 Days Discharge Instructions: Apply over primary dressing as directed. Secured With:  Elastic Bandage 4 inch (ACE bandage) (Generic) 1 x Per Day/30 Days Discharge Instructions: Secure with ACE bandage as directed. Secured With: [employer], 4.5x3.1 (in/yd) (Generic) 1 x Per Day/30 Days Discharge Instructions: Secure with Kerlix as directed. Patient Medications llergies: mango A Notifications Medication Indication Start End 07/24/2021 Bactrim DS DOSE 1 - oral 800 mg-160 mg tablet - 1 tablet oral BID x 7 days Electronic Signature(s) Signed: 07/24/2021 2:39:21 PM By: Geralyn Corwin DO Previous Signature: 07/24/2021 12:11:46 PM Version By: Geralyn Corwin DO Entered By: Geralyn Corwin on 07/24/2021 14:33:34 -------------------------------------------------------------------------------- Problem List Details Patient Name: Date of Service: [name], [name] 07/24/2021 11:15 A M Medical Record Number: [id number] Patient Account Number: [id number] Date of Birth/Sex: Treating RN: 10-11-1980 (41 y.o. [name] Primary Care Provider: Gwinda Passe Other Clinician: Referring Provider: Treating Provider/Extender: Grace Isaac in Treatment: 26 Active Problems ICD-10 Encounter Code Description  Active Date MDM Diagnosis L97.514 Non-pressure chronic ulcer of other part of right foot with necrosis of bone 01/22/2021 No Yes E11.621 Type 2 diabetes mellitus with foot ulcer 01/22/2021 No Yes M86.9 Osteomyelitis, unspecified 01/22/2021 No Yes Inactive Problems Resolved Problems Electronic Signature(s) Signed: 07/24/2021 2:39:21 PM By: Geralyn Corwin DO Entered By: Geralyn Corwin on 07/24/2021 14:30:47 -------------------------------------------------------------------------------- Progress Note Details Patient Name: Date of Service: [name], [name] 07/24/2021 11:15 A M Medical Record Number: [id number] Patient Account Number: [id number] Date of Birth/Sex: Treating RN: Jun 23, 1980 (41 y.o. [name] Primary Care Provider: Gwinda Passe Other Clinician: Referring Provider: Treating Provider/Extender: Grace Isaac in Treatment: 26 Subjective Chief Complaint Information obtained from Patient Osteomyelitis of the right foot status post transmetatarsal amputation with surgical site dehiscence History of Present Illness (HPI) Admission 01/22/2021 Ms. Renalda Locklin is a 41 year old female with a past medical history of insulin-dependent uncontrolled type 2 diabetes with last hemoglobin A1c of 13.5, osteomyelitis of the right foot status post transmetatarsal amputation on 12/18/2020 that presents to the clinic for right foot wound. She has had dehiscence of the surgical site. She is currently using wet-to-dry dressings. She has a PICC line and receiving IV ceftriaxone daily for her osteomyelitis. There is an end date of 01/27/2021. She is also taking oral metronidazole. She currently denies systemic signs of infection. 1/19; patient presents for follow-up. She was diagnosed with a DVT to the right lower extremity 2 days ago. She is on Eliquis now. She is scheduled to see her infectious disease doctor tomorrow. She has been using Dakin's wet-to-dry dressings.  She denies systemic signs of infection. 1/26; patient presents for follow-up. She saw infectious disease on 1/21 started on Augmentin. Her PICC line and IV ceftriaxone was discontinued. Patient reports stability to her wound. She has been using Dakin's wet-to-dry dressings. She currently denies systemic signs of infection. 2/3; patient presents for follow-up. She continues to use Dakin's wet-to-dry dressings to the wound bed. She saw Dr. Manson Passey with infectious disease yesterday and is continuing Augmentin. T entative end date is 2/16. Patient reports following up with orthopedics. She states there is no further plan from them. She currently denies systemic signs of infection. 2/10; patient presents for follow-up. She continues to use Dakin's wet to dry dressings. She is scheduled to have her MRI done on 2/14. She states that she had pain to the debridement site from last clinic visit and declines debridement today. She denies systemic signs of infection. She continues to have yellow thick drainage. 2/20; patient presents for follow-up.  She continues to use Dakin's wet-to-dry dressings. She obtained her MRI. The results showed an abscess and she is scheduled to see her orthopedic surgeon on 2/23. She saw infectious disease 2/17 and her antibiotics were extended. She currently denies systemic signs of infection. 3/6; patient presents for follow-up. She had debridement and irrigation of her foot on 03/10/2021 due to abscess noted on MRI. She was started on IV ceftriaxone and oral Flagyl. She has no issues or complaints today. She has been using iodoform packing to the tunnel and Dakin's wet-to-dry to the opening. 03/26/2021: She continues on IV ceftriaxone and oral metronidazole. She has follow-up with infectious disease tomorrow. No significant issues or complaints today. Her mother continues to help her with her wound dressing, using iodoform packing strips into the tunnel and Dakin's to the open  portion of the wound. 3/20; patient presents for follow-up. She continues to be on IV ceftriaxone in oral metronidazole. She has been using iodoform to the tunnel and Dakin's wet-to- dry to the open wound. She denies signs of infection. 3/27; patient presents for follow-up. She no longer has a PICC line. She has been using iodoform to the tunnel and Dakin's wet-to-dry to the open wound. She reports improvement in wound healing. She denies signs of infection. 4/3; patient presents for follow-up. She states she has been using Hydrofera Blue to the open wound and iodoform packing to the tunnel without any issues. She denies signs of infection. 4/18; patient presents for follow-up. She saw infectious disease on 4/11. She has finished her oral antibiotics and completed a total of 6 weeks of antibiotics (this includes IV as well). No further antibiotics needed. She has been using Hydrofera Blue and iodoform packing. She states that the tunneled area has come in and the iodoform is not staying in place anymore. She has no issues or complaints today. She denies signs of infection. 4/24; patient presents for follow-up. She saw Dr. Carlene Coria, plastic surgery to discuss potential skin graft/substitute placement. At this time he thinks that the skin graft would likely not take. He is in agreement with trying a wound VAC. Patient has been using Hydrofera Blue dressing changes with no issues. She denies signs of infection. She reports improvement in wound healing. 5/1; patient presents for follow-up. Unfortunately patient did not have insurance when we ran for the pico. There is an assistance program and we are trying to get this accommodated for the patient. In the meantime she has been using Hydrofera Blue without any issues. She denies signs of infection. 5/8; patient presents for follow-up. We have not heard back if pico is covered by her insurance. She has been using collagen to the wound bed over the  past week. She denies signs of infection. 5/18; patient presents for follow-up. She has been using collagen to the wound bed without issues. Again we have not heard if pico is covered by her insurance. She has no issues or complaints today. 5/23; patient presents for follow-up. She has been using collagen to the wound bed. She has no issues or complaints today. She obtained the wound VAC from [facility] and brought this in today. She denies signs of infection. 6/1; patient presents for follow-up. She has been using the wound VAC for the past week. She has had this changed twice since she was last here. She reports more maceration to the periwound. She denies signs of infection. 6/7; right TMA site. There are 2 wounds 0 separated by a bridge of healed tissue. The  more lateral area has undermining. Both areas have healthy looking granulation at the base but relative the size of the wound is fairly deep. There is no exposed bone no evidence of infection. Her wound VAC was put on hold last week because of surrounding skin maceration she has been using collagen this week. She has a modified shoe 6/12; patient presents for follow-up. Last week the wound VAC was reinitiated. She had no issues with the wound VAC itself. Today she has maceration again noted to the surrounding skin. She denies signs of infection. 6/27; patient presents for follow-up. She has been using Medihoney to the wound bed. We took a break from the wound VAC because the periwound was macerated. She still has some areas of maceration to the distal foot where there is a callus. She currently denies signs of infection. 7/11; patient presents for follow-up. She has been using Medihoney to the wound bed. She has developed some increased warmth and erythema to the lateral aspect of the right foot. She states this is occurred over the past week and there is increased pain. No drainage noted. Patient History Information obtained from Patient. Family  History Cancer - Paternal Grandparents, Diabetes - Mother, Hypertension - Mother, Stroke - Maternal Grandparents, No family history of Heart Disease, Hereditary Spherocytosis, Kidney Disease, Lung Disease, Seizures, Thyroid Problems, Tuberculosis. Social History Never smoker, Marital Status - Single, Alcohol Use - Rarely, Drug Use - Prior History - Marijuana, Caffeine Use - Daily. Medical History Cardiovascular Patient has history of Hypertension Endocrine Patient has history of Type II Diabetes Musculoskeletal Patient has history of Osteomyelitis - Right Transmet 12/18/20 Neurologic Patient has history of Neuropathy Objective Constitutional respirations regular, non-labored and within target range for patient.. Vitals Time Taken: 11:38 AM, Height: 69 in, Temperature: 98.4 F, Pulse: 89 bpm, Respiratory Rate: 20 breaths/min, Blood Pressure: 116/77 mmHg. Cardiovascular 2+ dorsalis pedis/posterior tibialis pulses. Psychiatric pleasant and cooperative. General Notes: Right foot: T the transmetatarsal amputation site there is wound dehiscence. There are 2 open areas separated by bridge of healed tissue. The o wound area is narrowed with granulation tissue and nonviable tissue present. No purulent drainage. T the lateral aspect there is increased warmth and erythema o noted. Integumentary (Hair, Skin) Wound #1 status is Open. Original cause of wound was Surgical Injury. The date acquired was: 12/15/2020. The wound has been in treatment 26 weeks. The wound is located on the Right Amputation Site - Transmetatarsal. The wound measures 0.6cm length x 1.3cm width x 0.2cm depth; 0.613cm^2 area and 0.123cm^3 volume. There is Fat Layer (Subcutaneous Tissue) exposed. There is no tunneling noted, however, there is undermining starting at 11:00 and ending at 1:00 with a maximum distance of 0.5cm. There is a medium amount of serosanguineous drainage noted. The wound margin is thickened. There is small  (1- 33%) pink, pale granulation within the wound bed. There is a large (67-100%) amount of necrotic tissue within the wound bed including Adherent Slough. Wound #2 status is Open. Original cause of wound was Surgical Injury. The date acquired was: 07/10/2021. The wound has been in treatment 2 weeks. The wound is located on the Right,Medial Amputation Site - Transmetatarsal. The wound measures 0.3cm length x 0.7cm width x 0.3cm depth; 0.165cm^2 area and 0.049cm^3 volume. There is Fat Layer (Subcutaneous Tissue) exposed. There is no tunneling or undermining noted. There is a medium amount of serosanguineous drainage noted. The wound margin is distinct with the outline attached to the wound base. There is large (67-100%) red,  pink granulation within the wound bed. There is a small (1-33%) amount of necrotic tissue within the wound bed including Adherent Slough. Assessment Active Problems ICD-10 Non-pressure chronic ulcer of other part of right foot with necrosis of bone Type 2 diabetes mellitus with foot ulcer Osteomyelitis, unspecified Patient's wound has shown improvement in size and appearance since last clinic visit. I debrided nonviable tissue and recommended continuing Medihoney. She has developed increased warmth and erythema to the lateral aspect. I am worried for a soft tissue infection. I will prescribe her Bactrim. She did have a wound culture done at her previous clinic visit that showed high levels of Aerococcus urinae and coag negative staph. This is likely a contaminant However Bactrim will cover this. I do not think she would benefit from [facility] antibiotics. Follow-up in 1 week. Procedures Wound #1 Pre-procedure diagnosis of Wound #1 is a Diabetic Wound/Ulcer of the Lower Extremity located on the Right Amputation Site - Transmetatarsal .Severity of Tissue Pre Debridement is: Fat layer exposed. There was a Excisional Skin/Subcutaneous Tissue Debridement with a total area of 2 sq cm  performed by Geralyn Corwin, DO. With the following instrument(s): Curette to remove Viable and Non-Viable tissue/material. Material removed includes Callus, Subcutaneous Tissue, and Slough after achieving pain control using Lidocaine 4% Topical Solution. A time out was conducted at 11:58, prior to the start of the procedure. A Minimum amount of bleeding was controlled with Pressure. The procedure was tolerated well with a pain level of 0 throughout and a pain level of 0 following the procedure. Post Debridement Measurements: 0.6cm length x 1.3cm width x 0.2cm depth; 0.123cm^3 volume. Character of Wound/Ulcer Post Debridement requires further debridement. Severity of Tissue Post Debridement is: Fat layer exposed. Post procedure Diagnosis Wound #1: Same as Pre-Procedure Wound #2 Pre-procedure diagnosis of Wound #2 is a Dehisced Wound located on the Right,Medial Amputation Site - Transmetatarsal . There was a Excisional Skin/Subcutaneous Tissue Debridement with a total area of 0.5 sq cm performed by Geralyn Corwin, DO. With the following instrument(s): Curette to remove Viable and Non-Viable tissue/material. Material removed includes Callus, Subcutaneous Tissue, and Slough after achieving pain control using Lidocaine 4% T opical Solution. A time out was conducted at 11:58, prior to the start of the procedure. A Minimum amount of bleeding was controlled with Pressure. The procedure was tolerated well with a pain level of 0 throughout and a pain level of 0 following the procedure. Post Debridement Measurements: 0.3cm length x 0.7cm width x 0.3cm depth; 0.049cm^3 volume. Character of Wound/Ulcer Post Debridement requires further debridement. Post procedure Diagnosis Wound #2: Same as Pre-Procedure Plan Follow-up Appointments: Return Appointment in 1 week. - Dr. Mikey Bussing and Lennox Laity, RN Room 7 07/30/2021 245pm Other: - Pick up oral antibiotics today and start. From your pharmacy. If fever, chills,  worsening pain, or redness at foot go to the emergency department. Call [employer] to pick up wound vac. Bathing/ Shower/ Hygiene: Other Bathing/Shower/Hygiene Orders/Instructions: - Clean with Saline or Dakins Negative Presssure Wound Therapy: Discontinue wound vac - Call [employer] and return wound vac. Edema Control - Lymphedema / SCD / Other: Elevate legs to the level of the heart or above for 30 minutes daily and/or when sitting, a frequency of: - throughout the day Avoid standing for long periods of time. Moisturize legs daily. Off-Loading: Open toe surgical shoe to: - May use surgical shoe Additional Orders / Instructions: Follow Nutritious Diet - -Monitor/Control Blood Sugar -High Protein Diet The following medication(s) was prescribed: Bactrim DS  oral 800 mg-160 mg tablet 1 1 tablet oral BID x 7 days starting 07/24/2021 WOUND #1: - Amputation Site - Transmetatarsal Wound Laterality: Right Cleanser: Normal Saline (Generic) 1 x Per Day/30 Days Discharge Instructions: Cleanse the wound with Normal Saline prior to applying a clean dressing using gauze sponges, not tissue or cotton balls. Peri-Wound Care: Skin Prep 1 x Per Day/30 Days Discharge Instructions: Use skin prep as directed Prim Dressing: MediHoney Gel, tube 1.5 (oz) 1 x Per Day/30 Days ary Discharge Instructions: Apply to wound bed as instructed Secondary Dressing: ABD Pad, 5x9 1 x Per Day/30 Days Discharge Instructions: Apply over primary dressing as directed. Secondary Dressing: Woven Gauze Sponge, Non-Sterile 4x4 in 1 x Per Day/30 Days Discharge Instructions: Apply over primary dressing as directed. Secured With: Elastic Bandage 4 inch (ACE bandage) (Generic) 1 x Per Day/30 Days Discharge Instructions: Secure with ACE bandage as directed. Secured With: [employer], 4.5x3.1 (in/yd) (Generic) 1 x Per Day/30 Days Discharge Instructions: Secure with Kerlix as directed. WOUND #2: - Amputation Site -  Transmetatarsal Wound Laterality: Right, Medial Cleanser: Normal Saline (Generic) 1 x Per Day/30 Days Discharge Instructions: Cleanse the wound with Normal Saline prior to applying a clean dressing using gauze sponges, not tissue or cotton balls. Peri-Wound Care: Skin Prep 1 x Per Day/30 Days Discharge Instructions: Use skin prep as directed Prim Dressing: MediHoney Gel, tube 1.5 (oz) 1 x Per Day/30 Days ary Discharge Instructions: Apply to wound bed as instructed Secondary Dressing: ABD Pad, 5x9 1 x Per Day/30 Days Discharge Instructions: Apply over primary dressing as directed. Secondary Dressing: Woven Gauze Sponge, Non-Sterile 4x4 in 1 x Per Day/30 Days Discharge Instructions: Apply over primary dressing as directed. Secured With: Elastic Bandage 4 inch (ACE bandage) (Generic) 1 x Per Day/30 Days Discharge Instructions: Secure with ACE bandage as directed. Secured With: [employer], 4.5x3.1 (in/yd) (Generic) 1 x Per Day/30 Days Discharge Instructions: Secure with Kerlix as directed. 1. Medihoney 2. In office sharp debridement 3. Bactrim 4. Follow-up in 1 week Electronic Signature(s) Signed: 07/24/2021 2:39:21 PM By: Geralyn Corwin DO Entered By: Geralyn Corwin on 07/24/2021 14:37:58 -------------------------------------------------------------------------------- HxROS Details Patient Name: Date of Service: [name], [name] 07/24/2021 11:15 A M Medical Record Number: [id number] Patient Account Number: [id number] Date of Birth/Sex: Treating RN: 1980-03-08 (41 y.o. [name] Primary Care Provider: Gwinda Passe Other Clinician: Referring Provider: Treating Provider/Extender: Grace Isaac in Treatment: 26 Information Obtained From Patient Cardiovascular Medical History: Positive for: Hypertension Endocrine Medical History: Positive for: Type II Diabetes Time with diabetes: Dx 2009 Treated with: Insulin, Oral  agents Blood sugar tested every day: Yes Tested : daily Musculoskeletal Medical History: Positive for: Osteomyelitis - Right Transmet 12/18/20 Neurologic Medical History: Positive for: Neuropathy Immunizations Pneumococcal Vaccine: Received Pneumococcal Vaccination: No Implantable Devices Yes Family and Social History Cancer: Yes - Paternal Grandparents; Diabetes: Yes - Mother; Heart Disease: No; Hereditary Spherocytosis: No; Hypertension: Yes - Mother; Kidney Disease: No; Lung Disease: No; Seizures: No; Stroke: Yes - Maternal Grandparents; Thyroid Problems: No; Tuberculosis: No; Never smoker; Marital Status - Single; Alcohol Use: Rarely; Drug Use: Prior History - Marijuana; Caffeine Use: Daily; Financial Concerns: No; Food, Clothing or Shelter Needs: No; Support System Lacking: No; Transportation Concerns: No Electronic Signature(s) Signed: 07/24/2021 2:39:21 PM By: Geralyn Corwin DO Signed: 07/24/2021 6:35:30 PM By: Shawn Stall RN, BSN Entered By: Geralyn Corwin on 07/24/2021 14:32:33 -------------------------------------------------------------------------------- SuperBill Details Patient Name: Date of Service: [name], MA RKIA  L. 07/24/2021 Medical Record Number: [id number] Patient Account Number: [id number] Date of Birth/Sex: Treating RN: Mar 23, 1980 (41 y.o. [name], [name] Primary Care Provider: Gwinda Passe Other Clinician: Referring Provider: Treating Provider/Extender: Grace Isaac in Treatment: 26 Diagnosis Coding ICD-10 Codes Code Description [phone] Non-pressure chronic ulcer of other part of right foot with necrosis of bone E11.621 Type 2 diabetes mellitus with foot ulcer M86.9 Osteomyelitis, unspecified L03.115 Cellulitis of right lower limb Facility Procedures Physician Procedures : CPT4 Code Description Modifier [id number] - WC PHYS LEVEL 4 - EST PT ICD-10 Diagnosis Description L03.115 Cellulitis of right lower limb  L97.514 Non-pressure chronic ulcer of other part of right foot with necrosis of bone E11.621 Type 2 diabetes  mellitus with foot ulcer M86.9 Osteomyelitis, unspecified Quantity: 1 : [id number] - WC PHYS SUBQ TISS 20 SQ CM ICD-10 Diagnosis Description L97.514 Non-pressure chronic ulcer of other part of right foot with necrosis of bone Quantity: 1 Electronic Signature(s) Signed: 07/24/2021 2:39:21 PM By: Geralyn Corwin DO Entered By: Geralyn Corwin on 07/24/2021 14:38:37

## 2021-07-27 ENCOUNTER — Other Ambulatory Visit: Payer: Self-pay

## 2021-07-30 ENCOUNTER — Other Ambulatory Visit: Payer: Self-pay

## 2021-07-30 ENCOUNTER — Encounter (HOSPITAL_BASED_OUTPATIENT_CLINIC_OR_DEPARTMENT_OTHER): Payer: Medicaid Other | Admitting: Internal Medicine

## 2021-07-30 DIAGNOSIS — M869 Osteomyelitis, unspecified: Secondary | ICD-10-CM

## 2021-07-30 DIAGNOSIS — L97514 Non-pressure chronic ulcer of other part of right foot with necrosis of bone: Secondary | ICD-10-CM | POA: Diagnosis not present

## 2021-07-30 DIAGNOSIS — E11621 Type 2 diabetes mellitus with foot ulcer: Secondary | ICD-10-CM | POA: Diagnosis not present

## 2021-07-30 MED ORDER — SULFAMETHOXAZOLE-TRIMETHOPRIM 800-160 MG PO TABS
ORAL_TABLET | ORAL | 0 refills | Status: DC
  Filled 2021-07-30: qty 14, 7d supply, fill #0

## 2021-07-30 NOTE — Progress Notes (Signed)
[name], [name] ([id number]) Visit Report for 07/30/2021 Arrival Information Details Patient Name: Date of Service: [name], [name] [name] 07/30/2021 2:45 PM Medical Record Number: [id number] Patient Account Number: [id number] Date of Birth/Sex: Treating RN: May 13, 1980 (41 y.o. Female) Lorrin Jackson Primary Care Jahayra Mazo: Juluis Mire Other Clinician: Referring Amara Justen: Treating Chasiti Waddington/Extender: Edmonia Lynch in Treatment: 27 Visit Information History Since Last Visit Added or deleted any medications: No Patient Arrived: [name] Any new allergies or adverse reactions: No Arrival Time: 14:26 Had a fall or experienced change in No Accompanied By: mom activities of daily living that may affect Transfer Assistance: None risk of falls: Patient Identification Verified: Yes Signs or symptoms of abuse/neglect since last visito No Secondary Verification Process Completed: Yes Hospitalized since last visit: No Patient Requires Transmission-Based No Implantable device outside of the clinic excluding No Precautions: cellular tissue based products placed in the center Patient Has Alerts: Yes since last visit: Patient Alerts: Patient on Blood Thinner Has Dressing in Place as Prescribed: Yes PICC R Arm Pain Present Now: Yes ABI 12/17/20 R=1.08 L=1.13 Electronic Signature(s) Signed: 07/30/2021 3:44:53 PM By: Erenest Blank Entered By: Erenest Blank on 07/30/2021 14:28:04 -------------------------------------------------------------------------------- Encounter Discharge Information Details Patient Name: Date of Service: [name] V IS, [name] 07/30/2021 2:45 PM Medical Record Number: [id number] Patient Account Number: [id number] Date of Birth/Sex: Treating RN: 10-14-1980 (41 y.o. Female) Lorrin Jackson Primary Care Pattye Meda: Juluis Mire Other Clinician: Referring Dominic Mahaney: Treating Girard Koontz/Extender: Edmonia Lynch in  Treatment: 27 Encounter Discharge Information Items Post Procedure Vitals Discharge Condition: Stable Temperature (F): 98.2 Ambulatory Status: Walker Pulse (bpm): 83 Discharge Destination: Home Respiratory Rate (breaths/min): 18 Transportation: Private Auto Blood Pressure (mmHg): 124/75 Accompanied By: mother Schedule Follow-up Appointment: Yes Clinical Summary of Care: Provided on 07/30/2021 Form Type Recipient Paper Patient Patient Electronic Signature(s) Signed: 07/30/2021 3:44:44 PM By: Lorrin Jackson Entered By: Lorrin Jackson on 07/30/2021 15:35:38 -------------------------------------------------------------------------------- Lower Extremity Assessment Details Patient Name: Date of Service: [name] V IS, [name] 07/30/2021 2:45 PM Medical Record Number: [id number] Patient Account Number: [id number] Date of Birth/Sex: Treating RN: 1980-05-09 (41 y.o. Female) Lorrin Jackson Primary Care Hiilei Gerst: Juluis Mire Other Clinician: Referring Lyman Balingit: Treating Nayali Talerico/Extender: Geryl Councilman Weeks in Treatment: 27 Edema Assessment Assessed: [Left: No] [Right: No] Edema: [Left: Ye] [Right: s] Calf Left: Right: Point of Measurement: 34 cm From Medial Instep 50.4 cm Ankle Left: Right: Point of Measurement: 8 cm From Medial Instep 28.5 cm Electronic Signature(s) Signed: 07/30/2021 3:44:44 PM By: Lorrin Jackson Signed: 07/30/2021 3:44:53 PM By: Erenest Blank Entered By: Erenest Blank on 07/30/2021 14:44:20 -------------------------------------------------------------------------------- Multi Wound Chart Details Patient Name: Date of Service: [name] V IS, [name] 07/30/2021 2:45 PM Medical Record Number: [id number] Patient Account Number: [id number] Date of Birth/Sex: Treating RN: 11-Oct-1980 (41 y.o. Female) Lorrin Jackson Primary Care Hedi Barkan: Juluis Mire Other Clinician: Referring Safiatou Islam: Treating Meliza Kage/Extender: Edmonia Lynch in Treatment: 27 Vital Signs Height(in): 53 Pulse(bpm): 62 Weight(lbs): Blood Pressure(mmHg): 124/75 Body Mass Index(BMI): Temperature(F): 98.2 Respiratory Rate(breaths/min): 18 Photos: [N/A:N/A] Right Amputation Site - Right, Medial Amputation Site - N/A Wound Location: Transmetatarsal Transmetatarsal Surgical Injury Surgical Injury N/A Wounding Event: Diabetic Wound/Ulcer of the Lower Dehisced Wound N/A Primary Etiology: Extremity Hypertension, Type II Diabetes, Hypertension, Type II Diabetes, N/A Comorbid History: Osteomyelitis, Neuropathy Osteomyelitis, Neuropathy 12/15/2020 07/10/2021 N/A Date Acquired: 27 2 N/A Weeks of Treatment: Open Open N/A Wound Status: No No N/A Wound Recurrence: 0.5x1.2x0.2 0.2x0.9x0.2 N/A Measurements L x  W x D (cm) 0.471 0.141 N/A A (cm) : rea 0.094 0.028 N/A Volume (cm) : 98.90% -19.50% N/A % Reduction in A rea: 99.90% 20.00% N/A % Reduction in Volume: 3 Position 1 (o'clock): 2 Maximum Distance 1 (cm): No Yes N/A Tunneling: Grade 3 Full Thickness Without Exposed N/A Classification: Support Structures Medium Medium N/A Exudate A mount: Serosanguineous Serosanguineous N/A Exudate Type: red, brown red, brown N/A Exudate Color: Thickened Distinct, outline attached N/A Wound Margin: Small (1-33%) Large (67-100%) N/A Granulation A mount: Pink, Pale Red, Pink N/A Granulation Quality: Large (67-100%) Small (1-33%) N/A Necrotic A mount: Fat Layer (Subcutaneous Tissue): Yes Fat Layer (Subcutaneous Tissue): Yes N/A Exposed Structures: Fascia: No Fascia: No Tendon: No Tendon: No Muscle: No Muscle: No Joint: No Joint: No Bone: No Bone: No Medium (34-66%) None N/A Epithelialization: Debridement - Excisional Debridement - Excisional N/A Debridement: Pre-procedure Verification/Time Out 15:09 15:09 N/A Taken: Lidocaine 4% Topical Solution Lidocaine 4% Topical Solution N/A Pain  Control: Callus, Subcutaneous, Slough Callus, Subcutaneous, Slough N/A Tissue Debrided: Skin/Subcutaneous Tissue Skin/Subcutaneous Tissue N/A Level: 0.6 0.18 N/A Debridement A (sq cm): rea Curette, Forceps, Scissors Curette, Forceps, Scissors N/A Instrument: Minimum Minimum N/A Bleeding: Pressure Pressure N/A Hemostasis A chieved: Procedure was tolerated well Procedure was tolerated well N/A Debridement Treatment Response: 0.5x1.2x0.2 0.2x0.9x0.2 N/A Post Debridement Measurements L x W x D (cm) 0.094 0.028 N/A Post Debridement Volume: (cm) Debridement Debridement N/A Procedures Performed: Treatment Notes Wound #1 (Amputation Site - Transmetatarsal) Wound Laterality: Right Cleanser Normal Saline Discharge Instruction: Cleanse the wound with Normal Saline prior to applying a clean dressing using gauze sponges, not tissue or cotton balls. Peri-Wound Care Topical Primary Dressing MediHoney Gel, tube 1.5 (oz) Discharge Instruction: Apply to wound bed as instructed Secondary Dressing ABD Pad, 5x9 Discharge Instruction: Apply over primary dressing as directed. Woven Gauze Sponge, Non-Sterile 4x4 in Discharge Instruction: Apply over primary dressing as directed. Secured With Elastic Bandage 4 inch (ACE bandage) Discharge Instruction: Secure with ACE bandage as directed. Kerlix Roll Sterile, 4.5x3.1 (in/yd) Discharge Instruction: Secure with Kerlix as directed. Compression Wrap Compression Stockings Add-Ons Wound #2 (Amputation Site - Transmetatarsal) Wound Laterality: Right, Medial Cleanser Normal Saline Discharge Instruction: Cleanse the wound with Normal Saline prior to applying a clean dressing using gauze sponges, not tissue or cotton balls. Peri-Wound Care Topical Primary Dressing MediHoney Gel, tube 1.5 (oz) Discharge Instruction: Apply to wound bed as instructed Secondary Dressing ABD Pad, 5x9 Discharge Instruction: Apply over primary dressing as  directed. Woven Gauze Sponge, Non-Sterile 4x4 in Discharge Instruction: Apply over primary dressing as directed. Secured With Elastic Bandage 4 inch (ACE bandage) Discharge Instruction: Secure with ACE bandage as directed. Kerlix Roll Sterile, 4.5x3.1 (in/yd) Discharge Instruction: Secure with Kerlix as directed. Compression Wrap Compression Stockings Add-Ons Electronic Signature(s) Signed: 07/30/2021 3:44:44 PM By: Lorrin Jackson Signed: 07/30/2021 3:52:49 PM By: Kalman Shan DO Entered By: Kalman Shan on 07/30/2021 15:37:18 -------------------------------------------------------------------------------- Multi-Disciplinary Care Plan Details Patient Name: Date of Service: [name] V IS, [name] 07/30/2021 2:45 PM Medical Record Number: [id number] Patient Account Number: [id number] Date of Birth/Sex: Treating RN: Jun 12, 1980 (41 y.o. Female) Lorrin Jackson Primary Care Chantilly Linskey: Juluis Mire Other Clinician: Referring Wolfgang Finigan: Treating Jonny Longino/Extender: Edmonia Lynch in Treatment: 27 Active Inactive Nutrition Nursing Diagnoses: Impaired glucose control: actual or potential Goals: Patient/caregiver verbalizes understanding of need to maintain therapeutic glucose control per primary care physician Date Initiated: 01/22/2021 Target Resolution Date: 08/07/2021 Goal Status: Active Interventions: Assess HgA1c results as ordered upon admission and as  needed Provide education on elevated blood sugars and impact on wound healing Treatment Activities: Obtain HgA1c : 01/22/2021 Notes: 07/10/21: Glucose control ongoing. Wound/Skin Impairment Nursing Diagnoses: Impaired tissue integrity Goals: Patient/caregiver will verbalize understanding of skin care regimen Date Initiated: 01/22/2021 Target Resolution Date: 08/07/2021 Goal Status: Active Ulcer/skin breakdown will have a volume reduction of 30% by week 4 Date Initiated: 01/22/2021 Date Inactivated:  03/19/2021 Target Resolution Date: 03/16/2021 Goal Status: Met Ulcer/skin breakdown will have a volume reduction of 50% by week 8 Date Initiated: 03/19/2021 Date Inactivated: 05/14/2021 Target Resolution Date: 04/16/2021 Unmet Reason: see wound Goal Status: Unmet measurements Interventions: Assess patient/caregiver ability to obtain necessary supplies Assess patient/caregiver ability to perform ulcer/skin care regimen upon admission and as needed Assess ulceration(s) every visit Provide education on ulcer and skin care Treatment Activities: Topical wound management initiated : 01/22/2021 Notes: 06/04/21: Wound vac started 07/10/21: Wound care regimen continues Electronic Signature(s) Signed: 07/30/2021 3:04:49 PM By: Lorrin Jackson Entered By: Lorrin Jackson on 07/30/2021 15:04:49 -------------------------------------------------------------------------------- Pain Assessment Details Patient Name: Date of Service: [name] [name] IS, [name] 07/30/2021 2:45 PM Medical Record Number: [id number] Patient Account Number: [id number] Date of Birth/Sex: Treating RN: 29-Dec-1980 (41 y.o. Female) Lorrin Jackson Primary Care Virgilia Quigg: Juluis Mire Other Clinician: Referring Guy Seese: Treating Elizeo Rodriques/Extender: Edmonia Lynch in Treatment: 27 Active Problems Location of Pain Severity and Description of Pain Patient Has Paino Yes Site Locations Pain Location: Pain in Ulcers Rate the pain. Current Pain Level: 2 Pain Management and Medication Current Pain Management: Electronic Signature(s) Signed: 07/30/2021 3:44:44 PM By: Lorrin Jackson Signed: 07/30/2021 3:44:53 PM By: Erenest Blank Entered By: Erenest Blank on 07/30/2021 14:28:20 -------------------------------------------------------------------------------- Patient/Caregiver Education Details Patient Name: Date of Service: [name] [name], [name] 7/17/2023andnbsp2:45 PM Medical Record Number: [id number] Patient  Account Number: [id number] Date of Birth/Gender: Treating RN: 11/08/1980 (41 y.o. Female) Lorrin Jackson Primary Care Physician: Juluis Mire Other Clinician: Referring Physician: Treating Physician/Extender: Edmonia Lynch in Treatment: 27 Education Assessment Education Provided To: Patient Education Topics Provided Elevated Blood Sugar/ Impact on Healing: Methods: Explain/Verbal, Printed Responses: State content correctly Wound/Skin Impairment: Methods: Demonstration, Explain/Verbal, Printed Responses: State content correctly Electronic Signature(s) Signed: 07/30/2021 3:44:44 PM By: Lorrin Jackson Entered By: Lorrin Jackson on 07/30/2021 15:05:07 -------------------------------------------------------------------------------- Wound Assessment Details Patient Name: Date of Service: [name] [name] IS, [name] 07/30/2021 2:45 PM Medical Record Number: [id number] Patient Account Number: [id number] Date of Birth/Sex: Treating RN: 1980/07/03 (41 y.o. 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 Wound Status: Open Wounding Event: Surgical Injury Comorbid Hypertension, Type II Diabetes, Osteomyelitis, History: Neuropathy Date Acquired: 12/15/2020 Weeks Of Treatment: 27 Clustered Wound: No Photos Wound Measurements Length: (cm) 0.5 Width: (cm) 1.2 Depth: (cm) 0.2 Area: (cm) 0.471 Volume: (cm) 0.094 % Reduction in Area: 98.9% % Reduction in Volume: 99.9% Epithelialization: Medium (34-66%) Tunneling: No Undermining: No Wound Description Classification: Grade 3 Wound Margin: Thickened Exudate Amount: Medium Exudate Type: Serosanguineous Exudate Color: red,  brown Foul Odor After Cleansing: No Slough/Fibrino Yes Wound Bed Granulation Amount: Small (1-33%) Exposed Structure Granulation Quality: Pink, Pale Fascia Exposed: No Necrotic Amount: Large (67-100%) Fat Layer (Subcutaneous Tissue) Exposed: Yes Necrotic Quality: Adherent Slough Tendon Exposed: No Muscle Exposed: No Joint Exposed: No Bone Exposed: No Treatment Notes Wound #1 (Amputation Site - Transmetatarsal)  Wound Laterality: Right Cleanser Normal Saline Discharge Instruction: Cleanse the wound with Normal Saline prior to applying a clean dressing using gauze sponges, not tissue or cotton balls. Peri-Wound Care Topical Primary Dressing MediHoney Gel, tube 1.5 (oz) Discharge Instruction: Apply to wound bed as instructed Secondary Dressing ABD Pad, 5x9 Discharge Instruction: Apply over primary dressing as directed. Woven Gauze Sponge, Non-Sterile 4x4 in Discharge Instruction: Apply over primary dressing as directed. Secured With Elastic Bandage 4 inch (ACE bandage) Discharge Instruction: Secure with ACE bandage as directed. Kerlix Roll Sterile, 4.5x3.1 (in/yd) Discharge Instruction: Secure with Kerlix as directed. Compression Wrap Compression Stockings Add-Ons Electronic Signature(s) Signed: 07/30/2021 3:44:44 PM By: Lorrin Jackson Signed: 07/30/2021 3:44:53 PM By: Erenest Blank Entered By: Erenest Blank on 07/30/2021 14:45:14 -------------------------------------------------------------------------------- Wound Assessment Details Patient Name: Date of Service: [name] V IS, [name] 07/30/2021 2:45 PM Medical Record Number: [id number] Patient Account Number: [id number] Date of Birth/Sex: Treating RN: 15-Feb-1980 (41 y.o. Female) Lorrin Jackson Primary Care Jeanean Hollett: Juluis Mire Other Clinician: Referring Rumeal Cullipher: Treating Chaunce Winkels/Extender: Geryl Councilman Weeks in Treatment: 27 Wound Status Wound Number: 2 Primary Etiology: Dehisced  Wound Wound Location: Right, Medial Amputation Site - Transmetatarsal Wound Status: Open Wounding Event: Surgical Injury Comorbid Hypertension, Type II Diabetes, Osteomyelitis, History: Neuropathy Date Acquired: 07/10/2021 Weeks Of Treatment: 2 Clustered Wound: No Photos Wound Measurements Length: (cm) 0.2 Width: (cm) 0.9 Depth: (cm) 0.2 Area: (cm) 0.141 Volume: (cm) 0.028 % Reduction in Area: -19.5% % Reduction in Volume: 20% Epithelialization: None Tunneling: Yes Position (o'clock): 3 Maximum Distance: (cm) 2 Undermining: No Wound Description Classification: Full Thickness Without Exposed Support Structures Wound Margin: Distinct, outline attached Exudate Amount: Medium Exudate Type: Serosanguineous Exudate Color: red, brown Foul Odor After Cleansing: No Slough/Fibrino Yes Wound Bed Granulation Amount: Large (67-100%) Exposed Structure Granulation Quality: Red, Pink Fascia Exposed: No Necrotic Amount: Small (1-33%) Fat Layer (Subcutaneous Tissue) Exposed: Yes Tendon Exposed: No Muscle Exposed: No Joint Exposed: No Bone Exposed: No Treatment Notes Wound #2 (Amputation Site - Transmetatarsal) Wound Laterality: Right, Medial Cleanser Normal Saline Discharge Instruction: Cleanse the wound with Normal Saline prior to applying a clean dressing using gauze sponges, not tissue or cotton balls. Peri-Wound Care Topical Primary Dressing MediHoney Gel, tube 1.5 (oz) Discharge Instruction: Apply to wound bed as instructed Secondary Dressing ABD Pad, 5x9 Discharge Instruction: Apply over primary dressing as directed. Woven Gauze Sponge, Non-Sterile 4x4 in Discharge Instruction: Apply over primary dressing as directed. Secured With Elastic Bandage 4 inch (ACE bandage) Discharge Instruction: Secure with ACE bandage as directed. Kerlix Roll Sterile, 4.5x3.1 (in/yd) Discharge Instruction: Secure with Kerlix as directed. Compression Wrap Compression  Stockings Add-Ons Electronic Signature(s) Signed: 07/30/2021 3:44:44 PM By: Lorrin Jackson Entered By: Lorrin Jackson on 07/30/2021 15:19:01 -------------------------------------------------------------------------------- Vitals Details Patient Name: Date of Service: [name] V IS, [name] 07/30/2021 2:45 PM Medical Record Number: [id number] Patient Account Number: [id number] Date of Birth/Sex: Treating RN: Mar 04, 1980 (41 y.o. Female) Lorrin Jackson Primary Care Epic Tribbett: Juluis Mire Other Clinician: Referring Constance Hackenberg: Treating Salar Molden/Extender: Edmonia Lynch in Treatment: 27 Vital Signs Time Taken: 14:31 Temperature (F): 98.2 Height (in): 69 Pulse (bpm): 83 Respiratory Rate (breaths/min): 18 Blood Pressure (mmHg): 124/75 Reference Range: 80 - 120 mg / dl Electronic Signature(s) Signed: 07/30/2021 3:44:53 PM By: Erenest Blank Entered By: Erenest Blank on 07/30/2021 14:31:34

## 2021-07-31 NOTE — Progress Notes (Signed)
[name], [name] ([id number]) Visit Report for 07/30/2021 Chief Complaint Document Details Patient Name: Date of Service: [name], [place] [name] 07/30/2021 2:45 PM Medical Record Number: [id number] Patient Account Number: [id number] Date of Birth/Sex: Treating RN: 10/25/1980 (41 y.o. Female) Antonieta IbaBarnhart, Jodi Primary Care Provider: Gwinda PasseEdwards, Michelle Other Clinician: Referring Provider: Treating Provider/Extender: Grace IsaacHoffman, Kimyatta Lecy [name], Michelle Weeks in Treatment: 27 Information Obtained from: Patient Chief Complaint Osteomyelitis of the right foot status post transmetatarsal amputation with surgical site dehiscence Electronic Signature(s) Signed: 07/30/2021 3:52:49 PM By: Geralyn CorwinHoffman, Vitalia Stough DO Entered By: Geralyn CorwinHoffman, Kalsey Lull on 07/30/2021 15:47:50 -------------------------------------------------------------------------------- Debridement Details Patient Name: Date of Service: [name], [name] [name] 07/30/2021 2:45 PM Medical Record Number: [id number] Patient Account Number: [id number] Date of Birth/Sex: Treating RN: 10/25/1980 (41 y.o. Female) Antonieta IbaBarnhart, Jodi Primary Care Provider: Gwinda PasseEdwards, Michelle Other Clinician: Referring Provider: Treating Provider/Extender: Grace IsaacHoffman, Karmon Andis [name], Michelle Weeks in Treatment: 27 Debridement Performed for Assessment: Wound #2 Right,Medial Amputation Site - Transmetatarsal Performed By: Physician Geralyn CorwinHoffman, Marynell Bies, DO Debridement Type: Debridement Level of Consciousness (Pre-procedure): Awake and Alert Pre-procedure Verification/Time Out Yes - 15:09 Taken: Start Time: 15:10 Pain Control: Lidocaine 4% T opical Solution T Area Debrided (L x W): otal 0.2 (cm) x 0.9 (cm) = 0.18 (cm) Tissue and other material debrided: Non-Viable, Callus, Slough, Subcutaneous, Slough Level: Skin/Subcutaneous Tissue Debridement Description: Excisional Instrument: Curette, Forceps, Scissors Bleeding: Minimum Hemostasis Achieved: Pressure End Time: 15:14 Response to  Treatment: Procedure was tolerated well Level of Consciousness (Post- Awake and Alert procedure): Post Debridement Measurements of Total Wound Length: (cm) 0.2 Width: (cm) 0.9 Depth: (cm) 0.2 Volume: (cm) 0.028 Character of Wound/Ulcer Post Debridement: Stable Post Procedure Diagnosis Same as Pre-procedure Notes Procedure scribed for Dr. Mikey BussingHoffman Electronic Signature(s) Signed: 07/30/2021 3:44:44 PM By: Antonieta IbaBarnhart, Jodi Signed: 07/30/2021 3:52:49 PM By: Geralyn CorwinHoffman, Avery Eustice DO Entered By: Antonieta IbaBarnhart, Jodi on 07/30/2021 15:16:50 -------------------------------------------------------------------------------- Debridement Details Patient Name: Date of Service: [name], [name] [name] 07/30/2021 2:45 PM Medical Record Number: [id number] Patient Account Number: [id number] Date of Birth/Sex: Treating RN: 10/25/1980 (41 y.o. Female) Antonieta IbaBarnhart, Jodi Primary Care Provider: Gwinda PasseEdwards, Michelle Other Clinician: Referring Provider: Treating Provider/Extender: Grace IsaacHoffman, [name], Michelle Weeks in Treatment: 27 Debridement Performed for Assessment: Wound #1 Right Amputation Site - Transmetatarsal Performed By: Physician Geralyn CorwinHoffman, Kalaya Infantino, DO Debridement Type: Debridement Severity of Tissue Pre Debridement: Fat layer exposed Level of Consciousness (Pre-procedure): Awake and Alert Pre-procedure Verification/Time Out Yes - 15:09 Taken: Start Time: 15:14 Pain Control: Lidocaine 4% T opical Solution T Area Debrided (L x W): otal 0.5 (cm) x 1.2 (cm) = 0.6 (cm) Tissue and other material debrided: Non-Viable, Callus, Slough, Subcutaneous, Slough Level: Skin/Subcutaneous Tissue Debridement Description: Excisional Instrument: Curette, Forceps, Scissors Bleeding: Minimum Hemostasis Achieved: Pressure End Time: 15:18 Response to Treatment: Procedure was tolerated well Level of Consciousness (Post- Awake and Alert procedure): Post Debridement Measurements of Total Wound Length: (cm) 0.5 Width: (cm)  1.2 Depth: (cm) 0.2 Volume: (cm) 0.094 Character of Wound/Ulcer Post Debridement: Stable Severity of Tissue Post Debridement: Fat layer exposed Post Procedure Diagnosis Same as Pre-procedure Notes Procedure scribed for Dr. Mikey BussingHoffman Electronic Signature(s) Signed: 07/30/2021 3:44:44 PM By: Antonieta IbaBarnhart, Jodi Signed: 07/30/2021 3:52:49 PM By: Geralyn CorwinHoffman, Elmire Amrein DO Entered By: Antonieta IbaBarnhart, Jodi on 07/30/2021 15:18:07 -------------------------------------------------------------------------------- HPI Details Patient Name: Date of Service: [name], [name] [name] 07/30/2021 2:45 PM Medical Record Number: [id number] Patient Account Number: [id number] Date of Birth/Sex: Treating RN: 10/25/1980 (41 y.o. Female) Antonieta IbaBarnhart, Jodi Primary Care Provider: Gwinda PasseEdwards, Michelle Other Clinician: Referring Provider: Treating Provider/Extender: Mikey BussingHoffman,  Tonna Corner, Marcelino Duster Weeks in Treatment: 27 History of Present Illness HPI Description: Admission 01/22/2021 [name] a 41 year old female with a past medical history of insulin-dependent uncontrolled type 2 diabetes with last hemoglobin A1c of 13.5, osteomyelitis of the right foot status post transmetatarsal amputation on 12/18/2020 that presents to the clinic for right foot wound. She has had dehiscence of the surgical site. [name] using wet-to-dry dressings. She has a PICC line and receiving IV ceftriaxone daily for her osteomyelitis. There [name] an end date of 01/27/2021. [name] also taking oral metronidazole. She currently denies systemic signs of infection. 1/19; patient presents for follow-up. She was diagnosed with a DVT to the right lower extremity 2 days ago. [name] on Eliquis now. [name] scheduled to see her infectious disease doctor tomorrow. She has been using Dakin's wet-to-dry dressings. She denies systemic signs of infection. 1/26; patient presents for follow-up. She saw infectious disease on 1/21 started on Augmentin. Her PICC line and IV  ceftriaxone was discontinued. Patient reports stability to her wound. She has been using Dakin's wet-to-dry dressings. She currently denies systemic signs of infection. 2/3; patient presents for follow-up. She continues to use Dakin's wet-to-dry dressings to the wound bed. She saw Dr. Manson Passey with infectious disease yesterday and [name] continuing Augmentin. T entative end date [name] 2/16. Patient reports following up with orthopedics. She states there [name] no further plan from them. She currently denies systemic signs of infection. 2/10; patient presents for follow-up. She continues to use Dakin's wet to dry dressings. [name] scheduled to have her MRI done on 2/14. She states that she had pain to the debridement site from last clinic visit and declines debridement today. She denies systemic signs of infection. She continues to have yellow thick drainage. 2/20; patient presents for follow-up. She continues to use Dakin's wet-to-dry dressings. She obtained her MRI. The results showed an abscess and [name] scheduled to see her orthopedic surgeon on 2/23. She saw infectious disease 2/17 and her antibiotics were extended. She currently denies systemic signs of infection. 3/6; patient presents for follow-up. She had debridement and irrigation of her foot on 03/10/2021 due to abscess noted on MRI. She was started on IV ceftriaxone and oral Flagyl. She has no issues or complaints today. She has been using iodoform packing to the tunnel and Dakin's wet-to-dry to the opening. 03/26/2021: She continues on IV ceftriaxone and oral metronidazole. She has follow-up with infectious disease tomorrow. No significant issues or complaints today. Her mother continues to help her with her wound dressing, using iodoform packing strips into the tunnel and Dakin's to the open portion of the wound. 3/20; patient presents for follow-up. She continues to be on IV ceftriaxone in oral metronidazole. She has been using iodoform to the  tunnel and Dakin's wet-to- dry to the open wound. She denies signs of infection. 3/27; patient presents for follow-up. She no longer has a PICC line. She has been using iodoform to the tunnel and Dakin's wet-to-dry to the open wound. She reports improvement in wound healing. She denies signs of infection. 4/3; patient presents for follow-up. She states she has been using Hydrofera Blue to the open wound and iodoform packing to the tunnel without any issues. She denies signs of infection. 4/18; patient presents for follow-up. She saw infectious disease on 4/11. She has finished her oral antibiotics and completed a total of 6 weeks of antibiotics (this includes IV as well). No further antibiotics needed. She has been  using Hydrofera Blue and iodoform packing. She states that the tunneled area has come in and the iodoform [name] not staying in place anymore. She has no issues or complaints today. She denies signs of infection. 4/24; patient presents for follow-up. She saw Dr. Carlene Coria, plastic surgery to discuss potential skin graft/substitute placement. At this time he thinks that the skin graft would likely not take. He [name] in agreement with trying a wound VAC. Patient has been using Hydrofera Blue dressing changes with no issues. She denies signs of infection. She reports improvement in wound healing. 5/1; patient presents for follow-up. Unfortunately patient did not have insurance when we ran [place] the [place]. There [name] an assistance program and we are trying to get this accommodated for the patient. In the meantime she has been using Hydrofera Blue without any issues. She denies signs of infection. 5/8; patient presents for follow-up. We have not heard back if pico [name] covered by her insurance. She has been using collagen to the wound bed over the past week. She denies signs of infection. 5/18; patient presents for follow-up. She has been using collagen to the wound bed without issues. Again we have not heard  if pico [name] covered by her insurance. She has no issues or complaints today. 5/23; patient presents for follow-up. She has been using collagen to the wound bed. She has no issues or complaints today. She obtained the wound VAC from [facility] and brought this in today. She denies signs of infection. 6/1; patient presents for follow-up. She has been using the wound VAC for the past week. She has had this changed twice since she was last here. She reports more maceration to the periwound. She denies signs of infection. 6/7; right TMA site. There are 2 wounds 0 separated by a bridge of healed tissue. The more lateral area has undermining. Both areas have healthy looking granulation at the base but relative the size of the wound [name] fairly deep. There [name] no exposed bone no evidence of infection. Her wound VAC was put on hold last week because of surrounding skin maceration she has been using collagen this week. She has a modified shoe 6/12; patient presents for follow-up. Last week the wound VAC was reinitiated. She had no issues with the wound VAC itself. Today she has maceration again noted to the surrounding skin. She denies signs of infection. 6/27; patient presents for follow-up. She has been using Medihoney to the wound bed. We took a break from the wound VAC because the periwound was macerated. She still has some areas of maceration to the distal foot where there [name] a callus. She currently denies signs of infection. 7/11; patient presents for follow-up. She has been using Medihoney to the wound bed. She has developed some increased warmth and erythema to the lateral aspect of the right foot. She states this [name] occurred over the past week and there [name] increased pain. No drainage noted. 7/17; patient presents for follow-up. She has been using Medihoney to the wound bed. She completed her course of Bactrim. She reports improvement in symptoms. Electronic Signature(s) Signed: 07/30/2021 3:52:49 PM By:  Geralyn Corwin DO Entered By: Geralyn Corwin on 07/30/2021 15:48:25 -------------------------------------------------------------------------------- Physical Exam Details Patient Name: Date of Service: [name], [name] [name] 07/30/2021 2:45 PM Medical Record Number: [id number] Patient Account Number: [id number] Date of Birth/Sex: Treating RN: 09-Jun-1980 (41 y.o. Female) Antonieta Iba Primary Care Provider: Gwinda Passe Other Clinician: Referring Provider: Treating Provider/Extender: Grace Isaac in  Treatment: 27 Constitutional respirations regular, non-labored and within target range for patient.. Cardiovascular 2+ dorsalis pedis/posterior tibialis pulses. Psychiatric pleasant and cooperative. Notes Right foot: T the transmetatarsal amputation site there [name] wound dehiscence. There are 2 open areas separated by bridge of healed tissue. The wound area [name] o narrowed with granulation tissue and nonviable tissue present. No purulent drainage. T the lateral aspect There [name] a tunnel.No increased warmth or erythema. o There [name] still tenderness on palpation. Electronic Signature(s) Signed: 07/30/2021 3:52:49 PM By: Geralyn Corwin DO Entered By: Geralyn Corwin on 07/30/2021 15:49:10 -------------------------------------------------------------------------------- Physician Orders Details Patient Name: Date of Service: [name], [name] [name] 07/30/2021 2:45 PM Medical Record Number: [id number] Patient Account Number: [id number] Date of Birth/Sex: Treating RN: [birth year] (41 y.o. Female) Antonieta Iba Primary Care Provider: Gwinda Passe Other Clinician: Referring Provider: Treating Provider/Extender: Grace Isaac in Treatment: 27 Verbal / Phone Orders: No Diagnosis Coding ICD-10 Coding Code Description L97.514 Non-pressure chronic ulcer of other part of right foot with necrosis of bone E11.621 Type 2 diabetes mellitus  with foot ulcer M86.9 Osteomyelitis, unspecified Follow-up Appointments ppointment in 1 week. - Dr. Mikey Bussing and [name], RN Room 8 08/06/2021 3:00pm Return A Other: - Bactrim antibiotic extended, pick up at pharmacy. Bathing/ Shower/ Hygiene Other Bathing/Shower/Hygiene Orders/Instructions: - Clean with Saline or Dakins Negative Presssure Wound Therapy Discontinue wound vac - Call [employer] company and return wound vac. Edema Control - Lymphedema / SCD / Other Elevate legs to the level of the heart or above for 30 minutes daily and/or when sitting, a frequency of: - throughout the day Avoid standing for long periods of time. Moisturize legs daily. Off-Loading Open toe surgical shoe to: - May use surgical shoe Additional Orders / Instructions Follow Nutritious Diet - -Monitor/Control Blood Sugar -High Protein Diet Wound Treatment Wound #1 - Amputation Site - Transmetatarsal Wound Laterality: Right Cleanser: Normal Saline (Generic) 1 x Per Day/30 Days Discharge Instructions: Cleanse the wound with Normal Saline prior to applying a clean dressing using gauze sponges, not tissue or cotton balls. Prim Dressing: MediHoney Gel, tube 1.5 (oz) 1 x Per Day/30 Days ary Discharge Instructions: Apply to wound bed as instructed Secondary Dressing: ABD Pad, 5x9 1 x Per Day/30 Days Discharge Instructions: Apply over primary dressing as directed. Secondary Dressing: Woven Gauze Sponge, Non-Sterile 4x4 in 1 x Per Day/30 Days Discharge Instructions: Apply over primary dressing as directed. Secured With: Elastic Bandage 4 inch (ACE bandage) (Generic) 1 x Per Day/30 Days Discharge Instructions: Secure with ACE bandage as directed. Secured With: [employer], 4.5x3.1 (in/yd) (Generic) 1 x Per Day/30 Days Discharge Instructions: Secure with Kerlix as directed. Wound #2 - Amputation Site - Transmetatarsal Wound Laterality: Right, Medial Cleanser: Normal Saline (Generic) 1 x Per Day/30 Days Discharge  Instructions: Cleanse the wound with Normal Saline prior to applying a clean dressing using gauze sponges, not tissue or cotton balls. Prim Dressing: MediHoney Gel, tube 1.5 (oz) 1 x Per Day/30 Days ary Discharge Instructions: Apply to wound bed as instructed Secondary Dressing: ABD Pad, 5x9 1 x Per Day/30 Days Discharge Instructions: Apply over primary dressing as directed. Secondary Dressing: Woven Gauze Sponge, Non-Sterile 4x4 in 1 x Per Day/30 Days Discharge Instructions: Apply over primary dressing as directed. Secured With: Elastic Bandage 4 inch (ACE bandage) (Generic) 1 x Per Day/30 Days Discharge Instructions: Secure with ACE bandage as directed. Secured With: [employer], 4.5x3.1 (in/yd) (Generic) 1 x Per Day/30 Days Discharge Instructions: Secure with Kerlix as  directed. Patient Medications llergies: mango A Notifications Medication Indication Start End 07/30/2021 Bactrim DS DOSE 1 - oral 800 mg-160 mg tablet - 1 tablet oral BID x 7 days Electronic Signature(s) Signed: 07/30/2021 3:52:49 PM By: Geralyn Corwin DO Previous Signature: 07/30/2021 3:36:27 PM Version By: Geralyn Corwin DO Entered By: Geralyn Corwin on 07/30/2021 15:49:20 -------------------------------------------------------------------------------- Problem List Details Patient Name: Date of Service: [name], [name] [name] 07/30/2021 2:45 PM Medical Record Number: [id number] Patient Account Number: [id number] Date of Birth/Sex: Treating RN: Jun 25, 1980 (41 y.o. Female) Antonieta Iba Primary Care Provider: Gwinda Passe Other Clinician: Referring Provider: Treating Provider/Extender: Grace Isaac in Treatment: 27 Active Problems ICD-10 Encounter Code Description Active Date MDM Diagnosis L97.514 Non-pressure chronic ulcer of other part of right foot with necrosis of bone 01/22/2021 No Yes E11.621 Type 2 diabetes mellitus with foot ulcer 01/22/2021 No Yes M86.9  Osteomyelitis, unspecified 01/22/2021 No Yes Inactive Problems Resolved Problems Electronic Signature(s) Signed: 07/30/2021 3:52:49 PM By: Geralyn Corwin DO Previous Signature: 07/30/2021 3:04:35 PM Version By: Antonieta Iba Entered By: Geralyn Corwin on 07/30/2021 15:37:12 -------------------------------------------------------------------------------- Progress Note Details Patient Name: Date of Service: [name], [name] [name] 07/30/2021 2:45 PM Medical Record Number: [id number] Patient Account Number: [id number] Date of Birth/Sex: Treating RN: [birth year] (41 y.o. Female) Antonieta Iba Primary Care Provider: Gwinda Passe Other Clinician: Referring Provider: Treating Provider/Extender: Grace Isaac in Treatment: 27 Subjective Chief Complaint Information obtained from Patient Osteomyelitis of the right foot status post transmetatarsal amputation with surgical site dehiscence History of Present Illness (HPI) Admission 01/22/2021 Ms. Mysty Kielty [name] a 41 year old female with a past medical history of insulin-dependent uncontrolled type 2 diabetes with last hemoglobin A1c of 13.5, osteomyelitis of the right foot status post transmetatarsal amputation on 12/18/2020 that presents to the clinic for right foot wound. She has had dehiscence of the surgical site. [name] using wet-to-dry dressings. She has a PICC line and receiving IV ceftriaxone daily for her osteomyelitis. There [name] an end date of 01/27/2021. [name] also taking oral metronidazole. She currently denies systemic signs of infection. 1/19; patient presents for follow-up. She was diagnosed with a DVT to the right lower extremity 2 days ago. [name] on Eliquis now. [name] scheduled to see her infectious disease doctor tomorrow. She has been using Dakin's wet-to-dry dressings. She denies systemic signs of infection. 1/26; patient presents for follow-up. She saw infectious disease on 1/21 started on  Augmentin. Her PICC line and IV ceftriaxone was discontinued. Patient reports stability to her wound. She has been using Dakin's wet-to-dry dressings. She currently denies systemic signs of infection. 2/3; patient presents for follow-up. She continues to use Dakin's wet-to-dry dressings to the wound bed. She saw Dr. Manson Passey with infectious disease yesterday and [name] continuing Augmentin. T entative end date [name] 2/16. Patient reports following up with orthopedics. She states there [name] no further plan from them. She currently denies systemic signs of infection. 2/10; patient presents for follow-up. She continues to use Dakin's wet to dry dressings. [name] scheduled to have her MRI done on 2/14. She states that she had pain to the debridement site from last clinic visit and declines debridement today. She denies systemic signs of infection. She continues to have yellow thick drainage. 2/20; patient presents for follow-up. She continues to use Dakin's wet-to-dry dressings. She obtained her MRI. The results showed an abscess and [name] scheduled to see her orthopedic surgeon on 2/23. She saw infectious disease 2/17  and her antibiotics were extended. She currently denies systemic signs of infection. 3/6; patient presents for follow-up. She had debridement and irrigation of her foot on 03/10/2021 due to abscess noted on MRI. She was started on IV ceftriaxone and oral Flagyl. She has no issues or complaints today. She has been using iodoform packing to the tunnel and Dakin's wet-to-dry to the opening. 03/26/2021: She continues on IV ceftriaxone and oral metronidazole. She has follow-up with infectious disease tomorrow. No significant issues or complaints today. Her mother continues to help her with her wound dressing, using iodoform packing strips into the tunnel and Dakin's to the open portion of the wound. 3/20; patient presents for follow-up. She continues to be on IV ceftriaxone in oral metronidazole. She has  been using iodoform to the tunnel and Dakin's wet-to- dry to the open wound. She denies signs of infection. 3/27; patient presents for follow-up. She no longer has a PICC line. She has been using iodoform to the tunnel and Dakin's wet-to-dry to the open wound. She reports improvement in wound healing. She denies signs of infection. 4/3; patient presents for follow-up. She states she has been using Hydrofera Blue to the open wound and iodoform packing to the tunnel without any issues. She denies signs of infection. 4/18; patient presents for follow-up. She saw infectious disease on 4/11. She has finished her oral antibiotics and completed a total of 6 weeks of antibiotics (this includes IV as well). No further antibiotics needed. She has been using Hydrofera Blue and iodoform packing. She states that the tunneled area has come in and the iodoform [name] not staying in place anymore. She has no issues or complaints today. She denies signs of infection. 4/24; patient presents for follow-up. She saw Dr. Carlene Coria, plastic surgery to discuss potential skin graft/substitute placement. At this time he thinks that the skin graft would likely not take. He [name] in agreement with trying a wound VAC. Patient has been using Hydrofera Blue dressing changes with no issues. She denies signs of infection. She reports improvement in wound healing. 5/1; patient presents for follow-up. Unfortunately patient did not have insurance when we [place]. There [name] an assistance program and we are trying to get this accommodated for the patient. In the meantime she has been using Hydrofera Blue without any issues. She denies signs of infection. 5/8; patient presents for follow-up. We have not heard back if pico [name] covered by her insurance. She has been using collagen to the wound bed over the past week. She denies signs of infection. 5/18; patient presents for follow-up. She has been using collagen to the wound bed without  issues. Again we have not heard if pico [name] covered by her insurance. She has no issues or complaints today. 5/23; patient presents for follow-up. She has been using collagen to the wound bed. She has no issues or complaints today. [facility] [facility] and brought this in today. She denies signs of infection. 6/1; patient presents for follow-up. She has been using the wound VAC for the past week. She has had this changed twice since she was last here. She reports more maceration to the periwound. She denies signs of infection. 6/7; right TMA site. There are 2 wounds 0 separated by a bridge of healed tissue. The more lateral area has undermining. Both areas have healthy looking granulation at the base but relative the size of the wound [name] fairly deep. There [name] no exposed bone no evidence of  infection. Her wound VAC was put on hold last week because of surrounding skin maceration she has been using collagen this week. She has a modified shoe 6/12; patient presents for follow-up. Last week the wound VAC was reinitiated. She had no issues with the wound VAC itself. Today she has maceration again noted to the surrounding skin. She denies signs of infection. 6/27; patient presents for follow-up. She has been using Medihoney to the wound bed. We took a break from the wound VAC because the periwound was macerated. She still has some areas of maceration to the distal foot where there [name] a callus. She currently denies signs of infection. 7/11; patient presents for follow-up. She has been using Medihoney to the wound bed. She has developed some increased warmth and erythema to the lateral aspect of the right foot. She states this [name] occurred over the past week and there [name] increased pain. No drainage noted. 7/17; patient presents for follow-up. She has been using Medihoney to the wound bed. She completed her course of Bactrim. She reports improvement in symptoms. Patient History Information  obtained from Patient. Family History Cancer - Paternal Grandparents, Diabetes - Mother, Hypertension - Mother, Stroke - Maternal Grandparents, No family history of Heart Disease, Hereditary Spherocytosis, Kidney Disease, Lung Disease, Seizures, Thyroid Problems, Tuberculosis. Social History Never smoker, Marital Status - Single, Alcohol Use - Rarely, Drug Use - Prior History - Marijuana, Caffeine Use - Daily. Medical History Cardiovascular Patient has history of Hypertension Endocrine Patient has history of Type II Diabetes Musculoskeletal Patient has history of Osteomyelitis - Right Transmet 12/18/20 Neurologic Patient has history of Neuropathy Objective Constitutional respirations regular, non-labored and within target range for patient.. Vitals Time Taken: 2:31 PM, Height: 69 in, Temperature: 98.2 F, Pulse: 83 bpm, Respiratory Rate: 18 breaths/min, Blood Pressure: 124/75 mmHg. Cardiovascular 2+ dorsalis pedis/posterior tibialis pulses. Psychiatric pleasant and cooperative. General Notes: Right foot: T the transmetatarsal amputation site there [name] wound dehiscence. There are 2 open areas separated by bridge of healed tissue. The o wound area [name] narrowed with granulation tissue and nonviable tissue present. No purulent drainage. T the lateral aspect There [name] a tunnel.No increased warmth o or erythema. There [name] still tenderness on palpation. Integumentary (Hair, Skin) Wound #1 status [name] Open. Original cause of wound was Surgical Injury. The date acquired was: 12/15/2020. The wound has been in treatment 27 weeks. The wound [name] located on the Right Amputation Site - Transmetatarsal. The wound measures 0.5cm length x 1.2cm width x 0.2cm depth; 0.471cm^2 area and 0.094cm^3 volume. There [name] Fat Layer (Subcutaneous Tissue) exposed. There [name] no tunneling or undermining noted. There [name] a medium amount of serosanguineous drainage noted. The wound margin [name] thickened. There [name] small (1-33%)  pink, pale granulation within the wound bed. There [name] a large (67-100%) amount of necrotic tissue within the wound bed including Adherent Slough. Wound #2 status [name] Open. Original cause of wound was Surgical Injury. The date acquired was: 07/10/2021. The wound has been in treatment 2 weeks. The wound [name] located on the Right,Medial Amputation Site - Transmetatarsal. The wound measures 0.2cm length x 0.9cm width x 0.2cm depth; 0.141cm^2 area and 0.028cm^3 volume. There [name] Fat Layer (Subcutaneous Tissue) exposed. There [name] no undermining noted, however, there [name] tunneling at 3:00 with a maximum distance of 2cm. There [name] a medium amount of serosanguineous drainage noted. The wound margin [name] distinct with the outline attached to the wound base. There [name] large (67-100%) red, pink granulation within the wound bed.  There [name] a small (1-33%) amount of necrotic tissue within the wound bed. Assessment Active Problems ICD-10 Non-pressure chronic ulcer of other part of right foot with necrosis of bone Type 2 diabetes mellitus with foot ulcer Osteomyelitis, unspecified Patient's wound [name] stable. I debrided nonviable tissue. Her symptoms have improved with Bactrim. I will extend this 1 more week since she still has some tenderness on exam and [name] high risk for amputation. I recommended continuing Medihoney. Follow-up in 1 week. Procedures Wound #1 Pre-procedure diagnosis of Wound #1 [name] a Diabetic Wound/Ulcer of the Lower Extremity located on the Right Amputation Site - Transmetatarsal .Severity of Tissue Pre Debridement [name]: Fat layer exposed. There was a Excisional Skin/Subcutaneous Tissue Debridement with a total area of 0.6 sq cm performed by Geralyn Corwin, DO. With the following instrument(s): Curette, Forceps, and Scissors to remove Non-Viable tissue/material. Material removed includes Callus, Subcutaneous Tissue, and Slough after achieving pain control using Lidocaine 4% Topical Solution. No specimens  were taken. A time out was conducted at 15:09, prior to the start of the procedure. A Minimum amount of bleeding was controlled with Pressure. The procedure was tolerated well. Post Debridement Measurements: 0.5cm length x 1.2cm width x 0.2cm depth; 0.094cm^3 volume. Character of Wound/Ulcer Post Debridement [name] stable. Severity of Tissue Post Debridement [name]: Fat layer exposed. Post procedure Diagnosis Wound #1: Same as Pre-Procedure General Notes: Procedure scribed for Dr. Mikey Bussing. Wound #2 Pre-procedure diagnosis of Wound #2 [name] a Dehisced Wound located on the Right,Medial Amputation Site - Transmetatarsal . There was a Excisional Skin/Subcutaneous Tissue Debridement with a total area of 0.18 sq cm performed by Geralyn Corwin, DO. With the following instrument(s): Curette, Forceps, and Scissors to remove Non-Viable tissue/material. Material removed includes Callus, Subcutaneous Tissue, and Slough after achieving pain control using Lidocaine 4% T opical Solution. No specimens were taken. A time out was conducted at 15:09, prior to the start of the procedure. A Minimum amount of bleeding was controlled with Pressure. The procedure was tolerated well. Post Debridement Measurements: 0.2cm length x 0.9cm width x 0.2cm depth; 0.028cm^3 volume. Character of Wound/Ulcer Post Debridement [name] stable. Post procedure Diagnosis Wound #2: Same as Pre-Procedure General Notes: Procedure scribed for Dr. Mikey Bussing. Plan Follow-up Appointments: Return Appointment in 1 week. - Dr. Mikey Bussing and [name], RN Room 8 08/06/2021 3:00pm Other: - Bactrim antibiotic extended, pick up at pharmacy. Bathing/ Shower/ Hygiene: Other Bathing/Shower/Hygiene Orders/Instructions: - Clean with Saline or Dakins Negative Presssure Wound Therapy: Discontinue wound vac - Call [employer] company and return wound vac. Edema Control - Lymphedema / SCD / Other: Elevate legs to the level of the heart or above for 30 minutes daily and/or when sitting,  a frequency of: - throughout the day Avoid standing for long periods of time. Moisturize legs daily. Off-Loading: Open toe surgical shoe to: - May use surgical shoe Additional Orders / Instructions: Follow Nutritious Diet - -Monitor/Control Blood Sugar -High Protein Diet The following medication(s) was prescribed: Bactrim DS oral 800 mg-160 mg tablet 1 1 tablet oral BID x 7 days starting 07/30/2021 WOUND #1: - Amputation Site - Transmetatarsal Wound Laterality: Right Cleanser: Normal Saline (Generic) 1 x Per Day/30 Days Discharge Instructions: Cleanse the wound with Normal Saline prior to applying a clean dressing using gauze sponges, not tissue or cotton balls. Prim Dressing: MediHoney Gel, tube 1.5 (oz) 1 x Per Day/30 Days ary Discharge Instructions: Apply to wound bed as instructed Secondary Dressing: ABD Pad, 5x9 1 x Per Day/30 Days Discharge Instructions: Apply over primary  dressing as directed. Secondary Dressing: Woven Gauze Sponge, Non-Sterile 4x4 in 1 x Per Day/30 Days Discharge Instructions: Apply over primary dressing as directed. Secured With: Elastic Bandage 4 inch (ACE bandage) (Generic) 1 x Per Day/30 Days Discharge Instructions: Secure with ACE bandage as directed. Secured With: [employer], 4.5x3.1 (in/yd) (Generic) 1 x Per Day/30 Days Discharge Instructions: Secure with Kerlix as directed. WOUND #2: - Amputation Site - Transmetatarsal Wound Laterality: Right, Medial Cleanser: Normal Saline (Generic) 1 x Per Day/30 Days Discharge Instructions: Cleanse the wound with Normal Saline prior to applying a clean dressing using gauze sponges, not tissue or cotton balls. Prim Dressing: MediHoney Gel, tube 1.5 (oz) 1 x Per Day/30 Days ary Discharge Instructions: Apply to wound bed as instructed Secondary Dressing: ABD Pad, 5x9 1 x Per Day/30 Days Discharge Instructions: Apply over primary dressing as directed. Secondary Dressing: Woven Gauze Sponge, Non-Sterile 4x4 in 1 x  Per Day/30 Days Discharge Instructions: Apply over primary dressing as directed. Secured With: Elastic Bandage 4 inch (ACE bandage) (Generic) 1 x Per Day/30 Days Discharge Instructions: Secure with ACE bandage as directed. Secured With: [employer], 4.5x3.1 (in/yd) (Generic) 1 x Per Day/30 Days Discharge Instructions: Secure with Kerlix as directed. 1. In office sharp debridement 2. Medihoney 3. Bactrim 4. Follow-up in 1 week Electronic Signature(s) Signed: 07/30/2021 3:52:49 PM By: Geralyn Corwin DO Entered By: Geralyn Corwin on 07/30/2021 15:51:26 -------------------------------------------------------------------------------- HxROS Details Patient Name: Date of Service: [name], [name] [name] 07/30/2021 2:45 PM Medical Record Number: [id number] Patient Account Number: [id number] Date of Birth/Sex: Treating RN: 01-18-1980 (41 y.o. Female) Antonieta Iba Primary Care Provider: Gwinda Passe Other Clinician: Referring Provider: Treating Provider/Extender: Grace Isaac in Treatment: 27 Information Obtained From Patient Cardiovascular Medical History: Positive for: Hypertension Endocrine Medical History: Positive for: Type II Diabetes Time with diabetes: Dx 2009 Treated with: Insulin, Oral agents Blood sugar tested every day: Yes Tested : daily Musculoskeletal Medical History: Positive for: Osteomyelitis - Right Transmet 12/18/20 Neurologic Medical History: Positive for: Neuropathy Immunizations Pneumococcal Vaccine: Received Pneumococcal Vaccination: No Implantable Devices Yes Family and Social History Cancer: Yes - Paternal Grandparents; Diabetes: Yes - Mother; Heart Disease: No; Hereditary Spherocytosis: No; Hypertension: Yes - Mother; Kidney Disease: No; Lung Disease: No; Seizures: No; Stroke: Yes - Maternal Grandparents; Thyroid Problems: No; Tuberculosis: No; Never smoker; Marital Status - Single; Alcohol Use: Rarely; Drug  Use: Prior History - Marijuana; Caffeine Use: Daily; Financial Concerns: No; Food, Clothing or Shelter Needs: No; Support System Lacking: No; Transportation Concerns: No Electronic Signature(s) Signed: 07/30/2021 3:52:49 PM By: Geralyn Corwin DO Signed: 07/31/2021 4:22:32 PM By: Antonieta Iba Entered By: Geralyn Corwin on 07/30/2021 15:48:32 -------------------------------------------------------------------------------- SuperBill Details Patient Name: Date of Service: [name], [name] [name] 07/30/2021 Medical Record Number: [id number] Patient Account Number: [id number] Date of Birth/Sex: Treating RN: July 04, 1980 (41 y.o. Female) Antonieta Iba Primary Care Provider: Gwinda Passe Other Clinician: Referring Provider: Treating Provider/Extender: Grace Isaac in Treatment: 27 Diagnosis Coding ICD-10 Codes Code Description [phone] Non-pressure chronic ulcer of other part of right foot with necrosis of bone E11.621 Type 2 diabetes mellitus with foot ulcer M86.9 Osteomyelitis, unspecified Facility Procedures CPT4 Code: [id number] Description: 11042 - DEB SUBQ TISSUE 20 SQ CM/< ICD-10 Diagnosis Description L97.514 Non-pressure chronic ulcer of other part of right foot with necrosis of bone Modifier: Quantity: 1 Physician Procedures : CPT4 Code Description Modifier [id number] - WC PHYS LEVEL 3 - EST PT ICD-10 Diagnosis Description L97.514 Non-pressure  chronic ulcer of other part of right foot with necrosis of bone E11.621 Type 2 diabetes mellitus with foot ulcer M86.9  Osteomyelitis, unspecified [id number] 11042 - WC PHYS SUBQ TISS 20 SQ CM 1 ICD-10 Diagnosis Description L97.514 Non-pressure chronic ulcer of other part of right foot with necrosis of bone Quantity: 1 Electronic Signature(s) Signed: 07/30/2021 3:52:49 PM By: Geralyn Corwin DO Previous Signature: 07/30/2021 3:44:44 PM Version By: Antonieta Iba Entered By: Geralyn Corwin on 07/30/2021  15:51:47

## 2021-08-02 ENCOUNTER — Ambulatory Visit: Payer: Medicaid Other | Attending: Primary Care | Admitting: Pharmacist

## 2021-08-02 ENCOUNTER — Other Ambulatory Visit: Payer: Self-pay

## 2021-08-02 DIAGNOSIS — E1165 Type 2 diabetes mellitus with hyperglycemia: Secondary | ICD-10-CM

## 2021-08-02 NOTE — Progress Notes (Signed)
S:    [name] is a 41 y.o. female who presents for diabetes evaluation, education, and management.  PMH is significant for HTN, T2DM, diabetic foot infection and osteomyelitis, HLD, obesity, hx of DVT, hx of hyponatremia. Patient was referred and last seen by Primary Care Provider, Gwinda Passe, on 06/26/21. At that visit, A1c had improved to 7.5 from 8.3 and no changes were made to antidiabetic medications. Patient reported not taking BG readings at home.   Today, patient arrives in good spirits and presents without any assistance. Patient states that she does not want to make any medication changes today. She says her appetite has changed drastically with Trulicity and she wants to continue at current dose. She denies N/V and stomach pain. She reports taking Bactrim for several days due to a sore/pain on her right lower leg, but endorses improvement since starting the antibiotic. She also expresses interest in getting another ultrasound of her leg to determine if clot is still present and discuss discontinuing apixaban.    Patient reports Diabetes was diagnosed in 2009.  Family/Social History: former smoker; family hx of DM and HTN  Current diabetes medications include: Trulicity 1.5mg  weekly, Basaglar Kwikpen 60 units daily, metformin 1000mg  BID Current hypertension medications include: lisinopril 2.5mg  daily Current hyperlipidemia medications include: atorvastatin 80mg  daily   Patient reports adherence to taking all medications as prescribed, but she admits to missing some doses. She reports missing one day in the last week. She states she is trying to improve adherence.    Do you feel that your medications are working for you? yes Have you been experiencing any side effects to the medications prescribed? no Insurance coverage: Medicaid   Patient denies hypoglycemic events. She says she does sometimes have shaky hands or feel faint, but doesn't think it is due to low blood  sugar.   Patient reports two home glucose readings: 148 and 169. She is unsure whether she had already eaten or not at the time of readings. She says she does not check every day but is trying to increase frequency.   Patient denies nocturia (nighttime urination).  Patient reports neuropathy (nerve pain). She only notices when she doesn't take her gabapentin correctly. She reports only experiencing in her feet.   Patient denies visual changes. She is unsure if her vision has gotten worse, but says she has worn glasses for a long time and hasn't been to get eye exam in years.  Patient reports self foot exams.   Patient reported dietary habits: avoids sweets most of the time, tries to eat things like burgers without the bread. She expresses how hard it is for her to have a good diet with limited income. She reports having to get fast food sometimes due to cost.   Patient-reported exercise habits: activity level has increased since patient received her walker. She reports walking into the clinic/her appointments now and walking as much as possible.   O:   Lab Results  Component Value Date   HGBA1C 7.5 (A) 06/26/2021   There were no vitals filed for this visit.  Lipid Panel     Component Value Date/Time   CHOL 222 (H) 06/26/2021 1052   TRIG 170 (H) 06/26/2021 1052   HDL 55 06/26/2021 1052   CHOLHDL 4.0 06/26/2021 1052   CHOLHDL 7.1 12/20/2020 0434   VLDL 21 12/20/2020 0434   LDLCALC 137 (H) 06/26/2021 1052    Clinical Atherosclerotic Cardiovascular Disease (ASCVD): No  The 10-year ASCVD  risk score (Arnett DK, et al., 2019) is: 1.3%   Values used to calculate the score:     Age: 53 years     Sex: Female     Is Non-Hispanic African American: Yes     Diabetic: Yes     Tobacco smoker: No     Systolic Blood Pressure: 105 mmHg     Is BP treated: Yes     HDL Cholesterol: 55 mg/dL     Total Cholesterol: 222 mg/dL   Patient is participating in a Managed Medicaid Plan:  Yes    A/P: Diabetes longstanding, currently close to goal of A1c <7. Patient is able to verbalize appropriate hypoglycemia management plan. Medication adherence appears partial. Control is suboptimal due to adherence and dietary indiscretions. Agreed to continue current meds without changes while she continues to work on diet and adherence. -Continued Basaglar Kwikpen (insulin glargine) 60 units daily.  -Continued Trulicity (dulaglutide) 1.5mg  weekly. Discussed option to increase Trulicity dose at appointment in 1 month if BG readings indicate need. Counseled on potential for GLP-1 to lower insulin requirement and help her reach her A1c goal. Counseled that better glycemic control will also help promote wound healing and prevent further foot sores. She is amenable to increasing dose as needed at next visit.  -Continued metformin 1000mg  BID.  -Extensively discussed pathophysiology of diabetes, recommended lifestyle interventions, dietary effects on blood sugar control.  -Counseled on s/sx of and management of hypoglycemia. Instructed patient to check BG the next time she feels shaky/faint.  -Asked patient to take more BG readings at different times throughout the day and bring readings to next appointment.  -Recommended making appointment with her eye doctor due to patient's concern about her vision. -Next A1c anticipated September 2023.   Follow-up ultrasound and plan for apixaban use: -Recommended voicing her concerns at next visit with PCP in September.    ASCVD risk - primary prevention in patient with diabetes. Last LDL is 137 not at goal of October mg/dL. ASCVD risk factors include HTN, DM and 10-year ASCVD risk score of 1.3%. High intensity statin indicated based on amount of LDL lowering required to meet goal.   -Continued atorvastatin 80mg .   Hypertension diagnosed 2023 currently controlled. Blood pressure goal of <130/80 mmHg. Medication adherence partial.  -Continued lisinopril 2.5mg     Written patient instructions provided. Patient verbalized understanding of treatment plan.  Total time in face to face counseling 45 minutes.    Follow-up:  Pharmacist 09/10/21. PCP clinic visit 09/26/21.   Patient seen with  09/12/21, PharmD Candidate [employer]  Coralyn Helling, PharmD, [place], CPP Clinical Pharmacist [facility] [phone]

## 2021-08-06 ENCOUNTER — Encounter (HOSPITAL_BASED_OUTPATIENT_CLINIC_OR_DEPARTMENT_OTHER): Payer: Medicaid Other | Admitting: Internal Medicine

## 2021-08-06 DIAGNOSIS — E11621 Type 2 diabetes mellitus with foot ulcer: Secondary | ICD-10-CM | POA: Diagnosis not present

## 2021-08-06 DIAGNOSIS — L97514 Non-pressure chronic ulcer of other part of right foot with necrosis of bone: Secondary | ICD-10-CM | POA: Diagnosis not present

## 2021-08-06 NOTE — Progress Notes (Signed)
[name], [name] ([id number]) Visit Report for 08/06/2021 Arrival Information Details Patient Name: Date of Service: [name], [name] [name] 08/06/2021 3:00 PM Medical Record Number: [id number] Patient Account Number: [id number] Date of Birth/Sex: Treating RN: 08-19-1980 (41 y.o. Helene Shoe, Meta.Reding Primary Care Andreka Stucki: Juluis Mire Other Clinician: Referring Kitai Purdom: Treating Julliette Frentz/Extender: Edmonia Lynch in Treatment: 28 Visit Information History Since Last Visit Added or deleted any medications: No Patient Arrived: [name] Any new allergies or adverse reactions: No Arrival Time: 14:50 Had a fall or experienced change in No Accompanied By: mother activities of daily living that may affect Transfer Assistance: None risk of falls: Patient Requires Transmission-Based No Signs or symptoms of abuse/neglect since last visito No Precautions: Hospitalized since last visit: No Patient Has Alerts: Yes Implantable device outside of the clinic excluding No Patient Alerts: Patient on Blood Thinner cellular tissue based products placed in the center PICC R Arm since last visit: ABI 12/17/20 R=1.08 Has Dressing in Place as Prescribed: Yes L=1.13 Pain Present Now: Yes Electronic Signature(s) Signed: 08/06/2021 3:30:20 PM By: Erenest Blank Entered By: Erenest Blank on 08/06/2021 14:51:07 -------------------------------------------------------------------------------- Encounter Discharge Information Details Patient Name: Date of Service: [name] V IS, [name] [name] 08/06/2021 3:00 PM Medical Record Number: [id number] Patient Account Number: [id number] Date of Birth/Sex: Treating RN: September 20, 1980 (41 y.o. [name] Primary Care Bion Todorov: Juluis Mire Other Clinician: Referring Valori Hollenkamp: Treating Carmencita Cusic/Extender: Edmonia Lynch in Treatment: 28 Encounter Discharge Information Items Post Procedure Vitals Discharge Condition:  Stable Temperature (F): 98.4 Ambulatory Status: Walker Pulse (bpm): 85 Discharge Destination: Home Respiratory Rate (breaths/min): 18 Transportation: Private Auto Blood Pressure (mmHg): 99/66 Accompanied By: mother Schedule Follow-up Appointment: Yes Clinical Summary of Care: Provided on 08/06/2021 Form Type Recipient Paper Patient Patient Electronic Signature(s) Signed: 08/06/2021 4:30:49 PM By: Lorrin Jackson Entered By: Lorrin Jackson on 08/06/2021 15:53:19 -------------------------------------------------------------------------------- Lower Extremity Assessment Details Patient Name: Date of Service: [name] V IS, [name] [name] 08/06/2021 3:00 PM Medical Record Number: [id number] Patient Account Number: [id number] Date of Birth/Sex: Treating RN: 11/26/1980 (41 y.o. [name] Primary Care Romone Shaff: Juluis Mire Other Clinician: Referring Lanyia Jewel: Treating Durand Wittmeyer/Extender: Geryl Councilman Weeks in Treatment: 28 Edema Assessment Assessed: [Left: No] [Right: Yes] Edema: [Left: Ye] [Right: s] Calf Left: Right: Point of Measurement: 34 cm From Medial Instep 55 cm Ankle Left: Right: Point of Measurement: 8 cm From Medial Instep 27.2 cm Vascular Assessment Pulses: Dorsalis Pedis Palpable: [Right:Yes] Electronic Signature(s) Signed: 08/06/2021 4:30:49 PM By: Lorrin Jackson Entered By: Lorrin Jackson on 08/06/2021 15:10:12 -------------------------------------------------------------------------------- Multi Wound Chart Details Patient Name: Date of Service: [name] V IS, [name] [name] 08/06/2021 3:00 PM Medical Record Number: [id number] Patient Account Number: [id number] Date of Birth/Sex: Treating RN: 1980/09/18 (41 y.o. [name] Primary Care Dennie Vecchio: Juluis Mire Other Clinician: Referring Cherye Gaertner: Treating Rishita Petron/Extender: Edmonia Lynch in Treatment: 28 Vital Signs Height(in): 69 Capillary Blood  Glucose(mg/dl): 168 Weight(lbs): Pulse(bpm): 58 Body Mass Index(BMI): Blood Pressure(mmHg): 99/66 Temperature(F): 98.4 Respiratory Rate(breaths/min): 18 Photos: [1:Right Amputation Site -] [2:Right, Medial Amputation Site -] [3:Right, Anterior Amputation Site -] Wound Location: [1:Transmetatarsal Surgical Injury] [2:Transmetatarsal Surgical Injury] [3:Transmetatarsal Other Lesion] Wounding Event: [1:Diabetic Wound/Ulcer of the Lower] [2:Dehisced Wound] [3:Diabetic Wound/Ulcer of the Lower] Primary Etiology: [1:Extremity Hypertension, Type II Diabetes,] [2:Hypertension, Type II Diabetes,] [3:Extremity Hypertension, Type II Diabetes,] Comorbid History: [1:Osteomyelitis, Neuropathy 12/15/2020] [2:Osteomyelitis, Neuropathy 07/10/2021] [3:Osteomyelitis, Neuropathy 08/06/2021] Date Acquired: [1:28] [2:3] [3:0] Weeks of Treatment: [1:Open] [2:Open] [3:Open] Wound Status: [1:No] [  2:No] [3:No] Wound Recurrence: [1:0.3x0.6x0.3] [2:0.2x0.5x1] [3:0.2x0.3x0.4] Measurements L x W x D (cm) [1:0.141] [2:0.079] [3:0.047] A (cm) : rea [1:0.042] [2:0.079] [3:0.019] Volume (cm) : [1:99.70%] [2:33.10%] [3:40.50%] % Reduction in A [1:rea: 99.90%] [2:-125.70%] [3:75.90%] % Reduction in Volume: [1:Grade 3] [2:Full Thickness Without Exposed] [3:Grade 1] Classification: [1:Medium] [2:Support Structures Medium] [3:Medium] Exudate A mount: [1:Serosanguineous] [2:Serosanguineous] [3:Serosanguineous] Exudate Type: [1:red, brown] [2:red, brown] [3:red, brown] Exudate Color: [1:Thickened] [2:Distinct, outline attached] [3:Distinct, outline attached] Wound Margin: [1:Small (1-33%)] [2:Large (67-100%)] [3:Large (67-100%)] Granulation A mount: [1:Pink, Pale] [2:Red, Pink] [3:Red] Granulation Quality: [1:Large (67-100%)] [2:Small (1-33%)] [3:None Present (0%)] Necrotic A mount: [1:Fat Layer (Subcutaneous Tissue): Yes Fat Layer (Subcutaneous Tissue): Yes Fat Layer (Subcutaneous Tissue): Yes] Exposed  Structures: [1:Fascia: No Tendon: No Muscle: No Joint: No Bone: No Large (67-100%)] [2:Fascia: No Tendon: No Muscle: No Joint: No Bone: No None] [3:Fascia: No Tendon: No Muscle: No Joint: No Bone: No None] Epithelialization: [1:Debridement - Excisional] [2:Debridement - Excisional] [3:Debridement - Excisional] Debridement: Pre-procedure Verification/Time Out 15:18 [2:15:18] [3:15:18] Taken: [1:Lidocaine 4% Topical Solution] [2:Lidocaine 4% Topical Solution] [3:Lidocaine 4% Topical Solution] Pain Control: [1:Subcutaneous, Slough] [2:Subcutaneous, Slough] [3:Subcutaneous, Slough] Tissue Debrided: [1:Skin/Subcutaneous Tissue] [2:Skin/Subcutaneous Tissue] [3:Skin/Subcutaneous Tissue] Level: [1:0.18] [2:0.1] [3:0.06] Debridement A (sq cm): [1:rea Curette] [2:Curette] [3:Curette] Instrument: [1:Minimum] [2:Minimum] [3:Minimum] Bleeding: [1:Pressure] [2:Pressure] [3:Pressure] Hemostasis A chieved: [1:Procedure was tolerated well] [2:Procedure was tolerated well] [3:Procedure was tolerated well] Debridement Treatment Response: [1:0.3x0.6x0.3] [2:0.2x0.5x1] [3:0.2x0.3x0.4] Post Debridement Measurements L x W x D (cm) [1:0.042] [2:0.079] [3:0.019] Post Debridement Volume: (cm) [1:Debridement] [2:Debridement] [3:Debridement] Treatment Notes Electronic Signature(s) Signed: 08/06/2021 4:09:14 PM By: Kalman Shan DO Signed: 08/06/2021 4:56:39 PM By: [name] RN, BSN Entered By: Kalman Shan on 08/06/2021 15:31:25 -------------------------------------------------------------------------------- Marlette Details Patient Name: Date of Service: [name] V IS, [name] [name] 08/06/2021 3:00 PM Medical Record Number: [id number] Patient Account Number: [id number] Date of Birth/Sex: Treating RN: 30-Oct-1980 (41 y.o. [name] Primary Care Maydelin Deming: Juluis Mire Other Clinician: Referring Deatrice Spanbauer: Treating Naomy Esham/Extender: Edmonia Lynch in  Treatment: 28 Active Inactive Nutrition Nursing Diagnoses: Impaired glucose control: actual or potential Goals: Patient/caregiver verbalizes understanding of need to maintain therapeutic glucose control per primary care physician Date Initiated: 01/22/2021 Target Resolution Date: 09/10/2021 Goal Status: Active Interventions: Assess HgA1c results as ordered upon admission and as needed Provide education on elevated blood sugars and impact on wound healing Treatment Activities: Obtain HgA1c : 01/22/2021 Notes: 07/10/21: Glucose control ongoing. Wound/Skin Impairment Nursing Diagnoses: Impaired tissue integrity Goals: Patient/caregiver will verbalize understanding of skin care regimen Date Initiated: 01/22/2021 Target Resolution Date: 09/10/2021 Goal Status: Active Ulcer/skin breakdown will have a volume reduction of 30% by week 4 Date Initiated: 01/22/2021 Date Inactivated: 03/19/2021 Target Resolution Date: 03/16/2021 Goal Status: Met Ulcer/skin breakdown will have a volume reduction of 50% by week 8 Date Initiated: 03/19/2021 Date Inactivated: 05/14/2021 Target Resolution Date: 04/16/2021 Unmet Reason: see wound Goal Status: Unmet measurements Interventions: Assess patient/caregiver ability to obtain necessary supplies Assess patient/caregiver ability to perform ulcer/skin care regimen upon admission and as needed Assess ulceration(s) every visit Provide education on ulcer and skin care Treatment Activities: Topical wound management initiated : 01/22/2021 Notes: 06/04/21: Wound vac started 07/10/21: Wound care regimen continues Electronic Signature(s) Signed: 08/06/2021 4:30:49 PM By: Lorrin Jackson Entered By: Lorrin Jackson on 08/06/2021 14:56:19 -------------------------------------------------------------------------------- Pain Assessment Details Patient Name: Date of Service: [name] [name] IS, [name] [name] 08/06/2021 3:00 PM Medical Record Number: [id number] Patient Account Number:  [id number] Date of Birth/Sex: Treating RN: January 24, 1980 (  41 y.o. [name] Primary Care Sonam Huelsmann: Juluis Mire Other Clinician: Referring Gurtaj Ruz: Treating Kiwanna Spraker/Extender: Edmonia Lynch in Treatment: 28 Active Problems Location of Pain Severity and Description of Pain Patient Has Paino Yes Site Locations Pain Location: Pain Location: Pain in Ulcers Rate the pain. Current Pain Level: 4 Pain Management and Medication Current Pain Management: Electronic Signature(s) Signed: 08/06/2021 3:30:20 PM By: Erenest Blank Signed: 08/06/2021 4:56:39 PM By: [name] RN, BSN Entered By: Erenest Blank on 08/06/2021 14:51:25 -------------------------------------------------------------------------------- Patient/Caregiver Education Details Patient Name: Date of Service: [name] V IS, [name] [name] 7/24/2023andnbsp3:00 PM Medical Record Number: [id number] Patient Account Number: [id number] Date of Birth/Gender: Treating RN: Oct 08, 1980 (41 y.o. [name] Primary Care Physician: Juluis Mire Other Clinician: Referring Physician: Treating Physician/Extender: Edmonia Lynch in Treatment: 28 Education Assessment Education Provided To: Patient Education Topics Provided Elevated Blood Sugar/ Impact on Healing: Methods: Explain/Verbal, Printed Responses: State content correctly Wound/Skin Impairment: Methods: Demonstration, Explain/Verbal, Printed Responses: State content correctly Electronic Signature(s) Signed: 08/06/2021 4:30:49 PM By: Lorrin Jackson Entered By: Lorrin Jackson on 08/06/2021 14:56:47 -------------------------------------------------------------------------------- Wound Assessment Details Patient Name: Date of Service: [name] V IS, [name] [name] 08/06/2021 3:00 PM Medical Record Number: [id number] Patient Account Number: [id number] Date of Birth/Sex: Treating RN: 1980/06/25 (41 y.o. 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 Wound Status: Open Wounding Event: Surgical Injury Comorbid Hypertension, Type II Diabetes, Osteomyelitis, History: Neuropathy Date Acquired: 12/15/2020 Weeks Of Treatment: 28 Clustered Wound: No Photos Wound Measurements Length: (cm) 0.3 Width: (cm) 0.6 Depth: (cm) 0.3 Area: (cm) 0.141 Volume: (cm) 0.042 % Reduction in Area: 99.7% % Reduction in Volume: 99.9% Epithelialization: Large (67-100%) Tunneling: No Undermining: No Wound Description Classification: Grade 3 Wound Margin: Thickened Exudate Amount: Medium Exudate Type: Serosanguineous Exudate Color: red, brown Foul Odor After Cleansing: No Slough/Fibrino Yes Wound Bed Granulation Amount: Small (1-33%) Exposed Structure Granulation Quality: Pink, Pale Fascia Exposed: No Necrotic Amount: Large (67-100%) Fat Layer (Subcutaneous Tissue) Exposed: Yes Necrotic Quality: Adherent Slough Tendon Exposed: No Muscle Exposed: No Joint Exposed: No Bone Exposed: No Treatment Notes Wound #1 (Amputation Site - Transmetatarsal) Wound Laterality: Right Cleanser Normal Saline Discharge Instruction: Cleanse the wound with Normal Saline prior to applying a clean dressing using gauze sponges, not tissue or cotton balls. Peri-Wound Care Topical Primary Dressing MediHoney Gel, tube 1.5 (oz) Discharge Instruction: Apply to wound bed as instructed Secondary Dressing ABD Pad, 5x9 Discharge Instruction: Apply over primary dressing as directed. Woven Gauze Sponge, Non-Sterile 4x4 in Discharge Instruction: Apply over primary dressing as directed. Secured With Elastic Bandage 4 inch (ACE bandage) Discharge  Instruction: Secure with ACE bandage as directed. Kerlix Roll Sterile, 4.5x3.1 (in/yd) Discharge Instruction: Secure with Kerlix as directed. Compression Wrap Compression Stockings Add-Ons Electronic Signature(s) Signed: 08/06/2021 4:30:49 PM By: Lorrin Jackson Entered By: Lorrin Jackson on 08/06/2021 15:10:28 -------------------------------------------------------------------------------- Wound Assessment Details Patient Name: Date of Service: [name] V IS, [name] [name] 08/06/2021 3:00 PM Medical Record Number: [id number] Patient Account Number: [id number] Date of Birth/Sex: Treating RN: 1980/11/06 (41 y.o. [name] Primary Care Danyal Whitenack: Juluis Mire Other Clinician: Referring Lorita Forinash: Treating Sabriah Hobbins/Extender: Geryl Councilman Weeks in Treatment: 28 Wound Status Wound Number: 2 Primary Etiology: Dehisced Wound Wound Location: Right, Medial Amputation Site - Transmetatarsal Wound Status: Open Wounding  Event: Surgical Injury Comorbid Hypertension, Type II Diabetes, Osteomyelitis, History: Neuropathy Date Acquired: 07/10/2021 Weeks Of Treatment: 3 Clustered Wound: No Photos Wound Measurements Length: (cm) 0.2 Width: (cm) 0.5 Depth: (cm) 1 Area: (cm) 0.079 Volume: (cm) 0.079 % Reduction in Area: 33.1% % Reduction in Volume: -125.7% Epithelialization: None Tunneling: No Undermining: No Wound Description Classification: Full Thickness Without Exposed Support Structures Wound Margin: Distinct, outline attached Exudate Amount: Medium Exudate Type: Serosanguineous Exudate Color: red, brown Foul Odor After Cleansing: No Slough/Fibrino Yes Wound Bed Granulation Amount: Large (67-100%) Exposed Structure Granulation Quality: Red, Pink Fascia Exposed: No Necrotic Amount: Small (1-33%) Fat Layer (Subcutaneous Tissue) Exposed: Yes Tendon Exposed: No Muscle Exposed: No Joint Exposed: No Bone Exposed: No Treatment Notes Wound #2 (Amputation  Site - Transmetatarsal) Wound Laterality: Right, Medial Cleanser Normal Saline Discharge Instruction: Cleanse the wound with Normal Saline prior to applying a clean dressing using gauze sponges, not tissue or cotton balls. Peri-Wound Care Topical Primary Dressing Plain packing strip 1/4 (in) Discharge Instruction: Lightly pack as instructed Dakin's Solution 0.25%, 16 (oz) Discharge Instruction: Moisten packing strip with Dakin's solution Secondary Dressing ABD Pad, 5x9 Discharge Instruction: Apply over primary dressing as directed. Woven Gauze Sponge, Non-Sterile 4x4 in Discharge Instruction: Apply over primary dressing as directed. Secured With Elastic Bandage 4 inch (ACE bandage) Discharge Instruction: Secure with ACE bandage as directed. Kerlix Roll Sterile, 4.5x3.1 (in/yd) Discharge Instruction: Secure with Kerlix as directed. Compression Wrap Compression Stockings Add-Ons Electronic Signature(s) Signed: 08/06/2021 4:30:49 PM By: Lorrin Jackson Entered By: Lorrin Jackson on 08/06/2021 15:34:36 -------------------------------------------------------------------------------- Wound Assessment Details Patient Name: Date of Service: [name] V IS, [name] [name] 08/06/2021 3:00 PM Medical Record Number: [id number] Patient Account Number: [id number] Date of Birth/Sex: Treating RN: 1980-02-28 (41 y.o. [name] Primary Care Merle Whitehorn: Juluis Mire Other Clinician: Referring Jamarquis Crull: Treating Rayshad Riviello/Extender: Edmonia Lynch in Treatment: 28 Wound Status Wound Number: 3 Primary Etiology: Diabetic Wound/Ulcer of the Lower Extremity Wound Location: Right, Anterior Amputation Site - Transmetatarsal Wound Status: Open Wounding Event: Other Lesion Comorbid Hypertension, Type II Diabetes, Osteomyelitis, History: Neuropathy Date Acquired: 08/06/2021 Weeks Of Treatment: 0 Clustered Wound: No Photos Wound Measurements Length: (cm) 0.2 Width: (cm)  0.3 Depth: (cm) 0.4 Area: (cm) 0.047 Volume: (cm) 0.019 % Reduction in Area: 0% % Reduction in Volume: 0% Epithelialization: None Tunneling: No Undermining: No Wound Description Classification: Grade 1 Wound Margin: Distinct, outline attached Exudate Amount: Medium Exudate Type: Serosanguineous Exudate Color: red, brown Foul Odor After Cleansing: No Slough/Fibrino No Wound Bed Granulation Amount: Large (67-100%) Exposed Structure Granulation Quality: Red Fascia Exposed: No Necrotic Amount: None Present (0%) Fat Layer (Subcutaneous Tissue) Exposed: Yes Tendon Exposed: No Muscle Exposed: No Joint Exposed: No Bone Exposed: No Treatment Notes Wound #3 (Amputation Site - Transmetatarsal) Wound Laterality: Right, Anterior Cleanser Normal Saline Discharge Instruction: Cleanse the wound with Normal Saline prior to applying a clean dressing using gauze sponges, not tissue or cotton balls. Peri-Wound Care Topical Primary Dressing MediHoney Gel, tube 1.5 (oz) Discharge Instruction: Apply to wound bed as instructed Secondary Dressing ABD Pad, 5x9 Discharge Instruction: Apply over primary dressing as directed. Woven Gauze Sponge, Non-Sterile 4x4 in Discharge Instruction: Apply over primary dressing as directed. Secured With Elastic Bandage 4 inch (ACE bandage) Discharge Instruction: Secure with ACE bandage as directed. Kerlix Roll Sterile, 4.5x3.1 (in/yd) Discharge Instruction: Secure with Kerlix as directed. Compression Wrap Compression Stockings Add-Ons Electronic Signature(s) Signed: 08/06/2021 4:30:49 PM By: Lorrin Jackson Entered By: Lorrin Jackson on 08/06/2021 15:35:18 -------------------------------------------------------------------------------- Vitals  Details Patient Name: Date of Service: [name], [name] [name] 08/06/2021 3:00 PM Medical Record Number: [id number] Patient Account Number: [id number] Date of Birth/Sex: Treating RN: 1980/06/02 (41 y.o. Helene Shoe,  Tammi Klippel Primary Care Lamis Behrmann: Juluis Mire Other Clinician: Referring Naszir Cott: Treating Kuper Rennels/Extender: Edmonia Lynch in Treatment: 28 Vital Signs Time Taken: 14:53 Temperature (F): 98.4 Height (in): 69 Pulse (bpm): 85 Respiratory Rate (breaths/min): 18 Blood Pressure (mmHg): 99/66 Capillary Blood Glucose (mg/dl): 168 Reference Range: 80 - 120 mg / dl Electronic Signature(s) Signed: 08/06/2021 4:30:49 PM By: Lorrin Jackson Entered By: Lorrin Jackson on 08/06/2021 14:57:04

## 2021-08-06 NOTE — Progress Notes (Signed)
[name], [name] ([id number]) Visit Report for 08/06/2021 Chief Complaint Document Details Patient Name: Date of Service: DA [name] IS, [name] [name] 08/06/2021 3:00 PM Medical Record Number: [id number] Patient Account Number: [id number] Date of Birth/Sex: Treating RN: [birth year] (41 y.o. [name] Primary Care Provider: Juluis Mire Other Clinician: Referring Provider: Treating Provider/Extender: Edmonia Lynch in Treatment: 28 Information Obtained from: Patient Chief Complaint Osteomyelitis of the right foot status post transmetatarsal amputation with surgical site dehiscence Electronic Signature(s) Signed: 08/06/2021 4:09:14 PM By: Kalman Shan DO Entered By: Kalman Shan on 08/06/2021 15:31:39 -------------------------------------------------------------------------------- Debridement Details Patient Name: Date of Service: [name], [name] 08/06/2021 3:00 PM Medical Record Number: [id number] Patient Account Number: [id number] Date of Birth/Sex: Treating RN: [birth year] (41 y.o. [name] Primary Care Provider: Juluis Mire Other Clinician: Referring Provider: Treating Provider/Extender: Edmonia Lynch in Treatment: 28 Debridement Performed for Assessment: Wound #2 Right,Medial Amputation Site - Transmetatarsal Performed By: Physician Kalman Shan, DO Debridement Type: Debridement Level of Consciousness (Pre-procedure): Awake and Alert Pre-procedure Verification/Time Out Yes - 15:18 Taken: Start Time: 15:19 Pain Control: Lidocaine 4% T opical Solution T Area Debrided (L x W): otal 0.2 (cm) x 0.5 (cm) = 0.1 (cm) Tissue and other material debrided: Non-Viable, Slough, Subcutaneous, Slough Level: Skin/Subcutaneous Tissue Debridement Description: Excisional Instrument: Curette Bleeding: Minimum Hemostasis Achieved: Pressure End Time: 15:22 Response to Treatment: Procedure was tolerated  well Level of Consciousness (Post- Awake and Alert procedure): Post Debridement Measurements of Total Wound Length: (cm) 0.2 Width: (cm) 0.5 Depth: (cm) 1 Volume: (cm) 0.079 Character of Wound/Ulcer Post Debridement: Stable Post Procedure Diagnosis Same as Pre-procedure Electronic Signature(s) Signed: 08/06/2021 4:09:14 PM By: Kalman Shan DO Signed: 08/06/2021 4:30:49 PM By: Lorrin Jackson Entered By: Lorrin Jackson on 08/06/2021 15:24:08 -------------------------------------------------------------------------------- Debridement Details Patient Name: Date of Service: [name], [name] 08/06/2021 3:00 PM Medical Record Number: [id number] Patient Account Number: [id number] Date of Birth/Sex: Treating RN: [birth year] (41 y.o. [name] Primary Care Provider: Juluis Mire Other Clinician: Referring Provider: Treating Provider/Extender: Edmonia Lynch in Treatment: 28 Debridement Performed for Assessment: Wound #1 Right Amputation Site - Transmetatarsal Performed By: Physician Kalman Shan, DO Debridement Type: Debridement Severity of Tissue Pre Debridement: Fat layer exposed Level of Consciousness (Pre-procedure): Awake and Alert Pre-procedure Verification/Time Out Yes - 15:18 Taken: Start Time: 15:22 Pain Control: Lidocaine 4% T opical Solution T Area Debrided (L x W): otal 0.3 (cm) x 0.6 (cm) = 0.18 (cm) Tissue and other material debrided: Non-Viable, Slough, Subcutaneous, Slough Level: Skin/Subcutaneous Tissue Debridement Description: Excisional Instrument: Curette Bleeding: Minimum Hemostasis Achieved: Pressure End Time: 15:24 Response to Treatment: Procedure was tolerated well Level of Consciousness (Post- Awake and Alert procedure): Post Debridement Measurements of Total Wound Length: (cm) 0.3 Width: (cm) 0.6 Depth: (cm) 0.3 Volume: (cm) 0.042 Character of Wound/Ulcer Post Debridement: Stable Severity of Tissue  Post Debridement: Fat layer exposed Post Procedure Diagnosis Same as Pre-procedure Electronic Signature(s) Signed: 08/06/2021 4:09:14 PM By: Kalman Shan DO Signed: 08/06/2021 4:30:49 PM By: Lorrin Jackson Entered By: Lorrin Jackson on 08/06/2021 15:24:52 -------------------------------------------------------------------------------- Debridement Details Patient Name: Date of Service: [name], [name] 08/06/2021 3:00 PM Medical Record Number: [id number] Patient Account Number: [id number] Date of Birth/Sex: Treating RN: 17-Feb-1980 (41 y.o. [name] Primary Care Provider: Juluis Mire Other Clinician: Referring Provider: Treating Provider/Extender: Edmonia Lynch in Treatment: 28 Debridement Performed for Assessment: Wound #3 Right,Anterior Amputation Site - Transmetatarsal  Performed By: Physician Kalman Shan, DO Debridement Type: Debridement Severity of Tissue Pre Debridement: Fat layer exposed Level of Consciousness (Pre-procedure): Awake and Alert Pre-procedure Verification/Time Out Yes - 15:18 Taken: Start Time: 15:24 Pain Control: Lidocaine 4% T opical Solution T Area Debrided (L x W): otal 0.2 (cm) x 0.3 (cm) = 0.06 (cm) Tissue and other material debrided: Non-Viable, Slough, Subcutaneous, Slough Level: Skin/Subcutaneous Tissue Debridement Description: Excisional Instrument: Curette Bleeding: Minimum Hemostasis Achieved: Pressure End Time: 15:26 Response to Treatment: Procedure was tolerated well Level of Consciousness (Post- Awake and Alert procedure): Post Debridement Measurements of Total Wound Length: (cm) 0.2 Width: (cm) 0.3 Depth: (cm) 0.4 Volume: (cm) 0.019 Character of Wound/Ulcer Post Debridement: Stable Severity of Tissue Post Debridement: Fat layer exposed Post Procedure Diagnosis Same as Pre-procedure Electronic Signature(s) Signed: 08/06/2021 4:09:14 PM By: Kalman Shan DO Signed: 08/06/2021  4:30:49 PM By: Lorrin Jackson Entered By: Lorrin Jackson on 08/06/2021 15:26:11 -------------------------------------------------------------------------------- HPI Details Patient Name: Date of Service: [name], [name] 08/06/2021 3:00 PM Medical Record Number: [id number] Patient Account Number: [id number] Date of Birth/Sex: Treating RN: 04-05-1980 (41 y.o. [name] Primary Care Provider: Juluis Mire Other Clinician: Referring Provider: Treating Provider/Extender: Edmonia Lynch in Treatment: 28 History of Present Illness HPI Description: Admission 01/22/2021 Ms. Reagen Wasik is a 41 year old female with a past medical history of insulin-dependent uncontrolled type 2 diabetes with last hemoglobin A1c of 13.5, osteomyelitis of the right foot status post transmetatarsal amputation on 12/18/2020 that presents to the clinic for right foot wound. She has had dehiscence of the surgical site. She is currently using wet-to-dry dressings. She has a PICC line and receiving IV ceftriaxone daily for her osteomyelitis. There is an end date of 01/27/2021. She is also taking oral metronidazole. She currently denies systemic signs of infection. 1/19; patient presents for follow-up. She was diagnosed with a DVT to the right lower extremity 2 days ago. She is on Eliquis now. She is scheduled to see her infectious disease doctor tomorrow. She has been using Dakin's wet-to-dry dressings. She denies systemic signs of infection. 1/26; patient presents for follow-up. She saw infectious disease on 1/21 started on Augmentin. Her PICC line and IV ceftriaxone was discontinued. Patient reports stability to her wound. She has been using Dakin's wet-to-dry dressings. She currently denies systemic signs of infection. 2/3; patient presents for follow-up. She continues to use Dakin's wet-to-dry dressings to the wound bed. She saw Dr. Yvette Rack with infectious disease yesterday and is  continuing Augmentin. T entative end date is 2/16. Patient reports following up with orthopedics. She states there is no further plan from them. She currently denies systemic signs of infection. 2/10; patient presents for follow-up. She continues to use Dakin's wet to dry dressings. She is scheduled to have her MRI done on 2/14. She states that she had pain to the debridement site from last clinic visit and declines debridement today. She denies systemic signs of infection. She continues to have yellow thick drainage. 2/20; patient presents for follow-up. She continues to use Dakin's wet-to-dry dressings. She obtained her MRI. The results showed an abscess and she is scheduled to see her orthopedic surgeon on 2/23. She saw infectious disease 2/17 and her antibiotics were extended. She currently denies systemic signs of infection. 3/6; patient presents for follow-up. She had debridement and irrigation of her foot on 03/10/2021 due to abscess noted on MRI. She was started on IV ceftriaxone and oral Flagyl. She has no issues or complaints today.  She has been using iodoform packing to the tunnel and Dakin's wet-to-dry to the opening. 03/26/2021: She continues on IV ceftriaxone and oral metronidazole. She has follow-up with infectious disease tomorrow. No significant issues or complaints today. Her mother continues to help her with her wound dressing, using iodoform packing strips into the tunnel and Dakin's to the open portion of the wound. 3/20; patient presents for follow-up. She continues to be on IV ceftriaxone in oral metronidazole. She has been using iodoform to the tunnel and Dakin's wet-to- dry to the open wound. She denies signs of infection. 3/27; patient presents for follow-up. She no longer has a PICC line. She has been using iodoform to the tunnel and Dakin's wet-to-dry to the open wound. She reports improvement in wound healing. She denies signs of infection. 4/3; patient presents for  follow-up. She states she has been using Hydrofera Blue to the open wound and iodoform packing to the tunnel without any issues. She denies signs of infection. 4/18; patient presents for follow-up. She saw infectious disease on 4/11. She has finished her oral antibiotics and completed a total of 6 weeks of antibiotics (this includes IV as well). No further antibiotics needed. She has been using Hydrofera Blue and iodoform packing. She states that the tunneled area has come in and the iodoform is not staying in place anymore. She has no issues or complaints today. She denies signs of infection. 4/24; patient presents for follow-up. She saw Dr. Despina Pole, plastic surgery to discuss potential skin graft/substitute placement. At this time he thinks that the skin graft would likely not take. He is in agreement with trying a wound VAC. Patient has been using Hydrofera Blue dressing changes with no issues. She denies signs of infection. She reports improvement in wound healing. 5/1; patient presents for follow-up. Unfortunately patient did not have insurance when we ran for the pico. There is an assistance program and we are trying to get this accommodated for the patient. In the meantime she has been using Hydrofera Blue without any issues. She denies signs of infection. 5/8; patient presents for follow-up. We have not heard back if pico is covered by her insurance. She has been using collagen to the wound bed over the past week. She denies signs of infection. 5/18; patient presents for follow-up. She has been using collagen to the wound bed without issues. Again we have not heard if pico is covered by her insurance. She has no issues or complaints today. 5/23; patient presents for follow-up. She has been using collagen to the wound bed. She has no issues or complaints today. She obtained the wound VAC from [facility] and brought this in today. She denies signs of infection. 6/1; patient presents for follow-up. She  has been using the wound VAC for the past week. She has had this changed twice since she was last here. She reports more maceration to the periwound. She denies signs of infection. 6/7; right TMA site. There are 2 wounds 0 separated by a bridge of healed tissue. The more lateral area has undermining. Both areas have healthy looking granulation at the base but relative the size of the wound is fairly deep. There is no exposed bone no evidence of infection. Her wound VAC was put on hold last week because of surrounding skin maceration she has been using collagen this week. She has a modified shoe 6/12; patient presents for follow-up. Last week the wound VAC was reinitiated. She had no issues with the wound VAC itself. Today  she has maceration again noted to the surrounding skin. She denies signs of infection. 6/27; patient presents for follow-up. She has been using Medihoney to the wound bed. We took a break from the wound VAC because the periwound was macerated. She still has some areas of maceration to the distal foot where there is a callus. She currently denies signs of infection. 7/11; patient presents for follow-up. She has been using Medihoney to the wound bed. She has developed some increased warmth and erythema to the lateral aspect of the right foot. She states this is occurred over the past week and there is increased pain. No drainage noted. 7/17; patient presents for follow-up. She has been using Medihoney to the wound bed. She completed her course of Bactrim. She reports improvement in symptoms. 7/24; Patient presents for follow up. She has been using Medihoney to the wound bed without issues. She completed another course of Bactrim. She reports improvement in her symptoms but still has some mild tenderness to the medial aspect of the foot. Electronic Signature(s) Signed: 08/06/2021 4:09:14 PM By: Geralyn Corwin DO Entered By: Geralyn Corwin on 08/06/2021  15:34:35 -------------------------------------------------------------------------------- Physical Exam Details Patient Name: Date of Service: [name], [name] 08/06/2021 3:00 PM Medical Record Number: [id number] Patient Account Number: [id number] Date of Birth/Sex: Treating RN: [birth year] (41 y.o. [name] Primary Care Provider: Gwinda Passe Other Clinician: Referring Provider: Treating Provider/Extender: Grace Isaac in Treatment: 28 Constitutional respirations regular, non-labored and within target range for patient.. Cardiovascular 2+ dorsalis pedis/posterior tibialis pulses. Psychiatric pleasant and cooperative. Notes Right foot: T the transmetatarsal amputation site there is wound dehiscence. She has 3 wounds 1 to the Lateral aspect the other to the medial aspect and 1 in o the center of the amputation site. There is nonviable tissue in the wound beds. Postdebridement there is granulation tissue. The medial wound that tunnels. Electronic Signature(s) Signed: 08/06/2021 4:09:14 PM By: Geralyn Corwin DO Entered By: Geralyn Corwin on 08/06/2021 15:36:04 -------------------------------------------------------------------------------- Physician Orders Details Patient Name: Date of Service: [name], [name] 08/06/2021 3:00 PM Medical Record Number: [id number] Patient Account Number: [id number] Date of Birth/Sex: Treating RN: 1980-11-27 (41 y.o. [name] Primary Care Provider: Gwinda Passe Other Clinician: Referring Provider: Treating Provider/Extender: Grace Isaac in Treatment: 28 Verbal / Phone Orders: No Diagnosis Coding ICD-10 Coding Code Description L97.514 Non-pressure chronic ulcer of other part of right foot with necrosis of bone E11.621 Type 2 diabetes mellitus with foot ulcer M86.9 Osteomyelitis, unspecified Follow-up Appointments ppointment in 1 week. - Dr. Mikey Bussing and Lennox Laity,  RN Room 7 08/13/2021 2:45pm Return A Bathing/ Shower/ Hygiene Other Bathing/Shower/Hygiene Orders/Instructions: - Clean with Saline or Dakins Negative Presssure Wound Therapy Discontinue wound vac - Call [employer] company and return wound vac. Edema Control - Lymphedema / SCD / Other Elevate legs to the level of the heart or above for 30 minutes daily and/or when sitting, a frequency of: - throughout the day Avoid standing for long periods of time. Moisturize legs daily. Off-Loading Open toe surgical shoe to: - May use surgical shoe Additional Orders / Instructions Follow Nutritious Diet - -Monitor/Control Blood Sugar -High Protein Diet Wound Treatment Wound #1 - Amputation Site - Transmetatarsal Wound Laterality: Right Cleanser: Normal Saline (Generic) 1 x Per Day/30 Days Discharge Instructions: Cleanse the wound with Normal Saline prior to applying a clean dressing using gauze sponges, not tissue or cotton balls. Prim Dressing: MediHoney Gel, tube 1.5 (oz) 1 x  Per Day/30 Days ary Discharge Instructions: Apply to wound bed as instructed Secondary Dressing: ABD Pad, 5x9 1 x Per Day/30 Days Discharge Instructions: Apply over primary dressing as directed. Secondary Dressing: Woven Gauze Sponge, Non-Sterile 4x4 in 1 x Per Day/30 Days Discharge Instructions: Apply over primary dressing as directed. Secured With: Elastic Bandage 4 inch (ACE bandage) (Generic) 1 x Per Day/30 Days Discharge Instructions: Secure with ACE bandage as directed. Secured With: [employer], 4.5x3.1 (in/yd) (Generic) 1 x Per Day/30 Days Discharge Instructions: Secure with Kerlix as directed. Wound #2 - Amputation Site - Transmetatarsal Wound Laterality: Right, Medial Cleanser: Normal Saline (Generic) 1 x Per Day/30 Days Discharge Instructions: Cleanse the wound with Normal Saline prior to applying a clean dressing using gauze sponges, not tissue or cotton balls. Prim Dressing: Plain packing strip 1/4 (in) 1 x Per  Day/30 Days ary Discharge Instructions: Lightly pack as instructed Prim Dressing: Dakin's Solution 0.25%, 16 (oz) 1 x Per Day/30 Days ary Discharge Instructions: Moisten packing strip with Dakin's solution Secondary Dressing: ABD Pad, 5x9 1 x Per Day/30 Days Discharge Instructions: Apply over primary dressing as directed. Secondary Dressing: Woven Gauze Sponge, Non-Sterile 4x4 in 1 x Per Day/30 Days Discharge Instructions: Apply over primary dressing as directed. Secured With: Elastic Bandage 4 inch (ACE bandage) (Generic) 1 x Per Day/30 Days Discharge Instructions: Secure with ACE bandage as directed. Secured With: [employer], 4.5x3.1 (in/yd) (Generic) 1 x Per Day/30 Days Discharge Instructions: Secure with Kerlix as directed. Wound #3 - Amputation Site - Transmetatarsal Wound Laterality: Right, Anterior Cleanser: Normal Saline (Generic) 1 x Per Day/30 Days Discharge Instructions: Cleanse the wound with Normal Saline prior to applying a clean dressing using gauze sponges, not tissue or cotton balls. Prim Dressing: MediHoney Gel, tube 1.5 (oz) 1 x Per Day/30 Days ary Discharge Instructions: Apply to wound bed as instructed Secondary Dressing: ABD Pad, 5x9 1 x Per Day/30 Days Discharge Instructions: Apply over primary dressing as directed. Secondary Dressing: Woven Gauze Sponge, Non-Sterile 4x4 in 1 x Per Day/30 Days Discharge Instructions: Apply over primary dressing as directed. Secured With: Elastic Bandage 4 inch (ACE bandage) (Generic) 1 x Per Day/30 Days Discharge Instructions: Secure with ACE bandage as directed. Secured With: [employer], 4.5x3.1 (in/yd) (Generic) 1 x Per Day/30 Days Discharge Instructions: Secure with Kerlix as directed. Electronic Signature(s) Signed: 08/06/2021 4:09:14 PM By: Kalman Shan DO Entered By: Kalman Shan on 08/06/2021 15:36:13 -------------------------------------------------------------------------------- Problem List  Details Patient Name: Date of Service: [name], [name] 08/06/2021 3:00 PM Medical Record Number: [id number] Patient Account Number: [id number] Date of Birth/Sex: Treating RN: [birth year] (41 y.o. [name] Primary Care Provider: Juluis Mire Other Clinician: Referring Provider: Treating Provider/Extender: Edmonia Lynch in Treatment: 28 Active Problems ICD-10 Encounter Code Description Active Date MDM Diagnosis L97.514 Non-pressure chronic ulcer of other part of right foot with necrosis of bone 01/22/2021 No Yes E11.621 Type 2 diabetes mellitus with foot ulcer 01/22/2021 No Yes M86.9 Osteomyelitis, unspecified 01/22/2021 No Yes Inactive Problems Resolved Problems Electronic Signature(s) Signed: 08/06/2021 4:09:14 PM By: Kalman Shan DO Previous Signature: 08/06/2021 3:08:01 PM Version By: Kalman Shan DO Entered By: Kalman Shan on 08/06/2021 15:31:07 -------------------------------------------------------------------------------- Progress Note Details Patient Name: Date of Service: [name], [name] 08/06/2021 3:00 PM Medical Record Number: [id number] Patient Account Number: [id number] Date of Birth/Sex: Treating RN: [birth year] (41 y.o. [name] Primary Care Provider: Juluis Mire Other Clinician: Referring Provider: Treating Provider/Extender: Kalman Shan  Juluis Mire Weeks in Treatment: 28 Subjective Chief Complaint Information obtained from Patient Osteomyelitis of the right foot status post transmetatarsal amputation with surgical site dehiscence History of Present Illness (HPI) Admission 01/22/2021 Ms. Briseida Chaudoin is a 41 year old female with a past medical history of insulin-dependent uncontrolled type 2 diabetes with last hemoglobin A1c of 13.5, osteomyelitis of the right foot status post transmetatarsal amputation on 12/18/2020 that presents to the clinic for right foot wound. She has had dehiscence  of the surgical site. She is currently using wet-to-dry dressings. She has a PICC line and receiving IV ceftriaxone daily for her osteomyelitis. There is an end date of 01/27/2021. She is also taking oral metronidazole. She currently denies systemic signs of infection. 1/19; patient presents for follow-up. She was diagnosed with a DVT to the right lower extremity 2 days ago. She is on Eliquis now. She is scheduled to see her infectious disease doctor tomorrow. She has been using Dakin's wet-to-dry dressings. She denies systemic signs of infection. 1/26; patient presents for follow-up. She saw infectious disease on 1/21 started on Augmentin. Her PICC line and IV ceftriaxone was discontinued. Patient reports stability to her wound. She has been using Dakin's wet-to-dry dressings. She currently denies systemic signs of infection. 2/3; patient presents for follow-up. She continues to use Dakin's wet-to-dry dressings to the wound bed. She saw Dr. Yvette Rack with infectious disease yesterday and is continuing Augmentin. T entative end date is 2/16. Patient reports following up with orthopedics. She states there is no further plan from them. She currently denies systemic signs of infection. 2/10; patient presents for follow-up. She continues to use Dakin's wet to dry dressings. She is scheduled to have her MRI done on 2/14. She states that she had pain to the debridement site from last clinic visit and declines debridement today. She denies systemic signs of infection. She continues to have yellow thick drainage. 2/20; patient presents for follow-up. She continues to use Dakin's wet-to-dry dressings. She obtained her MRI. The results showed an abscess and she is scheduled to see her orthopedic surgeon on 2/23. She saw infectious disease 2/17 and her antibiotics were extended. She currently denies systemic signs of infection. 3/6; patient presents for follow-up. She had debridement and irrigation of her foot on  03/10/2021 due to abscess noted on MRI. She was started on IV ceftriaxone and oral Flagyl. She has no issues or complaints today. She has been using iodoform packing to the tunnel and Dakin's wet-to-dry to the opening. 03/26/2021: She continues on IV ceftriaxone and oral metronidazole. She has follow-up with infectious disease tomorrow. No significant issues or complaints today. Her mother continues to help her with her wound dressing, using iodoform packing strips into the tunnel and Dakin's to the open portion of the wound. 3/20; patient presents for follow-up. She continues to be on IV ceftriaxone in oral metronidazole. She has been using iodoform to the tunnel and Dakin's wet-to- dry to the open wound. She denies signs of infection. 3/27; patient presents for follow-up. She no longer has a PICC line. She has been using iodoform to the tunnel and Dakin's wet-to-dry to the open wound. She reports improvement in wound healing. She denies signs of infection. 4/3; patient presents for follow-up. She states she has been using Hydrofera Blue to the open wound and iodoform packing to the tunnel without any issues. She denies signs of infection. 4/18; patient presents for follow-up. She saw infectious disease on 4/11. She has finished her oral antibiotics and completed a  total of 6 weeks of antibiotics (this includes IV as well). No further antibiotics needed. She has been using Hydrofera Blue and iodoform packing. She states that the tunneled area has come in and the iodoform is not staying in place anymore. She has no issues or complaints today. She denies signs of infection. 4/24; patient presents for follow-up. She saw Dr. Despina Pole, plastic surgery to discuss potential skin graft/substitute placement. At this time he thinks that the skin graft would likely not take. He is in agreement with trying a wound VAC. Patient has been using Hydrofera Blue dressing changes with no issues. She denies signs of  infection. She reports improvement in wound healing. 5/1; patient presents for follow-up. Unfortunately patient did not have insurance when we ran for the pico. There is an assistance program and we are trying to get this accommodated for the patient. In the meantime she has been using Hydrofera Blue without any issues. She denies signs of infection. 5/8; patient presents for follow-up. We have not heard back if pico is covered by her insurance. She has been using collagen to the wound bed over the past week. She denies signs of infection. 5/18; patient presents for follow-up. She has been using collagen to the wound bed without issues. Again we have not heard if pico is covered by her insurance. She has no issues or complaints today. 5/23; patient presents for follow-up. She has been using collagen to the wound bed. She has no issues or complaints today. She obtained the wound VAC from [facility] and brought this in today. She denies signs of infection. 6/1; patient presents for follow-up. She has been using the wound VAC for the past week. She has had this changed twice since she was last here. She reports more maceration to the periwound. She denies signs of infection. 6/7; right TMA site. There are 2 wounds 0 separated by a bridge of healed tissue. The more lateral area has undermining. Both areas have healthy looking granulation at the base but relative the size of the wound is fairly deep. There is no exposed bone no evidence of infection. Her wound VAC was put on hold last week because of surrounding skin maceration she has been using collagen this week. She has a modified shoe 6/12; patient presents for follow-up. Last week the wound VAC was reinitiated. She had no issues with the wound VAC itself. Today she has maceration again noted to the surrounding skin. She denies signs of infection. 6/27; patient presents for follow-up. She has been using Medihoney to the wound bed. We took a break from the  wound VAC because the periwound was macerated. She still has some areas of maceration to the distal foot where there is a callus. She currently denies signs of infection. 7/11; patient presents for follow-up. She has been using Medihoney to the wound bed. She has developed some increased warmth and erythema to the lateral aspect of the right foot. She states this is occurred over the past week and there is increased pain. No drainage noted. 7/17; patient presents for follow-up. She has been using Medihoney to the wound bed. She completed her course of Bactrim. She reports improvement in symptoms. 7/24; Patient presents for follow up. She has been using Medihoney to the wound bed without issues. She completed another course of Bactrim. She reports improvement in her symptoms but still has some mild tenderness to the medial aspect of the foot. Patient History Information obtained from Patient. Family History Cancer - Paternal  Grandparents, Diabetes - Mother, Hypertension - Mother, Stroke - Maternal Grandparents, No family history of Heart Disease, Hereditary Spherocytosis, Kidney Disease, Lung Disease, Seizures, Thyroid Problems, Tuberculosis. Social History Never smoker, Marital Status - Single, Alcohol Use - Rarely, Drug Use - Prior History - Marijuana, Caffeine Use - Daily. Medical History Cardiovascular Patient has history of Hypertension Endocrine Patient has history of Type II Diabetes Musculoskeletal Patient has history of Osteomyelitis - Right Transmet 12/18/20 Neurologic Patient has history of Neuropathy Objective Constitutional respirations regular, non-labored and within target range for patient.. Vitals Time Taken: 2:53 PM, Height: 69 in, Temperature: 98.4 F, Pulse: 85 bpm, Respiratory Rate: 18 breaths/min, Blood Pressure: 99/66 mmHg, Capillary Blood Glucose: 168 mg/dl. Cardiovascular 2+ dorsalis pedis/posterior tibialis pulses. Psychiatric pleasant and  cooperative. General Notes: Right foot: T the transmetatarsal amputation site there is wound dehiscence. She has 3 wounds 1 to the Lateral aspect the other to the medial o aspect and 1 in the center of the amputation site. There is nonviable tissue in the wound beds. Postdebridement there is granulation tissue. The medial wound that tunnels. Integumentary (Hair, Skin) Wound #1 status is Open. Original cause of wound was Surgical Injury. The date acquired was: 12/15/2020. The wound has been in treatment 28 weeks. The wound is located on the Right Amputation Site - Transmetatarsal. The wound measures 0.3cm length x 0.6cm width x 0.3cm depth; 0.141cm^2 area and 0.042cm^3 volume. There is Fat Layer (Subcutaneous Tissue) exposed. There is no tunneling or undermining noted. There is a medium amount of serosanguineous drainage noted. The wound margin is thickened. There is small (1-33%) pink, pale granulation within the wound bed. There is a large (67-100%) amount of necrotic tissue within the wound bed including Adherent Slough. Wound #2 status is Open. Original cause of wound was Surgical Injury. The date acquired was: 07/10/2021. The wound has been in treatment 3 weeks. The wound is located on the Right,Medial Amputation Site - Transmetatarsal. The wound measures 0.2cm length x 0.5cm width x 1cm depth; 0.079cm^2 area and 0.079cm^3 volume. There is Fat Layer (Subcutaneous Tissue) exposed. There is no tunneling or undermining noted. There is a medium amount of serosanguineous drainage noted. The wound margin is distinct with the outline attached to the wound base. There is large (67-100%) red, pink granulation within the wound bed. There is a small (1-33%) amount of necrotic tissue within the wound bed. Wound #3 status is Open. Original cause of wound was Other Lesion. The date acquired was: 08/06/2021. The wound is located on the Right,Anterior Amputation Site - Transmetatarsal. The wound measures 0.2cm  length x 0.3cm width x 0.4cm depth; 0.047cm^2 area and 0.019cm^3 volume. There is Fat Layer (Subcutaneous Tissue) exposed. There is no tunneling or undermining noted. There is a medium amount of serosanguineous drainage noted. The wound margin is distinct with the outline attached to the wound base. There is large (67-100%) red granulation within the wound bed. There is no necrotic tissue within the wound bed. Assessment Active Problems ICD-10 Non-pressure chronic ulcer of other part of right foot with necrosis of bone Type 2 diabetes mellitus with foot ulcer Osteomyelitis, unspecified Patient's wound is stable. I debrided nonviable tissue. I recommended Medihoney to the wound beds except for the medial wound. I recommended packing this with Dakin's wet-to-dry dressing. No signs of infection on exam. Will not extend antibiotics at this time. She is completed 2 weeks of Bactrim.. I recommended packing the tunneled area with Dakin's wet-to-dry packing strips. Follow-up in 1 week. She starts  physical therapy early next month. Procedures Wound #1 Pre-procedure diagnosis of Wound #1 is a Diabetic Wound/Ulcer of the Lower Extremity located on the Right Amputation Site - Transmetatarsal .Severity of Tissue Pre Debridement is: Fat layer exposed. There was a Excisional Skin/Subcutaneous Tissue Debridement with a total area of 0.18 sq cm performed by Geralyn Corwin, DO. With the following instrument(s): Curette to remove Non-Viable tissue/material. Material removed includes Subcutaneous Tissue and Slough and after achieving pain control using Lidocaine 4% Topical Solution. No specimens were taken. A time out was conducted at 15:18, prior to the start of the procedure. A Minimum amount of bleeding was controlled with Pressure. The procedure was tolerated well. Post Debridement Measurements: 0.3cm length x 0.6cm width x 0.3cm depth; 0.042cm^3 volume. Character of Wound/Ulcer Post Debridement is stable.  Severity of Tissue Post Debridement is: Fat layer exposed. Post procedure Diagnosis Wound #1: Same as Pre-Procedure Wound #2 Pre-procedure diagnosis of Wound #2 is a Dehisced Wound located on the Right,Medial Amputation Site - Transmetatarsal . There was a Excisional Skin/Subcutaneous Tissue Debridement with a total area of 0.1 sq cm performed by Geralyn Corwin, DO. With the following instrument(s): Curette to remove Non-Viable tissue/material. Material removed includes Subcutaneous Tissue and Slough and after achieving pain control using Lidocaine 4% T opical Solution. No specimens were taken. A time out was conducted at 15:18, prior to the start of the procedure. A Minimum amount of bleeding was controlled with Pressure. The procedure was tolerated well. Post Debridement Measurements: 0.2cm length x 0.5cm width x 1cm depth; 0.079cm^3 volume. Character of Wound/Ulcer Post Debridement is stable. Post procedure Diagnosis Wound #2: Same as Pre-Procedure Wound #3 Pre-procedure diagnosis of Wound #3 is a Diabetic Wound/Ulcer of the Lower Extremity located on the Right,Anterior Amputation Site - Transmetatarsal .Severity of Tissue Pre Debridement is: Fat layer exposed. There was a Excisional Skin/Subcutaneous Tissue Debridement with a total area of 0.06 sq cm performed by Geralyn Corwin, DO. With the following instrument(s): Curette to remove Non-Viable tissue/material. Material removed includes Subcutaneous Tissue and Slough and after achieving pain control using Lidocaine 4% Topical Solution. No specimens were taken. A time out was conducted at 15:18, prior to the start of the procedure. A Minimum amount of bleeding was controlled with Pressure. The procedure was tolerated well. Post Debridement Measurements: 0.2cm length x 0.3cm width x 0.4cm depth; 0.019cm^3 volume. Character of Wound/Ulcer Post Debridement is stable. Severity of Tissue Post Debridement is: Fat layer exposed. Post procedure  Diagnosis Wound #3: Same as Pre-Procedure Plan Follow-up Appointments: Return Appointment in 1 week. - Dr. Mikey Bussing and Lennox Laity, RN Room 7 08/13/2021 2:45pm Bathing/ Shower/ Hygiene: Other Bathing/Shower/Hygiene Orders/Instructions: - Clean with Saline or Dakins Negative Presssure Wound Therapy: Discontinue wound vac - Call [employer] company and return wound vac. Edema Control - Lymphedema / SCD / Other: Elevate legs to the level of the heart or above for 30 minutes daily and/or when sitting, a frequency of: - throughout the day Avoid standing for long periods of time. Moisturize legs daily. Off-Loading: Open toe surgical shoe to: - May use surgical shoe Additional Orders / Instructions: Follow Nutritious Diet - -Monitor/Control Blood Sugar -High Protein Diet WOUND #1: - Amputation Site - Transmetatarsal Wound Laterality: Right Cleanser: Normal Saline (Generic) 1 x Per Day/30 Days Discharge Instructions: Cleanse the wound with Normal Saline prior to applying a clean dressing using gauze sponges, not tissue or cotton balls. Prim Dressing: MediHoney Gel, tube 1.5 (oz) 1 x Per Day/30 Days ary Discharge Instructions: Apply to wound  bed as instructed Secondary Dressing: ABD Pad, 5x9 1 x Per Day/30 Days Discharge Instructions: Apply over primary dressing as directed. Secondary Dressing: Woven Gauze Sponge, Non-Sterile 4x4 in 1 x Per Day/30 Days Discharge Instructions: Apply over primary dressing as directed. Secured With: Elastic Bandage 4 inch (ACE bandage) (Generic) 1 x Per Day/30 Days Discharge Instructions: Secure with ACE bandage as directed. Secured With: [employer], 4.5x3.1 (in/yd) (Generic) 1 x Per Day/30 Days Discharge Instructions: Secure with Kerlix as directed. WOUND #2: - Amputation Site - Transmetatarsal Wound Laterality: Right, Medial Cleanser: Normal Saline (Generic) 1 x Per Day/30 Days Discharge Instructions: Cleanse the wound with Normal Saline prior to applying a clean  dressing using gauze sponges, not tissue or cotton balls. Prim Dressing: Plain packing strip 1/4 (in) 1 x Per Day/30 Days ary Discharge Instructions: Lightly pack as instructed Prim Dressing: Dakin's Solution 0.25%, 16 (oz) 1 x Per Day/30 Days ary Discharge Instructions: Moisten packing strip with Dakin's solution Secondary Dressing: ABD Pad, 5x9 1 x Per Day/30 Days Discharge Instructions: Apply over primary dressing as directed. Secondary Dressing: Woven Gauze Sponge, Non-Sterile 4x4 in 1 x Per Day/30 Days Discharge Instructions: Apply over primary dressing as directed. Secured With: Elastic Bandage 4 inch (ACE bandage) (Generic) 1 x Per Day/30 Days Discharge Instructions: Secure with ACE bandage as directed. Secured With: [employer], 4.5x3.1 (in/yd) (Generic) 1 x Per Day/30 Days Discharge Instructions: Secure with Kerlix as directed. WOUND #3: - Amputation Site - Transmetatarsal Wound Laterality: Right, Anterior Cleanser: Normal Saline (Generic) 1 x Per Day/30 Days Discharge Instructions: Cleanse the wound with Normal Saline prior to applying a clean dressing using gauze sponges, not tissue or cotton balls. Prim Dressing: MediHoney Gel, tube 1.5 (oz) 1 x Per Day/30 Days ary Discharge Instructions: Apply to wound bed as instructed Secondary Dressing: ABD Pad, 5x9 1 x Per Day/30 Days Discharge Instructions: Apply over primary dressing as directed. Secondary Dressing: Woven Gauze Sponge, Non-Sterile 4x4 in 1 x Per Day/30 Days Discharge Instructions: Apply over primary dressing as directed. Secured With: Elastic Bandage 4 inch (ACE bandage) (Generic) 1 x Per Day/30 Days Discharge Instructions: Secure with ACE bandage as directed. Secured With: [employer], 4.5x3.1 (in/yd) (Generic) 1 x Per Day/30 Days Discharge Instructions: Secure with Kerlix as directed. 1. Medihoney 2. Dakin's 3. Follow-up in 1 week 4. Aggressive offloadingoosurgical shoe Electronic  Signature(s) Signed: 08/06/2021 4:09:14 PM By: Kalman Shan DO Entered By: Kalman Shan on 08/06/2021 15:38:07 -------------------------------------------------------------------------------- HxROS Details Patient Name: Date of Service: [name], [name] 08/06/2021 3:00 PM Medical Record Number: [id number] Patient Account Number: [id number] Date of Birth/Sex: Treating RN: 27-Jul-1980 (41 y.o. [name] Primary Care Provider: Juluis Mire Other Clinician: Referring Provider: Treating Provider/Extender: Edmonia Lynch in Treatment: 28 Information Obtained From Patient Cardiovascular Medical History: Positive for: Hypertension Endocrine Medical History: Positive for: Type II Diabetes Time with diabetes: Dx 2009 Treated with: Insulin, Oral agents Blood sugar tested every day: Yes Tested : daily Musculoskeletal Medical History: Positive for: Osteomyelitis - Right Transmet 12/18/20 Neurologic Medical History: Positive for: Neuropathy Immunizations Pneumococcal Vaccine: Received Pneumococcal Vaccination: No Implantable Devices Yes Family and Social History Cancer: Yes - Paternal Grandparents; Diabetes: Yes - Mother; Heart Disease: No; Hereditary Spherocytosis: No; Hypertension: Yes - Mother; Kidney Disease: No; Lung Disease: No; Seizures: No; Stroke: Yes - Maternal Grandparents; Thyroid Problems: No; Tuberculosis: No; Never smoker; Marital Status - Single; Alcohol Use: Rarely; Drug Use: Prior History - Marijuana; Caffeine Use: Daily;  Financial Concerns: No; Food, Clothing or Shelter Needs: No; Support System Lacking: No; Transportation Concerns: No Electronic Signature(s) Signed: 08/06/2021 4:09:14 PM By: Kalman Shan DO Signed: 08/06/2021 4:56:39 PM By: [name] RN, BSN Entered By: Kalman Shan on 08/06/2021 15:34:43 -------------------------------------------------------------------------------- [place]  Details Patient Name: Date of Service: [name], [name] 08/06/2021 Medical Record Number: [id number] Patient Account Number: [id number] Date of Birth/Sex: Treating RN: Nov 19, 1980 (41 y.o. [name] Primary Care Provider: Juluis Mire Other Clinician: Referring Provider: Treating Provider/Extender: Edmonia Lynch in Treatment: 28 Diagnosis Coding ICD-10 Codes Code Description [phone] Non-pressure chronic ulcer of other part of right foot with necrosis of bone E11.621 Type 2 diabetes mellitus with foot ulcer M86.9 Osteomyelitis, unspecified Facility Procedures CPT4 Code: [id number] Description: [id number] - DEB SUBQ TISSUE 20 SQ CM/< ICD-10 Diagnosis Description L97.514 Non-pressure chronic ulcer of other part of right foot with necrosis of bone Modifier: Quantity: 1 Physician Procedures : CPT4 Code Description Modifier [id number] - WC PHYS SUBQ TISS 20 SQ CM ICD-10 Diagnosis Description L97.514 Non-pressure chronic ulcer of other part of right foot with necrosis of bone Quantity: 1 Electronic Signature(s) Signed: 08/06/2021 4:09:14 PM By: Kalman Shan DO Entered By: Kalman Shan on 08/06/2021 15:38:24

## 2021-08-10 ENCOUNTER — Other Ambulatory Visit: Payer: Self-pay

## 2021-08-13 ENCOUNTER — Encounter (HOSPITAL_BASED_OUTPATIENT_CLINIC_OR_DEPARTMENT_OTHER): Payer: Medicaid Other | Admitting: Internal Medicine

## 2021-08-13 DIAGNOSIS — E11621 Type 2 diabetes mellitus with foot ulcer: Secondary | ICD-10-CM

## 2021-08-13 DIAGNOSIS — M869 Osteomyelitis, unspecified: Secondary | ICD-10-CM | POA: Diagnosis not present

## 2021-08-13 DIAGNOSIS — L97514 Non-pressure chronic ulcer of other part of right foot with necrosis of bone: Secondary | ICD-10-CM

## 2021-08-13 NOTE — Progress Notes (Signed)
[name], [name] ([id number]) Visit Report for 08/13/2021 Multi-Disciplinary Care Plan Details Patient Name: Date of Service: [name] IS, [name] [name] 08/13/2021 2:45 PM Medical Record Number: [id number] Patient Account Number: [id number] Date of Birth/Sex: Treating RN: Dec 25, 1980 (41 y.o. [name] Primary Care Dametra Whetsel: Juluis Mire Other Clinician: Referring Jourdin Gens: Treating Bresha Hosack/Extender: Edmonia Lynch in Treatment: 29 Active Inactive Nutrition Nursing Diagnoses: Impaired glucose control: actual or potential Goals: Patient/caregiver verbalizes understanding of need to maintain therapeutic glucose control per primary care physician Date Initiated: 01/22/2021 Target Resolution Date: 09/10/2021 Goal Status: Active Interventions: Assess HgA1c results as ordered upon admission and as needed Provide education on elevated blood sugars and impact on wound healing Treatment Activities: Obtain HgA1c : 01/22/2021 Notes: 07/10/21: Glucose control ongoing. Wound/Skin Impairment Nursing Diagnoses: Impaired tissue integrity Goals: Patient/caregiver will verbalize understanding of skin care regimen Date Initiated: 01/22/2021 Target Resolution Date: 09/10/2021 Goal Status: Active Ulcer/skin breakdown will have a volume reduction of 30% by week 4 Date Initiated: 01/22/2021 Date Inactivated: 03/19/2021 Target Resolution Date: 03/16/2021 Goal Status: Met Ulcer/skin breakdown will have a volume reduction of 50% by week 8 Date Initiated: 03/19/2021 Date Inactivated: 05/14/2021 Target Resolution Date: 04/16/2021 Unmet Reason: see wound Goal Status: Unmet measurements Interventions: Assess patient/caregiver ability to obtain necessary supplies Assess patient/caregiver ability to perform ulcer/skin care regimen upon admission and as needed Assess ulceration(s) every visit Provide education on ulcer and skin care Treatment Activities: Topical wound management initiated  : 01/22/2021 Notes: 06/04/21: Wound vac started 07/10/21: Wound care regimen continues Electronic Signature(s) Signed: 08/13/2021 3:09:43 PM By: Lorrin Jackson Entered By: Lorrin Jackson on 08/13/2021 15:09:43 -------------------------------------------------------------------------------- Patient/Caregiver Education Details Patient Name: Date of Service: DA [name] Hammans, [name] 7/31/2023andnbsp2:45 PM Medical Record Number: [id number] Patient Account Number: [id number] Date of Birth/Gender: Treating RN: [birth year] (41 y.o. [name] Primary Care Physician: Juluis Mire Other Clinician: Referring Physician: Treating Physician/Extender: Edmonia Lynch in Treatment: 29 Education Assessment Education Provided To: Patient Education Topics Provided Elevated Blood Sugar/ Impact on Healing: Methods: Explain/Verbal, Printed Responses: State content correctly Offloading: Methods: Explain/Verbal, Printed Responses: State content correctly Wound/Skin Impairment: Methods: Explain/Verbal, Printed Responses: State content correctly Electronic Signature(s) Unsigned Entered By: Lorrin Jackson on 08/13/2021 15:10:06 Signature(s): Date(s):

## 2021-08-14 NOTE — Progress Notes (Signed)
Donoso, Talley L. ([id number]) Visit Report for 08/13/2021 Chief Complaint Document Details Patient Name: Date of Service: [name] IS, [place] [name] 08/13/2021 2:45 PM Medical Record Number: [id number] Patient Account Number: [id number] Date of Birth/Sex: Treating RN: 12-24-1980 (41 y.o. [name] Primary Care Provider: Gwinda Passe Other Clinician: Referring Provider: Treating Provider/Extender: Grace Isaac in Treatment: 29 Information Obtained from: Patient Chief Complaint Osteomyelitis of the right foot status post transmetatarsal amputation with surgical site dehiscence Electronic Signature(s) Signed: 08/14/2021 8:48:16 AM By: Geralyn Corwin DO Entered By: Geralyn Corwin on 08/13/2021 15:46:14 -------------------------------------------------------------------------------- HPI Details Patient Name: Date of Service: [name], [name] 08/13/2021 2:45 PM Medical Record Number: [id number] Patient Account Number: [id number] Date of Birth/Sex: Treating RN: October 19, 1980 (41 y.o. [name] Primary Care Provider: Gwinda Passe Other Clinician: Referring Provider: Treating Provider/Extender: Grace Isaac in Treatment: 29 History of Present Illness HPI Description: Admission 01/22/2021 [name] is a 41 year old female with a past medical history of insulin-dependent uncontrolled type 2 diabetes with last hemoglobin A1c of 13.5, osteomyelitis of the right foot status post transmetatarsal amputation on 12/18/2020 that presents to the clinic for right foot wound. She has had dehiscence of the surgical site. She is currently using wet-to-dry dressings. She has a PICC line and receiving IV ceftriaxone daily for her osteomyelitis. There is an end date of 01/27/2021. She is also taking oral metronidazole. She currently denies systemic signs of infection. 1/19; patient presents for follow-up. She was diagnosed with a DVT  to the right lower extremity 2 days ago. She is on Eliquis now. She is scheduled to see her infectious disease doctor tomorrow. She has been using Dakin's wet-to-dry dressings. She denies systemic signs of infection. 1/26; patient presents for follow-up. She saw infectious disease on 1/21 started on Augmentin. Her PICC line and IV ceftriaxone was discontinued. Patient reports stability to her wound. She has been using Dakin's wet-to-dry dressings. She currently denies systemic signs of infection. 2/3; patient presents for follow-up. She continues to use Dakin's wet-to-dry dressings to the wound bed. She saw Dr. Manson Passey with infectious disease yesterday and is continuing Augmentin. T entative end date is 2/16. Patient reports following up with orthopedics. She states there is no further plan from them. She currently denies systemic signs of infection. 2/10; patient presents for follow-up. She continues to use Dakin's wet to dry dressings. She is scheduled to have her MRI done on 2/14. She states that she had pain to the debridement site from last clinic visit and declines debridement today. She denies systemic signs of infection. She continues to have yellow thick drainage. 2/20; patient presents for follow-up. She continues to use Dakin's wet-to-dry dressings. She obtained her MRI. The results showed an abscess and she is scheduled to see her orthopedic surgeon on 2/23. She saw infectious disease 2/17 and her antibiotics were extended. She currently denies systemic signs of infection. 3/6; patient presents for follow-up. She had debridement and irrigation of her foot on 03/10/2021 due to abscess noted on MRI. She was started on IV ceftriaxone and oral Flagyl. She has no issues or complaints today. She has been using iodoform packing to the tunnel and Dakin's wet-to-dry to the opening. 03/26/2021: She continues on IV ceftriaxone and oral metronidazole. She has follow-up with infectious disease tomorrow.  No significant issues or complaints today. Her mother continues to help her with her wound dressing, using iodoform packing strips into the tunnel and Dakin's to the  open portion of the wound. 3/20; patient presents for follow-up. She continues to be on IV ceftriaxone in oral metronidazole. She has been using iodoform to the tunnel and Dakin's wet-to- dry to the open wound. She denies signs of infection. 3/27; patient presents for follow-up. She no longer has a PICC line. She has been using iodoform to the tunnel and Dakin's wet-to-dry to the open wound. She reports improvement in wound healing. She denies signs of infection. 4/3; patient presents for follow-up. She states she has been using Hydrofera Blue to the open wound and iodoform packing to the tunnel without any issues. She denies signs of infection. 4/18; patient presents for follow-up. She saw infectious disease on 4/11. She has finished her oral antibiotics and completed a total of 6 weeks of antibiotics (this includes IV as well). No further antibiotics needed. She has been using Hydrofera Blue and iodoform packing. She states that the tunneled area has come in and the iodoform is not staying in place anymore. She has no issues or complaints today. She denies signs of infection. 4/24; patient presents for follow-up. She saw Dr. Carlene CoriaLubin's, plastic surgery to discuss potential skin graft/substitute placement. At this time he thinks that the skin graft would likely not take. He is in agreement with trying a wound VAC. Patient has been using Hydrofera Blue dressing changes with no issues. She denies signs of infection. She reports improvement in wound healing. 5/1; patient presents for follow-up. Unfortunately patient did not have insurance when we ran for the pico. There is an assistance program and we are trying to get this accommodated for the patient. In the meantime she has been using Hydrofera Blue without any issues. She denies signs of  infection. 5/8; patient presents for follow-up. We have not heard back if pico is covered by her insurance. She has been using collagen to the wound bed over the past week. She denies signs of infection. 5/18; patient presents for follow-up. She has been using collagen to the wound bed without issues. Again we have not heard if pico is covered by her insurance. She has no issues or complaints today. 5/23; patient presents for follow-up. She has been using collagen to the wound bed. She has no issues or complaints today. [facility]. She denies signs of infection. 6/1; patient presents for follow-up. She has been using the wound VAC for the past week. She has had this changed twice since she was last here. She reports more maceration to the periwound. She denies signs of infection. 6/7; right TMA site. There are 2 wounds 0 separated by a bridge of healed tissue. The more lateral area has undermining. Both areas have healthy looking granulation at the base but relative the size of the wound is fairly deep. There is no exposed bone no evidence of infection. Her wound VAC was put on hold last week because of surrounding skin maceration she has been using collagen this week. She has a modified shoe 6/12; patient presents for follow-up. Last week the wound VAC was reinitiated. She had no issues with the wound VAC itself. Today she has maceration again noted to the surrounding skin. She denies signs of infection. 6/27; patient presents for follow-up. She has been using Medihoney to the wound bed. We took a break from the wound VAC because the periwound was macerated. She still has some areas of maceration to the distal foot where there is a callus. She currently  denies signs of infection. 7/11; patient presents for follow-up. She has been using Medihoney to the wound bed. She has developed some increased warmth and erythema to the lateral aspect of the  right foot. She states this is occurred over the past week and there is increased pain. No drainage noted. 7/17; patient presents for follow-up. She has been using Medihoney to the wound bed. She completed her course of Bactrim. She reports improvement in symptoms. 7/24; Patient presents for follow up. She has been using Medihoney to the wound bed without issues. She completed another course of Bactrim. She reports improvement in her symptoms but still has some mild tenderness to the medial aspect of the foot. 7/31; Patient presents for follow-up. She has been using Medihoney and Dakin's to the wound bed. She denies signs of infection. Electronic Signature(s) Signed: 08/14/2021 8:48:16 AM By: Geralyn Corwin DO Entered By: Geralyn Corwin on 08/13/2021 15:48:06 -------------------------------------------------------------------------------- Physical Exam Details Patient Name: Date of Service: [name], [name] 08/13/2021 2:45 PM Medical Record Number: [id number] Patient Account Number: [id number] Date of Birth/Sex: Treating RN: 07-Dec-1980 (41 y.o. [name] Primary Care Provider: Gwinda Passe Other Clinician: Referring Provider: Treating Provider/Extender: Grace Isaac in Treatment: 29 Constitutional respirations regular, non-labored and within target range for patient.. Cardiovascular 2+ dorsalis pedis/posterior tibialis pulses. Psychiatric pleasant and cooperative. Notes Right foot: T the transmetatarsal amputation site there is wound dehiscence. She has 2 wounds present. Middle portion of the wound has healed. The lateral o aspect has a small open wound with granulation tissue. The medial aspect has a tunnel. No signs of surrounding infection. Electronic Signature(s) Signed: 08/14/2021 8:48:16 AM By: Geralyn Corwin DO Entered By: Geralyn Corwin on 08/13/2021  15:56:09 -------------------------------------------------------------------------------- Physician Orders Details Patient Name: Date of Service: [name], [name] 08/13/2021 2:45 PM Medical Record Number: [id number] Patient Account Number: [id number] Date of Birth/Sex: Treating RN: 03-22-1980 (41 y.o. [name] Primary Care Provider: Gwinda Passe Other Clinician: Referring Provider: Treating Provider/Extender: Grace Isaac in Treatment: 29 Verbal / Phone Orders: No Diagnosis Coding ICD-10 Coding Code Description L97.514 Non-pressure chronic ulcer of other part of right foot with necrosis of bone E11.621 Type 2 diabetes mellitus with foot ulcer M86.9 Osteomyelitis, unspecified Follow-up Appointments ppointment in 1 week. - Dr. Mikey Bussing and Lennox Laity, RN Room 7 08/20/2021 11:00am Return A Bathing/ Shower/ Hygiene Other Bathing/Shower/Hygiene Orders/Instructions: - Clean with Saline or Dakins Negative Presssure Wound Therapy Discontinue wound vac - Call [employer] company and return wound vac. Edema Control - Lymphedema / SCD / Other Elevate legs to the level of the heart or above for 30 minutes daily and/or when sitting, a frequency of: - throughout the day Avoid standing for long periods of time. Moisturize legs daily. Off-Loading Open toe surgical shoe to: - May use surgical shoe Additional Orders / Instructions Follow Nutritious Diet - -Monitor/Control Blood Sugar -High Protein Diet Wound Treatment Wound #2 - Amputation Site - Transmetatarsal Wound Laterality: Right, Medial Cleanser: Normal Saline (Generic) 1 x Per Day/30 Days Discharge Instructions: Cleanse the wound with Normal Saline prior to applying a clean dressing using gauze sponges, not tissue or cotton balls. Prim Dressing: Plain packing strip 1/4 (in) 1 x Per Day/30 Days ary Discharge Instructions: Lightly pack as instructed Prim Dressing: Dakin's Solution 0.25%, 16 (oz) 1 x Per  Day/30 Days ary Discharge Instructions: Moisten packing strip with Dakin's solution Secondary Dressing: ABD Pad, 5x9 1 x Per Day/30 Days Discharge Instructions: Apply  over primary dressing as directed. Secondary Dressing: Woven Gauze Sponge, Non-Sterile 4x4 in 1 x Per Day/30 Days Discharge Instructions: Apply over primary dressing as directed. Secured With: Elastic Bandage 4 inch (ACE bandage) (Generic) 1 x Per Day/30 Days Discharge Instructions: Secure with ACE bandage as directed. Secured With: [employer], 4.5x3.1 (in/yd) (Generic) 1 x Per Day/30 Days Discharge Instructions: Secure with Kerlix as directed. Wound #3 - Amputation Site - Transmetatarsal Wound Laterality: Right, Anterior Cleanser: Normal Saline (Generic) 1 x Per Day/30 Days Discharge Instructions: Cleanse the wound with Normal Saline prior to applying a clean dressing using gauze sponges, not tissue or cotton balls. Prim Dressing: MediHoney Gel, tube 1.5 (oz) 1 x Per Day/30 Days ary Discharge Instructions: Apply to wound bed as instructed Secondary Dressing: ABD Pad, 5x9 1 x Per Day/30 Days Discharge Instructions: Apply over primary dressing as directed. Secondary Dressing: Woven Gauze Sponge, Non-Sterile 4x4 in 1 x Per Day/30 Days Discharge Instructions: Apply over primary dressing as directed. Secured With: Elastic Bandage 4 inch (ACE bandage) (Generic) 1 x Per Day/30 Days Discharge Instructions: Secure with ACE bandage as directed. Secured With: [employer], 4.5x3.1 (in/yd) (Generic) 1 x Per Day/30 Days Discharge Instructions: Secure with Kerlix as directed. Electronic Signature(s) Signed: 08/14/2021 8:48:16 AM By: Geralyn Corwin DO Entered By: Geralyn Corwin on 08/13/2021 15:56:18 -------------------------------------------------------------------------------- Problem List Details Patient Name: Date of Service: [name], [name] 08/13/2021 2:45 PM Medical Record Number: [id number] Patient  Account Number: [id number] Date of Birth/Sex: Treating RN: 09/17/1980 (41 y.o. [name] Primary Care Provider: Gwinda Passe Other Clinician: Referring Provider: Treating Provider/Extender: Grace Isaac in Treatment: 29 Active Problems ICD-10 Encounter Code Description Active Date MDM Diagnosis L97.514 Non-pressure chronic ulcer of other part of right foot with necrosis of bone 01/22/2021 No Yes E11.621 Type 2 diabetes mellitus with foot ulcer 01/22/2021 No Yes M86.9 Osteomyelitis, unspecified 01/22/2021 No Yes Inactive Problems Resolved Problems Electronic Signature(s) Signed: 08/14/2021 8:48:16 AM By: Geralyn Corwin DO Previous Signature: 08/13/2021 3:09:28 PM Version By: Antonieta Iba Entered By: Geralyn Corwin on 08/13/2021 15:44:52 -------------------------------------------------------------------------------- Progress Note Details Patient Name: Date of Service: [name], [name] 08/13/2021 2:45 PM Medical Record Number: [id number] Patient Account Number: [id number] Date of Birth/Sex: Treating RN: 1980/04/30 (41 y.o. [name] Primary Care Provider: Gwinda Passe Other Clinician: Referring Provider: Treating Provider/Extender: Grace Isaac in Treatment: 29 Subjective Chief Complaint Information obtained from Patient Osteomyelitis of the right foot status post transmetatarsal amputation with surgical site dehiscence History of Present Illness (HPI) Admission 01/22/2021 [name] is a 41 year old female with a past medical history of insulin-dependent uncontrolled type 2 diabetes with last hemoglobin A1c of 13.5, osteomyelitis of the right foot status post transmetatarsal amputation on 12/18/2020 that presents to the clinic for right foot wound. She has had dehiscence of the surgical site. She is currently using wet-to-dry dressings. She has a PICC line and receiving IV ceftriaxone daily for  her osteomyelitis. There is an end date of 01/27/2021. She is also taking oral metronidazole. She currently denies systemic signs of infection. 1/19; patient presents for follow-up. She was diagnosed with a DVT to the right lower extremity 2 days ago. She is on Eliquis now. She is scheduled to see her infectious disease doctor tomorrow. She has been using Dakin's wet-to-dry dressings. She denies systemic signs of infection. 1/26; patient presents for follow-up. She saw infectious disease on 1/21 started on Augmentin. Her PICC line and  IV ceftriaxone was discontinued. Patient reports stability to her wound. She has been using Dakin's wet-to-dry dressings. She currently denies systemic signs of infection. 2/3; patient presents for follow-up. She continues to use Dakin's wet-to-dry dressings to the wound bed. She saw Dr. Manson Passey with infectious disease yesterday and is continuing Augmentin. T entative end date is 2/16. Patient reports following up with orthopedics. She states there is no further plan from them. She currently denies systemic signs of infection. 2/10; patient presents for follow-up. She continues to use Dakin's wet to dry dressings. She is scheduled to have her MRI done on 2/14. She states that she had pain to the debridement site from last clinic visit and declines debridement today. She denies systemic signs of infection. She continues to have yellow thick drainage. 2/20; patient presents for follow-up. She continues to use Dakin's wet-to-dry dressings. She obtained her MRI. The results showed an abscess and she is scheduled to see her orthopedic surgeon on 2/23. She saw infectious disease 2/17 and her antibiotics were extended. She currently denies systemic signs of infection. 3/6; patient presents for follow-up. She had debridement and irrigation of her foot on 03/10/2021 due to abscess noted on MRI. She was started on IV ceftriaxone and oral Flagyl. She has no issues or complaints  today. She has been using iodoform packing to the tunnel and Dakin's wet-to-dry to the opening. 03/26/2021: She continues on IV ceftriaxone and oral metronidazole. She has follow-up with infectious disease tomorrow. No significant issues or complaints today. Her mother continues to help her with her wound dressing, using iodoform packing strips into the tunnel and Dakin's to the open portion of the wound. 3/20; patient presents for follow-up. She continues to be on IV ceftriaxone in oral metronidazole. She has been using iodoform to the tunnel and Dakin's wet-to- dry to the open wound. She denies signs of infection. 3/27; patient presents for follow-up. She no longer has a PICC line. She has been using iodoform to the tunnel and Dakin's wet-to-dry to the open wound. She reports improvement in wound healing. She denies signs of infection. 4/3; patient presents for follow-up. She states she has been using Hydrofera Blue to the open wound and iodoform packing to the tunnel without any issues. She denies signs of infection. 4/18; patient presents for follow-up. She saw infectious disease on 4/11. She has finished her oral antibiotics and completed a total of 6 weeks of antibiotics (this includes IV as well). No further antibiotics needed. She has been using Hydrofera Blue and iodoform packing. She states that the tunneled area has come in and the iodoform is not staying in place anymore. She has no issues or complaints today. She denies signs of infection. 4/24; patient presents for follow-up. She saw Dr. Carlene Coria, plastic surgery to discuss potential skin graft/substitute placement. At this time he thinks that the skin graft would likely not take. He is in agreement with trying a wound VAC. Patient has been using Hydrofera Blue dressing changes with no issues. She denies signs of infection. She reports improvement in wound healing. 5/1; patient presents for follow-up. Unfortunately patient did not have  insurance when we ran for the pico. There is an assistance program and we are trying to get this accommodated for the patient. In the meantime she has been using Hydrofera Blue without any issues. She denies signs of infection. 5/8; patient presents for follow-up. We have not heard back if pico is covered by her insurance. She has been using collagen to the  wound bed over the past week. She denies signs of infection. 5/18; patient presents for follow-up. She has been using collagen to the wound bed without issues. Again we have not heard if pico is covered by her insurance. She has no issues or complaints today. 5/23; patient presents for follow-up. She has been using collagen to the wound bed. She has no issues or complaints today. She obtained the wound VAC from [facility] and brought this in today. She denies signs of infection. 6/1; patient presents for follow-up. She has been using the wound VAC for the past week. She has had this changed twice since she was last here. She reports more maceration to the periwound. She denies signs of infection. 6/7; right TMA site. There are 2 wounds 0 separated by a bridge of healed tissue. The more lateral area has undermining. Both areas have healthy looking granulation at the base but relative the size of the wound is fairly deep. There is no exposed bone no evidence of infection. Her wound VAC was put on hold last week because of surrounding skin maceration she has been using collagen this week. She has a modified shoe 6/12; patient presents for follow-up. Last week the wound VAC was reinitiated. She had no issues with the wound VAC itself. Today she has maceration again noted to the surrounding skin. She denies signs of infection. 6/27; patient presents for follow-up. She has been using Medihoney to the wound bed. We took a break from the wound VAC because the periwound was macerated. She still has some areas of maceration to the distal foot where there is a  callus. She currently denies signs of infection. 7/11; patient presents for follow-up. She has been using Medihoney to the wound bed. She has developed some increased warmth and erythema to the lateral aspect of the right foot. She states this is occurred over the past week and there is increased pain. No drainage noted. 7/17; patient presents for follow-up. She has been using Medihoney to the wound bed. She completed her course of Bactrim. She reports improvement in symptoms. 7/24; Patient presents for follow up. She has been using Medihoney to the wound bed without issues. She completed another course of Bactrim. She reports improvement in her symptoms but still has some mild tenderness to the medial aspect of the foot. 7/31; Patient presents for follow-up. She has been using Medihoney and Dakin's to the wound bed. She denies signs of infection. Patient History Information obtained from Patient. Family History Cancer - Paternal Grandparents, Diabetes - Mother, Hypertension - Mother, Stroke - Maternal Grandparents, No family history of Heart Disease, Hereditary Spherocytosis, Kidney Disease, Lung Disease, Seizures, Thyroid Problems, Tuberculosis. Social History Never smoker, Marital Status - Single, Alcohol Use - Rarely, Drug Use - Prior History - Marijuana, Caffeine Use - Daily. Medical History Cardiovascular Patient has history of Hypertension Endocrine Patient has history of Type II Diabetes Musculoskeletal Patient has history of Osteomyelitis - Right Transmet 12/18/20 Neurologic Patient has history of Neuropathy Objective Constitutional respirations regular, non-labored and within target range for patient.. Vitals Time Taken: 3:14 PM, Height: 69 in, Temperature: 98 F, Pulse: 77 bpm, Respiratory Rate: 18 breaths/min, Blood Pressure: 111/75 mmHg, Capillary Blood Glucose: 113 mg/dl. Cardiovascular 2+ dorsalis pedis/posterior tibialis pulses. Psychiatric pleasant and  cooperative. General Notes: Right foot: T the transmetatarsal amputation site there is wound dehiscence. She has 2 wounds present. Middle portion of the wound has o healed. The lateral aspect has a small open wound with granulation tissue. The  medial aspect has a tunnel. No signs of surrounding infection. Integumentary (Hair, Skin) Wound #1 status is Healed - Epithelialized. Original cause of wound was Surgical Injury. The date acquired was: 12/15/2020. The wound has been in treatment 29 weeks. The wound is located on the Right Amputation Site - Transmetatarsal. The wound measures 0cm length x 0cm width x 0cm depth; 0cm^2 area and 0cm^3 volume. Wound #2 status is Open. Original cause of wound was Surgical Injury. The date acquired was: 07/10/2021. The wound has been in treatment 4 weeks. The wound is located on the Right,Medial Amputation Site - Transmetatarsal. The wound measures 0.2cm length x 0.3cm width x 1.5cm depth; 0.047cm^2 area and 0.071cm^3 volume. There is Fat Layer (Subcutaneous Tissue) exposed. There is no tunneling or undermining noted. There is a medium amount of serosanguineous drainage noted. The wound margin is distinct with the outline attached to the wound base. There is large (67-100%) red, pink granulation within the wound bed. There is a small (1-33%) amount of necrotic tissue within the wound bed including Adherent Slough. Wound #3 status is Open. Original cause of wound was Other Lesion. The date acquired was: 08/06/2021. The wound has been in treatment 1 weeks. The wound is located on the Right,Anterior Amputation Site - Transmetatarsal. The wound measures 0.3cm length x 0.5cm width x 0.3cm depth; 0.118cm^2 area and 0.035cm^3 volume. There is Fat Layer (Subcutaneous Tissue) exposed. There is no tunneling or undermining noted. There is a medium amount of serosanguineous drainage noted. The wound margin is distinct with the outline attached to the wound base. There is large  (67-100%) red granulation within the wound bed. There is a small (1-33%) amount of necrotic tissue within the wound bed including Adherent Slough. Assessment Active Problems ICD-10 Non-pressure chronic ulcer of other part of right foot with necrosis of bone Type 2 diabetes mellitus with foot ulcer Osteomyelitis, unspecified Patient's wound has shown improvement in size and appearance since last clinic visit. I recommended continuing Medihoney to the lateral wound bed. And Dakin's to the tunneled area on the medial aspect. No signs of surrounding infection. Continue aggressive offloading and follow-up in 1 week. Plan Follow-up Appointments: Return Appointment in 1 week. - Dr. Mikey Bussing and Lennox Laity, RN Room 7 08/20/2021 11:00am Bathing/ Shower/ Hygiene: Other Bathing/Shower/Hygiene Orders/Instructions: - Clean with Saline or Dakins Negative Presssure Wound Therapy: Discontinue wound vac - Call [employer] company and return wound vac. Edema Control - Lymphedema / SCD / Other: Elevate legs to the level of the heart or above for 30 minutes daily and/or when sitting, a frequency of: - throughout the day Avoid standing for long periods of time. Moisturize legs daily. Off-Loading: Open toe surgical shoe to: - May use surgical shoe Additional Orders / Instructions: Follow Nutritious Diet - -Monitor/Control Blood Sugar -High Protein Diet WOUND #2: - Amputation Site - Transmetatarsal Wound Laterality: Right, Medial Cleanser: Normal Saline (Generic) 1 x Per Day/30 Days Discharge Instructions: Cleanse the wound with Normal Saline prior to applying a clean dressing using gauze sponges, not tissue or cotton balls. Prim Dressing: Plain packing strip 1/4 (in) 1 x Per Day/30 Days ary Discharge Instructions: Lightly pack as instructed Prim Dressing: Dakin's Solution 0.25%, 16 (oz) 1 x Per Day/30 Days ary Discharge Instructions: Moisten packing strip with Dakin's solution Secondary Dressing: ABD Pad, 5x9 1 x Per  Day/30 Days Discharge Instructions: Apply over primary dressing as directed. Secondary Dressing: Woven Gauze Sponge, Non-Sterile 4x4 in 1 x Per Day/30 Days Discharge Instructions: Apply over primary dressing  as directed. Secured With: Elastic Bandage 4 inch (ACE bandage) (Generic) 1 x Per Day/30 Days Discharge Instructions: Secure with ACE bandage as directed. Secured With: [employer], 4.5x3.1 (in/yd) (Generic) 1 x Per Day/30 Days Discharge Instructions: Secure with Kerlix as directed. WOUND #3: - Amputation Site - Transmetatarsal Wound Laterality: Right, Anterior Cleanser: Normal Saline (Generic) 1 x Per Day/30 Days Discharge Instructions: Cleanse the wound with Normal Saline prior to applying a clean dressing using gauze sponges, not tissue or cotton balls. Prim Dressing: MediHoney Gel, tube 1.5 (oz) 1 x Per Day/30 Days ary Discharge Instructions: Apply to wound bed as instructed Secondary Dressing: ABD Pad, 5x9 1 x Per Day/30 Days Discharge Instructions: Apply over primary dressing as directed. Secondary Dressing: Woven Gauze Sponge, Non-Sterile 4x4 in 1 x Per Day/30 Days Discharge Instructions: Apply over primary dressing as directed. Secured With: Elastic Bandage 4 inch (ACE bandage) (Generic) 1 x Per Day/30 Days Discharge Instructions: Secure with ACE bandage as directed. Secured With: [employer], 4.5x3.1 (in/yd) (Generic) 1 x Per Day/30 Days Discharge Instructions: Secure with Kerlix as directed. 1. Medihoney 2. Dakin's wet-to-dry dressing 3. Follow-up in 1 week Electronic Signature(s) Signed: 08/14/2021 8:48:16 AM By: Geralyn Corwin DO Entered By: Geralyn Corwin on 08/13/2021 15:57:02 -------------------------------------------------------------------------------- HxROS Details Patient Name: Date of Service: [name], [name] 08/13/2021 2:45 PM Medical Record Number: [id number] Patient Account Number: [id number] Date of Birth/Sex: Treating RN: 05-Oct-1980  (41 y.o. [name] Primary Care Provider: Gwinda Passe Other Clinician: Referring Provider: Treating Provider/Extender: Grace Isaac in Treatment: 29 Information Obtained From Patient Cardiovascular Medical History: Positive for: Hypertension Endocrine Medical History: Positive for: Type II Diabetes Time with diabetes: Dx 2009 Treated with: Insulin, Oral agents Blood sugar tested every day: Yes Tested : daily Musculoskeletal Medical History: Positive for: Osteomyelitis - Right Transmet 12/18/20 Neurologic Medical History: Positive for: Neuropathy Immunizations Pneumococcal Vaccine: Received Pneumococcal Vaccination: No Implantable Devices Yes Family and Social History Cancer: Yes - Paternal Grandparents; Diabetes: Yes - Mother; Heart Disease: No; Hereditary Spherocytosis: No; Hypertension: Yes - Mother; Kidney Disease: No; Lung Disease: No; Seizures: No; Stroke: Yes - Maternal Grandparents; Thyroid Problems: No; Tuberculosis: No; Never smoker; Marital Status - Single; Alcohol Use: Rarely; Drug Use: Prior History - Marijuana; Caffeine Use: Daily; Financial Concerns: No; Food, Clothing or Shelter Needs: No; Support System Lacking: No; Transportation Concerns: No Electronic Signature(s) Signed: 08/13/2021 4:16:51 PM By: Antonieta Iba Signed: 08/14/2021 8:48:16 AM By: Geralyn Corwin DO Entered By: Geralyn Corwin on 08/13/2021 15:48:12 -------------------------------------------------------------------------------- SuperBill Details Patient Name: Date of Service: [name], [name] 08/13/2021 Medical Record Number: [id number] Patient Account Number: [id number] Date of Birth/Sex: Treating RN: 06-Aug-1980 (41 y.o. [name] Primary Care Provider: Gwinda Passe Other Clinician: Referring Provider: Treating Provider/Extender: Grace Isaac in Treatment: 29 Diagnosis Coding ICD-10 Codes Code  Description [phone] Non-pressure chronic ulcer of other part of right foot with necrosis of bone E11.621 Type 2 diabetes mellitus with foot ulcer M86.9 Osteomyelitis, unspecified Facility Procedures CPT4 Code: 21308657 Description: 99213 - WOUND CARE VISIT-LEV 3 EST PT Modifier: Quantity: 1 Physician Procedures : CPT4 Code Description Modifier [id number] 99213 - WC PHYS LEVEL 3 - EST PT ICD-10 Diagnosis Description L97.514 Non-pressure chronic ulcer of other part of right foot with necrosis of bone E11.621 Type 2 diabetes mellitus with foot ulcer M86.9  Osteomyelitis, unspecified Quantity: 1 Electronic Signature(s) Signed: 08/14/2021 8:48:16 AM By: Geralyn Corwin DO Entered By: Geralyn Corwin on 08/13/2021  15:57:15 

## 2021-08-20 ENCOUNTER — Other Ambulatory Visit: Payer: Self-pay

## 2021-08-20 ENCOUNTER — Encounter (HOSPITAL_BASED_OUTPATIENT_CLINIC_OR_DEPARTMENT_OTHER): Payer: Medicaid Other | Admitting: Internal Medicine

## 2021-08-23 ENCOUNTER — Other Ambulatory Visit: Payer: Self-pay

## 2021-08-24 ENCOUNTER — Other Ambulatory Visit: Payer: Self-pay

## 2021-08-27 ENCOUNTER — Other Ambulatory Visit: Payer: Self-pay

## 2021-08-27 ENCOUNTER — Encounter (HOSPITAL_BASED_OUTPATIENT_CLINIC_OR_DEPARTMENT_OTHER): Payer: Medicaid Other | Attending: Internal Medicine | Admitting: Internal Medicine

## 2021-08-27 DIAGNOSIS — Z794 Long term (current) use of insulin: Secondary | ICD-10-CM | POA: Insufficient documentation

## 2021-08-27 DIAGNOSIS — E1165 Type 2 diabetes mellitus with hyperglycemia: Secondary | ICD-10-CM | POA: Diagnosis not present

## 2021-08-27 DIAGNOSIS — Z89431 Acquired absence of right foot: Secondary | ICD-10-CM | POA: Insufficient documentation

## 2021-08-27 DIAGNOSIS — E1169 Type 2 diabetes mellitus with other specified complication: Secondary | ICD-10-CM | POA: Diagnosis not present

## 2021-08-27 DIAGNOSIS — M869 Osteomyelitis, unspecified: Secondary | ICD-10-CM | POA: Diagnosis not present

## 2021-08-27 DIAGNOSIS — E11621 Type 2 diabetes mellitus with foot ulcer: Secondary | ICD-10-CM | POA: Diagnosis not present

## 2021-08-27 DIAGNOSIS — L97514 Non-pressure chronic ulcer of other part of right foot with necrosis of bone: Secondary | ICD-10-CM | POA: Diagnosis not present

## 2021-08-27 DIAGNOSIS — Z86718 Personal history of other venous thrombosis and embolism: Secondary | ICD-10-CM | POA: Insufficient documentation

## 2021-08-27 DIAGNOSIS — Z7901 Long term (current) use of anticoagulants: Secondary | ICD-10-CM | POA: Diagnosis not present

## 2021-08-28 ENCOUNTER — Other Ambulatory Visit: Payer: Self-pay | Admitting: Pharmacist

## 2021-08-28 ENCOUNTER — Other Ambulatory Visit: Payer: Self-pay

## 2021-08-28 MED ORDER — LANTUS SOLOSTAR 100 UNIT/ML ~~LOC~~ SOPN
60.0000 [IU] | PEN_INJECTOR | Freq: Every day | SUBCUTANEOUS | 3 refills | Status: DC
  Filled 2021-08-28: qty 15, 25d supply, fill #0

## 2021-08-29 ENCOUNTER — Other Ambulatory Visit: Payer: Self-pay

## 2021-08-31 NOTE — Progress Notes (Signed)
[name], [name] ([id number]) Visit Report for 08/27/2021 Arrival Information Details Patient Name: Date of Service: [name], [name] [name] 08/27/2021 3:30 PM Medical Record Number: [id number] Patient Account Number: [id number] Date of Birth/Sex: Treating RN: [birth year] (41 y.o. [name] Primary Care Kayal Mula: Juluis Mire Other Clinician: Referring Milos Milligan: Treating Navy Belay/Extender: Edmonia Lynch in Treatment: 65 Visit Information History Since Last Visit Added or deleted any medications: No Patient Arrived: [name] Any new allergies or adverse reactions: No Arrival Time: 15:53 Had a fall or experienced change in No Accompanied By: mother activities of daily living that may affect Transfer Assistance: None risk of falls: Patient Identification Verified: Yes Signs or symptoms of abuse/neglect since last visito No Secondary Verification Process Completed: Yes Hospitalized since last visit: No Patient Requires Transmission-Based No Implantable device outside of the clinic excluding No Precautions: cellular tissue based products placed in the center Patient Has Alerts: Yes since last visit: Patient Alerts: Patient on Blood Thinner Has Dressing in Place as Prescribed: Yes PICC R Arm Pain Present Now: No ABI 12/17/20 R=1.08 L=1.13 Electronic Signature(s) Signed: 08/27/2021 4:34:00 PM By: Lorrin Jackson Entered By: Lorrin Jackson on 08/27/2021 15:53:30 -------------------------------------------------------------------------------- Encounter Discharge Information Details Patient Name: Date of Service: [name] V IS, [name] 08/27/2021 3:30 PM Medical Record Number: [id number] Patient Account Number: [id number] Date of Birth/Sex: Treating RN: [birth year] (41 y.o. [name] Primary Care Dayelin Balducci: Juluis Mire Other Clinician: Referring Ahriyah Vannest: Treating Levetta Bognar/Extender: Edmonia Lynch in Treatment:  31 Encounter Discharge Information Items Post Procedure Vitals Discharge Condition: Stable Temperature (F): 98.0 Ambulatory Status: Walker Pulse (bpm): 85 Discharge Destination: Home Respiratory Rate (breaths/min): 18 Transportation: Private Auto Blood Pressure (mmHg): 115/78 Accompanied By: mother Schedule Follow-up Appointment: Yes Clinical Summary of Care: Provided on 08/27/2021 Form Type Recipient Paper Patient Patient Electronic Signature(s) Signed: 08/27/2021 4:34:00 PM By: Lorrin Jackson Entered By: Lorrin Jackson on 08/27/2021 16:33:23 -------------------------------------------------------------------------------- Lower Extremity Assessment Details Patient Name: Date of Service: [name] V IS, [name] 08/27/2021 3:30 PM Medical Record Number: [id number] Patient Account Number: [id number] Date of Birth/Sex: Treating RN: Sep 22, 1980 (41 y.o. [name] Primary Care Kemyah Buser: Juluis Mire Other Clinician: Referring Addie Alonge: Treating Tinie Mcgloin/Extender: Geryl Councilman Weeks in Treatment: 31 Edema Assessment Assessed: Shirlyn Goltz: No] Patrice Paradise: Yes] Edema: [Left: Ye] [Right: s] Calf Left: Right: Point of Measurement: 34 cm From Medial Instep 55 cm Ankle Left: Right: Point of Measurement: 8 cm From Medial Instep 27.2 cm Vascular Assessment Pulses: Dorsalis Pedis Palpable: [Right:Yes] Electronic Signature(s) Signed: 08/27/2021 4:34:00 PM By: Lorrin Jackson Entered By: Lorrin Jackson on 08/27/2021 16:04:39 -------------------------------------------------------------------------------- Multi Wound Chart Details Patient Name: Date of Service: [name] V IS, [name] 08/27/2021 3:30 PM Medical Record Number: [id number] Patient Account Number: [id number] Date of Birth/Sex: Treating RN: 1980-03-26 (41 y.o. F) Primary Care Anice Wilshire: Juluis Mire Other Clinician: Referring Terralyn Matsumura: Treating Wrenn Willcox/Extender: Edmonia Lynch in Treatment: 31 Vital Signs Height(in): 69 Capillary Blood Glucose(mg/dl): 142 Weight(lbs): Pulse(bpm): 85 Body Mass Index(BMI): Blood Pressure(mmHg): 115/78 Temperature(F): 98.0 Respiratory Rate(breaths/min): 18 Photos: [2:Right, Medial Amputation Site -] [3:Right, Anterior Amputation Site -] [N/A:N/A N/A] Wound Location: [2:Transmetatarsal Surgical Injury] [3:Transmetatarsal Other Lesion] [N/A:N/A] Wounding Event: [2:Dehisced Wound] [3:Diabetic Wound/Ulcer of the Lower] [N/A:N/A] Primary Etiology: [2:Hypertension, Type II Diabetes,] [3:Extremity Hypertension, Type II Diabetes, N/A] Comorbid History: [2:Osteomyelitis, Neuropathy 07/10/2021] [3:Osteomyelitis, Neuropathy 08/06/2021] [N/A:N/A] Date Acquired: [2:6] [3:3] [N/A:N/A] Weeks of Treatment: [2:Open] [3:Open] [N/A:N/A] Wound Status: [2:No] [3:No] [N/A:N/A] Wound Recurrence: [2:0.3x0.3x0.9] [3:0.3x0.5x0.2] [N/A:N/A]  Measurements L x W x D (cm) [2:0.071] [3:0.118] [N/A:N/A] A (cm) : rea [2:0.064] [3:0.024] [N/A:N/A] Volume (cm) : [2:39.80%] [3:-151.10%] [N/A:N/A] % Reduction in A [2:rea: -82.90%] [3:-26.30%] [N/A:N/A] % Reduction in Volume: [2:Full Thickness Without Exposed] [3:Grade 1] [N/A:N/A] Classification: [2:Support Structures Medium] [3:Medium] [N/A:N/A] Exudate A mount: [2:Serosanguineous] [3:Serosanguineous] [N/A:N/A] Exudate Type: [2:red, brown] [3:red, brown] [N/A:N/A] Exudate Color: [2:Distinct, outline attached] [3:Distinct, outline attached] [N/A:N/A] Wound Margin: [2:Large (67-100%)] [3:Large (67-100%)] [N/A:N/A] Granulation A mount: [2:Red, Pink] [3:Red] [N/A:N/A] Granulation Quality: [2:Small (1-33%)] [3:Small (1-33%)] [N/A:N/A] Necrotic A mount: [2:Fat Layer (Subcutaneous Tissue): Yes Fat Layer (Subcutaneous Tissue): Yes N/A] Exposed Structures: [2:Fascia: No Tendon: No Muscle: No Joint: No Bone: No Medium (34-66%)] [3:Fascia: No Tendon: No Muscle: No Joint: No Bone: No None]  [N/A:N/A] Epithelialization: [2:N/A] [3:Debridement - Excisional] [N/A:N/A] Debridement: Pre-procedure Verification/Time Out N/A [3:16:02] [N/A:N/A] Taken: [2:N/A] [3:Lidocaine 4% Topical Solution] [N/A:N/A] Pain Control: [2:N/A] [3:Subcutaneous, Slough] [N/A:N/A] Tissue Debrided: [2:N/A] [3:Skin/Subcutaneous Tissue] [N/A:N/A] Level: [2:N/A] [3:0.15] [N/A:N/A] Debridement A (sq cm): [2:rea N/A] [3:Curette] [N/A:N/A] Instrument: [2:N/A] [3:Minimum] [N/A:N/A] Bleeding: [2:N/A] [3:Pressure] [N/A:N/A] Hemostasis A chieved: [2:N/A] [3:Procedure was tolerated well] [N/A:N/A] Debridement Treatment Response: [2:N/A] [3:0.3x0.5x0.2] [N/A:N/A] Post Debridement Measurements L x W x D (cm) [2:N/A] [3:0.024] [N/A:N/A] Post Debridement Volume: (cm) [2:N/A] [3:Debridement] [N/A:N/A] Treatment Notes Electronic Signature(s) Signed: 08/27/2021 4:17:44 PM By: Kalman Shan DO Entered By: Kalman Shan on 08/27/2021 16:11:44 -------------------------------------------------------------------------------- Multi-Disciplinary Care Plan Details Patient Name: Date of Service: [name] V IS, [name] 08/27/2021 3:30 PM Medical Record Number: [id number] Patient Account Number: [id number] Date of Birth/Sex: Treating RN: May 22, 1980 (41 y.o. [name] Primary Care Tarick Parenteau: Juluis Mire Other Clinician: Referring Adelfa Lozito: Treating Kaimani Clayson/Extender: Edmonia Lynch in Treatment: 31 Active Inactive Nutrition Nursing Diagnoses: Impaired glucose control: actual or potential Goals: Patient/caregiver verbalizes understanding of need to maintain therapeutic glucose control per primary care physician Date Initiated: 01/22/2021 Target Resolution Date: 09/10/2021 Goal Status: Active Interventions: Assess HgA1c results as ordered upon admission and as needed Provide education on elevated blood sugars and impact on wound healing Treatment Activities: Obtain HgA1c :  01/22/2021 Notes: 07/10/21: Glucose control ongoing. Wound/Skin Impairment Nursing Diagnoses: Impaired tissue integrity Goals: Patient/caregiver will verbalize understanding of skin care regimen Date Initiated: 01/22/2021 Target Resolution Date: 09/10/2021 Goal Status: Active Ulcer/skin breakdown will have a volume reduction of 30% by week 4 Date Initiated: 01/22/2021 Date Inactivated: 03/19/2021 Target Resolution Date: 03/16/2021 Goal Status: Met Ulcer/skin breakdown will have a volume reduction of 50% by week 8 Date Initiated: 03/19/2021 Date Inactivated: 05/14/2021 Target Resolution Date: 04/16/2021 Unmet Reason: see wound Goal Status: Unmet measurements Interventions: Assess patient/caregiver ability to obtain necessary supplies Assess patient/caregiver ability to perform ulcer/skin care regimen upon admission and as needed Assess ulceration(s) every visit Provide education on ulcer and skin care Treatment Activities: Topical wound management initiated : 01/22/2021 Notes: 06/04/21: Wound vac started 07/10/21: Wound care regimen continues Electronic Signature(s) Signed: 08/27/2021 4:34:00 PM By: Lorrin Jackson Entered By: Lorrin Jackson on 08/27/2021 15:49:41 -------------------------------------------------------------------------------- Pain Assessment Details Patient Name: Date of Service: [name] [name] IS, [name] 08/27/2021 3:30 PM Medical Record Number: [id number] Patient Account Number: [id number] Date of Birth/Sex: Treating RN: 1980-02-01 (41 y.o. [name] Primary Care Tommie Bohlken: Juluis Mire Other Clinician: Referring Asif Muchow: Treating Nevaan Bunton/Extender: Edmonia Lynch in Treatment: 31 Active Problems Location of Pain Severity and Description of Pain Patient Has Paino No Site Locations Pain Management and Medication Current Pain Management: Electronic Signature(s) Signed: 08/27/2021 4:34:00 PM By: Fara Chute  By: Lorrin Jackson on  08/27/2021 15:55:28 -------------------------------------------------------------------------------- Patient/Caregiver Education Details Patient Name: Date of Service: [name] V IS, [name] 8/14/2023andnbsp3:30 PM Medical Record Number: [id number] Patient Account Number: [id number] Date of Birth/Gender: Treating RN: [birth year] (41 y.o. [name] Primary Care Physician: Juluis Mire Other Clinician: Referring Physician: Treating Physician/Extender: Edmonia Lynch in Treatment: 31 Education Assessment Education Provided To: Patient Education Topics Provided Elevated Blood Sugar/ Impact on Healing: Methods: Explain/Verbal, Printed Responses: State content correctly Wound/Skin Impairment: Methods: Explain/Verbal, Printed Responses: State content correctly Electronic Signature(s) Signed: 08/27/2021 4:34:00 PM By: Lorrin Jackson Entered By: Lorrin Jackson on 08/27/2021 15:49:58 -------------------------------------------------------------------------------- Wound Assessment Details Patient Name: Date of Service: [name] V IS, [name] 08/27/2021 3:30 PM Medical Record Number: [id number] Patient Account Number: [id number] Date of Birth/Sex: Treating RN: Apr 15, 1980 (41 y.o. [name] Primary Care Jameca Chumley: Juluis Mire Other Clinician: Referring Betty Brooks: Treating Fabiola Mudgett/Extender: Edmonia Lynch in Treatment: 31 Wound Status Wound Number: 2 Primary Etiology: Dehisced Wound Wound Location: Right, Medial Amputation Site - Transmetatarsal Wound Status: Open Wounding Event: Surgical Injury Comorbid Hypertension, Type II Diabetes, Osteomyelitis, History: Neuropathy Date Acquired: 07/10/2021 Weeks Of Treatment: 6 Clustered Wound: No Photos Wound Measurements Length: (cm) 0.3 Width: (cm) 0.3 Depth: (cm) 0.9 Area: (cm) 0.071 Volume: (cm) 0.064 % Reduction in Area: 39.8% % Reduction in Volume:  -82.9% Epithelialization: Medium (34-66%) Undermining: No Wound Description Classification: Full Thickness Without Exposed Support Structures Wound Margin: Distinct, outline attached Exudate Amount: Medium Exudate Type: Serosanguineous Exudate Color: red, brown Foul Odor After Cleansing: No Slough/Fibrino Yes Wound Bed Granulation Amount: Large (67-100%) Exposed Structure Granulation Quality: Red, Pink Fascia Exposed: No Necrotic Amount: Small (1-33%) Fat Layer (Subcutaneous Tissue) Exposed: Yes Necrotic Quality: Adherent Slough Tendon Exposed: No Muscle Exposed: No Joint Exposed: No Bone Exposed: No Treatment Notes Wound #2 (Amputation Site - Transmetatarsal) Wound Laterality: Right, Medial Cleanser Normal Saline Discharge Instruction: Cleanse the wound with Normal Saline prior to applying a clean dressing using gauze sponges, not tissue or cotton balls. Peri-Wound Care Topical Primary Dressing Plain packing strip 1/4 (in) Discharge Instruction: Lightly pack as instructed Dakin's Solution 0.25%, 16 (oz) Discharge Instruction: Moisten packing strip with Dakin's solution Secondary Dressing ABD Pad, 5x9 Discharge Instruction: Apply over primary dressing as directed. Woven Gauze Sponge, Non-Sterile 4x4 in Discharge Instruction: Apply over primary dressing as directed. Secured With Elastic Bandage 4 inch (ACE bandage) Discharge Instruction: Secure with ACE bandage as directed. Kerlix Roll Sterile, 4.5x3.1 (in/yd) Discharge Instruction: Secure with Kerlix as directed. Compression Wrap Compression Stockings Add-Ons Electronic Signature(s) Signed: 08/27/2021 4:34:00 PM By: Lorrin Jackson Entered By: Lorrin Jackson on 08/27/2021 16:01:10 -------------------------------------------------------------------------------- Wound Assessment Details Patient Name: Date of Service: [name] [name] IS, [name] 08/27/2021 3:30 PM Medical Record Number: [id number] Patient Account Number:  [id number] Date of Birth/Sex: Treating RN: 04/13/1980 (41 y.o. [name] Primary Care Jonisha Kindig: Juluis Mire Other Clinician: Referring Aryan Bello: Treating Kelli Egolf/Extender: Edmonia Lynch in Treatment: 31 Wound Status Wound Number: 3 Primary Etiology: Diabetic Wound/Ulcer of the Lower Extremity Wound Location: Right, Anterior Amputation Site - Transmetatarsal Wound Status: Open Wounding Event: Other Lesion Comorbid Hypertension, Type II Diabetes, Osteomyelitis, History: Neuropathy Date Acquired: 08/06/2021 Weeks Of Treatment: 3 Clustered Wound: No Photos Wound Measurements Length: (cm) 0.3 Width: (cm) 0.5 Depth: (cm) 0.2 Area: (cm) 0.118 Volume: (cm) 0.024 % Reduction in Area: -151.1% % Reduction in Volume: -26.3% Epithelialization: None Tunneling: No Undermining: No Wound Description Classification: Grade 1 Wound Margin: Distinct,  outline attached Exudate Amount: Medium Exudate Type: Serosanguineous Exudate Color: red, brown Foul Odor After Cleansing: No Slough/Fibrino Yes Wound Bed Granulation Amount: Large (67-100%) Exposed Structure Granulation Quality: Red Fascia Exposed: No Necrotic Amount: Small (1-33%) Fat Layer (Subcutaneous Tissue) Exposed: Yes Necrotic Quality: Adherent Slough Tendon Exposed: No Muscle Exposed: No Joint Exposed: No Bone Exposed: No Treatment Notes Wound #3 (Amputation Site - Transmetatarsal) Wound Laterality: Right, Anterior Cleanser Normal Saline Discharge Instruction: Cleanse the wound with Normal Saline prior to applying a clean dressing using gauze sponges, not tissue or cotton balls. Peri-Wound Care Topical Primary Dressing MediHoney Gel, tube 1.5 (oz) Discharge Instruction: Apply to wound bed as instructed Secondary Dressing ABD Pad, 5x9 Discharge Instruction: Apply over primary dressing as directed. Woven Gauze Sponge, Non-Sterile 4x4 in Discharge Instruction: Apply over primary  dressing as directed. Secured With Elastic Bandage 4 inch (ACE bandage) Discharge Instruction: Secure with ACE bandage as directed. Kerlix Roll Sterile, 4.5x3.1 (in/yd) Discharge Instruction: Secure with Kerlix as directed. Compression Wrap Compression Stockings Add-Ons Electronic Signature(s) Signed: 08/27/2021 4:34:00 PM By: Lorrin Jackson Entered By: Lorrin Jackson on 08/27/2021 16:02:15 -------------------------------------------------------------------------------- Vitals Details Patient Name: Date of Service: [name] V IS, [name] 08/27/2021 3:30 PM Medical Record Number: [id number] Patient Account Number: [id number] Date of Birth/Sex: Treating RN: 03/26/1980 (41 y.o. [name] Primary Care Jameek Bruntz: Juluis Mire Other Clinician: Referring Marylee Belzer: Treating Khady Vandenberg/Extender: Edmonia Lynch in Treatment: 31 Vital Signs Time Taken: 15:54 Temperature (F): 98.0 Height (in): 69 Pulse (bpm): 85 Respiratory Rate (breaths/min): 18 Blood Pressure (mmHg): 115/78 Capillary Blood Glucose (mg/dl): 142 Reference Range: 80 - 120 mg / dl Electronic Signature(s) Signed: 08/27/2021 4:34:00 PM By: Lorrin Jackson Entered By: Lorrin Jackson on 08/27/2021 15:55:24

## 2021-08-31 NOTE — Progress Notes (Signed)
Weight, Kenadie L. ([id number]) Visit Report for 08/27/2021 Chief Complaint Document Details Patient Name: Date of Service: [name] IS, [place] [name] 08/27/2021 3:30 PM Medical Record Number: [id number] Patient Account Number: [id number] Date of Birth/Sex: Treating RN: 01-Feb-1980 (41 y.o. F) Primary Care Provider: Gwinda Passe Other Clinician: Referring Provider: Treating Provider/Extender: Grace Isaac in Treatment: 31 Information Obtained from: Patient Chief Complaint Osteomyelitis of the right foot status post transmetatarsal amputation with surgical site dehiscence Electronic Signature(s) Signed: 08/27/2021 4:17:44 PM By: Geralyn Corwin DO Entered By: Geralyn Corwin on 08/27/2021 16:11:54 -------------------------------------------------------------------------------- Debridement Details Patient Name: Date of Service: [name], [name] 08/27/2021 3:30 PM Medical Record Number: [id number] Patient Account Number: [id number] Date of Birth/Sex: Treating RN: August 14, 1980 (41 y.o. [name] Primary Care Provider: Gwinda Passe Other Clinician: Referring Provider: Treating Provider/Extender: Grace Isaac in Treatment: 31 Debridement Performed for Assessment: Wound #3 Right,Anterior Amputation Site - Transmetatarsal Performed By: Physician Geralyn Corwin, DO Debridement Type: Debridement Severity of Tissue Pre Debridement: Fat layer exposed Level of Consciousness (Pre-procedure): Awake and Alert Pre-procedure Verification/Time Out Yes - 16:02 Taken: Start Time: 16:03 Pain Control: Lidocaine 4% T opical Solution T Area Debrided (L x W): otal 0.3 (cm) x 0.5 (cm) = 0.15 (cm) Tissue and other material debrided: Non-Viable, Slough, Subcutaneous, Slough Level: Skin/Subcutaneous Tissue Debridement Description: Excisional Instrument: Curette Bleeding: Minimum Hemostasis Achieved: Pressure End Time: 16:07 Response to  Treatment: Procedure was tolerated well Level of Consciousness (Post- Awake and Alert procedure): Post Debridement Measurements of Total Wound Length: (cm) 0.3 Width: (cm) 0.5 Depth: (cm) 0.2 Volume: (cm) 0.024 Character of Wound/Ulcer Post Debridement: Stable Severity of Tissue Post Debridement: Fat layer exposed Post Procedure Diagnosis Same as Pre-procedure Electronic Signature(s) Signed: 08/27/2021 4:17:44 PM By: Geralyn Corwin DO Signed: 08/27/2021 4:34:00 PM By: Antonieta Iba Entered By: Antonieta Iba on 08/27/2021 16:10:31 -------------------------------------------------------------------------------- HPI Details Patient Name: Date of Service: [name], [name] 08/27/2021 3:30 PM Medical Record Number: [id number] Patient Account Number: [id number] Date of Birth/Sex: Treating RN: May 08, 1980 (41 y.o. F) Primary Care Provider: Gwinda Passe Other Clinician: Referring Provider: Treating Provider/Extender: Grace Isaac in Treatment: 31 History of Present Illness HPI Description: Admission 01/22/2021 [name] is a 41 year old female with a past medical history of insulin-dependent uncontrolled type 2 diabetes with last hemoglobin A1c of 13.5, osteomyelitis of the right foot status post transmetatarsal amputation on 12/18/2020 that presents to the clinic for right foot wound. She has had dehiscence of the surgical site. She is currently using wet-to-dry dressings. She has a PICC line and receiving IV ceftriaxone daily for her osteomyelitis. There is an end date of 01/27/2021. She is also taking oral metronidazole. She currently denies systemic signs of infection. 1/19; patient presents for follow-up. She was diagnosed with a DVT to the right lower extremity 2 days ago. She is on Eliquis now. She is scheduled to see her infectious disease doctor tomorrow. She has been using Dakin's wet-to-dry dressings. She denies systemic signs of  infection. 1/26; patient presents for follow-up. She saw infectious disease on 1/21 started on Augmentin. Her PICC line and IV ceftriaxone was discontinued. Patient reports stability to her wound. She has been using Dakin's wet-to-dry dressings. She currently denies systemic signs of infection. 2/3; patient presents for follow-up. She continues to use Dakin's wet-to-dry dressings to the wound bed. She saw Dr. Manson Passey with infectious disease yesterday and is continuing Augmentin. T entative end date is 2/16. Patient  reports following up with orthopedics. She states there is no further plan from them. She currently denies systemic signs of infection. 2/10; patient presents for follow-up. She continues to use Dakin's wet to dry dressings. She is scheduled to have her MRI done on 2/14. She states that she had pain to the debridement site from last clinic visit and declines debridement today. She denies systemic signs of infection. She continues to have yellow thick drainage. 2/20; patient presents for follow-up. She continues to use Dakin's wet-to-dry dressings. She obtained her MRI. The results showed an abscess and she is scheduled to see her orthopedic surgeon on 2/23. She saw infectious disease 2/17 and her antibiotics were extended. She currently denies systemic signs of infection. 3/6; patient presents for follow-up. She had debridement and irrigation of her foot on 03/10/2021 due to abscess noted on MRI. She was started on IV ceftriaxone and oral Flagyl. She has no issues or complaints today. She has been using iodoform packing to the tunnel and Dakin's wet-to-dry to the opening. 03/26/2021: She continues on IV ceftriaxone and oral metronidazole. She has follow-up with infectious disease tomorrow. No significant issues or complaints today. Her mother continues to help her with her wound dressing, using iodoform packing strips into the tunnel and Dakin's to the open portion of the wound. 3/20;  patient presents for follow-up. She continues to be on IV ceftriaxone in oral metronidazole. She has been using iodoform to the tunnel and Dakin's wet-to- dry to the open wound. She denies signs of infection. 3/27; patient presents for follow-up. She no longer has a PICC line. She has been using iodoform to the tunnel and Dakin's wet-to-dry to the open wound. She reports improvement in wound healing. She denies signs of infection. 4/3; patient presents for follow-up. She states she has been using Hydrofera Blue to the open wound and iodoform packing to the tunnel without any issues. She denies signs of infection. 4/18; patient presents for follow-up. She saw infectious disease on 4/11. She has finished her oral antibiotics and completed a total of 6 weeks of antibiotics (this includes IV as well). No further antibiotics needed. She has been using Hydrofera Blue and iodoform packing. She states that the tunneled area has come in and the iodoform is not staying in place anymore. She has no issues or complaints today. She denies signs of infection. 4/24; patient presents for follow-up. She saw Dr. Carlene CoriaLubin's, plastic surgery to discuss potential skin graft/substitute placement. At this time he thinks that the skin graft would likely not take. He is in agreement with trying a wound VAC. Patient has been using Hydrofera Blue dressing changes with no issues. She denies signs of infection. She reports improvement in wound healing. 5/1; patient presents for follow-up. Unfortunately patient did not have insurance when we ran for the pico. There is an assistance program and we are trying to get this accommodated for the patient. In the meantime she has been using Hydrofera Blue without any issues. She denies signs of infection. 5/8; patient presents for follow-up. We have not heard back if pico is covered by her insurance. She has been using collagen to the wound bed over the past week. She denies signs of  infection. 5/18; patient presents for follow-up. She has been using collagen to the wound bed without issues. Again we have not heard if pico is covered by her insurance. She has no issues or complaints today. 5/23; patient presents for follow-up. She has been using collagen to the wound  bed. She has no issues or complaints today. She obtained the wound VAC from [facility] and brought this in today. She denies signs of infection. 6/1; patient presents for follow-up. She has been using the wound VAC for the past week. She has had this changed twice since she was last here. She reports more maceration to the periwound. She denies signs of infection. 6/7; right TMA site. There are 2 wounds 0 separated by a bridge of healed tissue. The more lateral area has undermining. Both areas have healthy looking granulation at the base but relative the size of the wound is fairly deep. There is no exposed bone no evidence of infection. Her wound VAC was put on hold last week because of surrounding skin maceration she has been using collagen this week. She has a modified shoe 6/12; patient presents for follow-up. Last week the wound VAC was reinitiated. She had no issues with the wound VAC itself. Today she has maceration again noted to the surrounding skin. She denies signs of infection. 6/27; patient presents for follow-up. She has been using Medihoney to the wound bed. We took a break from the wound VAC because the periwound was macerated. She still has some areas of maceration to the distal foot where there is a callus. She currently denies signs of infection. 7/11; patient presents for follow-up. She has been using Medihoney to the wound bed. She has developed some increased warmth and erythema to the lateral aspect of the right foot. She states this is occurred over the past week and there is increased pain. No drainage noted. 7/17; patient presents for follow-up. She has been using Medihoney to the wound bed. She  completed her course of Bactrim. She reports improvement in symptoms. 7/24; Patient presents for follow up. She has been using Medihoney to the wound bed without issues. She completed another course of Bactrim. She reports improvement in her symptoms but still has some mild tenderness to the medial aspect of the foot. 7/31; Patient presents for follow-up. She has been using Medihoney and Dakin's to the wound bed. She denies signs of infection. 8/14; patient presents for follow-up. She has been using Dakin's wet-to-dry packing strips to the right medial aspect of the amputation site and Medihoney to the anterior site. She has no issues or complaints today. She has started physical therapy. She denies signs of infection. Electronic Signature(s) Signed: 08/27/2021 4:17:44 PM By: Geralyn Corwin DO Entered By: Geralyn Corwin on 08/27/2021 16:13:03 -------------------------------------------------------------------------------- Physical Exam Details Patient Name: Date of Service: [name], [name] 08/27/2021 3:30 PM Medical Record Number: [id number] Patient Account Number: [id number] Date of Birth/Sex: Treating RN: 1980/09/02 (41 y.o. F) Primary Care Provider: Gwinda Passe Other Clinician: Referring Provider: Treating Provider/Extender: Grace Isaac in Treatment: 31 Constitutional respirations regular, non-labored and within target range for patient.. Cardiovascular 2+ dorsalis pedis/posterior tibialis pulses. Psychiatric pleasant and cooperative. Notes Right foot: T the transmetatarsal amputation site there is wound dehiscence. She has 2 wounds present. Middle portion of the wound has healed. The lateral o aspect has a small open wound with Nonviable tissue. Post-debridement there is granulation tissue throughout.. The medial aspect has a tunnel. No signs of surrounding infection. Electronic Signature(s) Signed: 08/27/2021 4:17:44 PM By: Geralyn Corwin  DO Entered By: Geralyn Corwin on 08/27/2021 16:13:41 -------------------------------------------------------------------------------- Physician Orders Details Patient Name: Date of Service: [name], [name] 08/27/2021 3:30 PM Medical Record Number: [id number] Patient Account Number: [id number] Date of Birth/Sex: Treating RN: 05-Jul-1980 (  41 y.o. [name] Primary Care Provider: Gwinda Passe Other Clinician: Referring Provider: Treating Provider/Extender: Grace Isaac in Treatment: 31 Verbal / Phone Orders: No Diagnosis Coding ICD-10 Coding Code Description L97.514 Non-pressure chronic ulcer of other part of right foot with necrosis of bone E11.621 Type 2 diabetes mellitus with foot ulcer M86.9 Osteomyelitis, unspecified Follow-up Appointments ppointment in 2 weeks. - Dr. Mikey Bussing and Lennox Laity, RN Room 7 09/10/2021 3:30pm Return A Bathing/ Shower/ Hygiene Other Bathing/Shower/Hygiene Orders/Instructions: - Clean with Saline or Dakins Edema Control - Lymphedema / SCD / Other Elevate legs to the level of the heart or above for 30 minutes daily and/or when sitting, a frequency of: - throughout the day Avoid standing for long periods of time. Moisturize legs daily. Off-Loading Open toe surgical shoe to: - May use surgical shoe Additional Orders / Instructions Follow Nutritious Diet - -Monitor/Control Blood Sugar -High Protein Diet Wound Treatment Wound #2 - Amputation Site - Transmetatarsal Wound Laterality: Right, Medial Cleanser: Normal Saline (Generic) 1 x Per Day/30 Days Discharge Instructions: Cleanse the wound with Normal Saline prior to applying a clean dressing using gauze sponges, not tissue or cotton balls. Prim Dressing: Plain packing strip 1/4 (in) 1 x Per Day/30 Days ary Discharge Instructions: Lightly pack as instructed Prim Dressing: Dakin's Solution 0.25%, 16 (oz) 1 x Per Day/30 Days ary Discharge Instructions: Moisten  packing strip with Dakin's solution Secondary Dressing: ABD Pad, 5x9 1 x Per Day/30 Days Discharge Instructions: Apply over primary dressing as directed. Secondary Dressing: Woven Gauze Sponge, Non-Sterile 4x4 in 1 x Per Day/30 Days Discharge Instructions: Apply over primary dressing as directed. Secured With: Elastic Bandage 4 inch (ACE bandage) (Generic) 1 x Per Day/30 Days Discharge Instructions: Secure with ACE bandage as directed. Secured With: [employer], 4.5x3.1 (in/yd) (Generic) 1 x Per Day/30 Days Discharge Instructions: Secure with Kerlix as directed. Wound #3 - Amputation Site - Transmetatarsal Wound Laterality: Right, Anterior Cleanser: Normal Saline (Generic) 1 x Per Day/30 Days Discharge Instructions: Cleanse the wound with Normal Saline prior to applying a clean dressing using gauze sponges, not tissue or cotton balls. Prim Dressing: MediHoney Gel, tube 1.5 (oz) 1 x Per Day/30 Days ary Discharge Instructions: Apply to wound bed as instructed Secondary Dressing: ABD Pad, 5x9 1 x Per Day/30 Days Discharge Instructions: Apply over primary dressing as directed. Secondary Dressing: Woven Gauze Sponge, Non-Sterile 4x4 in 1 x Per Day/30 Days Discharge Instructions: Apply over primary dressing as directed. Secured With: Elastic Bandage 4 inch (ACE bandage) (Generic) 1 x Per Day/30 Days Discharge Instructions: Secure with ACE bandage as directed. Secured With: [employer], 4.5x3.1 (in/yd) (Generic) 1 x Per Day/30 Days Discharge Instructions: Secure with Kerlix as directed. Electronic Signature(s) Signed: 08/27/2021 4:17:44 PM By: Geralyn Corwin DO Entered By: Geralyn Corwin on 08/27/2021 16:16:12 -------------------------------------------------------------------------------- Problem List Details Patient Name: Date of Service: [name], [name] 08/27/2021 3:30 PM Medical Record Number: [id number] Patient Account Number: [id number] Date of Birth/Sex: Treating  RN: August 23, 1980 (41 y.o. [name] Primary Care Provider: Gwinda Passe Other Clinician: Referring Provider: Treating Provider/Extender: Grace Isaac in Treatment: 31 Active Problems ICD-10 Encounter Code Description Active Date MDM Diagnosis L97.514 Non-pressure chronic ulcer of other part of right foot with necrosis of bone 01/22/2021 No Yes E11.621 Type 2 diabetes mellitus with foot ulcer 01/22/2021 No Yes M86.9 Osteomyelitis, unspecified 01/22/2021 No Yes Inactive Problems Resolved Problems Electronic Signature(s) Signed: 08/27/2021 4:17:44 PM By: Geralyn Corwin DO Entered  By: Geralyn Corwin on 08/27/2021 16:11:36 -------------------------------------------------------------------------------- Progress Note Details Patient Name: Date of Service: [name], [name] 08/27/2021 3:30 PM Medical Record Number: [id number] Patient Account Number: [id number] Date of Birth/Sex: Treating RN: [birth year] (41 y.o. F) Primary Care Provider: Gwinda Passe Other Clinician: Referring Provider: Treating Provider/Extender: Grace Isaac in Treatment: 31 Subjective Chief Complaint Information obtained from Patient Osteomyelitis of the right foot status post transmetatarsal amputation with surgical site dehiscence History of Present Illness (HPI) Admission 01/22/2021 [name] is a 41 year old female with a past medical history of insulin-dependent uncontrolled type 2 diabetes with last hemoglobin A1c of 13.5, osteomyelitis of the right foot status post transmetatarsal amputation on 12/18/2020 that presents to the clinic for right foot wound. She has had dehiscence of the surgical site. She is currently using wet-to-dry dressings. She has a PICC line and receiving IV ceftriaxone daily for her osteomyelitis. There is an end date of 01/27/2021. She is also taking oral metronidazole. She currently denies systemic signs of  infection. 1/19; patient presents for follow-up. She was diagnosed with a DVT to the right lower extremity 2 days ago. She is on Eliquis now. She is scheduled to see her infectious disease doctor tomorrow. She has been using Dakin's wet-to-dry dressings. She denies systemic signs of infection. 1/26; patient presents for follow-up. She saw infectious disease on 1/21 started on Augmentin. Her PICC line and IV ceftriaxone was discontinued. Patient reports stability to her wound. She has been using Dakin's wet-to-dry dressings. She currently denies systemic signs of infection. 2/3; patient presents for follow-up. She continues to use Dakin's wet-to-dry dressings to the wound bed. She saw Dr. Manson Passey with infectious disease yesterday and is continuing Augmentin. T entative end date is 2/16. Patient reports following up with orthopedics. She states there is no further plan from them. She currently denies systemic signs of infection. 2/10; patient presents for follow-up. She continues to use Dakin's wet to dry dressings. She is scheduled to have her MRI done on 2/14. She states that she had pain to the debridement site from last clinic visit and declines debridement today. She denies systemic signs of infection. She continues to have yellow thick drainage. 2/20; patient presents for follow-up. She continues to use Dakin's wet-to-dry dressings. She obtained her MRI. The results showed an abscess and she is scheduled to see her orthopedic surgeon on 2/23. She saw infectious disease 2/17 and her antibiotics were extended. She currently denies systemic signs of infection. 3/6; patient presents for follow-up. She had debridement and irrigation of her foot on 03/10/2021 due to abscess noted on MRI. She was started on IV ceftriaxone and oral Flagyl. She has no issues or complaints today. She has been using iodoform packing to the tunnel and Dakin's wet-to-dry to the opening. 03/26/2021: She continues on IV  ceftriaxone and oral metronidazole. She has follow-up with infectious disease tomorrow. No significant issues or complaints today. Her mother continues to help her with her wound dressing, using iodoform packing strips into the tunnel and Dakin's to the open portion of the wound. 3/20; patient presents for follow-up. She continues to be on IV ceftriaxone in oral metronidazole. She has been using iodoform to the tunnel and Dakin's wet-to- dry to the open wound. She denies signs of infection. 3/27; patient presents for follow-up. She no longer has a PICC line. She has been using iodoform to the tunnel and Dakin's wet-to-dry to the open wound. She reports improvement in wound healing. She denies  signs of infection. 4/3; patient presents for follow-up. She states she has been using Hydrofera Blue to the open wound and iodoform packing to the tunnel without any issues. She denies signs of infection. 4/18; patient presents for follow-up. She saw infectious disease on 4/11. She has finished her oral antibiotics and completed a total of 6 weeks of antibiotics (this includes IV as well). No further antibiotics needed. She has been using Hydrofera Blue and iodoform packing. She states that the tunneled area has come in and the iodoform is not staying in place anymore. She has no issues or complaints today. She denies signs of infection. 4/24; patient presents for follow-up. She saw Dr. Carlene Coria, plastic surgery to discuss potential skin graft/substitute placement. At this time he thinks that the skin graft would likely not take. He is in agreement with trying a wound VAC. Patient has been using Hydrofera Blue dressing changes with no issues. She denies signs of infection. She reports improvement in wound healing. 5/1; patient presents for follow-up. Unfortunately patient did not have insurance when we ran for the pico. There is an assistance program and we are trying to get this accommodated for the patient. In  the meantime she has been using Hydrofera Blue without any issues. She denies signs of infection. 5/8; patient presents for follow-up. We have not heard back if pico is covered by her insurance. She has been using collagen to the wound bed over the past week. She denies signs of infection. 5/18; patient presents for follow-up. She has been using collagen to the wound bed without issues. Again we have not heard if pico is covered by her insurance. She has no issues or complaints today. 5/23; patient presents for follow-up. She has been using collagen to the wound bed. She has no issues or complaints today. She obtained the wound VAC from [facility] and brought this in today. She denies signs of infection. 6/1; patient presents for follow-up. She has been using the wound VAC for the past week. She has had this changed twice since she was last here. She reports more maceration to the periwound. She denies signs of infection. 6/7; right TMA site. There are 2 wounds 0 separated by a bridge of healed tissue. The more lateral area has undermining. Both areas have healthy looking granulation at the base but relative the size of the wound is fairly deep. There is no exposed bone no evidence of infection. Her wound VAC was put on hold last week because of surrounding skin maceration she has been using collagen this week. She has a modified shoe 6/12; patient presents for follow-up. Last week the wound VAC was reinitiated. She had no issues with the wound VAC itself. Today she has maceration again noted to the surrounding skin. She denies signs of infection. 6/27; patient presents for follow-up. She has been using Medihoney to the wound bed. We took a break from the wound VAC because the periwound was macerated. She still has some areas of maceration to the distal foot where there is a callus. She currently denies signs of infection. 7/11; patient presents for follow-up. She has been using Medihoney to the wound bed.  She has developed some increased warmth and erythema to the lateral aspect of the right foot. She states this is occurred over the past week and there is increased pain. No drainage noted. 7/17; patient presents for follow-up. She has been using Medihoney to the wound bed. She completed her course of Bactrim. She reports improvement in  symptoms. 7/24; Patient presents for follow up. She has been using Medihoney to the wound bed without issues. She completed another course of Bactrim. She reports improvement in her symptoms but still has some mild tenderness to the medial aspect of the foot. 7/31; Patient presents for follow-up. She has been using Medihoney and Dakin's to the wound bed. She denies signs of infection. 8/14; patient presents for follow-up. She has been using Dakin's wet-to-dry packing strips to the right medial aspect of the amputation site and Medihoney to the anterior site. She has no issues or complaints today. She has started physical therapy. She denies signs of infection. Patient History Information obtained from Patient. Family History Cancer - Paternal Grandparents, Diabetes - Mother, Hypertension - Mother, Stroke - Maternal Grandparents, No family history of Heart Disease, Hereditary Spherocytosis, Kidney Disease, Lung Disease, Seizures, Thyroid Problems, Tuberculosis. Social History Never smoker, Marital Status - Single, Alcohol Use - Rarely, Drug Use - Prior History - Marijuana, Caffeine Use - Daily. Medical History Cardiovascular Patient has history of Hypertension Endocrine Patient has history of Type II Diabetes Musculoskeletal Patient has history of Osteomyelitis - Right Transmet 12/18/20 Neurologic Patient has history of Neuropathy Objective Constitutional respirations regular, non-labored and within target range for patient.. Vitals Time Taken: 3:54 PM, Height: 69 in, Temperature: 98.0 F, Pulse: 85 bpm, Respiratory Rate: 18 breaths/min, Blood Pressure:  115/78 mmHg, Capillary Blood Glucose: 142 mg/dl. Cardiovascular 2+ dorsalis pedis/posterior tibialis pulses. Psychiatric pleasant and cooperative. General Notes: Right foot: T the transmetatarsal amputation site there is wound dehiscence. She has 2 wounds present. Middle portion of the wound has o healed. The lateral aspect has a small open wound with Nonviable tissue. Post-debridement there is granulation tissue throughout.. The medial aspect has a tunnel. No signs of surrounding infection. Integumentary (Hair, Skin) Wound #2 status is Open. Original cause of wound was Surgical Injury. The date acquired was: 07/10/2021. The wound has been in treatment 6 weeks. The wound is located on the Right,Medial Amputation Site - Transmetatarsal. The wound measures 0.3cm length x 0.3cm width x 0.9cm depth; 0.071cm^2 area and 0.064cm^3 volume. There is Fat Layer (Subcutaneous Tissue) exposed. There is no undermining noted. There is a medium amount of serosanguineous drainage noted. The wound margin is distinct with the outline attached to the wound base. There is large (67-100%) red, pink granulation within the wound bed. There is a small (1-33%) amount of necrotic tissue within the wound bed including Adherent Slough. Wound #3 status is Open. Original cause of wound was Other Lesion. The date acquired was: 08/06/2021. The wound has been in treatment 3 weeks. The wound is located on the Right,Anterior Amputation Site - Transmetatarsal. The wound measures 0.3cm length x 0.5cm width x 0.2cm depth; 0.118cm^2 area and 0.024cm^3 volume. There is Fat Layer (Subcutaneous Tissue) exposed. There is no tunneling or undermining noted. There is a medium amount of serosanguineous drainage noted. The wound margin is distinct with the outline attached to the wound base. There is large (67-100%) red granulation within the wound bed. There is a small (1-33%) amount of necrotic tissue within the wound bed including Adherent  Slough. Assessment Active Problems ICD-10 Non-pressure chronic ulcer of other part of right foot with necrosis of bone Type 2 diabetes mellitus with foot ulcer Osteomyelitis, unspecified Patient's wounds have shown improvement in size and appearance since last clinic visit. I debrided nonviable tissue. I recommended continuing the course with Medihoney to the anterior site wound and Dakin's wet-to-dry packing strip to the tunnel. Follow-up  in 2 weeks. Procedures Wound #3 Pre-procedure diagnosis of Wound #3 is a Diabetic Wound/Ulcer of the Lower Extremity located on the Right,Anterior Amputation Site - Transmetatarsal .Severity of Tissue Pre Debridement is: Fat layer exposed. There was a Excisional Skin/Subcutaneous Tissue Debridement with a total area of 0.15 sq cm performed by Geralyn Corwin, DO. With the following instrument(s): Curette to remove Non-Viable tissue/material. Material removed includes Subcutaneous Tissue and Slough and after achieving pain control using Lidocaine 4% Topical Solution. No specimens were taken. A time out was conducted at 16:02, prior to the start of the procedure. A Minimum amount of bleeding was controlled with Pressure. The procedure was tolerated well. Post Debridement Measurements: 0.3cm length x 0.5cm width x 0.2cm depth; 0.024cm^3 volume. Character of Wound/Ulcer Post Debridement is stable. Severity of Tissue Post Debridement is: Fat layer exposed. Post procedure Diagnosis Wound #3: Same as Pre-Procedure Plan Follow-up Appointments: Return Appointment in 2 weeks. - Dr. Mikey Bussing and Lennox Laity, RN Room 7 09/10/2021 3:30pm Bathing/ Shower/ Hygiene: Other Bathing/Shower/Hygiene Orders/Instructions: - Clean with Saline or Dakins Edema Control - Lymphedema / SCD / Other: Elevate legs to the level of the heart or above for 30 minutes daily and/or when sitting, a frequency of: - throughout the day Avoid standing for long periods of time. Moisturize legs  daily. Off-Loading: Open toe surgical shoe to: - May use surgical shoe Additional Orders / Instructions: Follow Nutritious Diet - -Monitor/Control Blood Sugar -High Protein Diet WOUND #2: - Amputation Site - Transmetatarsal Wound Laterality: Right, Medial Cleanser: Normal Saline (Generic) 1 x Per Day/30 Days Discharge Instructions: Cleanse the wound with Normal Saline prior to applying a clean dressing using gauze sponges, not tissue or cotton balls. Prim Dressing: Plain packing strip 1/4 (in) 1 x Per Day/30 Days ary Discharge Instructions: Lightly pack as instructed Prim Dressing: Dakin's Solution 0.25%, 16 (oz) 1 x Per Day/30 Days ary Discharge Instructions: Moisten packing strip with Dakin's solution Secondary Dressing: ABD Pad, 5x9 1 x Per Day/30 Days Discharge Instructions: Apply over primary dressing as directed. Secondary Dressing: Woven Gauze Sponge, Non-Sterile 4x4 in 1 x Per Day/30 Days Discharge Instructions: Apply over primary dressing as directed. Secured With: Elastic Bandage 4 inch (ACE bandage) (Generic) 1 x Per Day/30 Days Discharge Instructions: Secure with ACE bandage as directed. Secured With: [employer], 4.5x3.1 (in/yd) (Generic) 1 x Per Day/30 Days Discharge Instructions: Secure with Kerlix as directed. WOUND #3: - Amputation Site - Transmetatarsal Wound Laterality: Right, Anterior Cleanser: Normal Saline (Generic) 1 x Per Day/30 Days Discharge Instructions: Cleanse the wound with Normal Saline prior to applying a clean dressing using gauze sponges, not tissue or cotton balls. Prim Dressing: MediHoney Gel, tube 1.5 (oz) 1 x Per Day/30 Days ary Discharge Instructions: Apply to wound bed as instructed Secondary Dressing: ABD Pad, 5x9 1 x Per Day/30 Days Discharge Instructions: Apply over primary dressing as directed. Secondary Dressing: Woven Gauze Sponge, Non-Sterile 4x4 in 1 x Per Day/30 Days Discharge Instructions: Apply over primary dressing as  directed. Secured With: Elastic Bandage 4 inch (ACE bandage) (Generic) 1 x Per Day/30 Days Discharge Instructions: Secure with ACE bandage as directed. Secured With: [employer], 4.5x3.1 (in/yd) (Generic) 1 x Per Day/30 Days Discharge Instructions: Secure with Kerlix as directed. 1. In office sharp debridement 2. Dakin's wet-to-dry packing strip 3. Medihoney 4. Follow-up in 2 weeks 5. Offloadingoosurgical shoe Electronic Signature(s) Signed: 08/27/2021 4:17:44 PM By: Geralyn Corwin DO Entered By: Geralyn Corwin on 08/27/2021 16:16:50 -------------------------------------------------------------------------------- HxROS Details Patient Name: Date  of Service: [name], [name] 08/27/2021 3:30 PM Medical Record Number: [id number] Patient Account Number: [id number] Date of Birth/Sex: Treating RN: 26-Jul-1980 (41 y.o. F) Primary Care Provider: Gwinda Passe Other Clinician: Referring Provider: Treating Provider/Extender: Grace Isaac in Treatment: 31 Information Obtained From Patient Cardiovascular Medical History: Positive for: Hypertension Endocrine Medical History: Positive for: Type II Diabetes Time with diabetes: Dx 2009 Treated with: Insulin, Oral agents Blood sugar tested every day: Yes Tested : daily Musculoskeletal Medical History: Positive for: Osteomyelitis - Right Transmet 12/18/20 Neurologic Medical History: Positive for: Neuropathy Immunizations Pneumococcal Vaccine: Received Pneumococcal Vaccination: No Implantable Devices Yes Family and Social History Cancer: Yes - Paternal Grandparents; Diabetes: Yes - Mother; Heart Disease: No; Hereditary Spherocytosis: No; Hypertension: Yes - Mother; Kidney Disease: No; Lung Disease: No; Seizures: No; Stroke: Yes - Maternal Grandparents; Thyroid Problems: No; Tuberculosis: No; Never smoker; Marital Status - Single; Alcohol Use: Rarely; Drug Use: Prior History - Marijuana;  Caffeine Use: Daily; Financial Concerns: No; Food, Clothing or Shelter Needs: No; Support System Lacking: No; Transportation Concerns: No Electronic Signature(s) Signed: 08/27/2021 4:17:44 PM By: Geralyn Corwin DO Entered By: Geralyn Corwin on 08/27/2021 16:13:08 -------------------------------------------------------------------------------- SuperBill Details Patient Name: Date of Service: [name], [name] 08/27/2021 Medical Record Number: [id number] Patient Account Number: [id number] Date of Birth/Sex: Treating RN: 04-05-1980 (41 y.o. [name] Primary Care Provider: Gwinda Passe Other Clinician: Referring Provider: Treating Provider/Extender: Grace Isaac in Treatment: 31 Diagnosis Coding ICD-10 Codes Code Description [phone] Non-pressure chronic ulcer of other part of right foot with necrosis of bone E11.621 Type 2 diabetes mellitus with foot ulcer M86.9 Osteomyelitis, unspecified Facility Procedures Physician Procedures : CPT4 Code Description Modifier [id number] - WC PHYS SUBQ TISS 20 SQ CM ICD-10 Diagnosis Description L97.514 Non-pressure chronic ulcer of other part of right foot with necrosis of bone Quantity: 1 Electronic Signature(s) Signed: 08/27/2021 4:17:44 PM By: Geralyn Corwin DO Entered By: Geralyn Corwin on 08/27/2021 16:17:29

## 2021-09-05 ENCOUNTER — Telehealth (INDEPENDENT_AMBULATORY_CARE_PROVIDER_SITE_OTHER): Payer: Self-pay | Admitting: Primary Care

## 2021-09-05 NOTE — Telephone Encounter (Signed)
Copied from CRM #[id number]. Topic: Appointment Scheduling - Scheduling Inquiry for Clinic >> Sep 05, 2021 12:26 PM Tiffany B wrote: Reason for CRM: patient would like to [facility] her 09/10/2021 appointment to preferable Tuesday 09/11/2021 at 2 pm due to a conflict

## 2021-09-10 ENCOUNTER — Encounter (HOSPITAL_BASED_OUTPATIENT_CLINIC_OR_DEPARTMENT_OTHER): Payer: Medicaid Other | Admitting: Internal Medicine

## 2021-09-10 ENCOUNTER — Other Ambulatory Visit: Payer: Self-pay

## 2021-09-10 ENCOUNTER — Ambulatory Visit: Payer: Medicaid Other | Admitting: Pharmacist

## 2021-09-11 ENCOUNTER — Other Ambulatory Visit: Payer: Self-pay

## 2021-09-11 ENCOUNTER — Encounter (HOSPITAL_BASED_OUTPATIENT_CLINIC_OR_DEPARTMENT_OTHER): Payer: Medicaid Other | Admitting: Internal Medicine

## 2021-09-11 DIAGNOSIS — E11621 Type 2 diabetes mellitus with foot ulcer: Secondary | ICD-10-CM | POA: Diagnosis not present

## 2021-09-11 DIAGNOSIS — M869 Osteomyelitis, unspecified: Secondary | ICD-10-CM | POA: Diagnosis not present

## 2021-09-11 DIAGNOSIS — L97514 Non-pressure chronic ulcer of other part of right foot with necrosis of bone: Secondary | ICD-10-CM | POA: Diagnosis not present

## 2021-09-11 MED ORDER — SULFAMETHOXAZOLE-TRIMETHOPRIM 800-160 MG PO TABS
1.0000 | ORAL_TABLET | Freq: Two times a day (BID) | ORAL | 0 refills | Status: DC
  Filled 2021-09-11: qty 14, 7d supply, fill #0

## 2021-09-11 NOTE — Progress Notes (Signed)
Stiner, Shandora L. ([id number]) Visit Report for 09/11/2021 Arrival Information Details Patient Name: Date of Service: Pennie Rushing, [name] [name] 09/11/2021 3:30 PM Medical Record Number: [id number] Patient Account Number: [id number] Date of Birth/Sex: Treating RN: 12/06/1980 (41 y.o. [name] Primary Care Meredeth Furber: Juluis Mire Other Clinician: Referring Osher Oettinger: Treating Kyal Arts/Extender: Edmonia Lynch in Treatment: 33 Visit Information History Since Last Visit Added or deleted any medications: No Patient Arrived: Gilford Rile Any new allergies or adverse reactions: No Arrival Time: 15:35 Had a fall or experienced change in No Transfer Assistance: None activities of daily living that may affect Patient Identification Verified: Yes risk of falls: Secondary Verification Process Completed: Yes Signs or symptoms of abuse/neglect since last visito No Patient Requires Transmission-Based No Hospitalized since last visit: No Precautions: Implantable device outside of the clinic excluding No Patient Has Alerts: Yes cellular tissue based products placed in the center Patient Alerts: Patient on Blood Thinner since last visit: PICC R Arm Has Dressing in Place as Prescribed: Yes ABI 12/17/20 R=1.08 Pain Present Now: Yes L=1.13 Electronic Signature(s) Signed: 09/11/2021 4:34:50 PM By: Lorrin Jackson Entered By: Lorrin Jackson on 09/11/2021 15:38:09 -------------------------------------------------------------------------------- Clinic Level of Care Assessment Details Patient Name: Date of Service: [name], [name] 09/11/2021 3:30 PM Medical Record Number: [id number] Patient Account Number: [id number] Date of Birth/Sex: Treating RN: 23-Jan-1980 (41 y.o. [name] Primary Care Rashi Giuliani: Juluis Mire Other Clinician: Referring Shada Nienaber: Treating Cedric Denison/Extender: Edmonia Lynch in Treatment: 33 Clinic Level of Care  Assessment Items TOOL 4 Quantity Score X- 1 0 Use when only an EandM is performed on FOLLOW-UP visit ASSESSMENTS - Nursing Assessment / Reassessment X- 1 10 Reassessment of Co-morbidities (includes updates in patient status) X- 1 5 Reassessment of Adherence to Treatment Plan ASSESSMENTS - Wound and Skin A ssessment / Reassessment X - Simple Wound Assessment / Reassessment - one wound 1 5 []  - 0 Complex Wound Assessment / Reassessment - multiple wounds []  - 0 Dermatologic / Skin Assessment (not related to wound area) ASSESSMENTS - Focused Assessment []  - 0 Circumferential Edema Measurements - multi extremities []  - 0 Nutritional Assessment / Counseling / Intervention []  - 0 Lower Extremity Assessment (monofilament, tuning fork, pulses) []  - 0 Peripheral Arterial Disease Assessment (using hand held doppler) ASSESSMENTS - Ostomy and/or Continence Assessment and Care []  - 0 Incontinence Assessment and Management []  - 0 Ostomy Care Assessment and Management (repouching, etc.) PROCESS - Coordination of Care []  - 0 Simple Patient / Family Education for ongoing care X- 1 20 Complex (extensive) Patient / Family Education for ongoing care []  - 0 Staff obtains Programmer, systems, Records, T Results / Process Orders est []  - 0 Staff telephones HHA, Nursing Homes / Clarify orders / etc []  - 0 Routine Transfer to another Facility (non-emergent condition) []  - 0 Routine Hospital Admission (non-emergent condition) []  - 0 New Admissions / Biomedical engineer / Ordering NPWT Apligraf, etc. , []  - 0 Emergency Hospital Admission (emergent condition) []  - 0 Simple Discharge Coordination []  - 0 Complex (extensive) Discharge Coordination PROCESS - Special Needs []  - 0 Pediatric / Minor Patient Management []  - 0 Isolation Patient Management []  - 0 Hearing / Language / Visual special needs []  - 0 Assessment of Community assistance (transportation, D/C planning, etc.) []  - 0 Additional  assistance / Altered mentation []  - 0 Support Surface(s) Assessment (bed, cushion, seat, etc.) INTERVENTIONS - Wound Cleansing / Measurement X - Simple Wound Cleansing - one wound  1 5 $R'[]'Ra$  - 0 Complex Wound Cleansing - multiple wounds X- 1 5 Wound Imaging (photographs - any number of wounds) $RemoveBe'[]'wCpmSPORy$  - 0 Wound Tracing (instead of photographs) X- 1 5 Simple Wound Measurement - one wound $RemoveB'[]'jtPYwjOs$  - 0 Complex Wound Measurement - multiple wounds INTERVENTIONS - Wound Dressings $RemoveBeforeD'[]'ivyMXhwtApIAsU$  - 0 Small Wound Dressing one or multiple wounds X- 1 15 Medium Wound Dressing one or multiple wounds $RemoveBeforeD'[]'aSNYKyMCFhwQmd$  - 0 Large Wound Dressing one or multiple wounds $RemoveBeforeD'[]'BcYVvXThhJlaAU$  - 0 Application of Medications - topical $RemoveB'[]'fhSyfGxP$  - 0 Application of Medications - injection INTERVENTIONS - Miscellaneous $RemoveBeforeD'[]'YunTnQfdxYtteV$  - 0 External ear exam $Remove'[]'KVbwwjv$  - 0 Specimen Collection (cultures, biopsies, blood, body fluids, etc.) $RemoveBefor'[]'hNvgkTgwLEoh$  - 0 Specimen(s) / Culture(s) sent or taken to Lab for analysis $RemoveBefo'[]'qqRsaYkztvy$  - 0 Patient Transfer (multiple staff / Civil Service fast streamer / Similar devices) $RemoveBeforeDE'[]'cSrGxhIhjguaBQV$  - 0 Simple Staple / Suture removal (25 or less) $Remove'[]'HVJgKLz$  - 0 Complex Staple / Suture removal (26 or more) $Remove'[]'MSwBOHb$  - 0 Hypo / Hyperglycemic Management (close monitor of Blood Glucose) $RemoveBefore'[]'OtOZVThqFhfES$  - 0 Ankle / Brachial Index (ABI) - do not check if billed separately X- 1 5 Vital Signs Has the patient been seen at the hospital within the last three years: Yes Total Score: 75 Level Of Care: New/Established - Level 2 Electronic Signature(s) Signed: 09/11/2021 4:34:50 PM By: Lorrin Jackson Entered By: Lorrin Jackson on 09/11/2021 16:04:43 -------------------------------------------------------------------------------- Encounter Discharge Information Details Patient Name: Date of Service: [name], [name] 09/11/2021 3:30 PM Medical Record Number: [id number] Patient Account Number: [id number] Date of Birth/Sex: Treating RN: [birth year] (41 y.o. [name] Primary Care Lashawn Orrego: Juluis Mire Other Clinician: Referring  Tedd Cottrill: Treating Kadey Mihalic/Extender: Edmonia Lynch in Treatment: 33 Encounter Discharge Information Items Discharge Condition: Stable Ambulatory Status: Walker Discharge Destination: Home Transportation: Private Auto Schedule Follow-up Appointment: Yes Clinical Summary of Care: Provided on 09/11/2021 Form Type Recipient Paper Patient Patient Electronic Signature(s) Signed: 09/11/2021 4:34:50 PM By: Lorrin Jackson Entered By: Lorrin Jackson on 09/11/2021 16:17:05 -------------------------------------------------------------------------------- Lower Extremity Assessment Details Patient Name: Date of Service: [name], [name] 09/11/2021 3:30 PM Medical Record Number: [id number] Patient Account Number: [id number] Date of Birth/Sex: Treating RN: October 11, 1980 (41 y.o. [name] Primary Care Arleigh Dicola: Juluis Mire Other Clinician: Referring Leveon Pelzer: Treating Sherrick Araki/Extender: Geryl Councilman Weeks in Treatment: 33 Edema Assessment Assessed: [Left: No] Patrice Paradise: Yes] Edema: [Left: Ye] [Right: s] Calf Left: Right: Point of Measurement: 34 cm From Medial Instep 55 cm Ankle Left: Right: Point of Measurement: 8 cm From Medial Instep 27.2 cm Vascular Assessment Pulses: Dorsalis Pedis Palpable: [Right:Yes] Electronic Signature(s) Signed: 09/11/2021 4:34:50 PM By: Lorrin Jackson Entered By: Lorrin Jackson on 09/11/2021 15:41:21 -------------------------------------------------------------------------------- Multi Wound Chart Details Patient Name: Date of Service: [name], [name] 09/11/2021 3:30 PM Medical Record Number: [id number] Patient Account Number: [id number] Date of Birth/Sex: Treating RN: September 18, 1980 (41 y.o. 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: Extremity Hypertension, Type II Diabetes, Hypertension, Type II Diabetes, N/A Comorbid History: Osteomyelitis, Neuropathy Osteomyelitis, Neuropathy 07/10/2021 08/06/2021 N/A Date Acquired: 9 5 N/A Weeks  of Treatment: Open Healed - Epithelialized N/A Wound Status: No No N/A Wound Recurrence: 0.2x0.3x3 0x0x0 N/A Measurements L x W x D (cm) 0.047 0 N/A A (cm) : rea 0.141 0 N/A Volume (cm) : 60.20% 100.00% N/A % Reduction in Area: -302.90% 100.00% N/A % Reduction in Volume: Full Thickness Without Exposed Grade 1 N/A Classification: Support Structures Medium N/A N/A Exudate Amount: Serosanguineous N/A N/A Exudate Type: red, brown N/A N/A Exudate Color: Distinct, outline attached N/A N/A Wound Margin: Large (67-100%) N/A N/A Granulation Amount: Red, Pink N/A N/A Granulation Quality: Small (1-33%) N/A N/A Necrotic Amount: Fat Layer (Subcutaneous Tissue): Yes N/A N/A Exposed Structures: Fascia: No Tendon: No Muscle: No Joint: No Bone: No Medium (34-66%) N/A N/A Epithelialization: Treatment Notes Electronic Signature(s) Signed: 09/11/2021 4:26:57 PM By: Kalman Shan DO Entered By: Kalman Shan on 09/11/2021 16:06:23 -------------------------------------------------------------------------------- Multi-Disciplinary Care Plan Details Patient Name: Date of Service: [name], [name] 09/11/2021 3:30 PM Medical Record Number: [id number] Patient Account Number: [id number] Date of Birth/Sex: Treating RN: 14-Jun-1980 (41 y.o. [name] Primary Care Shyteria Lewis: Juluis Mire Other Clinician: Referring Alonzo Owczarzak: Treating Ronda Rajkumar/Extender: Edmonia Lynch in Treatment: 33 Active Inactive Nutrition Nursing Diagnoses: Impaired glucose control: actual or potential Goals: Patient/caregiver verbalizes understanding of need to maintain therapeutic glucose control per primary care physician Date Initiated: 01/22/2021 Target Resolution Date: 10/09/2021 Goal Status: Active Interventions: Assess HgA1c results as ordered upon admission and as needed Provide education on elevated blood sugars and impact on wound healing Treatment Activities: Obtain HgA1c : 01/22/2021 Notes: 07/10/21: Glucose control ongoing 09/11/21 : Glucose control ongoing, patient not compliant in checking glucose. Wound/Skin Impairment Nursing Diagnoses: Impaired tissue integrity Goals: Patient/caregiver will verbalize understanding of skin care regimen Date Initiated: 01/22/2021 Target Resolution Date: 10/09/2021 Goal Status: Active Ulcer/skin breakdown will have a volume reduction of 30% by week 4 Date Initiated: 01/22/2021 Date Inactivated: 03/19/2021 Target Resolution Date: 03/16/2021 Goal Status: Met Ulcer/skin breakdown will have a volume reduction of 50% by week 8 Date Initiated: 03/19/2021 Date Inactivated: 05/14/2021 Target Resolution Date: 04/16/2021 Unmet Reason: see wound Goal Status: Unmet measurements Interventions: Assess patient/caregiver ability to obtain necessary supplies Assess patient/caregiver ability to perform ulcer/skin care regimen upon admission and as needed Assess ulceration(s) every visit Provide education on ulcer and skin care Treatment Activities: Topical wound management initiated : 01/22/2021 Notes: 06/04/21: Wound vac started 07/10/21: Wound care regimen continues Electronic Signature(s) Signed: 09/11/2021 4:34:50 PM By: Lorrin Jackson Entered By: Lorrin Jackson on 09/11/2021  16:03:32 -------------------------------------------------------------------------------- Pain Assessment Details Patient Name: Date of Service: DA [name], [name] 09/11/2021 3:30 PM Medical Record Number: [id number] Patient Account Number: [id number] Date of Birth/Sex: Treating RN: 07/15/1980 (41 y.o. [name] Primary Care Adlene Adduci: Juluis Mire Other Clinician: Referring Xavier Munger: Treating Linas Stepter/Extender: Edmonia Lynch in Treatment: 33 Active Problems Location of Pain Severity and Description of Pain Patient Has Paino Yes Site Locations Pain Location: Pain in Ulcers With Dressing Change: Yes Duration of the Pain. Constant / Intermittento Intermittent Rate the pain. Current Pain Level: 3 Character of Pain Describe the Pain: Aching, Shooting, Tender Pain Management and Medication Current Pain Management: Medication: Yes Cold Application: No Rest: Yes Massage: No Activity: No T.E.N.S.: No Heat Application: No Leg drop or elevation: No Is the Current Pain Management Adequate: Adequate How does your wound impact your activities of daily livingo Sleep: No Bathing: No Appetite: No Relationship With Others: No Bladder Continence: No Emotions: No Bowel Continence: No Work:  No Toileting: No Drive: No Dressing: No Hobbies: No Electronic Signature(s) Signed: 09/11/2021 4:34:50 PM By: Lorrin Jackson Entered By: Lorrin Jackson on 09/11/2021 15:41:05 -------------------------------------------------------------------------------- Patient/Caregiver Education Details Patient Name: Date of Service: DA [name], [place] RKIA Carlean Jews 8/29/2023andnbsp3:30 PM Medical Record Number: [id number] Patient Account Number: [id number] Date of Birth/Gender: Treating RN: 1980/06/21 (41 y.o. [name] Primary Care Physician: Juluis Mire Other Clinician: Referring Physician: Treating Physician/Extender: Edmonia Lynch in Treatment: 83 Education Assessment Education Provided To: Patient Education Topics Provided Elevated Blood Sugar/ Impact on Healing: Methods: Explain/Verbal, Printed Responses: State content correctly Offloading: Methods: Explain/Verbal, Printed Responses: State content correctly Wound/Skin Impairment: Methods: Demonstration, Explain/Verbal, Printed Responses: State content correctly Electronic Signature(s) Signed: 09/11/2021 4:34:50 PM By: Lorrin Jackson Entered By: Lorrin Jackson on 09/11/2021 16:04:04 -------------------------------------------------------------------------------- Wound Assessment Details Patient Name: Date of Service: [name], [name] 09/11/2021 3:30 PM Medical Record Number: [id number] Patient Account Number: [id number] Date of Birth/Sex: Treating RN: [birth year] (41 y.o. [name] Primary Care Madyson Lukach: Juluis Mire Other Clinician: Referring Muadh Creasy: Treating Geoge Lawrance/Extender: Edmonia Lynch in Treatment: 33 Wound Status Wound Number: 2 Primary Etiology: Dehisced Wound Wound Location: Right, Medial Amputation Site - Transmetatarsal Wound Status: Open Wounding Event: Surgical Injury Comorbid Hypertension, Type II Diabetes, Osteomyelitis, History: Neuropathy Date Acquired: 07/10/2021 Weeks Of Treatment: 9 Clustered Wound: No Photos Wound Measurements Length: (cm) 0.2 Width: (cm) 0.3 Depth: (cm) 3 Area: (cm) 0.047 Volume: (cm) 0.141 % Reduction in Area: 60.2% % Reduction in Volume: -302.9% Epithelialization: Medium (34-66%) Tunneling: No Undermining: No Wound Description Classification: Full Thickness Without Exposed Support Structures Wound Margin: Distinct, outline attached Exudate Amount: Medium Exudate Type: Serosanguineous Exudate Color: red, brown Foul Odor After Cleansing: No Slough/Fibrino Yes Wound Bed Granulation Amount: Large (67-100%) Exposed  Structure Granulation Quality: Red, Pink Fascia Exposed: No Necrotic Amount: Small (1-33%) Fat Layer (Subcutaneous Tissue) Exposed: Yes Necrotic Quality: Adherent Slough Tendon Exposed: No Muscle Exposed: No Joint Exposed: No Bone Exposed: No Treatment Notes Wound #2 (Amputation Site - Transmetatarsal) Wound Laterality: Right, Medial Cleanser Normal Saline Discharge Instruction: Cleanse the wound with Normal Saline prior to applying a clean dressing using gauze sponges, not tissue or cotton balls. Peri-Wound Care Topical Primary Dressing KerraCel Ag Gelling Fiber Dressing, 2x2 in (silver alginate) Discharge Instruction: Apply silver alginate to wound bed as instructed Secondary Dressing Woven Gauze Sponge, Non-Sterile 4x4 in Discharge Instruction: Apply over primary dressing as directed. Secured With Elastic Bandage 4 inch (ACE bandage) Discharge Instruction: Secure with ACE bandage as directed. Kerlix Roll Sterile, 4.5x3.1 (in/yd) Discharge Instruction: Secure with Kerlix as directed. Compression Wrap Compression Stockings Add-Ons Electronic Signature(s) Signed: 09/11/2021 4:34:50 PM By: Lorrin Jackson Entered By: Lorrin Jackson on 09/11/2021 15:49:05 -------------------------------------------------------------------------------- Wound Assessment Details Patient Name: Date of Service: DA [name] IS, [name] 09/11/2021 3:30 PM Medical Record Number: [id number] Patient Account Number: [id number] Date of Birth/Sex: Treating RN: 1980/06/20 (41 y.o. 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, Anterior Amputation Site - Transmetatarsal Wound Status: Healed - Epithelialized Wounding Event: Other Lesion Comorbid Hypertension,  Type II Diabetes, Osteomyelitis, History: Neuropathy Date Acquired: 08/06/2021 Weeks Of Treatment: 5 Clustered Wound: No Photos Wound Measurements Length: (cm) Width: (cm) Depth: (cm) Area: (cm) Volume: (cm) 0 % Reduction in Area: 100% 0 % Reduction in Volume: 100% 0  0 0 Wound Description Classification: Grade 1 Electronic Signature(s) Signed: 09/11/2021 4:34:50 PM By: Lorrin Jackson Entered By: Lorrin Jackson on 09/11/2021 15:54:15 -------------------------------------------------------------------------------- Vitals Details Patient Name: Date of Service: [name], [name] 09/11/2021 3:30 PM Medical Record Number: [id number] Patient Account Number: [id number] Date of Birth/Sex: Treating RN: 07/27/1980 (41 y.o. [name] Primary Care Lilley Hubble: Juluis Mire Other Clinician: Referring Mei Suits: Treating Amelda Hapke/Extender: Edmonia Lynch in Treatment: 33 Vital Signs Time Taken: 15:38 Temperature (F): 98.2 Height (in): 69 Pulse (bpm): 83 Respiratory Rate (breaths/min): 18 Blood Pressure (mmHg): 130/81 Capillary Blood Glucose (mg/dl): 129 Reference Range: 80 - 120 mg / dl Electronic Signature(s) Signed: 09/11/2021 4:34:50 PM By: Lorrin Jackson Entered By: Lorrin Jackson on 09/11/2021 15:40:17

## 2021-09-11 NOTE — Progress Notes (Signed)
Manges, Martin L. ([id number]) Visit Report for 09/11/2021 Chief Complaint Document Details Patient Name: Date of Service: [name] IS, [place] [name] 09/11/2021 3:30 PM Medical Record Number: [id number] Patient Account Number: [id number] Date of Birth/Sex: Treating RN: March 10, 1980 (41 y.o. F) Primary Care Provider: Gwinda Passe Other Clinician: Referring Provider: Treating Provider/Extender: Grace Isaac in Treatment: 33 Information Obtained from: Patient Chief Complaint Osteomyelitis of the right foot status post transmetatarsal amputation with surgical site dehiscence Electronic Signature(s) Signed: 09/11/2021 4:26:57 PM By: Geralyn Corwin DO Entered By: Geralyn Corwin on 09/11/2021 16:06:33 -------------------------------------------------------------------------------- HPI Details Patient Name: Date of Service: [name], [name] 09/11/2021 3:30 PM Medical Record Number: [id number] Patient Account Number: [id number] Date of Birth/Sex: Treating RN: [birth year] (41 y.o. F) Primary Care Provider: Gwinda Passe Other Clinician: Referring Provider: Treating Provider/Extender: Grace Isaac in Treatment: 58 History of Present Illness HPI Description: Admission 01/22/2021 [name] is a 41 year old female with a past medical history of insulin-dependent uncontrolled type 2 diabetes with last hemoglobin A1c of 13.5, osteomyelitis of the right foot status post transmetatarsal amputation on 12/18/2020 that presents to the clinic for right foot wound. She has had dehiscence of the surgical site. She is currently using wet-to-dry dressings. She has a PICC line and receiving IV ceftriaxone daily for her osteomyelitis. There is an end date of 01/27/2021. She is also taking oral metronidazole. She currently denies systemic signs of infection. 1/19; patient presents for follow-up. She was diagnosed with a DVT to the right lower extremity 2  days ago. She is on Eliquis now. She is scheduled to see her infectious disease doctor tomorrow. She has been using Dakin's wet-to-dry dressings. She denies systemic signs of infection. 1/26; patient presents for follow-up. She saw infectious disease on 1/21 started on Augmentin. Her PICC line and IV ceftriaxone was discontinued. Patient reports stability to her wound. She has been using Dakin's wet-to-dry dressings. She currently denies systemic signs of infection. 2/3; patient presents for follow-up. She continues to use Dakin's wet-to-dry dressings to the wound bed. She saw Dr. Manson Passey with infectious disease yesterday and is continuing Augmentin. T entative end date is 2/16. Patient reports following up with orthopedics. She states there is no further plan from them. She currently denies systemic signs of infection. 2/10; patient presents for follow-up. She continues to use Dakin's wet to dry dressings. She is scheduled to have her MRI done on 2/14. She states that she had pain to the debridement site from last clinic visit and declines debridement today. She denies systemic signs of infection. She continues to have yellow thick drainage. 2/20; patient presents for follow-up. She continues to use Dakin's wet-to-dry dressings. She obtained her MRI. The results showed an abscess and she is scheduled to see her orthopedic surgeon on 2/23. She saw infectious disease 2/17 and her antibiotics were extended. She currently denies systemic signs of infection. 3/6; patient presents for follow-up. She had debridement and irrigation of her foot on 03/10/2021 due to abscess noted on MRI. She was started on IV ceftriaxone and oral Flagyl. She has no issues or complaints today. She has been using iodoform packing to the tunnel and Dakin's wet-to-dry to the opening. 03/26/2021: She continues on IV ceftriaxone and oral metronidazole. She has follow-up with infectious disease tomorrow. No significant issues or  complaints today. Her mother continues to help her with her wound dressing, using iodoform packing strips into the tunnel and Dakin's to the open portion of the  wound. 3/20; patient presents for follow-up. She continues to be on IV ceftriaxone in oral metronidazole. She has been using iodoform to the tunnel and Dakin's wet-to- dry to the open wound. She denies signs of infection. 3/27; patient presents for follow-up. She no longer has a PICC line. She has been using iodoform to the tunnel and Dakin's wet-to-dry to the open wound. She reports improvement in wound healing. She denies signs of infection. 4/3; patient presents for follow-up. She states she has been using Hydrofera Blue to the open wound and iodoform packing to the tunnel without any issues. She denies signs of infection. 4/18; patient presents for follow-up. She saw infectious disease on 4/11. She has finished her oral antibiotics and completed a total of 6 weeks of antibiotics (this includes IV as well). No further antibiotics needed. She has been using Hydrofera Blue and iodoform packing. She states that the tunneled area has come in and the iodoform is not staying in place anymore. She has no issues or complaints today. She denies signs of infection. 4/24; patient presents for follow-up. She saw Dr. Despina Pole, plastic surgery to discuss potential skin graft/substitute placement. At this time he thinks that the skin graft would likely not take. He is in agreement with trying a wound VAC. Patient has been using Hydrofera Blue dressing changes with no issues. She denies signs of infection. She reports improvement in wound healing. 5/1; patient presents for follow-up. Unfortunately patient did not have insurance when we ran for the pico. There is an assistance program and we are trying to get this accommodated for the patient. In the meantime she has been using Hydrofera Blue without any issues. She denies signs of infection. 5/8; patient  presents for follow-up. We have not heard back if pico is covered by her insurance. She has been using collagen to the wound bed over the past week. She denies signs of infection. 5/18; patient presents for follow-up. She has been using collagen to the wound bed without issues. Again we have not heard if pico is covered by her insurance. She has no issues or complaints today. 5/23; patient presents for follow-up. She has been using collagen to the wound bed. She has no issues or complaints today. She obtained the wound VAC from [facility] and brought this in today. She denies signs of infection. 6/1; patient presents for follow-up. She has been using the wound VAC for the past week. She has had this changed twice since she was last here. She reports more maceration to the periwound. She denies signs of infection. 6/7; right TMA site. There are 2 wounds 0 separated by a bridge of healed tissue. The more lateral area has undermining. Both areas have healthy looking granulation at the base but relative the size of the wound is fairly deep. There is no exposed bone no evidence of infection. Her wound VAC was put on hold last week because of surrounding skin maceration she has been using collagen this week. She has a modified shoe 6/12; patient presents for follow-up. Last week the wound VAC was reinitiated. She had no issues with the wound VAC itself. Today she has maceration again noted to the surrounding skin. She denies signs of infection. 6/27; patient presents for follow-up. She has been using Medihoney to the wound bed. We took a break from the wound VAC because the periwound was macerated. She still has some areas of maceration to the distal foot where there is a callus. She currently denies signs of infection.  7/11; patient presents for follow-up. She has been using Medihoney to the wound bed. She has developed some increased warmth and erythema to the lateral aspect of the right foot. She states this  is occurred over the past week and there is increased pain. No drainage noted. 7/17; patient presents for follow-up. She has been using Medihoney to the wound bed. She completed her course of Bactrim. She reports improvement in symptoms. 7/24; Patient presents for follow up. She has been using Medihoney to the wound bed without issues. She completed another course of Bactrim. She reports improvement in her symptoms but still has some mild tenderness to the medial aspect of the foot. 7/31; Patient presents for follow-up. She has been using Medihoney and Dakin's to the wound bed. She denies signs of infection. 8/14; patient presents for follow-up. She has been using Dakin's wet-to-dry packing strips to the right medial aspect of the amputation site and Medihoney to the anterior site. She has no issues or complaints today. She has started physical therapy. She denies signs of infection. 8/29; patient presents for follow-up. She has been using Dakin's wet-to-dry packing strips to the right medial aspect of the amputation site however this is becoming more difficult to place. She did report that she had increased redness and swelling to that site and developed some drainage. It has resolved. She continues with physical therapy. Electronic Signature(s) Signed: 09/11/2021 4:26:57 PM By: Geralyn Corwin DO Entered By: Geralyn Corwin on 09/11/2021 16:15:18 -------------------------------------------------------------------------------- Physical Exam Details Patient Name: Date of Service: [name], [name] 09/11/2021 3:30 PM Medical Record Number: [id number] Patient Account Number: [id number] Date of Birth/Sex: Treating RN: [birth year] (41 y.o. F) Primary Care Provider: Gwinda Passe Other Clinician: Referring Provider: Treating Provider/Extender: Grace Isaac in Treatment: 33 Constitutional respirations regular, non-labored and within target range for  patient.. Cardiovascular 2+ dorsalis pedis/posterior tibialis pulses. Psychiatric pleasant and cooperative. Notes Right foot: T the transmetatarsal amputation site there is callus to the entire section except for the medial aspect where there is tunneling. No increased o warmth, erythema or purulent drainage noted. Electronic Signature(s) Signed: 09/11/2021 4:26:57 PM By: Geralyn Corwin DO Entered By: Geralyn Corwin on 09/11/2021 16:15:52 -------------------------------------------------------------------------------- Physician Orders Details Patient Name: Date of Service: [name], [name] 09/11/2021 3:30 PM Medical Record Number: [id number] Patient Account Number: [id number] Date of Birth/Sex: Treating RN: 04-Jul-1980 (41 y.o. [name] Primary Care Provider: Gwinda Passe Other Clinician: Referring Provider: Treating Provider/Extender: Grace Isaac in Treatment: 36 Verbal / Phone Orders: No Diagnosis Coding Follow-up Appointments ppointment in 1 week. - 09/20/21 @ 8:45am with Dr. Mikey Bussing and Lennox Laity, RN (Room 7) Return A Other: - Prescription sent to pharmacy, take as directed. Anesthetic (In clinic) Topical Lidocaine 5% applied to wound bed (In clinic) Topical Lidocaine 4% applied to wound bed Bathing/ Shower/ Hygiene Other Bathing/Shower/Hygiene Orders/Instructions: - Clean with Saline or Dakins Edema Control - Lymphedema / SCD / Other Elevate legs to the level of the heart or above for 30 minutes daily and/or when sitting, a frequency of: - throughout the day void standing for long periods of time. - Limit time/pressure on feet, reduce PT Use wheelchair instead of walker when you can. . A Moisturize legs daily. Off-Loading Open toe surgical shoe to: - May use surgical shoe Additional Orders / Instructions Follow Nutritious Diet - -Monitor/Control Blood Sugar -High Protein Diet Wound Treatment Wound #2 - Amputation Site -  Transmetatarsal Wound Laterality: Right, Medial Cleanser:  Normal Saline (Generic) 1 x Per Day/30 Days Discharge Instructions: Cleanse the wound with Normal Saline prior to applying a clean dressing using gauze sponges, not tissue or cotton balls. Prim Dressing: KerraCel Ag Gelling Fiber Dressing, 2x2 in (silver alginate) 1 x Per Day/30 Days ary Discharge Instructions: Apply silver alginate to wound bed as instructed Secondary Dressing: Woven Gauze Sponge, Non-Sterile 4x4 in 1 x Per Day/30 Days Discharge Instructions: Apply over primary dressing as directed. Secured With: Elastic Bandage 4 inch (ACE bandage) (Generic) 1 x Per Day/30 Days Discharge Instructions: Secure with ACE bandage as directed. Secured With: [employer], 4.5x3.1 (in/yd) (Generic) 1 x Per Day/30 Days Discharge Instructions: Secure with Kerlix as directed. Patient Medications llergies: mango A Notifications Medication Indication Start End 09/11/2021 Bactrim DS DOSE 1 - oral 800 mg-160 mg tablet - 1 tablet oral BID x 7 days Electronic Signature(s) Signed: 09/11/2021 4:26:57 PM By: Geralyn Corwin DO Previous Signature: 09/11/2021 4:06:03 PM Version By: Geralyn Corwin DO Entered By: Geralyn Corwin on 09/11/2021 16:16:00 -------------------------------------------------------------------------------- Problem List Details Patient Name: Date of Service: [name], [name] 09/11/2021 3:30 PM Medical Record Number: [id number] Patient Account Number: [id number] Date of Birth/Sex: Treating RN: May 18, 1980 (41 y.o. F) Primary Care Provider: Gwinda Passe Other Clinician: Referring Provider: Treating Provider/Extender: Grace Isaac in Treatment: 33 Active Problems ICD-10 Encounter Code Description Active Date MDM Diagnosis L97.514 Non-pressure chronic ulcer of other part of right foot with necrosis of bone 01/22/2021 No Yes E11.621 Type 2 diabetes mellitus with foot ulcer 01/22/2021  No Yes M86.9 Osteomyelitis, unspecified 01/22/2021 No Yes Inactive Problems Resolved Problems Electronic Signature(s) Signed: 09/11/2021 4:26:57 PM By: Geralyn Corwin DO Entered By: Geralyn Corwin on 09/11/2021 16:06:18 -------------------------------------------------------------------------------- Progress Note Details Patient Name: Date of Service: [name], [name] 09/11/2021 3:30 PM Medical Record Number: [id number] Patient Account Number: [id number] Date of Birth/Sex: Treating RN: 1980/12/31 (41 y.o. F) Primary Care Provider: Gwinda Passe Other Clinician: Referring Provider: Treating Provider/Extender: Grace Isaac in Treatment: 33 Subjective Chief Complaint Information obtained from Patient Osteomyelitis of the right foot status post transmetatarsal amputation with surgical site dehiscence History of Present Illness (HPI) Admission 01/22/2021 [name] is a 41 year old female with a past medical history of insulin-dependent uncontrolled type 2 diabetes with last hemoglobin A1c of 13.5, osteomyelitis of the right foot status post transmetatarsal amputation on 12/18/2020 that presents to the clinic for right foot wound. She has had dehiscence of the surgical site. She is currently using wet-to-dry dressings. She has a PICC line and receiving IV ceftriaxone daily for her osteomyelitis. There is an end date of 01/27/2021. She is also taking oral metronidazole. She currently denies systemic signs of infection. 1/19; patient presents for follow-up. She was diagnosed with a DVT to the right lower extremity 2 days ago. She is on Eliquis now. She is scheduled to see her infectious disease doctor tomorrow. She has been using Dakin's wet-to-dry dressings. She denies systemic signs of infection. 1/26; patient presents for follow-up. She saw infectious disease on 1/21 started on Augmentin. Her PICC line and IV ceftriaxone was discontinued.  Patient reports stability to her wound. She has been using Dakin's wet-to-dry dressings. She currently denies systemic signs of infection. 2/3; patient presents for follow-up. She continues to use Dakin's wet-to-dry dressings to the wound bed. She saw Dr. Manson Passey with infectious disease yesterday and is continuing Augmentin. T entative end date is 2/16. Patient reports following up with orthopedics. She states  there is no further plan from them. She currently denies systemic signs of infection. 2/10; patient presents for follow-up. She continues to use Dakin's wet to dry dressings. She is scheduled to have her MRI done on 2/14. She states that she had pain to the debridement site from last clinic visit and declines debridement today. She denies systemic signs of infection. She continues to have yellow thick drainage. 2/20; patient presents for follow-up. She continues to use Dakin's wet-to-dry dressings. She obtained her MRI. The results showed an abscess and she is scheduled to see her orthopedic surgeon on 2/23. She saw infectious disease 2/17 and her antibiotics were extended. She currently denies systemic signs of infection. 3/6; patient presents for follow-up. She had debridement and irrigation of her foot on 03/10/2021 due to abscess noted on MRI. She was started on IV ceftriaxone and oral Flagyl. She has no issues or complaints today. She has been using iodoform packing to the tunnel and Dakin's wet-to-dry to the opening. 03/26/2021: She continues on IV ceftriaxone and oral metronidazole. She has follow-up with infectious disease tomorrow. No significant issues or complaints today. Her mother continues to help her with her wound dressing, using iodoform packing strips into the tunnel and Dakin's to the open portion of the wound. 3/20; patient presents for follow-up. She continues to be on IV ceftriaxone in oral metronidazole. She has been using iodoform to the tunnel and Dakin's wet-to- dry to  the open wound. She denies signs of infection. 3/27; patient presents for follow-up. She no longer has a PICC line. She has been using iodoform to the tunnel and Dakin's wet-to-dry to the open wound. She reports improvement in wound healing. She denies signs of infection. 4/3; patient presents for follow-up. She states she has been using Hydrofera Blue to the open wound and iodoform packing to the tunnel without any issues. She denies signs of infection. 4/18; patient presents for follow-up. She saw infectious disease on 4/11. She has finished her oral antibiotics and completed a total of 6 weeks of antibiotics (this includes IV as well). No further antibiotics needed. She has been using Hydrofera Blue and iodoform packing. She states that the tunneled area has come in and the iodoform is not staying in place anymore. She has no issues or complaints today. She denies signs of infection. 4/24; patient presents for follow-up. She saw Dr. Carlene Coria, plastic surgery to discuss potential skin graft/substitute placement. At this time he thinks that the skin graft would likely not take. He is in agreement with trying a wound VAC. Patient has been using Hydrofera Blue dressing changes with no issues. She denies signs of infection. She reports improvement in wound healing. 5/1; patient presents for follow-up. Unfortunately patient did not have insurance when we ran for the pico. There is an assistance program and we are trying to get this accommodated for the patient. In the meantime she has been using Hydrofera Blue without any issues. She denies signs of infection. 5/8; patient presents for follow-up. We have not heard back if pico is covered by her insurance. She has been using collagen to the wound bed over the past week. She denies signs of infection. 5/18; patient presents for follow-up. She has been using collagen to the wound bed without issues. Again we have not heard if pico is covered by her  insurance. She has no issues or complaints today. 5/23; patient presents for follow-up. She has been using collagen to the wound bed. She has no issues or complaints  today. [facility] and [facility] this [facility] today. She denies signs of infection. 6/1; patient presents for follow-up. She has been using the wound VAC for the past week. She has had this changed twice since she was last here. She reports more maceration to the periwound. She denies signs of infection. 6/7; right TMA site. There are 2 wounds 0 separated by a bridge of healed tissue. The more lateral area has undermining. Both areas have healthy looking granulation at the base but relative the size of the wound is fairly deep. There is no exposed bone no evidence of infection. Her wound VAC was put on hold last week because of surrounding skin maceration she has been using collagen this week. She has a modified shoe 6/12; patient presents for follow-up. Last week the wound VAC was reinitiated. She had no issues with the wound VAC itself. Today she has maceration again noted to the surrounding skin. She denies signs of infection. 6/27; patient presents for follow-up. She has been using Medihoney to the wound bed. We took a break from the wound VAC because the periwound was macerated. She still has some areas of maceration to the distal foot where there is a callus. She currently denies signs of infection. 7/11; patient presents for follow-up. She has been using Medihoney to the wound bed. She has developed some increased warmth and erythema to the lateral aspect of the right foot. She states this is occurred over the past week and there is increased pain. No drainage noted. 7/17; patient presents for follow-up. She has been using Medihoney to the wound bed. She completed her course of Bactrim. She reports improvement in symptoms. 7/24; Patient presents for follow up. She has been using Medihoney to the wound bed without  issues. She completed another course of Bactrim. She reports improvement in her symptoms but still has some mild tenderness to the medial aspect of the foot. 7/31; Patient presents for follow-up. She has been using Medihoney and Dakin's to the wound bed. She denies signs of infection. 8/14; patient presents for follow-up. She has been using Dakin's wet-to-dry packing strips to the right medial aspect of the amputation site and Medihoney to the anterior site. She has no issues or complaints today. She has started physical therapy. She denies signs of infection. 8/29; patient presents for follow-up. She has been using Dakin's wet-to-dry packing strips to the right medial aspect of the amputation site however this is becoming more difficult to place. She did report that she had increased redness and swelling to that site and developed some drainage. It has resolved. She continues with physical therapy. Patient History Information obtained from Patient. Family History Cancer - Paternal Grandparents, Diabetes - Mother, Hypertension - Mother, Stroke - Maternal Grandparents, No family history of Heart Disease, Hereditary Spherocytosis, Kidney Disease, Lung Disease, Seizures, Thyroid Problems, Tuberculosis. Social History Never smoker, Marital Status - Single, Alcohol Use - Rarely, Drug Use - Prior History - Marijuana, Caffeine Use - Daily. Medical History Cardiovascular Patient has history of Hypertension Endocrine Patient has history of Type II Diabetes Musculoskeletal Patient has history of Osteomyelitis - Right Transmet 12/18/20 Neurologic Patient has history of Neuropathy Objective Constitutional respirations regular, non-labored and within target range for patient.. Vitals Time Taken: 3:38 PM, Height: 69 in, Temperature: 98.2 F, Pulse: 83 bpm, Respiratory Rate: 18 breaths/min, Blood Pressure: 130/81 mmHg, Capillary Blood Glucose: 129 mg/dl. Cardiovascular 2+ dorsalis pedis/posterior  tibialis pulses. Psychiatric pleasant and cooperative. General Notes: Right foot:  T the transmetatarsal amputation site there is callus to the entire section except for the medial aspect where there is tunneling. No o increased warmth, erythema or purulent drainage noted. Integumentary (Hair, Skin) Wound #2 status is Open. Original cause of wound was Surgical Injury. The date acquired was: 07/10/2021. The wound has been in treatment 9 weeks. The wound is located on the Right,Medial Amputation Site - Transmetatarsal. The wound measures 0.2cm length x 0.3cm width x 3cm depth; 0.047cm^2 area and 0.141cm^3 volume. There is Fat Layer (Subcutaneous Tissue) exposed. There is no tunneling or undermining noted. There is a medium amount of serosanguineous drainage noted. The wound margin is distinct with the outline attached to the wound base. There is large (67-100%) red, pink granulation within the wound bed. There is a small (1-33%) amount of necrotic tissue within the wound bed including Adherent Slough. Wound #3 status is Healed - Epithelialized. Original cause of wound was Other Lesion. The date acquired was: 08/06/2021. The wound has been in treatment 5 weeks. The wound is located on the Right,Anterior Amputation Site - Transmetatarsal. The wound measures 0cm length x 0cm width x 0cm depth; 0cm^2 area and 0cm^3 volume. Assessment Active Problems ICD-10 Non-pressure chronic ulcer of other part of right foot with necrosis of bone Type 2 diabetes mellitus with foot ulcer Osteomyelitis, unspecified Patient now only has the tunnel remaining to the medial aspect. Due to her symptoms This past week I we will place her back on antibiotics. Luckily she has no increased warmth, erythema or purulent drainage currently. For now I recommended using silver alginate to the opening to collect drainage but the tunnel is too narrow to put any dressing into this. I also recommended trying to aggressively offload the  area and not putting pressure during physical therapy at least for the next week. Follow-up in 1 week. Plan Follow-up Appointments: Return Appointment in 1 week. - 09/20/21 @ 8:45am with Dr. Mikey Bussing and Lennox Laity, RN (Room 7) Other: - Prescription sent to pharmacy, take as directed. Anesthetic: (In clinic) Topical Lidocaine 5% applied to wound bed (In clinic) Topical Lidocaine 4% applied to wound bed Bathing/ Shower/ Hygiene: Other Bathing/Shower/Hygiene Orders/Instructions: - Clean with Saline or Dakins Edema Control - Lymphedema / SCD / Other: Elevate legs to the level of the heart or above for 30 minutes daily and/or when sitting, a frequency of: - throughout the day Avoid standing for long periods of time. - Limit time/pressure on feet, reduce PT Use wheelchair instead of walker when you can. . Moisturize legs daily. Off-Loading: Open toe surgical shoe to: - May use surgical shoe Additional Orders / Instructions: Follow Nutritious Diet - -Monitor/Control Blood Sugar -High Protein Diet The following medication(s) was prescribed: Bactrim DS oral 800 mg-160 mg tablet 1 1 tablet oral BID x 7 days starting 09/11/2021 WOUND #2: - Amputation Site - Transmetatarsal Wound Laterality: Right, Medial Cleanser: Normal Saline (Generic) 1 x Per Day/30 Days Discharge Instructions: Cleanse the wound with Normal Saline prior to applying a clean dressing using gauze sponges, not tissue or cotton balls. Prim Dressing: KerraCel Ag Gelling Fiber Dressing, 2x2 in (silver alginate) 1 x Per Day/30 Days ary Discharge Instructions: Apply silver alginate to wound bed as instructed Secondary Dressing: Woven Gauze Sponge, Non-Sterile 4x4 in 1 x Per Day/30 Days Discharge Instructions: Apply over primary dressing as directed. Secured With: Elastic Bandage 4 inch (ACE bandage) (Generic) 1 x Per Day/30 Days Discharge Instructions: Secure with ACE bandage as directed. Secured With: [employer], 4.5x3.1 (  in/yd)  (Generic) 1 x Per Day/30 Days Discharge Instructions: Secure with Kerlix as directed. 1. Bactrim 2. Aggressive offloading 3. Follow-up in 1 week Electronic Signature(s) Signed: 09/11/2021 4:26:57 PM By: Geralyn Corwin DO Entered By: Geralyn Corwin on 09/11/2021 16:17:59 -------------------------------------------------------------------------------- HxROS Details Patient Name: Date of Service: [name], [name] 09/11/2021 3:30 PM Medical Record Number: [id number] Patient Account Number: [id number] Date of Birth/Sex: Treating RN: 02/17/1980 (41 y.o. F) Primary Care Provider: Gwinda Passe Other Clinician: Referring Provider: Treating Provider/Extender: Grace Isaac in Treatment: 33 Information Obtained From Patient Cardiovascular Medical History: Positive for: Hypertension Endocrine Medical History: Positive for: Type II Diabetes Time with diabetes: Dx 2009 Treated with: Insulin, Oral agents Blood sugar tested every day: Yes Tested : daily Musculoskeletal Medical History: Positive for: Osteomyelitis - Right Transmet 12/18/20 Neurologic Medical History: Positive for: Neuropathy Immunizations Pneumococcal Vaccine: Received Pneumococcal Vaccination: No Implantable Devices Yes Family and Social History Cancer: Yes - Paternal Grandparents; Diabetes: Yes - Mother; Heart Disease: No; Hereditary Spherocytosis: No; Hypertension: Yes - Mother; Kidney Disease: No; Lung Disease: No; Seizures: No; Stroke: Yes - Maternal Grandparents; Thyroid Problems: No; Tuberculosis: No; Never smoker; Marital Status - Single; Alcohol Use: Rarely; Drug Use: Prior History - Marijuana; Caffeine Use: Daily; Financial Concerns: No; Food, Clothing or Shelter Needs: No; Support System Lacking: No; Transportation Concerns: No Electronic Signature(s) Signed: 09/11/2021 4:26:57 PM By: Geralyn Corwin DO Entered By: Geralyn Corwin on 09/11/2021  16:15:24 -------------------------------------------------------------------------------- SuperBill Details Patient Name: Date of Service: [name], [name] 09/11/2021 Medical Record Number: [id number] Patient Account Number: [id number] Date of Birth/Sex: Treating RN: 31-May-1980 (41 y.o. [name] Primary Care Provider: Gwinda Passe Other Clinician: Referring Provider: Treating Provider/Extender: Grace Isaac in Treatment: 33 Diagnosis Coding ICD-10 Codes Code Description [phone] Non-pressure chronic ulcer of other part of right foot with necrosis of bone E11.621 Type 2 diabetes mellitus with foot ulcer M86.9 Osteomyelitis, unspecified Facility Procedures CPT4 Code: [id number] Description: [phone] - WOUND CARE VISIT-LEV 2 EST PT Modifier: Quantity: 1 Physician Procedures : CPT4 Code Description Modifier [id number] - WC PHYS LEVEL 3 - EST PT ICD-10 Diagnosis Description L97.514 Non-pressure chronic ulcer of other part of right foot with necrosis of bone E11.621 Type 2 diabetes mellitus with foot ulcer M86.9  Osteomyelitis, unspecified Quantity: 1 Electronic Signature(s) Signed: 09/11/2021 4:26:57 PM By: Geralyn Corwin DO Entered By: Geralyn Corwin on 09/11/2021 16:18:20

## 2021-09-12 ENCOUNTER — Other Ambulatory Visit: Payer: Self-pay

## 2021-09-20 ENCOUNTER — Encounter (HOSPITAL_BASED_OUTPATIENT_CLINIC_OR_DEPARTMENT_OTHER): Payer: Medicaid Other | Admitting: Internal Medicine

## 2021-09-21 ENCOUNTER — Other Ambulatory Visit: Payer: Self-pay

## 2021-09-21 ENCOUNTER — Encounter (HOSPITAL_BASED_OUTPATIENT_CLINIC_OR_DEPARTMENT_OTHER): Payer: Medicaid Other | Attending: Internal Medicine | Admitting: Internal Medicine

## 2021-09-21 DIAGNOSIS — Z86718 Personal history of other venous thrombosis and embolism: Secondary | ICD-10-CM | POA: Diagnosis not present

## 2021-09-21 DIAGNOSIS — Z89431 Acquired absence of right foot: Secondary | ICD-10-CM | POA: Diagnosis not present

## 2021-09-21 DIAGNOSIS — M869 Osteomyelitis, unspecified: Secondary | ICD-10-CM | POA: Diagnosis not present

## 2021-09-21 DIAGNOSIS — E1169 Type 2 diabetes mellitus with other specified complication: Secondary | ICD-10-CM | POA: Insufficient documentation

## 2021-09-21 DIAGNOSIS — L97514 Non-pressure chronic ulcer of other part of right foot with necrosis of bone: Secondary | ICD-10-CM | POA: Diagnosis not present

## 2021-09-21 DIAGNOSIS — E11621 Type 2 diabetes mellitus with foot ulcer: Secondary | ICD-10-CM | POA: Insufficient documentation

## 2021-09-21 MED ORDER — GENTAMICIN SULFATE 0.1 % EX OINT
1.0000 | TOPICAL_OINTMENT | Freq: Every day | CUTANEOUS | 0 refills | Status: AC
  Filled 2021-09-21: qty 30, 30d supply, fill #0

## 2021-09-21 NOTE — Progress Notes (Signed)
[name], [name] ([id number]) Visit Report for 09/21/2021 Chief Complaint Document Details Patient Name: Date of Service: [name] [name], [place] [name] 09/21/2021 10:15 A M Medical Record Number: [id number] Patient Account Number: [id number] Date of Birth/Sex: Treating RN: Aug 17, 1980 (41 y.o. F) Primary Care Provider: Gwinda Passe Other Clinician: Referring Provider: Treating Provider/Extender: Grace Isaac in Treatment: 34 Information Obtained from: Patient Chief Complaint Osteomyelitis of the right foot status post transmetatarsal amputation with surgical site dehiscence Electronic Signature(s) Signed: 09/21/2021 3:02:11 PM By: Geralyn Corwin DO Entered By: Geralyn Corwin on 09/21/2021 14:38:34 -------------------------------------------------------------------------------- HPI Details Patient Name: Date of Service: [name], [name] 09/21/2021 10:15 A M Medical Record Number: [id number] Patient Account Number: [id number] Date of Birth/Sex: Treating RN: 06/04/1980 (41 y.o. F) Primary Care Provider: Gwinda Passe Other Clinician: Referring Provider: Treating Provider/Extender: Grace Isaac in Treatment: 34 History of Present Illness HPI Description: Admission 01/22/2021 Ms. Eulogia Dismore [name] a 41 year old female with a past medical history of insulin-dependent uncontrolled type 2 diabetes with last hemoglobin A1c of 13.5, osteomyelitis of the right foot status post transmetatarsal amputation on 12/18/2020 that presents to the clinic for right foot wound. She has had dehiscence of the surgical site. [name] using wet-to-dry dressings. She has a PICC line and receiving IV ceftriaxone daily for her osteomyelitis. There [name] an end date of 01/27/2021. [name] also taking oral metronidazole. She currently denies systemic signs of infection. 1/19; patient presents for follow-up. She was diagnosed with a DVT to the right lower extremity 2  days ago. [name] on Eliquis now. [name] scheduled to see her infectious disease doctor tomorrow. She has been using Dakin's wet-to-dry dressings. She denies systemic signs of infection. 1/26; patient presents for follow-up. She saw infectious disease on 1/21 started on Augmentin. Her PICC line and IV ceftriaxone was discontinued. Patient reports stability to her wound. She has been using Dakin's wet-to-dry dressings. She currently denies systemic signs of infection. 2/3; patient presents for follow-up. She continues to use Dakin's wet-to-dry dressings to the wound bed. She saw Dr. Manson Passey with infectious disease yesterday and [name] continuing Augmentin. T entative end date [name] 2/16. Patient reports following up with orthopedics. She states there [name] no further plan from them. She currently denies systemic signs of infection. 2/10; patient presents for follow-up. She continues to use Dakin's wet to dry dressings. [name] scheduled to have her MRI done on 2/14. She states that she had pain to the debridement site from last clinic visit and declines debridement today. She denies systemic signs of infection. She continues to have yellow thick drainage. 2/20; patient presents for follow-up. She continues to use Dakin's wet-to-dry dressings. She obtained her MRI. The results showed an abscess and [name] scheduled to see her orthopedic surgeon on 2/23. She saw infectious disease 2/17 and her antibiotics were extended. She currently denies systemic signs of infection. 3/6; patient presents for follow-up. She had debridement and irrigation of her foot on 03/10/2021 due to abscess noted on MRI. She was started on IV ceftriaxone and oral Flagyl. She has no issues or complaints today. She has been using iodoform packing to the tunnel and Dakin's wet-to-dry to the opening. 03/26/2021: She continues on IV ceftriaxone and oral metronidazole. She has follow-up with infectious disease tomorrow. No significant issues or  complaints today. Her mother continues to help her with her wound dressing, using iodoform packing strips into the tunnel and Dakin's to the open portion  of the wound. 3/20; patient presents for follow-up. She continues to be on IV ceftriaxone in oral metronidazole. She has been using iodoform to the tunnel and Dakin's wet-to- dry to the open wound. She denies signs of infection. 3/27; patient presents for follow-up. She no longer has a PICC line. She has been using iodoform to the tunnel and Dakin's wet-to-dry to the open wound. She reports improvement in wound healing. She denies signs of infection. 4/3; patient presents for follow-up. She states she has been using Hydrofera Blue to the open wound and iodoform packing to the tunnel without any issues. She denies signs of infection. 4/18; patient presents for follow-up. She saw infectious disease on 4/11. She has finished her oral antibiotics and completed a total of 6 weeks of antibiotics (this includes IV as well). No further antibiotics needed. She has been using Hydrofera Blue and iodoform packing. She states that the tunneled area has come in and the iodoform [name] not staying in place anymore. She has no issues or complaints today. She denies signs of infection. 4/24; patient presents for follow-up. She saw Dr. Carlene Coria, plastic surgery to discuss potential skin graft/substitute placement. At this time he thinks that the skin graft would likely not take. He [name] in agreement with trying a wound VAC. Patient has been using Hydrofera Blue dressing changes with no issues. She denies signs of infection. She reports improvement in wound healing. 5/1; patient presents for follow-up. Unfortunately patient did not have insurance when we ran for the pico. There [name] an assistance program and we are trying to get this accommodated for the patient. In the meantime she has been using Hydrofera Blue without any issues. She denies signs of infection. 5/8; patient  presents for follow-up. We have not heard back if pico [name] covered by her insurance. She has been using collagen to the wound bed over the past week. She denies signs of infection. 5/18; patient presents for follow-up. She has been using collagen to the wound bed without issues. Again we have not heard if pico [name] covered by her insurance. She has no issues or complaints today. 5/23; patient presents for follow-up. She has been using collagen to the wound bed. She has no issues or complaints today. She obtained the wound VAC from [facility] and brought this in today. She denies signs of infection. 6/1; patient presents for follow-up. She has been using the wound VAC for the past week. She has had this changed twice since she was last here. She reports more maceration to the periwound. She denies signs of infection. 6/7; right TMA site. There are 2 wounds 0 separated by a bridge of healed tissue. The more lateral area has undermining. Both areas have healthy looking granulation at the base but relative the size of the wound [name] fairly deep. There [name] no exposed bone no evidence of infection. Her wound VAC was put on hold last week because of surrounding skin maceration she has been using collagen this week. She has a modified shoe 6/12; patient presents for follow-up. Last week the wound VAC was reinitiated. She had no issues with the wound VAC itself. Today she has maceration again noted to the surrounding skin. She denies signs of infection. 6/27; patient presents for follow-up. She has been using Medihoney to the wound bed. We took a break from the wound VAC because the periwound was macerated. She still has some areas of maceration to the distal foot where there [name] a callus. She currently denies signs  of infection. 7/11; patient presents for follow-up. She has been using Medihoney to the wound bed. She has developed some increased warmth and erythema to the lateral aspect of the right foot. She states this  [name] occurred over the past week and there [name] increased pain. No drainage noted. 7/17; patient presents for follow-up. She has been using Medihoney to the wound bed. She completed her course of Bactrim. She reports improvement in symptoms. 7/24; Patient presents for follow up. She has been using Medihoney to the wound bed without issues. She completed another course of Bactrim. She reports improvement in her symptoms but still has some mild tenderness to the medial aspect of the foot. 7/31; Patient presents for follow-up. She has been using Medihoney and Dakin's to the wound bed. She denies signs of infection. 8/14; patient presents for follow-up. She has been using Dakin's wet-to-dry packing strips to the right medial aspect of the amputation site and Medihoney to the anterior site. She has no issues or complaints today. She has started physical therapy. She denies signs of infection. 8/29; patient presents for follow-up. She has been using Dakin's wet-to-dry packing strips to the right medial aspect of the amputation site however this [name] becoming more difficult to place. She did report that she had increased redness and swelling to that site and developed some drainage. It has resolved. She continues with physical therapy. 9/8; patient presents for follow-up. We have been using silver alginate to the tunneled area. She completed her course of antibiotics. She reports no pain, increased swelling or erythema. Electronic Signature(s) Signed: 09/21/2021 3:02:11 PM By: Geralyn Corwin DO Entered By: Geralyn Corwin on 09/21/2021 14:39:22 -------------------------------------------------------------------------------- Physical Exam Details Patient Name: Date of Service: [name], [name] 09/21/2021 10:15 A M Medical Record Number: [id number] Patient Account Number: [id number] Date of Birth/Sex: Treating RN: 24-Mar-1980 (41 y.o. F) Primary Care Provider: Gwinda Passe Other Clinician: Referring  Provider: Treating Provider/Extender: Grace Isaac in Treatment: 34 Constitutional respirations regular, non-labored and within target range for patient.. Cardiovascular 2+ dorsalis pedis/posterior tibialis pulses. Psychiatric pleasant and cooperative. Notes Right foot: T the transmetatarsal amputation site there [name] callus to the entire section except for the medial aspect where there [name] tunneling. No increased o warmth, erythema or purulent drainage noted. Electronic Signature(s) Signed: 09/21/2021 3:02:11 PM By: Geralyn Corwin DO Entered By: Geralyn Corwin on 09/21/2021 14:39:50 -------------------------------------------------------------------------------- Physician Orders Details Patient Name: Date of Service: [name], [name] 09/21/2021 10:15 A M Medical Record Number: [id number] Patient Account Number: [id number] Date of Birth/Sex: Treating RN: [birth year] (41 y.o. [name] Primary Care Provider: Gwinda Passe Other Clinician: Referring Provider: Treating Provider/Extender: Grace Isaac in Treatment: [phone] Verbal / Phone Orders: No Diagnosis Coding Follow-up Appointments ppointment in 2 weeks. - 10/04/21 with Dr. Mikey Bussing and Lennox Laity, RN (Room 7) Return A Anesthetic (In clinic) Topical Lidocaine 5% applied to wound bed (In clinic) Topical Lidocaine 4% applied to wound bed Bathing/ Shower/ Hygiene Other Bathing/Shower/Hygiene Orders/Instructions: - Clean with Saline or Dakins Edema Control - Lymphedema / SCD / Other Elevate legs to the level of the heart or above for 30 minutes daily and/or when sitting, a frequency of: - throughout the day void standing for long periods of time. - Limit time/pressure on feet, reduce PT Use wheelchair instead of walker when you can. . A Moisturize legs daily. Off-Loading Open toe surgical shoe to: - surgical shoe: reduce pressure when walking Additional Orders /  Instructions  Follow Nutritious Diet - -Monitor/Control Blood Sugar -High Protein Diet Wound Treatment Wound #2 - Amputation Site - Transmetatarsal Wound Laterality: Right, Medial Cleanser: Normal Saline (Generic) 1 x Per Day/30 Days Discharge Instructions: Cleanse the wound with Normal Saline prior to applying a clean dressing using gauze sponges, not tissue or cotton balls. Topical: Gentamicin 1 x Per Day/30 Days Discharge Instructions: As directed by physician Secondary Dressing: Woven Gauze Sponge, Non-Sterile 4x4 in 1 x Per Day/30 Days Discharge Instructions: Apply over primary dressing as directed. Secured With: Elastic Bandage 4 inch (ACE bandage) (Generic) 1 x Per Day/30 Days Discharge Instructions: Secure with ACE bandage as directed. Secured With: [employer], 4.5x3.1 (in/yd) (Generic) 1 x Per Day/30 Days Discharge Instructions: Secure with Kerlix as directed. Patient Medications llergies: mango A Notifications Medication Indication Start End 09/21/2021 gentamicin DOSE 1 - topical 0.1 % ointment - apply once daily to the wound bed Electronic Signature(s) Signed: 09/21/2021 3:02:11 PM By: Geralyn Corwin DO Previous Signature: 09/21/2021 2:45:52 PM Version By: Geralyn Corwin DO Previous Signature: 09/21/2021 12:22:14 PM Version By: Antonieta Iba Entered By: Geralyn Corwin on 09/21/2021 14:46:07 -------------------------------------------------------------------------------- Problem List Details Patient Name: Date of Service: [name], [name] 09/21/2021 10:15 A M Medical Record Number: [id number] Patient Account Number: [id number] Date of Birth/Sex: Treating RN: July 30, 1980 (41 y.o. F) Primary Care Provider: Gwinda Passe Other Clinician: Referring Provider: Treating Provider/Extender: Grace Isaac in Treatment: [phone] Active Problems ICD-10 Encounter Code Description Active Date MDM Diagnosis L97.514 Non-pressure chronic ulcer of  other part of right foot with necrosis of bone 01/22/2021 No Yes E11.621 Type 2 diabetes mellitus with foot ulcer 01/22/2021 No Yes M86.9 Osteomyelitis, unspecified 01/22/2021 No Yes Inactive Problems Resolved Problems Electronic Signature(s) Signed: 09/21/2021 3:02:11 PM By: Geralyn Corwin DO Entered By: Geralyn Corwin on 09/21/2021 14:38:19 -------------------------------------------------------------------------------- Progress Note Details Patient Name: Date of Service: [name], [name] 09/21/2021 10:15 A M Medical Record Number: [id number] Patient Account Number: [id number] Date of Birth/Sex: Treating RN: July 24, 1980 (41 y.o. F) Primary Care Provider: Gwinda Passe Other Clinician: Referring Provider: Treating Provider/Extender: Grace Isaac in Treatment: 34 Subjective Chief Complaint Information obtained from Patient Osteomyelitis of the right foot status post transmetatarsal amputation with surgical site dehiscence History of Present Illness (HPI) Admission 01/22/2021 Ms. Joleigh Mineau [name] a 41 year old female with a past medical history of insulin-dependent uncontrolled type 2 diabetes with last hemoglobin A1c of 13.5, osteomyelitis of the right foot status post transmetatarsal amputation on 12/18/2020 that presents to the clinic for right foot wound. She has had dehiscence of the surgical site. [name] using wet-to-dry dressings. She has a PICC line and receiving IV ceftriaxone daily for her osteomyelitis. There [name] an end date of 01/27/2021. [name] also taking oral metronidazole. She currently denies systemic signs of infection. 1/19; patient presents for follow-up. She was diagnosed with a DVT to the right lower extremity 2 days ago. [name] on Eliquis now. [name] scheduled to see her infectious disease doctor tomorrow. She has been using Dakin's wet-to-dry dressings. She denies systemic signs of infection. 1/26; patient presents for follow-up.  She saw infectious disease on 1/21 started on Augmentin. Her PICC line and IV ceftriaxone was discontinued. Patient reports stability to her wound. She has been using Dakin's wet-to-dry dressings. She currently denies systemic signs of infection. 2/3; patient presents for follow-up. She continues to use Dakin's wet-to-dry dressings to the wound bed. She saw Dr. Manson Passey with infectious disease  yesterday and [name] continuing Augmentin. T entative end date [name] 2/16. Patient reports following up with orthopedics. She states there [name] no further plan from them. She currently denies systemic signs of infection. 2/10; patient presents for follow-up. She continues to use Dakin's wet to dry dressings. [name] scheduled to have her MRI done on 2/14. She states that she had pain to the debridement site from last clinic visit and declines debridement today. She denies systemic signs of infection. She continues to have yellow thick drainage. 2/20; patient presents for follow-up. She continues to use Dakin's wet-to-dry dressings. She obtained her MRI. The results showed an abscess and [name] scheduled to see her orthopedic surgeon on 2/23. She saw infectious disease 2/17 and her antibiotics were extended. She currently denies systemic signs of infection. 3/6; patient presents for follow-up. She had debridement and irrigation of her foot on 03/10/2021 due to abscess noted on MRI. She was started on IV ceftriaxone and oral Flagyl. She has no issues or complaints today. She has been using iodoform packing to the tunnel and Dakin's wet-to-dry to the opening. 03/26/2021: She continues on IV ceftriaxone and oral metronidazole. She has follow-up with infectious disease tomorrow. No significant issues or complaints today. Her mother continues to help her with her wound dressing, using iodoform packing strips into the tunnel and Dakin's to the open portion of the wound. 3/20; patient presents for follow-up. She continues to be on  IV ceftriaxone in oral metronidazole. She has been using iodoform to the tunnel and Dakin's wet-to- dry to the open wound. She denies signs of infection. 3/27; patient presents for follow-up. She no longer has a PICC line. She has been using iodoform to the tunnel and Dakin's wet-to-dry to the open wound. She reports improvement in wound healing. She denies signs of infection. 4/3; patient presents for follow-up. She states she has been using Hydrofera Blue to the open wound and iodoform packing to the tunnel without any issues. She denies signs of infection. 4/18; patient presents for follow-up. She saw infectious disease on 4/11. She has finished her oral antibiotics and completed a total of 6 weeks of antibiotics (this includes IV as well). No further antibiotics needed. She has been using Hydrofera Blue and iodoform packing. She states that the tunneled area has come in and the iodoform [name] not staying in place anymore. She has no issues or complaints today. She denies signs of infection. 4/24; patient presents for follow-up. She saw Dr. Carlene Coria, plastic surgery to discuss potential skin graft/substitute placement. At this time he thinks that the skin graft would likely not take. He [name] in agreement with trying a wound VAC. Patient has been using Hydrofera Blue dressing changes with no issues. She denies signs of infection. She reports improvement in wound healing. 5/1; patient presents for follow-up. Unfortunately patient did not have insurance when we ran for the [place]. There [name] an assistance program and we are trying to get this accommodated for the patient. In the meantime she has been using Hydrofera Blue without any issues. She denies signs of infection. 5/8; patient presents for follow-up. We have not heard back if pico [name] covered by her insurance. She has been using collagen to the wound bed over the past week. She denies signs of infection. 5/18; patient presents for follow-up. She has been  using collagen to the wound bed without issues. Again we have not heard if pico [name] covered by her insurance. She has no issues or complaints today. 5/23;  patient presents for follow-up. She has been using collagen to the wound bed. She has no issues or complaints today. She obtained the wound VAC from [facility] and brought this in today. She denies signs of infection. 6/1; patient presents for follow-up. She has been using the wound VAC for the past week. She has had this changed twice since she was last here. She reports more maceration to the periwound. She denies signs of infection. 6/7; right TMA site. There are 2 wounds 0 separated by a bridge of healed tissue. The more lateral area has undermining. Both areas have healthy looking granulation at the base but relative the size of the wound [name] fairly deep. There [name] no exposed bone no evidence of infection. Her wound VAC was put on hold last week because of surrounding skin maceration she has been using collagen this week. She has a modified shoe 6/12; patient presents for follow-up. Last week the wound VAC was reinitiated. She had no issues with the wound VAC itself. Today she has maceration again noted to the surrounding skin. She denies signs of infection. 6/27; patient presents for follow-up. She has been using Medihoney to the wound bed. We took a break from the wound VAC because the periwound was macerated. She still has some areas of maceration to the distal foot where there [name] a callus. She currently denies signs of infection. 7/11; patient presents for follow-up. She has been using Medihoney to the wound bed. She has developed some increased warmth and erythema to the lateral aspect of the right foot. She states this [name] occurred over the past week and there [name] increased pain. No drainage noted. 7/17; patient presents for follow-up. She has been using Medihoney to the wound bed. She completed her course of Bactrim. She reports improvement  in symptoms. 7/24; Patient presents for follow up. She has been using Medihoney to the wound bed without issues. She completed another course of Bactrim. She reports improvement in her symptoms but still has some mild tenderness to the medial aspect of the foot. 7/31; Patient presents for follow-up. She has been using Medihoney and Dakin's to the wound bed. She denies signs of infection. 8/14; patient presents for follow-up. She has been using Dakin's wet-to-dry packing strips to the right medial aspect of the amputation site and Medihoney to the anterior site. She has no issues or complaints today. She has started physical therapy. She denies signs of infection. 8/29; patient presents for follow-up. She has been using Dakin's wet-to-dry packing strips to the right medial aspect of the amputation site however this [name] becoming more difficult to place. She did report that she had increased redness and swelling to that site and developed some drainage. It has resolved. She continues with physical therapy. 9/8; patient presents for follow-up. We have been using silver alginate to the tunneled area. She completed her course of antibiotics. She reports no pain, increased swelling or erythema. Patient History Information obtained from Patient. Family History Cancer - Paternal Grandparents, Diabetes - Mother, Hypertension - Mother, Stroke - Maternal Grandparents, No family history of Heart Disease, Hereditary Spherocytosis, Kidney Disease, Lung Disease, Seizures, Thyroid Problems, Tuberculosis. Social History Never smoker, Marital Status - Single, Alcohol Use - Rarely, Drug Use - Prior History - Marijuana, Caffeine Use - Daily. Medical History Cardiovascular Patient has history of Hypertension Endocrine Patient has history of Type II Diabetes Musculoskeletal Patient has history of Osteomyelitis - Right Transmet 12/18/20 Neurologic Patient has history of  Neuropathy Objective Constitutional respirations regular,  non-labored and within target range for patient.. Vitals Time Taken: 10:33 AM, Height: 69 in, Temperature: 98.1 F, Pulse: 80 bpm, Respiratory Rate: 18 breaths/min, Blood Pressure: 101/69 mmHg, Capillary Blood Glucose: 129 mg/dl. Cardiovascular 2+ dorsalis pedis/posterior tibialis pulses. Psychiatric pleasant and cooperative. General Notes: Right foot: T the transmetatarsal amputation site there [name] callus to the entire section except for the medial aspect where there [name] tunneling. No o increased warmth, erythema or purulent drainage noted. Integumentary (Hair, Skin) Wound #2 status [name] Open. Original cause of wound was Surgical Injury. The date acquired was: 07/10/2021. The wound has been in treatment 10 weeks. The wound [name] located on the Right,Medial Amputation Site - Transmetatarsal. The wound measures 0.2cm length x 0.3cm width x 3cm depth; 0.047cm^2 area and 0.141cm^3 volume. There [name] Fat Layer (Subcutaneous Tissue) exposed. There [name] a medium amount of serosanguineous drainage noted. The wound margin [name] distinct with the outline attached to the wound base. There [name] large (67-100%) red, pink granulation within the wound bed. There [name] a small (1-33%) amount of necrotic tissue within the wound bed including Adherent Slough. 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 T the incision site on the most medial aspect there [name] a small tunnel. It [name] hard to get a dressing in this. I o recommended gentamicin ointment to see if this will help heal it up. She did well with Bactrim she no longer has tenderness to the medial aspect. She denies any erythema or warmth to the area as well. I recommended aggressive offloading to this area. Follow-up in 2  weeks. Plan Follow-up Appointments: Return Appointment in 2 weeks. - 10/04/21 with Dr. Mikey Bussing and Lennox Laity, RN (Room 7) Anesthetic: (In clinic) Topical Lidocaine 5% applied to wound bed (In clinic) Topical Lidocaine 4% applied to wound bed Bathing/ Shower/ Hygiene: Other Bathing/Shower/Hygiene Orders/Instructions: - Clean with Saline or Dakins Edema Control - Lymphedema / SCD / Other: Elevate legs to the level of the heart or above for 30 minutes daily and/or when sitting, a frequency of: - throughout the day Avoid standing for long periods of time. - Limit time/pressure on feet, reduce PT Use wheelchair instead of walker when you can. . Moisturize legs daily. Off-Loading: Open toe surgical shoe to: - surgical shoe: reduce pressure when walking Additional Orders / Instructions: Follow Nutritious Diet - -Monitor/Control Blood Sugar -High Protein Diet The following medication(s) was prescribed: gentamicin topical 0.1 % ointment 1 apply once daily to the wound bed starting 09/21/2021 WOUND #2: - Amputation Site - Transmetatarsal Wound Laterality: Right, Medial Cleanser: Normal Saline (Generic) 1 x Per Day/30 Days Discharge Instructions: Cleanse the wound with Normal Saline prior to applying a clean dressing using gauze sponges, not tissue or cotton balls. Topical: Gentamicin 1 x Per Day/30 Days Discharge Instructions: As directed by physician Secondary Dressing: Woven Gauze Sponge, Non-Sterile 4x4 in 1 x Per Day/30 Days Discharge Instructions: Apply over primary dressing as directed. Secured With: Elastic Bandage 4 inch (ACE bandage) (Generic) 1 x Per Day/30 Days Discharge Instructions: Secure with ACE bandage as directed. Secured With: [employer], 4.5x3.1 (in/yd) (Generic) 1 x Per Day/30 Days Discharge Instructions: Secure with Kerlix as directed. 1. Gentamicin ointment 2. Aggressive offloading 3. Follow-up in 2 weeks Electronic Signature(s) Signed: 09/21/2021 3:02:11 PM By: Geralyn Corwin DO Entered By: Geralyn Corwin on 09/21/2021 14:46:17 -------------------------------------------------------------------------------- HxROS Details  Patient Name: Date of Service: [name], [name] [name] 09/21/2021 10:15 A M Medical Record Number: [id number] Patient Account Number: [id number] Date of Birth/Sex: Treating RN: 1980-02-03 (41 y.o. F) Primary Care Provider: Gwinda PasseEdwards, Michelle Other Clinician: Referring Provider: Treating Provider/Extender: Grace IsaacHoffman, Chabeli Barsamian Edwards, Michelle Weeks in Treatment: 34 Information Obtained From Patient Cardiovascular Medical History: Positive for: Hypertension Endocrine Medical History: Positive for: Type II Diabetes Time with diabetes: Dx 2009 Treated with: Insulin, Oral agents Blood sugar tested every day: Yes Tested : daily Musculoskeletal Medical History: Positive for: Osteomyelitis - Right Transmet 12/18/20 Neurologic Medical History: Positive for: Neuropathy Immunizations Pneumococcal Vaccine: Received Pneumococcal Vaccination: No Implantable Devices Yes Family and Social History Cancer: Yes - Paternal Grandparents; Diabetes: Yes - Mother; Heart Disease: No; Hereditary Spherocytosis: No; Hypertension: Yes - Mother; Kidney Disease: No; Lung Disease: No; Seizures: No; Stroke: Yes - Maternal Grandparents; Thyroid Problems: No; Tuberculosis: No; Never smoker; Marital Status - Single; Alcohol Use: Rarely; Drug Use: Prior History - Marijuana; Caffeine Use: Daily; Financial Concerns: No; Food, Clothing or Shelter Needs: No; Support System Lacking: No; Transportation Concerns: No Electronic Signature(s) Signed: 09/21/2021 3:02:11 PM By: Geralyn CorwinHoffman, Shala Baumbach DO Entered By: Geralyn CorwinHoffman, Georges Victorio on 09/21/2021 14:39:30 -------------------------------------------------------------------------------- SuperBill Details Patient Name: Date of Service: [name], [name] 09/21/2021 Medical Record Number: [id number] Patient Account Number:  [id number] Date of Birth/Sex: Treating RN: 1980-02-03 (41 y.o. Roel CluckF) Barnhart, Jodi Primary Care Provider: Gwinda PasseEdwards, Michelle Other Clinician: Referring Provider: Treating Provider/Extender: Grace IsaacHoffman, Equan Cogbill Edwards, Michelle Weeks in Treatment: 34 Diagnosis Coding ICD-10 Codes Code Description [phone] Non-pressure chronic ulcer of other part of right foot with necrosis of bone E11.621 Type 2 diabetes mellitus with foot ulcer M86.9 Osteomyelitis, unspecified Facility Procedures CPT4 Code: [id number] 9 Description: 9213 - WOUND CARE VISIT-LEV 3 EST PT Modifier: Quantity: 1 Physician Procedures : CPT4 Code Description Modifier [id number] 99213 - WC PHYS LEVEL 3 - EST PT ICD-10 Diagnosis Description L97.514 Non-pressure chronic ulcer of other part of right foot with necrosis of bone E11.621 Type 2 diabetes mellitus with foot ulcer M86.9  Osteomyelitis, unspecified Quantity: 1 Electronic Signature(s) Signed: 09/21/2021 3:02:11 PM By: Geralyn CorwinHoffman, Alisea Matte DO Previous Signature: 09/21/2021 12:22:14 PM Version By: Antonieta IbaBarnhart, Jodi Entered By: Geralyn CorwinHoffman, Rayshawn Visconti on 09/21/2021 14:46:41

## 2021-09-21 NOTE — Progress Notes (Addendum)
Pottle, Nell L. ([id number]) Visit Report for 09/21/2021 Arrival Information Details Patient Name: Date of Service: [name], [name] [name] 09/21/2021 10:15 A M Medical Record Number: [id number] Patient Account Number: [id number] Date of Birth/Sex: Treating RN: November 12, 1980 (41 y.o. F) Primary Care Zacaria Pousson: Juluis Mire Other Clinician: Referring Treavon Castilleja: Treating Yale Golla/Extender: Edmonia Lynch in Treatment: 3 Visit Information History Since Last Visit Added or deleted any medications: No Patient Arrived: Walker Any new allergies or adverse reactions: No Arrival Time: 10:30 Had a fall or experienced change in No Accompanied By: self activities of daily living that may affect Transfer Assistance: None risk of falls: Patient Identification Verified: Yes Signs or symptoms of abuse/neglect since last visito No Secondary Verification Process Completed: Yes Hospitalized since last visit: No Patient Requires Transmission-Based No Has Dressing in Place as Prescribed: Yes Precautions: Pain Present Now: No Patient Has Alerts: Yes Patient Alerts: Patient on Blood Thinner PICC R Arm ABI 12/17/20 R=1.08 L=1.13 Electronic Signature(s) Signed: 09/21/2021 12:32:37 PM By: Erenest Blank Entered By: Erenest Blank on 09/21/2021 10:31:27 -------------------------------------------------------------------------------- Clinic Level of Care Assessment Details Patient Name: Date of Service: [name], [name] 09/21/2021 10:15 A M Medical Record Number: [id number] Patient Account Number: [id number] Date of Birth/Sex: Treating RN: 03-03-1980 (41 y.o. [name] Primary Care Torre Pikus: Juluis Mire Other Clinician: Referring Pal Shell: Treating Virgina Deakins/Extender: Edmonia Lynch in Treatment: 34 Clinic Level of Care Assessment Items TOOL 4 Quantity Score X- 1 0 Use when only an EandM is performed on FOLLOW-UP visit ASSESSMENTS -  Nursing Assessment / Reassessment X- 1 10 Reassessment of Co-morbidities (includes updates in patient status) X- 1 5 Reassessment of Adherence to Treatment Plan ASSESSMENTS - Wound and Skin A ssessment / Reassessment X - Simple Wound Assessment / Reassessment - one wound 1 5 []  - 0 Complex Wound Assessment / Reassessment - multiple wounds []  - 0 Dermatologic / Skin Assessment (not related to wound area) ASSESSMENTS - Focused Assessment []  - 0 Circumferential Edema Measurements - multi extremities []  - 0 Nutritional Assessment / Counseling / Intervention []  - 0 Lower Extremity Assessment (monofilament, tuning fork, pulses) []  - 0 Peripheral Arterial Disease Assessment (using hand held doppler) ASSESSMENTS - Ostomy and/or Continence Assessment and Care []  - 0 Incontinence Assessment and Management []  - 0 Ostomy Care Assessment and Management (repouching, etc.) PROCESS - Coordination of Care []  - 0 Simple Patient / Family Education for ongoing care X- 1 20 Complex (extensive) Patient / Family Education for ongoing care X- 1 10 Staff obtains Programmer, systems, Records, T Results / Process Orders est []  - 0 Staff telephones HHA, Nursing Homes / Clarify orders / etc []  - 0 Routine Transfer to another Facility (non-emergent condition) []  - 0 Routine Hospital Admission (non-emergent condition) []  - 0 New Admissions / Biomedical engineer / Ordering NPWT Apligraf, etc. , []  - 0 Emergency Hospital Admission (emergent condition) []  - 0 Simple Discharge Coordination []  - 0 Complex (extensive) Discharge Coordination PROCESS - Special Needs []  - 0 Pediatric / Minor Patient Management []  - 0 Isolation Patient Management []  - 0 Hearing / Language / Visual special needs []  - 0 Assessment of Community assistance (transportation, D/C planning, etc.) []  - 0 Additional assistance / Altered mentation []  - 0 Support Surface(s) Assessment (bed, cushion, seat, etc.) INTERVENTIONS -  Wound Cleansing / Measurement X - Simple Wound Cleansing - one wound 1 5 []  - 0 Complex Wound Cleansing - multiple wounds X- 1 5 Wound Imaging (  photographs - any number of wounds) []  - 0 Wound Tracing (instead of photographs) X- 1 5 Simple Wound Measurement - one wound []  - 0 Complex Wound Measurement - multiple wounds INTERVENTIONS - Wound Dressings []  - 0 Small Wound Dressing one or multiple wounds X- 1 15 Medium Wound Dressing one or multiple wounds []  - 0 Large Wound Dressing one or multiple wounds []  - 0 Application of Medications - topical []  - 0 Application of Medications - injection INTERVENTIONS - Miscellaneous []  - 0 External ear exam []  - 0 Specimen Collection (cultures, biopsies, blood, body fluids, etc.) []  - 0 Specimen(s) / Culture(s) sent or taken to Lab for analysis []  - 0 Patient Transfer (multiple staff / Civil Service fast streamer / Similar devices) []  - 0 Simple Staple / Suture removal (25 or less) []  - 0 Complex Staple / Suture removal (26 or more) []  - 0 Hypo / Hyperglycemic Management (close monitor of Blood Glucose) []  - 0 Ankle / Brachial Index (ABI) - do not check if billed separately X- 1 5 Vital Signs Has the patient been seen at the hospital within the last three years: Yes Total Score: 85 Level Of Care: New/Established - Level 3 Electronic Signature(s) Signed: 09/21/2021 12:22:14 PM By: Lorrin Jackson Entered By: Lorrin Jackson on 09/21/2021 11:02:12 -------------------------------------------------------------------------------- Encounter Discharge Information Details Patient Name: Date of Service: [name], [name] 09/21/2021 10:15 A M Medical Record Number: [id number] Patient Account Number: [id number] Date of Birth/Sex: Treating RN: [birth year] (41 y.o. [name] Primary Care Ervine Witucki: Juluis Mire Other Clinician: Referring Kenzly Rogoff: Treating Dahlila Pfahler/Extender: Edmonia Lynch in Treatment: [phone] Encounter  Discharge Information Items Discharge Condition: Stable Ambulatory Status: Walker Discharge Destination: Home Transportation: Other Schedule Follow-up Appointment: Yes Clinical Summary of Care: Provided on 09/21/2021 Form Type Recipient Paper Patient Patient Electronic Signature(s) Signed: 09/21/2021 12:22:14 PM By: Lorrin Jackson Entered By: Lorrin Jackson on 09/21/2021 11:11:16 -------------------------------------------------------------------------------- Lower Extremity Assessment Details Patient Name: Date of Service: [name], [name] 09/21/2021 10:15 A M Medical Record Number: [id number] Patient Account Number: [id number] Date of Birth/Sex: Treating RN: [birth year] (41 y.o. F) Primary Care Trindon Dorton: Juluis Mire Other Clinician: Referring Domnic Vantol: Treating Icelyn Navarrete/Extender: Geryl Councilman Weeks in Treatment: 34 Edema Assessment Assessed: [Left: No] [Right: No] Edema: [Left: Ye] [Right: s] Calf Left: Right: Point of Measurement: 34 cm From Medial Instep 50.8 cm Ankle Left: Right: Point of Measurement: 8 cm From Medial Instep 26 cm Electronic Signature(s) Signed: 09/21/2021 12:32:37 PM By: Erenest Blank Entered By: Erenest Blank on 09/21/2021 10:42:39 -------------------------------------------------------------------------------- Multi Wound Chart Details Patient Name: Date of Service: [name], [name] 09/21/2021 10:15 A M Medical Record Number: [id number] Patient Account Number: [id number] Date of Birth/Sex: Treating RN: March 02, 1980 (41 y.o. F) Primary Care Khila Papp: Juluis Mire Other Clinician: Referring Bravery Ketcham: Treating Wendal Wilkie/Extender: Edmonia Lynch in Treatment: 34 Vital Signs Height(in): 69 Capillary Blood Glucose(mg/dl): 129 Weight(lbs): Pulse(bpm): 46 Body Mass Index(BMI): Blood Pressure(mmHg): 101/69 Temperature(F): 98.1 Respiratory Rate(breaths/min): 18 Photos: [N/A:N/A] Right,  Medial Amputation Site - N/A N/A Wound Location: Transmetatarsal Surgical Injury N/A N/A Wounding Event: Dehisced Wound N/A N/A Primary Etiology: Hypertension, Type II Diabetes, N/A N/A Comorbid History: Osteomyelitis, Neuropathy 07/10/2021 N/A N/A Date Acquired: 10 N/A N/A Weeks of Treatment: Open N/A N/A Wound Status: No N/A N/A Wound Recurrence: 0.2x0.3x3 N/A N/A Measurements L x W x D (cm) 0.047 N/A N/A A (cm) : rea 0.141 N/A N/A Volume (cm) : 60.20% N/A N/A %  Reduction in Area: -302.90% N/A N/A % Reduction in Volume: Full Thickness Without Exposed N/A N/A Classification: Support Structures Medium N/A N/A Exudate Amount: Serosanguineous N/A N/A Exudate Type: red, brown N/A N/A Exudate Color: Distinct, outline attached N/A N/A Wound Margin: Large (67-100%) N/A N/A Granulation Amount: Red, Pink N/A N/A Granulation Quality: Small (1-33%) N/A N/A Necrotic Amount: Fat Layer (Subcutaneous Tissue): Yes N/A N/A Exposed Structures: Fascia: No Tendon: No Muscle: No Joint: No Bone: No Medium (34-66%) N/A N/A Epithelialization: Treatment Notes Wound #2 (Amputation Site - Transmetatarsal) Wound Laterality: Right, Medial Cleanser Normal Saline Discharge Instruction: Cleanse the wound with Normal Saline prior to applying a clean dressing using gauze sponges, not tissue or cotton balls. Peri-Wound Care Topical Gentamicin Discharge Instruction: As directed by physician Primary Dressing Secondary Dressing Woven Gauze Sponge, Non-Sterile 4x4 in Discharge Instruction: Apply over primary dressing as directed. Secured With Elastic Bandage 4 inch (ACE bandage) Discharge Instruction: Secure with ACE bandage as directed. Kerlix Roll Sterile, 4.5x3.1 (in/yd) Discharge Instruction: Secure with Kerlix as directed. Compression Wrap Compression Stockings Add-Ons Electronic Signature(s) Signed: 09/21/2021 3:02:11 PM By: Kalman Shan DO Entered By: Kalman Shan on 09/21/2021 14:38:25 -------------------------------------------------------------------------------- Multi-Disciplinary Care Plan Details Patient Name: Date of Service: [name], [name] 09/21/2021 10:15 A M Medical Record Number: [id number] Patient Account Number: [id number] Date of Birth/Sex: Treating RN: 1980/09/29 (41 y.o. [name] Primary Care Pluma Diniz: Juluis Mire Other Clinician: Referring Mirabel Ahlgren: Treating Ruchel Brandenburger/Extender: Edmonia Lynch in Treatment: 34 Active Inactive Nutrition Nursing Diagnoses: Impaired glucose control: actual or potential Goals: Patient/caregiver verbalizes understanding of need to maintain therapeutic glucose control per primary care physician Date Initiated: 01/22/2021 Target Resolution Date: 10/09/2021 Goal Status: Active Interventions: Assess HgA1c results as ordered upon admission and as needed Provide education on elevated blood sugars and impact on wound healing Treatment Activities: Obtain HgA1c : 01/22/2021 Notes: 07/10/21: Glucose control ongoing 09/11/21 : Glucose control ongoing, patient not compliant in checking glucose. Wound/Skin Impairment Nursing Diagnoses: Impaired tissue integrity Goals: Patient/caregiver will verbalize understanding of skin care regimen Date Initiated: 01/22/2021 Target Resolution Date: 10/09/2021 Goal Status: Active Ulcer/skin breakdown will have a volume reduction of 30% by week 4 Date Initiated: 01/22/2021 Date Inactivated: 03/19/2021 Target Resolution Date: 03/16/2021 Goal Status: Met Ulcer/skin breakdown will have a volume reduction of 50% by week 8 Date Initiated: 03/19/2021 Date Inactivated: 05/14/2021 Target Resolution Date: 04/16/2021 Unmet Reason: see wound Goal Status: Unmet measurements Interventions: Assess patient/caregiver ability to obtain necessary supplies Assess patient/caregiver ability to perform ulcer/skin care regimen upon admission and as  needed Assess ulceration(s) every visit Provide education on ulcer and skin care Treatment Activities: Topical wound management initiated : 01/22/2021 Notes: 06/04/21: Wound vac started 07/10/21: Wound care regimen continues Electronic Signature(s) Signed: 09/21/2021 12:22:14 PM By: Lorrin Jackson Entered By: Lorrin Jackson on 09/21/2021 10:47:06 -------------------------------------------------------------------------------- Pain Assessment Details Patient Name: Date of Service: DA [name] IS, [name] 09/21/2021 10:15 A M Medical Record Number: [id number] Patient Account Number: [id number] Date of Birth/Sex: Treating RN: [birth year] (42 y.o. F) Primary Care Pheonix Wisby: Juluis Mire Other Clinician: Referring Brittni Hult: Treating Aaronjames Kelsay/Extender: Edmonia Lynch in Treatment: 34 Active Problems Location of Pain Severity and Description of Pain Patient Has Paino No Site Locations Pain Management and Medication Current Pain Management: Electronic Signature(s) Signed: 09/21/2021 12:32:37 PM By: Erenest Blank Entered By: Erenest Blank on 09/21/2021 10:34:09 -------------------------------------------------------------------------------- Patient/Caregiver Education Details Patient Name: Date of Service: [name], [name] 9/8/2023andnbsp10:15 A M Medical Record  Number: [id number] Patient Account Number: [id number] Date of Birth/Gender: Treating RN: 03/25/1980 (41 y.o. [name] Primary Care Physician: Juluis Mire Other Clinician: Referring Physician: Treating Physician/Extender: Edmonia Lynch in Treatment: 58 Education Assessment Education Provided To: Patient Education Topics Provided Elevated Blood Sugar/ Impact on Healing: Methods: Explain/Verbal, Printed Responses: State content correctly Wound/Skin Impairment: Methods: Explain/Verbal, Printed Responses: State content correctly Electronic  Signature(s) Signed: 09/21/2021 12:22:14 PM By: Lorrin Jackson Entered By: Lorrin Jackson on 09/21/2021 10:47:25 -------------------------------------------------------------------------------- Wound Assessment Details Patient Name: Date of Service: [name], [name] 09/21/2021 10:15 A M Medical Record Number: [id number] Patient Account Number: [id number] Date of Birth/Sex: Treating RN: February 22, 1980 (41 y.o. F) Primary Care Cheris Tweten: Juluis Mire Other Clinician: Referring Jackey Housey: Treating Dorean Daniello/Extender: Edmonia Lynch in Treatment: 34 Wound Status Wound Number: 2 Primary Etiology: Dehisced Wound Wound Location: Right, Medial Amputation Site - Transmetatarsal Wound Status: Open Wounding Event: Surgical Injury Comorbid Hypertension, Type II Diabetes, Osteomyelitis, History: Neuropathy Date Acquired: 07/10/2021 Weeks Of Treatment: 10 Clustered Wound: No Photos Wound Measurements Length: (cm) 0.2 Width: (cm) 0.3 Depth: (cm) 3 Area: (cm) 0.047 Volume: (cm) 0.141 % Reduction in Area: 60.2% % Reduction in Volume: -302.9% Epithelialization: Medium (34-66%) Wound Description Classification: Full Thickness Without Exposed Support Structures Wound Margin: Distinct, outline attached Exudate Amount: Medium Exudate Type: Serosanguineous Exudate Color: red, brown Foul Odor After Cleansing: No Slough/Fibrino Yes Wound Bed Granulation Amount: Large (67-100%) Exposed Structure Granulation Quality: Red, Pink Fascia Exposed: No Necrotic Amount: Small (1-33%) Fat Layer (Subcutaneous Tissue) Exposed: Yes Necrotic Quality: Adherent Slough Tendon Exposed: No Muscle Exposed: No Joint Exposed: No Bone Exposed: No Treatment Notes Wound #2 (Amputation Site - Transmetatarsal) Wound Laterality: Right, Medial Cleanser Normal Saline Discharge Instruction: Cleanse the wound with Normal Saline prior to applying a clean dressing using gauze sponges, not  tissue or cotton balls. Peri-Wound Care Topical Gentamicin Discharge Instruction: As directed by physician Primary Dressing Secondary Dressing Woven Gauze Sponge, Non-Sterile 4x4 in Discharge Instruction: Apply over primary dressing as directed. Secured With Elastic Bandage 4 inch (ACE bandage) Discharge Instruction: Secure with ACE bandage as directed. Kerlix Roll Sterile, 4.5x3.1 (in/yd) Discharge Instruction: Secure with Kerlix as directed. Compression Wrap Compression Stockings Add-Ons Electronic Signature(s) Signed: 09/21/2021 12:32:37 PM By: Erenest Blank Entered By: Erenest Blank on 09/21/2021 10:45:22 -------------------------------------------------------------------------------- Vitals Details Patient Name: Date of Service: [name], [name] 09/21/2021 10:15 A M Medical Record Number: [id number] Patient Account Number: [id number] Date of Birth/Sex: Treating RN: [birth year] (41 y.o. F) Primary Care Annet Manukyan: Juluis Mire Other Clinician: Referring Corinda Ammon: Treating Mikiya Nebergall/Extender: Edmonia Lynch in Treatment: 34 Vital Signs Time Taken: 10:33 Temperature (F): 98.1 Height (in): 69 Pulse (bpm): 80 Respiratory Rate (breaths/min): 18 Blood Pressure (mmHg): 101/69 Capillary Blood Glucose (mg/dl): 129 Reference Range: 80 - 120 mg / dl Electronic Signature(s) Signed: 09/21/2021 12:32:37 PM By: Erenest Blank Entered By: Erenest Blank on 09/21/2021 10:34:04

## 2021-09-25 ENCOUNTER — Other Ambulatory Visit: Payer: Self-pay

## 2021-09-26 ENCOUNTER — Ambulatory Visit (INDEPENDENT_AMBULATORY_CARE_PROVIDER_SITE_OTHER): Payer: Medicaid Other | Admitting: Primary Care

## 2021-10-04 ENCOUNTER — Other Ambulatory Visit (HOSPITAL_BASED_OUTPATIENT_CLINIC_OR_DEPARTMENT_OTHER): Payer: Self-pay | Admitting: Family Medicine

## 2021-10-04 ENCOUNTER — Other Ambulatory Visit (HOSPITAL_COMMUNITY): Payer: Self-pay

## 2021-10-04 ENCOUNTER — Other Ambulatory Visit: Payer: Self-pay | Admitting: Internal Medicine

## 2021-10-04 ENCOUNTER — Other Ambulatory Visit: Payer: Self-pay | Admitting: Family Medicine

## 2021-10-04 ENCOUNTER — Other Ambulatory Visit: Payer: Self-pay

## 2021-10-04 ENCOUNTER — Encounter (HOSPITAL_BASED_OUTPATIENT_CLINIC_OR_DEPARTMENT_OTHER): Payer: Medicaid Other | Admitting: Internal Medicine

## 2021-10-04 ENCOUNTER — Other Ambulatory Visit (HOSPITAL_COMMUNITY): Payer: Self-pay | Admitting: Internal Medicine

## 2021-10-04 DIAGNOSIS — E11621 Type 2 diabetes mellitus with foot ulcer: Secondary | ICD-10-CM

## 2021-10-04 DIAGNOSIS — E13621 Other specified diabetes mellitus with foot ulcer: Secondary | ICD-10-CM

## 2021-10-04 DIAGNOSIS — L98499 Non-pressure chronic ulcer of skin of other sites with unspecified severity: Secondary | ICD-10-CM

## 2021-10-04 DIAGNOSIS — L97514 Non-pressure chronic ulcer of other part of right foot with necrosis of bone: Secondary | ICD-10-CM | POA: Diagnosis not present

## 2021-10-05 ENCOUNTER — Telehealth (HOSPITAL_BASED_OUTPATIENT_CLINIC_OR_DEPARTMENT_OTHER): Payer: Self-pay

## 2021-10-05 NOTE — Progress Notes (Signed)
Lardner, Lanice L. ([id number]) Visit Report for 10/04/2021 Chief Complaint Document Details Patient Name: Date of Service: [name] IS, [place] [name] 10/04/2021 12:30 PM Medical Record Number: [id number] Patient Account Number: [id number] Date of Birth/Sex: Treating RN: 07/22/1980 (41 y.o. F) Primary Care Provider: Gwinda Passe Other Clinician: Referring Provider: Treating Provider/Extender: Grace Isaac in Treatment: 36 Information Obtained from: Patient Chief Complaint Osteomyelitis of the right foot status post transmetatarsal amputation with surgical site dehiscence Electronic Signature(s) Signed: 10/05/2021 9:20:47 AM By: Geralyn Corwin DO Entered By: Geralyn Corwin on 10/04/2021 13:45:44 -------------------------------------------------------------------------------- Debridement Details Patient Name: Date of Service: [name], [name] 10/04/2021 12:30 PM Medical Record Number: [id number] Patient Account Number: [id number] Date of Birth/Sex: Treating RN: 1980/12/06 (41 y.o. Fredderick Phenix Primary Care Provider: Gwinda Passe Other Clinician: Referring Provider: Treating Provider/Extender: Grace Isaac in Treatment: 36 Debridement Performed for Assessment: Wound #2 Right,Medial Amputation Site - Transmetatarsal Performed By: Physician Geralyn Corwin, DO Debridement Type: Debridement Level of Consciousness (Pre-procedure): Awake and Alert Pre-procedure Verification/Time Out Yes - 13:12 Taken: Start Time: 13:12 Pain Control: Lidocaine 4% T opical Solution T Area Debrided (L x W): otal 1 (cm) x 0.5 (cm) = 0.5 (cm) Tissue and other material debrided: Non-Viable, Callus, Slough, Subcutaneous, Slough Level: Skin/Subcutaneous Tissue Debridement Description: Excisional Instrument: Curette Bleeding: Minimum Hemostasis Achieved: Pressure Response to Treatment: Procedure was tolerated well Level of Consciousness  (Post- Awake and Alert procedure): Post Debridement Measurements of Total Wound Length: (cm) 0.2 Width: (cm) 0.2 Depth: (cm) 2.5 Volume: (cm) 0.079 Character of Wound/Ulcer Post Debridement: Improved Post Procedure Diagnosis Same as Pre-procedure Electronic Signature(s) Signed: 10/04/2021 4:57:37 PM By: Samuella Bruin Signed: 10/05/2021 9:20:47 AM By: Geralyn Corwin DO Entered By: Samuella Bruin on 10/04/2021 13:14:40 -------------------------------------------------------------------------------- HPI Details Patient Name: Date of Service: [name], [name] 10/04/2021 12:30 PM Medical Record Number: [id number] Patient Account Number: [id number] Date of Birth/Sex: Treating RN: 1980-04-13 (41 y.o. F) Primary Care Provider: Gwinda Passe Other Clinician: Referring Provider: Treating Provider/Extender: Grace Isaac in Treatment: 36 History of Present Illness HPI Description: Admission 01/22/2021 Ms. Deondra Labrador is a 41 year old female with a past medical history of insulin-dependent uncontrolled type 2 diabetes with last hemoglobin A1c of 13.5, osteomyelitis of the right foot status post transmetatarsal amputation on 12/18/2020 that presents to the clinic for right foot wound. She has had dehiscence of the surgical site. She is currently using wet-to-dry dressings. She has a PICC line and receiving IV ceftriaxone daily for her osteomyelitis. There is an end date of 01/27/2021. She is also taking oral metronidazole. She currently denies systemic signs of infection. 1/19; patient presents for follow-up. She was diagnosed with a DVT to the right lower extremity 2 days ago. She is on Eliquis now. She is scheduled to see her infectious disease doctor tomorrow. She has been using Dakin's wet-to-dry dressings. She denies systemic signs of infection. 1/26; patient presents for follow-up. She saw infectious disease on 1/21 started on Augmentin. Her PICC line  and IV ceftriaxone was discontinued. Patient reports stability to her wound. She has been using Dakin's wet-to-dry dressings. She currently denies systemic signs of infection. 2/3; patient presents for follow-up. She continues to use Dakin's wet-to-dry dressings to the wound bed. She saw Dr. Manson Passey with infectious disease yesterday and is continuing Augmentin. T entative end date is 2/16. Patient reports following up with orthopedics. She states there is no further plan from them. She currently denies systemic  signs of infection. 2/10; patient presents for follow-up. She continues to use Dakin's wet to dry dressings. She is scheduled to have her MRI done on 2/14. She states that she had pain to the debridement site from last clinic visit and declines debridement today. She denies systemic signs of infection. She continues to have yellow thick drainage. 2/20; patient presents for follow-up. She continues to use Dakin's wet-to-dry dressings. She obtained her MRI. The results showed an abscess and she is scheduled to see her orthopedic surgeon on 2/23. She saw infectious disease 2/17 and her antibiotics were extended. She currently denies systemic signs of infection. 3/6; patient presents for follow-up. She had debridement and irrigation of her foot on 03/10/2021 due to abscess noted on MRI. She was started on IV ceftriaxone and oral Flagyl. She has no issues or complaints today. She has been using iodoform packing to the tunnel and Dakin's wet-to-dry to the opening. 03/26/2021: She continues on IV ceftriaxone and oral metronidazole. She has follow-up with infectious disease tomorrow. No significant issues or complaints today. Her mother continues to help her with her wound dressing, using iodoform packing strips into the tunnel and Dakin's to the open portion of the wound. 3/20; patient presents for follow-up. She continues to be on IV ceftriaxone in oral metronidazole. She has been using iodoform to  the tunnel and Dakin's wet-to- dry to the open wound. She denies signs of infection. 3/27; patient presents for follow-up. She no longer has a PICC line. She has been using iodoform to the tunnel and Dakin's wet-to-dry to the open wound. She reports improvement in wound healing. She denies signs of infection. 4/3; patient presents for follow-up. She states she has been using Hydrofera Blue to the open wound and iodoform packing to the tunnel without any issues. She denies signs of infection. 4/18; patient presents for follow-up. She saw infectious disease on 4/11. She has finished her oral antibiotics and completed a total of 6 weeks of antibiotics (this includes IV as well). No further antibiotics needed. She has been using Hydrofera Blue and iodoform packing. She states that the tunneled area has come in and the iodoform is not staying in place anymore. She has no issues or complaints today. She denies signs of infection. 4/24; patient presents for follow-up. She saw Dr. Carlene Coria, plastic surgery to discuss potential skin graft/substitute placement. At this time he thinks that the skin graft would likely not take. He is in agreement with trying a wound VAC. Patient has been using Hydrofera Blue dressing changes with no issues. She denies signs of infection. She reports improvement in wound healing. 5/1; patient presents for follow-up. Unfortunately patient did not have insurance when we ran for the pico. There is an assistance program and we are trying to get this accommodated for the patient. In the meantime she has been using Hydrofera Blue without any issues. She denies signs of infection. 5/8; patient presents for follow-up. We have not heard back if pico is covered by her insurance. She has been using collagen to the wound bed over the past week. She denies signs of infection. 5/18; patient presents for follow-up. She has been using collagen to the wound bed without issues. Again we have not  heard if pico is covered by her insurance. She has no issues or complaints today. 5/23; patient presents for follow-up. She has been using collagen to the wound bed. She has no issues or complaints today. She obtained the wound VAC from [facility] and brought this  in today. She denies signs of infection. 6/1; patient presents for follow-up. She has been using the wound VAC for the past week. She has had this changed twice since she was last here. She reports more maceration to the periwound. She denies signs of infection. 6/7; right TMA site. There are 2 wounds 0 separated by a bridge of healed tissue. The more lateral area has undermining. Both areas have healthy looking granulation at the base but relative the size of the wound is fairly deep. There is no exposed bone no evidence of infection. Her wound VAC was put on hold last week because of surrounding skin maceration she has been using collagen this week. She has a modified shoe 6/12; patient presents for follow-up. Last week the wound VAC was reinitiated. She had no issues with the wound VAC itself. Today she has maceration again noted to the surrounding skin. She denies signs of infection. 6/27; patient presents for follow-up. She has been using Medihoney to the wound bed. We took a break from the wound VAC because the periwound was macerated. She still has some areas of maceration to the distal foot where there is a callus. She currently denies signs of infection. 7/11; patient presents for follow-up. She has been using Medihoney to the wound bed. She has developed some increased warmth and erythema to the lateral aspect of the right foot. She states this is occurred over the past week and there is increased pain. No drainage noted. 7/17; patient presents for follow-up. She has been using Medihoney to the wound bed. She completed her course of Bactrim. She reports improvement in symptoms. 7/24; Patient presents for follow up. She has been using  Medihoney to the wound bed without issues. She completed another course of Bactrim. She reports improvement in her symptoms but still has some mild tenderness to the medial aspect of the foot. 7/31; Patient presents for follow-up. She has been using Medihoney and Dakin's to the wound bed. She denies signs of infection. 8/14; patient presents for follow-up. She has been using Dakin's wet-to-dry packing strips to the right medial aspect of the amputation site and Medihoney to the anterior site. She has no issues or complaints today. She has started physical therapy. She denies signs of infection. 8/29; patient presents for follow-up. She has been using Dakin's wet-to-dry packing strips to the right medial aspect of the amputation site however this is becoming more difficult to place. She did report that she had increased redness and swelling to that site and developed some drainage. It has resolved. She continues with physical therapy. 9/8; patient presents for follow-up. We have been using silver alginate to the tunneled area. She completed her course of antibiotics. She reports no pain, increased swelling or erythema. 9/21; patient presents for follow-up. She has been using gentamicin to the tunneled area. She has no issues or complaints today. She denies signs of infection. Electronic Signature(s) Signed: 10/05/2021 9:20:47 AM By: Geralyn Corwin DO Entered By: Geralyn Corwin on 10/04/2021 13:46:04 -------------------------------------------------------------------------------- Physical Exam Details Patient Name: Date of Service: [name], [name] 10/04/2021 12:30 PM Medical Record Number: [id number] Patient Account Number: [id number] Date of Birth/Sex: Treating RN: 1980/03/09 (41 y.o. F) Primary Care Provider: Gwinda Passe Other Clinician: Referring Provider: Treating Provider/Extender: Grace Isaac in Treatment: 36 Constitutional respirations regular,  non-labored and within target range for patient.. Cardiovascular 2+ dorsalis pedis/posterior tibialis pulses. Psychiatric pleasant and cooperative. Notes Right foot: T the transmetatarsal amputation site there is  callus to the entire section except for the medial aspect where there is tunneling. No increased o warmth or erythema noted. Electronic Signature(s) Signed: 10/05/2021 9:20:47 AM By: Geralyn Corwin DO Entered By: Geralyn Corwin on 10/04/2021 13:46:33 -------------------------------------------------------------------------------- Physician Orders Details Patient Name: Date of Service: [name], [name] 10/04/2021 12:30 PM Medical Record Number: [id number] Patient Account Number: [id number] Date of Birth/Sex: Treating RN: 02-08-1980 (41 y.o. Fredderick Phenix Primary Care Provider: Gwinda Passe Other Clinician: Referring Provider: Treating Provider/Extender: Grace Isaac in Treatment: [phone] Verbal / Phone Orders: No Diagnosis Coding Follow-up Appointments ppointment in 1 week. - Dr. Mikey Bussing and Lennox Laity room 7 Return A Anesthetic (In clinic) Topical Lidocaine 4% applied to wound bed Bathing/ Shower/ Hygiene Other Bathing/Shower/Hygiene Orders/Instructions: - Clean with Saline or Dakins Edema Control - Lymphedema / SCD / Other Elevate legs to the level of the heart or above for 30 minutes daily and/or when sitting, a frequency of: - throughout the day void standing for long periods of time. - Limit time/pressure on feet, reduce PT Use wheelchair instead of walker when you can. . A Moisturize legs daily. Off-Loading Open toe surgical shoe to: - surgical shoe: reduce pressure when walking Additional Orders / Instructions Follow Nutritious Diet - -Monitor/Control Blood Sugar -High Protein Diet Wound Treatment Wound #2 - Amputation Site - Transmetatarsal Wound Laterality: Right, Medial Cleanser: Normal Saline (Generic) 1 x Per Day/30  Days Discharge Instructions: Cleanse the wound with Normal Saline prior to applying a clean dressing using gauze sponges, not tissue or cotton balls. Prim Dressing: Plain packing strip 1/4 (in) 1 x Per Day/30 Days ary Discharge Instructions: Lightly pack as instructed Prim Dressing: Dakin's Solution 0.25%, 16 (oz) 1 x Per Day/30 Days ary Discharge Instructions: Moisten gauze with Dakin's solution Secondary Dressing: Woven Gauze Sponge, Non-Sterile 4x4 in 1 x Per Day/30 Days Discharge Instructions: Apply over primary dressing as directed. Secured With: Elastic Bandage 4 inch (ACE bandage) (Generic) 1 x Per Day/30 Days Discharge Instructions: Secure with ACE bandage as directed. Secured With: [employer], 4.5x3.1 (in/yd) (Generic) 1 x Per Day/30 Days Discharge Instructions: Secure with Kerlix as directed. Radiology Computed Tomography (CT) Scan , right foot with contrast - CT with contrast to right foot related to nonhealing diabetic ulcer looking for infection CPT 73701 ICD10: E11.621 M86.9 : Patient Medications llergies: mango A Notifications Medication Indication Start End 10/04/2021 lidocaine DOSE topical 4 % cream - cream topical Electronic Signature(s) Signed: 10/05/2021 9:20:47 AM By: Geralyn Corwin DO Signed: 10/05/2021 9:20:47 AM By: Geralyn Corwin DO Entered By: Geralyn Corwin on 10/04/2021 13:46:40 Prescription 10/04/2021 -------------------------------------------------------------------------------- Hund, Ember L. Geralyn Corwin DO Patient Name: Provider: 09-11-1980 [id number] Date of Birth: NPI#: [id number] Sex: DEA #: [phone] Phone #: License #: Eligha Bridegroom [facility] Patient Address: [address] [place] [address] , [address] [place], [place] [address] [phone] Allergies mango Provider's Orders Computed Tomography (CT) Scan , right foot with contrast - CT with contrast  to right foot related to nonhealing diabetic ulcer looking for infection CPT 73701 ICD10: E11.621 M86.9 : Hand Signature: Date(s): Electronic Signature(s) Signed: 10/05/2021 9:20:47 AM By: Geralyn Corwin DO Entered By: Geralyn Corwin on 10/04/2021 13:46:41 -------------------------------------------------------------------------------- Problem List Details Patient Name: Date of Service: [name], [name] 10/04/2021 12:30 PM Medical Record Number: [id number] Patient Account Number: [id number] Date of Birth/Sex: Treating RN: 10-13-1980 (41 y.o. Fredderick Phenix Primary Care  Provider: Gwinda Passe Other Clinician: Referring Provider: Treating Provider/Extender: Grace Isaac in Treatment: 36 Active Problems ICD-10 Encounter Code Description Active Date MDM Diagnosis L97.514 Non-pressure chronic ulcer of other part of right foot with necrosis of bone 01/22/2021 No Yes E11.621 Type 2 diabetes mellitus with foot ulcer 01/22/2021 No Yes M86.9 Osteomyelitis, unspecified 01/22/2021 No Yes Inactive Problems Resolved Problems Electronic Signature(s) Signed: 10/05/2021 9:20:47 AM By: Geralyn Corwin DO Entered By: Geralyn Corwin on 10/04/2021 13:45:20 -------------------------------------------------------------------------------- Progress Note Details Patient Name: Date of Service: [name], [name] 10/04/2021 12:30 PM Medical Record Number: [id number] Patient Account Number: [id number] Date of Birth/Sex: Treating RN: [birth year] (41 y.o. F) Primary Care Provider: Gwinda Passe Other Clinician: Referring Provider: Treating Provider/Extender: Grace Isaac in Treatment: 36 Subjective Chief Complaint Information obtained from Patient Osteomyelitis of the right foot status post transmetatarsal amputation with surgical site dehiscence History of Present Illness (HPI) Admission 01/22/2021 Ms. Bassheva Flury is a  41 year old female with a past medical history of insulin-dependent uncontrolled type 2 diabetes with last hemoglobin A1c of 13.5, osteomyelitis of the right foot status post transmetatarsal amputation on 12/18/2020 that presents to the clinic for right foot wound. She has had dehiscence of the surgical site. She is currently using wet-to-dry dressings. She has a PICC line and receiving IV ceftriaxone daily for her osteomyelitis. There is an end date of 01/27/2021. She is also taking oral metronidazole. She currently denies systemic signs of infection. 1/19; patient presents for follow-up. She was diagnosed with a DVT to the right lower extremity 2 days ago. She is on Eliquis now. She is scheduled to see her infectious disease doctor tomorrow. She has been using Dakin's wet-to-dry dressings. She denies systemic signs of infection. 1/26; patient presents for follow-up. She saw infectious disease on 1/21 started on Augmentin. Her PICC line and IV ceftriaxone was discontinued. Patient reports stability to her wound. She has been using Dakin's wet-to-dry dressings. She currently denies systemic signs of infection. 2/3; patient presents for follow-up. She continues to use Dakin's wet-to-dry dressings to the wound bed. She saw Dr. Manson Passey with infectious disease yesterday and is continuing Augmentin. T entative end date is 2/16. Patient reports following up with orthopedics. She states there is no further plan from them. She currently denies systemic signs of infection. 2/10; patient presents for follow-up. She continues to use Dakin's wet to dry dressings. She is scheduled to have her MRI done on 2/14. She states that she had pain to the debridement site from last clinic visit and declines debridement today. She denies systemic signs of infection. She continues to have yellow thick drainage. 2/20; patient presents for follow-up. She continues to use Dakin's wet-to-dry dressings. She obtained her MRI. The  results showed an abscess and she is scheduled to see her orthopedic surgeon on 2/23. She saw infectious disease 2/17 and her antibiotics were extended. She currently denies systemic signs of infection. 3/6; patient presents for follow-up. She had debridement and irrigation of her foot on 03/10/2021 due to abscess noted on MRI. She was started on IV ceftriaxone and oral Flagyl. She has no issues or complaints today. She has been using iodoform packing to the tunnel and Dakin's wet-to-dry to the opening. 03/26/2021: She continues on IV ceftriaxone and oral metronidazole. She has follow-up with infectious disease tomorrow. No significant issues or complaints today. Her mother continues to help her with her wound dressing, using iodoform packing strips into the tunnel and Dakin's to the  open portion of the wound. 3/20; patient presents for follow-up. She continues to be on IV ceftriaxone in oral metronidazole. She has been using iodoform to the tunnel and Dakin's wet-to- dry to the open wound. She denies signs of infection. 3/27; patient presents for follow-up. She no longer has a PICC line. She has been using iodoform to the tunnel and Dakin's wet-to-dry to the open wound. She reports improvement in wound healing. She denies signs of infection. 4/3; patient presents for follow-up. She states she has been using Hydrofera Blue to the open wound and iodoform packing to the tunnel without any issues. She denies signs of infection. 4/18; patient presents for follow-up. She saw infectious disease on 4/11. She has finished her oral antibiotics and completed a total of 6 weeks of antibiotics (this includes IV as well). No further antibiotics needed. She has been using Hydrofera Blue and iodoform packing. She states that the tunneled area has come in and the iodoform is not staying in place anymore. She has no issues or complaints today. She denies signs of infection. 4/24; patient presents for follow-up. She saw  Dr. Carlene Coria, plastic surgery to discuss potential skin graft/substitute placement. At this time he thinks that the skin graft would likely not take. He is in agreement with trying a wound VAC. Patient has been using Hydrofera Blue dressing changes with no issues. She denies signs of infection. She reports improvement in wound healing. 5/1; patient presents for follow-up. Unfortunately patient did not have insurance when we ran for the pico. There is an assistance program and we are trying to get this accommodated for the patient. In the meantime she has been using Hydrofera Blue without any issues. She denies signs of infection. 5/8; patient presents for follow-up. We have not heard back if pico is covered by her insurance. She has been using collagen to the wound bed over the past week. She denies signs of infection. 5/18; patient presents for follow-up. She has been using collagen to the wound bed without issues. Again we have not heard if pico is covered by her insurance. She has no issues or complaints today. 5/23; patient presents for follow-up. She has been using collagen to the wound bed. She has no issues or complaints today. She obtained the wound VAC from [facility] and brought this in today. She denies signs of infection. 6/1; patient presents for follow-up. She has been using the wound VAC for the past week. She has had this changed twice since she was last here. She reports more maceration to the periwound. She denies signs of infection. 6/7; right TMA site. There are 2 wounds 0 separated by a bridge of healed tissue. The more lateral area has undermining. Both areas have healthy looking granulation at the base but relative the size of the wound is fairly deep. There is no exposed bone no evidence of infection. Her wound VAC was put on hold last week because of surrounding skin maceration she has been using collagen this week. She has a modified shoe 6/12; patient presents for follow-up. Last  week the wound VAC was reinitiated. She had no issues with the wound VAC itself. Today she has maceration again noted to the surrounding skin. She denies signs of infection. 6/27; patient presents for follow-up. She has been using Medihoney to the wound bed. We took a break from the wound VAC because the periwound was macerated. She still has some areas of maceration to the distal foot where there is a callus. She  currently denies signs of infection. 7/11; patient presents for follow-up. She has been using Medihoney to the wound bed. She has developed some increased warmth and erythema to the lateral aspect of the right foot. She states this is occurred over the past week and there is increased pain. No drainage noted. 7/17; patient presents for follow-up. She has been using Medihoney to the wound bed. She completed her course of Bactrim. She reports improvement in symptoms. 7/24; Patient presents for follow up. She has been using Medihoney to the wound bed without issues. She completed another course of Bactrim. She reports improvement in her symptoms but still has some mild tenderness to the medial aspect of the foot. 7/31; Patient presents for follow-up. She has been using Medihoney and Dakin's to the wound bed. She denies signs of infection. 8/14; patient presents for follow-up. She has been using Dakin's wet-to-dry packing strips to the right medial aspect of the amputation site and Medihoney to the anterior site. She has no issues or complaints today. She has started physical therapy. She denies signs of infection. 8/29; patient presents for follow-up. She has been using Dakin's wet-to-dry packing strips to the right medial aspect of the amputation site however this is becoming more difficult to place. She did report that she had increased redness and swelling to that site and developed some drainage. It has resolved. She continues with physical therapy. 9/8; patient presents for follow-up. We  have been using silver alginate to the tunneled area. She completed her course of antibiotics. She reports no pain, increased swelling or erythema. 9/21; patient presents for follow-up. She has been using gentamicin to the tunneled area. She has no issues or complaints today. She denies signs of infection. Patient History Information obtained from Patient. Family History Cancer - Paternal Grandparents, Diabetes - Mother, Hypertension - Mother, Stroke - Maternal Grandparents, No family history of Heart Disease, Hereditary Spherocytosis, Kidney Disease, Lung Disease, Seizures, Thyroid Problems, Tuberculosis. Social History Never smoker, Marital Status - Single, Alcohol Use - Rarely, Drug Use - Prior History - Marijuana, Caffeine Use - Daily. Medical History Cardiovascular Patient has history of Hypertension Endocrine Patient has history of Type II Diabetes Musculoskeletal Patient has history of Osteomyelitis - Right Transmet 12/18/20 Neurologic Patient has history of Neuropathy Objective Constitutional respirations regular, non-labored and within target range for patient.. Vitals Time Taken: 12:50 PM, Height: 69 in, Temperature: 97.8 F, Pulse: 80 bpm, Respiratory Rate: 18 breaths/min, Blood Pressure: 109/74 mmHg. Cardiovascular 2+ dorsalis pedis/posterior tibialis pulses. Psychiatric pleasant and cooperative. General Notes: Right foot: T the transmetatarsal amputation site there is callus to the entire section except for the medial aspect where there is tunneling. No o increased warmth or erythema noted. Integumentary (Hair, Skin) Wound #2 status is Open. Original cause of wound was Surgical Injury. The date acquired was: 07/10/2021. The wound has been in treatment 12 weeks. The wound is located on the Right,Medial Amputation Site - Transmetatarsal. The wound measures 0.2cm length x 0.2cm width x 2.5cm depth; 0.031cm^2 area and 0.079cm^3 volume. There is no tunneling or undermining  noted. There is a small amount of purulent drainage noted. The wound margin is distinct with the outline attached to the wound base. There is no granulation within the wound bed. There is a large (67-100%) amount of necrotic tissue within the wound bed including Eschar. Assessment Active Problems ICD-10 Non-pressure chronic ulcer of other part of right foot with necrosis of bone Type 2 diabetes mellitus with foot ulcer Osteomyelitis, unspecified Patient's tunneled  wound is still present and had cloudy drainage when probing. This area has not improved in wound healing over the past several weeks and she has required antibiotics to treat for cellulitis to this area recently. She is also had an abscess develop to this area and require OR debridement. I am concerned she may start developing an abscess again. I recommended a CT scan to assess for this. For now I recommended Dakin's wet-to-dry packing strips. Follow-up in 1 week. Procedures Wound #2 Pre-procedure diagnosis of Wound #2 is a Dehisced Wound located on the Right,Medial Amputation Site - Transmetatarsal . There was a Excisional Skin/Subcutaneous Tissue Debridement with a total area of 0.5 sq cm performed by Geralyn Corwin, DO. With the following instrument(s): Curette to remove Non-Viable tissue/material. Material removed includes Callus, Subcutaneous Tissue, and Slough after achieving pain control using Lidocaine 4% T opical Solution. No specimens were taken. A time out was conducted at 13:12, prior to the start of the procedure. A Minimum amount of bleeding was controlled with Pressure. The procedure was tolerated well. Post Debridement Measurements: 0.2cm length x 0.2cm width x 2.5cm depth; 0.079cm^3 volume. Character of Wound/Ulcer Post Debridement is improved. Post procedure Diagnosis Wound #2: Same as Pre-Procedure Plan Follow-up Appointments: Return Appointment in 1 week. - Dr. Mikey Bussing and Lennox Laity room 7 Anesthetic: (In clinic)  Topical Lidocaine 4% applied to wound bed Bathing/ Shower/ Hygiene: Other Bathing/Shower/Hygiene Orders/Instructions: - Clean with Saline or Dakins Edema Control - Lymphedema / SCD / Other: Elevate legs to the level of the heart or above for 30 minutes daily and/or when sitting, a frequency of: - throughout the day Avoid standing for long periods of time. - Limit time/pressure on feet, reduce PT Use wheelchair instead of walker when you can. . Moisturize legs daily. Off-Loading: Open toe surgical shoe to: - surgical shoe: reduce pressure when walking Additional Orders / Instructions: Follow Nutritious Diet - -Monitor/Control Blood Sugar -High Protein Diet Radiology ordered were: Computed T omography (CT) Scan , right foot with contrast - CT with contrast to right foot related to nonhealing diabetic ulcer looking for infection CPT 73701 : ICD10: E11.621 M86.9 The following medication(s) was prescribed: lidocaine topical 4 % cream cream topical was prescribed at facility WOUND #2: - Amputation Site - Transmetatarsal Wound Laterality: Right, Medial Cleanser: Normal Saline (Generic) 1 x Per Day/30 Days Discharge Instructions: Cleanse the wound with Normal Saline prior to applying a clean dressing using gauze sponges, not tissue or cotton balls. Prim Dressing: Plain packing strip 1/4 (in) 1 x Per Day/30 Days ary Discharge Instructions: Lightly pack as instructed Prim Dressing: Dakin's Solution 0.25%, 16 (oz) 1 x Per Day/30 Days ary Discharge Instructions: Moisten gauze with Dakin's solution Secondary Dressing: Woven Gauze Sponge, Non-Sterile 4x4 in 1 x Per Day/30 Days Discharge Instructions: Apply over primary dressing as directed. Secured With: Elastic Bandage 4 inch (ACE bandage) (Generic) 1 x Per Day/30 Days Discharge Instructions: Secure with ACE bandage as directed. Secured With: [employer], 4.5x3.1 (in/yd) (Generic) 1 x Per Day/30 Days Discharge Instructions: Secure with  Kerlix as directed. 1. In office sharp debridement 2. Dakin's wet-to-dry packing strips 3. CT scan with contrast 4. Follow-up in 1 week Electronic Signature(s) Signed: 10/05/2021 9:20:47 AM By: Geralyn Corwin DO Entered By: Geralyn Corwin on 10/04/2021 13:49:39 -------------------------------------------------------------------------------- HxROS Details Patient Name: Date of Service: [name], [name] 10/04/2021 12:30 PM Medical Record Number: [id number] Patient Account Number: [id number] Date of Birth/Sex: Treating RN: 1980/10/09 (41 y.o. F) Primary Care  Provider: Gwinda PasseEdwards, Michelle Other Clinician: Referring Provider: Treating Provider/Extender: Grace IsaacHoffman, Lille Karim Edwards, Michelle Weeks in Treatment: 36 Information Obtained From Patient Cardiovascular Medical History: Positive for: Hypertension Endocrine Medical History: Positive for: Type II Diabetes Time with diabetes: Dx 2009 Treated with: Insulin, Oral agents Blood sugar tested every day: Yes Tested : daily Musculoskeletal Medical History: Positive for: Osteomyelitis - Right Transmet 12/18/20 Neurologic Medical History: Positive for: Neuropathy Immunizations Pneumococcal Vaccine: Received Pneumococcal Vaccination: No Implantable Devices Yes Family and Social History Cancer: Yes - Paternal Grandparents; Diabetes: Yes - Mother; Heart Disease: No; Hereditary Spherocytosis: No; Hypertension: Yes - Mother; Kidney Disease: No; Lung Disease: No; Seizures: No; Stroke: Yes - Maternal Grandparents; Thyroid Problems: No; Tuberculosis: No; Never smoker; Marital Status - Single; Alcohol Use: Rarely; Drug Use: Prior History - Marijuana; Caffeine Use: Daily; Financial Concerns: No; Food, Clothing or Shelter Needs: No; Support System Lacking: No; Transportation Concerns: No Electronic Signature(s) Signed: 10/05/2021 9:20:47 AM By: Geralyn CorwinHoffman, Forrest Jaroszewski DO Entered By: Geralyn CorwinHoffman, Jewelz Ricklefs on 10/04/2021  13:46:09 -------------------------------------------------------------------------------- SuperBill Details Patient Name: Date of Service: [name], [name] 10/04/2021 Medical Record Number: [id number] Patient Account Number: [id number] Date of Birth/Sex: Treating RN: 1980/08/27 (41 y.o. F) Primary Care Provider: Gwinda PasseEdwards, Michelle Other Clinician: Referring Provider: Treating Provider/Extender: Grace IsaacHoffman, Araeya Lamb Edwards, Michelle Weeks in Treatment: 36 Diagnosis Coding ICD-10 Codes Code Description [phone] Non-pressure chronic ulcer of other part of right foot with necrosis of bone E11.621 Type 2 diabetes mellitus with foot ulcer M86.9 Osteomyelitis, unspecified Facility Procedures CPT4 Code: [id number] Description: 11042 - DEB SUBQ TISSUE 20 SQ CM/< ICD-10 Diagnosis Description L97.514 Non-pressure chronic ulcer of other part of right foot with necrosis of bone E11.621 Type 2 diabetes mellitus with foot ulcer Modifier: Quantity: 1 Physician Procedures : CPT4 Code Description Modifier [id number] 11042 - WC PHYS SUBQ TISS 20 SQ CM ICD-10 Diagnosis Description L97.514 Non-pressure chronic ulcer of other part of right foot with necrosis of bone E11.621 Type 2 diabetes mellitus with foot ulcer Quantity: 1 Electronic Signature(s) Signed: 10/05/2021 9:20:47 AM By: Geralyn CorwinHoffman, Jalaiya Oyster DO Entered By: Geralyn CorwinHoffman, Ericca Labra on 10/04/2021 13:49:48

## 2021-10-05 NOTE — Progress Notes (Signed)
[name], [name] ([id number]) Visit Report for 10/04/2021 Arrival Information Details Patient Name: Date of Service: [name], [name] [name] 10/04/2021 12:30 PM Medical Record Number: [id number] Patient Account Number: [id number] Date of Birth/Sex: Treating RN: 04-01-1980 (41 y.o. [name], [name] Primary Care Kalinda Romaniello: Juluis Mire Other Clinician: Referring Austine Wiedeman: Treating Lemon Sternberg/Extender: Edmonia Lynch in Treatment: 75 Visit Information History Since Last Visit Added or deleted any medications: No Patient Arrived: Walker Any new allergies or adverse reactions: No Arrival Time: 12:47 Had a fall or experienced change in No Accompanied By: nurse activities of daily living that may affect Transfer Assistance: None risk of falls: Patient Identification Verified: Yes Signs or symptoms of abuse/neglect since last visito No Secondary Verification Process Completed: Yes Hospitalized since last visit: No Patient Requires Transmission-Based No Implantable device outside of the clinic excluding No Precautions: cellular tissue based products placed in the center Patient Has Alerts: Yes since last visit: Patient Alerts: Patient on Blood Thinner Has Dressing in Place as Prescribed: Yes PICC R Arm Pain Present Now: No ABI 12/17/20 R=1.08 L=1.13 Electronic Signature(s) Signed: 10/04/2021 4:57:37 PM By: Adline Peals Entered By: Adline Peals on 10/04/2021 12:47:29 -------------------------------------------------------------------------------- Encounter Discharge Information Details Patient Name: Date of Service: [name] V IS, [name] 10/04/2021 12:30 PM Medical Record Number: [id number] Patient Account Number: [id number] Date of Birth/Sex: Treating RN: [birth year] (41 y.o. [name] Primary Care Tya Haughey: Juluis Mire Other Clinician: Referring Toni Demo: Treating Kennedey Digilio/Extender: Edmonia Lynch in  Treatment: 36 Encounter Discharge Information Items Post Procedure Vitals Discharge Condition: Stable Temperature (F): 97.8 Ambulatory Status: Walker Pulse (bpm): 80 Discharge Destination: Home Respiratory Rate (breaths/min): 18 Transportation: Private Auto Blood Pressure (mmHg): 109/74 Accompanied By: caregiver Schedule Follow-up Appointment: Yes Clinical Summary of Care: Patient Declined Electronic Signature(s) Signed: 10/04/2021 4:57:37 PM By: Adline Peals Entered By: Adline Peals on 10/04/2021 13:57:58 -------------------------------------------------------------------------------- Lower Extremity Assessment Details Patient Name: Date of Service: [name] [name] IS, [name] 10/04/2021 12:30 PM Medical Record Number: [id number] Patient Account Number: [id number] Date of Birth/Sex: Treating RN: 03/09/1980 (41 y.o. [name] Primary Care Haidan Nhan: Juluis Mire Other Clinician: Referring Margarett Viti: Treating Bintou Lafata/Extender: Geryl Councilman Weeks in Treatment: 36 Edema Assessment Assessed: [Left: No] [Right: No] Edema: [Left: Ye] [Right: s] Calf Left: Right: Point of Measurement: 34 cm From Medial Instep 50.8 cm Ankle Left: Right: Point of Measurement: 8 cm From Medial Instep 26 cm Electronic Signature(s) Signed: 10/04/2021 4:57:37 PM By: Adline Peals Entered By: Adline Peals on 10/04/2021 12:52:47 -------------------------------------------------------------------------------- Multi Wound Chart Details Patient Name: Date of Service: [name] V IS, [name] 10/04/2021 12:30 PM Medical Record Number: [id number] Patient Account Number: [id number] Date of Birth/Sex: Treating RN: 01-05-1981 (41 y.o. F) Primary Care Alonah Lineback: Juluis Mire Other Clinician: Referring Reda Citron: Treating Sadiyah Kangas/Extender: Edmonia Lynch in Treatment: 36 Vital Signs Height(in): 63 Pulse(bpm):  57 Weight(lbs): Blood Pressure(mmHg): 109/74 Body Mass Index(BMI): Temperature(F): 97.8 Respiratory Rate(breaths/min): 18 Photos: [N/A:N/A] Right, Medial Amputation Site - N/A N/A Wound Location: Transmetatarsal Surgical Injury N/A N/A Wounding Event: Dehisced Wound N/A N/A Primary Etiology: Hypertension, Type II Diabetes, N/A N/A Comorbid History: Osteomyelitis, Neuropathy 07/10/2021 N/A N/A Date Acquired: 12 N/A N/A Weeks of Treatment: Open N/A N/A Wound Status: No N/A N/A Wound Recurrence: 0.2x0.2x2.5 N/A N/A Measurements L x W x D (cm) 0.031 N/A N/A A (cm) : rea 0.079 N/A N/A Volume (cm) : 73.70% N/A N/A % Reduction in Area: -125.70% N/A N/A % Reduction in Volume:  Full Thickness Without Exposed N/A N/A Classification: Support Structures Small N/A N/A Exudate A mount: Purulent N/A N/A Exudate Type: yellow, brown, green N/A N/A Exudate Color: Distinct, outline attached N/A N/A Wound Margin: None Present (0%) N/A N/A Granulation A mount: Large (67-100%) N/A N/A Necrotic A mount: Eschar N/A N/A Necrotic Tissue: Fascia: No N/A N/A Exposed Structures: Fat Layer (Subcutaneous Tissue): No Tendon: No Muscle: No Joint: No Bone: No Large (67-100%) N/A N/A Epithelialization: Debridement - Excisional N/A N/A Debridement: Pre-procedure Verification/Time Out 13:12 N/A N/A Taken: Lidocaine 4% Topical Solution N/A N/A Pain Control: Callus, Subcutaneous, Slough N/A N/A Tissue Debrided: Skin/Subcutaneous Tissue N/A N/A Level: 0.5 N/A N/A Debridement A (sq cm): rea Curette N/A N/A Instrument: Minimum N/A N/A Bleeding: Pressure N/A N/A Hemostasis A chieved: Procedure was tolerated well N/A N/A Debridement Treatment Response: 0.2x0.2x2.5 N/A N/A Post Debridement Measurements L x W x D (cm) 0.079 N/A N/A Post Debridement Volume: (cm) Debridement N/A N/A Procedures Performed: Treatment Notes Electronic Signature(s) Signed: 10/05/2021 9:20:47  AM By: Kalman Shan DO Entered By: Kalman Shan on 10/04/2021 13:45:25 -------------------------------------------------------------------------------- Multi-Disciplinary Care Plan Details Patient Name: Date of Service: [name] V IS, [name] 10/04/2021 12:30 PM Medical Record Number: [id number] Patient Account Number: [id number] Date of Birth/Sex: Treating RN: [birth year] (41 y.o. [name] Primary Care Joyanna Kleman: Juluis Mire Other Clinician: Referring Kenyetta Wimbish: Treating Findley Blankenbaker/Extender: Edmonia Lynch in Treatment: 36 Active Inactive Nutrition Nursing Diagnoses: Impaired glucose control: actual or potential Goals: Patient/caregiver verbalizes understanding of need to maintain therapeutic glucose control per primary care physician Date Initiated: 01/22/2021 Target Resolution Date: 11/09/2021 Goal Status: Active Interventions: Assess HgA1c results as ordered upon admission and as needed Provide education on elevated blood sugars and impact on wound healing Treatment Activities: Obtain HgA1c : 01/22/2021 Notes: 07/10/21: Glucose control ongoing 09/11/21 : Glucose control ongoing, patient not compliant in checking glucose. Wound/Skin Impairment Nursing Diagnoses: Impaired tissue integrity Goals: Patient/caregiver will verbalize understanding of skin care regimen Date Initiated: 01/22/2021 Target Resolution Date: 11/09/2021 Goal Status: Active Ulcer/skin breakdown will have a volume reduction of 30% by week 4 Date Initiated: 01/22/2021 Date Inactivated: 03/19/2021 Target Resolution Date: 03/16/2021 Goal Status: Met Ulcer/skin breakdown will have a volume reduction of 50% by week 8 Date Initiated: 03/19/2021 Date Inactivated: 05/14/2021 Target Resolution Date: 04/16/2021 Unmet Reason: see wound Goal Status: Unmet measurements Interventions: Assess patient/caregiver ability to obtain necessary supplies Assess patient/caregiver ability to  perform ulcer/skin care regimen upon admission and as needed Assess ulceration(s) every visit Provide education on ulcer and skin care Treatment Activities: Topical wound management initiated : 01/22/2021 Notes: 06/04/21: Wound vac started 07/10/21: Wound care regimen continues Electronic Signature(s) Signed: 10/04/2021 4:57:37 PM By: Adline Peals Entered By: Adline Peals on 10/04/2021 12:53:24 -------------------------------------------------------------------------------- Pain Assessment Details Patient Name: Date of Service: [name], [name] 10/04/2021 12:30 PM Medical Record Number: [id number] Patient Account Number: [id number] Date of Birth/Sex: Treating RN: 03-23-1980 (40 y.o. [name] Primary Care Jahleel Stroschein: Juluis Mire Other Clinician: Referring Cledith Kamiya: Treating Carrera Kiesel/Extender: Edmonia Lynch in Treatment: 36 Active Problems Location of Pain Severity and Description of Pain Patient Has Paino No Site Locations Rate the pain. Rate the pain. Current Pain Level: 0 Pain Management and Medication Current Pain Management: Electronic Signature(s) Signed: 10/04/2021 4:57:37 PM By: Adline Peals Entered By: Adline Peals on 10/04/2021 12:52:44 -------------------------------------------------------------------------------- Patient/Caregiver Education Details Patient Name: Date of Service: [name] V IS, [name] 9/21/2023andnbsp12:30 PM Medical Record Number: [id number] Patient Account Number:  [id number] Date of Birth/Gender: Treating RN: 1980-06-14 (41 y.o. [name] Primary Care Physician: Juluis Mire Other Clinician: Referring Physician: Treating Physician/Extender: Edmonia Lynch in Treatment: 31 Education Assessment Education Provided To: Patient Education Topics Provided Wound/Skin Impairment: Methods: Explain/Verbal Responses: Reinforcements needed,  State content correctly Electronic Signature(s) Signed: 10/04/2021 4:57:37 PM By: Adline Peals Entered By: Adline Peals on 10/04/2021 12:53:36 -------------------------------------------------------------------------------- Wound Assessment Details Patient Name: Date of Service: [name] [name] IS, [name] 10/04/2021 12:30 PM Medical Record Number: [id number] Patient Account Number: [id number] Date of Birth/Sex: Treating RN: 1980-10-14 (41 y.o. [name] Primary Care Anihya Tuma: Juluis Mire Other Clinician: Referring Quinzell Malcomb: Treating Chinelo Benn/Extender: Edmonia Lynch in Treatment: 36 Wound Status Wound Number: 2 Primary Etiology: Dehisced Wound Wound Location: Right, Medial Amputation Site - Transmetatarsal Wound Status: Open Wounding Event: Surgical Injury Comorbid Hypertension, Type II Diabetes, Osteomyelitis, History: Neuropathy Date Acquired: 07/10/2021 Weeks Of Treatment: 12 Clustered Wound: No Photos Wound Measurements Length: (cm) 0.2 Width: (cm) 0.2 Depth: (cm) 2.5 Area: (cm) 0.031 Volume: (cm) 0.079 % Reduction in Area: 73.7% % Reduction in Volume: -125.7% Epithelialization: Large (67-100%) Tunneling: No Undermining: No Wound Description Classification: Full Thickness Without Exposed Support Structures Wound Margin: Distinct, outline attached Exudate Amount: Small Exudate Type: Purulent Exudate Color: yellow, brown, green Foul Odor After Cleansing: No Slough/Fibrino No Wound Bed Granulation Amount: None Present (0%) Exposed Structure Necrotic Amount: Large (67-100%) Fascia Exposed: No Necrotic Quality: Eschar Fat Layer (Subcutaneous Tissue) Exposed: No Tendon Exposed: No Muscle Exposed: No Joint Exposed: No Bone Exposed: No Treatment Notes Wound #2 (Amputation Site - Transmetatarsal) Wound Laterality: Right, Medial Cleanser Normal Saline Discharge Instruction: Cleanse the wound with Normal Saline  prior to applying a clean dressing using gauze sponges, not tissue or cotton balls. Peri-Wound Care Topical Primary Dressing Plain packing strip 1/4 (in) Discharge Instruction: Lightly pack as instructed Dakin's Solution 0.25%, 16 (oz) Discharge Instruction: Moisten gauze with Dakin's solution Secondary Dressing Woven Gauze Sponge, Non-Sterile 4x4 in Discharge Instruction: Apply over primary dressing as directed. Secured With Elastic Bandage 4 inch (ACE bandage) Discharge Instruction: Secure with ACE bandage as directed. Kerlix Roll Sterile, 4.5x3.1 (in/yd) Discharge Instruction: Secure with Kerlix as directed. Compression Wrap Compression Stockings Add-Ons Electronic Signature(s) Signed: 10/04/2021 4:57:37 PM By: Adline Peals Entered By: Adline Peals on 10/04/2021 13:12:26 -------------------------------------------------------------------------------- Vitals Details Patient Name: Date of Service: [name] V IS, [name] 10/04/2021 12:30 PM Medical Record Number: [id number] Patient Account Number: [id number] Date of Birth/Sex: Treating RN: 11-27-1980 (41 y.o. [name] Primary Care Damarious Holtsclaw: Juluis Mire Other Clinician: Referring Garnetta Fedrick: Treating Lamir Racca/Extender: Edmonia Lynch in Treatment: 36 Vital Signs Time Taken: 12:50 Temperature (F): 97.8 Height (in): 69 Pulse (bpm): 80 Respiratory Rate (breaths/min): 18 Blood Pressure (mmHg): 109/74 Reference Range: 80 - 120 mg / dl Electronic Signature(s) Signed: 10/04/2021 4:57:37 PM By: Adline Peals Entered By: Adline Peals on 10/04/2021 12:50:23

## 2021-10-08 ENCOUNTER — Ambulatory Visit (HOSPITAL_BASED_OUTPATIENT_CLINIC_OR_DEPARTMENT_OTHER): Payer: Medicaid Other

## 2021-10-09 ENCOUNTER — Ambulatory Visit: Payer: Medicaid Other | Attending: Primary Care | Admitting: Pharmacist

## 2021-10-09 ENCOUNTER — Other Ambulatory Visit: Payer: Self-pay

## 2021-10-09 ENCOUNTER — Other Ambulatory Visit (INDEPENDENT_AMBULATORY_CARE_PROVIDER_SITE_OTHER): Payer: Self-pay | Admitting: Primary Care

## 2021-10-09 DIAGNOSIS — E1165 Type 2 diabetes mellitus with hyperglycemia: Secondary | ICD-10-CM

## 2021-10-09 DIAGNOSIS — Z760 Encounter for issue of repeat prescription: Secondary | ICD-10-CM

## 2021-10-09 DIAGNOSIS — Z794 Long term (current) use of insulin: Secondary | ICD-10-CM | POA: Diagnosis not present

## 2021-10-09 DIAGNOSIS — I10 Essential (primary) hypertension: Secondary | ICD-10-CM | POA: Diagnosis not present

## 2021-10-09 LAB — POCT GLYCOSYLATED HEMOGLOBIN (HGB A1C): HbA1c, POC (controlled diabetic range): 8.5 % — AB (ref 0.0–7.0)

## 2021-10-09 MED ORDER — LISINOPRIL 2.5 MG PO TABS
2.5000 mg | ORAL_TABLET | Freq: Every day | ORAL | 0 refills | Status: DC
  Filled 2021-10-09: qty 90, 90d supply, fill #0

## 2021-10-09 MED ORDER — LANTUS SOLOSTAR 100 UNIT/ML ~~LOC~~ SOPN
70.0000 [IU] | PEN_INJECTOR | Freq: Every day | SUBCUTANEOUS | 3 refills | Status: DC
  Filled 2021-10-09: qty 15, 21d supply, fill #0
  Filled 2021-12-31: qty 15, 21d supply, fill #1
  Filled 2022-02-21: qty 15, 21d supply, fill #2
  Filled 2022-04-02: qty 15, 21d supply, fill #3

## 2021-10-09 MED ORDER — TRULICITY 1.5 MG/0.5ML ~~LOC~~ SOAJ
1.5000 mg | SUBCUTANEOUS | 2 refills | Status: DC
  Filled 2021-10-09: qty 2, 28d supply, fill #0
  Filled 2021-12-31: qty 2, 28d supply, fill #1
  Filled 2022-02-21: qty 2, 28d supply, fill #2

## 2021-10-09 MED ORDER — INSULIN PEN NEEDLE 32G X 4 MM MISC
2 refills | Status: DC
  Filled 2021-10-09: qty 100, 90d supply, fill #0
  Filled 2021-12-31: qty 100, 90d supply, fill #1
  Filled 2022-07-02 – 2022-08-08 (×2): qty 100, 90d supply, fill #2

## 2021-10-09 MED ORDER — LANTUS SOLOSTAR 100 UNIT/ML ~~LOC~~ SOPN
60.0000 [IU] | PEN_INJECTOR | Freq: Every day | SUBCUTANEOUS | 3 refills | Status: DC
  Filled 2021-10-09: qty 15, 25d supply, fill #0

## 2021-10-09 NOTE — Progress Notes (Signed)
S:    [name] is a 41 y.o. female who presents for diabetes evaluation, education, and management.   PMH is significant for HTN, T2DM, diabetic foot infection and osteomyelitis, HLD, obesity, hx of DVT, hx of hyponatremia. Patient was referred and last seen by Primary Care Provider, Gwinda Passe, on 06/26/21. We saw her 08/02/2021 and made no changes. Since her last visit, she is endorsing poor wound healing to her right foot and is being followed by Dr. Mikey Bussing.   Today, patient arrives in good spirits and presents without any assistance. She tells me her she is following-up with her wound management team tomorrow. They have scheduled an MRI for concern of osteomyelitis. She denies fever, N/V. Denies any abdominal pain. She is tolerating the Trulicity, Hospital doctor and metformin well.   Family/Social History: former smoker; family hx of DM and HTN  Current diabetes medications include: Trulicity 1.5mg  weekly, Basaglar Kwikpen 60 units daily, metformin 1000mg  BID  Patient reports adherence to taking all medications as prescribed.   Insurance coverage: Medicaid   Patient denies hypoglycemic events.  Patient reported home CBG levels: not checking regularly.   Patient denies polyuria, polydipsia.  Patient endorses neuropathy (nerve pain).  Patient denies visual changes.  Patient reports self foot exams.   Patient reported dietary habits:  - Avoids sweets most of the time, tries to limit starches and carbs but admits to continued dietary indiscretion overall.  - Admits that she "needs to do better"  - She expresses interest in a dietician referral.   Patient-reported exercise habits: - Tries to walk as much as possible but is still limited overall   O:   Lab Results  Component Value Date   HGBA1C 8.5 (A) 10/09/2021   There were no vitals filed for this visit.  Lipid Panel     Component Value Date/Time   CHOL 222 (H) 06/26/2021 1052   TRIG 170 (H) 06/26/2021 1052    HDL 55 06/26/2021 1052   CHOLHDL 4.0 06/26/2021 1052   CHOLHDL 7.1 12/20/2020 0434   VLDL 21 12/20/2020 0434   LDLCALC 137 (H) 06/26/2021 1052    Clinical Atherosclerotic Cardiovascular Disease (ASCVD): No  The 10-year ASCVD risk score (Arnett DK, et al., 2019) is: 1.3%   Values used to calculate the score:     Age: 23 years     Sex: Female     Is Non-Hispanic African American: Yes     Diabetic: Yes     Tobacco smoker: No     Systolic Blood Pressure: 105 mmHg     Is BP treated: Yes     HDL Cholesterol: 55 mg/dL     Total Cholesterol: 222 mg/dL   Patient is participating in a Managed Medicaid Plan:  Yes   A/P: Diabetes longstanding, currently above goal. Patient is able to verbalize appropriate hypoglycemia management plan. Medication adherence appears optimal. Control is suboptimal due to dietary indiscretion and physical inactivity. -Increase Basaglar to 70 units daily.  -Continued Trulicity (dulaglutide) 1.5mg  weekly.  -Continued metformin 1000mg  BID.  -Extensively discussed pathophysiology of diabetes, recommended lifestyle interventions, dietary effects on blood sugar control.  -Counseled on s/sx of and management of hypoglycemia.  -Asked patient to take more BG readings at different times throughout the day and bring readings to next appointment.  -Next A1c anticipated 12/2021.  Written patient instructions provided. Patient verbalized understanding of treatment plan.  Total time in face to face counseling 30 minutes.    Follow-up:  Pharmacist visit  in 1-1.5 months.  Benard Halsted, PharmD, [facility] [place], [place] [phone]

## 2021-10-10 ENCOUNTER — Other Ambulatory Visit: Payer: Self-pay

## 2021-10-10 ENCOUNTER — Ambulatory Visit (HOSPITAL_BASED_OUTPATIENT_CLINIC_OR_DEPARTMENT_OTHER): Payer: Medicaid Other

## 2021-10-15 ENCOUNTER — Encounter (HOSPITAL_BASED_OUTPATIENT_CLINIC_OR_DEPARTMENT_OTHER): Payer: Medicaid Other | Attending: Internal Medicine | Admitting: Internal Medicine

## 2021-10-15 DIAGNOSIS — Z794 Long term (current) use of insulin: Secondary | ICD-10-CM | POA: Insufficient documentation

## 2021-10-15 DIAGNOSIS — L97514 Non-pressure chronic ulcer of other part of right foot with necrosis of bone: Secondary | ICD-10-CM | POA: Diagnosis not present

## 2021-10-15 DIAGNOSIS — Z7901 Long term (current) use of anticoagulants: Secondary | ICD-10-CM | POA: Insufficient documentation

## 2021-10-15 DIAGNOSIS — E1140 Type 2 diabetes mellitus with diabetic neuropathy, unspecified: Secondary | ICD-10-CM | POA: Insufficient documentation

## 2021-10-15 DIAGNOSIS — I10 Essential (primary) hypertension: Secondary | ICD-10-CM | POA: Insufficient documentation

## 2021-10-15 DIAGNOSIS — E11621 Type 2 diabetes mellitus with foot ulcer: Secondary | ICD-10-CM | POA: Insufficient documentation

## 2021-10-15 DIAGNOSIS — Z89431 Acquired absence of right foot: Secondary | ICD-10-CM | POA: Insufficient documentation

## 2021-10-15 DIAGNOSIS — M869 Osteomyelitis, unspecified: Secondary | ICD-10-CM | POA: Diagnosis not present

## 2021-10-15 NOTE — Progress Notes (Signed)
Rising, Darnice L. ([id number]) Visit Report for 10/15/2021 Arrival Information Details Patient Name: Date of Service: [name], [name] [name] 10/15/2021 12:30 PM Medical Record Number: [id number] Patient Account Number: [id number] Date of Birth/Sex: Treating RN: 08/21/1980 (41 y.o. [name] Primary Care Davyd Podgorski: Juluis Mire Other Clinician: Referring Anilah Huck: Treating Amanii Snethen/Extender: Edmonia Lynch in Treatment: 38 Visit Information History Since Last Visit Added or deleted any medications: No Patient Arrived: Gilford Rile Any new allergies or adverse reactions: No Arrival Time: 12:52 Had a fall or experienced change in No Transfer Assistance: None activities of daily living that may affect Patient Identification Verified: Yes risk of falls: Secondary Verification Process Completed: Yes Signs or symptoms of abuse/neglect since No Patient Requires Transmission-Based No last visito Precautions: Hospitalized since last visit: No Patient Has Alerts: Yes Implantable device outside of the clinic No Patient Alerts: Patient on Blood Thinner excluding PICC R Arm cellular tissue based products placed in the ABI 12/17/20 R=1.08 center L=1.13 since last visit: Has Dressing in Place as Prescribed: Yes Has Footwear/Offloading in Place as Yes Prescribed: Right: Surgical Shoe with Pressure Relief Insole Pain Present Now: No Electronic Signature(s) Signed: 10/15/2021 4:30:21 PM By: Lorrin Jackson Entered By: Lorrin Jackson on 10/15/2021 12:54:18 -------------------------------------------------------------------------------- Clinic Level of Care Assessment Details Patient Name: Date of Service: [name] V IS, [name] [name] 10/15/2021 12:30 PM Medical Record Number: [id number] Patient Account Number: [id number] Date of Birth/Sex: Treating RN: 03-06-1980 (41 y.o. [name] Primary Care Margarette Vannatter: Juluis Mire Other Clinician: Referring Locklan Canoy: Treating  Maranda Marte/Extender: Edmonia Lynch in Treatment: 38 Clinic Level of Care Assessment Items TOOL 4 Quantity Score X- 1 0 Use when only an EandM [name] ASSESSMENTS - Nursing Assessment / Reassessment X- 1 10 Reassessment of Co-morbidities (includes updates in patient status) X- 1 5 Reassessment of Adherence to Treatment Plan ASSESSMENTS - Wound and Skin A ssessment / Reassessment X - Simple Wound Assessment / Reassessment - one wound 1 5 []  - 0 Complex Wound Assessment / Reassessment - multiple wounds []  - 0 Dermatologic / Skin Assessment (not related to wound area) ASSESSMENTS - Focused Assessment []  - 0 Circumferential Edema Measurements - multi extremities []  - 0 Nutritional Assessment / Counseling / Intervention []  - 0 Lower Extremity Assessment (monofilament, tuning fork, pulses) []  - 0 Peripheral Arterial Disease Assessment (using hand held doppler) ASSESSMENTS - Ostomy and/or Continence Assessment and Care []  - 0 Incontinence Assessment and Management []  - 0 Ostomy Care Assessment and Management (repouching, etc.) PROCESS - Coordination of Care []  - 0 Simple Patient / Family Education for ongoing care X- 1 20 Complex (extensive) Patient / Family Education for ongoing care X- 1 10 Staff obtains Programmer, systems, Records, T Results / Process Orders est []  - 0 Staff telephones HHA, Nursing Homes / Clarify orders / etc []  - 0 Routine Transfer to another Facility (non-emergent condition) []  - 0 Routine Hospital Admission (non-emergent condition) []  - 0 New Admissions / Biomedical engineer / Ordering NPWT Apligraf, etc. , []  - 0 Emergency Hospital Admission (emergent condition) []  - 0 Simple Discharge Coordination []  - 0 Complex (extensive) Discharge Coordination PROCESS - Special Needs []  - 0 Pediatric / Minor Patient Management []  - 0 Isolation Patient Management []  - 0 Hearing / Language / Visual special  needs []  - 0 Assessment of Community assistance (transportation, D/C planning, etc.) []  - 0 Additional assistance / Altered mentation []  - 0 Support Surface(s) Assessment (bed, cushion, seat, etc.)  INTERVENTIONS - Wound Cleansing / Measurement X - Simple Wound Cleansing - one wound 1 5 []  - 0 Complex Wound Cleansing - multiple wounds X- 1 5 Wound Imaging (photographs - any number of wounds) []  - 0 Wound Tracing (instead of photographs) X- 1 5 Simple Wound Measurement - one wound []  - 0 Complex Wound Measurement - multiple wounds INTERVENTIONS - Wound Dressings X - Small Wound Dressing one or multiple wounds 1 10 []  - 0 Medium Wound Dressing one or multiple wounds []  - 0 Large Wound Dressing one or multiple wounds []  - 0 Application of Medications - topical []  - 0 Application of Medications - injection INTERVENTIONS - Miscellaneous []  - 0 External ear exam []  - 0 Specimen Collection (cultures, biopsies, blood, body fluids, etc.) []  - 0 Specimen(s) / Culture(s) sent or taken to Lab for analysis []  - 0 Patient Transfer (multiple staff / Civil Service fast streamer / Similar devices) []  - 0 Simple Staple / Suture removal (25 or less) []  - 0 Complex Staple / Suture removal (26 or more) []  - 0 Hypo / Hyperglycemic Management (close monitor of Blood Glucose) []  - 0 Ankle / Brachial Index (ABI) - do not check if billed separately X- 1 5 Vital Signs Has the patient been seen at the hospital within the last three years: Yes Total Score: 80 Level Of Care: New/Established - Level 3 Electronic Signature(s) Signed: 10/15/2021 4:30:21 PM By: Lorrin Jackson Entered By: Lorrin Jackson on 10/15/2021 13:14:08 -------------------------------------------------------------------------------- Encounter Discharge Information Details Patient Name: Date of Service: [name] V IS, [name] [name] 10/15/2021 12:30 PM Medical Record Number: [id number] Patient Account Number: [id number] Date of Birth/Sex: Treating  RN: 1980/05/31 (41 y.o. [name] Primary Care Girtie Wiersma: Juluis Mire Other Clinician: Referring Meyah Corle: Treating Slyvester Latona/Extender: Edmonia Lynch in Treatment: 38 Encounter Discharge Information Items Discharge Condition: Stable Ambulatory Status: Walker Discharge Destination: Home Transportation: Private Auto Schedule Follow-up Appointment: Yes Clinical Summary of Care: Provided on 10/15/2021 Form Type Recipient Paper Patient Patient Electronic Signature(s) Signed: 10/15/2021 4:30:21 PM By: Lorrin Jackson Entered By: Lorrin Jackson on 10/15/2021 13:15:10 -------------------------------------------------------------------------------- Lower Extremity Assessment Details Patient Name: Date of Service: [name] V IS, [name] [name] 10/15/2021 12:30 PM Medical Record Number: [id number] Patient Account Number: [id number] Date of Birth/Sex: Treating RN: 25-Oct-1980 (42 y.o. [name] Primary Care Verina Galeno: Juluis Mire Other Clinician: Referring Sameer Teeple: Treating Bunnie Lederman/Extender: Geryl Councilman Weeks in Treatment: 38 Edema Assessment Assessed: [Left: No] [Right: Yes] Edema: [Left: Ye] [Right: s] Calf Left: Right: Point of Measurement: 34 cm From Medial Instep 50.8 cm Ankle Left: Right: Point of Measurement: 8 cm From Medial Instep 26 cm Electronic Signature(s) Signed: 10/15/2021 4:30:21 PM By: Lorrin Jackson Entered By: Lorrin Jackson on 10/15/2021 13:01:04 -------------------------------------------------------------------------------- Multi Wound Chart Details Patient Name: Date of Service: [name] V IS, [name] [name] 10/15/2021 12:30 PM Medical Record Number: [id number] Patient Account Number: [id number] Date of Birth/Sex: Treating RN: [birth year] (41 y.o. F) Primary Care Jonita Hirota: Juluis Mire Other Clinician: Referring Jamerius Boeckman: Treating Arilla Hice/Extender: Edmonia Lynch in  Treatment: 38 Vital Signs Height(in): 63 Capillary Blood Glucose(mg/dl): 149 Weight(lbs): Pulse(bpm): 31 Body Mass Index(BMI): Blood Pressure(mmHg): 105/73 Temperature(F): 98.1 Respiratory Rate(breaths/min): 18 Photos: [N/A:N/A] Right, Medial Amputation Site - N/A N/A Wound Location: Transmetatarsal Surgical Injury N/A N/A Wounding Event: Dehisced Wound N/A N/A Primary Etiology: Hypertension, Type II Diabetes, N/A N/A Comorbid History: Osteomyelitis, Neuropathy 07/10/2021 N/A N/A Date Acquired: 56 N/A N/A Weeks of Treatment: Open N/A N/A Wound Status: No  N/A N/A Wound Recurrence: 0.3x0.3x4.8 N/A N/A Measurements L x W x D (cm) 0.071 N/A N/A A (cm) : rea 0.339 N/A N/A Volume (cm) : 39.80% N/A N/A % Reduction in Area: -868.60% N/A N/A % Reduction in Volume: Full Thickness Without Exposed N/A N/A Classification: Support Structures Small N/A N/A Exudate Amount: Purulent N/A N/A Exudate Type: yellow, brown, green N/A N/A Exudate Color: Distinct, outline attached N/A N/A Wound Margin: Large (67-100%) N/A N/A Granulation Amount: Red N/A N/A Granulation Quality: Small (1-33%) N/A N/A Necrotic Amount: Fat Layer (Subcutaneous Tissue): Yes N/A N/A Exposed Structures: Fascia: No Tendon: No Muscle: No Joint: No Bone: No Large (67-100%) N/A N/A Epithelialization: No Abnormalities Noted N/A N/A Periwound Skin Texture: Maceration: Yes N/A N/A Periwound Skin Moisture: Dry/Scaly: No No Abnormalities Noted N/A N/A Periwound Skin Color: No Abnormality N/A N/A Temperature: Treatment Notes Wound #2 (Amputation Site - Transmetatarsal) Wound Laterality: Right, Medial Cleanser Normal Saline Discharge Instruction: Cleanse the wound with Normal Saline prior to applying a clean dressing using gauze sponges, not tissue or cotton balls. Peri-Wound Care Topical Primary Dressing Plain packing strip 1/4 (in) Discharge Instruction: Lightly pack as  instructed Dakin's Solution 0.25%, 16 (oz) Discharge Instruction: Moisten gauze with Dakin's solution Secondary Dressing Woven Gauze Sponge, Non-Sterile 4x4 in Discharge Instruction: Apply over primary dressing as directed. Secured With Elastic Bandage 4 inch (ACE bandage) Discharge Instruction: Secure with ACE bandage as directed. Kerlix Roll Sterile, 4.5x3.1 (in/yd) Discharge Instruction: Secure with Kerlix as directed. Compression Wrap Compression Stockings Add-Ons Electronic Signature(s) Signed: 10/15/2021 1:44:59 PM By: Kalman Shan DO Entered By: Kalman Shan on 10/15/2021 13:32:30 -------------------------------------------------------------------------------- Multi-Disciplinary Care Plan Details Patient Name: Date of Service: [name] V IS, [name] [name] 10/15/2021 12:30 PM Medical Record Number: [id number] Patient Account Number: [id number] Date of Birth/Sex: Treating RN: Aug 22, 1980 (41 y.o. [name] Primary Care Daymon Hora: Juluis Mire Other Clinician: Referring Ah Bott: Treating Heidy Mccubbin/Extender: Edmonia Lynch in Treatment: 38 Active Inactive Nutrition Nursing Diagnoses: Impaired glucose control: actual or potential Goals: Patient/caregiver verbalizes understanding of need to maintain therapeutic glucose control per primary care physician Date Initiated: 01/22/2021 Target Resolution Date: 11/09/2021 Goal Status: Active Interventions: Assess HgA1c results as ordered upon admission and as needed Provide education on elevated blood sugars and impact on wound healing Treatment Activities: Obtain HgA1c : 01/22/2021 Notes: 07/10/21: Glucose control ongoing 09/11/21 : Glucose control ongoing, patient not compliant in checking glucose. Wound/Skin Impairment Nursing Diagnoses: Impaired tissue integrity Goals: Patient/caregiver will verbalize understanding of skin care regimen Date Initiated: 01/22/2021 Target Resolution Date:  11/09/2021 Goal Status: Active Ulcer/skin breakdown will have a volume reduction of 30% by week 4 Date Initiated: 01/22/2021 Date Inactivated: 03/19/2021 Target Resolution Date: 03/16/2021 Goal Status: Met Ulcer/skin breakdown will have a volume reduction of 50% by week 8 Date Initiated: 03/19/2021 Date Inactivated: 05/14/2021 Target Resolution Date: 04/16/2021 Unmet Reason: see wound Goal Status: Unmet measurements Interventions: Assess patient/caregiver ability to obtain necessary supplies Assess patient/caregiver ability to perform ulcer/skin care regimen upon admission and as needed Assess ulceration(s) every visit Provide education on ulcer and skin care Treatment Activities: Topical wound management initiated : 01/22/2021 Notes: 06/04/21: Wound vac started 07/10/21: Wound care regimen continues Electronic Signature(s) Signed: 10/15/2021 4:30:21 PM By: Lorrin Jackson Entered By: Lorrin Jackson on 10/15/2021 12:52:05 -------------------------------------------------------------------------------- Pain Assessment Details Patient Name: Date of Service: [name] [name], [name] [name] 10/15/2021 12:30 PM Medical Record Number: [id number] Patient Account Number: [id number] Date of Birth/Sex: Treating RN: 1980/03/16 (41 y.o. [name] Primary  Care Staphany Ditton: Juluis Mire Other Clinician: Referring Eydie Wormley: Treating Sharmeka Palmisano/Extender: Edmonia Lynch in Treatment: 38 Active Problems Location of Pain Severity and Description of Pain Patient Has Paino No Site Locations Pain Management and Medication Current Pain Management: Electronic Signature(s) Signed: 10/15/2021 4:30:21 PM By: Lorrin Jackson Entered By: Lorrin Jackson on 10/15/2021 13:00:45 -------------------------------------------------------------------------------- Patient/Caregiver Education Details Patient Name: Date of Service: [name] Judeen [name], [name] 10/2/2023andnbsp12:30 PM Medical Record Number:  [id number] Patient Account Number: [id number] Date of Birth/Gender: Treating RN: 1980-05-06 (41 y.o. [name] Primary Care Physician: Juluis Mire Other Clinician: Referring Physician: Treating Physician/Extender: Edmonia Lynch in Treatment: 60 Education Assessment Education Provided To: Patient Education Topics Provided Elevated Blood Sugar/ Impact on Healing: Methods: Explain/Verbal, Printed Responses: State content correctly Wound/Skin Impairment: Methods: Explain/Verbal, Printed Responses: State content correctly Electronic Signature(s) Signed: 10/15/2021 4:30:21 PM By: Lorrin Jackson Entered By: Lorrin Jackson on 10/15/2021 12:52:23 -------------------------------------------------------------------------------- Wound Assessment Details Patient Name: Date of Service: [name] [name], [name] [name] 10/15/2021 12:30 PM Medical Record Number: [id number] Patient Account Number: [id number] Date of Birth/Sex: Treating RN: Aug 14, 1980 (41 y.o. [name] Primary Care Arnol Mcgibbon: Juluis Mire Other Clinician: Referring Shreyansh Tiffany: Treating Shenequa Howse/Extender: Edmonia Lynch in Treatment: 38 Wound Status Wound Number: 2 Primary Etiology: Dehisced Wound Wound Location: Right, Medial Amputation Site - Transmetatarsal Wound Status: Open Wounding Event: Surgical Injury Comorbid Hypertension, Type II Diabetes, Osteomyelitis, History: Neuropathy Date Acquired: 07/10/2021 Weeks Of Treatment: 13 Clustered Wound: No Photos Wound Measurements Length: (cm) 0.3 Width: (cm) 0.3 Depth: (cm) 4.8 Area: (cm) 0.071 Volume: (cm) 0.339 % Reduction in Area: 39.8% % Reduction in Volume: -868.6% Epithelialization: Large (67-100%) Tunneling: No Undermining: No Wound Description Classification: Full Thickness Without Exposed Support Structures Wound Margin: Distinct, outline attached Exudate Amount: Small Exudate Type:  Purulent Exudate Color: yellow, brown, green Foul Odor After Cleansing: No Slough/Fibrino Yes Wound Bed Granulation Amount: Large (67-100%) Exposed Structure Granulation Quality: Red Fascia Exposed: No Necrotic Amount: Small (1-33%) Fat Layer (Subcutaneous Tissue) Exposed: Yes Necrotic Quality: Adherent Slough Tendon Exposed: No Muscle Exposed: No Joint Exposed: No Bone Exposed: No Periwound Skin Texture Texture Color No Abnormalities Noted: Yes No Abnormalities Noted: Yes Moisture Temperature / Pain No Abnormalities Noted: No Temperature: No Abnormality Dry / Scaly: No Maceration: Yes Treatment Notes Wound #2 (Amputation Site - Transmetatarsal) Wound Laterality: Right, Medial Cleanser Normal Saline Discharge Instruction: Cleanse the wound with Normal Saline prior to applying a clean dressing using gauze sponges, not tissue or cotton balls. Peri-Wound Care Topical Primary Dressing Plain packing strip 1/4 (in) Discharge Instruction: Lightly pack as instructed Dakin's Solution 0.25%, 16 (oz) Discharge Instruction: Moisten gauze with Dakin's solution Secondary Dressing Woven Gauze Sponge, Non-Sterile 4x4 in Discharge Instruction: Apply over primary dressing as directed. Secured With Elastic Bandage 4 inch (ACE bandage) Discharge Instruction: Secure with ACE bandage as directed. Kerlix Roll Sterile, 4.5x3.1 (in/yd) Discharge Instruction: Secure with Kerlix as directed. Compression Wrap Compression Stockings Add-Ons Electronic Signature(s) Signed: 10/15/2021 4:30:21 PM By: Lorrin Jackson Entered By: Lorrin Jackson on 10/15/2021 13:05:29 -------------------------------------------------------------------------------- Vitals Details Patient Name: Date of Service: [name] V IS, [name] [name] 10/15/2021 12:30 PM Medical Record Number: [id number] Patient Account Number: [id number] Date of Birth/Sex: Treating RN: [birth year] (41 y.o. [name] Primary Care Nikeshia Keetch:  Juluis Mire Other Clinician: Referring Keondre Markson: Treating Granvil Djordjevic/Extender: Edmonia Lynch in Treatment: 38 Vital Signs Time Taken: 12:54 Temperature (F): 98.1 Height (in): 69 Pulse (bpm): 74 Respiratory Rate (breaths/min): 18  Blood Pressure (mmHg): 105/73 Capillary Blood Glucose (mg/dl): 149 Reference Range: 80 - 120 mg / dl Electronic Signature(s) Signed: 10/15/2021 4:30:21 PM By: Lorrin Jackson Entered By: Lorrin Jackson on 10/15/2021 12:57:01

## 2021-10-15 NOTE — Progress Notes (Signed)
[name], [name] ([id number]) Visit Report for 10/15/2021 Chief Complaint Document Details Patient Name: Date of Service: [name], [place] [name] 10/15/2021 12:30 PM Medical Record Number: [id number] Patient Account Number: [id number] Date of Birth/Sex: Treating RN: 1980-01-16 (41 y.o. F) Primary Care Provider: Gwinda [name] Other Clinician: Referring Provider: Treating Provider/Extender: [name] in Treatment: 38 Information Obtained from: Patient Chief Complaint Osteomyelitis of the right foot status post transmetatarsal amputation with surgical site dehiscence Electronic Signature(s) Signed: 10/15/2021 1:44:59 PM By: [name] Corwin DO Entered By: [name] on 10/15/2021 13:32:37 -------------------------------------------------------------------------------- HPI Details Patient Name: Date of Service: [name], [name] [name] 10/15/2021 12:30 PM Medical Record Number: [id number] Patient Account Number: [id number] Date of Birth/Sex: Treating RN: 08/10/1980 (41 y.o. F) Primary Care Provider: Gwinda [name] Other Clinician: Referring Provider: Treating Provider/Extender: [name] in Treatment: 55 History of Present Illness HPI Description: Admission 01/22/2021 [name] a 41 year old female with a past medical history of insulin-dependent uncontrolled type 2 diabetes with last hemoglobin A1c of 13.5, osteomyelitis of the right foot status post transmetatarsal amputation on 12/18/2020 that presents to the clinic for right foot wound. She has had dehiscence of the surgical site. [name] using wet-to-dry dressings. She has a PICC line and receiving IV ceftriaxone daily for her osteomyelitis. There [name] an end date of 01/27/2021. [name] also taking oral metronidazole. She currently denies systemic signs of infection. 1/19; patient presents for follow-up. She was diagnosed with a DVT to the right lower extremity  2 days ago. [name] on Eliquis now. [name] scheduled to see her infectious disease doctor tomorrow. She has been using Dakin's wet-to-dry dressings. She denies systemic signs of infection. 1/26; patient presents for follow-up. She saw infectious disease on 1/21 started on Augmentin. Her PICC line and IV ceftriaxone was discontinued. Patient reports stability to her wound. She has been using Dakin's wet-to-dry dressings. She currently denies systemic signs of infection. 2/3; patient presents for follow-up. She continues to use Dakin's wet-to-dry dressings to the wound bed. She saw [name] with infectious disease yesterday and [name] continuing Augmentin. T entative end date [name] 2/16. Patient reports following up with orthopedics. She states there [name] no further plan from them. She currently denies systemic signs of infection. 2/10; patient presents for follow-up. She continues to use Dakin's wet to dry dressings. [name] scheduled to have her MRI done on 2/14. She states that she had pain to the debridement site from last clinic visit and declines debridement today. She denies systemic signs of infection. She continues to have yellow thick drainage. 2/20; patient presents for follow-up. She continues to use Dakin's wet-to-dry dressings. She obtained her MRI. The results showed an abscess and [name] scheduled to see her orthopedic surgeon on 2/23. She saw infectious disease 2/17 and her antibiotics were extended. She currently denies systemic signs of infection. 3/6; patient presents for follow-up. She had debridement and irrigation of her foot on 03/10/2021 due to abscess noted on MRI. She was started on IV ceftriaxone and oral Flagyl. She has no issues or complaints today. She has been using iodoform packing to the tunnel and Dakin's wet-to-dry to the opening. 03/26/2021: She continues on IV ceftriaxone and oral metronidazole. She has follow-up with infectious disease tomorrow. No significant issues or  complaints today. Her mother continues to help her with her wound dressing, using iodoform packing strips into the tunnel and Dakin's to the open portion of the  wound. 3/20; patient presents for follow-up. She continues to be on IV ceftriaxone in oral metronidazole. She has been using iodoform to the tunnel and Dakin's wet-to- dry to the open wound. She denies signs of infection. 3/27; patient presents for follow-up. She no longer has a PICC line. She has been using iodoform to the tunnel and Dakin's wet-to-dry to the open wound. She reports improvement in wound healing. She denies signs of infection. 4/3; patient presents for follow-up. She states she has been using Hydrofera Blue to the open wound and iodoform packing to the tunnel without any issues. She denies signs of infection. 4/18; patient presents for follow-up. She saw infectious disease on 4/11. She has finished her oral antibiotics and completed a total of 6 weeks of antibiotics (this includes IV as well). No further antibiotics needed. She has been using Hydrofera Blue and iodoform packing. She states that the tunneled area has come in and the iodoform [name] not staying in place anymore. She has no issues or complaints today. She denies signs of infection. 4/24; patient presents for follow-up. She saw [name], plastic surgery to discuss potential skin graft/substitute placement. At this time he thinks that the skin graft would likely not take. He [name] in agreement with trying a wound VAC. Patient has been using Hydrofera Blue dressing changes with no issues. She denies signs of infection. She reports improvement in wound healing. 5/1; patient presents for follow-up. Unfortunately patient did not have insurance when we ran for the [place]. There [name] an assistance program and we are trying to get this accommodated for the patient. In the meantime she has been using Hydrofera Blue without any issues. She denies signs of infection. 5/8; patient  presents for follow-up. We have not heard back if pico [name] covered by her insurance. She has been using collagen to the wound bed over the past week. She denies signs of infection. 5/18; patient presents for follow-up. She has been using collagen to the wound bed without issues. Again we have not heard if pico [name] covered by her insurance. She has no issues or complaints today. 5/23; patient presents for follow-up. She has been using collagen to the wound bed. She has no issues or complaints today. She obtained the wound VAC from [facility] and brought this in today. She denies signs of infection. 6/1; patient presents for follow-up. She has been using the wound VAC for the past week. She has had this changed twice since she was last here. She reports more maceration to the periwound. She denies signs of infection. 6/7; right TMA site. There are 2 wounds 0 separated by a bridge of healed tissue. The more lateral area has undermining. Both areas have healthy looking granulation at the base but relative the size of the wound [name] fairly deep. There [name] no exposed bone no evidence of infection. Her wound VAC was put on hold last week because of surrounding skin maceration she has been using collagen this week. She has a modified shoe 6/12; patient presents for follow-up. Last week the wound VAC was reinitiated. She had no issues with the wound VAC itself. Today she has maceration again noted to the surrounding skin. She denies signs of infection. 6/27; patient presents for follow-up. She has been using Medihoney to the wound bed. We took a break from the wound VAC because the periwound was macerated. She still has some areas of maceration to the distal foot where there [name] a callus. She currently denies signs of infection.  7/11; patient presents for follow-up. She has been using Medihoney to the wound bed. She has developed some increased warmth and erythema to the lateral aspect of the right foot. She states this  [name] occurred over the past week and there [name] increased pain. No drainage noted. 7/17; patient presents for follow-up. She has been using Medihoney to the wound bed. She completed her course of Bactrim. She reports improvement in symptoms. 7/24; Patient presents for follow up. She has been using Medihoney to the wound bed without issues. She completed another course of Bactrim. She reports improvement in her symptoms but still has some mild tenderness to the medial aspect of the foot. 7/31; Patient presents for follow-up. She has been using Medihoney and Dakin's to the wound bed. She denies signs of infection. 8/14; patient presents for follow-up. She has been using Dakin's wet-to-dry packing strips to the right medial aspect of the amputation site and Medihoney to the anterior site. She has no issues or complaints today. She has started physical therapy. She denies signs of infection. 8/29; patient presents for follow-up. She has been using Dakin's wet-to-dry packing strips to the right medial aspect of the amputation site however this [name] becoming more difficult to place. She did report that she had increased redness and swelling to that site and developed some drainage. It has resolved. She continues with physical therapy. 9/8; patient presents for follow-up. We have been using silver alginate to the tunneled area. She completed her course of antibiotics. She reports no pain, increased swelling or erythema. 9/21; patient presents for follow-up. She has been using gentamicin to the tunneled area. She has no issues or complaints today. She denies signs of infection. 10/2; patient presents for follow-up. [name] scheduled to have her CT scan on 10/9. She currently denies signs of infection. She denies increased warmth, erythema or purulent drainage from the wound bed. She has been using Dakin's wet-to-dry dressings. Electronic Signature(s) Signed: 10/15/2021 1:44:59 PM By: [name] Corwin DO Entered  By: [name] on 10/15/2021 13:35:13 -------------------------------------------------------------------------------- Physical Exam Details Patient Name: Date of Service: [name], [name] [name] 10/15/2021 12:30 PM Medical Record Number: [id number] Patient Account Number: [id number] Date of Birth/Sex: Treating RN: 08-14-1980 (41 y.o. F) Primary Care Provider: Gwinda [name] Other Clinician: Referring Provider: Treating Provider/Extender: [name] in Treatment: 38 Constitutional respirations regular, non-labored and within target range for patient.. Cardiovascular 2+ dorsalis pedis/posterior tibialis pulses. Psychiatric pleasant and cooperative. Notes Right foot: T the transmetatarsal amputation site there [name] callus to the entire section except for the medial aspect where there [name] tunneling. No increased o warmth erythema or purulent drainage noted. Electronic Signature(s) Signed: 10/15/2021 1:44:59 PM By: [name] Corwin DO Entered By: [name] on 10/15/2021 13:35:54 -------------------------------------------------------------------------------- Physician Orders Details Patient Name: Date of Service: [name], [name] [name] 10/15/2021 12:30 PM Medical Record Number: [id number] Patient Account Number: [id number] Date of Birth/Sex: Treating RN: 1980/04/30 (41 y.o. [name] Primary Care Provider: Gwinda [name] Other Clinician: Referring Provider: Treating Provider/Extender: [name] in Treatment: 34 Verbal / Phone Orders: No Diagnosis Coding ICD-10 Coding Code Description L97.514 Non-pressure chronic ulcer of other part of right foot with necrosis of bone E11.621 Type 2 diabetes mellitus with foot ulcer M86.9 Osteomyelitis, unspecified Follow-up Appointments ppointment in 1 week. - Thursday, with Dr. Mikey Bussing and Lennox Laity, Room 7 Return A Anesthetic (In clinic) Topical Lidocaine 5% applied to  wound bed (In clinic) Topical Lidocaine 4% applied to  wound bed Bathing/ Shower/ Hygiene Other Bathing/Shower/Hygiene Orders/Instructions: - Clean with Saline or Dakins Edema Control - Lymphedema / SCD / Other Elevate legs to the level of the heart or above for 30 minutes daily and/or when sitting, a frequency of: - throughout the day void standing for long periods of time. - Limit time/pressure on feet, reduce PT Use wheelchair instead of walker when you can. . A Moisturize legs daily. Off-Loading Open toe surgical shoe to: - surgical shoe: reduce pressure when walking Additional Orders / Instructions Follow Nutritious Diet - -Monitor/Control Blood Sugar -High Protein Diet Wound Treatment Wound #2 - Amputation Site - Transmetatarsal Wound Laterality: Right, Medial Cleanser: Normal Saline (Generic) 1 x Per Day/30 Days Discharge Instructions: Cleanse the wound with Normal Saline prior to applying a clean dressing using gauze sponges, not tissue or cotton balls. Prim Dressing: Plain packing strip 1/4 (in) ary 1 x Per Day/30 Days Discharge Instructions: Lightly pack as instructed Prim Dressing: Dakin's Solution 0.25%, 16 (oz) 1 x Per Day/30 Days ary Discharge Instructions: Moisten gauze with Dakin's solution Secondary Dressing: Woven Gauze Sponge, Non-Sterile 4x4 in 1 x Per Day/30 Days Discharge Instructions: Apply over primary dressing as directed. Secured With: Elastic Bandage 4 inch (ACE bandage) (Generic) 1 x Per Day/30 Days Discharge Instructions: Secure with ACE bandage as directed. Secured With: [employer], 4.5x3.1 (in/yd) (Generic) 1 x Per Day/30 Days Discharge Instructions: Secure with Kerlix as directed. Electronic Signature(s) Signed: 10/15/2021 1:44:59 PM By: [name] Corwin DO Entered By: [name] on 10/15/2021 13:36:15 -------------------------------------------------------------------------------- Problem List Details Patient Name: Date of  Service: [name], [name] [name] 10/15/2021 12:30 PM Medical Record Number: [id number] Patient Account Number: [id number] Date of Birth/Sex: Treating RN: 08-25-1980 (41 y.o. [name] Primary Care Provider: Gwinda [name] Other Clinician: Referring Provider: Treating Provider/Extender: [name] in Treatment: 38 Active Problems ICD-10 Encounter Code Description Active Date MDM Diagnosis L97.514 Non-pressure chronic ulcer of other part of right foot with necrosis of bone 01/22/2021 No Yes E11.621 Type 2 diabetes mellitus with foot ulcer 01/22/2021 No Yes M86.9 Osteomyelitis, unspecified 01/22/2021 No Yes Inactive Problems Resolved Problems Electronic Signature(s) Signed: 10/15/2021 1:44:59 PM By: [name] Corwin DO Entered By: [name] on 10/15/2021 13:32:23 -------------------------------------------------------------------------------- Progress Note Details Patient Name: Date of Service: [name], [name] [name] 10/15/2021 12:30 PM Medical Record Number: [id number] Patient Account Number: [id number] Date of Birth/Sex: Treating RN: 01-04-1981 (41 y.o. F) Primary Care Provider: Gwinda [name] Other Clinician: Referring Provider: Treating Provider/Extender: [name] in Treatment: 38 Subjective Chief Complaint Information obtained from Patient Osteomyelitis of the right foot status post transmetatarsal amputation with surgical site dehiscence History of Present Illness (HPI) Admission 01/22/2021 [name] a 41 year old female with a past medical history of insulin-dependent uncontrolled type 2 diabetes with last hemoglobin A1c of 13.5, osteomyelitis of the right foot status post transmetatarsal amputation on 12/18/2020 that presents to the clinic for right foot wound. She has had dehiscence of the surgical site. [name] using wet-to-dry dressings. She has a PICC line and receiving IV ceftriaxone  daily for her osteomyelitis. There [name] an end date of 01/27/2021. [name] also taking oral metronidazole. She currently denies systemic signs of infection. 1/19; patient presents for follow-up. She was diagnosed with a DVT to the right lower extremity 2 days ago. [name] on Eliquis now. [name] scheduled to see her infectious disease doctor tomorrow. She has been using Dakin's wet-to-dry dressings. She  denies systemic signs of infection. 1/26; patient presents for follow-up. She saw infectious disease on 1/21 started on Augmentin. Her PICC line and IV ceftriaxone was discontinued. Patient reports stability to her wound. She has been using Dakin's wet-to-dry dressings. She currently denies systemic signs of infection. 2/3; patient presents for follow-up. She continues to use Dakin's wet-to-dry dressings to the wound bed. She saw Dr. Manson PasseyMandahar with infectious disease yesterday and [name] continuing Augmentin. T entative end date [name] 2/16. Patient reports following up with orthopedics. She states there [name] no further plan from them. She currently denies systemic signs of infection. 2/10; patient presents for follow-up. She continues to use Dakin's wet to dry dressings. [name] scheduled to have her MRI done on 2/14. She states that she had pain to the debridement site from last clinic visit and declines debridement today. She denies systemic signs of infection. She continues to have yellow thick drainage. 2/20; patient presents for follow-up. She continues to use Dakin's wet-to-dry dressings. She obtained her MRI. The results showed an abscess and [name] scheduled to see her orthopedic surgeon on 2/23. She saw infectious disease 2/17 and her antibiotics were extended. She currently denies systemic signs of infection. 3/6; patient presents for follow-up. She had debridement and irrigation of her foot on 03/10/2021 due to abscess noted on MRI. She was started on IV ceftriaxone and oral Flagyl. She has no issues or  complaints today. She has been using iodoform packing to the tunnel and Dakin's wet-to-dry to the opening. 03/26/2021: She continues on IV ceftriaxone and oral metronidazole. She has follow-up with infectious disease tomorrow. No significant issues or complaints today. Her mother continues to help her with her wound dressing, using iodoform packing strips into the tunnel and Dakin's to the open portion of the wound. 3/20; patient presents for follow-up. She continues to be on IV ceftriaxone in oral metronidazole. She has been using iodoform to the tunnel and Dakin's wet-to- dry to the open wound. She denies signs of infection. 3/27; patient presents for follow-up. She no longer has a PICC line. She has been using iodoform to the tunnel and Dakin's wet-to-dry to the open wound. She reports improvement in wound healing. She denies signs of infection. 4/3; patient presents for follow-up. She states she has been using Hydrofera Blue to the open wound and iodoform packing to the tunnel without any issues. She denies signs of infection. 4/18; patient presents for follow-up. She saw infectious disease on 4/11. She has finished her oral antibiotics and completed a total of 6 weeks of antibiotics (this includes IV as well). No further antibiotics needed. She has been using Hydrofera Blue and iodoform packing. She states that the tunneled area has come in and the iodoform [name] not staying in place anymore. She has no issues or complaints today. She denies signs of infection. 4/24; patient presents for follow-up. She saw Dr. Carlene CoriaLubin's, plastic surgery to discuss potential skin graft/substitute placement. At this time he thinks that the skin graft would likely not take. He [name] in agreement with trying a wound VAC. Patient has been using Hydrofera Blue dressing changes with no issues. She denies signs of infection. She reports improvement in wound healing. 5/1; patient presents for follow-up. Unfortunately patient did  not have insurance when we ran [place] the [place]. There [name] an assistance program and we are trying to get this accommodated for the patient. In the meantime she has been using Hydrofera Blue without any issues. She denies signs of infection. 5/8;  patient presents for follow-up. We have not heard back if pico [name] covered by her insurance. She has been using collagen to the wound bed over the past week. She denies signs of infection. 5/18; patient presents for follow-up. She has been using collagen to the wound bed without issues. Again we have not heard if pico [name] covered by her insurance. She has no issues or complaints today. 5/23; patient presents for follow-up. She has been using collagen to the wound bed. She has no issues or complaints today. She obtained the wound VAC from [facility] and brought this in today. She denies signs of infection. 6/1; patient presents for follow-up. She has been using the wound VAC for the past week. She has had this changed twice since she was last here. She reports more maceration to the periwound. She denies signs of infection. 6/7; right TMA site. There are 2 wounds 0 separated by a bridge of healed tissue. The more lateral area has undermining. Both areas have healthy looking granulation at the base but relative the size of the wound [name] fairly deep. There [name] no exposed bone no evidence of infection. Her wound VAC was put on hold last week because of surrounding skin maceration she has been using collagen this week. She has a modified shoe 6/12; patient presents for follow-up. Last week the wound VAC was reinitiated. She had no issues with the wound VAC itself. Today she has maceration again noted to the surrounding skin. She denies signs of infection. 6/27; patient presents for follow-up. She has been using Medihoney to the wound bed. We took a break from the wound VAC because the periwound was macerated. She still has some areas of maceration to the distal foot where there  [name] a callus. She currently denies signs of infection. 7/11; patient presents for follow-up. She has been using Medihoney to the wound bed. She has developed some increased warmth and erythema to the lateral aspect of the right foot. She states this [name] occurred over the past week and there [name] increased pain. No drainage noted. 7/17; patient presents for follow-up. She has been using Medihoney to the wound bed. She completed her course of Bactrim. She reports improvement in symptoms. 7/24; Patient presents for follow up. She has been using Medihoney to the wound bed without issues. She completed another course of Bactrim. She reports improvement in her symptoms but still has some mild tenderness to the medial aspect of the foot. 7/31; Patient presents for follow-up. She has been using Medihoney and Dakin's to the wound bed. She denies signs of infection. 8/14; patient presents for follow-up. She has been using Dakin's wet-to-dry packing strips to the right medial aspect of the amputation site and Medihoney to the anterior site. She has no issues or complaints today. She has started physical therapy. She denies signs of infection. 8/29; patient presents for follow-up. She has been using Dakin's wet-to-dry packing strips to the right medial aspect of the amputation site however this [name] becoming more difficult to place. She did report that she had increased redness and swelling to that site and developed some drainage. It has resolved. She continues with physical therapy. 9/8; patient presents for follow-up. We have been using silver alginate to the tunneled area. She completed her course of antibiotics. She reports no pain, increased swelling or erythema. 9/21; patient presents for follow-up. She has been using gentamicin to the tunneled area. She has no issues or complaints today. She denies signs of infection. 10/2; patient  presents for follow-up. [name] scheduled to have her CT scan on 10/9. She  currently denies signs of infection. She denies increased warmth, erythema or purulent drainage from the wound bed. She has been using Dakin's wet-to-dry dressings. Patient History Information obtained from Patient. Family History Cancer - Paternal Grandparents, Diabetes - Mother, Hypertension - Mother, Stroke - Maternal Grandparents, No family history of Heart Disease, Hereditary Spherocytosis, Kidney Disease, Lung Disease, Seizures, Thyroid Problems, Tuberculosis. Social History Never smoker, Marital Status - Single, Alcohol Use - Rarely, Drug Use - Prior History - Marijuana, Caffeine Use - Daily. Medical History Cardiovascular Patient has history of Hypertension Endocrine Patient has history of Type II Diabetes Musculoskeletal Patient has history of Osteomyelitis - Right Transmet 12/18/20 Neurologic Patient has history of Neuropathy Objective Constitutional respirations regular, non-labored and within target range for patient.. Vitals Time Taken: 12:54 PM, Height: 69 in, Temperature: 98.1 F, Pulse: 74 bpm, Respiratory Rate: 18 breaths/min, Blood Pressure: 105/73 mmHg, Capillary Blood Glucose: 149 mg/dl. Cardiovascular 2+ dorsalis pedis/posterior tibialis pulses. Psychiatric pleasant and cooperative. General Notes: Right foot: T the transmetatarsal amputation site there [name] callus to the entire section except for the medial aspect where there [name] tunneling. No o increased warmth erythema or purulent drainage noted. Integumentary (Hair, Skin) Wound #2 status [name] Open. Original cause of wound was Surgical Injury. The date acquired was: 07/10/2021. The wound has been in treatment 13 weeks. The wound [name] located on the Right,Medial Amputation Site - Transmetatarsal. The wound measures 0.3cm length x 0.3cm width x 4.8cm depth; 0.071cm^2 area and 0.339cm^3 volume. There [name] Fat Layer (Subcutaneous Tissue) exposed. There [name] no tunneling or undermining noted. There [name] a small amount of  purulent drainage noted. The wound margin [name] distinct with the outline attached to the wound base. There [name] large (67-100%) red granulation within the wound bed. There [name] a small (1-33%) amount of necrotic tissue within the wound bed including Adherent Slough. The periwound skin appearance had no abnormalities noted for texture. The periwound skin appearance had no abnormalities noted for color. The periwound skin appearance exhibited: Maceration. The periwound skin appearance did not exhibit: Dry/Scaly. Periwound temperature was noted as No Abnormality. Assessment Active Problems ICD-10 Non-pressure chronic ulcer of other part of right foot with necrosis of bone Type 2 diabetes mellitus with foot ulcer Osteomyelitis, unspecified Patient's tunneled wound [name] overall stable. No signs of surrounding infection. [name] scheduled to have her CT scan on 10/9. This will help better assess this area. This has stalled over several weeks and healing. I recommended for now continuing to aggressively offload the area and Dakin's wet-to-dry dressings. Plan Follow-up Appointments: Return Appointment in 1 week. - Thursday, with Dr. Mikey Bussing and Lennox Laity, Room 7 Anesthetic: (In clinic) Topical Lidocaine 5% applied to wound bed (In clinic) Topical Lidocaine 4% applied to wound bed Bathing/ Shower/ Hygiene: Other Bathing/Shower/Hygiene Orders/Instructions: - Clean with Saline or Dakins Edema Control - Lymphedema / SCD / Other: Elevate legs to the level of the heart or above for 30 minutes daily and/or when sitting, a frequency of: - throughout the day Avoid standing for long periods of time. - Limit time/pressure on feet, reduce PT Use wheelchair instead of walker when you can. . Moisturize legs daily. Off-Loading: Open toe surgical shoe to: - surgical shoe: reduce pressure when walking Additional Orders / Instructions: Follow Nutritious Diet - -Monitor/Control Blood Sugar -High Protein Diet WOUND #2: -  Amputation Site - Transmetatarsal Wound Laterality: Right, Medial Cleanser: Normal Saline (Generic) 1 x  Per Day/30 Days Discharge Instructions: Cleanse the wound with Normal Saline prior to applying a clean dressing using gauze sponges, not tissue or cotton balls. Prim Dressing: Plain packing strip 1/4 (in) 1 x Per Day/30 Days ary Discharge Instructions: Lightly pack as instructed Prim Dressing: Dakin's Solution 0.25%, 16 (oz) 1 x Per Day/30 Days ary Discharge Instructions: Moisten gauze with Dakin's solution Secondary Dressing: Woven Gauze Sponge, Non-Sterile 4x4 in 1 x Per Day/30 Days Discharge Instructions: Apply over primary dressing as directed. Secured With: Elastic Bandage 4 inch (ACE bandage) (Generic) 1 x Per Day/30 Days Discharge Instructions: Secure with ACE bandage as directed. Secured With: [employer], 4.5x3.1 (in/yd) (Generic) 1 x Per Day/30 Days Discharge Instructions: Secure with Kerlix as directed. 1. Dakin's wet-to-dry dressings 2. Follow-up in 1 week Electronic Signature(s) Signed: 10/15/2021 1:44:59 PM By: [name] Corwin DO Entered By: [name] on 10/15/2021 13:36:54 -------------------------------------------------------------------------------- HxROS Details Patient Name: Date of Service: [name], [name] [name] 10/15/2021 12:30 PM Medical Record Number: [id number] Patient Account Number: [id number] Date of Birth/Sex: Treating RN: [birth year] (41 y.o. F) Primary Care Provider: Gwinda [name] Other Clinician: Referring Provider: Treating Provider/Extender: [name] in Treatment: 38 Information Obtained From Patient Cardiovascular Medical History: Positive for: Hypertension Endocrine Medical History: Positive for: Type II Diabetes Time with diabetes: Dx 2009 Treated with: Insulin, Oral agents Blood sugar tested every day: Yes Tested : daily Musculoskeletal Medical History: Positive for: Osteomyelitis -  Right Transmet 12/18/20 Neurologic Medical History: Positive for: Neuropathy Immunizations Pneumococcal Vaccine: Received Pneumococcal Vaccination: No Implantable Devices Yes Family and Social History Cancer: Yes - Paternal Grandparents; Diabetes: Yes - Mother; Heart Disease: No; Hereditary Spherocytosis: No; Hypertension: Yes - Mother; Kidney Disease: No; Lung Disease: No; Seizures: No; Stroke: Yes - Maternal Grandparents; Thyroid Problems: No; Tuberculosis: No; Never smoker; Marital Status - Single; Alcohol Use: Rarely; Drug Use: Prior History - Marijuana; Caffeine Use: Daily; Financial Concerns: No; Food, Clothing or Shelter Needs: No; Support System Lacking: No; Transportation Concerns: No Electronic Signature(s) Signed: 10/15/2021 1:44:59 PM By: [name] Corwin DO Entered By: [name] on 10/15/2021 13:35:20 -------------------------------------------------------------------------------- SuperBill Details Patient Name: Date of Service: [name], [name] [name] 10/15/2021 Medical Record Number: [id number] Patient Account Number: [id number] Date of Birth/Sex: Treating RN: August 18, 1980 (41 y.o. [name] Primary Care Provider: Gwinda [name] Other Clinician: Referring Provider: Treating Provider/Extender: [name] in Treatment: 38 Diagnosis Coding ICD-10 Codes Code Description [phone] Non-pressure chronic ulcer of other part of right foot with necrosis of bone E11.621 Type 2 diabetes mellitus with foot ulcer M86.9 Osteomyelitis, unspecified Facility Procedures CPT4 Code: [id number] Description: 99213 - WOUND CARE VISIT-LEV 3 EST PT Modifier: Quantity: 1 Physician Procedures Electronic Signature(s) Signed: 10/15/2021 1:44:59 PM By: [name] Corwin DO Entered By: [name] on 10/15/2021 13:37:08

## 2021-10-17 ENCOUNTER — Encounter (INDEPENDENT_AMBULATORY_CARE_PROVIDER_SITE_OTHER): Payer: Medicaid Other | Admitting: Primary Care

## 2021-10-17 ENCOUNTER — Other Ambulatory Visit (INDEPENDENT_AMBULATORY_CARE_PROVIDER_SITE_OTHER): Payer: Medicaid Other

## 2021-10-17 ENCOUNTER — Other Ambulatory Visit (INDEPENDENT_AMBULATORY_CARE_PROVIDER_SITE_OTHER): Payer: Self-pay

## 2021-10-17 DIAGNOSIS — Z1159 Encounter for screening for other viral diseases: Secondary | ICD-10-CM

## 2021-10-17 DIAGNOSIS — E1165 Type 2 diabetes mellitus with hyperglycemia: Secondary | ICD-10-CM

## 2021-10-18 ENCOUNTER — Other Ambulatory Visit: Payer: Self-pay

## 2021-10-18 ENCOUNTER — Telehealth (INDEPENDENT_AMBULATORY_CARE_PROVIDER_SITE_OTHER): Payer: Self-pay

## 2021-10-18 ENCOUNTER — Other Ambulatory Visit (INDEPENDENT_AMBULATORY_CARE_PROVIDER_SITE_OTHER): Payer: Self-pay | Admitting: Primary Care

## 2021-10-18 DIAGNOSIS — E782 Mixed hyperlipidemia: Secondary | ICD-10-CM

## 2021-10-18 LAB — CBC WITH DIFFERENTIAL/PLATELET
Basophils Absolute: 0 10*3/uL (ref 0.0–0.2)
Basos: 0 %
EOS (ABSOLUTE): 0.1 10*3/uL (ref 0.0–0.4)
Eos: 1 %
Hematocrit: 39.1 % (ref 34.0–46.6)
Hemoglobin: 12.3 g/dL (ref 11.1–15.9)
Immature Grans (Abs): 0 10*3/uL (ref 0.0–0.1)
Immature Granulocytes: 0 %
Lymphocytes Absolute: 1.8 10*3/uL (ref 0.7–3.1)
Lymphs: 23 %
MCH: 25.9 pg — ABNORMAL LOW (ref 26.6–33.0)
MCHC: 31.5 g/dL (ref 31.5–35.7)
MCV: 83 fL (ref 79–97)
Monocytes Absolute: 0.5 10*3/uL (ref 0.1–0.9)
Monocytes: 7 %
Neutrophils Absolute: 5.3 10*3/uL (ref 1.4–7.0)
Neutrophils: 69 %
Platelets: 255 10*3/uL (ref 150–450)
RBC: 4.74 x10E6/uL (ref 3.77–5.28)
RDW: 14.3 % (ref 11.7–15.4)
WBC: 7.7 10*3/uL (ref 3.4–10.8)

## 2021-10-18 LAB — LIPID PANEL
Chol/HDL Ratio: 3.8 ratio (ref 0.0–4.4)
Cholesterol, Total: 210 mg/dL — ABNORMAL HIGH (ref 100–199)
HDL: 56 mg/dL (ref >39)
LDL Chol Calc (NIH): 126 mg/dL — ABNORMAL HIGH (ref 0–99)
Triglycerides: 159 mg/dL — ABNORMAL HIGH (ref 0–149)
VLDL Cholesterol Cal: 28 mg/dL (ref 5–40)

## 2021-10-18 LAB — CMP14+EGFR
ALT: 7 IU/L (ref 0–32)
AST: 13 IU/L (ref 0–40)
Albumin/Globulin Ratio: 1.4 (ref 1.2–2.2)
Albumin: 4.1 g/dL (ref 3.9–4.9)
Alkaline Phosphatase: 121 IU/L (ref 44–121)
BUN/Creatinine Ratio: 8 — ABNORMAL LOW (ref 9–23)
BUN: 6 mg/dL (ref 6–24)
Bilirubin Total: 0.3 mg/dL (ref 0.0–1.2)
CO2: 24 mmol/L (ref 20–29)
Calcium: 9.3 mg/dL (ref 8.7–10.2)
Chloride: 97 mmol/L (ref 96–106)
Creatinine, Ser: 0.72 mg/dL (ref 0.57–1.00)
Globulin, Total: 2.9 g/dL (ref 1.5–4.5)
Glucose: 159 mg/dL — ABNORMAL HIGH (ref 70–99)
Potassium: 4.3 mmol/L (ref 3.5–5.2)
Sodium: 140 mmol/L (ref 134–144)
Total Protein: 7 g/dL (ref 6.0–8.5)
eGFR: 108 mL/min/{1.73_m2} (ref >59)

## 2021-10-18 LAB — SPECIMEN STATUS REPORT

## 2021-10-18 LAB — HCV INTERPRETATION

## 2021-10-18 LAB — HCV AB W REFLEX TO QUANT PCR: HCV Ab: NONREACTIVE

## 2021-10-18 MED ORDER — ATORVASTATIN CALCIUM 80 MG PO TABS
80.0000 mg | ORAL_TABLET | Freq: Every day | ORAL | 3 refills | Status: DC
  Filled 2021-10-18 – 2021-11-01 (×2): qty 90, 90d supply, fill #0
  Filled 2022-06-26: qty 90, 90d supply, fill #1

## 2021-10-18 NOTE — Telephone Encounter (Signed)
Contacted pt to go over lab results pt is aware and doesn't have any questions or concerns 

## 2021-10-21 NOTE — Progress Notes (Signed)
Change to lab visit only recently seen by the clinical pharmacist A1c was done and medications adjusted.

## 2021-10-22 ENCOUNTER — Ambulatory Visit (HOSPITAL_BASED_OUTPATIENT_CLINIC_OR_DEPARTMENT_OTHER)
Admission: RE | Admit: 2021-10-22 | Discharge: 2021-10-22 | Disposition: A | Discharge status: Home / Self Care | Payer: Medicaid Other | Source: Ambulatory Visit | Attending: Internal Medicine | Admitting: Internal Medicine

## 2021-10-22 DIAGNOSIS — E13621 Other specified diabetes mellitus with foot ulcer: Secondary | ICD-10-CM | POA: Insufficient documentation

## 2021-10-22 DIAGNOSIS — L98499 Non-pressure chronic ulcer of skin of other sites with unspecified severity: Secondary | ICD-10-CM | POA: Insufficient documentation

## 2021-10-22 DIAGNOSIS — L97509 Non-pressure chronic ulcer of other part of unspecified foot with unspecified severity: Secondary | ICD-10-CM | POA: Diagnosis present

## 2021-10-22 MED ORDER — IOHEXOL 300 MG/ML  SOLN
100.0000 mL | Freq: Once | INTRAMUSCULAR | Status: AC | PRN
  Administered 2021-10-22: 80 mL via INTRAVENOUS

## 2021-10-23 ENCOUNTER — Other Ambulatory Visit: Payer: Self-pay

## 2021-10-24 ENCOUNTER — Other Ambulatory Visit: Payer: Self-pay

## 2021-10-25 ENCOUNTER — Inpatient Hospital Stay (HOSPITAL_COMMUNITY)
Admission: EM | Admit: 2021-10-25 | Discharge: 2021-10-31 | DRG: 501 | Disposition: A | Discharge status: Home / Self Care | Payer: Medicaid Other | Attending: Internal Medicine | Admitting: Internal Medicine

## 2021-10-25 ENCOUNTER — Encounter (HOSPITAL_COMMUNITY): Payer: Self-pay

## 2021-10-25 ENCOUNTER — Encounter (HOSPITAL_BASED_OUTPATIENT_CLINIC_OR_DEPARTMENT_OTHER): Payer: Medicaid Other | Admitting: Internal Medicine

## 2021-10-25 ENCOUNTER — Other Ambulatory Visit: Payer: Self-pay

## 2021-10-25 DIAGNOSIS — M869 Osteomyelitis, unspecified: Secondary | ICD-10-CM

## 2021-10-25 DIAGNOSIS — Z86718 Personal history of other venous thrombosis and embolism: Secondary | ICD-10-CM

## 2021-10-25 DIAGNOSIS — I10 Essential (primary) hypertension: Secondary | ICD-10-CM | POA: Diagnosis present

## 2021-10-25 DIAGNOSIS — A4289 Other forms of actinomycosis: Secondary | ICD-10-CM | POA: Diagnosis present

## 2021-10-25 DIAGNOSIS — L089 Local infection of the skin and subcutaneous tissue, unspecified: Secondary | ICD-10-CM

## 2021-10-25 DIAGNOSIS — E11621 Type 2 diabetes mellitus with foot ulcer: Secondary | ICD-10-CM | POA: Diagnosis not present

## 2021-10-25 DIAGNOSIS — Y793 Surgical instruments, materials and orthopedic devices (including sutures) associated with adverse incidents: Secondary | ICD-10-CM | POA: Diagnosis present

## 2021-10-25 DIAGNOSIS — Z833 Family history of diabetes mellitus: Secondary | ICD-10-CM

## 2021-10-25 DIAGNOSIS — E08621 Diabetes mellitus due to underlying condition with foot ulcer: Secondary | ICD-10-CM

## 2021-10-25 DIAGNOSIS — I82401 Acute embolism and thrombosis of unspecified deep veins of right lower extremity: Secondary | ICD-10-CM | POA: Diagnosis present

## 2021-10-25 DIAGNOSIS — L02611 Cutaneous abscess of right foot: Secondary | ICD-10-CM | POA: Diagnosis present

## 2021-10-25 DIAGNOSIS — E1165 Type 2 diabetes mellitus with hyperglycemia: Secondary | ICD-10-CM

## 2021-10-25 DIAGNOSIS — E11628 Type 2 diabetes mellitus with other skin complications: Secondary | ICD-10-CM | POA: Diagnosis present

## 2021-10-25 DIAGNOSIS — Z87891 Personal history of nicotine dependence: Secondary | ICD-10-CM

## 2021-10-25 DIAGNOSIS — Z79899 Other long term (current) drug therapy: Secondary | ICD-10-CM

## 2021-10-25 DIAGNOSIS — Z6841 Body Mass Index (BMI) 40.0 and over, adult: Secondary | ICD-10-CM

## 2021-10-25 DIAGNOSIS — L97514 Non-pressure chronic ulcer of other part of right foot with necrosis of bone: Secondary | ICD-10-CM | POA: Diagnosis not present

## 2021-10-25 DIAGNOSIS — E66813 Obesity, class 3: Secondary | ICD-10-CM | POA: Diagnosis present

## 2021-10-25 DIAGNOSIS — Y835 Amputation of limb(s) as the cause of abnormal reaction of the patient, or of later complication, without mention of misadventure at the time of the procedure: Secondary | ICD-10-CM | POA: Diagnosis present

## 2021-10-25 DIAGNOSIS — Z8249 Family history of ischemic heart disease and other diseases of the circulatory system: Secondary | ICD-10-CM

## 2021-10-25 DIAGNOSIS — E785 Hyperlipidemia, unspecified: Secondary | ICD-10-CM | POA: Diagnosis present

## 2021-10-25 DIAGNOSIS — T8789 Other complications of amputation stump: Principal | ICD-10-CM | POA: Diagnosis present

## 2021-10-25 DIAGNOSIS — R739 Hyperglycemia, unspecified: Secondary | ICD-10-CM

## 2021-10-25 NOTE — Progress Notes (Signed)
GERMANY, DODGEN ([id number]) [id number] Page 1 of 8 Visit Report for 10/25/2021 Chief Complaint Document Details Patient Name: Date of Service: [name] IS, [name] 10/25/2021 1:00 PM Medical Record Number: [id number] Patient Account Number: [id number] Date of Birth/Sex: Treating RN: 1980/04/01 (41 y.o. F) Primary Care Provider: Gwinda Passe Other Clinician: Referring Provider: Treating Provider/Extender: Grace Isaac in Treatment: 39 Information Obtained from: Patient Chief Complaint Osteomyelitis of the right foot status post transmetatarsal amputation with surgical site dehiscence Electronic Signature(s) Signed: 10/25/2021 4:46:31 PM By: Geralyn Corwin DO Entered By: Geralyn Corwin on 10/25/2021 14:03:14 -------------------------------------------------------------------------------- HPI Details Patient Name: Date of Service: [name], [name] 10/25/2021 1:00 PM Medical Record Number: [id number] Patient Account Number: [id number] Date of Birth/Sex: Treating RN: 1980-08-13 (41 y.o. F) Primary Care Provider: Gwinda Passe Other Clinician: Referring Provider: Treating Provider/Extender: Grace Isaac in Treatment: 39 History of Present Illness HPI Description: Admission 01/22/2021 Ms. Evalee [name] is a 41 year old female with a past medical history of insulin-dependent uncontrolled type 2 diabetes with last hemoglobin A1c of 13.5, osteomyelitis of the right foot status post transmetatarsal amputation on 12/18/2020 that presents to the clinic for right foot wound. She has had dehiscence of the surgical site. She is currently using wet-to-dry dressings. She has a PICC line and receiving IV ceftriaxone daily for her osteomyelitis. There is an end date of 01/27/2021. She is also taking oral metronidazole. She currently denies systemic signs of infection. 1/19; patient presents for follow-up. She  was diagnosed with a DVT to the right lower extremity 2 days ago. She is on Eliquis now. She is scheduled to see her infectious disease doctor tomorrow. She has been using Dakin's wet-to-dry dressings. She denies systemic signs of infection. 1/26; patient presents for follow-up. She saw infectious disease on 1/21 started on Augmentin. Her PICC line and IV ceftriaxone was discontinued. Patient reports stability to her wound. She has been using Dakin's wet-to-dry dressings. She currently denies systemic signs of infection. 2/3; patient presents for follow-up. She continues to use Dakin's wet-to-dry dressings to the wound bed. She saw Dr. Manson Passey with infectious disease yesterday and is continuing Augmentin. T entative end date is 2/16. Patient reports following up with orthopedics. She states there is no further plan from them. She currently denies systemic signs of infection. 2/10; patient presents for follow-up. She continues to use Dakin's wet to dry dressings. She is scheduled to have her MRI done on 2/14. She states that she had pain to the debridement site from last clinic visit and declines debridement today. She denies systemic signs of infection. She continues to have yellow thick drainage. 2/20; patient presents for follow-up. She continues to use Dakin's wet-to-dry dressings. She obtained her MRI. The results showed an abscess and she is scheduled to see her orthopedic surgeon on 2/23. She saw infectious disease 2/17 and her antibiotics were extended. She currently denies systemic signs of infection. 3/6; patient presents for follow-up. She had debridement and irrigation of her foot on 03/10/2021 due to abscess noted on MRI. She was started on IV ceftriaxone and oral Flagyl. She has no issues or complaints today. She has been using iodoform packing to the tunnel and Dakin's wet-to-dry to the opening. 03/26/2021: She continues on IV ceftriaxone and oral metronidazole. She has follow-up with  infectious disease tomorrow. No significant issues or complaints today. Her mother continues to help her with her wound dressing, using iodoform packing strips into the tunnel and Dakin's to  the open portion of the wound. 3/20; patient presents for follow-up. She continues to be on IV ceftriaxone in oral metronidazole. She has been using iodoform to the tunnel and Dakin's wet-toKINDLE, STROHMEIER ([id number]) [id number] Page 2 of 8 dry to the open wound. She denies signs of infection. 3/27; patient presents for follow-up. She no longer has a PICC line. She has been using iodoform to the tunnel and Dakin's wet-to-dry to the open wound. She reports improvement in wound healing. She denies signs of infection. 4/3; patient presents for follow-up. She states she has been using Hydrofera Blue to the open wound and iodoform packing to the tunnel without any issues. She denies signs of infection. 4/18; patient presents for follow-up. She saw infectious disease on 4/11. She has finished her oral antibiotics and completed a total of 6 weeks of antibiotics (this includes IV as well). No further antibiotics needed. She has been using Hydrofera Blue and iodoform packing. She states that the tunneled area has come in and the iodoform is not staying in place anymore. She has no issues or complaints today. She denies signs of infection. 4/24; patient presents for follow-up. She saw Dr. Despina Pole, plastic surgery to discuss potential skin graft/substitute placement. At this time he thinks that the skin graft would likely not take. He is in agreement with trying a wound VAC. Patient has been using Hydrofera Blue dressing changes with no issues. She denies signs of infection. She reports improvement in wound healing. 5/1; patient presents for follow-up. Unfortunately patient did not have insurance when we ran for the pico. There is an assistance program and we are trying to get this  accommodated for the patient. In the meantime she has been using Hydrofera Blue without any issues. She denies signs of infection. 5/8; patient presents for follow-up. We have not heard back if pico is covered by her insurance. She has been using collagen to the wound bed over the past week. She denies signs of infection. 5/18; patient presents for follow-up. She has been using collagen to the wound bed without issues. Again we have not heard if pico is covered by her insurance. She has no issues or complaints today. 5/23; patient presents for follow-up. She has been using collagen to the wound bed. She has no issues or complaints today. She obtained the wound VAC from [facility] and brought this in today. She denies signs of infection. 6/1; patient presents for follow-up. She has been using the wound VAC for the past week. She has had this changed twice since she was last here. She reports more maceration to the periwound. She denies signs of infection. 6/7; right TMA site. There are 2 wounds 0 separated by a bridge of healed tissue. The more lateral area has undermining. Both areas have healthy looking granulation at the base but relative the size of the wound is fairly deep. There is no exposed bone no evidence of infection. Her wound VAC was put on hold last week because of surrounding skin maceration she has been using collagen this week. She has a modified shoe 6/12; patient presents for follow-up. Last week the wound VAC was reinitiated. She had no issues with the wound VAC itself. Today she has maceration again noted to the surrounding skin. She denies signs of infection. 6/27; patient presents for follow-up. She has been using Medihoney to the wound bed. We took a break from the wound VAC because the periwound was macerated. She still has some areas of maceration to  the distal foot where there is a callus. She currently denies signs of infection. 7/11; patient presents for follow-up. She has been  using Medihoney to the wound bed. She has developed some increased warmth and erythema to the lateral aspect of the right foot. She states this is occurred over the past week and there is increased pain. No drainage noted. 7/17; patient presents for follow-up. She has been using Medihoney to the wound bed. She completed her course of Bactrim. She reports improvement in symptoms. 7/24; Patient presents for follow up. She has been using Medihoney to the wound bed without issues. She completed another course of Bactrim. She reports improvement in her symptoms but still has some mild tenderness to the medial aspect of the foot. 7/31; Patient presents for follow-up. She has been using Medihoney and Dakin's to the wound bed. She denies signs of infection. 8/14; patient presents for follow-up. She has been using Dakin's wet-to-dry packing strips to the right medial aspect of the amputation site and Medihoney to the anterior site. She has no issues or complaints today. She has started physical therapy. She denies signs of infection. 8/29; patient presents for follow-up. She has been using Dakin's wet-to-dry packing strips to the right medial aspect of the amputation site however this is becoming more difficult to place. She did report that she had increased redness and swelling to that site and developed some drainage. It has resolved. She continues with physical therapy. 9/8; patient presents for follow-up. We have been using silver alginate to the tunneled area. She completed her course of antibiotics. She reports no pain, increased swelling or erythema. 9/21; patient presents for follow-up. She has been using gentamicin to the tunneled area. She has no issues or complaints today. She denies signs of infection. 10/2; patient presents for follow-up. She is scheduled to have her CT scan on 10/9. She currently denies signs of infection. She denies increased warmth, erythema or purulent drainage from the wound  bed. She has been using Dakin's wet-to-dry dressings. 10/12; patient presents for follow-up. She had her CT scan on 10/9 that showed A small irregular rim-enhancing fluid pocket communicating to the overlying soft tissues of the sinus tract compatible with a small abscess. Currently she denies systemic signs of infection. She has been doing Dakin's wet-to-dry packing strips but it is hard for her to pack into the narrow opening. Electronic Signature(s) Signed: 10/25/2021 4:46:31 PM By: Geralyn Corwin DO Entered By: Geralyn Corwin on 10/25/2021 14:09:07 -------------------------------------------------------------------------------- Physical Exam Details Patient Name: Date of Service: [name], [name] 10/25/2021 1:00 PM Medical Record Number: [id number] Patient Account Number: [id number] KHADIJAH, MASTRIANNI ([id number]) [id number] Page 3 of 8 Date of Birth/Sex: Treating RN: 10-14-1980 (41 y.o. F) Primary Care Provider: Other Clinician: Gwinda Passe Referring Provider: Treating Provider/Extender: Grace Isaac in Treatment: 39 Constitutional respirations regular, non-labored and within target range for patient.. Cardiovascular 2+ dorsalis pedis/posterior tibialis pulses. Psychiatric pleasant and cooperative. Notes Right foot: T the transmetatarsal amputation site there is callus to the entire section except for the medial aspect where there is tunneling. No increased o warmth erythema or purulent drainage noted. Pain on palpation. Electronic Signature(s) Signed: 10/25/2021 4:46:31 PM By: Geralyn Corwin DO Entered By: Geralyn Corwin on 10/25/2021 14:09:35 -------------------------------------------------------------------------------- Physician Orders Details Patient Name: Date of Service: [name], [name] 10/25/2021 1:00 PM Medical Record Number: [id number] Patient Account Number: [id number] Date of Birth/Sex:  Treating RN: 23-Jun-1980 (41 y.o. [name], [name] Primary  Care Provider: Gwinda Passe Other Clinician: Referring Provider: Treating Provider/Extender: Grace Isaac in Treatment: 96 Verbal / Phone Orders: No Diagnosis Coding Follow-up Appointments ppointment in 1 week. - Thursday, with Dr. Mikey Bussing and Lennox Laity, Room 7 Return A Anesthetic (In clinic) Topical Lidocaine 5% applied to wound bed (In clinic) Topical Lidocaine 4% applied to wound bed Bathing/ Shower/ Hygiene Other Bathing/Shower/Hygiene Orders/Instructions: - Clean with Saline or Dakins Edema Control - Lymphedema / SCD / Other Elevate legs to the level of the heart or above for 30 minutes daily and/or when sitting, a frequency of: - throughout the day void standing for long periods of time. - Limit time/pressure on feet, reduce PT Use wheelchair instead of walker when you can. . A Moisturize legs daily. Off-Loading Open toe surgical shoe to: - surgical shoe: reduce pressure when walking Additional Orders / Instructions Follow Nutritious Diet - -Monitor/Control Blood Sugar -High Protein Diet Wound Treatment Wound #2 - Amputation Site - Transmetatarsal Wound Laterality: Right, Medial Cleanser: Normal Saline (Generic) 1 x Per Day/30 Days Discharge Instructions: Cleanse the wound with Normal Saline prior to applying a clean dressing using gauze sponges, not tissue or cotton balls. Topical: Gentamicin 1 x Per Day/30 Days Discharge Instructions: As directed by physician Secondary Dressing: Woven Gauze Sponge, Non-Sterile 4x4 in 1 x Per Day/30 Days Discharge Instructions: Apply over primary dressing as directed. Secured With: Elastic Bandage 4 inch (ACE bandage) (Generic) 1 x Per Day/30 Days TIMBERLEE, ROBLERO L ([id number]) [phone].pdf Page 4 of 8 Discharge Instructions: Secure with ACE bandage as directed. Secured With: [employer], 4.5x3.1 (in/yd) (Generic) 1 x  Per Day/30 Days Discharge Instructions: Secure with Kerlix as directed. Electronic Signature(s) Signed: 10/25/2021 4:46:31 PM By: Geralyn Corwin DO Entered By: Geralyn Corwin on 10/25/2021 14:36:04 -------------------------------------------------------------------------------- Problem List Details Patient Name: Date of Service: [name], [name] 10/25/2021 1:00 PM Medical Record Number: [id number] Patient Account Number: [id number] Date of Birth/Sex: Treating RN: 01/29/1980 (41 y.o. F) Primary Care Provider: Gwinda Passe Other Clinician: Referring Provider: Treating Provider/Extender: Grace Isaac in Treatment: 39 Active Problems ICD-10 Encounter Code Description Active Date MDM Diagnosis L97.514 Non-pressure chronic ulcer of other part of right foot with necrosis of bone 01/22/2021 No Yes E11.621 Type 2 diabetes mellitus with foot ulcer 01/22/2021 No Yes M86.9 Osteomyelitis, unspecified 01/22/2021 No Yes Inactive Problems Resolved Problems Electronic Signature(s) Signed: 10/25/2021 4:46:31 PM By: Geralyn Corwin DO Entered By: Geralyn Corwin on 10/25/2021 14:02:18 -------------------------------------------------------------------------------- Progress Note Details Patient Name: Date of Service: [name], [name] 10/25/2021 1:00 PM Medical Record Number: [id number] Patient Account Number: [id number] Date of Birth/Sex: Treating RN: [birth year] (41 y.o. F) Primary Care Provider: Gwinda Passe Other Clinician: Referring Provider: Treating Provider/Extender: Grace Isaac in Treatment: [address], [place] ([id number]) [id number] Page 5 of 8 Chief Complaint Information obtained from Patient Osteomyelitis of the right foot status post transmetatarsal amputation with surgical site dehiscence History of Present Illness (HPI) Admission 01/22/2021 Ms. Reagan Behlke is a  41 year old female with a past medical history of insulin-dependent uncontrolled type 2 diabetes with last hemoglobin A1c of 13.5, osteomyelitis of the right foot status post transmetatarsal amputation on 12/18/2020 that presents to the clinic for right foot wound. She has had dehiscence of the surgical site. She is currently using wet-to-dry dressings. She has a PICC line and receiving IV ceftriaxone daily for her osteomyelitis. There is an end date of 01/27/2021. She is also taking oral metronidazole.  She currently denies systemic signs of infection. 1/19; patient presents for follow-up. She was diagnosed with a DVT to the right lower extremity 2 days ago. She is on Eliquis now. She is scheduled to see her infectious disease doctor tomorrow. She has been using Dakin's wet-to-dry dressings. She denies systemic signs of infection. 1/26; patient presents for follow-up. She saw infectious disease on 1/21 started on Augmentin. Her PICC line and IV ceftriaxone was discontinued. Patient reports stability to her wound. She has been using Dakin's wet-to-dry dressings. She currently denies systemic signs of infection. 2/3; patient presents for follow-up. She continues to use Dakin's wet-to-dry dressings to the wound bed. She saw Dr. Manson Passey with infectious disease yesterday and is continuing Augmentin. T entative end date is 2/16. Patient reports following up with orthopedics. She states there is no further plan from them. She currently denies systemic signs of infection. 2/10; patient presents for follow-up. She continues to use Dakin's wet to dry dressings. She is scheduled to have her MRI done on 2/14. She states that she had pain to the debridement site from last clinic visit and declines debridement today. She denies systemic signs of infection. She continues to have yellow thick drainage. 2/20; patient presents for follow-up. She continues to use Dakin's wet-to-dry dressings. She obtained her MRI. The  results showed an abscess and she is scheduled to see her orthopedic surgeon on 2/23. She saw infectious disease 2/17 and her antibiotics were extended. She currently denies systemic signs of infection. 3/6; patient presents for follow-up. She had debridement and irrigation of her foot on 03/10/2021 due to abscess noted on MRI. She was started on IV ceftriaxone and oral Flagyl. She has no issues or complaints today. She has been using iodoform packing to the tunnel and Dakin's wet-to-dry to the opening. 03/26/2021: She continues on IV ceftriaxone and oral metronidazole. She has follow-up with infectious disease tomorrow. No significant issues or complaints today. Her mother continues to help her with her wound dressing, using iodoform packing strips into the tunnel and Dakin's to the open portion of the wound. 3/20; patient presents for follow-up. She continues to be on IV ceftriaxone in oral metronidazole. She has been using iodoform to the tunnel and Dakin's wet-to- dry to the open wound. She denies signs of infection. 3/27; patient presents for follow-up. She no longer has a PICC line. She has been using iodoform to the tunnel and Dakin's wet-to-dry to the open wound. She reports improvement in wound healing. She denies signs of infection. 4/3; patient presents for follow-up. She states she has been using Hydrofera Blue to the open wound and iodoform packing to the tunnel without any issues. She denies signs of infection. 4/18; patient presents for follow-up. She saw infectious disease on 4/11. She has finished her oral antibiotics and completed a total of 6 weeks of antibiotics (this includes IV as well). No further antibiotics needed. She has been using Hydrofera Blue and iodoform packing. She states that the tunneled area has come in and the iodoform is not staying in place anymore. She has no issues or complaints today. She denies signs of infection. 4/24; patient presents for follow-up. She saw  Dr. Carlene Coria, plastic surgery to discuss potential skin graft/substitute placement. At this time he thinks that the skin graft would likely not take. He is in agreement with trying a wound VAC. Patient has been using Hydrofera Blue dressing changes with no issues. She denies signs of infection. She reports improvement in wound healing. 5/1; patient  presents for follow-up. Unfortunately patient did not have insurance when we ran for the pico. There is an assistance program and we are trying to get this accommodated for the patient. In the meantime she has been using Hydrofera Blue without any issues. She denies signs of infection. 5/8; patient presents for follow-up. We have not heard back if pico is covered by her insurance. She has been using collagen to the wound bed over the past week. She denies signs of infection. 5/18; patient presents for follow-up. She has been using collagen to the wound bed without issues. Again we have not heard if pico is covered by her insurance. She has no issues or complaints today. 5/23; patient presents for follow-up. She has been using collagen to the wound bed. She has no issues or complaints today. She obtained the wound VAC from [facility] and brought this in today. She denies signs of infection. 6/1; patient presents for follow-up. She has been using the wound VAC for the past week. She has had this changed twice since she was last here. She reports more maceration to the periwound. She denies signs of infection. 6/7; right TMA site. There are 2 wounds 0 separated by a bridge of healed tissue. The more lateral area has undermining. Both areas have healthy looking granulation at the base but relative the size of the wound is fairly deep. There is no exposed bone no evidence of infection. Her wound VAC was put on hold last week because of surrounding skin maceration she has been using collagen this week. She has a modified shoe 6/12; patient presents for follow-up. Last  week the wound VAC was reinitiated. She had no issues with the wound VAC itself. Today she has maceration again noted to the surrounding skin. She denies signs of infection. 6/27; patient presents for follow-up. She has been using Medihoney to the wound bed. We took a break from the wound VAC because the periwound was macerated. She still has some areas of maceration to the distal foot where there is a callus. She currently denies signs of infection. 7/11; patient presents for follow-up. She has been using Medihoney to the wound bed. She has developed some increased warmth and erythema to the lateral aspect of the right foot. She states this is occurred over the past week and there is increased pain. No drainage noted. 7/17; patient presents for follow-up. She has been using Medihoney to the wound bed. She completed her course of Bactrim. She reports improvement in symptoms. 7/24; Patient presents for follow up. She has been using Medihoney to the wound bed without issues. She completed another course of Bactrim. She reports improvement in her symptoms but still has some mild tenderness to the medial aspect of the foot. 7/31; Patient presents for follow-up. She has been using Medihoney and Dakin's to the wound bed. She denies signs of infection. 8/14; patient presents for follow-up. She has been using Dakin's wet-to-dry packing strips to the right medial aspect of the amputation site and Medihoney to the anterior site. She has no issues or complaints today. She has started physical therapy. She denies signs of infection. KACIA, HALLEY ([id number]) [id number] Page 6 of 8 8/29; patient presents for follow-up. She has been using Dakin's wet-to-dry packing strips to the right medial aspect of the amputation site however this is becoming more difficult to place. She did report that she had increased redness and swelling to that site and developed some drainage. It has  resolved. She continues with  physical therapy. 9/8; patient presents for follow-up. We have been using silver alginate to the tunneled area. She completed her course of antibiotics. She reports no pain, increased swelling or erythema. 9/21; patient presents for follow-up. She has been using gentamicin to the tunneled area. She has no issues or complaints today. She denies signs of infection. 10/2; patient presents for follow-up. She is scheduled to have her CT scan on 10/9. She currently denies signs of infection. She denies increased warmth, erythema or purulent drainage from the wound bed. She has been using Dakin's wet-to-dry dressings. 10/12; patient presents for follow-up. She had her CT scan on 10/9 that showed A small irregular rim-enhancing fluid pocket communicating to the overlying soft tissues of the sinus tract compatible with a small abscess. Currently she denies systemic signs of infection. She has been doing Dakin's wet-to-dry packing strips but it is hard for her to pack into the narrow opening. Patient History Information obtained from Patient. Family History Cancer - Paternal Grandparents, Diabetes - Mother, Hypertension - Mother, Stroke - Maternal Grandparents, No family history of Heart Disease, Hereditary Spherocytosis, Kidney Disease, Lung Disease, Seizures, Thyroid Problems, Tuberculosis. Social History Never smoker, Marital Status - Single, Alcohol Use - Rarely, Drug Use - Prior History - Marijuana, Caffeine Use - Daily. Medical History Cardiovascular Patient has history of Hypertension Endocrine Patient has history of Type II Diabetes Musculoskeletal Patient has history of Osteomyelitis - Right Transmet 12/18/20 Neurologic Patient has history of Neuropathy Objective Constitutional respirations regular, non-labored and within target range for patient.. Vitals Time Taken: 12:57 PM, Height: 69 in, Temperature: 98.7 F, Pulse: 76 bpm, Respiratory Rate: 18  breaths/min, Blood Pressure: 123/83 mmHg. Cardiovascular 2+ dorsalis pedis/posterior tibialis pulses. Psychiatric pleasant and cooperative. General Notes: Right foot: T the transmetatarsal amputation site there is callus to the entire section except for the medial aspect where there is tunneling. No o increased warmth erythema or purulent drainage noted. Pain on palpation. Integumentary (Hair, Skin) Wound #2 status is Open. Original cause of wound was Surgical Injury. The date acquired was: 07/10/2021. The wound has been in treatment 15 weeks. The wound is located on the Right,Medial Amputation Site - Transmetatarsal. The wound measures 0.4cm length x 0.5cm width x 0.5cm depth; 0.157cm^2 area and 0.079cm^3 volume. There is Fat Layer (Subcutaneous Tissue) exposed. There is no tunneling or undermining noted. There is a small amount of purulent drainage noted. The wound margin is distinct with the outline attached to the wound base. There is large (67-100%) red granulation within the wound bed. There is a small (1-33%) amount of necrotic tissue within the wound bed including Adherent Slough. The periwound skin appearance had no abnormalities noted for color. The periwound skin appearance exhibited: Callus. The periwound skin appearance did not exhibit: Crepitus, Excoriation, Induration, Rash, Scarring, Dry/Scaly, Maceration. Periwound temperature was noted as No Abnormality. Assessment Active Problems ICD-10 Non-pressure chronic ulcer of other part of right foot with necrosis of bone Type 2 diabetes mellitus with foot ulcer Osteomyelitis, unspecified Kennard, Suly L ([id number]) [id number] Page 7 of 8 Patient's wound is stable. She had a CT scan a few days ago that was concerning for a small abscess to the medial aspect of the right foot. This likely explains why this area cannot heal. 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 I will go ahead and give her Bactrim. We called her orthopedic surgery office to help schedule an appointment as she will need  follow-up for her CT findings. She can use gentamicin to the narrow opening as it is hard to do a dressing to this area. Plan Follow-up Appointments: Return Appointment in 1 week. - Thursday, with Dr. Mikey BussingHoffman and Lennox LaityJodi, Room 7 Anesthetic: (In clinic) Topical Lidocaine 5% applied to wound bed (In clinic) Topical Lidocaine 4% applied to wound bed Bathing/ Shower/ Hygiene: Other Bathing/Shower/Hygiene Orders/Instructions: - Clean with Saline or Dakins Edema Control - Lymphedema / SCD / Other: Elevate legs to the level of the heart or above for 30 minutes daily and/or when sitting, a frequency of: - throughout the day Avoid standing for long periods of time. - Limit time/pressure on feet, reduce PT Use wheelchair instead of walker when you can. . Moisturize legs daily. Off-Loading: Open toe surgical shoe to: - surgical shoe: reduce pressure when walking Additional Orders / Instructions: Follow Nutritious Diet - -Monitor/Control Blood Sugar -High Protein Diet WOUND #2: - Amputation Site - Transmetatarsal Wound Laterality: Right, Medial Cleanser: Normal Saline (Generic) 1 x Per Day/30 Days Discharge Instructions: Cleanse the wound with Normal Saline prior to applying a clean dressing using gauze sponges, not tissue or cotton balls. Topical: Gentamicin 1 x Per Day/30 Days Discharge Instructions: As directed by physician Secondary Dressing: Woven Gauze Sponge, Non-Sterile 4x4 in 1 x Per Day/30 Days Discharge Instructions: Apply over primary dressing as directed. Secured With: Elastic Bandage 4 inch (ACE bandage) (Generic) 1 x Per Day/30 Days Discharge Instructions: Secure with ACE bandage as directed. Secured With: [employer] Roll Sterile, 4.5x3.1 (in/yd) (Generic) 1 x Per Day/30 Days Discharge Instructions: Secure with Kerlix as  directed. 1. Gentamicin ointment to the narrow opening 2. Follow back up with orthopedic surgery Dr.Ramanathan 3. Bactrim 4. Follow-up in 2 weeks Electronic Signature(s) Signed: 10/25/2021 4:46:31 PM By: Geralyn CorwinHoffman, Hadeel Hillebrand DO Entered By: Geralyn CorwinHoffman, Lamyah Creed on 10/25/2021 14:43:46 -------------------------------------------------------------------------------- HxROS Details Patient Name: Date of Service: [name], [name] 10/25/2021 1:00 PM Medical Record Number: [id number] Patient Account Number: [id number] Date of Birth/Sex: Treating RN: 1980-05-14 (41 y.o. F) Primary Care Provider: Gwinda PasseEdwards, Michelle Other Clinician: Referring Provider: Treating Provider/Extender: Grace IsaacHoffman, Zarion Oliff Edwards, Michelle Weeks in Treatment: 39 Information Obtained From Patient Cardiovascular Medical History: Positive for: Hypertension Endocrine Medical History: Positive for: Type II Diabetes Time with diabetes: Dx 2009 Treated with: Insulin, Oral agents Blood sugar tested every day: Yes Tested : daily Rosey BathDAVIS, Hulda L ([id number]) [id number] Page 8 of 8 Musculoskeletal Medical History: Positive for: Osteomyelitis - Right Transmet 12/18/20 Neurologic Medical History: Positive for: Neuropathy Immunizations Pneumococcal Vaccine: Received Pneumococcal Vaccination: No Implantable Devices Yes Family and Social History Cancer: Yes - Paternal Grandparents; Diabetes: Yes - Mother; Heart Disease: No; Hereditary Spherocytosis: No; Hypertension: Yes - Mother; Kidney Disease: No; Lung Disease: No; Seizures: No; Stroke: Yes - Maternal Grandparents; Thyroid Problems: No; Tuberculosis: No; Never smoker; Marital Status - Single; Alcohol Use: Rarely; Drug Use: Prior History - Marijuana; Caffeine Use: Daily; Financial Concerns: No; Food, Clothing or Shelter Needs: No; Support System Lacking: No; Transportation Concerns: No Electronic Signature(s) Signed: 10/25/2021 4:46:31 PM By:  Geralyn CorwinHoffman, Kahmya Pinkham DO Entered By: Geralyn CorwinHoffman, Kiele Heavrin on 10/25/2021 14:09:12 -------------------------------------------------------------------------------- SuperBill Details Patient Name: Date of Service: [name], [name] 10/25/2021 Medical Record Number: [id number] Patient Account Number: [id number] Date of Birth/Sex: Treating RN: 1980-05-14 (41 y.o. Ardis RowanF) Breedlove, [name] Primary Care Provider: Gwinda PasseEdwards, Michelle Other Clinician: Referring Provider: Treating Provider/Extender: Grace IsaacHoffman, Tramond Slinker Edwards, Michelle Weeks in Treatment: 39 Diagnosis Coding ICD-10 Codes Code Description [phone] Non-pressure chronic ulcer of other part of  right foot with necrosis of bone E11.621 Type 2 diabetes mellitus with foot ulcer M86.9 Osteomyelitis, unspecified Facility Procedures : CPT4 Code: [id number] Description: 99213 - WOUND CARE VISIT-LEV 3 EST PT Modifier: Quantity: 1 Physician Procedures : CPT4 Code Description Modifier [id number] 99214 - WC PHYS LEVEL 4 - EST PT ICD-10 Diagnosis Description L97.514 Non-pressure chronic ulcer of other part of right foot with necrosis of bone E11.621 Type 2 diabetes mellitus with foot ulcer M86.9  Osteomyelitis, unspecified Quantity: 1 Electronic Signature(s) Signed: 10/25/2021 4:46:31 PM By: Geralyn Corwin DO Entered By: Geralyn Corwin on 10/25/2021 14:43:56

## 2021-10-25 NOTE — ED Triage Notes (Signed)
Pt states that she had a CT on Monday of her foot and has abscess and told to come here for MRI before having surgery.

## 2021-10-25 NOTE — Progress Notes (Signed)
[name] ([id number]) [id number] Page 1 of 9 Visit Report for 10/25/2021 Arrival Information Details Patient Name: Date of Service: [name], [name] [name]. 10/25/2021 1:00 PM Medical Record Number: [id number] Patient Account Number: [id number] Date of Birth/Sex: Treating [name]: [birth year] (41 y.o. F) Primary Care [name]: [name] Other Clinician: Referring [name]: Treating [name]/Extender: [name] in Treatment: 39 Visit Information History Since Last Visit Added or deleted any medications: No Patient Arrived: [name] Any new allergies or adverse reactions: No Arrival Time: 12:53 Had a fall or experienced change in No Accompanied By: daughter activities of daily living that may affect Transfer Assistance: None risk of falls: Patient Identification Verified: Yes Signs or symptoms of abuse/neglect since last visito No Secondary Verification Process Completed: Yes Hospitalized since last visit: No Patient Requires Transmission-Based No Implantable device outside of the clinic excluding No Precautions: cellular tissue based products placed in the center Patient Has Alerts: Yes since last visit: Patient Alerts: Patient on Blood Thinner Has Dressing in Place as Prescribed: Yes PICC R Arm Pain Present Now: No ABI 12/17/20 R=1.08 [name]=1.13 Electronic Signature(s) Signed: 10/25/2021 4:31:24 PM By: [name] Entered By: [name] on 10/25/2021 12:57:53 -------------------------------------------------------------------------------- Clinic Level of Care Assessment Details Patient Name: Date of Service: [name] V IS, [name] [name]. 10/25/2021 1:00 PM Medical Record Number: [id number] Patient Account Number: [id number] Date of Birth/Sex: Treating [name]: 1980/09/01 (41 y.o. [name], [name] Primary Care [name]: [name] Other Clinician: Referring [name]: Treating [name]/Extender: [name] in Treatment: 39 Clinic Level of Care Assessment Items TOOL 4 Quantity Score X- 1 0 Use when only an EandM [name] ASSESSMENTS - Nursing Assessment / Reassessment X- 1 10 Reassessment of Co-morbidities (includes updates in patient status) X- 1 5 Reassessment of Adherence to Treatment Plan ASSESSMENTS - Wound and Skin A ssessment / Reassessment X - Simple Wound Assessment / Reassessment - one wound 1 5 []  - 0 Complex Wound Assessment / Reassessment - multiple wounds []  - 0 Dermatologic / Skin Assessment (not related to wound area) ASSESSMENTS - Focused Assessment X- 1 5 Circumferential Edema Measurements - multi extremities []  - 0 Nutritional Assessment / Counseling / Intervention [name], [name] ([id number]) [id number] Page 2 of 9 []  - 0 Lower Extremity Assessment (monofilament, tuning fork, pulses) []  - 0 Peripheral Arterial Disease Assessment (using hand held doppler) ASSESSMENTS - Ostomy and/or Continence Assessment and Care []  - 0 Incontinence Assessment and Management []  - 0 Ostomy Care Assessment and Management (repouching, etc.) PROCESS - Coordination of Care X - Simple Patient / Family Education for ongoing care 1 15 []  - 0 Complex (extensive) Patient / Family Education for ongoing care X- 1 10 Staff obtains Programmer, systems, Records, T Results / Process Orders est []  - 0 Staff telephones HHA, Nursing Homes / Clarify orders / etc []  - 0 Routine Transfer to another Facility (non-emergent condition) []  - 0 Routine Hospital Admission (non-emergent condition) []  - 0 New Admissions / Biomedical engineer / Ordering NPWT Apligraf, etc. , []  - 0 Emergency Hospital Admission (emergent condition) X- 1 10 Simple Discharge Coordination []  - 0 Complex (extensive) Discharge Coordination PROCESS - Special Needs []  - 0 Pediatric / Minor Patient Management []  - 0 Isolation Patient  Management []  - 0 Hearing / Language / Visual special needs []  - 0 Assessment of Community assistance (transportation, D/C planning, etc.) []  - 0 Additional assistance / Altered mentation []  - 0 Support Surface(s) Assessment (bed, cushion,  seat, etc.) INTERVENTIONS - Wound Cleansing / Measurement X - Simple Wound Cleansing - one wound 1 5 []  - 0 Complex Wound Cleansing - multiple wounds X- 1 5 Wound Imaging (photographs - any number of wounds) []  - 0 Wound Tracing (instead of photographs) X- 1 5 Simple Wound Measurement - one wound []  - 0 Complex Wound Measurement - multiple wounds INTERVENTIONS - Wound Dressings X - Small Wound Dressing one or multiple wounds 1 10 []  - 0 Medium Wound Dressing one or multiple wounds []  - 0 Large Wound Dressing one or multiple wounds X- 1 5 Application of Medications - topical []  - 0 Application of Medications - injection INTERVENTIONS - Miscellaneous []  - 0 External ear exam []  - 0 Specimen Collection (cultures, biopsies, blood, body fluids, etc.) []  - 0 Specimen(s) / Culture(s) sent or taken to Lab for analysis []  - 0 Patient Transfer (multiple staff / Civil Service fast streamer / Similar devices) []  - 0 Simple Staple / Suture removal (25 or less) []  - 0 Complex Staple / Suture removal (26 or more) []  - 0 Hypo / Hyperglycemic Management (close monitor of Blood Glucose) [name], [name] ([id number]) [id number] Page 3 of 9 []  - 0 Ankle / Brachial Index (ABI) - do not check if billed separately X- 1 5 Vital Signs Has the patient been seen at the hospital within the last three years: Yes Total Score: 95 Level Of Care: New/Established - Level 3 Electronic Signature(s) Signed: 10/25/2021 4:29:25 PM By: [name] Entered By: [name] Hammock on 10/25/2021 13:52:14 -------------------------------------------------------------------------------- Encounter Discharge Information Details Patient Name: Date of  Service: [name] V IS, [name] [name]. 10/25/2021 1:00 PM Medical Record Number: [id number] Patient Account Number: [id number] Date of Birth/Sex: Treating [name]: 12/29/1980 (41 y.o. [name] Primary Care Shaquira Moroz: [name] Other Clinician: Referring Jared Whorley: Treating Hansika Leaming/Extender: [name] in Treatment: 39 Encounter Discharge Information Items Discharge Condition: Stable Ambulatory Status: [name] Discharge Destination: Home Transportation: Other Schedule Follow-up Appointment: Yes Clinical Summary of Care: Provided on 10/25/2021 Form Type Recipient Paper Patient Patient Electronic Signature(s) Signed: 10/25/2021 4:29:25 PM By: [name] Previous Signature: 10/25/2021 1:48:41 PM Version By: Lorrin Jackson Entered By: [name] Hammock on 10/25/2021 13:52:40 -------------------------------------------------------------------------------- Lower Extremity Assessment Details Patient Name: Date of Service: [name] V IS, [name] [name]. 10/25/2021 1:00 PM Medical Record Number: [id number] Patient Account Number: [id number] Date of Birth/Sex: Treating [name]: 14-Jun-1980 (41 y.o. F) Primary Care Quante Pettry: [name] Other Clinician: Referring Rhesa Forsberg: Treating Ester Hilley/Extender: [name] in Treatment: 39 Edema Assessment Assessed: [Left: No] [Right: No] Edema: [Left: Ye] [Right: s] Calf Left: Right: Point of Measurement: 34 cm From Medial Instep 546.7 cm Ankle Left: Right: Point of Measurement: 8 cm From Medial Instep 26.5 cm [name], [name] ([id number]) [id number] Page 4 of 9 Electronic Signature(s) Signed: 10/25/2021 4:31:24 PM By: [name] Entered By: [name] on 10/25/2021 13:07:47 -------------------------------------------------------------------------------- Multi Wound Chart Details Patient Name: Date of Service: [name] V IS, [name] [name]. 10/25/2021 1:00  PM Medical Record Number: [id number] Patient Account Number: [id number] Date of Birth/Sex: Treating [name]: 08/01/1980 (41 y.o. F) Primary Care Inis Borneman: [name] Other Clinician: Referring Seanpaul Preece: Treating Obera Stauch/Extender: [name] in Treatment: 39 Vital Signs Height(in): 42 Pulse(bpm): 11 Weight(lbs): Blood Pressure(mmHg): 123/83 Body Mass Index(BMI): Temperature(F): 98.7 Respiratory Rate(breaths/min): 18 [2:Photos:] [N/A:N/A] Right, Medial Amputation Site - N/A N/A Wound Location: Transmetatarsal Surgical Injury N/A N/A Wounding Event: Dehisced Wound N/A N/A Primary Etiology: Hypertension, Type II  Diabetes, N/A N/A Comorbid History: Osteomyelitis, Neuropathy 07/10/2021 N/A N/A Date Acquired: 15 N/A N/A Weeks of Treatment: Open N/A N/A Wound Status: No N/A N/A Wound Recurrence: 0.4x0.5x0.5 N/A N/A Measurements [name] x W x D (cm) 0.157 N/A N/A A (cm) : rea 0.079 N/A N/A Volume (cm) : -33.10% N/A N/A % Reduction in Area: -125.70% N/A N/A % Reduction in Volume: Full Thickness Without Exposed N/A N/A Classification: Support Structures Small N/A N/A Exudate Amount: Purulent N/A N/A Exudate Type: yellow, brown, green N/A N/A Exudate Color: Distinct, outline attached N/A N/A Wound Margin: Large (67-100%) N/A N/A Granulation Amount: Red N/A N/A Granulation Quality: Small (1-33%) N/A N/A Necrotic Amount: Fat Layer (Subcutaneous Tissue): Yes N/A N/A Exposed Structures: Fascia: No Tendon: No Muscle: No Joint: No Bone: No Large (67-100%) N/A N/A Epithelialization: Callus: Yes N/A N/A Periwound Skin Texture: Excoriation: No Induration: No Crepitus: No Rash: No Scarring: No Maceration: No N/A N/A Periwound Skin Moisture: Dry/Scaly: No Atrophie Blanche: No N/A N/A Periwound Skin Color: Cyanosis: No Minchew, Arraya [name] ([id number]) [id number] Page 5 of 9 Ecchymosis: No Erythema:  No Hemosiderin Staining: No Mottled: No Pallor: No Rubor: No No Abnormality N/A N/A Temperature: Treatment Notes Wound #2 (Amputation Site - Transmetatarsal) Wound Laterality: Right, Medial Cleanser Normal Saline Discharge Instruction: Cleanse the wound with Normal Saline prior to applying a clean dressing using gauze sponges, not tissue or cotton balls. Peri-Wound Care Topical Gentamicin Discharge Instruction: As directed by physician Primary Dressing Secondary Dressing Woven Gauze Sponge, Non-Sterile 4x4 in Discharge Instruction: Apply over primary dressing as directed. Secured With Elastic Bandage 4 inch (ACE bandage) Discharge Instruction: Secure with ACE bandage as directed. Kerlix Roll Sterile, 4.5x3.1 (in/yd) Discharge Instruction: Secure with Kerlix as directed. Compression Wrap Compression Stockings Add-Ons Electronic Signature(s) Signed: 10/25/2021 4:46:31 PM By: Kalman Shan DO Entered By: Kalman Shan on 10/25/2021 14:02:32 -------------------------------------------------------------------------------- Multi-Disciplinary Care Plan Details Patient Name: Date of Service: [name] V IS, [name] [name]. 10/25/2021 1:00 PM Medical Record Number: [id number] Patient Account Number: [id number] Date of Birth/Sex: Treating [name]: 05/19/1980 (41 y.o. [name], [name] Primary Care Jenell Dobransky: [name] Other Clinician: Referring Breunna Nordmann: Treating Journey Castonguay/Extender: [name] in Treatment: 39 Active Inactive Nutrition Nursing Diagnoses: Impaired glucose control: actual or potential Goals: Patient/caregiver verbalizes understanding of need to maintain therapeutic glucose control per primary care physician Date Initiated: 01/22/2021 Target Resolution Date: 11/09/2021 Goal Status: Active Interventions: [name], [name] ([id number]) [id number] Page 6 of 9 Assess HgA1c results as ordered upon admission and as  needed Provide education on elevated blood sugars and impact on wound healing Treatment Activities: Obtain HgA1c : 01/22/2021 Notes: 07/10/21: Glucose control ongoing 09/11/21 : Glucose control ongoing, patient not compliant in checking glucose. Wound/Skin Impairment Nursing Diagnoses: Impaired tissue integrity Goals: Patient/caregiver will verbalize understanding of skin care regimen Date Initiated: 01/22/2021 Target Resolution Date: 11/09/2021 Goal Status: Active Ulcer/skin breakdown will have a volume reduction of 30% by week 4 Date Initiated: 01/22/2021 Date Inactivated: 03/19/2021 Target Resolution Date: 03/16/2021 Goal Status: Met Ulcer/skin breakdown will have a volume reduction of 50% by week 8 Date Initiated: 03/19/2021 Date Inactivated: 05/14/2021 Target Resolution Date: 04/16/2021 Unmet Reason: see wound Goal Status: Unmet measurements Interventions: Assess patient/caregiver ability to obtain necessary supplies Assess patient/caregiver ability to perform ulcer/skin care regimen upon admission and as needed Assess ulceration(s) every visit Provide education on ulcer and skin care Treatment Activities: Topical wound management initiated : 01/22/2021 Notes: 06/04/21: Wound vac started 07/10/21: Wound care regimen continues Electronic Signature(s) Signed: 10/25/2021  4:29:25 PM By: [name] Entered By: [name] Hammock on 10/25/2021 13:04:48 -------------------------------------------------------------------------------- Pain Assessment Details Patient Name: Date of Service: [name] V IS, [name] [name]. 10/25/2021 1:00 PM Medical Record Number: [id number] Patient Account Number: [id number] Date of Birth/Sex: Treating [name]: 1980/08/24 (41 y.o. F) Primary Care Trenity Pha: [name] Other Clinician: Referring Imaya Duffy: Treating Mechel Schutter/Extender: [name] in Treatment: 39 Active Problems Location of Pain Severity and Description of Pain Patient  Has Paino No Site Locations [name], [name] ([id number]) [phone].pdf Page 7 of 9 Pain Management and Medication Current Pain Management: Electronic Signature(s) Signed: 10/25/2021 4:31:24 PM By: [name] Entered By: [name] on 10/25/2021 12:59:05 -------------------------------------------------------------------------------- Patient/Caregiver Education Details Patient Name: Date of Service: [name] [name], [name] [name]. 10/12/2023andnbsp1:00 PM Medical Record Number: [id number] Patient Account Number: [id number] Date of Birth/Gender: Treating [name]: 1980/08/25 (41 y.o. [name], [name] Primary Care Physician: [name] Other Clinician: Referring Physician: Treating Physician/Extender: [name] in Treatment: 72 Education Assessment Education Provided To: Patient Education Topics Provided Elevated Blood Sugar/ Impact on Healing: Methods: Explain/Verbal Responses: Reinforcements needed, State content correctly Wound/Skin Impairment: Methods: Explain/Verbal Responses: Reinforcements needed, State content correctly Electronic Signature(s) Signed: 10/25/2021 4:29:25 PM By: [name] Entered By: [name] Hammock on 10/25/2021 13:04:33 -------------------------------------------------------------------------------- Wound Assessment Details Patient Name: Date of Service: [name] V IS, [name] [name]. 10/25/2021 1:00 PM [name], [name] ([id number]) [id number] Page 8 of 9 Medical Record Number: [id number] Patient Account Number: [id number] Date of Birth/Sex: Treating [name]: 1980/02/05 (40 y.o. F) Primary Care Adaijah Endres: [name] Other Clinician: Referring Yakelin Grenier: Treating Odean Mcelwain/Extender: [name] in Treatment: 39 Wound Status Wound Number: 2 Primary Etiology: Dehisced Wound Wound Location: Right, Medial Amputation Site - Transmetatarsal Wound  Status: Open Wounding Event: Surgical Injury Comorbid Hypertension, Type II Diabetes, Osteomyelitis, History: Neuropathy Date Acquired: 07/10/2021 Weeks Of Treatment: 15 Clustered Wound: No Photos Wound Measurements Length: (cm) 0.4 Width: (cm) 0.5 Depth: (cm) 0.5 Area: (cm) 0.157 Volume: (cm) 0.079 % Reduction in Area: -33.1% % Reduction in Volume: -125.7% Epithelialization: Large (67-100%) Tunneling: No Undermining: No Wound Description Classification: Full Thickness Without Exposed Support Structures Wound Margin: Distinct, outline attached Exudate Amount: Small Exudate Type: Purulent Exudate Color: yellow, brown, green Foul Odor After Cleansing: No Slough/Fibrino Yes Wound Bed Granulation Amount: Large (67-100%) Exposed Structure Granulation Quality: Red Fascia Exposed: No Necrotic Amount: Small (1-33%) Fat Layer (Subcutaneous Tissue) Exposed: Yes Necrotic Quality: Adherent Slough Tendon Exposed: No Muscle Exposed: No Joint Exposed: No Bone Exposed: No Periwound Skin Texture Texture Color No Abnormalities Noted: No No Abnormalities Noted: Yes Callus: Yes Temperature / Pain Crepitus: No Temperature: No Abnormality Excoriation: No Induration: No Rash: No Scarring: No Moisture No Abnormalities Noted: No Dry / Scaly: No Maceration: No Treatment Notes Wound #2 (Amputation Site - Transmetatarsal) Wound Laterality: Right, Medial Cleanser Normal Saline Discharge Instruction: Cleanse the wound with Normal Saline prior to applying a clean dressing using gauze sponges, not tissue or cotton balls. [name], [name] ([id number]) [id number] Page 9 of 9 Peri-Wound Care Topical Gentamicin Discharge Instruction: As directed by physician Primary Dressing Secondary Dressing Woven Gauze Sponge, Non-Sterile 4x4 in Discharge Instruction: Apply over primary dressing as directed. Secured With Elastic Bandage 4 inch (ACE bandage) Discharge  Instruction: Secure with ACE bandage as directed. Kerlix Roll Sterile, 4.5x3.1 (in/yd) Discharge Instruction: Secure with Kerlix as directed. Compression Wrap Compression Stockings Add-Ons Electronic Signature(s) Signed: 10/25/2021 4:31:24 PM By: [name] Entered By: [name] on 10/25/2021 13:12:28 --------------------------------------------------------------------------------  Vitals Details Patient Name: Date of Service: Shaune Pascal [name], [name] [name]. 10/25/2021 1:00 PM Medical Record Number: [id number] Patient Account Number: [id number] Date of Birth/Sex: Treating [name]: Jun 07, 1980 (41 y.o. F) Primary Care Margarette Vannatter: [name] Other Clinician: Referring Naketa Daddario: Treating Breslyn Abdo/Extender: [name] in Treatment: 39 Vital Signs Time Taken: 12:57 Temperature (F): 98.7 Height (in): 69 Pulse (bpm): 76 Respiratory Rate (breaths/min): 18 Blood Pressure (mmHg): 123/83 Reference Range: 80 - 120 mg / dl Electronic Signature(s) Signed: 10/25/2021 4:31:24 PM By: [name] Entered By: [name] on 10/25/2021 12:58:58

## 2021-10-26 ENCOUNTER — Encounter (HOSPITAL_COMMUNITY): Payer: Self-pay | Admitting: Internal Medicine

## 2021-10-26 ENCOUNTER — Inpatient Hospital Stay (HOSPITAL_COMMUNITY): Payer: Medicaid Other

## 2021-10-26 DIAGNOSIS — Z833 Family history of diabetes mellitus: Secondary | ICD-10-CM | POA: Diagnosis not present

## 2021-10-26 DIAGNOSIS — R739 Hyperglycemia, unspecified: Secondary | ICD-10-CM | POA: Diagnosis present

## 2021-10-26 DIAGNOSIS — Z87891 Personal history of nicotine dependence: Secondary | ICD-10-CM | POA: Diagnosis not present

## 2021-10-26 DIAGNOSIS — Z86718 Personal history of other venous thrombosis and embolism: Secondary | ICD-10-CM | POA: Diagnosis not present

## 2021-10-26 DIAGNOSIS — I82409 Acute embolism and thrombosis of unspecified deep veins of unspecified lower extremity: Secondary | ICD-10-CM | POA: Diagnosis not present

## 2021-10-26 DIAGNOSIS — Z8249 Family history of ischemic heart disease and other diseases of the circulatory system: Secondary | ICD-10-CM | POA: Diagnosis not present

## 2021-10-26 DIAGNOSIS — E1165 Type 2 diabetes mellitus with hyperglycemia: Secondary | ICD-10-CM | POA: Diagnosis present

## 2021-10-26 DIAGNOSIS — Z6841 Body Mass Index (BMI) 40.0 and over, adult: Secondary | ICD-10-CM | POA: Diagnosis not present

## 2021-10-26 DIAGNOSIS — Z794 Long term (current) use of insulin: Secondary | ICD-10-CM | POA: Diagnosis not present

## 2021-10-26 DIAGNOSIS — E11628 Type 2 diabetes mellitus with other skin complications: Secondary | ICD-10-CM

## 2021-10-26 DIAGNOSIS — I159 Secondary hypertension, unspecified: Secondary | ICD-10-CM | POA: Diagnosis not present

## 2021-10-26 DIAGNOSIS — L02611 Cutaneous abscess of right foot: Secondary | ICD-10-CM | POA: Diagnosis present

## 2021-10-26 DIAGNOSIS — E785 Hyperlipidemia, unspecified: Secondary | ICD-10-CM | POA: Diagnosis present

## 2021-10-26 DIAGNOSIS — A4289 Other forms of actinomycosis: Secondary | ICD-10-CM | POA: Diagnosis present

## 2021-10-26 DIAGNOSIS — L089 Local infection of the skin and subcutaneous tissue, unspecified: Secondary | ICD-10-CM | POA: Diagnosis not present

## 2021-10-26 DIAGNOSIS — Y835 Amputation of limb(s) as the cause of abnormal reaction of the patient, or of later complication, without mention of misadventure at the time of the procedure: Secondary | ICD-10-CM | POA: Diagnosis present

## 2021-10-26 DIAGNOSIS — I10 Essential (primary) hypertension: Secondary | ICD-10-CM | POA: Diagnosis present

## 2021-10-26 DIAGNOSIS — T8789 Other complications of amputation stump: Secondary | ICD-10-CM | POA: Diagnosis present

## 2021-10-26 DIAGNOSIS — Z79899 Other long term (current) drug therapy: Secondary | ICD-10-CM | POA: Diagnosis not present

## 2021-10-26 DIAGNOSIS — Y793 Surgical instruments, materials and orthopedic devices (including sutures) associated with adverse incidents: Secondary | ICD-10-CM | POA: Diagnosis present

## 2021-10-26 DIAGNOSIS — Z7984 Long term (current) use of oral hypoglycemic drugs: Secondary | ICD-10-CM | POA: Diagnosis not present

## 2021-10-26 LAB — CBC WITH DIFFERENTIAL/PLATELET
Abs Immature Granulocytes: 0.03 10*3/uL (ref 0.00–0.07)
Basophils Absolute: 0 10*3/uL (ref 0.0–0.1)
Basophils Relative: 0 %
Eosinophils Absolute: 0.1 10*3/uL (ref 0.0–0.5)
Eosinophils Relative: 1 %
HCT: 37.4 % (ref 36.0–46.0)
Hemoglobin: 12.1 g/dL (ref 12.0–15.0)
Immature Granulocytes: 0 %
Lymphocytes Relative: 28 %
Lymphs Abs: 2.6 10*3/uL (ref 0.7–4.0)
MCH: 27.1 pg (ref 26.0–34.0)
MCHC: 32.4 g/dL (ref 30.0–36.0)
MCV: 83.9 fL (ref 80.0–100.0)
Monocytes Absolute: 0.7 10*3/uL (ref 0.1–1.0)
Monocytes Relative: 8 %
Neutro Abs: 5.9 10*3/uL (ref 1.7–7.7)
Neutrophils Relative %: 63 %
Platelets: 247 10*3/uL (ref 150–400)
RBC: 4.46 MIL/uL (ref 3.87–5.11)
RDW: 15.1 % (ref 11.5–15.5)
WBC: 9.4 10*3/uL (ref 4.0–10.5)
nRBC: 0 % (ref 0.0–0.2)

## 2021-10-26 LAB — COMPREHENSIVE METABOLIC PANEL
ALT: 7 U/L (ref 0–44)
AST: 17 U/L (ref 15–41)
Albumin: 3.2 g/dL — ABNORMAL LOW (ref 3.5–5.0)
Alkaline Phosphatase: 111 U/L (ref 38–126)
Anion gap: 11 (ref 5–15)
BUN: 12 mg/dL (ref 6–20)
CO2: 22 mmol/L (ref 22–32)
Calcium: 8.8 mg/dL — ABNORMAL LOW (ref 8.9–10.3)
Chloride: 101 mmol/L (ref 98–111)
Creatinine, Ser: 0.8 mg/dL (ref 0.44–1.00)
GFR, Estimated: >60 mL/min (ref >60)
Glucose, Bld: 411 mg/dL — ABNORMAL HIGH (ref 70–99)
Potassium: 4 mmol/L (ref 3.5–5.1)
Sodium: 134 mmol/L — ABNORMAL LOW (ref 135–145)
Total Bilirubin: 0.4 mg/dL (ref 0.3–1.2)
Total Protein: 7 g/dL (ref 6.5–8.1)

## 2021-10-26 LAB — GLUCOSE, CAPILLARY
Glucose-Capillary: 214 mg/dL — ABNORMAL HIGH (ref 70–99)
Glucose-Capillary: 185 mg/dL — ABNORMAL HIGH (ref 70–99)
Glucose-Capillary: 148 mg/dL — ABNORMAL HIGH (ref 70–99)
Glucose-Capillary: 308 mg/dL — ABNORMAL HIGH (ref 70–99)

## 2021-10-26 MED ORDER — ACETAMINOPHEN 650 MG RE SUPP: 650.0000 mg | Freq: Four times a day (QID) | RECTAL | Status: DC | PRN

## 2021-10-26 MED ORDER — SODIUM CHLORIDE 0.9 % IV BOLUS
1000.0000 mL | Freq: Once | INTRAVENOUS | Status: AC
  Administered 2021-10-26: 1000 mL via INTRAVENOUS

## 2021-10-26 MED ORDER — OXYCODONE HCL 5 MG PO TABS: 5.0000 mg | ORAL_TABLET | Freq: Four times a day (QID) | ORAL | Status: DC | PRN

## 2021-10-26 MED ORDER — NALOXONE HCL 0.4 MG/ML IJ SOLN: 0.4000 mg | INTRAMUSCULAR | Status: DC | PRN

## 2021-10-26 MED ORDER — GABAPENTIN 300 MG PO CAPS
600.0000 mg | ORAL_CAPSULE | Freq: Three times a day (TID) | ORAL | Status: DC
  Administered 2021-10-26 – 2021-10-31 (×17): 600 mg via ORAL
  Filled 2021-10-26 (×17): qty 2

## 2021-10-26 MED ORDER — MORPHINE SULFATE (PF) 2 MG/ML IV SOLN: 1.0000 mg | INTRAVENOUS | Status: DC | PRN

## 2021-10-26 MED ORDER — INSULIN ASPART 100 UNIT/ML IJ SOLN
6.0000 [IU] | Freq: Once | INTRAMUSCULAR | Status: AC
  Administered 2021-10-26: 6 [IU] via SUBCUTANEOUS
  Filled 2021-10-26: qty 0.06

## 2021-10-26 MED ORDER — ACETAMINOPHEN 325 MG PO TABS
650.0000 mg | ORAL_TABLET | Freq: Four times a day (QID) | ORAL | Status: DC | PRN
  Administered 2021-10-26 – 2021-10-29 (×5): 650 mg via ORAL
  Filled 2021-10-26 (×5): qty 2

## 2021-10-26 MED ORDER — CHLORHEXIDINE GLUCONATE CLOTH 2 % EX PADS
6.0000 | MEDICATED_PAD | Freq: Every day | CUTANEOUS | Status: DC
  Administered 2021-10-26 – 2021-10-27 (×2): 6 via TOPICAL

## 2021-10-26 MED ORDER — ATORVASTATIN CALCIUM 40 MG PO TABS
80.0000 mg | ORAL_TABLET | Freq: Every day | ORAL | Status: DC
  Administered 2021-10-26 – 2021-10-31 (×6): 80 mg via ORAL
  Filled 2021-10-26 (×6): qty 2

## 2021-10-26 MED ORDER — INSULIN GLARGINE-YFGN 100 UNIT/ML ~~LOC~~ SOLN
35.0000 [IU] | Freq: Every day | SUBCUTANEOUS | Status: DC
  Administered 2021-10-26 – 2021-10-27 (×2): 35 [IU] via SUBCUTANEOUS
  Filled 2021-10-26 (×3): qty 0.35

## 2021-10-26 MED ORDER — VANCOMYCIN HCL 2000 MG/400ML IV SOLN
2000.0000 mg | Freq: Once | INTRAVENOUS | Status: AC
  Administered 2021-10-26: 2000 mg via INTRAVENOUS
  Filled 2021-10-26: qty 400

## 2021-10-26 MED ORDER — LISINOPRIL 5 MG PO TABS
2.5000 mg | ORAL_TABLET | Freq: Every day | ORAL | Status: DC
  Administered 2021-10-26 – 2021-10-31 (×6): 2.5 mg via ORAL
  Filled 2021-10-26 (×7): qty 1

## 2021-10-26 MED ORDER — GADOBUTROL 1 MMOL/ML IV SOLN
10.0000 mL | Freq: Once | INTRAVENOUS | Status: AC | PRN
  Administered 2021-10-26: 10 mL via INTRAVENOUS

## 2021-10-26 MED ORDER — FENTANYL CITRATE PF 50 MCG/ML IJ SOSY: 50.0000 ug | PREFILLED_SYRINGE | INTRAMUSCULAR | Status: DC | PRN

## 2021-10-26 MED ORDER — SODIUM CHLORIDE 0.9 % IV SOLN
2.0000 g | Freq: Three times a day (TID) | INTRAVENOUS | Status: DC
  Administered 2021-10-26 – 2021-10-28 (×6): 2 g via INTRAVENOUS
  Filled 2021-10-26 (×6): qty 12.5

## 2021-10-26 MED ORDER — HEPARIN SODIUM (PORCINE) 5000 UNIT/ML IJ SOLN
5000.0000 [IU] | Freq: Three times a day (TID) | INTRAMUSCULAR | Status: DC
  Administered 2021-10-26 – 2021-10-28 (×3): 5000 [IU] via SUBCUTANEOUS
  Filled 2021-10-26 (×3): qty 1

## 2021-10-26 MED ORDER — METRONIDAZOLE 500 MG/100ML IV SOLN
500.0000 mg | Freq: Two times a day (BID) | INTRAVENOUS | Status: DC
  Administered 2021-10-26 – 2021-10-28 (×6): 500 mg via INTRAVENOUS
  Filled 2021-10-26 (×6): qty 100

## 2021-10-26 MED ORDER — SODIUM CHLORIDE 0.9 % IV SOLN: INTRAVENOUS | Status: DC | PRN

## 2021-10-26 MED ORDER — VANCOMYCIN HCL 1500 MG/300ML IV SOLN
1500.0000 mg | Freq: Two times a day (BID) | INTRAVENOUS | Status: DC
  Administered 2021-10-26 – 2021-10-30 (×9): 1500 mg via INTRAVENOUS
  Filled 2021-10-26 (×10): qty 300

## 2021-10-26 MED ORDER — SODIUM CHLORIDE 0.9 % IV SOLN
2.0000 g | Freq: Once | INTRAVENOUS | Status: AC
  Administered 2021-10-26: 2 g via INTRAVENOUS
  Filled 2021-10-26: qty 12.5

## 2021-10-26 MED ORDER — INSULIN ASPART 100 UNIT/ML IJ SOLN
0.0000 [IU] | INTRAMUSCULAR | Status: DC
  Administered 2021-10-26: 2 [IU] via SUBCUTANEOUS
  Administered 2021-10-26: 1 [IU] via SUBCUTANEOUS
  Administered 2021-10-26: 7 [IU] via SUBCUTANEOUS
  Administered 2021-10-26: 3 [IU] via SUBCUTANEOUS
  Administered 2021-10-27: 2 [IU] via SUBCUTANEOUS
  Administered 2021-10-27: 3 [IU] via SUBCUTANEOUS
  Administered 2021-10-27 – 2021-10-28 (×4): 5 [IU] via SUBCUTANEOUS
  Administered 2021-10-28: 9 [IU] via SUBCUTANEOUS
  Administered 2021-10-28 (×2): 7 [IU] via SUBCUTANEOUS
  Filled 2021-10-26: qty 0.09

## 2021-10-26 NOTE — Progress Notes (Signed)
A consult was received from an ED physician for Vancomycin & Cefepime per pharmacy dosing.  The patient's profile has been reviewed for ht/wt/allergies/indication/available labs.   A one time order has been placed for Cefepime 2gm & Vancomycin 2gm IV.  Further antibiotics/pharmacy consults should be ordered by admitting physician if indicated.                       Thank you, Netta Cedars PharmD 10/26/2021  12:08 AM

## 2021-10-26 NOTE — H&P (Signed)
History and Physical    [name] YNW:[id number] DOB: 15-Feb-1980 DOA: 10/25/2021  PCP: Kerin Perna, NP  Patient coming from: Home  Chief Complaint: Right foot infection  HPI: [name] is a 41 y.o. 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    CT of right foot with contrast done on 10/22/2021: IMPRESSION: 1. Postsurgical changes from prior transmetatarsal amputation of the first through fifth rays at the level of the metatarsal bases. Erosion of the residual fourth metatarsal base suggestive of osteomyelitis, which may be acute on chronic. 2. Soft tissue wound/ulceration at the plantar-medial aspect of the distal stump with extensive surrounding cellulitis. There is a small irregular rim enhancing 1.5 cm fluid pocket communicating to the overlying soft tissues via an enhancing sinus tract, compatible with a small abscess.  Patient was seen at wound care office yesterday and sent to the ED for further evaluation.  In the ED, vital signs stable.  Labs showing no leukocytosis, glucose 411 without signs of DKA, blood cultures drawn. Patient was given vancomycin, cefepime, metronidazole, NovoLog 6 units, and 1 L normal saline bolus.  Patient reports increasing pain and drainage from her right foot lately.  States she had a CT done 4 days ago which was concerning for an abscess and her orthopedic physician Dr. Kathaleen Bury called her yesterday and advised her to come into the hospital for further treatment.  She reports not watching her diet and eating desserts and on some days she forgets to take her home insulin.  States she was previously on Lantus 60 units daily and last month her PCP  increased the dose to 70 units daily.  She does not check her blood glucose at home.  She is also on metformin.  Patient has no other complaints.  Denies fevers, cough, shortness of breath, chest pain, nausea, vomiting, abdominal pain, or diarrhea.  Review of Systems:  Review of Systems  All other systems reviewed and are negative.   Past Medical History:  Diagnosis Date   Class 3 obesity ([place]) 12/16/2020   Diabetes mellitus     Past Surgical History:  Procedure Laterality Date   EXTREMITY CYST EXCISION     MINOR IRRIGATION AND DEBRIDEMENT OF WOUND Right 03/10/2021   Procedure: MINOR IRRIGATION AND DEBRIDEMENT OF WOUND;  Surgeon: Armond Hang, MD;  Location: [facility];  Service: Orthopedics;  Laterality: Right;   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   Procedure: TRANSMETATARSAL AMPUTATION;  Surgeon: Armond Hang, MD;  Location: [facility];  Service: Orthopedics;  Laterality: Right;     reports that she has quit smoking. Her smoking use included cigarettes. She does not have any smokeless tobacco history on file. She reports current alcohol use. She reports current drug use. Drug: Marijuana.  No Known Allergies  Family History  Problem Relation Age of Onset   Hypertension Mother    Diabetes Mother    Diabetes Other     Prior to Admission medications   Medication Sig Start Date End Date Taking? Authorizing Provider  apixaban (ELIQUIS) 5 MG TABS tablet Take 1 tablet (49m) by mouth twice daily. 06/26/21  Yes EKerin Perna NP  Dulaglutide (TRULICITY) 1.5 MMV/7.8IOSOPN Inject 1.5 mg into the skin once a week. 10/09/21  Yes NCharlott Rakes MD  gabapentin (  NEURONTIN) 300 MG capsule Take 2 capsules by mouth 3 times daily. 05/07/21  Yes Charlott Rakes, MD  gentamicin ointment (GARAMYCIN) 0.1 % Apply 1 Application topically daily. 09/21/21  Yes   insulin glargine (LANTUS SOLOSTAR) 100 UNIT/ML Solostar Pen Inject 70 Units into the skin daily. 10/09/21  Yes Newlin, Charlane Ferretti, MD   lisinopril (ZESTRIL) 2.5 MG tablet Take 1 tablet (2.5 mg total) by mouth daily. 10/09/21  Yes Charlott Rakes, MD  metFORMIN (GLUCOPHAGE-XR) 500 MG 24 hr tablet Take 2 tablets (1,000 mg total) by mouth 2 (two) times daily. 06/26/21  Yes Kerin Perna, NP  ondansetron (ZOFRAN) 4 MG tablet Take 1 tablet (4 mg total) by mouth every 6 (six) hours as needed for nausea. 12/23/20  Yes Eugenie Filler, MD  oxyCODONE (OXY IR/ROXICODONE) 5 MG immediate release tablet Take 5 mg by mouth every 6 (six) hours as needed for moderate pain or severe pain. 03/13/21  Yes [provider]  acetaminophen (TYLENOL) 325 MG tablet Take 2 tablets (650 mg total) by mouth every 6 (six) hours as needed for mild pain (or Fever >/= 101). Patient not taking: Reported on 10/26/2021 12/23/20   Eugenie Filler, MD  atorvastatin (LIPITOR) 80 MG tablet Take 1 tablet (80 mg total) by mouth once daily. 10/18/21   Kerin Perna, NP  Blood Glucose Monitoring Suppl (TRUE METRIX METER) w/Device KIT Use to check blood sugar twice a day. 02/14/21   Charlott Rakes, MD  cefadroxil (DURICEF) 500 MG capsule Take 2 capsules (1,000 mg total) by mouth 2 (two) times daily. Patient not taking: Reported on 10/26/2021 03/27/21   Rosiland Oz, MD  glucose blood (TRUE METRIX BLOOD GLUCOSE TEST) test strip Use to check blood sugar twice a day. 02/14/21   Charlott Rakes, MD  Insulin Pen Needle 32G X 4 MM MISC use as directed 10/09/21   Charlott Rakes, MD  sulfamethoxazole-trimethoprim (BACTRIM DS) 800-160 MG tablet Take 1 tablet by mouth twice daily for 7 days Patient not taking: Reported on 10/26/2021 09/11/21     TRUEplus Lancets 28G MISC Use to check blood sugar twice a day. 02/14/21   Charlott Rakes, MD    Physical Exam: Vitals:   10/26/21 0233 10/26/21 0320 10/26/21 0345 10/26/21 0406  BP: 132/64 135/79  110/67  Pulse: 86 98  93  Resp: 18   19  Temp:   98.8 F (37.1 C)   TempSrc:   Oral   SpO2: 98% 100%  100%  Weight:       Height:        Physical Exam Vitals reviewed.  Constitutional:      General: She is not in acute distress. HENT:     Head: Normocephalic and atraumatic.  Eyes:     Extraocular Movements: Extraocular movements intact.  Cardiovascular:     Rate and Rhythm: Normal rate and regular rhythm.     Pulses: Normal pulses.  Pulmonary:     Effort: Pulmonary effort is normal. No respiratory distress.     Breath sounds: Normal breath sounds. No wheezing or rales.  Abdominal:     General: Bowel sounds are normal. There is no distension.     Palpations: Abdomen is soft.     Tenderness: There is no abdominal tenderness.  Musculoskeletal:     Cervical back: Normal range of motion.     Comments: Right foot: Dorsalis pedis pulse intact.  No active drainage from amputation stump.  See images.  Skin:    General: Skin  is warm and dry.  Neurological:     General: No focal deficit present.     Mental Status: She is alert and oriented to person, place, and time.          Labs on Admission: I have personally reviewed following labs and imaging studies  CBC: Recent Labs  Lab 10/26/21 0041  WBC 9.4  NEUTROABS 5.9  HGB 12.1  HCT 37.4  MCV 83.9  PLT 856   Basic Metabolic Panel: Recent Labs  Lab 10/26/21 0041  NA 134*  K 4.0  CL 101  CO2 22  GLUCOSE 411*  BUN 12  CREATININE 0.80  CALCIUM 8.8*   GFR: Estimated Creatinine Clearance: 142.1 mL/min (by C-G formula based on SCr of 0.8 mg/dL). Liver Function Tests: Recent Labs  Lab 10/26/21 0041  AST 17  ALT 7  ALKPHOS 111  BILITOT 0.4  PROT 7.0  ALBUMIN 3.2*   No results for input(s): "LIPASE", "AMYLASE" in the last 168 hours. No results for input(s): "AMMONIA" in the last 168 hours. Coagulation Profile: No results for input(s): "INR", "PROTIME" in the last 168 hours. Cardiac Enzymes: No results for input(s): "CKTOTAL", "CKMB", "CKMBINDEX", "TROPONINI" in the last 168 hours. BNP (last 3 results) No results for  input(s): "PROBNP" in the last 8760 hours. HbA1C: No results for input(s): "HGBA1C" in the last 72 hours. CBG: No results for input(s): "GLUCAP" in the last 168 hours. Lipid Profile: No results for input(s): "CHOL", "HDL", "LDLCALC", "TRIG", "CHOLHDL", "LDLDIRECT" in the last 72 hours. Thyroid Function Tests: No results for input(s): "TSH", "T4TOTAL", "FREET4", "T3FREE", "THYROIDAB" in the last 72 hours. Anemia Panel: No results for input(s): "VITAMINB12", "FOLATE", "FERRITIN", "TIBC", "IRON", "RETICCTPCT" in the last 72 hours. Urine analysis: No results found for: "COLORURINE", "APPEARANCEUR", "LABSPEC", "PHURINE", "GLUCOSEU", "HGBUR", "BILIRUBINUR", "KETONESUR", "PROTEINUR", "UROBILINOGEN", "NITRITE", "LEUKOCYTESUR"  Radiological Exams on Admission: No results found.  EKG: EKG ordered and currently pending.  Assessment and Plan  Diabetic right foot infection Patient with history of right foot osteomyelitis status post transmetatarsal amputation in December 2022 with subsequent wound dehiscence and I&D in February 2023.  Presenting with complaints of increasing foot pain and drainage. Outpatient CT done 4 days ago which showed findings concerning for acute on chronic osteomyelitis, cellulitis, and a small abscess.  No fever, leukocytosis, or signs of sepsis.  She was sent here by her orthopedics (Dr. Kathaleen Bury). -Continue vancomycin, cefepime, metronidazole -Pain management -Blood cultures drawn -MRI of the right foot ordered  Poorly controlled insulin-dependent type 2 diabetes Suspect due to medication/dietary noncompliance and infection.  Glucose >400 without signs of DKA.  A1c 8.5 on 10/09/2021.  Patient received NovoLog 6 units and 1 L normal saline bolus in the ED. -Continue home basal insulin at 1/2 of usual dose patient is currently n.p.o. (Semglee 35 units daily) -Sensitive sliding scale insulin every 4 hours  Hypertension Stable. -Continue  lisinopril  Hyperlipidemia -Continue Lipitor  History of right lower extremity DVT in January 2023 -Hold Eliquis until patient is seen by orthopedics  Class III obesity -Encourage lifestyle modifications such as healthy diet and exercise.  DVT prophylaxis: Hold Eliquis until patient is seen by orthopedics. Code Status: Full Code Family Communication: No family at bedside. Level of care: Med-Surg Admission status: It is my clinical opinion that admission to INPATIENT is reasonable and necessary because of the expectation that this patient will require hospital care that crosses at least 2 midnights to treat this condition based on the medical complexity of the problems presented.  Given the aforementioned information, the predictability of an adverse outcome is felt to be significant.   Shela Leff MD [facility]  If 7PM-7AM, please contact night-coverage [email or web address]  10/26/2021, 5:04 AM

## 2021-10-26 NOTE — ED Notes (Signed)
Pt back to room from MRI.

## 2021-10-26 NOTE — ED Provider Notes (Signed)
[place] DEPT Provider Note   CSN: [id number] Arrival date & time: 10/25/21  2332     History  Chief Complaint  Patient presents with   Abscess    [name] is a 41 y.o. female with a hx of diabetes mellitus and prior right foot osteomyelitis S/p transmetatarsal amputation who presents to the ED with complaints of right foot wound and recommendation for admission.  Patient reports chronic right foot wound, followed by wound care, has had some increased drainage and pain recently, had CT scan w/ findings concerning for abscess. Her wound care team contacted her orthopedic surgeon's office, Dr. Kathaleen Bury, she received a call from the orthopedics office and was advised to come to the ED for MRI and admission for surgical planning. She denies fever, chills, nausea or vomiting.   HPI     Home Medications Prior to Admission medications   Medication Sig Start Date End Date Taking? Authorizing Provider  acetaminophen (TYLENOL) 325 MG tablet Take 2 tablets (650 mg total) by mouth every 6 (six) hours as needed for mild pain (or Fever >/= 101). 12/23/20   Eugenie Filler, MD  apixaban (ELIQUIS) 5 MG TABS tablet Take 1 tablet (40m) by mouth twice daily. 06/26/21   EKerin Perna NP  atorvastatin (LIPITOR) 80 MG tablet Take 1 tablet (80 mg total) by mouth once daily. 10/18/21   EKerin Perna NP  Blood Glucose Monitoring Suppl (TRUE METRIX METER) w/Device KIT Use to check blood sugar twice a day. 02/14/21   NCharlott Rakes MD  cefadroxil (DURICEF) 500 MG capsule Take 2 capsules (1,000 mg total) by mouth 2 (two) times daily. 03/27/21   MRosiland Oz MD  Dulaglutide (TRULICITY) 1.5 MRS/8.5IOSOPN Inject 1.5 mg into the skin once a week. 10/09/21   NCharlott Rakes MD  gabapentin (NEURONTIN) 300 MG capsule Take 2 capsules by mouth 3 times daily. 05/07/21   NCharlott Rakes MD  gentamicin ointment (GARAMYCIN) 0.1 % Apply 1 Application topically  daily. 09/21/21     glucose blood (TRUE METRIX BLOOD GLUCOSE TEST) test strip Use to check blood sugar twice a day. 02/14/21   NCharlott Rakes MD  insulin glargine (LANTUS SOLOSTAR) 100 UNIT/ML Solostar Pen Inject 70 Units into the skin daily. 10/09/21   NCharlott Rakes MD  Insulin Pen Needle 32G X 4 MM MISC use as directed 10/09/21   NCharlott Rakes MD  lisinopril (ZESTRIL) 2.5 MG tablet Take 1 tablet (2.5 mg total) by mouth daily. 10/09/21   NCharlott Rakes MD  metFORMIN (GLUCOPHAGE-XR) 500 MG 24 hr tablet Take 2 tablets (1,000 mg total) by mouth 2 (two) times daily. 06/26/21   EKerin Perna NP  ondansetron (ZOFRAN) 4 MG tablet Take 1 tablet (4 mg total) by mouth every 6 (six) hours as needed for nausea. 12/23/20   TEugenie Filler MD  oxyCODONE (OXY IR/ROXICODONE) 5 MG immediate release tablet Take 5 mg by mouth. 03/13/21   [provider]  sulfamethoxazole-trimethoprim (BACTRIM DS) 800-160 MG tablet Take 1 tablet by mouth twice daily for 7 days 09/11/21     TRUEplus Lancets 28G MISC Use to check blood sugar twice a day. 02/14/21   NCharlott Rakes MD      Allergies    Patient has no known allergies.    Review of Systems   Review of Systems  Constitutional:  Negative for chills and fever.  Respiratory:  Negative for shortness of breath.   Cardiovascular:  Negative for chest pain.  Gastrointestinal:  Negative for abdominal pain.  Musculoskeletal:  Positive for arthralgias.  Skin:  Positive for wound.  All other systems reviewed and are negative.   Physical Exam Updated Vital Signs BP (!) 155/108 (BP Location: Right Arm)   Pulse 96   Temp 99.3 F (37.4 C) (Oral)   Resp 17   Ht _0  (1.753 m)   Wt (!) 141.5 kg   SpO2 100%   BMI 46.07 kg/m  Physical Exam Vitals and nursing note reviewed.  Constitutional:      General: She is not in acute distress.    Appearance: She is well-developed.  HENT:     Head: Normocephalic and atraumatic.  Eyes:     General:         Right eye: No discharge.        Left eye: No discharge.     Conjunctiva/sclera: Conjunctivae normal.  Cardiovascular:     Rate and Rhythm: Normal rate and regular rhythm.     Comments: 2+ Dp pulses.  Pulmonary:     Effort: Pulmonary effort is normal. No respiratory distress.  Musculoskeletal:     Comments: Right lower extremity: Transmetatarsal amputation.  Wound present to the distal aspect of the amputation site as pictured below.  Tender to palpation throughout the foot.   Skin:    General: Skin is warm and dry.  Neurological:     Mental Status: She is alert.     Comments: Clear speech.   Psychiatric:        Behavior: Behavior normal.        Thought Content: Thought content normal.          ED Results / Procedures / Treatments   Labs (all labs ordered are listed, but only abnormal results are displayed) Labs Reviewed  CULTURE, BLOOD (ROUTINE X 2)  CULTURE, BLOOD (ROUTINE X 2)  CBC WITH DIFFERENTIAL/PLATELET  COMPREHENSIVE METABOLIC PANEL  I-STAT BETA HCG BLOOD, ED ([facility], [facility], [facility] ONLY)    EKG None  Radiology No results found.  Procedures Procedures    Medications Ordered in ED Medications  metroNIDAZOLE (FLAGYL) IVPB 500 mg (has no administration in time range)  fentaNYL (SUBLIMAZE) injection 50 mcg (has no administration in time range)  ceFEPIme (MAXIPIME) 2 g in sodium chloride 0.9 % 100 mL IVPB (has no administration in time range)  vancomycin (VANCOREADY) IVPB 2000 mg/400 mL (has no administration in time range)    ED Course/ Medical Decision Making/ A&P                           Medical Decision Making Amount and/or Complexity of Data Reviewed Labs: ordered. Radiology: ordered.  Risk Prescription drug management. Decision regarding hospitalization.   Patient presents to the ED for further management of right foot wound, this involves an extensive number of treatment options, and is a complaint that carries with it a high risk of complications  and morbidity. Nontoxic, vitals w/ mildly elevated BP on arrival.    Additional history obtained:  Chart/nursing notes reviewed.  External records viewed including: CT right foot with contrast from 10/22/2021: 1. Postsurgical changes from prior transmetatarsal amputation of the first through fifth rays at the level of the metatarsal bases. Erosion of the residual fourth metatarsal base suggestive of osteomyelitis, which may be acute on chronic. 2. Soft tissue wound/ulceration at the plantar-medial aspect of the distal stump with extensive surrounding cellulitis. There is a small irregular rim enhancing 1.5  cm fluid pocket communicating to the overlying soft tissues via an enhancing sinus tract, compatible with a small abscess.  Lab Tests:  I viewed & interpreted labs including:  CBC: Unremarkable CMP: Hyperglycemia without acidosis or anion gap elevation  ED Course:  MRI and blood cultures ordered I ordered medications including fentanyl as needed for pain as well as vancomycin, cefepime, and Flagyl as this is what patient was receiving during her most recent hospital admission for infection of the right foot.   Discuss with medicine for admission.  02:13: CONSULT: I discussed with hospitalist Dr. Marlowe Sax who accepts admission, day team may be seeing patient for admission for request we continue management, I ordered fluids as well as insulin for patient's hyperglycemia which she is in agreement with.  Findings and plan of care discussed with supervising physician Dr. Roxanne Mins who is in agreement.   Portions of this note were generated with [employer]. Dictation errors may occur despite best attempts at proofreading.   Final Clinical Impression(s) / ED Diagnoses Final diagnoses:  Osteomyelitis of right foot, unspecified type [facility])  Hyperglycemia    Rx / DC Orders ED Discharge Orders     None         Amaryllis Dyke, PA-C 79/72/82 0601    Delora Fuel, MD 56/15/37 [phone]

## 2021-10-26 NOTE — Anesthesia Preprocedure Evaluation (Signed)
Anesthesia Evaluation  Patient identified by MRN, date of birth, ID band Patient awake    Reviewed: Allergy & Precautions, NPO status , Patient's Chart, lab work & pertinent test results  History of Anesthesia Complications Negative for: history of anesthetic complications  Airway Mallampati: II  TM Distance: >3 FB Neck ROM: Full    Dental  (+) Edentulous Upper, Edentulous Lower   Pulmonary Current Smoker and Patient abstained from smoking., former smoker,    Pulmonary exam normal        Cardiovascular hypertension, + DVT  Normal cardiovascular exam     Neuro/Psych negative neurological ROS  negative psych ROS   GI/Hepatic negative GI ROS, (+)     substance abuse  marijuana use,   Endo/Other  diabetes, Type 2, Insulin DependentMorbid obesity  Renal/GU negative Renal ROS     Musculoskeletal negative musculoskeletal ROS (+)   Abdominal   Peds  Hematology  On eliquis    Anesthesia Other Findings   Reproductive/Obstetrics                           Anesthesia Physical Anesthesia Plan  ASA: 3  Anesthesia Plan: General   Post-op Pain Management: Tylenol PO (pre-op)*   Induction: Intravenous and Rapid sequence  PONV Risk Score and Plan: 2 and Treatment may vary due to age or medical condition, Ondansetron and Midazolam  Airway Management Planned: Oral ETT  Additional Equipment: None  Intra-op Plan:   Post-operative Plan: Extubation in OR  Informed Consent: I have reviewed the patients History and Physical, chart, labs and discussed the procedure including the risks, benefits and alternatives for the proposed anesthesia with the patient or authorized representative who has indicated his/her understanding and acceptance.     Dental advisory given  Plan Discussed with: CRNA and Anesthesiologist  Anesthesia Plan Comments: (GETA given recent GLP-1 agonist use )        Anesthesia Quick Evaluation

## 2021-10-26 NOTE — ED Notes (Signed)
PT alert, NAD, calm, interactive, resps e/u, speaking in clear complete sentences.

## 2021-10-26 NOTE — Progress Notes (Signed)
Pharmacy Antibiotic Note  [name] is a 41 y.o. female admitted on 10/25/2021 with diabetic foot infection.  Pharmacy has been consulted for Vancomycin & Cefepime dosing.  Plan: Cefepime 2gm IV q8h Vancomycin 1500mg  IV q12h to target AUC 400-550.  Estimated AUC 496. Check Vancomycin levels at steady state Monitor renal function and cx data   Height: 5\' 9"  (175.3 cm) Weight: (!) 141.5 kg (312 lb) IBW/kg (Calculated) : 66.2  Temp (24hrs), Avg:99.1 F (37.3 C), Min:98.8 F (37.1 C), Max:99.3 F (37.4 C)  Recent Labs  Lab 10/26/21 0041  WBC 9.4  CREATININE 0.80    Estimated Creatinine Clearance: 142.1 mL/min (by C-G formula based on SCr of 0.8 mg/dL).    No Known Allergies  Antimicrobials this admission: 10/13 Cefepime >>  10/13 Vancomycin >>  10/13 Metronidazole >>  Dose adjustments this admission:  Microbiology results: 10/13 BCx:   Thank you for allowing pharmacy to be a part of this patient's care.  Netta Cedars PharmD 10/26/2021 6:16 AM

## 2021-10-26 NOTE — Care Plan (Signed)
Orthopaedic Surgery Plan of Care Note  -OR Sat 10/14 AM  -planned for right foot I&D -reviewed MRI with patient -IV abx per primary/ID -NPO from [place], hold VTE ppx -last dose Eliquis on Tues 10/10 per pt -preop orders placed  Armond Hang, MD Orthopaedic Surgery [facility]

## 2021-10-26 NOTE — Plan of Care (Signed)
°  Problem: Coping: °Goal: Level of anxiety will decrease °Outcome: Progressing °  °

## 2021-10-26 NOTE — Plan of Care (Signed)
  Problem: Education: Goal: Knowledge of General Education information will improve Description Including pain rating scale, medication(s)/side effects and non-pharmacologic comfort measures Outcome: Progressing   

## 2021-10-26 NOTE — ED Notes (Signed)
Patient transported to MRI via stretcher.

## 2021-10-26 NOTE — Progress Notes (Signed)
Inpatient Diabetes Program Recommendations  AACE/ADA: New Consensus Statement on Inpatient Glycemic Control (2015)  Target Ranges:  Prepandial:   less than 140 mg/dL      Peak postprandial:   less than 180 mg/dL (1-2 hours)      Critically ill patients:  140 - 180 mg/dL   Lab Results  Component Value Date   GLUCAP 214 (H) 10/26/2021   HGBA1C 8.5 (A) 10/09/2021    Review of Glycemic Control  Latest Reference Range & Units 10/26/21 10:06  Glucose-Capillary 70 - 99 mg/dL 214 (H)   Diabetes history: DM 2 Outpatient Diabetes medications:  Trulicity 1.5 weekly, Metformin 1000 mg bid, Lantus 70 units daily Current orders for Inpatient glycemic control:  Novolog 0-9 units q 4 hours Semglee 35 units daily  Inpatient Diabetes Program Recommendations:    Agree with orders.  Will follow.  Last A1C=8.5% indicating average blood sugars of 195 mg/dL.    Thanks,  Adah Perl, RN, BC-ADM Inpatient Diabetes Coordinator Pager [phone]  (8a-5p)

## 2021-10-26 NOTE — ED Notes (Signed)
Preparing to transport to floor, floor/ RN messaged/ notified.

## 2021-10-26 NOTE — Progress Notes (Signed)
PROGRESS NOTE    [name]  [id number] DOB: 1980-03-15 DOA: 10/25/2021 PCP: Kerin Perna, NP    Brief Narrative:  41 y/o female with history of IDDM, HTN, HLD, Obesity class 3, prior right transmetatarsal amputation, admitted with right foot infection and possible abscess. Orthopedics consulted. She is on IV antibiotics.    Assessment & Plan:   Principal Problem:   Diabetic foot infection ([place]) Active Problems:   Type 2 diabetes mellitus with hyperglycemia (HCC)   Class 3 obesity (HCC)   Hyperlipidemia   HTN (hypertension)   Deep vein thrombosis (DVT) of right lower extremity (HCC)   Diabetic right foot infection -pre existing right foot TMA -CT/MRI show possible abscess in midfoot, no clear osteomyelitis -she is on IV antibiotics -orthopedics following -possible surgical intervention on 10/14 -NPO after midnight  Insulin dependent DM, uncontrolled with hyperglycemia -patient takes metformin, lantus and trulicity prior to admission -admitted with hyperglycemia -continue on lantus and SSI -counseled on importance of dietary compliance  HTN -continue lisinopril  HLD -continue statin  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, on hold for surgery -resume once cleared by orthopedics  Obesity class 3 -BMI 46.07 -discussed with patient importance of physical activity and healthy eating habits -discussed that she should consider bariatric surgery evaluation due to her elevated BMI and difficult to control DM, not only for weight loss but also for metabolic improvements     DVT prophylaxis:   Code Status: full code Family Communication: discussed with patient Disposition Plan: Status is: Inpatient Remains inpatient appropriate because: needs ortho management, surgical intervention     Consultants:  orthopedics  Procedures:    Antimicrobials:  Vancomycin 10/13> Cefepime 10/13> Flagyl 10/13>    Subjective: Sitting up in bed,  appears comfortable, has questions about possible surgery  Objective: Vitals:   10/26/21 0716 10/26/21 0804 10/26/21 1103 10/26/21 1348  BP: 133/70 139/82 (!) 147/89 134/65  Pulse: 81 77 82 71  Resp: 18 14  16   Temp: 99 F (37.2 C) 98.2 F (36.8 C)  98.6 F (37 C)  TempSrc: Oral Oral    SpO2: 97% 100%  99%  Weight:      Height:        Intake/Output Summary (Last 24 hours) at 10/26/2021 1447 Last data filed at 10/26/2021 1000 Gross per 24 hour  Intake 1500 ml  Output --  Net 1500 ml   Filed Weights   10/25/21 2339  Weight: (!) 141.5 kg    Examination:  General exam: Appears calm and comfortable  Respiratory system: Clear to auscultation. Respiratory effort normal. Cardiovascular system: S1 & S2 heard, RRR. No JVD, murmurs, rubs, gallops or clicks. No pedal edema. Gastrointestinal system: Abdomen is nondistended, soft and nontender. No organomegaly or masses felt. Normal bowel sounds heard. Central nervous system: Alert and oriented. No focal neurological deficits. Extremities: Symmetric 5 x 5 power. Skin: No rashes, lesions or ulcers Psychiatry: Judgement and insight appear normal. Mood & affect appropriate.     Data Reviewed: I have personally reviewed following labs and imaging studies  CBC: Recent Labs  Lab 10/26/21 0041  WBC 9.4  NEUTROABS 5.9  HGB 12.1  HCT 37.4  MCV 83.9  PLT [id number]   Basic Metabolic Panel: Recent Labs  Lab 10/26/21 0041  NA 134*  K 4.0  CL 101  CO2 22  GLUCOSE 411*  BUN 12  CREATININE 0.80  CALCIUM 8.8*   GFR: Estimated Creatinine Clearance: 142.1 mL/min (by C-G formula based on  SCr of 0.8 mg/dL). Liver Function Tests: Recent Labs  Lab 10/26/21 0041  AST 17  ALT 7  ALKPHOS 111  BILITOT 0.4  PROT 7.0  ALBUMIN 3.2*   No results for input(s): "LIPASE", "AMYLASE" in the last 168 hours. No results for input(s): "AMMONIA" in the last 168 hours. Coagulation Profile: No results for input(s): "INR", "PROTIME" in the last  168 hours. Cardiac Enzymes: No results for input(s): "CKTOTAL", "CKMB", "CKMBINDEX", "TROPONINI" in the last 168 hours. BNP (last 3 results) No results for input(s): "PROBNP" in the last 8760 hours. HbA1C: No results for input(s): "HGBA1C" in the last 72 hours. CBG: Recent Labs  Lab 10/26/21 1006 10/26/21 1236  GLUCAP 214* 185*   Lipid Profile: No results for input(s): "CHOL", "HDL", "LDLCALC", "TRIG", "CHOLHDL", "LDLDIRECT" in the last 72 hours. Thyroid Function Tests: No results for input(s): "TSH", "T4TOTAL", "FREET4", "T3FREE", "THYROIDAB" in the last 72 hours. Anemia Panel: No results for input(s): "VITAMINB12", "FOLATE", "FERRITIN", "TIBC", "IRON", "RETICCTPCT" in the last 72 hours. Sepsis Labs: No results for input(s): "PROCALCITON", "LATICACIDVEN" in the last 168 hours.  Recent Results (from the past 240 hour(s))  Blood culture (routine x 2)     Status: None (Preliminary result)   Collection Time: 10/26/21 12:41 AM   Specimen: BLOOD  Result Value Ref Range Status   Specimen Description   Final    BLOOD SITE NOT SPECIFIED Performed at [facility] [address], [place], [place] [address]    Special Requests   Final    BOTTLES DRAWN AEROBIC AND ANAEROBIC Blood Culture adequate volume Performed at [facility] [address], [place], [place] [address]    Culture   Final    NO GROWTH < 12 HOURS Performed at [facility] [address], [place], [place] [address]    Report Status PENDING  Incomplete  Blood culture (routine x 2)     Status: None (Preliminary result)   Collection Time: 10/26/21 12:53 AM   Specimen: BLOOD  Result Value Ref Range Status   Specimen Description   Final    BLOOD SITE NOT SPECIFIED Performed at [facility] [address], [place], [place] [address]    Special Requests   Final    BOTTLES DRAWN AEROBIC AND ANAEROBIC Blood Culture adequate volume Performed at [facility] [address] [place], [place] [address]    Culture   Final    NO GROWTH < 12 HOURS Performed at [facility] [address] [place], [place] [address]    Report Status PENDING  Incomplete         Radiology Studies: MR FOOT RIGHT W WO CONTRAST  Result Date: 10/26/2021 CLINICAL DATA:  Medial foot wound post transmetatarsal amputation in 2022. EXAM: MRI OF THE RIGHT FOREFOOT WITHOUT AND WITH CONTRAST TECHNIQUE: Multiplanar, multisequence MR imaging of the right foot was performed before and after the administration of intravenous contrast. CONTRAST:  2mL GADAVIST GADOBUTROL 1 MMOL/ML IV SOLN COMPARISON:  CT 10/22/2021, radiographs 03/08/2021 and MRI 02/27/2021. FINDINGS: Bones/Joint/Cartilage Status post amputation through the metatarsal bases. Previously demonstrated bone marrow edema within the metatarsal stumps and medial cuneiform has nearly resolved. No progressive cortical destruction or suspicious marrow enhancement identified. The additional bones of the hindfoot and midfoot appear unremarkable. No significant joint effusions or abnormal synovial enhancement. Ligaments Intact Lisfranc ligament. Muscles and Tendons Mild T2 hyperintensity and enhancement within the plantar musculature, improved from the previous study.  No significant tenosynovitis. Soft tissues As seen on recent CT, there is a soft tissue wound along the plantar medial aspect of the midfoot with associated skin thickening and surrounding soft tissue enhancement. Small rim enhancing fluid collection measuring up to 1.4 cm in diameter is unchanged from the recent CT and communicates with the wound. No new or enlarging fluid collections are identified. No evidence of foreign body. IMPRESSION: 1. Persistent soft tissue wound along the plantar medial aspect of the midfoot with a small rim enhancing fluid collection which communicates with the wound. Findings are unchanged from recent CT. 2. No evidence  of osteomyelitis or septic joint. 3. Interval improvement in previously demonstrated bone marrow edema within the metatarsal stumps and medial cuneiform status post amputation through the metatarsal bases. Electronically Signed   By: Richardean Sale M.D.   On: 10/26/2021 08:25        Scheduled Meds:  atorvastatin  80 mg Oral Daily   gabapentin  600 mg Oral TID   insulin aspart  0-9 Units Subcutaneous Q4H   insulin glargine-yfgn  35 Units Subcutaneous Daily   lisinopril  2.5 mg Oral Daily   Continuous Infusions:  ceFEPime (MAXIPIME) IV 2 g (10/26/21 1123)   metronidazole 500 mg (10/26/21 1336)   vancomycin       LOS: 0 days    Time spent: 93mins    Kathie Dike, MD [facility]   If 7PM-7AM, please contact night-coverage [email or web address]  10/26/2021, 2:47 PM

## 2021-10-26 NOTE — ED Notes (Signed)
MD in room with pt at this time. MRI tech waiting outside of room to take pt to MRI

## 2021-10-27 ENCOUNTER — Inpatient Hospital Stay (HOSPITAL_COMMUNITY): Payer: Medicaid Other | Admitting: Anesthesiology

## 2021-10-27 ENCOUNTER — Encounter (HOSPITAL_COMMUNITY)
Admission: EM | Disposition: A | Discharge status: Home / Self Care | Payer: Self-pay | Source: Home / Self Care | Attending: Internal Medicine

## 2021-10-27 ENCOUNTER — Other Ambulatory Visit: Payer: Self-pay

## 2021-10-27 DIAGNOSIS — Z6841 Body Mass Index (BMI) 40.0 and over, adult: Secondary | ICD-10-CM

## 2021-10-27 DIAGNOSIS — R739 Hyperglycemia, unspecified: Secondary | ICD-10-CM

## 2021-10-27 DIAGNOSIS — T8789 Other complications of amputation stump: Secondary | ICD-10-CM | POA: Diagnosis not present

## 2021-10-27 DIAGNOSIS — I10 Essential (primary) hypertension: Secondary | ICD-10-CM | POA: Diagnosis not present

## 2021-10-27 DIAGNOSIS — Z87891 Personal history of nicotine dependence: Secondary | ICD-10-CM

## 2021-10-27 DIAGNOSIS — E1165 Type 2 diabetes mellitus with hyperglycemia: Secondary | ICD-10-CM

## 2021-10-27 DIAGNOSIS — L02611 Cutaneous abscess of right foot: Secondary | ICD-10-CM | POA: Diagnosis not present

## 2021-10-27 DIAGNOSIS — E663 Overweight: Secondary | ICD-10-CM

## 2021-10-27 DIAGNOSIS — E11628 Type 2 diabetes mellitus with other skin complications: Secondary | ICD-10-CM | POA: Diagnosis not present

## 2021-10-27 DIAGNOSIS — L089 Local infection of the skin and subcutaneous tissue, unspecified: Secondary | ICD-10-CM | POA: Diagnosis not present

## 2021-10-27 DIAGNOSIS — Z86718 Personal history of other venous thrombosis and embolism: Secondary | ICD-10-CM

## 2021-10-27 HISTORY — PX: INCISION AND DRAINAGE OF WOUND: SHX1803

## 2021-10-27 LAB — GLUCOSE, CAPILLARY
Glucose-Capillary: 158 mg/dL — ABNORMAL HIGH (ref 70–99)
Glucose-Capillary: 198 mg/dL — ABNORMAL HIGH (ref 70–99)
Glucose-Capillary: 224 mg/dL — ABNORMAL HIGH (ref 70–99)
Glucose-Capillary: 262 mg/dL — ABNORMAL HIGH (ref 70–99)
Glucose-Capillary: 285 mg/dL — ABNORMAL HIGH (ref 70–99)
Glucose-Capillary: 285 mg/dL — ABNORMAL HIGH (ref 70–99)
Glucose-Capillary: 433 mg/dL — ABNORMAL HIGH (ref 70–99)

## 2021-10-27 LAB — SURGICAL PCR SCREEN
MRSA, PCR: NEGATIVE
Staphylococcus aureus: NEGATIVE

## 2021-10-27 SURGERY — IRRIGATION AND DEBRIDEMENT WOUND
Anesthesia: General | Site: Foot | Laterality: Right

## 2021-10-27 MED ORDER — ACETAMINOPHEN 500 MG PO TABS
1000.0000 mg | ORAL_TABLET | Freq: Once | ORAL | Status: AC
  Administered 2021-10-27: 1000 mg via ORAL

## 2021-10-27 MED ORDER — INSULIN ASPART 100 UNIT/ML IJ SOLN
10.0000 [IU] | Freq: Once | INTRAMUSCULAR | Status: AC
  Administered 2021-10-27: 10 [IU] via SUBCUTANEOUS

## 2021-10-27 MED ORDER — FENTANYL CITRATE PF 50 MCG/ML IJ SOSY: 25.0000 ug | PREFILLED_SYRINGE | INTRAMUSCULAR | Status: DC | PRN

## 2021-10-27 MED ORDER — MENTHOL 3 MG MT LOZG
1.0000 | LOZENGE | OROMUCOSAL | Status: DC | PRN
  Administered 2021-10-27: 3 mg via ORAL
  Filled 2021-10-27: qty 9

## 2021-10-27 MED ORDER — MIDAZOLAM HCL 2 MG/2ML IJ SOLN
INTRAMUSCULAR | Status: AC
  Filled 2021-10-27: qty 2

## 2021-10-27 MED ORDER — FENTANYL CITRATE (PF) 100 MCG/2ML IJ SOLN
INTRAMUSCULAR | Status: AC
  Filled 2021-10-27: qty 2

## 2021-10-27 MED ORDER — INSULIN GLARGINE-YFGN 100 UNIT/ML ~~LOC~~ SOLN
70.0000 [IU] | Freq: Every day | SUBCUTANEOUS | Status: DC
  Administered 2021-10-28 – 2021-10-31 (×4): 70 [IU] via SUBCUTANEOUS
  Filled 2021-10-27 (×4): qty 0.7

## 2021-10-27 MED ORDER — PROMETHAZINE HCL 25 MG/ML IJ SOLN: 6.2500 mg | INTRAMUSCULAR | Status: DC | PRN

## 2021-10-27 MED ORDER — CHLORHEXIDINE GLUCONATE 4 % EX LIQD: 60.0000 mL | Freq: Once | CUTANEOUS | Status: DC

## 2021-10-27 MED ORDER — VANCOMYCIN HCL 1000 MG IV SOLR
INTRAVENOUS | Status: DC | PRN
  Administered 2021-10-27: 1000 mg via TOPICAL

## 2021-10-27 MED ORDER — OXYCODONE HCL 5 MG/5ML PO SOLN: 5.0000 mg | Freq: Once | ORAL | Status: DC | PRN

## 2021-10-27 MED ORDER — PROPOFOL 10 MG/ML IV BOLUS
INTRAVENOUS | Status: AC
  Filled 2021-10-27: qty 20

## 2021-10-27 MED ORDER — ONDANSETRON HCL 4 MG/2ML IJ SOLN
INTRAMUSCULAR | Status: DC | PRN
  Administered 2021-10-27: 4 mg via INTRAVENOUS

## 2021-10-27 MED ORDER — POVIDONE-IODINE 10 % EX SWAB: 2.0000 | Freq: Once | CUTANEOUS | Status: DC

## 2021-10-27 MED ORDER — 0.9 % SODIUM CHLORIDE (POUR BTL) OPTIME
TOPICAL | Status: DC | PRN
  Administered 2021-10-27: 1000 mL

## 2021-10-27 MED ORDER — PHENYLEPHRINE 80 MCG/ML (10ML) SYRINGE FOR IV PUSH (FOR BLOOD PRESSURE SUPPORT)
PREFILLED_SYRINGE | INTRAVENOUS | Status: DC | PRN
  Administered 2021-10-27 (×4): 160 ug via INTRAVENOUS

## 2021-10-27 MED ORDER — PHENOL 1.4 % MT LIQD: 1.0000 | OROMUCOSAL | Status: DC | PRN

## 2021-10-27 MED ORDER — GLUCERNA SHAKE PO LIQD
237.0000 mL | Freq: Two times a day (BID) | ORAL | Status: DC
  Administered 2021-10-27 – 2021-10-29 (×4): 237 mL via ORAL
  Filled 2021-10-27 (×5): qty 237

## 2021-10-27 MED ORDER — LACTATED RINGERS IV SOLN: INTRAVENOUS | Status: DC | PRN

## 2021-10-27 MED ORDER — ACETAMINOPHEN 500 MG PO TABS
ORAL_TABLET | ORAL | Status: AC
  Filled 2021-10-27: qty 2

## 2021-10-27 MED ORDER — MIDAZOLAM HCL 5 MG/5ML IJ SOLN
INTRAMUSCULAR | Status: DC | PRN
  Administered 2021-10-27: 2 mg via INTRAVENOUS

## 2021-10-27 MED ORDER — DEXAMETHASONE SODIUM PHOSPHATE 10 MG/ML IJ SOLN
INTRAMUSCULAR | Status: DC | PRN
  Administered 2021-10-27: 10 mg via INTRAVENOUS

## 2021-10-27 MED ORDER — VANCOMYCIN HCL 1000 MG IV SOLR
INTRAVENOUS | Status: AC
  Filled 2021-10-27: qty 20

## 2021-10-27 MED ORDER — FENTANYL CITRATE (PF) 100 MCG/2ML IJ SOLN
INTRAMUSCULAR | Status: DC | PRN
  Administered 2021-10-27: 100 ug via INTRAVENOUS

## 2021-10-27 MED ORDER — PROPOFOL 10 MG/ML IV BOLUS
INTRAVENOUS | Status: DC | PRN
  Administered 2021-10-27: 200 mg via INTRAVENOUS

## 2021-10-27 MED ORDER — OXYCODONE HCL 5 MG PO TABS: 5.0000 mg | ORAL_TABLET | Freq: Once | ORAL | Status: DC | PRN

## 2021-10-27 MED ORDER — LIDOCAINE 2% (20 MG/ML) 5 ML SYRINGE
INTRAMUSCULAR | Status: DC | PRN
  Administered 2021-10-27: 8 mg via INTRAVENOUS

## 2021-10-27 MED ORDER — SODIUM CHLORIDE 0.9 % IR SOLN
Status: DC | PRN
  Administered 2021-10-27: 3000 mL

## 2021-10-27 SURGICAL SUPPLY — 43 items
BAG COUNTER SPONGE SURGICOUNT (BAG) ×1 IMPLANT
BAG ZIPLOCK 12X15 (MISCELLANEOUS) ×1 IMPLANT
BLADE SAW SGTL 11.0X1.19X90.0M (BLADE) IMPLANT
BLADE SURG 15 STRL LF DISP TIS (BLADE) ×2 IMPLANT
BLADE SURG 15 STRL SS (BLADE) ×2
BNDG ELASTIC 6X5.8 VLCR STR LF (GAUZE/BANDAGES/DRESSINGS) ×1 IMPLANT
BNDG GAUZE DERMACEA FLUFF 4 (GAUZE/BANDAGES/DRESSINGS) IMPLANT
CHLORAPREP W/TINT 26 (MISCELLANEOUS) ×2 IMPLANT
COVER SURGICAL LIGHT HANDLE (MISCELLANEOUS) ×1 IMPLANT
CUFF TOURN SGL QUICK 34 (TOURNIQUET CUFF) ×1
CUFF TRNQT CYL 34X4.125X (TOURNIQUET CUFF) ×1 IMPLANT
DERMABOND ADVANCED .7 DNX12 (GAUZE/BANDAGES/DRESSINGS) ×1 IMPLANT
DRAPE INCISE IOBAN 66X45 STRL (DRAPES) ×3 IMPLANT
DRAPE SHEET LG 3/4 BI-LAMINATE (DRAPES) ×1 IMPLANT
DRSG ADAPTIC 3X8 NADH LF (GAUZE/BANDAGES/DRESSINGS) IMPLANT
DRSG AQUACEL AG ADV 3.5X10 (GAUZE/BANDAGES/DRESSINGS) ×1 IMPLANT
DRSG TEGADERM 4X4.75 (GAUZE/BANDAGES/DRESSINGS) IMPLANT
EVACUATOR 1/8 PVC DRAIN (DRAIN) IMPLANT
GAUZE PACKING IODOFORM 1/4X15 (PACKING) IMPLANT
GAUZE PAD ABD 8X10 STRL (GAUZE/BANDAGES/DRESSINGS) IMPLANT
GAUZE SPONGE 2X2 8PLY STRL LF (GAUZE/BANDAGES/DRESSINGS) IMPLANT
GAUZE SPONGE 4X4 12PLY STRL (GAUZE/BANDAGES/DRESSINGS) IMPLANT
GLOVE BIO SURGEON STRL SZ7.5 (GLOVE) ×1 IMPLANT
GLOVE BIOGEL PI IND STRL 7.5 (GLOVE) ×1 IMPLANT
GOWN STRL REUS W/ TWL LRG LVL3 (GOWN DISPOSABLE) ×1 IMPLANT
GOWN STRL REUS W/TWL LRG LVL3 (GOWN DISPOSABLE) ×1
HANDPIECE INTERPULSE COAX TIP (DISPOSABLE) ×1
MANIFOLD NEPTUNE II (INSTRUMENTS) ×1 IMPLANT
PACK TOTAL KNEE CUSTOM (KITS) ×1 IMPLANT
PADDING CAST COTTON 6X4 STRL (CAST SUPPLIES) ×1 IMPLANT
PROTECTOR NERVE ULNAR (MISCELLANEOUS) ×1 IMPLANT
SET HNDPC FAN SPRY TIP SCT (DISPOSABLE) ×1 IMPLANT
SET PAD KNEE POSITIONER (MISCELLANEOUS) ×1 IMPLANT
SOLUTION IRRIG SURGIPHOR (IV SOLUTION) ×1 IMPLANT
STAPLER VISISTAT 35W (STAPLE) IMPLANT
SUT MNCRL AB 4-0 PS2 18 (SUTURE) ×1 IMPLANT
SUT STRATAFIX PDS+ 0 24IN (SUTURE) ×1 IMPLANT
SUT VIC AB 1 CT1 36 (SUTURE) ×2 IMPLANT
SUT VIC AB 2-0 CT1 27 (SUTURE) ×3
SUT VIC AB 2-0 CT1 TAPERPNT 27 (SUTURE) ×3 IMPLANT
SWAB COLLECTION DEVICE MRSA (MISCELLANEOUS) IMPLANT
SWAB CULTURE ESWAB REG 1ML (MISCELLANEOUS) IMPLANT
SYR BULB EAR ULCER 3OZ GRN STR (SYRINGE) IMPLANT

## 2021-10-27 NOTE — Transfer of Care (Signed)
Immediate Anesthesia Transfer of Care Note  Patient: [name]  Procedure(s) Performed: IRRIGATION AND DEBRIDEMENT WOUND Foot (Right: Foot)  Patient Location: PACU  Anesthesia Type:General  Level of Consciousness: sedated, patient cooperative and responds to stimulation  Airway & Oxygen Therapy: Patient Spontanous Breathing and Patient connected to face mask oxygen  Post-op Assessment: Report given to RN and Post -op Vital signs reviewed and stable  Post vital signs: Reviewed and stable  Last Vitals:  Vitals Value Taken Time  BP    Temp    Pulse    Resp 17 10/27/21 1354  SpO2    Vitals shown include unvalidated device data.  Last Pain:  Vitals:   10/27/21 1158  TempSrc: Oral  PainSc:       Patients Stated Pain Goal: 1 (61/22/44 9753)  Complications: No notable events documented.

## 2021-10-27 NOTE — Brief Op Note (Signed)
10/27/2021  1:44 PM  PATIENT:  [name]  41 y.o. female  PRE-OPERATIVE DIAGNOSIS:  right foot osteomyelitis  POST-OPERATIVE DIAGNOSIS:  right foot osteomyelitis  PROCEDURE:  Procedure(s): IRRIGATION AND DEBRIDEMENT WOUND Foot (Right)  SURGEON:  Surgeon(s) and Role:    * Armond Hang, MD - Primary  PHYSICIAN ASSISTANT: None  ASSISTANTS: none   ANESTHESIA:   general  EBL:  5 ml   BLOOD ADMINISTERED:none  DRAINS: none   LOCAL MEDICATIONS USED:  NONE  SPECIMEN:  Source of Specimen:  right foot TMA stump  DISPOSITION OF SPECIMEN:   Micro - deep tissues #1 and #2; aerobic and anaerobic swabs #1 and #2 (right foot TMA stump)  COUNTS:  YES  TOURNIQUET:  None used  DICTATION: .Note written in EPIC  PLAN OF CARE:  Return to RNF  PATIENT DISPOSITION:  PACU - hemodynamically stable.   Delay start of Pharmacological VTE agent (>24hrs) due to surgical blood loss or risk of bleeding: no

## 2021-10-27 NOTE — Plan of Care (Signed)
  Problem: Education: Goal: Knowledge of General Education information will improve Description Including pain rating scale, medication(s)/side effects and non-pharmacologic comfort measures Outcome: Progressing   

## 2021-10-27 NOTE — H&P (Signed)
H&P Update:  -History and Physical Reviewed  -Patient has been re-examined  -No change in the plan of care  -The risks and benefits were presented and reviewed. The risks due to new/persistent infection, stiffness, nerve/vessel/tendon injury, nonunion/malunion, wound healing issues, development of arthritis, failure of this surgery, possibility of delayed definitive surgery, need for further surgery, thromboembolic events, anesthesia/medical complications, amputation, death among others were discussed. The patient acknowledged the explanation, agreed to proceed with the plan and a consent was signed.  [name]  

## 2021-10-27 NOTE — Consult Note (Signed)
[name] Nurse Consult Note: Reason for Consult:Daily wound care and monitoring. Wound type:Surgical Pressure Injury POA: N/A  I have communicated with Dr. Kathaleen Bury via [facility] to let him know that the [facility] team is not a daily wound care service and that the Bedside RN would need to be provided with orders for daily wound care with his oversight.  [facility] nursing team will not follow, but will remain available to this patient, the nursing and medical teams.    Maudie Flakes, MSN, RN, CNS, [employer], Serita Grammes, [employer], [employer] phone:  [phone]

## 2021-10-27 NOTE — Op Note (Signed)
10/27/2021  10:56 PM   PATIENT: [name]  41 y.o. female  MRN: [id number]   PRE-OPERATIVE DIAGNOSIS:   Right foot abscess at site of prior transmetatarsal amputation stump   POST-OPERATIVE DIAGNOSIS:   Same   PROCEDURE: Right foot irrigation and debridement at site of prior transmetatarsal amputation stump   SURGEON:  Armond Hang, MD   ASSISTANT: None   ANESTHESIA: General, regional   EBL: Minimal   TOURNIQUET:  None utilized   COMPLICATIONS: None apparent   DISPOSITION: Extubated, awake and stable to recovery.   INDICATION FOR PROCEDURE: The patient presented with right foot abscess at site of prior TMA stump. On 12/18/20, the patient underwent right TMA for acutely infected diabetic foot ulcer. On 03/10/21, the patient underwent revision TMA with I&D for recurrent infection. She underwent several months of wound care and physical therapy with excellent improvement in her right foot. She also made great efforts to optimize her glycemic control. Unfortunately, she developed extensive DVTs requiring Eliquis therapy. She then noticed right foot pain at stump site over the past week. She underwent CT and MRI which confirmed new abscess at the stump site. She was admitted to the hospitalist team for antibiotics and further management.  We discussed the diagnosis, alternative treatment options, risks and benefits of the above surgical intervention, as well as alternative non-operative treatments. All questions/concerns were addressed and the patient/family demonstrated appropriate understanding of the diagnosis, the procedure, the postoperative course, and overall prognosis. The patient wished to proceed with surgical intervention and signed an informed surgical consent as such, in each others presence prior to surgery.   PROCEDURE IN DETAIL: After preoperative consent was obtained and the correct operative site was identified, the patient was brought to the  operating room supine on stretcher and transferred onto operating table. General anesthesia was induced. Preoperative antibiotics were administered. Surgical timeout was taken. The patient was then positioned supine with an ipsilateral hip bump. The operative lower extremity was prepped and draped in standard sterile fashion with a tourniquet around the thigh. The extremity was exsanguinated and the tourniquet was inflated to 300 mmHg.  We began by making an incision over the medial aspect of the prior TMA scar. Scant amount of frank purulence was readily encountered. This was evacuated and then dissection was carried out towards the hindfoot both medially and centrally as guided by the preoperative MRI findings. No further purulence was found.   The surgical site was thoroughly irrigated. The tourniquet was deflated and hemostasis achieved. Excellent healthy bleeding was noted from the surgical site. We then instilled povidone iodine in the wound as well as vancomycin powder.  Iodoform gauze was used to pack the wound.   The leg was cleaned with saline and sterile adaptic dressings with gauze were applied. A postop shoe was applied. The patient was awakened from anesthesia and transported to the recovery room in stable condition.    FOLLOW UP PLAN: -transfer to PACU, then return to RNF -strict touch down weightbearing operative extremity, maximum elevation -bedside nursing to do daily wound packing with sterile iodoform gauze -follow up with outpatient wound care -DVT ppx per primary team. Consider [place] during admission to monitor known DVT and potentially re-evaluate need for Eliquis continuation/discontinuation per primary team discretion -follow up as outpatient in 7-10 days for wound check   RADIOGRAPHS: None utilized   [facility]

## 2021-10-27 NOTE — Progress Notes (Signed)
Patient has been transported to PACU. Report given to [place], O.R., RN.

## 2021-10-27 NOTE — Anesthesia Procedure Notes (Signed)
Procedure Name: Intubation Date/Time: 10/27/2021 1:12 PM  Performed by: Gean Maidens, CRNAPre-anesthesia Checklist: Patient identified, Emergency Drugs available, Suction available, Patient being monitored and Timeout performed Patient Re-evaluated:Patient Re-evaluated prior to induction Oxygen Delivery Method: Circle system utilized Preoxygenation: Pre-oxygenation with 100% oxygen Induction Type: IV induction Ventilation: Mask ventilation without difficulty Laryngoscope Size: Mac and 4 Grade View: Grade I Tube type: Oral Tube size: 7.0 mm Number of attempts: 1 Airway Equipment and Method: Stylet Placement Confirmation: ETT inserted through vocal cords under direct vision, positive ETCO2 and breath sounds checked- equal and bilateral Secured at: 22 cm Tube secured with: Tape Dental Injury: Teeth and Oropharynx as per pre-operative assessment

## 2021-10-27 NOTE — Progress Notes (Signed)
PROGRESS NOTE    [name]  [id number] DOB: 11-Oct-1980 DOA: 10/25/2021 PCP: Grayce Sessions, NP    Brief Narrative:  41 y/o female with history of IDDM, HTN, HLD, Obesity class 3, prior right transmetatarsal amputation, admitted with right foot infection and possible abscess. Orthopedics consulted. She is on IV antibiotics.    Assessment & Plan:   Principal Problem:   Diabetic foot infection (HCC) Active Problems:   Type 2 diabetes mellitus with hyperglycemia (HCC)   Class 3 obesity (HCC)   Hyperlipidemia   HTN (hypertension)   Deep vein thrombosis (DVT) of right lower extremity (HCC)   Diabetic right foot infection -pre existing right foot TMA -CT/MRI show possible abscess in midfoot, no clear osteomyelitis -she is on IV antibiotics -orthopedics following -surgical intervention on 10/14 -will follow up on intraoperative cultures  Insulin dependent DM, uncontrolled with hyperglycemia -patient takes metformin, lantus and trulicity prior to admission -admitted with hyperglycemia -continue on lantus and SSI -counseled on importance of dietary compliance  HTN -continue lisinopril  HLD -continue statin  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, on hold for surgery -resume once cleared by orthopedics  Obesity class 3 -BMI 46.07 -discussed with patient importance of physical activity and healthy eating habits -discussed that she should consider bariatric surgery evaluation due to her elevated BMI and difficult to control DM, not only for weight loss but also for metabolic improvements     DVT prophylaxis: heparin injection 5,000 Units Start: 10/26/21 2200 Place and maintain sequential compression device Start: 10/26/21 1858  Code Status: full code Family Communication: discussed with patient Disposition Plan: Status is: Inpatient Remains inpatient appropriate because: needs ortho management, surgical intervention     Consultants:   orthopedics  Procedures:    Antimicrobials:  Vancomycin 10/13> Cefepime 10/13> Flagyl 10/13>    Subjective: She is hungry/thirsty. She does not have any new complaints at this time  Objective: Vitals:   10/26/21 1348 10/26/21 2203 10/27/21 0337 10/27/21 0757  BP: 134/65 130/74 133/81 132/75  Pulse: 71 89 77 78  Resp: 16 18 16    Temp: 98.6 F (37 C) 98 F (36.7 C) 98 F (36.7 C)   TempSrc:  Oral Oral   SpO2: 99% 96% 99%   Weight:      Height:        Intake/Output Summary (Last 24 hours) at 10/27/2021 1142 Last data filed at 10/27/2021 0620 Gross per 24 hour  Intake 1422.98 ml  Output --  Net 1422.98 ml   Filed Weights   10/25/21 2339  Weight: (!) 141.5 kg    Examination:  General exam: Appears calm and comfortable  Respiratory system: Clear to auscultation. Respiratory effort normal. Cardiovascular system: S1 & S2 heard, RRR. No JVD, murmurs, rubs, gallops or clicks. No pedal edema. Gastrointestinal system: Abdomen is nondistended, soft and nontender. No organomegaly or masses felt. Normal bowel sounds heard. Central nervous system: Alert and oriented. No focal neurological deficits. Extremities: right foot TMA, currently foot is in dressing Skin: No rashes, lesions or ulcers Psychiatry: Judgement and insight appear normal. Mood & affect appropriate.     Data Reviewed: I have personally reviewed following labs and imaging studies  CBC: Recent Labs  Lab 10/26/21 0041  WBC 9.4  NEUTROABS 5.9  HGB 12.1  HCT 37.4  MCV 83.9  PLT 247   Basic Metabolic Panel: Recent Labs  Lab 10/26/21 0041  NA 134*  K 4.0  CL 101  CO2 22  GLUCOSE 411*  BUN 12  CREATININE 0.80  CALCIUM 8.8*   GFR: Estimated Creatinine Clearance: 142.1 mL/min (by C-G formula based on SCr of 0.8 mg/dL). Liver Function Tests: Recent Labs  Lab 10/26/21 0041  AST 17  ALT 7  ALKPHOS 111  BILITOT 0.4  PROT 7.0  ALBUMIN 3.2*   No results for input(s): "LIPASE", "AMYLASE"  in the last 168 hours. No results for input(s): "AMMONIA" in the last 168 hours. Coagulation Profile: No results for input(s): "INR", "PROTIME" in the last 168 hours. Cardiac Enzymes: No results for input(s): "CKTOTAL", "CKMB", "CKMBINDEX", "TROPONINI" in the last 168 hours. BNP (last 3 results) No results for input(s): "PROBNP" in the last 8760 hours. HbA1C: No results for input(s): "HGBA1C" in the last 72 hours. CBG: Recent Labs  Lab 10/26/21 1544 10/26/21 2039 10/27/21 0004 10/27/21 0332 10/27/21 0722  GLUCAP 148* 308* 285* 262* 285*   Lipid Profile: No results for input(s): "CHOL", "HDL", "LDLCALC", "TRIG", "CHOLHDL", "LDLDIRECT" in the last 72 hours. Thyroid Function Tests: No results for input(s): "TSH", "T4TOTAL", "FREET4", "T3FREE", "THYROIDAB" in the last 72 hours. Anemia Panel: No results for input(s): "VITAMINB12", "FOLATE", "FERRITIN", "TIBC", "IRON", "RETICCTPCT" in the last 72 hours. Sepsis Labs: No results for input(s): "PROCALCITON", "LATICACIDVEN" in the last 168 hours.  Recent Results (from the past 240 hour(s))  Blood culture (routine x 2)     Status: None (Preliminary result)   Collection Time: 10/26/21 12:41 AM   Specimen: BLOOD  Result Value Ref Range Status   Specimen Description   Final    BLOOD SITE NOT SPECIFIED Performed at [facility] [address], [place], [place] [address]    Special Requests   Final    BOTTLES DRAWN AEROBIC AND ANAEROBIC Blood Culture adequate volume Performed at [facility] [address] [place], [place] [address]    Culture   Final    NO GROWTH 1 DAY Performed at [facility], [place] [address], [place], [place] [address]    Report Status PENDING  Incomplete  Blood culture (routine x 2)     Status: None (Preliminary result)   Collection Time: 10/26/21 12:53 AM   Specimen: BLOOD  Result Value Ref Range Status   Specimen Description   Final    BLOOD SITE NOT  SPECIFIED Performed at [facility] [address], [place], [place] [address]    Special Requests   Final    BOTTLES DRAWN AEROBIC AND ANAEROBIC Blood Culture adequate volume Performed at [facility] [address] [place], [place] [address]    Culture   Final    NO GROWTH 1 DAY Performed at [facility], [place] [address] [place], [place] [address]    Report Status PENDING  Incomplete  Surgical pcr screen     Status: None   Collection Time: 10/27/21  8:20 AM   Specimen: Nasal Mucosa; Nasal Swab  Result Value Ref Range Status   MRSA, PCR NEGATIVE NEGATIVE Final   Staphylococcus aureus NEGATIVE NEGATIVE Final    Comment: (NOTE) The Xpert SA Assay (FDA approved for NASAL specimens in patients 23 years of age and older), is one component of a comprehensive surveillance program. It is not intended to diagnose infection nor to guide or monitor treatment. Performed at [facility], [place] [address], [place], [place] [address]          Radiology Studies: MR FOOT RIGHT W WO CONTRAST  Result Date: 10/26/2021  CLINICAL DATA:  Medial foot wound post transmetatarsal amputation in 2022. EXAM: MRI OF THE RIGHT FOREFOOT WITHOUT AND WITH CONTRAST TECHNIQUE: Multiplanar, multisequence MR imaging of the right foot was performed before and after the administration of intravenous contrast. CONTRAST:  55mL GADAVIST GADOBUTROL 1 MMOL/ML IV SOLN COMPARISON:  CT 10/22/2021, radiographs 03/08/2021 and MRI 02/27/2021. FINDINGS: Bones/Joint/Cartilage Status post amputation through the metatarsal bases. Previously demonstrated bone marrow edema within the metatarsal stumps and medial cuneiform has nearly resolved. No progressive cortical destruction or suspicious marrow enhancement identified. The additional bones of the hindfoot and midfoot appear unremarkable. No significant joint effusions or abnormal synovial enhancement. Ligaments Intact  Lisfranc ligament. Muscles and Tendons Mild T2 hyperintensity and enhancement within the plantar musculature, improved from the previous study. No significant tenosynovitis. Soft tissues As seen on recent CT, there is a soft tissue wound along the plantar medial aspect of the midfoot with associated skin thickening and surrounding soft tissue enhancement. Small rim enhancing fluid collection measuring up to 1.4 cm in diameter is unchanged from the recent CT and communicates with the wound. No new or enlarging fluid collections are identified. No evidence of foreign body. IMPRESSION: 1. Persistent soft tissue wound along the plantar medial aspect of the midfoot with a small rim enhancing fluid collection which communicates with the wound. Findings are unchanged from recent CT. 2. No evidence of osteomyelitis or septic joint. 3. Interval improvement in previously demonstrated bone marrow edema within the metatarsal stumps and medial cuneiform status post amputation through the metatarsal bases. Electronically Signed   By: Carey Bullocks M.D.   On: 10/26/2021 08:25        Scheduled Meds:  atorvastatin  80 mg Oral Daily   Chlorhexidine Gluconate Cloth  6 each Topical Daily   gabapentin  600 mg Oral TID   heparin injection (subcutaneous)  5,000 Units Subcutaneous Q8H   insulin aspart  0-9 Units Subcutaneous Q4H   insulin glargine-yfgn  35 Units Subcutaneous Daily   lisinopril  2.5 mg Oral Daily   Continuous Infusions:  sodium chloride 10 mL/hr at 10/27/21 0620   ceFEPime (MAXIPIME) IV Stopped (10/27/21 0317)   metronidazole 500 mg (10/27/21 1100)   vancomycin 150 mL/hr at 10/27/21 0205     LOS: 1 day    Time spent:    Erick Blinks, MD [facility]   If 7PM-7AM, please contact night-coverage [email or web address]  10/27/2021, 11:42 AM

## 2021-10-27 NOTE — Plan of Care (Signed)
  Problem: Coping: Goal: Ability to adjust to condition or change in health will improve Outcome: Progressing   Problem: Pain Managment: Goal: General experience of comfort will improve Outcome: Progressing   

## 2021-10-27 NOTE — Anesthesia Postprocedure Evaluation (Signed)
Anesthesia Post Note  Patient: HENRETTA QUIST  Procedure(s) Performed: IRRIGATION AND DEBRIDEMENT WOUND Foot (Right: Foot)     Patient location during evaluation: PACU Anesthesia Type: General Level of consciousness: awake and alert Pain management: pain level controlled Vital Signs Assessment: post-procedure vital signs reviewed and stable Respiratory status: spontaneous breathing, nonlabored ventilation and respiratory function stable Cardiovascular status: blood pressure returned to baseline and stable Postop Assessment: no apparent nausea or vomiting Anesthetic complications: no   No notable events documented.  Last Vitals:  Vitals:   10/27/21 1759 10/27/21 2055  BP: 115/81 132/68  Pulse: 97 95  Resp: 20 18  Temp: 36.9 C 37 C  SpO2: 100% 98%    Last Pain:  Vitals:   10/27/21 2055  TempSrc: Oral  PainSc:                  Audry Pili

## 2021-10-28 ENCOUNTER — Encounter (HOSPITAL_COMMUNITY): Payer: Self-pay | Admitting: Orthopaedic Surgery

## 2021-10-28 DIAGNOSIS — E11628 Type 2 diabetes mellitus with other skin complications: Secondary | ICD-10-CM | POA: Diagnosis not present

## 2021-10-28 DIAGNOSIS — R739 Hyperglycemia, unspecified: Secondary | ICD-10-CM | POA: Diagnosis not present

## 2021-10-28 DIAGNOSIS — E1165 Type 2 diabetes mellitus with hyperglycemia: Secondary | ICD-10-CM | POA: Diagnosis not present

## 2021-10-28 DIAGNOSIS — L089 Local infection of the skin and subcutaneous tissue, unspecified: Secondary | ICD-10-CM | POA: Diagnosis not present

## 2021-10-28 LAB — CBC
HCT: 38.8 % (ref 36.0–46.0)
Hemoglobin: 12.3 g/dL (ref 12.0–15.0)
MCH: 26.2 pg (ref 26.0–34.0)
MCHC: 31.7 g/dL (ref 30.0–36.0)
MCV: 82.7 fL (ref 80.0–100.0)
Platelets: 281 10*3/uL (ref 150–400)
RBC: 4.69 MIL/uL (ref 3.87–5.11)
RDW: 15 % (ref 11.5–15.5)
WBC: 10.8 10*3/uL — ABNORMAL HIGH (ref 4.0–10.5)
nRBC: 0 % (ref 0.0–0.2)

## 2021-10-28 LAB — GLUCOSE, CAPILLARY
Glucose-Capillary: 273 mg/dL — ABNORMAL HIGH (ref 70–99)
Glucose-Capillary: 312 mg/dL — ABNORMAL HIGH (ref 70–99)
Glucose-Capillary: 315 mg/dL — ABNORMAL HIGH (ref 70–99)
Glucose-Capillary: 317 mg/dL — ABNORMAL HIGH (ref 70–99)
Glucose-Capillary: 342 mg/dL — ABNORMAL HIGH (ref 70–99)
Glucose-Capillary: 392 mg/dL — ABNORMAL HIGH (ref 70–99)

## 2021-10-28 LAB — BASIC METABOLIC PANEL
Anion gap: 10 (ref 5–15)
BUN: 19 mg/dL (ref 6–20)
CO2: 21 mmol/L — ABNORMAL LOW (ref 22–32)
Calcium: 8.9 mg/dL (ref 8.9–10.3)
Chloride: 101 mmol/L (ref 98–111)
Creatinine, Ser: 0.88 mg/dL (ref 0.44–1.00)
GFR, Estimated: >60 mL/min (ref >60)
Glucose, Bld: 336 mg/dL — ABNORMAL HIGH (ref 70–99)
Potassium: 4.3 mmol/L (ref 3.5–5.1)
Sodium: 132 mmol/L — ABNORMAL LOW (ref 135–145)

## 2021-10-28 MED ORDER — INSULIN ASPART 100 UNIT/ML IJ SOLN
0.0000 [IU] | Freq: Every day | INTRAMUSCULAR | Status: DC
  Administered 2021-10-28: 4 [IU] via SUBCUTANEOUS
  Administered 2021-10-29: 2 [IU] via SUBCUTANEOUS

## 2021-10-28 MED ORDER — INSULIN ASPART 100 UNIT/ML IJ SOLN
0.0000 [IU] | Freq: Three times a day (TID) | INTRAMUSCULAR | Status: DC
  Administered 2021-10-28: 15 [IU] via SUBCUTANEOUS
  Administered 2021-10-29: 4 [IU] via SUBCUTANEOUS
  Administered 2021-10-29: 11 [IU] via SUBCUTANEOUS
  Administered 2021-10-30: 3 [IU] via SUBCUTANEOUS
  Administered 2021-10-30: 4 [IU] via SUBCUTANEOUS
  Administered 2021-10-30 – 2021-10-31 (×4): 3 [IU] via SUBCUTANEOUS

## 2021-10-28 MED ORDER — APIXABAN 5 MG PO TABS
5.0000 mg | ORAL_TABLET | Freq: Two times a day (BID) | ORAL | Status: DC
  Administered 2021-10-28 – 2021-10-31 (×7): 5 mg via ORAL
  Filled 2021-10-28 (×7): qty 1

## 2021-10-28 MED ORDER — METFORMIN HCL 500 MG PO TABS
1000.0000 mg | ORAL_TABLET | Freq: Two times a day (BID) | ORAL | Status: DC
  Administered 2021-10-28 – 2021-10-31 (×7): 1000 mg via ORAL
  Filled 2021-10-28 (×8): qty 2

## 2021-10-28 NOTE — Progress Notes (Signed)
PROGRESS NOTE    [name]  BVQ:[id number] DOB: Feb 03, 1980 DOA: 10/25/2021 PCP: Grayce Sessions, NP    Brief Narrative:  41 y/o female with history of IDDM, HTN, HLD, Obesity class 3, prior right transmetatarsal amputation, admitted with right foot infection and possible abscess. Orthopedics consulted. She is on IV antibiotics.    Assessment & Plan:   Principal Problem:   Diabetic foot infection (HCC) Active Problems:   Type 2 diabetes mellitus with hyperglycemia (HCC)   Class 3 obesity (HCC)   Hyperlipidemia   HTN (hypertension)   Deep vein thrombosis (DVT) of right lower extremity (HCC)   Diabetic right foot infection -pre existing right foot TMA -CT/MRI show possible abscess in midfoot, no clear osteomyelitis -she is on IV antibiotics -orthopedics following -Status post incision and drainage 10/14 -Intraoperative cultures currently growing gram-positive cocci -Discontinue Flagyl, cefepime, will continue with vancomycin for now  Insulin dependent DM, uncontrolled with hyperglycemia -patient takes metformin, lantus and trulicity prior to admission -admitted with hyperglycemia -continue on lantus and SSI -counseled on importance of dietary compliance -Check A1c -Dietitian consult for dietary education -Will discuss with patient starting SGLT 2 inhibitor  HTN -continue lisinopril  HLD -continue statin  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 -Initial DVT diagnosed 01/2021 after undergoing her first foot surgery in December -Follow-up ultrasound from 02/2021 showed that right lower extremity DVT had resolved -Discussed with patient the possibility of discontinuing further anticoagulation -She has requested repeat Dopplers to ensure that there has been no recurrence of clot which I think is reasonable -Even if she no longer has DVT, will need to consider risk/benefits of discontinuing anticoagulation at this time, immediately after his surgery where she is  not mobile and may be more sedentary, increasing her risk of recurrent DVT  Obesity class 3 -BMI 46.07 -discussed with patient importance of physical activity and healthy eating habits -discussed that she should consider bariatric surgery evaluation due to her elevated BMI and difficult to control DM, not only for weight loss but also for metabolic improvements     DVT prophylaxis: Place and maintain sequential compression device Start: 10/26/21 1858 apixaban (ELIQUIS) tablet 5 mg  Code Status: full code Family Communication: discussed with patient Disposition Plan: Status is: Inpatient Remains inpatient appropriate because: needs ortho management, surgical intervention     Consultants:  orthopedics  Procedures:    Antimicrobials:  Vancomycin 10/13> Cefepime 10/13> 10/15 Flagyl 10/13> 10/15   Subjective: She does have pain in her right foot, but overall seems to be reasonably controlled  Objective: Vitals:   10/27/21 2055 10/28/21 0404 10/28/21 0939 10/28/21 1336  BP: 132/68 (!) 128/108 125/74 138/69  Pulse: 95 91 81 89  Resp: 18 18 16 18   Temp: 98.6 F (37 C) 97.9 F (36.6 C) 98.1 F (36.7 C) 98.5 F (36.9 C)  TempSrc: Oral Oral Oral Oral  SpO2: 98% 99% 100% 100%  Weight:      Height:        Intake/Output Summary (Last 24 hours) at 10/28/2021 1759 Last data filed at 10/28/2021 1437 Gross per 24 hour  Intake 1931.74 ml  Output 5150 ml  Net -3218.26 ml   Filed Weights   10/25/21 2339  Weight: (!) 141.5 kg    Examination:  General exam: Appears calm and comfortable  Respiratory system: Clear to auscultation. Respiratory effort normal. Cardiovascular system: S1 & S2 heard, RRR. No JVD, murmurs, rubs, gallops or clicks. No pedal edema. Gastrointestinal system: Abdomen is nondistended, soft  and nontender. No organomegaly or masses felt. Normal bowel sounds heard. Central nervous system: Alert and oriented. No focal neurological deficits. Extremities:  right foot TMA, currently foot is in dressing Skin: No rashes, lesions or ulcers Psychiatry: Judgement and insight appear normal. Mood & affect appropriate.     Data Reviewed: I have personally reviewed following labs and imaging studies  CBC: Recent Labs  Lab 10/26/21 0041 10/28/21 0332  WBC 9.4 10.8*  NEUTROABS 5.9  --   HGB 12.1 12.3  HCT 37.4 38.8  MCV 83.9 82.7  PLT 247 281   Basic Metabolic Panel: Recent Labs  Lab 10/26/21 0041 10/28/21 0332  NA 134* 132*  K 4.0 4.3  CL 101 101  CO2 22 21*  GLUCOSE 411* 336*  BUN 12 19  CREATININE 0.80 0.88  CALCIUM 8.8* 8.9   GFR: Estimated Creatinine Clearance: 129.2 mL/min (by C-G formula based on SCr of 0.88 mg/dL). Liver Function Tests: Recent Labs  Lab 10/26/21 0041  AST 17  ALT 7  ALKPHOS 111  BILITOT 0.4  PROT 7.0  ALBUMIN 3.2*   No results for input(s): "LIPASE", "AMYLASE" in the last 168 hours. No results for input(s): "AMMONIA" in the last 168 hours. Coagulation Profile: No results for input(s): "INR", "PROTIME" in the last 168 hours. Cardiac Enzymes: No results for input(s): "CKTOTAL", "CKMB", "CKMBINDEX", "TROPONINI" in the last 168 hours. BNP (last 3 results) No results for input(s): "PROBNP" in the last 8760 hours. HbA1C: No results for input(s): "HGBA1C" in the last 72 hours. CBG: Recent Labs  Lab 10/28/21 0037 10/28/21 0401 10/28/21 0907 10/28/21 1249 10/28/21 1659  GLUCAP 392* 315* 273* 317* 342*   Lipid Profile: No results for input(s): "CHOL", "HDL", "LDLCALC", "TRIG", "CHOLHDL", "LDLDIRECT" in the last 72 hours. Thyroid Function Tests: No results for input(s): "TSH", "T4TOTAL", "FREET4", "T3FREE", "THYROIDAB" in the last 72 hours. Anemia Panel: No results for input(s): "VITAMINB12", "FOLATE", "FERRITIN", "TIBC", "IRON", "RETICCTPCT" in the last 72 hours. Sepsis Labs: No results for input(s): "PROCALCITON", "LATICACIDVEN" in the last 168 hours.  Recent Results (from the past 240  hour(s))  Blood culture (routine x 2)     Status: None (Preliminary result)   Collection Time: 10/26/21 12:41 AM   Specimen: BLOOD  Result Value Ref Range Status   Specimen Description   Final    BLOOD SITE NOT SPECIFIED Performed at [facility], [address], [place], [place] [address]    Special Requests   Final    BOTTLES DRAWN AEROBIC AND ANAEROBIC Blood Culture adequate volume Performed at [facility], [address], [place], [place] [address]    Culture   Final    NO GROWTH 1 DAY Performed at [facility], [address] [place], [place] [address]    Report Status PENDING  Incomplete  Blood culture (routine x 2)     Status: None (Preliminary result)   Collection Time: 10/26/21 12:53 AM   Specimen: BLOOD  Result Value Ref Range Status   Specimen Description   Final    BLOOD SITE NOT SPECIFIED Performed at [facility], [address], [place], [place] [address]    Special Requests   Final    BOTTLES DRAWN AEROBIC AND ANAEROBIC Blood Culture adequate volume Performed at [facility], [address] [place], [place] [address]    Culture   Final    NO GROWTH 1 DAY Performed at [facility], [address] [place], [place] [address]    Report Status PENDING  Incomplete  Surgical pcr screen     Status: None   Collection Time: 10/27/21  8:20 AM   Specimen: Nasal Mucosa; Nasal Swab  Result Value Ref Range Status   MRSA, PCR NEGATIVE NEGATIVE Final   Staphylococcus aureus NEGATIVE NEGATIVE Final    Comment: (NOTE) The Xpert SA Assay (FDA approved for NASAL specimens in patients 46 years of age and older), is one component of a comprehensive surveillance program. It is not intended to diagnose infection nor to guide or monitor treatment. Performed at [facility], [address], [place], [place] [address]   Aerobic/Anaerobic Culture w Gram Stain  (surgical/deep wound)     Status: None (Preliminary result)   Collection Time: 10/27/21  2:11 PM   Specimen: PATH Other; Tissue  Result Value Ref Range Status   Specimen Description TISSUE RIGHT FOOT  Final   Special Requests ID   D  Final   Gram Stain NO WBC SEEN RARE GRAM POSITIVE COCCI IN SINGLES   Final   Culture   Final    NO GROWTH < 24 HOURS Performed at [facility], [address] [place], [place] [address]    Report Status PENDING  Incomplete  Aerobic/Anaerobic Culture w Gram Stain (surgical/deep wound)     Status: None (Preliminary result)   Collection Time: 10/27/21  2:13 PM   Specimen: PATH Other; Tissue  Result Value Ref Range Status   Specimen Description ABSCESS RIGHT FOOT  Final   Special Requests ID A  Final   Gram Stain NO WBC SEEN RARE GRAM POSITIVE COCCI IN PAIRS   Final   Culture   Final    NO GROWTH < 24 HOURS Performed at [facility], [address], [place], [place] [address]    Report Status PENDING  Incomplete  Aerobic/Anaerobic Culture w Gram Stain (surgical/deep wound)     Status: None (Preliminary result)   Collection Time: 10/27/21  2:14 PM   Specimen: PATH Soft tissue  Result Value Ref Range Status   Specimen Description ABSCESS RIGHT FOOT  Final   Special Requests ID B  Final   Gram Stain   Final    NO WBC SEEN RARE GRAM POSITIVE COCCI IN SINGLES IN PAIRS    Culture   Final    NO GROWTH < 24 HOURS Performed at [facility], [address] [place], [place] [address]    Report Status PENDING  Incomplete  Aerobic/Anaerobic Culture w Gram Stain (surgical/deep wound)     Status: None (Preliminary result)   Collection Time: 10/27/21  2:15 PM   Specimen: PATH Soft tissue  Result Value Ref Range Status   Specimen Description TISSUE RIGHT FOOT  Final   Special Requests ID C  Final   Gram Stain NO ORGANISMS SEEN NO WBC SEEN   Final   Culture   Final    NO GROWTH < 24 HOURS Performed at [facility], [address] [place], [place] [address]    Report Status PENDING  Incomplete         Radiology Studies: No results found.      Scheduled Meds:  apixaban  5 mg Oral BID   atorvastatin  80 mg Oral Daily   Chlorhexidine Gluconate Cloth  6 each Topical Daily   feeding supplement (GLUCERNA SHAKE)  237 mL Oral BID BM   gabapentin  600 mg  Oral TID   insulin aspart  0-20 Units Subcutaneous TID WC   insulin aspart  0-5 Units Subcutaneous QHS   insulin glargine-yfgn  70 Units Subcutaneous Daily   lisinopril  2.5 mg Oral Daily   Continuous Infusions:  sodium chloride Stopped (10/27/21 1204)   vancomycin 1,500 mg (10/28/21 1342)     LOS: 2 days    Time spent: 68mins    Kathie Dike, MD [facility]   If 7PM-7AM, please contact night-coverage [email or web address]  10/28/2021, 5:59 PM

## 2021-10-28 NOTE — Progress Notes (Signed)
   Subjective: 1 Day Post-Op Procedure(s) (LRB): IRRIGATION AND DEBRIDEMENT WOUND Foot (Right)  Pt c/o mild to moderate soreness in the right foot Denies any new symptoms overnight Pain under adequate control Patient reports pain as moderate.  Objective:   VITALS:   Vitals:   10/27/21 2055 10/28/21 0404  BP: 132/68 (!) 128/108  Pulse: 95 91  Resp: 18 18  Temp: 98.6 F (37 C) 97.9 F (36.6 C)  SpO2: 98% 99%    Right foot dressing intact No signs of drainage from TMA stump Nv intact proximally   LABS Recent Labs    10/26/21 0041 10/28/21 0332  HGB 12.1 12.3  HCT 37.4 38.8  WBC 9.4 10.8*  PLT 247 281    Recent Labs    10/26/21 0041 10/28/21 0332  NA 134* 132*  K 4.0 4.3  BUN 12 19  CREATININE 0.80 0.88  GLUCOSE 411* 336*     Assessment/Plan: 1 Day Post-Op Procedure(s) (LRB): IRRIGATION AND DEBRIDEMENT WOUND Foot (Right) Continue IV antibiotics PT/OT for weight bearing on right heel Pain management     Brad Luna [place], MPAS [facility] is now [facility]  [facility] [address] [place], [place], [place] [address] Phone: [phone] [email or web address]  [employer]

## 2021-10-28 NOTE — Consult Note (Signed)
Patient ID: [name] MRN: [id number] DOB/AGE: 40-Nov-1982 40 y.o.  Admit date: 10/25/2021  Admission Diagnoses:  Principal Problem:   Diabetic foot infection (HCC) Active Problems:   Type 2 diabetes mellitus with hyperglycemia (HCC)   Class 3 obesity (HCC)   Hyperlipidemia   HTN (hypertension)   Deep vein thrombosis (DVT) of right lower extremity (HCC)   HPI: The patient presented with right foot abscess at site of prior TMA stump. On 12/18/20, the patient underwent right TMA for acutely infected diabetic foot ulcer. On 03/10/21, the patient underwent revision TMA with I&D for recurrent infection. She underwent several months of wound care and physical therapy with excellent improvement in her right foot. She also made great efforts to optimize her glycemic control. Unfortunately, she developed extensive DVTs requiring Eliquis therapy. She then noticed right foot pain at stump site over the past week. She underwent CT and MRI which confirmed new abscess at the stump site. She was admitted to the hospitalist team for antibiotics and further management.  Past Medical History: Past Medical History:  Diagnosis Date   Class 3 obesity (HCC) 12/16/2020   Diabetes mellitus     Surgical History: Past Surgical History:  Procedure Laterality Date   EXTREMITY CYST EXCISION     INCISION AND DRAINAGE OF WOUND Right 10/27/2021   Procedure: IRRIGATION AND DEBRIDEMENT WOUND Foot;  Surgeon: Netta Cedars, MD;  Location: [facility];  Service: Orthopedics;  Laterality: Right;   MINOR IRRIGATION AND DEBRIDEMENT OF WOUND Right 03/10/2021   Procedure: MINOR IRRIGATION AND DEBRIDEMENT OF WOUND;  Surgeon: Netta Cedars, MD;  Location: [facility];  Service: Orthopedics;  Laterality: Right;   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   Procedure: TRANSMETATARSAL AMPUTATION;  Surgeon: Netta Cedars, MD;  Location: [facility];  Service: Orthopedics;  Laterality: Right;    Family History: Family  History  Problem Relation Age of Onset   Hypertension Mother    Diabetes Mother    Diabetes Other     Social History: Social History   Socioeconomic History   Marital status: Single    Spouse name: Not on file   Number of children: Not on file   Years of education: Not on file   Highest education level: Not on file  Occupational History   Not on file  Tobacco Use   Smoking status: Former    Types: Cigarettes   Smokeless tobacco: Not on file  Vaping Use   Vaping Use: Never used  Substance and Sexual Activity   Alcohol use: Yes    Comment: occ   Drug use: Yes    Types: Marijuana    Comment: occasionally   Sexual activity: Not on file  Other Topics Concern   Not on file  Social History Narrative   Not on file   Social Determinants of Health   Financial Resource Strain: Not on file  Food Insecurity: Unknown (10/26/2021)   Hunger Vital Sign    Worried About Running Out of Food in the Last Year: Patient refused    Ran Out of Food in the Last Year: Patient refused  Transportation Needs: No Transportation Needs (10/26/2021)   PRAPARE - Administrator, Civil Service (Medical): No    Lack of Transportation (Non-Medical): No  Physical Activity: Not on file  Stress: Not on file  Social Connections: Not on file  Intimate Partner Violence: Not At Risk (10/26/2021)   Humiliation, Afraid, Rape, and Kick questionnaire    Fear of Current or  Ex-Partner: No    Emotionally Abused: No    Physically Abused: No    Sexually Abused: No    Allergies: Patient has no known allergies.  Medications: I have reviewed the patient's current medications.  Vital Signs: Patient Vitals for the past 24 hrs:  BP Temp Temp src Pulse Resp SpO2  10/28/21 1336 138/69 98.5 F (36.9 C) Oral 89 18 100 %  10/28/21 0939 125/74 98.1 F (36.7 C) Oral 81 16 100 %  10/28/21 0404 (!) 128/108 97.9 F (36.6 C) Oral 91 18 99 %  10/27/21 2055 132/68 98.6 F (37 C) Oral 95 18 98 %  10/27/21  1759 115/81 98.4 F (36.9 C) -- 97 20 100 %  10/27/21 1519 102/71 97.7 F (36.5 C) Oral 78 18 100 %  10/27/21 1445 (!) 116/93 98.4 F (36.9 C) -- 66 12 100 %  10/27/21 1430 111/76 -- -- 72 12 100 %  10/27/21 1417 118/84 -- -- 70 12 100 %    Radiology: MR FOOT RIGHT W WO CONTRAST  Result Date: 10/26/2021 CLINICAL DATA:  Medial foot wound post transmetatarsal amputation in 2022. EXAM: MRI OF THE RIGHT FOREFOOT WITHOUT AND WITH CONTRAST TECHNIQUE: Multiplanar, multisequence MR imaging of the right foot was performed before and after the administration of intravenous contrast. CONTRAST:  41mL GADAVIST GADOBUTROL 1 MMOL/ML IV SOLN COMPARISON:  CT 10/22/2021, radiographs 03/08/2021 and MRI 02/27/2021. FINDINGS: Bones/Joint/Cartilage Status post amputation through the metatarsal bases. Previously demonstrated bone marrow edema within the metatarsal stumps and medial cuneiform has nearly resolved. No progressive cortical destruction or suspicious marrow enhancement identified. The additional bones of the hindfoot and midfoot appear unremarkable. No significant joint effusions or abnormal synovial enhancement. Ligaments Intact Lisfranc ligament. Muscles and Tendons Mild T2 hyperintensity and enhancement within the plantar musculature, improved from the previous study. No significant tenosynovitis. Soft tissues As seen on recent CT, there is a soft tissue wound along the plantar medial aspect of the midfoot with associated skin thickening and surrounding soft tissue enhancement. Small rim enhancing fluid collection measuring up to 1.4 cm in diameter is unchanged from the recent CT and communicates with the wound. No new or enlarging fluid collections are identified. No evidence of foreign body. IMPRESSION: 1. Persistent soft tissue wound along the plantar medial aspect of the midfoot with a small rim enhancing fluid collection which communicates with the wound. Findings are unchanged from recent CT. 2. No  evidence of osteomyelitis or septic joint. 3. Interval improvement in previously demonstrated bone marrow edema within the metatarsal stumps and medial cuneiform status post amputation through the metatarsal bases. Electronically Signed   By: Richardean Sale M.D.   On: 10/26/2021 08:25   CT FOOT RIGHT W CONTRAST  Result Date: 10/25/2021 CLINICAL DATA:  Medial foot wound. History of transmetatarsal amputation in 2022 EXAM: CT OF THE LOWER RIGHT EXTREMITY WITH CONTRAST TECHNIQUE: Multidetector CT imaging of the lower right extremity was performed according to the standard protocol following intravenous contrast administration. RADIATION DOSE REDUCTION: This exam was performed according to the departmental dose-optimization program which includes automated exposure control, adjustment of the mA and/or kV according to patient size and/or use of iterative reconstruction technique. CONTRAST:  22mL OMNIPAQUE IOHEXOL 300 MG/ML  SOLN COMPARISON:  X-ray 03/08/2021.  MRI 02/27/2021 FINDINGS: Bones/Joint/Cartilage Postsurgical changes from prior transmetatarsal amputation of the first through fifth rays at the level of the metatarsal bases. Sclerosis and erosion is present at the resection margin of the residual fourth metatarsal base (series  6, image 55) sclerotic changes are also present at the fifth metatarsal base without definite erosion. The remaining residual metatarsal bases are intact without obvious osseous erosion. Bones are demineralized and diffusely heterogeneous, likely related to disuse demineralization. No acute fracture. No dislocation. Ligaments Suboptimally assessed by CT. Muscles and Tendons Post amputation changes of the musculotendinous structures. Diffuse muscle atrophy. Soft tissues Soft tissue wound/ulceration at the plantar-medial aspect of the distal stump. Marked skin thickening with extensive fluid and edema within the soft tissues. There is a small irregular rim enhancing fluid pocket  communicating to the overlying soft tissues via a enhancing sinus tract (series 7, images 124-132). Fluid pocket measures approximately 1.5 x 0.7 x 1.0 cm. No soft tissue gas. IMPRESSION: 1. Postsurgical changes from prior transmetatarsal amputation of the first through fifth rays at the level of the metatarsal bases. Erosion of the residual fourth metatarsal base suggestive of osteomyelitis, which may be acute on chronic. 2. Soft tissue wound/ulceration at the plantar-medial aspect of the distal stump with extensive surrounding cellulitis. There is a small irregular rim enhancing 1.5 cm fluid pocket communicating to the overlying soft tissues via an enhancing sinus tract, compatible with a small abscess. Electronically Signed   By: Duanne Guess D.O.   On: 10/25/2021 09:29    Labs: Recent Labs    10/26/21 0041 10/28/21 0332  WBC 9.4 10.8*  RBC 4.46 4.69  HCT 37.4 38.8  PLT 247 281   Recent Labs    10/26/21 0041 10/28/21 0332  NA 134* 132*  K 4.0 4.3  CL 101 101  CO2 22 21*  BUN 12 19  CREATININE 0.80 0.88  GLUCOSE 411* 336*  CALCIUM 8.8* 8.9   No results for input(s): "LABPT", "INR" in the last 72 hours.  Review of Systems: ROS as detailed in HPI  Physical Exam: Body mass index is 46.07 kg/m.  Physical Exam   Gen: AAOx3, NAD Comfortable at rest  Right Lower Extremity: Skin intact TTP over medial aspect of TMA stump HF/KE/KF/ADF/APF 5/5 SILT throughout DP, PT 2+ to palp CR < 2s   Assessment and Plan: Ortho consult for Right foot abscess at site of prior transmetatarsal amputation stump  -history, exam and imaging reviewed at length with patient/family -plan for right foot I&D of TMA stump on Sat 10/27/21 -PT/OT postop -VTE ppx and pain mmt per primary team -NPO from midnight and hold VTE ppx from midnight Sat 14/23  Netta Cedars, MD Orthopaedic Surgeon [facility] [phone]  The risks and benefits were presented and reviewed. The risks due to  new/persistent infection, stiffness, nerve/vessel/tendon injury, nonunion/malunion, wound healing issues, allograft usage, development of arthritis, failure of this surgery, possibility of delayed definitive surgery, need for further surgery, thromboembolic events, anesthesia/medical complications, amputation, death among others were discussed. The patient acknowledged the explanation, agreed to proceed with the plan and a consent was signed.

## 2021-10-28 NOTE — Plan of Care (Signed)
  Problem: Coping: Goal: Ability to adjust to condition or change in health will improve Outcome: Progressing   Problem: Pain Managment: Goal: General experience of comfort will improve Outcome: Progressing   

## 2021-10-28 NOTE — Plan of Care (Signed)
  Problem: Education: Goal: Knowledge of General Education information will improve Description Including pain rating scale, medication(s)/side effects and non-pharmacologic comfort measures Outcome: Progressing   

## 2021-10-29 ENCOUNTER — Inpatient Hospital Stay (HOSPITAL_COMMUNITY): Payer: Medicaid Other

## 2021-10-29 DIAGNOSIS — I82409 Acute embolism and thrombosis of unspecified deep veins of unspecified lower extremity: Secondary | ICD-10-CM

## 2021-10-29 DIAGNOSIS — E1165 Type 2 diabetes mellitus with hyperglycemia: Secondary | ICD-10-CM | POA: Diagnosis not present

## 2021-10-29 DIAGNOSIS — E11628 Type 2 diabetes mellitus with other skin complications: Secondary | ICD-10-CM | POA: Diagnosis not present

## 2021-10-29 DIAGNOSIS — R739 Hyperglycemia, unspecified: Secondary | ICD-10-CM | POA: Diagnosis not present

## 2021-10-29 DIAGNOSIS — L089 Local infection of the skin and subcutaneous tissue, unspecified: Secondary | ICD-10-CM | POA: Diagnosis not present

## 2021-10-29 LAB — BASIC METABOLIC PANEL
Anion gap: 9 (ref 5–15)
BUN: 25 mg/dL — ABNORMAL HIGH (ref 6–20)
CO2: 23 mmol/L (ref 22–32)
Calcium: 8.6 mg/dL — ABNORMAL LOW (ref 8.9–10.3)
Chloride: 103 mmol/L (ref 98–111)
Creatinine, Ser: 0.81 mg/dL (ref 0.44–1.00)
GFR, Estimated: >60 mL/min (ref >60)
Glucose, Bld: 343 mg/dL — ABNORMAL HIGH (ref 70–99)
Potassium: 4.2 mmol/L (ref 3.5–5.1)
Sodium: 135 mmol/L (ref 135–145)

## 2021-10-29 LAB — CBC
HCT: 36.6 % (ref 36.0–46.0)
Hemoglobin: 11.6 g/dL — ABNORMAL LOW (ref 12.0–15.0)
MCH: 26.7 pg (ref 26.0–34.0)
MCHC: 31.7 g/dL (ref 30.0–36.0)
MCV: 84.1 fL (ref 80.0–100.0)
Platelets: 274 10*3/uL (ref 150–400)
RBC: 4.35 MIL/uL (ref 3.87–5.11)
RDW: 15.3 % (ref 11.5–15.5)
WBC: 10.9 10*3/uL — ABNORMAL HIGH (ref 4.0–10.5)
nRBC: 0 % (ref 0.0–0.2)

## 2021-10-29 LAB — HEMOGLOBIN A1C
Hgb A1c MFr Bld: 8.8 % — ABNORMAL HIGH (ref 4.8–5.6)
Mean Plasma Glucose: 205.86 mg/dL

## 2021-10-29 LAB — GLUCOSE, CAPILLARY
Glucose-Capillary: 285 mg/dL — ABNORMAL HIGH (ref 70–99)
Glucose-Capillary: 238 mg/dL — ABNORMAL HIGH (ref 70–99)
Glucose-Capillary: 158 mg/dL — ABNORMAL HIGH (ref 70–99)
Glucose-Capillary: 247 mg/dL — ABNORMAL HIGH (ref 70–99)

## 2021-10-29 MED ORDER — ENSURE MAX PROTEIN PO LIQD
11.0000 [oz_av] | Freq: Two times a day (BID) | ORAL | Status: DC
  Administered 2021-10-29 – 2021-10-31 (×4): 11 [oz_av] via ORAL
  Filled 2021-10-29 (×6): qty 330

## 2021-10-29 MED ORDER — EMPAGLIFLOZIN 10 MG PO TABS
10.0000 mg | ORAL_TABLET | Freq: Every day | ORAL | Status: DC
  Administered 2021-10-29 – 2021-10-31 (×3): 10 mg via ORAL
  Filled 2021-10-29 (×3): qty 1

## 2021-10-29 NOTE — Evaluation (Signed)
Occupational Therapy Evaluation Patient Details Name: [name] MRN: [id number] DOB: 1980/08/05 Today's Date: 10/29/2021   History of Present Illness 41 yo female sent to ED from wound care office. CT and MRI confirmed new abscess at the residual limb site R TMA. ortho consulted and pt underwent R foot I & D on 10/27/21.  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 Patient was noted to have increased difficulty with maintaining WB precautions in standing especially with transfers and attempts at mobility. Patient will need more training at w/c level (patient has w/c at home and is able to fit in house).  Patient was educated on importance of maintaining WB restrictions when moving to promote healing. Patient verbalized understanding. Patient was noted to have decreased functional activity tolerance, decreased endurance, decreased standing balance, decreased safety awareness, and decreased knowledge of AD/AE impacting participation in ADLs. Patient would continue to benefit from skilled OT services at this time while admitted and after d/c to address noted deficits in order to improve overall safety and independence in ADLs.       Recommendations for follow up therapy are one component of a multi-disciplinary discharge planning process, led by the attending physician.  Recommendations may be updated based on patient status, additional functional criteria and insurance authorization.   Follow Up Recommendations  Home health OT    Assistance Recommended at Discharge Frequent or constant Supervision/Assistance  Patient can return home with the following A little help with  walking and/or transfers;A little help with bathing/dressing/bathroom;Assistance with cooking/housework;Direct supervision/assist for financial management;Assist for transportation;Help with stairs or ramp for entrance;Direct supervision/assist for medications management    Functional Status Assessment  Patient has had a recent decline in their functional status and demonstrates the ability to make significant improvements in function in a reasonable and predictable amount of time.  Equipment Recommendations  Other (comment) (drop arm BSC)    Recommendations for Other Services       Precautions / Restrictions Precautions Precautions: Fall Restrictions Weight Bearing Restrictions: Yes RLE Weight Bearing: Touchdown weight bearing Other Position/Activity Restrictions: through heel      Mobility Bed Mobility Overal bed mobility: Needs Assistance Bed Mobility: Supine to Sit     Supine to sit: Supervision     General bed mobility comments: for lines, safety. no physical assist    Transfers Overall transfer level: Needs assistance Equipment used: Rolling walker (2 wheels) Transfers: Sit to/from Stand Sit to Stand: Min guard, Min assist           General transfer comment: cues for hand placement and RLE position to maintain TDWB      Balance Overall balance assessment: Needs assistance         Standing balance support: During functional activity, Reliant on assistive device for balance Standing balance-Leahy Scale: Poor Standing balance comment: unable to maintain WB restrictions                           ADL either performed or assessed with clinical judgement   ADL Overall ADL's : Needs assistance/impaired Eating/Feeding: Set up;Sitting   Grooming: Set up;Sitting   Upper Body Bathing: Set up;Sitting   Lower Body Bathing:  Maximal assistance;Sitting/lateral leans   Upper Body Dressing : Set up;Sitting   Lower Body Dressing: Maximal  assistance;Sitting/lateral leans;Bed level Lower Body Dressing Details (indicate cue type and reason): to don post op shoe on RLE. patient was able to don skirt overhead and pull down over hips sitting EOB with increased time. needed min A to adjust in standing Toilet Transfer: Minimal assistance;Stand-pivot Toilet Transfer Details (indicate cue type and reason): to recliner with patient unable to maintain WB restrictions with patient having difficult time understanding WB restrcitions. patient was provided with visual and verbal education during her movement and while she was resting. patient is unable to hop and maintain NWB status at this time either. scooting was discussed and plans to be worked on during next session. Toileting- Clothing Manipulation and Hygiene: Maximal assistance;Bed level         General ADL Comments: patient reported that she walked herself to the bathroom the previous evening because she needed to use restroom prior to nursing being able to make it into room. patient was educated on how this was extremely difficult to complete while maintaining WB restrictions and was advised on not walking on RLE. patient verbalized understanding.     Vision Baseline Vision/History: 1 Wears glasses       Perception     Praxis      Pertinent Vitals/Pain Pain Assessment Pain Assessment: 0-10 Pain Score: 7  Pain Location: R foot Pain Descriptors / Indicators: Discomfort, Grimacing, Guarding Pain Intervention(s): Limited activity within patient's tolerance, Monitored during session, Premedicated before session     Hand Dominance Right   Extremity/Trunk Assessment Upper Extremity Assessment Upper Extremity Assessment: Overall WFL for tasks assessed   Lower Extremity Assessment Lower Extremity Assessment: Defer to PT evaluation RLE Deficits / Details: grossly WFL   Cervical / Trunk Assessment Cervical / Trunk Assessment: Normal   Communication  Communication Communication: No difficulties   Cognition Arousal/Alertness: Awake/alert Behavior During Therapy: WFL for tasks assessed/performed Overall Cognitive Status: Within Functional Limits for tasks assessed                                       General Comments       Exercises     Shoulder Instructions      Home Living Family/patient expects to be discharged to:: Private residence Living Arrangements: Parent Available Help at Discharge: Family;Available PRN/intermittently Type of Home: House Home Access: Stairs to enter Entrance Stairs-Number of Steps: 3 Entrance Stairs-Rails:  (one rail in the middle) Home Layout: Two level;Able to live on main level with bedroom/bathroom     Bathroom Shower/Tub: Teacher, early years/pre: Standard     Home Equipment: Conservation officer, nature (2 wheels);Cane - single point;Wheelchair - manual          Prior Functioning/Environment Prior Level of Function : Independent/Modified Independent                        OT Problem List: Decreased activity tolerance;Impaired balance (sitting and/or standing);Decreased safety awareness;Pain;Obesity;Decreased knowledge of precautions;Decreased knowledge of use of DME or AE;Decreased strength      OT Treatment/Interventions: Self-care/ADL training;Therapeutic exercise;Neuromuscular education;Energy conservation;DME and/or AE instruction;Therapeutic activities;Balance training;Patient/family education    OT Goals(Current goals can be found in the care plan section) Acute Rehab OT Goals Patient Stated Goal: to go home soon OT Goal Formulation: With patient Time For Goal Achievement:  11/12/21 Potential to Achieve Goals: Fair  OT Frequency: Min 2X/week    Co-evaluation PT/OT/SLP Co-Evaluation/Treatment: Yes Reason for Co-Treatment: To address functional/ADL transfers PT goals addressed during session: Mobility/safety with mobility OT goals addressed during  session: ADL's and self-care      AM-PAC OT "6 Clicks" Daily Activity     Outcome Measure Help from another person eating meals?: None Help from another person taking care of personal grooming?: A Little Help from another person toileting, which includes using toliet, bedpan, or urinal?: A Lot Help from another person bathing (including washing, rinsing, drying)?: A Lot Help from another person to put on and taking off regular upper body clothing?: A Little Help from another person to put on and taking off regular lower body clothing?: A Lot 6 Click Score: 16   End of Session Equipment Utilized During Treatment: Gait belt;Rolling walker (2 wheels) Nurse Communication: Precautions  Activity Tolerance: Patient tolerated treatment well Patient left: in chair;with call bell/phone within reach;with chair alarm set  OT Visit Diagnosis: Unsteadiness on feet (R26.81);Other abnormalities of gait and mobility (R26.89);Muscle weakness (generalized) (M62.81);Pain                Time: 1610-9604 OT Time Calculation (min): 26 min Charges:  OT General Charges $OT Visit: 1 Visit OT Evaluation $OT Eval Moderate Complexity: 1 Mod  [name] OTR/L, MS Acute Rehabilitation Department Office# [phone]   Marcellina Millin 10/29/2021, 4:48 PM

## 2021-10-29 NOTE — Evaluation (Signed)
Physical Therapy Evaluation Patient Details Name: THEODORE RAHRIG MRN: [id number] DOB: 28-Jun-1980 Today's Date: 10/29/2021  History of Present Illness  41 yo female sent to ED from wound care office. CT and MRI confirmed new abscess at the stump site R TMA. ortho consulted and pt underwent R foot I & D on 10/27/21.  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  Pt is unable to maintain TDWB with attempts to take steps/amb. Will need to work towards increasing pt independence with transfers so she may mobilize in her w/c at home.  Pt will need non-emergent ambulance transport home as she will not be able to ascend 3 steps and maintain TDWB.    Pt currently with functional limitations due to the deficits listed below (see PT Problem List). Pt will benefit from skilled PT to increase their independence and safety with mobility to allow discharge to the venue listed below.          Recommendations for follow up therapy are one component of a multi-disciplinary discharge planning process, led by the attending physician.  Recommendations may be updated based on patient status, additional functional criteria and insurance authorization.  Follow Up Recommendations Home health PT      Assistance Recommended at Discharge Intermittent Supervision/Assistance  Patient can return home with the following  A little help with walking and/or transfers;Assistance with cooking/housework;Assist for transportation;Help with stairs or ramp for entrance    Equipment Recommendations None recommended by PT  Recommendations for Other Services       Functional Status Assessment Patient has had a recent decline in their  functional status and demonstrates the ability to make significant improvements in function in a reasonable and predictable amount of time.     Precautions / Restrictions Precautions Precautions: Fall Restrictions RLE Weight Bearing: Touchdown weight bearing      Mobility  Bed Mobility Overal bed mobility: Needs Assistance Bed Mobility: Supine to Sit     Supine to sit: Supervision     General bed mobility comments: for lines, safety. no physical assist    Transfers Overall transfer level: Needs assistance Equipment used: Rolling walker (2 wheels) Transfers: Sit to/from Stand Sit to Stand: Min guard, Min assist           General transfer comment: cues for hand placement and RLE position to maintain TDWB    Ambulation/Gait Ambulation/Gait assistance: Min assist Gait Distance (Feet): 5 Feet Assistive device: Rolling walker (2 wheels)         General Gait Details: verbal cues for sequence, TDWB, RLE position and posture. pt is unable to maintain TDWB  with wt shifting  Stairs            Wheelchair Mobility    Modified Rankin (Stroke Patients Only)       Balance Overall balance assessment: Needs assistance         Standing balance support: During functional activity, Reliant on assistive device for balance Standing balance-Leahy Scale: Poor                               Pertinent Vitals/Pain Pain Assessment Pain Assessment: 0-10 Pain Score: 7  Pain Descriptors / Indicators: Grimacing Pain Intervention(s): Limited activity  within patient's tolerance, Monitored during session, Repositioned    Home Living Family/patient expects to be discharged to:: Private residence Living Arrangements: Parent Available Help at Discharge: Family;Available PRN/intermittently Type of Home: House Home Access: Stairs to enter Entrance Stairs-Rails:  (one rail in the middle) Technical brewer of Steps: 3   Home Layout: Two level;Able to live  on main level with bedroom/bathroom Home Equipment: Rolling Walker (2 wheels);Cane - single point;Wheelchair - manual      Prior Function Prior Level of Function : Independent/Modified Independent                     Hand Dominance   Dominant Hand: Right    Extremity/Trunk Assessment   Upper Extremity Assessment Upper Extremity Assessment: Defer to OT evaluation    Lower Extremity Assessment Lower Extremity Assessment: RLE deficits/detail RLE Deficits / Details: grossly WFL       Communication   Communication: No difficulties  Cognition Arousal/Alertness: Awake/alert Behavior During Therapy: WFL for tasks assessed/performed Overall Cognitive Status: Within Functional Limits for tasks assessed                                          General Comments      Exercises     Assessment/Plan    PT Assessment Patient needs continued PT services  PT Problem List Decreased strength;Decreased mobility;Decreased activity tolerance;Decreased balance;Decreased knowledge of use of DME       PT Treatment Interventions DME instruction;Therapeutic exercise;Functional mobility training;Therapeutic activities;Patient/family education    PT Goals (Current goals can be found in the Care Plan section)  Acute Rehab PT Goals Patient Stated Goal: foot healed PT Goal Formulation: With patient Time For Goal Achievement: 11/12/21 Potential to Achieve Goals: Good    Frequency Min 3X/week     Co-evaluation PT/OT/SLP Co-Evaluation/Treatment: Yes Reason for Co-Treatment: To address functional/ADL transfers PT goals addressed during session: Mobility/safety with mobility;Proper use of DME;Balance         AM-PAC PT "6 Clicks" Mobility  Outcome Measure Help needed turning from your back to your side while in a flat bed without using bedrails?: A Little Help needed moving from lying on your back to sitting on the side of a flat bed without using bedrails?: A  Little Help needed moving to and from a bed to a chair (including a wheelchair)?: A Little Help needed standing up from a chair using your arms (e.g., wheelchair or bedside chair)?: A Little Help needed to walk in hospital room?: A Lot Help needed climbing 3-5 steps with a railing? : Total 6 Click Score: 15    End of Session Equipment Utilized During Treatment: Gait belt Activity Tolerance: Patient tolerated treatment well;Patient limited by fatigue Patient left: with call bell/phone within reach;in chair;with chair alarm set Nurse Communication: Mobility status PT Visit Diagnosis: Other abnormalities of gait and mobility (R26.89);Difficulty in walking, not elsewhere classified (R26.2)    Time: 7893-8101 PT Time Calculation (min) (ACUTE ONLY): 26 min   Charges:   PT Evaluation $PT Eval Low Complexity: [place], PT  Acute Rehab Dept [facility]) [phone]  [facility] Weekend Pager [facility] only)  [phone]  10/29/2021   [facility] 10/29/2021, 3:38 PM

## 2021-10-29 NOTE — Progress Notes (Signed)
Bilateral lower extremity venous duplex has been completed. Preliminary results can be found in CV Proc through chart review.   10/29/21 9:27 AM [name] RVT

## 2021-10-29 NOTE — Progress Notes (Signed)
Subjective: 2 Days Post-Op Procedure(s) (LRB): IRRIGATION AND DEBRIDEMENT WOUND Foot (Right)  Patient reports pain as mild.  She is in good spirits and has excellent appetite.  Objective:   VITALS:  Temp:  [98.2 F (36.8 C)-98.5 F (36.9 C)] 98.3 F (36.8 C) (10/16 0609) Pulse Rate:  [74-89] 74 (10/16 0609) Resp:  [18-19] 19 (10/16 0609) BP: (109-138)/(55-69) 109/55 (10/16 0609) SpO2:  [98 %-100 %] 98 % (10/16 0609)  Gen: AAOx3, NAD Comfortable at rest  Right Lower Extremity: Skin intact, packing in place No TTP, no erythema/drainage HF/KE/KF/ADF/APF 5/5 SILT throughout CR < 2s    LABS Recent Labs    10/28/21 0332 10/29/21 0316  HGB 12.3 11.6*  WBC 10.8* 10.9*  PLT 281 274   Recent Labs    10/28/21 0332 10/29/21 0316  NA 132* 135  K 4.3 4.2  CL 101 103  CO2 21* 23  BUN 19 25*  CREATININE 0.88 0.81  GLUCOSE 336* 343*   No results for input(s): "LABPT", "INR" in the last 72 hours.   Assessment/Plan: 2 Days Post-Op Procedure(s) (LRB): IRRIGATION AND DEBRIDEMENT WOUND Foot (Right)  -stable on RNF -strict touch down weightbearing operative extremity, maximum elevation -bedside nursing to do daily wound packing with sterile iodoform gauze -follow up with outpatient wound care -DVT ppx per primary team; eliquis continuation/discontinuation per primary team discretion -follow up as outpatient with me in 7-10 days for wound check  Armond Hang 10/29/2021, 11:36 AM

## 2021-10-29 NOTE — Discharge Instructions (Signed)

## 2021-10-29 NOTE — Progress Notes (Signed)
PROGRESS NOTE    [name]  JJK:[id number] DOB: Apr 17, 1980 DOA: 10/25/2021 PCP: Grayce Sessions, NP    Brief Narrative:  41 y/o female with history of IDDM, HTN, HLD, Obesity class 3, prior right transmetatarsal amputation, admitted with right foot infection and possible abscess. Orthopedics consulted. She is on IV antibiotics.    Assessment & Plan:   Principal Problem:   Diabetic foot infection (HCC) Active Problems:   Type 2 diabetes mellitus with hyperglycemia (HCC)   Class 3 obesity (HCC)   Hyperlipidemia   HTN (hypertension)   Deep vein thrombosis (DVT) of right lower extremity (HCC)   Diabetic right foot infection -pre existing right foot TMA -CT/MRI show possible abscess in midfoot, no clear osteomyelitis -she is on IV antibiotics -orthopedics following -Status post incision and drainage 10/14 -Intraoperative cultures currently growing gram-positive cocci -Discontinue Flagyl, cefepime, will continue with vancomycin for now  Insulin dependent DM, uncontrolled with hyperglycemia -patient takes metformin, lantus and trulicity prior to admission -admitted with hyperglycemia -continue on lantus and SSI -counseled on importance of dietary compliance -A1c 8.8 -Seen by RD for dietary education -Discussed starting SGLT2 inhibitor.  I think this may be beneficial for the patient in terms of managing her diabetes, it is also weight negative.  We discussed increased risk of UTIs, increased risk of ketosis and the importance of frequent blood sugar checks.  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 -Initial DVT diagnosed 01/2021 after undergoing her first foot surgery in December -Follow-up ultrasound from 02/2021 showed that right lower extremity DVT had resolved -Venous Dopplers  repeated today that did not show any evidence of ongoing DVT in either extremity -Discussed with patient the possibility of discontinuing further anticoagulation -Since the patient is mostly nonambulatory at this point and may be using her wheelchair more due to her recent surgery, right now may not be the best time to discontinue anticoagulation since she is at higher risk for recurrent DVT due to immobility.   -I suspect when she is more mobile, it would be reasonable to discontinue anticoagulation in the next few weeks.  This can be discussed further by her primary care physician  Obesity class 3 -BMI 46.07 -discussed with patient importance of physical activity and healthy eating habits -discussed that she should consider bariatric surgery evaluation due to her elevated BMI and difficult to control DM, not only for weight loss but also for metabolic improvements     DVT prophylaxis: Place and maintain sequential compression device Start: 10/26/21 1858 apixaban (ELIQUIS) tablet 5 mg  Code Status: full code Family Communication: discussed with patient Disposition Plan: Status is: Inpatient Remains inpatient appropriate because: needs ortho management, surgical intervention     Consultants:  orthopedics  Procedures:    Antimicrobials:  Vancomycin 10/13> Cefepime 10/13> 10/15 Flagyl 10/13> 10/15   Subjective: Worked with PT today, did have some difficulty ambulating.  Overall pain is reasonably controlled.  Objective: Vitals:   10/28/21 1336 10/28/21 2217 10/29/21 0609 10/29/21 1340  BP: 138/69 127/69 (!) 109/55 102/62  Pulse: 89 79 74 78  Resp: 18 18 19 18   Temp: 98.5 F (36.9 C) 98.2 F (36.8 C) 98.3 F (36.8 C) 98.5 F (36.9 C)  TempSrc: Oral Oral    SpO2: 100% 99% 98% 100%  Weight:  Height:        Intake/Output Summary (Last 24 hours) at 10/29/2021 1857 Last data filed at 10/29/2021 1342 Gross per 24 hour  Intake 940 ml  Output 2250 ml  Net -1310 ml    Filed Weights   10/25/21 2339  Weight: (!) 141.5 kg    Examination:  General exam: Appears calm and comfortable  Respiratory system: Clear to auscultation. Respiratory effort normal. Cardiovascular system: S1 & S2 heard, RRR. No JVD, murmurs, rubs, gallops or clicks. No pedal edema. Gastrointestinal system: Abdomen is nondistended, soft and nontender. No organomegaly or masses felt. Normal bowel sounds heard. Central nervous system: Alert and oriented. No focal neurological deficits. Extremities: right foot TMA, currently foot is in dressing Skin: No rashes, lesions or ulcers Psychiatry: Judgement and insight appear normal. Mood & affect appropriate.     Data Reviewed: I have personally reviewed following labs and imaging studies  CBC: Recent Labs  Lab 10/26/21 0041 10/28/21 0332 10/29/21 0316  WBC 9.4 10.8* 10.9*  NEUTROABS 5.9  --   --   HGB 12.1 12.3 11.6*  HCT 37.4 38.8 36.6  MCV 83.9 82.7 84.1  PLT 247 281 078   Basic Metabolic Panel: Recent Labs  Lab 10/26/21 0041 10/28/21 0332 10/29/21 0316  NA 134* 132* 135  K 4.0 4.3 4.2  CL 101 101 103  CO2 22 21* 23  GLUCOSE 411* 336* 343*  BUN 12 19 25*  CREATININE 0.80 0.88 0.81  CALCIUM 8.8* 8.9 8.6*   GFR: Estimated Creatinine Clearance: 140.4 mL/min (by C-G formula based on SCr of 0.81 mg/dL). Liver Function Tests: Recent Labs  Lab 10/26/21 0041  AST 17  ALT 7  ALKPHOS 111  BILITOT 0.4  PROT 7.0  ALBUMIN 3.2*   No results for input(s): "LIPASE", "AMYLASE" in the last 168 hours. No results for input(s): "AMMONIA" in the last 168 hours. Coagulation Profile: No results for input(s): "INR", "PROTIME" in the last 168 hours. Cardiac Enzymes: No results for input(s): "CKTOTAL", "CKMB", "CKMBINDEX", "TROPONINI" in the last 168 hours. BNP (last 3 results) No results for input(s): "PROBNP" in the last 8760 hours. HbA1C: Recent Labs    10/29/21 0316  HGBA1C 8.8*   CBG: Recent Labs  Lab  10/28/21 1659 10/28/21 2219 10/29/21 0739 10/29/21 1152 10/29/21 1549  GLUCAP 342* 312* 285* 238* 158*   Lipid Profile: No results for input(s): "CHOL", "HDL", "LDLCALC", "TRIG", "CHOLHDL", "LDLDIRECT" in the last 72 hours. Thyroid Function Tests: No results for input(s): "TSH", "T4TOTAL", "FREET4", "T3FREE", "THYROIDAB" in the last 72 hours. Anemia Panel: No results for input(s): "VITAMINB12", "FOLATE", "FERRITIN", "TIBC", "IRON", "RETICCTPCT" in the last 72 hours. Sepsis Labs: No results for input(s): "PROCALCITON", "LATICACIDVEN" in the last 168 hours.  Recent Results (from the past 240 hour(s))  Blood culture (routine x 2)     Status: None (Preliminary result)   Collection Time: 10/26/21 12:41 AM   Specimen: BLOOD  Result Value Ref Range Status   Specimen Description   Final    BLOOD SITE NOT SPECIFIED Performed at [facility] [address], [place], [place] [address]    Special Requests   Final    BOTTLES DRAWN AEROBIC AND ANAEROBIC Blood Culture adequate volume Performed at [facility] [address], [place], [place] [address]    Culture   Final    NO GROWTH 3 DAYS Performed at [facility], [place] [address], [place], [place] [address]    Report Status PENDING  Incomplete  Blood culture (routine x 2)     Status: None (Preliminary result)   Collection Time: 10/26/21 12:53 AM   Specimen: BLOOD  Result Value Ref Range Status   Specimen Description   Final    BLOOD SITE NOT SPECIFIED Performed at [facility], [address] [place], [place] [address]    Special Requests   Final    BOTTLES DRAWN AEROBIC AND ANAEROBIC Blood Culture adequate volume Performed at [facility], [address] [place], [place] [address]    Culture   Final    NO GROWTH 3 DAYS Performed at [facility], [address] [place], [place] [address]    Report Status PENDING  Incomplete  Surgical pcr  screen     Status: None   Collection Time: 10/27/21  8:20 AM   Specimen: Nasal Mucosa; Nasal Swab  Result Value Ref Range Status   MRSA, PCR NEGATIVE NEGATIVE Final   Staphylococcus aureus NEGATIVE NEGATIVE Final    Comment: (NOTE) The Xpert SA Assay (FDA approved for NASAL specimens in patients 40 years of age and older), is one component of a comprehensive surveillance program. It is not intended to diagnose infection nor to guide or monitor treatment. Performed at [facility], [address] [place], [place] [address]   Aerobic/Anaerobic Culture w Gram Stain (surgical/deep wound)     Status: None (Preliminary result)   Collection Time: 10/27/21  2:11 PM   Specimen: PATH Other; Tissue  Result Value Ref Range Status   Specimen Description TISSUE RIGHT FOOT  Final   Special Requests ID   D  Final   Gram Stain   Final    NO WBC SEEN RARE GRAM POSITIVE COCCI IN SINGLES Performed at [facility], [address] [place], [place] [address]    Culture   Final    CULTURE REINCUBATED FOR BETTER GROWTH NO ANAEROBES ISOLATED; CULTURE IN PROGRESS FOR 5 DAYS    Report Status PENDING  Incomplete  Aerobic/Anaerobic Culture w Gram Stain (surgical/deep wound)     Status: None (Preliminary result)   Collection Time: 10/27/21  2:13 PM   Specimen: PATH Other; Tissue  Result Value Ref Range Status   Specimen Description ABSCESS RIGHT FOOT  Final   Special Requests ID A  Final   Gram Stain   Final    NO WBC SEEN RARE GRAM POSITIVE COCCI IN PAIRS Performed at [facility], [address] [place], [place] [address]    Culture   Final    CULTURE REINCUBATED FOR BETTER GROWTH NO ANAEROBES ISOLATED; CULTURE IN PROGRESS FOR 5 DAYS    Report Status PENDING  Incomplete  Aerobic/Anaerobic Culture w Gram Stain (surgical/deep wound)     Status: None (Preliminary result)   Collection Time: 10/27/21  2:14 PM   Specimen: PATH Soft tissue  Result Value Ref Range Status    Specimen Description ABSCESS RIGHT FOOT  Final   Special Requests ID B  Final   Gram Stain   Final    NO WBC SEEN RARE GRAM POSITIVE COCCI IN SINGLES IN PAIRS Performed at [facility], [address] [place], [place] [address]    Culture   Final    CULTURE REINCUBATED FOR BETTER GROWTH NO ANAEROBES ISOLATED; CULTURE IN PROGRESS FOR 5 DAYS    Report Status PENDING  Incomplete  Aerobic/Anaerobic Culture w Gram Stain (surgical/deep wound)     Status: None (  Preliminary result)   Collection Time: 10/27/21  2:15 PM   Specimen: PATH Soft tissue  Result Value Ref Range Status   Specimen Description TISSUE RIGHT FOOT  Final   Special Requests ID C  Final   Gram Stain   Final    NO ORGANISMS SEEN NO WBC SEEN Performed at [facility], [address] [place], [place] [address]    Culture   Final    CULTURE REINCUBATED FOR BETTER GROWTH NO ANAEROBES ISOLATED; CULTURE IN PROGRESS FOR 5 DAYS    Report Status PENDING  Incomplete         Radiology Studies: VAS [place] LOWER EXTREMITY VENOUS (DVT)  Result Date: 10/29/2021  Lower Venous DVT Study Patient Name:  [name]  Date of Exam:   10/29/2021 Medical Rec #: [id number]       Accession #:    [id number] Date of Birth: 01-30-1980       Patient Gender: F Patient Age:   24 years Exam Location:  [facility] Procedure:      VAS [place] LOWER EXTREMITY VENOUS (DVT) Referring Phys: Durward Mallard Darvin Dials --------------------------------------------------------------------------------  Indications: F/U DVT.  Limitations: Body habitus and poor ultrasound/tissue interface. Comparison Study: 03/08/2021 - RIGHT:                   - There is no obvious evidence of acute deep vein thrombosis                   in the lower                   extremity. Findings improved when compared to prior study.                    - No cystic structure found in the popliteal fossa.                    LEFT:                   - No evidence of common femoral vein  obstruction.                    01/30/2021 - RIGHT:                   - Findings consistent with acute deep vein thrombosis                   involving the right                   common femoral vein, right femoral vein, and right popliteal                   vein.                   - No cystic structure found in the popliteal fossa.                    LEFT:                   - No evidence of deep vein thrombosis in the lower extremity.                   No indirect                   evidence of obstruction proximal to the inguinal  ligament.                   - No cystic structure found in the popliteal fossa. Performing Technologist: Chanda Busing RVT  Examination Guidelines: A complete evaluation includes B-mode imaging, spectral Doppler, color Doppler, and power Doppler as needed of all accessible portions of each vessel. Bilateral testing is considered an integral part of a complete examination. Limited examinations for reoccurring indications may be performed as noted. The reflux portion of the exam is performed with the patient in reverse Trendelenburg.  +---------+---------------+---------+-----------+----------+-------------------+ RIGHT    CompressibilityPhasicitySpontaneityPropertiesThrombus Aging      +---------+---------------+---------+-----------+----------+-------------------+ CFV      Full           Yes      Yes                                      +---------+---------------+---------+-----------+----------+-------------------+ SFJ      Full                                                             +---------+---------------+---------+-----------+----------+-------------------+ FV Prox  Full                                                             +---------+---------------+---------+-----------+----------+-------------------+ FV Mid                  Yes      Yes                                       +---------+---------------+---------+-----------+----------+-------------------+ FV Distal               Yes      Yes                                      +---------+---------------+---------+-----------+----------+-------------------+ PFV      Full                                                             +---------+---------------+---------+-----------+----------+-------------------+ POP      Full           Yes      Yes                                      +---------+---------------+---------+-----------+----------+-------------------+ PTV      Full                                                             +---------+---------------+---------+-----------+----------+-------------------+  PERO                                                  Not well visualized +---------+---------------+---------+-----------+----------+-------------------+   +---------+---------------+---------+-----------+----------+-------------------+ LEFT     CompressibilityPhasicitySpontaneityPropertiesThrombus Aging      +---------+---------------+---------+-----------+----------+-------------------+ CFV      Full           Yes      Yes                                      +---------+---------------+---------+-----------+----------+-------------------+ SFJ      Full                                                             +---------+---------------+---------+-----------+----------+-------------------+ FV Prox  Full                                                             +---------+---------------+---------+-----------+----------+-------------------+ FV Mid                  Yes      Yes                                      +---------+---------------+---------+-----------+----------+-------------------+ FV Distal               Yes      Yes                                      +---------+---------------+---------+-----------+----------+-------------------+  PFV      Full                                                             +---------+---------------+---------+-----------+----------+-------------------+ POP      Full           Yes      Yes                                      +---------+---------------+---------+-----------+----------+-------------------+ PTV      Full                                                             +---------+---------------+---------+-----------+----------+-------------------+ PERO  Not well visualized +---------+---------------+---------+-----------+----------+-------------------+     Summary: RIGHT: - There is no evidence of deep vein thrombosis in the lower extremity. However, portions of this examination were limited- see technologist comments above.  - No cystic structure found in the popliteal fossa.  LEFT: - There is no evidence of deep vein thrombosis in the lower extremity. However, portions of this examination were limited- see technologist comments above.  - No cystic structure found in the popliteal fossa.  *See table(s) above for measurements and observations. Electronically signed by Orlie Pollen on 10/29/2021 at 5:23:56 PM.    Final         Scheduled Meds:  apixaban  5 mg Oral BID   atorvastatin  80 mg Oral Daily   empagliflozin  10 mg Oral Daily   gabapentin  600 mg Oral TID   insulin aspart  0-20 Units Subcutaneous TID WC   insulin aspart  0-5 Units Subcutaneous QHS   insulin glargine-yfgn  70 Units Subcutaneous Daily   lisinopril  2.5 mg Oral Daily   metFORMIN  1,000 mg Oral BID WC   Ensure Max Protein  11 oz Oral BID   Continuous Infusions:  sodium chloride Stopped (10/27/21 1204)   vancomycin 1,500 mg (10/29/21 1200)     LOS: 3 days    Time spent: 65mins    Kathie Dike, MD [facility]   If 7PM-7AM, please contact night-coverage [email or web address]  10/29/2021, 6:57 PM

## 2021-10-29 NOTE — Plan of Care (Signed)
  RD consulted for nutrition education regarding diabetes.   Lab Results  Component Value Date   HGBA1C 8.8 (H) 10/29/2021   Visited pt at bedside this morning. She reports receiving conflicting diet information from various people. She went through DM education when she was first diagnosed but has not seen an RD since. Pt reports eating 1-3 meals/day. She tries to eat healthy but says she is not working and it can get expensive. She will drink Ensure Max protein or other low carb high protein shakes when she can afford them. Notes since including the shakes in her routine her hair and nails have been better. Provided Ensure Max and Glucerna Protein Smart coupons to offset cost for home use. ONS changed to Ensure Max (150kcal, 30g protein and 6g carbs) during admission per pt preference. Reviewed usual diet and encouraged small sustainable changes. She says she knows what she should and shouldn't do and has periods of time when she is very compliant and other times when she is not. Encouraged pt to not beat herself up about dietary indiscretions but instead accept it for 1 meal and then get right back on track. Offer outpt RD follow up, pt agreeable.  RD provided "Carbohydrate Counting for People with Diabetes" handout from the Academy of [employer] and Dietetics. Discussed different food groups and their effects on blood sugar, emphasizing carbohydrate-containing foods. Provided list of carbohydrates and recommended serving sizes of common foods.  Discussed importance of controlled and consistent carbohydrate intake throughout the day. Provided examples of ways to balance meals/snacks and encouraged intake of high-fiber, whole grain complex carbohydrates along with protein containing foods. Encouraged at least 3 meals per day. Teach back method used.  Expect good compliance w/ regular follow up.  Body mass index is 46.07 kg/m. Pt meets criteria for obesity based on current BMI.  Current diet order  is carb modified, patient is consuming approximately 100% of meals at this time. Labs and medications reviewed. No further nutrition interventions warranted at this time. RD contact information provided. If additional nutrition issues arise, please re-consult RD.  Candise Bowens, MS, RD, LDN, CNSC See AMiON for contact information

## 2021-10-30 ENCOUNTER — Other Ambulatory Visit: Payer: Self-pay

## 2021-10-30 DIAGNOSIS — E11628 Type 2 diabetes mellitus with other skin complications: Secondary | ICD-10-CM | POA: Diagnosis not present

## 2021-10-30 DIAGNOSIS — R739 Hyperglycemia, unspecified: Secondary | ICD-10-CM | POA: Diagnosis not present

## 2021-10-30 DIAGNOSIS — L089 Local infection of the skin and subcutaneous tissue, unspecified: Secondary | ICD-10-CM | POA: Diagnosis not present

## 2021-10-30 DIAGNOSIS — E1165 Type 2 diabetes mellitus with hyperglycemia: Secondary | ICD-10-CM | POA: Diagnosis not present

## 2021-10-30 LAB — GLUCOSE, CAPILLARY
Glucose-Capillary: 150 mg/dL — ABNORMAL HIGH (ref 70–99)
Glucose-Capillary: 132 mg/dL — ABNORMAL HIGH (ref 70–99)
Glucose-Capillary: 152 mg/dL — ABNORMAL HIGH (ref 70–99)
Glucose-Capillary: 173 mg/dL — ABNORMAL HIGH (ref 70–99)

## 2021-10-30 MED ORDER — SODIUM CHLORIDE 0.9 % IV SOLN
3.0000 g | Freq: Four times a day (QID) | INTRAVENOUS | Status: DC
  Administered 2021-10-30 – 2021-10-31 (×3): 3 g via INTRAVENOUS
  Filled 2021-10-30 (×5): qty 8

## 2021-10-30 NOTE — Plan of Care (Signed)
  Problem: Activity: Goal: Risk for activity intolerance will decrease Outcome: Progressing   Problem: Pain Managment: Goal: General experience of comfort will improve Outcome: Progressing   Problem: Safety: Goal: Ability to remain free from injury will improve Outcome: Progressing   

## 2021-10-30 NOTE — Progress Notes (Signed)
PROGRESS NOTE    [name]  [id number] DOB: [birth year] DOA: 10/25/2021 PCP: Kerin Perna, NP    Brief Narrative:  41 y/o female with history of IDDM, HTN, HLD, Obesity class 3, prior right transmetatarsal amputation, admitted with right foot infection and possible abscess. Orthopedics consulted. She is on IV antibiotics.    Assessment & Plan:   Principal Problem:   Diabetic foot infection ([place]) Active Problems:   Type 2 diabetes mellitus with hyperglycemia (HCC)   Class 3 obesity (HCC)   Hyperlipidemia   HTN (hypertension)   Deep vein thrombosis (DVT) of right lower extremity (HCC)   Diabetic right foot infection -pre existing right foot TMA -CT/MRI show possible abscess in midfoot, no clear osteomyelitis -she is on IV antibiotics -orthopedics following -Status post incision and drainage 10/14 -Intraoperative cultures currently growing actinomyces -Discontinue Flagyl, cefepime, will continue with vancomycin for now -requested ID consult  Insulin dependent DM, uncontrolled with hyperglycemia -patient takes metformin, lantus and trulicity prior to admission -admitted with hyperglycemia -continue on lantus and SSI -counseled on importance of dietary compliance -A1c 8.8 -Seen by RD for dietary education -Discussed starting SGLT2 inhibitor.  I think this may be beneficial for the patient in terms of managing her diabetes, it is also weight negative.  We discussed increased risk of UTIs, increased risk of ketosis and the importance of frequent blood sugar checks.  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 -Initial DVT diagnosed 01/2021 after undergoing her first foot surgery in December -Follow-up ultrasound from 02/2021 showed that right lower extremity DVT had  resolved -Venous Dopplers repeated today that did not show any evidence of ongoing DVT in either extremity -Discussed with patient the possibility of discontinuing further anticoagulation -Since the patient is mostly nonambulatory at this point and may be using her wheelchair more due to her recent surgery, right now may not be the best time to discontinue anticoagulation since she is at higher risk for recurrent DVT due to immobility.   -I suspect when she is more mobile, it would be reasonable to discontinue anticoagulation in the next few weeks.  This can be discussed further by her primary care physician  Obesity class 3 -BMI 46.07 -discussed with patient importance of physical activity and healthy eating habits -discussed that she should consider bariatric surgery evaluation due to her elevated BMI and difficult to control DM, not only for weight loss but also for metabolic improvements     DVT prophylaxis: Place and maintain sequential compression device Start: 10/26/21 1858 apixaban (ELIQUIS) tablet 5 mg  Code Status: full code Family Communication: discussed with patient Disposition Plan: Status is: Inpatient Remains inpatient appropriate because: discharge home when culture results available and appropriate plan for IV antibiotics     Consultants:  orthopedics  Procedures:    Antimicrobials:  Vancomycin 10/13> Cefepime 10/13> 10/15 Flagyl 10/13> 10/15   Subjective: No new complaints today  Objective: Vitals:   10/28/21 1336 10/28/21 2217 10/29/21 0609 10/29/21 1340  BP: 138/69 127/69 (!) 109/55 102/62  Pulse: 89 79 74 78  Resp: 18 18 19 18   Temp: 98.5 F (36.9 C) 98.2 F (36.8 C) 98.3 F (36.8 C) 98.5 F (36.9 C)  TempSrc: Oral Oral    SpO2: 100% 99% 98% 100%  Weight:      Height:  Intake/Output Summary (Last 24 hours) at 10/29/2021 1857 Last data filed at 10/29/2021 1342 Gross per 24 hour  Intake 940 ml  Output 2250 ml  Net -1310 ml    Filed Weights   10/25/21 2339  Weight: (!) 141.5 kg    Examination:  General exam: Appears calm and comfortable  Respiratory system: Clear to auscultation. Respiratory effort normal. Cardiovascular system: S1 & S2 heard, RRR. No JVD, murmurs, rubs, gallops or clicks. No pedal edema. Gastrointestinal system: Abdomen is nondistended, soft and nontender. No organomegaly or masses felt. Normal bowel sounds heard. Central nervous system: Alert and oriented. No focal neurological deficits. Extremities: right foot TMA, currently foot is in dressing Skin: No rashes, lesions or ulcers Psychiatry: Judgement and insight appear normal. Mood & affect appropriate.     Data Reviewed: I have personally reviewed following labs and imaging studies  CBC: Recent Labs  Lab 10/26/21 0041 10/28/21 0332 10/29/21 0316  WBC 9.4 10.8* 10.9*  NEUTROABS 5.9  --   --   HGB 12.1 12.3 11.6*  HCT 37.4 38.8 36.6  MCV 83.9 82.7 84.1  PLT 247 281 274   Basic Metabolic Panel: Recent Labs  Lab 10/26/21 0041 10/28/21 0332 10/29/21 0316  NA 134* 132* 135  K 4.0 4.3 4.2  CL 101 101 103  CO2 22 21* 23  GLUCOSE 411* 336* 343*  BUN 12 19 25*  CREATININE 0.80 0.88 0.81  CALCIUM 8.8* 8.9 8.6*   GFR: Estimated Creatinine Clearance: 140.4 mL/min (by C-G formula based on SCr of 0.81 mg/dL). Liver Function Tests: Recent Labs  Lab 10/26/21 0041  AST 17  ALT 7  ALKPHOS 111  BILITOT 0.4  PROT 7.0  ALBUMIN 3.2*   No results for input(s): "LIPASE", "AMYLASE" in the last 168 hours. No results for input(s): "AMMONIA" in the last 168 hours. Coagulation Profile: No results for input(s): "INR", "PROTIME" in the last 168 hours. Cardiac Enzymes: No results for input(s): "CKTOTAL", "CKMB", "CKMBINDEX", "TROPONINI" in the last 168 hours. BNP (last 3 results) No results for input(s): "PROBNP" in the last 8760 hours. HbA1C: Recent Labs    10/29/21 0316  HGBA1C 8.8*   CBG: Recent Labs  Lab  10/28/21 1659 10/28/21 2219 10/29/21 0739 10/29/21 1152 10/29/21 1549  GLUCAP 342* 312* 285* 238* 158*   Lipid Profile: No results for input(s): "CHOL", "HDL", "LDLCALC", "TRIG", "CHOLHDL", "LDLDIRECT" in the last 72 hours. Thyroid Function Tests: No results for input(s): "TSH", "T4TOTAL", "FREET4", "T3FREE", "THYROIDAB" in the last 72 hours. Anemia Panel: No results for input(s): "VITAMINB12", "FOLATE", "FERRITIN", "TIBC", "IRON", "RETICCTPCT" in the last 72 hours. Sepsis Labs: No results for input(s): "PROCALCITON", "LATICACIDVEN" in the last 168 hours.  Recent Results (from the past 240 hour(s))  Blood culture (routine x 2)     Status: None (Preliminary result)   Collection Time: 10/26/21 12:41 AM   Specimen: BLOOD  Result Value Ref Range Status   Specimen Description   Final    BLOOD SITE NOT SPECIFIED Performed at [facility], [address] [place], [place] [address]    Special Requests   Final    BOTTLES DRAWN AEROBIC AND ANAEROBIC Blood Culture adequate volume Performed at [facility], [address], [place], [place] [address]    Culture   Final    NO GROWTH 3 DAYS Performed at [facility], [address] [place], [place] [address]    Report Status PENDING  Incomplete  Blood culture (routine x 2)  Status: None (Preliminary result)   Collection Time: 10/26/21 12:53 AM   Specimen: BLOOD  Result Value Ref Range Status   Specimen Description   Final    BLOOD SITE NOT SPECIFIED Performed at [place] [address], [place], [place] [address]    Special Requests   Final    BOTTLES DRAWN AEROBIC AND ANAEROBIC Blood Culture adequate volume Performed at [place] [address], [place], [place] [address]    Culture   Final    NO GROWTH 3 DAYS Performed at [facility], [place] [address] [place], [place] [address]    Report Status PENDING  Incomplete  Surgical pcr  screen     Status: None   Collection Time: 10/27/21  8:20 AM   Specimen: Nasal Mucosa; Nasal Swab  Result Value Ref Range Status   MRSA, PCR NEGATIVE NEGATIVE Final   Staphylococcus aureus NEGATIVE NEGATIVE Final    Comment: (NOTE) The Xpert SA Assay (FDA approved for NASAL specimens in patients 82 years of age and older), is one component of a comprehensive surveillance program. It is not intended to diagnose infection nor to guide or monitor treatment. Performed at [facility], [place] [address], [place], [place] [address]   Aerobic/Anaerobic Culture w Gram Stain (surgical/deep wound)     Status: None (Preliminary result)   Collection Time: 10/27/21  2:11 PM   Specimen: PATH Other; Tissue  Result Value Ref Range Status   Specimen Description TISSUE RIGHT FOOT  Final   Special Requests ID   D  Final   Gram Stain   Final    NO WBC SEEN RARE GRAM POSITIVE COCCI IN SINGLES Performed at [facility], [place] [address], [place], [place] [address]    Culture   Final    CULTURE REINCUBATED FOR BETTER GROWTH NO ANAEROBES ISOLATED; CULTURE IN PROGRESS FOR 5 DAYS    Report Status PENDING  Incomplete  Aerobic/Anaerobic Culture w Gram Stain (surgical/deep wound)     Status: None (Preliminary result)   Collection Time: 10/27/21  2:13 PM   Specimen: PATH Other; Tissue  Result Value Ref Range Status   Specimen Description ABSCESS RIGHT FOOT  Final   Special Requests ID A  Final   Gram Stain   Final    NO WBC SEEN RARE GRAM POSITIVE COCCI IN PAIRS Performed at [facility], [place] [address], [place], [place] [address]    Culture   Final    CULTURE REINCUBATED FOR BETTER GROWTH NO ANAEROBES ISOLATED; CULTURE IN PROGRESS FOR 5 DAYS    Report Status PENDING  Incomplete  Aerobic/Anaerobic Culture w Gram Stain (surgical/deep wound)     Status: None (Preliminary result)   Collection Time: 10/27/21  2:14 PM   Specimen: PATH Soft tissue  Result Value Ref Range Status    Specimen Description ABSCESS RIGHT FOOT  Final   Special Requests ID B  Final   Gram Stain   Final    NO WBC SEEN RARE GRAM POSITIVE COCCI IN SINGLES IN PAIRS Performed at [facility], [place] [address], [place], [place] [address]    Culture   Final    CULTURE REINCUBATED FOR BETTER GROWTH NO ANAEROBES ISOLATED; CULTURE IN PROGRESS FOR 5 DAYS    Report Status PENDING  Incomplete  Aerobic/Anaerobic Culture w Gram Stain (surgical/deep wound)     Status: None (Preliminary result)   Collection Time: 10/27/21  2:15 PM  Specimen: PATH Soft tissue  Result Value Ref Range Status   Specimen Description TISSUE RIGHT FOOT  Final   Special Requests ID C  Final   Gram Stain   Final    NO ORGANISMS SEEN NO WBC SEEN Performed at [facility], [address], [place], [place] [address]    Culture   Final    CULTURE REINCUBATED FOR BETTER GROWTH NO ANAEROBES ISOLATED; CULTURE IN PROGRESS FOR 5 DAYS    Report Status PENDING  Incomplete         Radiology Studies: VAS [place] LOWER EXTREMITY VENOUS (DVT)  Result Date: 10/29/2021  Lower Venous DVT Study Patient Name:  [name]  Date of Exam:   10/29/2021 Medical Rec #: [id number]       Accession #:    [id number] Date of Birth: July 08, 1980       Patient Gender: F Patient Age:   66 years Exam Location:  [facility] Procedure:      VAS [place] LOWER EXTREMITY VENOUS (DVT) Referring Phys: Jolaine Artist Rayshawn Visconti --------------------------------------------------------------------------------  Indications: F/U DVT.  Limitations: Body habitus and poor ultrasound/tissue interface. Comparison Study: 03/08/2021 - RIGHT:                   - There is no obvious evidence of acute deep vein thrombosis                   in the lower                   extremity. Findings improved when compared to prior study.                    - No cystic structure found in the popliteal fossa.                    LEFT:                   - No evidence of common femoral vein  obstruction.                    01/30/2021 - RIGHT:                   - Findings consistent with acute deep vein thrombosis                   involving the right                   common femoral vein, right femoral vein, and right popliteal                   vein.                   - No cystic structure found in the popliteal fossa.                    LEFT:                   - No evidence of deep vein thrombosis in the lower extremity.                   No indirect                   evidence of obstruction proximal to the inguinal ligament.                   -  No cystic structure found in the popliteal fossa. Performing Technologist: Oliver Hum RVT  Examination Guidelines: A complete evaluation includes B-mode imaging, spectral Doppler, color Doppler, and power Doppler as needed of all accessible portions of each vessel. Bilateral testing is considered an integral part of a complete examination. Limited examinations for reoccurring indications may be performed as noted. The reflux portion of the exam is performed with the patient in reverse Trendelenburg.  +---------+---------------+---------+-----------+----------+-------------------+ RIGHT    CompressibilityPhasicitySpontaneityPropertiesThrombus Aging      +---------+---------------+---------+-----------+----------+-------------------+ CFV      Full           Yes      Yes                                      +---------+---------------+---------+-----------+----------+-------------------+ SFJ      Full                                                             +---------+---------------+---------+-----------+----------+-------------------+ FV Prox  Full                                                             +---------+---------------+---------+-----------+----------+-------------------+ FV Mid                  Yes      Yes                                       +---------+---------------+---------+-----------+----------+-------------------+ FV Distal               Yes      Yes                                      +---------+---------------+---------+-----------+----------+-------------------+ PFV      Full                                                             +---------+---------------+---------+-----------+----------+-------------------+ POP      Full           Yes      Yes                                      +---------+---------------+---------+-----------+----------+-------------------+ PTV      Full                                                             +---------+---------------+---------+-----------+----------+-------------------+  PERO                                                  Not well visualized +---------+---------------+---------+-----------+----------+-------------------+   +---------+---------------+---------+-----------+----------+-------------------+ LEFT     CompressibilityPhasicitySpontaneityPropertiesThrombus Aging      +---------+---------------+---------+-----------+----------+-------------------+ CFV      Full           Yes      Yes                                      +---------+---------------+---------+-----------+----------+-------------------+ SFJ      Full                                                             +---------+---------------+---------+-----------+----------+-------------------+ FV Prox  Full                                                             +---------+---------------+---------+-----------+----------+-------------------+ FV Mid                  Yes      Yes                                      +---------+---------------+---------+-----------+----------+-------------------+ FV Distal               Yes      Yes                                      +---------+---------------+---------+-----------+----------+-------------------+  PFV      Full                                                             +---------+---------------+---------+-----------+----------+-------------------+ POP      Full           Yes      Yes                                      +---------+---------------+---------+-----------+----------+-------------------+ PTV      Full                                                             +---------+---------------+---------+-----------+----------+-------------------+ PERO  Not well visualized +---------+---------------+---------+-----------+----------+-------------------+     Summary: RIGHT: - There is no evidence of deep vein thrombosis in the lower extremity. However, portions of this examination were limited- see technologist comments above.  - No cystic structure found in the popliteal fossa.  LEFT: - There is no evidence of deep vein thrombosis in the lower extremity. However, portions of this examination were limited- see technologist comments above.  - No cystic structure found in the popliteal fossa.  *See table(s) above for measurements and observations. Electronically signed by Orlie Pollen on 10/29/2021 at 5:23:56 PM.    Final         Scheduled Meds:  apixaban  5 mg Oral BID   atorvastatin  80 mg Oral Daily   empagliflozin  10 mg Oral Daily   gabapentin  600 mg Oral TID   insulin aspart  0-20 Units Subcutaneous TID WC   insulin aspart  0-5 Units Subcutaneous QHS   insulin glargine-yfgn  70 Units Subcutaneous Daily   lisinopril  2.5 mg Oral Daily   metFORMIN  1,000 mg Oral BID WC   Ensure Max Protein  11 oz Oral BID   Continuous Infusions:  sodium chloride Stopped (10/27/21 1204)   vancomycin 1,500 mg (10/29/21 1200)     LOS: 3 days    Time spent: 65mins    Kathie Dike, MD [facility]   If 7PM-7AM, please contact night-coverage [email or web address]  10/29/2021, 6:57 PM

## 2021-10-30 NOTE — Progress Notes (Signed)
Physical Therapy Treatment Patient Details Name: [name] MRN: [id number] DOB: 13-Feb-1980 Today's Date: 10/30/2021   History of Present Illness 41 yo female sent to ED from wound care office. CT and MRI confirmed new abscess at the residual limb site R TMA. ortho consulted and pt underwent R foot I & D on 10/27/21.  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 Reviewed multiple transfers as noted below and pt was able to maintain NWB for all transfer types. Discussed use of w/c at home for mobilization, avoiding ambulation until foot healed and pt allowed incr WBing. Pt verbalizes understanding/importance of this as well.    Recommendations for follow up therapy are one component of a multi-disciplinary discharge planning process, led by the attending physician.  Recommendations may be updated based on patient status, additional functional criteria and insurance authorization.  Follow Up Recommendations  No PT follow up     Assistance Recommended at Discharge Intermittent Supervision/Assistance  Patient can return home with the following A little help with walking and/or transfers;Assistance with cooking/housework;Assist for transportation;Help with stairs or ramp for entrance   Equipment Recommendations  None recommended by PT    Recommendations for Other Services       Precautions / Restrictions Precautions Precautions: Fall Restrictions Weight Bearing Restrictions: Yes RLE Weight Bearing: Touchdown weight bearing Other Position/Activity Restrictions: through heel     Mobility  Bed Mobility Overal bed mobility: Needs Assistance Bed Mobility: Supine to Sit     Supine to sit:  Modified independent (Device/Increase time), HOB elevated          Transfers Overall transfer level: Needs assistance Equipment used: Rolling walker (2 wheels) Transfers: Sit to/from Stand, Bed to chair/wheelchair/BSC Sit to Stand: Min guard Stand pivot transfers: Min guard   Squat pivot transfers: Min guard    Lateral/Scoot Transfers: Supervision, Min guard General transfer comment: cues for RLE position and TDWB, pt able to maintain with 3 types of transfers reviewed, including stand pivot with RW going towards pt  R side.    Ambulation/Gait                   Stairs             Wheelchair Mobility    Modified Rankin (Stroke Patients Only)       Balance           Standing balance support: During functional activity, Reliant on assistive device for balance Standing balance-Leahy Scale: Poor Standing balance comment: pt was able to comply with TDWB to NWB status this date                            Cognition Arousal/Alertness: Awake/alert Behavior During Therapy: WFL for tasks assessed/performed Overall Cognitive Status: Within Functional Limits for tasks assessed                                          Exercises General Exercises - Lower Extremity Ankle Circles/Pumps: AROM, 10 reps, Right    General Comments        Pertinent Vitals/Pain Pain Assessment Pain  Assessment: 0-10 Pain Score: 2  Pain Location: R foot Pain Descriptors / Indicators: Discomfort Pain Intervention(s): Limited activity within patient's tolerance, Monitored during session, Repositioned    Home Living                          Prior Function            PT Goals (current goals can now be found in the care plan section) Acute Rehab PT Goals Patient Stated Goal: foot healed PT Goal Formulation: With patient Time For Goal Achievement: 11/12/21 Potential to Achieve Goals: Good Progress towards PT goals: Progressing toward  goals    Frequency    Min 5X/week      PT Plan Current plan remains appropriate    Co-evaluation              AM-PAC PT "6 Clicks" Mobility   Outcome Measure  Help needed turning from your back to your side while in a flat bed without using bedrails?: None Help needed moving from lying on your back to sitting on the side of a flat bed without using bedrails?: A Little Help needed moving to and from a bed to a chair (including a wheelchair)?: A Little Help needed standing up from a chair using your arms (e.g., wheelchair or bedside chair)?: A Little Help needed to walk in hospital room?: A Lot Help needed climbing 3-5 steps with a railing? : Total 6 Click Score: 16    End of Session Equipment Utilized During Treatment: Gait belt Activity Tolerance: Patient tolerated treatment well Patient left: with call bell/phone within reach;in chair;with chair alarm set Nurse Communication: Mobility status PT Visit Diagnosis: Other abnormalities of gait and mobility (R26.89);Difficulty in walking, not elsewhere classified (R26.2)     Time: 6144-3154 PT Time Calculation (min) (ACUTE ONLY): 19 min  Charges:  $Therapeutic Activity: 8-22 mins                     Delice Bison, PT  Acute Rehab Dept [facility]) [phone]  [facility] Weekend Pager [facility] only)  [phone]  10/30/2021    [facility] 10/30/2021, 11:15 AM

## 2021-10-31 ENCOUNTER — Other Ambulatory Visit (HOSPITAL_COMMUNITY): Payer: Self-pay

## 2021-10-31 ENCOUNTER — Other Ambulatory Visit (INDEPENDENT_AMBULATORY_CARE_PROVIDER_SITE_OTHER): Payer: Medicaid Other | Admitting: Primary Care

## 2021-10-31 DIAGNOSIS — E1165 Type 2 diabetes mellitus with hyperglycemia: Secondary | ICD-10-CM

## 2021-10-31 DIAGNOSIS — Z7984 Long term (current) use of oral hypoglycemic drugs: Secondary | ICD-10-CM | POA: Diagnosis not present

## 2021-10-31 DIAGNOSIS — I159 Secondary hypertension, unspecified: Secondary | ICD-10-CM

## 2021-10-31 DIAGNOSIS — E785 Hyperlipidemia, unspecified: Secondary | ICD-10-CM | POA: Diagnosis not present

## 2021-10-31 DIAGNOSIS — Z794 Long term (current) use of insulin: Secondary | ICD-10-CM

## 2021-10-31 DIAGNOSIS — E11628 Type 2 diabetes mellitus with other skin complications: Secondary | ICD-10-CM | POA: Diagnosis not present

## 2021-10-31 DIAGNOSIS — L089 Local infection of the skin and subcutaneous tissue, unspecified: Secondary | ICD-10-CM | POA: Diagnosis not present

## 2021-10-31 LAB — CBC
HCT: 38.6 % (ref 36.0–46.0)
Hemoglobin: 12.4 g/dL (ref 12.0–15.0)
MCH: 27 pg (ref 26.0–34.0)
MCHC: 32.1 g/dL (ref 30.0–36.0)
MCV: 83.9 fL (ref 80.0–100.0)
Platelets: 295 10*3/uL (ref 150–400)
RBC: 4.6 MIL/uL (ref 3.87–5.11)
RDW: 15.6 % — ABNORMAL HIGH (ref 11.5–15.5)
WBC: 11.3 10*3/uL — ABNORMAL HIGH (ref 4.0–10.5)
nRBC: 0 % (ref 0.0–0.2)

## 2021-10-31 LAB — CULTURE, BLOOD (ROUTINE X 2)
Culture: NO GROWTH
Culture: NO GROWTH
Special Requests: ADEQUATE
Special Requests: ADEQUATE

## 2021-10-31 LAB — BASIC METABOLIC PANEL
Anion gap: 9 (ref 5–15)
BUN: 30 mg/dL — ABNORMAL HIGH (ref 6–20)
CO2: 26 mmol/L (ref 22–32)
Calcium: 9.5 mg/dL (ref 8.9–10.3)
Chloride: 101 mmol/L (ref 98–111)
Creatinine, Ser: 0.83 mg/dL (ref 0.44–1.00)
GFR, Estimated: >60 mL/min (ref >60)
Glucose, Bld: 142 mg/dL — ABNORMAL HIGH (ref 70–99)
Potassium: 4 mmol/L (ref 3.5–5.1)
Sodium: 136 mmol/L (ref 135–145)

## 2021-10-31 LAB — GLUCOSE, CAPILLARY
Glucose-Capillary: 143 mg/dL — ABNORMAL HIGH (ref 70–99)
Glucose-Capillary: 139 mg/dL — ABNORMAL HIGH (ref 70–99)
Glucose-Capillary: 121 mg/dL — ABNORMAL HIGH (ref 70–99)

## 2021-10-31 MED ORDER — EMPAGLIFLOZIN 10 MG PO TABS
10.0000 mg | ORAL_TABLET | Freq: Every day | ORAL | 2 refills | Status: DC
  Filled 2021-10-31 – 2021-11-01 (×2): qty 30, 30d supply, fill #0
  Filled 2021-12-31 (×2): qty 30, 30d supply, fill #1
  Filled 2022-04-02: qty 30, 30d supply, fill #2

## 2021-10-31 MED ORDER — AMOXICILLIN-POT CLAVULANATE 875-125 MG PO TABS
1.0000 | ORAL_TABLET | Freq: Two times a day (BID) | ORAL | Status: DC
  Administered 2021-10-31: 1 via ORAL
  Filled 2021-10-31: qty 1

## 2021-10-31 MED ORDER — AMOXICILLIN-POT CLAVULANATE 875-125 MG PO TABS
1.0000 | ORAL_TABLET | Freq: Two times a day (BID) | ORAL | 0 refills | Status: DC
  Filled 2021-10-31: qty 28, 14d supply, fill #0

## 2021-10-31 MED ORDER — SODIUM CHLORIDE 0.9 % IV SOLN
3.0000 g | Freq: Four times a day (QID) | INTRAVENOUS | Status: DC
  Filled 2021-10-31: qty 8

## 2021-10-31 NOTE — Progress Notes (Signed)
Provided discharge education/instructions, all questions and concerns addressed. Pt not in acute distress, discharged home with belongings. 

## 2021-10-31 NOTE — Discharge Summary (Addendum)
Physician Discharge Summary   Patient: [name] MRN: [id number] DOB: 1980-10-03  Admit date:     10/25/2021  Discharge date: 10/31/21  Discharge Physician: Vernelle Emerald   PCP: Kerin Perna, NP   Recommendations at discharge:   Follow-up with your primary care provider [name] in 1 to 2 weeks Follow-up with orthopedic surgery, Dr. Kathaleen Bury in 1 week for a wound check Follow-up with the outpatient [facility] wound care clinic, a referral has been placed Please follow-up with Dr. Linus Salmons with infectious disease on 11/1 at 9:45 AM  Discharge Diagnoses: Principal Problem:   Diabetic foot infection ([place]) Active Problems:   Type 2 diabetes mellitus with hyperglycemia ([place])   Class 3 obesity ([place])   Hyperlipidemia   HTN (hypertension)   Deep vein thrombosis (DVT) of right lower extremity ([place])  Resolved Problems:   * No resolved hospital problems. 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      Patient was seen at wound care office yesterday by Dr. Kathaleen Bury and sent to the ED for further evaluation.  I patient was found to be hyperglycemic with clinical concern for diabetic right foot infection and osteomyelitis.  Patient was given vancomycin, cefepime, metronidazole.  During the hospitalization patient was initiated on Jardiance for improved glycemic control to be given in addition to patient's Trulicity and Lantus.   Upon admission orthopedics consulted.  Patient underwent irrigation and debridement of the right foot wound with cultures growing out actinomyces.  Dr. Linus Salmons with infectious disease was then consulted who recommended a 2-week course of Augmentin  followed by a 6 to 26-month course of oral amoxicillin with outpatient follow-up with him.  Patient clinically improved and was eventually discharged with outpatient follow-up with orthopedic surgery, infectious disease and wound care clinic.    Assessment and Plan: No notes have been filed under this hospital service. Service: Hospitalist       Pain control - [employer] Controlled Substance Reporting System database was reviewed. and patient was instructed, not to drive, operate heavy machinery, perform activities at heights, swimming or participation in water activities or provide baby-sitting services while on Pain, Sleep and Anxiety Medications; until their outpatient Physician has advised to do so again. Also recommended to not to take more than prescribed Pain, Sleep and Anxiety Medications.  Consultants: Dr. Kathaleen Bury with Orthopedic Surgery, Dr. Linus Salmons with infecious disease. Procedures performed: Intraoperative irrigation and debridement of right foot wound performed 10/27/2021 by Dr. Kathaleen Bury.  Disposition: Home Diet recommendation:  Discharge Diet Orders (From admission, onward)     Start     Ordered   10/31/21 0000  Diet - low sodium heart healthy        10/31/21 1546   10/31/21 0000  Diet Carb Modified        10/31/21 1546           Cardiac and Carb modified diet DISCHARGE MEDICATION: Allergies as of 10/31/2021   No Known Allergies      Medication List     STOP taking these medications    acetaminophen 325 MG tablet Commonly known as: TYLENOL   ondansetron 4 MG tablet Commonly known as: ZOFRAN       TAKE these medications  amoxicillin-clavulanate 875-125 MG tablet Commonly known as: AUGMENTIN Take 1 tablet by mouth every 12 (twelve) hours for 14 days.   apixaban 5 MG Tabs tablet Commonly known as: ELIQUIS Take 1 tablet ($RemoveB'5mg'TFAvElpV$ ) by mouth twice daily.   atorvastatin 80 MG tablet Commonly known as: LIPITOR Take 1 tablet (80 mg total) by  mouth once daily.   empagliflozin 10 MG Tabs tablet Commonly known as: JARDIANCE Take 1 tablet (10 mg total) by mouth daily. Start taking on: November 01, 2021   gabapentin 300 MG capsule Commonly known as: NEURONTIN Take 2 capsules by mouth 3 times daily.   gentamicin ointment 0.1 % Commonly known as: GARAMYCIN Apply 1 Application topically daily. Notes to patient: Resume home regimen   Lantus SoloStar 100 UNIT/ML Solostar Pen Generic drug: insulin glargine Inject 70 Units into the skin daily.   lisinopril 2.5 MG tablet Commonly known as: ZESTRIL Take 1 tablet (2.5 mg total) by mouth daily.   metFORMIN 500 MG 24 hr tablet Commonly known as: GLUCOPHAGE-XR Take 2 tablets (1,000 mg total) by mouth 2 (two) times daily.   oxyCODONE 5 MG immediate release tablet Commonly known as: Oxy IR/ROXICODONE Take 5 mg by mouth every 6 (six) hours as needed for moderate pain or severe pain. Notes to patient: Resume home regimen   TechLite Pen Needles 32G X 4 MM Misc Generic drug: Insulin Pen Needle use as directed   True Metrix Blood Glucose Test test strip Generic drug: glucose blood Use to check blood sugar twice a day.   True Metrix Meter w/Device Kit Use to check blood sugar twice a day.   TRUEplus Lancets 28G Misc Use to check blood sugar twice a day.   Trulicity 1.5 UV/2.5DG Sopn Generic drug: Dulaglutide Inject 1.5 mg into the skin once a week. Notes to patient: Resume home regimen               Discharge Care Instructions  (From admission, onward)           Start     Ordered   10/31/21 0000  Discharge wound care:       Comments: Pack wound with iodoform gauze daily.  Wrap with Kerlix and secure with tape   10/31/21 1546            Follow-up Information     Kerin Perna, NP Follow up in 1 week(s).   Specialty: Internal Medicine Why: Follow-up with primary care provider in 1 to 2 weeks Contact information: [place] [address] [phone]         Armond Hang, MD Follow up in 1 week(s).   Specialty: Orthopedic Surgery Why: Follow up in 1 week Contact information: [address], [address] [place] [address] [phone]         Thayer Headings, MD Follow up on 11/14/2021.   Specialty: Infectious Diseases Why: 9:45AM Contact information: [address] [address] [phone]                Discharge Exam: Danley Danker Weights   10/25/21 2339  Weight: (!) 141.5 kg   Constitutional: Awake alert and oriented x3, no associated distress.   Respiratory: clear to auscultation bilaterally, no wheezing, no crackles. Normal respiratory effort. No accessory muscle use.  Cardiovascular: Regular rate and rhythm, no murmurs / rubs / gallops. No extremity edema. 2+ pedal pulses. No carotid bruits.  Abdomen: Abdomen is soft and nontender.  No evidence of intra-abdominal masses.  Positive bowel sounds noted in all quadrants.   Musculoskeletal: Right foot wound is wrapped in dressing that is clean dry and intact.  Right foot is in a surgical boot.       Condition at discharge: fair  The results of significant diagnostics from this hospitalization (including imaging, microbiology, ancillary and laboratory) are listed below for reference.   Imaging Studies: VAS [place] LOWER EXTREMITY VENOUS (DVT)  Result Date: 10/29/2021  Lower Venous DVT Study Patient Name:  [name]  Date of Exam:   10/29/2021 Medical Rec #: [id number]       Accession #:    [id number] Date of Birth: 19-Sep-1980       Patient Gender: F Patient Age:   26 years Exam Location:  [facility] Procedure:      VAS [place] LOWER EXTREMITY VENOUS (DVT) Referring Phys: Jolaine Artist MEMON --------------------------------------------------------------------------------  Indications: F/U DVT.  Limitations: Body habitus and poor ultrasound/tissue interface. Comparison Study: 03/08/2021 - RIGHT:                   -  There is no obvious evidence of acute deep vein thrombosis                   in the lower                   extremity. Findings improved when compared to prior study.                    - No cystic structure found in the popliteal fossa.                    LEFT:                   - No evidence of common femoral vein obstruction.                    01/30/2021 - RIGHT:                   - Findings consistent with acute deep vein thrombosis                   involving the right                   common femoral vein, right femoral vein, and right popliteal                   vein.                   - No cystic structure found in the popliteal fossa.                    LEFT:                   - No evidence of deep vein thrombosis in the lower extremity.                   No indirect                   evidence of obstruction proximal to the inguinal ligament.                   - No cystic structure found in the popliteal fossa. Performing Technologist: Oliver Hum RVT  Examination Guidelines: A complete evaluation includes B-mode imaging, spectral Doppler, color  Doppler, and power Doppler as needed of all accessible portions of each vessel. Bilateral testing is considered an integral part of a complete examination. Limited examinations for reoccurring indications may be performed as noted. The reflux portion of the exam is performed with the patient in reverse Trendelenburg.  +---------+---------------+---------+-----------+----------+-------------------+ RIGHT    CompressibilityPhasicitySpontaneityPropertiesThrombus Aging      +---------+---------------+---------+-----------+----------+-------------------+ CFV      Full           Yes      Yes                                      +---------+---------------+---------+-----------+----------+-------------------+ SFJ      Full                                                              +---------+---------------+---------+-----------+----------+-------------------+ FV Prox  Full                                                             +---------+---------------+---------+-----------+----------+-------------------+ FV Mid                  Yes      Yes                                      +---------+---------------+---------+-----------+----------+-------------------+ FV Distal               Yes      Yes                                      +---------+---------------+---------+-----------+----------+-------------------+ PFV      Full                                                             +---------+---------------+---------+-----------+----------+-------------------+ POP      Full           Yes      Yes                                      +---------+---------------+---------+-----------+----------+-------------------+ PTV      Full                                                             +---------+---------------+---------+-----------+----------+-------------------+ PERO  Not well visualized +---------+---------------+---------+-----------+----------+-------------------+   +---------+---------------+---------+-----------+----------+-------------------+ LEFT     CompressibilityPhasicitySpontaneityPropertiesThrombus Aging      +---------+---------------+---------+-----------+----------+-------------------+ CFV      Full           Yes      Yes                                      +---------+---------------+---------+-----------+----------+-------------------+ SFJ      Full                                                             +---------+---------------+---------+-----------+----------+-------------------+ FV Prox  Full                                                             +---------+---------------+---------+-----------+----------+-------------------+ FV  Mid                  Yes      Yes                                      +---------+---------------+---------+-----------+----------+-------------------+ FV Distal               Yes      Yes                                      +---------+---------------+---------+-----------+----------+-------------------+ PFV      Full                                                             +---------+---------------+---------+-----------+----------+-------------------+ POP      Full           Yes      Yes                                      +---------+---------------+---------+-----------+----------+-------------------+ PTV      Full                                                             +---------+---------------+---------+-----------+----------+-------------------+ PERO                                                  Not well visualized +---------+---------------+---------+-----------+----------+-------------------+     Summary: RIGHT: -  There is no evidence of deep vein thrombosis in the lower extremity. However, portions of this examination were limited- see technologist comments above.  - No cystic structure found in the popliteal fossa.  LEFT: - There is no evidence of deep vein thrombosis in the lower extremity. However, portions of this examination were limited- see technologist comments above.  - No cystic structure found in the popliteal fossa.  *See table(s) above for measurements and observations. Electronically signed by Orlie Pollen on 10/29/2021 at 5:23:56 PM.    Final    MR FOOT RIGHT W WO CONTRAST  Result Date: 10/26/2021 CLINICAL DATA:  Medial foot wound post transmetatarsal amputation in 2022. EXAM: MRI OF THE RIGHT FOREFOOT WITHOUT AND WITH CONTRAST TECHNIQUE: Multiplanar, multisequence MR imaging of the right foot was performed before and after the administration of intravenous contrast. CONTRAST:  37mL GADAVIST GADOBUTROL 1 MMOL/ML IV SOLN COMPARISON:   CT 10/22/2021, radiographs 03/08/2021 and MRI 02/27/2021. FINDINGS: Bones/Joint/Cartilage Status post amputation through the metatarsal bases. Previously demonstrated bone marrow edema within the metatarsal stumps and medial cuneiform has nearly resolved. No progressive cortical destruction or suspicious marrow enhancement identified. The additional bones of the hindfoot and midfoot appear unremarkable. No significant joint effusions or abnormal synovial enhancement. Ligaments Intact Lisfranc ligament. Muscles and Tendons Mild T2 hyperintensity and enhancement within the plantar musculature, improved from the previous study. No significant tenosynovitis. Soft tissues As seen on recent CT, there is a soft tissue wound along the plantar medial aspect of the midfoot with associated skin thickening and surrounding soft tissue enhancement. Small rim enhancing fluid collection measuring up to 1.4 cm in diameter is unchanged from the recent CT and communicates with the wound. No new or enlarging fluid collections are identified. No evidence of foreign body. IMPRESSION: 1. Persistent soft tissue wound along the plantar medial aspect of the midfoot with a small rim enhancing fluid collection which communicates with the wound. Findings are unchanged from recent CT. 2. No evidence of osteomyelitis or septic joint. 3. Interval improvement in previously demonstrated bone marrow edema within the metatarsal stumps and medial cuneiform status post amputation through the metatarsal bases. Electronically Signed   By: Richardean Sale M.D.   On: 10/26/2021 08:25   CT FOOT RIGHT W CONTRAST  Result Date: 10/25/2021 CLINICAL DATA:  Medial foot wound. History of transmetatarsal amputation in 2022 EXAM: CT OF THE LOWER RIGHT EXTREMITY WITH CONTRAST TECHNIQUE: Multidetector CT imaging of the lower right extremity was performed according to the standard protocol following intravenous contrast administration. RADIATION DOSE REDUCTION:  This exam was performed according to the departmental dose-optimization program which includes automated exposure control, adjustment of the mA and/or kV according to patient size and/or use of iterative reconstruction technique. CONTRAST:  35mL OMNIPAQUE IOHEXOL 300 MG/ML  SOLN COMPARISON:  X-ray 03/08/2021.  MRI 02/27/2021 FINDINGS: Bones/Joint/Cartilage Postsurgical changes from prior transmetatarsal amputation of the first through fifth rays at the level of the metatarsal bases. Sclerosis and erosion is present at the resection margin of the residual fourth metatarsal base (series 6, image 55) sclerotic changes are also present at the fifth metatarsal base without definite erosion. The remaining residual metatarsal bases are intact without obvious osseous erosion. Bones are demineralized and diffusely heterogeneous, likely related to disuse demineralization. No acute fracture. No dislocation. Ligaments Suboptimally assessed by CT. Muscles and Tendons Post amputation changes of the musculotendinous structures. Diffuse muscle atrophy. Soft tissues Soft tissue wound/ulceration at the plantar-medial aspect of the distal stump. Marked skin thickening with extensive  fluid and edema within the soft tissues. There is a small irregular rim enhancing fluid pocket communicating to the overlying soft tissues via a enhancing sinus tract (series 7, images 124-132). Fluid pocket measures approximately 1.5 x 0.7 x 1.0 cm. No soft tissue gas. IMPRESSION: 1. Postsurgical changes from prior transmetatarsal amputation of the first through fifth rays at the level of the metatarsal bases. Erosion of the residual fourth metatarsal base suggestive of osteomyelitis, which may be acute on chronic. 2. Soft tissue wound/ulceration at the plantar-medial aspect of the distal stump with extensive surrounding cellulitis. There is a small irregular rim enhancing 1.5 cm fluid pocket communicating to the overlying soft tissues via an enhancing  sinus tract, compatible with a small abscess. Electronically Signed   By: Davina Poke D.O.   On: 10/25/2021 09:29    Microbiology: Results for orders placed or performed during the hospital encounter of 10/25/21  Blood culture (routine x 2)     Status: None   Collection Time: 10/26/21 12:41 AM   Specimen: BLOOD  Result Value Ref Range Status   Specimen Description   Final    BLOOD SITE NOT SPECIFIED Performed at [facility] [address] [place], [place] [address]    Special Requests   Final    BOTTLES DRAWN AEROBIC AND ANAEROBIC Blood Culture adequate volume Performed at [facility] [address], [place], [place] [address]    Culture   Final    NO GROWTH 5 DAYS Performed at [facility], [place] [address], [place], [place] [address]    Report Status 10/31/2021 FINAL  Final  Blood culture (routine x 2)     Status: None   Collection Time: 10/26/21 12:53 AM   Specimen: BLOOD  Result Value Ref Range Status   Specimen Description   Final    BLOOD SITE NOT SPECIFIED Performed at [facility] [address], [place], [place] [address]    Special Requests   Final    BOTTLES DRAWN AEROBIC AND ANAEROBIC Blood Culture adequate volume Performed at [facility] [address] [place], [place] [address]    Culture   Final    NO GROWTH 5 DAYS Performed at [facility], [place] [address] [place], [place] [address]    Report Status 10/31/2021 FINAL  Final  Surgical pcr screen     Status: None   Collection Time: 10/27/21  8:20 AM   Specimen: Nasal Mucosa; Nasal Swab  Result Value Ref Range Status   MRSA, PCR NEGATIVE NEGATIVE Final   Staphylococcus aureus NEGATIVE NEGATIVE Final    Comment: (NOTE) The Xpert SA Assay (FDA approved for NASAL specimens in patients 89 years of age and older), is one component of a comprehensive surveillance program. It is not intended to diagnose infection nor  to guide or monitor treatment. Performed at [facility], [place] [address] [place], [place] [address]   Aerobic/Anaerobic Culture w Gram Stain (surgical/deep wound)     Status: None (Preliminary result)   Collection Time: 10/27/21  2:11 PM   Specimen: PATH Other; Tissue  Result Value Ref Range Status   Specimen Description TISSUE RIGHT FOOT  Final   Special Requests ID   D  Final   Gram Stain   Final    NO WBC SEEN RARE GRAM POSITIVE COCCI IN SINGLES Performed at [facility], [place] [address], [place], [place] [address]    Culture  Final    RARE ACTINOMYCES NEUII Standardized susceptibility testing for this organism is not available. NO ANAEROBES ISOLATED; CULTURE IN PROGRESS FOR 5 DAYS    Report Status PENDING  Incomplete  Aerobic/Anaerobic Culture w Gram Stain (surgical/deep wound)     Status: None (Preliminary result)   Collection Time: 10/27/21  2:13 PM   Specimen: PATH Other; Tissue  Result Value Ref Range Status   Specimen Description ABSCESS RIGHT FOOT  Final   Special Requests ID A  Final   Gram Stain   Final    NO WBC SEEN RARE GRAM POSITIVE COCCI IN PAIRS Performed at [facility], [address] [place], [place] [address]    Culture   Final    RARE ACTINOMYCES NEUII Standardized susceptibility testing for this organism is not available. NO ANAEROBES ISOLATED; CULTURE IN PROGRESS FOR 5 DAYS    Report Status PENDING  Incomplete  Aerobic/Anaerobic Culture w Gram Stain (surgical/deep wound)     Status: None (Preliminary result)   Collection Time: 10/27/21  2:14 PM   Specimen: PATH Soft tissue  Result Value Ref Range Status   Specimen Description ABSCESS RIGHT FOOT  Final   Special Requests ID B  Final   Gram Stain   Final    NO WBC SEEN RARE GRAM POSITIVE COCCI IN SINGLES IN PAIRS Performed at [facility], [place] [address] [place], [place] [address]    Culture   Final    RARE ACTINOMYCES NEUII Standardized susceptibility  testing for this organism is not available. NO ANAEROBES ISOLATED; CULTURE IN PROGRESS FOR 5 DAYS    Report Status PENDING  Incomplete  Aerobic/Anaerobic Culture w Gram Stain (surgical/deep wound)     Status: None (Preliminary result)   Collection Time: 10/27/21  2:15 PM   Specimen: PATH Soft tissue  Result Value Ref Range Status   Specimen Description TISSUE RIGHT FOOT  Final   Special Requests ID C  Final   Gram Stain   Final    NO ORGANISMS SEEN NO WBC SEEN Performed at [facility], [place] [address], [place], [place] [address]    Culture   Final    FEW ACTINOMYCES NEUII Standardized susceptibility testing for this organism is not available. NO ANAEROBES ISOLATED; CULTURE IN PROGRESS FOR 5 DAYS    Report Status PENDING  Incomplete    Labs: CBC: Recent Labs  Lab 10/26/21 0041 10/28/21 0332 10/29/21 0316 10/31/21 0335  WBC 9.4 10.8* 10.9* 11.3*  NEUTROABS 5.9  --   --   --   HGB 12.1 12.3 11.6* 12.4  HCT 37.4 38.8 36.6 38.6  MCV 83.9 82.7 84.1 83.9  PLT 247 281 274 491   Basic Metabolic Panel: Recent Labs  Lab 10/26/21 0041 10/28/21 0332 10/29/21 0316 10/31/21 0335  NA 134* 132* 135 136  K 4.0 4.3 4.2 4.0  CL 101 101 103 101  CO2 22 21* 23 26  GLUCOSE 411* 336* 343* 142*  BUN 12 19 25* 30*  CREATININE 0.80 0.88 0.81 0.83  CALCIUM 8.8* 8.9 8.6* 9.5   Liver Function Tests: Recent Labs  Lab 10/26/21 0041  AST 17  ALT 7  ALKPHOS 111  BILITOT 0.4  PROT 7.0  ALBUMIN 3.2*   CBG: Recent Labs  Lab 10/30/21 1652 10/30/21 2136 10/31/21 0736 10/31/21 1137 10/31/21 1632  GLUCAP 152* 173* 143* 139* 121*    Discharge time spent: greater than 30 minutes.  Signed: Vernelle Emerald, MD [facility]  10/31/2021 

## 2021-10-31 NOTE — Progress Notes (Signed)
Physical Therapy Treatment Patient Details Name: [name] MRN: [id number] DOB: 1980/02/23 Today's Date: 10/31/2021   History of Present Illness 41 yo female sent to ED from wound care office. CT and MRI confirmed new abscess at the residual limb site R TMA. ortho consulted and pt underwent R foot I & D on 10/27/21.  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 Transferred to w/c in preparation for wash up in bathroom. RN aware. Pt is independent with w/c mobility.   Recommendations for follow up therapy are one component of a multi-disciplinary discharge planning process, led by the attending physician.  Recommendations may be updated based on patient status, additional functional criteria and insurance authorization.  Follow Up Recommendations  No PT follow up     Assistance Recommended at Discharge Intermittent Supervision/Assistance  Patient can return home with the following A little help with walking and/or transfers;Assistance with cooking/housework;Assist for transportation;Help with stairs or ramp for entrance   Equipment Recommendations  None recommended by PT    Recommendations for Other Services       Precautions / Restrictions Precautions Precautions: Fall Restrictions Weight Bearing Restrictions: Yes RLE Weight Bearing: Touchdown weight bearing Other Position/Activity Restrictions: through heel     Mobility  Bed Mobility               General bed mobility comments: in recliner after OT session    Transfers Overall transfer level: Needs assistance Equipment used: Rolling walker (2 wheels) Transfers: Sit to/from Stand, Bed to  chair/wheelchair/BSC Sit to Stand: Min guard Stand pivot transfers: Min guard         General transfer comment: cues for RLE position, RW and TDWB/NWB. pt with good adherence to [facility] restrictions    Ambulation/Gait                   Stairs             Wheelchair Mobility    Modified Rankin (Stroke Patients Only)       Balance             Standing balance-Leahy Scale: Poor Standing balance comment: pt was able to comply with TDWB to NWB status this date                            Cognition Arousal/Alertness: Awake/alert Behavior During Therapy: WFL for tasks assessed/performed Overall Cognitive Status: Within Functional Limits for tasks assessed                                          Exercises Other Exercises Other Exercises: repeated STS for strengthening and activity tol    General Comments        Pertinent Vitals/Pain Pain Assessment Pain Assessment: No/denies pain    Home Living                          Prior Function            PT Goals (current goals can now be found in the  care plan section) Acute Rehab PT Goals Patient Stated Goal: foot healed PT Goal Formulation: With patient Time For Goal Achievement: 11/12/21 Potential to Achieve Goals: Good Progress towards PT goals: Progressing toward goals    Frequency    Min 5X/week      PT Plan Current plan remains appropriate    Co-evaluation              AM-PAC PT "6 Clicks" Mobility   Outcome Measure  Help needed turning from your back to your side while in a flat bed without using bedrails?: None Help needed moving from lying on your back to sitting on the side of a flat bed without using bedrails?: A Little Help needed moving to and from a bed to a chair (including a wheelchair)?: A Little Help needed standing up from a chair using your arms (e.g., wheelchair or bedside chair)?: A Little Help needed to walk in hospital  room?: A Lot Help needed climbing 3-5 steps with a railing? : Total 6 Click Score: 16    End of Session Equipment Utilized During Treatment: Gait belt Activity Tolerance: Patient tolerated treatment well Patient left: Other (comment);with call bell/phone within reach Nurse Communication: Mobility status PT Visit Diagnosis: Other abnormalities of gait and mobility (R26.89);Difficulty in walking, not elsewhere classified (R26.2)     Time: 9935-7017 PT Time Calculation (min) (ACUTE ONLY): 25 min  Charges:  $Therapeutic Activity: 23-37 mins                     Baxter Flattery, PT  Acute Rehab Dept [facility]) [phone]  [facility] Weekend Pager [facility] only)  [phone]  10/31/2021    [facility] 10/31/2021, 11:35 AM

## 2021-10-31 NOTE — Plan of Care (Signed)

## 2021-10-31 NOTE — Consult Note (Signed)
[facility] for Infectious Disease       Reason for Consult:Actinomyces infection     Referring Physician: Dr. Cyd Silence  Principal Problem:   Diabetic foot infection ([place]) Active Problems:   Type 2 diabetes mellitus with hyperglycemia (HCC)   Class 3 obesity (HCC)   Hyperlipidemia   HTN (hypertension)   Deep vein thrombosis (DVT) of right lower extremity (HCC)    apixaban  5 mg Oral BID   atorvastatin  80 mg Oral Daily   empagliflozin  10 mg Oral Daily   gabapentin  600 mg Oral TID   insulin aspart  0-20 Units Subcutaneous TID WC   insulin aspart  0-5 Units Subcutaneous QHS   insulin glargine-yfgn  70 Units Subcutaneous Daily   lisinopril  2.5 mg Oral Daily   metFORMIN  1,000 mg Oral BID WC   Ensure Max Protein  11 oz Oral BID    Recommendations: Augmentin for 2 weeks Followed by 6-12 months of oral amoxicillin Follow up with me in 2 weeks - 11/1 9:45 am  Assessment: She developed an abscess at the site of the previous TMA and 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  I recommend she continue with broad coverage with Augmentin at discharge for 2 weeks then transition to oral amoxicillin for a prolonged, 6-12 month course.  I discussed this with her and the nature of an actinomyces infection.    Antibiotics: Ampicillin/sulbactam  HPI: [name] is a 41 y.o. female with a history of type 2 diabetes with a recent Hgb A1c of 8.8 who came in with a right foot abscess at the site of the TMA stump.  She is s/p right TMA in December 2022 then revision in February 2023.  Over the last week prior to presentation, she noted pain and CT then MRI with fluid colllection.  She underwent I and D by Dr. Kathaleen Bury on 10/14 and now culture with Actinomyces.     Review of Systems:  Constitutional: negative for fevers and chills All other systems reviewed and are negative    Past Medical History:  Diagnosis Date   Class 3 obesity ([place]) 12/16/2020   Diabetes mellitus     Social  History   Tobacco Use   Smoking status: Former    Types: Cigarettes  Scientific laboratory technician Use: Never used  Substance Use Topics   Alcohol use: Yes    Comment: occ   Drug use: Yes    Types: Marijuana    Comment: occasionally    Family History  Problem Relation Age of Onset   Hypertension Mother    Diabetes Mother    Diabetes Other     No Known Allergies  Physical Exam: Constitutional: in no apparent distress  Vitals:   10/31/21 0533 10/31/21 0856  BP: 115/69 119/70  Pulse: 72 75  Resp: 17 15  Temp: 98.1 F (36.7 C) 98 F (36.7 C)  SpO2: 98% 97%   EYES: anicteric Respiratory: normal respiratory effort Musculoskeletal: foot wrapped Skin: no rash  Lab Results  Component Value Date   WBC 11.3 (H) 10/31/2021   HGB 12.4 10/31/2021   HCT 38.6 10/31/2021   MCV 83.9 10/31/2021   PLT 295 10/31/2021    Lab Results  Component Value Date   CREATININE 0.83 10/31/2021   BUN 30 (H) 10/31/2021   NA 136 10/31/2021   K 4.0 10/31/2021   CL 101 10/31/2021   CO2 26 10/31/2021    Lab Results  Component Value Date   ALT 7 10/26/2021   AST 17 10/26/2021   ALKPHOS 111 10/26/2021     Microbiology: Recent Results (from the past 240 hour(s))  Blood culture (routine x 2)     Status: None   Collection Time: 10/26/21 12:41 AM   Specimen: BLOOD  Result Value Ref Range Status   Specimen Description   Final    BLOOD SITE NOT SPECIFIED Performed at [facility] [address] [place], [place] [address]    Special Requests   Final    BOTTLES DRAWN AEROBIC AND ANAEROBIC Blood Culture adequate volume Performed at [facility] [address] [place], [place], [place] [address]    Culture   Final    NO GROWTH 5 DAYS Performed at [facility], [place] [address], [place], [place] [address]    Report Status 10/31/2021 FINAL  Final  Blood culture (routine x 2)     Status: None   Collection Time: 10/26/21 12:53 AM   Specimen: BLOOD  Result  Value Ref Range Status   Specimen Description   Final    BLOOD SITE NOT SPECIFIED Performed at [facility] [address], [place], [place] [address]    Special Requests   Final    BOTTLES DRAWN AEROBIC AND ANAEROBIC Blood Culture adequate volume Performed at [facility] [address], [place], [place] [address]    Culture   Final    NO GROWTH 5 DAYS Performed at [facility], [place] [address], [place], [place] [address]    Report Status 10/31/2021 FINAL  Final  Surgical pcr screen     Status: None   Collection Time: 10/27/21  8:20 AM   Specimen: Nasal Mucosa; Nasal Swab  Result Value Ref Range Status   MRSA, PCR NEGATIVE NEGATIVE Final   Staphylococcus aureus NEGATIVE NEGATIVE Final    Comment: (NOTE) The Xpert SA Assay (FDA approved for NASAL specimens in patients 18 years of age and older), is one component of a comprehensive surveillance program. It is not intended to diagnose infection nor to guide or monitor treatment. Performed at [facility], [place] [address] [place], [place] [address]   Aerobic/Anaerobic Culture w Gram Stain (surgical/deep wound)     Status: None (Preliminary result)   Collection Time: 10/27/21  2:11 PM   Specimen: PATH Other; Tissue  Result Value Ref Range Status   Specimen Description TISSUE RIGHT FOOT  Final   Special Requests ID   D  Final   Gram Stain   Final    NO WBC SEEN RARE GRAM POSITIVE COCCI IN SINGLES Performed at [facility], [place] [address] [place], [place] [address]    Culture   Final    CULTURE REINCUBATED FOR BETTER GROWTH NO ANAEROBES ISOLATED; CULTURE IN PROGRESS FOR 5 DAYS    Report Status PENDING  Incomplete  Aerobic/Anaerobic Culture w Gram Stain (surgical/deep wound)     Status: None (Preliminary result)   Collection Time: 10/27/21  2:13 PM   Specimen: PATH Other; Tissue  Result Value Ref Range Status   Specimen Description ABSCESS RIGHT FOOT   Final   Special Requests ID A  Final   Gram Stain   Final    NO WBC SEEN RARE GRAM POSITIVE COCCI IN PAIRS Performed at [facility], [place] [address] [place], [place] [address]    Culture   Final    CULTURE REINCUBATED  FOR BETTER GROWTH NO ANAEROBES ISOLATED; CULTURE IN PROGRESS FOR 5 DAYS    Report Status PENDING  Incomplete  Aerobic/Anaerobic Culture w Gram Stain (surgical/deep wound)     Status: None (Preliminary result)   Collection Time: 10/27/21  2:14 PM   Specimen: PATH Soft tissue  Result Value Ref Range Status   Specimen Description ABSCESS RIGHT FOOT  Final   Special Requests ID B  Final   Gram Stain   Final    NO WBC SEEN RARE GRAM POSITIVE COCCI IN SINGLES IN PAIRS Performed at [facility], [place] [address] [place], [place] [address]    Culture   Final    CULTURE REINCUBATED FOR BETTER GROWTH NO ANAEROBES ISOLATED; CULTURE IN PROGRESS FOR 5 DAYS    Report Status PENDING  Incomplete  Aerobic/Anaerobic Culture w Gram Stain (surgical/deep wound)     Status: None (Preliminary result)   Collection Time: 10/27/21  2:15 PM   Specimen: PATH Soft tissue  Result Value Ref Range Status   Specimen Description TISSUE RIGHT FOOT  Final   Special Requests ID C  Final   Gram Stain   Final    NO ORGANISMS SEEN NO WBC SEEN Performed at [facility], [place] [address], [place], [place] [address]    Culture   Final    FEW ACTINOMYCES NEUII Standardized susceptibility testing for this organism is not available. NO ANAEROBES ISOLATED; CULTURE IN PROGRESS FOR 5 DAYS    Report Status PENDING  Incomplete    Thayer Headings, [facility] for Infectious Disease [email or web address] 10/31/2021, 10:17 AM

## 2021-10-31 NOTE — Hospital Course (Addendum)
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      Patient was seen at wound care office yesterday by Dr. Kathaleen Bury and sent to the ED for further evaluation.  I patient was found to be hyperglycemic with clinical concern for diabetic right foot infection and osteomyelitis.  Patient was given vancomycin, cefepime, metronidazole.  During the hospitalization patient was initiated on Jardiance for improved glycemic control to be given in addition to patient's Trulicity and Lantus.   Upon admission orthopedics consulted.  Patient underwent irrigation and debridement of the right foot wound with cultures growing out actinomyces.  Dr. Linus Salmons with infectious disease was then consulted who recommended a 2-week course of Augmentin followed by a 6 to 32-month course of oral amoxicillin with outpatient follow-up with him.  Patient clinically improved and was eventually discharged with outpatient follow-up with orthopedic surgery, infectious disease and wound care clinic.

## 2021-10-31 NOTE — Progress Notes (Signed)
  Transition of Care [facility]) Screening Note   Patient Details  Name: [name] Date of Birth: Jul 14, 1980   Transition of Care [facility]) CM/SW Contact:    Lennart Pall, LCSW Phone Number: 10/31/2021, 1:09 PM    Transition of Care Department [facility]) has reviewed patient and no TOC needs have been identified at this time. We will continue to monitor patient advancement through interdisciplinary progression rounds. If new patient transition needs arise, please place a TOC consult.

## 2021-10-31 NOTE — Progress Notes (Signed)
Occupational Therapy Treatment Patient Details Name: [name] MRN: [id number] DOB: 09-12-1980 Today's Date: 10/31/2021   History of present illness 41 yo female sent to ED from wound care office. CT and MRI confirmed new abscess at the residual limb site R TMA. ortho consulted and pt underwent R foot I & D on 10/27/21.  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 Patient was educated on importance of movement of LLE to reposition each movement to prevent torque on knee. Patient verbalized and demonstrated understanding. Patients session was limited with arrival of breakfast tray at this time. Patient would continue to benefit from skilled OT services at this time while admitted and after d/c to address noted deficits in order to improve overall safety and independence in ADLs.     Recommendations for follow up therapy are one component of a multi-disciplinary discharge planning process, led by the attending physician.  Recommendations may be updated based on patient status, additional functional criteria and insurance authorization.    Follow Up Recommendations  Home health OT    Assistance Recommended at Discharge Frequent or constant Supervision/Assistance  Patient can return home with the following  A little help with walking and/or transfers;A little help with bathing/dressing/bathroom;Assistance with cooking/housework;Direct supervision/assist for financial management;Assist for transportation;Help with stairs or ramp for entrance;Direct  supervision/assist for medications management   Equipment Recommendations       Recommendations for Other Services      Precautions / Restrictions Precautions Precautions: Fall Restrictions Weight Bearing Restrictions: Yes RLE Weight Bearing: Touchdown weight bearing Other Position/Activity Restrictions: through heel       Mobility Bed Mobility                    Transfers                         Balance                                           ADL either performed or assessed with clinical judgement   ADL Overall ADL's : Needs assistance/impaired                                       General ADL Comments: patient participated in scoot transfers to and from bed to recliner in room x3 with education on importance of not twisting on knee and small scoots v.s. squat piovt without moving LLE. patient verbalized understanding. patient was able to complete scooot transfers and maintain WB status with scoot transfers with supervision and increased time. patient session was limited with arrival of breakfast at this time. patient was educated on scoot trasnfers to drop arm BSC and the recommendation for one. patient verbalized understanding.    Extremity/Trunk Assessment              Vision       Perception  Praxis      Cognition Arousal/Alertness: Awake/alert Behavior During Therapy: WFL for tasks assessed/performed Overall Cognitive Status: Within Functional Limits for tasks assessed                                          Exercises      Shoulder Instructions       General Comments      Pertinent Vitals/ Pain       Pain Assessment Pain Assessment: 0-10 Pain Score: 2  Pain Location: R foot Pain Descriptors / Indicators: Discomfort Pain Intervention(s): Limited activity within patient's tolerance, Monitored during session  Home Living                                           Prior Functioning/Environment              Frequency  Min 2X/week        Progress Toward Goals  OT Goals(current goals can now be found in the care plan section)  Progress towards OT goals: Progressing toward goals     Plan Discharge plan remains appropriate    Co-evaluation                 AM-PAC OT "6 Clicks" Daily Activity     Outcome Measure   Help from another person eating meals?: None Help from another person taking care of personal grooming?: A Little Help from another person toileting, which includes using toliet, bedpan, or urinal?: A Little Help from another person bathing (including washing, rinsing, drying)?: A Lot Help from another person to put on and taking off regular upper body clothing?: A Little Help from another person to put on and taking off regular lower body clothing?: A Lot 6 Click Score: 17    End of Session    OT Visit Diagnosis: Unsteadiness on feet (R26.81);Other abnormalities of gait and mobility (R26.89);Muscle weakness (generalized) (M62.81);Pain   Activity Tolerance Patient tolerated treatment well   Patient Left in chair;with call bell/phone within reach   Nurse Communication Other (comment) (ok to participate in session)        Time: 8921-1941 OT Time Calculation (min): 13 min  Charges: OT Treatments $Self Care/Home Management : 8-22 mins  Rennie Plowman, MS Acute Rehabilitation Department Office# [phone]   [name] 10/31/2021, 10:40 AM

## 2021-11-01 ENCOUNTER — Other Ambulatory Visit: Payer: Self-pay

## 2021-11-01 ENCOUNTER — Other Ambulatory Visit (HOSPITAL_COMMUNITY): Payer: Self-pay

## 2021-11-01 ENCOUNTER — Telehealth: Payer: Self-pay

## 2021-11-01 LAB — AEROBIC/ANAEROBIC CULTURE W GRAM STAIN (SURGICAL/DEEP WOUND)
Gram Stain: NONE SEEN
Gram Stain: NONE SEEN
Gram Stain: NONE SEEN
Gram Stain: NONE SEEN

## 2021-11-01 NOTE — Telephone Encounter (Signed)
Transition Care Management Follow-up Telephone Call Date of discharge and from where: 10/31/2021, [facility] How have you been since you were released from the hospital? She said she is feeling okay, no complaints at this time  Any questions or concerns? No  Items Reviewed: Did the pt receive and understand the discharge instructions provided? Yes - but she needs to review them again  Medications obtained and verified?  She has most of her medications and her mother is picking up the new medications  and some refills today.  She has a glucometer.  Other? No  Any new allergies since your discharge? No  Dietary orders reviewed? No Do you have support at home? Yes   Home Care and Equipment/Supplies: Were home health services ordered? no If so, what is the name of the agency? N/a  Has the agency set up a time to come to the patient's home? not applicable Were any new equipment or medical supplies ordered?  Yes: wound care dressing supplies What is the name of the medical supply agency? She received them from the hospital Were you able to get the supplies/equipment? Yes- but she still needs more and her insurance has not covered the supplies.  Do you have any questions related to the use of the equipment or supplies? No  Functional Questionnaire: (I = Independent and D = Dependent) ADLs: has walker to use with transfers, wheelchair for mobility.  She also has a BSC and explained that she positions the [facility] and wheelchair to allow her maximum independence with transfers. Mother provides assistance with ADLs when needed. The wound center changes her dressing once/week and her mother changes it  the other 6 days/ week.    Follow up appointments reviewed:  PCP Hospital f/u appt confirmed? Yes  Scheduled to see Juluis Mire, NP - 11/26/2021.  [facility] f/u appt confirmed? Yes  Scheduled to see wound care- 11/06/2021, Acquanetta Belling [place], [facility] - 11/12/2021, [facility]-  Are  transportation arrangements needed? No - she uses [facility] transportation when he mother is not able to take her  If their condition worsens, is the pt aware to call PCP or go to the Emergency Dept.? Yes Was the patient provided with contact information for the PCP's office or ED? Yes Was to pt encouraged to call back with questions or concerns? Yes

## 2021-11-06 ENCOUNTER — Encounter (HOSPITAL_BASED_OUTPATIENT_CLINIC_OR_DEPARTMENT_OTHER): Payer: Medicaid Other | Admitting: Internal Medicine

## 2021-11-09 ENCOUNTER — Other Ambulatory Visit: Payer: Self-pay

## 2021-11-12 ENCOUNTER — Encounter (HOSPITAL_BASED_OUTPATIENT_CLINIC_OR_DEPARTMENT_OTHER): Payer: Medicaid Other | Admitting: Internal Medicine

## 2021-11-12 ENCOUNTER — Ambulatory Visit: Payer: Medicaid Other | Attending: Pharmacist | Admitting: Pharmacist

## 2021-11-12 DIAGNOSIS — L97514 Non-pressure chronic ulcer of other part of right foot with necrosis of bone: Secondary | ICD-10-CM | POA: Diagnosis not present

## 2021-11-12 DIAGNOSIS — I10 Essential (primary) hypertension: Secondary | ICD-10-CM | POA: Diagnosis not present

## 2021-11-12 DIAGNOSIS — Z794 Long term (current) use of insulin: Secondary | ICD-10-CM | POA: Diagnosis not present

## 2021-11-12 DIAGNOSIS — M869 Osteomyelitis, unspecified: Secondary | ICD-10-CM | POA: Diagnosis not present

## 2021-11-12 DIAGNOSIS — Z7901 Long term (current) use of anticoagulants: Secondary | ICD-10-CM | POA: Diagnosis not present

## 2021-11-12 DIAGNOSIS — E11621 Type 2 diabetes mellitus with foot ulcer: Secondary | ICD-10-CM

## 2021-11-12 DIAGNOSIS — E1140 Type 2 diabetes mellitus with diabetic neuropathy, unspecified: Secondary | ICD-10-CM | POA: Diagnosis not present

## 2021-11-12 DIAGNOSIS — Z89431 Acquired absence of right foot: Secondary | ICD-10-CM | POA: Diagnosis not present

## 2021-11-14 ENCOUNTER — Encounter: Payer: Self-pay | Admitting: Internal Medicine

## 2021-11-14 ENCOUNTER — Ambulatory Visit (INDEPENDENT_AMBULATORY_CARE_PROVIDER_SITE_OTHER): Payer: Medicaid Other | Admitting: Internal Medicine

## 2021-11-14 ENCOUNTER — Other Ambulatory Visit: Payer: Self-pay

## 2021-11-14 DIAGNOSIS — A429 Actinomycosis, unspecified: Secondary | ICD-10-CM | POA: Diagnosis not present

## 2021-11-14 DIAGNOSIS — E11628 Type 2 diabetes mellitus with other skin complications: Secondary | ICD-10-CM | POA: Diagnosis present

## 2021-11-14 DIAGNOSIS — L089 Local infection of the skin and subcutaneous tissue, unspecified: Secondary | ICD-10-CM | POA: Diagnosis not present

## 2021-11-14 DIAGNOSIS — Z5181 Encounter for therapeutic drug level monitoring: Secondary | ICD-10-CM

## 2021-11-14 MED ORDER — AMOXICILLIN 500 MG PO CAPS
500.0000 mg | ORAL_CAPSULE | Freq: Three times a day (TID) | ORAL | 5 refills | Status: DC
  Filled 2021-11-14: qty 21, 7d supply, fill #0
  Filled 2021-11-14: qty 69, 23d supply, fill #0

## 2021-11-14 NOTE — Assessment & Plan Note (Signed)
I discussed the nature of Actinomyces infection and the need for prolonged treatment.  I will have her continue with oral amoxicillin for 6 or more months.  She will rtc in 3 months, sooner if indicated.

## 2021-11-14 NOTE — Assessment & Plan Note (Signed)
She is doing well from the foot standpoint and no pus, healing.  At this point, she has completed treatment for the soft tissue abscess and will narrow treatment to Actinomyces alone.

## 2021-11-14 NOTE — Progress Notes (Signed)
   Subjective:    Patient ID: [name], female    DOB: [birth year], 41 y.o.   MRN: [id number]  HPI Here for follow up of Actinomyces infection.   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  Her foot is healing well, no pus and followed by wound care.  No associated rash or diarrhea.     Review of Systems  Constitutional:  Negative for fatigue and fever.  Gastrointestinal:  Negative for diarrhea.  Skin:  Negative for rash.       Objective:   Physical Exam Eyes:     General: No scleral icterus. Pulmonary:     Effort: Pulmonary effort is normal.  Neurological:     General: No focal deficit present.     Mental Status: She is alert.   SH: no tobacco        Assessment & Plan:

## 2021-11-14 NOTE — Assessment & Plan Note (Signed)
She is doing well on antibiotics with no reported side effects.  Will continue to monitor.

## 2021-11-16 NOTE — Progress Notes (Signed)
MURLINE, WEIGEL ([id number]) [id number] Page 1 of 9 Visit Report for 11/12/2021 Chief Complaint Document Details Patient Name: Date of Service: [name], [place] [name]. 11/12/2021 2:45 PM Medical Record Number: [id number] Patient Account Number: [id number] Date of Birth/Sex: Treating RN: 1980/11/01 (41 y.o. F) Primary Care Provider: Gwinda Passe Other Clinician: Referring Provider: Treating Provider/Extender: Grace Isaac in Treatment: 42 Information Obtained from: Patient Chief Complaint Osteomyelitis of the right foot status post transmetatarsal amputation with surgical site dehiscence Electronic Signature(s) Signed: 11/16/2021 1:40:10 PM By: Geralyn Corwin DO Entered By: Geralyn Corwin on 11/12/2021 15:17:09 -------------------------------------------------------------------------------- HPI Details Patient Name: Date of Service: [name], [name] [name]. 11/12/2021 2:45 PM Medical Record Number: [id number] Patient Account Number: [id number] Date of Birth/Sex: Treating RN: 11-09-1980 (41 y.o. F) Primary Care Provider: Gwinda Passe Other Clinician: Referring Provider: Treating Provider/Extender: Grace Isaac in Treatment: 42 History of Present Illness HPI Description: Admission 01/22/2021 [name] a 41 year old female with a past medical history of insulin-dependent uncontrolled type 2 diabetes with last hemoglobin A1c of 13.5, osteomyelitis of the right foot status post transmetatarsal amputation on 12/18/2020 that presents to the clinic for right foot wound. She has had dehiscence of the surgical site. [name] using wet-to-dry dressings. She has a PICC line and receiving IV ceftriaxone daily for her osteomyelitis. There [name] an end date of 01/27/2021. [name] also taking oral metronidazole. She currently denies systemic signs of infection. 1/19; patient presents for follow-up. She  was diagnosed with a DVT to the right lower extremity 2 days ago. [name] on Eliquis now. [name] scheduled to see her infectious disease doctor tomorrow. She has been using Dakin's wet-to-dry dressings. She denies systemic signs of infection. 1/26; patient presents for follow-up. She saw infectious disease on 1/21 started on Augmentin. Her PICC line and IV ceftriaxone was discontinued. Patient reports stability to her wound. She has been using Dakin's wet-to-dry dressings. She currently denies systemic signs of infection. 2/3; patient presents for follow-up. She continues to use Dakin's wet-to-dry dressings to the wound bed. She saw Dr. Manson Passey with infectious disease yesterday and [name] continuing Augmentin. T entative end date [name] 2/16. Patient reports following up with orthopedics. She states there [name] no further plan from them. She currently denies systemic signs of infection. 2/10; patient presents for follow-up. She continues to use Dakin's wet to dry dressings. [name] scheduled to have her MRI done on 2/14. She states that she had pain to the debridement site from last clinic visit and declines debridement today. She denies systemic signs of infection. She continues to have yellow thick drainage. 2/20; patient presents for follow-up. She continues to use Dakin's wet-to-dry dressings. She obtained her MRI. The results showed an abscess and [name] scheduled to see her orthopedic surgeon on 2/23. She saw infectious disease 2/17 and her antibiotics were extended. She currently denies systemic signs of infection. 3/6; patient presents for follow-up. She had debridement and irrigation of her foot on 03/10/2021 due to abscess noted on MRI. She was started on IV ceftriaxone and oral Flagyl. She has no issues or complaints today. She has been using iodoform packing to the tunnel and Dakin's wet-to-dry to the opening. 03/26/2021: She continues on IV ceftriaxone and oral metronidazole. She has follow-up with  infectious disease tomorrow. No significant issues or complaints today. Her mother continues to help her with her wound dressing, using iodoform packing strips into the tunnel and Dakin's to  the open portion of the wound. 3/20; patient presents for follow-up. She continues to be on IV ceftriaxone in oral metronidazole. She has been using iodoform to the tunnel and Dakin's wet-toTAMEY, WANEK ([id number]) [id number] Page 2 of 9 dry to the open wound. She denies signs of infection. 3/27; patient presents for follow-up. She no longer has a PICC line. She has been using iodoform to the tunnel and Dakin's wet-to-dry to the open wound. She reports improvement in wound healing. She denies signs of infection. 4/3; patient presents for follow-up. She states she has been using Hydrofera Blue to the open wound and iodoform packing to the tunnel without any issues. She denies signs of infection. 4/18; patient presents for follow-up. She saw infectious disease on 4/11. She has finished her oral antibiotics and completed a total of 6 weeks of antibiotics (this includes IV as well). No further antibiotics needed. She has been using Hydrofera Blue and iodoform packing. She states that the tunneled area has come in and the iodoform [name] not staying in place anymore. She has no issues or complaints today. She denies signs of infection. 4/24; patient presents for follow-up. She saw Dr. Carlene Coria, plastic surgery to discuss potential skin graft/substitute placement. At this time he thinks that the skin graft would likely not take. He [name] in agreement with trying a wound VAC. Patient has been using Hydrofera Blue dressing changes with no issues. She denies signs of infection. She reports improvement in wound healing. 5/1; patient presents for follow-up. Unfortunately patient did not have insurance when we ran [place] the [place]. There [name] an assistance program and we are trying to get this  accommodated for the patient. In the meantime she has been using Hydrofera Blue without any issues. She denies signs of infection. 5/8; patient presents for follow-up. We have not heard back if pico [name] covered by her insurance. She has been using collagen to the wound bed over the past week. She denies signs of infection. 5/18; patient presents for follow-up. She has been using collagen to the wound bed without issues. Again we have not heard if pico [name] covered by her insurance. She has no issues or complaints today. 5/23; patient presents for follow-up. She has been using collagen to the wound bed. She has no issues or complaints today. She obtained the wound VAC from [facility] and brought this in today. She denies signs of infection. 6/1; patient presents for follow-up. She has been using the wound VAC for the past week. She has had this changed twice since she was last here. She reports more maceration to the periwound. She denies signs of infection. 6/7; right TMA site. There are 2 wounds 0 separated by a bridge of healed tissue. The more lateral area has undermining. Both areas have healthy looking granulation at the base but relative the size of the wound [name] fairly deep. There [name] no exposed bone no evidence of infection. Her wound VAC was put on hold last week because of surrounding skin maceration she has been using collagen this week. She has a modified shoe 6/12; patient presents for follow-up. Last week the wound VAC was reinitiated. She had no issues with the wound VAC itself. Today she has maceration again noted to the surrounding skin. She denies signs of infection. 6/27; patient presents for follow-up. She has been using Medihoney to the wound bed. We took a break from the wound VAC because the periwound was macerated. She still has some areas of maceration to  the distal foot where there [name] a callus. She currently denies signs of infection. 7/11; patient presents for follow-up. She has been  using Medihoney to the wound bed. She has developed some increased warmth and erythema to the lateral aspect of the right foot. She states this [name] occurred over the past week and there [name] increased pain. No drainage noted. 7/17; patient presents for follow-up. She has been using Medihoney to the wound bed. She completed her course of Bactrim. She reports improvement in symptoms. 7/24; Patient presents for follow up. She has been using Medihoney to the wound bed without issues. She completed another course of Bactrim. She reports improvement in her symptoms but still has some mild tenderness to the medial aspect of the foot. 7/31; Patient presents for follow-up. She has been using Medihoney and Dakin's to the wound bed. She denies signs of infection. 8/14; patient presents for follow-up. She has been using Dakin's wet-to-dry packing strips to the right medial aspect of the amputation site and Medihoney to the anterior site. She has no issues or complaints today. She has started physical therapy. She denies signs of infection. 8/29; patient presents for follow-up. She has been using Dakin's wet-to-dry packing strips to the right medial aspect of the amputation site however this [name] becoming more difficult to place. She did report that she had increased redness and swelling to that site and developed some drainage. It has resolved. She continues with physical therapy. 9/8; patient presents for follow-up. We have been using silver alginate to the tunneled area. She completed her course of antibiotics. She reports no pain, increased swelling or erythema. 9/21; patient presents for follow-up. She has been using gentamicin to the tunneled area. She has no issues or complaints today. She denies signs of infection. 10/2; patient presents for follow-up. [name] scheduled to have her CT scan on 10/9. She currently denies signs of infection. She denies increased warmth, erythema or purulent drainage from the wound  bed. She has been using Dakin's wet-to-dry dressings. 10/12; patient presents for follow-up. She had her CT scan on 10/9 that showed A small irregular rim-enhancing fluid pocket communicating to the overlying soft tissues of the sinus tract compatible with a small abscess. Currently she denies systemic signs of infection. She has been doing Dakin's wet-to-dry packing strips but it [name] hard for her to pack into the narrow opening. 10/30; patient presents for follow-up. Since last clinic visit she has had OR debridement of her Right foot by Dr. Odis Hollingshead Due to abscess noted on CT. She has been using iodoform packing. 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 She will complete 2 weeks of this and continue with oral amoxicillin for the next 6 to 12 months. She follows with Dr. Luciana Axe for this. She currently denies signs of infection. Electronic Signature(s) Signed: 11/16/2021 1:40:10 PM By: Geralyn Corwin DO Entered By: Geralyn Corwin on 11/12/2021 15:38:24 Grant, [name] ([id number]) [id number] Page 3 of 9 -------------------------------------------------------------------------------- Physical Exam Details Patient Name: Date of Service: [name] Pereyra, [name] [name]. 11/12/2021 2:45 PM Medical Record Number: [id number] Patient Account Number: [id number] Date of Birth/Sex: Treating RN: [birth year] (41 y.o. F) Primary Care Provider: Gwinda Passe Other Clinician: Referring Provider: Treating Provider/Extender: Grace Isaac in Treatment: 42 Constitutional respirations regular, non-labored and within target range for patient.. Cardiovascular 2+ dorsalis pedis/posterior tibialis pulses. Psychiatric pleasant and cooperative. Notes Right foot: T the transmetatarsal amputation site there [name] an open wound to  the medial aspect with minimal tunneling. No increased warmth, erythema or o purulent drainage  noted. Electronic Signature(s) Signed: 11/16/2021 1:40:10 PM By: Geralyn Corwin DO Entered By: Geralyn Corwin on 11/12/2021 15:39:13 -------------------------------------------------------------------------------- Physician Orders Details Patient Name: Date of Service: [name] V IS, [name] [name]. 11/12/2021 2:45 PM Medical Record Number: [id number] Patient Account Number: [id number] Date of Birth/Sex: Treating RN: 1980/02/22 (41 y.o. [name], [name] Primary Care Provider: Gwinda Passe Other Clinician: Referring Provider: Treating Provider/Extender: Grace Isaac in Treatment: 7 Verbal / Phone Orders: No Diagnosis Coding ICD-10 Coding Code Description L97.514 Non-pressure chronic ulcer of other part of right foot with necrosis of bone E11.621 Type 2 diabetes mellitus with foot ulcer M86.9 Osteomyelitis, unspecified Follow-up Appointments ppointment in 2 weeks. - w/ Dr. Mikey Bussing Return A Anesthetic (In clinic) Topical Lidocaine 5% applied to wound bed (In clinic) Topical Lidocaine 4% applied to wound bed Bathing/ Shower/ Hygiene Other Bathing/Shower/Hygiene Orders/Instructions: - Clean with Saline or Dakins Edema Control - Lymphedema / SCD / Other Elevate legs to the level of the heart or above for 30 minutes daily and/or when sitting, a frequency of: - throughout the day void standing for long periods of time. - Limit time/pressure on feet, reduce PT Use wheelchair instead of walker when you can. . A Moisturize legs daily. Off-Loading Open toe surgical shoe to: - surgical shoe: reduce pressure when walking Additional Orders / Instructions Follow Nutritious Diet - -Monitor/Control Blood Sugar -High Protein Diet Franze, [name] ([id number]) [id number] Page 4 of 9 Wound Treatment Wound #2 - Amputation Site - Transmetatarsal Wound Laterality: Right, Medial Cleanser: Normal Saline (Generic) 1 x Per Day/30 Days Discharge  Instructions: Cleanse the wound with Normal Saline prior to applying a clean dressing using gauze sponges, not tissue or cotton balls. Topical: Gentamicin 1 x Per Day/30 Days Discharge Instructions: As directed by physician Prim Dressing: KerraCel Ag Gelling Fiber Dressing, 4x5 in (silver alginate) (DME) (Generic) 1 x Per Day/30 Days ary Discharge Instructions: Apply silver alginate to wound bed as instructed Secondary Dressing: Woven Gauze Sponge, Non-Sterile 4x4 in (DME) (Generic) 1 x Per Day/30 Days Discharge Instructions: Apply over primary dressing as directed. Secured With: Elastic Bandage 4 inch (ACE bandage) (DME) (Generic) 1 x Per Day/30 Days Discharge Instructions: Secure with ACE bandage as directed. Secured With: [employer], 4.5x3.1 (in/yd) (DME) (Generic) 1 x Per Day/30 Days Discharge Instructions: Secure with Kerlix as directed. Electronic Signature(s) Signed: 11/16/2021 1:40:10 PM By: Geralyn Corwin DO Entered By: Geralyn Corwin on 11/12/2021 15:39:21 -------------------------------------------------------------------------------- Problem List Details Patient Name: Date of Service: [name] V IS, [name] [name]. 11/12/2021 2:45 PM Medical Record Number: [id number] Patient Account Number: [id number] Date of Birth/Sex: Treating RN: 1980-08-13 (41 y.o. F) Primary Care Provider: Gwinda Passe Other Clinician: Referring Provider: Treating Provider/Extender: Grace Isaac in Treatment: [phone] Active Problems ICD-10 Encounter Code Description Active Date MDM Diagnosis L97.514 Non-pressure chronic ulcer of other part of right foot with necrosis of bone 01/22/2021 No Yes E11.621 Type 2 diabetes mellitus with foot ulcer 01/22/2021 No Yes M86.9 Osteomyelitis, unspecified 01/22/2021 No Yes Inactive Problems Resolved Problems Electronic Signature(s) Signed: 11/16/2021 1:40:10 PM By: Geralyn Corwin DO Entered By: Geralyn Corwin on 11/12/2021  15:14:46 [name], [name] ([id number]) [id number] Page 5 of 9 -------------------------------------------------------------------------------- Progress Note Details Patient Name: Date of Service: [name] Pereyra, [name] [name]. 11/12/2021 2:45 PM Medical Record Number: [id number] Patient Account Number: [id number] Date of Birth/Sex: Treating RN: July 16, 1980 (41 y.o. F) Primary Care  Provider: Gwinda Passe Other Clinician: Referring Provider: Treating Provider/Extender: Grace Isaac in Treatment: 42 Subjective Chief Complaint Information obtained from Patient Osteomyelitis of the right foot status post transmetatarsal amputation with surgical site dehiscence History of Present Illness (HPI) Admission 01/22/2021 [name] a 41 year old female with a past medical history of insulin-dependent uncontrolled type 2 diabetes with last hemoglobin A1c of 13.5, osteomyelitis of the right foot status post transmetatarsal amputation on 12/18/2020 that presents to the clinic for right foot wound. She has had dehiscence of the surgical site. [name] using wet-to-dry dressings. She has a PICC line and receiving IV ceftriaxone daily for her osteomyelitis. There [name] an end date of 01/27/2021. [name] also taking oral metronidazole. She currently denies systemic signs of infection. 1/19; patient presents for follow-up. She was diagnosed with a DVT to the right lower extremity 2 days ago. [name] on Eliquis now. [name] scheduled to see her infectious disease doctor tomorrow. She has been using Dakin's wet-to-dry dressings. She denies systemic signs of infection. 1/26; patient presents for follow-up. She saw infectious disease on 1/21 started on Augmentin. Her PICC line and IV ceftriaxone was discontinued. Patient reports stability to her wound. She has been using Dakin's wet-to-dry dressings. She currently denies systemic signs of infection. 2/3;  patient presents for follow-up. She continues to use Dakin's wet-to-dry dressings to the wound bed. She saw Dr. Manson Passey with infectious disease yesterday and [name] continuing Augmentin. T entative end date [name] 2/16. Patient reports following up with orthopedics. She states there [name] no further plan from them. She currently denies systemic signs of infection. 2/10; patient presents for follow-up. She continues to use Dakin's wet to dry dressings. [name] scheduled to have her MRI done on 2/14. She states that she had pain to the debridement site from last clinic visit and declines debridement today. She denies systemic signs of infection. She continues to have yellow thick drainage. 2/20; patient presents for follow-up. She continues to use Dakin's wet-to-dry dressings. She obtained her MRI. The results showed an abscess and [name] scheduled to see her orthopedic surgeon on 2/23. She saw infectious disease 2/17 and her antibiotics were extended. She currently denies systemic signs of infection. 3/6; patient presents for follow-up. She had debridement and irrigation of her foot on 03/10/2021 due to abscess noted on MRI. She was started on IV ceftriaxone and oral Flagyl. She has no issues or complaints today. She has been using iodoform packing to the tunnel and Dakin's wet-to-dry to the opening. 03/26/2021: She continues on IV ceftriaxone and oral metronidazole. She has follow-up with infectious disease tomorrow. No significant issues or complaints today. Her mother continues to help her with her wound dressing, using iodoform packing strips into the tunnel and Dakin's to the open portion of the wound. 3/20; patient presents for follow-up. She continues to be on IV ceftriaxone in oral metronidazole. She has been using iodoform to the tunnel and Dakin's wet-to- dry to the open wound. She denies signs of infection. 3/27; patient presents for follow-up. She no longer has a PICC line. She has been using iodoform  to the tunnel and Dakin's wet-to-dry to the open wound. She reports improvement in wound healing. She denies signs of infection. 4/3; patient presents for follow-up. She states she has been using Hydrofera Blue to the open wound and iodoform packing to the tunnel without any issues. She denies signs of infection. 4/18; patient presents for follow-up. She saw infectious disease  on 4/11. She has finished her oral antibiotics and completed a total of 6 weeks of antibiotics (this includes IV as well). No further antibiotics needed. She has been using Hydrofera Blue and iodoform packing. She states that the tunneled area has come in and the iodoform [name] not staying in place anymore. She has no issues or complaints today. She denies signs of infection. 4/24; patient presents for follow-up. She saw Dr. Despina Pole, plastic surgery to discuss potential skin graft/substitute placement. At this time he thinks that the skin graft would likely not take. He [name] in agreement with trying a wound VAC. Patient has been using Hydrofera Blue dressing changes with no issues. She denies signs of infection. She reports improvement in wound healing. 5/1; patient presents for follow-up. Unfortunately patient did not have insurance when we [place]. There [name] an assistance program and we are trying to get this accommodated for the patient. In the meantime she has been using Hydrofera Blue without any issues. She denies signs of infection. 5/8; patient presents for follow-up. We have not heard back if pico [name] covered by her insurance. She has been using collagen to the wound bed over the past week. She denies signs of infection. 5/18; patient presents for follow-up. She has been using collagen to the wound bed without issues. Again we have not heard if pico [name] covered by her insurance. She has no issues or complaints today. 5/23; patient presents for follow-up. She has been using collagen to the wound bed. She has no issues  or complaints today. She obtained the wound VAC from [facility] and brought this in today. She denies signs of infection. 6/1; patient presents for follow-up. She has been using the wound VAC for the past week. She has had this changed twice since she was last here. She reports more maceration to the periwound. She denies signs of infection. 6/7; right TMA site. There are 2 wounds 0 separated by a bridge of healed tissue. The more lateral area has undermining. Both areas have healthy looking granulation at the base but relative the size of the wound [name] fairly deep. There [name] no exposed bone no evidence of infection. [name], [name] ([id number]) [id number] Page 6 of 9 Her wound VAC was put on hold last week because of surrounding skin maceration she has been using collagen this week. She has a modified shoe 6/12; patient presents for follow-up. Last week the wound VAC was reinitiated. She had no issues with the wound VAC itself. Today she has maceration again noted to the surrounding skin. She denies signs of infection. 6/27; patient presents for follow-up. She has been using Medihoney to the wound bed. We took a break from the wound VAC because the periwound was macerated. She still has some areas of maceration to the distal foot where there [name] a callus. She currently denies signs of infection. 7/11; patient presents for follow-up. She has been using Medihoney to the wound bed. She has developed some increased warmth and erythema to the lateral aspect of the right foot. She states this [name] occurred over the past week and there [name] increased pain. No drainage noted. 7/17; patient presents for follow-up. She has been using Medihoney to the wound bed. She completed her course of Bactrim. She reports improvement in symptoms. 7/24; Patient presents for follow up. She has been using Medihoney to the wound bed without issues. She completed another course of Bactrim. She  reports improvement in her symptoms but still has some  mild tenderness to the medial aspect of the foot. 7/31; Patient presents for follow-up. She has been using Medihoney and Dakin's to the wound bed. She denies signs of infection. 8/14; patient presents for follow-up. She has been using Dakin's wet-to-dry packing strips to the right medial aspect of the amputation site and Medihoney to the anterior site. She has no issues or complaints today. She has started physical therapy. She denies signs of infection. 8/29; patient presents for follow-up. She has been using Dakin's wet-to-dry packing strips to the right medial aspect of the amputation site however this [name] becoming more difficult to place. She did report that she had increased redness and swelling to that site and developed some drainage. It has resolved. She continues with physical therapy. 9/8; patient presents for follow-up. We have been using silver alginate to the tunneled area. She completed her course of antibiotics. She reports no pain, increased swelling or erythema. 9/21; patient presents for follow-up. She has been using gentamicin to the tunneled area. She has no issues or complaints today. She denies signs of infection. 10/2; patient presents for follow-up. [name] scheduled to have her CT scan on 10/9. She currently denies signs of infection. She denies increased warmth, erythema or purulent drainage from the wound bed. She has been using Dakin's wet-to-dry dressings. 10/12; patient presents for follow-up. She had her CT scan on 10/9 that showed A small irregular rim-enhancing fluid pocket communicating to the overlying soft tissues of the sinus tract compatible with a small abscess. Currently she denies systemic signs of infection. She has been doing Dakin's wet-to-dry packing strips but it [name] hard for her to pack into the narrow opening. 10/30; patient presents for follow-up. Since last clinic visit she has had OR debridement of  her Right foot by Dr. Odis Hollingshead Due to abscess noted on CT. She has been using iodoform packing. 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 She will complete 2 weeks of this and continue with oral amoxicillin for the next 6 to 12 months. She follows with Dr. Luciana Axe for this. She currently denies signs of infection. Patient History Information obtained from Patient. Family History Cancer - Paternal Grandparents, Diabetes - Mother, Hypertension - Mother, Stroke - Maternal Grandparents, No family history of Heart Disease, Hereditary Spherocytosis, Kidney Disease, Lung Disease, Seizures, Thyroid Problems, Tuberculosis. Social History Never smoker, Marital Status - Single, Alcohol Use - Rarely, Drug Use - Prior History - Marijuana, Caffeine Use - Daily. Medical History Cardiovascular Patient has history of Hypertension Endocrine Patient has history of Type II Diabetes Musculoskeletal Patient has history of Osteomyelitis - Right Transmet 12/18/20 Neurologic Patient has history of Neuropathy Hospitalization/Surgery History - inpatient right foot abscess 10/12-10/18/2023. Objective Constitutional respirations regular, non-labored and within target range for patient.. Vitals Time Taken: 3:05 PM, Height: 69 in, Temperature: 98.5 F, Pulse: 93 bpm, Respiratory Rate: 20 breaths/min, Blood Pressure: 116/77 mmHg, Capillary Blood Glucose: 117 mg/dl. Cardiovascular 2+ dorsalis pedis/posterior tibialis pulses. Psychiatric pleasant and cooperative. General Notes: Right foot: T the transmetatarsal amputation site there [name] an open wound to the medial aspect with minimal tunneling. No increased warmth, [name], [name] ([id number]) [id number] Page 7 of 9 erythema or purulent drainage noted. Integumentary (Hair, Skin) Wound #2 status [name] Open. Original cause of wound was Surgical Injury. The date acquired was: 07/10/2021. The wound has been  in treatment 17 weeks. The wound [name] located on the Right,Medial Amputation Site - Transmetatarsal. The wound measures 0.4cm length x  1.6cm width x 1.3cm depth; 0.503cm^2 area and 0.653cm^3 volume. There [name] Fat Layer (Subcutaneous Tissue) exposed. There [name] undermining starting at 9:00 and ending at 3:00 with a maximum distance of 1.2cm. There [name] a medium amount of serosanguineous drainage noted. The wound margin [name] distinct with the outline attached to the wound base. There [name] large (67-100%) pink, pale granulation within the wound bed. There [name] a small (1-33%) amount of necrotic tissue within the wound bed including Adherent Slough. The periwound skin appearance had no abnormalities noted for color. The periwound skin appearance exhibited: Callus. The periwound skin appearance did not exhibit: Crepitus, Excoriation, Induration, Rash, Scarring, Dry/Scaly, Maceration. Periwound temperature was noted as No Abnormality. General Notes: per patient surgery while inpatient to clean out the wound. Assessment Active Problems ICD-10 Non-pressure chronic ulcer of other part of right foot with necrosis of bone Type 2 diabetes mellitus with foot ulcer Osteomyelitis, unspecified Patient had OR debridement of her right foot secondary to an abscess with a sinus tract On 10/14. [name] on oral antibiotics per ID. At this time I recommended silver alginate dressings with gentamicin ointment.Continue aggressive offloading. Plan Follow-up Appointments: Return Appointment in 2 weeks. - w/ Dr. Mikey Bussing Anesthetic: (In clinic) Topical Lidocaine 5% applied to wound bed (In clinic) Topical Lidocaine 4% applied to wound bed Bathing/ Shower/ Hygiene: Other Bathing/Shower/Hygiene Orders/Instructions: - Clean with Saline or Dakins Edema Control - Lymphedema / SCD / Other: Elevate legs to the level of the heart or above for 30 minutes daily and/or when sitting, a frequency of: - throughout the day Avoid standing for long  periods of time. - Limit time/pressure on feet, reduce PT Use wheelchair instead of walker when you can. . Moisturize legs daily. Off-Loading: Open toe surgical shoe to: - surgical shoe: reduce pressure when walking Additional Orders / Instructions: Follow Nutritious Diet - -Monitor/Control Blood Sugar -High Protein Diet WOUND #2: - Amputation Site - Transmetatarsal Wound Laterality: Right, Medial Cleanser: Normal Saline (Generic) 1 x Per Day/30 Days Discharge Instructions: Cleanse the wound with Normal Saline prior to applying a clean dressing using gauze sponges, not tissue or cotton balls. Topical: Gentamicin 1 x Per Day/30 Days Discharge Instructions: As directed by physician Prim Dressing: KerraCel Ag Gelling Fiber Dressing, 4x5 in (silver alginate) (DME) (Generic) 1 x Per Day/30 Days ary Discharge Instructions: Apply silver alginate to wound bed as instructed Secondary Dressing: Woven Gauze Sponge, Non-Sterile 4x4 in (DME) (Generic) 1 x Per Day/30 Days Discharge Instructions: Apply over primary dressing as directed. Secured With: Elastic Bandage 4 inch (ACE bandage) (DME) (Generic) 1 x Per Day/30 Days Discharge Instructions: Secure with ACE bandage as directed. Secured With: [employer], 4.5x3.1 (in/yd) (DME) (Generic) 1 x Per Day/30 Days Discharge Instructions: Secure with Kerlix as directed. 1. Gentamicin with silver alginate dressings 2. Aggressive offloadingoosurgical shoe 3. Follow-up in 2 weeks 4. Continue oral antibiotics per ID Electronic Signature(s) Signed: 11/16/2021 1:40:10 PM By: Geralyn Corwin DO Entered By: Geralyn Corwin on 11/12/2021 15:44:31 [name], [name] ([id number]) [id number] Page 8 of 9 -------------------------------------------------------------------------------- HxROS Details Patient Name: Date of Service: [name] Pereyra, [name] [name]. 11/12/2021 2:45 PM Medical Record Number: [id number] Patient Account Number:  [id number] Date of Birth/Sex: Treating RN: Nov 13, 1980 (41 y.o. [name] Primary Care Provider: Gwinda Passe Other Clinician: Referring Provider: Treating Provider/Extender: Grace Isaac in Treatment: 42 Information Obtained From Patient Cardiovascular Medical History: Positive for: Hypertension Endocrine Medical History: Positive for: Type II Diabetes Time with diabetes: Dx  2009 Treated with: Insulin, Oral agents Blood sugar tested every day: Yes Tested : daily Musculoskeletal Medical History: Positive for: Osteomyelitis - Right Transmet 12/18/20 Neurologic Medical History: Positive for: Neuropathy Immunizations Pneumococcal Vaccine: Received Pneumococcal Vaccination: No Implantable Devices Yes Hospitalization / Surgery History Type of Hospitalization/Surgery inpatient right foot abscess 10/12-10/18/2023 Family and Social History Cancer: Yes - Paternal Grandparents; Diabetes: Yes - Mother; Heart Disease: No; Hereditary Spherocytosis: No; Hypertension: Yes - Mother; Kidney Disease: No; Lung Disease: No; Seizures: No; Stroke: Yes - Maternal Grandparents; Thyroid Problems: No; Tuberculosis: No; Never smoker; Marital Status - Single; Alcohol Use: Rarely; Drug Use: Prior History - Marijuana; Caffeine Use: Daily; Financial Concerns: No; Food, Clothing or Shelter Needs: No; Support System Lacking: No; Transportation Concerns: No Psychologist, prison and probation serviceslectronic Signature(s) Signed: 11/12/2021 4:53:44 PM By: Shawn Stalleaton, [name] RN, BSN Signed: 11/16/2021 1:40:10 PM By: Geralyn CorwinHoffman, Angle Karel DO Entered By: Geralyn CorwinHoffman, Ioana Louks on 11/12/2021 15:38:30 -------------------------------------------------------------------------------- SuperBill Details Patient Name: Date of Service: [name] Delman KittenV IS, [name] [name]. 11/12/2021 Medical Record Number: [id number] Patient Account Number: [id number] Date of Birth/Sex: Treating RN: 07-Sep-1980 (41 y.o. Ardis RowanF) Breedlove, [name] Primary Care Provider: Gwinda PasseEdwards,  Michelle Other Clinician: Referring Provider: Treating Provider/Extender: Grace IsaacHoffman, Jameisha Stofko Edwards, Michelle Weeks in Treatment: [address] [place], [name] ([id number]) [id number] Page 9 of 9 Diagnosis Coding ICD-10 Codes Code Description L97.514 Non-pressure chronic ulcer of other part of right foot with necrosis of bone E11.621 Type 2 diabetes mellitus with foot ulcer M86.9 Osteomyelitis, unspecified Facility Procedures : CPT4 Code: [id number] Description: 99213 - WOUND CARE VISIT-LEV 3 EST PT Modifier: Quantity: 1 Physician Procedures : CPT4 Code Description Modifier [id number] 99213 - WC PHYS LEVEL 3 - EST PT ICD-10 Diagnosis Description L97.514 Non-pressure chronic ulcer of other part of right foot with necrosis of bone E11.621 Type 2 diabetes mellitus with foot ulcer M86.9  Osteomyelitis, unspecified Quantity: 1 Electronic Signature(s) Signed: 11/16/2021 1:40:10 PM By: Geralyn CorwinHoffman, Sahiba Granholm DO Entered By: Geralyn CorwinHoffman, Lowanda Cashaw on 11/12/2021 15:44:38

## 2021-11-21 ENCOUNTER — Encounter: Payer: Medicaid Other | Attending: Primary Care | Admitting: Registered"

## 2021-11-21 DIAGNOSIS — E1165 Type 2 diabetes mellitus with hyperglycemia: Secondary | ICD-10-CM | POA: Diagnosis not present

## 2021-11-21 NOTE — Progress Notes (Unsigned)
Jardiance started in October , trulicity 1x week, lantus 70 u before breakfast sometimes forgets, metformin 1000 mid day, 1000 dinner since came home from hospital. Gabapentin Oct 18 hospital Before surgery Dec last year, A1c was probably more than 15.  Sleep: no scheduled sleep pattern, since home from hospital.  ~2 am - 8 am, stays in bed and talks on phone, naps throughout the day. Phone, tiktoc, jewelry, back porch Usually not hungry until 3 hrs after waking If mother makes breakfast will eat grits, sausage, eggs, OR pancake meal

## 2021-11-21 NOTE — Patient Instructions (Addendum)
Continue to drink plenty of water to avoid side effects of Jardiance Once you start using an insulin pen, consider leaving out at room temp and use within 28 days Take you Lantus consistently at the same time each day. Talk to your doctor about getting a CGM Call insurance to see if they cover Freestyle Libre 3, or the Dexcom G7 See if you can talk to a pharmacist about when the best time is to take all your medications.  Continue going to sleep at a regular time Consider having just 100% whole grain bread Vegetables daily

## 2021-11-22 ENCOUNTER — Other Ambulatory Visit (HOSPITAL_COMMUNITY): Payer: Self-pay

## 2021-11-26 ENCOUNTER — Ambulatory Visit (INDEPENDENT_AMBULATORY_CARE_PROVIDER_SITE_OTHER): Payer: Medicaid Other | Admitting: Primary Care

## 2021-11-26 ENCOUNTER — Encounter (HOSPITAL_BASED_OUTPATIENT_CLINIC_OR_DEPARTMENT_OTHER): Payer: Medicaid Other | Attending: Internal Medicine | Admitting: Internal Medicine

## 2021-11-26 ENCOUNTER — Encounter (INDEPENDENT_AMBULATORY_CARE_PROVIDER_SITE_OTHER): Payer: Self-pay | Admitting: Primary Care

## 2021-11-26 VITALS — BP 106/71 | HR 92 | Resp 16

## 2021-11-26 DIAGNOSIS — Z09 Encounter for follow-up examination after completed treatment for conditions other than malignant neoplasm: Secondary | ICD-10-CM

## 2021-11-26 DIAGNOSIS — L97514 Non-pressure chronic ulcer of other part of right foot with necrosis of bone: Secondary | ICD-10-CM | POA: Insufficient documentation

## 2021-11-26 DIAGNOSIS — I10 Essential (primary) hypertension: Secondary | ICD-10-CM

## 2021-11-26 DIAGNOSIS — E11621 Type 2 diabetes mellitus with foot ulcer: Secondary | ICD-10-CM | POA: Diagnosis present

## 2021-11-26 DIAGNOSIS — M869 Osteomyelitis, unspecified: Secondary | ICD-10-CM | POA: Insufficient documentation

## 2021-11-26 DIAGNOSIS — E782 Mixed hyperlipidemia: Secondary | ICD-10-CM

## 2021-11-26 DIAGNOSIS — E1165 Type 2 diabetes mellitus with hyperglycemia: Secondary | ICD-10-CM

## 2021-11-26 DIAGNOSIS — Z794 Long term (current) use of insulin: Secondary | ICD-10-CM | POA: Insufficient documentation

## 2021-11-26 DIAGNOSIS — Z7901 Long term (current) use of anticoagulants: Secondary | ICD-10-CM | POA: Diagnosis not present

## 2021-11-26 DIAGNOSIS — Z89431 Acquired absence of right foot: Secondary | ICD-10-CM | POA: Diagnosis not present

## 2021-11-26 DIAGNOSIS — E1140 Type 2 diabetes mellitus with diabetic neuropathy, unspecified: Secondary | ICD-10-CM | POA: Insufficient documentation

## 2021-11-26 NOTE — Progress Notes (Signed)
[name], [name] ([id number]) [id number] Page 1 of 10 Visit Report for 11/26/2021 Chief Complaint Document Details Patient Name: Date of Service: [name] [name], [name] [name]. 11/26/2021 9:30 A M Medical Record Number: [id number] Patient Account Number: [id number] Date of Birth/Sex: Treating RN: September 23, 1980 (41 y.o. F) Primary Care Provider: Gwinda Passe Other Clinician: Referring Provider: Treating Provider/Extender: Grace Isaac in Treatment: 109 Information Obtained from: Patient Chief Complaint Osteomyelitis of the right foot status post transmetatarsal amputation with surgical site dehiscence Electronic Signature(s) Signed: 11/26/2021 1:13:56 PM By: Geralyn Corwin DO Entered By: Geralyn Corwin on 11/26/2021 12:42:14 -------------------------------------------------------------------------------- Debridement Details Patient Name: Date of Service: [name], [name]. 11/26/2021 9:30 A M Medical Record Number: [id number] Patient Account Number: [id number] Date of Birth/Sex: Treating RN: August 12, 1980 (41 y.o. [name], [name] Primary Care Provider: Gwinda Passe Other Clinician: Referring Provider: Treating Provider/Extender: Grace Isaac in Treatment: 44 Debridement Performed for Assessment: Wound #2 Right,Medial Amputation Site - Transmetatarsal Performed By: Physician Geralyn Corwin, DO Debridement Type: Debridement Severity of Tissue Pre Debridement: Fat layer exposed Level of Consciousness (Pre-procedure): Awake and Alert Pre-procedure Verification/Time Out Yes - 10:35 Taken: Start Time: 10:35 Pain Control: Lidocaine T Area Debrided ([name] x W): otal 0.4 (cm) x 1.3 (cm) = 0.52 (cm) Tissue and other material debrided: Viable, Non-Viable, Callus, Slough, Subcutaneous, Slough Level: Skin/Subcutaneous Tissue Debridement Description: Excisional Instrument: Curette Bleeding:  Minimum Hemostasis Achieved: Pressure End Time: 10:35 Procedural Pain: 0 Post Procedural Pain: 0 Response to Treatment: Procedure was tolerated well Level of Consciousness (Post- Awake and Alert procedure): Post Debridement Measurements of Total Wound Length: (cm) 0.4 Width: (cm) 1.3 Depth: (cm) 0.7 Volume: (cm) 0.286 Character of Wound/Ulcer Post Debridement: Improved Severity of Tissue Post Debridement: Fat layer exposed [name] ([id number]) [id number] Page 2 of 10 Post Procedure Diagnosis Same as Pre-procedure Electronic Signature(s) Signed: 11/26/2021 1:13:56 PM By: Geralyn Corwin DO Signed: 11/26/2021 4:10:29 PM By: Fonnie Mu RN Entered By: Fonnie Mu on 11/26/2021 10:40:30 -------------------------------------------------------------------------------- HPI Details Patient Name: Date of Service: [name], [name]. 11/26/2021 9:30 A M Medical Record Number: [id number] Patient Account Number: [id number] Date of Birth/Sex: Treating RN: 07-14-1980 (41 y.o. F) Primary Care Provider: Gwinda Passe Other Clinician: Referring Provider: Treating Provider/Extender: Grace Isaac in Treatment: 82 History of Present Illness HPI Description: Admission 01/22/2021 [name] a 41 year old female with a past medical history of insulin-dependent uncontrolled type 2 diabetes with last hemoglobin A1c of 13.5, osteomyelitis of the right foot status post transmetatarsal amputation on 12/18/2020 that presents to the clinic for right foot wound. She has had dehiscence of the surgical site. [name] using wet-to-dry dressings. She has a PICC line and receiving IV ceftriaxone daily for her osteomyelitis. There [name] an end date of 01/27/2021. [name] also taking oral metronidazole. She currently denies systemic signs of infection. 1/19; patient presents for follow-up. She was diagnosed with a DVT to the  right lower extremity 2 days ago. [name] on Eliquis now. [name] scheduled to see her infectious disease doctor tomorrow. She has been using Dakin's wet-to-dry dressings. She denies systemic signs of infection. 1/26; patient presents for follow-up. She saw infectious disease on 1/21 started on Augmentin. Her PICC line and IV ceftriaxone was discontinued. Patient reports stability to her wound. She has been using Dakin's wet-to-dry dressings. She currently denies systemic signs of infection. 2/3; patient presents for follow-up. She continues to use Dakin's wet-to-dry dressings  to the wound bed. She saw Dr. Manson Passey with infectious disease yesterday and [name] continuing Augmentin. T entative end date [name] 2/16. Patient reports following up with orthopedics. She states there [name] no further plan from them. She currently denies systemic signs of infection. 2/10; patient presents for follow-up. She continues to use Dakin's wet to dry dressings. [name] scheduled to have her MRI done on 2/14. She states that she had pain to the debridement site from last clinic visit and declines debridement today. She denies systemic signs of infection. She continues to have yellow thick drainage. 2/20; patient presents for follow-up. She continues to use Dakin's wet-to-dry dressings. She obtained her MRI. The results showed an abscess and [name] scheduled to see her orthopedic surgeon on 2/23. She saw infectious disease 2/17 and her antibiotics were extended. She currently denies systemic signs of infection. 3/6; patient presents for follow-up. She had debridement and irrigation of her foot on 03/10/2021 due to abscess noted on MRI. She was started on IV ceftriaxone and oral Flagyl. She has no issues or complaints today. She has been using iodoform packing to the tunnel and Dakin's wet-to-dry to the opening. 03/26/2021: She continues on IV ceftriaxone and oral metronidazole. She has follow-up with infectious disease tomorrow. No  significant issues or complaints today. Her mother continues to help her with her wound dressing, using iodoform packing strips into the tunnel and Dakin's to the open portion of the wound. 3/20; patient presents for follow-up. She continues to be on IV ceftriaxone in oral metronidazole. She has been using iodoform to the tunnel and Dakin's wet-to- dry to the open wound. She denies signs of infection. 3/27; patient presents for follow-up. She no longer has a PICC line. She has been using iodoform to the tunnel and Dakin's wet-to-dry to the open wound. She reports improvement in wound healing. She denies signs of infection. 4/3; patient presents for follow-up. She states she has been using Hydrofera Blue to the open wound and iodoform packing to the tunnel without any issues. She denies signs of infection. 4/18; patient presents for follow-up. She saw infectious disease on 4/11. She has finished her oral antibiotics and completed a total of 6 weeks of antibiotics (this includes IV as well). No further antibiotics needed. She has been using Hydrofera Blue and iodoform packing. She states that the tunneled area has come in and the iodoform [name] not staying in place anymore. She has no issues or complaints today. She denies signs of infection. 4/24; patient presents for follow-up. She saw Dr. Carlene Coria, plastic surgery to discuss potential skin graft/substitute placement. At this time he thinks that the skin graft would likely not take. He [name] in agreement with trying a wound VAC. Patient has been using Hydrofera Blue dressing changes with no issues. She denies signs of infection. She reports improvement in wound healing. 5/1; patient presents for follow-up. Unfortunately patient did not have insurance when we ran for the [place]. There [name] an assistance program and we are trying to get this accommodated for the patient. In the meantime she has been using Hydrofera Blue without any issues. She denies signs of  infection. 5/8; patient presents for follow-up. We have not heard back if pico [name] covered by her insurance. She has been using collagen to the wound bed over the past week. She denies signs of infection. 5/18; patient presents for follow-up. She has been using collagen to the wound bed without issues. Again we have not heard if pico [name] covered  by her insurance. She has no issues or complaints today. [name], [name] ([id number]) [id number] Page 3 of 10 5/23; patient presents for follow-up. She has been using collagen to the wound bed. She has no issues or complaints today. She obtained the wound VAC from [facility] and brought this in today. She denies signs of infection. 6/1; patient presents for follow-up. She has been using the wound VAC for the past week. She has had this changed twice since she was last here. She reports more maceration to the periwound. She denies signs of infection. 6/7; right TMA site. There are 2 wounds 0 separated by a bridge of healed tissue. The more lateral area has undermining. Both areas have healthy looking granulation at the base but relative the size of the wound [name] fairly deep. There [name] no exposed bone no evidence of infection. Her wound VAC was put on hold last week because of surrounding skin maceration she has been using collagen this week. She has a modified shoe 6/12; patient presents for follow-up. Last week the wound VAC was reinitiated. She had no issues with the wound VAC itself. Today she has maceration again noted to the surrounding skin. She denies signs of infection. 6/27; patient presents for follow-up. She has been using Medihoney to the wound bed. We took a break from the wound VAC because the periwound was macerated. She still has some areas of maceration to the distal foot where there [name] a callus. She currently denies signs of infection. 7/11; patient presents for follow-up. She has been using Medihoney to the wound bed. She  has developed some increased warmth and erythema to the lateral aspect of the right foot. She states this [name] occurred over the past week and there [name] increased pain. No drainage noted. 7/17; patient presents for follow-up. She has been using Medihoney to the wound bed. She completed her course of Bactrim. She reports improvement in symptoms. 7/24; Patient presents for follow up. She has been using Medihoney to the wound bed without issues. She completed another course of Bactrim. She reports improvement in her symptoms but still has some mild tenderness to the medial aspect of the foot. 7/31; Patient presents for follow-up. She has been using Medihoney and Dakin's to the wound bed. She denies signs of infection. 8/14; patient presents for follow-up. She has been using Dakin's wet-to-dry packing strips to the right medial aspect of the amputation site and Medihoney to the anterior site. She has no issues or complaints today. She has started physical therapy. She denies signs of infection. 8/29; patient presents for follow-up. She has been using Dakin's wet-to-dry packing strips to the right medial aspect of the amputation site however this [name] becoming more difficult to place. She did report that she had increased redness and swelling to that site and developed some drainage. It has resolved. She continues with physical therapy. 9/8; patient presents for follow-up. We have been using silver alginate to the tunneled area. She completed her course of antibiotics. She reports no pain, increased swelling or erythema. 9/21; patient presents for follow-up. She has been using gentamicin to the tunneled area. She has no issues or complaints today. She denies signs of infection. 10/2; patient presents for follow-up. [name] scheduled to have her CT scan on 10/9. She currently denies signs of infection. She denies increased warmth, erythema or purulent drainage from the wound bed. She has been using Dakin's  wet-to-dry dressings. 10/12; patient presents for follow-up. She had her CT scan on  10/9 that showed A small irregular rim-enhancing fluid pocket communicating to the overlying soft tissues of the sinus tract compatible with a small abscess. Currently she denies systemic signs of infection. She has been doing Dakin's wet-to-dry packing strips but it [name] hard for her to pack into the narrow opening. 10/30; patient presents for follow-up. Since last clinic visit she has had OR debridement of her Right foot by Dr. Odis Hollingshead Due to abscess noted on CT. She has been using iodoform packing. 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 She will complete 2 weeks of this and continue with oral amoxicillin for the next 6 to 12 months. She follows with Dr. Luciana Axe for this. She currently denies signs of infection. 11/13; patient presents for follow-up. Patient has been using silver alginate with gentamicin to the wound bed. She has no issues or complaints today she. She reports improvement in wound healing. Electronic Signature(s) Signed: 11/26/2021 1:13:56 PM By: Geralyn Corwin DO Entered By: Geralyn Corwin on 11/26/2021 12:43:08 -------------------------------------------------------------------------------- Physical Exam Details Patient Name: Date of Service: [name], [name]. 11/26/2021 9:30 A M Medical Record Number: [id number] Patient Account Number: [id number] Date of Birth/Sex: Treating RN: [birth year] (41 y.o. F) Primary Care Provider: Gwinda Passe Other Clinician: Referring Provider: Treating Provider/Extender: Grace Isaac in Treatment: 44 Constitutional respirations regular, non-labored and within target range for patient.Marland Kitchen Psychiatric pleasant and cooperative. Notes [name], [name] ([id number]) [id number] Page 4 of 10 Right foot: T the transmetatarsal amputation site there [name] an open  wound with slough and granulation tissue with a tunnel to the medial aspect. No increased o warmth, erythema or purulent drainage noted. Electronic Signature(s) Signed: 11/26/2021 1:13:56 PM By: Geralyn Corwin DO Entered By: Geralyn Corwin on 11/26/2021 12:48:55 -------------------------------------------------------------------------------- Physician Orders Details Patient Name: Date of Service: [name], [name]. 11/26/2021 9:30 A M Medical Record Number: [id number] Patient Account Number: [id number] Date of Birth/Sex: Treating RN: 04-19-1980 (41 y.o. [name], [name] Primary Care Provider: Gwinda Passe Other Clinician: Referring Provider: Treating Provider/Extender: Grace Isaac in Treatment: 65 Verbal / Phone Orders: No Diagnosis Coding Follow-up Appointments ppointment in 2 weeks. - w/ Dr. Mikey Bussing Return A Anesthetic (In clinic) Topical Lidocaine 5% applied to wound bed (In clinic) Topical Lidocaine 4% applied to wound bed Bathing/ Shower/ Hygiene Other Bathing/Shower/Hygiene Orders/Instructions: - Clean with Saline or Dakins Edema Control - Lymphedema / SCD / Other Elevate legs to the level of the heart or above for 30 minutes daily and/or when sitting, a frequency of: - throughout the day void standing for long periods of time. - Limit time/pressure on feet, reduce PT Use wheelchair instead of walker when you can. . A Moisturize legs daily. Off-Loading Open toe surgical shoe to: - surgical shoe: reduce pressure when walking Additional Orders / Instructions Follow Nutritious Diet - -Monitor/Control Blood Sugar -High Protein Diet Wound Treatment Wound #2 - Amputation Site - Transmetatarsal Wound Laterality: Right, Medial Cleanser: Normal Saline (Generic) 1 x Per Day/30 Days Discharge Instructions: Cleanse the wound with Normal Saline prior to applying a clean dressing using gauze sponges, not tissue or cotton balls. Topical:  Gentamicin 1 x Per Day/30 Days Discharge Instructions: As directed by physician Prim Dressing: KerraCel Ag Gelling Fiber Dressing, 4x5 in (silver alginate) (Generic) 1 x Per Day/30 Days ary Discharge Instructions: Apply silver alginate to wound bed as instructed Secondary Dressing: Woven Gauze Sponge, Non-Sterile 4x4 in (Generic) 1 x Per Day/30 Days  Discharge Instructions: Apply over primary dressing as directed. Secured With: Elastic Bandage 4 inch (ACE bandage) (Generic) 1 x Per Day/30 Days Discharge Instructions: Secure with ACE bandage as directed. Secured With: [employer] Group[employer]erlix Roll Sterile, 4.5x3.1 (in/yd) (Generic) 1 x Per Day/30 Days Discharge Instructions: Secure with Kerlix as directed. Electronic Signature(s) Signed: 11/26/2021 1:13:56 PM By: Geralyn CorwinHoffman, Briston Lax DO Entered By: Geralyn CorwinHoffman, Caree Wolpert on 11/26/2021 12:51:09 Hiser, Sharol HarnessMARKIA [name] ([id number]) [id number] Page 5 of 10 -------------------------------------------------------------------------------- Problem List Details Patient Name: Date of Service: [name] MillardDA V [name], [name]. 11/26/2021 9:30 A M Medical Record Number: [id number] Patient Account Number: [id number] Date of Birth/Sex: Treating RN: 1980-09-29 (41 y.o. F) Primary Care Provider: Gwinda PasseEdwards, Michelle Other Clinician: Referring Provider: Treating Provider/Extender: Grace IsaacHoffman, Rubi Tooley Edwards, Michelle Weeks in Treatment: 4544 Active Problems ICD-10 Encounter Code Description Active Date MDM Diagnosis L97.514 Non-pressure chronic ulcer of other part of right foot with necrosis of bone 01/22/2021 No Yes E11.621 Type 2 diabetes mellitus with foot ulcer 01/22/2021 No Yes M86.9 Osteomyelitis, unspecified 01/22/2021 No Yes Inactive Problems Resolved Problems Electronic Signature(s) Signed: 11/26/2021 1:13:56 PM By: Geralyn CorwinHoffman, Emin Foree DO Entered By: Geralyn CorwinHoffman, Varetta Chavers on 11/26/2021  12:41:12 -------------------------------------------------------------------------------- Progress Note Details Patient Name: Date of Service: [name], [name]. 11/26/2021 9:30 A M Medical Record Number: [id number] Patient Account Number: [id number] Date of Birth/Sex: Treating RN: 1980-09-29 (41 y.o. F) Primary Care Provider: Gwinda PasseEdwards, Michelle Other Clinician: Referring Provider: Treating Provider/Extender: Grace IsaacHoffman, Kennette Cuthrell Edwards, Michelle Weeks in Treatment: 11044 Subjective Chief Complaint Information obtained from Patient Osteomyelitis of the right foot status post transmetatarsal amputation with surgical site dehiscence History of Present Illness (HPI) Admission 01/22/2021 Ms. Rosey BathMarkia Arreaga [name] a 41 year old female with a past medical history of insulin-dependent uncontrolled type 2 diabetes with last hemoglobin A1c of 13.5, osteomyelitis of the right foot status post transmetatarsal amputation on 12/18/2020 that presents to the clinic for right foot wound. She has had dehiscence of the surgical site. [name] using wet-to-dry dressings. She has a PICC line and receiving IV ceftriaxone daily for her osteomyelitis. There [name] an end date of 01/27/2021. [name] also taking oral metronidazole. She currently denies systemic signs of infection. 1/19; patient presents for follow-up. She was diagnosed with a DVT to the right lower extremity 2 days ago. [name] on Eliquis now. [name] scheduled to see her infectious disease doctor tomorrow. She has been using Dakin's wet-to-dry dressings. She denies systemic signs of infection. 1/26; patient presents for follow-up. She saw infectious disease on 1/21 started on Augmentin. Her PICC line and IV ceftriaxone was discontinued. Patient [name] HoesDAVIS, Mida [name] ([id number]) [id number] Page 6 of 10 reports stability to her wound. She has been using Dakin's wet-to-dry dressings. She currently denies systemic signs of infection. 2/3;  patient presents for follow-up. She continues to use Dakin's wet-to-dry dressings to the wound bed. She saw Dr. Manson PasseyMandahar with infectious disease yesterday and [name] continuing Augmentin. T entative end date [name] 2/16. Patient reports following up with orthopedics. She states there [name] no further plan from them. She currently denies systemic signs of infection. 2/10; patient presents for follow-up. She continues to use Dakin's wet to dry dressings. [name] scheduled to have her MRI done on 2/14. She states that she had pain to the debridement site from last clinic visit and declines debridement today. She denies systemic signs of infection. She continues to have yellow thick drainage. 2/20; patient presents for follow-up. She continues to use Dakin's wet-to-dry dressings. She obtained her MRI. The results showed an  abscess and [name] scheduled to see her orthopedic surgeon on 2/23. She saw infectious disease 2/17 and her antibiotics were extended. She currently denies systemic signs of infection. 3/6; patient presents for follow-up. She had debridement and irrigation of her foot on 03/10/2021 due to abscess noted on MRI. She was started on IV ceftriaxone and oral Flagyl. She has no issues or complaints today. She has been using iodoform packing to the tunnel and Dakin's wet-to-dry to the opening. 03/26/2021: She continues on IV ceftriaxone and oral metronidazole. She has follow-up with infectious disease tomorrow. No significant issues or complaints today. Her mother continues to help her with her wound dressing, using iodoform packing strips into the tunnel and Dakin's to the open portion of the wound. 3/20; patient presents for follow-up. She continues to be on IV ceftriaxone in oral metronidazole. She has been using iodoform to the tunnel and Dakin's wet-to- dry to the open wound. She denies signs of infection. 3/27; patient presents for follow-up. She no longer has a PICC line. She has been using iodoform  to the tunnel and Dakin's wet-to-dry to the open wound. She reports improvement in wound healing. She denies signs of infection. 4/3; patient presents for follow-up. She states she has been using Hydrofera Blue to the open wound and iodoform packing to the tunnel without any issues. She denies signs of infection. 4/18; patient presents for follow-up. She saw infectious disease on 4/11. She has finished her oral antibiotics and completed a total of 6 weeks of antibiotics (this includes IV as well). No further antibiotics needed. She has been using Hydrofera Blue and iodoform packing. She states that the tunneled area has come in and the iodoform [name] not staying in place anymore. She has no issues or complaints today. She denies signs of infection. 4/24; patient presents for follow-up. She saw Dr. Carlene Coria, plastic surgery to discuss potential skin graft/substitute placement. At this time he thinks that the skin graft would likely not take. He [name] in agreement with trying a wound VAC. Patient has been using Hydrofera Blue dressing changes with no issues. She denies signs of infection. She reports improvement in wound healing. 5/1; patient presents for follow-up. Unfortunately patient did not have insurance when we ran [place] the [place]. There [name] an assistance program and we are trying to get this accommodated for the patient. In the meantime she has been using Hydrofera Blue without any issues. She denies signs of infection. 5/8; patient presents for follow-up. We have not heard back if pico [name] covered by her insurance. She has been using collagen to the wound bed over the past week. She denies signs of infection. 5/18; patient presents for follow-up. She has been using collagen to the wound bed without issues. Again we have not heard if pico [name] covered by her insurance. She has no issues or complaints today. 5/23; patient presents for follow-up. She has been using collagen to the wound bed. She has no issues  or complaints today. She obtained the wound VAC from [facility] and brought this in today. She denies signs of infection. 6/1; patient presents for follow-up. She has been using the wound VAC for the past week. She has had this changed twice since she was last here. She reports more maceration to the periwound. She denies signs of infection. 6/7; right TMA site. There are 2 wounds 0 separated by a bridge of healed tissue. The more lateral area has undermining. Both areas have healthy looking granulation at the base but  relative the size of the wound [name] fairly deep. There [name] no exposed bone no evidence of infection. Her wound VAC was put on hold last week because of surrounding skin maceration she has been using collagen this week. She has a modified shoe 6/12; patient presents for follow-up. Last week the wound VAC was reinitiated. She had no issues with the wound VAC itself. Today she has maceration again noted to the surrounding skin. She denies signs of infection. 6/27; patient presents for follow-up. She has been using Medihoney to the wound bed. We took a break from the wound VAC because the periwound was macerated. She still has some areas of maceration to the distal foot where there [name] a callus. She currently denies signs of infection. 7/11; patient presents for follow-up. She has been using Medihoney to the wound bed. She has developed some increased warmth and erythema to the lateral aspect of the right foot. She states this [name] occurred over the past week and there [name] increased pain. No drainage noted. 7/17; patient presents for follow-up. She has been using Medihoney to the wound bed. She completed her course of Bactrim. She reports improvement in symptoms. 7/24; Patient presents for follow up. She has been using Medihoney to the wound bed without issues. She completed another course of Bactrim. She reports improvement in her symptoms but still has some mild tenderness to the medial aspect of the  foot. 7/31; Patient presents for follow-up. She has been using Medihoney and Dakin's to the wound bed. She denies signs of infection. 8/14; patient presents for follow-up. She has been using Dakin's wet-to-dry packing strips to the right medial aspect of the amputation site and Medihoney to the anterior site. She has no issues or complaints today. She has started physical therapy. She denies signs of infection. 8/29; patient presents for follow-up. She has been using Dakin's wet-to-dry packing strips to the right medial aspect of the amputation site however this [name] becoming more difficult to place. She did report that she had increased redness and swelling to that site and developed some drainage. It has resolved. She continues with physical therapy. 9/8; patient presents for follow-up. We have been using silver alginate to the tunneled area. She completed her course of antibiotics. She reports no pain, increased swelling or erythema. 9/21; patient presents for follow-up. She has been using gentamicin to the tunneled area. She has no issues or complaints today. She denies signs of infection. 10/2; patient presents for follow-up. [name] scheduled to have her CT scan on 10/9. She currently denies signs of infection. She denies increased warmth, erythema or purulent drainage from the wound bed. She has been using Dakin's wet-to-dry dressings. 10/12; patient presents for follow-up. She had her CT scan on 10/9 that showed A small irregular rim-enhancing fluid pocket communicating to the overlying soft tissues of the sinus tract compatible with a small abscess. Currently she denies systemic signs of infection. She has been doing Dakin's wet-to-dry packing strips but it [name] hard for her to pack into the narrow opening. [name], [name] ([id number]) [id number] Page 7 of 10 10/30; patient presents for follow-up. Since last clinic visit she has had OR debridement of her Right foot by  Dr. Odis Hollingshead Due to abscess noted on CT. She has been using iodoform packing. 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 She will complete 2 weeks of this and continue with oral amoxicillin for the next 6 to 12 months. She follows with  Dr. Luciana Axe for this. She currently denies signs of infection. 11/13; patient presents for follow-up. Patient has been using silver alginate with gentamicin to the wound bed. She has no issues or complaints today she. She reports improvement in wound healing. Patient History Information obtained from Patient. Family History Cancer - Paternal Grandparents, Diabetes - Mother, Hypertension - Mother, Stroke - Maternal Grandparents, No family history of Heart Disease, Hereditary Spherocytosis, Kidney Disease, Lung Disease, Seizures, Thyroid Problems, Tuberculosis. Social History Never smoker, Marital Status - Single, Alcohol Use - Rarely, Drug Use - Prior History - Marijuana, Caffeine Use - Daily. Medical History Cardiovascular Patient has history of Hypertension Endocrine Patient has history of Type II Diabetes Musculoskeletal Patient has history of Osteomyelitis - Right Transmet 12/18/20 Neurologic Patient has history of Neuropathy Hospitalization/Surgery History - inpatient right foot abscess 10/12-10/18/2023. Objective Constitutional respirations regular, non-labored and within target range for patient.. Vitals Time Taken: 9:49 AM, Height: 69 in, Temperature: 98 F, Pulse: 83 bpm, Respiratory Rate: 20 breaths/min, Blood Pressure: 100/69 mmHg, Capillary Blood Glucose: 203 mg/dl. Psychiatric pleasant and cooperative. General Notes: Right foot: T the transmetatarsal amputation site there [name] an open wound with slough and granulation tissue with a tunnel to the medial aspect. o No increased warmth, erythema or purulent drainage noted. Integumentary (Hair, Skin) Wound #2 status [name] Open. Original cause of wound was  Surgical Injury. The date acquired was: 07/10/2021. The wound has been in treatment 19 weeks. The wound [name] located on the Right,Medial Amputation Site - Transmetatarsal. The wound measures 0.4cm length x 1.3cm width x 0.7cm depth; 0.408cm^2 area and 0.286cm^3 volume. There [name] Fat Layer (Subcutaneous Tissue) exposed. There [name] no tunneling noted, however, there [name] undermining starting at 4:00 and ending at 8:00 with a maximum distance of 1cm. There [name] a medium amount of serosanguineous drainage noted. The wound margin [name] distinct with the outline attached to the wound base. There [name] large (67-100%) pink, pale granulation within the wound bed. There [name] a small (1-33%) amount of necrotic tissue within the wound bed including Adherent Slough. The periwound skin appearance had no abnormalities noted for color. The periwound skin appearance exhibited: Callus. The periwound skin appearance did not exhibit: Crepitus, Excoriation, Induration, Rash, Scarring, Dry/Scaly, Maceration. Periwound temperature was noted as No Abnormality. Assessment Active Problems ICD-10 Non-pressure chronic ulcer of other part of right foot with necrosis of bone Type 2 diabetes mellitus with foot ulcer Osteomyelitis, unspecified Patient's wound appears well-healing. I debrided nonviable tissue. I recommended continue the course with silver alginate and gentamicin ointment. Continue aggressive offloading. Follow-up in 2 weeks. Procedures [name], [name] ([id number]) [id number] Page 8 of 10 Wound #2 Pre-procedure diagnosis of Wound #2 [name] a Diabetic Wound/Ulcer of the Lower Extremity located on the Right,Medial Amputation Site - Transmetatarsal .Severity of Tissue Pre Debridement [name]: Fat layer exposed. There was a Excisional Skin/Subcutaneous Tissue Debridement with a total area of 0.52 sq cm performed by Geralyn Corwin, DO. With the following instrument(s): Curette to remove Viable and Non-Viable  tissue/material. Material removed includes Callus, Subcutaneous Tissue, and Slough after achieving pain control using Lidocaine. No specimens were taken. A time out was conducted at 10:35, prior to the start of the procedure. A Minimum amount of bleeding was controlled with Pressure. The procedure was tolerated well with a pain level of 0 throughout and a pain level of 0 following the procedure. Post Debridement Measurements: 0.4cm length x 1.3cm width x 0.7cm depth; 0.286cm^3 volume. Character of Wound/Ulcer Post Debridement [name] improved. Severity of  Tissue Post Debridement [name]: Fat layer exposed. Post procedure Diagnosis Wound #2: Same as Pre-Procedure Plan Follow-up Appointments: Return Appointment in 2 weeks. - w/ Dr. Mikey Bussing Anesthetic: (In clinic) Topical Lidocaine 5% applied to wound bed (In clinic) Topical Lidocaine 4% applied to wound bed Bathing/ Shower/ Hygiene: Other Bathing/Shower/Hygiene Orders/Instructions: - Clean with Saline or Dakins Edema Control - Lymphedema / SCD / Other: Elevate legs to the level of the heart or above for 30 minutes daily and/or when sitting, a frequency of: - throughout the day Avoid standing for long periods of time. - Limit time/pressure on feet, reduce PT Use wheelchair instead of walker when you can. . Moisturize legs daily. Off-Loading: Open toe surgical shoe to: - surgical shoe: reduce pressure when walking Additional Orders / Instructions: Follow Nutritious Diet - -Monitor/Control Blood Sugar -High Protein Diet WOUND #2: - Amputation Site - Transmetatarsal Wound Laterality: Right, Medial Cleanser: Normal Saline (Generic) 1 x Per Day/30 Days Discharge Instructions: Cleanse the wound with Normal Saline prior to applying a clean dressing using gauze sponges, not tissue or cotton balls. Topical: Gentamicin 1 x Per Day/30 Days Discharge Instructions: As directed by physician Prim Dressing: KerraCel Ag Gelling Fiber Dressing, 4x5 in (silver alginate)  (Generic) 1 x Per Day/30 Days ary Discharge Instructions: Apply silver alginate to wound bed as instructed Secondary Dressing: Woven Gauze Sponge, Non-Sterile 4x4 in (Generic) 1 x Per Day/30 Days Discharge Instructions: Apply over primary dressing as directed. Secured With: Elastic Bandage 4 inch (ACE bandage) (Generic) 1 x Per Day/30 Days Discharge Instructions: Secure with ACE bandage as directed. Secured With: [employer], 4.5x3.1 (in/yd) (Generic) 1 x Per Day/30 Days Discharge Instructions: Secure with Kerlix as directed. 1. In office sharp debridement 2. Silver alginate and gentamicin ointment to the wound bed daily 3. Aggressive offloadingoosurgical shoe 4. Follow-up in 2 weeks Electronic Signature(s) Signed: 11/26/2021 1:13:56 PM By: Geralyn Corwin DO Entered By: Geralyn Corwin on 11/26/2021 12:55:17 -------------------------------------------------------------------------------- HxROS Details Patient Name: Date of Service: [name], [name]. 11/26/2021 9:30 A M Medical Record Number: [id number] Patient Account Number: [id number] Date of Birth/Sex: Treating RN: 1980-05-22 (41 y.o. F) Primary Care Provider: Gwinda Passe Other Clinician: Referring Provider: Treating Provider/Extender: Grace Isaac in Treatment: 26 Information Obtained From Patient Cardiovascular Medical History: Positive for: Hypertension [name], [name] ([id number]) [id number] Page 9 of 10 Endocrine Medical History: Positive for: Type II Diabetes Time with diabetes: Dx 2009 Treated with: Insulin, Oral agents Blood sugar tested every day: Yes Tested : daily Musculoskeletal Medical History: Positive for: Osteomyelitis - Right Transmet 12/18/20 Neurologic Medical History: Positive for: Neuropathy Immunizations Pneumococcal Vaccine: Received Pneumococcal Vaccination: No Implantable Devices Yes Hospitalization / Surgery  History Type of Hospitalization/Surgery inpatient right foot abscess 10/12-10/18/2023 Family and Social History Cancer: Yes - Paternal Grandparents; Diabetes: Yes - Mother; Heart Disease: No; Hereditary Spherocytosis: No; Hypertension: Yes - Mother; Kidney Disease: No; Lung Disease: No; Seizures: No; Stroke: Yes - Maternal Grandparents; Thyroid Problems: No; Tuberculosis: No; Never smoker; Marital Status - Single; Alcohol Use: Rarely; Drug Use: Prior History - Marijuana; Caffeine Use: Daily; Financial Concerns: No; Food, Clothing or Shelter Needs: No; Support System Lacking: No; Transportation Concerns: No Electronic Signature(s) Signed: 11/26/2021 1:13:56 PM By: Geralyn Corwin DO Entered By: Geralyn Corwin on 11/26/2021 12:45:37 -------------------------------------------------------------------------------- SuperBill Details Patient Name: Date of Service: [name], [name]. 11/26/2021 Medical Record Number: [id number] Patient Account Number: [id number] Date of Birth/Sex: Treating RN: 1980/02/22 (41 y.o. Toniann Fail Primary Care  Provider: Gwinda Passe Other Clinician: Referring Provider: Treating Provider/Extender: Grace Isaac in Treatment: 44 Diagnosis Coding ICD-10 Codes Code Description [phone] Non-pressure chronic ulcer of other part of right foot with necrosis of bone E11.621 Type 2 diabetes mellitus with foot ulcer M86.9 Osteomyelitis, unspecified Facility Procedures : [name], [name] CPT4 Code: [id number] [name] ([id number]) Description: 11042 - DEB SUBQ TISSUE 20 SQ CM/< ICD-10 Diagnosis Description L97.514 Non-pressure chronic ulcer of other part of right foot with necrosis of bone [id number] Modifier: [email or web address]. Quantity: 1 pdf Page 10 of 10 Physician Procedures : CPT4 Code Description Modifier [id number] 11042 - WC PHYS SUBQ TISS 20 SQ CM ICD-10 Diagnosis Description L97.514 Non-pressure chronic ulcer of other part of  right foot with necrosis of bone Quantity: 1 Electronic Signature(s) Signed: 11/26/2021 1:13:56 PM By: Geralyn Corwin DO Entered By: Geralyn Corwin on 11/26/2021 12:55:27

## 2021-11-26 NOTE — Progress Notes (Unsigned)
[facility]   Subjective:   [name] is a 41 y.o. morbid obese female presents for hospital follow up admitted on  10/25/21, she was seen by wound care which encouraged her to go to the emergency room for her right foot pain. CT of right foot with contrast done on 10/22/2021:  suggestive of osteomyelitis. The wound care center  saw patient on 10/25/21 and called the surgeon and was told to go to the hospital.  Today, depressed cry unable to not replay how, why this has happened to me. Refuses medication or counseling.  Past Medical History:  Diagnosis Date   Class 3 obesity ([place]) 12/16/2020   Diabetes mellitus      No Known Allergies    Current Outpatient Medications on File Prior to Visit  Medication Sig Dispense Refill   amoxicillin (AMOXIL) 500 MG capsule Take 1 capsule (500 mg total) by mouth 3 (three) times daily. 90 capsule 5   apixaban (ELIQUIS) 5 MG TABS tablet Take 1 tablet (42m) by mouth twice daily. 180 tablet 1   atorvastatin (LIPITOR) 80 MG tablet Take 1 tablet (80 mg total) by mouth once daily. 90 tablet 3   Blood Glucose Monitoring Suppl (TRUE METRIX METER) w/Device KIT Use to check blood sugar twice a day. 1 kit 0   Dulaglutide (TRULICITY) 1.5 MFK/8.1EXSOPN Inject 1.5 mg into the skin once a week. 2 mL 2   empagliflozin (JARDIANCE) 10 MG TABS tablet Take 1 tablet (10 mg total) by mouth daily. 30 tablet 2   gabapentin (NEURONTIN) 300 MG capsule Take 2 capsules by mouth 3 times daily. 180 capsule 2   gentamicin ointment (GARAMYCIN) 0.1 % Apply 1 Application topically daily. 30 g 0   glucose blood (TRUE METRIX BLOOD GLUCOSE TEST) test strip Use to check blood sugar twice a day. 100 each 2   insulin glargine (LANTUS SOLOSTAR) 100 UNIT/ML Solostar Pen Inject 70 Units into the skin daily. 15 mL 3   Insulin Pen Needle 32G X 4 MM MISC use as directed 100 each 2   lisinopril (ZESTRIL) 2.5 MG tablet Take 1 tablet (2.5 mg total) by mouth daily. 90 tablet 0    metFORMIN (GLUCOPHAGE-XR) 500 MG 24 hr tablet Take 2 tablets (1,000 mg total) by mouth 2 (two) times daily. 120 tablet 2   oxyCODONE (OXY IR/ROXICODONE) 5 MG immediate release tablet Take 5 mg by mouth every 6 (six) hours as needed for moderate pain or severe pain.     TRUEplus Lancets 28G MISC Use to check blood sugar twice a day. 100 each 2   No current facility-administered medications on file prior to visit.   Review of System: Comprehensive ROS Pertinent positive and negative noted in HPI    Objective:  BP 106/71   Pulse 92   Resp 16   LMP 10/31/2021   SpO2 100%   Physical Exam: General Appearance: Well nourished, in no apparent distress. Eyes: PERRLA, EOMs, conjunctiva no swelling or erythema Sinuses: No Frontal/maxillary tenderness ENT/Mouth: Ext aud canals clear, TMs without erythema, bulging. No erythema, swelling, or exudate on post pharynx.  Tonsils not swollen or erythematous. Hearing normal.  Neck: Supple, thyroid normal.  Respiratory: Respiratory effort normal, BS equal bilaterally without rales, rhonchi, wheezing or stridor.  Cardio: RRR with no MRGs. Brisk peripheral pulses without edema.  Abdomen: Soft, + BS.  Non tender, no guarding, rebound, hernias, masses. Lymphatics: Non tender without lymphadenopathy.  Musculoskeletal: Full ROM, 5/5 strength, normal gait.  Skin: Warm, dry without rashes, lesions, ecchymosis.  Neuro: Cranial nerves intact. Normal muscle tone, no cerebellar symptoms. Sensation intact.  Psych: Awake and oriented X 3, normal affect, Insight and Judgment appropriate.    Assessment:   [name] was seen today for hospitalization follow-up.  Diagnoses and all orders for this visit:  Type 2 diabetes mellitus with hyperglycemia, unspecified whether long term insulin use ([place]) - educated on lifestyle modifications, including but not limited to diet choices and adding exercise to daily routine.    Mixed hyperlipidemia  Healthy lifestyle diet of  fruits vegetables fish nuts whole grains and low saturated fat . Foods high in cholesterol or liver, fatty meats,cheese, butter avocados, nuts and seeds, chocolate and fried foods.  Essential hypertension BP goal  at goal  < 130/80 Explained that having normal blood pressure is the goal and medications are helping to get to goal and maintain normal blood pressure. DIET: Limit salt intake, read nutrition labels to check salt content, limit fried and high fatty foods  Avoid using multisymptom OTC cold preparations that generally contain sudafed which can rise BP. Consult with pharmacist on best cold relief products to use for persons with HTN EXERCISE Discussed incorporating exercise such as walking - 30 minutes most days of the week and can do in 10 minute intervals     Hospital discharge follow-up Followed by ID- Dr. Linus Salmons, and orthopedic for osteomyelitis   This note has been created with Dragon speech recognition software and smart phrase technology. Any transcriptional errors are unintentional.   Kerin Perna, NP 11/26/2021, 3:29 PM

## 2021-11-28 NOTE — Progress Notes (Signed)
CHARISSA, KNOWLES ([id number]) [id number] Page 1 of 8 Visit Report for 11/26/2021 Arrival Information Details Patient Name: Date of Service: [name], [name]. 11/26/2021 9:30 A M Medical Record Number: [id number] Patient Account Number: [id number] Date of Birth/Sex: Treating RN: [birth year] (41 y.o. Helene Shoe, Meta.Reding Primary Care Reinhold Rickey: Juluis Mire Other Clinician: Referring Donna Snooks: Treating Jazon Jipson/Extender: Edmonia Lynch in Treatment: 54 Visit Information History Since Last Visit Added or deleted any medications: No Patient Arrived: Wheel Chair Any new allergies or adverse reactions: No Arrival Time: 09:48 Had a fall or experienced change in No Accompanied By: mother activities of daily living that may affect Transfer Assistance: None risk of falls: Patient Identification Verified: Yes Signs or symptoms of abuse/neglect since last visito No Secondary Verification Process Completed: Yes Hospitalized since last visit: No Patient Requires Transmission-Based No Implantable device outside of the clinic excluding No Precautions: cellular tissue based products placed in the center Patient Has Alerts: Yes since last visit: Patient Alerts: Patient on Blood Thinner Has Dressing in Place as Prescribed: Yes PICC R Arm Pain Present Now: No ABI 12/17/20 R=1.08 [name]=1.13 Electronic Signature(s) Signed: 11/27/2021 5:53:09 PM By: [name] RN, BSN Entered By: [name] on 11/26/2021 09:49:34 -------------------------------------------------------------------------------- Encounter Discharge Information Details Patient Name: Date of Service: [name] V IS, [name]. 11/26/2021 9:30 A M Medical Record Number: [id number] Patient Account Number: [id number] Date of Birth/Sex: Treating RN: Nov 20, 1980 (41 y.o. [name], [name] Primary Care Moni Rothrock: Juluis Mire Other Clinician: Referring Barkley Kratochvil: Treating  Kaysia Willard/Extender: Edmonia Lynch in Treatment: 1 Encounter Discharge Information Items Post Procedure Vitals Discharge Condition: Stable Temperature (F): 98.1 Ambulatory Status: Wheelchair Pulse (bpm): 74 Discharge Destination: Home Respiratory Rate (breaths/min): 17 Transportation: Private Auto Blood Pressure (mmHg): 136/77 Accompanied By: mom Schedule Follow-up Appointment: Yes Clinical Summary of Care: Patient Declined Electronic Signature(s) Signed: 11/26/2021 4:10:29 PM By: Rhae Hammock RN Entered By: Rhae Hammock on 11/26/2021 10:41:37 [name], [name] ([id number]) [id number] Page 2 of 8 -------------------------------------------------------------------------------- Lower Extremity Assessment Details Patient Name: Date of Service: [name] Pascal IS, [name]. 11/26/2021 9:30 A M Medical Record Number: [id number] Patient Account Number: [id number] Date of Birth/Sex: Treating RN: 05-03-1980 (41 y.o. [name] Primary Care Verla Bryngelson: Juluis Mire Other Clinician: Referring Lucianna Ostlund: Treating Alleene Stoy/Extender: Edmonia Lynch in Treatment: 44 Edema Assessment Assessed: [Left: No] [Right: Yes] Edema: [Left: Ye] [Right: s] Calf Left: Right: Point of Measurement: 34 cm From Medial Instep 49 cm Ankle Left: Right: Point of Measurement: 8 cm From Medial Instep 23 cm Vascular Assessment Pulses: Dorsalis Pedis Palpable: [Right:Yes] Electronic Signature(s) Signed: 11/27/2021 5:53:09 PM By: [name] RN, BSN Entered By: [name] on 11/26/2021 09:50:07 -------------------------------------------------------------------------------- Multi Wound Chart Details Patient Name: Date of Service: [name] V IS, [name]. 11/26/2021 9:30 A M Medical Record Number: [id number] Patient Account Number: [id number] Date of Birth/Sex: Treating RN: 06/17/1980 (41 y.o. F) Primary Care Abem Shaddix:  Juluis Mire Other Clinician: Referring Jefferson Fullam: Treating Radek Carnero/Extender: Edmonia Lynch in Treatment: 44 Vital Signs Height(in): 67 Capillary Blood Glucose(mg/dl): 203 Weight(lbs): Pulse(bpm): 75 Body Mass Index(BMI): Blood Pressure(mmHg): 100/69 Temperature(F): 98 Respiratory Rate(breaths/min): 20 [2:Photos:] [N/A:N/A] Right, Medial Amputation Site - N/A N/A Wound Location: Transmetatarsal Surgical Injury N/A N/A Wounding Event: Diabetic Wound/Ulcer of the Lower N/A N/A Primary Etiology: Extremity Open Surgical Wound N/A N/A Secondary Etiology: Hypertension, Type II Diabetes, N/A N/A Comorbid History: Osteomyelitis, Neuropathy 07/10/2021 N/A N/A Date Acquired: 76 N/A N/A Weeks of Treatment: Open  N/A N/A Wound Status: No N/A N/A Wound Recurrence: 0.4x1.3x0.7 N/A N/A Measurements [name] x W x D (cm) 0.408 N/A N/A A (cm) : rea 0.286 N/A N/A Volume (cm) : -245.80% N/A N/A % Reduction in A rea: -717.10% N/A N/A % Reduction in Volume: 4 Starting Position 1 (o'clock): 8 Ending Position 1 (o'clock): 1 Maximum Distance 1 (cm): Yes N/A N/A Undermining: Grade 3 N/A N/A Classification: Abscess N/A N/A [name] Verification: Medium N/A N/A Exudate A mount: Serosanguineous N/A N/A Exudate Type: red, brown N/A N/A Exudate Color: Distinct, outline attached N/A N/A Wound Margin: Large (67-100%) N/A N/A Granulation A mount: Pink, Pale N/A N/A Granulation Quality: Small (1-33%) N/A N/A Necrotic A mount: Fat Layer (Subcutaneous Tissue): Yes N/A N/A Exposed Structures: Fascia: No Tendon: No Muscle: No Joint: No Bone: No None N/A N/A Epithelialization: Debridement - Excisional N/A N/A Debridement: Pre-procedure Verification/Time Out 10:35 N/A N/A Taken: Lidocaine N/A N/A Pain Control: Callus, Subcutaneous, Slough N/A N/A Tissue Debrided: Skin/Subcutaneous Tissue N/A N/A Level: 0.52 N/A N/A Debridement A (sq  cm): rea Curette N/A N/A Instrument: Minimum N/A N/A Bleeding: Pressure N/A N/A Hemostasis A chieved: 0 N/A N/A Procedural Pain: 0 N/A N/A Post Procedural Pain: Procedure was tolerated well N/A N/A Debridement Treatment Response: 0.4x1.3x0.7 N/A N/A Post Debridement Measurements [name] x W x D (cm) 0.286 N/A N/A Post Debridement Volume: (cm) Callus: Yes N/A N/A Periwound Skin Texture: Excoriation: No Induration: No Crepitus: No Rash: No Scarring: No Maceration: No N/A N/A Periwound Skin Moisture: Dry/Scaly: No Atrophie Blanche: No N/A N/A Periwound Skin Color: Cyanosis: No Ecchymosis: No Erythema: No Hemosiderin Staining: No Mottled: No Pallor: No Rubor: No No Abnormality N/A N/A Temperature: Debridement N/A N/A Procedures Performed: Treatment Notes Wound #2 (Amputation Site - Transmetatarsal) Wound Laterality: Right, Medial Cleanser Normal Saline Discharge Instruction: Cleanse the wound with Normal Saline prior to applying a clean dressing using gauze sponges, not tissue or cotton balls. Peri-Wound Care Topical Gentamicin Mcadams, [name] ([id number]) [id number] Page 4 of 8 Discharge Instruction: As directed by physician Primary Dressing KerraCel Ag Gelling Fiber Dressing, 4x5 in (silver alginate) Discharge Instruction: Apply silver alginate to wound bed as instructed Secondary Dressing Woven Gauze Sponge, Non-Sterile 4x4 in Discharge Instruction: Apply over primary dressing as directed. Secured With Elastic Bandage 4 inch (ACE bandage) Discharge Instruction: Secure with ACE bandage as directed. Kerlix Roll Sterile, 4.5x3.1 (in/yd) Discharge Instruction: Secure with Kerlix as directed. Compression Wrap Compression Stockings Add-Ons Electronic Signature(s) Signed: 11/26/2021 1:13:56 PM By: Kalman Shan DO Entered By: Kalman Shan on 11/26/2021  12:41:31 -------------------------------------------------------------------------------- Multi-Disciplinary Care Plan Details Patient Name: Date of Service: [name] V IS, [name]. 11/26/2021 9:30 A M Medical Record Number: [id number] Patient Account Number: [id number] Date of Birth/Sex: Treating RN: [birth year] (41 y.o. [name], [name] Primary Care Zyriah Mask: Juluis Mire Other Clinician: Referring Armonte Tortorella: Treating Alok Minshall/Extender: Edmonia Lynch in Treatment: 85 Active Inactive Nutrition Nursing Diagnoses: Impaired glucose control: actual or potential Goals: Patient/caregiver verbalizes understanding of need to maintain therapeutic glucose control per primary care physician Date Initiated: 01/22/2021 Target Resolution Date: 12/15/2021 Goal Status: Active Interventions: Assess HgA1c results as ordered upon admission and as needed Provide education on elevated blood sugars and impact on wound healing Treatment Activities: Obtain HgA1c : 01/22/2021 Notes: 07/10/21: Glucose control ongoing 09/11/21 : Glucose control ongoing, patient not compliant in checking glucose. Wound/Skin Impairment Nursing Diagnoses: Impaired tissue integrity Goals: Patient/caregiver will verbalize understanding of skin care regimen Date Initiated: 01/22/2021 Target Resolution Date: 12/15/2021 [name],  [name] ([id number]) [id number] Page 5 of 8 Goal Status: Active Ulcer/skin breakdown will have a volume reduction of 30% by week 4 Date Initiated: 01/22/2021 Date Inactivated: 03/19/2021 Target Resolution Date: 03/16/2021 Goal Status: Met Ulcer/skin breakdown will have a volume reduction of 50% by week 8 Date Initiated: 03/19/2021 Date Inactivated: 05/14/2021 Target Resolution Date: 04/16/2021 Unmet Reason: see wound Goal Status: Unmet measurements Interventions: Assess patient/caregiver ability to obtain necessary supplies Assess patient/caregiver ability to  perform ulcer/skin care regimen upon admission and as needed Assess ulceration(s) every visit Provide education on ulcer and skin care Treatment Activities: Topical wound management initiated : 01/22/2021 Notes: 06/04/21: Wound vac started 07/10/21: Wound care regimen continues Electronic Signature(s) Signed: 11/26/2021 4:10:29 PM By: Rhae Hammock RN Entered By: Rhae Hammock on 11/26/2021 10:36:12 -------------------------------------------------------------------------------- Pain Assessment Details Patient Name: Date of Service: [name] V IS, [name]. 11/26/2021 9:30 A M Medical Record Number: [id number] Patient Account Number: [id number] Date of Birth/Sex: Treating RN: 02-May-1980 (41 y.o. [name] Primary Care Sigmond Patalano: Juluis Mire Other Clinician: Referring Petrita Blunck: Treating Kiwan Gadsden/Extender: Edmonia Lynch in Treatment: 87 Active Problems Location of Pain Severity and Description of Pain Patient Has Paino No Site Locations Rate the pain. Current Pain Level: 0 Pain Management and Medication Current Pain Management: Medication: No Cold Application: No Rest: No Massage: No Activity: No T.E.N.S.: No Heat Application: No Leg drop or elevation: No [name] the Current Pain Management Adequate: Adequate How does your wound impact your activities of daily livingo Sleep: No Bathing: No [name], [name] ([id number]) [id number] Page 6 of 8 Appetite: No Relationship With Others: No Bladder Continence: No Emotions: No Bowel Continence: No Work: No Toileting: No Drive: No Dressing: No Hobbies: No Engineer, maintenance) Signed: 11/27/2021 5:53:09 PM By: [name] RN, BSN Entered By: [name] on 11/26/2021 09:49:59 -------------------------------------------------------------------------------- Patient/Caregiver Education Details Patient Name: Date of Service: [name] [name], [name].  11/13/2023andnbsp9:30 A M Medical Record Number: [id number] Patient Account Number: [id number] Date of Birth/Gender: Treating RN: [birth year] (41 y.o. [name], [name] Primary Care Physician: Juluis Mire Other Clinician: Referring Physician: Treating Physician/Extender: Edmonia Lynch in Treatment: 59 Education Assessment Education Provided To: Patient Education Topics Provided Elevated Blood Sugar/ Impact on Healing: Methods: Explain/Verbal Responses: State content correctly Wound/Skin Impairment: Methods: Explain/Verbal Responses: State content correctly Electronic Signature(s) Signed: 11/26/2021 4:10:29 PM By: Rhae Hammock RN Entered By: Rhae Hammock on 11/26/2021 10:37:03 -------------------------------------------------------------------------------- Wound Assessment Details Patient Name: Date of Service: [name] V IS, [name]. 11/26/2021 9:30 A M Medical Record Number: [id number] Patient Account Number: [id number] Date of Birth/Sex: Treating RN: 1980/09/01 (41 y.o. 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, Medial Amputation Site - Transmetatarsal Secondary Open Surgical Wound Etiology: Wounding Event: Surgical Injury Wound Status: Open Date Acquired: 07/10/2021 Comorbid History: Hypertension, Type II Diabetes, Osteomyelitis, Weeks Of Treatment: 19 Neuropathy Clustered Wound: No Photos [name], [name] ([id number]) [id number] Page 7 of 8 Wound Measurements Length: (cm) 0.4 Width: (cm) 1.3 Depth: (cm) 0.7 Area: (cm) 0.408 Volume: (cm) 0.286 % Reduction in Area: -245.8% % Reduction in Volume: -717.1% Epithelialization: None Tunneling:  No Undermining: Yes Starting Position (o'clock): 4 Ending Position (o'clock): 8 Maximum Distance: (cm) 1 Wound Description Classification: Grade 3 Wagner Verification: Abscess Wound Margin: Distinct, outline attached Exudate Amount: Medium Exudate Type: Serosanguineous  Exudate Color: red, brown Foul Odor After Cleansing: No Slough/Fibrino Yes Wound Bed Granulation Amount: Large (67-100%) Exposed Structure Granulation Quality: Pink, Pale Fascia Exposed: No Necrotic Amount: Small (1-33%) Fat Layer (Subcutaneous Tissue) Exposed: Yes Necrotic Quality: Adherent Slough Tendon Exposed: No Muscle Exposed: No Joint Exposed: No Bone Exposed: No Periwound Skin Texture Texture Color No Abnormalities Noted: No No Abnormalities Noted: Yes Callus: Yes Temperature / Pain Crepitus: No Temperature: No Abnormality Excoriation: No Induration: No Rash: No Scarring: No Moisture No Abnormalities Noted: No Dry / Scaly: No Maceration: No Treatment Notes Wound #2 (Amputation Site - Transmetatarsal) Wound Laterality: Right, Medial Cleanser Normal Saline Discharge Instruction: Cleanse the wound with Normal Saline prior to applying a clean dressing using gauze sponges, not tissue or cotton balls. Peri-Wound Care Topical Gentamicin Discharge Instruction: As directed by physician Primary Dressing KerraCel Ag Gelling Fiber Dressing, 4x5 in (silver alginate) Discharge Instruction: Apply silver alginate to wound bed as instructed Secondary Dressing [name], [name] ([id number]) [id number] Page 8 of 8 Woven Gauze Sponge, Non-Sterile 4x4 in Discharge Instruction: Apply over primary dressing as directed. Secured With Elastic Bandage 4 inch (ACE bandage) Discharge Instruction: Secure with ACE bandage as directed. Kerlix Roll Sterile, 4.5x3.1 (in/yd) Discharge Instruction: Secure with Kerlix as directed. Compression Wrap Compression Stockings Add-Ons Electronic  Signature(s) Signed: 11/27/2021 5:53:09 PM By: [name] RN, BSN Entered By: [name] on 11/26/2021 10:26:51 -------------------------------------------------------------------------------- Vitals Details Patient Name: Date of Service: [name] V IS, [name]. 11/26/2021 9:30 A M Medical Record Number: [id number] Patient Account Number: [id number] Date of Birth/Sex: Treating RN: [birth year] (41 y.o. Helene Shoe, Tammi Klippel Primary Care Hubert Raatz: Juluis Mire Other Clinician: Referring Kvion Shapley: Treating Bernyce Brimley/Extender: Edmonia Lynch in Treatment: 44 Vital Signs Time Taken: 09:49 Temperature (F): 98 Height (in): 69 Pulse (bpm): 83 Respiratory Rate (breaths/min): 20 Blood Pressure (mmHg): 100/69 Capillary Blood Glucose (mg/dl): 203 Reference Range: 80 - 120 mg / dl Electronic Signature(s) Signed: 11/27/2021 5:53:09 PM By: [name] RN, BSN Entered By: [name] on 11/26/2021 09:49:48

## 2021-12-10 ENCOUNTER — Encounter (HOSPITAL_BASED_OUTPATIENT_CLINIC_OR_DEPARTMENT_OTHER): Payer: Medicaid Other | Admitting: Internal Medicine

## 2021-12-17 ENCOUNTER — Encounter (HOSPITAL_BASED_OUTPATIENT_CLINIC_OR_DEPARTMENT_OTHER): Payer: Medicaid Other | Attending: Internal Medicine | Admitting: Internal Medicine

## 2021-12-17 DIAGNOSIS — Z794 Long term (current) use of insulin: Secondary | ICD-10-CM | POA: Insufficient documentation

## 2021-12-17 DIAGNOSIS — E11621 Type 2 diabetes mellitus with foot ulcer: Secondary | ICD-10-CM | POA: Diagnosis present

## 2021-12-17 DIAGNOSIS — Z89431 Acquired absence of right foot: Secondary | ICD-10-CM | POA: Diagnosis not present

## 2021-12-17 DIAGNOSIS — L97514 Non-pressure chronic ulcer of other part of right foot with necrosis of bone: Secondary | ICD-10-CM | POA: Insufficient documentation

## 2021-12-17 DIAGNOSIS — E1140 Type 2 diabetes mellitus with diabetic neuropathy, unspecified: Secondary | ICD-10-CM | POA: Insufficient documentation

## 2021-12-17 DIAGNOSIS — I10 Essential (primary) hypertension: Secondary | ICD-10-CM | POA: Insufficient documentation

## 2021-12-17 DIAGNOSIS — M869 Osteomyelitis, unspecified: Secondary | ICD-10-CM | POA: Diagnosis not present

## 2021-12-17 DIAGNOSIS — Z7901 Long term (current) use of anticoagulants: Secondary | ICD-10-CM | POA: Insufficient documentation

## 2021-12-17 NOTE — Progress Notes (Signed)
[name] ([id number]) [id number] Page 1 of 10 Visit Report for 12/17/2021 Chief Complaint Document Details Patient Name: Date of Service: [name] IS, [place] [name]. 12/17/2021 11:00 A M Medical Record Number: [id number] Patient Account Number: [id number] Date of Birth/Sex: Treating RN: 09-11-1980 (41 y.o. F) Primary Care Provider: Gwinda [name] Other Clinician: Referring Provider: Treating Provider/Extender: [name] in Treatment: 36 Information Obtained from: Patient Chief Complaint Osteomyelitis of the right foot status post transmetatarsal amputation with surgical site dehiscence Electronic Signature(s) Signed: 12/17/2021 12:06:00 PM By: [name] Corwin DO Entered By: [name] on 12/17/2021 12:01:54 -------------------------------------------------------------------------------- Debridement Details Patient Name: Date of Service: [name] [name]. 12/17/2021 11:00 A M Medical Record Number: [id number] Patient Account Number: [id number] Date of Birth/Sex: Treating RN: [birth year] (41 y.o. [name] Primary Care Provider: Gwinda [name] Other Clinician: Referring Provider: Treating Provider/Extender: [name] in Treatment: 47 Debridement Performed for Assessment: Wound #2 Right,Medial Amputation Site - Transmetatarsal Performed By: Physician [name] Corwin, DO Debridement Type: Debridement Severity of Tissue Pre Debridement: Fat layer exposed Level of Consciousness (Pre-procedure): Awake and Alert Pre-procedure Verification/Time Out Yes - 11:44 Taken: Start Time: 11:44 Pain Control: Lidocaine T Area Debrided ([name] x W): otal 0.3 (cm) x 1.5 (cm) = 0.45 (cm) Tissue and other material debrided: Viable, Non-Viable, Callus, Slough, Subcutaneous, Slough Level: Skin/Subcutaneous Tissue Debridement Description: Excisional Instrument: Curette Bleeding:  Minimum Hemostasis Achieved: Pressure End Time: 11:44 Procedural Pain: 0 Post Procedural Pain: 0 Response to Treatment: Procedure was tolerated well Level of Consciousness (Post- Awake and Alert procedure): Post Debridement Measurements of Total Wound Length: (cm) 0.3 Width: (cm) 1.5 Depth: (cm) 0.6 Volume: (cm) 0.212 Character of Wound/Ulcer Post Debridement: Improved Severity of Tissue Post Debridement: Fat layer exposed [place], [name] ([id number]) [id number] Page 2 of 10 Post Procedure Diagnosis Same as Pre-procedure Electronic Signature(s) Signed: 12/17/2021 12:06:00 PM By: [name] Corwin DO Signed: 12/17/2021 3:46:46 PM By: [name] Mu RN Entered By: [name] on 12/17/2021 11:45:19 -------------------------------------------------------------------------------- HPI Details Patient Name: Date of Service: [name] [name]. 12/17/2021 11:00 A M Medical Record Number: [id number] Patient Account Number: [id number] Date of Birth/Sex: Treating RN: 02/13/1980 (41 y.o. F) Primary Care Provider: Gwinda [name] Other Clinician: Referring Provider: Treating Provider/Extender: [name] in Treatment: 63 History of Present Illness HPI Description: Admission 01/22/2021 [name] is a 41 year old female with a past medical history of insulin-dependent uncontrolled type 2 diabetes with last hemoglobin A1c of 13.5, osteomyelitis of the right foot status post transmetatarsal amputation on 12/18/2020 that presents to the clinic for right foot wound. She has had dehiscence of the surgical site. She is currently using wet-to-dry dressings. She has a PICC line and receiving IV ceftriaxone daily for her osteomyelitis. There is an end date of 01/27/2021. She is also taking oral metronidazole. She currently denies systemic signs of infection. 1/19; patient presents for follow-up. She was diagnosed with a DVT to the  right lower extremity 2 days ago. She is on Eliquis now. She is scheduled to see her infectious disease doctor tomorrow. She has been using Dakin's wet-to-dry dressings. She denies systemic signs of infection. 1/26; patient presents for follow-up. She saw infectious disease on 1/21 started on Augmentin. Her PICC line and IV ceftriaxone was discontinued. Patient reports stability to her wound. She has been using Dakin's wet-to-dry dressings. She currently denies systemic signs of infection. 2/3; patient presents for follow-up. She continues to use Dakin's wet-to-dry dressings  to the wound bed. She saw [name] with infectious disease yesterday and is continuing Augmentin. T entative end date is 2/16. Patient reports following up with orthopedics. She states there is no further plan from them. She currently denies systemic signs of infection. 2/10; patient presents for follow-up. She continues to use Dakin's wet to dry dressings. She is scheduled to have her MRI done on 2/14. She states that she had pain to the debridement site from last clinic visit and declines debridement today. She denies systemic signs of infection. She continues to have yellow thick drainage. 2/20; patient presents for follow-up. She continues to use Dakin's wet-to-dry dressings. She obtained her MRI. The results showed an abscess and she is scheduled to see her orthopedic surgeon on 2/23. She saw infectious disease 2/17 and her antibiotics were extended. She currently denies systemic signs of infection. 3/6; patient presents for follow-up. She had debridement and irrigation of her foot on 03/10/2021 due to abscess noted on MRI. She was started on IV ceftriaxone and oral Flagyl. She has no issues or complaints today. She has been using iodoform packing to the tunnel and Dakin's wet-to-dry to the opening. 03/26/2021: She continues on IV ceftriaxone and oral metronidazole. She has follow-up with infectious disease tomorrow. No  significant issues or complaints today. Her mother continues to help her with her wound dressing, using iodoform packing strips into the tunnel and Dakin's to the open portion of the wound. 3/20; patient presents for follow-up. She continues to be on IV ceftriaxone in oral metronidazole. She has been using iodoform to the tunnel and Dakin's wet-to- dry to the open wound. She denies signs of infection. 3/27; patient presents for follow-up. She no longer has a PICC line. She has been using iodoform to the tunnel and Dakin's wet-to-dry to the open wound. She reports improvement in wound healing. She denies signs of infection. 4/3; patient presents for follow-up. She states she has been using Hydrofera Blue to the open wound and iodoform packing to the tunnel without any issues. She denies signs of infection. 4/18; patient presents for follow-up. She saw infectious disease on 4/11. She has finished her oral antibiotics and completed a total of 6 weeks of antibiotics (this includes IV as well). No further antibiotics needed. She has been using Hydrofera Blue and iodoform packing. She states that the tunneled area has come in and the iodoform is not staying in place anymore. She has no issues or complaints today. She denies signs of infection. 4/24; patient presents for follow-up. She saw Dr. Carlene Coria, plastic surgery to discuss potential skin graft/substitute placement. At this time he thinks that the skin graft would likely not take. He is in agreement with trying a wound VAC. Patient has been using Hydrofera Blue dressing changes with no issues. She denies signs of infection. She reports improvement in wound healing. 5/1; patient presents for follow-up. Unfortunately patient did not have insurance when we ran for the pico. There is an assistance program and we are trying to get this accommodated for the patient. In the meantime she has been using Hydrofera Blue without any issues. She denies signs of  infection. 5/8; patient presents for follow-up. We have not heard back if pico is covered by her insurance. She has been using collagen to the wound bed over the past week. She denies signs of infection. 5/18; patient presents for follow-up. She has been using collagen to the wound bed without issues. Again we have not heard if pico is covered  by her insurance. She has no issues or complaints today. [name], [name] ([id number]) [id number] Page 3 of 10 5/23; patient presents for follow-up. She has been using collagen to the wound bed. She has no issues or complaints today. She obtained the wound VAC [facility] and brought this in today. She denies signs of infection. 6/1; patient presents for follow-up. She has been using the wound VAC for the past week. She has had this changed twice since she was last here. She reports more maceration to the periwound. She denies signs of infection. 6/7; right TMA site. There are 2 wounds 0 separated by a bridge of healed tissue. The more lateral area has undermining. Both areas have healthy looking granulation at the base but relative the size of the wound is fairly deep. There is no exposed bone no evidence of infection. Her wound VAC was put on hold last week because of surrounding skin maceration she has been using collagen this week. She has a modified shoe 6/12; patient presents for follow-up. Last week the wound VAC was reinitiated. She had no issues with the wound VAC itself. Today she has maceration again noted to the surrounding skin. She denies signs of infection. 6/27; patient presents for follow-up. She has been using Medihoney to the wound bed. We took a break from the wound VAC because the periwound was macerated. She still has some areas of maceration to the distal foot where there is a callus. She currently denies signs of infection. 7/11; patient presents for follow-up. She has been using Medihoney to the wound bed. She  has developed some increased warmth and erythema to the lateral aspect of the right foot. She states this is occurred over the past week and there is increased pain. No drainage noted. 7/17; patient presents for follow-up. She has been using Medihoney to the wound bed. She completed her course of Bactrim. She reports improvement in symptoms. 7/24; Patient presents for follow up. She has been using Medihoney to the wound bed without issues. She completed another course of Bactrim. She reports improvement in her symptoms but still has some mild tenderness to the medial aspect of the foot. 7/31; Patient presents for follow-up. She has been using Medihoney and Dakin's to the wound bed. She denies signs of infection. 8/14; patient presents for follow-up. She has been using Dakin's wet-to-dry packing strips to the right medial aspect of the amputation site and Medihoney to the anterior site. She has no issues or complaints today. She has started physical therapy. She denies signs of infection. 8/29; patient presents for follow-up. She has been using Dakin's wet-to-dry packing strips to the right medial aspect of the amputation site however this is becoming more difficult to place. She did report that she had increased redness and swelling to that site and developed some drainage. It has resolved. She continues with physical therapy. 9/8; patient presents for follow-up. We have been using silver alginate to the tunneled area. She completed her course of antibiotics. She reports no pain, increased swelling or erythema. 9/21; patient presents for follow-up. She has been using gentamicin to the tunneled area. She has no issues or complaints today. She denies signs of infection. 10/2; patient presents for follow-up. She is scheduled to have her CT scan on 10/9. She currently denies signs of infection. She denies increased warmth, erythema or purulent drainage from the wound bed. She has been using Dakin's  wet-to-dry dressings. 10/12; patient presents for follow-up. She had her CT scan on  10/9 that showed A small irregular rim-enhancing fluid pocket communicating to the overlying soft tissues of the sinus tract compatible with a small abscess. Currently she denies systemic signs of infection. She has been doing Dakin's wet-to-dry packing strips but it is hard for her to pack into the narrow opening. 10/30; patient presents for follow-up. Since last clinic visit she has had OR debridement of her Right foot by Dr. Odis Hollingshead Due to abscess noted on CT. She has been using iodoform packing. 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 She will complete 2 weeks of this and continue with oral amoxicillin for the next 6 to 12 months. She follows with Dr. Luciana Axe for this. She currently denies signs of infection. 11/13; patient presents for follow-up. Patient has been using silver alginate with gentamicin to the wound bed. She has no issues or complaints today she. She reports improvement in wound healing. 12/4; patient presents for follow-up. She has been using silver alginate and gentamicin to the wound bed. She has no issues or complaints today. Electronic Signature(s) Signed: 12/17/2021 12:06:00 PM By: [name] Corwin DO Entered By: [name] on 12/17/2021 12:03:43 -------------------------------------------------------------------------------- Physical Exam Details Patient Name: Date of Service: [name] [name]. 12/17/2021 11:00 A M Medical Record Number: [id number] Patient Account Number: [id number] Date of Birth/Sex: Treating RN: Feb 11, 1980 (41 y.o. F) Primary Care Provider: Gwinda [name] Other Clinician: Referring Provider: Treating Provider/Extender: [name] in Treatment: 80 Constitutional respirations regular, non-labored and within target range for patient.. Cardiovascular 2+ dorsalis pedis/posterior tibialis  pulses. [name], [name] ([id number]) [id number] Page 4 of 10 Psychiatric pleasant and cooperative. Notes Right foot: T the transmetatarsal amputation site there is an open wound with slough and granulation tissue with a tunnel to the medial aspect. No increased o warmth, erythema or purulent drainage noted. Electronic Signature(s) Signed: 12/17/2021 12:06:00 PM By: [name] Corwin DO Entered By: [name] on 12/17/2021 12:04:12 -------------------------------------------------------------------------------- Physician Orders Details Patient Name: Date of Service: [name] [name]. 12/17/2021 11:00 A M Medical Record Number: [id number] Patient Account Number: [id number] Date of Birth/Sex: Treating RN: 07-Dec-1980 (41 y.o. [name] Primary Care Provider: Gwinda [name] Other Clinician: Referring Provider: Treating Provider/Extender: [name] in Treatment: 72 Verbal / Phone Orders: No Diagnosis Coding Follow-up Appointments ppointment in 1 week. - Monday 12/24/21 ***extra time due to TCC*** Return A ppointment in: - This Friday 12/21/21 @ 0900 for 1st TCC ***extra time due to TCC*** Return A Anesthetic (In clinic) Topical Lidocaine 5% applied to wound bed (In clinic) Topical Lidocaine 4% applied to wound bed Bathing/ Shower/ Hygiene Other Bathing/Shower/Hygiene Orders/Instructions: - Clean with Saline or Dakins Edema Control - Lymphedema / SCD / Other Elevate legs to the level of the heart or above for 30 minutes daily and/or when sitting, a frequency of: - throughout the day void standing for long periods of time. - Limit time/pressure on feet, reduce PT Use wheelchair instead of walker when you can. . A Moisturize legs daily. Off-Loading Open toe surgical shoe to: - surgical shoe: reduce pressure when walking Additional Orders / Instructions Follow Nutritious Diet - -Monitor/Control Blood  Sugar -High Protein Diet Wound Treatment Wound #2 - Amputation Site - Transmetatarsal Wound Laterality: Right, Medial Cleanser: Normal Saline (Generic) 1 x Per Day/30 Days Discharge Instructions: Cleanse the wound with Normal Saline prior to applying a clean dressing using gauze sponges, not tissue or cotton balls. Topical: Gentamicin 1  x Per Day/30 Days Discharge Instructions: As directed by physician Prim Dressing: KerraCel Ag Gelling Fiber Dressing, 4x5 in (silver alginate) (Generic) 1 x Per Day/30 Days ary Discharge Instructions: Apply silver alginate to wound bed as instructed Secondary Dressing: Woven Gauze Sponge, Non-Sterile 4x4 in (Generic) 1 x Per Day/30 Days Discharge Instructions: Apply over primary dressing as directed. Secured With: Elastic Bandage 4 inch (ACE bandage) (Generic) 1 x Per Day/30 Days Discharge Instructions: Secure with ACE bandage as directed. Secured With: [employer], 4.5x3.1 (in/yd) (Generic) 1 x Per Day/30 Days Discharge Instructions: Secure with Kerlix as directed. LIANY, [name] ([id number]) [id number] Page 5 of 10 Electronic Signature(s) Signed: 12/17/2021 12:06:00 PM By: [name] Corwin DO Entered By: [name] on 12/17/2021 12:04:21 -------------------------------------------------------------------------------- Problem List Details Patient Name: Date of Service: [name] [name]. 12/17/2021 11:00 A M Medical Record Number: [id number] Patient Account Number: [id number] Date of Birth/Sex: Treating RN: [birth year] (41 y.o. F) Primary Care Provider: Gwinda [name] Other Clinician: Referring Provider: Treating Provider/Extender: [name] in Treatment: 39 Active Problems ICD-10 Encounter Code Description Active Date MDM Diagnosis L97.514 Non-pressure chronic ulcer of other part of right foot with necrosis of bone 01/22/2021 No Yes E11.621 Type 2 diabetes mellitus with  foot ulcer 01/22/2021 No Yes M86.9 Osteomyelitis, unspecified 01/22/2021 No Yes Inactive Problems Resolved Problems Electronic Signature(s) Signed: 12/17/2021 12:06:00 PM By: [name] Corwin DO Entered By: [name] on 12/17/2021 12:01:38 -------------------------------------------------------------------------------- Progress Note Details Patient Name: Date of Service: [name] [name]. 12/17/2021 11:00 A M Medical Record Number: [id number] Patient Account Number: [id number] Date of Birth/Sex: Treating RN: 1980-07-26 (41 y.o. F) Primary Care Provider: Gwinda [name] Other Clinician: Referring Provider: Treating Provider/Extender: [name] in Treatment: 47 Subjective Chief Complaint Information obtained from Patient Osteomyelitis of the right foot status post transmetatarsal amputation with surgical site dehiscence History of Present Illness (HPI) Admission 01/22/2021 Ms. Anyra Kaufman is a 41 year old female with a past medical history of insulin-dependent uncontrolled type 2 diabetes with last hemoglobin A1c of 13.5, osteomyelitis of the right foot status post transmetatarsal amputation on 12/18/2020 that presents to the clinic for right foot wound. She has had dehiscence of ALMETTA, LIDDICOAT [name] ([id number]) [id number] Page 6 of 10 the surgical site. She is currently using wet-to-dry dressings. She has a PICC line and receiving IV ceftriaxone daily for her osteomyelitis. There is an end date of 01/27/2021. She is also taking oral metronidazole. She currently denies systemic signs of infection. 1/19; patient presents for follow-up. She was diagnosed with a DVT to the right lower extremity 2 days ago. She is on Eliquis now. She is scheduled to see her infectious disease doctor tomorrow. She has been using Dakin's wet-to-dry dressings. She denies systemic signs of infection. 1/26; patient presents for follow-up. She saw infectious  disease on 1/21 started on Augmentin. Her PICC line and IV ceftriaxone was discontinued. Patient reports stability to her wound. She has been using Dakin's wet-to-dry dressings. She currently denies systemic signs of infection. 2/3; patient presents for follow-up. She continues to use Dakin's wet-to-dry dressings to the wound bed. She saw [name] with infectious disease yesterday and is continuing Augmentin. T entative end date is 2/16. Patient reports following up with orthopedics. She states there is no further plan from them. She currently denies systemic signs of infection. 2/10; patient presents for follow-up. She continues to use Dakin's wet to dry dressings. She is scheduled to have her MRI done on  2/14. She states that she had pain to the debridement site from last clinic visit and declines debridement today. She denies systemic signs of infection. She continues to have yellow thick drainage. 2/20; patient presents for follow-up. She continues to use Dakin's wet-to-dry dressings. She obtained her MRI. The results showed an abscess and she is scheduled to see her orthopedic surgeon on 2/23. She saw infectious disease 2/17 and her antibiotics were extended. She currently denies systemic signs of infection. 3/6; patient presents for follow-up. She had debridement and irrigation of her foot on 03/10/2021 due to abscess noted on MRI. She was started on IV ceftriaxone and oral Flagyl. She has no issues or complaints today. She has been using iodoform packing to the tunnel and Dakin's wet-to-dry to the opening. 03/26/2021: She continues on IV ceftriaxone and oral metronidazole. She has follow-up with infectious disease tomorrow. No significant issues or complaints today. Her mother continues to help her with her wound dressing, using iodoform packing strips into the tunnel and Dakin's to the open portion of the wound. 3/20; patient presents for follow-up. She continues to be on IV ceftriaxone in  oral metronidazole. She has been using iodoform to the tunnel and Dakin's wet-to- dry to the open wound. She denies signs of infection. 3/27; patient presents for follow-up. She no longer has a PICC line. She has been using iodoform to the tunnel and Dakin's wet-to-dry to the open wound. She reports improvement in wound healing. She denies signs of infection. 4/3; patient presents for follow-up. She states she has been using Hydrofera Blue to the open wound and iodoform packing to the tunnel without any issues. She denies signs of infection. 4/18; patient presents for follow-up. She saw infectious disease on 4/11. She has finished her oral antibiotics and completed a total of 6 weeks of antibiotics (this includes IV as well). No further antibiotics needed. She has been using Hydrofera Blue and iodoform packing. She states that the tunneled area has come in and the iodoform is not staying in place anymore. She has no issues or complaints today. She denies signs of infection. 4/24; patient presents for follow-up. She saw Dr. Carlene Coria, plastic surgery to discuss potential skin graft/substitute placement. At this time he thinks that the skin graft would likely not take. He is in agreement with trying a wound VAC. Patient has been using Hydrofera Blue dressing changes with no issues. She denies signs of infection. She reports improvement in wound healing. 5/1; patient presents for follow-up. Unfortunately patient did not have insurance when we ran for the pico. There is an assistance program and we are trying to get this accommodated for the patient. In the meantime she has been using Hydrofera Blue without any issues. She denies signs of infection. 5/8; patient presents for follow-up. We have not heard back if pico is covered by her insurance. She has been using collagen to the wound bed over the past week. She denies signs of infection. 5/18; patient presents for follow-up. She has been using collagen to  the wound bed without issues. Again we have not heard if pico is covered by her insurance. She has no issues or complaints today. 5/23; patient presents for follow-up. She has been using collagen to the wound bed. She has no issues or complaints today. She obtained the wound VAC from [facility] and brought this in today. She denies signs of infection. 6/1; patient presents for follow-up. She has been using the wound VAC for the past week. She has had  this changed twice since she was last here. She reports more maceration to the periwound. She denies signs of infection. 6/7; right TMA site. There are 2 wounds 0 separated by a bridge of healed tissue. The more lateral area has undermining. Both areas have healthy looking granulation at the base but relative the size of the wound is fairly deep. There is no exposed bone no evidence of infection. Her wound VAC was put on hold last week because of surrounding skin maceration she has been using collagen this week. She has a modified shoe 6/12; patient presents for follow-up. Last week the wound VAC was reinitiated. She had no issues with the wound VAC itself. Today she has maceration again noted to the surrounding skin. She denies signs of infection. 6/27; patient presents for follow-up. She has been using Medihoney to the wound bed. We took a break from the wound VAC because the periwound was macerated. She still has some areas of maceration to the distal foot where there is a callus. She currently denies signs of infection. 7/11; patient presents for follow-up. She has been using Medihoney to the wound bed. She has developed some increased warmth and erythema to the lateral aspect of the right foot. She states this is occurred over the past week and there is increased pain. No drainage noted. 7/17; patient presents for follow-up. She has been using Medihoney to the wound bed. She completed her course of Bactrim. She reports improvement in symptoms. 7/24;  Patient presents for follow up. She has been using Medihoney to the wound bed without issues. She completed another course of Bactrim. She reports improvement in her symptoms but still has some mild tenderness to the medial aspect of the foot. 7/31; Patient presents for follow-up. She has been using Medihoney and Dakin's to the wound bed. She denies signs of infection. 8/14; patient presents for follow-up. She has been using Dakin's wet-to-dry packing strips to the right medial aspect of the amputation site and Medihoney to the anterior site. She has no issues or complaints today. She has started physical therapy. She denies signs of infection. 8/29; patient presents for follow-up. She has been using Dakin's wet-to-dry packing strips to the right medial aspect of the amputation site however this is becoming more difficult to place. She did report that she had increased redness and swelling to that site and developed some drainage. It has resolved. She continues with physical therapy. 9/8; patient presents for follow-up. We have been using silver alginate to the tunneled area. She completed her course of antibiotics. She reports no pain, increased swelling or erythema. 9/21; patient presents for follow-up. She has been using gentamicin to the tunneled area. She has no issues or complaints today. She denies signs of infection. [name], [name] ([id number]) [id number] Page 7 of 10 10/2; patient presents for follow-up. She is scheduled to have her CT scan on 10/9. She currently denies signs of infection. She denies increased warmth, erythema or purulent drainage from the wound bed. She has been using Dakin's wet-to-dry dressings. 10/12; patient presents for follow-up. She had her CT scan on 10/9 that showed A small irregular rim-enhancing fluid pocket communicating to the overlying soft tissues of the sinus tract compatible with a small abscess. Currently she denies systemic  signs of infection. She has been doing Dakin's wet-to-dry packing strips but it is hard for her to pack into the narrow opening. 10/30; patient presents for follow-up. Since last clinic visit she has had OR debridement of her  Right foot by Dr. Odis Hollingshead Due to abscess noted on CT. She has been using iodoform packing. 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 She will complete 2 weeks of this and continue with oral amoxicillin for the next 6 to 12 months. She follows with Dr. Luciana Axe for this. She currently denies signs of infection. 11/13; patient presents for follow-up. Patient has been using silver alginate with gentamicin to the wound bed. She has no issues or complaints today she. She reports improvement in wound healing. 12/4; patient presents for follow-up. She has been using silver alginate and gentamicin to the wound bed. She has no issues or complaints today. Patient History Information obtained from Patient. Family History Cancer - Paternal Grandparents, Diabetes - Mother, Hypertension - Mother, Stroke - Maternal Grandparents, No family history of Heart Disease, Hereditary Spherocytosis, Kidney Disease, Lung Disease, Seizures, Thyroid Problems, Tuberculosis. Social History Never smoker, Marital Status - Single, Alcohol Use - Rarely, Drug Use - Prior History - Marijuana, Caffeine Use - Daily. Medical History Cardiovascular Patient has history of Hypertension Endocrine Patient has history of Type II Diabetes Musculoskeletal Patient has history of Osteomyelitis - Right Transmet 12/18/20 Neurologic Patient has history of Neuropathy Hospitalization/Surgery History - inpatient right foot abscess 10/12-10/18/2023. Objective Constitutional respirations regular, non-labored and within target range for patient.. Vitals Time Taken: 11:26 AM, Height: 69 in, Temperature: 98 F, Pulse: 76 bpm, Respiratory Rate: 18 breaths/min, Blood Pressure: 109/75 mmHg,  Capillary Blood Glucose: 129 mg/dl. Cardiovascular 2+ dorsalis pedis/posterior tibialis pulses. Psychiatric pleasant and cooperative. General Notes: Right foot: T the transmetatarsal amputation site there is an open wound with slough and granulation tissue with a tunnel to the medial aspect. o No increased warmth, erythema or purulent drainage noted. Integumentary (Hair, Skin) Wound #2 status is Open. Original cause of wound was Surgical Injury. The date acquired was: 07/10/2021. The wound has been in treatment 22 weeks. The wound is located on the Right,Medial Amputation Site - Transmetatarsal. The wound measures 0.3cm length x 1.5cm width x 0.6cm depth; 0.353cm^2 area and 0.212cm^3 volume. There is Fat Layer (Subcutaneous Tissue) exposed. There is no undermining noted, however, there is tunneling at 4:00 with a maximum distance of 1cm. There is a medium amount of serosanguineous drainage noted. The wound margin is distinct with the outline attached to the wound base. There is large (67-100%) pink, pale granulation within the wound bed. There is a small (1-33%) amount of necrotic tissue within the wound bed including Adherent Slough. The periwound skin appearance had no abnormalities noted for color. The periwound skin appearance exhibited: Callus. The periwound skin appearance did not exhibit: Crepitus, Excoriation, Induration, Rash, Scarring, Dry/Scaly, Maceration. Periwound temperature was noted as No Abnormality. Assessment Active Problems ICD-10 Non-pressure chronic ulcer of other part of right foot with necrosis of bone Type 2 diabetes mellitus with foot ulcer Osteomyelitis, unspecified [name], [name] ([id number]) [id number] Page 8 of 10 Patient's wound is stable. I debrided nonviable tissue. At this time I recommended a total contact cast to help with further offloading. We will set this up for next clinic visit. Continue with antibiotic ointment and  silver alginate. Procedures Wound #2 Pre-procedure diagnosis of Wound #2 is a Diabetic Wound/Ulcer of the Lower Extremity located on the Right,Medial Amputation Site - Transmetatarsal .Severity of Tissue Pre Debridement is: Fat layer exposed. There was a Excisional Skin/Subcutaneous Tissue Debridement with a total area of 0.45 sq cm performed by [name] Corwin, DO. With the following instrument(s): Curette to  remove Viable and Non-Viable tissue/material. Material removed includes Callus, Subcutaneous Tissue, and Slough after achieving pain control using Lidocaine. No specimens were taken. A time out was conducted at 11:44, prior to the start of the procedure. A Minimum amount of bleeding was controlled with Pressure. The procedure was tolerated well with a pain level of 0 throughout and a pain level of 0 following the procedure. Post Debridement Measurements: 0.3cm length x 1.5cm width x 0.6cm depth; 0.212cm^3 volume. Character of Wound/Ulcer Post Debridement is improved. Severity of Tissue Post Debridement is: Fat layer exposed. Post procedure Diagnosis Wound #2: Same as Pre-Procedure Plan Follow-up Appointments: Return Appointment in 1 week. - Monday 12/24/21 ***extra time due to TCC*** Return Appointment in: - This Friday 12/21/21 @ 0900 for 1st TCC ***extra time due to TCC*** Anesthetic: (In clinic) Topical Lidocaine 5% applied to wound bed (In clinic) Topical Lidocaine 4% applied to wound bed Bathing/ Shower/ Hygiene: Other Bathing/Shower/Hygiene Orders/Instructions: - Clean with Saline or Dakins Edema Control - Lymphedema / SCD / Other: Elevate legs to the level of the heart or above for 30 minutes daily and/or when sitting, a frequency of: - throughout the day Avoid standing for long periods of time. - Limit time/pressure on feet, reduce PT Use wheelchair instead of walker when you can. . Moisturize legs daily. Off-Loading: Open toe surgical shoe to: - surgical shoe: reduce  pressure when walking Additional Orders / Instructions: Follow Nutritious Diet - -Monitor/Control Blood Sugar -High Protein Diet WOUND #2: - Amputation Site - Transmetatarsal Wound Laterality: Right, Medial Cleanser: Normal Saline (Generic) 1 x Per Day/30 Days Discharge Instructions: Cleanse the wound with Normal Saline prior to applying a clean dressing using gauze sponges, not tissue or cotton balls. Topical: Gentamicin 1 x Per Day/30 Days Discharge Instructions: As directed by physician Prim Dressing: KerraCel Ag Gelling Fiber Dressing, 4x5 in (silver alginate) (Generic) 1 x Per Day/30 Days ary Discharge Instructions: Apply silver alginate to wound bed as instructed Secondary Dressing: Woven Gauze Sponge, Non-Sterile 4x4 in (Generic) 1 x Per Day/30 Days Discharge Instructions: Apply over primary dressing as directed. Secured With: Elastic Bandage 4 inch (ACE bandage) (Generic) 1 x Per Day/30 Days Discharge Instructions: Secure with ACE bandage as directed. Secured With: [employer] Roll Sterile, 4.5x3.1 (in/yd) (Generic) 1 x Per Day/30 Days Discharge Instructions: Secure with Kerlix as directed. 1. In office sharp debridement 2. Silver alginate with gentamicin ointment 3. Aggressive offloadingoosurgical shoe 4. Follow-up at the end of the week for total contact cast placement Electronic Signature(s) Signed: 12/17/2021 12:06:00 PM By: [name] CorwinHoffman, Tarsha Blando DO Entered By: [name] CorwinHoffman, Coretha Creswell on 12/17/2021 12:05:35 -------------------------------------------------------------------------------- HxROS Details Patient Name: Date of Service: [name] [name]. 12/17/2021 11:00 A M Medical Record Number: [id number] Patient Account Number: [id number] Date of Birth/Sex: Treating RN: 15-Aug-1980 (41 y.o. F) Primary Care Provider: Gwinda PasseEdwards, Michelle Other Clinician: Rosana HoesDAVIS, [name] ([id number]) [id number] Page 9 of 10 Referring Provider: Treating Provider/Extender: [name] IsaacHoffman,  Mylin Hirano Edwards, Michelle Weeks in Treatment: 47 Information Obtained From Patient Cardiovascular Medical History: Positive for: Hypertension Endocrine Medical History: Positive for: Type II Diabetes Time with diabetes: Dx 2009 Treated with: Insulin, Oral agents Blood sugar tested every day: Yes Tested : daily Musculoskeletal Medical History: Positive for: Osteomyelitis - Right Transmet 12/18/20 Neurologic Medical History: Positive for: Neuropathy Immunizations Pneumococcal Vaccine: Received Pneumococcal Vaccination: No Implantable Devices Yes Hospitalization / Surgery History Type of Hospitalization/Surgery inpatient right foot abscess 10/12-10/18/2023 Family and Social History Cancer: Yes - Paternal Grandparents; Diabetes: Yes - Mother; Heart Disease:  No; Hereditary Spherocytosis: No; Hypertension: Yes - Mother; Kidney Disease: No; Lung Disease: No; Seizures: No; Stroke: Yes - Maternal Grandparents; Thyroid Problems: No; Tuberculosis: No; Never smoker; Marital Status - Single; Alcohol Use: Rarely; Drug Use: Prior History - Marijuana; Caffeine Use: Daily; Financial Concerns: No; Food, Clothing or Shelter Needs: No; Support System Lacking: No; Transportation Concerns: No Electronic Signature(s) Signed: 12/17/2021 12:06:00 PM By: [name] Corwin DO Entered By: [name] on 12/17/2021 12:03:54 -------------------------------------------------------------------------------- SuperBill Details Patient Name: Date of Service: [name] [name]. 12/17/2021 Medical Record Number: [id number] Patient Account Number: [id number] Date of Birth/Sex: Treating RN: 06/26/1980 (41 y.o. [name] Primary Care Provider: Gwinda [name] Other Clinician: Referring Provider: Treating Provider/Extender: [name] in Treatment: 47 Diagnosis Coding ICD-10 Codes Code Description [phone] Non-pressure chronic ulcer of other part of right foot with  necrosis of bone Higinbotham, Dajha [name] ([id number]) [id number] Page 10 of 10 E11.621 Type 2 diabetes mellitus with foot ulcer M86.9 Osteomyelitis, unspecified Facility Procedures : CPT4 Code: [id number] Description: 11042 - DEB SUBQ TISSUE 20 SQ CM/< ICD-10 Diagnosis Description L97.514 Non-pressure chronic ulcer of other part of right foot with necrosis of bone Modifier: Quantity: 1 Physician Procedures : CPT4 Code Description Modifier [id number] 11042 - WC PHYS SUBQ TISS 20 SQ CM ICD-10 Diagnosis Description L97.514 Non-pressure chronic ulcer of other part of right foot with necrosis of bone Quantity: 1 Electronic Signature(s) Signed: 12/17/2021 12:06:00 PM By: [name] Corwin DO Entered By: [name] on 12/17/2021 12:05:42

## 2021-12-20 NOTE — Progress Notes (Signed)
IVIE, SAVITT ([id number]) [id number] Page 1 of 8 Visit Report for 12/17/2021 Arrival Information Details Patient Name: Date of Service: [name], [name]. 12/17/2021 11:00 A M Medical Record Number: [id number] Patient Account Number: [id number] Date of Birth/Sex: Treating RN: Jun 12, 1980 (41 y.o. F) Primary Care Eros Montour: Juluis Mire Other Clinician: Referring Dorcas Melito: Treating Anthonee Gelin/Extender: Edmonia Lynch in Treatment: 45 Visit Information History Since Last Visit Added or deleted any medications: No Patient Arrived: Wheel Chair Any new allergies or adverse reactions: No Arrival Time: 11:22 Had a fall or experienced change in No Accompanied By: mom activities of daily living that may affect Transfer Assistance: None risk of falls: Patient Identification Verified: Yes Signs or symptoms of abuse/neglect since last visito No Secondary Verification Process Completed: Yes Hospitalized since last visit: No Patient Requires Transmission-Based No Implantable device outside of the clinic excluding No Precautions: cellular tissue based products placed in the center Patient Has Alerts: Yes since last visit: Patient Alerts: Patient on Blood Thinner Has Dressing in Place as Prescribed: Yes PICC R Arm Pain Present Now: No ABI 12/17/20 R=1.08 [name]=1.13 Electronic Signature(s) Signed: 12/19/2021 4:44:51 PM By: Erenest Blank Entered By: Erenest Blank on 12/17/2021 11:26:23 -------------------------------------------------------------------------------- Encounter Discharge Information Details Patient Name: Date of Service: [name], [name]. 12/17/2021 11:00 A M Medical Record Number: [id number] Patient Account Number: [id number] Date of Birth/Sex: Treating RN: 1980/06/10 (41 y.o. Tonita Phoenix, [name] Primary Care Idriss Quackenbush: Juluis Mire Other Clinician: Referring Kamaria Lucia: Treating Tanajah Boulter/Extender: Edmonia Lynch in Treatment: 82 Encounter Discharge Information Items Post Procedure Vitals Discharge Condition: Stable Temperature (F): 98.7 Ambulatory Status: Wheelchair Pulse (bpm): 74 Discharge Destination: Home Respiratory Rate (breaths/min): 17 Transportation: Private Auto Blood Pressure (mmHg): 120/80 Accompanied By: mom Schedule Follow-up Appointment: Yes Clinical Summary of Care: Patient Declined Electronic Signature(s) Signed: 12/17/2021 3:46:46 PM By: Rhae Hammock RN Entered By: Rhae Hammock on 12/17/2021 12:01:10 [name], [name] ([id number]) [id number] Page 2 of 8 -------------------------------------------------------------------------------- Lower Extremity Assessment Details Patient Name: Date of Service: [name], [name]. 12/17/2021 11:00 A M Medical Record Number: [id number] Patient Account Number: [id number] Date of Birth/Sex: Treating RN: [birth year] (41 y.o. F) Primary Care Junius Faucett: Juluis Mire Other Clinician: Referring Ky Rumple: Treating Delayni Streed/Extender: Edmonia Lynch in Treatment: 47 Edema Assessment Assessed: [Left: No] [Right: No] Edema: [Left: Ye] [Right: s] Calf Left: Right: Point of Measurement: 34 cm From Medial Instep 49.5 cm Ankle Left: Right: Point of Measurement: 8 cm From Medial Instep 27.5 cm Electronic Signature(s) Signed: 12/19/2021 4:44:51 PM By: Erenest Blank Entered By: Erenest Blank on 12/17/2021 11:34:21 -------------------------------------------------------------------------------- Multi Wound Chart Details Patient Name: Date of Service: [name], [name]. 12/17/2021 11:00 A M Medical Record Number: [id number] Patient Account Number: [id number] Date of Birth/Sex: Treating RN: 01-01-1981 (41 y.o. F) Primary Care Ozil Stettler: Juluis Mire Other Clinician: Referring Dinesha Twiggs: Treating Jaymie Mckiddy/Extender: Edmonia Lynch in Treatment: 47 Vital Signs Height(in): 69 Capillary Blood Glucose(mg/dl): 129 Weight(lbs): Pulse(bpm): 76 Body Mass Index(BMI): Blood Pressure(mmHg): 109/75 Temperature(F): 98 Respiratory Rate(breaths/min): 18 [2:Photos:] [N/A:N/A] Right, Medial Amputation Site - N/A N/A Wound Location: Transmetatarsal Surgical Injury N/A N/A Wounding Event: Diabetic Wound/Ulcer of the Lower N/A N/A Primary Etiology: Extremity Open Surgical Wound N/A N/A Secondary EtiologyLANDRI, [name] ([id number]) [id number] Page 3 of 8 Hypertension, Type II Diabetes, N/A N/A Comorbid History: Osteomyelitis, Neuropathy 07/10/2021 N/A N/A Date Acquired: 45 N/A N/A Weeks of Treatment: Open N/A N/A Wound Status: No N/A  N/A Wound Recurrence: 0.3x1.5x0.6 N/A N/A Measurements [name] x W x D (cm) 0.353 N/A N/A A (cm) : rea 0.212 N/A N/A Volume (cm) : -199.20% N/A N/A % Reduction in A rea: -505.70% N/A N/A % Reduction in Volume: 4 Position 1 (o'clock): 1 Maximum Distance 1 (cm): Yes N/A N/A Tunneling: Grade 3 N/A N/A Classification: Medium N/A N/A Exudate A mount: Serosanguineous N/A N/A Exudate Type: red, brown N/A N/A Exudate Color: Distinct, outline attached N/A N/A Wound Margin: Large (67-100%) N/A N/A Granulation A mount: Pink, Pale N/A N/A Granulation Quality: Small (1-33%) N/A N/A Necrotic A mount: Fat Layer (Subcutaneous Tissue): Yes N/A N/A Exposed Structures: Fascia: No Tendon: No Muscle: No Joint: No Bone: No None N/A N/A Epithelialization: Debridement - Excisional N/A N/A Debridement: Pre-procedure Verification/Time Out 11:44 N/A N/A Taken: Lidocaine N/A N/A Pain Control: Callus, Subcutaneous, Slough N/A N/A Tissue Debrided: Skin/Subcutaneous Tissue N/A N/A Level: 0.45 N/A N/A Debridement A (sq cm): rea Curette N/A N/A Instrument: Minimum N/A N/A Bleeding: Pressure N/A N/A Hemostasis A chieved: 0 N/A  N/A Procedural Pain: 0 N/A N/A Post Procedural Pain: Procedure was tolerated well N/A N/A Debridement Treatment Response: 0.3x1.5x0.6 N/A N/A Post Debridement Measurements [name] x W x D (cm) 0.212 N/A N/A Post Debridement Volume: (cm) Callus: Yes N/A N/A Periwound Skin Texture: Excoriation: No Induration: No Crepitus: No Rash: No Scarring: No Maceration: No N/A N/A Periwound Skin Moisture: Dry/Scaly: No Atrophie Blanche: No N/A N/A Periwound Skin Color: Cyanosis: No Ecchymosis: No Erythema: No Hemosiderin Staining: No Mottled: No Pallor: No Rubor: No No Abnormality N/A N/A Temperature: Debridement N/A N/A Procedures Performed: Treatment Notes Wound #2 (Amputation Site - Transmetatarsal) Wound Laterality: Right, Medial Cleanser Normal Saline Discharge Instruction: Cleanse the wound with Normal Saline prior to applying a clean dressing using gauze sponges, not tissue or cotton balls. Peri-Wound Care Topical Gentamicin Discharge Instruction: As directed by physician Primary Dressing KerraCel Ag Gelling Fiber Dressing, 4x5 in (silver alginate) Discharge Instruction: Apply silver alginate to wound bed as instructed Secondary Dressing Woven Gauze Sponge, Non-Sterile 4x4 in [name], [name] ([id number]) [id number] Page 4 of 8 Discharge Instruction: Apply over primary dressing as directed. Secured With Elastic Bandage 4 inch (ACE bandage) Discharge Instruction: Secure with ACE bandage as directed. Kerlix Roll Sterile, 4.5x3.1 (in/yd) Discharge Instruction: Secure with Kerlix as directed. Compression Wrap Compression Stockings Add-Ons Electronic Signature(s) Signed: 12/17/2021 12:06:00 PM By: Kalman Shan DO Entered By: Kalman Shan on 12/17/2021 12:01:44 -------------------------------------------------------------------------------- Multi-Disciplinary Care Plan Details Patient Name: Date of Service: [name], [name]. 12/17/2021  11:00 A M Medical Record Number: [id number] Patient Account Number: [id number] Date of Birth/Sex: Treating RN: [birth year] (41 y.o. Tonita Phoenix, [name] Primary Care Adonis Ryther: Juluis Mire Other Clinician: Referring Asha Grumbine: Treating Lilyth Lawyer/Extender: Edmonia Lynch in Treatment: 17 Active Inactive Nutrition Nursing Diagnoses: Impaired glucose control: actual or potential Goals: Patient/caregiver verbalizes understanding of need to maintain therapeutic glucose control per primary care physician Date Initiated: 01/22/2021 Target Resolution Date: 01/12/2022 Goal Status: Active Interventions: Assess HgA1c results as ordered upon admission and as needed Provide education on elevated blood sugars and impact on wound healing Treatment Activities: Obtain HgA1c : 01/22/2021 Notes: 07/10/21: Glucose control ongoing 09/11/21 : Glucose control ongoing, patient not compliant in checking glucose. Wound/Skin Impairment Nursing Diagnoses: Impaired tissue integrity Goals: Patient/caregiver will verbalize understanding of skin care regimen Date Initiated: 01/22/2021 Target Resolution Date: 01/12/2022 Goal Status: Active Ulcer/skin breakdown will have a volume reduction of 30% by week 4 Date Initiated: 01/22/2021  Date Inactivated: 03/19/2021 Target Resolution Date: 03/16/2021 Goal Status: Met Ulcer/skin breakdown will have a volume reduction of 50% by week 8 Date Initiated: 03/19/2021 Date Inactivated: 05/14/2021 Target Resolution Date: 04/16/2021 Unmet Reason: see wound Goal Status: Unmet measurements [name], [name] ([id number]) [id number] Page 5 of 8 Interventions: Assess patient/caregiver ability to obtain necessary supplies Assess patient/caregiver ability to perform ulcer/skin care regimen upon admission and as needed Assess ulceration(s) every visit Provide education on ulcer and skin care Treatment Activities: Topical wound management  initiated : 01/22/2021 Notes: 06/04/21: Wound vac started 07/10/21: Wound care regimen continues Electronic Signature(s) Signed: 12/17/2021 3:46:46 PM By: Rhae Hammock RN Entered By: Rhae Hammock on 12/17/2021 11:47:27 -------------------------------------------------------------------------------- Pain Assessment Details Patient Name: Date of Service: [name], [name]. 12/17/2021 11:00 A M Medical Record Number: [id number] Patient Account Number: [id number] Date of Birth/Sex: Treating RN: April 09, 1980 (41 y.o. F) Primary Care Taleya Whitcher: Juluis Mire Other Clinician: Referring Reve Crocket: Treating Takiyah Bohnsack/Extender: Edmonia Lynch in Treatment: 39 Active Problems Location of Pain Severity and Description of Pain Patient Has Paino No Site Locations Pain Management and Medication Current Pain Management: Electronic Signature(s) Signed: 12/19/2021 4:44:51 PM By: Erenest Blank Entered By: Erenest Blank on 12/17/2021 11:27:00 Patient/Caregiver Education Details -------------------------------------------------------------------------------- [name] ([id number]) [id number] Page 6 of 8 Patient Name: Date of Service: [name], [name]. 12/4/2023andnbsp11:00 A M Medical Record Number: [id number] Patient Account Number: [id number] Date of Birth/Gender: Treating RN: March 25, 1980 (41 y.o. Tonita Phoenix, [name] Primary Care Physician: Juluis Mire Other Clinician: Referring Physician: Treating Physician/Extender: Edmonia Lynch in Treatment: 37 Education Assessment Education Provided To: Patient Education Topics Provided Wound/Skin Impairment: Methods: Explain/Verbal Responses: Reinforcements needed, State content correctly Electronic Signature(s) Signed: 12/17/2021 3:46:46 PM By: Rhae Hammock RN Entered By: Rhae Hammock on 12/17/2021  11:47:39 -------------------------------------------------------------------------------- Wound Assessment Details Patient Name: Date of Service: [name], [name]. 12/17/2021 11:00 A M Medical Record Number: [id number] Patient Account Number: [id number] Date of Birth/Sex: Treating RN: 1980-07-15 (41 y.o. 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, Medial Amputation Site - Transmetatarsal Secondary Open Surgical Wound Etiology: Wounding Event: Surgical Injury Wound Status: Open Date Acquired: 07/10/2021 Comorbid History: Hypertension, Type II Diabetes, Osteomyelitis, Weeks Of Treatment: 22 Neuropathy Clustered Wound: No Photos Wound Measurements Length: (cm) 0.3 Width: (cm) 1.5 Depth: (cm) 0.6 Area: (cm) 0.353 Volume: (cm) 0.212 % Reduction in Area: -199.2% % Reduction in Volume: -505.7% Epithelialization: None Tunneling: Yes Position (o'clock): 4 Maximum Distance: (cm) 1 Undermining: No Wound Description [name], [name] ([id number]) Classification: Grade 3 Wound Margin: Distinct, outline attached Exudate Amount: Medium Exudate Type: Serosanguineous Exudate Color: red, brown [id number] Page 7 of 8 Foul Odor After Cleansing: No Slough/Fibrino Yes Wound Bed Granulation Amount: Large (67-100%) Exposed Structure Granulation Quality: Pink, Pale Fascia Exposed: No Necrotic Amount: Small (1-33%) Fat Layer (Subcutaneous Tissue) Exposed: Yes Necrotic Quality: Adherent Slough Tendon Exposed: No Muscle Exposed: No Joint Exposed: No Bone Exposed: No Periwound Skin Texture Texture Color No Abnormalities Noted: No No Abnormalities Noted: Yes Callus: Yes Temperature / Pain Crepitus: No Temperature: No  Abnormality Excoriation: No Induration: No Rash: No Scarring: No Moisture No Abnormalities Noted: No Dry / Scaly: No Maceration: No Treatment Notes Wound #2 (Amputation Site - Transmetatarsal) Wound Laterality: Right, Medial Cleanser Normal Saline Discharge Instruction: Cleanse the wound  with Normal Saline prior to applying a clean dressing using gauze sponges, not tissue or cotton balls. Peri-Wound Care Topical Gentamicin Discharge Instruction: As directed by physician Primary Dressing KerraCel Ag Gelling Fiber Dressing, 4x5 in (silver alginate) Discharge Instruction: Apply silver alginate to wound bed as instructed Secondary Dressing Woven Gauze Sponge, Non-Sterile 4x4 in Discharge Instruction: Apply over primary dressing as directed. Secured With Elastic Bandage 4 inch (ACE bandage) Discharge Instruction: Secure with ACE bandage as directed. Kerlix Roll Sterile, 4.5x3.1 (in/yd) Discharge Instruction: Secure with Kerlix as directed. Compression Wrap Compression Stockings Add-Ons Electronic Signature(s) Signed: 12/17/2021 3:46:46 PM By: Rhae Hammock RN Entered By: Rhae Hammock on 12/17/2021 11:47:07 [name], [name] ([id number]) [id number] Page 8 of 8 -------------------------------------------------------------------------------- Vitals Details Patient Name: Date of Service: [name], [name]. 12/17/2021 11:00 A M Medical Record Number: [id number] Patient Account Number: [id number] Date of Birth/Sex: Treating RN: 1980-06-10 (41 y.o. F) Primary Care Maddie Brazier: Juluis Mire Other Clinician: Referring Kearra Calkin: Treating Theron Cumbie/Extender: Edmonia Lynch in Treatment: 47 Vital Signs Time Taken: 11:26 Temperature (F): 98 Height (in): 69 Pulse (bpm): 76 Respiratory Rate (breaths/min): 18 Blood Pressure (mmHg): 109/75 Capillary Blood Glucose (mg/dl): 129 Reference Range: 80 - 120 mg / dl Electronic  Signature(s) Signed: 12/19/2021 4:44:51 PM By: Erenest Blank Entered By: Erenest Blank on 12/17/2021 11:26:55

## 2021-12-21 ENCOUNTER — Encounter (HOSPITAL_BASED_OUTPATIENT_CLINIC_OR_DEPARTMENT_OTHER): Payer: Medicaid Other | Admitting: Internal Medicine

## 2021-12-21 DIAGNOSIS — L97514 Non-pressure chronic ulcer of other part of right foot with necrosis of bone: Secondary | ICD-10-CM

## 2021-12-21 DIAGNOSIS — E11621 Type 2 diabetes mellitus with foot ulcer: Secondary | ICD-10-CM | POA: Diagnosis not present

## 2021-12-24 ENCOUNTER — Ambulatory Visit: Payer: Medicaid Other | Attending: Nurse Practitioner | Admitting: Pharmacist

## 2021-12-24 DIAGNOSIS — E1165 Type 2 diabetes mellitus with hyperglycemia: Secondary | ICD-10-CM | POA: Diagnosis not present

## 2021-12-24 NOTE — Progress Notes (Signed)
S:    [name] is a 41 y.o. female who presents for diabetes evaluation, education, and management. PMH is significant for HTN, T2DM, diabetic foot infection and osteomyelitis, HLD, obesity, hx of DVT, hx of hyponatremia. Patient was referred and last seen by Primary Care Provider, Gwinda Passe, on 06/26/21. Last seen by pharmacy clinic 10/09/2021. Of note, patient has canceled or no showed her pharmacy clinic appointment multiple times.   At last visit with PCP, patient was tearful regarding her conditions and refused medications or counseling.   Today, patient arrives in good spirits and presents with the assistance of a wheelchair and her mother. Unfortunately, she arrives >15 minutes late to our appointment today which limits our visit. I was able to confirm she has refills on all medications. She tells me that "when she is good she is good", but continues to miss 1-2 doses/week of insulin. She says she does not want to use reminders on her phone and doesn't that sticky notes around the house would help improve her adherence. She is resistant to any medication changes.    Family/Social History: former smoker; family hx of DM and HTN  Current diabetes medications include: Trulicity 1.5 mg weekly, Basaglar 70 units daily, metformin 1000mg  BID (two tablets BID)  Patient denies adherence to all medications as prescribed. Misses 1-2 doses a week. Of note, dispense report shows Lantus and Trulicity were last dispensed on 10/10/2021 (both prescriptions dispensed for a 21 and 28 day supply, respectively. Expect patient has been out of medications for >1 month).  Insurance coverage: Medicaid   Patient denies hypoglycemic events.  Patient reported home CBG levels: not checking regularly. Last checked 12/10/2021. Provides a food diary with BG readings 100s-200s.   Patient denies polyuria, polydipsia.  Patient endorses neuropathy (nerve pain).  Patient denies visual changes.  Patient  reports self foot exams.   Patient reported dietary habits: unable to assess today due to patient being late to appointment  Patient-reported exercise habits: unable to assess today due to patient being late to appointment  O:   Lab Results  Component Value Date   HGBA1C 8.8 (H) 10/29/2021   There were no vitals filed for this visit.  Lipid Panel     Component Value Date/Time   CHOL 210 (H) 10/17/2021 1159   TRIG 159 (H) 10/17/2021 1159   HDL 56 10/17/2021 1159   CHOLHDL 3.8 10/17/2021 1159   CHOLHDL 7.1 12/20/2020 0434   VLDL 21 12/20/2020 0434   LDLCALC 126 (H) 10/17/2021 1159    Clinical Atherosclerotic Cardiovascular Disease (ASCVD): No  The 10-year ASCVD risk score (Arnett DK, et al., 2019) is: 1.2%   Values used to calculate the score:     Age: 47 years     Sex: Female     Is Non-Hispanic African American: Yes     Diabetic: Yes     Tobacco smoker: No     Systolic Blood Pressure: 106 mmHg     Is BP treated: Yes     HDL Cholesterol: 56 mg/dL     Total Cholesterol: 210 mg/dL   Patient is participating in a Managed Medicaid Plan:  Yes   A/P: Diabetes longstanding, currently above goal. Patient was >15 minutes late to appointment today so I was only able to confirm patient has refills on her medications and her BG readings are stable. Patient is able to verbalize appropriate hypoglycemia management plan. Medication adherence needs major improvement as the dispense report shows her  Lantus and Trulicity were last filled in September 2023. Patient is not agreeable to my suggestions of adding a phone reminder and/or sticky notes to improve adherence. Control is suboptimal due to non-adherence, dietary indiscretion, and physical inactivity. -Continue Basaglar to 70 units daily. Discussed with patient that if her adherence and A1c does not improve, meal time insulin is our next step.   -Continued Trulicity (dulaglutide) 1.5mg  weekly.  -Continued metformin 1000mg  BID.   -Ensured patient had refills on all medications.  -Extensively discussed pathophysiology of diabetes, recommended lifestyle interventions, dietary effects on blood sugar control.  -Counseled on s/sx of and management of hypoglycemia.  -Asked patient to take more BG readings at different times throughout the day and bring readings to next appointment.  -Next A1c anticipated at PCP visit.   Written patient instructions provided. Patient verbalized understanding of treatment plan.  Total time in face to face counseling 10 minutes.    Follow-up:  Pharmacist visit PRN PCP visit 01/18/2022  03/19/2022, Pharm.D. PGY-2 Ambulatory Care Pharmacy Resident 12/24/2021 3:13 PM

## 2021-12-25 ENCOUNTER — Encounter (HOSPITAL_BASED_OUTPATIENT_CLINIC_OR_DEPARTMENT_OTHER): Payer: Medicaid Other | Admitting: Internal Medicine

## 2021-12-25 DIAGNOSIS — E11621 Type 2 diabetes mellitus with foot ulcer: Secondary | ICD-10-CM | POA: Diagnosis not present

## 2021-12-25 DIAGNOSIS — L97514 Non-pressure chronic ulcer of other part of right foot with necrosis of bone: Secondary | ICD-10-CM

## 2021-12-26 NOTE — Progress Notes (Signed)
[name] ([id number]) [id number] Page 1 of 11 Visit Report for 12/25/2021 Chief Complaint Document Details Patient Name: Date of Service: [name] IS, [name] 12/25/2021 2:00 PM Medical Record Number: [id number] Patient Account Number: [id number] Date of Birth/Sex: Treating RN: 05-30-1980 (41 y.o. F) Primary Care Provider: Juluis Mire Other Clinician: Referring Provider: Treating Provider/Extender: Edmonia Lynch in Treatment: 48 Information Obtained from: Patient Chief Complaint Osteomyelitis of the right foot status post transmetatarsal amputation with surgical site dehiscence Electronic Signature(s) Signed: 12/25/2021 4:30:17 PM By: Kalman Shan DO Entered By: Kalman Shan on 12/25/2021 14:46:37 -------------------------------------------------------------------------------- Debridement Details Patient Name: Date of Service: [name] V IS, [name] 12/25/2021 2:00 PM Medical Record Number: [id number] Patient Account Number: [id number] Date of Birth/Sex: Treating RN: May 29, 1980 (42 y.o. [name], Meta.Reding Primary Care Provider: Juluis Mire Other Clinician: Referring Provider: Treating Provider/Extender: Edmonia Lynch in Treatment: 48 Debridement Performed for Assessment: Wound #2 Right,Medial Amputation Site - Transmetatarsal Performed By: Physician Kalman Shan, DO Debridement Type: Debridement Severity of Tissue Pre Debridement: Fat layer exposed Level of Consciousness (Pre-procedure): Awake and Alert Pre-procedure Verification/Time Out Yes - 14:26 Taken: Start Time: 14:27 Pain Control: Lidocaine 4% T opical Solution T Area Debrided (L x W): otal 1 (cm) x 1 (cm) = 1 (cm) Tissue and other material debrided: Viable, Non-Viable, Callus, Slough, Subcutaneous, Slough Level: Skin/Subcutaneous Tissue Debridement Description: Excisional Instrument: Curette Bleeding:  Minimum Hemostasis Achieved: Pressure End Time: 14:33 Procedural Pain: 0 Post Procedural Pain: 0 Response to Treatment: Procedure was tolerated well Level of Consciousness (Post- Awake and Alert procedure): Post Debridement Measurements of Total Wound Length: (cm) 0.3 Width: (cm) 0.7 Depth: (cm) 0.5 Volume: (cm) 0.082 Character of Wound/Ulcer Post Debridement: Improved Severity of Tissue Post Debridement: Fat layer exposed Marrow, Kaliopi L ([id number] Page 2 of 11 Post Procedure Diagnosis Same as Pre-procedure Electronic Signature(s) Signed: 12/25/2021 4:30:17 PM By: Kalman Shan DO Signed: 12/25/2021 4:43:40 PM By: [name] RN, BSN Entered By: [name] on 12/25/2021 14:34:14 -------------------------------------------------------------------------------- HPI Details Patient Name: Date of Service: [name] V IS, [name] 12/25/2021 2:00 PM Medical Record Number: [id number] Patient Account Number: [id number] Date of Birth/Sex: Treating RN: 08-10-1980 (41 y.o. F) Primary Care Provider: Juluis Mire Other Clinician: Referring Provider: Treating Provider/Extender: Edmonia Lynch in Treatment: 37 History of Present Illness HPI Description: Admission 01/22/2021 [name] is a 41 year old female with a past medical history of insulin-dependent uncontrolled type 2 diabetes with last hemoglobin A1c of 13.5, osteomyelitis of the right foot status post transmetatarsal amputation on 12/18/2020 that presents to the clinic for right foot wound. She has had dehiscence of the surgical site. She is currently using wet-to-dry dressings. She has a PICC line and receiving IV ceftriaxone daily for her osteomyelitis. There is an end date of 01/27/2021. She is also taking oral metronidazole. She currently denies systemic signs of infection. 1/19; patient presents for follow-up. She was diagnosed with a DVT to the right  lower extremity 2 days ago. She is on Eliquis now. She is scheduled to see her infectious disease doctor tomorrow. She has been using Dakin's wet-to-dry dressings. She denies systemic signs of infection. 1/26; patient presents for follow-up. She saw infectious disease on 1/21 started on Augmentin. Her PICC line and IV ceftriaxone was discontinued. Patient reports stability to her wound. She has been using Dakin's wet-to-dry dressings. She currently denies systemic signs of infection. 2/3; patient presents for follow-up. She continues to use Dakin's  wet-to-dry dressings to the wound bed. She saw Dr. Yvette Rack with infectious disease yesterday and is continuing Augmentin. T entative end date is 2/16. Patient reports following up with orthopedics. She states there is no further plan from them. She currently denies systemic signs of infection. 2/10; patient presents for follow-up. She continues to use Dakin's wet to dry dressings. She is scheduled to have her MRI done on 2/14. She states that she had pain to the debridement site from last clinic visit and declines debridement today. She denies systemic signs of infection. She continues to have yellow thick drainage. 2/20; patient presents for follow-up. She continues to use Dakin's wet-to-dry dressings. She obtained her MRI. The results showed an abscess and she is scheduled to see her orthopedic surgeon on 2/23. She saw infectious disease 2/17 and her antibiotics were extended. She currently denies systemic signs of infection. 3/6; patient presents for follow-up. She had debridement and irrigation of her foot on 03/10/2021 due to abscess noted on MRI. She was started on IV ceftriaxone and oral Flagyl. She has no issues or complaints today. She has been using iodoform packing to the tunnel and Dakin's wet-to-dry to the opening. 03/26/2021: She continues on IV ceftriaxone and oral metronidazole. She has follow-up with infectious disease tomorrow. No  significant issues or complaints today. Her mother continues to help her with her wound dressing, using iodoform packing strips into the tunnel and Dakin's to the open portion of the wound. 3/20; patient presents for follow-up. She continues to be on IV ceftriaxone in oral metronidazole. She has been using iodoform to the tunnel and Dakin's wet-to- dry to the open wound. She denies signs of infection. 3/27; patient presents for follow-up. She no longer has a PICC line. She has been using iodoform to the tunnel and Dakin's wet-to-dry to the open wound. She reports improvement in wound healing. She denies signs of infection. 4/3; patient presents for follow-up. She states she has been using Hydrofera Blue to the open wound and iodoform packing to the tunnel without any issues. She denies signs of infection. 4/18; patient presents for follow-up. She saw infectious disease on 4/11. She has finished her oral antibiotics and completed a total of 6 weeks of antibiotics (this includes IV as well). No further antibiotics needed. She has been using Hydrofera Blue and iodoform packing. She states that the tunneled area has come in and the iodoform is not staying in place anymore. She has no issues or complaints today. She denies signs of infection. 4/24; patient presents for follow-up. She saw Dr. Despina Pole, plastic surgery to discuss potential skin graft/substitute placement. At this time he thinks that the skin graft would likely not take. He is in agreement with trying a wound VAC. Patient has been using Hydrofera Blue dressing changes with no issues. She denies signs of infection. She reports improvement in wound healing. 5/1; patient presents for follow-up. Unfortunately patient did not have insurance when we ran for the pico. There is an assistance program and we are trying to get this accommodated for the patient. In the meantime she has been using Hydrofera Blue without any issues. She denies signs of  infection. 5/8; patient presents for follow-up. We have not heard back if pico is covered by her insurance. She has been using collagen to the wound bed over the past week. She denies signs of infection. 5/18; patient presents for follow-up. She has been using collagen to the wound bed without issues. Again we have not heard if pico  is covered by her insurance. She has no issues or complaints today. VIVIANE, GARATE ([id number]) [id number] Page 3 of 11 5/23; patient presents for follow-up. She has been using collagen to the wound bed. She has no issues or complaints today. She obtained the wound VAC from [facility] and brought this in today. She denies signs of infection. 6/1; patient presents for follow-up. She has been using the wound VAC for the past week. She has had this changed twice since she was last here. She reports more maceration to the periwound. She denies signs of infection. 6/7; right TMA site. There are 2 wounds 0 separated by a bridge of healed tissue. The more lateral area has undermining. Both areas have healthy looking granulation at the base but relative the size of the wound is fairly deep. There is no exposed bone no evidence of infection. Her wound VAC was put on hold last week because of surrounding skin maceration she has been using collagen this week. She has a modified [name] 6/12; patient presents for follow-up. Last week the wound VAC was reinitiated. She had no issues with the wound VAC itself. Today she has maceration again noted to the surrounding skin. She denies signs of infection. 6/27; patient presents for follow-up. She has been using Medihoney to the wound bed. We took a break from the wound VAC because the periwound was macerated. She still has some areas of maceration to the distal foot where there is a callus. She currently denies signs of infection. 7/11; patient presents for follow-up. She has been using Medihoney to the wound bed. She  has developed some increased warmth and erythema to the lateral aspect of the right foot. She states this is occurred over the past week and there is increased pain. No drainage noted. 7/17; patient presents for follow-up. She has been using Medihoney to the wound bed. She completed her course of Bactrim. She reports improvement in symptoms. 7/24; Patient presents for follow up. She has been using Medihoney to the wound bed without issues. She completed another course of Bactrim. She reports improvement in her symptoms but still has some mild tenderness to the medial aspect of the foot. 7/31; Patient presents for follow-up. She has been using Medihoney and Dakin's to the wound bed. She denies signs of infection. 8/14; patient presents for follow-up. She has been using Dakin's wet-to-dry packing strips to the right medial aspect of the amputation site and Medihoney to the anterior site. She has no issues or complaints today. She has started physical therapy. She denies signs of infection. 8/29; patient presents for follow-up. She has been using Dakin's wet-to-dry packing strips to the right medial aspect of the amputation site however this is becoming more difficult to place. She did report that she had increased redness and swelling to that site and developed some drainage. It has resolved. She continues with physical therapy. 9/8; patient presents for follow-up. We have been using silver alginate to the tunneled area. She completed her course of antibiotics. She reports no pain, increased swelling or erythema. 9/21; patient presents for follow-up. She has been using gentamicin to the tunneled area. She has no issues or complaints today. She denies signs of infection. 10/2; patient presents for follow-up. She is scheduled to have her CT scan on 10/9. She currently denies signs of infection. She denies increased warmth, erythema or purulent drainage from the wound bed. She has been using Dakin's  wet-to-dry dressings. 10/12; patient presents for follow-up. She had her CT  scan on 10/9 that showed A small irregular rim-enhancing fluid pocket communicating to the overlying soft tissues of the sinus tract compatible with a small abscess. Currently she denies systemic signs of infection. She has been doing Dakin's wet-to-dry packing strips but it is hard for her to pack into the narrow opening. 10/30; patient presents for follow-up. Since last clinic visit she has had OR debridement of her Right foot by Dr. Kathaleen Bury Due to abscess noted on CT. She has been using iodoform packing. 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 She will complete 2 weeks of this and continue with oral amoxicillin for the next 6 to 12 months. She follows with Dr. Linus Salmons for this. She currently denies signs of infection. 11/13; patient presents for follow-up. Patient has been using silver alginate with gentamicin to the wound bed. She has no issues or complaints today she. She reports improvement in wound healing. 12/4; patient presents for follow-up. She has been using silver alginate and gentamicin to the wound bed. She has no issues or complaints today. 12/8; patient presents for follow-up. She has been using silver alginate with gentamicin to the wound bed. She presents for her first cast placement. She will be back early next week for her obligatory cast change. 12/12; patient presents for follow-up. We have been using collagen with antibiotic ointment to the wound bed under a total contact cast. She has tolerated this well. She is improved in wound healing. Electronic Signature(s) Signed: 12/25/2021 4:30:17 PM By: Kalman Shan DO Entered By: Kalman Shan on 12/25/2021 14:47:03 -------------------------------------------------------------------------------- Physical Exam Details Patient Name: Date of Service: [name] V IS, [name] 12/25/2021 2:00 PM Medical Record  Number: [id number] Patient Account Number: [id number] Date of Birth/Sex: Treating RN: 1980-09-30 (41 y.o. F) Primary Care Provider: Juluis Mire Other Clinician: Referring Provider: Treating Provider/Extender: Edmonia Lynch in Treatment: [address], [place]anda Bumps ([id number]) [id number] Page 4 of 11 respirations regular, non-labored and within target range for patient.. Cardiovascular 2+ dorsalis pedis/posterior tibialis pulses. Psychiatric pleasant and cooperative. Notes Right foot: T the transmetatarsal amputation site there is an open wound with granulation tissue, slough and callus with a tunnel to the medial aspect. No o increased warmth, erythema or purulent drainage noted. Electronic Signature(s) Signed: 12/25/2021 4:30:17 PM By: Kalman Shan DO Entered By: Kalman Shan on 12/25/2021 14:47:49 -------------------------------------------------------------------------------- Physician Orders Details Patient Name: Date of Service: [name] V IS, [name] 12/25/2021 2:00 PM Medical Record Number: [id number] Patient Account Number: [id number] Date of Birth/Sex: Treating RN: [birth year] (41 y.o. [name], Meta.Reding Primary Care Provider: Juluis Mire Other Clinician: Referring Provider: Treating Provider/Extender: Edmonia Lynch in Treatment: [phone] Verbal / Phone Orders: No Diagnosis Coding ICD-10 Coding Code Description L97.514 Non-pressure chronic ulcer of other part of right foot with necrosis of bone E11.621 Type 2 diabetes mellitus with foot ulcer M86.9 Osteomyelitis, unspecified Follow-up Appointments ppointment in 1 week. - Dr. Heber [place] Monday 12/31/2021 Return A ppointment in 2 weeks. - Dr. Heber [place] Monday Return A Anesthetic (In clinic) Topical Lidocaine 5% applied to wound bed (In clinic) Topical Lidocaine 4% applied to wound bed Bathing/ Shower/ Hygiene Other  Bathing/Shower/Hygiene Orders/Instructions: - Clean with Saline or Dakins Edema Control - Lymphedema / SCD / Other Elevate legs to the level of the heart or above for 30 minutes daily and/or when sitting, a frequency of: - throughout the day void standing for long periods of time. - Limit time/pressure on feet, reduce  PT Use wheelchair instead of walker when you can. . A Moisturize legs daily. Off-Loading Total Contact Cast to Right Lower Extremity - *****SIZE 3!!!!**** Additional Orders / Instructions Follow Nutritious Diet - -Monitor/Control Blood Sugar -High Protein Diet Wound Treatment Wound #2 - Amputation Site - Transmetatarsal Wound Laterality: Right, Medial Cleanser: Normal Saline (Generic) 1 x Per Week/30 Days Discharge Instructions: Cleanse the wound with Normal Saline prior to applying a clean dressing using gauze sponges, not tissue or cotton balls. Topical: Gentamicin 1 x Per Week/30 Days Discharge Instructions: As directed by physician Topical: Mupirocin Ointment 1 x Per Week/30 Days Discharge Instructions: Apply Mupirocin (Bactroban) as instructed Sipos, Oaklie L ([id number] Page 5 of 11 Prim Dressing: Promogran Prisma Matrix, 4.34 (sq in) (silver collagen) 1 x Per Week/30 Days ary Discharge Instructions: Moisten collagen with saline or hydrogel Secondary Dressing: Woven Gauze Sponge, Non-Sterile 4x4 in (Generic) 1 x Per Week/30 Days Discharge Instructions: Apply over primary dressing as directed. Secured With: [employer], 4.5x3.1 (in/yd) (Generic) 1 x Per Week/30 Days Discharge Instructions: Secure with Kerlix as directed. Electronic Signature(s) Signed: 12/25/2021 4:30:17 PM By: Kalman Shan DO Signed: 12/25/2021 4:43:40 PM By: [name] RN, BSN Entered By: [name] on 12/25/2021 15:27:44 -------------------------------------------------------------------------------- Problem List Details Patient Name:  Date of Service: [name] V IS, [name] 12/25/2021 2:00 PM Medical Record Number: [id number] Patient Account Number: [id number] Date of Birth/Sex: Treating RN: 03-May-1980 (41 y.o. [name] Primary Care Provider: Juluis Mire Other Clinician: Referring Provider: Treating Provider/Extender: Edmonia Lynch in Treatment: [phone] Active Problems ICD-10 Encounter Code Description Active Date MDM Diagnosis L97.514 Non-pressure chronic ulcer of other part of right foot with necrosis of bone 01/22/2021 No Yes E11.621 Type 2 diabetes mellitus with foot ulcer 01/22/2021 No Yes M86.9 Osteomyelitis, unspecified 01/22/2021 No Yes Inactive Problems Resolved Problems Electronic Signature(s) Signed: 12/25/2021 4:30:17 PM By: Kalman Shan DO Entered By: Kalman Shan on 12/25/2021 14:46:18 -------------------------------------------------------------------------------- Progress Note Details Patient Name: Date of Service: [name] V IS, [name] 12/25/2021 2:00 PM Medical Record Number: [id number] Patient Account Number: [id number] Date of Birth/Sex: Treating RN: 21-May-1980 (41 y.o. F) Primary Care Provider: Juluis Mire Other Clinician: Referring Provider: Treating Provider/Extender: Geryl Councilman [place], [place] Carlean Jews ([id number]) [id number] Page 6 of 11 Weeks in Treatment: 82 Subjective Chief Complaint Information obtained from Patient Osteomyelitis of the right foot status post transmetatarsal amputation with surgical site dehiscence History of Present Illness (HPI) Admission 01/22/2021 [name] is a 41 year old female with a past medical history of insulin-dependent uncontrolled type 2 diabetes with last hemoglobin A1c of 13.5, osteomyelitis of the right foot status post transmetatarsal amputation on 12/18/2020 that presents to the clinic for right foot wound. She has had dehiscence of the surgical site. She is  currently using wet-to-dry dressings. She has a PICC line and receiving IV ceftriaxone daily for her osteomyelitis. There is an end date of 01/27/2021. She is also taking oral metronidazole. She currently denies systemic signs of infection. 1/19; patient presents for follow-up. She was diagnosed with a DVT to the right lower extremity 2 days ago. She is on Eliquis now. She is scheduled to see her infectious disease doctor tomorrow. She has been using Dakin's wet-to-dry dressings. She denies systemic signs of infection. 1/26; patient presents for follow-up. She saw infectious disease on 1/21 started on Augmentin. Her PICC line and IV ceftriaxone was discontinued. Patient reports stability to her wound. She has been using Dakin's wet-to-dry dressings. She currently  denies systemic signs of infection. 2/3; patient presents for follow-up. She continues to use Dakin's wet-to-dry dressings to the wound bed. She saw Dr. Yvette Rack with infectious disease yesterday and is continuing Augmentin. T entative end date is 2/16. Patient reports following up with orthopedics. She states there is no further plan from them. She currently denies systemic signs of infection. 2/10; patient presents for follow-up. She continues to use Dakin's wet to dry dressings. She is scheduled to have her MRI done on 2/14. She states that she had pain to the debridement site from last clinic visit and declines debridement today. She denies systemic signs of infection. She continues to have yellow thick drainage. 2/20; patient presents for follow-up. She continues to use Dakin's wet-to-dry dressings. She obtained her MRI. The results showed an abscess and she is scheduled to see her orthopedic surgeon on 2/23. She saw infectious disease 2/17 and her antibiotics were extended. She currently denies systemic signs of infection. 3/6; patient presents for follow-up. She had debridement and irrigation of her foot on 03/10/2021 due to abscess  noted on MRI. She was started on IV ceftriaxone and oral Flagyl. She has no issues or complaints today. She has been using iodoform packing to the tunnel and Dakin's wet-to-dry to the opening. 03/26/2021: She continues on IV ceftriaxone and oral metronidazole. She has follow-up with infectious disease tomorrow. No significant issues or complaints today. Her mother continues to help her with her wound dressing, using iodoform packing strips into the tunnel and Dakin's to the open portion of the wound. 3/20; patient presents for follow-up. She continues to be on IV ceftriaxone in oral metronidazole. She has been using iodoform to the tunnel and Dakin's wet-to- dry to the open wound. She denies signs of infection. 3/27; patient presents for follow-up. She no longer has a PICC line. She has been using iodoform to the tunnel and Dakin's wet-to-dry to the open wound. She reports improvement in wound healing. She denies signs of infection. 4/3; patient presents for follow-up. She states she has been using Hydrofera Blue to the open wound and iodoform packing to the tunnel without any issues. She denies signs of infection. 4/18; patient presents for follow-up. She saw infectious disease on 4/11. She has finished her oral antibiotics and completed a total of 6 weeks of antibiotics (this includes IV as well). No further antibiotics needed. She has been using Hydrofera Blue and iodoform packing. She states that the tunneled area has come in and the iodoform is not staying in place anymore. She has no issues or complaints today. She denies signs of infection. 4/24; patient presents for follow-up. She saw Dr. Despina Pole, plastic surgery to discuss potential skin graft/substitute placement. At this time he thinks that the skin graft would likely not take. He is in agreement with trying a wound VAC. Patient has been using Hydrofera Blue dressing changes with no issues. She denies signs of infection. She reports  improvement in wound healing. 5/1; patient presents for follow-up. Unfortunately patient did not have insurance when we ran for the pico. There is an assistance program and we are trying to get this accommodated for the patient. In the meantime she has been using Hydrofera Blue without any issues. She denies signs of infection. 5/8; patient presents for follow-up. We have not heard back if pico is covered by her insurance. She has been using collagen to the wound bed over the past week. She denies signs of infection. 5/18; patient presents for follow-up. She has been  using collagen to the wound bed without issues. Again we have not heard if pico is covered by her insurance. She has no issues or complaints today. 5/23; patient presents for follow-up. She has been using collagen to the wound bed. She has no issues or complaints today. She obtained the wound VAC from [facility] and brought this in today. She denies signs of infection. 6/1; patient presents for follow-up. She has been using the wound VAC for the past week. She has had this changed twice since she was last here. She reports more maceration to the periwound. She denies signs of infection. 6/7; right TMA site. There are 2 wounds 0 separated by a bridge of healed tissue. The more lateral area has undermining. Both areas have healthy looking granulation at the base but relative the size of the wound is fairly deep. There is no exposed bone no evidence of infection. Her wound VAC was put on hold last week because of surrounding skin maceration she has been using collagen this week. She has a modified [name] 6/12; patient presents for follow-up. Last week the wound VAC was reinitiated. She had no issues with the wound VAC itself. Today she has maceration again noted to the surrounding skin. She denies signs of infection. 6/27; patient presents for follow-up. She has been using Medihoney to the wound bed. We took a break from the wound VAC because the  periwound was macerated. She still has some areas of maceration to the distal foot where there is a callus. She currently denies signs of infection. 7/11; patient presents for follow-up. She has been using Medihoney to the wound bed. She has developed some increased warmth and erythema to the lateral aspect of the right foot. She states this is occurred over the past week and there is increased pain. No drainage noted. 7/17; patient presents for follow-up. She has been using Medihoney to the wound bed. She completed her course of Bactrim. She reports improvement in symptoms. 7/24; Patient presents for follow up. She has been using Medihoney to the wound bed without issues. She completed another course of Bactrim. She reports improvement in her symptoms but still has some mild tenderness to the medial aspect of the foot. RACQUELLE, EALES ([id number]) [id number] Page 7 of 11 7/31; Patient presents for follow-up. She has been using Medihoney and Dakin's to the wound bed. She denies signs of infection. 8/14; patient presents for follow-up. She has been using Dakin's wet-to-dry packing strips to the right medial aspect of the amputation site and Medihoney to the anterior site. She has no issues or complaints today. She has started physical therapy. She denies signs of infection. 8/29; patient presents for follow-up. She has been using Dakin's wet-to-dry packing strips to the right medial aspect of the amputation site however this is becoming more difficult to place. She did report that she had increased redness and swelling to that site and developed some drainage. It has resolved. She continues with physical therapy. 9/8; patient presents for follow-up. We have been using silver alginate to the tunneled area. She completed her course of antibiotics. She reports no pain, increased swelling or erythema. 9/21; patient presents for follow-up. She has been using gentamicin to the  tunneled area. She has no issues or complaints today. She denies signs of infection. 10/2; patient presents for follow-up. She is scheduled to have her CT scan on 10/9. She currently denies signs of infection. She denies increased warmth, erythema or purulent drainage from the wound bed. She  has been using Dakin's wet-to-dry dressings. 10/12; patient presents for follow-up. She had her CT scan on 10/9 that showed A small irregular rim-enhancing fluid pocket communicating to the overlying soft tissues of the sinus tract compatible with a small abscess. Currently she denies systemic signs of infection. She has been doing Dakin's wet-to-dry packing strips but it is hard for her to pack into the narrow opening. 10/30; patient presents for follow-up. Since last clinic visit she has had OR debridement of her Right foot by Dr. Kathaleen Bury Due to abscess noted on CT. She has been using iodoform packing. 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 She will complete 2 weeks of this and continue with oral amoxicillin for the next 6 to 12 months. She follows with Dr. Linus Salmons for this. She currently denies signs of infection. 11/13; patient presents for follow-up. Patient has been using silver alginate with gentamicin to the wound bed. She has no issues or complaints today she. She reports improvement in wound healing. 12/4; patient presents for follow-up. She has been using silver alginate and gentamicin to the wound bed. She has no issues or complaints today. 12/8; patient presents for follow-up. She has been using silver alginate with gentamicin to the wound bed. She presents for her first cast placement. She will be back early next week for her obligatory cast change. 12/12; patient presents for follow-up. We have been using collagen with antibiotic ointment to the wound bed under a total contact cast. She has tolerated this well. She is improved in wound healing. Patient  History Information obtained from Patient. Family History Cancer - Paternal Grandparents, Diabetes - Mother, Hypertension - Mother, Stroke - Maternal Grandparents, No family history of Heart Disease, Hereditary Spherocytosis, Kidney Disease, Lung Disease, Seizures, Thyroid Problems, Tuberculosis. Social History Never smoker, Marital Status - Single, Alcohol Use - Rarely, Drug Use - Prior History - Marijuana, Caffeine Use - Daily. Medical History Cardiovascular Patient has history of Hypertension Endocrine Patient has history of Type II Diabetes Musculoskeletal Patient has history of Osteomyelitis - Right Transmet 12/18/20 Neurologic Patient has history of Neuropathy Hospitalization/Surgery History - inpatient right foot abscess 10/12-10/18/2023. Objective Constitutional respirations regular, non-labored and within target range for patient.. Vitals Time Taken: 2:21 PM, Height: 69 in, Temperature: 98.7 F, Pulse: 74 bpm, Respiratory Rate: 17 breaths/min, Blood Pressure: 93/68 mmHg, Capillary Blood Glucose: 129 mg/dl. Cardiovascular 2+ dorsalis pedis/posterior tibialis pulses. Psychiatric pleasant and cooperative. General Notes: Right foot: T the transmetatarsal amputation site there is an open wound with granulation tissue, slough and callus with a tunnel to the medial o aspect. No increased warmth, erythema or purulent drainage noted. Integumentary (Hair, Skin) Wound #2 status is Open. Original cause of wound was Surgical Injury. The date acquired was: 07/10/2021. The wound has been in treatment 24 weeks. The JORDEN, PENDERGAST ([id number]) [id number] Page 8 of 11 wound is located on the Right,Medial Amputation Site - Transmetatarsal. The wound measures 0.3cm length x 0.7cm width x 0.5cm depth; 0.165cm^2 area and 0.082cm^3 volume. There is Fat Layer (Subcutaneous Tissue) exposed. There is no tunneling or undermining noted. There is a medium amount  of serosanguineous drainage noted. The wound margin is distinct with the outline attached to the wound base. There is large (67-100%) pink, pale granulation within the wound bed. There is a small (1-33%) amount of necrotic tissue within the wound bed. The periwound skin appearance had no abnormalities noted for color. The periwound skin appearance exhibited: Callus. The periwound skin  appearance did not exhibit: Crepitus, Excoriation, Induration, Rash, Scarring, Dry/Scaly, Maceration. Periwound temperature was noted as No Abnormality. Assessment Active Problems ICD-10 Non-pressure chronic ulcer of other part of right foot with necrosis of bone Type 2 diabetes mellitus with foot ulcer Osteomyelitis, unspecified Patient's wound has shown improvement in size and appearance since last clinic visit. I debrided nonviable tissue. I recommended continuing the course with collagen and antibiotic ointment under the total contact cast. Follow-up in 1 week. Procedures Wound #2 Pre-procedure diagnosis of Wound #2 is a Diabetic Wound/Ulcer of the Lower Extremity located on the Right,Medial Amputation Site - Transmetatarsal .Severity of Tissue Pre Debridement is: Fat layer exposed. There was a Excisional Skin/Subcutaneous Tissue Debridement with a total area of 1 sq cm performed by Geralyn Corwin, DO. With the following instrument(s): Curette to remove Viable and Non-Viable tissue/material. Material removed includes Callus, Subcutaneous Tissue, and Slough after achieving pain control using Lidocaine 4% Topical Solution. A time out was conducted at 14:26, prior to the start of the procedure. A Minimum amount of bleeding was controlled with Pressure. The procedure was tolerated well with a pain level of 0 throughout and a pain level of 0 following the procedure. Post Debridement Measurements: 0.3cm length x 0.7cm width x 0.5cm depth; 0.082cm^3 volume. Character of Wound/Ulcer Post Debridement is improved.  Severity of Tissue Post Debridement is: Fat layer exposed. Post procedure Diagnosis Wound #2: Same as Pre-Procedure Pre-procedure diagnosis of Wound #2 is a Diabetic Wound/Ulcer of the Lower Extremity located on the Right,Medial Amputation Site - Transmetatarsal . There was a T Contact Cast Procedure by Geralyn Corwin, DO. otal Post procedure Diagnosis Wound #2: Same as Pre-Procedure Notes: size 4.. Plan Follow-up Appointments: Return Appointment in 1 week. - Dr. Mikey Bussing Monday 12/31/2021 Return Appointment in 2 weeks. - Dr. Mikey Bussing Monday Anesthetic: (In clinic) Topical Lidocaine 5% applied to wound bed (In clinic) Topical Lidocaine 4% applied to wound bed Bathing/ Shower/ Hygiene: Other Bathing/Shower/Hygiene Orders/Instructions: - Clean with Saline or Dakins Edema Control - Lymphedema / SCD / Other: Elevate legs to the level of the heart or above for 30 minutes daily and/or when sitting, a frequency of: - throughout the day Avoid standing for long periods of time. - Limit time/pressure on feet, reduce PT Use wheelchair instead of walker when you can. . Moisturize legs daily. Off-Loading: T Contact Cast to Right Lower Extremity - *****SIZE 4!!!!**** otal Additional Orders / Instructions: Follow Nutritious Diet - -Monitor/Control Blood Sugar -High Protein Diet WOUND #2: - Amputation Site - Transmetatarsal Wound Laterality: Right, Medial Cleanser: Normal Saline (Generic) 1 x Per Week/30 Days Discharge Instructions: Cleanse the wound with Normal Saline prior to applying a clean dressing using gauze sponges, not tissue or cotton balls. Topical: Gentamicin 1 x Per Week/30 Days Discharge Instructions: As directed by physician Topical: Mupirocin Ointment 1 x Per Week/30 Days Discharge Instructions: Apply Mupirocin (Bactroban) as instructed Prim Dressing: Promogran Prisma Matrix, 4.34 (sq in) (silver collagen) 1 x Per Week/30 Days ary Discharge Instructions: Moisten collagen with  saline or hydrogel Secondary Dressing: Woven Gauze Sponge, Non-Sterile 4x4 in (Generic) 1 x Per Week/30 Days Discharge Instructions: Apply over primary dressing as directed. Secured With: [employer], 4.5x3.1 (in/yd) (Generic) 1 x Per Week/30 Days Discharge Instructions: Secure with Kerlix as directed. 1. In office sharp debridement 2. Collagen with antibiotic ointment 3. T contact cast placed in standard fashion otal 4. Follow-up in 1 week SHANDRICA, PRASKA ([id number]) [phone].pdf Page 9 of 11 Electronic Signature(s) Signed: 12/25/2021 4:30:17  PM By: Geralyn Corwin DO Entered By: Geralyn Corwin on 12/25/2021 14:48:49 -------------------------------------------------------------------------------- HxROS Details Patient Name: Date of Service: [name] V IS, [name] 12/25/2021 2:00 PM Medical Record Number: [id number] Patient Account Number: [id number] Date of Birth/Sex: Treating RN: [birth year] (41 y.o. F) Primary Care Provider: Gwinda Passe Other Clinician: Referring Provider: Treating Provider/Extender: Grace Isaac in Treatment: 48 Information Obtained From Patient Cardiovascular Medical History: Positive for: Hypertension Endocrine Medical History: Positive for: Type II Diabetes Time with diabetes: Dx 2009 Treated with: Insulin, Oral agents Blood sugar tested every day: Yes Tested : daily Musculoskeletal Medical History: Positive for: Osteomyelitis - Right Transmet 12/18/20 Neurologic Medical History: Positive for: Neuropathy Immunizations Pneumococcal Vaccine: Received Pneumococcal Vaccination: No Implantable Devices Yes Hospitalization / Surgery History Type of Hospitalization/Surgery inpatient right foot abscess 10/12-10/18/2023 Family and Social History Cancer: Yes - Paternal Grandparents; Diabetes: Yes - Mother; Heart Disease: No; Hereditary Spherocytosis: No; Hypertension: Yes - Mother;  Kidney Disease: No; Lung Disease: No; Seizures: No; Stroke: Yes - Maternal Grandparents; Thyroid Problems: No; Tuberculosis: No; Never smoker; Marital Status - Single; Alcohol Use: Rarely; Drug Use: Prior History - Marijuana; Caffeine Use: Daily; Financial Concerns: No; Food, Clothing or Shelter Needs: No; Support System Lacking: No; Transportation Concerns: No Electronic Signature(s) Signed: 12/25/2021 4:30:17 PM By: Geralyn Corwin DO Entered By: Geralyn Corwin on 12/25/2021 14:47:13 Faiella, Sharol Harness ([id number]) [id number] Page 10 of 11 -------------------------------------------------------------------------------- Total Contact Cast Details Patient Name: Date of Service: [name] V IS, [name] 12/25/2021 2:00 PM Medical Record Number: [id number] Patient Account Number: [id number] Date of Birth/Sex: Treating RN: Nov 13, 1980 (41 y.o. [name] Primary Care Provider: Gwinda Passe Other Clinician: Referring Provider: Treating Provider/Extender: Grace Isaac in Treatment: [phone] T Contact Cast Applied for Wound Assessment: otal Wound #2 Right,Medial Amputation Site - Transmetatarsal Performed By: Physician Geralyn Corwin, DO Post Procedure Diagnosis Same as Pre-procedure Notes size 4. Electronic Signature(s) Signed: 12/25/2021 4:30:17 PM By: Geralyn Corwin DO Signed: 12/25/2021 4:43:40 PM By: Shawn Stall RN, BSN Entered By: Shawn Stall on 12/25/2021 14:34:28 -------------------------------------------------------------------------------- SuperBill Details Patient Name: Date of Service: [name] V IS, [name] 12/25/2021 Medical Record Number: [id number] Patient Account Number: [id number] Date of Birth/Sex: Treating RN: Oct 21, 1980 (41 y.o. [name], Millard.Loa Primary Care Provider: Gwinda Passe Other Clinician: Referring Provider: Treating Provider/Extender: Grace Isaac in Treatment:  48 Diagnosis Coding ICD-10 Codes Code Description [id number] Non-pressure chronic ulcer of other part of right foot with necrosis of bone E11.621 Type 2 diabetes mellitus with foot ulcer M86.9 Osteomyelitis, unspecified Facility Procedures : CPT4 Code: [id number] Description: 11042 - DEB SUBQ TISSUE 20 SQ CM/< ICD-10 Diagnosis Description L97.514 Non-pressure chronic ulcer of other part of right foot with necrosis of bone E11.621 Type 2 diabetes mellitus with foot ulcer Modifier: Quantity: 1 Physician Procedures : CPT4 Code Description Modifier [id number] 11042 - WC PHYS SUBQ TISS 20 SQ CM ICD-10 Diagnosis Description L97.514 Non-pressure chronic ulcer of other part of right foot with necrosis of bone E11.621 Type 2 diabetes mellitus with foot ulcer Quantity: 1 Electronic Signature(s) Ohlin, Remy L ([id number]) [id number] Page 11 of 11 Signed: 12/25/2021 4:30:17 PM By: Geralyn Corwin DO Entered By: Geralyn Corwin on 12/25/2021 14:48:57

## 2021-12-27 NOTE — Progress Notes (Signed)
[name], MCCLEAVE ([id number]) [id number] Page 1 of 8 Visit Report for 12/25/2021 Arrival Information Details Patient Name: Date of Service: [name] IS, [name] [name] 12/25/2021 2:00 PM Medical Record Number: [id number] Patient Account Number: [id number] Date of Birth/Sex: Treating RN: 06/09/1980 (41 y.o. Tonita Phoenix, [name] Primary Care Duell Holdren: Juluis Mire Other Clinician: Referring Vernessa Likes: Treating Lyndie Vanderloop/Extender: Edmonia Lynch in Treatment: 32 Visit Information History Since Last Visit Added or deleted any medications: No Patient Arrived: Wheel Chair Any new allergies or adverse reactions: No Arrival Time: 14:22 Had a fall or experienced change in No Accompanied By: self activities of daily living that may affect Transfer Assistance: Manual risk of falls: Patient Identification Verified: Yes Signs or symptoms of abuse/neglect since last visito No Secondary Verification Process Completed: Yes Hospitalized since last visit: No Patient Requires Transmission-Based No Implantable device outside of the clinic excluding No Precautions: cellular tissue based products placed in the center Patient Has Alerts: Yes since last visit: Patient Alerts: Patient on Blood Thinner Has Dressing in Place as Prescribed: Yes PICC R Arm Pain Present Now: No ABI 12/17/20 R=1.08 L=1.13 Electronic Signature(s) Signed: 12/27/2021 5:16:09 PM By: Rhae Hammock RN Entered By: Rhae Hammock on 12/25/2021 14:23:16 -------------------------------------------------------------------------------- Encounter Discharge Information Details Patient Name: Date of Service: [name] V IS, [name] 12/25/2021 2:00 PM Medical Record Number: [id number] Patient Account Number: [id number] Date of Birth/Sex: Treating RN: 1980-05-29 (41 y.o. [name] Primary Care Taiyana Kissler: Juluis Mire Other Clinician: Referring Adellyn Capek: Treating  Londynn Sonoda/Extender: Edmonia Lynch in Treatment: 48 Encounter Discharge Information Items Post Procedure Vitals Discharge Condition: Stable Temperature (F): 98.7 Ambulatory Status: Wheelchair Pulse (bpm): 74 Discharge Destination: Home Respiratory Rate (breaths/min): 20 Transportation: Private Auto Blood Pressure (mmHg): 93/68 Accompanied By: self Schedule Follow-up Appointment: Yes Clinical Summary of Care: Electronic Signature(s) Signed: 12/25/2021 4:43:40 PM By: [name] RN, BSN Entered By: [name] on 12/25/2021 14:37:45 [name] ([id number]) [id number] Page 2 of 8 -------------------------------------------------------------------------------- Lower Extremity Assessment Details Patient Name: Date of Service: [name] IS, [name] 12/25/2021 2:00 PM Medical Record Number: [id number] Patient Account Number: [id number] Date of Birth/Sex: Treating RN: Oct 21, 1980 (41 y.o. Tonita Phoenix, [name] Primary Care Severina Sykora: Juluis Mire Other Clinician: Referring Albertina Leise: Treating Kayd Launer/Extender: Geryl Councilman Weeks in Treatment: 48 Edema Assessment Assessed: [Left: No] Patrice Paradise: No] Edema: [Left: Ye] [Right: s] Calf Left: Right: Point of Measurement: 34 cm From Medial Instep 48 cm Ankle Left: Right: Point of Measurement: 8 cm From Medial Instep 27 cm Vascular Assessment Pulses: Dorsalis Pedis Palpable: [Right:Yes] Posterior Tibial Palpable: [Right:Yes] Electronic Signature(s) Signed: 12/27/2021 5:16:09 PM By: Rhae Hammock RN Entered By: Rhae Hammock on 12/25/2021 14:21:15 -------------------------------------------------------------------------------- Multi Wound Chart Details Patient Name: Date of Service: [name] V IS, [name] 12/25/2021 2:00 PM Medical Record Number: [id number] Patient Account Number: [id number] Date of Birth/Sex: Treating RN: [birth year] (41 y.o.  F) Primary Care Osmin Welz: Juluis Mire Other Clinician: Referring Shatima Zalar: Treating Jhan Conery/Extender: Edmonia Lynch in Treatment: 48 Vital Signs Height(in): 61 Capillary Blood Glucose(mg/dl): 129 Weight(lbs): Pulse(bpm): 21 Body Mass Index(BMI): Blood Pressure(mmHg): 93/68 Temperature(F): 98.7 Respiratory Rate(breaths/min): 17 [2:Photos:] [N/A:N/A] Right, Medial Amputation Site - N/A N/A Wound Location: Transmetatarsal Surgical Injury N/A N/A Wounding Event: Diabetic Wound/Ulcer of the Lower N/A N/A Primary Etiology: Extremity Open Surgical Wound N/A N/A Secondary Etiology: Hypertension, Type II Diabetes, N/A N/A Comorbid History: Osteomyelitis, Neuropathy 07/10/2021 N/A N/A Date Acquired: 24 N/A N/A Weeks of Treatment: Open N/A N/A Wound  Status: No N/A N/A Wound Recurrence: 0.3x0.7x0.5 N/A N/A Measurements L x W x D (cm) 0.165 N/A N/A A (cm) : rea 0.082 N/A N/A Volume (cm) : -39.80% N/A N/A % Reduction in A rea: -134.30% N/A N/A % Reduction in Volume: Grade 3 N/A N/A Classification: Medium N/A N/A Exudate A mount: Serosanguineous N/A N/A Exudate Type: red, brown N/A N/A Exudate Color: Distinct, outline attached N/A N/A Wound Margin: Large (67-100%) N/A N/A Granulation A mount: Pink, Pale N/A N/A Granulation Quality: Small (1-33%) N/A N/A Necrotic A mount: Fat Layer (Subcutaneous Tissue): Yes N/A N/A Exposed Structures: Fascia: No Tendon: No Muscle: No Joint: No Bone: No None N/A N/A Epithelialization: Debridement - Excisional N/A N/A Debridement: Pre-procedure Verification/Time Out 14:26 N/A N/A Taken: Lidocaine 4% Topical Solution N/A N/A Pain Control: Callus, Subcutaneous, Slough N/A N/A Tissue Debrided: Skin/Subcutaneous Tissue N/A N/A Level: 1 N/A N/A Debridement A (sq cm): rea Curette N/A N/A Instrument: Minimum N/A N/A Bleeding: Pressure N/A N/A Hemostasis A chieved: 0 N/A  N/A Procedural Pain: 0 N/A N/A Post Procedural Pain: Procedure was tolerated well N/A N/A Debridement Treatment Response: 0.3x0.7x0.5 N/A N/A Post Debridement Measurements L x W x D (cm) 0.082 N/A N/A Post Debridement Volume: (cm) Callus: Yes N/A N/A Periwound Skin Texture: Excoriation: No Induration: No Crepitus: No Rash: No Scarring: No Maceration: No N/A N/A Periwound Skin Moisture: Dry/Scaly: No Atrophie Blanche: No N/A N/A Periwound Skin Color: Cyanosis: No Ecchymosis: No Erythema: No Hemosiderin Staining: No Mottled: No Pallor: No Rubor: No No Abnormality N/A N/A Temperature: Debridement N/A N/A Procedures Performed: T Contact Cast otal Treatment Notes Wound #2 (Amputation Site - Transmetatarsal) Wound Laterality: Right, Medial Cleanser Normal Saline Discharge Instruction: Cleanse the wound with Normal Saline prior to applying a clean dressing using gauze sponges, not tissue or cotton balls. Peri-Wound Care Topical Gentamicin Discharge Instruction: As directed by physician Mupirocin Ointment [place], Mende L ([id number]) [phone].pdf Page 4 of 8 Discharge Instruction: Apply Mupirocin (Bactroban) as instructed Primary Dressing Promogran Prisma Matrix, 4.34 (sq in) (silver collagen) Discharge Instruction: Moisten collagen with saline or hydrogel Secondary Dressing Woven Gauze Sponge, Non-Sterile 4x4 in Discharge Instruction: Apply over primary dressing as directed. Secured With [employer], 4.5x3.1 (in/yd) Discharge Instruction: Secure with Kerlix as directed. Compression Wrap Compression Stockings Add-Ons Electronic Signature(s) Signed: 12/25/2021 4:30:17 PM By: Kalman Shan DO Entered By: Kalman Shan on 12/25/2021 14:46:24 -------------------------------------------------------------------------------- Multi-Disciplinary Care Plan Details Patient Name: Date of Service: [name] V IS, [name] 12/25/2021 2:00  PM Medical Record Number: [id number] Patient Account Number: [id number] Date of Birth/Sex: Treating RN: 03-24-1980 (41 y.o. [name] Primary Care Wael Maestas: Juluis Mire Other Clinician: Referring Sebrena Engh: Treating Jerae Izard/Extender: Edmonia Lynch in Treatment: 48 Active Inactive Nutrition Nursing Diagnoses: Impaired glucose control: actual or potential Goals: Patient/caregiver verbalizes understanding of need to maintain therapeutic glucose control per primary care physician Date Initiated: 01/22/2021 Target Resolution Date: 01/12/2022 Goal Status: Active Interventions: Assess HgA1c results as ordered upon admission and as needed Provide education on elevated blood sugars and impact on wound healing Treatment Activities: Obtain HgA1c : 01/22/2021 Notes: 07/10/21: Glucose control ongoing 09/11/21 : Glucose control ongoing, patient not compliant in checking glucose. Wound/Skin Impairment Nursing Diagnoses: Impaired tissue integrity Goals: Patient/caregiver will verbalize understanding of skin care regimen Date Initiated: 01/22/2021 Target Resolution Date: 01/12/2022 Goal Status: Active Ulcer/skin breakdown will have a volume reduction of 30% by week 4 Kercher, Priscilla L ([id number]) [id number] Page 5 of 8 Date Initiated: 01/22/2021 Date Inactivated: 03/19/2021  Target Resolution Date: 03/16/2021 Goal Status: Met Ulcer/skin breakdown will have a volume reduction of 50% by week 8 Date Initiated: 03/19/2021 Date Inactivated: 05/14/2021 Target Resolution Date: 04/16/2021 Unmet Reason: see wound Goal Status: Unmet measurements Interventions: Assess patient/caregiver ability to obtain necessary supplies Assess patient/caregiver ability to perform ulcer/skin care regimen upon admission and as needed Assess ulceration(s) every visit Provide education on ulcer and skin care Treatment Activities: Topical wound management initiated :  01/22/2021 Notes: 06/04/21: Wound vac started 07/10/21: Wound care regimen continues Electronic Signature(s) Signed: 12/25/2021 4:43:40 PM By: [name] RN, BSN Entered By: [name] on 12/25/2021 14:32:43 -------------------------------------------------------------------------------- Pain Assessment Details Patient Name: Date of Service: [name] V IS, [name] 12/25/2021 2:00 PM Medical Record Number: [id number] Patient Account Number: [id number] Date of Birth/Sex: Treating RN: 1980-10-19 (41 y.o. Tonita Phoenix, [name] Primary Care Thersia Petraglia: Juluis Mire Other Clinician: Referring Aalijah Lanphere: Treating Wenona [place]/Extender: Edmonia Lynch in Treatment: 48 Active Problems Location of Pain Severity and Description of Pain Patient Has Paino No Site Locations Pain Management and Medication Current Pain Management: Electronic Signature(s) Signed: 12/27/2021 5:16:09 PM By: Rhae Hammock RN Entered By: Rhae Hammock on 12/25/2021 14:21:43 [place], Shanda [name] ([id number]) [id number] Page 6 of 8 -------------------------------------------------------------------------------- Patient/Caregiver Education Details Patient Name: Date of Service: [name] IS, [name] [name] 12/12/2023andnbsp2:00 PM Medical Record Number: [id number] Patient Account Number: [id number] Date of Birth/Gender: Treating RN: [birth year] (41 y.o. [name] Primary Care Physician: Juluis Mire Other Clinician: Referring Physician: Treating Physician/Extender: Edmonia Lynch in Treatment: 33 Education Assessment Education Provided To: Patient Education Topics Provided Wound/Skin Impairment: Handouts: Skin Care Do's and Dont's Methods: Explain/Verbal Responses: Reinforcements needed Electronic Signature(s) Signed: 12/25/2021 4:43:40 PM By: [name] RN, BSN Entered By: [name] on 12/25/2021  14:32:58 -------------------------------------------------------------------------------- Wound Assessment Details Patient Name: Date of Service: [name] V IS, [name] 12/25/2021 2:00 PM Medical Record Number: [id number] Patient Account Number: [id number] Date of Birth/Sex: Treating RN: October 25, 1980 (41 y.o. 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 CHERYEL, KYTE ([id number]) Width: (cm) Depth: (cm) Area: (cm) Volume: (cm) 3 % Reduction in Area: -39.8% [id number] Page 7 of 8 0.7 % Reduction in Volume: -134.3% 0.5 Epithelialization: None 0.165 Tunneling: No 0.082 Undermining: No Wound Description Classification: Grade 3 Wound Margin: Distinct, outline attached Exudate Amount: Medium Exudate Type: Serosanguineous Exudate Color: red, brown Foul Odor After Cleansing: No Slough/Fibrino Yes Wound Bed Granulation Amount: Large (67-100%) Exposed Structure Granulation Quality: Pink, Pale Fascia Exposed: No Necrotic Amount: Small (1-33%) Fat Layer (Subcutaneous Tissue) Exposed: Yes Tendon Exposed: No Muscle Exposed: No Joint Exposed: No Bone Exposed: No Periwound Skin Texture Texture Color No Abnormalities Noted: No No Abnormalities Noted: Yes Callus: Yes Temperature / Pain Crepitus: No Temperature: No Abnormality Excoriation: No Induration: No Rash: No Scarring:  No Moisture No Abnormalities Noted: No Dry / Scaly: No Maceration: No Treatment Notes Wound #2 (Amputation Site - Transmetatarsal) Wound Laterality: Right, Medial Cleanser Normal Saline Discharge Instruction: Cleanse the wound with Normal Saline prior to applying a clean dressing using gauze sponges, not tissue or  cotton balls. Peri-Wound Care Topical Gentamicin Discharge Instruction: As directed by physician Mupirocin Ointment Discharge Instruction: Apply Mupirocin (Bactroban) as instructed Primary Dressing Promogran Prisma Matrix, 4.34 (sq in) (silver collagen) Discharge Instruction: Moisten collagen with saline or hydrogel Secondary Dressing Woven Gauze Sponge, Non-Sterile 4x4 in Discharge Instruction: Apply over primary dressing as directed. Secured With [employer], 4.5x3.1 (in/yd) Discharge Instruction: Secure with Kerlix as directed. Compression Wrap Compression Stockings Add-Ons Electronic Signature(s) Signed: 12/27/2021 5:16:09 PM By: Rhae Hammock RN Entered By: Rhae Hammock on 12/25/2021 14:26:19 [place]ose[place], Shanda [name] ([id number]) [id number] Page 8 of 8 -------------------------------------------------------------------------------- Vitals Details Patient Name: Date of Service: [name] V IS, [name] 12/25/2021 2:00 PM Medical Record Number: [id number] Patient Account Number: [id number] Date of Birth/Sex: Treating RN: [birth year] (41 y.o. Tonita Phoenix, [name] Primary Care Anneliese Leblond: Juluis Mire Other Clinician: Referring Robinette Esters: Treating Owain Eckerman/Extender: Edmonia Lynch in Treatment: 48 Vital Signs Time Taken: 14:21 Temperature (F): 98.7 Height (in): 69 Pulse (bpm): 74 Respiratory Rate (breaths/min): 17 Blood Pressure (mmHg): 93/68 Capillary Blood Glucose (mg/dl): 129 Reference Range: 80 - 120 mg / dl Electronic Signature(s) Signed: 12/27/2021 5:16:09 PM By: Rhae Hammock  RN Entered By: Rhae Hammock on 12/25/2021 14:22:42

## 2021-12-27 NOTE — Progress Notes (Signed)
DYAMOND, TOLOSA ([id number]) [id number] Page 1 of 7 Visit Report for 12/21/2021 Arrival Information Details Patient Name: Date of Service: [name] [name], [name] [name]. 12/21/2021 10:30 A M Medical Record Number: [id number] Patient Account Number: [id number] Date of Birth/Sex: Treating RN: [birth year] (41 y.o. Tonita Phoenix, [name] Primary Care Major Santerre: Juluis Mire Other Clinician: Referring Cambren Helm: Treating Lani Mendiola/Extender: Edmonia Lynch in Treatment: 3 Visit Information History Since Last Visit Added or deleted any medications: No Patient Arrived: Wheel Chair Any new allergies or adverse reactions: No Arrival Time: 10:48 Had a fall or experienced change in No Accompanied By: mom activities of daily living that may affect Transfer Assistance: Manual risk of falls: Patient Identification Verified: Yes Signs or symptoms of abuse/neglect since last visito No Secondary Verification Process Completed: Yes Hospitalized since last visit: No Patient Requires Transmission-Based No Implantable device outside of the clinic excluding No Precautions: cellular tissue based products placed in the center Patient Has Alerts: Yes since last visit: Patient Alerts: Patient on Blood Thinner Has Dressing in Place as Prescribed: Yes PICC R Arm Pain Present Now: No ABI 12/17/20 R=1.08 [name]=1.13 Electronic Signature(s) Signed: 12/27/2021 5:16:09 PM By: Rhae Hammock RN Entered By: Rhae Hammock on 12/21/2021 10:48:57 -------------------------------------------------------------------------------- Encounter Discharge Information Details Patient Name: Date of Service: [name] V IS, [name] [name]. 12/21/2021 10:30 A M Medical Record Number: [id number] Patient Account Number: [id number] Date of Birth/Sex: Treating RN: 1980/11/10 (41 y.o. Tonita Phoenix, [name] Primary Care Minie Roadcap: Juluis Mire Other Clinician: Referring Chantae Soo: Treating  Ayanah Snader/Extender: Edmonia Lynch in Treatment: 59 Encounter Discharge Information Items Discharge Condition: Stable Ambulatory Status: Wheelchair Discharge Destination: Home Transportation: Private Auto Accompanied By: mom Schedule Follow-up Appointment: Yes Clinical Summary of Care: Patient Declined Electronic Signature(s) Signed: 12/27/2021 5:16:09 PM By: Rhae Hammock RN Entered By: Rhae Hammock on 12/21/2021 11:43:43 [name], [name] ([id number]) [id number] Page 2 of 7 -------------------------------------------------------------------------------- Lower Extremity Assessment Details Patient Name: Date of Service: [name] IS, [name] [name]. 12/21/2021 10:30 A M Medical Record Number: [id number] Patient Account Number: [id number] Date of Birth/Sex: Treating RN: 1980/12/24 (40 y.o. Tonita Phoenix, [name] Primary Care Eartha Vonbehren: Juluis Mire Other Clinician: Referring Tashianna Broome: Treating Javarri Segal/Extender: Edmonia Lynch in Treatment: 47 Edema Assessment Assessed: [Left: No] Patrice Paradise: Yes] Edema: [Left: Ye] [Right: s] Calf Left: Right: Point of Measurement: 34 cm From Medial Instep 49.5 cm Ankle Left: Right: Point of Measurement: 8 cm From Medial Instep 27.5 cm Vascular Assessment Pulses: Dorsalis Pedis Palpable: [Right:Yes] Posterior Tibial Palpable: [Right:Yes] Electronic Signature(s) Signed: 12/27/2021 5:16:09 PM By: Rhae Hammock RN Entered By: Rhae Hammock on 12/21/2021 11:00:40 -------------------------------------------------------------------------------- Multi Wound Chart Details Patient Name: Date of Service: [name] V IS, [name] [name]. 12/21/2021 10:30 A M Medical Record Number: [id number] Patient Account Number: [id number] Date of Birth/Sex: Treating RN: [birth year] (41 y.o. F) Primary Care Jaquann Guarisco: Juluis Mire Other Clinician: Referring Amzie Sillas: Treating  Jhan Conery/Extender: Edmonia Lynch in Treatment: 47 Vital Signs Height(in): 69 Capillary Blood Glucose(mg/dl): 129 Weight(lbs): Pulse(bpm): 55 Body Mass Index(BMI): Blood Pressure(mmHg): 108/74 Temperature(F): 98.7 Respiratory Rate(breaths/min): 17 [2:Photos: No Photos Right, Medial Amputation Site - Wound Location: Transmetatarsal Surgical Injury Wounding Event: Diabetic Wound/Ulcer of the Lower Primary Etiology: Extremity Open Surgical Wound Secondary Etiology:] [N/A:N/A N/A N/A N/A N/A] LAQUITA, HARLAN ([id number]) [2:Hypertension, Type II Diabetes, Comorbid History: Osteomyelitis, Neuropathy 07/10/2021 Date Acquired: 23 Weeks of Treatment: Open Wound Status: No Wound Recurrence: 0.3x0.7x0.8 Measurements [name] x W x D (cm) 0.165 A (cm) : rea 0.132  Volume (cm) :  -39.80% % Reduction in A rea: -277.10% % Reduction in Volume: Grade 3 Classification: Medium Exudate A mount: Serosanguineous Exudate Type: red, brown Exudate Color: Distinct, outline attached Wound Margin: Large (67-100%) Granulation A mount: Pink, Pale  Granulation Quality: Small (1-33%) Necrotic A mount: Fat Layer (Subcutaneous Tissue): Yes N/A Exposed Structures: Fascia: No Tendon: No Muscle: No Joint: No Bone: No None Epithelialization: Callus: Yes Periwound Skin Texture: Excoriation: No Induration:  No Crepitus: No Rash: No Scarring: No Maceration: No Periwound Skin Moisture: Dry/Scaly: No Atrophie Blanche: No Periwound Skin Color: Cyanosis: No Ecchymosis: No Erythema: No Hemosiderin Staining: No Mottled: No Pallor: No Rubor: No No Abnormality  Temperature: T Contact Cast otal Procedures Performed:] [N/A:N/A N/A N/A N/A N/A N/A N/A N/A N/A N/A N/A N/A N/A N/A N/A N/A N/A N/A N/A N/A N/A N/A N/A N/A] Treatment Notes Wound #2 (Amputation Site - Transmetatarsal) Wound Laterality: Right, Medial Cleanser Normal Saline Discharge Instruction: Cleanse the wound with Normal Saline prior to applying a clean  dressing using gauze sponges, not tissue or cotton balls. Peri-Wound Care Topical Gentamicin Discharge Instruction: As directed by physician Mupirocin Ointment Discharge Instruction: Apply Mupirocin (Bactroban) as instructed Primary Dressing Promogran Prisma Matrix, 4.34 (sq in) (silver collagen) Discharge Instruction: Moisten collagen with saline or hydrogel Secondary Dressing Woven Gauze Sponge, Non-Sterile 4x4 in Discharge Instruction: Apply over primary dressing as directed. Secured With [employer], 4.5x3.1 (in/yd) Discharge Instruction: Secure with Kerlix as directed. Compression Wrap Compression Stockings Add-Ons Electronic Signature(s) [name], [name] ([id number]) [id number] Page 4 of 7 Signed: 12/21/2021 12:38:41 PM By: Kalman Shan DO Entered By: Kalman Shan on 12/21/2021 12:23:56 -------------------------------------------------------------------------------- Multi-Disciplinary Care Plan Details Patient Name: Date of Service: [name] V IS, [name] [name]. 12/21/2021 10:30 A M Medical Record Number: [id number] Patient Account Number: [id number] Date of Birth/Sex: Treating RN: [birth year] (41 y.o. Tonita Phoenix, [name] Primary Care Selden Noteboom: Juluis Mire Other Clinician: Referring Saretta Dahlem: Treating Haruki Arnold/Extender: Edmonia Lynch in Treatment: 34 Active Inactive Nutrition Nursing Diagnoses: Impaired glucose control: actual or potential Goals: Patient/caregiver verbalizes understanding of need to maintain therapeutic glucose control per primary care physician Date Initiated: 01/22/2021 Target Resolution Date: 01/12/2022 Goal Status: Active Interventions: Assess HgA1c results as ordered upon admission and as needed Provide education on elevated blood sugars and impact on wound healing Treatment Activities: Obtain HgA1c : 01/22/2021 Notes: 07/10/21: Glucose control ongoing 09/11/21 : Glucose control  ongoing, patient not compliant in checking glucose. Wound/Skin Impairment Nursing Diagnoses: Impaired tissue integrity Goals: Patient/caregiver will verbalize understanding of skin care regimen Date Initiated: 01/22/2021 Target Resolution Date: 01/12/2022 Goal Status: Active Ulcer/skin breakdown will have a volume reduction of 30% by week 4 Date Initiated: 01/22/2021 Date Inactivated: 03/19/2021 Target Resolution Date: 03/16/2021 Goal Status: Met Ulcer/skin breakdown will have a volume reduction of 50% by week 8 Date Initiated: 03/19/2021 Date Inactivated: 05/14/2021 Target Resolution Date: 04/16/2021 Unmet Reason: see wound Goal Status: Unmet measurements Interventions: Assess patient/caregiver ability to obtain necessary supplies Assess patient/caregiver ability to perform ulcer/skin care regimen upon admission and as needed Assess ulceration(s) every visit Provide education on ulcer and skin care Treatment Activities: Topical wound management initiated : 01/22/2021 Notes: 06/04/21: Wound vac started 07/10/21: Wound care regimen continues Electronic Signature(s) Signed: 12/27/2021 5:16:09 PM By: Rhae Hammock RN Entered By: Rhae Hammock on 12/21/2021 11:00:59 [name], [name] ([id number]) [id number] Page 5 of 7 -------------------------------------------------------------------------------- Pain Assessment Details Patient Name: Date of Service: [name] V IS, [name] [name]. 12/21/2021 10:30 A M Medical Record  Number: [id number] Patient Account Number: [id number] Date of Birth/Sex: Treating RN: 27-Sep-1980 (41 y.o. Tonita Phoenix, [name] Primary Care Keoni Risinger: Juluis Mire Other Clinician: Referring Muslima Toppins: Treating Cleotha Tsang/Extender: Edmonia Lynch in Treatment: 10 Active Problems Location of Pain Severity and Description of Pain Patient Has Paino No Site Locations Pain Management and Medication Current Pain Management: Electronic  Signature(s) Signed: 12/27/2021 5:16:09 PM By: Rhae Hammock RN Entered By: Rhae Hammock on 12/21/2021 11:00:29 -------------------------------------------------------------------------------- Patient/Caregiver Education Details Patient Name: Date of Service: [name] [name], [place] 12/8/2023andnbsp10:30 [place] Record Number: [id number] Patient Account Number: [id number] Date of Birth/Gender: Treating RN: 1980-09-12 (42 y.o. Tonita Phoenix, [name] Primary Care Physician: Juluis Mire Other Clinician: Referring Physician: Treating Physician/Extender: Edmonia Lynch in Treatment: 85 Education Assessment Education Provided To: Patient Education Topics Provided Elevated Blood Sugar/ Impact on Healing: Methods: Explain/Verbal [name], [name] ([id number]) [phone].pdf Page 6 of 7 Responses: Reinforcements needed, State content correctly Wound/Skin Impairment: Methods: Explain/Verbal Responses: Reinforcements needed, State content correctly Electronic Signature(s) Signed: 12/27/2021 5:16:09 PM By: Rhae Hammock RN Entered By: Rhae Hammock on 12/21/2021 11:01:19 -------------------------------------------------------------------------------- Wound Assessment Details Patient Name: Date of Service: [name] V IS, [name] [name]. 12/21/2021 10:30 A M Medical Record Number: [id number] Patient Account Number: [id number] Date of Birth/Sex: Treating RN: [birth year] (41 y.o. 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, Medial Amputation Site - Transmetatarsal Secondary Open Surgical Wound Etiology: Wounding Event: Surgical Injury Wound Status: Open Date Acquired:  07/10/2021 Comorbid History: Hypertension, Type II Diabetes, Osteomyelitis, Weeks Of Treatment: 23 Neuropathy Clustered Wound: No Wound Measurements Length: (cm) 0.3 Width: (cm) 0.7 Depth: (cm) 0.8 Area: (cm) 0.165 Volume: (cm) 0.132 % Reduction in Area: -39.8% % Reduction in Volume: -277.1% Epithelialization: None Tunneling: No Undermining: No Wound Description Classification: Grade 3 Wound Margin: Distinct, outline attached Exudate Amount: Medium Exudate Type: Serosanguineous Exudate Color: red, brown Foul Odor After Cleansing: No Slough/Fibrino Yes Wound Bed Granulation Amount: Large (67-100%) Exposed Structure Granulation Quality: Pink, Pale Fascia Exposed: No Necrotic Amount: Small (1-33%) Fat Layer (Subcutaneous Tissue) Exposed: Yes Tendon Exposed: No Muscle Exposed: No Joint Exposed: No Bone Exposed: No Periwound Skin Texture Texture Color No Abnormalities Noted: No No Abnormalities Noted: Yes Callus: Yes Temperature / Pain Crepitus: No Temperature: No Abnormality Excoriation: No Induration: No Rash: No Scarring: No Moisture No Abnormalities Noted: No Dry / Scaly: No Maceration: No [name], [name] ([id number]) [id number] Page 7 of 7 Electronic Signature(s) Signed: 12/27/2021 5:16:09 PM By: Rhae Hammock RN Entered By: Rhae Hammock on 12/21/2021 10:58:07 -------------------------------------------------------------------------------- Vitals Details Patient Name: Date of Service: [name] V IS, [name] [name]. 12/21/2021 10:30 A M Medical Record Number: [id number] Patient Account Number: [id number] Date of Birth/Sex: Treating RN: March 03, 1980 (41 y.o. Tonita Phoenix, [name] Primary Care Guynell Kleiber: Juluis Mire Other Clinician: Referring Jeyren Danowski: Treating Lessly Stigler/Extender: Edmonia Lynch in Treatment: 47 Vital Signs Time Taken: 11:00 Temperature (F): 98.7 Height (in): 69 Pulse (bpm):  74 Respiratory Rate (breaths/min): 17 Blood Pressure (mmHg): 108/74 Capillary Blood Glucose (mg/dl): 129 Reference Range: 80 - 120 mg / dl Electronic Signature(s) Signed: 12/27/2021 5:16:09 PM By: Rhae Hammock RN Entered By: Rhae Hammock on 12/21/2021 11:00:24

## 2021-12-27 NOTE — Progress Notes (Signed)
[name], [name] ([id number]) [id number] Page 1 of 9 Visit Report for 12/21/2021 Chief Complaint Document Details Patient Name: Date of Service: [name], [name]. 12/21/2021 10:30 A M Medical Record Number: [id number] Patient Account Number: [id number] Date of Birth/Sex: Treating RN: 09/03/1980 (41 y.o. F) Primary Care Provider: Gwinda Passe Other Clinician: Referring Provider: Treating Provider/Extender: Grace Isaac in Treatment: 80 Information Obtained from: Patient Chief Complaint Osteomyelitis of the right foot status post transmetatarsal amputation with surgical site dehiscence Electronic Signature(s) Signed: 12/21/2021 12:38:41 PM By: Geralyn Corwin DO Entered By: Geralyn Corwin on 12/21/2021 12:25:05 -------------------------------------------------------------------------------- HPI Details Patient Name: Date of Service: [name], [name] [name]. 12/21/2021 10:30 A M Medical Record Number: [id number] Patient Account Number: [id number] Date of Birth/Sex: Treating RN: January 27, 1980 (41 y.o. F) Primary Care Provider: Gwinda Passe Other Clinician: Referring Provider: Treating Provider/Extender: Grace Isaac in Treatment: 2 History of Present Illness HPI Description: Admission 01/22/2021 [name] a 41 year old female with a past medical history of insulin-dependent uncontrolled type 2 diabetes with last hemoglobin A1c of 13.5, osteomyelitis of the right foot status post transmetatarsal amputation on 12/18/2020 that presents to the clinic for right foot wound. She has had dehiscence of the surgical site. [name] using wet-to-dry dressings. She has a PICC line and receiving IV ceftriaxone daily for her osteomyelitis. There [name] an end date of 01/27/2021. [name] also taking oral metronidazole. She currently denies systemic signs of infection. 1/19; patient presents for follow-up. She  was diagnosed with a DVT to the right lower extremity 2 days ago. [name] on Eliquis now. [name] scheduled to see her infectious disease doctor tomorrow. She has been using Dakin's wet-to-dry dressings. She denies systemic signs of infection. 1/26; patient presents for follow-up. She saw infectious disease on 1/21 started on Augmentin. Her PICC line and IV ceftriaxone was discontinued. Patient reports stability to her wound. She has been using Dakin's wet-to-dry dressings. She currently denies systemic signs of infection. 2/3; patient presents for follow-up. She continues to use Dakin's wet-to-dry dressings to the wound bed. She saw Dr. Manson Passey with infectious disease yesterday and [name] continuing Augmentin. T entative end date [name] 2/16. Patient reports following up with orthopedics. She states there [name] no further plan from them. She currently denies systemic signs of infection. 2/10; patient presents for follow-up. She continues to use Dakin's wet to dry dressings. [name] scheduled to have her MRI done on 2/14. She states that she had pain to the debridement site from last clinic visit and declines debridement today. She denies systemic signs of infection. She continues to have yellow thick drainage. 2/20; patient presents for follow-up. She continues to use Dakin's wet-to-dry dressings. She obtained her MRI. The results showed an abscess and [name] scheduled to see her orthopedic surgeon on 2/23. She saw infectious disease 2/17 and her antibiotics were extended. She currently denies systemic signs of infection. 3/6; patient presents for follow-up. She had debridement and irrigation of her foot on 03/10/2021 due to abscess noted on MRI. She was started on IV ceftriaxone and oral Flagyl. She has no issues or complaints today. She has been using iodoform packing to the tunnel and Dakin's wet-to-dry to the opening. 03/26/2021: She continues on IV ceftriaxone and oral metronidazole. She has follow-up with  infectious disease tomorrow. No significant issues or complaints today. Her mother continues to help her with her wound dressing, using iodoform packing strips into the tunnel and  Dakin's to the open portion of the wound. 3/20; patient presents for follow-up. She continues to be on IV ceftriaxone in oral metronidazole. She has been using iodoform to the tunnel and Dakin's wet-toMCKENZI, BUONOMO ([id number]) [id number] Page 2 of 9 dry to the open wound. She denies signs of infection. 3/27; patient presents for follow-up. She no longer has a PICC line. She has been using iodoform to the tunnel and Dakin's wet-to-dry to the open wound. She reports improvement in wound healing. She denies signs of infection. 4/3; patient presents for follow-up. She states she has been using Hydrofera Blue to the open wound and iodoform packing to the tunnel without any issues. She denies signs of infection. 4/18; patient presents for follow-up. She saw infectious disease on 4/11. She has finished her oral antibiotics and completed a total of 6 weeks of antibiotics (this includes IV as well). No further antibiotics needed. She has been using Hydrofera Blue and iodoform packing. She states that the tunneled area has come in and the iodoform [name] not staying in place anymore. She has no issues or complaints today. She denies signs of infection. 4/24; patient presents for follow-up. She saw Dr. Carlene Coria, plastic surgery to discuss potential skin graft/substitute placement. At this time he thinks that the skin graft would likely not take. He [name] in agreement with trying a wound VAC. Patient has been using Hydrofera Blue dressing changes with no issues. She denies signs of infection. She reports improvement in wound healing. 5/1; patient presents for follow-up. Unfortunately patient did not have insurance when we ran [place] the [place]. There [name] an assistance program and we are trying to get this  accommodated for the patient. In the meantime she has been using Hydrofera Blue without any issues. She denies signs of infection. 5/8; patient presents for follow-up. We have not heard back if pico [name] covered by her insurance. She has been using collagen to the wound bed over the past week. She denies signs of infection. 5/18; patient presents for follow-up. She has been using collagen to the wound bed without issues. Again we have not heard if pico [name] covered by her insurance. She has no issues or complaints today. 5/23; patient presents for follow-up. She has been using collagen to the wound bed. She has no issues or complaints today. She obtained the wound VAC from [facility] and brought this in today. She denies signs of infection. 6/1; patient presents for follow-up. She has been using the wound VAC for the past week. She has had this changed twice since she was last here. She reports more maceration to the periwound. She denies signs of infection. 6/7; right TMA site. There are 2 wounds 0 separated by a bridge of healed tissue. The more lateral area has undermining. Both areas have healthy looking granulation at the base but relative the size of the wound [name] fairly deep. There [name] no exposed bone no evidence of infection. Her wound VAC was put on hold last week because of surrounding skin maceration she has been using collagen this week. She has a modified shoe 6/12; patient presents for follow-up. Last week the wound VAC was reinitiated. She had no issues with the wound VAC itself. Today she has maceration again noted to the surrounding skin. She denies signs of infection. 6/27; patient presents for follow-up. She has been using Medihoney to the wound bed. We took a break from the wound VAC because the periwound was macerated. She still has some areas of  maceration to the distal foot where there [name] a callus. She currently denies signs of infection. 7/11; patient presents for follow-up. She has been  using Medihoney to the wound bed. She has developed some increased warmth and erythema to the lateral aspect of the right foot. She states this [name] occurred over the past week and there [name] increased pain. No drainage noted. 7/17; patient presents for follow-up. She has been using Medihoney to the wound bed. She completed her course of Bactrim. She reports improvement in symptoms. 7/24; Patient presents for follow up. She has been using Medihoney to the wound bed without issues. She completed another course of Bactrim. She reports improvement in her symptoms but still has some mild tenderness to the medial aspect of the foot. 7/31; Patient presents for follow-up. She has been using Medihoney and Dakin's to the wound bed. She denies signs of infection. 8/14; patient presents for follow-up. She has been using Dakin's wet-to-dry packing strips to the right medial aspect of the amputation site and Medihoney to the anterior site. She has no issues or complaints today. She has started physical therapy. She denies signs of infection. 8/29; patient presents for follow-up. She has been using Dakin's wet-to-dry packing strips to the right medial aspect of the amputation site however this [name] becoming more difficult to place. She did report that she had increased redness and swelling to that site and developed some drainage. It has resolved. She continues with physical therapy. 9/8; patient presents for follow-up. We have been using silver alginate to the tunneled area. She completed her course of antibiotics. She reports no pain, increased swelling or erythema. 9/21; patient presents for follow-up. She has been using gentamicin to the tunneled area. She has no issues or complaints today. She denies signs of infection. 10/2; patient presents for follow-up. [name] scheduled to have her CT scan on 10/9. She currently denies signs of infection. She denies increased warmth, erythema or purulent drainage from the wound  bed. She has been using Dakin's wet-to-dry dressings. 10/12; patient presents for follow-up. She had her CT scan on 10/9 that showed A small irregular rim-enhancing fluid pocket communicating to the overlying soft tissues of the sinus tract compatible with a small abscess. Currently she denies systemic signs of infection. She has been doing Dakin's wet-to-dry packing strips but it [name] hard for her to pack into the narrow opening. 10/30; patient presents for follow-up. Since last clinic visit she has had OR debridement of her Right foot by Dr. Odis Hollingshead Due to abscess noted on CT. She has been using iodoform packing. 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 She will complete 2 weeks of this and continue with oral amoxicillin for the next 6 to 12 months. She follows with Dr. Luciana Axe for this. She currently denies signs of infection. 11/13; patient presents for follow-up. Patient has been using silver alginate with gentamicin to the wound bed. She has no issues or complaints today she. She reports improvement in wound healing. 12/4; patient presents for follow-up. She has been using silver alginate and gentamicin to the wound bed. She has no issues or complaints today. 12/8; patient presents for follow-up. She has been using silver alginate with gentamicin to the wound bed. She presents for her first cast placement. She will be back early next week for her obligatory cast change. Electronic Signature(s) Signed: 12/21/2021 12:38:41 PM By: Geralyn Corwin DO Entered By: Geralyn Corwin on 12/21/2021 12:26:36 [name], [name] ([id number])  [phone].pdf Page 3 of 9 -------------------------------------------------------------------------------- Physical Exam Details Patient Name: Date of Service: [name], [name] [name]. 12/21/2021 10:30 A M Medical Record Number: [id number] Patient Account Number: [id number] Date of Birth/Sex: Treating  RN: June 15, 1980 (41 y.o. F) Primary Care Provider: Gwinda Passe Other Clinician: Referring Provider: Treating Provider/Extender: Grace Isaac in Treatment: 88 Constitutional respirations regular, non-labored and within target range for patient.. Cardiovascular 2+ dorsalis pedis/posterior tibialis pulses. Psychiatric pleasant and cooperative. Notes Right foot: T the transmetatarsal amputation site there [name] an open wound with granulation tissue with a tunnel to the medial aspect. No increased warmth, o erythema or purulent drainage noted. Electronic Signature(s) Signed: 12/21/2021 12:38:41 PM By: Geralyn Corwin DO Entered By: Geralyn Corwin on 12/21/2021 12:28:02 -------------------------------------------------------------------------------- Physician Orders Details Patient Name: Date of Service: [name], [name] [name]. 12/21/2021 10:30 A M Medical Record Number: [id number] Patient Account Number: [id number] Date of Birth/Sex: Treating RN: [birth year] (41 y.o. [name], [name] Primary Care Provider: Gwinda Passe Other Clinician: Referring Provider: Treating Provider/Extender: Grace Isaac in Treatment: 63 Verbal / Phone Orders: No Diagnosis Coding Follow-up Appointments ppointment in 1 week. - Monday 12/24/21 ***extra time due to TCC*** Return A ppointment in 2 weeks. - w/ Dr. Mikey Bussing on Monday ***Extra time TCC*** Return A Anesthetic (In clinic) Topical Lidocaine 5% applied to wound bed (In clinic) Topical Lidocaine 4% applied to wound bed Bathing/ Shower/ Hygiene Other Bathing/Shower/Hygiene Orders/Instructions: - Clean with Saline or Dakins Edema Control - Lymphedema / SCD / Other Elevate legs to the level of the heart or above for 30 minutes daily and/or when sitting, a frequency of: - throughout the day void standing for long periods of time. - Limit time/pressure on feet, reduce PT Use wheelchair instead  of walker when you can. . A Moisturize legs daily. Off-Loading Total Contact Cast to Right Lower Extremity Open toe surgical shoe to: - surgical shoe: reduce pressure when walking Additional Orders / Instructions Follow Nutritious Diet - -Monitor/Control Blood Sugar -High Protein Diet Tribbey, Manvir [name] ([id number]) [id number] Page 4 of 9 Wound Treatment Wound #2 - Amputation Site - Transmetatarsal Wound Laterality: Right, Medial Cleanser: Normal Saline (Generic) 1 x Per Day/30 Days Discharge Instructions: Cleanse the wound with Normal Saline prior to applying a clean dressing using gauze sponges, not tissue or cotton balls. Topical: Gentamicin 1 x Per Day/30 Days Discharge Instructions: As directed by physician Topical: Mupirocin Ointment 1 x Per Day/30 Days Discharge Instructions: Apply Mupirocin (Bactroban) as instructed Prim Dressing: Promogran Prisma Matrix, 4.34 (sq in) (silver collagen) 1 x Per Day/30 Days ary Discharge Instructions: Moisten collagen with saline or hydrogel Secondary Dressing: Woven Gauze Sponge, Non-Sterile 4x4 in (Generic) 1 x Per Day/30 Days Discharge Instructions: Apply over primary dressing as directed. Secured With: [employer], 4.5x3.1 (in/yd) (Generic) 1 x Per Day/30 Days Discharge Instructions: Secure with Kerlix as directed. Electronic Signature(s) Signed: 12/21/2021 12:38:41 PM By: Geralyn Corwin DO Entered By: Geralyn Corwin on 12/21/2021 12:28:09 -------------------------------------------------------------------------------- Problem List Details Patient Name: Date of Service: [name], [name] [name]. 12/21/2021 10:30 A M Medical Record Number: [id number] Patient Account Number: [id number] Date of Birth/Sex: Treating RN: 1980-10-19 (41 y.o. F) Primary Care Provider: Gwinda Passe Other Clinician: Referring Provider: Treating Provider/Extender: Grace Isaac in Treatment: 47 Active  Problems ICD-10 Encounter Code Description Active Date MDM Diagnosis L97.514 Non-pressure chronic ulcer of other part of right foot with necrosis of bone 01/22/2021 No Yes E11.621 Type 2  diabetes mellitus with foot ulcer 01/22/2021 No Yes M86.9 Osteomyelitis, unspecified 01/22/2021 No Yes Inactive Problems Resolved Problems Electronic Signature(s) Signed: 12/21/2021 12:38:41 PM By: Geralyn Corwin DO Entered By: Geralyn Corwin on 12/21/2021 12:23:51 Albrecht, [name] ([id number]) [id number] Page 5 of 9 -------------------------------------------------------------------------------- Progress Note Details Patient Name: Date of Service: [name], [name] [name]. 12/21/2021 10:30 A M Medical Record Number: [id number] Patient Account Number: [id number] Date of Birth/Sex: Treating RN: Dec 10, 1980 (41 y.o. F) Primary Care Provider: Gwinda Passe Other Clinician: Referring Provider: Treating Provider/Extender: Grace Isaac in Treatment: 47 Subjective Chief Complaint Information obtained from Patient Osteomyelitis of the right foot status post transmetatarsal amputation with surgical site dehiscence History of Present Illness (HPI) Admission 01/22/2021 Ms. Lennix Rotundo [name] a 41 year old female with a past medical history of insulin-dependent uncontrolled type 2 diabetes with last hemoglobin A1c of 13.5, osteomyelitis of the right foot status post transmetatarsal amputation on 12/18/2020 that presents to the clinic for right foot wound. She has had dehiscence of the surgical site. [name] using wet-to-dry dressings. She has a PICC line and receiving IV ceftriaxone daily for her osteomyelitis. There [name] an end date of 01/27/2021. [name] also taking oral metronidazole. She currently denies systemic signs of infection. 1/19; patient presents for follow-up. She was diagnosed with a DVT to the right lower extremity 2 days ago. [name] on Eliquis now.  [name] scheduled to see her infectious disease doctor tomorrow. She has been using Dakin's wet-to-dry dressings. She denies systemic signs of infection. 1/26; patient presents for follow-up. She saw infectious disease on 1/21 started on Augmentin. Her PICC line and IV ceftriaxone was discontinued. Patient reports stability to her wound. She has been using Dakin's wet-to-dry dressings. She currently denies systemic signs of infection. 2/3; patient presents for follow-up. She continues to use Dakin's wet-to-dry dressings to the wound bed. She saw Dr. Manson Passey with infectious disease yesterday and [name] continuing Augmentin. T entative end date [name] 2/16. Patient reports following up with orthopedics. She states there [name] no further plan from them. She currently denies systemic signs of infection. 2/10; patient presents for follow-up. She continues to use Dakin's wet to dry dressings. [name] scheduled to have her MRI done on 2/14. She states that she had pain to the debridement site from last clinic visit and declines debridement today. She denies systemic signs of infection. She continues to have yellow thick drainage. 2/20; patient presents for follow-up. She continues to use Dakin's wet-to-dry dressings. She obtained her MRI. The results showed an abscess and [name] scheduled to see her orthopedic surgeon on 2/23. She saw infectious disease 2/17 and her antibiotics were extended. She currently denies systemic signs of infection. 3/6; patient presents for follow-up. She had debridement and irrigation of her foot on 03/10/2021 due to abscess noted on MRI. She was started on IV ceftriaxone and oral Flagyl. She has no issues or complaints today. She has been using iodoform packing to the tunnel and Dakin's wet-to-dry to the opening. 03/26/2021: She continues on IV ceftriaxone and oral metronidazole. She has follow-up with infectious disease tomorrow. No significant issues or complaints today. Her mother  continues to help her with her wound dressing, using iodoform packing strips into the tunnel and Dakin's to the open portion of the wound. 3/20; patient presents for follow-up. She continues to be on IV ceftriaxone in oral metronidazole. She has been using iodoform to the tunnel and Dakin's wet-to- dry to the open wound. She  denies signs of infection. 3/27; patient presents for follow-up. She no longer has a PICC line. She has been using iodoform to the tunnel and Dakin's wet-to-dry to the open wound. She reports improvement in wound healing. She denies signs of infection. 4/3; patient presents for follow-up. She states she has been using Hydrofera Blue to the open wound and iodoform packing to the tunnel without any issues. She denies signs of infection. 4/18; patient presents for follow-up. She saw infectious disease on 4/11. She has finished her oral antibiotics and completed a total of 6 weeks of antibiotics (this includes IV as well). No further antibiotics needed. She has been using Hydrofera Blue and iodoform packing. She states that the tunneled area has come in and the iodoform [name] not staying in place anymore. She has no issues or complaints today. She denies signs of infection. 4/24; patient presents for follow-up. She saw Dr. Carlene Coria, plastic surgery to discuss potential skin graft/substitute placement. At this time he thinks that the skin graft would likely not take. He [name] in agreement with trying a wound VAC. Patient has been using Hydrofera Blue dressing changes with no issues. She denies signs of infection. She reports improvement in wound healing. 5/1; patient presents for follow-up. Unfortunately patient did not have insurance when we ran [place] the [place]. There [name] an assistance program and we are trying to get this accommodated for the patient. In the meantime she has been using Hydrofera Blue without any issues. She denies signs of infection. 5/8; patient presents for follow-up. We have  not heard back if pico [name] covered by her insurance. She has been using collagen to the wound bed over the past week. She denies signs of infection. 5/18; patient presents for follow-up. She has been using collagen to the wound bed without issues. Again we have not heard if pico [name] covered by her insurance. She has no issues or complaints today. 5/23; patient presents for follow-up. She has been using collagen to the wound bed. She has no issues or complaints today. She obtained the wound VAC from [facility] and brought this in today. She denies signs of infection. 6/1; patient presents for follow-up. She has been using the wound VAC for the past week. She has had this changed twice since she was last here. She reports more maceration to the periwound. She denies signs of infection. 6/7; right TMA site. There are 2 wounds 0 separated by a bridge of healed tissue. The more lateral area has undermining. Both areas have healthy looking granulation at the base but relative the size of the wound [name] fairly deep. There [name] no exposed bone no evidence of infection. [name], [name] ([id number]) [id number] Page 6 of 9 Her wound VAC was put on hold last week because of surrounding skin maceration she has been using collagen this week. She has a modified shoe 6/12; patient presents for follow-up. Last week the wound VAC was reinitiated. She had no issues with the wound VAC itself. Today she has maceration again noted to the surrounding skin. She denies signs of infection. 6/27; patient presents for follow-up. She has been using Medihoney to the wound bed. We took a break from the wound VAC because the periwound was macerated. She still has some areas of maceration to the distal foot where there [name] a callus. She currently denies signs of infection. 7/11; patient presents for follow-up. She has been using Medihoney to the wound bed. She has developed some increased warmth and erythema to the  lateral aspect of the right foot. She states this [name] occurred over the past week and there [name] increased pain. No drainage noted. 7/17; patient presents for follow-up. She has been using Medihoney to the wound bed. She completed her course of Bactrim. She reports improvement in symptoms. 7/24; Patient presents for follow up. She has been using Medihoney to the wound bed without issues. She completed another course of Bactrim. She reports improvement in her symptoms but still has some mild tenderness to the medial aspect of the foot. 7/31; Patient presents for follow-up. She has been using Medihoney and Dakin's to the wound bed. She denies signs of infection. 8/14; patient presents for follow-up. She has been using Dakin's wet-to-dry packing strips to the right medial aspect of the amputation site and Medihoney to the anterior site. She has no issues or complaints today. She has started physical therapy. She denies signs of infection. 8/29; patient presents for follow-up. She has been using Dakin's wet-to-dry packing strips to the right medial aspect of the amputation site however this [name] becoming more difficult to place. She did report that she had increased redness and swelling to that site and developed some drainage. It has resolved. She continues with physical therapy. 9/8; patient presents for follow-up. We have been using silver alginate to the tunneled area. She completed her course of antibiotics. She reports no pain, increased swelling or erythema. 9/21; patient presents for follow-up. She has been using gentamicin to the tunneled area. She has no issues or complaints today. She denies signs of infection. 10/2; patient presents for follow-up. [name] scheduled to have her CT scan on 10/9. She currently denies signs of infection. She denies increased warmth, erythema or purulent drainage from the wound bed. She has been using Dakin's wet-to-dry dressings. 10/12; patient presents for follow-up.  She had her CT scan on 10/9 that showed A small irregular rim-enhancing fluid pocket communicating to the overlying soft tissues of the sinus tract compatible with a small abscess. Currently she denies systemic signs of infection. She has been doing Dakin's wet-to-dry packing strips but it [name] hard for her to pack into the narrow opening. 10/30; patient presents for follow-up. Since last clinic visit she has had OR debridement of her Right foot by Dr. Odis Hollingshead Due to abscess noted on CT. She has been using iodoform packing. 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 She will complete 2 weeks of this and continue with oral amoxicillin for the next 6 to 12 months. She follows with Dr. Luciana Axe for this. She currently denies signs of infection. 11/13; patient presents for follow-up. Patient has been using silver alginate with gentamicin to the wound bed. She has no issues or complaints today she. She reports improvement in wound healing. 12/4; patient presents for follow-up. She has been using silver alginate and gentamicin to the wound bed. She has no issues or complaints today. 12/8; patient presents for follow-up. She has been using silver alginate with gentamicin to the wound bed. She presents for her first cast placement. She will be back early next week for her obligatory cast change. Patient History Information obtained from Patient. Family History Cancer - Paternal Grandparents, Diabetes - Mother, Hypertension - Mother, Stroke - Maternal Grandparents, No family history of Heart Disease, Hereditary Spherocytosis, Kidney Disease, Lung Disease, Seizures, Thyroid Problems, Tuberculosis. Social History Never smoker, Marital Status - Single, Alcohol Use - Rarely, Drug Use - Prior History - Marijuana, Caffeine Use - Daily. Medical  History Cardiovascular Patient has history of Hypertension Endocrine Patient has history of Type II  Diabetes Musculoskeletal Patient has history of Osteomyelitis - Right Transmet 12/18/20 Neurologic Patient has history of Neuropathy Hospitalization/Surgery History - inpatient right foot abscess 10/12-10/18/2023. Objective Constitutional respirations regular, non-labored and within target range for patient.. Vitals Time Taken: 11:00 AM, Height: 69 in, Temperature: 98.7 F, Pulse: 74 bpm, Respiratory Rate: 17 breaths/min, Blood Pressure: 108/74 mmHg, Capillary Blood Glucose: 129 mg/dl. [name], [name] ([id number]) [id number] Page 7 of 9 Cardiovascular 2+ dorsalis pedis/posterior tibialis pulses. Psychiatric pleasant and cooperative. General Notes: Right foot: T the transmetatarsal amputation site there [name] an open wound with granulation tissue with a tunnel to the medial aspect. No o increased warmth, erythema or purulent drainage noted. Integumentary (Hair, Skin) Wound #2 status [name] Open. Original cause of wound was Surgical Injury. The date acquired was: 07/10/2021. The wound has been in treatment 23 weeks. The wound [name] located on the Right,Medial Amputation Site - Transmetatarsal. The wound measures 0.3cm length x 0.7cm width x 0.8cm depth; 0.165cm^2 area and 0.132cm^3 volume. There [name] Fat Layer (Subcutaneous Tissue) exposed. There [name] no tunneling or undermining noted. There [name] a medium amount of serosanguineous drainage noted. The wound margin [name] distinct with the outline attached to the wound base. There [name] large (67-100%) pink, pale granulation within the wound bed. There [name] a small (1-33%) amount of necrotic tissue within the wound bed. The periwound skin appearance had no abnormalities noted for color. The periwound skin appearance exhibited: Callus. The periwound skin appearance did not exhibit: Crepitus, Excoriation, Induration, Rash, Scarring, Dry/Scaly, Maceration. Periwound temperature was noted as No Abnormality. 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 I recommended collagen with antibiotic ointment under the total contact cast. She knows not to get this wet. She knows to keep the black boot on. Follow-up early next week. Procedures Wound #2 Pre-procedure diagnosis of Wound #2 [name] a Diabetic Wound/Ulcer of the Lower Extremity located on the Right,Medial Amputation Site - Transmetatarsal . There was a T Research scientist (life sciences)Contact Cast Procedure by Geralyn CorwinHoffman, Indya Oliveria, DO. otal Post procedure Diagnosis Wound #2: Same as Pre-Procedure Plan Follow-up Appointments: Return Appointment in 1 week. - Monday 12/24/21 ***extra time due to TCC*** Return Appointment in 2 weeks. - w/ Dr. Mikey BussingHOffman on Monday ***Extra time TCC*** Anesthetic: (In clinic) Topical Lidocaine 5% applied to wound bed (In clinic) Topical Lidocaine 4% applied to wound bed Bathing/ Shower/ Hygiene: Other Bathing/Shower/Hygiene Orders/Instructions: - Clean with Saline or Dakins Edema Control - Lymphedema / SCD / Other: Elevate legs to the level of the heart or above for 30 minutes daily and/or when sitting, a frequency of: - throughout the day Avoid standing for long periods of time. - Limit time/pressure on feet, reduce PT Use wheelchair instead of walker when you can. . Moisturize legs daily. Off-Loading: T Contact Cast to Right Lower Extremity otal Open toe surgical shoe to: - surgical shoe: reduce pressure when walking Additional Orders / Instructions: Follow Nutritious Diet - -Monitor/Control Blood Sugar -High Protein Diet WOUND #2: - Amputation Site - Transmetatarsal Wound Laterality: Right, Medial Cleanser: Normal Saline (Generic) 1 x Per Day/30 Days Discharge Instructions: Cleanse the wound with Normal Saline prior to applying a clean dressing using gauze sponges, not tissue or cotton balls. Topical:  Gentamicin 1 x Per Day/30 Days Discharge Instructions: As directed by physician Topical: Mupirocin  Ointment 1 x Per Day/30 Days Discharge Instructions: Apply Mupirocin (Bactroban) as instructed Prim Dressing: Promogran Prisma Matrix, 4.34 (sq in) (silver collagen) 1 x Per Day/30 Days ary Discharge Instructions: Moisten collagen with saline or hydrogel Secondary Dressing: Woven Gauze Sponge, Non-Sterile 4x4 in (Generic) 1 x Per Day/30 Days Discharge Instructions: Apply over primary dressing as directed. Secured With: [employer], 4.5x3.1 (in/yd) (Generic) 1 x Per Day/30 Days Discharge Instructions: Secure with Kerlix as directed. [name], [name] ([id number]) [id number] Page 8 of 9 1. Collagen with antibiotic ointment 2. T contact cast placed in standard fashion otal Electronic Signature(s) Signed: 12/21/2021 12:38:41 PM By: Geralyn Corwin DO Entered By: Geralyn Corwin on 12/21/2021 12:29:42 -------------------------------------------------------------------------------- HxROS Details Patient Name: Date of Service: [name], [name] [name]. 12/21/2021 10:30 A M Medical Record Number: [id number] Patient Account Number: [id number] Date of Birth/Sex: Treating RN: Aug 04, 1980 (41 y.o. F) Primary Care Provider: Gwinda Passe Other Clinician: Referring Provider: Treating Provider/Extender: Grace Isaac in Treatment: 72 Information Obtained From Patient Cardiovascular Medical History: Positive for: Hypertension Endocrine Medical History: Positive for: Type II Diabetes Time with diabetes: Dx 2009 Treated with: Insulin, Oral agents Blood sugar tested every day: Yes Tested : daily Musculoskeletal Medical History: Positive for: Osteomyelitis - Right Transmet 12/18/20 Neurologic Medical History: Positive for: Neuropathy Immunizations Pneumococcal Vaccine: Received Pneumococcal Vaccination: No Implantable  Devices Yes Hospitalization / Surgery History Type of Hospitalization/Surgery inpatient right foot abscess 10/12-10/18/2023 Family and Social History Cancer: Yes - Paternal Grandparents; Diabetes: Yes - Mother; Heart Disease: No; Hereditary Spherocytosis: No; Hypertension: Yes - Mother; Kidney Disease: No; Lung Disease: No; Seizures: No; Stroke: Yes - Maternal Grandparents; Thyroid Problems: No; Tuberculosis: No; Never smoker; Marital Status - Single; Alcohol Use: Rarely; Drug Use: Prior History - Marijuana; Caffeine Use: Daily; Financial Concerns: No; Food, Clothing or Shelter Needs: No; Support System Lacking: No; Transportation Concerns: No Electronic Signature(s) Signed: 12/21/2021 12:38:41 PM By: Geralyn Corwin DO Entered By: Geralyn Corwin on 12/21/2021 12:26:44 [name], [name] ([id number]) [id number] Page 9 of 9 -------------------------------------------------------------------------------- Total Contact Cast Details Patient Name: Date of Service: [name] [name], [name] [name]. 12/21/2021 10:30 A M Medical Record Number: [id number] Patient Account Number: [id number] Date of Birth/Sex: Treating RN: 05-15-1980 (41 y.o. [name], [name] Primary Care Provider: Gwinda Passe Other Clinician: Referring Provider: Treating Provider/Extender: Grace Isaac in Treatment: [phone] T Contact Cast Applied for Wound Assessment: otal Wound #2 Right,Medial Amputation Site - Transmetatarsal Performed By: Physician Geralyn Corwin, DO Post Procedure Diagnosis Same as Pre-procedure Electronic Signature(s) Signed: 12/21/2021 12:38:41 PM By: Geralyn Corwin DO Signed: 12/27/2021 5:16:09 PM By: Fonnie Mu RN Entered By: Fonnie Mu on 12/21/2021 11:02:47 -------------------------------------------------------------------------------- SuperBill Details Patient Name: Date of Service: [name], [name] [name]. 12/21/2021 Medical Record  Number: [id number] Patient Account Number: [id number] Date of Birth/Sex: Treating RN: [birth year] (41 y.o. [name], [name] Primary Care Provider: Gwinda Passe Other Clinician: Referring Provider: Treating Provider/Extender: Grace Isaac in Treatment: 47 Diagnosis Coding ICD-10 Codes Code Description [phone] Non-pressure chronic ulcer of other part of right foot with necrosis of bone E11.621 Type 2 diabetes mellitus with foot ulcer M86.9 Osteomyelitis, unspecified Facility Procedures : CPT4 Code: [id number] Description: [phone] - APPLY TOTAL CONTACT LEG CAST ICD-10 Diagnosis Description L97.514 Non-pressure chronic ulcer of other part of right foot with necrosis of bone Modifier: Quantity: 1 Physician Procedures : CPT4 Code Description Modifier [phone] [id number] - WC PHYS APPLY TOTAL CONTACT CAST ICD-10 Diagnosis Description L97.514 Non-pressure  chronic ulcer of other part of right foot with necrosis of bone Quantity: 1 Electronic Signature(s) Signed: 12/21/2021 12:38:41 PM By: Geralyn Corwin DO Entered By: Geralyn Corwin on 12/21/2021 12:29:50

## 2021-12-31 ENCOUNTER — Other Ambulatory Visit: Payer: Self-pay

## 2021-12-31 ENCOUNTER — Encounter (HOSPITAL_BASED_OUTPATIENT_CLINIC_OR_DEPARTMENT_OTHER): Payer: Medicaid Other | Admitting: Internal Medicine

## 2021-12-31 DIAGNOSIS — L97514 Non-pressure chronic ulcer of other part of right foot with necrosis of bone: Secondary | ICD-10-CM

## 2021-12-31 DIAGNOSIS — E11621 Type 2 diabetes mellitus with foot ulcer: Secondary | ICD-10-CM

## 2022-01-03 NOTE — Progress Notes (Signed)
[name] ([id number]) [id number] Page 1 of 8 Visit Report for 12/31/2021 Arrival Information Details Patient Name: Date of Service: [name] IS, [name] [name] 12/31/2021 12:30 PM Medical Record Number: [id number] Patient Account Number: [id number] Date of Birth/Sex: Treating RN: [birth year] (41 y.o. [name] Primary Care Damarkus Balis: Juluis Mire Other Clinician: Referring Terald Jump: Treating Cozetta Seif/Extender: Edmonia Lynch in Treatment: 24 Visit Information History Since Last Visit Added or deleted any medications: No Patient Arrived: Wheel Chair Any new allergies or adverse reactions: No Arrival Time: 12:54 Had a fall or experienced change in No Accompanied By: Niece activities of daily living that may affect Transfer Assistance: None risk of falls: Patient Requires Transmission-Based No Signs or symptoms of abuse/neglect since last visito No Precautions: Hospitalized since last visit: No Patient Has Alerts: Yes Implantable device outside of the clinic excluding No Patient Alerts: Patient on Blood Thinner cellular tissue based products placed in the center PICC R Arm since last visit: ABI 12/17/20 R=1.08 Has Dressing in Place as Prescribed: Yes L=1.13 Pain Present Now: No Electronic Signature(s) Signed: 12/31/2021 3:55:49 PM By: Sharyn Creamer RN, BSN Entered By: Sharyn Creamer on 12/31/2021 12:55:09 -------------------------------------------------------------------------------- Encounter Discharge Information Details Patient Name: Date of Service: [name] [name] 12/31/2021 12:30 PM Medical Record Number: [id number] Patient Account Number: [id number] Date of Birth/Sex: Treating RN: 1980/04/21 (41 y.o. Tonita Phoenix, [name] Primary Care Livingston Denner: Juluis Mire Other Clinician: Referring Liylah Najarro: Treating Cadyn Rodger/Extender: Edmonia Lynch in Treatment: 16 Encounter  Discharge Information Items Post Procedure Vitals Discharge Condition: Stable Temperature (F): 98.7 Ambulatory Status: Wheelchair Pulse (bpm): 74 Discharge Destination: Home Respiratory Rate (breaths/min): 17 Transportation: Private Auto Blood Pressure (mmHg): 138/84 Accompanied By: niece Schedule Follow-up Appointment: Yes Clinical Summary of Care: Patient Declined Electronic Signature(s) Signed: 01/02/2022 5:39:23 PM By: Rhae Hammock RN Entered By: Rhae Hammock on 12/31/2021 13:22:15 [name], [name] L ([id number]) [id number] Page 2 of 8 -------------------------------------------------------------------------------- Lower Extremity Assessment Details Patient Name: Date of Service: [name] IS, [name] [name] 12/31/2021 12:30 PM Medical Record Number: [id number] Patient Account Number: [id number] Date of Birth/Sex: Treating RN: [birth year] (41 y.o. [name] Primary Care Jacynda Brunke: Juluis Mire Other Clinician: Referring Bernardino Dowell: Treating Jonavan Vanhorn/Extender: Edmonia Lynch in Treatment: 49 Edema Assessment Assessed: [Left: No] [Right: No] Edema: [Left: Ye] [Right: s] Calf Left: Right: Point of Measurement: 34 cm From Medial Instep 46.8 cm Ankle Left: Right: Point of Measurement: 8 cm From Medial Instep 26.5 cm Vascular Assessment Pulses: Dorsalis Pedis Palpable: [Right:Yes] Electronic Signature(s) Signed: 12/31/2021 3:55:49 PM By: Sharyn Creamer RN, BSN Entered By: Sharyn Creamer on 12/31/2021 13:07:38 -------------------------------------------------------------------------------- Multi Wound Chart Details Patient Name: Date of Service: [name] [name] Hammans, [name] [name] 12/31/2021 12:30 PM Medical Record Number: [id number] Patient Account Number: [id number] Date of Birth/Sex: Treating RN: 01/20/1980 (41 y.o. F) Primary Care Dezarae Mcclaran: Juluis Mire Other Clinician: Referring Jesslyn Viglione: Treating Zaedyn Covin/Extender:  Edmonia Lynch in Treatment: 49 Vital Signs Height(in): 38 Pulse(bpm): 50 Weight(lbs): Blood Pressure(mmHg): 110/77 Body Mass Index(BMI): Temperature(F): 98.6 Respiratory Rate(breaths/min): 18 [2:Photos:] [N/A:N/A] Right, Medial Amputation Site - N/A N/A Wound Location: Transmetatarsal Surgical Injury N/A N/A Wounding Event: Diabetic Wound/Ulcer of the Lower N/A N/A Primary Etiology: Extremity Open Surgical Wound N/A N/A Secondary Etiology: Hypertension, Type II Diabetes, N/A N/A Comorbid History: Osteomyelitis, Neuropathy 07/10/2021 N/A N/A Date Acquired: 24 N/A N/A Weeks of Treatment: Open N/A N/A Wound Status: No N/A N/A Wound Recurrence: 0.3x0.3x0.7 N/A N/A Measurements L x W x  D (cm) 0.071 N/A N/A A (cm) : rea 0.049 N/A N/A Volume (cm) : 39.80% N/A N/A % Reduction in A rea: -40.00% N/A N/A % Reduction in Volume: Grade 3 N/A N/A Classification: Medium N/A N/A Exudate A mount: Serosanguineous N/A N/A Exudate Type: red, brown N/A N/A Exudate Color: Distinct, outline attached N/A N/A Wound Margin: Large (67-100%) N/A N/A Granulation A mount: Pink, Pale N/A N/A Granulation Quality: Small (1-33%) N/A N/A Necrotic A mount: Fat Layer (Subcutaneous Tissue): Yes N/A N/A Exposed Structures: Fascia: No Tendon: No Muscle: No Joint: No Bone: No None N/A N/A Epithelialization: Debridement - Selective/Open Wound N/A N/A Debridement: Pre-procedure Verification/Time Out 13:15 N/A N/A Taken: Lidocaine N/A N/A Pain Control: Callus N/A N/A Tissue Debrided: Non-Viable Tissue N/A N/A Level: 0.09 N/A N/A Debridement A (sq cm): rea Curette N/A N/A Instrument: Minimum N/A N/A Bleeding: Pressure N/A N/A Hemostasis A chieved: 0 N/A N/A Procedural Pain: 0 N/A N/A Post Procedural Pain: Procedure was tolerated well N/A N/A Debridement Treatment Response: 0.3x0.3x0.7 N/A N/A Post Debridement Measurements L x W x D  (cm) 0.049 N/A N/A Post Debridement Volume: (cm) Callus: Yes N/A N/A Periwound Skin Texture: Excoriation: No Induration: No Crepitus: No Rash: No Scarring: No Maceration: No N/A N/A Periwound Skin Moisture: Dry/Scaly: No Atrophie Blanche: No N/A N/A Periwound Skin Color: Cyanosis: No Ecchymosis: No Erythema: No Hemosiderin Staining: No Mottled: No Pallor: No Rubor: No No Abnormality N/A N/A Temperature: Debridement N/A N/A Procedures Performed: T Contact Cast otal Treatment Notes Wound #2 (Amputation Site - Transmetatarsal) Wound Laterality: Right, Medial Cleanser Normal Saline Discharge Instruction: Cleanse the wound with Normal Saline prior to applying a clean dressing using gauze sponges, not tissue or cotton balls. Peri-Wound Care Topical Gentamicin Discharge Instruction: As directed by physician Mupirocin Ointment Discharge Instruction: Apply Mupirocin (Bactroban) as instructed Benett, Marquise L ([id number]) [id number] Page 4 of 8 Primary Dressing Promogran Prisma Matrix, 4.34 (sq in) (silver collagen) Discharge Instruction: Moisten collagen with saline or hydrogel Secondary Dressing Woven Gauze Sponge, Non-Sterile 4x4 in Discharge Instruction: Apply over primary dressing as directed. Secured With [employer], 4.5x3.1 (in/yd) Discharge Instruction: Secure with Kerlix as directed. Compression Wrap Compression Stockings Add-Ons Electronic Signature(s) Signed: 12/31/2021 3:43:30 PM By: Kalman Shan DO Entered By: Kalman Shan on 12/31/2021 13:27:14 -------------------------------------------------------------------------------- Multi-Disciplinary Care Plan Details Patient Name: Date of Service: [name] [name] IS, [name] [name] 12/31/2021 12:30 PM Medical Record Number: [id number] Patient Account Number: [id number] Date of Birth/Sex: Treating RN: 09-18-1980 (41 y.o. Tonita Phoenix, [name] Primary Care Kitt Minardi: Juluis Mire  Other Clinician: Referring Kayzlee Wirtanen: Treating Terri Rorrer/Extender: Edmonia Lynch in Treatment: 40 Active Inactive Nutrition Nursing Diagnoses: Impaired glucose control: actual or potential Goals: Patient/caregiver verbalizes understanding of need to maintain therapeutic glucose control per primary care physician Date Initiated: 01/22/2021 Target Resolution Date: 01/12/2022 Goal Status: Active Interventions: Assess HgA1c results as ordered upon admission and as needed Provide education on elevated blood sugars and impact on wound healing Treatment Activities: Obtain HgA1c : 01/22/2021 Notes: 07/10/21: Glucose control ongoing 09/11/21 : Glucose control ongoing, patient not compliant in checking glucose. Wound/Skin Impairment Nursing Diagnoses: Impaired tissue integrity Goals: Patient/caregiver will verbalize understanding of skin care regimen Date Initiated: 01/22/2021 Target Resolution Date: 01/12/2022 Goal Status: Active Ulcer/skin breakdown will have a volume reduction of 30% by week 4 Date Initiated: 01/22/2021 Date Inactivated: 03/19/2021 Target Resolution Date: 03/16/2021 Goal Status: Met TULIP, MEHARG ([id number]) [id number] Page 5 of 8 Ulcer/skin breakdown will have a volume reduction of 50% by week  8 Date Initiated: 03/19/2021 Date Inactivated: 05/14/2021 Target Resolution Date: 04/16/2021 Unmet Reason: see wound Goal Status: Unmet measurements Interventions: Assess patient/caregiver ability to obtain necessary supplies Assess patient/caregiver ability to perform ulcer/skin care regimen upon admission and as needed Assess ulceration(s) every visit Provide education on ulcer and skin care Treatment Activities: Topical wound management initiated : 01/22/2021 Notes: 06/04/21: Wound vac started 07/10/21: Wound care regimen continues Electronic Signature(s) Signed: 01/02/2022 5:39:23 PM By: Rhae Hammock RN Entered By: Rhae Hammock on 12/31/2021 13:18:41 -------------------------------------------------------------------------------- Pain Assessment Details Patient Name: Date of Service: [name] [name] Hammans, [name] [name] 12/31/2021 12:30 PM Medical Record Number: [id number] Patient Account Number: [id number] Date of Birth/Sex: Treating RN: 1980/05/16 (41 y.o. [name] Primary Care Gena Laski: Juluis Mire Other Clinician: Referring Lomax Poehler: Treating Zadkiel Dragan/Extender: Edmonia Lynch in Treatment: 38 Active Problems Location of Pain Severity and Description of Pain Patient Has Paino No Site Locations Pain Management and Medication Current Pain Management: Electronic Signature(s) Signed: 12/31/2021 3:55:49 PM By: Sharyn Creamer RN, BSN Entered By: Sharyn Creamer on 12/31/2021 12:55:48 Pilz, Shanda Bumps ([id number]) [id number] Page 6 of 8 -------------------------------------------------------------------------------- Patient/Caregiver Education Details Patient Name: Date of Service: [name] IS, [name] [name] 12/18/2023andnbsp12:30 PM Medical Record Number: [id number] Patient Account Number: [id number] Date of Birth/Gender: Treating RN: July 04, 1980 (41 y.o. Tonita Phoenix, [name] Primary Care Physician: Juluis Mire Other Clinician: Referring Physician: Treating Physician/Extender: Edmonia Lynch in Treatment: 76 Education Assessment Education Provided To: Patient Education Topics Provided Wound/Skin Impairment: Methods: Explain/Verbal Responses: Reinforcements needed, State content correctly Electronic Signature(s) Signed: 01/02/2022 5:39:23 PM By: Rhae Hammock RN Entered By: Rhae Hammock on 12/31/2021 13:11:56 -------------------------------------------------------------------------------- Wound Assessment Details Patient Name: Date of Service: [name] [name] 12/31/2021 12:30 PM Medical Record Number:  [id number] Patient Account Number: [id number] Date of Birth/Sex: Treating RN: 10-21-1980 (41 y.o. 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, Medial Amputation Site - Transmetatarsal Secondary Open Surgical Wound Etiology: Wounding Event: Surgical Injury Wound Status: Open Date Acquired: 07/10/2021 Comorbid History: Hypertension, Type II Diabetes, Osteomyelitis, Weeks Of Treatment: 24 Neuropathy Clustered Wound: No Photos Wound Measurements Length: (cm) 0.3 Width: (cm) 0.3 Nihiser, Betzabe L ([id number]) Depth: (cm) 0.7 Area: (cm) 0.071 Volume: (cm) 0.049 % Reduction in Area: 39.8% % Reduction in Volume: -40% [id number] Page 7 of 8 Epithelialization: None Tunneling: No Undermining: No Wound Description Classification: Grade 3 Wound Margin: Distinct, outline attached Exudate Amount: Medium Exudate Type: Serosanguineous Exudate Color: red, brown Foul Odor After Cleansing: No Slough/Fibrino Yes Wound Bed Granulation Amount: Large (67-100%) Exposed Structure Granulation Quality: Pink, Pale Fascia Exposed: No Necrotic Amount: Small (1-33%) Fat Layer (Subcutaneous Tissue) Exposed: Yes Tendon Exposed: No Muscle Exposed: No Joint Exposed: No Bone Exposed: No Periwound Skin Texture Texture Color No Abnormalities Noted: No No Abnormalities Noted: Yes Callus: Yes Temperature / Pain Crepitus: No Temperature: No Abnormality Excoriation: No Induration: No Rash: No Scarring: No Moisture No Abnormalities Noted: No Dry / Scaly: No Maceration: No Treatment Notes Wound #2 (Amputation Site - Transmetatarsal) Wound Laterality: Right, Medial Cleanser Normal Saline Discharge Instruction: Cleanse the  wound with Normal Saline prior to applying a clean dressing using gauze sponges, not tissue or cotton balls. Peri-Wound Care Topical Gentamicin Discharge Instruction: As directed by physician Mupirocin Ointment Discharge Instruction: Apply Mupirocin (Bactroban) as instructed Primary Dressing  Promogran Prisma Matrix, 4.34 (sq in) (silver collagen) Discharge Instruction: Moisten collagen with saline or hydrogel Secondary Dressing Woven Gauze Sponge, Non-Sterile 4x4 in Discharge Instruction: Apply over primary dressing as directed. Secured With [employer], 4.5x3.1 (in/yd) Discharge Instruction: Secure with Kerlix as directed. Compression Wrap Compression Stockings Add-Ons Electronic Signature(s) Signed: 12/31/2021 3:55:49 PM By: Sharyn Creamer RN, BSN Entered By: Sharyn Creamer on 12/31/2021 13:08:37 Melcher, Shanda Bumps ([id number]) [id number] Page 8 of 8 -------------------------------------------------------------------------------- Vitals Details Patient Name: Date of Service: [name] IS, [name] [name] 12/31/2021 12:30 PM Medical Record Number: [id number] Patient Account Number: [id number] Date of Birth/Sex: Treating RN: Jan 04, 1981 (41 y.o. [name] Primary Care Vayden Weinand: Juluis Mire Other Clinician: Referring Shandra Szymborski: Treating Jarrick Fjeld/Extender: Edmonia Lynch in Treatment: 49 Vital Signs Time Taken: 12:55 Temperature (F): 98.6 Height (in): 69 Pulse (bpm): 75 Respiratory Rate (breaths/min): 18 Blood Pressure (mmHg): 110/77 Reference Range: 80 - 120 mg / dl Electronic Signature(s) Signed: 12/31/2021 3:55:49 PM By: Sharyn Creamer RN, BSN Entered By: Sharyn Creamer on 12/31/2021 12:55:41

## 2022-01-03 NOTE — Progress Notes (Signed)
[name] ([id number]) [id number] Page 1 of 10 Visit Report for 12/31/2021 Chief Complaint Document Details Patient Name: Date of Service: [name] IS, [name]. 12/31/2021 12:30 PM Medical Record Number: [id number] Patient Account Number: [id number] Date of Birth/Sex: Treating RN: [birth year] (41 y.o. F) Primary Care Provider: Gwinda Passe Other Clinician: Referring Provider: Treating Provider/Extender: Grace Isaac in Treatment: 49 Information Obtained from: Patient Chief Complaint Osteomyelitis of the right foot status post transmetatarsal amputation with surgical site dehiscence Electronic Signature(s) Signed: 12/31/2021 3:43:30 PM By: Geralyn Corwin DO Entered By: Geralyn Corwin on 12/31/2021 13:27:23 -------------------------------------------------------------------------------- Debridement Details Patient Name: Date of Service: [name]. 12/31/2021 12:30 PM Medical Record Number: [id number] Patient Account Number: [id number] Date of Birth/Sex: Treating RN: [birth year] (41 y.o. [name], [name] Primary Care Provider: Gwinda Passe Other Clinician: Referring Provider: Treating Provider/Extender: Grace Isaac in Treatment: 49 Debridement Performed for Assessment: Wound #2 Right,Medial Amputation Site - Transmetatarsal Performed By: Physician Geralyn Corwin, DO Debridement Type: Debridement Severity of Tissue Pre Debridement: Fat layer exposed Level of Consciousness (Pre-procedure): Awake and Alert Pre-procedure Verification/Time Out Yes - 13:15 Taken: Start Time: 13:15 Pain Control: Lidocaine T Area Debrided ([name] x W): otal 0.3 (cm) x 0.3 (cm) = 0.09 (cm) Tissue and other material debrided: Viable, Non-Viable, Callus Level: Non-Viable Tissue Debridement Description: Selective/Open Wound Instrument: Curette Bleeding: Minimum Hemostasis Achieved: Pressure End  Time: 13:15 Procedural Pain: 0 Post Procedural Pain: 0 Response to Treatment: Procedure was tolerated well Level of Consciousness (Post- Awake and Alert procedure): Post Debridement Measurements of Total Wound Length: (cm) 0.3 Width: (cm) 0.3 Depth: (cm) 0.7 Volume: (cm) 0.049 Character of Wound/Ulcer Post Debridement: Improved Severity of Tissue Post Debridement: Fat layer exposed Dejoy, [name] ([id number]) [id number] Page 2 of 10 Post Procedure Diagnosis Same as Pre-procedure Electronic Signature(s) Signed: 12/31/2021 3:43:30 PM By: Geralyn Corwin DO Signed: 01/02/2022 5:39:23 PM By: Fonnie Mu RN Entered By: Fonnie Mu on 12/31/2021 13:19:50 -------------------------------------------------------------------------------- HPI Details Patient Name: Date of Service: [name]. 12/31/2021 12:30 PM Medical Record Number: [id number] Patient Account Number: [id number] Date of Birth/Sex: Treating RN: [birth year] (41 y.o. F) Primary Care Provider: Gwinda Passe Other Clinician: Referring Provider: Treating Provider/Extender: Grace Isaac in Treatment: 80 History of Present Illness HPI Description: Admission 01/22/2021 [name] is a 41 year old female with a past medical history of insulin-dependent uncontrolled type 2 diabetes with last hemoglobin A1c of 13.5, osteomyelitis of the right foot status post transmetatarsal amputation on 12/18/2020 that presents to the clinic for right foot wound. She has had dehiscence of the surgical site. She is currently using wet-to-dry dressings. She has a PICC line and receiving IV ceftriaxone daily for her osteomyelitis. There is an end date of 01/27/2021. She is also taking oral metronidazole. She currently denies systemic signs of infection. 1/19; patient presents for follow-up. She was diagnosed with a DVT to the right lower extremity 2 days ago. She is on  Eliquis now. She is scheduled to see her infectious disease doctor tomorrow. She has been using Dakin's wet-to-dry dressings. She denies systemic signs of infection. 1/26; patient presents for follow-up. She saw infectious disease on 1/21 started on Augmentin. Her PICC line and IV ceftriaxone was discontinued. Patient reports stability to her wound. She has been using Dakin's wet-to-dry dressings. She currently denies systemic signs of infection. 2/3; patient presents for follow-up. She continues to use Dakin's wet-to-dry dressings to the wound bed. She  saw Dr. Manson PasseyMandahar with infectious disease yesterday and is continuing Augmentin. T entative end date is 2/16. Patient reports following up with orthopedics. She states there is no further plan from them. She currently denies systemic signs of infection. 2/10; patient presents for follow-up. She continues to use Dakin's wet to dry dressings. She is scheduled to have her MRI done on 2/14. She states that she had pain to the debridement site from last clinic visit and declines debridement today. She denies systemic signs of infection. She continues to have yellow thick drainage. 2/20; patient presents for follow-up. She continues to use Dakin's wet-to-dry dressings. She obtained her MRI. The results showed an abscess and she is scheduled to see her orthopedic surgeon on 2/23. She saw infectious disease 2/17 and her antibiotics were extended. She currently denies systemic signs of infection. 3/6; patient presents for follow-up. She had debridement and irrigation of her foot on 03/10/2021 due to abscess noted on MRI. She was started on IV ceftriaxone and oral Flagyl. She has no issues or complaints today. She has been using iodoform packing to the tunnel and Dakin's wet-to-dry to the opening. 03/26/2021: She continues on IV ceftriaxone and oral metronidazole. She has follow-up with infectious disease tomorrow. No significant issues or complaints today. Her  mother continues to help her with her wound dressing, using iodoform packing strips into the tunnel and Dakin's to the open portion of the wound. 3/20; patient presents for follow-up. She continues to be on IV ceftriaxone in oral metronidazole. She has been using iodoform to the tunnel and Dakin's wet-to- dry to the open wound. She denies signs of infection. 3/27; patient presents for follow-up. She no longer has a PICC line. She has been using iodoform to the tunnel and Dakin's wet-to-dry to the open wound. She reports improvement in wound healing. She denies signs of infection. 4/3; patient presents for follow-up. She states she has been using Hydrofera Blue to the open wound and iodoform packing to the tunnel without any issues. She denies signs of infection. 4/18; patient presents for follow-up. She saw infectious disease on 4/11. She has finished her oral antibiotics and completed a total of 6 weeks of antibiotics (this includes IV as well). No further antibiotics needed. She has been using Hydrofera Blue and iodoform packing. She states that the tunneled area has come in and the iodoform is not staying in place anymore. She has no issues or complaints today. She denies signs of infection. 4/24; patient presents for follow-up. She saw Dr. Carlene CoriaLubin's, plastic surgery to discuss potential skin graft/substitute placement. At this time he thinks that the skin graft would likely not take. He is in agreement with trying a wound VAC. Patient has been using Hydrofera Blue dressing changes with no issues. She denies signs of infection. She reports improvement in wound healing. 5/1; patient presents for follow-up. Unfortunately patient did not have insurance when we ran for the pico. There is an assistance program and we are trying to get this accommodated for the patient. In the meantime she has been using Hydrofera Blue without any issues. She denies signs of infection. 5/8; patient presents for follow-up.  We have not heard back if pico is covered by her insurance. She has been using collagen to the wound bed over the past week. She denies signs of infection. 5/18; patient presents for follow-up. She has been using collagen to the wound bed without issues. Again we have not heard if pico is covered by her insurance. She has  no issues or complaints today. [name], [name] ([id number]) [id number] Page 3 of 10 5/23; patient presents for follow-up. She has been using collagen to the wound bed. She has no issues or complaints today. She obtained the wound VAC from [facility] and brought this in today. She denies signs of infection. 6/1; patient presents for follow-up. She has been using the wound VAC for the past week. She has had this changed twice since she was last here. She reports more maceration to the periwound. She denies signs of infection. 6/7; right TMA site. There are 2 wounds 0 separated by a bridge of healed tissue. The more lateral area has undermining. Both areas have healthy looking granulation at the base but relative the size of the wound is fairly deep. There is no exposed bone no evidence of infection. Her wound VAC was put on hold last week because of surrounding skin maceration she has been using collagen this week. She has a modified shoe 6/12; patient presents for follow-up. Last week the wound VAC was reinitiated. She had no issues with the wound VAC itself. Today she has maceration again noted to the surrounding skin. She denies signs of infection. 6/27; patient presents for follow-up. She has been using Medihoney to the wound bed. We took a break from the wound VAC because the periwound was macerated. She still has some areas of maceration to the distal foot where there is a callus. She currently denies signs of infection. 7/11; patient presents for follow-up. She has been using Medihoney to the wound bed. She has developed some increased warmth and erythema  to the lateral aspect of the right foot. She states this is occurred over the past week and there is increased pain. No drainage noted. 7/17; patient presents for follow-up. She has been using Medihoney to the wound bed. She completed her course of Bactrim. She reports improvement in symptoms. 7/24; Patient presents for follow up. She has been using Medihoney to the wound bed without issues. She completed another course of Bactrim. She reports improvement in her symptoms but still has some mild tenderness to the medial aspect of the foot. 7/31; Patient presents for follow-up. She has been using Medihoney and Dakin's to the wound bed. She denies signs of infection. 8/14; patient presents for follow-up. She has been using Dakin's wet-to-dry packing strips to the right medial aspect of the amputation site and Medihoney to the anterior site. She has no issues or complaints today. She has started physical therapy. She denies signs of infection. 8/29; patient presents for follow-up. She has been using Dakin's wet-to-dry packing strips to the right medial aspect of the amputation site however this is becoming more difficult to place. She did report that she had increased redness and swelling to that site and developed some drainage. It has resolved. She continues with physical therapy. 9/8; patient presents for follow-up. We have been using silver alginate to the tunneled area. She completed her course of antibiotics. She reports no pain, increased swelling or erythema. 9/21; patient presents for follow-up. She has been using gentamicin to the tunneled area. She has no issues or complaints today. She denies signs of infection. 10/2; patient presents for follow-up. She is scheduled to have her CT scan on 10/9. She currently denies signs of infection. She denies increased warmth, erythema or purulent drainage from the wound bed. She has been using Dakin's wet-to-dry dressings. 10/12; patient presents for  follow-up. She had her CT scan on 10/9 that showed A small  irregular rim-enhancing fluid pocket communicating to the overlying soft tissues of the sinus tract compatible with a small abscess. Currently she denies systemic signs of infection. She has been doing Dakin's wet-to-dry packing strips but it is hard for her to pack into the narrow opening. 10/30; patient presents for follow-up. Since last clinic visit she has had OR debridement of her Right foot by Dr. Odis Hollingshead Due to abscess noted on CT. She has been using iodoform packing. 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 She will complete 2 weeks of this and continue with oral amoxicillin for the next 6 to 12 months. She follows with Dr. Luciana Axe for this. She currently denies signs of infection. 11/13; patient presents for follow-up. Patient has been using silver alginate with gentamicin to the wound bed. She has no issues or complaints today she. She reports improvement in wound healing. 12/4; patient presents for follow-up. She has been using silver alginate and gentamicin to the wound bed. She has no issues or complaints today. 12/8; patient presents for follow-up. She has been using silver alginate with gentamicin to the wound bed. She presents for her first cast placement. She will be back early next week for her obligatory cast change. 12/12; patient presents for follow-up. We have been using collagen with antibiotic ointment to the wound bed under a total contact cast. She has tolerated this well. She is improved in wound healing. 12/18; patient presents for follow-up. We have been using collagen with antibiotic ointment under the total contact cast. She has no complaints today. Electronic Signature(s) Signed: 12/31/2021 3:43:30 PM By: Geralyn Corwin DO Entered By: Geralyn Corwin on 12/31/2021 13:31:38 -------------------------------------------------------------------------------- Physical Exam  Details Patient Name: Date of Service: [name]. 12/31/2021 12:30 PM Medical Record Number: [id number] Patient Account Number: [id number] Date of Birth/Sex: Treating RN: 1980-01-18 (41 y.o. F) Primary Care Provider: Gwinda Passe Other Clinician: Referring Provider: Treating Provider/Extender: Grace Isaac in Treatment: [address] [place] [name] ([id number]) [id number] Page 4 of 10 Constitutional respirations regular, non-labored and within target range for patient.. Cardiovascular 2+ dorsalis pedis/posterior tibialis pulses. Psychiatric pleasant and cooperative. Notes Right foot: T the transmetatarsal amputation site there is an open wound with granulation tissue, slough and callus with a tunnel to the medial aspect. No o increased warmth, erythema or purulent drainage noted. Electronic Signature(s) Signed: 12/31/2021 3:43:30 PM By: Geralyn Corwin DO Entered By: Geralyn Corwin on 12/31/2021 13:32:02 -------------------------------------------------------------------------------- Physician Orders Details Patient Name: Date of Service: [name]. 12/31/2021 12:30 PM Medical Record Number: [id number] Patient Account Number: [id number] Date of Birth/Sex: Treating RN: 1980/05/02 (41 y.o. [name], [name] Primary Care Provider: Gwinda Passe Other Clinician: Referring Provider: Treating Provider/Extender: Grace Isaac in Treatment: 28 Verbal / Phone Orders: No Diagnosis Coding Follow-up Appointments ppointment in 1 week. - Dr. Mikey Bussing Return A ppointment in 2 weeks. - Dr. Mikey Bussing Return A Anesthetic (In clinic) Topical Lidocaine 5% applied to wound bed (In clinic) Topical Lidocaine 4% applied to wound bed Bathing/ Shower/ Hygiene Other Bathing/Shower/Hygiene Orders/Instructions: - Clean with Saline or Dakins Edema Control - Lymphedema / SCD / Other Elevate legs to  the level of the heart or above for 30 minutes daily and/or when sitting, a frequency of: - throughout the day void standing for long periods of time. - Limit time/pressure on feet, reduce PT Use wheelchair instead of walker when you can. . A Moisturize legs daily. Off-Loading Total  Contact Cast to Right Lower Extremity - *****SIZE 3!!!!**** Additional Orders / Instructions Follow Nutritious Diet - -Monitor/Control Blood Sugar -High Protein Diet Wound Treatment Wound #2 - Amputation Site - Transmetatarsal Wound Laterality: Right, Medial Cleanser: Normal Saline (Generic) 1 x Per Week/30 Days Discharge Instructions: Cleanse the wound with Normal Saline prior to applying a clean dressing using gauze sponges, not tissue or cotton balls. Topical: Gentamicin 1 x Per Week/30 Days Discharge Instructions: As directed by physician Prim Dressing: Sorbalgon AG Dressing 2x2 (in/in) 1 x Per Week/30 Days ary Discharge Instructions: Apply to wound bed as instructed Secondary Dressing: Woven Gauze Sponge, Non-Sterile 4x4 in (Generic) 1 x Per Week/30 Days Discharge Instructions: Apply over primary dressing as directed. Secured With: [employer], 4.5x3.1 (in/yd) (Generic) 1 x Per Week/30 Days Discharge Instructions: Secure with Kerlix as directed. [name], [name] ([id number]) [id number] Page 5 of 10 Electronic Signature(s) Signed: 12/31/2021 3:43:30 PM By: Geralyn Corwin DO Entered By: Geralyn Corwin on 12/31/2021 13:32:09 -------------------------------------------------------------------------------- Problem List Details Patient Name: Date of Service: [name]. 12/31/2021 12:30 PM Medical Record Number: [id number] Patient Account Number: [id number] Date of Birth/Sex: Treating RN: 02-12-1980 (41 y.o. F) Primary Care Provider: Gwinda Passe Other Clinician: Referring Provider: Treating Provider/Extender: Grace Isaac  in Treatment: 77 Active Problems ICD-10 Encounter Code Description Active Date MDM Diagnosis L97.514 Non-pressure chronic ulcer of other part of right foot with necrosis of bone 01/22/2021 No Yes E11.621 Type 2 diabetes mellitus with foot ulcer 01/22/2021 No Yes M86.9 Osteomyelitis, unspecified 01/22/2021 No Yes Inactive Problems Resolved Problems Electronic Signature(s) Signed: 12/31/2021 3:43:30 PM By: Geralyn Corwin DO Entered By: Geralyn Corwin on 12/31/2021 13:27:08 -------------------------------------------------------------------------------- Progress Note Details Patient Name: Date of Service: [name]. 12/31/2021 12:30 PM Medical Record Number: [id number] Patient Account Number: [id number] Date of Birth/Sex: Treating RN: [birth year] (41 y.o. F) Primary Care Provider: Gwinda Passe Other Clinician: Referring Provider: Treating Provider/Extender: Grace Isaac in Treatment: 49 Subjective Chief Complaint Information obtained from Patient Osteomyelitis of the right foot status post transmetatarsal amputation with surgical site dehiscence History of Present Illness (HPI) [name], [name] ([id number]) [id number] Page 6 of 10 Admission 01/22/2021 Ms. Aneyah Lortz is a 41 year old female with a past medical history of insulin-dependent uncontrolled type 2 diabetes with last hemoglobin A1c of 13.5, osteomyelitis of the right foot status post transmetatarsal amputation on 12/18/2020 that presents to the clinic for right foot wound. She has had dehiscence of the surgical site. She is currently using wet-to-dry dressings. She has a PICC line and receiving IV ceftriaxone daily for her osteomyelitis. There is an end date of 01/27/2021. She is also taking oral metronidazole. She currently denies systemic signs of infection. 1/19; patient presents for follow-up. She was diagnosed with a DVT to the right lower extremity 2 days  ago. She is on Eliquis now. She is scheduled to see her infectious disease doctor tomorrow. She has been using Dakin's wet-to-dry dressings. She denies systemic signs of infection. 1/26; patient presents for follow-up. She saw infectious disease on 1/21 started on Augmentin. Her PICC line and IV ceftriaxone was discontinued. Patient reports stability to her wound. She has been using Dakin's wet-to-dry dressings. She currently denies systemic signs of infection. 2/3; patient presents for follow-up. She continues to use Dakin's wet-to-dry dressings to the wound bed. She saw Dr. Manson Passey with infectious disease yesterday and is continuing Augmentin. T entative end date is 2/16. Patient reports following up with orthopedics.  She states there is no further plan from them. She currently denies systemic signs of infection. 2/10; patient presents for follow-up. She continues to use Dakin's wet to dry dressings. She is scheduled to have her MRI done on 2/14. She states that she had pain to the debridement site from last clinic visit and declines debridement today. She denies systemic signs of infection. She continues to have yellow thick drainage. 2/20; patient presents for follow-up. She continues to use Dakin's wet-to-dry dressings. She obtained her MRI. The results showed an abscess and she is scheduled to see her orthopedic surgeon on 2/23. She saw infectious disease 2/17 and her antibiotics were extended. She currently denies systemic signs of infection. 3/6; patient presents for follow-up. She had debridement and irrigation of her foot on 03/10/2021 due to abscess noted on MRI. She was started on IV ceftriaxone and oral Flagyl. She has no issues or complaints today. She has been using iodoform packing to the tunnel and Dakin's wet-to-dry to the opening. 03/26/2021: She continues on IV ceftriaxone and oral metronidazole. She has follow-up with infectious disease tomorrow. No significant issues or  complaints today. Her mother continues to help her with her wound dressing, using iodoform packing strips into the tunnel and Dakin's to the open portion of the wound. 3/20; patient presents for follow-up. She continues to be on IV ceftriaxone in oral metronidazole. She has been using iodoform to the tunnel and Dakin's wet-to- dry to the open wound. She denies signs of infection. 3/27; patient presents for follow-up. She no longer has a PICC line. She has been using iodoform to the tunnel and Dakin's wet-to-dry to the open wound. She reports improvement in wound healing. She denies signs of infection. 4/3; patient presents for follow-up. She states she has been using Hydrofera Blue to the open wound and iodoform packing to the tunnel without any issues. She denies signs of infection. 4/18; patient presents for follow-up. She saw infectious disease on 4/11. She has finished her oral antibiotics and completed a total of 6 weeks of antibiotics (this includes IV as well). No further antibiotics needed. She has been using Hydrofera Blue and iodoform packing. She states that the tunneled area has come in and the iodoform is not staying in place anymore. She has no issues or complaints today. She denies signs of infection. 4/24; patient presents for follow-up. She saw Dr. Carlene Coria, plastic surgery to discuss potential skin graft/substitute placement. At this time he thinks that the skin graft would likely not take. He is in agreement with trying a wound VAC. Patient has been using Hydrofera Blue dressing changes with no issues. She denies signs of infection. She reports improvement in wound healing. 5/1; patient presents for follow-up. Unfortunately patient did not have insurance when we ran for the pico. There is an assistance program and we are trying to get this accommodated for the patient. In the meantime she has been using Hydrofera Blue without any issues. She denies signs of infection. 5/8; patient  presents for follow-up. We have not heard back if pico is covered by her insurance. She has been using collagen to the wound bed over the past week. She denies signs of infection. 5/18; patient presents for follow-up. She has been using collagen to the wound bed without issues. Again we have not heard if pico is covered by her insurance. She has no issues or complaints today. 5/23; patient presents for follow-up. She has been using collagen to the wound bed. She has no issues  or complaints today. She obtained the wound VAC from [facility] and brought this in today. She denies signs of infection. 6/1; patient presents for follow-up. She has been using the wound VAC for the past week. She has had this changed twice since she was last here. She reports more maceration to the periwound. She denies signs of infection. 6/7; right TMA site. There are 2 wounds 0 separated by a bridge of healed tissue. The more lateral area has undermining. Both areas have healthy looking granulation at the base but relative the size of the wound is fairly deep. There is no exposed bone no evidence of infection. Her wound VAC was put on hold last week because of surrounding skin maceration she has been using collagen this week. She has a modified shoe 6/12; patient presents for follow-up. Last week the wound VAC was reinitiated. She had no issues with the wound VAC itself. Today she has maceration again noted to the surrounding skin. She denies signs of infection. 6/27; patient presents for follow-up. She has been using Medihoney to the wound bed. We took a break from the wound VAC because the periwound was macerated. She still has some areas of maceration to the distal foot where there is a callus. She currently denies signs of infection. 7/11; patient presents for follow-up. She has been using Medihoney to the wound bed. She has developed some increased warmth and erythema to the lateral aspect of the right foot. She states this  is occurred over the past week and there is increased pain. No drainage noted. 7/17; patient presents for follow-up. She has been using Medihoney to the wound bed. She completed her course of Bactrim. She reports improvement in symptoms. 7/24; Patient presents for follow up. She has been using Medihoney to the wound bed without issues. She completed another course of Bactrim. She reports improvement in her symptoms but still has some mild tenderness to the medial aspect of the foot. 7/31; Patient presents for follow-up. She has been using Medihoney and Dakin's to the wound bed. She denies signs of infection. 8/14; patient presents for follow-up. She has been using Dakin's wet-to-dry packing strips to the right medial aspect of the amputation site and Medihoney to the anterior site. She has no issues or complaints today. She has started physical therapy. She denies signs of infection. 8/29; patient presents for follow-up. She has been using Dakin's wet-to-dry packing strips to the right medial aspect of the amputation site however this is becoming more difficult to place. She did report that she had increased redness and swelling to that site and developed some drainage. It has resolved. She continues with physical therapy. 9/8; patient presents for follow-up. We have been using silver alginate to the tunneled area. She completed her course of antibiotics. She reports no pain, [name], [name] ([id number]) [phone].pdf Page 7 of 10 increased swelling or erythema. 9/21; patient presents for follow-up. She has been using gentamicin to the tunneled area. She has no issues or complaints today. She denies signs of infection. 10/2; patient presents for follow-up. She is scheduled to have her CT scan on 10/9. She currently denies signs of infection. She denies increased warmth, erythema or purulent drainage from the wound bed. She has been using Dakin's wet-to-dry dressings. 10/12;  patient presents for follow-up. She had her CT scan on 10/9 that showed A small irregular rim-enhancing fluid pocket communicating to the overlying soft tissues of the sinus tract compatible with a small abscess. Currently she denies systemic  signs of infection. She has been doing Dakin's wet-to-dry packing strips but it is hard for her to pack into the narrow opening. 10/30; patient presents for follow-up. Since last clinic visit she has had OR debridement of her Right foot by Dr. Odis Hollingshead Due to abscess noted on CT. She has been using iodoform packing. 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 She will complete 2 weeks of this and continue with oral amoxicillin for the next 6 to 12 months. She follows with Dr. Luciana Axe for this. She currently denies signs of infection. 11/13; patient presents for follow-up. Patient has been using silver alginate with gentamicin to the wound bed. She has no issues or complaints today she. She reports improvement in wound healing. 12/4; patient presents for follow-up. She has been using silver alginate and gentamicin to the wound bed. She has no issues or complaints today. 12/8; patient presents for follow-up. She has been using silver alginate with gentamicin to the wound bed. She presents for her first cast placement. She will be back early next week for her obligatory cast change. 12/12; patient presents for follow-up. We have been using collagen with antibiotic ointment to the wound bed under a total contact cast. She has tolerated this well. She is improved in wound healing. 12/18; patient presents for follow-up. We have been using collagen with antibiotic ointment under the total contact cast. She has no complaints today. Patient History Information obtained from Patient. Family History Cancer - Paternal Grandparents, Diabetes - Mother, Hypertension - Mother, Stroke - Maternal Grandparents, No family history of Heart  Disease, Hereditary Spherocytosis, Kidney Disease, Lung Disease, Seizures, Thyroid Problems, Tuberculosis. Social History Never smoker, Marital Status - Single, Alcohol Use - Rarely, Drug Use - Prior History - Marijuana, Caffeine Use - Daily. Medical History Cardiovascular Patient has history of Hypertension Endocrine Patient has history of Type II Diabetes Musculoskeletal Patient has history of Osteomyelitis - Right Transmet 12/18/20 Neurologic Patient has history of Neuropathy Hospitalization/Surgery History - inpatient right foot abscess 10/12-10/18/2023. Objective Constitutional respirations regular, non-labored and within target range for patient.. Vitals Time Taken: 12:55 PM, Height: 69 in, Temperature: 98.6 F, Pulse: 75 bpm, Respiratory Rate: 18 breaths/min, Blood Pressure: 110/77 mmHg. Cardiovascular 2+ dorsalis pedis/posterior tibialis pulses. Psychiatric pleasant and cooperative. General Notes: Right foot: T the transmetatarsal amputation site there is an open wound with granulation tissue, slough and callus with a tunnel to the medial o aspect. No increased warmth, erythema or purulent drainage noted. Integumentary (Hair, Skin) Wound #2 status is Open. Original cause of wound was Surgical Injury. The date acquired was: 07/10/2021. The wound has been in treatment 24 weeks. The wound is located on the Right,Medial Amputation Site - Transmetatarsal. The wound measures 0.3cm length x 0.3cm width x 0.7cm depth; 0.071cm^2 area and 0.049cm^3 volume. There is Fat Layer (Subcutaneous Tissue) exposed. There is no tunneling or undermining noted. There is a medium amount of serosanguineous drainage noted. The wound margin is distinct with the outline attached to the wound base. There is large (67-100%) pink, pale granulation within the wound bed. There is a small (1-33%) amount of necrotic tissue within the wound bed. The periwound skin appearance had no abnormalities noted for  color. The periwound skin appearance exhibited: Callus. The periwound skin appearance did not exhibit: Crepitus, Excoriation, Induration, Rash, Scarring, Dry/Scaly, Maceration. Periwound temperature was noted as No Abnormality. SOBIA, [name] ([id number]) [id number] Page 8 of 10 Assessment Active Problems ICD-10 Non-pressure chronic ulcer of other  part of right foot with necrosis of bone Type 2 diabetes mellitus with foot ulcer Osteomyelitis, unspecified Patient's wound is stable. I debrided nonviable tissue. At this time I recommended switching the dressing to silver alginate and continuing antibiotic ointment. Continue the total contact cast. Follow-up in 1 week. Of note patient states she is not taking her oral antibiotics prescribed by infectious disease. Procedures Wound #2 Pre-procedure diagnosis of Wound #2 is a Diabetic Wound/Ulcer of the Lower Extremity located on the Right,Medial Amputation Site - Transmetatarsal .Severity of Tissue Pre Debridement is: Fat layer exposed. There was a Selective/Open Wound Non-Viable Tissue Debridement with a total area of 0.09 sq cm performed by Geralyn Corwin, DO. With the following instrument(s): Curette to remove Viable and Non-Viable tissue/material. Material removed includes Callus after achieving pain control using Lidocaine. No specimens were taken. A time out was conducted at 13:15, prior to the start of the procedure. A Minimum amount of bleeding was controlled with Pressure. The procedure was tolerated well with a pain level of 0 throughout and a pain level of 0 following the procedure. Post Debridement Measurements: 0.3cm length x 0.3cm width x 0.7cm depth; 0.049cm^3 volume. Character of Wound/Ulcer Post Debridement is improved. Severity of Tissue Post Debridement is: Fat layer exposed. Post procedure Diagnosis Wound #2: Same as Pre-Procedure Pre-procedure diagnosis of Wound #2 is a Diabetic Wound/Ulcer of the  Lower Extremity located on the Right,Medial Amputation Site - Transmetatarsal . There was a T Research scientist (life sciences) Procedure by Geralyn Corwin, DO. otal Post procedure Diagnosis Wound #2: Same as Pre-Procedure Plan Follow-up Appointments: Return Appointment in 1 week. - Dr. Mikey Bussing Return Appointment in 2 weeks. - Dr. Mikey Bussing Anesthetic: (In clinic) Topical Lidocaine 5% applied to wound bed (In clinic) Topical Lidocaine 4% applied to wound bed Bathing/ Shower/ Hygiene: Other Bathing/Shower/Hygiene Orders/Instructions: - Clean with Saline or Dakins Edema Control - Lymphedema / SCD / Other: Elevate legs to the level of the heart or above for 30 minutes daily and/or when sitting, a frequency of: - throughout the day Avoid standing for long periods of time. - Limit time/pressure on feet, reduce PT Use wheelchair instead of walker when you can. . Moisturize legs daily. Off-Loading: T Contact Cast to Right Lower Extremity - *****SIZE 3!!!!**** otal Additional Orders / Instructions: Follow Nutritious Diet - -Monitor/Control Blood Sugar -High Protein Diet WOUND #2: - Amputation Site - Transmetatarsal Wound Laterality: Right, Medial Cleanser: Normal Saline (Generic) 1 x Per Week/30 Days Discharge Instructions: Cleanse the wound with Normal Saline prior to applying a clean dressing using gauze sponges, not tissue or cotton balls. Topical: Gentamicin 1 x Per Week/30 Days Discharge Instructions: As directed by physician Prim Dressing: Sorbalgon [employer] Dressing 2x2 (in/in) 1 x Per Week/30 Days ary Discharge Instructions: Apply to wound bed as instructed Secondary Dressing: Woven Gauze Sponge, Non-Sterile 4x4 in (Generic) 1 x Per Week/30 Days Discharge Instructions: Apply over primary dressing as directed. Secured With: [employer], 4.5x3.1 (in/yd) (Generic) 1 x Per Week/30 Days Discharge Instructions: Secure with Kerlix as directed. 1. In office sharp debridement 2. Antibiotic ointment with  silver alginate 3. T contact cast placed in standard fashion otal 4. Follow-up in 1 week Electronic Signature(s) Signed: 12/31/2021 3:43:30 PM By: Geralyn Corwin DO Entered By: Geralyn Corwin on 12/31/2021 13:33:05 [name], [name] ([id number]) [id number] Page 9 of 10 -------------------------------------------------------------------------------- HxROS Details Patient Name: Date of Service: [name] IS, [name]. 12/31/2021 12:30 PM Medical Record Number: [id number] Patient Account Number: [id number] Date of Birth/Sex: Treating  RN: [birth year] (41 y.o. F) Primary Care Provider: Gwinda Passe Other Clinician: Referring Provider: Treating Provider/Extender: Grace Isaac in Treatment: 49 Information Obtained From Patient Cardiovascular Medical History: Positive for: Hypertension Endocrine Medical History: Positive for: Type II Diabetes Time with diabetes: Dx 2009 Treated with: Insulin, Oral agents Blood sugar tested every day: Yes Tested : daily Musculoskeletal Medical History: Positive for: Osteomyelitis - Right Transmet 12/18/20 Neurologic Medical History: Positive for: Neuropathy Immunizations Pneumococcal Vaccine: Received Pneumococcal Vaccination: No Implantable Devices Yes Hospitalization / Surgery History Type of Hospitalization/Surgery inpatient right foot abscess 10/12-10/18/2023 Family and Social History Cancer: Yes - Paternal Grandparents; Diabetes: Yes - Mother; Heart Disease: No; Hereditary Spherocytosis: No; Hypertension: Yes - Mother; Kidney Disease: No; Lung Disease: No; Seizures: No; Stroke: Yes - Maternal Grandparents; Thyroid Problems: No; Tuberculosis: No; Never smoker; Marital Status - Single; Alcohol Use: Rarely; Drug Use: Prior History - Marijuana; Caffeine Use: Daily; Financial Concerns: No; Food, Clothing or Shelter Needs: No; Support System Lacking: No; Transportation Concerns:  No Electronic Signature(s) Signed: 12/31/2021 3:43:30 PM By: Geralyn Corwin DO Entered By: Geralyn Corwin on 12/31/2021 13:31:44 -------------------------------------------------------------------------------- Total Contact Cast Details Patient Name: Date of Service: [name]. 12/31/2021 12:30 PM Medical Record Number: [id number] Patient Account Number: [id number] [name], [name] ([id number]) [phone].pdf Page 10 of 10 Date of Birth/Sex: Treating RN: August 27, 1980 (41 y.o. [name], [name] Primary Care Provider: Other Clinician: Gwinda Passe Referring Provider: Treating Provider/Extender: Grace Isaac in Treatment: 84 T Contact Cast Applied for Wound Assessment: otal Wound #2 Right,Medial Amputation Site - Transmetatarsal Performed By: Physician Geralyn Corwin, DO Post Procedure Diagnosis Same as Pre-procedure Electronic Signature(s) Signed: 12/31/2021 3:43:30 PM By: Geralyn Corwin DO Signed: 01/02/2022 5:39:23 PM By: Fonnie Mu RN Entered By: Fonnie Mu on 12/31/2021 13:20:07 -------------------------------------------------------------------------------- SuperBill Details Patient Name: Date of Service: [name]. 12/31/2021 Medical Record Number: [id number] Patient Account Number: [id number] Date of Birth/Sex: Treating RN: 1980/05/28 (41 y.o. [name], [name] Primary Care Provider: Gwinda Passe Other Clinician: Referring Provider: Treating Provider/Extender: Grace Isaac in Treatment: 49 Diagnosis Coding ICD-10 Codes Code Description [phone] Non-pressure chronic ulcer of other part of right foot with necrosis of bone E11.621 Type 2 diabetes mellitus with foot ulcer M86.9 Osteomyelitis, unspecified Facility Procedures : CPT4 Code: [id number] Description: [phone] - DEBRIDE WOUND 1ST 20 SQ CM OR < ICD-10 Diagnosis Description L97.514  Non-pressure chronic ulcer of other part of right foot with necrosis of bone E11.621 Type 2 diabetes mellitus with foot ulcer Modifier: Quantity: 1 Physician Procedures : CPT4 Code Description Modifier [phone] [id number] - WC PHYS DEBR WO ANESTH 20 SQ CM ICD-10 Diagnosis Description L97.514 Non-pressure chronic ulcer of other part of right foot with necrosis of bone E11.621 Type 2 diabetes mellitus with foot ulcer Quantity: 1 Electronic Signature(s) Signed: 12/31/2021 3:43:30 PM By: Geralyn Corwin DO Entered By: Geralyn Corwin on 12/31/2021 13:33:15

## 2022-01-08 ENCOUNTER — Other Ambulatory Visit (HOSPITAL_COMMUNITY): Payer: Self-pay

## 2022-01-08 ENCOUNTER — Encounter (HOSPITAL_BASED_OUTPATIENT_CLINIC_OR_DEPARTMENT_OTHER): Payer: Medicaid Other | Admitting: Internal Medicine

## 2022-01-09 ENCOUNTER — Encounter (HOSPITAL_BASED_OUTPATIENT_CLINIC_OR_DEPARTMENT_OTHER): Payer: Medicaid Other | Admitting: General Surgery

## 2022-01-09 DIAGNOSIS — E11621 Type 2 diabetes mellitus with foot ulcer: Secondary | ICD-10-CM | POA: Diagnosis not present

## 2022-01-09 NOTE — Progress Notes (Signed)
BRYLYN, NOVAKOVICH ([id number]) [id number] Page 1 of 1 Visit Report for 01/09/2022 SuperBill Details Patient Name: Date of Service: [name] IS, [place] [name] 01/09/2022 Medical Record Number: [id number] Patient Account Number: [id number] Date of Birth/Sex: Treating RN: Sep 17, 1980 (41 y.o. [name] Primary Care Provider: Gwinda Passe Other Clinician: Referring Provider: Treating Provider/Extender: Sharyl Nimrod in Treatment: 50 Diagnosis Coding ICD-10 Codes Code Description [phone] Non-pressure chronic ulcer of other part of right foot with necrosis of bone E11.621 Type 2 diabetes mellitus with foot ulcer M86.9 Osteomyelitis, unspecified Facility Procedures CPT4 Code Description Modifier Quantity [id number] [phone] - WOUND CARE VISIT-LEV 2 EST PT 1 Electronic Signature(s) Signed: 01/09/2022 12:13:53 PM By: Duanne Guess MD FACS Signed: 01/09/2022 12:22:10 PM By: Redmond Pulling RN, BSN Entered By: Redmond Pulling on 01/09/2022 12:12:46

## 2022-01-09 NOTE — Progress Notes (Signed)
[name] ([id number]) [id number] Page 1 of 5 Visit Report for 01/09/2022 Arrival Information Details Patient Name: Date of Service: [name] IS, [name] [name]. 01/09/2022 10:15 A M Medical Record Number: [id number] Patient Account Number: [id number] Date of Birth/Sex: Treating RN: 01-31-1980 (41 y.o. [name], [name] Primary Care [name]: [name] Other Clinician: Referring [name]: Treating [name]/Extender: [name] in Treatment: 37 Visit Information History Since Last Visit Added or deleted any medications: No Patient Arrived: Wheel Chair Any new allergies or adverse reactions: No Arrival Time: 12:08 Had a fall or experienced change in No Accompanied By: self activities of daily living that may affect Transfer Assistance: None risk of falls: Patient Identification Verified: Yes Signs or symptoms of abuse/neglect since last visito No Secondary Verification Process Completed: Yes Hospitalized since last visit: No Patient Requires Transmission-Based No Implantable device outside of the clinic excluding No Precautions: cellular tissue based products placed in the center Patient Has Alerts: Yes since last visit: Patient Alerts: Patient on Blood Thinner Has Dressing in Place as Prescribed: Yes PICC R Arm Has Footwear/Offloading in Place as Prescribed: Yes ABI 12/17/20 R=1.08 Right: T Contact Cast otal [name]=1.13 Pain Present Now: No Electronic Signature(s) Signed: 01/09/2022 12:22:10 PM By: [name] Creamer RN, BSN Entered By: [name] on 01/09/2022 12:10:40 -------------------------------------------------------------------------------- Clinic Level of Care Assessment Details Patient Name: Date of Service: [name] V IS, MA [name]. 01/09/2022 10:15 A M Medical Record Number: [id number] Patient Account Number: [id number] Date of Birth/Sex: Treating RN: [birth year] (41 y.o. [name] Primary Care  [name]: [name] Other Clinician: Referring [name]: Treating [name]/Extender: [name] in Treatment: 50 Clinic Level of Care Assessment Items TOOL 4 Quantity Score X- 1 0 Use when only an EandM is performed on FOLLOW-UP visit ASSESSMENTS - Nursing Assessment / Reassessment X- 1 10 Reassessment of Co-morbidities (includes updates in patient status) X- 1 5 Reassessment of Adherence to Treatment Plan ASSESSMENTS - Wound and Skin A ssessment / Reassessment []  - 0 Simple Wound Assessment / Reassessment - one wound []  - 0 Complex Wound Assessment / Reassessment - multiple wounds []  - 0 Dermatologic / Skin Assessment (not related to wound area) ASSESSMENTS - Focused Assessment []  - 0 Circumferential Edema Measurements - multi extremities [name] ([id number]) [id number] Page 2 of 5 []  - 0 Nutritional Assessment / Counseling / Intervention []  - 0 Lower Extremity Assessment (monofilament, tuning fork, pulses) []  - 0 Peripheral Arterial Disease Assessment (using hand held doppler) ASSESSMENTS - Ostomy and/or Continence Assessment and Care []  - 0 Incontinence Assessment and Management []  - 0 Ostomy Care Assessment and Management (repouching, etc.) PROCESS - Coordination of Care X - Simple Patient / Family Education for ongoing care 1 15 []  - 0 Complex (extensive) Patient / Family Education for ongoing care []  - 0 Staff obtains Programmer, systems, Records, T Results / Process Orders est []  - 0 Staff telephones HHA, Nursing Homes / Clarify orders / etc []  - 0 Routine Transfer to another Facility (non-emergent condition) []  - 0 Routine Hospital Admission (non-emergent condition) []  - 0 New Admissions / Biomedical engineer / Ordering NPWT Apligraf, etc. , []  - 0 Emergency Hospital Admission (emergent condition) []  - 0 Simple Discharge Coordination []  - 0 Complex (extensive) Discharge  Coordination PROCESS - Special Needs []  - 0 Pediatric / Minor Patient Management []  - 0 Isolation Patient Management []  - 0 Hearing / Language / Visual special needs []  - 0 Assessment of Community assistance (transportation, D/C  planning, etc.) []  - 0 Additional assistance / Altered mentation []  - 0 Support Surface(s) Assessment (bed, cushion, seat, etc.) INTERVENTIONS - Wound Cleansing / Measurement X - Simple Wound Cleansing - one wound 1 5 []  - 0 Complex Wound Cleansing - multiple wounds []  - 0 Wound Imaging (photographs - any number of wounds) []  - 0 Wound Tracing (instead of photographs) []  - 0 Simple Wound Measurement - one wound []  - 0 Complex Wound Measurement - multiple wounds INTERVENTIONS - Wound Dressings []  - 0 Small Wound Dressing one or multiple wounds X- 1 15 Medium Wound Dressing one or multiple wounds []  - 0 Large Wound Dressing one or multiple wounds X- 1 5 Application of Medications - topical []  - 0 Application of Medications - injection INTERVENTIONS - Miscellaneous []  - 0 External ear exam []  - 0 Specimen Collection (cultures, biopsies, blood, body fluids, etc.) []  - 0 Specimen(s) / Culture(s) sent or taken to Lab for analysis []  - 0 Patient Transfer (multiple staff / Harrel Lemon Lift / Similar devices) []  - 0 Simple Staple / Suture removal (25 or less) []  - 0 Complex Staple / Suture removal (26 or more) [name] ([id number]) [id number] Page 3 of 5 []  - 0 Hypo / Hyperglycemic Management (close monitor of Blood Glucose) []  - 0 Ankle / Brachial Index (ABI) - do not check if billed separately []  - 0 Vital Signs Has the patient been seen at the hospital within the last three years: Yes Total Score: 55 Level Of Care: New/Established - Level 2 Electronic Signature(s) Signed: 01/09/2022 12:22:10 PM By: [name] Creamer RN, BSN Entered By: [name] on 01/09/2022  12:12:29 -------------------------------------------------------------------------------- Encounter Discharge Information Details Patient Name: Date of Service: [name] V IS, MA [name]. 01/09/2022 10:15 A M Medical Record Number: [id number] Patient Account Number: [id number] Date of Birth/Sex: Treating RN: [name] 21, 1982 (41 y.o. [name] Primary Care Amarius Toto: [name] Other Clinician: Referring Anniebell Bedore: Treating Laylah Riga/Extender: [name] in Treatment: 58 Encounter Discharge Information Items Discharge Condition: Stable Ambulatory Status: Wheelchair Discharge Destination: Home Transportation: Private Auto Accompanied By: self Schedule Follow-up Appointment: Yes Clinical Summary of Care: Patient Declined Electronic Signature(s) Signed: 01/09/2022 12:22:10 PM By: [name] Creamer RN, BSN Entered By: [name] on 01/09/2022 12:10:29 -------------------------------------------------------------------------------- Patient/Caregiver Education Details Patient Name: Date of Service: [name] [name], MA [place]. 12/27/2023andnbsp10:15 A M Medical Record Number: [id number] Patient Account Number: [id number] Date of Birth/Gender: Treating RN: [birth year] (41 y.o. [name] Primary Care Physician: [name] Other Clinician: Referring Physician: Treating Physician/Extender: [name] in Treatment: 28 Education Assessment Education Provided To: Patient Education Topics Provided Wound/Skin Impairment: Methods: Explain/Verbal Responses: State content correctly Electronic Signature(s) Savell, Micayla [name] ([id number]) [id number] Page 4 of 5 Signed: 01/09/2022 12:22:10 PM By: [name] Creamer RN, BSN Entered By: [name] on 01/09/2022 12:10:13 -------------------------------------------------------------------------------- Wound Assessment Details Patient Name: Date of Service: [name]  V IS, MA [name]. 01/09/2022 10:15 A M Medical Record Number: [id number] Patient Account Number: [id number] Date of Birth/Sex: Treating RN: 03-05-1980 (41 y.o. 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, Medial Amputation Site - Transmetatarsal Secondary Etiology: Open Surgical Wound Wounding Event: Surgical Injury Wound Status: Open Date Acquired: 07/10/2021 Weeks Of Treatment: 26 Clustered Wound: No Wound Measurements Length: (cm) 0.3 Width: (cm) 0.3 Depth: (cm)  0.7 Area: (cm) 0.071 Volume: (cm) 0.049 % Reduction in Area: 39.8% % Reduction in Volume: -40% Wound Description Classification: Grade 3 Exudate Amount: Medium Exudate Type: Serosanguineous Exudate Color: red, brown Periwound Skin Texture Texture Color No Abnormalities Noted: No No Abnormalities Noted: No Moisture No Abnormalities Noted: No Treatment Notes Wound #2 (Amputation Site - Transmetatarsal) Wound Laterality: Right, Medial Cleanser Normal Saline Discharge Instruction: Cleanse the wound with Normal Saline prior to applying a clean dressing using gauze sponges, not tissue or cotton [name]. Peri-Wound Care Topical Gentamicin Discharge Instruction: As directed by physician Primary Dressing Sorbalgon AG Dressing 2x2 (in/in) Discharge Instruction: Apply to wound bed as instructed Secondary Dressing Woven Gauze Sponge, Non-Sterile 4x4 in Discharge Instruction: Apply over primary dressing as directed. Secured With [employer], 4.5x3.1 (in/yd) Discharge Instruction: Secure with Kerlix as directed. BREYONA, SWANDER ([id number]) [id number] Page 5 of 5 Compression Wrap Compression Stockings Add-Ons Electronic  Signature(s) Signed: 01/09/2022 12:22:10 PM By: Redmond Pulling RN, BSN Entered By: Redmond Pulling on 01/09/2022 12:09:30

## 2022-01-15 ENCOUNTER — Encounter (HOSPITAL_BASED_OUTPATIENT_CLINIC_OR_DEPARTMENT_OTHER): Payer: Medicaid Other | Attending: Internal Medicine | Admitting: Internal Medicine

## 2022-01-15 DIAGNOSIS — Z7901 Long term (current) use of anticoagulants: Secondary | ICD-10-CM | POA: Insufficient documentation

## 2022-01-15 DIAGNOSIS — L97514 Non-pressure chronic ulcer of other part of right foot with necrosis of bone: Secondary | ICD-10-CM | POA: Diagnosis not present

## 2022-01-15 DIAGNOSIS — Z86718 Personal history of other venous thrombosis and embolism: Secondary | ICD-10-CM | POA: Diagnosis not present

## 2022-01-15 DIAGNOSIS — E1165 Type 2 diabetes mellitus with hyperglycemia: Secondary | ICD-10-CM | POA: Insufficient documentation

## 2022-01-15 DIAGNOSIS — Z794 Long term (current) use of insulin: Secondary | ICD-10-CM | POA: Insufficient documentation

## 2022-01-15 DIAGNOSIS — E11621 Type 2 diabetes mellitus with foot ulcer: Secondary | ICD-10-CM | POA: Diagnosis not present

## 2022-01-15 DIAGNOSIS — E1169 Type 2 diabetes mellitus with other specified complication: Secondary | ICD-10-CM | POA: Diagnosis not present

## 2022-01-15 DIAGNOSIS — M869 Osteomyelitis, unspecified: Secondary | ICD-10-CM | POA: Diagnosis not present

## 2022-01-15 NOTE — Progress Notes (Signed)
[name], [name] ([id number]) [id number] Page 1 of 9 Visit Report for 01/15/2022 Chief Complaint Document Details Patient Name: Date of Service: [name], [name]. 01/15/2022 2:45 PM Medical Record Number: [id number] Patient Account Number: [id number] Date of Birth/Sex: Treating RN: 06-16-1980 (42 y.o. F) Primary Care Provider: Gwinda PasseEdwards, Michelle Other Clinician: Referring Provider: Treating Provider/Extender: Grace IsaacHoffman, Cicilia Clinger Edwards, Michelle Weeks in Treatment: 6751 Information Obtained from: Patient Chief Complaint Osteomyelitis of the right foot status post transmetatarsal amputation with surgical site dehiscence Electronic Signature(s) Signed: 01/15/2022 3:41:44 PM By: Geralyn CorwinHoffman, Nathin Saran DO Entered By: Geralyn CorwinHoffman, Oshay Stranahan on 01/15/2022 15:26:55 -------------------------------------------------------------------------------- HPI Details Patient Name: Date of Service: [name]. 01/15/2022 2:45 PM Medical Record Number: [id number] Patient Account Number: [id number] Date of Birth/Sex: Treating RN: 06-16-1980 (42 y.o. F) Primary Care Provider: Gwinda PasseEdwards, Michelle Other Clinician: Referring Provider: Treating Provider/Extender: Grace IsaacHoffman, Delenn Ahn Edwards, Michelle Weeks in Treatment: 5051 History of Present Illness HPI Description: Admission 01/22/2021 [name] a 42 year old female with a past medical history of insulin-dependent uncontrolled type 2 diabetes with last hemoglobin A1c of 13.5, osteomyelitis of the right foot status post transmetatarsal amputation on 12/18/2020 that presents to the clinic for right foot wound. She has had dehiscence of the surgical site. [name] using wet-to-dry dressings. She has a PICC line and receiving IV ceftriaxone daily for her osteomyelitis. There [name] an end date of 01/27/2021. [name] also taking oral metronidazole. She currently denies systemic signs of infection. 1/19; patient presents for follow-up. She was  diagnosed with a DVT to the right lower extremity 2 days ago. [name] on Eliquis now. [name] scheduled to see her infectious disease doctor tomorrow. She has been using Dakin's wet-to-dry dressings. She denies systemic signs of infection. 1/26; patient presents for follow-up. She saw infectious disease on 1/21 started on Augmentin. Her PICC line and IV ceftriaxone was discontinued. Patient reports stability to her wound. She has been using Dakin's wet-to-dry dressings. She currently denies systemic signs of infection. 2/3; patient presents for follow-up. She continues to use Dakin's wet-to-dry dressings to the wound bed. She saw Dr. Manson PasseyMandahar with infectious disease yesterday and [name] continuing Augmentin. T entative end date [name] 2/16. Patient reports following up with orthopedics. She states there [name] no further plan from them. She currently denies systemic signs of infection. 2/10; patient presents for follow-up. She continues to use Dakin's wet to dry dressings. [name] scheduled to have her MRI done on 2/14. She states that she had pain to the debridement site from last clinic visit and declines debridement today. She denies systemic signs of infection. She continues to have yellow thick drainage. 2/20; patient presents for follow-up. She continues to use Dakin's wet-to-dry dressings. She obtained her MRI. The results showed an abscess and [name] scheduled to see her orthopedic surgeon on 2/23. She saw infectious disease 2/17 and her antibiotics were extended. She currently denies systemic signs of infection. 3/6; patient presents for follow-up. She had debridement and irrigation of her foot on 03/10/2021 due to abscess noted on MRI. She was started on IV ceftriaxone and oral Flagyl. She has no issues or complaints today. She has been using iodoform packing to the tunnel and Dakin's wet-to-dry to the opening. 03/26/2021: She continues on IV ceftriaxone and oral metronidazole. She has follow-up with  infectious disease tomorrow. No significant issues or complaints today. Her mother continues to help her with her wound dressing, using iodoform packing strips into the tunnel and Dakin's to  the open portion of the wound. 3/20; patient presents for follow-up. She continues to be on IV ceftriaxone in oral metronidazole. She has been using iodoform to the tunnel and Dakin's wet-toLynisha [name], [name] ([id number]) [id number] Page 2 of 9 dry to the open wound. She denies signs of infection. 3/27; patient presents for follow-up. She no longer has a PICC line. She has been using iodoform to the tunnel and Dakin's wet-to-dry to the open wound. She reports improvement in wound healing. She denies signs of infection. 4/3; patient presents for follow-up. She states she has been using Hydrofera Blue to the open wound and iodoform packing to the tunnel without any issues. She denies signs of infection. 4/18; patient presents for follow-up. She saw infectious disease on 4/11. She has finished her oral antibiotics and completed a total of 6 weeks of antibiotics (this includes IV as well). No further antibiotics needed. She has been using Hydrofera Blue and iodoform packing. She states that the tunneled area has come in and the iodoform [name] not staying in place anymore. She has no issues or complaints today. She denies signs of infection. 4/24; patient presents for follow-up. She saw Dr. Carlene Coria, plastic surgery to discuss potential skin graft/substitute placement. At this time he thinks that the skin graft would likely not take. He [name] in agreement with trying a wound VAC. Patient has been using Hydrofera Blue dressing changes with no issues. She denies signs of infection. She reports improvement in wound healing. 5/1; patient presents for follow-up. Unfortunately patient did not have insurance when we ran [place] the [place]. There [name] an assistance program and we are trying to get this  accommodated for the patient. In the meantime she has been using Hydrofera Blue without any issues. She denies signs of infection. 5/8; patient presents for follow-up. We have not heard back if pico [name] covered by her insurance. She has been using collagen to the wound bed over the past week. She denies signs of infection. 5/18; patient presents for follow-up. She has been using collagen to the wound bed without issues. Again we have not heard if pico [name] covered by her insurance. She has no issues or complaints today. 5/23; patient presents for follow-up. She has been using collagen to the wound bed. She has no issues or complaints today. She obtained the wound VAC from [facility] and brought this in today. She denies signs of infection. 6/1; patient presents for follow-up. She has been using the wound VAC for the past week. She has had this changed twice since she was last here. She reports more maceration to the periwound. She denies signs of infection. 6/7; right TMA site. There are 2 wounds 0 separated by a bridge of healed tissue. The more lateral area has undermining. Both areas have healthy looking granulation at the base but relative the size of the wound [name] fairly deep. There [name] no exposed bone no evidence of infection. Her wound VAC was put on hold last week because of surrounding skin maceration she has been using collagen this week. She has a modified shoe 6/12; patient presents for follow-up. Last week the wound VAC was reinitiated. She had no issues with the wound VAC itself. Today she has maceration again noted to the surrounding skin. She denies signs of infection. 6/27; patient presents for follow-up. She has been using Medihoney to the wound bed. We took a break from the wound VAC because the periwound was macerated. She still has some areas of maceration to  the distal foot where there [name] a callus. She currently denies signs of infection. 7/11; patient presents for follow-up. She has been  using Medihoney to the wound bed. She has developed some increased warmth and erythema to the lateral aspect of the right foot. She states this [name] occurred over the past week and there [name] increased pain. No drainage noted. 7/17; patient presents for follow-up. She has been using Medihoney to the wound bed. She completed her course of Bactrim. She reports improvement in symptoms. 7/24; Patient presents for follow up. She has been using Medihoney to the wound bed without issues. She completed another course of Bactrim. She reports improvement in her symptoms but still has some mild tenderness to the medial aspect of the foot. 7/31; Patient presents for follow-up. She has been using Medihoney and Dakin's to the wound bed. She denies signs of infection. 8/14; patient presents for follow-up. She has been using Dakin's wet-to-dry packing strips to the right medial aspect of the amputation site and Medihoney to the anterior site. She has no issues or complaints today. She has started physical therapy. She denies signs of infection. 8/29; patient presents for follow-up. She has been using Dakin's wet-to-dry packing strips to the right medial aspect of the amputation site however this [name] becoming more difficult to place. She did report that she had increased redness and swelling to that site and developed some drainage. It has resolved. She continues with physical therapy. 9/8; patient presents for follow-up. We have been using silver alginate to the tunneled area. She completed her course of antibiotics. She reports no pain, increased swelling or erythema. 9/21; patient presents for follow-up. She has been using gentamicin to the tunneled area. She has no issues or complaints today. She denies signs of infection. 10/2; patient presents for follow-up. [name] scheduled to have her CT scan on 10/9. She currently denies signs of infection. She denies increased warmth, erythema or purulent drainage from the wound  bed. She has been using Dakin's wet-to-dry dressings. 10/12; patient presents for follow-up. She had her CT scan on 10/9 that showed A small irregular rim-enhancing fluid pocket communicating to the overlying soft tissues of the sinus tract compatible with a small abscess. Currently she denies systemic signs of infection. She has been doing Dakin's wet-to-dry packing strips but it [name] hard for her to pack into the narrow opening. 10/30; patient presents for follow-up. Since last clinic visit she has had OR debridement of her Right foot by Dr. Odis Hollingsheadamanathan Due to abscess noted on CT. She has been using iodoform packing. 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 She will complete 2 weeks of this and continue with oral amoxicillin for the next 6 to 12 months. She follows with Dr. Luciana Axeomer for this. She currently denies signs of infection. 11/13; patient presents for follow-up. Patient has been using silver alginate with gentamicin to the wound bed. She has no issues or complaints today she. She reports improvement in wound healing. 12/4; patient presents for follow-up. She has been using silver alginate and gentamicin to the wound bed. She has no issues or complaints today. 12/8; patient presents for follow-up. She has been using silver alginate with gentamicin to the wound bed. She presents for her first cast placement. She will be back early next week for her obligatory cast change. 12/12; patient presents for follow-up. We have been using collagen with antibiotic ointment to the wound bed under a total contact cast. She  has tolerated this well. [name] improved in wound healing. 12/18; patient presents for follow-up. We have been using collagen with antibiotic ointment under the total contact cast. She has no complaints today. 1/2; The cast was placed at last clinic visit however patient missed her follow-up and this was taken off last week at a nurse visit. Patient has  been using packing strips to the medial narrowed wound. She has noted no drainage. DEMEISHA, GERAGHTY ([id number]) [id number] Page 3 of 9 Electronic Signature(s) Signed: 01/15/2022 3:41:44 PM By: Geralyn Corwin DO Entered By: Geralyn Corwin on 01/15/2022 15:28:59 -------------------------------------------------------------------------------- Physical Exam Details Patient Name: Date of Service: [name]. 01/15/2022 2:45 PM Medical Record Number: [id number] Patient Account Number: [id number] Date of Birth/Sex: Treating RN: [birth year] (42 y.o. F) Primary Care Provider: Gwinda Passe Other Clinician: Referring Provider: Treating Provider/Extender: Grace Isaac in Treatment: 51 Constitutional respirations regular, non-labored and within target range for patient.. Cardiovascular 2+ dorsalis pedis/posterior tibialis pulses. Psychiatric pleasant and cooperative. Notes Right foot: T the transmetatarsal amputation site there [name] a narrowed area to the medial aspect that appears epithelialized inside the tunnel. No drainage noted. o No signs of infection including increased warmth erythema or purulent drainage. Electronic Signature(s) Signed: 01/15/2022 3:41:44 PM By: Geralyn Corwin DO Entered By: Geralyn Corwin on 01/15/2022 15:29:51 -------------------------------------------------------------------------------- Physician Orders Details Patient Name: Date of Service: [name]. 01/15/2022 2:45 PM Medical Record Number: [id number] Patient Account Number: [id number] Date of Birth/Sex: Treating RN: 06/11/1980 (41 y.o. [name], [name] Primary Care Provider: Gwinda Passe Other Clinician: Referring Provider: Treating Provider/Extender: Grace Isaac in Treatment: 71 Verbal / Phone Orders: No Diagnosis Coding Follow-up Appointments ppointment in 2 weeks. - on Tuesday 01/29/21 @  0930 Rm # 9 Return A Edema Control - Lymphedema / SCD / Other Elevate legs to the level of the heart or above for 30 minutes daily and/or when sitting for 3-4 times a day throughout the day. Avoid standing for long periods of time. Wound Treatment Wound #2 - Amputation Site - Transmetatarsal Wound Laterality: Right, Medial Cleanser: Soap and Water Discharge Instructions: May shower and wash wound with dial antibacterial soap and water prior to dressing change. Cleanser: Wound Cleanser Discharge Instructions: Cleanse the wound with wound cleanser prior to applying a clean dressing using gauze sponges, not tissue or cotton balls. Secondary Dressing: ABD Pad, 5x9 [name], [name] ([id number]) [id number] Page 4 of 9 Discharge Instructions: Apply over primary dressing as directed. Secured With: [employer], 4.5x3.1 (in/yd) Discharge Instructions: Secure with Kerlix as directed. Secured With: 47M Medipore H Soft Cloth Surgical T ape, 4 x 10 (in/yd) Discharge Instructions: Secure with tape as directed. Electronic Signature(s) Signed: 01/15/2022 3:41:44 PM By: Geralyn Corwin DO Entered By: Geralyn Corwin on 01/15/2022 15:29:57 -------------------------------------------------------------------------------- Problem List Details Patient Name: Date of Service: [name]. 01/15/2022 2:45 PM Medical Record Number: [id number] Patient Account Number: [id number] Date of Birth/Sex: Treating RN: 08/26/1980 (42 y.o. F) Primary Care Provider: Gwinda Passe Other Clinician: Referring Provider: Treating Provider/Extender: Grace Isaac in Treatment: 48 Active Problems ICD-10 Encounter Code Description Active Date MDM Diagnosis L97.514 Non-pressure chronic ulcer of other part of right foot with necrosis of bone 01/22/2021 No Yes E11.621 Type 2 diabetes mellitus with foot ulcer 01/22/2021 No Yes M86.9 Osteomyelitis, unspecified  01/22/2021 No Yes Inactive Problems Resolved Problems Electronic Signature(s) Signed: 01/15/2022 3:41:44 PM By: Geralyn Corwin DO Entered By: Mikey Bussing,  Janett Billow on 01/15/2022 15:26:43 -------------------------------------------------------------------------------- Progress Note Details Patient Name: Date of Service: [name] [name] [name], [name]. 01/15/2022 2:45 PM Medical Record Number: [id number] Patient Account Number: [id number] Date of Birth/Sex: Treating RN: 11-24-1980 (42 y.o. F) Primary Care Provider: Juluis Mire Other Clinician: Referring Provider: Treating Provider/Extender: Edmonia Lynch in Treatment: [address] [place] ([id number]) [id number] Page 5 of 9 Subjective Chief Complaint Information obtained from Patient Osteomyelitis of the right foot status post transmetatarsal amputation with surgical site dehiscence History of Present Illness (HPI) Admission 01/22/2021 Ms. Norely Schlick [name] a 42 year old female with a past medical history of insulin-dependent uncontrolled type 2 diabetes with last hemoglobin A1c of 13.5, osteomyelitis of the right foot status post transmetatarsal amputation on 12/18/2020 that presents to the clinic for right foot wound. She has had dehiscence of the surgical site. [name] using wet-to-dry dressings. She has a PICC line and receiving IV ceftriaxone daily for her osteomyelitis. There [name] an end date of 01/27/2021. [name] also taking oral metronidazole. She currently denies systemic signs of infection. 1/19; patient presents for follow-up. She was diagnosed with a DVT to the right lower extremity 2 days ago. [name] on Eliquis now. [name] scheduled to see her infectious disease doctor tomorrow. She has been using Dakin's wet-to-dry dressings. She denies systemic signs of infection. 1/26; patient presents for follow-up. She saw infectious disease on 1/21 started on Augmentin. Her PICC line and IV  ceftriaxone was discontinued. Patient reports stability to her wound. She has been using Dakin's wet-to-dry dressings. She currently denies systemic signs of infection. 2/3; patient presents for follow-up. She continues to use Dakin's wet-to-dry dressings to the wound bed. She saw Dr. Yvette Rack with infectious disease yesterday and [name] continuing Augmentin. T entative end date [name] 2/16. Patient reports following up with orthopedics. She states there [name] no further plan from them. She currently denies systemic signs of infection. 2/10; patient presents for follow-up. She continues to use Dakin's wet to dry dressings. [name] scheduled to have her MRI done on 2/14. She states that she had pain to the debridement site from last clinic visit and declines debridement today. She denies systemic signs of infection. She continues to have yellow thick drainage. 2/20; patient presents for follow-up. She continues to use Dakin's wet-to-dry dressings. She obtained her MRI. The results showed an abscess and [name] scheduled to see her orthopedic surgeon on 2/23. She saw infectious disease 2/17 and her antibiotics were extended. She currently denies systemic signs of infection. 3/6; patient presents for follow-up. She had debridement and irrigation of her foot on 03/10/2021 due to abscess noted on MRI. She was started on IV ceftriaxone and oral Flagyl. She has no issues or complaints today. She has been using iodoform packing to the tunnel and Dakin's wet-to-dry to the opening. 03/26/2021: She continues on IV ceftriaxone and oral metronidazole. She has follow-up with infectious disease tomorrow. No significant issues or complaints today. Her mother continues to help her with her wound dressing, using iodoform packing strips into the tunnel and Dakin's to the open portion of the wound. 3/20; patient presents for follow-up. She continues to be on IV ceftriaxone in oral metronidazole. She has been using iodoform to the  tunnel and Dakin's wet-to- dry to the open wound. She denies signs of infection. 3/27; patient presents for follow-up. She no longer has a PICC line. She has been using iodoform to the tunnel and Dakin's wet-to-dry to the open wound. She  reports improvement in wound healing. She denies signs of infection. 4/3; patient presents for follow-up. She states she has been using Hydrofera Blue to the open wound and iodoform packing to the tunnel without any issues. She denies signs of infection. 4/18; patient presents for follow-up. She saw infectious disease on 4/11. She has finished her oral antibiotics and completed a total of 6 weeks of antibiotics (this includes IV as well). No further antibiotics needed. She has been using Hydrofera Blue and iodoform packing. She states that the tunneled area has come in and the iodoform [name] not staying in place anymore. She has no issues or complaints today. She denies signs of infection. 4/24; patient presents for follow-up. She saw Dr. Carlene CoriaLubin's, plastic surgery to discuss potential skin graft/substitute placement. At this time he thinks that the skin graft would likely not take. He [name] in agreement with trying a wound VAC. Patient has been using Hydrofera Blue dressing changes with no issues. She denies signs of infection. She reports improvement in wound healing. 5/1; patient presents for follow-up. Unfortunately patient did not have insurance when we [place] the [place]. There [name] an assistance program and we are trying to get this accommodated for the patient. In the meantime she has been using Hydrofera Blue without any issues. She denies signs of infection. 5/8; patient presents for follow-up. We have not heard back if pico [name] covered by her insurance. She has been using collagen to the wound bed over the past week. She denies signs of infection. 5/18; patient presents for follow-up. She has been using collagen to the wound bed without issues. Again we have not heard  if pico [name] covered by her insurance. She has no issues or complaints today. 5/23; patient presents for follow-up. She has been using collagen to the wound bed. She has no issues or complaints today. [facility] and brought this in today. She denies signs of infection. 6/1; patient presents for follow-up. She has been using the wound VAC for the past week. She has had this changed twice since she was last here. She reports more maceration to the periwound. She denies signs of infection. 6/7; right TMA site. There are 2 wounds 0 separated by a bridge of healed tissue. The more lateral area has undermining. Both areas have healthy looking granulation at the base but relative the size of the wound [name] fairly deep. There [name] no exposed bone no evidence of infection. Her wound VAC was put on hold last week because of surrounding skin maceration she has been using collagen this week. She has a modified shoe 6/12; patient presents for follow-up. Last week the wound VAC was reinitiated. She had no issues with the wound VAC itself. Today she has maceration again noted to the surrounding skin. She denies signs of infection. 6/27; patient presents for follow-up. She has been using Medihoney to the wound bed. We took a break from the wound VAC because the periwound was macerated. She still has some areas of maceration to the distal foot where there [name] a callus. She currently denies signs of infection. 7/11; patient presents for follow-up. She has been using Medihoney to the wound bed. She has developed some increased warmth and erythema to the lateral aspect of the right foot. She states this [name] occurred over the past week and there [name] increased pain. No drainage noted. 7/17; patient presents for follow-up. She has been using Medihoney to the wound bed. She completed her course  of Bactrim. She reports improvement in symptoms. 7/24; Patient presents for follow up. She has been using  Medihoney to the wound bed without issues. She completed another course of Bactrim. She reports improvement in her symptoms but still has some mild tenderness to the medial aspect of the foot. 7/31; Patient presents for follow-up. She has been using Medihoney and Dakin's to the wound bed. She denies signs of infection. [name], [name] ([id number]) [id number] Page 6 of 9 8/14; patient presents for follow-up. She has been using Dakin's wet-to-dry packing strips to the right medial aspect of the amputation site and Medihoney to the anterior site. She has no issues or complaints today. She has started physical therapy. She denies signs of infection. 8/29; patient presents for follow-up. She has been using Dakin's wet-to-dry packing strips to the right medial aspect of the amputation site however this [name] becoming more difficult to place. She did report that she had increased redness and swelling to that site and developed some drainage. It has resolved. She continues with physical therapy. 9/8; patient presents for follow-up. We have been using silver alginate to the tunneled area. She completed her course of antibiotics. She reports no pain, increased swelling or erythema. 9/21; patient presents for follow-up. She has been using gentamicin to the tunneled area. She has no issues or complaints today. She denies signs of infection. 10/2; patient presents for follow-up. [name] scheduled to have her CT scan on 10/9. She currently denies signs of infection. She denies increased warmth, erythema or purulent drainage from the wound bed. She has been using Dakin's wet-to-dry dressings. 10/12; patient presents for follow-up. She had her CT scan on 10/9 that showed A small irregular rim-enhancing fluid pocket communicating to the overlying soft tissues of the sinus tract compatible with a small abscess. Currently she denies systemic signs of infection. She has been doing Dakin's  wet-to-dry packing strips but it [name] hard for her to pack into the narrow opening. 10/30; patient presents for follow-up. Since last clinic visit she has had OR debridement of her Right foot by Dr. Kathaleen Bury Due to abscess noted on CT. She has been using iodoform packing. 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 She will complete 2 weeks of this and continue with oral amoxicillin for the next 6 to 12 months. She follows with Dr. Linus Salmons for this. She currently denies signs of infection. 11/13; patient presents for follow-up. Patient has been using silver alginate with gentamicin to the wound bed. She has no issues or complaints today she. She reports improvement in wound healing. 12/4; patient presents for follow-up. She has been using silver alginate and gentamicin to the wound bed. She has no issues or complaints today. 12/8; patient presents for follow-up. She has been using silver alginate with gentamicin to the wound bed. She presents for her first cast placement. She will be back early next week for her obligatory cast change. 12/12; patient presents for follow-up. We have been using collagen with antibiotic ointment to the wound bed under a total contact cast. She has tolerated this well. [name] improved in wound healing. 12/18; patient presents for follow-up. We have been using collagen with antibiotic ointment under the total contact cast. She has no complaints today. 1/2; The cast was placed at last clinic visit however patient missed her follow-up and this was taken off last week at a nurse visit. Patient has been using packing strips to the medial narrowed wound. She  has noted no drainage. Patient History Information obtained from Patient. Family History Cancer - Paternal Grandparents, Diabetes - Mother, Hypertension - Mother, Stroke - Maternal Grandparents, No family history of Heart Disease, Hereditary Spherocytosis, Kidney Disease, Lung Disease,  Seizures, Thyroid Problems, Tuberculosis. Social History Never smoker, Marital Status - Single, Alcohol Use - Rarely, Drug Use - Prior History - Marijuana, Caffeine Use - Daily. Medical History Cardiovascular Patient has history of Hypertension Endocrine Patient has history of Type II Diabetes Musculoskeletal Patient has history of Osteomyelitis - Right Transmet 12/18/20 Neurologic Patient has history of Neuropathy Hospitalization/Surgery History - inpatient right foot abscess 10/12-10/18/2023. Objective Constitutional respirations regular, non-labored and within target range for patient.. Vitals Time Taken: 2:58 PM, Height: 69 in, Temperature: 98.7 F, Pulse: 74 bpm, Respiratory Rate: 17 breaths/min. Cardiovascular 2+ dorsalis pedis/posterior tibialis pulses. Psychiatric pleasant and cooperative. General Notes: Right foot: T the transmetatarsal amputation site there [name] a narrowed area to the medial aspect that appears epithelialized inside the tunnel. No o drainage noted. No signs of infection including increased warmth erythema or purulent drainage. ANQUANETTE, BAHNER ([id number]) [id number] Page 7 of 9 Integumentary (Hair, Skin) Wound #2 status [name] Open. Original cause of wound was Surgical Injury. The date acquired was: 07/10/2021. The wound has been in treatment 27 weeks. The wound [name] located on the Right,Medial Amputation Site - Transmetatarsal. The wound measures 0.1cm length x 0.1cm width x 0.1cm depth; 0.008cm^2 area and 0.001cm^3 volume. There [name] no tunneling or undermining noted. There [name] a medium amount of serosanguineous drainage noted. The wound margin [name] distinct with the outline attached to the wound base. There [name] large (67-100%) red, pink granulation within the wound bed. There [name] no necrotic tissue within the wound bed. The periwound skin appearance did not exhibit: Callus, Crepitus, Excoriation, Induration, Rash, Scarring, Dry/Scaly,  Maceration, Atrophie Blanche, Cyanosis, Ecchymosis, Hemosiderin Staining, Mottled, Pallor, Rubor, Erythema. Periwound temperature was noted as No Abnormality. The periwound has tenderness on palpation. Assessment Active Problems ICD-10 Non-pressure chronic ulcer of other part of right foot with necrosis of bone Type 2 diabetes mellitus with foot ulcer Osteomyelitis, unspecified Patient's previous wound to the medial aspect appears epithelialized within the tunnel. She has noted no drainage. At this time I recommended just keeping the area covered. This may be healed but it [name] hard to tell at this point. She may just heal with a divot. If she were to develop drainage I recommended antibiotic ointment, gentamicin for which she has at home. Plan Follow-up Appointments: Return Appointment in 2 weeks. - on Tuesday 01/29/21 @ 0930 Rm # 9 Edema Control - Lymphedema / SCD / Other: Elevate legs to the level of the heart or above for 30 minutes daily and/or when sitting for 3-4 times a day throughout the day. Avoid standing for long periods of time. WOUND #2: - Amputation Site - Transmetatarsal Wound Laterality: Right, Medial Cleanser: Soap and Water Discharge Instructions: May shower and wash wound with dial antibacterial soap and water prior to dressing change. Cleanser: Wound Cleanser Discharge Instructions: Cleanse the wound with wound cleanser prior to applying a clean dressing using gauze sponges, not tissue or cotton balls. Secondary Dressing: ABD Pad, 5x9 Discharge Instructions: Apply over primary dressing as directed. Secured With: [employer], 4.5x3.1 (in/yd) Discharge Instructions: Secure with Kerlix as directed. Secured With: 45M Medipore H Soft Cloth Surgical T ape, 4 x 10 (in/yd) Discharge Instructions: Secure with tape as directed. 1. Bandage daily 2. Follow-up in 2 weeks 3. Call with any  questions or concerns Electronic Signature(s) Signed: 01/15/2022 3:41:44 PM By:  Geralyn CorwinHoffman, Anelisse Jacobson DO Entered By: Geralyn CorwinHoffman, Ulices Maack on 01/15/2022 15:31:27 -------------------------------------------------------------------------------- HxROS Details Patient Name: Date of Service: [name]. 01/15/2022 2:45 PM Medical Record Number: [id number] Patient Account Number: [id number] Date of Birth/Sex: Treating RN: Jan 21, 1980 (42 y.o. F) Primary Care Provider: Gwinda PasseEdwards, Michelle Other Clinician: Referring Provider: Treating Provider/Extender: Grace IsaacHoffman, Keyasia Jolliff Edwards, Michelle Weeks in Treatment: 1950 Information Obtained From Patient Cardiovascular [name], Etienne [name] ([id number]) [id number] Page 8 of 9 Medical History: Positive for: Hypertension Endocrine Medical History: Positive for: Type II Diabetes Time with diabetes: Dx 2009 Treated with: Insulin, Oral agents Blood sugar tested every day: Yes Tested : daily Musculoskeletal Medical History: Positive for: Osteomyelitis - Right Transmet 12/18/20 Neurologic Medical History: Positive for: Neuropathy Immunizations Pneumococcal Vaccine: Received Pneumococcal Vaccination: No Implantable Devices Yes Hospitalization / Surgery History Type of Hospitalization/Surgery inpatient right foot abscess 10/12-10/18/2023 Family and Social History Cancer: Yes - Paternal Grandparents; Diabetes: Yes - Mother; Heart Disease: No; Hereditary Spherocytosis: No; Hypertension: Yes - Mother; Kidney Disease: No; Lung Disease: No; Seizures: No; Stroke: Yes - Maternal Grandparents; Thyroid Problems: No; Tuberculosis: No; Never smoker; Marital Status - Single; Alcohol Use: Rarely; Drug Use: Prior History - Marijuana; Caffeine Use: Daily; Financial Concerns: No; Food, Clothing or Shelter Needs: No; Support System Lacking: No; Transportation Concerns: No Electronic Signature(s) Signed: 01/15/2022 3:41:44 PM By: Geralyn CorwinHoffman, Kaelee Pfeffer DO Entered By: Geralyn CorwinHoffman, Chavy Avera on 01/15/2022  15:29:05 -------------------------------------------------------------------------------- SuperBill Details Patient Name: Date of Service: [name]. 01/15/2022 Medical Record Number: [id number] Patient Account Number: [id number] Date of Birth/Sex: Treating RN: Jan 21, 1980 (41 y.o. Ardis RowanF) Breedlove, [name] Primary Care Provider: Gwinda PasseEdwards, Michelle Other Clinician: Referring Provider: Treating Provider/Extender: Grace IsaacHoffman, Jordanna Hendrie Edwards, Michelle Weeks in Treatment: 51 Diagnosis Coding ICD-10 Codes Code Description 5395748575L97.514 Non-pressure chronic ulcer of other part of right foot with necrosis of bone E11.621 Type 2 diabetes mellitus with foot ulcer M86.9 Osteomyelitis, unspecified Facility Procedures Physician Procedures : CPT4 Code Description Modifier [id number] - WC PHYS LEVEL 3 - EST PT ICD-10 Diagnosis Description L97.514 Non-pressure chronic ulcer of other part of right foot with necrosis of bone E11.621 Type 2 diabetes mellitus with foot ulcer M86.9  Osteomyelitis, unspecified Quantity: 1 Electronic Signature(s) Signed: 01/15/2022 3:41:44 PM By: Geralyn CorwinHoffman, Lakina Mcintire DO Entered By: Geralyn CorwinHoffman, Danniel Tones on 01/15/2022 15:31:37

## 2022-01-17 NOTE — Progress Notes (Signed)
[name] ([id number]) [id number] Page 1 of 9 Visit Report for 11/12/2021 Arrival Information Details Patient Name: Date of Service: [name], [name] [name]. 11/12/2021 2:45 PM Medical Record Number: [id number] Patient Account Number: [id number] Date of Birth/Sex: Treating RN: April 01, 1980 (42 y.o. Helene Shoe, Meta.Reding Primary Care Kyelle Urbas: Juluis Mire Other Clinician: Referring Pharell Rolfson: Treating Doranne Schmutz/Extender: Edmonia Lynch in Treatment: 78 Visit Information History Since Last Visit Added or deleted any medications: Yes Patient Arrived: Wheel Chair Any new allergies or adverse reactions: No Arrival Time: 15:03 Had a fall or experienced change in No Accompanied By: mother activities of daily living that may affect Transfer Assistance: None risk of falls: Patient Identification Verified: Yes Signs or symptoms of abuse/neglect since No Secondary Verification Process Completed: Yes last visito Patient Requires Transmission-Based No Hospitalized since last visit: Yes Precautions: Implantable device outside of the clinic No Patient Has Alerts: Yes excluding Patient Alerts: Patient on Blood Thinner cellular tissue based products placed in the PICC R Arm center ABI 12/17/20 R=1.08 since last visit: [name]=1.13 Has Dressing in Place as Prescribed: Yes Has Footwear/Offloading in Place as Yes Prescribed: Right: Surgical Shoe with Pressure Relief Insole Pain Present Now: Yes Electronic Signature(s) Signed: 11/12/2021 4:53:44 PM By: [name] RN, BSN Entered By: [name] on 11/12/2021 15:05:06 -------------------------------------------------------------------------------- Clinic Level of Care Assessment Details Patient Name: Date of Service: [name], [name]. 11/12/2021 2:45 PM Medical Record Number: [id number] Patient Account Number: [id number] Date of Birth/Sex: Treating RN: 05/12/1980 (42 y.o. [name],  [name] Primary Care Conor Lata: Juluis Mire Other Clinician: Referring Antoninette Lerner: Treating [name]/Extender: Edmonia Lynch in Treatment: 42 Clinic Level of Care Assessment Items TOOL 4 Quantity Score X- 1 0 Use when only an EandM [name] ASSESSMENTS - Nursing Assessment / Reassessment X- 1 10 Reassessment of Co-morbidities (includes updates in patient status) X- 1 5 Reassessment of Adherence to Treatment Plan ASSESSMENTS - Wound and Skin A ssessment / Reassessment X - Simple Wound Assessment / Reassessment - one wound 1 5 _0  - 0 Complex Wound Assessment / Reassessment - multiple wounds _1  - 0 Dermatologic / Skin Assessment (not related to wound area) [name] ([id number]) [id number] Page 2 of 9 ASSESSMENTS - Focused Assessment X- 1 5 Circumferential Edema Measurements - multi extremities _2  - 0 Nutritional Assessment / Counseling / Intervention _3  - 0 Lower Extremity Assessment (monofilament, tuning fork, pulses) _4  - 0 Peripheral Arterial Disease Assessment (using hand held doppler) ASSESSMENTS - Ostomy and/or Continence Assessment and Care _5  - 0 Incontinence Assessment and Management _6  - 0 Ostomy Care Assessment and Management (repouching, etc.) PROCESS - Coordination of Care X - Simple Patient / Family Education for ongoing care 1 15 _7  - 0 Complex (extensive) Patient / Family Education for ongoing care X- 1 10 Staff obtains Programmer, systems, Records, T Results / Process Orders est _8  - 0 Staff telephones HHA, Nursing Homes / Clarify orders / etc _9  - 0 Routine Transfer to another Facility (non-emergent condition) _10  - 0 Routine Hospital Admission (non-emergent condition) _11  - 0 New Admissions / Biomedical engineer / Ordering NPWT Apligraf, etc. , _12  - 0 Emergency Hospital Admission (emergent condition) X- 1 10 Simple Discharge Coordination _13  - 0 Complex (extensive)  Discharge Coordination PROCESS - Special Needs _14  - 0 Pediatric / Minor Patient Management _15  - 0 Isolation Patient Management _16  - 0 Hearing / Language / Visual special needs _17  - 0 Assessment of Community assistance (transportation,  D/C planning, etc.) _0  - 0 Additional assistance / Altered mentation _1  - 0 Support Surface(s) Assessment (bed, cushion, seat, etc.) INTERVENTIONS - Wound Cleansing / Measurement X - Simple Wound Cleansing - one wound 1 5 _2  - 0 Complex Wound Cleansing - multiple wounds X- 1 5 Wound Imaging (photographs - any number of wounds) _3  - 0 Wound Tracing (instead of photographs) X- 1 5 Simple Wound Measurement - one wound _4  - 0 Complex Wound Measurement - multiple wounds INTERVENTIONS - Wound Dressings X - Small Wound Dressing one or multiple wounds 1 10 _5  - 0 Medium Wound Dressing one or multiple wounds _6  - 0 Large Wound Dressing one or multiple wounds X- 1 5 Application of Medications - topical <BPZWCHENIDPOEUMP>_5<\/TIRWERXVQMGQQPYP>_9  - 0 Application of Medications - injection INTERVENTIONS - Miscellaneous _8  - 0 External ear exam _9  - 0 Specimen Collection (cultures, biopsies, blood, body fluids, etc.) _10  - 0 Specimen(s) / Culture(s) sent or taken to Lab for analysis _11  - 0 Patient Transfer (multiple staff / Harrel Lemon Lift / Similar devices) _12  - 0 Simple Staple / Suture removal (25 or less) [name] ([id number]) [id number] Page 3 of 9 _13  - 0 Complex Staple / Suture removal (26 or more) _14  - 0 Hypo / Hyperglycemic Management (close monitor of Blood Glucose) _15  - 0 Ankle / Brachial Index (ABI) - do not check if billed separately X- 1 5 Vital Signs Has the patient been seen at the hospital within the last three years: Yes Total Score: 95 Level Of Care: New/Established - Level 3 Electronic Signature(s) Signed: 01/16/2022 5:36:43 PM By: Rhae Hammock RN Entered By: Rhae Hammock on 11/12/2021  15:32:51 -------------------------------------------------------------------------------- Encounter Discharge Information Details Patient Name: Date of Service: [name], [name]. 11/12/2021 2:45 PM Medical Record Number: [id number] Patient Account Number: [id number] Date of Birth/Sex: Treating RN: Dec 01, 1980 (42 y.o. [name] Primary Care Kendryck Lacroix: Juluis Mire Other Clinician: Referring Caeli Linehan: Treating Bahar Shelden/Extender: Edmonia Lynch in Treatment: 42 Encounter Discharge Information Items Discharge Condition: Stable Ambulatory Status: Wheelchair Discharge Destination: Home Transportation: Private Auto Accompanied By: mom Schedule Follow-up Appointment: Yes Clinical Summary of Care: Patient Declined Electronic Signature(s) Signed: 01/16/2022 5:36:43 PM By: Rhae Hammock RN Entered By: Rhae Hammock on 11/12/2021 15:33:27 -------------------------------------------------------------------------------- Lower Extremity Assessment Details Patient Name: Date of Service: [name], [name]. 11/12/2021 2:45 PM Medical Record Number: [id number] Patient Account Number: [id number] Date of Birth/Sex: Treating RN: August 30, 1980 (42 y.o. [name] Primary Care Tashari Schoenfelder: Juluis Mire Other Clinician: Referring Dashana Guizar: Treating Chesky Heyer/Extender: Geryl Councilman Weeks in Treatment: 42 Edema Assessment Assessed: [Left: No] [Right: No] Edema: [Left: Ye] [Right: s] Calf Left: Right: Point of Measurement: 34 cm From Medial Instep 47 cm Ankle Left: Right: AMARIONA, RATHJE ([id number]) [id number] Page 4 of 9 Point of Measurement: 8 cm From Medial Instep 26 cm Vascular Assessment Pulses: Dorsalis Pedis Palpable: [Right:Yes] Electronic Signature(s) Signed: 11/12/2021 4:53:44 PM By: [name] RN, BSN Entered By: [name] on 11/12/2021  15:11:04 -------------------------------------------------------------------------------- Multi Wound Chart Details Patient Name: Date of Service: [name] [name], [name]. 11/12/2021 2:45 PM Medical Record Number: [id number] Patient Account Number: [id number] Date of Birth/Sex: Treating RN: 1980-02-17 (42 y.o. F) Primary Care Othello Dickenson: Juluis Mire Other Clinician: Referring Masha Orbach: Treating Louine Tenpenny/Extender: Edmonia Lynch in Treatment: 42 Vital Signs Height(in): 69 Capillary Blood Glucose(mg/dl): 117 Weight(lbs): Pulse(bpm): 93 Body Mass Index(BMI): Blood Pressure(mmHg): 116/77 Temperature(F): 98.5 Respiratory Rate(breaths/min): 20 [2:Photos:] [N/A:N/A] Right, Medial Amputation  Site - N/A N/A Wound Location: Transmetatarsal Surgical Injury N/A N/A Wounding Event: Dehisced Wound N/A N/A Primary Etiology: Hypertension, Type II Diabetes, N/A N/A Comorbid History: Osteomyelitis, Neuropathy 07/10/2021 N/A N/A Date Acquired: 78 N/A N/A Weeks of Treatment: Open N/A N/A Wound Status: No N/A N/A Wound Recurrence: 0.4x1.6x1.3 N/A N/A Measurements [name] x W x D (cm) 0.503 N/A N/A A (cm) : rea 0.653 N/A N/A Volume (cm) : -326.30% N/A N/A % Reduction in A rea: -1765.70% N/A N/A % Reduction in Volume: 9 Starting Position 1 (o'clock): 3 Ending Position 1 (o'clock): 1.2 Maximum Distance 1 (cm): Yes N/A N/A Undermining: Full Thickness Without Exposed N/A N/A Classification: Support Structures Medium N/A N/A Exudate Amount: Serosanguineous N/A N/A Exudate Type: red, brown N/A N/A Exudate Color: Distinct, outline attached N/A N/A Wound Margin: Large (67-100%) N/A N/A Granulation Amount: Pink, Pale N/A N/A Granulation Quality: Small (1-33%) N/A N/A Necrotic Amount: Fat Layer (Subcutaneous Tissue): Yes N/A N/A Exposed Structures: HERA, CELAYA [name] ([id number]) [id number] Page 5 of 9 Fascia: No Tendon:  No Muscle: No Joint: No Bone: No None N/A N/A Epithelialization: Callus: Yes N/A N/A Periwound Skin Texture: Excoriation: No Induration: No Crepitus: No Rash: No Scarring: No Maceration: No N/A N/A Periwound Skin Moisture: Dry/Scaly: No Atrophie Blanche: No N/A N/A Periwound Skin Color: Cyanosis: No Ecchymosis: No Erythema: No Hemosiderin Staining: No Mottled: No Pallor: No Rubor: No No Abnormality N/A N/A Temperature: per patient surgery while inpatient to N/A N/A Assessment Notes: clean out the wound. Treatment Notes Electronic Signature(s) Signed: 11/16/2021 1:40:10 PM By: Kalman Shan DO Entered By: Kalman Shan on 11/12/2021 15:14:53 -------------------------------------------------------------------------------- Multi-Disciplinary Care Plan Details Patient Name: Date of Service: [name], [name]. 11/12/2021 2:45 PM Medical Record Number: [id number] Patient Account Number: [id number] Date of Birth/Sex: Treating RN: 15-Aug-1980 (42 y.o. [name] Primary Care Deysi Soldo: Juluis Mire Other Clinician: Referring Emberlee Sortino: Treating Jeannetta Cerutti/Extender: Edmonia Lynch in Treatment: 42 Active Inactive Nutrition Nursing Diagnoses: Impaired glucose control: actual or potential Goals: Patient/caregiver verbalizes understanding of need to maintain therapeutic glucose control per primary care physician Date Initiated: 01/22/2021 Target Resolution Date: 12/15/2021 Goal Status: Active Interventions: Assess HgA1c results as ordered upon admission and as needed Provide education on elevated blood sugars and impact on wound healing Treatment Activities: Obtain HgA1c : 01/22/2021 Notes: 07/10/21: Glucose control ongoing 09/11/21 : Glucose control ongoing, patient not compliant in checking glucose. Wound/Skin Impairment Nursing Diagnoses: Impaired tissue integrity GoalsAIYONNA, LUCADO ([id number])  [phone] Page 6 of 9 Patient/caregiver will verbalize understanding of skin care regimen Date Initiated: 01/22/2021 Target Resolution Date: 12/15/2021 Goal Status: Active Ulcer/skin breakdown will have a volume reduction of 30% by week 4 Date Initiated: 01/22/2021 Date Inactivated: 03/19/2021 Target Resolution Date: 03/16/2021 Goal Status: Met Ulcer/skin breakdown will have a volume reduction of 50% by week 8 Date Initiated: 03/19/2021 Date Inactivated: 05/14/2021 Target Resolution Date: 04/16/2021 Unmet Reason: see wound Goal Status: Unmet measurements Interventions: Assess patient/caregiver ability to obtain necessary supplies Assess patient/caregiver ability to perform ulcer/skin care regimen upon admission and as needed Assess ulceration(s) every visit Provide education on ulcer and skin care Treatment Activities: Topical wound management initiated : 01/22/2021 Notes: 06/04/21: Wound vac started 07/10/21: Wound care regimen continues Electronic Signature(s) Signed: 01/16/2022 5:36:43 PM By: Rhae Hammock RN Entered By: Rhae Hammock on 11/12/2021 15:29:36 -------------------------------------------------------------------------------- Pain Assessment Details Patient Name: Date of Service: [name] [name], [name]. 11/12/2021 2:45 PM Medical Record Number: [id number] Patient Account Number: [id number] Date of  Birth/Sex: Treating RN: 09-20-1980 (42 y.o. [name] Primary Care Craigory Toste: Juluis Mire Other Clinician: Referring Mauria Asquith: Treating Namira Rosekrans/Extender: Edmonia Lynch in Treatment: 42 Active Problems Location of Pain Severity and Description of Pain Patient Has Paino Yes Site Locations Pain Location: Pain in Ulcers Rate the pain. Current Pain Level: 3 Pain Management and Medication Current Pain Management: Medication: No Cold Application: No Rest: No Massage: No Activity: No T.E.N.S.: No Heat  Application: No Leg drop or elevation: No [name] the Current Pain Management Adequate: Adequate Blowers, Ziyana [name] ([id number]) [id number] Page 7 of 9 How does your wound impact your activities of daily livingo Sleep: No Bathing: No Appetite: No Relationship With Others: No Bladder Continence: No Emotions: No Bowel Continence: No Work: No Toileting: No Drive: No Dressing: No Hobbies: No Engineer, maintenance) Signed: 11/12/2021 4:53:44 PM By: [name] RN, BSN Entered By: [name] on 11/12/2021 15:05:40 -------------------------------------------------------------------------------- Patient/Caregiver Education Details Patient Name: Date of Service: [name] [name], [name]. 10/30/2023andnbsp2:45 PM Medical Record Number: [id number] Patient Account Number: [id number] Date of Birth/Gender: Treating RN: 1980/02/05 (42 y.o. [name] Primary Care Physician: Juluis Mire Other Clinician: Referring Physician: Treating Physician/Extender: Edmonia Lynch in Treatment: 44 Education Assessment Education Provided To: Patient Education Topics Provided Elevated Blood Sugar/ Impact on Healing: Methods: Explain/Verbal Responses: State content correctly Electronic Signature(s) Signed: 01/16/2022 5:36:43 PM By: Rhae Hammock RN Entered By: Rhae Hammock on 11/12/2021 15:32:09 -------------------------------------------------------------------------------- Wound Assessment Details Patient Name: Date of Service: [name] [name], [name]. 11/12/2021 2:45 PM Medical Record Number: [id number] Patient Account Number: [id number] Date of Birth/Sex: Treating RN: [birth year] (42 y.o. [name] Primary Care Zeppelin Commisso: Juluis Mire Other Clinician: Referring Raquell Richer: Treating Avyukt Cimo/Extender: Geryl Councilman Weeks in Treatment: 42 Wound Status Wound Number: 2 Primary Etiology: Dehisced Wound Wound  Location: Right, Medial Amputation Site - Transmetatarsal Wound Status: Open Wounding Event: Surgical Injury Comorbid Hypertension, Type II Diabetes, Osteomyelitis, History: Neuropathy Date Acquired: 07/10/2021 Weeks Of Treatment: 17 Clustered Wound: No Photos VIVIANNA, PICCINI [name] ([id number]) [id number] Page 8 of 9 Wound Measurements Length: (cm) Width: (cm) Depth: (cm) Area: (cm) Volume: (cm) 0.4 % Reduction in Area: -326.3% 1.6 % Reduction in Volume: -1765.7% 1.3 Epithelialization: None 0.503 Undermining: Yes 0.653 Starting Position (o'clock): 9 Ending Position (o'clock): 3 Maximum Distance: (cm) 1.2 Wound Description Classification: Full Thickness Without Exposed Supp Wound Margin: Distinct, outline attached Exudate Amount: Medium Exudate Type: Serosanguineous Exudate Color: red, brown ort Structures Foul Odor After Cleansing: No Slough/Fibrino Yes Wound Bed Granulation Amount: Large (67-100%) Exposed Structure Granulation Quality: Pink, Pale Fascia Exposed: No Necrotic Amount: Small (1-33%) Fat Layer (Subcutaneous Tissue) Exposed: Yes Necrotic Quality: Adherent Slough Tendon Exposed: No Muscle Exposed: No Joint Exposed: No Bone Exposed: No Periwound Skin Texture Texture Color No Abnormalities Noted: No No Abnormalities Noted: Yes Callus: Yes Temperature / Pain Crepitus: No Temperature: No Abnormality Excoriation: No Induration: No Rash: No Scarring: No Moisture No Abnormalities Noted: No Dry / Scaly: No Maceration: No Assessment Notes per patient surgery while inpatient to clean out the wound. Electronic Signature(s) Signed: 11/12/2021 4:53:44 PM By: [name] RN, BSN Entered By: [name] on 11/12/2021 15:14:02 -------------------------------------------------------------------------------- Vitals Details Patient Name: Date of Service: [name], [name]. 11/12/2021 2:45 PM Medical Record Number: [id number] Patient  Account Number: [id number] Date of Birth/Sex: Treating RN: 07/24/1980 (42 y.o. [name] Primary Care Suly Vukelich: Juluis Mire Other Clinician: GLEE, [name] ([id number]) [id number] Page 9 of 9  Referring Exie Chrismer: Treating Makenly Larabee/Extender: Edmonia Lynch in Treatment: 42 Vital Signs Time Taken: 15:05 Temperature (F): 98.5 Height (in): 69 Pulse (bpm): 93 Respiratory Rate (breaths/min): 20 Blood Pressure (mmHg): 116/77 Capillary Blood Glucose (mg/dl): 117 Reference Range: 80 - 120 mg / dl Electronic Signature(s) Signed: 11/12/2021 4:53:44 PM By: [name] RN, BSN Entered By: [name] on 11/12/2021 15:05:30

## 2022-01-17 NOTE — Progress Notes (Signed)
[name], [name] ([id number]) [id number] Page 1 of 8 Visit Report for 01/15/2022 Arrival Information Details Patient Name: Date of Service: [name], [name] [name]. 01/15/2022 2:45 PM Medical Record Number: [id number] Patient Account Number: [id number] Date of Birth/Sex: Treating RN: 1980-10-11 (42 y.o. [name], [name] Primary Care [name]: [name] Other Clinician: Referring [name]: Treating [name]: [name] in Treatment: 68 Visit Information History Since Last Visit Added or deleted any medications: No Patient Arrived: Wheel Chair Any new allergies or adverse reactions: No Arrival Time: 14:53 Had a fall or experienced change in No Accompanied By: self activities of daily living that may affect Transfer Assistance: None risk of falls: Patient Identification Verified: Yes Signs or symptoms of abuse/neglect since last visito No Secondary Verification Process Completed: Yes Hospitalized since last visit: No Patient Requires Transmission-Based No Implantable device outside of the clinic excluding No Precautions: cellular tissue based products placed in the center Patient Has Alerts: Yes since last visit: Patient Alerts: Patient on Blood Thinner Has Dressing in Place as Prescribed: Yes PICC R Arm Pain Present Now: No ABI 12/17/20 R=1.08 [name]=1.13 Electronic Signature(s) Signed: 01/16/2022 5:35:13 PM By: Rhae Hammock RN Entered By: Rhae Hammock on 01/15/2022 14:58:37 -------------------------------------------------------------------------------- Clinic Level of Care Assessment Details Patient Name: Date of Service: [name] V IS, MA [name]. 01/15/2022 2:45 PM Medical Record Number: [id number] Patient Account Number: [id number] Date of Birth/Sex: Treating RN: [birth year] (42 y.o. [name], [name] Primary Care [name]: [name] Other Clinician: Referring [name]: Treating [name]:  [name] in Treatment: 51 Clinic Level of Care Assessment Items TOOL 4 Quantity Score X- 1 0 Use when only an EandM [name] ASSESSMENTS - Nursing Assessment / Reassessment X- 1 10 Reassessment of Co-morbidities (includes updates in patient status) X- 1 5 Reassessment of Adherence to Treatment Plan ASSESSMENTS - Wound and Skin A ssessment / Reassessment X - Simple Wound Assessment / Reassessment - one wound 1 5 _0  - 0 Complex Wound Assessment / Reassessment - multiple wounds _1  - 0 Dermatologic / Skin Assessment (not related to wound area) ASSESSMENTS - Focused Assessment X- 1 5 Circumferential Edema Measurements - multi extremities _2  - 0 Nutritional Assessment / Counseling / Intervention [name], [name] ([id number]) [id number] Page 2 of 8 _3  - 0 Lower Extremity Assessment (monofilament, tuning fork, pulses) _4  - 0 Peripheral Arterial Disease Assessment (using hand held doppler) ASSESSMENTS - Ostomy and/or Continence Assessment and Care _5  - 0 Incontinence Assessment and Management _6  - 0 Ostomy Care Assessment and Management (repouching, etc.) PROCESS - Coordination of Care X - Simple Patient / Family Education for ongoing care 1 15 _7  - 0 Complex (extensive) Patient / Family Education for ongoing care X- 1 10 Staff obtains Programmer, systems, Records, T Results / Process Orders est _8  - 0 Staff telephones HHA, Nursing Homes / Clarify orders / etc _9  - 0 Routine Transfer to another Facility (non-emergent condition) _10  - 0 Routine Hospital Admission (non-emergent condition) _11  - 0 New Admissions / Biomedical engineer / Ordering NPWT Apligraf, etc. , _12  - 0 Emergency Hospital Admission (emergent condition) X- 1 10 Simple Discharge Coordination _13  - 0 Complex (extensive) Discharge Coordination PROCESS - Special Needs _14  - 0 Pediatric / Minor Patient Management _15  - 0 Isolation Patient  Management _16  - 0 Hearing / Language / Visual special needs _17  - 0 Assessment of Community assistance (transportation, D/C planning, etc.) _18  - 0 Additional assistance / Altered mentation _19  - 0 Support  Surface(s) Assessment (bed, cushion, seat, etc.) INTERVENTIONS - Wound Cleansing / Measurement X - Simple Wound Cleansing - one wound 1 5 _0  - 0 Complex Wound Cleansing - multiple wounds X- 1 5 Wound Imaging (photographs - any number of wounds) _1  - 0 Wound Tracing (instead of photographs) X- 1 5 Simple Wound Measurement - one wound _2  - 0 Complex Wound Measurement - multiple wounds INTERVENTIONS - Wound Dressings X - Small Wound Dressing one or multiple wounds 1 10 _3  - 0 Medium Wound Dressing one or multiple wounds _4  - 0 Large Wound Dressing one or multiple wounds <VWUJWJXBJYNWGNFA>_2<\/ZHYQMVHQIONGEXBM>_8  - 0 Application of Medications - topical <UXLKGMWNUUVOZDGU>_4<\/QIHKVQQVZDGLOVFI>_4  - 0 Application of Medications - injection INTERVENTIONS - Miscellaneous _7  - 0 External ear exam _8  - 0 Specimen Collection (cultures, biopsies, blood, body fluids, etc.) _9  - 0 Specimen(s) / Culture(s) sent or taken to Lab for analysis _10  - 0 Patient Transfer (multiple staff / Civil Service fast streamer / Similar devices) _11  - 0 Simple Staple / Suture removal (25 or less) _12  - 0 Complex Staple / Suture removal (26 or more) _13  - 0 Hypo / Hyperglycemic Management (close monitor of Blood Glucose) Shahin, [name] ([id number]) [id number] Page 3 of 8 _14  - 0 Ankle / Brachial Index (ABI) - do not check if billed separately X- 1 5 Vital Signs Has the patient been seen at the hospital within the last three years: Yes Total Score: 90 Level Of Care: New/Established - Level 3 Electronic Signature(s) Signed: 01/16/2022 5:35:13 PM By: Rhae Hammock RN Entered By: Rhae Hammock on 01/15/2022 15:14:05 -------------------------------------------------------------------------------- Lower Extremity Assessment Details Patient Name: Date of Service: [name]  V IS, MA [name]. 01/15/2022 2:45 PM Medical Record Number: [id number] Patient Account Number: [id number] Date of Birth/Sex: Treating RN: 18-Jun-1980 (42 y.o. [name], [name] Primary Care Fanny Agan: [name] Other Clinician: Referring Abdallah Hern: Treating [name]/Extender: [name] in Treatment: 51 Edema Assessment Assessed: [Left: No] Patrice Paradise: Yes] Edema: [Left: Ye] [Right: s] Calf Left: Right: Point of Measurement: 34 cm From Medial Instep 48.5 cm Ankle Left: Right: Point of Measurement: 8 cm From Medial Instep 27.5 cm Vascular Assessment Pulses: Dorsalis Pedis Palpable: [Right:Yes] Posterior Tibial Palpable: [Right:Yes] Electronic Signature(s) Signed: 01/16/2022 5:35:13 PM By: Rhae Hammock RN Entered By: Rhae Hammock on 01/15/2022 14:59:27 -------------------------------------------------------------------------------- Multi Wound Chart Details Patient Name: Date of Service: [name] V IS, MA [name]. 01/15/2022 2:45 PM Medical Record Number: [id number] Patient Account Number: [id number] Date of Birth/Sex: Treating RN: [birth year] (42 y.o. F) Primary Care Devlyn Parish: [name] Other Clinician: Referring Ji Feldner: Treating Zniya Cottone/Extender: [name] in Treatment: 51 Vital Signs Height(in): 69 Pulse(bpm): 74 Weight(lbs): Blood Pressure(mmHg): Pulse, Dashonna [name] ([id number]) [id number] Page 4 of 8 Body Mass Index(BMI): Temperature(F): 98.7 Respiratory Rate(breaths/min): 17 [2:Photos: No Photos Right, Medial Amputation Site - Wound Location: Transmetatarsal Surgical Injury Wounding Event: Diabetic Wound/Ulcer of the Lower Primary Etiology: Extremity Open Surgical Wound Secondary Etiology: Hypertension, Type II Diabetes, Comorbid  History: Osteomyelitis, Neuropathy 07/10/2021 Date Acquired: 27 Weeks of Treatment: Open Wound Status: No Wound Recurrence: 0.1x0.1x0.1  Measurements [name] x W x D (cm) 0.008 A (cm) : rea 0.001 Volume (cm) : 93.20% % Reduction in A rea: 97.10% % Reduction in  Volume: Grade 3 Classification: Medium Exudate A mount: Serosanguineous Exudate Type: red, brown Exudate Color: Distinct, outline attached Wound Margin: Large (67-100%) Granulation A mount: Red, Pink Granulation Quality: None Present (0%) Necrotic A  mount: Fascia: No Exposed Structures: Fat Layer (Subcutaneous Tissue): No Tendon: No  Muscle: No Joint: No Bone: No Small (1-33%) Epithelialization: Excoriation: No Periwound Skin Texture: Induration: No Callus: No Crepitus: No Rash: No Scarring: No  Maceration: No Periwound Skin Moisture: Dry/Scaly: No Atrophie Blanche: No Periwound Skin Color: Cyanosis: No Ecchymosis: No Erythema: No Hemosiderin Staining: No Mottled: No Pallor: No Rubor: No No Abnormality Temperature: Yes Tenderness on Palpation:]  [N/A:N/A N/A N/A N/A N/A N/A N/A N/A N/A N/A N/A N/A N/A N/A N/A N/A N/A N/A N/A N/A N/A N/A N/A N/A N/A N/A N/A N/A N/A N/A] Treatment Notes Electronic Signature(s) Signed: 01/15/2022 3:41:44 PM By: Kalman Shan DO Entered By: Kalman Shan on 01/15/2022 15:26:48 -------------------------------------------------------------------------------- Multi-Disciplinary Care Plan Details Patient Name: Date of Service: [name] V IS, MA [name]. 01/15/2022 2:45 PM Medical Record Number: [id number] Patient Account Number: [id number] Date of Birth/Sex: Treating RN: 06-07-1980 (42 y.o. [name], [name] Primary Care Allie Ousley: [name] Other Clinician: Referring Tiombe Tomeo: Treating Falana Clagg/Extender: [name] in Treatment: [address], [place] ([id number]) [id number] Page 5 of 8 Active Inactive Nutrition Nursing Diagnoses: Impaired glucose control: actual or potential Goals: Patient/caregiver verbalizes understanding of need to maintain therapeutic glucose control per primary care  physician Date Initiated: 01/22/2021 Target Resolution Date: 01/12/2022 Goal Status: Active Interventions: Assess HgA1c results as ordered upon admission and as needed Provide education on elevated blood sugars and impact on wound healing Treatment Activities: Obtain HgA1c : 01/22/2021 Notes: 07/10/21: Glucose control ongoing 09/11/21 : Glucose control ongoing, patient not compliant in checking glucose. Wound/Skin Impairment Nursing Diagnoses: Impaired tissue integrity Goals: Patient/caregiver will verbalize understanding of skin care regimen Date Initiated: 01/22/2021 Target Resolution Date: 01/12/2022 Goal Status: Active Ulcer/skin breakdown will have a volume reduction of 30% by week 4 Date Initiated: 01/22/2021 Date Inactivated: 03/19/2021 Target Resolution Date: 03/16/2021 Goal Status: Met Ulcer/skin breakdown will have a volume reduction of 50% by week 8 Date Initiated: 03/19/2021 Date Inactivated: 05/14/2021 Target Resolution Date: 04/16/2021 Unmet Reason: see wound Goal Status: Unmet measurements Interventions: Assess patient/caregiver ability to obtain necessary supplies Assess patient/caregiver ability to perform ulcer/skin care regimen upon admission and as needed Assess ulceration(s) every visit Provide education on ulcer and skin care Treatment Activities: Topical wound management initiated : 01/22/2021 Notes: 06/04/21: Wound vac started 07/10/21: Wound care regimen continues Electronic Signature(s) Signed: 01/16/2022 5:35:13 PM By: Rhae Hammock RN Entered By: Rhae Hammock on 01/15/2022 15:09:16 -------------------------------------------------------------------------------- Pain Assessment Details Patient Name: Date of Service: [name] [name], MA [name]. 01/15/2022 2:45 PM Medical Record Number: [id number] Patient Account Number: [id number] Date of Birth/Sex: Treating RN: 17-Mar-1980 (42 y.o. [name], [name] Primary Care Logen Fowle: [name] Other Clinician: Referring  Britne Borelli: Treating Nikira Kushnir/Extender: [name] in Treatment: [address] [place], Shanda Bumps ([id number]) [id number] Page 6 of 8 Location of Pain Severity and Description of Pain Patient Has Paino No Site Locations Pain Management and Medication Current Pain Management: Electronic Signature(s) Signed: 01/16/2022 5:35:13 PM By: Rhae Hammock RN Entered By: Rhae Hammock on 01/15/2022 14:58:52 -------------------------------------------------------------------------------- Patient/Caregiver Education Details Patient Name: Date of Service: [name] [name], MA [place] 1/2/2024andnbsp2:45 PM Medical Record Number: [id number] Patient Account Number: [id number] Date of Birth/Gender: Treating RN: Dec 27, 1980 (42 y.o. Benjaman Lobe Primary Care Physician: [name] Other Clinician: Referring Physician: Treating Physician/Extender: [name] in Treatment: 33 Education Assessment Education Provided To: Patient Education Topics Provided Wound/Skin Impairment: Methods: Explain/Verbal Responses: Reinforcements needed, State content correctly [employer]) Signed: 01/16/2022 5:35:13 PM By: Rhae Hammock RN Entered By: Rhae Hammock on  01/15/2022 15:13:39 -------------------------------------------------------------------------------- Wound Assessment Details Patient Name: Date of Service: [name] V IS, MA [name]. 01/15/2022 2:45 PM Henard, Shanda Bumps ([id number]) [id number] Page 7 of 8 Medical Record Number: [id number] Patient Account Number: [id number] Date of Birth/Sex: Treating RN: January 19, 1980 (42 y.o. 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, Medial Amputation Site - Transmetatarsal Secondary Open Surgical Wound Etiology: Wounding Event: Surgical Injury Wound Status: Open Date Acquired: 07/10/2021 Comorbid History: Hypertension, Type II Diabetes, Osteomyelitis, Weeks Of Treatment: 27 Neuropathy Clustered Wound: No Wound Measurements Length: (cm) 0.1 Width: (cm) 0.1 Depth: (cm) 0.1 Area: (cm) 0.008 Volume: (cm) 0.001 % Reduction in Area: 93.2% % Reduction in Volume: 97.1% Epithelialization: Small (1-33%) Tunneling: No Undermining: No Wound Description Classification: Wound Margin: Exudate Amount: Exudate Type: Exudate Color: Grade 3 Distinct, outline attached Medium Serosanguineous red, brown Wound Bed Granulation Amount: Large (67-100%) Exposed Structure Granulation Quality: Red, Pink Fascia Exposed: No Necrotic Amount: None Present (0%) Fat Layer (Subcutaneous Tissue) Exposed: No Tendon Exposed: No Muscle Exposed: No Joint Exposed: No Bone Exposed: No Periwound Skin Texture Texture Color No Abnormalities Noted: No No Abnormalities Noted: No Callus: No Atrophie Blanche: No Crepitus: No Cyanosis: No Excoriation: No Ecchymosis: No Induration: No Erythema: No Rash: No Hemosiderin Staining: No Scarring: No Mottled: No Pallor: No Moisture Rubor: No No Abnormalities Noted: No Dry / Scaly: No Temperature / Pain Maceration: No Temperature: No Abnormality Tenderness on Palpation: Yes Electronic Signature(s) Signed: 01/16/2022 5:35:13 PM By: Rhae Hammock RN Entered By: Rhae Hammock on 01/15/2022 15:02:54 -------------------------------------------------------------------------------- Vitals Details Patient Name: Date of Service: [name] V IS, MA [name]. 01/15/2022 2:45 PM Medical Record Number: [id number] Patient Account Number: [id number] Date of Birth/Sex: Treating RN: October 10, 1980 (42 y.o. [name], [name] Primary Care  Seiya Silsby: [name] Other Clinician: Referring Evadne Ose: Treating Jette Lewan/Extender: [name] in Treatment: [address] [place] ([id number]) [id number] Page 8 of 8 Vital Signs Time Taken: 14:58 Temperature (F): 98.7 Height (in): 69 Pulse (bpm): 74 Respiratory Rate (breaths/min): 17 Reference Range: 80 - 120 mg / dl Electronic Signature(s) Signed: 01/16/2022 5:35:13 PM By: Rhae Hammock RN Entered By: Rhae Hammock on 01/15/2022 14:58:48

## 2022-01-18 ENCOUNTER — Telehealth (INDEPENDENT_AMBULATORY_CARE_PROVIDER_SITE_OTHER): Payer: Medicaid Other | Admitting: Primary Care

## 2022-01-21 ENCOUNTER — Ambulatory Visit (HOSPITAL_BASED_OUTPATIENT_CLINIC_OR_DEPARTMENT_OTHER): Payer: Medicaid Other | Admitting: Internal Medicine

## 2022-01-22 ENCOUNTER — Ambulatory Visit: Payer: Medicaid Other | Admitting: Internal Medicine

## 2022-01-22 ENCOUNTER — Telehealth (INDEPENDENT_AMBULATORY_CARE_PROVIDER_SITE_OTHER): Payer: Medicaid Other | Admitting: Primary Care

## 2022-01-24 ENCOUNTER — Other Ambulatory Visit (HOSPITAL_COMMUNITY): Payer: Self-pay

## 2022-01-29 ENCOUNTER — Ambulatory Visit (INDEPENDENT_AMBULATORY_CARE_PROVIDER_SITE_OTHER): Payer: Medicaid Other | Admitting: Primary Care

## 2022-01-29 ENCOUNTER — Ambulatory Visit (HOSPITAL_BASED_OUTPATIENT_CLINIC_OR_DEPARTMENT_OTHER): Payer: Medicaid Other | Admitting: Internal Medicine

## 2022-01-30 ENCOUNTER — Ambulatory Visit (INDEPENDENT_AMBULATORY_CARE_PROVIDER_SITE_OTHER): Payer: Medicaid Other | Admitting: Internal Medicine

## 2022-01-30 ENCOUNTER — Other Ambulatory Visit: Payer: Self-pay

## 2022-01-30 ENCOUNTER — Encounter: Payer: Self-pay | Admitting: Internal Medicine

## 2022-01-30 VITALS — BP 123/77 | HR 81 | Temp 97.6°F | Ht 69.0 in | Wt 330.0 lb

## 2022-01-30 DIAGNOSIS — Z5181 Encounter for therapeutic drug level monitoring: Secondary | ICD-10-CM

## 2022-01-30 DIAGNOSIS — A429 Actinomycosis, unspecified: Secondary | ICD-10-CM

## 2022-01-30 LAB — CBC WITH DIFFERENTIAL/PLATELET
Absolute Monocytes: 471 cells/uL (ref 200–950)
Basophils Absolute: 23 cells/uL (ref 0–200)
Basophils Relative: 0.3 %
Eosinophils Absolute: 61 cells/uL (ref 15–500)
Eosinophils Relative: 0.8 %
HCT: 36.8 % (ref 35.0–45.0)
Hemoglobin: 12 g/dL (ref 11.7–15.5)
Lymphs Abs: 1649 cells/uL (ref 850–3900)
MCH: 26.5 pg — ABNORMAL LOW (ref 27.0–33.0)
MCHC: 32.6 g/dL (ref 32.0–36.0)
MCV: 81.4 fL (ref 80.0–100.0)
MPV: 11.6 fL (ref 7.5–12.5)
Monocytes Relative: 6.2 %
Neutro Abs: 5396 cells/uL (ref 1500–7800)
Neutrophils Relative %: 71 %
Platelets: 281 10*3/uL (ref 140–400)
RBC: 4.52 10*6/uL (ref 3.80–5.10)
RDW: 14 % (ref 11.0–15.0)
Total Lymphocyte: 21.7 %
WBC: 7.6 10*3/uL (ref 3.8–10.8)

## 2022-01-30 LAB — COMPREHENSIVE METABOLIC PANEL
AG Ratio: 1.2 (calc) (ref 1.0–2.5)
ALT: 4 U/L — ABNORMAL LOW (ref 6–29)
AST: 9 U/L — ABNORMAL LOW (ref 10–30)
Albumin: 3.6 g/dL (ref 3.6–5.1)
Alkaline phosphatase (APISO): 91 U/L (ref 31–125)
BUN: 11 mg/dL (ref 7–25)
CO2: 25 mmol/L (ref 20–32)
Calcium: 9.1 mg/dL (ref 8.6–10.2)
Chloride: 100 mmol/L (ref 98–110)
Creat: 0.79 mg/dL (ref 0.50–0.99)
Globulin: 3 g/dL (calc) (ref 1.9–3.7)
Glucose, Bld: 225 mg/dL — ABNORMAL HIGH (ref 65–99)
Potassium: 4 mmol/L (ref 3.5–5.3)
Sodium: 135 mmol/L (ref 135–146)
Total Bilirubin: 0.3 mg/dL (ref 0.2–1.2)
Total Protein: 6.6 g/dL (ref 6.1–8.1)

## 2022-01-30 MED ORDER — AMOXICILLIN 500 MG PO CAPS
500.0000 mg | ORAL_CAPSULE | Freq: Three times a day (TID) | ORAL | 2 refills | Status: DC
  Filled 2022-01-30: qty 90, 30d supply, fill #0
  Filled 2022-06-26: qty 90, 30d supply, fill #1

## 2022-01-30 NOTE — Progress Notes (Signed)
   Subjective:    Patient ID: [name], female    DOB: Jan 16, 1980, 42 y.o.   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  Her foot is healing well, no pus and followed by wound care.  No associated rash or diarrhea.   No new issues but does miss occasional doses.  No concerns otherwise.    Review of Systems  Constitutional:  Negative for chills and fever.  Gastrointestinal:  Negative for diarrhea.  Skin:  Negative for rash.       Objective:   Physical Exam Eyes:     General: No scleral icterus. Pulmonary:     Effort: Pulmonary effort is normal.  Neurological:     General: No focal deficit present.     Mental Status: She is alert.   SH: no tobacco        Assessment & Plan:

## 2022-01-30 NOTE — Assessment & Plan Note (Signed)
Will monitor her labs to assure no side effects

## 2022-01-30 NOTE — Assessment & Plan Note (Signed)
She continues on amoxicillin and doing well.  No concerns and plan to continue for 3 more months until her next visit then stop. Follow up in 3 months.

## 2022-02-04 ENCOUNTER — Encounter (HOSPITAL_BASED_OUTPATIENT_CLINIC_OR_DEPARTMENT_OTHER): Payer: Medicaid Other | Admitting: Internal Medicine

## 2022-02-04 ENCOUNTER — Other Ambulatory Visit: Payer: Self-pay

## 2022-02-04 DIAGNOSIS — M869 Osteomyelitis, unspecified: Secondary | ICD-10-CM

## 2022-02-04 DIAGNOSIS — L97514 Non-pressure chronic ulcer of other part of right foot with necrosis of bone: Secondary | ICD-10-CM

## 2022-02-04 DIAGNOSIS — E11621 Type 2 diabetes mellitus with foot ulcer: Secondary | ICD-10-CM | POA: Diagnosis not present

## 2022-02-05 NOTE — Progress Notes (Signed)
AMIRE, GOSSEN ([id number]) [id number] Page 1 of 8 Visit Report for 02/04/2022 Chief Complaint Document Details Patient Name: Date of Service: [name], [name]. 02/04/2022 1:15 PM Medical Record Number: [id number] Patient Account Number: [id number] Date of Birth/Sex: Treating RN: [birth year] (42 y.o. F) Primary Care Provider: Juluis Mire Other Clinician: Referring Provider: Treating Provider/Extender: Edmonia Lynch in Treatment: 6 Information Obtained from: Patient Chief Complaint Osteomyelitis of the right foot status post transmetatarsal amputation with surgical site dehiscence Electronic Signature(s) Signed: 02/05/2022 10:49:17 AM By: Kalman Shan DO Entered By: Kalman Shan on 02/04/2022 13:52:31 -------------------------------------------------------------------------------- HPI Details Patient Name: Date of Service: DA [name], [name]. 02/04/2022 1:15 PM Medical Record Number: [id number] Patient Account Number: [id number] Date of Birth/Sex: Treating RN: August 30, 1980 (42 y.o. F) Primary Care Provider: Juluis Mire Other Clinician: Referring Provider: Treating Provider/Extender: Edmonia Lynch in Treatment: 52 History of Present Illness HPI Description: Admission 01/22/2021 [name] is a 42 year old female with a past medical history of insulin-dependent uncontrolled type 2 diabetes with last hemoglobin A1c of 13.5, osteomyelitis of the right foot status post transmetatarsal amputation on 12/18/2020 that presents to the clinic for right foot wound. She has had dehiscence of the surgical site. She is currently using wet-to-dry dressings. She has a PICC line and receiving IV ceftriaxone daily for her osteomyelitis. There is an end date of 01/27/2021. She is also taking oral metronidazole. She currently denies systemic signs of infection. 1/19; patient presents for follow-up. She was  diagnosed with a DVT to the right lower extremity 2 days ago. She is on Eliquis now. She is scheduled to see her infectious disease doctor tomorrow. She has been using Dakin's wet-to-dry dressings. She denies systemic signs of infection. 1/26; patient presents for follow-up. She saw infectious disease on 1/21 started on Augmentin. Her PICC line and IV ceftriaxone was discontinued. Patient reports stability to her wound. She has been using Dakin's wet-to-dry dressings. She currently denies systemic signs of infection. 2/3; patient presents for follow-up. She continues to use Dakin's wet-to-dry dressings to the wound bed. She saw Dr. Yvette Rack with infectious disease yesterday and is continuing Augmentin. T entative end date is 2/16. Patient reports following up with orthopedics. She states there is no further plan from them. She currently denies systemic signs of infection. 2/10; patient presents for follow-up. She continues to use Dakin's wet to dry dressings. She is scheduled to have her MRI done on 2/14. She states that she had pain to the debridement site from last clinic visit and declines debridement today. She denies systemic signs of infection. She continues to have yellow thick drainage. 2/20; patient presents for follow-up. She continues to use Dakin's wet-to-dry dressings. She obtained her MRI. The results showed an abscess and she is scheduled to see her orthopedic surgeon on 2/23. She saw infectious disease 2/17 and her antibiotics were extended. She currently denies systemic signs of infection. 3/6; patient presents for follow-up. She had debridement and irrigation of her foot on 03/10/2021 due to abscess noted on MRI. She was started on IV ceftriaxone and oral Flagyl. She has no issues or complaints today. She has been using iodoform packing to the tunnel and Dakin's wet-to-dry to the opening. 03/26/2021: She continues on IV ceftriaxone and oral metronidazole. She has follow-up with  infectious disease tomorrow. No significant issues or complaints today. Her mother continues to help her with her wound dressing, using iodoform packing strips into the tunnel and Dakin's to  the open portion of the wound. 3/20; patient presents for follow-up. She continues to be on IV ceftriaxone in oral metronidazole. She has been using iodoform to the tunnel and Dakin's wet-toKAHLAN, [name] ([id number]) [id number] Page 2 of 8 dry to the open wound. She denies signs of infection. 3/27; patient presents for follow-up. She no longer has a PICC line. She has been using iodoform to the tunnel and Dakin's wet-to-dry to the open wound. She reports improvement in wound healing. She denies signs of infection. 4/3; patient presents for follow-up. She states she has been using Hydrofera Blue to the open wound and iodoform packing to the tunnel without any issues. She denies signs of infection. 4/18; patient presents for follow-up. She saw infectious disease on 4/11. She has finished her oral antibiotics and completed a total of 6 weeks of antibiotics (this includes IV as well). No further antibiotics needed. She has been using Hydrofera Blue and iodoform packing. She states that the tunneled area has come in and the iodoform is not staying in place anymore. She has no issues or complaints today. She denies signs of infection. 4/24; patient presents for follow-up. She saw Dr. Carlene Coria, plastic surgery to discuss potential skin graft/substitute placement. At this time he thinks that the skin graft would likely not take. He is in agreement with trying a wound VAC. Patient has been using Hydrofera Blue dressing changes with no issues. She denies signs of infection. She reports improvement in wound healing. 5/1; patient presents for follow-up. Unfortunately patient did not have insurance when we ran for the pico. There is an assistance program and we are trying to get this  accommodated for the patient. In the meantime she has been using Hydrofera Blue without any issues. She denies signs of infection. 5/8; patient presents for follow-up. We have not heard back if pico is covered by her insurance. She has been using collagen to the wound bed over the past week. She denies signs of infection. 5/18; patient presents for follow-up. She has been using collagen to the wound bed without issues. Again we have not heard if pico is covered by her insurance. She has no issues or complaints today. 5/23; patient presents for follow-up. She has been using collagen to the wound bed. She has no issues or complaints today. She obtained the wound VAC from [facility] and brought this in today. She denies signs of infection. 6/1; patient presents for follow-up. She has been using the wound VAC for the past week. She has had this changed twice since she was last here. She reports more maceration to the periwound. She denies signs of infection. 6/7; right TMA site. There are 2 wounds 0 separated by a bridge of healed tissue. The more lateral area has undermining. Both areas have healthy looking granulation at the base but relative the size of the wound is fairly deep. There is no exposed bone no evidence of infection. Her wound VAC was put on hold last week because of surrounding skin maceration she has been using collagen this week. She has a modified shoe 6/12; patient presents for follow-up. Last week the wound VAC was reinitiated. She had no issues with the wound VAC itself. Today she has maceration again noted to the surrounding skin. She denies signs of infection. 6/27; patient presents for follow-up. She has been using Medihoney to the wound bed. We took a break from the wound VAC because the periwound was macerated. She still has some areas of maceration to  the distal foot where there is a callus. She currently denies signs of infection. 7/11; patient presents for follow-up. She has been  using Medihoney to the wound bed. She has developed some increased warmth and erythema to the lateral aspect of the right foot. She states this is occurred over the past week and there is increased pain. No drainage noted. 7/17; patient presents for follow-up. She has been using Medihoney to the wound bed. She completed her course of Bactrim. She reports improvement in symptoms. 7/24; Patient presents for follow up. She has been using Medihoney to the wound bed without issues. She completed another course of Bactrim. She reports improvement in her symptoms but still has some mild tenderness to the medial aspect of the foot. 7/31; Patient presents for follow-up. She has been using Medihoney and Dakin's to the wound bed. She denies signs of infection. 8/14; patient presents for follow-up. She has been using Dakin's wet-to-dry packing strips to the right medial aspect of the amputation site and Medihoney to the anterior site. She has no issues or complaints today. She has started physical therapy. She denies signs of infection. 8/29; patient presents for follow-up. She has been using Dakin's wet-to-dry packing strips to the right medial aspect of the amputation site however this is becoming more difficult to place. She did report that she had increased redness and swelling to that site and developed some drainage. It has resolved. She continues with physical therapy. 9/8; patient presents for follow-up. We have been using silver alginate to the tunneled area. She completed her course of antibiotics. She reports no pain, increased swelling or erythema. 9/21; patient presents for follow-up. She has been using gentamicin to the tunneled area. She has no issues or complaints today. She denies signs of infection. 10/2; patient presents for follow-up. She is scheduled to have her CT scan on 10/9. She currently denies signs of infection. She denies increased warmth, erythema or purulent drainage from the wound  bed. She has been using Dakin's wet-to-dry dressings. 10/12; patient presents for follow-up. She had her CT scan on 10/9 that showed A small irregular rim-enhancing fluid pocket communicating to the overlying soft tissues of the sinus tract compatible with a small abscess. Currently she denies systemic signs of infection. She has been doing Dakin's wet-to-dry packing strips but it is hard for her to pack into the narrow opening. 10/30; patient presents for follow-up. Since last clinic visit she has had OR debridement of her Right foot by Dr. Odis Hollingsheadamanathan Due to abscess noted on CT. She has been using iodoform packing. 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 She will complete 2 weeks of this and continue with oral amoxicillin for the next 6 to 12 months. She follows with Dr. Luciana Axeomer for this. She currently denies signs of infection. 11/13; patient presents for follow-up. Patient has been using silver alginate with gentamicin to the wound bed. She has no issues or complaints today she. She reports improvement in wound healing. 12/4; patient presents for follow-up. She has been using silver alginate and gentamicin to the wound bed. She has no issues or complaints today. 12/8; patient presents for follow-up. She has been using silver alginate with gentamicin to the wound bed. She presents for her first cast placement. She will be back early next week for her obligatory cast change. 12/12; patient presents for follow-up. We have been using collagen with antibiotic ointment to the wound bed under a total contact cast. She  has tolerated this well. She is improved in wound healing. 12/18; patient presents for follow-up. We have been using collagen with antibiotic ointment under the total contact cast. She has no complaints today. 1/2; The cast was placed at last clinic visit however patient missed her follow-up and this was taken off last week at a nurse visit. Patient has  been using packing strips to the medial narrowed wound. She has noted no drainage. [name], [name] ([id number]) [id number] Page 3 of 8 1/22; patient presents for follow-up. She has been keeping the area covered. She reports no drainage. She has had no issues to the previous wound site. She denies any signs of infection including increased warmth, erythema or purulent drainage. Electronic Signature(s) Signed: 02/05/2022 10:49:17 AM By: Kalman Shan DO Entered By: Kalman Shan on 02/04/2022 13:53:01 -------------------------------------------------------------------------------- Physical Exam Details Patient Name: Date of Service: [name], [name]. 02/04/2022 1:15 PM Medical Record Number: [id number] Patient Account Number: [id number] Date of Birth/Sex: Treating RN: November 07, 1980 (42 y.o. F) Primary Care Provider: Juluis Mire Other Clinician: Referring Provider: Treating Provider/Extender: Edmonia Lynch in Treatment: 56 Constitutional respirations regular, non-labored and within target range for patient.. Cardiovascular 2+ dorsalis pedis/posterior tibialis pulses. Psychiatric pleasant and cooperative. Notes Right foot: T the transmetatarsal amputation site there is a narrowed area to the medial aspect that is epithelialized. Callus to the surrounding area. No o drainage noted. No signs of infection including increased warmth erythema or purulent drainage. Electronic Signature(s) Signed: 02/05/2022 10:49:17 AM By: Kalman Shan DO Entered By: Kalman Shan on 02/04/2022 13:54:41 -------------------------------------------------------------------------------- Physician Orders Details Patient Name: Date of Service: [name], [name]. 02/04/2022 1:15 PM Medical Record Number: [id number] Patient Account Number: [id number] Date of Birth/Sex: Treating RN: 1980-11-17 (42 y.o. [name] Primary Care Provider:  Juluis Mire Other Clinician: Referring Provider: Treating Provider/Extender: Edmonia Lynch in Treatment: 59 Verbal / Phone Orders: No Diagnosis Coding ICD-10 Coding Code Description L97.514 Non-pressure chronic ulcer of other part of right foot with necrosis of bone E11.621 Type 2 diabetes mellitus with foot ulcer M86.9 Osteomyelitis, unspecified Discharge From [facility] Services Discharge from [place] - Call if any future wound care needs. Closely monitor feet daily. Electronic Signature(s) [name], [name] ([id number]) [id number] Page 4 of 8 Signed: 02/05/2022 10:49:17 AM By: Kalman Shan DO Entered By: Kalman Shan on 02/04/2022 13:54:47 -------------------------------------------------------------------------------- Problem List Details Patient Name: Date of Service: [name], [name]. 02/04/2022 1:15 PM Medical Record Number: [id number] Patient Account Number: [id number] Date of Birth/Sex: Treating RN: 1980-07-24 (42 y.o. [name] Primary Care Provider: Juluis Mire Other Clinician: Referring Provider: Treating Provider/Extender: Edmonia Lynch in Treatment: 76 Active Problems ICD-10 Encounter Code Description Active Date MDM Diagnosis L97.514 Non-pressure chronic ulcer of other part of right foot with necrosis of bone 01/22/2021 No Yes E11.621 Type 2 diabetes mellitus with foot ulcer 01/22/2021 No Yes M86.9 Osteomyelitis, unspecified 01/22/2021 No Yes Inactive Problems Resolved Problems Electronic Signature(s) Signed: 02/05/2022 10:49:17 AM By: Kalman Shan DO Entered By: Kalman Shan on 02/04/2022 13:52:11 -------------------------------------------------------------------------------- Progress Note Details Patient Name: Date of Service: [name], [name]. 02/04/2022 1:15 PM Medical Record Number: [id number] Patient Account Number: [id number] Date of  Birth/Sex: Treating RN: 18-Apr-1980 (42 y.o. F) Primary Care Provider: Juluis Mire Other Clinician: Referring Provider: Treating Provider/Extender: Edmonia Lynch in Treatment: 75 Subjective Chief Complaint Information obtained from Patient Osteomyelitis of the right foot status  post transmetatarsal amputation with surgical site dehiscence History of Present Illness (HPI) Admission 01/22/2021 [name] is a 42 year old female with a past medical history of insulin-dependent uncontrolled type 2 diabetes with last hemoglobin A1c of 13.5, osteomyelitis of the right foot status post transmetatarsal amputation on 12/18/2020 that presents to the clinic for right foot wound. She has had dehiscence of the surgical site. She is currently using wet-to-dry dressings. She has a PICC line and receiving IV ceftriaxone daily for her osteomyelitis. There is an end date DEBROAH, SHUTTLEWORTH ([id number]) [id number] Page 5 of 8 of 01/27/2021. She is also taking oral metronidazole. She currently denies systemic signs of infection. 1/19; patient presents for follow-up. She was diagnosed with a DVT to the right lower extremity 2 days ago. She is on Eliquis now. She is scheduled to see her infectious disease doctor tomorrow. She has been using Dakin's wet-to-dry dressings. She denies systemic signs of infection. 1/26; patient presents for follow-up. She saw infectious disease on 1/21 started on Augmentin. Her PICC line and IV ceftriaxone was discontinued. Patient reports stability to her wound. She has been using Dakin's wet-to-dry dressings. She currently denies systemic signs of infection. 2/3; patient presents for follow-up. She continues to use Dakin's wet-to-dry dressings to the wound bed. She saw Dr. Manson Passey with infectious disease yesterday and is continuing Augmentin. T entative end date is 2/16. Patient reports following up with orthopedics. She  states there is no further plan from them. She currently denies systemic signs of infection. 2/10; patient presents for follow-up. She continues to use Dakin's wet to dry dressings. She is scheduled to have her MRI done on 2/14. She states that she had pain to the debridement site from last clinic visit and declines debridement today. She denies systemic signs of infection. She continues to have yellow thick drainage. 2/20; patient presents for follow-up. She continues to use Dakin's wet-to-dry dressings. She obtained her MRI. The results showed an abscess and she is scheduled to see her orthopedic surgeon on 2/23. She saw infectious disease 2/17 and her antibiotics were extended. She currently denies systemic signs of infection. 3/6; patient presents for follow-up. She had debridement and irrigation of her foot on 03/10/2021 due to abscess noted on MRI. She was started on IV ceftriaxone and oral Flagyl. She has no issues or complaints today. She has been using iodoform packing to the tunnel and Dakin's wet-to-dry to the opening. 03/26/2021: She continues on IV ceftriaxone and oral metronidazole. She has follow-up with infectious disease tomorrow. No significant issues or complaints today. Her mother continues to help her with her wound dressing, using iodoform packing strips into the tunnel and Dakin's to the open portion of the wound. 3/20; patient presents for follow-up. She continues to be on IV ceftriaxone in oral metronidazole. She has been using iodoform to the tunnel and Dakin's wet-to- dry to the open wound. She denies signs of infection. 3/27; patient presents for follow-up. She no longer has a PICC line. She has been using iodoform to the tunnel and Dakin's wet-to-dry to the open wound. She reports improvement in wound healing. She denies signs of infection. 4/3; patient presents for follow-up. She states she has been using Hydrofera Blue to the open wound and iodoform packing to the tunnel  without any issues. She denies signs of infection. 4/18; patient presents for follow-up. She saw infectious disease on 4/11. She has finished her oral antibiotics and completed a total of 6 weeks of antibiotics (this includes IV as  well). No further antibiotics needed. She has been using Hydrofera Blue and iodoform packing. She states that the tunneled area has come in and the iodoform is not staying in place anymore. She has no issues or complaints today. She denies signs of infection. 4/24; patient presents for follow-up. She saw Dr. Carlene Coria, plastic surgery to discuss potential skin graft/substitute placement. At this time he thinks that the skin graft would likely not take. He is in agreement with trying a wound VAC. Patient has been using Hydrofera Blue dressing changes with no issues. She denies signs of infection. She reports improvement in wound healing. 5/1; patient presents for follow-up. Unfortunately patient did not have insurance when we ran for the pico. There is an assistance program and we are trying to get this accommodated for the patient. In the meantime she has been using Hydrofera Blue without any issues. She denies signs of infection. 5/8; patient presents for follow-up. We have not heard back if pico is covered by her insurance. She has been using collagen to the wound bed over the past week. She denies signs of infection. 5/18; patient presents for follow-up. She has been using collagen to the wound bed without issues. Again we have not heard if pico is covered by her insurance. She has no issues or complaints today. 5/23; patient presents for follow-up. She has been using collagen to the wound bed. She has no issues or complaints today. She obtained the wound VAC from [facility] and brought this in today. She denies signs of infection. 6/1; patient presents for follow-up. She has been using the wound VAC for the past week. She has had this changed twice since she was last here. She  reports more maceration to the periwound. She denies signs of infection. 6/7; right TMA site. There are 2 wounds 0 separated by a bridge of healed tissue. The more lateral area has undermining. Both areas have healthy looking granulation at the base but relative the size of the wound is fairly deep. There is no exposed bone no evidence of infection. Her wound VAC was put on hold last week because of surrounding skin maceration she has been using collagen this week. She has a modified shoe 6/12; patient presents for follow-up. Last week the wound VAC was reinitiated. She had no issues with the wound VAC itself. Today she has maceration again noted to the surrounding skin. She denies signs of infection. 6/27; patient presents for follow-up. She has been using Medihoney to the wound bed. We took a break from the wound VAC because the periwound was macerated. She still has some areas of maceration to the distal foot where there is a callus. She currently denies signs of infection. 7/11; patient presents for follow-up. She has been using Medihoney to the wound bed. She has developed some increased warmth and erythema to the lateral aspect of the right foot. She states this is occurred over the past week and there is increased pain. No drainage noted. 7/17; patient presents for follow-up. She has been using Medihoney to the wound bed. She completed her course of Bactrim. She reports improvement in symptoms. 7/24; Patient presents for follow up. She has been using Medihoney to the wound bed without issues. She completed another course of Bactrim. She reports improvement in her symptoms but still has some mild tenderness to the medial aspect of the foot. 7/31; Patient presents for follow-up. She has been using Medihoney and Dakin's to the wound bed. She denies signs of infection. 8/14;  patient presents for follow-up. She has been using Dakin's wet-to-dry packing strips to the right medial aspect of the  amputation site and Medihoney to the anterior site. She has no issues or complaints today. She has started physical therapy. She denies signs of infection. 8/29; patient presents for follow-up. She has been using Dakin's wet-to-dry packing strips to the right medial aspect of the amputation site however this is becoming more difficult to place. She did report that she had increased redness and swelling to that site and developed some drainage. It has resolved. She continues with physical therapy. 9/8; patient presents for follow-up. We have been using silver alginate to the tunneled area. She completed her course of antibiotics. She reports no pain, increased swelling or erythema. 9/21; patient presents for follow-up. She has been using gentamicin to the tunneled area. She has no issues or complaints today. She denies signs of infection. [name], [name] ([id number]) [id number] Page 6 of 8 10/2; patient presents for follow-up. She is scheduled to have her CT scan on 10/9. She currently denies signs of infection. She denies increased warmth, erythema or purulent drainage from the wound bed. She has been using Dakin's wet-to-dry dressings. 10/12; patient presents for follow-up. She had her CT scan on 10/9 that showed A small irregular rim-enhancing fluid pocket communicating to the overlying soft tissues of the sinus tract compatible with a small abscess. Currently she denies systemic signs of infection. She has been doing Dakin's wet-to-dry packing strips but it is hard for her to pack into the narrow opening. 10/30; patient presents for follow-up. Since last clinic visit she has had OR debridement of her Right foot by Dr. Odis Hollingsheadamanathan Due to abscess noted on CT. She has been using iodoform packing. 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 She will complete 2 weeks of this and continue with oral amoxicillin for the next 6 to 12 months.  She follows with Dr. Luciana Axeomer for this. She currently denies signs of infection. 11/13; patient presents for follow-up. Patient has been using silver alginate with gentamicin to the wound bed. She has no issues or complaints today she. She reports improvement in wound healing. 12/4; patient presents for follow-up. She has been using silver alginate and gentamicin to the wound bed. She has no issues or complaints today. 12/8; patient presents for follow-up. She has been using silver alginate with gentamicin to the wound bed. She presents for her first cast placement. She will be back early next week for her obligatory cast change. 12/12; patient presents for follow-up. We have been using collagen with antibiotic ointment to the wound bed under a total contact cast. She has tolerated this well. She is improved in wound healing. 12/18; patient presents for follow-up. We have been using collagen with antibiotic ointment under the total contact cast. She has no complaints today. 1/2; The cast was placed at last clinic visit however patient missed her follow-up and this was taken off last week at a nurse visit. Patient has been using packing strips to the medial narrowed wound. She has noted no drainage. 1/22; patient presents for follow-up. She has been keeping the area covered. She reports no drainage. She has had no issues to the previous wound site. She denies any signs of infection including increased warmth, erythema or purulent drainage. Patient History Information obtained from Patient. Family History Cancer - Paternal Grandparents, Diabetes - Mother, Hypertension - Mother, Stroke - Maternal Grandparents, No family history of Heart Disease,  Hereditary Spherocytosis, Kidney Disease, Lung Disease, Seizures, Thyroid Problems, Tuberculosis. Social History Never smoker, Marital Status - Single, Alcohol Use - Rarely, Drug Use - Prior History - Marijuana, Caffeine Use - Daily. Medical  History Cardiovascular Patient has history of Hypertension Endocrine Patient has history of Type II Diabetes Musculoskeletal Patient has history of Osteomyelitis - Right Transmet 12/18/20 Neurologic Patient has history of Neuropathy Hospitalization/Surgery History - inpatient right foot abscess 10/12-10/18/2023. Objective Constitutional respirations regular, non-labored and within target range for patient.. Vitals Time Taken: 1:32 PM, Height: 69 in, Temperature: 98.2 F, Pulse: 87 bpm, Respiratory Rate: 18 breaths/min, Blood Pressure: 107/71 mmHg. Cardiovascular 2+ dorsalis pedis/posterior tibialis pulses. Psychiatric pleasant and cooperative. General Notes: Right foot: T the transmetatarsal amputation site there is a narrowed area to the medial aspect that is epithelialized. Callus to the surrounding o area. No drainage noted. No signs of infection including increased warmth erythema or purulent drainage. Integumentary (Hair, Skin) Wound #2 status is Healed - Epithelialized. Original cause of wound was Surgical Injury. The date acquired was: 07/10/2021. The wound has been in treatment 29 weeks. The wound is located on the Right,Medial Amputation Site - Transmetatarsal. The wound measures 0cm length x 0cm width x 0cm depth; 0cm^2 area and 0cm^3 volume. There is a medium amount of serosanguineous drainage noted. The wound margin is distinct with the outline attached to the wound base. There is large (67-100%) red, pink granulation within the wound bed. There is no necrotic tissue within the wound bed. The periwound skin appearance did not exhibit: Callus, Crepitus, Excoriation, Induration, Rash, Scarring, Dry/Scaly, Maceration, Atrophie Blanche, Cyanosis, Ecchymosis, Hemosiderin Staining, Mottled, Pallor, Rubor, Erythema. Periwound temperature was noted as No Abnormality. The periwound has tenderness on palpation. [name], [name] ([id number]) [id number] Page 7 of  8 Assessment Active Problems ICD-10 Non-pressure chronic ulcer of other part of right foot with necrosis of bone Type 2 diabetes mellitus with foot ulcer Osteomyelitis, unspecified Patient's amputation site healed within the medial tunneled area. She has reported no drainage over the past couple weeks. There are no signs of infection. At this time I recommended following up as needed. Follow-up with orthopedic surgery for orthotics. Plan Discharge From [facility] Services: Discharge from Wound Care Center - Call if any future wound care needs. Closely monitor feet daily. 1. Follow-up as needed 2. Discharge from clinic due to closed wound 3. Follow-up with orthopedic surgery for orthotics Electronic Signature(s) Signed: 02/05/2022 10:49:17 AM By: Geralyn Corwin DO Entered By: Geralyn Corwin on 02/04/2022 13:56:37 -------------------------------------------------------------------------------- HxROS Details Patient Name: Date of Service: [name], [name]. 02/04/2022 1:15 PM Medical Record Number: [id number] Patient Account Number: [id number] Date of Birth/Sex: Treating RN: 07-29-1980 (41 y.o. F) Primary Care Provider: Gwinda Passe Other Clinician: Referring Provider: Treating Provider/Extender: Grace Isaac in Treatment: 9 Information Obtained From Patient Cardiovascular Medical History: Positive for: Hypertension Endocrine Medical History: Positive for: Type II Diabetes Time with diabetes: Dx 2009 Treated with: Insulin, Oral agents Blood sugar tested every day: Yes Tested : daily Musculoskeletal Medical History: Positive for: Osteomyelitis - Right Transmet 12/18/20 Neurologic Medical History: Positive for: Neuropathy [name], [name] ([id number]) [id number] Page 8 of 8 Immunizations Pneumococcal Vaccine: Received Pneumococcal Vaccination: No Implantable Devices Yes Hospitalization / Surgery History Type of  Hospitalization/Surgery inpatient right foot abscess 10/12-10/18/2023 Family and Social History Cancer: Yes - Paternal Grandparents; Diabetes: Yes - Mother; Heart Disease: No; Hereditary Spherocytosis: No; Hypertension: Yes - Mother; Kidney Disease: No; Lung Disease: No; Seizures: No; Stroke: Yes -  Maternal Grandparents; Thyroid Problems: No; Tuberculosis: No; Never smoker; Marital Status - Single; Alcohol Use: Rarely; Drug Use: Prior History - Marijuana; Caffeine Use: Daily; Financial Concerns: No; Food, Clothing or Shelter Needs: No; Support System Lacking: No; Transportation Concerns: No Electronic Signature(s) Signed: 02/05/2022 10:49:17 AM By: Geralyn CorwinHoffman, Devron Cohick DO Entered By: Geralyn CorwinHoffman, Emit Kuenzel on 02/04/2022 13:53:08 -------------------------------------------------------------------------------- SuperBill Details Patient Name: Date of Service: [name], [name]. 02/04/2022 Medical Record Number: [id number] Patient Account Number: [id number] Date of Birth/Sex: Treating RN: May 18, 1980 (41 y.o. [name]) [name], [name] Primary Care Provider: Gwinda PasseEdwards, Michelle Other Clinician: Referring Provider: Treating Provider/Extender: Grace IsaacHoffman, Shaelee Forni Edwards, Michelle Weeks in Treatment: 54 Diagnosis Coding ICD-10 Codes Code Description [phone] Non-pressure chronic ulcer of other part of right foot with necrosis of bone E11.621 Type 2 diabetes mellitus with foot ulcer M86.9 Osteomyelitis, unspecified Facility Procedures : CPT4 Code: [id number] Description: 99213 - WOUND CARE VISIT-LEV 3 EST PT Modifier: Quantity: 1 Physician Procedures : CPT4 Code Description Modifier [id number] 99213 - WC PHYS LEVEL 3 - EST PT ICD-10 Diagnosis Description L97.514 Non-pressure chronic ulcer of other part of right foot with necrosis of bone E11.621 Type 2 diabetes mellitus with foot ulcer M86.9  Osteomyelitis, unspecified Quantity: 1 Electronic Signature(s) Signed: 02/05/2022 10:49:17 AM By: Geralyn CorwinHoffman, Keyia Moretto DO Entered  By: Geralyn CorwinHoffman, Torrie Namba on 02/04/2022 13:56:49

## 2022-02-06 NOTE — Progress Notes (Signed)
[name], [name] ([id number]) [id number] Page 1 of 8 Visit Report for 02/04/2022 Arrival Information Details Patient Name: Date of Service: [name], [name] [name] 02/04/2022 1:15 PM Medical Record Number: [id number] Patient Account Number: [id number] Date of Birth/Sex: Treating RN: 1980-11-10 (42 y.o. F) Primary Care [name]: Juluis Mire Other Clinician: Referring Hailey Miles: Treating [name]/Extender: Edmonia Lynch in Treatment: 79 Visit Information History Since Last Visit Added or deleted any medications: No Patient Arrived: Wheel Chair Any new allergies or adverse reactions: No Arrival Time: 13:30 Had a fall or experienced change in No Accompanied By: mother activities of daily living that may affect Transfer Assistance: None risk of falls: Patient Identification Verified: Yes Signs or symptoms of abuse/neglect since last visito No Secondary Verification Process Completed: Yes Hospitalized since last visit: No Patient Requires Transmission-Based No Implantable device outside of the clinic excluding No Precautions: cellular tissue based products placed in the center Patient Has Alerts: Yes since last visit: Patient Alerts: Patient on Blood Thinner Has Dressing in Place as Prescribed: Yes PICC R Arm Pain Present Now: Yes ABI 12/17/20 R=1.08 L=1.13 Electronic Signature(s) Signed: 02/04/2022 4:49:41 PM By: Erenest Blank Entered By: Erenest Blank on 02/04/2022 13:32:21 -------------------------------------------------------------------------------- Clinic Level of Care Assessment Details Patient Name: Date of Service: [name] V IS, [name] 02/04/2022 1:15 PM Medical Record Number: [id number] Patient Account Number: [id number] Date of Birth/Sex: Treating RN: 24-Sep-1980 (42 y.o. [name] Primary Care [name]: Juluis Mire Other Clinician: Referring Meleny [name]: Treating [name]/Extender: Edmonia Lynch in Treatment: 54 Clinic Level of Care Assessment Items TOOL 4 Quantity Score X- 1 0 Use when only an EandM [name] ASSESSMENTS - Nursing Assessment / Reassessment X- 1 10 Reassessment of Co-morbidities (includes updates in patient status) X- 1 5 Reassessment of Adherence to Treatment Plan ASSESSMENTS - Wound and Skin A ssessment / Reassessment X - Simple Wound Assessment / Reassessment - one wound 1 5 []  - 0 Complex Wound Assessment / Reassessment - multiple wounds X- 1 10 Dermatologic / Skin Assessment (not related to wound area) ASSESSMENTS - Focused Assessment []  - 0 Circumferential Edema Measurements - multi extremities []  - 0 Nutritional Assessment / Counseling / Intervention JAICEE, MICHELOTTI ([id number]) [id number] Page 2 of 8 []  - 0 Lower Extremity Assessment (monofilament, tuning fork, pulses) []  - 0 Peripheral Arterial Disease Assessment (using hand held doppler) ASSESSMENTS - Ostomy and/or Continence Assessment and Care []  - 0 Incontinence Assessment and Management []  - 0 Ostomy Care Assessment and Management (repouching, etc.) PROCESS - Coordination of Care X - Simple Patient / Family Education for ongoing care 1 15 []  - 0 Complex (extensive) Patient / Family Education for ongoing care X- 1 10 Staff obtains Programmer, systems, Records, T Results / Process Orders est []  - 0 Staff telephones HHA, Nursing Homes / Clarify orders / etc []  - 0 Routine Transfer to another Facility (non-emergent condition) []  - 0 Routine Hospital Admission (non-emergent condition) []  - 0 New Admissions / Biomedical engineer / Ordering NPWT Apligraf, etc. , []  - 0 Emergency Hospital Admission (emergent condition) X- 1 10 Simple Discharge Coordination []  - 0 Complex (extensive) Discharge Coordination PROCESS - Special Needs []  - 0 Pediatric / Minor Patient Management []  - 0 Isolation Patient  Management []  - 0 Hearing / Language / Visual special needs []  - 0 Assessment of Community assistance (transportation, D/C planning, etc.) []  - 0 Additional assistance / Altered mentation []  - 0 Support Surface(s) Assessment (bed,  cushion, seat, etc.) INTERVENTIONS - Wound Cleansing / Measurement X - Simple Wound Cleansing - one wound 1 5 []  - 0 Complex Wound Cleansing - multiple wounds X- 1 5 Wound Imaging (photographs - any number of wounds) []  - 0 Wound Tracing (instead of photographs) X- 1 5 Simple Wound Measurement - one wound []  - 0 Complex Wound Measurement - multiple wounds INTERVENTIONS - Wound Dressings []  - 0 Small Wound Dressing one or multiple wounds []  - 0 Medium Wound Dressing one or multiple wounds []  - 0 Large Wound Dressing one or multiple wounds []  - 0 Application of Medications - topical []  - 0 Application of Medications - injection INTERVENTIONS - Miscellaneous []  - 0 External ear exam []  - 0 Specimen Collection (cultures, biopsies, blood, body fluids, etc.) []  - 0 Specimen(s) / Culture(s) sent or taken to Lab for analysis []  - 0 Patient Transfer (multiple staff / / Similar devices) []  - 0 Simple Staple / Suture removal (25 or less) []  - 0 Complex Staple / Suture removal (26 or more) []  - 0 Hypo / Hyperglycemic Management (close monitor of Blood Glucose) Stanfield, Vanity L (  .pdf Page 3 of 8 []  - 0 Ankle / Brachial Index (ABI) - do not check if billed separately X- 1 5 Vital Signs Has the patient been seen at the hospital within the last three years: Yes Total Score: 85 Level Of Care: New/Established - Level 3 Electronic Signature(s) Signed: 02/05/2022 6:38:55 PM By: RN, BSN Entered By: on 02/04/2022 13:49:02 -------------------------------------------------------------------------------- Encounter Discharge Information Details Patient Name: Date of  Service: [name] V IS, [name] 02/04/2022 1:15 PM Medical Record Number: Patient Account Number: Date of Birth/Sex: Treating RN: Dec 02, 1980 (41 y.o. Primary Care Creighton Longley: Nurse, adult Other Clinician: Referring Enoc Getter: Treating Deklyn Trachtenberg/Extender: in Treatment: 28 Encounter Discharge Information Items Discharge Condition: Stable Ambulatory Status: Wheelchair Discharge Destination: Home Transportation: Private Auto Accompanied By: mother Schedule Follow-up Appointment: No Clinical Summary of Care: Electronic Signature(s) Signed: 02/05/2022 6:38:55 PM By: [id number] RN, BSN Entered By: ) [id number] on 02/04/2022 13:49:31 -------------------------------------------------------------------------------- Lower Extremity Assessment Details Patient Name: Date of Service: [name] V IS, [name] 02/04/2022 1:15 PM Medical Record Number: Shawn Stall Patient Account Number: Shawn Stall Date of Birth/Sex: Treating RN: [birth year] (41 y.o. F) Primary Care Bradon Fester: 02/06/2022 Other Clinician: Referring Rylynne Schicker: Treating Ajna Moors/Extender: [id number] in Treatment: 54 Edema Assessment Assessed: [Left: No] [Right: No] Edema: [Left: Ye] [Right: s] Calf Left: Right: Point of Measurement: 34 cm From Medial Instep 51.2 cm Ankle Left: Right: Point of Measurement: 8 cm From Medial Instep 28.5 cm Electronic Signature(s) Signed: 02/04/2022 4:49:41 PM By: 06/08/1980, [facility] L1/22/2024 4:49:41 PM By: Grace Isaac Signed: (57) [id number] Page 4 of 8 Entered By: Shawn Stall on 02/04/2022 13:40:03 -------------------------------------------------------------------------------- Multi Wound Chart Details Patient Name: Date of Service: 02/06/2022 [name], 02/06/2022 [name] 02/04/2022 1:15 PM Medical Record Number: [id number] Patient Account Number:  06/08/1980 Date of Birth/Sex: Treating RN: 08/30/1980 (41 y.o. F) Primary Care Sharene Krikorian: Grace Isaac Other Clinician: Referring Shataya Winkles: Treating Nezzie Manera/Extender: 02/06/2022 in Treatment: 54 Vital Signs Height(in): 69 Pulse(bpm): 87 Weight(lbs): Blood Pressure(mmHg): 107/71 Body Mass Index(BMI): Temperature(F): 98.2 Respiratory Rate(breaths/min): 18 [2:Photos:] [N/A:N/A] Right, Medial Amputation Site - N/A N/A Wound Location: Transmetatarsal Surgical Injury N/A N/A Wounding Event: Diabetic Wound/Ulcer of the Lower N/A N/A Primary Etiology: Extremity Open Surgical Wound N/A N/A  Secondary Etiology: Hypertension, Type II Diabetes, N/A N/A Comorbid History: Osteomyelitis, Neuropathy 07/10/2021 N/A N/A Date Acquired: 52 N/A N/A Weeks of Treatment: Healed - Epithelialized N/A N/A Wound Status: No N/A N/A Wound Recurrence: 0x0x0 N/A N/A Measurements L x W x D (cm) 0 N/A N/A A (cm) : rea 0 N/A N/A Volume (cm) : 100.00% N/A N/A % Reduction in A rea: 100.00% N/A N/A % Reduction in Volume: Grade 3 N/A N/A Classification: Medium N/A N/A Exudate A mount: Serosanguineous N/A N/A Exudate Type: red, brown N/A N/A Exudate Color: Distinct, outline attached N/A N/A Wound Margin: Large (67-100%) N/A N/A Granulation A mount: Red, Pink N/A N/A Granulation Quality: None Present (0%) N/A N/A Necrotic A mount: Fascia: No N/A N/A Exposed Structures: Fat Layer (Subcutaneous Tissue): No Tendon: No Muscle: No Joint: No Bone: No Small (1-33%) N/A N/A Epithelialization: Excoriation: No N/A N/A Periwound Skin Texture: Induration: No Callus: No Crepitus: No Rash: No Scarring: No Maceration: No N/A N/A Periwound Skin Moisture: Dry/Scaly: No Atrophie Blanche: No N/A N/A Periwound Skin Color: Cyanosis: No Ecchymosis: No Costantino, Bernedette L ([id number]) [id number] Page 5 of 8 Erythema: No Hemosiderin  Staining: No Mottled: No Pallor: No Rubor: No No Abnormality N/A N/A Temperature: Yes N/A N/A Tenderness on Palpation: Treatment Notes Electronic Signature(s) Signed: 02/05/2022 10:49:17 AM By: Kalman Shan DO Entered By: Kalman Shan on 02/04/2022 13:52:17 -------------------------------------------------------------------------------- Multi-Disciplinary Care Plan Details Patient Name: Date of Service: [name] V IS, [name] 02/04/2022 1:15 PM Medical Record Number: [id number] Patient Account Number: [id number] Date of Birth/Sex: Treating RN: 24-Sep-1980 (42 y.o. [name] Primary Care Adil Tugwell: Juluis Mire Other Clinician: Referring Mashell Sieben: Treating Sanav Remer/Extender: Edmonia Lynch in Treatment: 67 Active Inactive Electronic Signature(s) Signed: 02/05/2022 6:38:55 PM By: [name] RN, BSN Entered By: [name] on 02/04/2022 13:47:36 -------------------------------------------------------------------------------- Pain Assessment Details Patient Name: Date of Service: [name] [name] IS, [name] 02/04/2022 1:15 PM Medical Record Number: [id number] Patient Account Number: [id number] Date of Birth/Sex: Treating RN: [birth year] (42 y.o. F) Primary Care Gabbriella Presswood: Juluis Mire Other Clinician: Referring Lakeesha Fontanilla: Treating Abhiram Criado/Extender: Edmonia Lynch in Treatment: 56 Active Problems Location of Pain Severity and Description of Pain Patient Has Paino Yes Site Locations Pain Location: CLAIRISSA, VALVANO ([id number]) [phone].pdf Page 6 of 8 Pain Location: Generalized Pain, Pain in Ulcers Rate the pain. Current Pain Level: 2 Pain Management and Medication Current Pain Management: Electronic Signature(s) Signed: 02/04/2022 4:49:41 PM By: Erenest Blank Entered By: Erenest Blank on 02/04/2022  13:33:22 -------------------------------------------------------------------------------- Patient/Caregiver Education Details Patient Name: Date of Service: [name] [name], [name] 1/22/2024andnbsp1:15 PM Medical Record Number: [id number] Patient Account Number: [id number] Date of Birth/Gender: Treating RN: 1980-09-18 (42 y.o. [name] Primary Care Physician: Juluis Mire Other Clinician: Referring Physician: Treating Physician/Extender: Edmonia Lynch in Treatment: 79 Education Assessment Education Provided To: Patient Education Topics Provided Wound/Skin Impairment: Handouts: Caring for Your Ulcer Methods: Explain/Verbal Responses: Reinforcements needed Electronic Signature(s) Signed: 02/05/2022 6:38:55 PM By: [name] RN, BSN Entered By: [name] on 02/04/2022 13:31:03 -------------------------------------------------------------------------------- Wound Assessment Details Patient Name: Date of Service: [name] [name], [name] 02/04/2022 1:15 PM Medical Record Number: [id number] Patient Account Number: [id number] Date of Birth/Sex: Treating RN: 08/25/1980 (42 y.o. [name] Primary Care Jaiden Dinkins: Juluis Mire Other Clinician: RANETTE, LUCKADOO ([id number]) [id number] Page 7 of 8 Referring Kourtnee Lahey: Treating Teryl Mcconaghy/Extender: Edmonia Lynch in Treatment: 86 Wound Status Wound Number: 2 Primary Etiology: Diabetic Wound/Ulcer  of the Lower Extremity Wound Location: Right, Medial Amputation Site - Transmetatarsal Secondary Open Surgical Wound Etiology: Wounding Event: Surgical Injury Wound Status: Healed - Epithelialized Date Acquired: 07/10/2021 Comorbid History: Hypertension, Type II Diabetes, Osteomyelitis, Weeks Of Treatment: 29 Neuropathy Clustered Wound: No Photos Wound Measurements Length: (cm) Width: (cm) Depth: (cm) Area: (cm) Volume: (cm) 0 % Reduction in  Area: 100% 0 % Reduction in Volume: 100% 0 Epithelialization: Small (1-33%) 0 0 Wound Description Classification: Wound Margin: Exudate Amount: Exudate Type: Exudate Color: Grade 3 Distinct, outline attached Medium Serosanguineous red, brown Wound Bed Granulation Amount: Large (67-100%) Exposed Structure Granulation Quality: Red, Pink Fascia Exposed: No Necrotic Amount: None Present (0%) Fat Layer (Subcutaneous Tissue) Exposed: No Tendon Exposed: No Muscle Exposed: No Joint Exposed: No Bone Exposed: No Periwound Skin Texture Texture Color No Abnormalities Noted: No No Abnormalities Noted: No Callus: No Atrophie Blanche: No Crepitus: No Cyanosis: No Excoriation: No Ecchymosis: No Induration: No Erythema: No Rash: No Hemosiderin Staining: No Scarring: No Mottled: No Pallor: No Moisture Rubor: No No Abnormalities Noted: No Dry / Scaly: No Temperature / Pain Maceration: No Temperature: No Abnormality Tenderness on Palpation: Yes Electronic Signature(s) Signed: 02/05/2022 6:38:55 PM By: [name] RN, BSN Entered By: [name] on 02/04/2022 13:47:25 Viegas, Shanda Bumps ([id number] Page 8 of 8 -------------------------------------------------------------------------------- Vitals Details Patient Name: Date of Service: [name], [name] [name] 02/04/2022 1:15 PM Medical Record Number: [id number] Patient Account Number: [id number] Date of Birth/Sex: Treating RN: 05/30/1980 (42 y.o. F) Primary Care Saidee Geremia: Juluis Mire Other Clinician: Referring Baldo Hufnagle: Treating Morrison Masser/Extender: Edmonia Lynch in Treatment: 54 Vital Signs Time Taken: 13:32 Temperature (F): 98.2 Height (in): 69 Pulse (bpm): 87 Respiratory Rate (breaths/min): 18 Blood Pressure (mmHg): 107/71 Reference Range: 80 - 120 mg / dl Electronic Signature(s) Signed: 02/04/2022 4:49:41 PM By: Erenest Blank Entered By: Erenest Blank on 02/04/2022 13:32:58

## 2022-02-13 ENCOUNTER — Ambulatory Visit (INDEPENDENT_AMBULATORY_CARE_PROVIDER_SITE_OTHER): Payer: Medicaid Other | Admitting: Primary Care

## 2022-02-13 ENCOUNTER — Encounter (INDEPENDENT_AMBULATORY_CARE_PROVIDER_SITE_OTHER): Payer: Self-pay | Admitting: Primary Care

## 2022-02-13 VITALS — BP 105/61 | HR 74 | Resp 16 | Ht 69.0 in | Wt 330.4 lb

## 2022-02-13 DIAGNOSIS — E1165 Type 2 diabetes mellitus with hyperglycemia: Secondary | ICD-10-CM | POA: Diagnosis not present

## 2022-02-13 LAB — POCT GLYCOSYLATED HEMOGLOBIN (HGB A1C): HbA1c, POC (controlled diabetic range): 8.8 % — AB (ref 0.0–7.0)

## 2022-02-13 NOTE — Progress Notes (Signed)
Subjective:  Patient ID: [name], female    DOB: Jan 21, 1980  Age: 42 y.o. MRN: [id number]  CC: Diabetes   HPI [name] presents forFollow-up of diabetes. Patient does not check blood sugar at home  Compliant with meds - Yes Checking CBGs? No  Fasting avg -   Postprandial average -  Exercising regularly? - No Watching carbohydrate intake? - Yes Neuropathy ? - No Hypoglycemic events - No  - Recovers with :   Pertinent ROS:  Polyuria - No Polydipsia - No Vision problems - No  Medications as noted below. Taking them regularly without complication/adverse reaction being reported today.   History [name] has a past medical history of Class 3 obesity ([place]) (12/16/2020) and Diabetes mellitus.   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   Her family history includes Diabetes in her mother and another family member; Hypertension in her mother.[name] reports that [name] has quit smoking. Her smoking use included cigarettes. [name] does not have any smokeless tobacco history on file. [name] reports current alcohol use. [name] reports current drug use. Drug: Marijuana.  Current Outpatient Medications on File Prior to Visit  Medication Sig Dispense Refill  . amoxicillin (AMOXIL) 500 MG capsule Take 1 capsule (500 mg total) by mouth 3 (three) times daily. 90 capsule 2  . apixaban (ELIQUIS) 5 MG TABS tablet Take 1 tablet (5mg ) by mouth twice daily. 180 tablet 1  . atorvastatin (LIPITOR) 80 MG tablet Take 1 tablet (80 mg total) by mouth once daily. 90 tablet 3  . Blood Glucose Monitoring Suppl (TRUE METRIX METER) w/Device KIT Use to check blood sugar twice a day. 1 kit 0  . Dulaglutide (TRULICITY) 1.5 DT/2.6ZT SOPN Inject 1.5 mg into the skin once a week. 2 mL 2  . empagliflozin (JARDIANCE) 10 MG TABS  tablet Take 1 tablet (10 mg total) by mouth daily. 30 tablet 2  . gabapentin (NEURONTIN) 300 MG capsule Take 2 capsules by mouth 3 times daily. 180 capsule 2  . gentamicin ointment (GARAMYCIN) 0.1 % Apply 1 Application topically daily. 30 g 0  . glucose blood (TRUE METRIX BLOOD GLUCOSE TEST) test strip Use to check blood sugar twice a day. 100 each 2  . insulin glargine (LANTUS SOLOSTAR) 100 UNIT/ML Solostar Pen Inject 70 Units into the skin daily. 15 mL 3  . Insulin Pen Needle 32G X 4 MM MISC use as directed 100 each 2  . lisinopril (ZESTRIL) 2.5 MG tablet Take 1 tablet (2.5 mg total) by mouth daily. 90 tablet 0  . metFORMIN (GLUCOPHAGE-XR) 500 MG 24 hr tablet Take 2 tablets (1,000 mg total) by mouth 2 (two) times daily. 120 tablet 2  . oxyCODONE (OXY IR/ROXICODONE) 5 MG immediate release tablet Take 5 mg by mouth every 6 (six) hours as needed for moderate pain or severe pain.    . TRUEplus Lancets 28G MISC Use to check blood sugar twice a day. 100 each 2   No current facility-administered medications on file prior to visit.    ROS Comprehensive ROS Pertinent positive and negative noted in HPI    Objective:  Blood Pressure 105/61   Pulse 74   Respiration 16   Height 5\' 9"  (1.753 m)   Weight (Abnormal) 330 lb 6.4 oz (149.9 kg)   Oxygen Saturation 100%   Body Mass Index 48.79 kg/m  BP Readings from Last 3 Encounters:  02/13/22 105/61  01/30/22 123/77  11/26/21 106/71    Wt Readings from Last 3 Encounters:  02/13/22 (Abnormal) 330 lb 6.4 oz (149.9 kg)  01/30/22 (Abnormal) 330 lb (149.7 kg)  11/14/21 (Abnormal) 325 lb (147.4 kg)    Physical Exam Vitals reviewed.  Constitutional:      Appearance: [name] is obese.  HENT:     Head: Normocephalic.     Right Ear: Tympanic membrane and external ear normal.     Left Ear: Tympanic membrane and external ear normal.     Nose: Nose normal.  Cardiovascular:     Rate and Rhythm: Normal rate and regular rhythm.  Pulmonary:     Effort:  Pulmonary effort is normal.     Breath sounds: Normal breath sounds.  Musculoskeletal:     Cervical back: Normal range of motion and neck supple.  Skin:    General: Skin is warm and dry.  Neurological:     Mental Status: [name] is oriented to person, place, and time.  Psychiatric:        Mood and Affect: Mood normal.        Behavior: Behavior normal.   Lab Results  Component Value Date   HGBA1C 8.8 (A) 02/13/2022   HGBA1C 8.8 (H) 10/29/2021   HGBA1C 8.5 (A) 10/09/2021    Lab Results  Component Value Date   WBC 7.6 01/30/2022   HGB 12.0 01/30/2022   HCT 36.8 01/30/2022   PLT 281 01/30/2022   GLUCOSE 225 (H) 01/30/2022   CHOL 210 (H) 10/17/2021   TRIG 159 (H) 10/17/2021   HDL 56 10/17/2021   LDLCALC 126 (H) 10/17/2021   ALT 4 (L) 01/30/2022   AST 9 (L) 01/30/2022   NA 135 01/30/2022   K 4.0 01/30/2022   CL 100 01/30/2022   CREATININE 0.79 01/30/2022   BUN 11 01/30/2022   CO2 25 01/30/2022   INR 1.0 03/08/2021   HGBA1C 8.8 (A) 02/13/2022     Assessment & Plan:  [name] was seen today for diabetes.  Diagnoses and all orders for this visit:  Type 2 diabetes mellitus with hyperglycemia, unspecified whether long term insulin use ([place]) - educated on lifestyle modifications, including but not limited to diet choices and adding exercise to daily routine.   -     POCT glycosylated hemoglobin (Hb A1C) 8.8 stable - infection / amputation, followed by ID -     Microalbumin / creatinine urine ratio -     Ambulatory referral to Optometry    I am having [name] maintain her True Metrix Meter, True Metrix Blood Glucose Test, TRUEplus Lancets 28G, oxyCODONE, gabapentin, apixaban, metFORMIN, gentamicin ointment, Trulicity, lisinopril, Lantus SoloStar, Insulin Pen Needle, atorvastatin, empagliflozin, and amoxicillin.  No orders of the defined types were placed in this encounter.    Follow-up:   Return in about 14 weeks (around 05/22/2022) for DM.  The above assessment  and management plan was discussed with the patient. The patient verbalized understanding of and has agreed to the management plan. Patient is aware to call the clinic if symptoms fail to improve or worsen. Patient is aware when to return to the clinic for a follow-up visit. Patient educated on when it is appropriate to go to the emergency department.   Juluis Mire, NP-C

## 2022-02-15 LAB — MICROALBUMIN / CREATININE URINE RATIO
Creatinine, Urine: 113.4 mg/dL
Microalb/Creat Ratio: 4 mg/g creat (ref 0–29)
Microalbumin, Urine: 4.7 ug/mL

## 2022-02-21 ENCOUNTER — Other Ambulatory Visit: Payer: Self-pay

## 2022-02-26 ENCOUNTER — Other Ambulatory Visit: Payer: Self-pay

## 2022-02-27 ENCOUNTER — Telehealth: Payer: Self-pay | Admitting: Primary Care

## 2022-02-27 ENCOUNTER — Telehealth: Payer: Self-pay | Admitting: Emergency Medicine

## 2022-02-27 NOTE — Telephone Encounter (Signed)
Copied from [place]. Topic: Referral - Request for Referral >> Feb 26, 2022  4:36 PM Leilani Able wrote: Has patient seen PCP for this complaint? No. *If NO, is insurance requiring patient see PCP for this issue before PCP can refer them? Referral for which specialty: Eye dr Preferred provider/office: Pt does not know Reason for referral: has already spoken to dr re referral. Now Pt is very upset that she has not gotten a referral for an eye dr, looked in chart and referral was done and closed. She states that she does not know where she needs to go as that would be up to the dr. Abbott Pao said she knew it was not done b/c she called the dr and they said it was not done. However she would not disclose which eye dr she called. Please advise, unable to get pt to disclose much info.[phone]

## 2022-02-27 NOTE — Telephone Encounter (Signed)
Returned pt call pt didn't answer lvm   Looked in pt chart. Referral was placed on 02/13/22 and was sent to  [facility] Referral to [facility]   [address], [place], [place], [id number] Phone [phone] Fax:     [phone]

## 2022-02-27 NOTE — Telephone Encounter (Signed)
Copied from [place]. Topic: Referral - Request for Referral >> Feb 26, 2022  4:36 PM Leilani Able wrote: Has patient seen PCP for this complaint? No. *If NO, is insurance requiring patient see PCP for this issue before PCP can refer them? Referral for which specialty: Eye dr Preferred provider/office: Pt does not know Reason for referral: has already spoken to dr re referral. Now Pt is very upset that she has not gotten a referral for an eye dr, looked in chart and referral was done and closed. She states that she does not know where she needs to go as that would be up to the dr. Abbott Pao said she knew it was not done b/c she called the dr and they said it was not done. However she would not disclose which eye dr she called. Please advise, unable to get pt to disclose much info.[phone]

## 2022-02-27 NOTE — Telephone Encounter (Signed)
[name] could you look into this

## 2022-02-27 NOTE — Telephone Encounter (Signed)
Patient states that when she was transferred to [facility] this morning she was told that they do not have a referral on file for her. Can referral be resent?

## 2022-03-14 ENCOUNTER — Telehealth: Payer: Self-pay | Admitting: *Deleted

## 2022-03-14 NOTE — Telephone Encounter (Signed)
Copied from [place] [id number]. Topic: Referral - Status >> Mar 07, 2022  1:14 PM Cyndi Bender wrote: Reason for CRM: Pt stated she does not understand why the referral for an eye doctor has not been done. Pt stated she is frustrated because she has called several times and the referral still has not been sent to an eye doctor. Pt stated the eye doctor that she contacted told her that they only do the exam and she still would have to find an office for the glasses. Pt requests call back to discuss. Cb# [phone]

## 2022-04-02 ENCOUNTER — Other Ambulatory Visit: Payer: Self-pay

## 2022-04-03 ENCOUNTER — Other Ambulatory Visit: Payer: Self-pay | Admitting: Family Medicine

## 2022-04-03 ENCOUNTER — Other Ambulatory Visit: Payer: Self-pay

## 2022-04-03 MED ORDER — TRULICITY 1.5 MG/0.5ML ~~LOC~~ SOAJ
1.5000 mg | SUBCUTANEOUS | 0 refills | Status: DC
  Filled 2022-04-03: qty 2, 28d supply, fill #0
  Filled 2022-05-29: qty 2, 28d supply, fill #1
  Filled 2022-07-02 – 2022-08-05 (×2): qty 2, 28d supply, fill #2

## 2022-04-04 ENCOUNTER — Other Ambulatory Visit: Payer: Self-pay

## 2022-04-17 ENCOUNTER — Ambulatory Visit: Payer: Medicaid Other | Admitting: Internal Medicine

## 2022-04-18 ENCOUNTER — Ambulatory Visit: Payer: Medicaid Other | Admitting: Internal Medicine

## 2022-05-23 ENCOUNTER — Ambulatory Visit: Payer: Medicaid Other | Admitting: Internal Medicine

## 2022-05-29 ENCOUNTER — Other Ambulatory Visit: Payer: Self-pay | Admitting: Family Medicine

## 2022-05-29 ENCOUNTER — Other Ambulatory Visit: Payer: Self-pay

## 2022-05-29 MED ORDER — LANTUS SOLOSTAR 100 UNIT/ML ~~LOC~~ SOPN
70.0000 [IU] | PEN_INJECTOR | Freq: Every day | SUBCUTANEOUS | 3 refills | Status: DC
  Filled 2022-05-29: qty 15, 21d supply, fill #0
  Filled 2022-07-02 – 2022-08-05 (×2): qty 15, 21d supply, fill #1
  Filled 2022-12-05: qty 15, 21d supply, fill #2
  Filled 2023-01-13: qty 15, 21d supply, fill #3

## 2022-05-29 NOTE — Telephone Encounter (Signed)
Requested Prescriptions  Pending Prescriptions Disp Refills   insulin glargine (LANTUS SOLOSTAR) 100 UNIT/ML Solostar Pen 15 mL 3    Sig: Inject 70 Units into the skin daily.     Endocrinology:  Diabetes - Insulins Failed - 05/29/2022  9:32 AM      Failed - HBA1C is between 0 and 7.9 and within 180 days    HbA1c, POC (controlled diabetic range)  Date Value Ref Range Status  02/13/2022 8.8 (A) 0.0 - 7.0 % Final         Passed - Valid encounter within last 6 months    Recent Outpatient Visits           3 months ago Type 2 diabetes mellitus with hyperglycemia, unspecified whether long term insulin use (HCC)   [place] [facility] Grayce Sessions, NP   5 months ago Type 2 diabetes mellitus with hyperglycemia, unspecified whether long term insulin use [facility])   [place] [facility] [place], [place] L, RPH-CPP   6 months ago Type 2 diabetes mellitus with hyperglycemia, unspecified whether long term insulin use (HCC)   [place] [facility] Grayce Sessions, NP   7 months ago Type 2 diabetes mellitus with hyperglycemia, unspecified whether long term insulin use [facility])   [place] [facility] [place], [place] L, RPH-CPP   10 months ago Type 2 diabetes mellitus with hyperglycemia, unspecified whether long term insulin use [facility])   [facility] [place], Cornelius Moras, RPH-CPP

## 2022-06-26 ENCOUNTER — Other Ambulatory Visit (HOSPITAL_COMMUNITY): Payer: Self-pay

## 2022-06-26 ENCOUNTER — Other Ambulatory Visit (INDEPENDENT_AMBULATORY_CARE_PROVIDER_SITE_OTHER): Payer: Self-pay | Admitting: Primary Care

## 2022-06-26 ENCOUNTER — Other Ambulatory Visit: Payer: Self-pay

## 2022-06-26 DIAGNOSIS — Z760 Encounter for issue of repeat prescription: Secondary | ICD-10-CM

## 2022-06-26 DIAGNOSIS — E1165 Type 2 diabetes mellitus with hyperglycemia: Secondary | ICD-10-CM

## 2022-06-27 ENCOUNTER — Other Ambulatory Visit: Payer: Self-pay

## 2022-06-27 MED ORDER — METFORMIN HCL ER 500 MG PO TB24
1000.0000 mg | ORAL_TABLET | Freq: Two times a day (BID) | ORAL | 2 refills | Status: DC
  Filled 2022-06-27: qty 120, 30d supply, fill #0
  Filled 2022-12-05: qty 120, 30d supply, fill #1
  Filled 2023-03-15: qty 120, 30d supply, fill #2

## 2022-06-27 NOTE — Telephone Encounter (Signed)
Requested medication (s) are due for refill today:   Not sure   Provider to review  Requested medication (s) are on the active medication list:   Yes  Future visit scheduled:   No   LOV 02/13/2022   Last ordered: 06/26/2021 #120, 2 refills  (3 month supply)  Returned because not sure she is still taking this.   No refills noted since 06/26/2021   Requested Prescriptions  Pending Prescriptions Disp Refills   metFORMIN (GLUCOPHAGE-XR) 500 MG 24 hr tablet 120 tablet 2    Sig: Take 2 tablets (1,000 mg total) by mouth 2 (two) times daily.     Endocrinology:  Diabetes - Biguanides Failed - 06/26/2022  8:08 AM      Failed - HBA1C is between 0 and 7.9 and within 180 days    HbA1c, POC (controlled diabetic range)  Date Value Ref Range Status  02/13/2022 8.8 (A) 0.0 - 7.0 % Final         Failed - B12 Level in normal range and within 720 days    No results found for: "VITAMINB12"       Passed - Cr in normal range and within 360 days    Creat  Date Value Ref Range Status  01/30/2022 0.79 0.50 - 0.99 mg/dL Final         Passed - eGFR in normal range and within 360 days    GFR, Estimated  Date Value Ref Range Status  10/31/2021 >60 >60 mL/min Final    Comment:    (NOTE) Calculated using the CKD-EPI Creatinine Equation (2021)    eGFR  Date Value Ref Range Status  10/17/2021 108 >59 mL/min/1.73 Final         Passed - Valid encounter within last 6 months    Recent Outpatient Visits           4 months ago Type 2 diabetes mellitus with hyperglycemia, unspecified whether long term insulin use (HCC)   [place] [facility] Grayce Sessions, NP   6 months ago Type 2 diabetes mellitus with hyperglycemia, unspecified whether long term insulin use (HCC)   [place] [facility] [place], [place] L, RPH-CPP   7 months ago Type 2 diabetes mellitus with hyperglycemia, unspecified whether long term insulin use (HCC)   [place] [facility] Grayce Sessions, NP   8 months ago Type 2 diabetes mellitus with hyperglycemia, unspecified whether long term insulin use (HCC)   [place] [facility] [place], [place] L, RPH-CPP   10 months ago Type 2 diabetes mellitus with hyperglycemia, unspecified whether long term insulin use [facility])    [facility] [place], [place] L, RPH-CPP              Passed - CBC within normal limits and completed in the last 12 months    WBC  Date Value Ref Range Status  01/30/2022 7.6 3.8 - 10.8 Thousand/uL Final   RBC  Date Value Ref Range Status  01/30/2022 4.52 3.80 - 5.10 Million/uL Final   Hemoglobin  Date Value Ref Range Status  01/30/2022 12.0 11.7 - 15.5 g/dL Final  16/10/9602 54.0 11.1 - 15.9 g/dL Final   HCT  Date Value Ref Range Status  01/30/2022 36.8 35.0 - 45.0 % Final   Hematocrit  Date Value Ref Range Status  10/17/2021 39.1 34.0 - 46.6 % Final   MCHC  Date Value  Ref Range Status  01/30/2022 32.6 32.0 - 36.0 g/dL Final   [facility]  Date Value Ref Range Status  01/30/2022 26.5 (L) 27.0 - 33.0 pg Final   MCV  Date Value Ref Range Status  01/30/2022 81.4 80.0 - 100.0 fL Final  10/17/2021 83 79 - 97 fL Final   No results found for: "PLTCOUNTKUC", "LABPLAT", "POCPLA" RDW  Date Value Ref Range Status  01/30/2022 14.0 11.0 - 15.0 % Final  10/17/2021 14.3 11.7 - 15.4 % Final

## 2022-07-01 ENCOUNTER — Other Ambulatory Visit: Payer: Self-pay

## 2022-07-02 ENCOUNTER — Other Ambulatory Visit: Payer: Self-pay

## 2022-07-02 ENCOUNTER — Other Ambulatory Visit: Payer: Self-pay | Admitting: Family Medicine

## 2022-07-02 NOTE — Telephone Encounter (Signed)
Requested medication (s) are due for refill today: yes  Requested medication (s) are on the active medication list:yes  Last refill:  05/07/21 #180 2 RF)  order has expired  Future visit scheduled: no Notes to clinic:  order expired 1 year after written - please reorder if appropriate   Requested Prescriptions  Pending Prescriptions Disp Refills   gabapentin (NEURONTIN) 300 MG capsule 180 capsule 2    Sig: Take 2 capsules by mouth 3 times daily.     Neurology: Anticonvulsants - gabapentin Passed - 07/02/2022 11:37 AM      Passed - Cr in normal range and within 360 days    Creat  Date Value Ref Range Status  01/30/2022 0.79 0.50 - 0.99 mg/dL Final         Passed - Completed PHQ-2 or PHQ-9 in the last 360 days      Passed - Valid encounter within last 12 months    Recent Outpatient Visits           4 months ago Type 2 diabetes mellitus with hyperglycemia, unspecified whether long term insulin use (HCC)   [place] [facility] Grayce Sessions, NP   6 months ago Type 2 diabetes mellitus with hyperglycemia, unspecified whether long term insulin use [facility])   [place] [facility] [place], [place] L, RPH-CPP   7 months ago Type 2 diabetes mellitus with hyperglycemia, unspecified whether long term insulin use (HCC)   [place] [facility] Grayce Sessions, NP   8 months ago Type 2 diabetes mellitus with hyperglycemia, unspecified whether long term insulin use [facility])   [place] [facility] [place], [place] L, RPH-CPP   11 months ago Type 2 diabetes mellitus with hyperglycemia, unspecified whether long term insulin use [facility])   [place] [facility] [place], Cornelius Moras, RPH-CPP

## 2022-07-03 NOTE — Telephone Encounter (Signed)
Will forward to provider  

## 2022-07-04 ENCOUNTER — Other Ambulatory Visit (HOSPITAL_COMMUNITY): Payer: Self-pay

## 2022-07-04 ENCOUNTER — Other Ambulatory Visit: Payer: Self-pay | Admitting: Family Medicine

## 2022-07-08 ENCOUNTER — Other Ambulatory Visit (INDEPENDENT_AMBULATORY_CARE_PROVIDER_SITE_OTHER): Payer: Self-pay | Admitting: Primary Care

## 2022-07-08 MED ORDER — GABAPENTIN 300 MG PO CAPS
600.0000 mg | ORAL_CAPSULE | Freq: Three times a day (TID) | ORAL | 2 refills | Status: DC
  Filled 2022-07-08: qty 180, 30d supply, fill #0
  Filled 2022-12-18: qty 180, 30d supply, fill #1
  Filled 2023-03-15: qty 180, 30d supply, fill #2

## 2022-07-09 ENCOUNTER — Other Ambulatory Visit: Payer: Self-pay

## 2022-07-12 ENCOUNTER — Other Ambulatory Visit: Payer: Self-pay

## 2022-08-05 ENCOUNTER — Other Ambulatory Visit: Payer: Self-pay

## 2022-08-08 ENCOUNTER — Other Ambulatory Visit: Payer: Self-pay

## 2022-08-08 ENCOUNTER — Encounter (HOSPITAL_BASED_OUTPATIENT_CLINIC_OR_DEPARTMENT_OTHER): Payer: Medicaid Other | Attending: Internal Medicine | Admitting: Internal Medicine

## 2022-08-08 DIAGNOSIS — I10 Essential (primary) hypertension: Secondary | ICD-10-CM | POA: Insufficient documentation

## 2022-08-08 DIAGNOSIS — E1140 Type 2 diabetes mellitus with diabetic neuropathy, unspecified: Secondary | ICD-10-CM | POA: Insufficient documentation

## 2022-08-08 DIAGNOSIS — E11621 Type 2 diabetes mellitus with foot ulcer: Secondary | ICD-10-CM | POA: Diagnosis not present

## 2022-08-08 DIAGNOSIS — L97512 Non-pressure chronic ulcer of other part of right foot with fat layer exposed: Secondary | ICD-10-CM | POA: Insufficient documentation

## 2022-08-08 DIAGNOSIS — M86671 Other chronic osteomyelitis, right ankle and foot: Secondary | ICD-10-CM | POA: Insufficient documentation

## 2022-08-08 DIAGNOSIS — L97514 Non-pressure chronic ulcer of other part of right foot with necrosis of bone: Secondary | ICD-10-CM | POA: Insufficient documentation

## 2022-08-08 DIAGNOSIS — Z86718 Personal history of other venous thrombosis and embolism: Secondary | ICD-10-CM | POA: Insufficient documentation

## 2022-08-08 MED ORDER — DOXYCYCLINE HYCLATE 100 MG PO TABS
100.0000 mg | ORAL_TABLET | Freq: Two times a day (BID) | ORAL | 0 refills | Status: DC
  Filled 2022-08-08: qty 28, 14d supply, fill #0

## 2022-08-08 MED ORDER — DAKINS (1/2 STRENGTH) 0.25 % EX SOLN
CUTANEOUS | 2 refills | Status: AC
  Filled 2022-08-08: qty 473, 30d supply, fill #0

## 2022-08-08 MED ORDER — AMOXICILLIN-POT CLAVULANATE 875-125 MG PO TABS
1.0000 | ORAL_TABLET | Freq: Two times a day (BID) | ORAL | 0 refills | Status: DC
  Filled 2022-08-08: qty 28, 14d supply, fill #0

## 2022-08-09 ENCOUNTER — Other Ambulatory Visit: Payer: Self-pay

## 2022-08-09 NOTE — Progress Notes (Signed)
[name], [name] ([id number]) [id number] Page 1 of 4 Visit Report for 08/08/2022 Abuse Risk Screen Details Patient Name: Date of Service: [name] IS, [name] 08/08/2022 7:45 A M Medical Record Number: [id number] Patient Account Number: [id number] Date of Birth/Sex: Treating RN: [birth year] (42 y.o. [name] Primary Care Quinto Tippy: Gwinda Passe Other Clinician: Referring Eldora Napp: Treating Tashan Kreitzer/Extender: Grace Isaac in Treatment: 0 Abuse Risk Screen Items Answer ABUSE RISK SCREEN: Has anyone close to you tried to hurt or harm you recentlyo No Do you feel uncomfortable with anyone in your familyo No Has anyone forced you do things that you didnt want to doo No Electronic Signature(s) Signed: 08/09/2022 4:29:32 PM By: Shawn Stall RN, BSN Entered By: Shawn Stall on 08/08/2022 08:01:41 -------------------------------------------------------------------------------- Activities of Daily Living Details Patient Name: Date of Service: [name] V IS, [name] 08/08/2022 7:45 A M Medical Record Number: [id number] Patient Account Number: [id number] Date of Birth/Sex: Treating RN: [birth year] (42 y.o. [name] Primary Care Markel Kurtenbach: Gwinda Passe Other Clinician: Referring [place]: Treating [name]/Extender: Grace Isaac in Treatment: 0 Activities of Daily Living Items Answer Activities of Daily Living (Please select one for each item) Drive Automobile Not Able T Medications ake Completely Able Use T elephone Completely Able Care for Appearance Completely Able Use T oilet Completely Able Bath / Shower Completely Able Dress Self Completely Able Feed Self Completely Able Walk Need Assistance Get In / Out Bed Completely Able Housework Need Assistance Prepare Meals Completely Able Handle Money Need Assistance Shop for Self Need Assistance Electronic Signature(s) Signed:  08/09/2022 4:29:32 PM By: Shawn Stall RN, BSN Entered By: Shawn Stall on 08/07/2022 14:43:51 [place], Sharol Harness ([id number]) [id number] Page 2 of 4 -------------------------------------------------------------------------------- Education Screening Details Patient Name: Date of Service: [name] IS, [place] [name] 08/08/2022 7:45 A M Medical Record Number: [id number] Patient Account Number: [id number] Date of Birth/Sex: Treating RN: [birth year] (42 y.o. [name], [name] Primary Care [name]: Gwinda Passe Other Clinician: Referring [name]: Treating Maytte Jacot/Extender: Grace Isaac in Treatment: 0 Primary Learner Assessed: Patient Learning Preferences/Education Level/Primary Language Learning Preference: Explanation, Demonstration, Printed Material Highest Education Level: College or Above Preferred Language: English Cognitive Barrier Language Barrier: No Translator Needed: No Memory Deficit: No Emotional Barrier: No Cultural/Religious Beliefs Affecting Medical Care: No Physical Barrier Impaired Vision: Yes Glasses Impaired Hearing: No Decreased Hand dexterity: No Knowledge/Comprehension Knowledge Level: High Comprehension Level: High Ability to understand written instructions: High Ability to understand verbal instructions: High Motivation Anxiety Level: Calm Cooperation: Cooperative Education Importance: Acknowledges Need Interest in Health Problems: Asks Questions Perception: Coherent Willingness to Engage in Self-Management High Activities: Readiness to Engage in Self-Management High Activities: Electronic Signature(s) Signed: 08/09/2022 4:29:32 PM By: Shawn Stall RN, BSN Entered By: Shawn Stall on 08/07/2022 14:44:18 -------------------------------------------------------------------------------- Fall Risk Assessment Details Patient Name: Date of Service: [name] V IS, [name] 08/08/2022 7:45 A M Medical  Record Number: [id number] Patient Account Number: [id number] Date of Birth/Sex: Treating RN: [birth year] (42 y.o. [name] Primary Care Presley Summerlin: Gwinda Passe Other Clinician: Referring Dereona Kolodny: Treating Ottie Tillery/Extender: Grace Isaac in Treatment: 0 Fall Risk Assessment Items Have you had 2 or more falls in the last 12 monthso 0 No [name], [name] L ([id number]) [phone] [id number] Page 3 of 4 Have you had any fall that resulted in injury in the last 12 monthso 0 No FALLS RISK SCREEN History of falling - immediate or within 3 months 0 No Secondary  diagnosis (Do you have 2 or more medical diagnoseso) 0 No Ambulatory aid None/bed rest/wheelchair/nurse 0 Yes Crutches/cane/walker 0 No Furniture 0 No Intravenous therapy Access/Saline/Heparin Lock 0 No Gait/Transferring Normal/ bed rest/ wheelchair 0 Yes Weak (short steps with or without shuffle, stooped but able to lift head while walking, may seek 0 No support from furniture) Impaired (short steps with shuffle, may have difficulty arising from chair, head down, impaired 0 No balance) Mental Status Oriented to own ability 0 Yes Electronic Signature(s) Signed: 08/09/2022 4:29:32 PM By: Shawn Stall RN, BSN Entered By: Shawn Stall on 08/08/2022 08:02:04 -------------------------------------------------------------------------------- Foot Assessment Details Patient Name: Date of Service: [name] V IS, [name] 08/08/2022 7:45 A M Medical Record Number: [id number] Patient Account Number: [id number] Date of Birth/Sex: Treating RN: 07/30/1980 (42 y.o. [name] Primary Care [name]: Gwinda Passe Other Clinician: Referring [name]: Treating [name]/Extender: [name] Weeks in Treatment: 0 Foot Assessment Items Site Locations + = Sensation present, - = Sensation absent, C = Callus, U = Ulcer R = Redness, W = Warmth, M = Maceration, PU  = Pre-ulcerative lesion F = Fissure, S = Swelling, D = Dryness Assessment Right: Left: Other Deformity: No No Prior Foot Ulcer: No No Prior Amputation: No No Charcot Joint: No No Ambulatory Status: Ambulatory With Help Assistance Device: FRYDA, PEINADO ([id number]) [phone] [id number] Page 4 of 4 Gait: Steady Notes uses wheelchair mostly for mobility to keep pressure off right foot. Electronic Signature(s) Signed: 08/09/2022 4:29:32 PM By: Shawn Stall RN, BSN Entered By: Shawn Stall on 08/08/2022 08:03:08 -------------------------------------------------------------------------------- Nutrition Risk Screening Details Patient Name: Date of Service: [name] V IS, [name] 08/08/2022 7:45 A M Medical Record Number: [id number] Patient Account Number: [id number] Date of Birth/Sex: Treating RN: 17-Oct-1980 (42 y.o. [name] Primary Care Nikeisha Klutz: Gwinda Passe Other Clinician: Referring Delisha Peaden: Treating Henretter Piekarski/Extender: [name] Weeks in Treatment: 0 Height (in): 69 Weight (lbs): Body Mass Index (BMI): Nutrition Risk Screening Items Score Screening NUTRITION RISK SCREEN: I have an illness or condition that made me change the kind and/or amount of food I eat 2 Yes I eat fewer than two meals per day 0 No I eat few fruits and vegetables, or milk products 0 No I have three or more drinks of beer, liquor or wine almost every day 0 No I have tooth or mouth problems that make it hard for me to eat 0 No I don't always have enough money to buy the food I need 0 No I eat alone most of the time 0 No I take three or more different prescribed or over-the-counter drugs a day 1 Yes Without wanting to, I have lost or gained 10 pounds in the last six months 0 No I am not always physically able to shop, cook and/or feed myself 0 No Nutrition Protocols Good Risk Protocol Provide education on elevated blood Moderate Risk  Protocol 0 sugars and impact on wound healing, as applicable High Risk Proctocol Risk Level: Moderate Risk Score: 3 Electronic Signature(s) Signed: 08/09/2022 4:29:32 PM By: Shawn Stall RN, BSN Entered By: Shawn Stall on 08/07/2022 14:44:40

## 2022-08-09 NOTE — Progress Notes (Signed)
[name] ([id number]) [id number] Page 1 of 10 Visit Report for 08/08/2022 Allergy List Details Patient Name: Date of Service: [name] IS, [name]. 08/08/2022 7:45 A M Medical Record Number: [id number] Patient Account Number: [id number] Date of Birth/Sex: Treating RN: 1980-12-04 (42 y.o. [name] Ereka Brau: Gwinda Passe Other Clinician: Referring Livian Vanderbeck: Treating Izora Benn/Extender: Tobie Poet Weeks in [name]: 0 Allergies Active Allergies No Known Drug Allergies Allergy Notes Electronic Signature(s) Signed: 08/09/2022 4:29:32 PM By: Shawn Stall RN, BSN Entered By: Shawn Stall on 08/08/2022 08:01:17 -------------------------------------------------------------------------------- Arrival Information Details Patient Name: Date of Service: [name] V IS, [name]. 08/08/2022 7:45 A M Medical Record Number: [id number] Patient Account Number: [id number] Date of Birth/Sex: Treating RN: September 11, 1980 (42 y.o. [name], [name] Primary [name] Amber Guthridge: Gwinda Passe Other Clinician: Referring Aarica Wax: Treating Rielle Schlauch/Extender: [name]: 0 Visit Information Patient Arrived: Wheel Chair Arrival Time: 07:58 Accompanied By: mother Transfer Assistance: None Patient Identification Verified: Yes Secondary Verification Process Completed: Yes Patient Requires Transmission-Based Precautions: No Patient Has Alerts: Yes Patient Alerts: Patient on Blood Thinner Eliquis History Since Last Visit Added or deleted any medications: No Any new allergies or adverse reactions: No Had a fall or experienced change in activities of daily living that may affect risk of falls: No Signs or symptoms of abuse/neglect since last visito No Hospitalized since last visit: No Implantable device outside of the clinic excluding cellular tissue based products placed in the center since last  visit: No Has Dressing in Place as Prescribed: Yes Electronic Signature(s) Signed: 08/09/2022 4:29:32 PM By: Shawn Stall RN, BSN Entered By: Shawn Stall on 08/08/2022 08:25:22 [name], [name] ([id number]) [id number] Page 2 of 10 -------------------------------------------------------------------------------- Clinic Level of [name] Assessment Details Patient Name: Date of Service: [name] [name], [name]. 08/08/2022 7:45 A M Medical Record Number: [id number] Patient Account Number: [id number] Date of Birth/Sex: Treating RN: 05-Jan-1981 (42 y.o. [name] Lanae Federer: Gwinda Passe Other Clinician: Referring Cierah Crader: Treating Dannah Ryles/Extender: [name]: 0 Clinic Level of [name] Assessment Items TOOL 1 Quantity Score X- 1 0 Use when EandM and Procedure [name] performed on INITIAL visit ASSESSMENTS - Nursing Assessment / Reassessment X- 1 20 General Physical Exam (combine w/ comprehensive assessment (listed just below) when performed on new pt. evals) X- 1 25 Comprehensive Assessment (HX, ROS, Risk Assessments, Wounds Hx, etc.) ASSESSMENTS - Wound and Skin Assessment / Reassessment X- 1 10 Dermatologic / Skin Assessment (not related to wound area) ASSESSMENTS - Ostomy and/or Continence Assessment and [name] []  - 0 Incontinence Assessment and Management []  - 0 Ostomy [name] Assessment and Management (repouching, etc.) PROCESS - Coordination of [name] X - Simple Patient / Family Education for ongoing [name] 1 15 []  - 0 Complex (extensive) Patient / Family Education for ongoing [name] X- 1 10 Staff obtains Chiropractor, Records, T Results / Process Orders est []  - 0 Staff telephones HHA, Nursing Homes / Clarify orders / etc []  - 0 Routine Transfer to another Facility (non-emergent condition) []  - 0 Routine Hospital Admission (non-emergent condition) X- 1 15 New Admissions / Manufacturing engineer / Ordering  NPWT Apligraf, etc. , []  - 0 Emergency Hospital Admission (emergent condition) PROCESS - Special Needs []  - 0 Pediatric / Minor Patient Management []  - 0 Isolation Patient Management []  - 0 Hearing / Language / Visual special needs []  - 0 Assessment of Community assistance (transportation, D/C planning, etc.) []  - 0 Additional  assistance / Altered mentation []  - 0 Support Surface(s) Assessment (bed, cushion, seat, etc.) INTERVENTIONS - Miscellaneous []  - 0 External ear exam []  - 0 Patient Transfer (multiple staff / Nurse, adult / Similar devices) []  - 0 Simple Staple / Suture removal (25 or less) []  - 0 Complex Staple / Suture removal (26 or more) []  - 0 Hypo/Hyperglycemic Management (do not check if billed separately) X- 1 15 Ankle / Brachial Index (ABI) - do not check if billed separately Has the patient been seen at the hospital within the last three years: Yes Total Score: 110 Level Of [name]: New/Established - Level 3 Electronic Signature(s) Signed: 08/09/2022 4:29:32 PM By: Shawn Stall RN, BSN Entered By: Shawn Stall on 08/08/2022 09:14:43 Sprankle, [name] ([id number]) [id number] Page 3 of 10 -------------------------------------------------------------------------------- Encounter Discharge Information Details Patient Name: Date of Service: [name] [name], [name]. 08/08/2022 7:45 A M Medical Record Number: [id number] Patient Account Number: [id number] Date of Birth/Sex: Treating RN: 07-28-1980 (42 y.o. [name] Kashmere Daywalt: Gwinda Passe Other Clinician: Referring Angelus Hoopes: Treating Deandre Brannan/Extender: [name]: 0 Encounter Discharge Information Items Post Procedure Vitals Discharge Condition: Stable Temperature (F): 98.7 Ambulatory Status: Wheelchair Pulse (bpm): 94 Discharge Destination: Home Respiratory Rate (breaths/min): 18 Transportation: Private Auto Blood Pressure  (mmHg): 139/84 Accompanied By: self Schedule Follow-up Appointment: Yes Clinical Summary of [name]: Electronic Signature(s) Signed: 08/09/2022 4:29:32 PM By: Shawn Stall RN, BSN Entered By: Shawn Stall on 08/08/2022 09:16:14 -------------------------------------------------------------------------------- Lower Extremity Assessment Details Patient Name: Date of Service: [name] V IS, [name]. 08/08/2022 7:45 A M Medical Record Number: [id number] Patient Account Number: [id number] Date of Birth/Sex: Treating RN: 09-12-1980 (42 y.o. [name], [name] Primary [name] Willliam Pettet: Gwinda Passe Other Clinician: Referring Ari Bernabei: Treating Zyann Mabry/Extender: Tobie Poet Weeks in [name]: 0 Edema Assessment Assessed: [Left: No] [Right: Yes] Edema: [Left: Ye] [Right: s] Calf Left: Right: Point of Measurement: 31 cm From Medial Instep 51 cm Ankle Left: Right: Point of Measurement: 9 cm From Medial Instep 28 cm Knee To Floor Left: Right: From Medial Instep 41 cm Vascular Assessment Pulses: Dorsalis Pedis Palpable: [Right:Yes] Doppler Audible: [Right:Yes] Posterior Tibial Palpable: [Right:Yes] Doppler Audible: [Right:Yes] Extremity colors, hair growth, and conditions: Extremity Color: [Right:Hyperpigmented] Sikorski, Cashae [name] ([id number]) [Right:[id number] Page 4 of 10] Hair Growth on Extremity: [Right:No] Temperature of Extremity: [Right:Warm] Capillary Refill: [Right:< 3 seconds] Dependent Rubor: [Right:No] Blanched when Elevated: [Right:No] 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 08/08/2022 7:45 A M Medical Record Number: [id number] Patient Account Number: [id number] Date of Birth/Sex: Treating RN: [birth year] (42 y.o. F) Primary [name] Amalio Loe: Gwinda Passe Other Clinician: Referring Lilas Diefendorf: Treating Lonita Debes/Extender: [name]: 0 Vital Signs Height(in): Pulse(bpm): 94 Weight(lbs): Blood Pressure(mmHg): 139/84 Body Mass Index(BMI): Temperature(F): 98.7 Respiratory Rate(breaths/min): 18 [4:Photos:] [N/A:N/A] Right Amputation Site - N/A N/A Wound Location: Transmetatarsal Shear/Friction N/A N/A Wounding Event: Diabetic Wound/Ulcer of the Lower N/A N/A Primary Etiology: Extremity Dehisced Wound N/A N/A Secondary Etiology: Deep Vein Thrombosis, Hypertension, N/A N/A Comorbid History: Type II Diabetes, Osteomyelitis, Neuropathy 07/08/2022 N/A N/A Date Acquired: 0 N/A N/A Weeks of [name]: Open N/A N/A Wound Status: No N/A N/A Wound Recurrence: 1.4x0.2x0.8 N/A N/A Measurements [name] x W x D (cm) 0.22 N/A N/A  A (cm) : rea 0.176 N/A N/A Volume (cm) : Grade 3 N/A N/A Classification: Medium N/A N/A Exudate A mount: Serosanguineous N/A N/A Exudate Type: red, brown N/A N/A Exudate Color: Distinct, outline attached N/A N/A Wound Margin: Large (67-100%) N/A N/A Granulation A mount: None Present (0%) N/A N/A Necrotic A mount: Fat Layer (Subcutaneous Tissue): Yes N/A N/A Exposed Structures: Fascia: No Tendon: No Muscle: No Shepherd, Quin [name] ([id number]) [id number] Page 5 of 10 Joint: No Bone: No None N/A N/A Epithelialization: Debridement - Excisional N/A N/A Debridement: Pre-procedure Verification/Time Out 09:05 N/A N/A Taken: Lidocaine 4% Topical Solution N/A N/A Pain Control: Callus, Subcutaneous, Slough N/A N/A Tissue Debrided: Skin/Subcutaneous Tissue N/A N/A Level: 0.88 N/A N/A Debridement A (sq cm): rea Curette N/A N/A Instrument: Minimum  N/A N/A Bleeding: Pressure N/A N/A Hemostasis A chieved: 0 N/A N/A Procedural Pain: 0 N/A N/A Post Procedural Pain: Procedure was tolerated well N/A N/A Debridement [name] Response: 1.4x0.8x0.8 N/A N/A Post Debridement Measurements [name] x W x D (cm) 0.704 N/A N/A Post Debridement Volume: (cm) Callus: Yes N/A N/A Periwound Skin Texture: Excoriation: No Induration: No Crepitus: No Rash: No Scarring: No Maceration: No N/A N/A Periwound Skin Moisture: Dry/Scaly: No Atrophie Blanche: No N/A N/A Periwound Skin Color: Cyanosis: No Ecchymosis: No Erythema: No Hemosiderin Staining: No Mottled: No Pallor: No Rubor: No Yes N/A N/A Tenderness on Palpation: Debridement N/A N/A Procedures Performed: [name] Notes Wound #4 (Amputation Site - Transmetatarsal) Wound Laterality: Right Cleanser Peri-Wound [name] Topical Primary Dressing Dakin's Solution 0.25%, 16 (oz) Discharge Instruction: Moisten gauze with Dakin's solution Secondary Dressing ABD Pad, 8x10 Discharge Instruction: Apply over primary dressing as directed. Secured With [employer], 4.5x3.1 (in/yd) Discharge Instruction: Secure with Kerlix as directed. 24M Medipore H Soft Cloth Surgical T ape, 4 x 10 (in/yd) Discharge Instruction: Secure with tape as directed. ace wrap Discharge Instruction: apply lightly to hold dressing in place. Compression Wrap Compression Stockings Add-Ons Electronic Signature(s) Signed: 08/08/2022 12:37:31 PM By: Geralyn Corwin DO Entered By: Geralyn Corwin on 08/08/2022 11:23:46 Barney, [name] ([id number]) [id number] Page 6 of 10 -------------------------------------------------------------------------------- Multi-Disciplinary [name] Plan Details Patient Name: Date of Service: [name] [name], [name]. 08/08/2022 7:45 A M Medical Record Number: [id number] Patient Account Number: [id number] Date of Birth/Sex: Treating RN: 29-Oct-1980 (42 y.o. [name],  [name] Primary [name] Hannan Hutmacher: Gwinda Passe Other Clinician: Referring Toren Tucholski: Treating Gillermo Poch/Extender: [name]: 0 Active Inactive Necrotic Tissue Nursing Diagnoses: Knowledge deficit related to management of necrotic/devitalized tissue Goals: Necrotic/devitalized tissue will be minimized in the wound bed Date Initiated: 08/08/2022 Target Resolution Date: 08/23/2022 Goal Status: Active Patient/caregiver will verbalize understanding of reason and process for debridement of necrotic tissue Date Initiated: 08/08/2022 Target Resolution Date: 08/23/2022 Goal Status: Active Interventions: Assess patient pain level pre-, during and post procedure and prior to discharge Provide education on necrotic tissue and debridement process Notes: Nutrition Nursing Diagnoses: Impaired glucose control: actual or potential Goals: Patient/caregiver agrees to and verbalizes understanding of need to obtain nutritional consultation Date Initiated: 08/08/2022 Target Resolution Date: 08/23/2022 Goal Status: Active Patient/caregiver will maintain therapeutic glucose control Date Initiated: 08/08/2022 Target Resolution Date: 08/23/2022 Goal Status: Active Interventions: Assess HgA1c results as ordered upon admission and as needed Provide education on elevated blood sugars and impact on wound healing Provide education on nutrition [name] Activities: Obtain HgA1c : 08/08/2022 Patient referred to Primary [name] Physician for further nutritional evaluation : 08/08/2022 Notes: Orientation to the Wound [name] Program Nursing Diagnoses: Knowledge deficit  related to the wound healing center program Goals: Patient/caregiver will verbalize understanding of the Wound Healing Center Program Date Initiated: 08/08/2022 Target Resolution Date: 08/23/2022 Goal Status: Active Interventions: Provide education on orientation to the wound center Notes: [name], [name]  ([id number]) [phone].pdf Page 7 of 10 Pain, Acute or Chronic Nursing Diagnoses: Pain, acute or chronic: actual or potential Potential alteration in comfort, pain Goals: Patient will verbalize adequate pain control and receive pain control interventions during procedures as needed Date Initiated: 08/08/2022 Target Resolution Date: 08/23/2022 Goal Status: Active Patient/caregiver will verbalize comfort level met Date Initiated: 08/08/2022 Target Resolution Date: 08/23/2022 Goal Status: Active Interventions: Complete pain assessment as per visit requirements Encourage patient to take pain medications as prescribed Provide education on pain management [name] Activities: Administer pain control measures as ordered : 08/08/2022 Notes: Wound/Skin Impairment Nursing Diagnoses: Knowledge deficit related to ulceration/compromised skin integrity Goals: Patient/caregiver will verbalize understanding of skin [name] regimen Date Initiated: 08/08/2022 Target Resolution Date: 08/23/2022 Goal Status: Active Ulcer/skin breakdown will heal within 14 weeks Date Initiated: 08/08/2022 Target Resolution Date: 10/18/2022 Goal Status: Active Interventions: Assess patient/caregiver ability to perform ulcer/skin [name] regimen upon admission and as needed Assess ulceration(s) every visit Provide education on ulcer and skin [name] [name] Activities: Skin [name] regimen initiated : 08/08/2022 Topical wound management initiated : 08/08/2022 Notes: Electronic Signature(s) Signed: 08/09/2022 4:29:32 PM By: Shawn Stall RN, BSN Entered By: Shawn Stall on 08/08/2022 08:49:04 -------------------------------------------------------------------------------- Pain Assessment Details Patient Name: Date of Service: [name] V IS, [name]. 08/08/2022 7:45 A M Medical Record Number: [id number] Patient Account Number: [id number] Date of Birth/Sex: Treating RN: June 29, 1980 (42 y.o. [name] Primary  [name] Johnny Gorter: Gwinda Passe Other Clinician: Referring Emmogene Simson: Treating Samra Pesch/Extender: [name]: 0 Active Problems Location of Pain Severity and Description of Pain Patient Has Paino Yes Site Locations Pain LocationJAMILET, [name] ([id number]) [phone].pdf Page 8 of 10 Pain Location: Pain in Ulcers Rate the pain. Current Pain Level: 7 Pain Management and Medication Current Pain Management: Medication: No Cold Application: No Rest: No Massage: No Activity: No T.E.N.S.: No Heat Application: No Leg drop or elevation: No [name] the Current Pain Management Adequate: Adequate How does your wound impact your activities of daily livingo Sleep: No Bathing: No Appetite: No Relationship With Others: No Bladder Continence: No Emotions: No Bowel Continence: No Work: No Toileting: No Drive: No Dressing: No Hobbies: No Psychologist, prison and probation services) Signed: 08/09/2022 4:29:32 PM By: Shawn Stall RN, BSN Entered By: Shawn Stall on 08/08/2022 08:02:20 -------------------------------------------------------------------------------- Patient/Caregiver Education Details Patient Name: Date of Service: [name] [name], [name]. 7/25/2024andnbsp7:45 A M Medical Record Number: [id number] Patient Account Number: [id number] Date of Birth/Gender: Treating RN: 1980-07-11 (42 y.o. [name] Physician: Gwinda Passe Other Clinician: Referring Physician: Treating Physician/Extender: [name]: 0 Education Assessment Education Provided To: Patient Education Topics Provided Wound/Skin Impairment: Handouts: Caring for Your Ulcer Methods: Explain/Verbal Responses: Reinforcements needed Electronic Signature(s) Signed: 08/09/2022 4:29:32 PM By: Shawn Stall RN, BSN Entered By: Shawn Stall on 08/08/2022 08:49:12 Gilmartin, [name] ([id number])  [id number] Page 9 of 10 -------------------------------------------------------------------------------- Wound Assessment Details Patient Name: Date of Service: [name] IS, [name]. 08/08/2022 7:45 A M Medical Record Number: [id number] Patient Account Number: [id number] Date of Birth/Sex: Treating RN: 21-Nov-1980 (42 y.o. [name] Emmary Culbreath: Gwinda Passe Other Clinician: Referring Shadoe Cryan: Treating Janira Mandell/Extender: Tobie Poet Weeks in [name]: 0 Wound Status Wound Number: 4  Primary Diabetic Wound/Ulcer of the Lower Extremity Etiology: Wound Location: Right Amputation Site - Transmetatarsal Secondary Dehisced Wound Wounding Event: Shear/Friction Etiology: Date Acquired: 07/08/2022 Wound Status: Open Weeks Of [name]: 0 Comorbid Deep Vein Thrombosis, Hypertension, Type II Diabetes, Clustered Wound: No History: Osteomyelitis, Neuropathy Photos Wound Measurements Length: (cm) 1.4 Width: (cm) 0.2 Depth: (cm) 0.8 Area: (cm) 0.22 Volume: (cm) 0.176 % Reduction in Area: % Reduction in Volume: Epithelialization: None Tunneling: No Undermining: No Wound Description Classification: Grade 3 Wound Margin: Distinct, outline attached Exudate Amount: Medium Exudate Type: Serosanguineous Exudate Color: red, brown Foul Odor After Cleansing: No Slough/Fibrino No Wound Bed Granulation Amount: Large (67-100%) Exposed Structure Necrotic Amount: None Present (0%) Fascia Exposed: No Fat Layer (Subcutaneous Tissue) Exposed: Yes Tendon Exposed: No Muscle Exposed: No Joint Exposed: No Bone Exposed: No Periwound Skin Texture Texture Color No Abnormalities Noted: No No Abnormalities Noted: No Callus: Yes Atrophie Blanche: No Crepitus: No Cyanosis: No Excoriation: No Ecchymosis: No Induration: No Erythema: No Rash: No Hemosiderin Staining: No Scarring: No Mottled: No Pallor: No Moisture Rubor:  No No Abnormalities Noted: No Dry / Scaly: No Temperature / Pain Maceration: No Tenderness on Palpation: Yes [name], [name] ([id number]) [id number] Page 10 of 10 [name] Notes Wound #4 (Amputation Site - Transmetatarsal) Wound Laterality: Right Cleanser Peri-Wound [name] Topical Primary Dressing Dakin's Solution 0.25%, 16 (oz) Discharge Instruction: Moisten gauze with Dakin's solution Secondary Dressing ABD Pad, 8x10 Discharge Instruction: Apply over primary dressing as directed. Secured With [employer], 4.5x3.1 (in/yd) Discharge Instruction: Secure with Kerlix as directed. 54M Medipore H Soft Cloth Surgical T ape, 4 x 10 (in/yd) Discharge Instruction: Secure with tape as directed. ace wrap Discharge Instruction: apply lightly to hold dressing in place. Compression Wrap Compression Stockings Add-Ons Electronic Signature(s) Signed: 08/09/2022 4:29:32 PM By: Shawn Stall RN, BSN Entered By: Shawn Stall on 08/08/2022 09:12:38 -------------------------------------------------------------------------------- Vitals Details Patient Name: Date of Service: [name] V IS, [name]. 08/08/2022 7:45 A M Medical Record Number: [id number] Patient Account Number: [id number] Date of Birth/Sex: Treating RN: 06-01-1980 (42 y.o. [name] Primary [name] Rebacca Votaw: Gwinda Passe Other Clinician: Referring Arzella Rehmann: Treating Lissandra Keil/Extender: [name]: 0 Vital Signs Time Taken: 08:15 Temperature (F): 98.7 Pulse (bpm): 94 Respiratory Rate (breaths/min): 18 Blood Pressure (mmHg): 139/84 Reference Range: 80 - 120 mg / dl Electronic Signature(s) Signed: 08/08/2022 4:00:15 PM By: Redmond Pulling RN, BSN Entered By: Redmond Pulling on 08/08/2022 08:16:16

## 2022-08-09 NOTE — Progress Notes (Signed)
[name], [name] ([id number]) [id number] Page 1 of 11 Visit Report for 08/08/2022 Chief Complaint Document Details Patient Name: Date of Service: [name], [place] [name]. 08/08/2022 7:45 A M Medical Record Number: [id number] Patient Account Number: [id number] Date of Birth/Sex: Treating RN: [birth year] (42 y.o. F) Primary Care Provider: Gwinda Passe Other Clinician: Referring Provider: Treating Provider/Extender: [name] in Treatment: 0 Information Obtained from: Patient Chief Complaint 01/22/2021; Osteomyelitis of the right foot status post transmetatarsal amputation with surgical site dehiscence 08/08/2022; reopening of wound to the right foot with a history of transmetatarsal amputation with previous difficulty healing surgical site Electronic Signature(s) Signed: 08/08/2022 12:37:31 PM By: [name] Corwin DO Entered By: [name] on 08/08/2022 11:24:39 -------------------------------------------------------------------------------- [name] Details Patient Name: Date of Service: [name], [name] [name]. 08/08/2022 7:45 A M Medical Record Number: [id number] Patient Account Number: [id number] Date of Birth/Sex: Treating RN: [birth year] (42 y.o. [name], [name] Primary Care Provider: Gwinda Passe Other Clinician: Referring Provider: Treating Provider/Extender: [name] in Treatment: 0 [name]: Wound #4 Right Amputation Site - Transmetatarsal Performed By: Physician [name] Corwin, DO [name] Type: [name] Severity of Tissue Pre [name]: Fat layer exposed Level of Consciousness (Pre-procedure): Awake and Alert Pre-procedure Verification/Time Out Yes - 09:05 Taken: Start Time: 09:06 Pain Control: Lidocaine 4% T opical Solution Percent of Wound Bed Debrided: 100% T Area Debrided (cm): otal 0.88 Tissue and other material debrided: Viable,  Non-Viable, Callus, Slough, Subcutaneous, Skin: Dermis , Skin: Epidermis, Slough Level: Skin/Subcutaneous Tissue [name] Description: Excisional Instrument: Curette Bleeding: Minimum Hemostasis Achieved: Pressure End Time: 09:13 Procedural Pain: 0 Post Procedural Pain: 0 Response to Treatment: Procedure was tolerated well Level of Consciousness (Post- Awake and Alert procedure): Post [name] Measurements of Total Wound Length: (cm) 1.4 Width: (cm) 0.8 Depth: (cm) 0.8 Volume: (cm) 0.704 [name], [name] ([id number]) [id number] Page 2 of 11 Character of Wound/Ulcer Post [name]: Improved Severity of Tissue Post [name]: Fat layer exposed Post Procedure Diagnosis Same as Pre-procedure Electronic Signature(s) Signed: 08/08/2022 12:37:31 PM By: [name] Corwin DO Signed: 08/09/2022 4:29:32 PM By: [name] Stall RN, BSN Entered By: [name] on 08/08/2022 09:14:02 -------------------------------------------------------------------------------- HPI Details Patient Name: Date of Service: [name], [name] [name]. 08/08/2022 7:45 A M Medical Record Number: [id number] Patient Account Number: [id number] Date of Birth/Sex: Treating RN: [birth year] (42 y.o. F) Primary Care Provider: Gwinda Passe Other Clinician: Referring Provider: Treating Provider/Extender: [name] in Treatment: 0 History of Present Illness HPI Description: Admission 01/22/2021 [name] [name] a 42 year old female with a past medical history of insulin-dependent uncontrolled type 2 diabetes with last hemoglobin A1c of 13.5, osteomyelitis of the right foot status post transmetatarsal amputation on 12/18/2020 that presents to the clinic for right foot wound. She has had dehiscence of the surgical site. [name] using wet-to-dry dressings. She has a PICC line and receiving IV ceftriaxone daily for her osteomyelitis. There [name] an end date of  01/27/2021. [name] also taking oral metronidazole. She currently denies systemic signs of infection. 1/19; patient presents for follow-up. She was diagnosed with a DVT to the right lower extremity 2 days ago. [name] on Eliquis now. [name] scheduled to see her infectious disease doctor tomorrow. She has been using Dakin's wet-to-dry dressings. She denies systemic signs of infection. 1/26; patient presents for follow-up. She saw infectious disease on 1/21 started on Augmentin. Her PICC line and IV ceftriaxone was discontinued. Patient reports stability to  her wound. She has been using Dakin's wet-to-dry dressings. She currently denies systemic signs of infection. 2/3; patient presents for follow-up. She continues to use Dakin's wet-to-dry dressings to the wound bed. She saw [name] with infectious disease yesterday and [name] continuing Augmentin. T entative end date [name] 2/16. Patient reports following up with orthopedics. She states there [name] no further plan from them. She currently denies systemic signs of infection. 2/10; patient presents for follow-up. She continues to use Dakin's wet to dry dressings. [name] scheduled to have her MRI done on 2/14. She states that she had pain to the [name] site from last clinic visit and declines [name] today. She denies systemic signs of infection. She continues to have yellow thick drainage. 2/20; patient presents for follow-up. She continues to use Dakin's wet-to-dry dressings. She obtained her MRI. The results showed an abscess and [name] scheduled to see her orthopedic surgeon on 2/23. She saw infectious disease 2/17 and her antibiotics were extended. She currently denies systemic signs of infection. 3/6; patient presents for follow-up. She had [name] and irrigation of her foot on 03/10/2021 due to abscess noted on MRI. She was started on IV ceftriaxone and oral Flagyl. She has no issues or complaints today. She has been using iodoform packing to  the tunnel and Dakin's wet-to-dry to the opening. 03/26/2021: She continues on IV ceftriaxone and oral metronidazole. She has follow-up with infectious disease tomorrow. No significant issues or complaints today. Her mother continues to help her with her wound dressing, using iodoform packing strips into the tunnel and Dakin's to the open portion of the wound. 3/20; patient presents for follow-up. She continues to be on IV ceftriaxone in oral metronidazole. She has been using iodoform to the tunnel and Dakin's wet-to- dry to the open wound. She denies signs of infection. 3/27; patient presents for follow-up. She no longer has a PICC line. She has been using iodoform to the tunnel and Dakin's wet-to-dry to the open wound. She reports improvement in wound healing. She denies signs of infection. 4/3; patient presents for follow-up. She states she has been using Hydrofera Blue to the open wound and iodoform packing to the tunnel without any issues. She denies signs of infection. 4/18; patient presents for follow-up. She saw infectious disease on 4/11. She has finished her oral antibiotics and completed a total of 6 weeks of antibiotics (this includes IV as well). No further antibiotics needed. She has been using Hydrofera Blue and iodoform packing. She states that the tunneled area has come in and the iodoform [name] not staying in place anymore. She has no issues or complaints today. She denies signs of infection. 4/24; patient presents for follow-up. She saw Dr. Carlene Coria, plastic surgery to discuss potential skin graft/substitute placement. At this time he thinks that the skin graft would likely not take. He [name] in agreement with trying a wound VAC. Patient has been using Hydrofera Blue dressing changes with no issues. She denies signs of infection. She reports improvement in wound healing. 5/1; patient presents for follow-up. Unfortunately patient did not have insurance when we ran for the pico. There [name] an  assistance program and we are trying to get this accommodated for the patient. In the meantime she has been using Hydrofera Blue without any issues. She denies signs of infection. 5/8; patient presents for follow-up. We have not heard back if pico [name] covered by her insurance. She has been using collagen to the wound bed over the past week. She denies  signs of infection. JAQUELINE, LEYS ([id number]) [id number] Page 3 of 11 5/18; patient presents for follow-up. She has been using collagen to the wound bed without issues. Again we have not heard if pico [name] covered by her insurance. She has no issues or complaints today. 5/23; patient presents for follow-up. She has been using collagen to the wound bed. She has no issues or complaints today. She obtained the wound VAC from [facility] and brought this in today. She denies signs of infection. 6/1; patient presents for follow-up. She has been using the wound VAC for the past week. She has had this changed twice since she was last here. She reports more maceration to the periwound. She denies signs of infection. 6/7; right TMA site. There are 2 wounds 0 separated by a bridge of healed tissue. The more lateral area has undermining. Both areas have healthy looking granulation at the base but relative the size of the wound [name] fairly deep. There [name] no exposed bone no evidence of infection. Her wound VAC was put on hold last week because of surrounding skin maceration she has been using collagen this week. She has a modified shoe 6/12; patient presents for follow-up. Last week the wound VAC was reinitiated. She had no issues with the wound VAC itself. Today she has maceration again noted to the surrounding skin. She denies signs of infection. 6/27; patient presents for follow-up. She has been using Medihoney to the wound bed. We took a break from the wound VAC because the periwound was macerated. She still has some areas of maceration to the  distal foot where there [name] a callus. She currently denies signs of infection. 7/11; patient presents for follow-up. She has been using Medihoney to the wound bed. She has developed some increased warmth and erythema to the lateral aspect of the right foot. She states this [name] occurred over the past week and there [name] increased pain. No drainage noted. 7/17; patient presents for follow-up. She has been using Medihoney to the wound bed. She completed her course of Bactrim. She reports improvement in symptoms. 7/24; Patient presents for follow up. She has been using Medihoney to the wound bed without issues. She completed another course of Bactrim. She reports improvement in her symptoms but still has some mild tenderness to the medial aspect of the foot. 7/31; Patient presents for follow-up. She has been using Medihoney and Dakin's to the wound bed. She denies signs of infection. 8/14; patient presents for follow-up. She has been using Dakin's wet-to-dry packing strips to the right medial aspect of the amputation site and Medihoney to the anterior site. She has no issues or complaints today. She has started physical therapy. She denies signs of infection. 8/29; patient presents for follow-up. She has been using Dakin's wet-to-dry packing strips to the right medial aspect of the amputation site however this [name] becoming more difficult to place. She did report that she had increased redness and swelling to that site and developed some drainage. It has resolved. She continues with physical therapy. 9/8; patient presents for follow-up. We have been using silver alginate to the tunneled area. She completed her course of antibiotics. She reports no pain, increased swelling or erythema. 9/21; patient presents for follow-up. She has been using gentamicin to the tunneled area. She has no issues or complaints today. She denies signs of infection. 10/2; patient presents for follow-up. [name] scheduled to have her CT  scan on 10/9. She currently denies signs of infection. She denies  increased warmth, erythema or purulent drainage from the wound bed. She has been using Dakin's wet-to-dry dressings. 10/12; patient presents for follow-up. She had her CT scan on 10/9 that showed A small irregular rim-enhancing fluid pocket communicating to the overlying soft tissues of the sinus tract compatible with a small abscess. Currently she denies systemic signs of infection. She has been doing Dakin's wet-to-dry packing strips but it [name] hard for her to pack into the narrow opening. 10/30; patient presents for follow-up. Since last clinic visit she has had OR [name] of her Right foot by Dr. Odis Hollingshead Due to abscess noted on CT. She has been using iodoform packing. 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 She will complete 2 weeks of this and continue with oral amoxicillin for the next 6 to 12 months. She follows with Dr. Luciana Axe for this. She currently denies signs of infection. 11/13; patient presents for follow-up. Patient has been using silver alginate with gentamicin to the wound bed. She has no issues or complaints today she. She reports improvement in wound healing. 12/4; patient presents for follow-up. She has been using silver alginate and gentamicin to the wound bed. She has no issues or complaints today. 12/8; patient presents for follow-up. She has been using silver alginate with gentamicin to the wound bed. She presents for her first cast placement. She will be back early next week for her obligatory cast change. 12/12; patient presents for follow-up. We have been using collagen with antibiotic ointment to the wound bed under a total contact cast. She has tolerated this well. [name] improved in wound healing. 12/18; patient presents for follow-up. We have been using collagen with antibiotic ointment under the total contact cast. She has no complaints today. 1/2; The cast  was placed at last clinic visit however patient missed her follow-up and this was taken off last week at a nurse visit. Patient has been using packing strips to the medial narrowed wound. She has noted no drainage. 1/22; patient presents for follow-up. She has been keeping the area covered. She reports no drainage. She has had no issues to the previous wound site. She denies any signs of infection including increased warmth, erythema or purulent drainage. 08/08/2022 [name] [name] a 42 year old female with a past medical history of insulin-dependent uncontrolled type 2 diabetes with last hemoglobin A1c of 8.8, chronic osteomyelitis of the right foot status post transmetatarsal amputation on 12/18/2020 that has been previously treated for surgical dehiscence of this site in our clinic. She presents today 6 months after discharge from our clinic due to healed right foot wound with now reopening of the previous surgical site. She reports chronic pain to the site however denies purulent drainage, increased warmth or erythema. She noticed the wound opening about 1 month ago. She has been doing physical therapy without significant issues. She has inserts to her shoe however she has been advised to follow-up with [employer] for new custom inserts by her orthopedic surgeon now that she has a new wound. She states she will try and do this. She has been using antibiotic ointment to the wound bed. Electronic Signature(s) Signed: 08/08/2022 12:37:31 PM By: [name] Corwin DO Entered By: [name] on 08/08/2022 11:27:05 [name], [name] ([id number]) [id number] Page 4 of 11 -------------------------------------------------------------------------------- Physical Exam Details Patient Name: Date of Service: [name] [name], [name] [name]. 08/08/2022 7:45 A M Medical Record Number: [id number] Patient Account Number: [id number] Date of Birth/Sex: Treating RN: 1980/07/15 (  42 y.o. F) Primary  Care Provider: Gwinda Passe Other Clinician: Referring Provider: Treating Provider/Extender: [name] in Treatment: 0 Constitutional respirations regular, non-labored and within target range for patient.. Cardiovascular 2+ dorsalis pedis/posterior tibialis pulses. Psychiatric pleasant and cooperative. Notes T the right foot there [name] an open wound Along the transmetatarsal up previous amputation site with nonviable tissue and postdebridement granulation tissue o present. Does not probe to bone. Mild tenderness on palpation. No purulent drainage or fluctuance noted. No increased warmth or erythema. Electronic Signature(s) Signed: 08/08/2022 12:37:31 PM By: [name] Corwin DO Entered By: [name] on 08/08/2022 11:28:04 -------------------------------------------------------------------------------- Physician Orders Details Patient Name: Date of Service: [name], [name] [name]. 08/08/2022 7:45 A M Medical Record Number: [id number] Patient Account Number: [id number] Date of Birth/Sex: Treating RN: 03-11-1980 (42 y.o. [name] Primary Care Provider: Gwinda Passe Other Clinician: Referring Provider: Treating Provider/Extender: [name] in Treatment: 0 Verbal / Phone Orders: No Diagnosis Coding ICD-10 Coding Code Description E11.621 Type 2 diabetes mellitus with foot ulcer L97.514 Non-pressure chronic ulcer of other part of right foot with necrosis of bone Follow-up Appointments ppointment in 1 week. - Dr. Mikey Bussing 1015 08/15/2022 Thursday Return A room 9 ppointment in 2 weeks. - Dr. Mikey Bussing 115 08/22/2022 Thursday Return A Room 9 Other: - pick up oral antibiotics and dakin's from your pharmacy. call hangers to get shoe adjusted. Anesthetic (In clinic) Topical Lidocaine 4% applied to wound bed Bathing/ Shower/ Hygiene May shower with protection but do not get wound dressing(s) wet. Protect  dressing(s) with water repellant cover (for example, large plastic bag) or a cast cover and may then take shower. Edema Control - Lymphedema / SCD / Other Elevate legs to the level of the heart or above for 30 minutes daily and/or when sitting for 3-4 times a day throughout the day. Avoid standing for long periods of time. DENEA, GRAFTON ([id number]) [id number] Page 5 of 11 Off-Loading Open toe surgical shoe to: Wound Treatment Wound #4 - Amputation Site - Transmetatarsal Wound Laterality: Right Prim Dressing: Dakin's Solution 0.25%, 16 (oz) 1 x Per Day/30 Days ary Discharge Instructions: Moisten gauze with Dakin's solution Secondary Dressing: ABD Pad, 8x10 1 x Per Day/30 Days Discharge Instructions: Apply over primary dressing as directed. Secured With: [employer], 4.5x3.1 (in/yd) 1 x Per Day/30 Days Discharge Instructions: Secure with Kerlix as directed. Secured With: 44M Medipore H Soft Cloth Surgical T ape, 4 x 10 (in/yd) 1 x Per Day/30 Days Discharge Instructions: Secure with tape as directed. Secured With: ace wrap 1 x Per Day/30 Days Discharge Instructions: apply lightly to hold dressing in place. Services and Therapies rterial Studies- Unilateral rightleg - VVS for arterial studies with ABIs to right leg related to diabetic foot wound. - (ICD10 E11.621 - Type 2 diabetes A mellitus with foot ulcer) Electronic Signature(s) Signed: 08/08/2022 12:37:31 PM By: [name] Corwin DO Entered By: [name] on 08/08/2022 11:28:17 -------------------------------------------------------------------------------- Problem List Details Patient Name: Date of Service: [name], [name] [name]. 08/08/2022 7:45 A M Medical Record Number: [id number] Patient Account Number: [id number] Date of Birth/Sex: Treating RN: 11/20/1980 (42 y.o. [name] Primary Care Provider: Gwinda Passe Other Clinician: Referring Provider: Treating Provider/Extender:  [name] in Treatment: 0 Active Problems ICD-10 Encounter Code Description Active Date MDM Diagnosis L97.512 Non-pressure chronic ulcer of other part of right foot with fat layer exposed 08/08/2022 No Yes E11.621 Type 2 diabetes mellitus with foot ulcer  08/08/2022 No Yes M86.671 Other chronic osteomyelitis, right ankle and foot 08/08/2022 No Yes Inactive Problems Resolved Problems Electronic Signature(s) Signed: 08/08/2022 12:37:31 PM By: [name] Corwin DO Entered By: [name] on 08/08/2022 11:23:40 Delvecchio, [name] ([id number]) [id number] Page 6 of 11 -------------------------------------------------------------------------------- Progress Note Details Patient Name: Date of Service: [name] [name], [name] [name]. 08/08/2022 7:45 A M Medical Record Number: [id number] Patient Account Number: [id number] Date of Birth/Sex: Treating RN: 1980-10-03 (42 y.o. F) Primary Care Provider: Gwinda Passe Other Clinician: Referring Provider: Treating Provider/Extender: [name] in Treatment: 0 Subjective Chief Complaint Information obtained from Patient 01/22/2021; Osteomyelitis of the right foot status post transmetatarsal amputation with surgical site dehiscence 08/08/2022; reopening of wound to the right foot with a history of transmetatarsal amputation with previous difficulty healing surgical site History of Present Illness (HPI) Admission 01/22/2021 [name] [name] a 42 year old female with a past medical history of insulin-dependent uncontrolled type 2 diabetes with last hemoglobin A1c of 13.5, osteomyelitis of the right foot status post transmetatarsal amputation on 12/18/2020 that presents to the clinic for right foot wound. She has had dehiscence of the surgical site. [name] using wet-to-dry dressings. She has a PICC line and receiving IV ceftriaxone daily for her osteomyelitis. There [name]  an end date of 01/27/2021. [name] also taking oral metronidazole. She currently denies systemic signs of infection. 1/19; patient presents for follow-up. She was diagnosed with a DVT to the right lower extremity 2 days ago. [name] on Eliquis now. [name] scheduled to see her infectious disease doctor tomorrow. She has been using Dakin's wet-to-dry dressings. She denies systemic signs of infection. 1/26; patient presents for follow-up. She saw infectious disease on 1/21 started on Augmentin. Her PICC line and IV ceftriaxone was discontinued. Patient reports stability to her wound. She has been using Dakin's wet-to-dry dressings. She currently denies systemic signs of infection. 2/3; patient presents for follow-up. She continues to use Dakin's wet-to-dry dressings to the wound bed. She saw [name] with infectious disease yesterday and [name] continuing Augmentin. T entative end date [name] 2/16. Patient reports following up with orthopedics. She states there [name] no further plan from them. She currently denies systemic signs of infection. 2/10; patient presents for follow-up. She continues to use Dakin's wet to dry dressings. [name] scheduled to have her MRI done on 2/14. She states that she had pain to the [name] site from last clinic visit and declines [name] today. She denies systemic signs of infection. She continues to have yellow thick drainage. 2/20; patient presents for follow-up. She continues to use Dakin's wet-to-dry dressings. She obtained her MRI. The results showed an abscess and [name] scheduled to see her orthopedic surgeon on 2/23. She saw infectious disease 2/17 and her antibiotics were extended. She currently denies systemic signs of infection. 3/6; patient presents for follow-up. She had [name] and irrigation of her foot on 03/10/2021 due to abscess noted on MRI. She was started on IV ceftriaxone and oral Flagyl. She has no issues or complaints today. She has been using  iodoform packing to the tunnel and Dakin's wet-to-dry to the opening. 03/26/2021: She continues on IV ceftriaxone and oral metronidazole. She has follow-up with infectious disease tomorrow. No significant issues or complaints today. Her mother continues to help her with her wound dressing, using iodoform packing strips into the tunnel and Dakin's to the open portion of the wound. 3/20; patient presents for follow-up. She continues to be on IV  ceftriaxone in oral metronidazole. She has been using iodoform to the tunnel and Dakin's wet-to- dry to the open wound. She denies signs of infection. 3/27; patient presents for follow-up. She no longer has a PICC line. She has been using iodoform to the tunnel and Dakin's wet-to-dry to the open wound. She reports improvement in wound healing. She denies signs of infection. 4/3; patient presents for follow-up. She states she has been using Hydrofera Blue to the open wound and iodoform packing to the tunnel without any issues. She denies signs of infection. 4/18; patient presents for follow-up. She saw infectious disease on 4/11. She has finished her oral antibiotics and completed a total of 6 weeks of antibiotics (this includes IV as well). No further antibiotics needed. She has been using Hydrofera Blue and iodoform packing. She states that the tunneled area has come in and the iodoform [name] not staying in place anymore. She has no issues or complaints today. She denies signs of infection. 4/24; patient presents for follow-up. She saw Dr. Carlene Coria, plastic surgery to discuss potential skin graft/substitute placement. At this time he thinks that the skin graft would likely not take. He [name] in agreement with trying a wound VAC. Patient has been using Hydrofera Blue dressing changes with no issues. She denies signs of infection. She reports improvement in wound healing. 5/1; patient presents for follow-up. Unfortunately patient did not have insurance when we ran for the  pico. There [name] an assistance program and we are trying to get this accommodated for the patient. In the meantime she has been using Hydrofera Blue without any issues. She denies signs of infection. 5/8; patient presents for follow-up. We have not heard back if pico [name] covered by her insurance. She has been using collagen to the wound bed over the past week. She denies signs of infection. 5/18; patient presents for follow-up. She has been using collagen to the wound bed without issues. Again we have not heard if pico [name] covered by her insurance. She has no issues or complaints today. 5/23; patient presents for follow-up. She has been using collagen to the wound bed. She has no issues or complaints today. She obtained the wound VAC from [facility] and brought this in today. She denies signs of infection. 6/1; patient presents for follow-up. She has been using the wound VAC for the past week. She has had this changed twice since she was last here. She reports more maceration to the periwound. She denies signs of infection. [name], [name] ([id number]) [id number] Page 7 of 11 6/7; right TMA site. There are 2 wounds 0 separated by a bridge of healed tissue. The more lateral area has undermining. Both areas have healthy looking granulation at the base but relative the size of the wound [name] fairly deep. There [name] no exposed bone no evidence of infection. Her wound VAC was put on hold last week because of surrounding skin maceration she has been using collagen this week. She has a modified shoe 6/12; patient presents for follow-up. Last week the wound VAC was reinitiated. She had no issues with the wound VAC itself. Today she has maceration again noted to the surrounding skin. She denies signs of infection. 6/27; patient presents for follow-up. She has been using Medihoney to the wound bed. We took a break from the wound VAC because the periwound was macerated. She still has some areas of  maceration to the distal foot where there [name] a callus. She currently denies signs of infection. 7/11; patient  presents for follow-up. She has been using Medihoney to the wound bed. She has developed some increased warmth and erythema to the lateral aspect of the right foot. She states this [name] occurred over the past week and there [name] increased pain. No drainage noted. 7/17; patient presents for follow-up. She has been using Medihoney to the wound bed. She completed her course of Bactrim. She reports improvement in symptoms. 7/24; Patient presents for follow up. She has been using Medihoney to the wound bed without issues. She completed another course of Bactrim. She reports improvement in her symptoms but still has some mild tenderness to the medial aspect of the foot. 7/31; Patient presents for follow-up. She has been using Medihoney and Dakin's to the wound bed. She denies signs of infection. 8/14; patient presents for follow-up. She has been using Dakin's wet-to-dry packing strips to the right medial aspect of the amputation site and Medihoney to the anterior site. She has no issues or complaints today. She has started physical therapy. She denies signs of infection. 8/29; patient presents for follow-up. She has been using Dakin's wet-to-dry packing strips to the right medial aspect of the amputation site however this [name] becoming more difficult to place. She did report that she had increased redness and swelling to that site and developed some drainage. It has resolved. She continues with physical therapy. 9/8; patient presents for follow-up. We have been using silver alginate to the tunneled area. She completed her course of antibiotics. She reports no pain, increased swelling or erythema. 9/21; patient presents for follow-up. She has been using gentamicin to the tunneled area. She has no issues or complaints today. She denies signs of infection. 10/2; patient presents for follow-up. [name]  scheduled to have her CT scan on 10/9. She currently denies signs of infection. She denies increased warmth, erythema or purulent drainage from the wound bed. She has been using Dakin's wet-to-dry dressings. 10/12; patient presents for follow-up. She had her CT scan on 10/9 that showed A small irregular rim-enhancing fluid pocket communicating to the overlying soft tissues of the sinus tract compatible with a small abscess. Currently she denies systemic signs of infection. She has been doing Dakin's wet-to-dry packing strips but it [name] hard for her to pack into the narrow opening. 10/30; patient presents for follow-up. Since last clinic visit she has had OR [name] of her Right foot by Dr. Odis Hollingshead Due to abscess noted on CT. She has been using iodoform packing. 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 She will complete 2 weeks of this and continue with oral amoxicillin for the next 6 to 12 months. She follows with Dr. Luciana Axe for this. She currently denies signs of infection. 11/13; patient presents for follow-up. Patient has been using silver alginate with gentamicin to the wound bed. She has no issues or complaints today she. She reports improvement in wound healing. 12/4; patient presents for follow-up. She has been using silver alginate and gentamicin to the wound bed. She has no issues or complaints today. 12/8; patient presents for follow-up. She has been using silver alginate with gentamicin to the wound bed. She presents for her first cast placement. She will be back early next week for her obligatory cast change. 12/12; patient presents for follow-up. We have been using collagen with antibiotic ointment to the wound bed under a total contact cast. She has tolerated this well. [name] improved in wound healing. 12/18; patient presents for follow-up. We have  been using collagen with antibiotic ointment under the total contact cast. She has no  complaints today. 1/2; The cast was placed at last clinic visit however patient missed her follow-up and this was taken off last week at a nurse visit. Patient has been using packing strips to the medial narrowed wound. She has noted no drainage. 1/22; patient presents for follow-up. She has been keeping the area covered. She reports no drainage. She has had no issues to the previous wound site. She denies any signs of infection including increased warmth, erythema or purulent drainage. 08/08/2022 Ms. Thyda Maberry [name] a 42 year old female with a past medical history of insulin-dependent uncontrolled type 2 diabetes with last hemoglobin A1c of 8.8, chronic osteomyelitis of the right foot status post transmetatarsal amputation on 12/18/2020 that has been previously treated for surgical dehiscence of this site in our clinic. She presents today 6 months after discharge from our clinic due to healed right foot wound with now reopening of the previous surgical site. She reports chronic pain to the site however denies purulent drainage, increased warmth or erythema. She noticed the wound opening about 1 month ago. She has been doing physical therapy without significant issues. She has inserts to her shoe however she has been advised to follow-up with [employer] for new custom inserts by her orthopedic surgeon now that she has a new wound. She states she will try and do this. She has been using antibiotic ointment to the wound bed. Patient History Information obtained from Patient. Allergies No Known Drug Allergies Family History Cancer - Paternal Grandparents, Diabetes - Mother, Hypertension - Mother, Stroke - Maternal Grandparents, No family history of Heart Disease, Hereditary Spherocytosis, Kidney Disease, Lung Disease, Seizures, Thyroid Problems, Tuberculosis. Social History Never smoker, Marital Status - Single, Alcohol Use - Rarely, Drug Use - Prior History - Marijuana, Caffeine Use - Daily. Medical  History Cardiovascular Patient has history of Deep Vein Thrombosis, Hypertension Endocrine [name], [name] ([id number]) [id number] Page 8 of 11 Patient has history of Type II Diabetes - 8.8 HgbA1c 5 months ago Musculoskeletal Patient has history of Osteomyelitis - Right Transmet 12/18/20 Neurologic Patient has history of Neuropathy Hospitalization/Surgery History - inpatient right foot abscess 10/12-10/18/2023. - 2022 right foot transmet. Objective Constitutional respirations regular, non-labored and within target range for patient.. Vitals Time Taken: 8:15 AM, Temperature: 98.7 F, Pulse: 94 bpm, Respiratory Rate: 18 breaths/min, Blood Pressure: 139/84 mmHg. Cardiovascular 2+ dorsalis pedis/posterior tibialis pulses. Psychiatric pleasant and cooperative. General Notes: T the right foot there [name] an open wound Along the transmetatarsal up previous amputation site with nonviable tissue and postdebridement o granulation tissue present. Does not probe to bone. Mild tenderness on palpation. No purulent drainage or fluctuance noted. No increased warmth or erythema. Integumentary (Hair, Skin) Wound #4 status [name] Open. Original cause of wound was Shear/Friction. The date acquired was: 07/08/2022. The wound [name] located on the Right Amputation Site - Transmetatarsal. The wound measures 1.4cm length x 0.2cm width x 0.8cm depth; 0.22cm^2 area and 0.176cm^3 volume. There [name] Fat Layer (Subcutaneous Tissue) exposed. There [name] no tunneling or undermining noted. There [name] a medium amount of serosanguineous drainage noted. The wound margin [name] distinct with the outline attached to the wound base. There [name] large (67-100%) granulation within the wound bed. There [name] no necrotic tissue within the wound bed. The periwound skin appearance exhibited: Callus. The periwound skin appearance did not exhibit: Crepitus, Excoriation, Induration, Rash, Scarring, Dry/Scaly, Maceration, Atrophie  Blanche, Cyanosis, Ecchymosis, Hemosiderin Staining, Mottled, Pallor, Rubor,  Erythema. The periwound has tenderness on palpation. [name] Active Problems ICD-10 Non-pressure chronic ulcer of other part of right foot with fat layer exposed Type 2 diabetes mellitus with foot ulcer Other chronic osteomyelitis, right ankle and foot Patient presents with a 1 month history of wound to the right foot along the transmetatarsal previous amputation site. I debrided nonviable tissue. No obvious signs of active acute infection however she does have pain on palpation. [name] at high risk for infection due to her uncontrolled diabetes and also further amputation due to the fact she has had a transmetatarsal amputation. I recommended 2 weeks of Augmentin and doxycycline. If patient does not improve with antibiotics will need to obtain further imaging. For now use Dakin's wet-to-dry dressings. Paper prescription given for oral antibiotics and Dakin's in office. Aggressive offloading with surgical shoe and padding. I recommended holding physical therapy for now. Follow-up in 1 week. Procedures Wound #4 Pre-procedure diagnosis of Wound #4 [name] a Diabetic Wound/Ulcer of the Lower Extremity located on the Right Amputation Site - Transmetatarsal .Severity of Tissue Pre [name]: Fat layer exposed. There was a Excisional Skin/Subcutaneous Tissue [name] with a total area of 0.88 sq cm performed by [name] Corwin, DO. With the following instrument(s): Curette to remove Viable and Non-Viable tissue/material. Material removed includes Callus, Subcutaneous Tissue, Slough, Skin: Dermis, and Skin: Epidermis after achieving pain control using Lidocaine 4% T opical Solution. A time out was conducted at 09:05, prior to the start of the procedure. A Minimum amount of bleeding was controlled with Pressure. The procedure was tolerated well with a pain level of 0 throughout and a pain level of 0 following the  procedure. Post [name] Measurements: 1.4cm length x 0.8cm width x 0.8cm depth; 0.704cm^3 volume. Character of Wound/Ulcer Post [name] improved. Severity of Tissue Post [name]: Fat layer exposed. Post procedure Diagnosis Wound #4: Same as Pre-Procedure Plan [name], [name] ([id number]) [id number] Page 9 of 11 Follow-up Appointments: Return Appointment in 1 week. - Dr. Mikey Bussing 1015 08/15/2022 Thursday room 9 Return Appointment in 2 weeks. - Dr. Mikey Bussing 115 08/22/2022 Thursday Room 9 Other: - pick up oral antibiotics and dakin's from your pharmacy. call hangers to get shoe adjusted. Anesthetic: (In clinic) Topical Lidocaine 4% applied to wound bed Bathing/ Shower/ Hygiene: May shower with protection but do not get wound dressing(s) wet. Protect dressing(s) with water repellant cover (for example, large plastic bag) or a cast cover and may then take shower. Edema Control - Lymphedema / SCD / Other: Elevate legs to the level of the heart or above for 30 minutes daily and/or when sitting for 3-4 times a day throughout the day. Avoid standing for long periods of time. Off-Loading: Open toe surgical shoe to: Services and Therapies ordered were: Arterial Studies- Unilateral rightleg - VVS for arterial studies with ABIs to right leg related to diabetic foot wound. WOUND #4: - Amputation Site - Transmetatarsal Wound Laterality: Right Prim Dressing: Dakin's Solution 0.25%, 16 (oz) 1 x Per Day/30 Days ary Discharge Instructions: Moisten gauze with Dakin's solution Secondary Dressing: ABD Pad, 8x10 1 x Per Day/30 Days Discharge Instructions: Apply over primary dressing as directed. Secured With: [employer], 4.5x3.1 (in/yd) 1 x Per Day/30 Days Discharge Instructions: Secure with Kerlix as directed. Secured With: 39M Medipore H Soft Cloth Surgical T ape, 4 x 10 (in/yd) 1 x Per Day/30 Days Discharge Instructions: Secure with tape as directed. Secured  With: ace wrap 1 x Per Day/30 Days Discharge Instructions: apply lightly  to hold dressing in place. 1. In office sharp [name] 2. Augmentin and doxycycline 3. Dakin's wet-to-dry dressings 4. Aggressive offloading 5. Follow-up in 1 week Electronic Signature(s) Signed: 08/08/2022 12:37:31 PM By: [name] Corwin DO Entered By: [name] on 08/08/2022 11:30:50 -------------------------------------------------------------------------------- HxROS Details Patient Name: Date of Service: [name], [name] [name]. 08/08/2022 7:45 A M Medical Record Number: [id number] Patient Account Number: [id number] Date of Birth/Sex: Treating RN: [birth year] (42 y.o. [name] Primary Care Provider: Gwinda Passe Other Clinician: Referring Provider: Treating Provider/Extender: [name] in Treatment: 0 Information Obtained From Patient Cardiovascular Medical History: Positive for: Deep Vein Thrombosis; Hypertension Endocrine Medical History: Positive for: Type II Diabetes - 8.8 HgbA1c 5 months ago Time with diabetes: Dx 2009 Treated with: Insulin, Oral agents Blood sugar tested every day: Yes Tested : daily Musculoskeletal Medical History: Positive for: Osteomyelitis - Right Transmet 12/18/20 Neurologic Gose, [name] ([id number]) [id number] Page 10 of 11 Medical History: Positive for: Neuropathy Immunizations Pneumococcal Vaccine: Received Pneumococcal Vaccination: No Implantable Devices Yes Hospitalization / Surgery History Type of Hospitalization/Surgery inpatient right foot abscess 10/12-10/18/2023 2022 right foot transmet Family and Social History Cancer: Yes - Paternal Grandparents; Diabetes: Yes - Mother; Heart Disease: No; Hereditary Spherocytosis: No; Hypertension: Yes - Mother; Kidney Disease: No; Lung Disease: No; Seizures: No; Stroke: Yes - Maternal Grandparents; Thyroid Problems: No; Tuberculosis: No;  Never smoker; Marital Status - Single; Alcohol Use: Rarely; Drug Use: Prior History - Marijuana; Caffeine Use: Daily; Financial Concerns: No; Food, Clothing or Shelter Needs: No; Support System Lacking: No; Transportation Concerns: No Electronic Signature(s) Signed: 08/08/2022 12:37:31 PM By: [name] Corwin DO Signed: 08/09/2022 4:29:32 PM By: [name] Stall RN, BSN Entered By: [name] on 08/08/2022 08:01:36 -------------------------------------------------------------------------------- SuperBill Details Patient Name: Date of Service: [name], [name] [name]. 08/08/2022 Medical Record Number: [id number] Patient Account Number: [id number] Date of Birth/Sex: Treating RN: [birth year] (42 y.o. [name], [name] Primary Care Provider: Gwinda Passe Other Clinician: Referring Provider: Treating Provider/Extender: [name] in Treatment: 0 Diagnosis Coding ICD-10 Codes Code Description [phone] Non-pressure chronic ulcer of other part of right foot with fat layer exposed E11.621 Type 2 diabetes mellitus with foot ulcer M86.671 Other chronic osteomyelitis, right ankle and foot Facility Procedures : CPT4 Code: 55732202 Description: 99213 - WOUND CARE VISIT-LEV 3 EST PT Modifier: Quantity: 1 : CPT4 Code: [id number] Description: 11042 - DEB SUBQ TISSUE 20 SQ CM/< ICD-10 Diagnosis Description L97.512 Non-pressure chronic ulcer of other part of right foot with fat layer exposed E11.621 Type 2 diabetes mellitus with foot ulcer Modifier: Quantity: 1 Physician Procedures : CPT4 Code Description Modifier [id number] 99214 - WC PHYS LEVEL 4 - EST PT 25 ICD-10 Diagnosis Description L97.512 Non-pressure chronic ulcer of other part of right foot with fat layer exposed E11.621 Type 2 diabetes mellitus with foot ulcer M86.671 Other  chronic osteomyelitis, right ankle and foot Quantity: 1 : [id number] 11042 - WC PHYS SUBQ TISS 20 SQ CM [name], [name] ([id number])  [id number] ICD-10 Diagnosis Description L97.512 Non-pressure chronic ulcer of other part of right foot with fat layer exposed E11.621 Type 2 diabetes  mellitus with foot ulcer Quantity: 1 df Page 11 of 11 Electronic Signature(s) Signed: 08/08/2022 12:37:31 PM By: [name] Corwin DO Entered By: [name] on 08/08/2022 11:32:06

## 2022-08-15 ENCOUNTER — Encounter (HOSPITAL_BASED_OUTPATIENT_CLINIC_OR_DEPARTMENT_OTHER): Payer: Medicaid Other | Attending: Internal Medicine | Admitting: Internal Medicine

## 2022-08-15 DIAGNOSIS — M86671 Other chronic osteomyelitis, right ankle and foot: Secondary | ICD-10-CM | POA: Diagnosis not present

## 2022-08-15 DIAGNOSIS — I10 Essential (primary) hypertension: Secondary | ICD-10-CM | POA: Insufficient documentation

## 2022-08-15 DIAGNOSIS — E11621 Type 2 diabetes mellitus with foot ulcer: Secondary | ICD-10-CM | POA: Diagnosis not present

## 2022-08-15 DIAGNOSIS — L97512 Non-pressure chronic ulcer of other part of right foot with fat layer exposed: Secondary | ICD-10-CM | POA: Insufficient documentation

## 2022-08-15 DIAGNOSIS — Z89431 Acquired absence of right foot: Secondary | ICD-10-CM | POA: Insufficient documentation

## 2022-08-15 DIAGNOSIS — Z794 Long term (current) use of insulin: Secondary | ICD-10-CM | POA: Insufficient documentation

## 2022-08-15 DIAGNOSIS — Z7901 Long term (current) use of anticoagulants: Secondary | ICD-10-CM | POA: Insufficient documentation

## 2022-08-15 DIAGNOSIS — Z86718 Personal history of other venous thrombosis and embolism: Secondary | ICD-10-CM | POA: Diagnosis not present

## 2022-08-16 NOTE — Progress Notes (Signed)
[name], [name] ([id number]) [id number] Page 1 of 8 Visit Report for 08/15/2022 Arrival Information Details Patient Name: Date of Service: [name] IS, [place] [name] 08/15/2022 10:15 A M Medical Record Number: [id number] Patient Account Number: [id number] Date of Birth/Sex: Treating RN: [birth year] (42 y.o. F) Primary Care Elliette Seabolt: Gwinda Passe Other Clinician: Referring Gillian Meeuwsen: Treating Antar Milks/Extender: Grace Isaac in Treatment: 1 Visit Information History Since Last Visit Added or deleted any medications: No Patient Arrived: Wheel Chair Any new allergies or adverse reactions: No Arrival Time: 10:36 Had a fall or experienced change in No Accompanied By: self activities of daily living that may affect Transfer Assistance: None risk of falls: Patient Identification Verified: Yes Signs or symptoms of abuse/neglect since last visito No Secondary Verification Process Completed: Yes Hospitalized since last visit: No Patient Requires Transmission-Based Precautions: No Implantable device outside of the clinic excluding No Patient Has Alerts: Yes cellular tissue based products placed in the center Patient Alerts: Patient on Blood Thinner since last visit: Eliquis Has Dressing in Place as Prescribed: Yes Pain Present Now: No Electronic Signature(s) Signed: 08/16/2022 11:53:17 AM By: Thayer Dallas Entered By: Thayer Dallas on 08/15/2022 10:40:28 -------------------------------------------------------------------------------- Encounter Discharge Information Details Patient Name: Date of Service: [name], [name] [name] 08/15/2022 10:15 A M Medical Record Number: [id number] Patient Account Number: [id number] Date of Birth/Sex: Treating RN: 06/28/1980 (42 y.o. Katrinka Blazing Primary Care Cache Bills: Gwinda Passe Other Clinician: Referring Rosha Cocker: Treating Jaxie Racanelli/Extender: Grace Isaac in Treatment:  1 Encounter Discharge Information Items Post Procedure Vitals Discharge Condition: Stable Temperature (F): 98 Ambulatory Status: Wheelchair Pulse (bpm): 80 Discharge Destination: Home Respiratory Rate (breaths/min): 18 Transportation: Private Auto Blood Pressure (mmHg): 107/74 Accompanied By: self Schedule Follow-up Appointment: Yes Clinical Summary of Care: Electronic Signature(s) Signed: 08/15/2022 6:49:35 PM By: Karie Schwalbe RN Entered By: Karie Schwalbe on 08/15/2022 18:23:05 [name], [name] ([id number]) [id number] Page 2 of 8 -------------------------------------------------------------------------------- Lower Extremity Assessment Details Patient Name: Date of Service: [name] IS, [name] [name] 08/15/2022 10:15 A M Medical Record Number: [id number] Patient Account Number: [id number] Date of Birth/Sex: Treating RN: [birth year] (42 y.o. Katrinka Blazing Primary Care Latayna Ritchie: Gwinda Passe Other Clinician: Referring Hiromi Knodel: Treating Anaysha Andre/Extender: Tobie Poet Weeks in Treatment: 1 Edema Assessment Assessed: [Left: No] [Right: No] Edema: [Left: Ye] [Right: s] Calf Left: Right: Point of Measurement: 31 cm From Medial Instep 51 cm Ankle Left: Right: Point of Measurement: 9 cm From Medial Instep 30 cm Vascular Assessment Pulses: Dorsalis Pedis Palpable: [Right:Yes] Extremity colors, hair growth, and conditions: Extremity Color: [Right:Hyperpigmented] Hair Growth on Extremity: [Right:No] Temperature of Extremity: [Right:Warm] Capillary Refill: [Right:< 3 seconds] Dependent Rubor: [Right:No Yes] Notes Amputated toes Right foot Electronic Signature(s) Signed: 08/15/2022 6:49:35 PM By: Karie Schwalbe RN Entered By: Karie Schwalbe on 08/15/2022 10:45:42 -------------------------------------------------------------------------------- Multi Wound Chart Details Patient Name: Date of Service: [name], [name] [name]  08/15/2022 10:15 A M Medical Record Number: [id number] Patient Account Number: [id number] Date of Birth/Sex: Treating RN: 1980-04-25 (42 y.o. F) Primary Care Hamp Moreland: Gwinda Passe Other Clinician: Referring Caelyn Route: Treating Quantavia Frith/Extender: Grace Isaac in Treatment: 1 Vital Signs Height(in): Pulse(bpm): 80 Weight(lbs): Blood Pressure(mmHg): 107/74 Body Mass Index(BMI): Temperature(F): 98 Respiratory Rate(breaths/min): 18 [[name], [name] L ([id number]):Photos:] [[id number] Page 3 of 8:4 N/A N/A N/A N/A] Right Amputation Site - N/A N/A Wound Location: Transmetatarsal Shear/Friction N/A N/A Wounding Event: Diabetic Wound/Ulcer of the Lower N/A N/A Primary Etiology: Extremity Dehisced Wound N/A N/A Secondary Etiology:  Deep Vein Thrombosis, Hypertension, N/A N/A Comorbid History: Type II Diabetes, Osteomyelitis, Neuropathy 07/08/2022 N/A N/A Date Acquired: 1 N/A N/A Weeks of Treatment: Open N/A N/A Wound Status: No N/A N/A Wound Recurrence: 1.2x0.2x0.5 N/A N/A Measurements L x W x D (cm) 0.188 N/A N/A A (cm) : rea 0.094 N/A N/A Volume (cm) : 14.50% N/A N/A % Reduction in A rea: 46.60% N/A N/A % Reduction in Volume: Grade 3 N/A N/A Classification: Medium N/A N/A Exudate A mount: Serosanguineous N/A N/A Exudate Type: red, brown N/A N/A Exudate Color: Distinct, outline attached N/A N/A Wound Margin: Small (1-33%) N/A N/A Granulation A mount: Pink N/A N/A Granulation Quality: Large (67-100%) N/A N/A Necrotic A mount: Eschar, Adherent Slough N/A N/A Necrotic Tissue: Fat Layer (Subcutaneous Tissue): Yes N/A N/A Exposed Structures: Fascia: No Tendon: No Muscle: No Joint: No Bone: No None N/A N/A Epithelialization: Debridement - Excisional N/A N/A Debridement: Pre-procedure Verification/Time Out 11:07 N/A N/A Taken: Callus, Subcutaneous, Slough N/A N/A Tissue Debrided: Skin/Subcutaneous  Tissue N/A N/A Level: 0.19 N/A N/A Debridement A (sq cm): rea Curette N/A N/A Instrument: Minimum N/A N/A Bleeding: Pressure N/A N/A Hemostasis A chieved: 0 N/A N/A Procedural Pain: 0 N/A N/A Post Procedural Pain: Procedure was tolerated well N/A N/A Debridement Treatment Response: 1.2x0.2x0.5 N/A N/A Post Debridement Measurements L x W x D (cm) 0.094 N/A N/A Post Debridement Volume: (cm) Callus: Yes N/A N/A Periwound Skin Texture: Excoriation: No Induration: No Crepitus: No Rash: No Scarring: No Maceration: No N/A N/A Periwound Skin Moisture: Dry/Scaly: No Atrophie Blanche: No N/A N/A Periwound Skin Color: Cyanosis: No Ecchymosis: No Erythema: No Hemosiderin Staining: No Mottled: No Pallor: No Rubor: No Yes N/A N/A Tenderness on Palpation: Debridement N/A N/A Procedures Performed: Treatment Notes Electronic Signature(s) Signed: 08/15/2022 4:31:19 PM By: Oletta Darter, [name] ([id number]) [id number] Page 4 of 8 Entered By: Geralyn Corwin on 08/15/2022 11:31:27 -------------------------------------------------------------------------------- Multi-Disciplinary Care Plan Details Patient Name: Date of Service: [name] IS, [place] [name] 08/15/2022 10:15 A M Medical Record Number: [id number] Patient Account Number: [id number] Date of Birth/Sex: Treating RN: 05/11/1980 (42 y.o. Katrinka Blazing Primary Care Rynn Markiewicz: Gwinda Passe Other Clinician: Referring Jakerria Kingbird: Treating Wanda Rideout/Extender: Grace Isaac in Treatment: 1 Active Inactive Necrotic Tissue Nursing Diagnoses: Knowledge deficit related to management of necrotic/devitalized tissue Goals: Necrotic/devitalized tissue will be minimized in the wound bed Date Initiated: 08/08/2022 Target Resolution Date: 10/14/2022 Goal Status: Active Patient/caregiver will verbalize understanding of reason and process for debridement of necrotic  tissue Date Initiated: 08/08/2022 Target Resolution Date: 10/14/2022 Goal Status: Active Interventions: Assess patient pain level pre-, during and post procedure and prior to discharge Provide education on necrotic tissue and debridement process Notes: Nutrition Nursing Diagnoses: Impaired glucose control: actual or potential Goals: Patient/caregiver agrees to and verbalizes understanding of need to obtain nutritional consultation Date Initiated: 08/08/2022 Target Resolution Date: 08/23/2022 Goal Status: Active Patient/caregiver will maintain therapeutic glucose control Date Initiated: 08/08/2022 Target Resolution Date: 08/23/2022 Goal Status: Active Interventions: Assess HgA1c results as ordered upon admission and as needed Provide education on elevated blood sugars and impact on wound healing Provide education on nutrition Treatment Activities: Obtain HgA1c : 08/08/2022 Patient referred to Primary Care Physician for further nutritional evaluation : 08/08/2022 Notes: Orientation to the Wound Care Program Nursing Diagnoses: Knowledge deficit related to the wound healing center program Goals: Patient/caregiver will verbalize understanding of the Wound Healing Center Program Date Initiated: 08/08/2022 Target Resolution Date: 10/14/2022 Goal Status: Active Interventions: Provide education on orientation  to the wound center BERNADETTA, ROELL L ([id number]) [id number] Page 5 of 8 Notes: Pain, Acute or Chronic Nursing Diagnoses: Pain, acute or chronic: actual or potential Potential alteration in comfort, pain Goals: Patient will verbalize adequate pain control and receive pain control interventions during procedures as needed Date Initiated: 08/08/2022 Target Resolution Date: 10/14/2022 Goal Status: Active Patient/caregiver will verbalize comfort level met Date Initiated: 08/08/2022 Target Resolution Date: 10/14/2022 Goal Status: Active Interventions: Complete  pain assessment as per visit requirements Encourage patient to take pain medications as prescribed Provide education on pain management Treatment Activities: Administer pain control measures as ordered : 08/08/2022 Notes: Wound/Skin Impairment Nursing Diagnoses: Knowledge deficit related to ulceration/compromised skin integrity Goals: Patient/caregiver will verbalize understanding of skin care regimen Date Initiated: 08/08/2022 Target Resolution Date: 10/14/2022 Goal Status: Active Ulcer/skin breakdown will heal within 14 weeks Date Initiated: 08/08/2022 Target Resolution Date: 10/14/2022 Goal Status: Active Interventions: Assess patient/caregiver ability to perform ulcer/skin care regimen upon admission and as needed Assess ulceration(s) every visit Provide education on ulcer and skin care Treatment Activities: Skin care regimen initiated : 08/08/2022 Topical wound management initiated : 08/08/2022 Notes: Electronic Signature(s) Signed: 08/15/2022 6:49:35 PM By: Karie Schwalbe RN Entered By: Karie Schwalbe on 08/15/2022 18:21:43 -------------------------------------------------------------------------------- Pain Assessment Details Patient Name: Date of Service: [name] Pereyra, [name] [name] 08/15/2022 10:15 A M Medical Record Number: [id number] Patient Account Number: [id number] Date of Birth/Sex: Treating RN: 1980/12/14 (42 y.o. F) Primary Care Nafisa Olds: Gwinda Passe Other Clinician: Referring Charlcie Prisco: Treating Armanie Ullmer/Extender: Grace Isaac in Treatment: 1 Active Problems Location of Pain Severity and Description of Pain Patient Has Paino No [name], [name] L ([id number]) [id number] Page 6 of 8 Patient Has Paino No Site Locations Pain Management and Medication Current Pain Management: Electronic Signature(s) Signed: 08/16/2022 11:53:17 AM By: Thayer Dallas Entered By: Thayer Dallas on 08/15/2022  10:40:44 -------------------------------------------------------------------------------- Patient/Caregiver Education Details Patient Name: Date of Service: [name], [name] [name] 8/1/2024andnbsp10:15 A M Medical Record Number: [id number] Patient Account Number: [id number] Date of Birth/Gender: Treating RN: 11/25/1980 (42 y.o. Katrinka Blazing Primary Care Physician: Gwinda Passe Other Clinician: Referring Physician: Treating Physician/Extender: Grace Isaac in Treatment: 1 Education Assessment Education Provided To: Patient Education Topics Provided Wound/Skin Impairment: Methods: Explain/Verbal Responses: Return demonstration correctly Electronic Signature(s) Signed: 08/15/2022 6:49:35 PM By: Karie Schwalbe RN Entered By: Karie Schwalbe on 08/15/2022 18:22:07 -------------------------------------------------------------------------------- Wound Assessment Details Patient Name: Date of Service: [name] IS, [name] [name] 08/15/2022 10:15 A M Medical Record Number: [id number] Patient Account Number: [id number] Date of Birth/Sex: Treating RN: [birth year] (42 y.o. 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 Location: Right Amputation Site - Transmetatarsal Secondary Dehisced Wound Wounding Event: Shear/Friction Etiology: Date Acquired: 07/08/2022 Wound Status: Open Weeks Of Treatment: 1 Comorbid Deep Vein Thrombosis, Hypertension, Type II Diabetes, Clustered Wound: No History: Osteomyelitis, Neuropathy Photos Wound Measurements Length: (cm) 1.2 Width: (cm) 0.2 Depth: (cm) 0.5 Area: (cm) 0.188 Volume: (cm) 0.094 % Reduction in Area:  14.5% % Reduction in Volume: 46.6% Epithelialization: None Tunneling: No Undermining: No Wound Description Classification: Grade 3 Wound Margin: Distinct, outline attached Exudate Amount: Medium Exudate Type: Serosanguineous Exudate Color: red, brown Foul Odor After Cleansing: No Slough/Fibrino Yes Wound Bed Granulation Amount: Small (1-33%) Exposed Structure Granulation Quality: Pink Fascia  Exposed: No Necrotic Amount: Large (67-100%) Fat Layer (Subcutaneous Tissue) Exposed: Yes Necrotic Quality: Eschar, Adherent Slough Tendon Exposed: No Muscle Exposed: No Joint Exposed: No Bone Exposed: No Periwound Skin Texture Texture Color No Abnormalities Noted: No No Abnormalities Noted: No Callus: Yes Atrophie Blanche: No Crepitus: No Cyanosis: No Excoriation: No Ecchymosis: No Induration: No Erythema: No Rash: No Hemosiderin Staining: No Scarring: No Mottled: No Pallor: No Moisture Rubor: No No Abnormalities Noted: No Dry / Scaly: No Temperature / Pain Maceration: No Tenderness on Palpation: Yes Treatment Notes Wound #4 (Amputation Site - Transmetatarsal) Wound Laterality: Right Cleanser Peri-Wound Care Topical Gentamicin Discharge Instruction: As directed by physician [name] ([id number]) [phone].pdf Page 8 of 8 Primary Dressing Maxorb Extra Ag+ Alginate Dressing, 2x2 (in/in) Discharge Instruction: Apply to wound bed as instructed Secondary Dressing ABD Pad, 8x10 Discharge Instruction: Apply over primary dressing as directed. Secured With [employer], 4.5x3.1 (in/yd) Discharge Instruction: Secure with Kerlix as directed. 25M Medipore H Soft Cloth Surgical T ape, 4 x 10 (in/yd) Discharge Instruction: Secure with tape as directed. ace wrap Discharge Instruction: apply lightly to hold dressing in place. Compression Wrap Compression Stockings Add-Ons Electronic Signature(s) Signed: 08/15/2022 6:49:35 PM By: Karie Schwalbe RN Entered By: Karie Schwalbe on 08/15/2022 10:51:44 -------------------------------------------------------------------------------- Vitals Details Patient Name: Date of Service: [name], [name] [name] 08/15/2022 10:15 A M Medical Record Number: [id number] Patient Account Number: [id number] Date of Birth/Sex: Treating RN: November 03, 1980 (42 y.o. F) Primary Care Catalino Plascencia: Gwinda Passe Other Clinician: Referring Jya Hughston: Treating Daleah Coulson/Extender: Grace Isaac in Treatment: 1 Vital Signs Time Taken: 10:40 Temperature (F): 98 Pulse (bpm): 80 Respiratory Rate (breaths/min): 18 Blood Pressure (mmHg): 107/74 Reference Range: 80 - 120 mg / dl Electronic Signature(s) Signed: 08/16/2022 11:53:17 AM By: Thayer Dallas Entered By: Thayer Dallas on 08/15/2022 10:42:47

## 2022-08-16 NOTE — Progress Notes (Signed)
[name] ([id number]) [id number] Page 1 of 10 Visit Report for 08/15/2022 Chief Complaint Document Details Patient Name: Date of Service: [name] IS, [place] [name]. 08/15/2022 10:15 A M Medical Record Number: [id number] Patient Account Number: [id number] Date of Birth/Sex: Treating RN: [birth year] (42 y.o. F) Primary Care Provider: Gwinda [name] Other Clinician: Referring Provider: Treating Provider/Extender: [name] in Treatment: 1 Information Obtained from: Patient Chief Complaint 01/22/2021; Osteomyelitis of the right foot status post transmetatarsal amputation with surgical site dehiscence 08/08/2022; reopening of wound to the right foot with a history of transmetatarsal amputation with previous difficulty healing surgical site Electronic Signature(s) Signed: 08/15/2022 4:31:19 PM By: [name] Corwin DO Entered By: [name] on 08/15/2022 11:32:03 -------------------------------------------------------------------------------- Debridement Details Patient Name: Date of Service: [name] V IS, MA [name]. 08/15/2022 10:15 A M Medical Record Number: [id number] Patient Account Number: [id number] Date of Birth/Sex: Treating RN: [birth year] (42 y.o. [name] Primary Care Provider: Gwinda [name] Other Clinician: Referring Provider: Treating Provider/Extender: [name] in Treatment: 1 Debridement Performed for Assessment: Wound #4 Right Amputation Site - Transmetatarsal Performed By: Physician [name] Corwin, DO Debridement Type: Debridement Severity of Tissue Pre Debridement: Fat layer exposed Level of Consciousness (Pre-procedure): Awake and Alert Pre-procedure Verification/Time Out Yes - 11:07 Taken: Start Time: 11:07 Percent of Wound Bed Debrided: 100% T Area Debrided (cm): otal 0.19 Tissue and other material debrided: Viable, Non-Viable, Callus, Slough, Subcutaneous, Skin:  Dermis , Skin: Epidermis, Slough Level: Skin/Subcutaneous Tissue Debridement Description: Excisional Instrument: Curette Bleeding: Minimum Hemostasis Achieved: Pressure End Time: 11:09 Procedural Pain: 0 Post Procedural Pain: 0 Response to Treatment: Procedure was tolerated well Level of Consciousness (Post- Awake and Alert procedure): Post Debridement Measurements of Total Wound Length: (cm) 1.2 Width: (cm) 0.2 Depth: (cm) 0.5 Volume: (cm) 0.094 Character of Wound/Ulcer Post Debridement: Improved [name] ([id number]) [id number] Page 2 of 10 Severity of Tissue Post Debridement: Fat layer exposed Post Procedure Diagnosis Same as Pre-procedure Notes Scribed for [name] by [name] Electronic Signature(s) Signed: 08/15/2022 4:31:19 PM By: [name] Corwin DO Signed: 08/15/2022 6:49:35 PM By: [name] Schwalbe RN Entered By: [name] on 08/15/2022 11:11:53 -------------------------------------------------------------------------------- HPI Details Patient Name: Date of Service: [name] V IS, MA [name]. 08/15/2022 10:15 A M Medical Record Number: [id number] Patient Account Number: [id number] Date of Birth/Sex: Treating RN: August 28, 1980 (42 y.o. F) Primary Care Provider: Gwinda [name] Other Clinician: Referring Provider: Treating Provider/Extender: [name] in Treatment: 1 History of Present Illness HPI Description: Admission 01/22/2021 [name] is a 42 year old female with a past medical history of insulin-dependent uncontrolled type 2 diabetes with last hemoglobin A1c of 13.5, osteomyelitis of the right foot status post transmetatarsal amputation on 12/18/2020 that presents to the clinic for right foot wound. She has had dehiscence of the surgical site. She is currently using wet-to-dry dressings. She has a PICC line and receiving IV ceftriaxone daily for her osteomyelitis. There is an end date of  01/27/2021. She is also taking oral metronidazole. She currently denies systemic signs of infection. 1/19; patient presents for follow-up. She was diagnosed with a DVT to the right lower extremity 2 days ago. She is on Eliquis now. She is scheduled to see her infectious disease doctor tomorrow. She has been using Dakin's wet-to-dry dressings. She denies systemic signs of infection. 1/26; patient presents for follow-up. She saw infectious disease on 1/21 started on Augmentin. Her PICC line and IV ceftriaxone was discontinued. Patient reports stability to her  wound. She has been using Dakin's wet-to-dry dressings. She currently denies systemic signs of infection. 2/3; patient presents for follow-up. She continues to use Dakin's wet-to-dry dressings to the wound bed. She saw [name] with infectious disease yesterday and is continuing Augmentin. T entative end date is 2/16. Patient reports following up with orthopedics. She states there is no further plan from them. She currently denies systemic signs of infection. 2/10; patient presents for follow-up. She continues to use Dakin's wet to dry dressings. She is scheduled to have her MRI done on 2/14. She states that she had pain to the debridement site from last clinic visit and declines debridement today. She denies systemic signs of infection. She continues to have yellow thick drainage. 2/20; patient presents for follow-up. She continues to use Dakin's wet-to-dry dressings. She obtained her MRI. The results showed an abscess and she is scheduled to see her orthopedic surgeon on 2/23. She saw infectious disease 2/17 and her antibiotics were extended. She currently denies systemic signs of infection. 3/6; patient presents for follow-up. She had debridement and irrigation of her foot on 03/10/2021 due to abscess noted on MRI. She was started on IV ceftriaxone and oral Flagyl. She has no issues or complaints today. She has been using iodoform packing to  the tunnel and Dakin's wet-to-dry to the opening. 03/26/2021: She continues on IV ceftriaxone and oral metronidazole. She has follow-up with infectious disease tomorrow. No significant issues or complaints today. Her mother continues to help her with her wound dressing, using iodoform packing strips into the tunnel and Dakin's to the open portion of the wound. 3/20; patient presents for follow-up. She continues to be on IV ceftriaxone in oral metronidazole. She has been using iodoform to the tunnel and Dakin's wet-to- dry to the open wound. She denies signs of infection. 3/27; patient presents for follow-up. She no longer has a PICC line. She has been using iodoform to the tunnel and Dakin's wet-to-dry to the open wound. She reports improvement in wound healing. She denies signs of infection. 4/3; patient presents for follow-up. She states she has been using Hydrofera Blue to the open wound and iodoform packing to the tunnel without any issues. She denies signs of infection. 4/18; patient presents for follow-up. She saw infectious disease on 4/11. She has finished her oral antibiotics and completed a total of 6 weeks of antibiotics (this includes IV as well). No further antibiotics needed. She has been using Hydrofera Blue and iodoform packing. She states that the tunneled area has come in and the iodoform is not staying in place anymore. She has no issues or complaints today. She denies signs of infection. 4/24; patient presents for follow-up. She saw Dr. Carlene Coria, plastic surgery to discuss potential skin graft/substitute placement. At this time he thinks that the skin graft would likely not take. He is in agreement with trying a wound VAC. Patient has been using Hydrofera Blue dressing changes with no issues. She denies signs of infection. She reports improvement in wound healing. 5/1; patient presents for follow-up. Unfortunately patient did not have insurance when we ran for the pico. There is an  assistance program and we are trying to get this accommodated for the patient. In the meantime she has been using Hydrofera Blue without any issues. She denies signs of infection. [name], [name] ([id number]) [id number] Page 3 of 10 5/8; patient presents for follow-up. We have not heard back if pico is covered by her insurance. She has been using collagen to the  wound bed over the past week. She denies signs of infection. 5/18; patient presents for follow-up. She has been using collagen to the wound bed without issues. Again we have not heard if pico is covered by her insurance. She has no issues or complaints today. 5/23; patient presents for follow-up. She has been using collagen to the wound bed. She has no issues or complaints today. She obtained the wound VAC from [facility] and brought this in today. She denies signs of infection. 6/1; patient presents for follow-up. She has been using the wound VAC for the past week. She has had this changed twice since she was last here. She reports more maceration to the periwound. She denies signs of infection. 6/7; right TMA site. There are 2 wounds 0 separated by a bridge of healed tissue. The more lateral area has undermining. Both areas have healthy looking granulation at the base but relative the size of the wound is fairly deep. There is no exposed bone no evidence of infection. Her wound VAC was put on hold last week because of surrounding skin maceration she has been using collagen this week. She has a modified shoe 6/12; patient presents for follow-up. Last week the wound VAC was reinitiated. She had no issues with the wound VAC itself. Today she has maceration again noted to the surrounding skin. She denies signs of infection. 6/27; patient presents for follow-up. She has been using Medihoney to the wound bed. We took a break from the wound VAC because the periwound was macerated. She still has some areas of maceration to the  distal foot where there is a callus. She currently denies signs of infection. 7/11; patient presents for follow-up. She has been using Medihoney to the wound bed. She has developed some increased warmth and erythema to the lateral aspect of the right foot. She states this is occurred over the past week and there is increased pain. No drainage noted. 7/17; patient presents for follow-up. She has been using Medihoney to the wound bed. She completed her course of Bactrim. She reports improvement in symptoms. 7/24; Patient presents for follow up. She has been using Medihoney to the wound bed without issues. She completed another course of Bactrim. She reports improvement in her symptoms but still has some mild tenderness to the medial aspect of the foot. 7/31; Patient presents for follow-up. She has been using Medihoney and Dakin's to the wound bed. She denies signs of infection. 8/14; patient presents for follow-up. She has been using Dakin's wet-to-dry packing strips to the right medial aspect of the amputation site and Medihoney to the anterior site. She has no issues or complaints today. She has started physical therapy. She denies signs of infection. 8/29; patient presents for follow-up. She has been using Dakin's wet-to-dry packing strips to the right medial aspect of the amputation site however this is becoming more difficult to place. She did report that she had increased redness and swelling to that site and developed some drainage. It has resolved. She continues with physical therapy. 9/8; patient presents for follow-up. We have been using silver alginate to the tunneled area. She completed her course of antibiotics. She reports no pain, increased swelling or erythema. 9/21; patient presents for follow-up. She has been using gentamicin to the tunneled area. She has no issues or complaints today. She denies signs of infection. 10/2; patient presents for follow-up. She is scheduled to have her CT  scan on 10/9. She currently denies signs of infection. She denies increased  warmth, erythema or purulent drainage from the wound bed. She has been using Dakin's wet-to-dry dressings. 10/12; patient presents for follow-up. She had her CT scan on 10/9 that showed A small irregular rim-enhancing fluid pocket communicating to the overlying soft tissues of the sinus tract compatible with a small abscess. Currently she denies systemic signs of infection. She has been doing Dakin's wet-to-dry packing strips but it is hard for her to pack into the narrow opening. 10/30; patient presents for follow-up. Since last clinic visit she has had OR debridement of her Right foot by Dr. Odis Hollingshead Due to abscess noted on CT. She has been using iodoform packing. 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 She will complete 2 weeks of this and continue with oral amoxicillin for the next 6 to 12 months. She follows with Dr. Luciana Axe for this. She currently denies signs of infection. 11/13; patient presents for follow-up. Patient has been using silver alginate with gentamicin to the wound bed. She has no issues or complaints today she. She reports improvement in wound healing. 12/4; patient presents for follow-up. She has been using silver alginate and gentamicin to the wound bed. She has no issues or complaints today. 12/8; patient presents for follow-up. She has been using silver alginate with gentamicin to the wound bed. She presents for her first cast placement. She will be back early next week for her obligatory cast change. 12/12; patient presents for follow-up. We have been using collagen with antibiotic ointment to the wound bed under a total contact cast. She has tolerated this well. She is improved in wound healing. 12/18; patient presents for follow-up. We have been using collagen with antibiotic ointment under the total contact cast. She has no complaints today. 1/2; The cast  was placed at last clinic visit however patient missed her follow-up and this was taken off last week at a nurse visit. Patient has been using packing strips to the medial narrowed wound. She has noted no drainage. 1/22; patient presents for follow-up. She has been keeping the area covered. She reports no drainage. She has had no issues to the previous wound site. She denies any signs of infection including increased warmth, erythema or purulent drainage. 08/08/2022 Ms. Odessia Asleson is a 42 year old female with a past medical history of insulin-dependent uncontrolled type 2 diabetes with last hemoglobin A1c of 8.8, chronic osteomyelitis of the right foot status post transmetatarsal amputation on 12/18/2020 that has been previously treated for surgical dehiscence of this site in our clinic. She presents today 6 months after discharge from our clinic due to healed right foot wound with now reopening of the previous surgical site. She reports chronic pain to the site however denies purulent drainage, increased warmth or erythema. She noticed the wound opening about 1 month ago. She has been doing physical therapy without significant issues. She has inserts to her shoe however she has been advised to follow-up with [facility] for new custom inserts by her orthopedic surgeon now that she has a new wound. She states she will try and do this. She has been using antibiotic ointment to the wound bed. 8/1; patient presents for follow-up. She has been taking her antibiotics without issues. She has been using Dakin's wet-to-dry dressings to the wound bed. The wound is smaller. She denies systemic signs of infection. Electronic Signature(s) Signed: 08/15/2022 4:31:19 PM By: Oletta Darter, [name] ([id number]) [id number] Page 4 of 10 Entered By: [name] on 08/15/2022 11:33:10 --------------------------------------------------------------------------------  Physical  Exam Details Patient Name: Date of Service: [name] IS, [place] [name]. 08/15/2022 10:15 A M Medical Record Number: [id number] Patient Account Number: [id number] Date of Birth/Sex: Treating RN: 04-30-1980 (42 y.o. F) Primary Care Provider: Gwinda [name] Other Clinician: Referring Provider: Treating Provider/Extender: [name] in Treatment: 1 Constitutional respirations regular, non-labored and within target range for patient.. Cardiovascular 2+ dorsalis pedis/posterior tibialis pulses. Psychiatric pleasant and cooperative. Notes T the right foot there is an open wound Along the transmetatarsal previous amputation site with nonviable tissue and postdebridement granulation tissue o present. Does not probe to bone. No tenderness on palpation. No purulent drainage or fluctuance noted. No increased warmth or erythema. Electronic Signature(s) Signed: 08/15/2022 4:31:19 PM By: [name] Corwin DO Entered By: [name] on 08/15/2022 11:35:22 -------------------------------------------------------------------------------- Physician Orders Details Patient Name: Date of Service: [name] V IS, [place] [name]. 08/15/2022 10:15 A M Medical Record Number: [id number] Patient Account Number: [id number] Date of Birth/Sex: Treating RN: 1980/08/03 (42 y.o. [name] Primary Care Provider: Gwinda [name] Other Clinician: Referring Provider: Treating Provider/Extender: [name] in Treatment: 1 Verbal / Phone Orders: No Diagnosis Coding Follow-up Appointments ppointment in 1 week. - [name] Thursday 1:15PM on 08/22/2022 Return A room 9 ppointment in 2 weeks. - [name] Room 9 Thursday 08/29/22 at 11am Return A Other: - Vascular and vein appointment 08/21/22. T oral antibiotics ake call [facility] to get shoe adjusted. [phone] [address], [facility] Anesthetic (In clinic) Topical Lidocaine 4% applied to wound  bed Bathing/ Shower/ Hygiene May shower with protection but do not get wound dressing(s) wet. Protect dressing(s) with water repellant cover (for example, large plastic bag) or a cast cover and may then take shower. Edema Control - Lymphedema / SCD / Other Elevate legs to the level of the heart or above for 30 minutes daily and/or when sitting for 3-4 times a day throughout the day. Avoid standing for long periods of time. Off-Loading DELLAMAE, ROSAMILIA ([id number]) [id number] Page 5 of 10 Open toe surgical shoe to: Wound Treatment Wound #4 - Amputation Site - Transmetatarsal Wound Laterality: Right Topical: Gentamicin 1 x Per Day/30 Days Discharge Instructions: As directed by physician Prim Dressing: Maxorb Extra Ag+ Alginate Dressing, 2x2 (in/in) 1 x Per Day/30 Days ary Discharge Instructions: Apply to wound bed as instructed Secondary Dressing: ABD Pad, 8x10 1 x Per Day/30 Days Discharge Instructions: Apply over primary dressing as directed. Secured With: [employer], 4.5x3.1 (in/yd) 1 x Per Day/30 Days Discharge Instructions: Secure with Kerlix as directed. Secured With: 54M Medipore H Soft Cloth Surgical T ape, 4 x 10 (in/yd) 1 x Per Day/30 Days Discharge Instructions: Secure with tape as directed. Secured With: ace wrap 1 x Per Day/30 Days Discharge Instructions: apply lightly to hold dressing in place. Electronic Signature(s) Signed: 08/15/2022 4:31:19 PM By: [name] Corwin DO Entered By: [name] on 08/15/2022 11:35:30 -------------------------------------------------------------------------------- Problem List Details Patient Name: Date of Service: [name] V IS, MA [name]. 08/15/2022 10:15 A M Medical Record Number: [id number] Patient Account Number: [id number] Date of Birth/Sex: Treating RN: 12-Jan-1981 (42 y.o. F) Primary Care Provider: Gwinda [name] Other Clinician: Referring Provider: Treating Provider/Extender: [name] in Treatment: 1 Active Problems ICD-10 Encounter Code Description Active Date MDM Diagnosis L97.512 Non-pressure chronic ulcer of other part of right foot with fat layer exposed 08/08/2022 No Yes E11.621 Type 2 diabetes mellitus with foot ulcer 08/08/2022 No Yes M86.671 Other chronic  osteomyelitis, right ankle and foot 08/08/2022 No Yes Inactive Problems Resolved Problems Electronic Signature(s) Signed: 08/15/2022 4:31:19 PM By: [name] Corwin DO Entered By: [name] on 08/15/2022 11:31:08 [name], [name] ([id number]) [id number] Page 6 of 10 -------------------------------------------------------------------------------- Progress Note Details Patient Name: Date of Service: [name] IS, MA [name]. 08/15/2022 10:15 A M Medical Record Number: [id number] Patient Account Number: [id number] Date of Birth/Sex: Treating RN: Dec 22, 1980 (42 y.o. F) Primary Care Provider: Gwinda [name] Other Clinician: Referring Provider: Treating Provider/Extender: [name] in Treatment: 1 Subjective Chief Complaint Information obtained from Patient 01/22/2021; Osteomyelitis of the right foot status post transmetatarsal amputation with surgical site dehiscence 08/08/2022; reopening of wound to the right foot with a history of transmetatarsal amputation with previous difficulty healing surgical site History of Present Illness (HPI) Admission 01/22/2021 [name] is a 41 year old female with a past medical history of insulin-dependent uncontrolled type 2 diabetes with last hemoglobin A1c of 13.5, osteomyelitis of the right foot status post transmetatarsal amputation on 12/18/2020 that presents to the clinic for right foot wound. She has had dehiscence of the surgical site. She is currently using wet-to-dry dressings. She has a PICC line and receiving IV ceftriaxone daily for her osteomyelitis. There is an end  date of 01/27/2021. She is also taking oral metronidazole. She currently denies systemic signs of infection. 1/19; patient presents for follow-up. She was diagnosed with a DVT to the right lower extremity 2 days ago. She is on Eliquis now. She is scheduled to see her infectious disease doctor tomorrow. She has been using Dakin's wet-to-dry dressings. She denies systemic signs of infection. 1/26; patient presents for follow-up. She saw infectious disease on 1/21 started on Augmentin. Her PICC line and IV ceftriaxone was discontinued. Patient reports stability to her wound. She has been using Dakin's wet-to-dry dressings. She currently denies systemic signs of infection. 2/3; patient presents for follow-up. She continues to use Dakin's wet-to-dry dressings to the wound bed. She saw [name] with infectious disease yesterday and is continuing Augmentin. T entative end date is 2/16. Patient reports following up with orthopedics. She states there is no further plan from them. She currently denies systemic signs of infection. 2/10; patient presents for follow-up. She continues to use Dakin's wet to dry dressings. She is scheduled to have her MRI done on 2/14. She states that she had pain to the debridement site from last clinic visit and declines debridement today. She denies systemic signs of infection. She continues to have yellow thick drainage. 2/20; patient presents for follow-up. She continues to use Dakin's wet-to-dry dressings. She obtained her MRI. The results showed an abscess and she is scheduled to see her orthopedic surgeon on 2/23. She saw infectious disease 2/17 and her antibiotics were extended. She currently denies systemic signs of infection. 3/6; patient presents for follow-up. She had debridement and irrigation of her foot on 03/10/2021 due to abscess noted on MRI. She was started on IV ceftriaxone and oral Flagyl. She has no issues or complaints today. She has been using iodoform  packing to the tunnel and Dakin's wet-to-dry to the opening. 03/26/2021: She continues on IV ceftriaxone and oral metronidazole. She has follow-up with infectious disease tomorrow. No significant issues or complaints today. Her mother continues to help her with her wound dressing, using iodoform packing strips into the tunnel and Dakin's to the open portion of the wound. 3/20; patient presents for follow-up. She continues to be on IV ceftriaxone in oral metronidazole. She has  been using iodoform to the tunnel and Dakin's wet-to- dry to the open wound. She denies signs of infection. 3/27; patient presents for follow-up. She no longer has a PICC line. She has been using iodoform to the tunnel and Dakin's wet-to-dry to the open wound. She reports improvement in wound healing. She denies signs of infection. 4/3; patient presents for follow-up. She states she has been using Hydrofera Blue to the open wound and iodoform packing to the tunnel without any issues. She denies signs of infection. 4/18; patient presents for follow-up. She saw infectious disease on 4/11. She has finished her oral antibiotics and completed a total of 6 weeks of antibiotics (this includes IV as well). No further antibiotics needed. She has been using Hydrofera Blue and iodoform packing. She states that the tunneled area has come in and the iodoform is not staying in place anymore. She has no issues or complaints today. She denies signs of infection. 4/24; patient presents for follow-up. She saw Dr. Carlene Coria, plastic surgery to discuss potential skin graft/substitute placement. At this time he thinks that the skin graft would likely not take. He is in agreement with trying a wound VAC. Patient has been using Hydrofera Blue dressing changes with no issues. She denies signs of infection. She reports improvement in wound healing. 5/1; patient presents for follow-up. Unfortunately patient did not have insurance when we ran for the pico.  There is an assistance program and we are trying to get this accommodated for the patient. In the meantime she has been using Hydrofera Blue without any issues. She denies signs of infection. 5/8; patient presents for follow-up. We have not heard back if pico is covered by her insurance. She has been using collagen to the wound bed over the past week. She denies signs of infection. 5/18; patient presents for follow-up. She has been using collagen to the wound bed without issues. Again we have not heard if pico is covered by her insurance. She has no issues or complaints today. 5/23; patient presents for follow-up. She has been using collagen to the wound bed. She has no issues or complaints today. She obtained the wound VAC from [facility] and brought this in today. She denies signs of infection. 6/1; patient presents for follow-up. She has been using the wound VAC for the past week. She has had this changed twice since she was last here. She reports more maceration to the periwound. She denies signs of infection. 6/7; right TMA site. There are 2 wounds 0 separated by a bridge of healed tissue. The more lateral area has undermining. Both areas have healthy looking granulation at the base but relative the size of the wound is fairly deep. There is no exposed bone no evidence of infection. [name], [name] ([id number]) [id number] Page 7 of 10 Her wound VAC was put on hold last week because of surrounding skin maceration she has been using collagen this week. She has a modified shoe 6/12; patient presents for follow-up. Last week the wound VAC was reinitiated. She had no issues with the wound VAC itself. Today she has maceration again noted to the surrounding skin. She denies signs of infection. 6/27; patient presents for follow-up. She has been using Medihoney to the wound bed. We took a break from the wound VAC because the periwound was macerated. She still has some areas of  maceration to the distal foot where there is a callus. She currently denies signs of infection. 7/11; patient presents for follow-up. She has been  using Medihoney to the wound bed. She has developed some increased warmth and erythema to the lateral aspect of the right foot. She states this is occurred over the past week and there is increased pain. No drainage noted. 7/17; patient presents for follow-up. She has been using Medihoney to the wound bed. She completed her course of Bactrim. She reports improvement in symptoms. 7/24; Patient presents for follow up. She has been using Medihoney to the wound bed without issues. She completed another course of Bactrim. She reports improvement in her symptoms but still has some mild tenderness to the medial aspect of the foot. 7/31; Patient presents for follow-up. She has been using Medihoney and Dakin's to the wound bed. She denies signs of infection. 8/14; patient presents for follow-up. She has been using Dakin's wet-to-dry packing strips to the right medial aspect of the amputation site and Medihoney to the anterior site. She has no issues or complaints today. She has started physical therapy. She denies signs of infection. 8/29; patient presents for follow-up. She has been using Dakin's wet-to-dry packing strips to the right medial aspect of the amputation site however this is becoming more difficult to place. She did report that she had increased redness and swelling to that site and developed some drainage. It has resolved. She continues with physical therapy. 9/8; patient presents for follow-up. We have been using silver alginate to the tunneled area. She completed her course of antibiotics. She reports no pain, increased swelling or erythema. 9/21; patient presents for follow-up. She has been using gentamicin to the tunneled area. She has no issues or complaints today. She denies signs of infection. 10/2; patient presents for follow-up. She is  scheduled to have her CT scan on 10/9. She currently denies signs of infection. She denies increased warmth, erythema or purulent drainage from the wound bed. She has been using Dakin's wet-to-dry dressings. 10/12; patient presents for follow-up. She had her CT scan on 10/9 that showed A small irregular rim-enhancing fluid pocket communicating to the overlying soft tissues of the sinus tract compatible with a small abscess. Currently she denies systemic signs of infection. She has been doing Dakin's wet-to-dry packing strips but it is hard for her to pack into the narrow opening. 10/30; patient presents for follow-up. Since last clinic visit she has had OR debridement of her Right foot by Dr. Odis Hollingshead Due to abscess noted on CT. She has been using iodoform packing. 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 She will complete 2 weeks of this and continue with oral amoxicillin for the next 6 to 12 months. She follows with Dr. Luciana Axe for this. She currently denies signs of infection. 11/13; patient presents for follow-up. Patient has been using silver alginate with gentamicin to the wound bed. She has no issues or complaints today she. She reports improvement in wound healing. 12/4; patient presents for follow-up. She has been using silver alginate and gentamicin to the wound bed. She has no issues or complaints today. 12/8; patient presents for follow-up. She has been using silver alginate with gentamicin to the wound bed. She presents for her first cast placement. She will be back early next week for her obligatory cast change. 12/12; patient presents for follow-up. We have been using collagen with antibiotic ointment to the wound bed under a total contact cast. She has tolerated this well. She is improved in wound healing. 12/18; patient presents for follow-up. We have been using collagen with antibiotic ointment  under the total contact cast. She has no  complaints today. 1/2; The cast was placed at last clinic visit however patient missed her follow-up and this was taken off last week at a nurse visit. Patient has been using packing strips to the medial narrowed wound. She has noted no drainage. 1/22; patient presents for follow-up. She has been keeping the area covered. She reports no drainage. She has had no issues to the previous wound site. She denies any signs of infection including increased warmth, erythema or purulent drainage. 08/08/2022 [name] is a 42 year old female with a past medical history of insulin-dependent uncontrolled type 2 diabetes with last hemoglobin A1c of 8.8, chronic osteomyelitis of the right foot status post transmetatarsal amputation on 12/18/2020 that has been previously treated for surgical dehiscence of this site in our clinic. She presents today 6 months after discharge from our clinic due to healed right foot wound with now reopening of the previous surgical site. She reports chronic pain to the site however denies purulent drainage, increased warmth or erythema. She noticed the wound opening about 1 month ago. She has been doing physical therapy without significant issues. She has inserts to her shoe however she has been advised to follow-up with [facility] for new custom inserts by her orthopedic surgeon now that she has a new wound. She states she will try and do this. She has been using antibiotic ointment to the wound bed. 8/1; patient presents for follow-up. She has been taking her antibiotics without issues. She has been using Dakin's wet-to-dry dressings to the wound bed. The wound is smaller. She denies systemic signs of infection. Patient History Information obtained from Patient. Family History Cancer - Paternal Grandparents, Diabetes - Mother, Hypertension - Mother, Stroke - Maternal Grandparents, No family history of Heart Disease, Hereditary Spherocytosis, Kidney Disease, Lung Disease,  Seizures, Thyroid Problems, Tuberculosis. Social History Never smoker, Marital Status - Single, Alcohol Use - Rarely, Drug Use - Prior History - Marijuana, Caffeine Use - Daily. Medical History Cardiovascular Patient has history of Deep Vein Thrombosis, Hypertension Endocrine Patient has history of Type II Diabetes - 8.8 HgbA1c 5 months ago Musculoskeletal Patient has history of Osteomyelitis - Right Transmet 12/18/20 [name], [name] ([id number]) [id number] Page 8 of 10 Neurologic Patient has history of Neuropathy Hospitalization/Surgery History - inpatient right foot abscess 10/12-10/18/2023. - 2022 right foot transmet. Objective Constitutional respirations regular, non-labored and within target range for patient.. Vitals Time Taken: 10:40 AM, Temperature: 98 F, Pulse: 80 bpm, Respiratory Rate: 18 breaths/min, Blood Pressure: 107/74 mmHg. Cardiovascular 2+ dorsalis pedis/posterior tibialis pulses. Psychiatric pleasant and cooperative. General Notes: T the right foot there is an open wound Along the transmetatarsal previous amputation site with nonviable tissue and postdebridement o granulation tissue present. Does not probe to bone. No tenderness on palpation. No purulent drainage or fluctuance noted. No increased warmth or erythema. Integumentary (Hair, Skin) Wound #4 status is Open. Original cause of wound was Shear/Friction. The date acquired was: 07/08/2022. The wound has been in treatment 1 weeks. The wound is located on the Right Amputation Site - Transmetatarsal. The wound measures 1.2cm length x 0.2cm width x 0.5cm depth; 0.188cm^2 area and 0.094cm^3 volume. There is Fat Layer (Subcutaneous Tissue) exposed. There is no tunneling or undermining noted. There is a medium amount of serosanguineous drainage noted. The wound margin is distinct with the outline attached to the wound base. There is small (1-33%) pink granulation within the wound bed. There is  a large (67-100%) amount of  necrotic tissue within the wound bed including Eschar and Adherent Slough. The periwound skin appearance exhibited: Callus. The periwound skin appearance did not exhibit: Crepitus, Excoriation, Induration, Rash, Scarring, Dry/Scaly, Maceration, Atrophie Blanche, Cyanosis, Ecchymosis, Hemosiderin Staining, Mottled, Pallor, Rubor, Erythema. The periwound has tenderness on palpation. Assessment Active Problems ICD-10 Non-pressure chronic ulcer of other part of right foot with fat layer exposed Type 2 diabetes mellitus with foot ulcer Other chronic osteomyelitis, right ankle and foot Patient's wound has shown improvement in size and appearance since last clinic visit. I debrided nonviable tissue. At this time I recommended stopping Dakin's wet-to-dry dressings and moving toward silver alginate. Can use gentamicin ointment with this. Continue aggressive offloading. Continue to hold PT Complete oral antibiotic course. . Procedures Wound #4 Pre-procedure diagnosis of Wound #4 is a Diabetic Wound/Ulcer of the Lower Extremity located on the Right Amputation Site - Transmetatarsal .Severity of Tissue Pre Debridement is: Fat layer exposed. There was a Excisional Skin/Subcutaneous Tissue Debridement with a total area of 0.19 sq cm performed by [name] Corwin, DO. With the following instrument(s): Curette to remove Viable and Non-Viable tissue/material. Material removed includes Callus, Subcutaneous Tissue, Slough, Skin: Dermis, and Skin: Epidermis. No specimens were taken. A time out was conducted at 11:07, prior to the start of the procedure. A Minimum amount of bleeding was controlled with Pressure. The procedure was tolerated well with a pain level of 0 throughout and a pain level of 0 following the procedure. Post Debridement Measurements: 1.2cm length x 0.2cm width x 0.5cm depth; 0.094cm^3 volume. Character of Wound/Ulcer Post Debridement is improved. Severity of Tissue  Post Debridement is: Fat layer exposed. Post procedure Diagnosis Wound #4: Same as Pre-Procedure General Notes: Scribed for [name] by [name]. Plan Follow-up Appointments: Return Appointment in 1 week. - [name] Thursday 1:15PM on 08/22/2022 room 9 Return Appointment in 2 weeks. - [name] Room 9 Thursday 08/29/22 at 11am Other: - Vascular and vein appointment 08/21/22. T oral antibiotics call [facility] to get shoe adjusted. [phone] [address], [address] [place], [place] Elbert Ewings ([id number]) [phone].pdf Page 9 of 10 Anesthetic: (In clinic) Topical Lidocaine 4% applied to wound bed Bathing/ Shower/ Hygiene: May shower with protection but do not get wound dressing(s) wet. Protect dressing(s) with water repellant cover (for example, large plastic bag) or a cast cover and may then take shower. Edema Control - Lymphedema / SCD / Other: Elevate legs to the level of the heart or above for 30 minutes daily and/or when sitting for 3-4 times a day throughout the day. Avoid standing for long periods of time. Off-Loading: Open toe surgical shoe to: WOUND #4: - Amputation Site - Transmetatarsal Wound Laterality: Right Topical: Gentamicin 1 x Per Day/30 Days Discharge Instructions: As directed by physician Prim Dressing: Maxorb Extra Ag+ Alginate Dressing, 2x2 (in/in) 1 x Per Day/30 Days ary Discharge Instructions: Apply to wound bed as instructed Secondary Dressing: ABD Pad, 8x10 1 x Per Day/30 Days Discharge Instructions: Apply over primary dressing as directed. Secured With: [employer], 4.5x3.1 (in/yd) 1 x Per Day/30 Days Discharge Instructions: Secure with Kerlix as directed. Secured With: 16M Medipore H Soft Cloth Surgical T ape, 4 x 10 (in/yd) 1 x Per Day/30 Days Discharge Instructions: Secure with tape as directed. Secured With: ace wrap 1 x Per Day/30 Days Discharge Instructions: apply lightly to hold dressing in place. 1. In office  sharp debridement 2. Silver alginate with gentamicin ointment 3. Aggressive offloadingsurgical shoe 4. Continue oral antibiotics. Electronic Signature(s)  Signed: 08/15/2022 4:31:19 PM By: [name] Corwin DO Entered By: [name] on 08/15/2022 11:37:37 -------------------------------------------------------------------------------- HxROS Details Patient Name: Date of Service: [name] V IS, MA [name]. 08/15/2022 10:15 A M Medical Record Number: [id number] Patient Account Number: [id number] Date of Birth/Sex: Treating RN: 04-11-1980 (42 y.o. F) Primary Care Provider: Gwinda [name] Other Clinician: Referring Provider: Treating Provider/Extender: [name] in Treatment: 1 Information Obtained From Patient Cardiovascular Medical History: Positive for: Deep Vein Thrombosis; Hypertension Endocrine Medical History: Positive for: Type II Diabetes - 8.8 HgbA1c 5 months ago Time with diabetes: Dx 2009 Treated with: Insulin, Oral agents Blood sugar tested every day: Yes Tested : daily Musculoskeletal Medical History: Positive for: Osteomyelitis - Right Transmet 12/18/20 Neurologic Medical History: Positive for: Neuropathy [place], Kooper [name] ([id number]) [id number] Page 10 of 10 Immunizations Pneumococcal Vaccine: Received Pneumococcal Vaccination: No Implantable Devices Yes Hospitalization / Surgery History Type of Hospitalization/Surgery inpatient right foot abscess 10/12-10/18/2023 2022 right foot transmet Family and Social History Cancer: Yes - Paternal Grandparents; Diabetes: Yes - Mother; Heart Disease: No; Hereditary Spherocytosis: No; Hypertension: Yes - Mother; Kidney Disease: No; Lung Disease: No; Seizures: No; Stroke: Yes - Maternal Grandparents; Thyroid Problems: No; Tuberculosis: No; Never smoker; Marital Status - Single; Alcohol Use: Rarely; Drug Use: Prior History - Marijuana; Caffeine Use: Daily; Financial  Concerns: No; Food, Clothing or Shelter Needs: No; Support System Lacking: No; Transportation Concerns: No Electronic Signature(s) Signed: 08/15/2022 4:31:19 PM By: [name] Corwin DO Entered By: [name] on 08/15/2022 11:33:17 -------------------------------------------------------------------------------- SuperBill Details Patient Name: Date of Service: [name] V IS, MA [name]. 08/15/2022 Medical Record Number: [id number] Patient Account Number: [id number] Date of Birth/Sex: Treating RN: 11-07-1980 (42 y.o. F) Primary Care Provider: Gwinda [name] Other Clinician: Referring Provider: Treating Provider/Extender: [name] in Treatment: 1 Diagnosis Coding ICD-10 Codes Code Description [phone] Non-pressure chronic ulcer of other part of right foot with fat layer exposed E11.621 Type 2 diabetes mellitus with foot ulcer M86.671 Other chronic osteomyelitis, right ankle and foot Facility Procedures : CPT4 Code: [id number] Description: 11042 - DEB SUBQ TISSUE 20 SQ CM/< ICD-10 Diagnosis Description L97.512 Non-pressure chronic ulcer of other part of right foot with fat layer exposed E11.621 Type 2 diabetes mellitus with foot ulcer M86.671 Other chronic osteomyelitis, right  ankle and foot Modifier: Quantity: 1 Physician Procedures : CPT4 Code Description Modifier [id number] 11042 - WC PHYS SUBQ TISS 20 SQ CM ICD-10 Diagnosis Description L97.512 Non-pressure chronic ulcer of other part of right foot with fat layer exposed E11.621 Type 2 diabetes mellitus with foot ulcer M86.671 Other  chronic osteomyelitis, right ankle and foot Quantity: 1 Electronic Signature(s) Signed: 08/15/2022 4:31:19 PM By: [name] Corwin DO Entered By: [name] on 08/15/2022 11:37:46

## 2022-08-21 ENCOUNTER — Other Ambulatory Visit (HOSPITAL_COMMUNITY): Payer: Self-pay | Admitting: Internal Medicine

## 2022-08-21 ENCOUNTER — Ambulatory Visit (HOSPITAL_COMMUNITY): Admission: RE | Admit: 2022-08-21 | Payer: Medicaid Other | Source: Ambulatory Visit

## 2022-08-21 ENCOUNTER — Ambulatory Visit (HOSPITAL_COMMUNITY)
Admission: RE | Admit: 2022-08-21 | Discharge: 2022-08-21 | Disposition: A | Discharge status: Home / Self Care | Payer: Medicaid Other | Source: Ambulatory Visit | Attending: Vascular Surgery | Admitting: Vascular Surgery

## 2022-08-21 DIAGNOSIS — S91301A Unspecified open wound, right foot, initial encounter: Secondary | ICD-10-CM | POA: Diagnosis not present

## 2022-08-21 DIAGNOSIS — E11621 Type 2 diabetes mellitus with foot ulcer: Secondary | ICD-10-CM

## 2022-08-21 DIAGNOSIS — L97509 Non-pressure chronic ulcer of other part of unspecified foot with unspecified severity: Secondary | ICD-10-CM | POA: Insufficient documentation

## 2022-08-21 LAB — VAS US ABI WITH/WO TBI
Left ABI: 0.99
Right ABI: 1.02

## 2022-08-22 ENCOUNTER — Ambulatory Visit (HOSPITAL_BASED_OUTPATIENT_CLINIC_OR_DEPARTMENT_OTHER): Payer: Medicaid Other | Admitting: Internal Medicine

## 2022-08-27 ENCOUNTER — Encounter (HOSPITAL_BASED_OUTPATIENT_CLINIC_OR_DEPARTMENT_OTHER): Payer: Medicaid Other | Admitting: Internal Medicine

## 2022-08-27 DIAGNOSIS — L97512 Non-pressure chronic ulcer of other part of right foot with fat layer exposed: Secondary | ICD-10-CM | POA: Diagnosis not present

## 2022-08-27 DIAGNOSIS — E11621 Type 2 diabetes mellitus with foot ulcer: Secondary | ICD-10-CM

## 2022-08-27 NOTE — Progress Notes (Signed)
BENNY, SLOTA ([id number]) [id number] Page 1 of 8 Visit Report for 08/27/2022 Arrival Information Details Patient Name: Date of Service: [name] [name], [place] [name] 08/27/2022 1:15 PM Medical Record Number: [id number] Patient Account Number: [id number] Date of Birth/Sex: Treating RN: February 16, 1980 (42 y.o. Katrinka Blazing Primary Care Ison Wichmann: Gwinda Passe Other Clinician: Referring Melenda Bielak: Treating Fong Mccarry/Extender: Grace Isaac in Treatment: 2 Visit Information History Since Last Visit Added or deleted any medications: No Patient Arrived: Wheel Chair Any new allergies or adverse reactions: No Arrival Time: 13:30 Had a fall or experienced change in No Accompanied By: self activities of daily living that may affect Transfer Assistance: None risk of falls: Patient Identification Verified: Yes Signs or symptoms of abuse/neglect since last visito No Patient Requires Transmission-Based Precautions: No Hospitalized since last visit: No Patient Has Alerts: Yes Implantable device outside of the clinic excluding No Patient Alerts: Patient on Blood Thinner cellular tissue based products placed in the center Eliquis since last visit: ABI R 1.02 (08/21/22) Pain Present Now: Yes ABI L 0.99 (08/21/22) Electronic Signature(s) Signed: 08/27/2022 4:33:18 PM By: Karie Schwalbe RN Entered By: Karie Schwalbe on 08/27/2022 13:37:26 -------------------------------------------------------------------------------- Encounter Discharge Information Details Patient Name: Date of Service: [name] V IS, [name] 08/27/2022 1:15 PM Medical Record Number: [id number] Patient Account Number: [id number] Date of Birth/Sex: Treating RN: February 01, 1980 (42 y.o. Katrinka Blazing Primary Care Michaila Kenney: Gwinda Passe Other Clinician: Referring Nelissa Bolduc: Treating Carole Deere/Extender: Grace Isaac in Treatment: 2 Encounter Discharge  Information Items Post Procedure Vitals Discharge Condition: Stable Temperature (F): 98.6 Ambulatory Status: Wheelchair Pulse (bpm): 75 Discharge Destination: Home Respiratory Rate (breaths/min): 18 Transportation: Private Auto Blood Pressure (mmHg): 109/75 Accompanied By: self Schedule Follow-up Appointment: Yes Clinical Summary of Care: Patient Declined Electronic Signature(s) Signed: 08/27/2022 4:33:18 PM By: Karie Schwalbe RN Entered By: Karie Schwalbe on 08/27/2022 15:43:19 Beavers, Sharol Harness ([id number]) [id number] Page 2 of 8 -------------------------------------------------------------------------------- Lower Extremity Assessment Details Patient Name: Date of Service: [name] IS, [name] 08/27/2022 1:15 PM Medical Record Number: [id number] Patient Account Number: [id number] Date of Birth/Sex: Treating RN: 1980/05/09 (42 y.o. Katrinka Blazing Primary Care Jarissa Sheriff: Gwinda Passe Other Clinician: Referring Graylen Noboa: Treating Nycere Presley/Extender: Tobie Poet Weeks in Treatment: 2 Edema Assessment Assessed: [Left: No] [Right: No] Edema: [Left: Ye] [Right: s] Calf Left: Right: Point of Measurement: 31 cm From Medial Instep 51 cm Ankle Left: Right: Point of Measurement: 9 cm From Medial Instep 30 cm Vascular Assessment Pulses: Dorsalis Pedis Palpable: [Right:Yes] Extremity colors, hair growth, and conditions: Extremity Color: [Right:Hyperpigmented] Hair Growth on Extremity: [Right:No] Temperature of Extremity: [Right:Warm] Capillary Refill: [Right:< 3 seconds] Dependent Rubor: [Right:No Yes] Electronic Signature(s) Signed: 08/27/2022 4:33:18 PM By: Karie Schwalbe RN Entered By: Karie Schwalbe on 08/27/2022 13:36:49 -------------------------------------------------------------------------------- Multi Wound Chart Details Patient Name: Date of Service: [name] V IS, [name] 08/27/2022 1:15 PM Medical Record Number:  [id number] Patient Account Number: [id number] Date of Birth/Sex: Treating RN: 03/03/1980 (42 y.o. F) Primary Care Madeeha Costantino: Gwinda Passe Other Clinician: Referring Kashari Chalmers: Treating Nealie Mchatton/Extender: Grace Isaac in Treatment: 2 Vital Signs Height(in): 69 Pulse(bpm): 75 Weight(lbs): 330 Blood Pressure(mmHg): 109/75 Body Mass Index(BMI): 48.7 Temperature(F): 98.6 Respiratory Rate(breaths/min): 18 [4:Photos:] [N/A:N/A] Right Amputation Site - N/A N/A Wound Location: Transmetatarsal Shear/Friction N/A N/A Wounding Event: Diabetic Wound/Ulcer of the Lower N/A N/A Primary Etiology: Extremity Dehisced Wound N/A N/A Secondary Etiology: Deep Vein Thrombosis, Hypertension, N/A N/A Comorbid History: Type II Diabetes, Osteomyelitis, Neuropathy 07/08/2022 N/A N/A  Date Acquired: 2 N/A N/A Weeks of Treatment: Open N/A N/A Wound Status: No N/A N/A Wound Recurrence: 1x0.2x0.5 N/A N/A Measurements L x W x D (cm) 0.157 N/A N/A A (cm) : rea 0.079 N/A N/A Volume (cm) : 28.60% N/A N/A % Reduction in A rea: 55.10% N/A N/A % Reduction in Volume: Grade 3 N/A N/A Classification: Medium N/A N/A Exudate A mount: Serosanguineous N/A N/A Exudate Type: red, brown N/A N/A Exudate Color: Distinct, outline attached N/A N/A Wound Margin: Small (1-33%) N/A N/A Granulation A mount: Pink N/A N/A Granulation Quality: Large (67-100%) N/A N/A Necrotic A mount: Eschar, Adherent Slough N/A N/A Necrotic Tissue: Fat Layer (Subcutaneous Tissue): Yes N/A N/A Exposed Structures: Fascia: No Tendon: No Muscle: No Joint: No Bone: No Small (1-33%) N/A N/A Epithelialization: Debridement - Excisional N/A N/A Debridement: Pre-procedure Verification/Time Out 14:10 N/A N/A Taken: Lidocaine 5% topical ointment N/A N/A Pain Control: Callus, Subcutaneous, Slough N/A N/A Tissue Debrided: Skin/Subcutaneous Tissue N/A N/A Level: 0.16 N/A N/A Debridement A (sq  cm): rea Curette N/A N/A Instrument: Minimum N/A N/A Bleeding: Pressure N/A N/A Hemostasis A chieved: 0 N/A N/A Procedural Pain: 0 N/A N/A Post Procedural Pain: Procedure was tolerated well N/A N/A Debridement Treatment Response: 1x0.2x0.5 N/A N/A Post Debridement Measurements L x W x D (cm) 0.079 N/A N/A Post Debridement Volume: (cm) Callus: Yes N/A N/A Periwound Skin Texture: Excoriation: No Induration: No Crepitus: No Rash: No Scarring: No Maceration: No N/A N/A Periwound Skin Moisture: Dry/Scaly: No Atrophie Blanche: No N/A N/A Periwound Skin Color: Cyanosis: No Ecchymosis: No Erythema: No Hemosiderin Staining: No Mottled: No Pallor: No Rubor: No Yes N/A N/A Tenderness on Palpation: Debridement N/A N/A Procedures Performed: Treatment Notes Electronic Signature(s) Signed: 08/27/2022 4:37:08 PM By: Geralyn Corwin DO Entered By: Geralyn Corwin on 08/27/2022 14:20:19 Lamprecht, Sharol Harness ([id number]) [id number] Page 4 of 8 -------------------------------------------------------------------------------- Multi-Disciplinary Care Plan Details Patient Name: Date of Service: [name], [place] [name] 08/27/2022 1:15 PM Medical Record Number: [id number] Patient Account Number: [id number] Date of Birth/Sex: Treating RN: [birth year] (42 y.o. Katrinka Blazing Primary Care Trenna Kiely: Gwinda Passe Other Clinician: Referring Heli Dino: Treating Olumide Dolinger/Extender: Grace Isaac in Treatment: 2 Active Inactive Necrotic Tissue Nursing Diagnoses: Knowledge deficit related to management of necrotic/devitalized tissue Goals: Necrotic/devitalized tissue will be minimized in the wound bed Date Initiated: 08/08/2022 Target Resolution Date: 10/14/2022 Goal Status: Active Patient/caregiver will verbalize understanding of reason and process for debridement of necrotic tissue Date Initiated: 08/08/2022 Target Resolution Date:  10/14/2022 Goal Status: Active Interventions: Assess patient pain level pre-, during and post procedure and prior to discharge Provide education on necrotic tissue and debridement process Notes: Nutrition Nursing Diagnoses: Impaired glucose control: actual or potential Goals: Patient/caregiver agrees to and verbalizes understanding of need to obtain nutritional consultation Date Initiated: 08/08/2022 Target Resolution Date: 09/23/2022 Goal Status: Active Patient/caregiver will maintain therapeutic glucose control Date Initiated: 08/08/2022 Target Resolution Date: 09/23/2022 Goal Status: Active Interventions: Assess HgA1c results as ordered upon admission and as needed Provide education on elevated blood sugars and impact on wound healing Provide education on nutrition Treatment Activities: Obtain HgA1c : 08/08/2022 Patient referred to Primary Care Physician for further nutritional evaluation : 08/08/2022 Notes: Orientation to the Wound Care Program Nursing Diagnoses: Knowledge deficit related to the wound healing center program Goals: Patient/caregiver will verbalize understanding of the Wound Healing Center Program Date Initiated: 08/08/2022 Target Resolution Date: 10/14/2022 Goal Status: Active Interventions: Provide education on orientation to the wound center Notes: Lucus, Viviana L (  [id number]) [id number] Page 5 of 8 Pain, Acute or Chronic Nursing Diagnoses: Pain, acute or chronic: actual or potential Potential alteration in comfort, pain Goals: Patient will verbalize adequate pain control and receive pain control interventions during procedures as needed Date Initiated: 08/08/2022 Target Resolution Date: 10/14/2022 Goal Status: Active Patient/caregiver will verbalize comfort level met Date Initiated: 08/08/2022 Target Resolution Date: 10/14/2022 Goal Status: Active Interventions: Complete pain assessment as per visit requirements Encourage patient  to take pain medications as prescribed Provide education on pain management Treatment Activities: Administer pain control measures as ordered : 08/08/2022 Notes: Wound/Skin Impairment Nursing Diagnoses: Knowledge deficit related to ulceration/compromised skin integrity Goals: Patient/caregiver will verbalize understanding of skin care regimen Date Initiated: 08/08/2022 Target Resolution Date: 10/14/2022 Goal Status: Active Ulcer/skin breakdown will heal within 14 weeks Date Initiated: 08/08/2022 Target Resolution Date: 10/14/2022 Goal Status: Active Interventions: Assess patient/caregiver ability to perform ulcer/skin care regimen upon admission and as needed Assess ulceration(s) every visit Provide education on ulcer and skin care Treatment Activities: Skin care regimen initiated : 08/08/2022 Topical wound management initiated : 08/08/2022 Notes: Electronic Signature(s) Signed: 08/27/2022 4:33:18 PM By: Karie Schwalbe RN Entered By: Karie Schwalbe on 08/27/2022 15:40:08 -------------------------------------------------------------------------------- Pain Assessment Details Patient Name: Date of Service: [name] IS, [name] 08/27/2022 1:15 PM Medical Record Number: [id number] Patient Account Number: [id number] Date of Birth/Sex: Treating RN: 02-Mar-1980 (42 y.o. Katrinka Blazing Primary Care Larri Brewton: Gwinda Passe Other Clinician: Referring Ladarien Beeks: Treating Margues Filippini/Extender: Grace Isaac in Treatment: 2 Active Problems Location of Pain Severity and Description of Pain Patient Has Paino Yes Site Locations Pain LocationRAYLEN, PRUETER ([id number]) [id number] Page 6 of 8 Pain Location: Generalized Pain With Dressing Change: No Duration of the Pain. Constant / Intermittento Constant Rate the pain. Current Pain Level: 2 Worst Pain Level: 10 Least Pain Level: 0 Tolerable Pain Level: 4 Character of Pain Describe  the Pain: Difficult to Pinpoint Pain Management and Medication Current Pain Management: Medication: Yes Cold Application: No Rest: Yes Massage: No Activity: No T.E.N.S.: No Heat Application: No Leg drop or elevation: No [name] the Current Pain Management Adequate: Adequate How does your wound impact your activities of daily livingo Sleep: No Bathing: No Appetite: No Relationship With Others: No Bladder Continence: No Emotions: No Bowel Continence: No Work: No Toileting: No Drive: No Dressing: No Hobbies: No Electronic Signature(s) Signed: 08/27/2022 4:33:18 PM By: Karie Schwalbe RN Entered By: Karie Schwalbe on 08/27/2022 13:32:54 -------------------------------------------------------------------------------- Patient/Caregiver Education Details Patient Name: Date of Service: [name] [name], [name] 8/13/2024andnbsp1:15 PM Medical Record Number: [id number] Patient Account Number: [id number] Date of Birth/Gender: Treating RN: 11/03/1980 (42 y.o. Katrinka Blazing Primary Care Physician: Gwinda Passe Other Clinician: Referring Physician: Treating Physician/Extender: Grace Isaac in Treatment: 2 Education Assessment Education Provided To: Patient Education Topics Provided Wound/Skin Impairment: Methods: Explain/Verbal Responses: Return demonstration correctly Electronic Signature(s) Signed: 08/27/2022 4:33:18 PM By: Karie Schwalbe RN Kienle, Hatice L ([id number]) [id number] Page 7 of 8 Entered By: Karie Schwalbe on 08/27/2022 15:40:27 -------------------------------------------------------------------------------- Wound Assessment Details Patient Name: Date of Service: [name] IS, [name] 08/27/2022 1:15 PM Medical Record Number: [id number] Patient Account Number: [id number] Date of Birth/Sex: Treating RN: [birth year] (42 y.o. 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 Secondary Dehisced Wound Wounding  Event: Shear/Friction Etiology: Date Acquired: 07/08/2022 Wound Status: Open Weeks Of Treatment: 2 Comorbid Deep Vein Thrombosis, Hypertension, Type II Diabetes, Clustered Wound: No History: Osteomyelitis, Neuropathy Photos Wound Measurements Length: (cm) 1 Width: (cm) 0.2 Depth: (cm) 0.5 Area: (cm) 0.157 Volume: (cm) 0.079 % Reduction in Area: 28.6% % Reduction in Volume: 55.1% Epithelialization: Small (1-33%) Tunneling: No Undermining: No Wound Description Classification: Grade 3 Wound Margin: Distinct, outline attached Exudate Amount: Medium Exudate Type: Serosanguineous Exudate Color: red, brown Foul Odor After Cleansing: No Slough/Fibrino Yes Wound Bed Granulation Amount: Small (1-33%) Exposed Structure Granulation Quality: Pink Fascia Exposed: No Necrotic Amount: Large (67-100%) Fat Layer (Subcutaneous Tissue) Exposed: Yes Necrotic Quality: Eschar, Adherent Slough Tendon Exposed: No Muscle Exposed: No Joint Exposed: No Bone Exposed: No Periwound Skin Texture Texture Color No Abnormalities Noted: No No Abnormalities Noted: No Callus: Yes Atrophie Blanche: No Crepitus: No Cyanosis: No Excoriation: No Ecchymosis: No Induration: No Erythema: No Rash: No Hemosiderin Staining: No Scarring: No Mottled: No Pallor: No Moisture Klausing, Wilmoth L ([id number]) [id number] Page 8 of 8 Rubor: No No Abnormalities Noted: No Dry / Scaly: No Temperature / Pain Maceration: No Tenderness on Palpation: Yes Treatment Notes Wound #4 (Amputation Site - Transmetatarsal) Wound Laterality: Right Cleanser Peri-Wound Care Topical Gentamicin Discharge Instruction: As  directed by physician Mupirocin Ointment Discharge Instruction: Apply Mupirocin (Bactroban) as instructed (in clinic) Primary Dressing Promogran Prisma Matrix, 4.34 (sq in) (silver collagen) Discharge Instruction: Moisten collagen with gentamicin Secondary Dressing ABD Pad, 8x10 Discharge Instruction: Apply over primary dressing as directed. Secured With [employer], 4.5x3.1 (in/yd) Discharge Instruction: Secure with Kerlix as directed. 31M Medipore H Soft Cloth Surgical T ape, 4 x 10 (in/yd) Discharge Instruction: Secure with tape as directed. ace wrap Discharge Instruction: apply lightly to hold dressing in place. Compression Wrap Compression Stockings Add-Ons Electronic Signature(s) Signed: 08/27/2022 4:33:18 PM By: Karie Schwalbe RN Entered By: Karie Schwalbe on 08/27/2022 13:43:24 -------------------------------------------------------------------------------- Vitals Details Patient Name: Date of Service: [name] V IS, [name] 08/27/2022 1:15 PM Medical Record Number: [id number] Patient Account Number: [id number] Date of Birth/Sex: Treating RN: 07/27/1980 (43 y.o. Katrinka Blazing Primary Care Jahnyla Parrillo: Gwinda Passe Other Clinician: Referring Desani Sprung: Treating Annora Guderian/Extender: Grace Isaac in Treatment: 2 Vital Signs Time Taken: 13:31 Temperature (F): 98.6 Height (in): 69 Pulse (bpm): 75 Weight (lbs): 330 Respiratory Rate (breaths/min): 18 Body Mass Index (BMI): 48.7 Blood Pressure (mmHg): 109/75 Reference Range: 80 - 120 mg / dl Electronic Signature(s) Signed: 08/27/2022 4:33:18 PM By: Karie Schwalbe RN Entered By: Karie Schwalbe on 08/27/2022 13:32:06

## 2022-08-27 NOTE — Progress Notes (Signed)
[name] ([id number]) [id number] Page 1 of 11 Visit Report for 08/27/2022 Chief Complaint Document Details Patient Name: Date of Service: [name] IS, [name]. 08/27/2022 1:15 PM Medical Record Number: [id number] Patient Account Number: [id number] Date of Birth/Sex: Treating RN: 04-16-1980 (42 y.o. F) Primary Care Provider: Gwinda Passe Other Clinician: Referring Provider: Treating Provider/Extender: Grace Isaac in Treatment: 2 Information Obtained from: Patient Chief Complaint 01/22/2021; Osteomyelitis of the right foot status post transmetatarsal amputation with surgical site dehiscence 08/08/2022; reopening of wound to the right foot with a history of transmetatarsal amputation with previous difficulty healing surgical site Electronic Signature(s) Signed: 08/27/2022 4:37:08 PM By: Geralyn Corwin DO Entered By: Geralyn Corwin on 08/27/2022 14:20:28 -------------------------------------------------------------------------------- Debridement Details Patient Name: Date of Service: [name]. 08/27/2022 1:15 PM Medical Record Number: [id number] Patient Account Number: [id number] Date of Birth/Sex: Treating RN: 1980-08-04 (42 y.o. Katrinka Blazing Primary Care Provider: Gwinda Passe Other Clinician: Referring Provider: Treating Provider/Extender: Grace Isaac in Treatment: 2 Debridement Performed for Assessment: Wound #4 Right Amputation Site - Transmetatarsal Performed By: Physician Geralyn Corwin, DO Debridement Type: Debridement Severity of Tissue Pre Debridement: Fat layer exposed Level of Consciousness (Pre-procedure): Awake and Alert Pre-procedure Verification/Time Out Yes - 14:10 Taken: Start Time: 14:10 Pain Control: Lidocaine 5% topical ointment Percent of Wound Bed Debrided: 100% T Area Debrided (cm): otal 0.16 Tissue and other material debrided: Viable,  Non-Viable, Callus, Slough, Subcutaneous, Slough Level: Skin/Subcutaneous Tissue Debridement Description: Excisional Instrument: Curette Bleeding: Minimum Hemostasis Achieved: Pressure End Time: 14:11 Procedural Pain: 0 Post Procedural Pain: 0 Response to Treatment: Procedure was tolerated well Level of Consciousness (Post- Awake and Alert procedure): Post Debridement Measurements of Total Wound Length: (cm) 1 Width: (cm) 0.2 Depth: (cm) 0.5 Volume: (cm) 0.079 [name] ([id number]) [id number] Page 2 of 11 Character of Wound/Ulcer Post Debridement: Improved Severity of Tissue Post Debridement: Fat layer exposed Post Procedure Diagnosis Same as Pre-procedure Notes Scribed for Dr. Mikey Bussing by J.Scotton Electronic Signature(s) Signed: 08/27/2022 4:33:18 PM By: Karie Schwalbe RN Signed: 08/27/2022 4:37:08 PM By: Geralyn Corwin DO Entered By: Karie Schwalbe on 08/27/2022 14:12:25 -------------------------------------------------------------------------------- HPI Details Patient Name: Date of Service: [name]. 08/27/2022 1:15 PM Medical Record Number: [id number] Patient Account Number: [id number] Date of Birth/Sex: Treating RN: 23-Feb-1980 (42 y.o. F) Primary Care Provider: Gwinda Passe Other Clinician: Referring Provider: Treating Provider/Extender: Grace Isaac in Treatment: 2 History of Present Illness HPI Description: Admission 01/22/2021 [name] is a 42 year old female with a past medical history of insulin-dependent uncontrolled type 2 diabetes with last hemoglobin A1c of 13.5, osteomyelitis of the right foot status post transmetatarsal amputation on 12/18/2020 that presents to the clinic for right foot wound. She has had dehiscence of the surgical site. She is currently using wet-to-dry dressings. She has a PICC line and receiving IV ceftriaxone daily for her osteomyelitis. There is an end  date of 01/27/2021. She is also taking oral metronidazole. She currently denies systemic signs of infection. 1/19; patient presents for follow-up. She was diagnosed with a DVT to the right lower extremity 2 days ago. She is on Eliquis now. She is scheduled to see her infectious disease doctor tomorrow. She has been using Dakin's wet-to-dry dressings. She denies systemic signs of infection. 1/26; patient presents for follow-up. She saw infectious disease on 1/21 started on Augmentin. Her PICC line and IV ceftriaxone was discontinued. Patient reports stability to her wound. She  has been using Dakin's wet-to-dry dressings. She currently denies systemic signs of infection. 2/3; patient presents for follow-up. She continues to use Dakin's wet-to-dry dressings to the wound bed. She saw Dr. Manson Passey with infectious disease yesterday and is continuing Augmentin. T entative end date is 2/16. Patient reports following up with orthopedics. She states there is no further plan from them. She currently denies systemic signs of infection. 2/10; patient presents for follow-up. She continues to use Dakin's wet to dry dressings. She is scheduled to have her MRI done on 2/14. She states that she had pain to the debridement site from last clinic visit and declines debridement today. She denies systemic signs of infection. She continues to have yellow thick drainage. 2/20; patient presents for follow-up. She continues to use Dakin's wet-to-dry dressings. She obtained her MRI. The results showed an abscess and she is scheduled to see her orthopedic surgeon on 2/23. She saw infectious disease 2/17 and her antibiotics were extended. She currently denies systemic signs of infection. 3/6; patient presents for follow-up. She had debridement and irrigation of her foot on 03/10/2021 due to abscess noted on MRI. She was started on IV ceftriaxone and oral Flagyl. She has no issues or complaints today. She has been using iodoform  packing to the tunnel and Dakin's wet-to-dry to the opening. 03/26/2021: She continues on IV ceftriaxone and oral metronidazole. She has follow-up with infectious disease tomorrow. No significant issues or complaints today. Her mother continues to help her with her wound dressing, using iodoform packing strips into the tunnel and Dakin's to the open portion of the wound. 3/20; patient presents for follow-up. She continues to be on IV ceftriaxone in oral metronidazole. She has been using iodoform to the tunnel and Dakin's wet-to- dry to the open wound. She denies signs of infection. 3/27; patient presents for follow-up. She no longer has a PICC line. She has been using iodoform to the tunnel and Dakin's wet-to-dry to the open wound. She reports improvement in wound healing. She denies signs of infection. 4/3; patient presents for follow-up. She states she has been using Hydrofera Blue to the open wound and iodoform packing to the tunnel without any issues. She denies signs of infection. 4/18; patient presents for follow-up. She saw infectious disease on 4/11. She has finished her oral antibiotics and completed a total of 6 weeks of antibiotics (this includes IV as well). No further antibiotics needed. She has been using Hydrofera Blue and iodoform packing. She states that the tunneled area has come in and the iodoform is not staying in place anymore. She has no issues or complaints today. She denies signs of infection. 4/24; patient presents for follow-up. She saw Dr. Carlene Coria, plastic surgery to discuss potential skin graft/substitute placement. At this time he thinks that the skin graft would likely not take. He is in agreement with trying a wound VAC. Patient has been using Hydrofera Blue dressing changes with no issues. She denies signs of infection. She reports improvement in wound healing. 5/1; patient presents for follow-up. Unfortunately patient did not have insurance when we ran for the pico.  There is an assistance program and we are trying to get this accommodated for the patient. In the meantime she has been using Hydrofera Blue without any issues. She denies signs of infection. EDILIA, NEWBROUGH ([id number]) [id number] Page 3 of 11 5/8; patient presents for follow-up. We have not heard back if pico is covered by her insurance. She has been using collagen to the wound bed  over the past week. She denies signs of infection. 5/18; patient presents for follow-up. She has been using collagen to the wound bed without issues. Again we have not heard if pico is covered by her insurance. She has no issues or complaints today. 5/23; patient presents for follow-up. She has been using collagen to the wound bed. She has no issues or complaints today. She obtained the wound VAC from [facility] and brought this in today. She denies signs of infection. 6/1; patient presents for follow-up. She has been using the wound VAC for the past week. She has had this changed twice since she was last here. She reports more maceration to the periwound. She denies signs of infection. 6/7; right TMA site. There are 2 wounds 0 separated by a bridge of healed tissue. The more lateral area has undermining. Both areas have healthy looking granulation at the base but relative the size of the wound is fairly deep. There is no exposed bone no evidence of infection. Her wound VAC was put on hold last week because of surrounding skin maceration she has been using collagen this week. She has a modified shoe 6/12; patient presents for follow-up. Last week the wound VAC was reinitiated. She had no issues with the wound VAC itself. Today she has maceration again noted to the surrounding skin. She denies signs of infection. 6/27; patient presents for follow-up. She has been using Medihoney to the wound bed. We took a break from the wound VAC because the periwound was macerated. She still has some areas of  maceration to the distal foot where there is a callus. She currently denies signs of infection. 7/11; patient presents for follow-up. She has been using Medihoney to the wound bed. She has developed some increased warmth and erythema to the lateral aspect of the right foot. She states this is occurred over the past week and there is increased pain. No drainage noted. 7/17; patient presents for follow-up. She has been using Medihoney to the wound bed. She completed her course of Bactrim. She reports improvement in symptoms. 7/24; Patient presents for follow up. She has been using Medihoney to the wound bed without issues. She completed another course of Bactrim. She reports improvement in her symptoms but still has some mild tenderness to the medial aspect of the foot. 7/31; Patient presents for follow-up. She has been using Medihoney and Dakin's to the wound bed. She denies signs of infection. 8/14; patient presents for follow-up. She has been using Dakin's wet-to-dry packing strips to the right medial aspect of the amputation site and Medihoney to the anterior site. She has no issues or complaints today. She has started physical therapy. She denies signs of infection. 8/29; patient presents for follow-up. She has been using Dakin's wet-to-dry packing strips to the right medial aspect of the amputation site however this is becoming more difficult to place. She did report that she had increased redness and swelling to that site and developed some drainage. It has resolved. She continues with physical therapy. 9/8; patient presents for follow-up. We have been using silver alginate to the tunneled area. She completed her course of antibiotics. She reports no pain, increased swelling or erythema. 9/21; patient presents for follow-up. She has been using gentamicin to the tunneled area. She has no issues or complaints today. She denies signs of infection. 10/2; patient presents for follow-up. She is  scheduled to have her CT scan on 10/9. She currently denies signs of infection. She denies increased warmth, erythema  or purulent drainage from the wound bed. She has been using Dakin's wet-to-dry dressings. 10/12; patient presents for follow-up. She had her CT scan on 10/9 that showed A small irregular rim-enhancing fluid pocket communicating to the overlying soft tissues of the sinus tract compatible with a small abscess. Currently she denies systemic signs of infection. She has been doing Dakin's wet-to-dry packing strips but it is hard for her to pack into the narrow opening. 10/30; patient presents for follow-up. Since last clinic visit she has had OR debridement of her Right foot by Dr. Odis Hollingshead Due to abscess noted on CT. She has been using iodoform packing. 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 She will complete 2 weeks of this and continue with oral amoxicillin for the next 6 to 12 months. She follows with Dr. Luciana Axe for this. She currently denies signs of infection. 11/13; patient presents for follow-up. Patient has been using silver alginate with gentamicin to the wound bed. She has no issues or complaints today she. She reports improvement in wound healing. 12/4; patient presents for follow-up. She has been using silver alginate and gentamicin to the wound bed. She has no issues or complaints today. 12/8; patient presents for follow-up. She has been using silver alginate with gentamicin to the wound bed. She presents for her first cast placement. She will be back early next week for her obligatory cast change. 12/12; patient presents for follow-up. We have been using collagen with antibiotic ointment to the wound bed under a total contact cast. She has tolerated this well. She is improved in wound healing. 12/18; patient presents for follow-up. We have been using collagen with antibiotic ointment under the total contact cast. She has no  complaints today. 1/2; The cast was placed at last clinic visit however patient missed her follow-up and this was taken off last week at a nurse visit. Patient has been using packing strips to the medial narrowed wound. She has noted no drainage. 1/22; patient presents for follow-up. She has been keeping the area covered. She reports no drainage. She has had no issues to the previous wound site. She denies any signs of infection including increased warmth, erythema or purulent drainage. 08/08/2022 Ms. Derrion Kotowski is a 42 year old female with a past medical history of insulin-dependent uncontrolled type 2 diabetes with last hemoglobin A1c of 8.8, chronic osteomyelitis of the right foot status post transmetatarsal amputation on 12/18/2020 that has been previously treated for surgical dehiscence of this site in our clinic. She presents today 6 months after discharge from our clinic due to healed right foot wound with now reopening of the previous surgical site. She reports chronic pain to the site however denies purulent drainage, increased warmth or erythema. She noticed the wound opening about 1 month ago. She has been doing physical therapy without significant issues. She has inserts to her shoe however she has been advised to follow-up with [facility] for new custom inserts by her orthopedic surgeon now that she has a new wound. She states she will try and do this. She has been using antibiotic ointment to the wound bed. 8/1; patient presents for follow-up. She has been taking her antibiotics without issues. She has been using Dakin's wet-to-dry dressings to the wound bed. The wound is smaller. She denies systemic signs of infection. 8/13; patient presents for follow-up. She had ABIs completed that were normal. On the right it was 1.02 and on the left was 0.99. She has been using  silver alginate to the wound bed with gentamicin ointment. She states it is hard to pack the wound bed. She currently denies  signs of infection. ADILENNE, AHLES ([id number]) [id number] Page 4 of 11 Electronic Signature(s) Signed: 08/27/2022 4:37:08 PM By: Geralyn Corwin DO Entered By: Geralyn Corwin on 08/27/2022 14:22:03 -------------------------------------------------------------------------------- Physical Exam Details Patient Name: Date of Service: [name]. 08/27/2022 1:15 PM Medical Record Number: [id number] Patient Account Number: [id number] Date of Birth/Sex: Treating RN: 06/26/1980 (42 y.o. F) Primary Care Provider: Gwinda Passe Other Clinician: Referring Provider: Treating Provider/Extender: Grace Isaac in Treatment: 2 Constitutional respirations regular, non-labored and within target range for patient.. Cardiovascular 2+ dorsalis pedis/posterior tibialis pulses. Psychiatric pleasant and cooperative. Notes T the right foot there is an open wound Along the transmetatarsal previous amputation site with nonviable tissue and postdebridement granulation tissue o present. Does not probe to bone. No tenderness on palpation. No purulent drainage or fluctuance noted. No increased warmth or erythema. Electronic Signature(s) Signed: 08/27/2022 4:37:08 PM By: Geralyn Corwin DO Entered By: Geralyn Corwin on 08/27/2022 14:22:37 -------------------------------------------------------------------------------- Physician Orders Details Patient Name: Date of Service: [name]. 08/27/2022 1:15 PM Medical Record Number: [id number] Patient Account Number: [id number] Date of Birth/Sex: Treating RN: [birth year] (42 y.o. Katrinka Blazing Primary Care Provider: Gwinda Passe Other Clinician: Referring Provider: Treating Provider/Extender: Grace Isaac in Treatment: 2 Verbal / Phone Orders: No Diagnosis Coding Follow-up Appointments ppointment in 1 week. - Dr. Mikey Bussing Friday 09/06/22 at  10:15am Return A room 9 Other: - Vascular and vein appointment 08/21/22. T oral antibiotics ake call [facility] to get shoe adjusted. [phone] [place],-[place]ONE Anesthetic (In clinic) Topical Lidocaine 4% applied to wound bed Bathing/ Shower/ Hygiene May shower with protection but do not get wound dressing(s) wet. Protect dressing(s) with water repellant cover (for example, large plastic bag) or a cast cover and may then take shower. Edema Control - Lymphedema / SCD / Other Elevate legs to the level of the heart or above for 30 minutes daily and/or when sitting for 3-4 times a day throughout the day. Avoid standing for long periods of time. RAEGAN, HAGGETT ([id number]) [id number] Page 5 of 11 Off-Loading Open toe surgical shoe to: Wound Treatment Wound #4 - Amputation Site - Transmetatarsal Wound Laterality: Right Topical: Gentamicin 1 x Per Day/30 Days Discharge Instructions: As directed by physician Topical: Mupirocin Ointment 1 x Per Day/30 Days Discharge Instructions: Apply Mupirocin (Bactroban) as instructed (in clinic) Prim Dressing: Promogran Prisma Matrix, 4.34 (sq in) (silver collagen) 1 x Per Day/30 Days ary Discharge Instructions: Moisten collagen with gentamicin Secondary Dressing: ABD Pad, 8x10 1 x Per Day/30 Days Discharge Instructions: Apply over primary dressing as directed. Secured With: [employer], 4.5x3.1 (in/yd) 1 x Per Day/30 Days Discharge Instructions: Secure with Kerlix as directed. Secured With: 59M Medipore H Soft Cloth Surgical T ape, 4 x 10 (in/yd) 1 x Per Day/30 Days Discharge Instructions: Secure with tape as directed. Secured With: ace wrap 1 x Per Day/30 Days Discharge Instructions: apply lightly to hold dressing in place. Electronic Signature(s) Signed: 08/27/2022 4:33:18 PM By: Karie Schwalbe RN Signed: 08/27/2022 4:37:08 PM By: Geralyn Corwin DO Entered By: Karie Schwalbe on 08/27/2022  14:23:07 -------------------------------------------------------------------------------- Problem List Details Patient Name: Date of Service: [name]. 08/27/2022 1:15 PM Medical Record Number: [id number] Patient Account Number: [id number] Date of Birth/Sex: Treating RN: 08/28/1980 (42 y.o. F) Primary Care  Provider: Gwinda Passe Other Clinician: Referring Provider: Treating Provider/Extender: Grace Isaac in Treatment: 2 Active Problems ICD-10 Encounter Code Description Active Date MDM Diagnosis L97.512 Non-pressure chronic ulcer of other part of right foot with fat layer exposed 08/08/2022 No Yes E11.621 Type 2 diabetes mellitus with foot ulcer 08/08/2022 No Yes M86.671 Other chronic osteomyelitis, right ankle and foot 08/08/2022 No Yes Inactive Problems Resolved Problems Electronic Signature(s) Signed: 08/27/2022 4:37:08 PM By: Geralyn Corwin DO Samudio, Kameisha L8/13/2024 4:37:08 PM By: Geralyn Corwin DO Signed: ([id number]) [id number] Page 6 of 11 Entered By: Geralyn Corwin on 08/27/2022 14:20:12 -------------------------------------------------------------------------------- Progress Note Details Patient Name: Date of Service: [name] [place], [name]. 08/27/2022 1:15 PM Medical Record Number: [id number] Patient Account Number: [id number] Date of Birth/Sex: Treating RN: 09-13-1980 (42 y.o. F) Primary Care Provider: Gwinda Passe Other Clinician: Referring Provider: Treating Provider/Extender: Grace Isaac in Treatment: 2 Subjective Chief Complaint Information obtained from Patient 01/22/2021; Osteomyelitis of the right foot status post transmetatarsal amputation with surgical site dehiscence 08/08/2022; reopening of wound to the right foot with a history of transmetatarsal amputation with previous difficulty healing surgical site History of Present Illness (HPI) Admission  01/22/2021 [name] is a 42 year old female with a past medical history of insulin-dependent uncontrolled type 2 diabetes with last hemoglobin A1c of 13.5, osteomyelitis of the right foot status post transmetatarsal amputation on 12/18/2020 that presents to the clinic for right foot wound. She has had dehiscence of the surgical site. She is currently using wet-to-dry dressings. She has a PICC line and receiving IV ceftriaxone daily for her osteomyelitis. There is an end date of 01/27/2021. She is also taking oral metronidazole. She currently denies systemic signs of infection. 1/19; patient presents for follow-up. She was diagnosed with a DVT to the right lower extremity 2 days ago. She is on Eliquis now. She is scheduled to see her infectious disease doctor tomorrow. She has been using Dakin's wet-to-dry dressings. She denies systemic signs of infection. 1/26; patient presents for follow-up. She saw infectious disease on 1/21 started on Augmentin. Her PICC line and IV ceftriaxone was discontinued. Patient reports stability to her wound. She has been using Dakin's wet-to-dry dressings. She currently denies systemic signs of infection. 2/3; patient presents for follow-up. She continues to use Dakin's wet-to-dry dressings to the wound bed. She saw Dr. Manson Passey with infectious disease yesterday and is continuing Augmentin. T entative end date is 2/16. Patient reports following up with orthopedics. She states there is no further plan from them. She currently denies systemic signs of infection. 2/10; patient presents for follow-up. She continues to use Dakin's wet to dry dressings. She is scheduled to have her MRI done on 2/14. She states that she had pain to the debridement site from last clinic visit and declines debridement today. She denies systemic signs of infection. She continues to have yellow thick drainage. 2/20; patient presents for follow-up. She continues to use Dakin's wet-to-dry  dressings. She obtained her MRI. The results showed an abscess and she is scheduled to see her orthopedic surgeon on 2/23. She saw infectious disease 2/17 and her antibiotics were extended. She currently denies systemic signs of infection. 3/6; patient presents for follow-up. She had debridement and irrigation of her foot on 03/10/2021 due to abscess noted on MRI. She was started on IV ceftriaxone and oral Flagyl. She has no issues or complaints today. She has been using iodoform packing to the tunnel and Dakin's wet-to-dry to the opening. 03/26/2021:  She continues on IV ceftriaxone and oral metronidazole. She has follow-up with infectious disease tomorrow. No significant issues or complaints today. Her mother continues to help her with her wound dressing, using iodoform packing strips into the tunnel and Dakin's to the open portion of the wound. 3/20; patient presents for follow-up. She continues to be on IV ceftriaxone in oral metronidazole. She has been using iodoform to the tunnel and Dakin's wet-to- dry to the open wound. She denies signs of infection. 3/27; patient presents for follow-up. She no longer has a PICC line. She has been using iodoform to the tunnel and Dakin's wet-to-dry to the open wound. She reports improvement in wound healing. She denies signs of infection. 4/3; patient presents for follow-up. She states she has been using Hydrofera Blue to the open wound and iodoform packing to the tunnel without any issues. She denies signs of infection. 4/18; patient presents for follow-up. She saw infectious disease on 4/11. She has finished her oral antibiotics and completed a total of 6 weeks of antibiotics (this includes IV as well). No further antibiotics needed. She has been using Hydrofera Blue and iodoform packing. She states that the tunneled area has come in and the iodoform is not staying in place anymore. She has no issues or complaints today. She denies signs of infection. 4/24;  patient presents for follow-up. She saw Dr. Carlene Coria, plastic surgery to discuss potential skin graft/substitute placement. At this time he thinks that the skin graft would likely not take. He is in agreement with trying a wound VAC. Patient has been using Hydrofera Blue dressing changes with no issues. She denies signs of infection. She reports improvement in wound healing. 5/1; patient presents for follow-up. Unfortunately patient did not have insurance when we ran for the pico. There is an assistance program and we are trying to get this accommodated for the patient. In the meantime she has been using Hydrofera Blue without any issues. She denies signs of infection. 5/8; patient presents for follow-up. We have not heard back if pico is covered by her insurance. She has been using collagen to the wound bed over the past week. She denies signs of infection. 5/18; patient presents for follow-up. She has been using collagen to the wound bed without issues. Again we have not heard if pico is covered by her insurance. She has no issues or complaints today. 5/23; patient presents for follow-up. She has been using collagen to the wound bed. She has no issues or complaints today. She obtained the wound VAC from [facility] and brought this in today. She denies signs of infection. RAYLENA, CORNWALL ([id number]) [id number] Page 7 of 11 6/1; patient presents for follow-up. She has been using the wound VAC for the past week. She has had this changed twice since she was last here. She reports more maceration to the periwound. She denies signs of infection. 6/7; right TMA site. There are 2 wounds 0 separated by a bridge of healed tissue. The more lateral area has undermining. Both areas have healthy looking granulation at the base but relative the size of the wound is fairly deep. There is no exposed bone no evidence of infection. Her wound VAC was put on hold last week because of surrounding  skin maceration she has been using collagen this week. She has a modified shoe 6/12; patient presents for follow-up. Last week the wound VAC was reinitiated. She had no issues with the wound VAC itself. Today she has maceration again noted to the  surrounding skin. She denies signs of infection. 6/27; patient presents for follow-up. She has been using Medihoney to the wound bed. We took a break from the wound VAC because the periwound was macerated. She still has some areas of maceration to the distal foot where there is a callus. She currently denies signs of infection. 7/11; patient presents for follow-up. She has been using Medihoney to the wound bed. She has developed some increased warmth and erythema to the lateral aspect of the right foot. She states this is occurred over the past week and there is increased pain. No drainage noted. 7/17; patient presents for follow-up. She has been using Medihoney to the wound bed. She completed her course of Bactrim. She reports improvement in symptoms. 7/24; Patient presents for follow up. She has been using Medihoney to the wound bed without issues. She completed another course of Bactrim. She reports improvement in her symptoms but still has some mild tenderness to the medial aspect of the foot. 7/31; Patient presents for follow-up. She has been using Medihoney and Dakin's to the wound bed. She denies signs of infection. 8/14; patient presents for follow-up. She has been using Dakin's wet-to-dry packing strips to the right medial aspect of the amputation site and Medihoney to the anterior site. She has no issues or complaints today. She has started physical therapy. She denies signs of infection. 8/29; patient presents for follow-up. She has been using Dakin's wet-to-dry packing strips to the right medial aspect of the amputation site however this is becoming more difficult to place. She did report that she had increased redness and swelling to that site  and developed some drainage. It has resolved. She continues with physical therapy. 9/8; patient presents for follow-up. We have been using silver alginate to the tunneled area. She completed her course of antibiotics. She reports no pain, increased swelling or erythema. 9/21; patient presents for follow-up. She has been using gentamicin to the tunneled area. She has no issues or complaints today. She denies signs of infection. 10/2; patient presents for follow-up. She is scheduled to have her CT scan on 10/9. She currently denies signs of infection. She denies increased warmth, erythema or purulent drainage from the wound bed. She has been using Dakin's wet-to-dry dressings. 10/12; patient presents for follow-up. She had her CT scan on 10/9 that showed A small irregular rim-enhancing fluid pocket communicating to the overlying soft tissues of the sinus tract compatible with a small abscess. Currently she denies systemic signs of infection. She has been doing Dakin's wet-to-dry packing strips but it is hard for her to pack into the narrow opening. 10/30; patient presents for follow-up. Since last clinic visit she has had OR debridement of her Right foot by Dr. Odis Hollingshead Due to abscess noted on CT. She has been using iodoform packing. 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 She will complete 2 weeks of this and continue with oral amoxicillin for the next 6 to 12 months. She follows with Dr. Luciana Axe for this. She currently denies signs of infection. 11/13; patient presents for follow-up. Patient has been using silver alginate with gentamicin to the wound bed. She has no issues or complaints today she. She reports improvement in wound healing. 12/4; patient presents for follow-up. She has been using silver alginate and gentamicin to the wound bed. She has no issues or complaints today. 12/8; patient presents for follow-up. She has been using silver alginate with  gentamicin to the wound bed.  She presents for her first cast placement. She will be back early next week for her obligatory cast change. 12/12; patient presents for follow-up. We have been using collagen with antibiotic ointment to the wound bed under a total contact cast. She has tolerated this well. She is improved in wound healing. 12/18; patient presents for follow-up. We have been using collagen with antibiotic ointment under the total contact cast. She has no complaints today. 1/2; The cast was placed at last clinic visit however patient missed her follow-up and this was taken off last week at a nurse visit. Patient has been using packing strips to the medial narrowed wound. She has noted no drainage. 1/22; patient presents for follow-up. She has been keeping the area covered. She reports no drainage. She has had no issues to the previous wound site. She denies any signs of infection including increased warmth, erythema or purulent drainage. 08/08/2022 [name] is a 42 year old female with a past medical history of insulin-dependent uncontrolled type 2 diabetes with last hemoglobin A1c of 8.8, chronic osteomyelitis of the right foot status post transmetatarsal amputation on 12/18/2020 that has been previously treated for surgical dehiscence of this site in our clinic. She presents today 6 months after discharge from our clinic due to healed right foot wound with now reopening of the previous surgical site. She reports chronic pain to the site however denies purulent drainage, increased warmth or erythema. She noticed the wound opening about 1 month ago. She has been doing physical therapy without significant issues. She has inserts to her shoe however she has been advised to follow-up with [facility] for new custom inserts by her orthopedic surgeon now that she has a new wound. She states she will try and do this. She has been using antibiotic ointment to the wound bed. 8/1; patient  presents for follow-up. She has been taking her antibiotics without issues. She has been using Dakin's wet-to-dry dressings to the wound bed. The wound is smaller. She denies systemic signs of infection. 8/13; patient presents for follow-up. She had ABIs completed that were normal. On the right it was 1.02 and on the left was 0.99. She has been using silver alginate to the wound bed with gentamicin ointment. She states it is hard to pack the wound bed. She currently denies signs of infection. Patient History Information obtained from Patient. Family History Cancer - Paternal Grandparents, Diabetes - Mother, Hypertension - Mother, Stroke - Maternal Grandparents, No family history of Heart Disease, Hereditary Spherocytosis, Kidney Disease, Lung Disease, Seizures, Thyroid Problems, Tuberculosis. Social History Never smoker, Marital Status - Single, Alcohol Use - Rarely, Drug Use - Prior History - Marijuana, Caffeine Use - Daily. ILLONA, GOUGE ([id number]) [id number] Page 8 of 11 Medical History Cardiovascular Patient has history of Deep Vein Thrombosis, Hypertension Endocrine Patient has history of Type II Diabetes - 8.8 HgbA1c 5 months ago Musculoskeletal Patient has history of Osteomyelitis - Right Transmet 12/18/20 Neurologic Patient has history of Neuropathy Hospitalization/Surgery History - inpatient right foot abscess 10/12-10/18/2023. - 2022 right foot transmet. Objective Constitutional respirations regular, non-labored and within target range for patient.. Vitals Time Taken: 1:31 PM, Height: 69 in, Weight: 330 lbs, BMI: 48.7, Temperature: 98.6 F, Pulse: 75 bpm, Respiratory Rate: 18 breaths/min, Blood Pressure: 109/75 mmHg. Cardiovascular 2+ dorsalis pedis/posterior tibialis pulses. Psychiatric pleasant and cooperative. General Notes: T the right foot there is an open wound Along the transmetatarsal previous amputation site with nonviable tissue and  postdebridement o granulation tissue present. Does not  probe to bone. No tenderness on palpation. No purulent drainage or fluctuance noted. No increased warmth or erythema. Integumentary (Hair, Skin) Wound #4 status is Open. Original cause of wound was Shear/Friction. The date acquired was: 07/08/2022. The wound has been in treatment 2 weeks. The wound is located on the Right Amputation Site - Transmetatarsal. The wound measures 1cm length x 0.2cm width x 0.5cm depth; 0.157cm^2 area and 0.079cm^3 volume. There is Fat Layer (Subcutaneous Tissue) exposed. There is no tunneling or undermining noted. There is a medium amount of serosanguineous drainage noted. The wound margin is distinct with the outline attached to the wound base. There is small (1-33%) pink granulation within the wound bed. There is a large (67-100%) amount of necrotic tissue within the wound bed including Eschar and Adherent Slough. The periwound skin appearance exhibited: Callus. The periwound skin appearance did not exhibit: Crepitus, Excoriation, Induration, Rash, Scarring, Dry/Scaly, Maceration, Atrophie Blanche, Cyanosis, Ecchymosis, Hemosiderin Staining, Mottled, Pallor, Rubor, Erythema. The periwound has tenderness on palpation. Assessment Active Problems ICD-10 Non-pressure chronic ulcer of other part of right foot with fat layer exposed Type 2 diabetes mellitus with foot ulcer Other chronic osteomyelitis, right ankle and foot Patient's wound is stable. I debrided nonviable tissue. Since she is having a hard time packing the silver alginate I recommended using collagen but continue the antibiotic ointment. Continue aggressive offloading. Follow-up in 1 week. Procedures Wound #4 Pre-procedure diagnosis of Wound #4 is a Diabetic Wound/Ulcer of the Lower Extremity located on the Right Amputation Site - Transmetatarsal .Severity of Tissue Pre Debridement is: Fat layer exposed. There was a Excisional Skin/Subcutaneous Tissue  Debridement with a total area of 0.16 sq cm performed by Geralyn Corwin, DO. With the following instrument(s): Curette to remove Viable and Non-Viable tissue/material. Material removed includes Callus, Subcutaneous Tissue, and Slough after achieving pain control using Lidocaine 5% topical ointment. No specimens were taken. A time out was conducted at 14:10, prior to the start of the procedure. A Minimum amount of bleeding was controlled with Pressure. The procedure was tolerated well with a pain level of 0 throughout and a pain level of 0 following the procedure. Post Debridement Measurements: 1cm length x 0.2cm width x 0.5cm depth; 0.079cm^3 volume. Character of Wound/Ulcer Post Debridement is improved. Severity of Tissue Post Debridement is: Fat layer exposed. Post procedure Diagnosis Wound #4: Same as Pre-Procedure General Notes: Scribed for Dr. Mikey Bussing by J.Scotton. ENAJAH, FARIN ([id number]) [id number] Page 9 of 11 Plan Follow-up Appointments: Return Appointment in 1 week. - Dr. Mikey Bussing Friday 09/06/22 at 10:15am room 9 Other: - Vascular and vein appointment 08/21/22. T oral antibiotics call [facility] to get shoe adjusted. [phone] [address], [place]- [place] DONE Anesthetic: (In clinic) Topical Lidocaine 4% applied to wound bed Bathing/ Shower/ Hygiene: May shower with protection but do not get wound dressing(s) wet. Protect dressing(s) with water repellant cover (for example, large plastic bag) or a cast cover and may then take shower. Edema Control - Lymphedema / SCD / Other: Elevate legs to the level of the heart or above for 30 minutes daily and/or when sitting for 3-4 times a day throughout the day. Avoid standing for long periods of time. Off-Loading: Open toe surgical shoe to: WOUND #4: - Amputation Site - Transmetatarsal Wound Laterality: Right Topical: Gentamicin 1 x Per Day/30 Days Discharge Instructions: As directed by  physician Topical: Mupirocin Ointment 1 x Per Day/30 Days Discharge Instructions: Apply Mupirocin (Bactroban) as instructed (in clinic) Prim Dressing: PolyMem Non-Adhesive Dressing,  4x4 in 1 x Per Day/30 Days ary Discharge Instructions: Apply to wound bed as instructed Secondary Dressing: ABD Pad, 8x10 1 x Per Day/30 Days Discharge Instructions: Apply over primary dressing as directed. Secured With: [employer], 4.5x3.1 (in/yd) 1 x Per Day/30 Days Discharge Instructions: Secure with Kerlix as directed. Secured With: 56M Medipore H Soft Cloth Surgical T ape, 4 x 10 (in/yd) 1 x Per Day/30 Days Discharge Instructions: Secure with tape as directed. Secured With: ace wrap 1 x Per Day/30 Days Discharge Instructions: apply lightly to hold dressing in place. 1. In office sharp debridement 2. Collagen and antibiotic ointment 3. Aggressive offloading 4. Follow-up in 1 week Electronic Signature(s) Signed: 08/27/2022 4:37:08 PM By: Geralyn Corwin DO Entered By: Geralyn Corwin on 08/27/2022 14:23:53 -------------------------------------------------------------------------------- HxROS Details Patient Name: Date of Service: [name]. 08/27/2022 1:15 PM Medical Record Number: [id number] Patient Account Number: [id number] Date of Birth/Sex: Treating RN: [birth year] (42 y.o. F) Primary Care Provider: Gwinda Passe Other Clinician: Referring Provider: Treating Provider/Extender: Grace Isaac in Treatment: 2 Information Obtained From Patient Cardiovascular Medical History: Positive for: Deep Vein Thrombosis; Hypertension Endocrine Medical History: Positive for: Type II Diabetes - 8.8 HgbA1c 5 months ago Time with diabetes: Dx 2009 Treated with: Insulin, Oral agents Blood sugar tested every day: Yes Tested : daily Musculoskeletal Medical HistoryTRENIDY, DANNA ([id number]) [id number] Page 10 of 11 Positive for:  Osteomyelitis - Right Transmet 12/18/20 Neurologic Medical History: Positive for: Neuropathy Immunizations Pneumococcal Vaccine: Received Pneumococcal Vaccination: No Implantable Devices Yes Hospitalization / Surgery History Type of Hospitalization/Surgery inpatient right foot abscess 10/12-10/18/2023 2022 right foot transmet Family and Social History Cancer: Yes - Paternal Grandparents; Diabetes: Yes - Mother; Heart Disease: No; Hereditary Spherocytosis: No; Hypertension: Yes - Mother; Kidney Disease: No; Lung Disease: No; Seizures: No; Stroke: Yes - Maternal Grandparents; Thyroid Problems: No; Tuberculosis: No; Never smoker; Marital Status - Single; Alcohol Use: Rarely; Drug Use: Prior History - Marijuana; Caffeine Use: Daily; Financial Concerns: No; Food, Clothing or Shelter Needs: No; Support System Lacking: No; Transportation Concerns: No Electronic Signature(s) Signed: 08/27/2022 4:37:08 PM By: Geralyn Corwin DO Entered By: Geralyn Corwin on 08/27/2022 14:22:12 -------------------------------------------------------------------------------- SuperBill Details Patient Name: Date of Service: [name]. 08/27/2022 Medical Record Number: [id number] Patient Account Number: [id number] Date of Birth/Sex: Treating RN: 15-Jan-1980 (42 y.o. F) Primary Care Provider: Gwinda Passe Other Clinician: Referring Provider: Treating Provider/Extender: Grace Isaac in Treatment: 2 Diagnosis Coding ICD-10 Codes Code Description [phone] Non-pressure chronic ulcer of other part of right foot with fat layer exposed E11.621 Type 2 diabetes mellitus with foot ulcer M86.671 Other chronic osteomyelitis, right ankle and foot Facility Procedures : CPT4 Code: 46962952 Description: 11042 - DEB SUBQ TISSUE 20 SQ CM/< ICD-10 Diagnosis Description L97.512 Non-pressure chronic ulcer of other part of right foot with fat layer exposed E11.621 Type 2 diabetes mellitus  with foot ulcer Modifier: Quantity: 1 Physician Procedures Electronic Signature(s) Signed: 08/27/2022 4:37:08 PM By: Geralyn Corwin DO Entered By: Geralyn Corwin on 08/27/2022 14:24:16

## 2022-08-28 ENCOUNTER — Telehealth (INDEPENDENT_AMBULATORY_CARE_PROVIDER_SITE_OTHER): Payer: Self-pay | Admitting: Primary Care

## 2022-08-28 NOTE — Telephone Encounter (Signed)
Spoke to pt. Will be at apt.  

## 2022-08-29 ENCOUNTER — Ambulatory Visit (HOSPITAL_BASED_OUTPATIENT_CLINIC_OR_DEPARTMENT_OTHER): Payer: Medicaid Other | Admitting: Internal Medicine

## 2022-08-29 ENCOUNTER — Ambulatory Visit (INDEPENDENT_AMBULATORY_CARE_PROVIDER_SITE_OTHER): Payer: Medicaid Other | Admitting: Primary Care

## 2022-08-29 DIAGNOSIS — F331 Major depressive disorder, recurrent, moderate: Secondary | ICD-10-CM | POA: Diagnosis not present

## 2022-08-29 NOTE — Progress Notes (Signed)
[facility]  Telephone Note  I connected with [name], on 08/29/2022 at 4:13 PM through telephone and verified that I am speaking with the correct person using two identifiers.   Consent: I discussed the limitations, risks, security and privacy concerns of performing an evaluation and management service by telephone and the availability of in person appointments. I also discussed with the patient that there may be a patient responsible charge related to this service. The patient expressed understanding and agreed to proceed.    Location of Patient: Home   Location of Provider: [facility]   Persons participating in Telemedicine visit: Janine Ores,  NP   History of Present Illness: [name] is a  42 year old female depressed requesting help. 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 She was improving increase mobility feeling better about self now a set back. She denies suicidal ideation or harm to others , she is having difficultly sleeping thoughts about racing why, what happen , what went wrong??? Patient has No headache, No chest pain, No abdominal pain - No Nausea, No new weakness tingling or numbness, No Cough - shortness of breath    Past Medical History:  Diagnosis Date   Class 3 obesity (HCC) 12/16/2020   Diabetes mellitus    No Known Allergies  Current Outpatient Medications on File Prior to Visit  Medication Sig Dispense Refill   amoxicillin (AMOXIL) 500 MG capsule Take 1 capsule (500 mg total) by mouth 3 (three) times daily. 90 capsule 2   amoxicillin-clavulanate (AUGMENTIN) 875-125 MG tablet Take 1 tablet by mouth 2 (two) times daily for 14 days. 28 tablet 0   apixaban (ELIQUIS) 5 MG TABS tablet Take 1 tablet (5mg ) by mouth twice daily. 180 tablet 1   atorvastatin (LIPITOR) 80 MG tablet Take 1  tablet (80 mg total) by mouth once daily. 90 tablet 3   Blood Glucose Monitoring Suppl (TRUE METRIX METER) w/Device KIT Use to check blood sugar twice a day. 1 kit 0   doxycycline (VIBRA-TABS) 100 MG tablet Take 1 tablet (100 mg total) by mouth 2 (two) times daily for 14 day.s 28 tablet 0   Dulaglutide (TRULICITY) 1.5 MG/0.5ML SOPN Inject 1.5 mg into the skin once a week. 6 mL 0   empagliflozin (JARDIANCE) 10 MG TABS tablet Take 1 tablet (10 mg total) by mouth daily. 30 tablet 2   gabapentin (NEURONTIN) 300 MG capsule Take 2 capsules by mouth 3 times daily. 180 capsule 2   gentamicin ointment (GARAMYCIN) 0.1 % Apply 1 Application topically daily. 30 g 0   glucose blood (TRUE METRIX BLOOD GLUCOSE TEST) test strip Use to check blood sugar twice a day. 100 each 2   insulin glargine (LANTUS SOLOSTAR) 100 UNIT/ML Solostar Pen Inject 70 Units into the skin daily. 15 mL 3   Insulin Pen Needle 32G X 4 MM MISC use as directed 100 each 2   lisinopril (ZESTRIL) 2.5 MG tablet Take 1 tablet (2.5 mg total) by mouth daily. 90 tablet 0   metFORMIN (GLUCOPHAGE-XR) 500 MG 24 hr tablet Take 2 tablets (1,000 mg total) by mouth 2 (two) times daily. 120 tablet 2   oxyCODONE (OXY IR/ROXICODONE) 5 MG immediate release tablet Take 5 mg by mouth every 6 (six) hours as needed for moderate pain or severe pain.  sodium hypochlorite (DAKIN'S 1/2 STRENGTH) external solution Use as directed for wet to dry dressings daily to wound. 473 mL 2   TRUEplus Lancets 28G MISC Use to check blood sugar twice a day. 100 each 2   No current facility-administered medications on file prior to visit.    Observations/Objective: There were no vitals taken for this visit.   Assessment and Plan:  Diagnoses and all orders for this visit:  Moderate episode of recurrent major depressive disorder (HCC) Referred to psyche for therapy.     I discussed the assessment and treatment plan with the patient. The patient was provided an  opportunity to ask questions and all were answered. The patient agreed with the plan and demonstrated an understanding of the instructions.   The patient was advised to call back or seek an in-person evaluation if the symptoms worsen or if the condition fails to improve as anticipated.     I provided 20 minutes total of non-face-to-face time during this encounter including median intraservice time, reviewing previous notes, investigations, ordering medications, medical decision making, coordinating care and patient verbalized understanding at the end of the visit.    This note has been created with Education officer, environmental. Any transcriptional errors are unintentional.   Grayce Sessions, NP 08/29/2022, 4:13 PM

## 2022-08-29 NOTE — Telephone Encounter (Signed)
Pt is calling to report phone dc at virtual appt today.

## 2022-08-30 ENCOUNTER — Other Ambulatory Visit (INDEPENDENT_AMBULATORY_CARE_PROVIDER_SITE_OTHER): Payer: Self-pay | Admitting: Primary Care

## 2022-08-30 DIAGNOSIS — F331 Major depressive disorder, recurrent, moderate: Secondary | ICD-10-CM

## 2022-08-30 NOTE — Telephone Encounter (Signed)
Will forward to provider  

## 2022-08-30 NOTE — Telephone Encounter (Signed)
Spoke with patient increased depression referred to therapy.

## 2022-09-02 ENCOUNTER — Ambulatory Visit (HOSPITAL_BASED_OUTPATIENT_CLINIC_OR_DEPARTMENT_OTHER): Payer: Medicaid Other | Admitting: Internal Medicine

## 2022-09-06 ENCOUNTER — Encounter (HOSPITAL_BASED_OUTPATIENT_CLINIC_OR_DEPARTMENT_OTHER): Payer: Medicaid Other | Admitting: Internal Medicine

## 2022-09-12 ENCOUNTER — Encounter (HOSPITAL_BASED_OUTPATIENT_CLINIC_OR_DEPARTMENT_OTHER): Payer: Medicaid Other | Admitting: Internal Medicine

## 2022-09-12 DIAGNOSIS — L97512 Non-pressure chronic ulcer of other part of right foot with fat layer exposed: Secondary | ICD-10-CM | POA: Diagnosis not present

## 2022-09-12 DIAGNOSIS — E11621 Type 2 diabetes mellitus with foot ulcer: Secondary | ICD-10-CM

## 2022-09-19 ENCOUNTER — Encounter (HOSPITAL_BASED_OUTPATIENT_CLINIC_OR_DEPARTMENT_OTHER): Payer: Medicaid Other | Attending: Internal Medicine | Admitting: Internal Medicine

## 2022-09-19 DIAGNOSIS — E1140 Type 2 diabetes mellitus with diabetic neuropathy, unspecified: Secondary | ICD-10-CM | POA: Diagnosis not present

## 2022-09-19 DIAGNOSIS — Z7901 Long term (current) use of anticoagulants: Secondary | ICD-10-CM | POA: Insufficient documentation

## 2022-09-19 DIAGNOSIS — E11621 Type 2 diabetes mellitus with foot ulcer: Secondary | ICD-10-CM | POA: Diagnosis not present

## 2022-09-19 DIAGNOSIS — I10 Essential (primary) hypertension: Secondary | ICD-10-CM | POA: Insufficient documentation

## 2022-09-19 DIAGNOSIS — Z794 Long term (current) use of insulin: Secondary | ICD-10-CM | POA: Insufficient documentation

## 2022-09-19 DIAGNOSIS — Z89431 Acquired absence of right foot: Secondary | ICD-10-CM | POA: Diagnosis not present

## 2022-09-19 DIAGNOSIS — L97512 Non-pressure chronic ulcer of other part of right foot with fat layer exposed: Secondary | ICD-10-CM | POA: Diagnosis not present

## 2022-09-19 DIAGNOSIS — M86671 Other chronic osteomyelitis, right ankle and foot: Secondary | ICD-10-CM | POA: Diagnosis not present

## 2022-09-20 NOTE — Progress Notes (Signed)
BERLIN, JERUE ([id number]) [id number] Page 1 of 11 Visit Report for 09/19/2022 Chief Complaint Document Details Patient Name: Date of Service: [name] IS, [place] [name] 09/19/2022 12:30 PM Medical Record Number: [id number] Patient Account Number: [id number] Date of Birth/Sex: Treating RN: 11-18-1980 (42 y.o. F) Primary Care Provider: Gwinda Passe Other Clinician: Referring Provider: Treating Provider/Extender: Grace Isaac in Treatment: 6 Information Obtained from: Patient Chief Complaint 01/22/2021; Osteomyelitis of the right foot status post transmetatarsal amputation with surgical site dehiscence 08/08/2022; reopening of wound to the right foot with a history of transmetatarsal amputation with previous difficulty healing surgical site Electronic Signature(s) Signed: 09/19/2022 5:29:38 PM By: Geralyn Corwin DO Entered By: Geralyn Corwin on 09/19/2022 10:38:24 -------------------------------------------------------------------------------- Debridement Details Patient Name: Date of Service: [name], [name] 09/19/2022 12:30 PM Medical Record Number: [id number] Patient Account Number: [id number] Date of Birth/Sex: Treating RN: 1980/04/03 (42 y.o. [name] Primary Care Provider: Gwinda Passe Other Clinician: Referring Provider: Treating Provider/Extender: Grace Isaac in Treatment: 6 Debridement Performed for Assessment: Wound #4 Right Amputation Site - Transmetatarsal Performed By: Physician Geralyn Corwin, DO Debridement Type: Debridement Severity of Tissue Pre Debridement: Fat layer exposed Level of Consciousness (Pre-procedure): Awake and Alert Pre-procedure Verification/Time Out Yes - 13:20 Taken: Start Time: 13:20 Pain Control: Lidocaine 4% T opical Solution Percent of Wound Bed Debrided: 100% T Area Debrided (cm): otal 0.75 Tissue and other material debrided: Viable,  Non-Viable, Callus, Slough, Subcutaneous, Slough Level: Skin/Subcutaneous Tissue Debridement Description: Excisional Instrument: Curette Bleeding: Minimum Hemostasis Achieved: Pressure End Time: 13:22 Procedural Pain: 0 Post Procedural Pain: 0 Response to Treatment: Procedure was tolerated well Level of Consciousness (Post- Awake and Alert procedure): Post Debridement Measurements of Total Wound Length: (cm) 0.5 Width: (cm) 1.9 Depth: (cm) 0.5 Volume: (cm) 0.373 Schlueter, Kischa L ([id number]) [id number] Page 2 of 11 Character of Wound/Ulcer Post Debridement: Improved Severity of Tissue Post Debridement: Fat layer exposed Post Procedure Diagnosis Same as Pre-procedure Notes Scribed for Dr. Mikey Bussing by J.Scotton Electronic Signature(s) Signed: 09/19/2022 3:50:25 PM By: Karie Schwalbe RN Signed: 09/19/2022 5:29:38 PM By: Geralyn Corwin DO Entered By: Karie Schwalbe on 09/19/2022 10:28:25 -------------------------------------------------------------------------------- HPI Details Patient Name: Date of Service: [name], [name] 09/19/2022 12:30 PM Medical Record Number: [id number] Patient Account Number: [id number] Date of Birth/Sex: Treating RN: 01-07-1981 (42 y.o. F) Primary Care Provider: Gwinda Passe Other Clinician: Referring Provider: Treating Provider/Extender: Grace Isaac in Treatment: 6 History of Present Illness HPI Description: Admission 01/22/2021 [name] is a 42 year old female with a past medical history of insulin-dependent uncontrolled type 2 diabetes with last hemoglobin A1c of 13.5, osteomyelitis of the right foot status post transmetatarsal amputation on 12/18/2020 that presents to the clinic for right foot wound. She has had dehiscence of the surgical site. She is currently using wet-to-dry dressings. She has a PICC line and receiving IV ceftriaxone daily for her osteomyelitis. There is an end  date of 01/27/2021. She is also taking oral metronidazole. She currently denies systemic signs of infection. 1/19; patient presents for follow-up. She was diagnosed with a DVT to the right lower extremity 2 days ago. She is on Eliquis now. She is scheduled to see her infectious disease doctor tomorrow. She has been using Dakin's wet-to-dry dressings. She denies systemic signs of infection. 1/26; patient presents for follow-up. She saw infectious disease on 1/21 started on Augmentin. Her PICC line and IV ceftriaxone was discontinued. Patient reports stability to her wound.  She has been using Dakin's wet-to-dry dressings. She currently denies systemic signs of infection. 2/3; patient presents for follow-up. She continues to use Dakin's wet-to-dry dressings to the wound bed. She saw Dr. Manson Passey with infectious disease yesterday and is continuing Augmentin. T entative end date is 2/16. Patient reports following up with orthopedics. She states there is no further plan from them. She currently denies systemic signs of infection. 2/10; patient presents for follow-up. She continues to use Dakin's wet to dry dressings. She is scheduled to have her MRI done on 2/14. She states that she had pain to the debridement site from last clinic visit and declines debridement today. She denies systemic signs of infection. She continues to have yellow thick drainage. 2/20; patient presents for follow-up. She continues to use Dakin's wet-to-dry dressings. She obtained her MRI. The results showed an abscess and she is scheduled to see her orthopedic surgeon on 2/23. She saw infectious disease 2/17 and her antibiotics were extended. She currently denies systemic signs of infection. 3/6; patient presents for follow-up. She had debridement and irrigation of her foot on 03/10/2021 due to abscess noted on MRI. She was started on IV ceftriaxone and oral Flagyl. She has no issues or complaints today. She has been using iodoform  packing to the tunnel and Dakin's wet-to-dry to the opening. 03/26/2021: She continues on IV ceftriaxone and oral metronidazole. She has follow-up with infectious disease tomorrow. No significant issues or complaints today. Her mother continues to help her with her wound dressing, using iodoform packing strips into the tunnel and Dakin's to the open portion of the wound. 3/20; patient presents for follow-up. She continues to be on IV ceftriaxone in oral metronidazole. She has been using iodoform to the tunnel and Dakin's wet-to- dry to the open wound. She denies signs of infection. 3/27; patient presents for follow-up. She no longer has a PICC line. She has been using iodoform to the tunnel and Dakin's wet-to-dry to the open wound. She reports improvement in wound healing. She denies signs of infection. 4/3; patient presents for follow-up. She states she has been using Hydrofera Blue to the open wound and iodoform packing to the tunnel without any issues. She denies signs of infection. 4/18; patient presents for follow-up. She saw infectious disease on 4/11. She has finished her oral antibiotics and completed a total of 6 weeks of antibiotics (this includes IV as well). No further antibiotics needed. She has been using Hydrofera Blue and iodoform packing. She states that the tunneled area has come in and the iodoform is not staying in place anymore. She has no issues or complaints today. She denies signs of infection. 4/24; patient presents for follow-up. She saw Dr. Carlene Coria, plastic surgery to discuss potential skin graft/substitute placement. At this time he thinks that the skin graft would likely not take. He is in agreement with trying a wound VAC. Patient has been using Hydrofera Blue dressing changes with no issues. She denies signs of infection. She reports improvement in wound healing. 5/1; patient presents for follow-up. Unfortunately patient did not have insurance when we ran for the pico.  There is an assistance program and we are trying to get this accommodated for the patient. In the meantime she has been using Hydrofera Blue without any issues. She denies signs of infection. SHINIQUA, BELVEAL ([id number]) [id number] Page 3 of 11 5/8; patient presents for follow-up. We have not heard back if pico is covered by her insurance. She has been using collagen to the wound  bed over the past week. She denies signs of infection. 5/18; patient presents for follow-up. She has been using collagen to the wound bed without issues. Again we have not heard if pico is covered by her insurance. She has no issues or complaints today. 5/23; patient presents for follow-up. She has been using collagen to the wound bed. She has no issues or complaints today. She obtained the wound VAC from [facility] and brought this in today. She denies signs of infection. 6/1; patient presents for follow-up. She has been using the wound VAC for the past week. She has had this changed twice since she was last here. She reports more maceration to the periwound. She denies signs of infection. 6/7; right TMA site. There are 2 wounds 0 separated by a bridge of healed tissue. The more lateral area has undermining. Both areas have healthy looking granulation at the base but relative the size of the wound is fairly deep. There is no exposed bone no evidence of infection. Her wound VAC was put on hold last week because of surrounding skin maceration she has been using collagen this week. She has a modified shoe 6/12; patient presents for follow-up. Last week the wound VAC was reinitiated. She had no issues with the wound VAC itself. Today she has maceration again noted to the surrounding skin. She denies signs of infection. 6/27; patient presents for follow-up. She has been using Medihoney to the wound bed. We took a break from the wound VAC because the periwound was macerated. She still has some areas of  maceration to the distal foot where there is a callus. She currently denies signs of infection. 7/11; patient presents for follow-up. She has been using Medihoney to the wound bed. She has developed some increased warmth and erythema to the lateral aspect of the right foot. She states this is occurred over the past week and there is increased pain. No drainage noted. 7/17; patient presents for follow-up. She has been using Medihoney to the wound bed. She completed her course of Bactrim. She reports improvement in symptoms. 7/24; Patient presents for follow up. She has been using Medihoney to the wound bed without issues. She completed another course of Bactrim. She reports improvement in her symptoms but still has some mild tenderness to the medial aspect of the foot. 7/31; Patient presents for follow-up. She has been using Medihoney and Dakin's to the wound bed. She denies signs of infection. 8/14; patient presents for follow-up. She has been using Dakin's wet-to-dry packing strips to the right medial aspect of the amputation site and Medihoney to the anterior site. She has no issues or complaints today. She has started physical therapy. She denies signs of infection. 8/29; patient presents for follow-up. She has been using Dakin's wet-to-dry packing strips to the right medial aspect of the amputation site however this is becoming more difficult to place. She did report that she had increased redness and swelling to that site and developed some drainage. It has resolved. She continues with physical therapy. 9/8; patient presents for follow-up. We have been using silver alginate to the tunneled area. She completed her course of antibiotics. She reports no pain, increased swelling or erythema. 9/21; patient presents for follow-up. She has been using gentamicin to the tunneled area. She has no issues or complaints today. She denies signs of infection. 10/2; patient presents for follow-up. She is  scheduled to have her CT scan on 10/9. She currently denies signs of infection. She denies increased warmth,  erythema or purulent drainage from the wound bed. She has been using Dakin's wet-to-dry dressings. 10/12; patient presents for follow-up. She had her CT scan on 10/9 that showed A small irregular rim-enhancing fluid pocket communicating to the overlying soft tissues of the sinus tract compatible with a small abscess. Currently she denies systemic signs of infection. She has been doing Dakin's wet-to-dry packing strips but it is hard for her to pack into the narrow opening. 10/30; patient presents for follow-up. Since last clinic visit she has had OR debridement of her Right foot by Dr. Odis Hollingshead Due to abscess noted on CT. She has been using iodoform packing. 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 She will complete 2 weeks of this and continue with oral amoxicillin for the next 6 to 12 months. She follows with Dr. Luciana Axe for this. She currently denies signs of infection. 11/13; patient presents for follow-up. Patient has been using silver alginate with gentamicin to the wound bed. She has no issues or complaints today she. She reports improvement in wound healing. 12/4; patient presents for follow-up. She has been using silver alginate and gentamicin to the wound bed. She has no issues or complaints today. 12/8; patient presents for follow-up. She has been using silver alginate with gentamicin to the wound bed. She presents for her first cast placement. She will be back early next week for her obligatory cast change. 12/12; patient presents for follow-up. We have been using collagen with antibiotic ointment to the wound bed under a total contact cast. She has tolerated this well. She is improved in wound healing. 12/18; patient presents for follow-up. We have been using collagen with antibiotic ointment under the total contact cast. She has no  complaints today. 1/2; The cast was placed at last clinic visit however patient missed her follow-up and this was taken off last week at a nurse visit. Patient has been using packing strips to the medial narrowed wound. She has noted no drainage. 1/22; patient presents for follow-up. She has been keeping the area covered. She reports no drainage. She has had no issues to the previous wound site. She denies any signs of infection including increased warmth, erythema or purulent drainage. 08/08/2022 [name] is a 42 year old female with a past medical history of insulin-dependent uncontrolled type 2 diabetes with last hemoglobin A1c of 8.8, chronic osteomyelitis of the right foot status post transmetatarsal amputation on 12/18/2020 that has been previously treated for surgical dehiscence of this site in our clinic. She presents today 6 months after discharge from our clinic due to healed right foot wound with now reopening of the previous surgical site. She reports chronic pain to the site however denies purulent drainage, increased warmth or erythema. She noticed the wound opening about 1 month ago. She has been doing physical therapy without significant issues. She has inserts to her shoe however she has been advised to follow-up with [facility] for new custom inserts by her orthopedic surgeon now that she has a new wound. She states she will try and do this. She has been using antibiotic ointment to the wound bed. 8/1; patient presents for follow-up. She has been taking her antibiotics without issues. She has been using Dakin's wet-to-dry dressings to the wound bed. The wound is smaller. She denies systemic signs of infection. 8/13; patient presents for follow-up. She had ABIs completed that were normal. On the right it was 1.02 and on the left was 0.99. She has been  using silver alginate to the wound bed with gentamicin ointment. She states it is hard to pack the wound bed. She currently denies  signs of infection. TACIA, VARGASON ([id number]) [id number] Page 4 of 11 8/29; patient presents for follow-up. She has been using collagen with antibiotic ointment to the wound bed. Wound is stable. She denies signs of infection. 9/5; patient presents for follow-up. She has been using silver alginate to the wound bed. Wound is slightly wider. She denies signs of infection. Electronic Signature(s) Signed: 09/19/2022 5:29:38 PM By: Geralyn Corwin DO Entered By: Geralyn Corwin on 09/19/2022 10:38:56 -------------------------------------------------------------------------------- Physical Exam Details Patient Name: Date of Service: [name], [name] 09/19/2022 12:30 PM Medical Record Number: [id number] Patient Account Number: [id number] Date of Birth/Sex: Treating RN: 19-Nov-1980 (42 y.o. F) Primary Care Provider: Gwinda Passe Other Clinician: Referring Provider: Treating Provider/Extender: Grace Isaac in Treatment: 6 Constitutional respirations regular, non-labored and within target range for patient.. Cardiovascular 2+ dorsalis pedis/posterior tibialis pulses. Psychiatric pleasant and cooperative. Notes T the right foot there is an open wound Along the transmetatarsal previous amputation site with nonviable tissue and postdebridement granulation tissue o present. Does not probe to bone. No purulent drainage or fluctuance noted. No increased warmth or erythema. Maceration to the periwound. Electronic Signature(s) Signed: 09/19/2022 5:29:38 PM By: Geralyn Corwin DO Entered By: Geralyn Corwin on 09/19/2022 10:39:52 -------------------------------------------------------------------------------- Physician Orders Details Patient Name: Date of Service: [name], [name] 09/19/2022 12:30 PM Medical Record Number: [id number] Patient Account Number: [id number] Date of Birth/Sex: Treating RN: 1980/03/25 (42 y.o. [name] Primary Care Provider: Gwinda Passe Other Clinician: Referring Provider: Treating Provider/Extender: Grace Isaac in Treatment: 6 Verbal / Phone Orders: No Diagnosis Coding Follow-up Appointments ppointment in 1 week. - Dr. Mikey Bussing 09/26/22 at 12:30pm Return A room 9 Other: - -Already DONE Vascular and vein appointment 08/21/22. T oral antibiotics ake call [facility] to get shoe adjusted. [phone] [place]-DONE Anesthetic (In clinic) Topical Lidocaine 4% applied to wound bed Cellular or Tissue Based Products Wound #4 Right Amputation Site - Transmetatarsal Cellular or Tissue Based Product Type: - RUN IVR for APLIGRAF (09/12/22)-SENT NOTES Schader, Sorah L ([id number]) [id number] Page 5 of 11 Bathing/ Shower/ Hygiene May shower with protection but do not get wound dressing(s) wet. Protect dressing(s) with water repellant cover (for example, large plastic bag) or a cast cover and may then take shower. Edema Control - Lymphedema / SCD / Other Elevate legs to the level of the heart or above for 30 minutes daily and/or when sitting for 3-4 times a day throughout the day. Avoid standing for long periods of time. Off-Loading Open toe surgical shoe to: Wound Treatment Wound #4 - Amputation Site - Transmetatarsal Wound Laterality: Right Prim Dressing: Maxorb Extra Ag+ Alginate Dressing, 2x2 (in/in) 1 x Per Day/30 Days ary Discharge Instructions: Apply to wound bed as instructed Secondary Dressing: ABD Pad, 8x10 1 x Per Day/30 Days Discharge Instructions: Apply over primary dressing as directed. Secured With: [employer], 4.5x3.1 (in/yd) 1 x Per Day/30 Days Discharge Instructions: Secure with Kerlix as directed. Secured With: 52M Medipore H Soft Cloth Surgical T ape, 4 x 10 (in/yd) 1 x Per Day/30 Days Discharge Instructions: Secure with tape as directed. Secured With: ace wrap 1 x Per  Day/30 Days Discharge Instructions: apply lightly to hold dressing in place. Electronic Signature(s) Signed: 09/19/2022 5:29:38 PM By: Geralyn Corwin DO Entered By: Geralyn Corwin on  09/19/2022 10:40:02 -------------------------------------------------------------------------------- Problem List Details Patient Name: Date of Service: [name] IS, [place] [name] 09/19/2022 12:30 PM Medical Record Number: [id number] Patient Account Number: [id number] Date of Birth/Sex: Treating RN: 1980-03-14 (42 y.o. F) Primary Care Provider: Gwinda Passe Other Clinician: Referring Provider: Treating Provider/Extender: Grace Isaac in Treatment: 6 Active Problems ICD-10 Encounter Code Description Active Date MDM Diagnosis L97.512 Non-pressure chronic ulcer of other part of right foot with fat layer exposed 08/08/2022 No Yes E11.621 Type 2 diabetes mellitus with foot ulcer 08/08/2022 No Yes M86.671 Other chronic osteomyelitis, right ankle and foot 08/08/2022 No Yes Inactive Problems Resolved Problems Coco, Inetta L ([id number]) [phone].pdf Page 6 of 11 Electronic Signature(s) Signed: 09/19/2022 5:29:38 PM By: Geralyn Corwin DO Entered By: Geralyn Corwin on 09/19/2022 10:38:01 -------------------------------------------------------------------------------- Progress Note Details Patient Name: Date of Service: [name], [name] 09/19/2022 12:30 PM Medical Record Number: [id number] Patient Account Number: [id number] Date of Birth/Sex: Treating RN: 1980/08/08 (42 y.o. F) Primary Care Provider: Gwinda Passe Other Clinician: Referring Provider: Treating Provider/Extender: Grace Isaac in Treatment: 6 Subjective Chief Complaint Information obtained from Patient 01/22/2021; Osteomyelitis of the right foot status post transmetatarsal amputation with surgical site dehiscence 08/08/2022; reopening of wound to the right  foot with a history of transmetatarsal amputation with previous difficulty healing surgical site History of Present Illness (HPI) Admission 01/22/2021 [name] is a 42 year old female with a past medical history of insulin-dependent uncontrolled type 2 diabetes with last hemoglobin A1c of 13.5, osteomyelitis of the right foot status post transmetatarsal amputation on 12/18/2020 that presents to the clinic for right foot wound. She has had dehiscence of the surgical site. She is currently using wet-to-dry dressings. She has a PICC line and receiving IV ceftriaxone daily for her osteomyelitis. There is an end date of 01/27/2021. She is also taking oral metronidazole. She currently denies systemic signs of infection. 1/19; patient presents for follow-up. She was diagnosed with a DVT to the right lower extremity 2 days ago. She is on Eliquis now. She is scheduled to see her infectious disease doctor tomorrow. She has been using Dakin's wet-to-dry dressings. She denies systemic signs of infection. 1/26; patient presents for follow-up. She saw infectious disease on 1/21 started on Augmentin. Her PICC line and IV ceftriaxone was discontinued. Patient reports stability to her wound. She has been using Dakin's wet-to-dry dressings. She currently denies systemic signs of infection. 2/3; patient presents for follow-up. She continues to use Dakin's wet-to-dry dressings to the wound bed. She saw Dr. Manson Passey with infectious disease yesterday and is continuing Augmentin. T entative end date is 2/16. Patient reports following up with orthopedics. She states there is no further plan from them. She currently denies systemic signs of infection. 2/10; patient presents for follow-up. She continues to use Dakin's wet to dry dressings. She is scheduled to have her MRI done on 2/14. She states that she had pain to the debridement site from last clinic visit and declines debridement today. She denies systemic signs of  infection. She continues to have yellow thick drainage. 2/20; patient presents for follow-up. She continues to use Dakin's wet-to-dry dressings. She obtained her MRI. The results showed an abscess and she is scheduled to see her orthopedic surgeon on 2/23. She saw infectious disease 2/17 and her antibiotics were extended. She currently denies systemic signs of infection. 3/6; patient presents for follow-up. She had debridement and irrigation of her foot on 03/10/2021 due to abscess noted on MRI.  She was started on IV ceftriaxone and oral Flagyl. She has no issues or complaints today. She has been using iodoform packing to the tunnel and Dakin's wet-to-dry to the opening. 03/26/2021: She continues on IV ceftriaxone and oral metronidazole. She has follow-up with infectious disease tomorrow. No significant issues or complaints today. Her mother continues to help her with her wound dressing, using iodoform packing strips into the tunnel and Dakin's to the open portion of the wound. 3/20; patient presents for follow-up. She continues to be on IV ceftriaxone in oral metronidazole. She has been using iodoform to the tunnel and Dakin's wet-to- dry to the open wound. She denies signs of infection. 3/27; patient presents for follow-up. She no longer has a PICC line. She has been using iodoform to the tunnel and Dakin's wet-to-dry to the open wound. She reports improvement in wound healing. She denies signs of infection. 4/3; patient presents for follow-up. She states she has been using Hydrofera Blue to the open wound and iodoform packing to the tunnel without any issues. She denies signs of infection. 4/18; patient presents for follow-up. She saw infectious disease on 4/11. She has finished her oral antibiotics and completed a total of 6 weeks of antibiotics (this includes IV as well). No further antibiotics needed. She has been using Hydrofera Blue and iodoform packing. She states that the tunneled area has  come in and the iodoform is not staying in place anymore. She has no issues or complaints today. She denies signs of infection. 4/24; patient presents for follow-up. She saw Dr. Carlene Coria, plastic surgery to discuss potential skin graft/substitute placement. At this time he thinks that the skin graft would likely not take. He is in agreement with trying a wound VAC. Patient has been using Hydrofera Blue dressing changes with no issues. She denies signs of infection. She reports improvement in wound healing. 5/1; patient presents for follow-up. Unfortunately patient did not have insurance when we ran for the pico. There is an assistance program and we are trying to get this accommodated for the patient. In the meantime she has been using Hydrofera Blue without any issues. She denies signs of infection. 5/8; patient presents for follow-up. We have not heard back if pico is covered by her insurance. She has been using collagen to the wound bed over the past week. She denies signs of infection. 5/18; patient presents for follow-up. She has been using collagen to the wound bed without issues. Again we have not heard if pico is covered by her insurance. She has no issues or complaints today. BREXLEY, PEROVICH ([id number]) [id number] Page 7 of 11 5/23; patient presents for follow-up. She has been using collagen to the wound bed. She has no issues or complaints today. She obtained the wound VAC from [facility] and brought this in today. She denies signs of infection. 6/1; patient presents for follow-up. She has been using the wound VAC for the past week. She has had this changed twice since she was last here. She reports more maceration to the periwound. She denies signs of infection. 6/7; right TMA site. There are 2 wounds 0 separated by a bridge of healed tissue. The more lateral area has undermining. Both areas have healthy looking granulation at the base but relative the size of the  wound is fairly deep. There is no exposed bone no evidence of infection. Her wound VAC was put on hold last week because of surrounding skin maceration she has been using collagen this week. She has  a modified shoe 6/12; patient presents for follow-up. Last week the wound VAC was reinitiated. She had no issues with the wound VAC itself. Today she has maceration again noted to the surrounding skin. She denies signs of infection. 6/27; patient presents for follow-up. She has been using Medihoney to the wound bed. We took a break from the wound VAC because the periwound was macerated. She still has some areas of maceration to the distal foot where there is a callus. She currently denies signs of infection. 7/11; patient presents for follow-up. She has been using Medihoney to the wound bed. She has developed some increased warmth and erythema to the lateral aspect of the right foot. She states this is occurred over the past week and there is increased pain. No drainage noted. 7/17; patient presents for follow-up. She has been using Medihoney to the wound bed. She completed her course of Bactrim. She reports improvement in symptoms. 7/24; Patient presents for follow up. She has been using Medihoney to the wound bed without issues. She completed another course of Bactrim. She reports improvement in her symptoms but still has some mild tenderness to the medial aspect of the foot. 7/31; Patient presents for follow-up. She has been using Medihoney and Dakin's to the wound bed. She denies signs of infection. 8/14; patient presents for follow-up. She has been using Dakin's wet-to-dry packing strips to the right medial aspect of the amputation site and Medihoney to the anterior site. She has no issues or complaints today. She has started physical therapy. She denies signs of infection. 8/29; patient presents for follow-up. She has been using Dakin's wet-to-dry packing strips to the right medial aspect of the  amputation site however this is becoming more difficult to place. She did report that she had increased redness and swelling to that site and developed some drainage. It has resolved. She continues with physical therapy. 9/8; patient presents for follow-up. We have been using silver alginate to the tunneled area. She completed her course of antibiotics. She reports no pain, increased swelling or erythema. 9/21; patient presents for follow-up. She has been using gentamicin to the tunneled area. She has no issues or complaints today. She denies signs of infection. 10/2; patient presents for follow-up. She is scheduled to have her CT scan on 10/9. She currently denies signs of infection. She denies increased warmth, erythema or purulent drainage from the wound bed. She has been using Dakin's wet-to-dry dressings. 10/12; patient presents for follow-up. She had her CT scan on 10/9 that showed A small irregular rim-enhancing fluid pocket communicating to the overlying soft tissues of the sinus tract compatible with a small abscess. Currently she denies systemic signs of infection. She has been doing Dakin's wet-to-dry packing strips but it is hard for her to pack into the narrow opening. 10/30; patient presents for follow-up. Since last clinic visit she has had OR debridement of her Right foot by Dr. Odis Hollingshead Due to abscess noted on CT. She has been using iodoform packing. 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 She will complete 2 weeks of this and continue with oral amoxicillin for the next 6 to 12 months. She follows with Dr. Luciana Axe for this. She currently denies signs of infection. 11/13; patient presents for follow-up. Patient has been using silver alginate with gentamicin to the wound bed. She has no issues or complaints today she. She reports improvement in wound healing. 12/4; patient presents for follow-up. She has been using silver  alginate and gentamicin  to the wound bed. She has no issues or complaints today. 12/8; patient presents for follow-up. She has been using silver alginate with gentamicin to the wound bed. She presents for her first cast placement. She will be back early next week for her obligatory cast change. 12/12; patient presents for follow-up. We have been using collagen with antibiotic ointment to the wound bed under a total contact cast. She has tolerated this well. She is improved in wound healing. 12/18; patient presents for follow-up. We have been using collagen with antibiotic ointment under the total contact cast. She has no complaints today. 1/2; The cast was placed at last clinic visit however patient missed her follow-up and this was taken off last week at a nurse visit. Patient has been using packing strips to the medial narrowed wound. She has noted no drainage. 1/22; patient presents for follow-up. She has been keeping the area covered. She reports no drainage. She has had no issues to the previous wound site. She denies any signs of infection including increased warmth, erythema or purulent drainage. 08/08/2022 Ms. Krystin Rudden is a 42 year old female with a past medical history of insulin-dependent uncontrolled type 2 diabetes with last hemoglobin A1c of 8.8, chronic osteomyelitis of the right foot status post transmetatarsal amputation on 12/18/2020 that has been previously treated for surgical dehiscence of this site in our clinic. She presents today 6 months after discharge from our clinic due to healed right foot wound with now reopening of the previous surgical site. She reports chronic pain to the site however denies purulent drainage, increased warmth or erythema. She noticed the wound opening about 1 month ago. She has been doing physical therapy without significant issues. She has inserts to her shoe however she has been advised to follow-up with [facility] for new custom inserts by her orthopedic surgeon now that  she has a new wound. She states she will try and do this. She has been using antibiotic ointment to the wound bed. 8/1; patient presents for follow-up. She has been taking her antibiotics without issues. She has been using Dakin's wet-to-dry dressings to the wound bed. The wound is smaller. She denies systemic signs of infection. 8/13; patient presents for follow-up. She had ABIs completed that were normal. On the right it was 1.02 and on the left was 0.99. She has been using silver alginate to the wound bed with gentamicin ointment. She states it is hard to pack the wound bed. She currently denies signs of infection. 8/29; patient presents for follow-up. She has been using collagen with antibiotic ointment to the wound bed. Wound is stable. She denies signs of infection. 9/5; patient presents for follow-up. She has been using silver alginate to the wound bed. Wound is slightly wider. She denies signs of infection. Patient History Information obtained from Patient. [name], [name] ([id number]) [id number] Page 8 of 11 Family History Cancer - Paternal Grandparents, Diabetes - Mother, Hypertension - Mother, Stroke - Maternal Grandparents, No family history of Heart Disease, Hereditary Spherocytosis, Kidney Disease, Lung Disease, Seizures, Thyroid Problems, Tuberculosis. Social History Never smoker, Marital Status - Single, Alcohol Use - Rarely, Drug Use - Prior History - Marijuana, Caffeine Use - Daily. Medical History Cardiovascular Patient has history of Deep Vein Thrombosis, Hypertension Endocrine Patient has history of Type II Diabetes - 8.8 HgbA1c 5 months ago Musculoskeletal Patient has history of Osteomyelitis - Right Transmet 12/18/20 Neurologic Patient has history of Neuropathy Hospitalization/Surgery History - inpatient right foot abscess 10/12-10/18/2023. -  2022 right foot transmet. Objective Constitutional respirations regular, non-labored and within  target range for patient.. Vitals Time Taken: 1:08 PM, Height: 69 in, Weight: 330 lbs, BMI: 48.7, Temperature: 98.1 F, Pulse: 75 bpm, Respiratory Rate: 18 breaths/min, Blood Pressure: 112/78 mmHg. Cardiovascular 2+ dorsalis pedis/posterior tibialis pulses. Psychiatric pleasant and cooperative. General Notes: T the right foot there is an open wound Along the transmetatarsal previous amputation site with nonviable tissue and postdebridement o granulation tissue present. Does not probe to bone. No purulent drainage or fluctuance noted. No increased warmth or erythema. Maceration to the periwound. Integumentary (Hair, Skin) Wound #4 status is Open. Original cause of wound was Shear/Friction. The date acquired was: 07/08/2022. The wound has been in treatment 6 weeks. The wound is located on the Right Amputation Site - Transmetatarsal. The wound measures 0.5cm length x 1.9cm width x 0.5cm depth; 0.746cm^2 area and 0.373cm^3 volume. There is Fat Layer (Subcutaneous Tissue) exposed. There is no tunneling or undermining noted. There is a medium amount of serosanguineous drainage noted. The wound margin is distinct with the outline attached to the wound base. There is small (1-33%) pink granulation within the wound bed. There is a large (67-100%) amount of necrotic tissue within the wound bed including Eschar and Adherent Slough. The periwound skin appearance exhibited: Callus, Maceration. The periwound skin appearance did not exhibit: Crepitus, Excoriation, Induration, Rash, Scarring, Dry/Scaly, Atrophie Blanche, Cyanosis, Ecchymosis, Hemosiderin Staining, Mottled, Pallor, Rubor, Erythema. The periwound has tenderness on palpation. Assessment Active Problems ICD-10 Non-pressure chronic ulcer of other part of right foot with fat layer exposed Type 2 diabetes mellitus with foot ulcer Other chronic osteomyelitis, right ankle and foot Patient's wound is slightly wider today. There is more maceration. No  signs of infection on exam. I debrided non viable tissue. I recommended stopping the gentamicin ointment and just doing silver alginate. I recommended she make sure to keep the area clean and dry. I also recommended an Ace wrap to help the wound from splitting along the incision line. Continue aggressive offloading. Follow-up in 1 week. She may benefit from a total contact cast if wound does not show improvement. Procedures Wound #4 Pre-procedure diagnosis of Wound #4 is a Diabetic Wound/Ulcer of the Lower Extremity located on the Right Amputation Site - Transmetatarsal .Severity of Tissue Pre Debridement is: Fat layer exposed. There was a Excisional Skin/Subcutaneous Tissue Debridement with a total area of 0.75 sq cm performed by Geralyn Corwin, DO. With the following instrument(s): Curette to remove Viable and Non-Viable tissue/material. Material removed includes Callus, Subcutaneous Tissue, and Slough after achieving pain control using Lidocaine 4% Topical Solution. No specimens were taken. A time out was conducted at BILLEE, MACKER ([id number]) [phone].pdf Page 9 of 11 13:20, prior to the start of the procedure. A Minimum amount of bleeding was controlled with Pressure. The procedure was tolerated well with a pain level of 0 throughout and a pain level of 0 following the procedure. Post Debridement Measurements: 0.5cm length x 1.9cm width x 0.5cm depth; 0.373cm^3 volume. Character of Wound/Ulcer Post Debridement is improved. Severity of Tissue Post Debridement is: Fat layer exposed. Post procedure Diagnosis Wound #4: Same as Pre-Procedure General Notes: Scribed for Dr. Mikey Bussing by J.Scotton. Plan Follow-up Appointments: Return Appointment in 1 week. - Dr. Mikey Bussing 09/26/22 at 12:30pm room 9 Other: - -Already DONE Vascular and vein appointment 08/21/22. T oral antibiotics call [facility] to get shoe adjusted. [phone] [address] [facility],  [place]-DONE Anesthetic: (In clinic) Topical Lidocaine 4% applied to  wound bed Cellular or Tissue Based Products: Wound #4 Right Amputation Site - Transmetatarsal: Cellular or Tissue Based Product Type: - RUN IVR for APLIGRAF (09/12/22)-SENT NOTES Bathing/ Shower/ Hygiene: May shower with protection but do not get wound dressing(s) wet. Protect dressing(s) with water repellant cover (for example, large plastic bag) or a cast cover and may then take shower. Edema Control - Lymphedema / SCD / Other: Elevate legs to the level of the heart or above for 30 minutes daily and/or when sitting for 3-4 times a day throughout the day. Avoid standing for long periods of time. Off-Loading: Open toe surgical shoe to: WOUND #4: - Amputation Site - Transmetatarsal Wound Laterality: Right Prim Dressing: Maxorb Extra Ag+ Alginate Dressing, 2x2 (in/in) 1 x Per Day/30 Days ary Discharge Instructions: Apply to wound bed as instructed Secondary Dressing: ABD Pad, 8x10 1 x Per Day/30 Days Discharge Instructions: Apply over primary dressing as directed. Secured With: [employer], 4.5x3.1 (in/yd) 1 x Per Day/30 Days Discharge Instructions: Secure with Kerlix as directed. Secured With: 1M Medipore H Soft Cloth Surgical T ape, 4 x 10 (in/yd) 1 x Per Day/30 Days Discharge Instructions: Secure with tape as directed. Secured With: ace wrap 1 x Per Day/30 Days Discharge Instructions: apply lightly to hold dressing in place. 1. In office sharp debridement 2. Silver alginate 3. Follow-up in 1 week 4. Aggressive offloadingsurgical shoe Electronic Signature(s) Signed: 09/19/2022 5:29:38 PM By: Geralyn Corwin DO Entered By: Geralyn Corwin on 09/19/2022 10:42:17 -------------------------------------------------------------------------------- HxROS Details Patient Name: Date of Service: [name], [name] 09/19/2022 12:30 PM Medical Record Number: [id number] Patient Account Number: [id number] Date of  Birth/Sex: Treating RN: 1980-08-26 (42 y.o. F) Primary Care Provider: Gwinda Passe Other Clinician: Referring Provider: Treating Provider/Extender: Grace Isaac in Treatment: 6 Information Obtained From Patient Cardiovascular Medical History: Positive for: Deep Vein Thrombosis; Hypertension Endocrine Medical History: Positive for: Type II Diabetes - 8.8 HgbA1c 5 months ago AYNARA, URUETA ([id number]) [phone].pdf Page 10 of 11 Time with diabetes: Dx 2009 Treated with: Insulin, Oral agents Blood sugar tested every day: Yes Tested : daily Musculoskeletal Medical History: Positive for: Osteomyelitis - Right Transmet 12/18/20 Neurologic Medical History: Positive for: Neuropathy Immunizations Pneumococcal Vaccine: Received Pneumococcal Vaccination: No Implantable Devices Yes Hospitalization / Surgery History Type of Hospitalization/Surgery inpatient right foot abscess 10/12-10/18/2023 2022 right foot transmet Family and Social History Cancer: Yes - Paternal Grandparents; Diabetes: Yes - Mother; Heart Disease: No; Hereditary Spherocytosis: No; Hypertension: Yes - Mother; Kidney Disease: No; Lung Disease: No; Seizures: No; Stroke: Yes - Maternal Grandparents; Thyroid Problems: No; Tuberculosis: No; Never smoker; Marital Status - Single; Alcohol Use: Rarely; Drug Use: Prior History - Marijuana; Caffeine Use: Daily; Financial Concerns: No; Food, Clothing or Shelter Needs: No; Support System Lacking: No; Transportation Concerns: No Electronic Signature(s) Signed: 09/19/2022 5:29:38 PM By: Geralyn Corwin DO Entered By: Geralyn Corwin on 09/19/2022 10:39:26 -------------------------------------------------------------------------------- SuperBill Details Patient Name: Date of Service: [name], [name] 09/19/2022 Medical Record Number: [id number] Patient Account Number: [id number] Date of Birth/Sex: Treating RN: 08/15/1980  (42 y.o. F) Primary Care Provider: Gwinda Passe Other Clinician: Referring Provider: Treating Provider/Extender: Grace Isaac in Treatment: 6 Diagnosis Coding ICD-10 Codes Code Description [phone] Non-pressure chronic ulcer of other part of right foot with fat layer exposed E11.621 Type 2 diabetes mellitus with foot ulcer M86.671 Other chronic osteomyelitis, right ankle and foot Facility Procedures : CPT4 Code: [id number] Description: 11042 - DEB SUBQ TISSUE 20 SQ  CM/< ICD-10 Diagnosis Description L97.512 Non-pressure chronic ulcer of other part of right foot with fat layer exposed E11.621 Type 2 diabetes mellitus with foot ulcer Modifier: Quantity: 1 Physician Procedures : CPT4 Code Description Modifier SHONNON, DYBDAHL ([id number]) [phone]. [id number] 11042 - WC PHYS SUBQ TISS 20 SQ CM 1 ICD-10 Diagnosis Description L97.512 Non-pressure chronic ulcer of other part of right foot with fat layer  exposed E11.621 Type 2 diabetes mellitus with foot ulcer Quantity: pdf Page 11 of 11 Electronic Signature(s) Signed: 09/19/2022 5:29:38 PM By: Geralyn Corwin DO Entered By: Geralyn Corwin on 09/19/2022 10:42:30

## 2022-09-25 NOTE — Progress Notes (Signed)
[name], [name] ([id number]) [id number] Page 1 of 8 Visit Report for 09/19/2022 Arrival Information Details Patient Name: Date of Service: [name], [place] [name] 09/19/2022 12:30 PM Medical Record Number: [id number] Patient Account Number: [id number] Date of Birth/Sex: Treating RN: [birth year] (42 y.o. F) Primary Care Maryagnes Carrasco: Gwinda Passe Other Clinician: Referring Enez Monahan: Treating Rettie Laird/Extender: Grace Isaac in Treatment: 6 Visit Information History Since Last Visit Added or deleted any medications: No Patient Arrived: Wheel Chair Any new allergies or adverse reactions: No Arrival Time: 12:52 Had a fall or experienced change in No Accompanied By: self activities of daily living that may affect Transfer Assistance: None risk of falls: Patient Identification Verified: Yes Signs or symptoms of abuse/neglect since last visito No Secondary Verification Process Completed: Yes Hospitalized since last visit: No Patient Requires Transmission-Based Precautions: No Implantable device outside of the clinic excluding No Patient Has Alerts: Yes cellular tissue based products placed in the center Patient Alerts: Patient on Blood Thinner since last visit: Eliquis Has Dressing in Place as Prescribed: Yes ABI R 1.02 (08/21/22) Pain Present Now: Yes ABI L 0.99 (08/21/22) Electronic Signature(s) Signed: 09/25/2022 4:43:35 PM By: Thayer Dallas Entered By: Thayer Dallas on 09/19/2022 12:53:54 -------------------------------------------------------------------------------- Encounter Discharge Information Details Patient Name: Date of Service: [name], [name] 09/19/2022 12:30 PM Medical Record Number: [id number] Patient Account Number: [id number] Date of Birth/Sex: Treating RN: [birth year] (42 y.o. [name] Primary Care Lajuanda Penick: Gwinda Passe Other Clinician: Referring Viridiana Spaid: Treating Paytience Bures/Extender: Grace Isaac in Treatment: 6 Encounter Discharge Information Items Post Procedure Vitals Discharge Condition: Stable Temperature (F): 98.1 Ambulatory Status: Wheelchair Pulse (bpm): 75 Discharge Destination: Home Respiratory Rate (breaths/min): 18 Transportation: Private Auto Blood Pressure (mmHg): 112/78 Accompanied By: self Schedule Follow-up Appointment: Yes Clinical Summary of Care: Patient Declined Electronic Signature(s) Signed: 09/19/2022 3:50:25 PM By: Karie Schwalbe RN Entered By: Karie Schwalbe on 09/19/2022 15:06:11 Standley, Sharol Harness ([id number]) [id number] Page 2 of 8 -------------------------------------------------------------------------------- Lower Extremity Assessment Details Patient Name: Date of Service: [name] IS, [name] 09/19/2022 12:30 PM Medical Record Number: [id number] Patient Account Number: [id number] Date of Birth/Sex: Treating RN: 11/28/1980 (42 y.o. F) Primary Care Timarion Agcaoili: Gwinda Passe Other Clinician: Referring Marshia Tropea: Treating Neno Hohensee/Extender: Grace Isaac in Treatment: 6 Edema Assessment Assessed: [Left: No] [Right: No] Edema: [Left: Ye] [Right: s] Calf Left: Right: Point of Measurement: 31 cm From Medial Instep 53 cm Ankle Left: Right: Point of Measurement: 9 cm From Medial Instep 27.5 cm Vascular Assessment Extremity colors, hair growth, and conditions: Extremity Color: [Right:Hyperpigmented] Hair Growth on Extremity: [Right:No] Temperature of Extremity: [Right:Warm] Capillary Refill: [Right:< 3 seconds] Dependent Rubor: [Right:No Yes] Electronic Signature(s) Signed: 09/25/2022 4:43:35 PM By: Thayer Dallas Entered By: Thayer Dallas on 09/19/2022 13:02:14 -------------------------------------------------------------------------------- Multi Wound Chart Details Patient Name: Date of Service: [name], [name] 09/19/2022 12:30 PM Medical Record  Number: [id number] Patient Account Number: [id number] Date of Birth/Sex: Treating RN: 05-Jan-1981 (42 y.o. F) Primary Care [name]: Gwinda Passe Other Clinician: Referring Thor Nannini: Treating Kerianna Rawlinson/Extender: Grace Isaac in Treatment: 6 Vital Signs Height(in): 69 Pulse(bpm): 75 Weight(lbs): 330 Blood Pressure(mmHg): 112/78 Body Mass Index(BMI): 48.7 Temperature(F): 98.1 Respiratory Rate(breaths/min): 18 [4:Photos:] [N/A:N/A] Right Amputation Site - N/A N/A Wound Location: Transmetatarsal Shear/Friction N/A N/A Wounding Event: Diabetic Wound/Ulcer of the Lower N/A N/A Primary Etiology: Extremity Dehisced Wound N/A N/A Secondary Etiology: Deep Vein Thrombosis, Hypertension, N/A N/A Comorbid History: Type II Diabetes, Osteomyelitis, Neuropathy 07/08/2022 N/A  N/A Date Acquired: 6 N/A N/A Weeks of Treatment: Open N/A N/A Wound Status: No N/A N/A Wound Recurrence: 0.5x1.9x0.5 N/A N/A Measurements L x W x D (cm) 0.746 N/A N/A A (cm) : rea 0.373 N/A N/A Volume (cm) : -239.10% N/A N/A % Reduction in A rea: -111.90% N/A N/A % Reduction in Volume: Grade 3 N/A N/A Classification: Medium N/A N/A Exudate A mount: Serosanguineous N/A N/A Exudate Type: red, brown N/A N/A Exudate Color: Distinct, outline attached N/A N/A Wound Margin: Small (1-33%) N/A N/A Granulation A mount: Pink N/A N/A Granulation Quality: Large (67-100%) N/A N/A Necrotic A mount: Eschar, Adherent Slough N/A N/A Necrotic Tissue: Fat Layer (Subcutaneous Tissue): Yes N/A N/A Exposed Structures: Fascia: No Tendon: No Muscle: No Joint: No Bone: No Small (1-33%) N/A N/A Epithelialization: Debridement - Excisional N/A N/A Debridement: Pre-procedure Verification/Time Out 13:20 N/A N/A Taken: Lidocaine 4% Topical Solution N/A N/A Pain Control: Callus, Subcutaneous, Slough N/A N/A Tissue Debrided: Skin/Subcutaneous Tissue N/A N/A Level: 0.75 N/A  N/A Debridement A (sq cm): rea Curette N/A N/A Instrument: Minimum N/A N/A Bleeding: Pressure N/A N/A Hemostasis A chieved: 0 N/A N/A Procedural Pain: 0 N/A N/A Post Procedural Pain: Procedure was tolerated well N/A N/A Debridement Treatment Response: 0.5x1.9x0.5 N/A N/A Post Debridement Measurements L x W x D (cm) 0.373 N/A N/A Post Debridement Volume: (cm) Callus: Yes N/A N/A Periwound Skin Texture: Excoriation: No Induration: No Crepitus: No Rash: No Scarring: No Maceration: Yes N/A N/A Periwound Skin Moisture: Dry/Scaly: No Atrophie Blanche: No N/A N/A Periwound Skin Color: Cyanosis: No Ecchymosis: No Erythema: No Hemosiderin Staining: No Mottled: No Pallor: No Rubor: No Yes N/A N/A Tenderness on Palpation: Debridement N/A N/A Procedures Performed: Treatment Notes Electronic Signature(s) Signed: 09/19/2022 5:29:38 PM By: Geralyn Corwin DO Entered By: Geralyn Corwin on 09/19/2022 13:38:07 Mccombs, Marki L ([id number]) [id number] Page 4 of 8 -------------------------------------------------------------------------------- Multi-Disciplinary Care Plan Details Patient Name: Date of Service: [name] IS, [place] [name] 09/19/2022 12:30 PM Medical Record Number: [id number] Patient Account Number: [id number] Date of Birth/Sex: Treating RN: 1980-10-17 (42 y.o. [name] Primary Care Isiaah Cuervo: Gwinda Passe Other Clinician: Referring Demarcus Thielke: Treating Milanna Kozlov/Extender: Grace Isaac in Treatment: 6 Active Inactive Necrotic Tissue Nursing Diagnoses: Knowledge deficit related to management of necrotic/devitalized tissue Goals: Necrotic/devitalized tissue will be minimized in the wound bed Date Initiated: 08/08/2022 Target Resolution Date: 01/13/2023 Goal Status: Active Patient/caregiver will verbalize understanding of reason and process for debridement of necrotic tissue Date Initiated:  08/08/2022 Target Resolution Date: 01/13/2023 Goal Status: Active Interventions: Assess patient pain level pre-, during and post procedure and prior to discharge Provide education on necrotic tissue and debridement process Notes: Nutrition Nursing Diagnoses: Impaired glucose control: actual or potential Goals: Patient/caregiver agrees to and verbalizes understanding of need to obtain nutritional consultation Date Initiated: 08/08/2022 Target Resolution Date: 01/13/2023 Goal Status: Active Patient/caregiver will maintain therapeutic glucose control Date Initiated: 08/08/2022 Target Resolution Date: 01/13/2023 Goal Status: Active Interventions: Assess HgA1c results as ordered upon admission and as needed Provide education on elevated blood sugars and impact on wound healing Provide education on nutrition Treatment Activities: Obtain HgA1c : 08/08/2022 Patient referred to Primary Care Physician for further nutritional evaluation : 08/08/2022 Notes: Orientation to the Wound Care Program Nursing Diagnoses: Knowledge deficit related to the wound healing center program Goals: Patient/caregiver will verbalize understanding of the Wound Healing Center Program Date Initiated: 08/08/2022 Target Resolution Date: 01/13/2023 Goal Status: Active Interventions: Provide education on orientation to the wound center Notes: Mcginty, [facility]  L ([id number]) [id number] Page 5 of 8 Pain, Acute or Chronic Nursing Diagnoses: Pain, acute or chronic: actual or potential Potential alteration in comfort, pain Goals: Patient will verbalize adequate pain control and receive pain control interventions during procedures as needed Date Initiated: 08/08/2022 Target Resolution Date: 01/13/2023 Goal Status: Active Patient/caregiver will verbalize comfort level met Date Initiated: 08/08/2022 Target Resolution Date: 01/13/2023 Goal Status: Active Interventions: Complete pain assessment as  per visit requirements Encourage patient to take pain medications as prescribed Provide education on pain management Treatment Activities: Administer pain control measures as ordered : 08/08/2022 Notes: Wound/Skin Impairment Nursing Diagnoses: Knowledge deficit related to ulceration/compromised skin integrity Goals: Patient/caregiver will verbalize understanding of skin care regimen Date Initiated: 08/08/2022 Target Resolution Date: 01/13/2023 Goal Status: Active Ulcer/skin breakdown will heal within 14 weeks Date Initiated: 08/08/2022 Target Resolution Date: 01/13/2023 Goal Status: Active Interventions: Assess patient/caregiver ability to perform ulcer/skin care regimen upon admission and as needed Assess ulceration(s) every visit Provide education on ulcer and skin care Treatment Activities: Skin care regimen initiated : 08/08/2022 Topical wound management initiated : 08/08/2022 Notes: Electronic Signature(s) Signed: 09/19/2022 3:50:25 PM By: Karie Schwalbe RN Entered By: Karie Schwalbe on 09/19/2022 13:27:46 -------------------------------------------------------------------------------- Pain Assessment Details Patient Name: Date of Service: [name] Pereyra, [name] 09/19/2022 12:30 PM Medical Record Number: [id number] Patient Account Number: [id number] Date of Birth/Sex: Treating RN: [birth year] (42 y.o. F) Primary Care Romi Rathel: Gwinda Passe Other Clinician: Referring Vernica Wachtel: Treating Harlan Vinal/Extender: Grace Isaac in Treatment: 6 Active Problems Location of Pain Severity and Description of Pain Patient Has Paino Yes Site Locations Pain LocationJENAI, BUCKELEW ([id number]) [phone].pdf Page 6 of 8 Pain Location: Pain in Ulcers Rate the pain. Current Pain Level: 2 Pain Management and Medication Current Pain Management: Electronic Signature(s) Signed: 09/25/2022 4:43:35 PM By: Thayer Dallas Entered By: Thayer Dallas on 09/19/2022 12:54:27 -------------------------------------------------------------------------------- Patient/Caregiver Education Details Patient Name: Date of Service: [name], [name] 9/5/2024andnbsp12:30 PM Medical Record Number: [id number] Patient Account Number: [id number] Date of Birth/Gender: Treating RN: 06/24/1980 (42 y.o. [name] Primary Care Physician: Gwinda Passe Other Clinician: Referring Physician: Treating Physician/Extender: Grace Isaac in Treatment: 6 Education Assessment Education Provided To: Patient Education Topics Provided Wound/Skin Impairment: Methods: Explain/Verbal Responses: State content correctly Electronic Signature(s) Signed: 09/19/2022 3:50:25 PM By: Karie Schwalbe RN Entered By: Karie Schwalbe on 09/19/2022 13:28:05 -------------------------------------------------------------------------------- Wound Assessment Details Patient Name: Date of Service: [name], [name] 09/19/2022 12:30 PM Medical Record Number: [id number] Patient Account Number: [id number] Date of Birth/Sex: Treating RN: 1980-01-18 (42 y.o. 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 Secondary Dehisced Wound Wounding Event: Shear/Friction Etiology: Date Acquired: 07/08/2022 Wound Status: Open Weeks Of Treatment: 6 Comorbid Deep Vein Thrombosis, Hypertension, Type II Diabetes, Clustered Wound: No History: Osteomyelitis, Neuropathy Photos Wound Measurements Length: (cm) 0.5 Width: (cm) 1.9 Depth: (cm) 0.5 Area: (cm) 0.746 Volume: (cm) 0.373 % Reduction in Area:  -239.1% % Reduction in Volume: -111.9% Epithelialization: Small (1-33%) Tunneling: No Undermining: No Wound Description Classification: Grade 3 Wound Margin: Distinct, outline attached Exudate Amount: Medium Exudate Type: Serosanguineous Exudate Color: red, brown Foul Odor After Cleansing: No Slough/Fibrino Yes Wound Bed Granulation Amount: Small (1-33%) Exposed Structure Granulation Quality: Pink Fascia Exposed:  No Necrotic Amount: Large (67-100%) Fat Layer (Subcutaneous Tissue) Exposed: Yes Necrotic Quality: Eschar, Adherent Slough Tendon Exposed: No Muscle Exposed: No Joint Exposed: No Bone Exposed: No Periwound Skin Texture Texture Color No Abnormalities Noted: No No Abnormalities Noted: No Callus: Yes Atrophie Blanche: No Crepitus: No Cyanosis: No Excoriation: No Ecchymosis: No Induration: No Erythema: No Rash: No Hemosiderin Staining: No Scarring: No Mottled: No Pallor: No Moisture Rubor: No No Abnormalities Noted: No Dry / Scaly: No Temperature / Pain Maceration: Yes Tenderness on Palpation: Yes Treatment Notes Wound #4 (Amputation Site - Transmetatarsal) Wound Laterality: Right Cleanser Peri-Wound Care Topical Primary Dressing Maxorb Extra Ag+ Alginate Dressing, 2x2 (in/in) Discharge Instruction: Apply to wound bed as instructed Beidleman, Debria L ([id number]) [id number] Page 8 of 8 Secondary Dressing ABD Pad, 8x10 Discharge Instruction: Apply over primary dressing as directed. Secured With [employer], 4.5x3.1 (in/yd) Discharge Instruction: Secure with Kerlix as directed. 36M Medipore H Soft Cloth Surgical T ape, 4 x 10 (in/yd) Discharge Instruction: Secure with tape as directed. ace wrap Discharge Instruction: apply lightly to hold dressing in place. Compression Wrap Compression Stockings Add-Ons Electronic Signature(s) Signed: 09/19/2022 3:50:25 PM By: Karie Schwalbe RN Entered By: Karie Schwalbe on  09/19/2022 13:14:28 -------------------------------------------------------------------------------- Vitals Details Patient Name: Date of Service: [name], [name] 09/19/2022 12:30 PM Medical Record Number: [id number] Patient Account Number: [id number] Date of Birth/Sex: Treating RN: [birth year] (42 y.o. F) Primary Care Laetitia Schnepf: Gwinda Passe Other Clinician: Referring Burris Matherne: Treating Evalin Shawhan/Extender: Grace Isaac in Treatment: 6 Vital Signs Time Taken: 13:08 Temperature (F): 98.1 Height (in): 69 Pulse (bpm): 75 Weight (lbs): 330 Respiratory Rate (breaths/min): 18 Body Mass Index (BMI): 48.7 Blood Pressure (mmHg): 112/78 Reference Range: 80 - 120 mg / dl Electronic Signature(s) Signed: 09/25/2022 4:43:35 PM By: Thayer Dallas Entered By: Thayer Dallas on 09/19/2022 13:08:32

## 2022-09-26 ENCOUNTER — Encounter (HOSPITAL_BASED_OUTPATIENT_CLINIC_OR_DEPARTMENT_OTHER): Payer: Medicaid Other | Admitting: Internal Medicine

## 2022-09-26 DIAGNOSIS — E11621 Type 2 diabetes mellitus with foot ulcer: Secondary | ICD-10-CM

## 2022-09-26 DIAGNOSIS — L97512 Non-pressure chronic ulcer of other part of right foot with fat layer exposed: Secondary | ICD-10-CM | POA: Diagnosis not present

## 2022-09-26 NOTE — Progress Notes (Addendum)
SARRIAH, LIME ([id number]) [id number] Page 1 of 9 Visit Report for 09/26/2022 Arrival Information Details Patient Name: Date of Service: [name] [name], [place] [name] 09/26/2022 12:30 PM Medical Record Number: [id number] Patient Account Number: [id number] Date of Birth/Sex: Treating RN: [birth year] (42 y.o. [name] Primary Care [name]: [name] Other Clinician: Referring [name]: Treating [name]/Extender: [name] in Treatment: 7 Visit Information History Since Last Visit Added or deleted any medications: No Patient Arrived: Wheel Chair Any new allergies or adverse reactions: No Arrival Time: 12:37 Had a fall or experienced change in No Accompanied By: self activities of daily living that may affect Transfer Assistance: None risk of falls: Patient Identification Verified: Yes Signs or symptoms of abuse/neglect since last visito No Patient Requires Transmission-Based Precautions: No Hospitalized since last visit: No Patient Has Alerts: Yes Implantable device outside of the clinic excluding No Patient Alerts: Patient on Blood Thinner cellular tissue based products placed in the center Eliquis since last visit: ABI R 1.02 (08/21/22) Has Dressing in Place as Prescribed: Yes ABI L 0.99 (08/21/22) Pain Present Now: No Electronic Signature(s) Signed: 09/26/2022 1:10:17 PM By: [name] Schwalbe RN Entered By: [name] on 09/26/2022 12:52:49 -------------------------------------------------------------------------------- Encounter Discharge Information Details Patient Name: Date of Service: [name], [name] [name] 09/26/2022 12:30 PM Medical Record Number: [id number] Patient Account Number: [id number] Date of Birth/Sex: Treating RN: 05-May-1980 (42 y.o. [name] Primary Care [name]: [name] Other Clinician: Referring [name]: Treating [name]/Extender: [name]  in Treatment: 7 Encounter Discharge Information Items Post Procedure Vitals Discharge Condition: Stable Temperature (F): 98 Ambulatory Status: Wheelchair Pulse (bpm): 82 Discharge Destination: Home Respiratory Rate (breaths/min): 16 Transportation: Private Auto Blood Pressure (mmHg): 110/75 Accompanied By: self Schedule Follow-up Appointment: Yes Clinical Summary of Care: Patient Declined Electronic Signature(s) Signed: 09/26/2022 1:10:17 PM By: [name] Schwalbe RN Entered By: [name] on 09/26/2022 13:09:20 [name] ([id number]) [id number] Page 2 of 9 -------------------------------------------------------------------------------- Lower Extremity Assessment Details Patient Name: Date of Service: [name] [name], [name] [name] 09/26/2022 12:30 PM Medical Record Number: [id number] Patient Account Number: [id number] Date of Birth/Sex: Treating RN: 01/19/1980 (42 y.o. [name] Primary Care [name]: [name] Other Clinician: Referring [name]: Treating Nevah Dalal/Extender: [name] Weeks in Treatment: 7 Edema Assessment Assessed: [Left: No] [Right: No] Edema: [Left: Ye] [Right: s] Calf Left: Right: Point of Measurement: 31 cm From Medial Instep 52 cm Ankle Left: Right: Point of Measurement: 9 cm From Medial Instep 27.5 cm Vascular Assessment Pulses: Dorsalis Pedis Palpable: [Right:Yes] Extremity colors, hair growth, and conditions: Extremity Color: [Right:Hyperpigmented] Hair Growth on Extremity: [Right:No] Temperature of Extremity: [Right:Warm] Capillary Refill: [Right:< 3 seconds] Dependent Rubor: [Right:No Yes] Electronic Signature(s) Signed: 09/26/2022 1:10:17 PM By: [name] Schwalbe RN Entered By: [name] on 09/26/2022 12:53:52 -------------------------------------------------------------------------------- Multi Wound Chart Details Patient Name: Date of Service: [name] [name], [name] [name]  09/26/2022 12:30 PM Medical Record Number: [id number] Patient Account Number: [id number] Date of Birth/Sex: Treating RN: Jul 04, 1980 (42 y.o. F) Primary Care [name]: [name] Other Clinician: Referring [name]: Treating [name]/Extender: [name] in Treatment: 7 Vital Signs Height(in): 69 Pulse(bpm): 82 Weight(lbs): 330 Blood Pressure(mmHg): 110/75 Body Mass Index(BMI): 48.7 Temperature(F): 98 Respiratory Rate(breaths/min): 16 [4:Photos:] [N/A:N/A [id number] Page 3 of 9] Right Amputation Site - N/A N/A Wound Location: Transmetatarsal Shear/Friction N/A N/A Wounding Event: Diabetic Wound/Ulcer of the Lower N/A N/A Primary Etiology: Extremity Dehisced Wound N/A N/A Secondary Etiology: Deep Vein Thrombosis, Hypertension,  N/A N/A Comorbid History: Type II Diabetes, Osteomyelitis, Neuropathy 07/08/2022 N/A N/A Date Acquired: 7 N/A N/A Weeks of Treatment: Open N/A N/A Wound Status: No N/A N/A Wound Recurrence: 0.5x1.2x0.3 N/A N/A Measurements L x W x D (cm) 0.471 N/A N/A A (cm) : rea 0.141 N/A N/A Volume (cm) : -114.10% N/A N/A % Reduction in A rea: 19.90% N/A N/A % Reduction in Volume: Grade 3 N/A N/A Classification: Medium N/A N/A Exudate A mount: Serosanguineous N/A N/A Exudate Type: red, brown N/A N/A Exudate Color: Distinct, outline attached N/A N/A Wound Margin: Small (1-33%) N/A N/A Granulation A mount: Pink N/A N/A Granulation Quality: Large (67-100%) N/A N/A Necrotic A mount: Eschar, Adherent Slough N/A N/A Necrotic Tissue: Fat Layer (Subcutaneous Tissue): Yes N/A N/A Exposed Structures: Fascia: No Tendon: No Muscle: No Joint: No Bone: No Small (1-33%) N/A N/A Epithelialization: Debridement - Excisional N/A N/A Debridement: Pre-procedure Verification/Time Out 12:59 N/A N/A Taken: Lidocaine 4% Topical Solution N/A N/A Pain Control: Callus, Subcutaneous, Slough N/A  N/A Tissue Debrided: Skin/Subcutaneous Tissue N/A N/A Level: 0.47 N/A N/A Debridement A (sq cm): rea Curette N/A N/A Instrument: Minimum N/A N/A Bleeding: Pressure N/A N/A Hemostasis A chieved: 0 N/A N/A Procedural Pain: 0 N/A N/A Post Procedural Pain: Procedure was tolerated well N/A N/A Debridement Treatment Response: 0.5x1.2x0.3 N/A N/A Post Debridement Measurements L x W x D (cm) 0.141 N/A N/A Post Debridement Volume: (cm) Callus: Yes N/A N/A Periwound Skin Texture: Excoriation: No Induration: No Crepitus: No Rash: No Scarring: No Maceration: Yes N/A N/A Periwound Skin Moisture: Dry/Scaly: No Atrophie Blanche: No N/A N/A Periwound Skin Color: Cyanosis: No Ecchymosis: No Erythema: No Hemosiderin Staining: No Mottled: No Pallor: No Rubor: No Yes N/A N/A Tenderness on Palpation: Debridement N/A N/A Procedures Performed: Treatment Notes Wound #4 (Amputation Site - Transmetatarsal) Wound Laterality: Right Cleanser Peri-Wound Care Topical [name], [name] L ([id number]) [id number] Page 4 of 9 Primary Dressing Maxorb Extra Ag+ Alginate Dressing, 2x2 (in/in) Discharge Instruction: Apply to wound bed as instructed Secondary Dressing ABD Pad, 8x10 Discharge Instruction: Apply over primary dressing as directed. Secured With [employer], 4.5x3.1 (in/yd) Discharge Instruction: Secure with Kerlix as directed. 37M Medipore H Soft Cloth Surgical T ape, 4 x 10 (in/yd) Discharge Instruction: Secure with tape as directed. ace wrap Discharge Instruction: apply lightly to hold dressing in place. Compression Wrap Compression Stockings Add-Ons Electronic Signature(s) Signed: 09/26/2022 3:50:21 PM By: Geralyn Corwin DO Entered By: Geralyn Corwin on 09/26/2022 14:17:38 -------------------------------------------------------------------------------- Multi-Disciplinary Care Plan Details Patient Name: Date of Service: [name] [name], [name]  [name] 09/26/2022 12:30 PM Medical Record Number: [id number] Patient Account Number: [id number] Date of Birth/Sex: Treating RN: [birth year] (42 y.o. [name] Primary Care Enslee Bibbins: [name] Other Clinician: Referring Josephene Marrone: Treating Erico Stan/Extender: [name] in Treatment: 7 Active Inactive Necrotic Tissue Nursing Diagnoses: Knowledge deficit related to management of necrotic/devitalized tissue Goals: Necrotic/devitalized tissue will be minimized in the wound bed Date Initiated: 08/08/2022 Target Resolution Date: 01/13/2023 Goal Status: Active Patient/caregiver will verbalize understanding of reason and process for debridement of necrotic tissue Date Initiated: 08/08/2022 Target Resolution Date: 01/13/2023 Goal Status: Active Interventions: Assess patient pain level pre-, during and post procedure and prior to discharge Provide education on necrotic tissue and debridement process Notes: Nutrition Nursing Diagnoses: Impaired glucose control: actual or potential Goals: Patient/caregiver agrees to and verbalizes understanding of need to obtain nutritional consultation Date Initiated: 08/08/2022 Target Resolution Date: 01/13/2023 CHERYLIN, TIMMERMANS ([id number]) [phone].pdf Page 5 of 9 Goal Status:  Active Patient/caregiver will maintain therapeutic glucose control Date Initiated: 08/08/2022 Target Resolution Date: 01/13/2023 Goal Status: Active Interventions: Assess HgA1c results as ordered upon admission and as needed Provide education on elevated blood sugars and impact on wound healing Provide education on nutrition Treatment Activities: Obtain HgA1c : 08/08/2022 Patient referred to Primary Care Physician for further nutritional evaluation : 08/08/2022 Notes: Orientation to the Wound Care Program Nursing Diagnoses: Knowledge deficit related to the wound healing center program Goals: Patient/caregiver  will verbalize understanding of the Wound Healing Center Program Date Initiated: 08/08/2022 Target Resolution Date: 01/13/2023 Goal Status: Active Interventions: Provide education on orientation to the wound center Notes: Pain, Acute or Chronic Nursing Diagnoses: Pain, acute or chronic: actual or potential Potential alteration in comfort, pain Goals: Patient will verbalize adequate pain control and receive pain control interventions during procedures as needed Date Initiated: 08/08/2022 Target Resolution Date: 01/13/2023 Goal Status: Active Patient/caregiver will verbalize comfort level met Date Initiated: 08/08/2022 Target Resolution Date: 01/13/2023 Goal Status: Active Interventions: Complete pain assessment as per visit requirements Encourage patient to take pain medications as prescribed Provide education on pain management Treatment Activities: Administer pain control measures as ordered : 08/08/2022 Notes: Wound/Skin Impairment Nursing Diagnoses: Knowledge deficit related to ulceration/compromised skin integrity Goals: Patient/caregiver will verbalize understanding of skin care regimen Date Initiated: 08/08/2022 Target Resolution Date: 01/13/2023 Goal Status: Active Ulcer/skin breakdown will heal within 14 weeks Date Initiated: 08/08/2022 Target Resolution Date: 01/13/2023 Goal Status: Active Interventions: Assess patient/caregiver ability to perform ulcer/skin care regimen upon admission and as needed Assess ulceration(s) every visit Provide education on ulcer and skin care Treatment Activities: Skin care regimen initiated : 08/08/2022 Topical wound management initiated : 08/08/2022 Notes: SHONTELL, FONDA ([id number]) [id number].pdf Page 6 of 9 Electronic Signature(s) Signed: 09/26/2022 1:10:17 PM By: [name] Schwalbe RN Entered By: [name] on 09/26/2022  13:06:41 -------------------------------------------------------------------------------- Pain Assessment Details Patient Name: Date of Service: [name] [name], [name] [name] 09/26/2022 12:30 PM Medical Record Number: [id number] Patient Account Number: [id number] Date of Birth/Sex: Treating RN: 02-Oct-1980 (42 y.o. [name] Primary Care Marcella Dunnaway: [name] Other Clinician: Referring Trashawn Oquendo: Treating Shaivi Rothschild/Extender: [name] in Treatment: 7 Active Problems Location of Pain Severity and Description of Pain Patient Has Paino No Site Locations Pain Management and Medication Current Pain Management: Electronic Signature(s) Signed: 09/26/2022 1:10:17 PM By: [name] Schwalbe RN Entered By: [name] on 09/26/2022 12:53:33 -------------------------------------------------------------------------------- Patient/Caregiver Education Details Patient Name: Date of Service: [name] [name], [name] [name] 9/12/2024andnbsp12:30 PM Medical Record Number: [id number] Patient Account Number: [id number] Date of Birth/Gender: Treating RN: 10-01-1980 (42 y.o. [name] Primary Care Physician: [name] Other Clinician: Referring Physician: Treating Physician/Extender: [name] in Treatment: 7 Education Assessment Education Provided To: [name], [name] ([id number]) [id number] Page 7 of 9 Patient Education Topics Provided Wound/Skin Impairment: Methods: Explain/Verbal Responses: State content correctly Electronic Signature(s) Signed: 09/26/2022 1:10:17 PM By: [name] Schwalbe RN Entered By: [name] on 09/26/2022 13:07:03 -------------------------------------------------------------------------------- Wound Assessment Details Patient Name: Date of Service: [name] [name], [name] [name] 09/26/2022 12:30 PM Medical Record Number: [id number] Patient Account Number: [id number] Date of Birth/Sex:  Treating RN: [birth year] (42 y.o. 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 Secondary Dehisced Wound Wounding Event: Shear/Friction Etiology: Date Acquired: 07/08/2022 Wound Status:  Open Weeks Of Treatment: 7 Comorbid Deep Vein Thrombosis, Hypertension, Type II Diabetes, Clustered Wound: No History: Osteomyelitis, Neuropathy Photos Wound Measurements Length: (cm) 0.5 Width: (cm) 1.2 Depth: (cm) 0.3 Area: (cm) 0.471 Volume: (cm) 0.141 % Reduction in Area: -114.1% % Reduction in Volume: 19.9% Epithelialization: Small (1-33%) Wound Description Classification: Grade 3 Wound Margin: Distinct, outline attached Exudate Amount: Medium Exudate Type: Serosanguineous Exudate Color: red, brown Foul Odor After Cleansing: No Slough/Fibrino Yes Wound Bed Granulation Amount: Small (1-33%) Exposed Structure Granulation Quality: Pink Fascia Exposed: No Necrotic Amount: Large (67-100%) Fat Layer (Subcutaneous Tissue) Exposed: Yes Necrotic Quality: Eschar, Adherent Slough Tendon Exposed: No Muscle Exposed: No Joint Exposed: No [name], [name] L ([id number]) [id number] Page 8 of 9 Bone Exposed: No Periwound Skin Texture Texture Color No Abnormalities Noted: No No Abnormalities Noted: No Callus: Yes Atrophie Blanche: No Crepitus: No Cyanosis: No Excoriation: No Ecchymosis: No Induration: No Erythema: No Rash: No Hemosiderin Staining: No Scarring: No Mottled: No Pallor: No Moisture Rubor: No No Abnormalities Noted: No Dry / Scaly: No Temperature / Pain Maceration: Yes Tenderness on Palpation: Yes Treatment Notes Wound #4 (Amputation Site - Transmetatarsal) Wound  Laterality: Right Cleanser Peri-Wound Care Topical Primary Dressing Maxorb Extra Ag+ Alginate Dressing, 2x2 (in/in) Discharge Instruction: Apply to wound bed as instructed Secondary Dressing ABD Pad, 8x10 Discharge Instruction: Apply over primary dressing as directed. Secured With [employer], 4.5x3.1 (in/yd) Discharge Instruction: Secure with Kerlix as directed. 80M Medipore H Soft Cloth Surgical T ape, 4 x 10 (in/yd) Discharge Instruction: Secure with tape as directed. ace wrap Discharge Instruction: apply lightly to hold dressing in place. Compression Wrap Compression Stockings Add-Ons Electronic Signature(s) Signed: 09/26/2022 1:10:17 PM By: [name] Schwalbe RN Entered By: [name] on 09/26/2022 12:48:33 -------------------------------------------------------------------------------- Vitals Details Patient Name: Date of Service: [name], [name] [name] 09/26/2022 12:30 PM Medical Record Number: [id number] Patient Account Number: [id number] Date of Birth/Sex: Treating RN: 09/07/1980 (42 y.o. [name] Primary Care Hanaan Gancarz: [name] Other Clinician: Referring Lorri Fukuhara: Treating Michelene Keniston/Extender: [name] in Treatment: 7 Vital Signs Time Taken: 12:40 Temperature (F): 98 Height (in): 69 Pulse (bpm): 82 Weight (lbs): 330 Respiratory Rate (breaths/min): 16 Body Mass Index (BMI): 48.7 Blood Pressure (mmHg): 110/75 [name], [name] L ([id number]) [id number] Page 9 of 9 Reference Range: 80 - 120 mg / dl Electronic Signature(s) Signed: 09/26/2022 1:10:17 PM By: [name] Schwalbe RN Entered By: [name] on 09/26/2022 12:53:24

## 2022-09-26 NOTE — Progress Notes (Signed)
[name] ([id number]) [id number] Page 1 of 11 Visit Report for 09/26/2022 Chief Complaint Document Details Patient Name: Date of Service: [name] [name], [place] [name]. 09/26/2022 12:30 PM Medical Record Number: [id number] Patient Account Number: [id number] Date of Birth/Sex: Treating RN: Mar 04, 1980 (42 y.o. F) Primary Care Provider: Gwinda Passe Other Clinician: Referring Provider: Treating Provider/Extender: Grace Isaac in Treatment: 7 Information Obtained from: Patient Chief Complaint 01/22/2021; Osteomyelitis of the right foot status post transmetatarsal amputation with surgical site dehiscence 08/08/2022; reopening of wound to the right foot with a history of transmetatarsal amputation with previous difficulty healing surgical site Electronic Signature(s) Signed: 09/26/2022 3:50:21 PM By: Geralyn Corwin DO Entered By: Geralyn Corwin on 09/26/2022 14:18:05 -------------------------------------------------------------------------------- Debridement Details Patient Name: Date of Service: [name] [name], MA [name]. 09/26/2022 12:30 PM Medical Record Number: [id number] Patient Account Number: [id number] Date of Birth/Sex: Treating RN: February 17, 1980 (42 y.o. Katrinka Blazing Primary Care Provider: Gwinda Passe Other Clinician: Referring Provider: Treating Provider/Extender: Grace Isaac in Treatment: 7 Debridement Performed for Assessment: Wound #4 Right Amputation Site - Transmetatarsal Performed By: Physician Geralyn Corwin, DO The following information was scribed by: Karie Schwalbe The information was scribed for: Geralyn Corwin Debridement Type: Debridement Severity of Tissue Pre Debridement: Fat layer exposed Level of Consciousness (Pre-procedure): Awake and Alert Pre-procedure Verification/Time Out Yes - 12:59 Taken: Start Time: 12:59 Pain Control: Lidocaine 4% T opical Solution Percent  of Wound Bed Debrided: 100% T Area Debrided (cm): otal 0.47 Tissue and other material debrided: Viable, Non-Viable, Callus, Slough, Subcutaneous, Slough Level: Skin/Subcutaneous Tissue Debridement Description: Excisional Instrument: Curette Bleeding: Minimum Hemostasis Achieved: Pressure End Time: 13:01 Procedural Pain: 0 Post Procedural Pain: 0 Response to Treatment: Procedure was tolerated well Level of Consciousness (Post- Awake and Alert procedure): Post Debridement Measurements of Total Wound Length: (cm) 0.5 Width: (cm) 1.2 [name], [name] ([id number]) [id number] Page 2 of 11 Depth: (cm) 0.3 Volume: (cm) 0.141 Character of Wound/Ulcer Post Debridement: Improved Severity of Tissue Post Debridement: Fat layer exposed Post Procedure Diagnosis Same as Pre-procedure Electronic Signature(s) Signed: 09/26/2022 1:10:17 PM By: Karie Schwalbe RN Signed: 09/26/2022 3:50:21 PM By: Geralyn Corwin DO Entered By: Karie Schwalbe on 09/26/2022 13:06:09 -------------------------------------------------------------------------------- HPI Details Patient Name: Date of Service: [name] V IS, MA [name]. 09/26/2022 12:30 PM Medical Record Number: [id number] Patient Account Number: [id number] Date of Birth/Sex: Treating RN: 1980-01-30 (42 y.o. F) Primary Care Provider: Gwinda Passe Other Clinician: Referring Provider: Treating Provider/Extender: Grace Isaac in Treatment: 7 History of Present Illness HPI Description: Admission 01/22/2021 [name] a 42 year old female with a past medical history of insulin-dependent uncontrolled type 2 diabetes with last hemoglobin A1c of 13.5, osteomyelitis of the right foot status post transmetatarsal amputation on 12/18/2020 that presents to the clinic for right foot wound. She has had dehiscence of the surgical site. [name] using wet-to-dry dressings. She has a PICC line and  receiving IV ceftriaxone daily for her osteomyelitis. There [name] an end date of 01/27/2021. [name] also taking oral metronidazole. She currently denies systemic signs of infection. 1/19; patient presents for follow-up. She was diagnosed with a DVT to the right lower extremity 2 days ago. [name] on Eliquis now. [name] scheduled to see her infectious disease doctor tomorrow. She has been using Dakin's wet-to-dry dressings. She denies systemic signs of infection. 1/26; patient presents for follow-up. She saw infectious disease on 1/21 started on Augmentin. Her PICC line and IV ceftriaxone  was discontinued. Patient reports stability to her wound. She has been using Dakin's wet-to-dry dressings. She currently denies systemic signs of infection. 2/3; patient presents for follow-up. She continues to use Dakin's wet-to-dry dressings to the wound bed. She saw Dr. Manson Passey with infectious disease yesterday and [name] continuing Augmentin. T entative end date [name] 2/16. Patient reports following up with orthopedics. She states there [name] no further plan from them. She currently denies systemic signs of infection. 2/10; patient presents for follow-up. She continues to use Dakin's wet to dry dressings. [name] scheduled to have her MRI done on 2/14. She states that she had pain to the debridement site from last clinic visit and declines debridement today. She denies systemic signs of infection. She continues to have yellow thick drainage. 2/20; patient presents for follow-up. She continues to use Dakin's wet-to-dry dressings. She obtained her MRI. The results showed an abscess and [name] scheduled to see her orthopedic surgeon on 2/23. She saw infectious disease 2/17 and her antibiotics were extended. She currently denies systemic signs of infection. 3/6; patient presents for follow-up. She had debridement and irrigation of her foot on 03/10/2021 due to abscess noted on MRI. She was started on IV ceftriaxone and oral Flagyl.  She has no issues or complaints today. She has been using iodoform packing to the tunnel and Dakin's wet-to-dry to the opening. 03/26/2021: She continues on IV ceftriaxone and oral metronidazole. She has follow-up with infectious disease tomorrow. No significant issues or complaints today. Her mother continues to help her with her wound dressing, using iodoform packing strips into the tunnel and Dakin's to the open portion of the wound. 3/20; patient presents for follow-up. She continues to be on IV ceftriaxone in oral metronidazole. She has been using iodoform to the tunnel and Dakin's wet-to- dry to the open wound. She denies signs of infection. 3/27; patient presents for follow-up. She no longer has a PICC line. She has been using iodoform to the tunnel and Dakin's wet-to-dry to the open wound. She reports improvement in wound healing. She denies signs of infection. 4/3; patient presents for follow-up. She states she has been using Hydrofera Blue to the open wound and iodoform packing to the tunnel without any issues. She denies signs of infection. 4/18; patient presents for follow-up. She saw infectious disease on 4/11. She has finished her oral antibiotics and completed a total of 6 weeks of antibiotics (this includes IV as well). No further antibiotics needed. She has been using Hydrofera Blue and iodoform packing. She states that the tunneled area has come in and the iodoform [name] not staying in place anymore. She has no issues or complaints today. She denies signs of infection. 4/24; patient presents for follow-up. She saw Dr. Carlene Coria, plastic surgery to discuss potential skin graft/substitute placement. At this time he thinks that the skin graft would likely not take. He [name] in agreement with trying a wound VAC. Patient has been using Hydrofera Blue dressing changes with no issues. She denies signs of infection. She reports improvement in wound healing. 5/1; patient presents for follow-up.  Unfortunately patient did not have insurance when we ran for the pico. There [name] an assistance program and we are trying to get this accommodated for the patient. In the meantime she has been using Hydrofera Blue without any issues. She denies signs of infection. MARAINA, GROSZ ([id number]) [id number] Page 3 of 11 5/8; patient presents for follow-up. We have not heard back if pico [name] covered by her insurance.  She has been using collagen to the wound bed over the past week. She denies signs of infection. 5/18; patient presents for follow-up. She has been using collagen to the wound bed without issues. Again we have not heard if pico [name] covered by her insurance. She has no issues or complaints today. 5/23; patient presents for follow-up. She has been using collagen to the wound bed. She has no issues or complaints today. She obtained the wound VAC from [facility] and brought this in today. She denies signs of infection. 6/1; patient presents for follow-up. She has been using the wound VAC for the past week. She has had this changed twice since she was last here. She reports more maceration to the periwound. She denies signs of infection. 6/7; right TMA site. There are 2 wounds 0 separated by a bridge of healed tissue. The more lateral area has undermining. Both areas have healthy looking granulation at the base but relative the size of the wound [name] fairly deep. There [name] no exposed bone no evidence of infection. Her wound VAC was put on hold last week because of surrounding skin maceration she has been using collagen this week. She has a modified shoe 6/12; patient presents for follow-up. Last week the wound VAC was reinitiated. She had no issues with the wound VAC itself. Today she has maceration again noted to the surrounding skin. She denies signs of infection. 6/27; patient presents for follow-up. She has been using Medihoney to the wound bed. We took a break from the wound VAC  because the periwound was macerated. She still has some areas of maceration to the distal foot where there [name] a callus. She currently denies signs of infection. 7/11; patient presents for follow-up. She has been using Medihoney to the wound bed. She has developed some increased warmth and erythema to the lateral aspect of the right foot. She states this [name] occurred over the past week and there [name] increased pain. No drainage noted. 7/17; patient presents for follow-up. She has been using Medihoney to the wound bed. She completed her course of Bactrim. She reports improvement in symptoms. 7/24; Patient presents for follow up. She has been using Medihoney to the wound bed without issues. She completed another course of Bactrim. She reports improvement in her symptoms but still has some mild tenderness to the medial aspect of the foot. 7/31; Patient presents for follow-up. She has been using Medihoney and Dakin's to the wound bed. She denies signs of infection. 8/14; patient presents for follow-up. She has been using Dakin's wet-to-dry packing strips to the right medial aspect of the amputation site and Medihoney to the anterior site. She has no issues or complaints today. She has started physical therapy. She denies signs of infection. 8/29; patient presents for follow-up. She has been using Dakin's wet-to-dry packing strips to the right medial aspect of the amputation site however this [name] becoming more difficult to place. She did report that she had increased redness and swelling to that site and developed some drainage. It has resolved. She continues with physical therapy. 9/8; patient presents for follow-up. We have been using silver alginate to the tunneled area. She completed her course of antibiotics. She reports no pain, increased swelling or erythema. 9/21; patient presents for follow-up. She has been using gentamicin to the tunneled area. She has no issues or complaints today. She denies signs  of infection. 10/2; patient presents for follow-up. [name] scheduled to have her CT scan on 10/9. She currently  denies signs of infection. She denies increased warmth, erythema or purulent drainage from the wound bed. She has been using Dakin's wet-to-dry dressings. 10/12; patient presents for follow-up. She had her CT scan on 10/9 that showed A small irregular rim-enhancing fluid pocket communicating to the overlying soft tissues of the sinus tract compatible with a small abscess. Currently she denies systemic signs of infection. She has been doing Dakin's wet-to-dry packing strips but it [name] hard for her to pack into the narrow opening. 10/30; patient presents for follow-up. Since last clinic visit she has had OR debridement of her Right foot by Dr. Odis Hollingshead Due to abscess noted on CT. She has been using iodoform packing. 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 She will complete 2 weeks of this and continue with oral amoxicillin for the next 6 to 12 months. She follows with Dr. Luciana Axe for this. She currently denies signs of infection. 11/13; patient presents for follow-up. Patient has been using silver alginate with gentamicin to the wound bed. She has no issues or complaints today she. She reports improvement in wound healing. 12/4; patient presents for follow-up. She has been using silver alginate and gentamicin to the wound bed. She has no issues or complaints today. 12/8; patient presents for follow-up. She has been using silver alginate with gentamicin to the wound bed. She presents for her first cast placement. She will be back early next week for her obligatory cast change. 12/12; patient presents for follow-up. We have been using collagen with antibiotic ointment to the wound bed under a total contact cast. She has tolerated this well. [name] improved in wound healing. 12/18; patient presents for follow-up. We have been using collagen with antibiotic  ointment under the total contact cast. She has no complaints today. 1/2; The cast was placed at last clinic visit however patient missed her follow-up and this was taken off last week at a nurse visit. Patient has been using packing strips to the medial narrowed wound. She has noted no drainage. 1/22; patient presents for follow-up. She has been keeping the area covered. She reports no drainage. She has had no issues to the previous wound site. She denies any signs of infection including increased warmth, erythema or purulent drainage. 08/08/2022 [name] a 42 year old female with a past medical history of insulin-dependent uncontrolled type 2 diabetes with last hemoglobin A1c of 8.8, chronic osteomyelitis of the right foot status post transmetatarsal amputation on 12/18/2020 that has been previously treated for surgical dehiscence of this site in our clinic. She presents today 6 months after discharge from our clinic due to healed right foot wound with now reopening of the previous surgical site. She reports chronic pain to the site however denies purulent drainage, increased warmth or erythema. She noticed the wound opening about 1 month ago. She has been doing physical therapy without significant issues. She has inserts to her shoe however she has been advised to follow-up with [facility] for new custom inserts by her orthopedic surgeon now that she has a new wound. She states she will try and do this. She has been using antibiotic ointment to the wound bed. 8/1; patient presents for follow-up. She has been taking her antibiotics without issues. She has been using Dakin's wet-to-dry dressings to the wound bed. The wound [name] smaller. She denies systemic signs of infection. 8/13; patient presents for follow-up. She had ABIs completed that were normal. On the right it was 1.02 and  on the left was 0.99. She has been using silver alginate to the wound bed with gentamicin ointment. She states it  [name] hard to pack the wound bed. She currently denies signs of infection. 8/29; patient presents for follow-up. She has been using collagen with antibiotic ointment to the wound bed. Wound [name] stable. She denies signs of infection. [name], [name] ([id number]) [id number] Page 4 of 11 9/5; patient presents for follow-up. She has been using silver alginate to the wound bed. Wound [name] slightly wider. She denies signs of infection. 9/12; patient presents for follow-up. She has been using silver alginate to the wound bed. Wound [name] slightly smaller today. Electronic Signature(s) Signed: 09/26/2022 3:50:21 PM By: Geralyn Corwin DO Entered By: Geralyn Corwin on 09/26/2022 14:18:36 -------------------------------------------------------------------------------- Physical Exam Details Patient Name: Date of Service: [name] V IS, MA [name]. 09/26/2022 12:30 PM Medical Record Number: [id number] Patient Account Number: [id number] Date of Birth/Sex: Treating RN: [birth year] (42 y.o. F) Primary Care Provider: Gwinda Passe Other Clinician: Referring Provider: Treating Provider/Extender: Grace Isaac in Treatment: 7 Constitutional respirations regular, non-labored and within target range for patient.. Cardiovascular 2+ dorsalis pedis/posterior tibialis pulses. Psychiatric pleasant and cooperative. Notes T the right foot there [name] an open wound Along the transmetatarsal previous amputation site with nonviable tissue and postdebridement granulation tissue o present. Does not probe to bone. No purulent drainage or fluctuance noted. No increased warmth or erythema. minimal Maceration to the periwound. Electronic Signature(s) Signed: 09/26/2022 3:50:21 PM By: Geralyn Corwin DO Entered By: Geralyn Corwin on 09/26/2022 14:19:20 -------------------------------------------------------------------------------- Physician Orders Details Patient Name: Date of  Service: [name] V IS, MA [name]. 09/26/2022 12:30 PM Medical Record Number: [id number] Patient Account Number: [id number] Date of Birth/Sex: Treating RN: 1980/05/08 (42 y.o. Katrinka Blazing Primary Care Provider: Gwinda Passe Other Clinician: Referring Provider: Treating Provider/Extender: Grace Isaac in Treatment: 7 Verbal / Phone Orders: No Diagnosis Coding Follow-up Appointments ppointment in 1 week. - Dr. Leanord Hawking 10/03/22 at 12:30pm Return A room 9 Other: - -Already DONE Vascular and vein appointment 08/21/22. T oral antibiotics ake call [facility] to get shoe adjusted. [phone] [address], [place]-DONE Anesthetic (In clinic) Topical Lidocaine 4% applied to wound bed Cellular or Tissue Based Products Wound #4 Right Amputation Site - Transmetatarsal MELENIE, TORIO [name] ([id number]) [phone].pdf Page 5 of 11 Cellular or Tissue Based Product Type: - RUN IVR for APLIGRAF (09/12/22)-SENT NOTES Bathing/ Shower/ Hygiene May shower with protection but do not get wound dressing(s) wet. Protect dressing(s) with water repellant cover (for example, large plastic bag) or a cast cover and may then take shower. Edema Control - Lymphedema / SCD / Other Elevate legs to the level of the heart or above for 30 minutes daily and/or when sitting for 3-4 times a day throughout the day. Avoid standing for long periods of time. Off-Loading Open toe surgical shoe to: Wound Treatment Wound #4 - Amputation Site - Transmetatarsal Wound Laterality: Right Prim Dressing: Maxorb Extra Ag+ Alginate Dressing, 2x2 (in/in) 1 x Per Day/30 Days ary Discharge Instructions: Apply to wound bed as instructed Secondary Dressing: ABD Pad, 8x10 1 x Per Day/30 Days Discharge Instructions: Apply over primary dressing as directed. Secured With: [employer], 4.5x3.1 (in/yd) 1 x Per Day/30 Days Discharge Instructions: Secure with Kerlix as  directed. Secured With: 12M Medipore H Soft Cloth Surgical T ape, 4 x 10 (in/yd) 1 x Per Day/30 Days Discharge Instructions: Secure with tape as directed. Secured With: ace wrap  1 x Per Day/30 Days Discharge Instructions: apply lightly to hold dressing in place. Electronic Signature(s) Signed: 09/26/2022 3:50:21 PM By: Geralyn Corwin DO Previous Signature: 09/26/2022 1:10:17 PM Version By: Karie Schwalbe RN Entered By: Geralyn Corwin on 09/26/2022 14:19:33 -------------------------------------------------------------------------------- Problem List Details Patient Name: Date of Service: [name] V IS, MA [name]. 09/26/2022 12:30 PM Medical Record Number: [id number] Patient Account Number: [id number] Date of Birth/Sex: Treating RN: 11-05-1980 (42 y.o. F) Primary Care Provider: Gwinda Passe Other Clinician: Referring Provider: Treating Provider/Extender: Grace Isaac in Treatment: 7 Active Problems ICD-10 Encounter Code Description Active Date MDM Diagnosis L97.512 Non-pressure chronic ulcer of other part of right foot with fat layer exposed 08/08/2022 No Yes E11.621 Type 2 diabetes mellitus with foot ulcer 08/08/2022 No Yes M86.671 Other chronic osteomyelitis, right ankle and foot 08/08/2022 No Yes Inactive Problems Resolved Problems [name], [name] ([id number]) [id number] Page 6 of 11 Electronic Signature(s) Signed: 09/26/2022 3:50:21 PM By: Geralyn Corwin DO Entered By: Geralyn Corwin on 09/26/2022 14:17:29 -------------------------------------------------------------------------------- Progress Note Details Patient Name: Date of Service: [name] V IS, MA [name]. 09/26/2022 12:30 PM Medical Record Number: [id number] Patient Account Number: [id number] Date of Birth/Sex: Treating RN: August 28, 1980 (42 y.o. F) Primary Care Provider: Gwinda Passe Other Clinician: Referring Provider: Treating Provider/Extender: Grace Isaac in Treatment: 7 Subjective Chief Complaint Information obtained from Patient 01/22/2021; Osteomyelitis of the right foot status post transmetatarsal amputation with surgical site dehiscence 08/08/2022; reopening of wound to the right foot with a history of transmetatarsal amputation with previous difficulty healing surgical site History of Present Illness (HPI) Admission 01/22/2021 Ms. Caselynn Javaid [name] a 42 year old female with a past medical history of insulin-dependent uncontrolled type 2 diabetes with last hemoglobin A1c of 13.5, osteomyelitis of the right foot status post transmetatarsal amputation on 12/18/2020 that presents to the clinic for right foot wound. She has had dehiscence of the surgical site. [name] using wet-to-dry dressings. She has a PICC line and receiving IV ceftriaxone daily for her osteomyelitis. There [name] an end date of 01/27/2021. [name] also taking oral metronidazole. She currently denies systemic signs of infection. 1/19; patient presents for follow-up. She was diagnosed with a DVT to the right lower extremity 2 days ago. [name] on Eliquis now. [name] scheduled to see her infectious disease doctor tomorrow. She has been using Dakin's wet-to-dry dressings. She denies systemic signs of infection. 1/26; patient presents for follow-up. She saw infectious disease on 1/21 started on Augmentin. Her PICC line and IV ceftriaxone was discontinued. Patient reports stability to her wound. She has been using Dakin's wet-to-dry dressings. She currently denies systemic signs of infection. 2/3; patient presents for follow-up. She continues to use Dakin's wet-to-dry dressings to the wound bed. She saw Dr. Manson Passey with infectious disease yesterday and [name] continuing Augmentin. T entative end date [name] 2/16. Patient reports following up with orthopedics. She states there [name] no further plan from them. She currently denies systemic signs of infection. 2/10;  patient presents for follow-up. She continues to use Dakin's wet to dry dressings. [name] scheduled to have her MRI done on 2/14. She states that she had pain to the debridement site from last clinic visit and declines debridement today. She denies systemic signs of infection. She continues to have yellow thick drainage. 2/20; patient presents for follow-up. She continues to use Dakin's wet-to-dry dressings. She obtained her MRI. The results showed an abscess and [name] scheduled to see her orthopedic surgeon on  2/23. She saw infectious disease 2/17 and her antibiotics were extended. She currently denies systemic signs of infection. 3/6; patient presents for follow-up. She had debridement and irrigation of her foot on 03/10/2021 due to abscess noted on MRI. She was started on IV ceftriaxone and oral Flagyl. She has no issues or complaints today. She has been using iodoform packing to the tunnel and Dakin's wet-to-dry to the opening. 03/26/2021: She continues on IV ceftriaxone and oral metronidazole. She has follow-up with infectious disease tomorrow. No significant issues or complaints today. Her mother continues to help her with her wound dressing, using iodoform packing strips into the tunnel and Dakin's to the open portion of the wound. 3/20; patient presents for follow-up. She continues to be on IV ceftriaxone in oral metronidazole. She has been using iodoform to the tunnel and Dakin's wet-to- dry to the open wound. She denies signs of infection. 3/27; patient presents for follow-up. She no longer has a PICC line. She has been using iodoform to the tunnel and Dakin's wet-to-dry to the open wound. She reports improvement in wound healing. She denies signs of infection. 4/3; patient presents for follow-up. She states she has been using Hydrofera Blue to the open wound and iodoform packing to the tunnel without any issues. She denies signs of infection. 4/18; patient presents for follow-up. She saw  infectious disease on 4/11. She has finished her oral antibiotics and completed a total of 6 weeks of antibiotics (this includes IV as well). No further antibiotics needed. She has been using Hydrofera Blue and iodoform packing. She states that the tunneled area has come in and the iodoform [name] not staying in place anymore. She has no issues or complaints today. She denies signs of infection. 4/24; patient presents for follow-up. She saw Dr. Carlene Coria, plastic surgery to discuss potential skin graft/substitute placement. At this time he thinks that the skin graft would likely not take. He [name] in agreement with trying a wound VAC. Patient has been using Hydrofera Blue dressing changes with no issues. She denies signs of infection. She reports improvement in wound healing. 5/1; patient presents for follow-up. Unfortunately patient did not have insurance when we ran for the pico. There [name] an assistance program and we are trying to get this accommodated for the patient. In the meantime she has been using Hydrofera Blue without any issues. She denies signs of infection. 5/8; patient presents for follow-up. We have not heard back if pico [name] covered by her insurance. She has been using collagen to the wound bed over the past week. She denies signs of infection. 5/18; patient presents for follow-up. She has been using collagen to the wound bed without issues. Again we have not heard if pico [name] covered by her insurance. [name], [name] ([id number]) [id number] Page 7 of 11 She has no issues or complaints today. 5/23; patient presents for follow-up. She has been using collagen to the wound bed. She has no issues or complaints today. She obtained the wound VAC from [facility] and brought this in today. She denies signs of infection. 6/1; patient presents for follow-up. She has been using the wound VAC for the past week. She has had this changed twice since she was last here. She reports more  maceration to the periwound. She denies signs of infection. 6/7; right TMA site. There are 2 wounds 0 separated by a bridge of healed tissue. The more lateral area has undermining. Both areas have healthy looking granulation at the base but relative the  size of the wound [name] fairly deep. There [name] no exposed bone no evidence of infection. Her wound VAC was put on hold last week because of surrounding skin maceration she has been using collagen this week. She has a modified shoe 6/12; patient presents for follow-up. Last week the wound VAC was reinitiated. She had no issues with the wound VAC itself. Today she has maceration again noted to the surrounding skin. She denies signs of infection. 6/27; patient presents for follow-up. She has been using Medihoney to the wound bed. We took a break from the wound VAC because the periwound was macerated. She still has some areas of maceration to the distal foot where there [name] a callus. She currently denies signs of infection. 7/11; patient presents for follow-up. She has been using Medihoney to the wound bed. She has developed some increased warmth and erythema to the lateral aspect of the right foot. She states this [name] occurred over the past week and there [name] increased pain. No drainage noted. 7/17; patient presents for follow-up. She has been using Medihoney to the wound bed. She completed her course of Bactrim. She reports improvement in symptoms. 7/24; Patient presents for follow up. She has been using Medihoney to the wound bed without issues. She completed another course of Bactrim. She reports improvement in her symptoms but still has some mild tenderness to the medial aspect of the foot. 7/31; Patient presents for follow-up. She has been using Medihoney and Dakin's to the wound bed. She denies signs of infection. 8/14; patient presents for follow-up. She has been using Dakin's wet-to-dry packing strips to the right medial aspect of the amputation site and  Medihoney to the anterior site. She has no issues or complaints today. She has started physical therapy. She denies signs of infection. 8/29; patient presents for follow-up. She has been using Dakin's wet-to-dry packing strips to the right medial aspect of the amputation site however this [name] becoming more difficult to place. She did report that she had increased redness and swelling to that site and developed some drainage. It has resolved. She continues with physical therapy. 9/8; patient presents for follow-up. We have been using silver alginate to the tunneled area. She completed her course of antibiotics. She reports no pain, increased swelling or erythema. 9/21; patient presents for follow-up. She has been using gentamicin to the tunneled area. She has no issues or complaints today. She denies signs of infection. 10/2; patient presents for follow-up. [name] scheduled to have her CT scan on 10/9. She currently denies signs of infection. She denies increased warmth, erythema or purulent drainage from the wound bed. She has been using Dakin's wet-to-dry dressings. 10/12; patient presents for follow-up. She had her CT scan on 10/9 that showed A small irregular rim-enhancing fluid pocket communicating to the overlying soft tissues of the sinus tract compatible with a small abscess. Currently she denies systemic signs of infection. She has been doing Dakin's wet-to-dry packing strips but it [name] hard for her to pack into the narrow opening. 10/30; patient presents for follow-up. Since last clinic visit she has had OR debridement of her Right foot by Dr. Odis Hollingshead Due to abscess noted on CT. She has been using iodoform packing. 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 She will complete 2 weeks of this and continue with oral amoxicillin for the next 6 to 12 months. She follows with Dr. Luciana Axe for this. She currently denies signs of infection. 11/13; patient  presents  for follow-up. Patient has been using silver alginate with gentamicin to the wound bed. She has no issues or complaints today she. She reports improvement in wound healing. 12/4; patient presents for follow-up. She has been using silver alginate and gentamicin to the wound bed. She has no issues or complaints today. 12/8; patient presents for follow-up. She has been using silver alginate with gentamicin to the wound bed. She presents for her first cast placement. She will be back early next week for her obligatory cast change. 12/12; patient presents for follow-up. We have been using collagen with antibiotic ointment to the wound bed under a total contact cast. She has tolerated this well. [name] improved in wound healing. 12/18; patient presents for follow-up. We have been using collagen with antibiotic ointment under the total contact cast. She has no complaints today. 1/2; The cast was placed at last clinic visit however patient missed her follow-up and this was taken off last week at a nurse visit. Patient has been using packing strips to the medial narrowed wound. She has noted no drainage. 1/22; patient presents for follow-up. She has been keeping the area covered. She reports no drainage. She has had no issues to the previous wound site. She denies any signs of infection including increased warmth, erythema or purulent drainage. 08/08/2022 [name] a 42 year old female with a past medical history of insulin-dependent uncontrolled type 2 diabetes with last hemoglobin A1c of 8.8, chronic osteomyelitis of the right foot status post transmetatarsal amputation on 12/18/2020 that has been previously treated for surgical dehiscence of this site in our clinic. She presents today 6 months after discharge from our clinic due to healed right foot wound with now reopening of the previous surgical site. She reports chronic pain to the site however denies purulent drainage, increased warmth or  erythema. She noticed the wound opening about 1 month ago. She has been doing physical therapy without significant issues. She has inserts to her shoe however she has been advised to follow-up with [facility] for new custom inserts by her orthopedic surgeon now that she has a new wound. She states she will try and do this. She has been using antibiotic ointment to the wound bed. 8/1; patient presents for follow-up. She has been taking her antibiotics without issues. She has been using Dakin's wet-to-dry dressings to the wound bed. The wound [name] smaller. She denies systemic signs of infection. 8/13; patient presents for follow-up. She had ABIs completed that were normal. On the right it was 1.02 and on the left was 0.99. She has been using silver alginate to the wound bed with gentamicin ointment. She states it [name] hard to pack the wound bed. She currently denies signs of infection. 8/29; patient presents for follow-up. She has been using collagen with antibiotic ointment to the wound bed. Wound [name] stable. She denies signs of infection. 9/5; patient presents for follow-up. She has been using silver alginate to the wound bed. Wound [name] slightly wider. She denies signs of infection. 9/12; patient presents for follow-up. She has been using silver alginate to the wound bed. Wound [name] slightly smaller today. [name], [name] ([id number]) [id number] Page 8 of 11 Patient History Information obtained from Patient. Family History Cancer - Paternal Grandparents, Diabetes - Mother, Hypertension - Mother, Stroke - Maternal Grandparents, No family history of Heart Disease, Hereditary Spherocytosis, Kidney Disease, Lung Disease, Seizures, Thyroid Problems, Tuberculosis. Social History Never smoker, Marital Status - Single, Alcohol Use - Rarely, Drug Use -  Prior History - Marijuana, Caffeine Use - Daily. Medical History Cardiovascular Patient has history of Deep Vein Thrombosis,  Hypertension Endocrine Patient has history of Type II Diabetes - 8.8 HgbA1c 5 months ago Musculoskeletal Patient has history of Osteomyelitis - Right Transmet 12/18/20 Neurologic Patient has history of Neuropathy Hospitalization/Surgery History - inpatient right foot abscess 10/12-10/18/2023. - 2022 right foot transmet. Objective Constitutional respirations regular, non-labored and within target range for patient.. Vitals Time Taken: 12:40 PM, Height: 69 in, Weight: 330 lbs, BMI: 48.7, Temperature: 98 F, Pulse: 82 bpm, Respiratory Rate: 16 breaths/min, Blood Pressure: 110/75 mmHg. Cardiovascular 2+ dorsalis pedis/posterior tibialis pulses. Psychiatric pleasant and cooperative. General Notes: T the right foot there [name] an open wound Along the transmetatarsal previous amputation site with nonviable tissue and postdebridement o granulation tissue present. Does not probe to bone. No purulent drainage or fluctuance noted. No increased warmth or erythema. minimal Maceration to the periwound. Integumentary (Hair, Skin) Wound #4 status [name] Open. Original cause of wound was Shear/Friction. The date acquired was: 07/08/2022. The wound has been in treatment 7 weeks. The wound [name] located on the Right Amputation Site - Transmetatarsal. The wound measures 0.5cm length x 1.2cm width x 0.3cm depth; 0.471cm^2 area and 0.141cm^3 volume. There [name] Fat Layer (Subcutaneous Tissue) exposed. There [name] a medium amount of serosanguineous drainage noted. The wound margin [name] distinct with the outline attached to the wound base. There [name] small (1-33%) pink granulation within the wound bed. There [name] a large (67-100%) amount of necrotic tissue within the wound bed including Eschar and Adherent Slough. The periwound skin appearance exhibited: Callus, Maceration. The periwound skin appearance did not exhibit: Crepitus, Excoriation, Induration, Rash, Scarring, Dry/Scaly, Atrophie Blanche, Cyanosis, Ecchymosis, Hemosiderin  Staining, Mottled, Pallor, Rubor, Erythema. The periwound has tenderness on palpation. Assessment Active Problems ICD-10 Non-pressure chronic ulcer of other part of right foot with fat layer exposed Type 2 diabetes mellitus with foot ulcer Other chronic osteomyelitis, right ankle and foot Patient's wound [name] smaller today. I debrided nonviable tissue. I recommended continuing the course with silver alginate and offloading with surgical shoe. Follow-up in 1 week. Still awaiting approval for skin substitute by insurance. Procedures Wound #4 Pre-procedure diagnosis of Wound #4 [name] a Diabetic Wound/Ulcer of the Lower Extremity located on the Right Amputation Site - Transmetatarsal .Severity of [name], [name] ([id number]) [phone].pdf Page 9 of 11 Tissue Pre Debridement [name]: Fat layer exposed. There was a Excisional Skin/Subcutaneous Tissue Debridement with a total area of 0.47 sq cm performed by Geralyn Corwin, DO. With the following instrument(s): Curette to remove Viable and Non-Viable tissue/material. Material removed includes Callus, Subcutaneous Tissue, and Slough after achieving pain control using Lidocaine 4% T opical Solution. No specimens were taken. A time out was conducted at 12:59, prior to the start of the procedure. A Minimum amount of bleeding was controlled with Pressure. The procedure was tolerated well with a pain level of 0 throughout and a pain level of 0 following the procedure. Post Debridement Measurements: 0.5cm length x 1.2cm width x 0.3cm depth; 0.141cm^3 volume. Character of Wound/Ulcer Post Debridement [name] improved. Severity of Tissue Post Debridement [name]: Fat layer exposed. Post procedure Diagnosis Wound #4: Same as Pre-Procedure Plan Follow-up Appointments: Return Appointment in 1 week. - Dr. Leanord Hawking 10/03/22 at 12:30pm room 9 Other: - -Already DONE Vascular and vein appointment 08/21/22. T oral antibiotics call [facility] to get shoe  adjusted. [phone] [facility], [place]-DONE Anesthetic: (In clinic) Topical Lidocaine 4% applied to wound bed  Cellular or Tissue Based Products: Wound #4 Right Amputation Site - Transmetatarsal: Cellular or Tissue Based Product Type: - RUN IVR for APLIGRAF (09/12/22)-SENT NOTES Bathing/ Shower/ Hygiene: May shower with protection but do not get wound dressing(s) wet. Protect dressing(s) with water repellant cover (for example, large plastic bag) or a cast cover and may then take shower. Edema Control - Lymphedema / SCD / Other: Elevate legs to the level of the heart or above for 30 minutes daily and/or when sitting for 3-4 times a day throughout the day. Avoid standing for long periods of time. Off-Loading: Open toe surgical shoe to: WOUND #4: - Amputation Site - Transmetatarsal Wound Laterality: Right Prim Dressing: Maxorb Extra Ag+ Alginate Dressing, 2x2 (in/in) 1 x Per Day/30 Days ary Discharge Instructions: Apply to wound bed as instructed Secondary Dressing: ABD Pad, 8x10 1 x Per Day/30 Days Discharge Instructions: Apply over primary dressing as directed. Secured With: [employer], 4.5x3.1 (in/yd) 1 x Per Day/30 Days Discharge Instructions: Secure with Kerlix as directed. Secured With: 36M Medipore H Soft Cloth Surgical T ape, 4 x 10 (in/yd) 1 x Per Day/30 Days Discharge Instructions: Secure with tape as directed. Secured With: ace wrap 1 x Per Day/30 Days Discharge Instructions: apply lightly to hold dressing in place. 1. In office sharp debridement 2. Silver alginate 3. Aggressive offloading 4. Follow-up in 1 week Electronic Signature(s) Signed: 09/26/2022 3:50:21 PM By: Geralyn Corwin DO Entered By: Geralyn Corwin on 09/26/2022 14:21:41 -------------------------------------------------------------------------------- HxROS Details Patient Name: Date of Service: [name] V IS, MA [name]. 09/26/2022 12:30 PM Medical Record Number: [id number] Patient  Account Number: [id number] Date of Birth/Sex: Treating RN: 06/14/1980 (42 y.o. F) Primary Care Provider: Gwinda Passe Other Clinician: Referring Provider: Treating Provider/Extender: Grace Isaac in Treatment: 7 Information Obtained From Patient Cardiovascular Medical History: Positive for: Deep Vein Thrombosis; Hypertension Endocrine ESPARANZA, [name] ([id number]) [id number] Page 10 of 11 Medical History: Positive for: Type II Diabetes - 8.8 HgbA1c 5 months ago Time with diabetes: Dx 2009 Treated with: Insulin, Oral agents Blood sugar tested every day: Yes Tested : daily Musculoskeletal Medical History: Positive for: Osteomyelitis - Right Transmet 12/18/20 Neurologic Medical History: Positive for: Neuropathy Immunizations Pneumococcal Vaccine: Received Pneumococcal Vaccination: No Implantable Devices Yes Hospitalization / Surgery History Type of Hospitalization/Surgery inpatient right foot abscess 10/12-10/18/2023 2022 right foot transmet Family and Social History Cancer: Yes - Paternal Grandparents; Diabetes: Yes - Mother; Heart Disease: No; Hereditary Spherocytosis: No; Hypertension: Yes - Mother; Kidney Disease: No; Lung Disease: No; Seizures: No; Stroke: Yes - Maternal Grandparents; Thyroid Problems: No; Tuberculosis: No; Never smoker; Marital Status - Single; Alcohol Use: Rarely; Drug Use: Prior History - Marijuana; Caffeine Use: Daily; Financial Concerns: No; Food, Clothing or Shelter Needs: No; Support System Lacking: No; Transportation Concerns: No Electronic Signature(s) Signed: 09/26/2022 3:50:21 PM By: Geralyn Corwin DO Entered By: Geralyn Corwin on 09/26/2022 14:18:42 -------------------------------------------------------------------------------- SuperBill Details Patient Name: Date of Service: [name] V IS, MA [name]. 09/26/2022 Medical Record Number: [id number] Patient Account Number: [id number] Date  of Birth/Sex: Treating RN: Aug 02, 1980 (42 y.o. F) Primary Care Provider: Gwinda Passe Other Clinician: Referring Provider: Treating Provider/Extender: Grace Isaac in Treatment: 7 Diagnosis Coding ICD-10 Codes Code Description [phone] Non-pressure chronic ulcer of other part of right foot with fat layer exposed E11.621 Type 2 diabetes mellitus with foot ulcer M86.671 Other chronic osteomyelitis, right ankle and foot Facility Procedures Physician Procedures : CPT4 Code Description Modifier [id number] - WC PHYS SUBQ TISS 20  SQ CM ICD-10 Diagnosis Description L97.512 Non-pressure chronic ulcer of other part of right foot with fat layer exposed E11.621 Type 2 diabetes mellitus with foot ulcer Quantity: 1 Electronic Signature(s) Signed: 09/26/2022 3:50:21 PM By: Geralyn Corwin DO Entered By: Geralyn Corwin on 09/26/2022 14:21:51

## 2022-10-03 ENCOUNTER — Encounter (HOSPITAL_BASED_OUTPATIENT_CLINIC_OR_DEPARTMENT_OTHER): Payer: Medicaid Other | Admitting: Internal Medicine

## 2022-10-03 DIAGNOSIS — E11621 Type 2 diabetes mellitus with foot ulcer: Secondary | ICD-10-CM | POA: Diagnosis not present

## 2022-10-03 NOTE — Progress Notes (Addendum)
[name] ([id number]) [id number] Page 1 of 10 Visit Report for 10/03/2022 Arrival Information Details Patient Name: Date of Service: [name] [name], [place] [name]. 10/03/2022 12:30 PM Medical Record Number: [id number] Patient Account Number: [id number] Date of Birth/Sex: Treating RN: 16-Aug-1980 (42 y.o. [name] Primary Care [name]: [name] Other Clinician: Referring [name]: Treating [name]/Extender: [name] in Treatment: 8 Visit Information History Since Last Visit Added or deleted any medications: No Patient Arrived: Wheel Chair Any new allergies or adverse reactions: No Arrival Time: 12:38 Had a fall or experienced change in No Accompanied By: self activities of daily living that may affect Transfer Assistance: None risk of falls: Patient Identification Verified: Yes Signs or symptoms of abuse/neglect since last visito No Patient Requires Transmission-Based Precautions: No Hospitalized since last visit: No Patient Has Alerts: Yes Implantable device outside of the clinic excluding No Patient Alerts: Patient on Blood Thinner cellular tissue based products placed in the center Eliquis since last visit: ABI R 1.02 (08/21/22) Has Dressing in Place as Prescribed: Yes ABI [name] 0.99 (08/21/22) Pain Present Now: Yes Electronic Signature(s) Signed: 10/03/2022 1:37:32 PM By: Karie Schwalbe RN Entered By: Karie [name] on 10/03/2022 09:51:01 -------------------------------------------------------------------------------- Clinic Level of Care Assessment Details Patient Name: Date of Service: [name] IS, MA [name]. 10/03/2022 12:30 PM Medical Record Number: [id number] Patient Account Number: [id number] Date of Birth/Sex: Treating RN: 11-06-1980 (42 y.o. F) Primary Care Karo Rog: [name] Other Clinician: Referring [name]: Treating [name] in Treatment:  8 Clinic Level of Care Assessment Items TOOL 4 Quantity Score []  - 0 Use when only an EandM [name] ASSESSMENTS - Nursing Assessment / Reassessment X- 1 10 Reassessment of Co-morbidities (includes updates in patient status) X- 1 5 Reassessment of Adherence to Treatment Plan ASSESSMENTS - Wound and Skin A ssessment / Reassessment X - Simple Wound Assessment / Reassessment - one wound 1 5 []  - 0 Complex Wound Assessment / Reassessment - multiple wounds []  - 0 Dermatologic / Skin Assessment (not related to wound area) ASSESSMENTS - Focused Assessment []  - 0 Circumferential Edema Measurements - multi extremities []  - 0 Nutritional Assessment / Counseling / Intervention [name], [name] ([id number]) [phone].pdf Page 2 of 10 []  - 0 Lower Extremity Assessment (monofilament, tuning fork, pulses) []  - 0 Peripheral Arterial Disease Assessment (using hand held doppler) ASSESSMENTS - Ostomy and/or Continence Assessment and Care []  - 0 Incontinence Assessment and Management []  - 0 Ostomy Care Assessment and Management (repouching, etc.) PROCESS - Coordination of Care X - Simple Patient / Family Education for ongoing care 1 15 []  - 0 Complex (extensive) Patient / Family Education for ongoing care X- 1 10 Staff obtains Chiropractor, Records, T Results / Process Orders est []  - 0 Staff telephones HHA, Nursing Homes / Clarify orders / etc []  - 0 Routine Transfer to another Facility (non-emergent condition) []  - 0 Routine Hospital Admission (non-emergent condition) []  - 0 New Admissions / Manufacturing engineer / Ordering NPWT Apligraf, etc. , []  - 0 Emergency Hospital Admission (emergent condition) []  - 0 Simple Discharge Coordination []  - 0 Complex (extensive) Discharge Coordination PROCESS - Special Needs []  - 0 Pediatric / Minor Patient Management []  - 0 Isolation Patient Management []  - 0 Hearing / Language / Visual special  needs []  - 0 Assessment of Community assistance (transportation, D/C planning, etc.) []  - 0 Additional assistance / Altered mentation []  - 0 Support Surface(s) Assessment (bed, cushion, seat,  etc.) INTERVENTIONS - Wound Cleansing / Measurement X - Simple Wound Cleansing - one wound 1 5 []  - 0 Complex Wound Cleansing - multiple wounds X- 1 5 Wound Imaging (photographs - any number of wounds) []  - 0 Wound Tracing (instead of photographs) X- 1 5 Simple Wound Measurement - one wound []  - 0 Complex Wound Measurement - multiple wounds INTERVENTIONS - Wound Dressings X - Small Wound Dressing one or multiple wounds 1 10 []  - 0 Medium Wound Dressing one or multiple wounds []  - 0 Large Wound Dressing one or multiple wounds []  - 0 Application of Medications - topical []  - 0 Application of Medications - injection INTERVENTIONS - Miscellaneous []  - 0 External ear exam []  - 0 Specimen Collection (cultures, biopsies, blood, body fluids, etc.) []  - 0 Specimen(s) / Culture(s) sent or taken to Lab for analysis []  - 0 Patient Transfer (multiple staff / Nurse, adult / Similar devices) []  - 0 Simple Staple / Suture removal (25 or less) []  - 0 Complex Staple / Suture removal (26 or more) []  - 0 Hypo / Hyperglycemic Management (close monitor of Blood Glucose) [name], [name] ([id number]) [id number] Page 3 of 10 []  - 0 Ankle / Brachial Index (ABI) - do not check if billed separately X- 1 5 Vital Signs Has the patient been seen at the hospital within the last three years: Yes Total Score: 75 Level Of Care: New/Established - Level 2 Electronic Signature(s) Signed: 11/13/2022 3:29:29 PM By: [name] Entered By: [name] on 10/15/2022 08:52:07 -------------------------------------------------------------------------------- Encounter Discharge Information Details Patient Name: Date of Service: [name] V IS, MA [name]. 10/03/2022 12:30 PM Medical Record Number:  [id number] Patient Account Number: [id number] Date of Birth/Sex: Treating RN: August 21, 1980 (42 y.o. [name] Primary Care [name]: [name] Other Clinician: Referring [name]: Treating Jakhi Dishman/Extender: [name] in Treatment: 8 Encounter Discharge Information Items Discharge Condition: Stable Ambulatory Status: Wheelchair Discharge Destination: Home Transportation: Private Auto Accompanied By: self Schedule Follow-up Appointment: Yes Clinical Summary of Care: Patient Declined Electronic Signature(s) Signed: 10/03/2022 1:37:32 PM By: Karie Schwalbe RN Entered By: Karie [name] on 10/03/2022 10:37:01 -------------------------------------------------------------------------------- Lower Extremity Assessment Details Patient Name: Date of Service: [name] V IS, MA [name]. 10/03/2022 12:30 PM Medical Record Number: [id number] Patient Account Number: [id number] Date of Birth/Sex: Treating RN: 01/21/1980 (42 y.o. [name] Primary Care Javona Bergevin: [name] Other Clinician: Referring Jeovanni Heuring: Treating Kyleigha Markert/Extender: Tobie Poet Weeks in Treatment: 8 Edema Assessment Assessed: [Left: No] [Right: No] Edema: [Left: Ye] [Right: s] Calf Left: Right: Point of Measurement: 31 cm From Medial Instep 52 cm Ankle Left: Right: Point of Measurement: 9 cm From Medial Instep 27.2 cm Vascular Assessment Galluzzo, Nyiah [name] ([id number]) [Right:[id number] Page 4 of 10] Pulses: Dorsalis Pedis Palpable: [Right:Yes] Extremity colors, hair growth, and conditions: Extremity Color: [Right:Hyperpigmented] Hair Growth on Extremity: [Right:No] Temperature of Extremity: [Right:Warm] Capillary Refill: [Right:< 3 seconds] Dependent Rubor: [Right:No Yes] Electronic Signature(s) Signed: 10/03/2022 1:37:32 PM By: Karie Schwalbe RN Entered By: Karie [name] on 10/03/2022  09:52:27 -------------------------------------------------------------------------------- Multi Wound Chart Details Patient Name: Date of Service: [name] V IS, MA [name]. 10/03/2022 12:30 PM Medical Record Number: [id number] Patient Account Number: [id number] Date of Birth/Sex: Treating RN: 1980/04/11 (42 y.o. F) Primary Care Colbe Viviano: [name] Other Clinician: Referring Dahir Ayer: Treating Junette Bernat/Extender: [name] in Treatment: 8 Vital Signs Height(in): 69 Pulse(bpm): 74 Weight(lbs): 330 Blood Pressure(mmHg): 126/83 Body Mass Index(BMI): 48.7 Temperature(F): 98.3 Respiratory Rate(breaths/min): 18 [4:Photos:] [N/A:N/A]  Right Amputation Site - N/A N/A Wound Location: Transmetatarsal Shear/Friction N/A N/A Wounding Event: Diabetic Wound/Ulcer of the Lower N/A N/A Primary Etiology: Extremity Dehisced Wound N/A N/A Secondary Etiology: Deep Vein Thrombosis, Hypertension, N/A N/A Comorbid History: Type II Diabetes, Osteomyelitis, Neuropathy 07/08/2022 N/A N/A Date Acquired: 8 N/A N/A Weeks of Treatment: Open N/A N/A Wound Status: No N/A N/A Wound Recurrence: 0.5x1.1x0.4 N/A N/A Measurements [name] x W x D (cm) 0.432 N/A N/A A (cm) : rea 0.173 N/A N/A Volume (cm) : -96.40% N/A N/A % Reduction in A rea: 1.70% N/A N/A % Reduction in Volume: 6 Starting Position 1 (o'clock): 7 Ending Position 1 (o'clock): 0.3 Maximum Distance 1 (cm): Yes N/A N/A Undermining: Grade 3 N/A N/A Classification: Medium N/A N/A Exudate A mount: Serosanguineous N/A N/A Exudate Type: red, brown N/A N/A Exudate Color: Distinct, outline attached N/A N/A Wound Margin: Thursby, Mirtie [name] ([id number]) [id number] Page 5 of 10 Small (1-33%) N/A N/A Granulation Amount: Pink N/A N/A Granulation Quality: Large (67-100%) N/A N/A Necrotic Amount: Eschar, Adherent Slough N/A N/A Necrotic Tissue: Fat Layer (Subcutaneous Tissue): Yes  N/A N/A Exposed Structures: Fascia: No Tendon: No Muscle: No Joint: No Bone: No Small (1-33%) N/A N/A Epithelialization: Debridement - Selective/Open Wound N/A N/A Debridement: Pre-procedure Verification/Time Out 12:58 N/A N/A Taken: Lidocaine 4% T opical Solution N/A N/A Pain Control: Callus, Slough N/A N/A Tissue Debrided: Non-Viable Tissue N/A N/A Level: 0.43 N/A N/A Debridement A (sq cm): rea Curette N/A N/A Instrument: Minimum N/A N/A Bleeding: Pressure N/A N/A Hemostasis A chieved: 0 N/A N/A Procedural Pain: 0 N/A N/A Post Procedural Pain: Procedure was tolerated well N/A N/A Debridement Treatment Response: 0.5x1.1x0.4 N/A N/A Post Debridement Measurements [name] x W x D (cm) 0.173 N/A N/A Post Debridement Volume: (cm) Callus: Yes N/A N/A Periwound Skin Texture: Excoriation: No Induration: No Crepitus: No Rash: No Scarring: No Maceration: Yes N/A N/A Periwound Skin Moisture: Dry/Scaly: No Atrophie Blanche: No N/A N/A Periwound Skin Color: Cyanosis: No Ecchymosis: No Erythema: No Hemosiderin Staining: No Mottled: No Pallor: No Rubor: No Yes N/A N/A Tenderness on Palpation: Debridement N/A N/A Procedures Performed: Treatment Notes Wound #4 (Amputation Site - Transmetatarsal) Wound Laterality: Right Cleanser Peri-Wound Care Topical Primary Dressing Maxorb Extra Ag+ Alginate Dressing, 2x2 (in/in) Discharge Instruction: Apply to wound bed as instructed Secondary Dressing ABD Pad, 8x10 Discharge Instruction: Apply over primary dressing as directed. Secured With [employer], 4.5x3.1 (in/yd) Discharge Instruction: Secure with Kerlix as directed. 33M Medipore H Soft Cloth Surgical T ape, 4 x 10 (in/yd) Discharge Instruction: Secure with tape as directed. ace wrap Discharge Instruction: apply lightly to hold dressing in place. Compression Wrap Compression Stockings Add-Ons Electronic Signature(s) Signed: 10/03/2022 4:42:34 PM By:  Baltazar Najjar MD [name], [name] ([id number]) [phone].pdf Page 6 of 10 Entered By: Baltazar Najjar on 10/03/2022 10:35:15 -------------------------------------------------------------------------------- Multi-Disciplinary Care Plan Details Patient Name: Date of Service: [name] [name], [place] [name]. 10/03/2022 12:30 PM Medical Record Number: [id number] Patient Account Number: [id number] Date of Birth/Sex: Treating RN: [birth year] (42 y.o. [name] Primary Care Aleeza Bellville: [name] Other Clinician: Referring Kajol Crispen: Treating Rocklyn Mayberry/Extender: [name] in Treatment: 8 Active Inactive Necrotic Tissue Nursing Diagnoses: Knowledge deficit related to management of necrotic/devitalized tissue Goals: Necrotic/devitalized tissue will be minimized in the wound bed Date Initiated: 08/08/2022 Target Resolution Date: 01/13/2023 Goal Status: Active Patient/caregiver will verbalize understanding of reason and process for debridement of necrotic tissue Date Initiated: 08/08/2022 Target Resolution Date: 01/13/2023 Goal Status: Active Interventions: Assess  patient pain level pre-, during and post procedure and prior to discharge Provide education on necrotic tissue and debridement process Notes: Nutrition Nursing Diagnoses: Impaired glucose control: actual or potential Goals: Patient/caregiver agrees to and verbalizes understanding of need to obtain nutritional consultation Date Initiated: 08/08/2022 Target Resolution Date: 01/13/2023 Goal Status: Active Patient/caregiver will maintain therapeutic glucose control Date Initiated: 08/08/2022 Target Resolution Date: 01/13/2023 Goal Status: Active Interventions: Assess HgA1c results as ordered upon admission and as needed Provide education on elevated blood sugars and impact on wound healing Provide education on nutrition Treatment Activities: Obtain HgA1c : 08/08/2022 Patient  referred to Primary Care Physician for further nutritional evaluation : 08/08/2022 Notes: Orientation to the Wound Care Program Nursing Diagnoses: Knowledge deficit related to the wound healing center program Goals: Patient/caregiver will verbalize understanding of the Wound Healing Center Program Date Initiated: 08/08/2022 Target Resolution Date: 01/13/2023 Goal Status: Active Interventions: Provide education on orientation to the wound center [name], [name] ([id number]) [phone].pdf Page 7 of 10 Notes: Pain, Acute or Chronic Nursing Diagnoses: Pain, acute or chronic: actual or potential Potential alteration in comfort, pain Goals: Patient will verbalize adequate pain control and receive pain control interventions during procedures as needed Date Initiated: 08/08/2022 Target Resolution Date: 01/13/2023 Goal Status: Active Patient/caregiver will verbalize comfort level met Date Initiated: 08/08/2022 Target Resolution Date: 01/13/2023 Goal Status: Active Interventions: Complete pain assessment as per visit requirements Encourage patient to take pain medications as prescribed Provide education on pain management Treatment Activities: Administer pain control measures as ordered : 08/08/2022 Notes: Wound/Skin Impairment Nursing Diagnoses: Knowledge deficit related to ulceration/compromised skin integrity Goals: Patient/caregiver will verbalize understanding of skin care regimen Date Initiated: 08/08/2022 Target Resolution Date: 01/13/2023 Goal Status: Active Ulcer/skin breakdown will heal within 14 weeks Date Initiated: 08/08/2022 Target Resolution Date: 01/13/2023 Goal Status: Active Interventions: Assess patient/caregiver ability to perform ulcer/skin care regimen upon admission and as needed Assess ulceration(s) every visit Provide education on ulcer and skin care Treatment Activities: Skin care regimen initiated : 08/08/2022 Topical wound  management initiated : 08/08/2022 Notes: Electronic Signature(s) Signed: 10/03/2022 1:37:32 PM By: Karie Schwalbe RN Entered By: Karie [name] on 10/03/2022 10:09:36 -------------------------------------------------------------------------------- Pain Assessment Details Patient Name: Date of Service: [name] IS, MA [name]. 10/03/2022 12:30 PM Medical Record Number: [id number] Patient Account Number: [id number] Date of Birth/Sex: Treating RN: [birth year] (42 y.o. [name] Primary Care Yesennia Hirota: [name] Other Clinician: Referring Ewin Rehberg: Treating Nicolaus Andel/Extender: [name] in Treatment: 8 Active Problems Location of Pain Severity and Description of Pain Patient Has [name], [name] ([id number]) [id number] Page 8 of 10 Patient Has Paino Yes Site Locations Pain Location: Pain in Ulcers With Dressing Change: No Duration of the Pain. Constant / Intermittento Constant Rate the pain. Current Pain Level: 3 Worst Pain Level: 10 Least Pain Level: 3 Tolerable Pain Level: 5 Character of Pain Describe the Pain: Aching, Tender Pain Management and Medication Current Pain Management: Medication: Yes Cold Application: No Rest: Yes Massage: No Activity: No T.E.N.S.: No Heat Application: No Leg drop or elevation: No [name] the Current Pain Management Adequate: Adequate How does your wound impact your activities of daily livingo Sleep: No Bathing: No Appetite: No Relationship With Others: No Bladder Continence: No Emotions: No Bowel Continence: No Work: No Toileting: No Drive: No Dressing: No Hobbies: No Electronic Signature(s) Signed: 10/03/2022 1:37:32 PM By: Karie Schwalbe RN Entered By: Karie [name] on 10/03/2022 09:52:13 -------------------------------------------------------------------------------- Patient/Caregiver Education Details Patient Name: Date of Service: [name] V IS,  MA [name].  9/19/2024andnbsp12:30 PM Medical Record Number: [id number] Patient Account Number: [id number] Date of Birth/Gender: Treating RN: 15-Jun-1980 (42 y.o. [name] Primary Care Physician: [name] Other Clinician: Referring Physician: Treating Physician/Extender: [name] in Treatment: 8 Education Assessment Education Provided To: Patient Education Topics Provided Wound/Skin Impairment: Methods: Demonstration, Explain/Verbal Responses: State content correctly Electronic Signature(s) [name], [name] ([id number]) [phone].pdf Page 9 of 10 Signed: 10/03/2022 1:37:32 PM By: Karie Schwalbe RN Entered By: Karie [name] on 10/03/2022 10:09:54 -------------------------------------------------------------------------------- Wound Assessment Details Patient Name: Date of Service: [name] V IS, MA [name]. 10/03/2022 12:30 PM Medical Record Number: [id number] Patient Account Number: [id number] Date of Birth/Sex: Treating RN: 22-Jun-1980 (42 y.o. [name] Primary Care Philipe Laswell: [name] Other Clinician: Referring Julyan Gales: Treating Juanita Devincent/Extender: Tobie Poet Weeks in Treatment: 8 Wound Status Wound Number: 4 Primary Diabetic Wound/Ulcer of the Lower Extremity Etiology: Wound Location: Right Amputation Site - Transmetatarsal Secondary Dehisced Wound Wounding Event: Shear/Friction Etiology: Date Acquired: 07/08/2022 Wound Status: Open Weeks Of Treatment: 8 Comorbid Deep Vein Thrombosis, Hypertension, Type II Diabetes, Clustered Wound: No History: Osteomyelitis, Neuropathy Photos Wound Measurements Length: (cm) 0.5 Width: (cm) 1.1 Depth: (cm) 0.4 Area: (cm) 0.432 Volume: (cm) 0.173 % Reduction in Area: -96.4% % Reduction in Volume: 1.7% Epithelialization: Small (1-33%) Undermining: Yes Starting Position (o'clock): 6 Ending Position (o'clock): 7 Maximum Distance: (cm)  0.3 Wound Description Classification: Grade 3 Wound Margin: Distinct, outline attached Exudate Amount: Medium Exudate Type: Serosanguineous Exudate Color: red, brown Foul Odor After Cleansing: No Slough/Fibrino Yes Wound Bed Granulation Amount: Small (1-33%) Exposed Structure Granulation Quality: Pink Fascia Exposed: No Necrotic Amount: Large (67-100%) Fat Layer (Subcutaneous Tissue) Exposed: Yes Necrotic Quality: Eschar, Adherent Slough Tendon Exposed: No Muscle Exposed: No Joint Exposed: No Bone Exposed: No Periwound Skin Texture Texture Color No Abnormalities Noted: No No Abnormalities Noted: No Callus: Yes Atrophie Blanche: No Crepitus: No Cyanosis: No [name], [name] ([id number]) [id number] Page 10 of 10 Excoriation: No Ecchymosis: No Induration: No Erythema: No Rash: No Hemosiderin Staining: No Scarring: No Mottled: No Pallor: No Moisture Rubor: No No Abnormalities Noted: No Dry / Scaly: No Temperature / Pain Maceration: Yes Tenderness on Palpation: Yes Electronic Signature(s) Signed: 10/03/2022 1:37:32 PM By: Karie Schwalbe RN Entered By: Karie [name] on 10/03/2022 09:55:34 -------------------------------------------------------------------------------- Vitals Details Patient Name: Date of Service: [name] V IS, MA [name]. 10/03/2022 12:30 PM Medical Record Number: [id number] Patient Account Number: [id number] Date of Birth/Sex: Treating RN: 1980/09/28 (42 y.o. [name] Primary Care Aleksa Catterton: [name] Other Clinician: Referring Camrin Gearheart: Treating Deann Mclaine/Extender: [name] in Treatment: 8 Vital Signs Time Taken: 12:38 Temperature (F): 98.3 Height (in): 69 Pulse (bpm): 74 Weight (lbs): 330 Respiratory Rate (breaths/min): 18 Body Mass Index (BMI): 48.7 Blood Pressure (mmHg): 126/83 Reference Range: 80 - 120 mg / dl Electronic Signature(s) Signed: 10/03/2022 1:37:32 PM By:  Karie Schwalbe RN Entered By: Karie [name] on 10/03/2022 09:51:27

## 2022-10-10 ENCOUNTER — Encounter (HOSPITAL_BASED_OUTPATIENT_CLINIC_OR_DEPARTMENT_OTHER): Payer: Medicaid Other | Admitting: Internal Medicine

## 2022-10-10 DIAGNOSIS — E11621 Type 2 diabetes mellitus with foot ulcer: Secondary | ICD-10-CM | POA: Diagnosis not present

## 2022-10-11 NOTE — Progress Notes (Signed)
[name], [name] ([id number]) [id number] Page 1 of 9 Visit Report for 10/10/2022 Debridement Details Patient Name: Date of Service: [name] [name], [place] [name] 10/10/2022 12:30 PM Medical Record Number: [id number] Patient Account Number: [id number] Date of Birth/Sex: Treating RN: 10/08/1980 (42 y.o. F) Primary Care Provider: Gwinda Passe Other Clinician: Referring Provider: Treating Provider/Extender: Aurelio Brash in Treatment: 9 Debridement Performed for Assessment: Wound #4 Right Amputation Site - Transmetatarsal Performed By: Physician Maxwell Caul., MD The following information was scribed by: Karie Schwalbe The information was scribed for: Baltazar Najjar Debridement Type: Debridement Severity of Tissue Pre Debridement: Fat layer exposed Level of Consciousness (Pre-procedure): Awake and Alert Pre-procedure Verification/Time Out Yes - 12:52 Taken: Start Time: 12:52 Pain Control: Lidocaine 5% topical ointment Percent of Wound Bed Debrided: 100% T Area Debrided (cm): otal 0.59 Tissue and other material debrided: Viable, Non-Viable, Callus, Subcutaneous, Skin: Dermis , Skin: Epidermis Level: Skin/Subcutaneous Tissue Debridement Description: Excisional Instrument: Curette Bleeding: Moderate Hemostasis Achieved: Silver Nitrate End Time: 12:54 Procedural Pain: 0 Post Procedural Pain: 0 Response to Treatment: Procedure was tolerated well Level of Consciousness (Post- Awake and Alert procedure): Post Debridement Measurements of Total Wound Length: (cm) 0.5 Width: (cm) 1.5 Depth: (cm) 0.6 Volume: (cm) 0.353 Character of Wound/Ulcer Post Debridement: Improved Severity of Tissue Post Debridement: Fat layer exposed Post Procedure Diagnosis Same as Pre-procedure Electronic Signature(s) Signed: 10/11/2022 9:31:55 AM By: Baltazar Najjar MD Entered By: Baltazar Najjar on 10/10/2022  10:04:46 -------------------------------------------------------------------------------- HPI Details Patient Name: Date of Service: [name], [name] [name] 10/10/2022 12:30 PM Medical Record Number: [id number] Patient Account Number: [id number] Date of Birth/Sex: Treating RN: 02-05-1980 (42 y.o. F) Primary Care Provider: Gwinda Passe Other Clinician: Referring Provider: Treating Provider/Extender: Aurelio Brash in Treatment: [address] Sharol Harness ([id number]) [id number] Page 2 of 9 History of Present Illness HPI Description: Admission 01/22/2021 [name] a 42 year old female with a past medical history of insulin-dependent uncontrolled type 2 diabetes with last hemoglobin A1c of 13.5, osteomyelitis of the right foot status post transmetatarsal amputation on 12/18/2020 that presents to the clinic for right foot wound. She has had dehiscence of the surgical site. [name] using wet-to-dry dressings. She has a PICC line and receiving IV ceftriaxone daily for her osteomyelitis. There [name] an end date of 01/27/2021. [name] also taking oral metronidazole. She currently denies systemic signs of infection. 1/19; patient presents for follow-up. She was diagnosed with a DVT to the right lower extremity 2 days ago. [name] on Eliquis now. [name] scheduled to see her infectious disease doctor tomorrow. She has been using Dakin's wet-to-dry dressings. She denies systemic signs of infection. 1/26; patient presents for follow-up. She saw infectious disease on 1/21 started on Augmentin. Her PICC line and IV ceftriaxone was discontinued. Patient reports stability to her wound. She has been using Dakin's wet-to-dry dressings. She currently denies systemic signs of infection. 2/3; patient presents for follow-up. She continues to use Dakin's wet-to-dry dressings to the wound bed. She saw Dr. Manson Passey with infectious disease yesterday and [name] continuing  Augmentin. T entative end date [name] 2/16. Patient reports following up with orthopedics. She states there [name] no further plan from them. She currently denies systemic signs of infection. 2/10; patient presents for follow-up. She continues to use Dakin's wet to dry dressings. [name] scheduled to have her MRI done on 2/14. She states that she had pain to the debridement site from last clinic visit and declines  debridement today. She denies systemic signs of infection. She continues to have yellow thick drainage. 2/20; patient presents for follow-up. She continues to use Dakin's wet-to-dry dressings. She obtained her MRI. The results showed an abscess and [name] scheduled to see her orthopedic surgeon on 2/23. She saw infectious disease 2/17 and her antibiotics were extended. She currently denies systemic signs of infection. 3/6; patient presents for follow-up. She had debridement and irrigation of her foot on 03/10/2021 due to abscess noted on MRI. She was started on IV ceftriaxone and oral Flagyl. She has no issues or complaints today. She has been using iodoform packing to the tunnel and Dakin's wet-to-dry to the opening. 03/26/2021: She continues on IV ceftriaxone and oral metronidazole. She has follow-up with infectious disease tomorrow. No significant issues or complaints today. Her mother continues to help her with her wound dressing, using iodoform packing strips into the tunnel and Dakin's to the open portion of the wound. 3/20; patient presents for follow-up. She continues to be on IV ceftriaxone in oral metronidazole. She has been using iodoform to the tunnel and Dakin's wet-to- dry to the open wound. She denies signs of infection. 3/27; patient presents for follow-up. She no longer has a PICC line. She has been using iodoform to the tunnel and Dakin's wet-to-dry to the open wound. She reports improvement in wound healing. She denies signs of infection. 4/3; patient presents for follow-up. She  states she has been using Hydrofera Blue to the open wound and iodoform packing to the tunnel without any issues. She denies signs of infection. 4/18; patient presents for follow-up. She saw infectious disease on 4/11. She has finished her oral antibiotics and completed a total of 6 weeks of antibiotics (this includes IV as well). No further antibiotics needed. She has been using Hydrofera Blue and iodoform packing. She states that the tunneled area has come in and the iodoform [name] not staying in place anymore. She has no issues or complaints today. She denies signs of infection. 4/24; patient presents for follow-up. She saw Dr. Carlene Coria, plastic surgery to discuss potential skin graft/substitute placement. At this time he thinks that the skin graft would likely not take. He [name] in agreement with trying a wound VAC. Patient has been using Hydrofera Blue dressing changes with no issues. She denies signs of infection. She reports improvement in wound healing. 5/1; patient presents for follow-up. Unfortunately patient did not have insurance when we ran for the pico. There [name] an assistance program and we are trying to get this accommodated for the patient. In the meantime she has been using Hydrofera Blue without any issues. She denies signs of infection. 5/8; patient presents for follow-up. We have not heard back if pico [name] covered by her insurance. She has been using collagen to the wound bed over the past week. She denies signs of infection. 5/18; patient presents for follow-up. She has been using collagen to the wound bed without issues. Again we have not heard if pico [name] covered by her insurance. She has no issues or complaints today. 5/23; patient presents for follow-up. She has been using collagen to the wound bed. She has no issues or complaints today. She obtained the wound VAC from [facility] and brought this in today. She denies signs of infection. 6/1; patient presents for follow-up. She has been using  the wound VAC for the past week. She has had this changed twice since she was last here. She reports more maceration to the periwound. She denies  signs of infection. 6/7; right TMA site. There are 2 wounds 0 separated by a bridge of healed tissue. The more lateral area has undermining. Both areas have healthy looking granulation at the base but relative the size of the wound [name] fairly deep. There [name] no exposed bone no evidence of infection. Her wound VAC was put on hold last week because of surrounding skin maceration she has been using collagen this week. She has a modified shoe 6/12; patient presents for follow-up. Last week the wound VAC was reinitiated. She had no issues with the wound VAC itself. Today she has maceration again noted to the surrounding skin. She denies signs of infection. 6/27; patient presents for follow-up. She has been using Medihoney to the wound bed. We took a break from the wound VAC because the periwound was macerated. She still has some areas of maceration to the distal foot where there [name] a callus. She currently denies signs of infection. 7/11; patient presents for follow-up. She has been using Medihoney to the wound bed. She has developed some increased warmth and erythema to the lateral aspect of the right foot. She states this [name] occurred over the past week and there [name] increased pain. No drainage noted. 7/17; patient presents for follow-up. She has been using Medihoney to the wound bed. She completed her course of Bactrim. She reports improvement in symptoms. 7/24; Patient presents for follow up. She has been using Medihoney to the wound bed without issues. She completed another course of Bactrim. She reports improvement in her symptoms but still has some mild tenderness to the medial aspect of the foot. 7/31; Patient presents for follow-up. She has been using Medihoney and Dakin's to the wound bed. She denies signs of infection. 8/14; patient presents for follow-up.  She has been using Dakin's wet-to-dry packing strips to the right medial aspect of the amputation site and Medihoney to the anterior site. She has no issues or complaints today. She has started physical therapy. She denies signs of infection. [name], [name] ([id number]) [id number] Page 3 of 9 8/29; patient presents for follow-up. She has been using Dakin's wet-to-dry packing strips to the right medial aspect of the amputation site however this [name] becoming more difficult to place. She did report that she had increased redness and swelling to that site and developed some drainage. It has resolved. She continues with physical therapy. 9/8; patient presents for follow-up. We have been using silver alginate to the tunneled area. She completed her course of antibiotics. She reports no pain, increased swelling or erythema. 9/21; patient presents for follow-up. She has been using gentamicin to the tunneled area. She has no issues or complaints today. She denies signs of infection. 10/2; patient presents for follow-up. [name] scheduled to have her CT scan on 10/9. She currently denies signs of infection. She denies increased warmth, erythema or purulent drainage from the wound bed. She has been using Dakin's wet-to-dry dressings. 10/12; patient presents for follow-up. She had her CT scan on 10/9 that showed A small irregular rim-enhancing fluid pocket communicating to the overlying soft tissues of the sinus tract compatible with a small abscess. Currently she denies systemic signs of infection. She has been doing Dakin's wet-to-dry packing strips but it [name] hard for her to pack into the narrow opening. 10/30; patient presents for follow-up. Since last clinic visit she has had OR debridement of her Right foot by Dr. Odis Hollingshead Due to abscess noted on CT. She has been using iodoform packing. 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 She will  complete 2 weeks of this and continue with oral amoxicillin for the next 6 to 12 months. She follows with Dr. Luciana Axe for this. She currently denies signs of infection. 11/13; patient presents for follow-up. Patient has been using silver alginate with gentamicin to the wound bed. She has no issues or complaints today she. She reports improvement in wound healing. 12/4; patient presents for follow-up. She has been using silver alginate and gentamicin to the wound bed. She has no issues or complaints today. 12/8; patient presents for follow-up. She has been using silver alginate with gentamicin to the wound bed. She presents for her first cast placement. She will be back early next week for her obligatory cast change. 12/12; patient presents for follow-up. We have been using collagen with antibiotic ointment to the wound bed under a total contact cast. She has tolerated this well. [name] improved in wound healing. 12/18; patient presents for follow-up. We have been using collagen with antibiotic ointment under the total contact cast. She has no complaints today. 1/2; The cast was placed at last clinic visit however patient missed her follow-up and this was taken off last week at a nurse visit. Patient has been using packing strips to the medial narrowed wound. She has noted no drainage. 1/22; patient presents for follow-up. She has been keeping the area covered. She reports no drainage. She has had no issues to the previous wound site. She denies any signs of infection including increased warmth, erythema or purulent drainage. 08/08/2022 Ms. Dayla Gasca [name] a 42 year old female with a past medical history of insulin-dependent uncontrolled type 2 diabetes with last hemoglobin A1c of 8.8, chronic osteomyelitis of the right foot status post transmetatarsal amputation on 12/18/2020 that has been previously treated for surgical dehiscence of this site in our clinic. She presents today 6 months after  discharge from our clinic due to healed right foot wound with now reopening of the previous surgical site. She reports chronic pain to the site however denies purulent drainage, increased warmth or erythema. She noticed the wound opening about 1 month ago. She has been doing physical therapy without significant issues. She has inserts to her shoe however she has been advised to follow-up with [facility] for new custom inserts by her orthopedic surgeon now that she has a new wound. She states she will try and do this. She has been using antibiotic ointment to the wound bed. 8/1; patient presents for follow-up. She has been taking her antibiotics without issues. She has been using Dakin's wet-to-dry dressings to the wound bed. The wound [name] smaller. She denies systemic signs of infection. 8/13; patient presents for follow-up. She had ABIs completed that were normal. On the right it was 1.02 and on the left was 0.99. She has been using silver alginate to the wound bed with gentamicin ointment. She states it [name] hard to pack the wound bed. She currently denies signs of infection. 8/29; patient presents for follow-up. She has been using collagen with antibiotic ointment to the wound bed. Wound [name] stable. She denies signs of infection. 9/5; patient presents for follow-up. She has been using silver alginate to the wound bed. Wound [name] slightly wider. She denies signs of infection. 9/12; patient presents for follow-up. She has been using silver alginate to the wound bed. Wound [name] slightly smaller today. 9/19; right foot in the setting of a TMA over the first metatarsal head. She  has thick callus around this area small fissured wound which actually looks like it [name] attempting to close. She has a offloading shoe I am not sure how much activity [name] in. I did not have a lot of time to see her today because of transportation 9/26; right foot in the setting of a previous TMA. The wound [name] over the plantar first  metatarsal head. This [name] a fissured wound with thick callus around the margins. It [name] small in terms of overall surface area but has a depth of about 0.6 cm there was no palpable bone. She has a history of chronic osteomyelitis although she has been treated for this I have reviewed CT scans from 2023 also an MRI from 10/26/2021. Neither imaging study showed osteomyelitis in this area although the CT scan suggested osteomyelitis in the fourth metatarsal head. There has been problems with abscesses communicating with the wound although there does not appear to be purulent drainage currently Electronic Signature(s) Signed: 10/11/2022 9:31:55 AM By: Baltazar Najjar MD Entered By: Baltazar Najjar on 10/10/2022 10:09:09 -------------------------------------------------------------------------------- Physical Exam Details Patient Name: Date of Service: [name] V IS, [name] [name] 10/10/2022 12:30 PM Medical Record Number: [id number] Patient Account Number: [id number] [name], [name] ([id number]) [id number] Page 4 of 9 Date of Birth/Sex: Treating RN: [birth year] (42 y.o. F) Primary Care Provider: Other Clinician: Gwinda Passe Referring Provider: Treating Provider/Extender: Aurelio Brash in Treatment: 9 Constitutional Sitting or standing Blood Pressure [name] within target range for patient.. Pulse regular and within target range for patient.Marland Kitchen Respirations regular, non-labored and within target range.. Temperature [name] normal and within the target range for the patient.Marland Kitchen Appears in no distress. Notes Wound exam; right foot over the first metatarsal head. Previous transmetatarsal amputation. Thick callus around the wound I removed with a #10 scalpel and pickups. I then used a #5 curette to remove rounded nonviable edges from the wound circumference. Hemostasis was silver nitrate. The wound does not probe to bone however it [name] difficult linear wound with some  depth Electronic Signature(s) Signed: 10/11/2022 9:31:55 AM By: Baltazar Najjar MD Entered By: Baltazar Najjar on 10/10/2022 11:43:40 -------------------------------------------------------------------------------- Physician Orders Details Patient Name: Date of Service: [name] V IS, [name] [name] 10/10/2022 12:30 PM Medical Record Number: [id number] Patient Account Number: [id number] Date of Birth/Sex: Treating RN: 17-Sep-1980 (42 y.o. Katrinka Blazing Primary Care Provider: Gwinda Passe Other Clinician: Referring Provider: Treating Provider/Extender: Aurelio Brash in Treatment: 9 Verbal / Phone Orders: No Diagnosis Coding Follow-up Appointments ppointment in 1 week. - Dr. Leanord Hawking Room 9 10/17/22 at 12:30 pm Return A Other: - -Already DONE Vascular and vein appointment 08/21/22. T oral antibiotics ake call [facility] to get shoe adjusted. [phone] [address] [place]-DONE Anesthetic (In clinic) Topical Lidocaine 4% applied to wound bed Cellular or Tissue Based Products Wound #4 Right Amputation Site - Transmetatarsal Cellular or Tissue Based Product Type: - RUN IVR for APLIGRAF (09/12/22)-SENT NOTES Bathing/ Shower/ Hygiene May shower with protection but do not get wound dressing(s) wet. Protect dressing(s) with water repellant cover (for example, large plastic bag) or a cast cover and may then take shower. Other Bathing/Shower/Hygiene Orders/Instructions: - When changing the dressing (every other day) you may get right foot wet. On days NOT changing the dressing.Keep wound/dressing dry Edema Control - Lymphedema / SCD / Other Elevate legs to the level of the heart or above for 30 minutes daily and/or when sitting for 3-4 times a day throughout the day.  Avoid standing for long periods of time. Off-Loading Open toe surgical shoe to: Wound Treatment Wound #4 - Amputation Site - Transmetatarsal Wound Laterality: Right Cleanser: Soap and Water  Every Other Day/30 Days Discharge Instructions: May shower and wash wound with dial antibacterial soap and water prior to dressing change. On days not changing the dressing, please keep wound/dressing dry Prim Dressing: Iodosorb Gel 10 (gm) Tube Every Other Day/30 Days ary Discharge Instructions: Soak Gauze with Iodosorb and place into wound bed.Apply to wound bed as instructed Secondary Dressing: ABD Pad, 8x10 Every Other Day/30 Days Discharge Instructions: Apply over primary dressing as directed. AFTIN, LYE ([id number]) [id number] Page 5 of 9 Secured With: [employer], 4.5x3.1 (in/yd) Every Other Day/30 Days Discharge Instructions: Secure with Kerlix as directed. Secured With: 49M Medipore H Soft Cloth Surgical T ape, 4 x 10 (in/yd) Every Other Day/30 Days Discharge Instructions: Secure with tape as directed. Secured With: ace wrap Every Other Day/30 Days Discharge Instructions: apply lightly to hold dressing in place. Electronic Signature(s) Signed: 10/10/2022 1:43:41 PM By: Karie Schwalbe RN Signed: 10/11/2022 9:31:55 AM By: Baltazar Najjar MD Entered By: Karie Schwalbe on 10/10/2022 10:06:52 -------------------------------------------------------------------------------- Problem List Details Patient Name: Date of Service: [name] V IS, [name] [name] 10/10/2022 12:30 PM Medical Record Number: [id number] Patient Account Number: [id number] Date of Birth/Sex: Treating RN: 14-Dec-1980 (42 y.o. F) Primary Care Provider: Gwinda Passe Other Clinician: Referring Provider: Treating Provider/Extender: Aurelio Brash in Treatment: 9 Active Problems ICD-10 Encounter Code Description Active Date MDM Diagnosis L97.512 Non-pressure chronic ulcer of other part of right foot with fat layer exposed 08/08/2022 No Yes E11.621 Type 2 diabetes mellitus with foot ulcer 08/08/2022 No Yes M86.671 Other chronic osteomyelitis, right ankle and  foot 08/08/2022 No Yes Inactive Problems Resolved Problems Electronic Signature(s) Signed: 10/11/2022 9:31:55 AM By: Baltazar Najjar MD Entered By: Baltazar Najjar on 10/10/2022 10:04:20 -------------------------------------------------------------------------------- Progress Note Details Patient Name: Date of Service: [name], [name] [name] 10/10/2022 12:30 PM Medical Record Number: [id number] Patient Account Number: [id number] Date of Birth/Sex: Treating RN: January 11, 1981 (42 y.o. F) Primary Care Provider: Gwinda Passe Other Clinician: Referring Provider: Treating Provider/Extender: Ceciley, [place], Sharol Harness ([id number]) [id number] Page 6 of 9 Weeks in Treatment: 9 Subjective History of Present Illness (HPI) Admission 01/22/2021 Ms. Lynsie Mcwatters [name] a 42 year old female with a past medical history of insulin-dependent uncontrolled type 2 diabetes with last hemoglobin A1c of 13.5, osteomyelitis of the right foot status post transmetatarsal amputation on 12/18/2020 that presents to the clinic for right foot wound. She has had dehiscence of the surgical site. [name] using wet-to-dry dressings. She has a PICC line and receiving IV ceftriaxone daily for her osteomyelitis. There [name] an end date of 01/27/2021. [name] also taking oral metronidazole. She currently denies systemic signs of infection. 1/19; patient presents for follow-up. She was diagnosed with a DVT to the right lower extremity 2 days ago. [name] on Eliquis now. [name] scheduled to see her infectious disease doctor tomorrow. She has been using Dakin's wet-to-dry dressings. She denies systemic signs of infection. 1/26; patient presents for follow-up. She saw infectious disease on 1/21 started on Augmentin. Her PICC line and IV ceftriaxone was discontinued. Patient reports stability to her wound. She has been using Dakin's wet-to-dry dressings. She currently denies systemic signs  of infection. 2/3; patient presents for follow-up. She continues to use Dakin's wet-to-dry dressings to the wound bed. She saw Dr. Manson Passey with infectious disease  yesterday and [name] continuing Augmentin. T entative end date [name] 2/16. Patient reports following up with orthopedics. She states there [name] no further plan from them. She currently denies systemic signs of infection. 2/10; patient presents for follow-up. She continues to use Dakin's wet to dry dressings. [name] scheduled to have her MRI done on 2/14. She states that she had pain to the debridement site from last clinic visit and declines debridement today. She denies systemic signs of infection. She continues to have yellow thick drainage. 2/20; patient presents for follow-up. She continues to use Dakin's wet-to-dry dressings. She obtained her MRI. The results showed an abscess and [name] scheduled to see her orthopedic surgeon on 2/23. She saw infectious disease 2/17 and her antibiotics were extended. She currently denies systemic signs of infection. 3/6; patient presents for follow-up. She had debridement and irrigation of her foot on 03/10/2021 due to abscess noted on MRI. She was started on IV ceftriaxone and oral Flagyl. She has no issues or complaints today. She has been using iodoform packing to the tunnel and Dakin's wet-to-dry to the opening. 03/26/2021: She continues on IV ceftriaxone and oral metronidazole. She has follow-up with infectious disease tomorrow. No significant issues or complaints today. Her mother continues to help her with her wound dressing, using iodoform packing strips into the tunnel and Dakin's to the open portion of the wound. 3/20; patient presents for follow-up. She continues to be on IV ceftriaxone in oral metronidazole. She has been using iodoform to the tunnel and Dakin's wet-to- dry to the open wound. She denies signs of infection. 3/27; patient presents for follow-up. She no longer has a PICC line. She has  been using iodoform to the tunnel and Dakin's wet-to-dry to the open wound. She reports improvement in wound healing. She denies signs of infection. 4/3; patient presents for follow-up. She states she has been using Hydrofera Blue to the open wound and iodoform packing to the tunnel without any issues. She denies signs of infection. 4/18; patient presents for follow-up. She saw infectious disease on 4/11. She has finished her oral antibiotics and completed a total of 6 weeks of antibiotics (this includes IV as well). No further antibiotics needed. She has been using Hydrofera Blue and iodoform packing. She states that the tunneled area has come in and the iodoform [name] not staying in place anymore. She has no issues or complaints today. She denies signs of infection. 4/24; patient presents for follow-up. She saw Dr. Carlene Coria, plastic surgery to discuss potential skin graft/substitute placement. At this time he thinks that the skin graft would likely not take. He [name] in agreement with trying a wound VAC. Patient has been using Hydrofera Blue dressing changes with no issues. She denies signs of infection. She reports improvement in wound healing. 5/1; patient presents for follow-up. Unfortunately patient did not have insurance when we ran for the pico. There [name] an assistance program and we are trying to get this accommodated for the patient. In the meantime she has been using Hydrofera Blue without any issues. She denies signs of infection. 5/8; patient presents for follow-up. We have not heard back if pico [name] covered by her insurance. She has been using collagen to the wound bed over the past week. She denies signs of infection. 5/18; patient presents for follow-up. She has been using collagen to the wound bed without issues. Again we have not heard if pico [name] covered by her insurance. She has no issues or complaints today. 5/23; patient  presents for follow-up. She has been using collagen to the wound  bed. She has no issues or complaints today. She obtained the wound VAC from [facility] and brought this in today. She denies signs of infection. 6/1; patient presents for follow-up. She has been using the wound VAC for the past week. She has had this changed twice since she was last here. She reports more maceration to the periwound. She denies signs of infection. 6/7; right TMA site. There are 2 wounds 0 separated by a bridge of healed tissue. The more lateral area has undermining. Both areas have healthy looking granulation at the base but relative the size of the wound [name] fairly deep. There [name] no exposed bone no evidence of infection. Her wound VAC was put on hold last week because of surrounding skin maceration she has been using collagen this week. She has a modified shoe 6/12; patient presents for follow-up. Last week the wound VAC was reinitiated. She had no issues with the wound VAC itself. Today she has maceration again noted to the surrounding skin. She denies signs of infection. 6/27; patient presents for follow-up. She has been using Medihoney to the wound bed. We took a break from the wound VAC because the periwound was macerated. She still has some areas of maceration to the distal foot where there [name] a callus. She currently denies signs of infection. 7/11; patient presents for follow-up. She has been using Medihoney to the wound bed. She has developed some increased warmth and erythema to the lateral aspect of the right foot. She states this [name] occurred over the past week and there [name] increased pain. No drainage noted. 7/17; patient presents for follow-up. She has been using Medihoney to the wound bed. She completed her course of Bactrim. She reports improvement in symptoms. 7/24; Patient presents for follow up. She has been using Medihoney to the wound bed without issues. She completed another course of Bactrim. She reports improvement in her symptoms but still has some mild tenderness to  the medial aspect of the foot. 7/31; Patient presents for follow-up. She has been using Medihoney and Dakin's to the wound bed. She denies signs of infection. 8/14; patient presents for follow-up. She has been using Dakin's wet-to-dry packing strips to the right medial aspect of the amputation site and Medihoney to the anterior site. She has no issues or complaints today. She has started physical therapy. She denies signs of infection. [name], [name] ([id number]) [id number] Page 7 of 9 8/29; patient presents for follow-up. She has been using Dakin's wet-to-dry packing strips to the right medial aspect of the amputation site however this [name] becoming more difficult to place. She did report that she had increased redness and swelling to that site and developed some drainage. It has resolved. She continues with physical therapy. 9/8; patient presents for follow-up. We have been using silver alginate to the tunneled area. She completed her course of antibiotics. She reports no pain, increased swelling or erythema. 9/21; patient presents for follow-up. She has been using gentamicin to the tunneled area. She has no issues or complaints today. She denies signs of infection. 10/2; patient presents for follow-up. [name] scheduled to have her CT scan on 10/9. She currently denies signs of infection. She denies increased warmth, erythema or purulent drainage from the wound bed. She has been using Dakin's wet-to-dry dressings. 10/12; patient presents for follow-up. She had her CT scan on 10/9 that showed A small irregular rim-enhancing fluid pocket communicating to the  overlying soft tissues of the sinus tract compatible with a small abscess. Currently she denies systemic signs of infection. She has been doing Dakin's wet-to-dry packing strips but it [name] hard for her to pack into the narrow opening. 10/30; patient presents for follow-up. Since last clinic visit she has had OR  debridement of her Right foot by Dr. Odis Hollingshead Due to abscess noted on CT. She has been using iodoform packing. 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 She will complete 2 weeks of this and continue with oral amoxicillin for the next 6 to 12 months. She follows with Dr. Luciana Axe for this. She currently denies signs of infection. 11/13; patient presents for follow-up. Patient has been using silver alginate with gentamicin to the wound bed. She has no issues or complaints today she. She reports improvement in wound healing. 12/4; patient presents for follow-up. She has been using silver alginate and gentamicin to the wound bed. She has no issues or complaints today. 12/8; patient presents for follow-up. She has been using silver alginate with gentamicin to the wound bed. She presents for her first cast placement. She will be back early next week for her obligatory cast change. 12/12; patient presents for follow-up. We have been using collagen with antibiotic ointment to the wound bed under a total contact cast. She has tolerated this well. [name] improved in wound healing. 12/18; patient presents for follow-up. We have been using collagen with antibiotic ointment under the total contact cast. She has no complaints today. 1/2; The cast was placed at last clinic visit however patient missed her follow-up and this was taken off last week at a nurse visit. Patient has been using packing strips to the medial narrowed wound. She has noted no drainage. 1/22; patient presents for follow-up. She has been keeping the area covered. She reports no drainage. She has had no issues to the previous wound site. She denies any signs of infection including increased warmth, erythema or purulent drainage. 08/08/2022 Ms. Dail Meece [name] a 42 year old female with a past medical history of insulin-dependent uncontrolled type 2 diabetes with last hemoglobin A1c of 8.8, chronic  osteomyelitis of the right foot status post transmetatarsal amputation on 12/18/2020 that has been previously treated for surgical dehiscence of this site in our clinic. She presents today 6 months after discharge from our clinic due to healed right foot wound with now reopening of the previous surgical site. She reports chronic pain to the site however denies purulent drainage, increased warmth or erythema. She noticed the wound opening about 1 month ago. She has been doing physical therapy without significant issues. She has inserts to her shoe however she has been advised to follow-up with [facility] for new custom inserts by her orthopedic surgeon now that she has a new wound. She states she will try and do this. She has been using antibiotic ointment to the wound bed. 8/1; patient presents for follow-up. She has been taking her antibiotics without issues. She has been using Dakin's wet-to-dry dressings to the wound bed. The wound [name] smaller. She denies systemic signs of infection. 8/13; patient presents for follow-up. She had ABIs completed that were normal. On the right it was 1.02 and on the left was 0.99. She has been using silver alginate to the wound bed with gentamicin ointment. She states it [name] hard to pack the wound bed. She currently denies signs of infection. 8/29; patient presents for follow-up. She has been using collagen with  antibiotic ointment to the wound bed. Wound [name] stable. She denies signs of infection. 9/5; patient presents for follow-up. She has been using silver alginate to the wound bed. Wound [name] slightly wider. She denies signs of infection. 9/12; patient presents for follow-up. She has been using silver alginate to the wound bed. Wound [name] slightly smaller today. 9/19; right foot in the setting of a TMA over the first metatarsal head. She has thick callus around this area small fissured wound which actually looks like it [name] attempting to close. She has a offloading shoe I am  not sure how much activity [name] in. I did not have a lot of time to see her today because of transportation 9/26; right foot in the setting of a previous TMA. The wound [name] over the plantar first metatarsal head. This [name] a fissured wound with thick callus around the margins. It [name] small in terms of overall surface area but has a depth of about 0.6 cm there was no palpable bone. She has a history of chronic osteomyelitis although she has been treated for this I have reviewed CT scans from 2023 also an MRI from 10/26/2021. Neither imaging study showed osteomyelitis in this area although the CT scan suggested osteomyelitis in the fourth metatarsal head. There has been problems with abscesses communicating with the wound although there does not appear to be purulent drainage currently Objective Constitutional Sitting or standing Blood Pressure [name] within target range for patient.. Pulse regular and within target range for patient.Marland Kitchen Respirations regular, non-labored and within target range.. Temperature [name] normal and within the target range for the patient.Marland Kitchen Appears in no distress. Vitals Time Taken: 12:35 PM, Height: 69 in, Weight: 330 lbs, BMI: 48.7, Temperature: 98 F, Pulse: 80 bpm, Respiratory Rate: 18 breaths/min, Blood Pressure: 116/79 mmHg. INAS, AVENA ([id number]) [id number] Page 8 of 9 General Notes: Wound exam; right foot over the first metatarsal head. Previous transmetatarsal amputation. Thick callus around the wound I removed with a #10 scalpel and pickups. I then used a #5 curette to remove rounded nonviable edges from the wound circumference. Hemostasis was silver nitrate. The wound does not probe to bone however it [name] difficult linear wound with some depth Integumentary (Hair, Skin) Wound #4 status [name] Open. Original cause of wound was Shear/Friction. The date acquired was: 07/08/2022. The wound has been in treatment 9 weeks. The wound [name] located on the  Right Amputation Site - Transmetatarsal. The wound measures 0.5cm length x 1.5cm width x 0.6cm depth; 0.589cm^2 area and 0.353cm^3 volume. There [name] Fat Layer (Subcutaneous Tissue) exposed. There [name] no tunneling noted, however, there [name] undermining starting at 6:00 and ending at 7:00 with a maximum distance of 0.3cm. There [name] a medium amount of serosanguineous drainage noted. The wound margin [name] distinct with the outline attached to the wound base. There [name] small (1-33%) pink granulation within the wound bed. There [name] a large (67-100%) amount of necrotic tissue within the wound bed including Eschar and Adherent Slough. The periwound skin appearance exhibited: Callus, Maceration. The periwound skin appearance did not exhibit: Crepitus, Excoriation, Induration, Rash, Scarring, Dry/Scaly, Atrophie Blanche, Cyanosis, Ecchymosis, Hemosiderin Staining, Mottled, Pallor, Rubor, Erythema. The periwound has tenderness on palpation. Assessment Active Problems ICD-10 Non-pressure chronic ulcer of other part of right foot with fat layer exposed Type 2 diabetes mellitus with foot ulcer Other chronic osteomyelitis, right ankle and foot Procedures Wound #4 Pre-procedure diagnosis of Wound #4 [name] a Diabetic Wound/Ulcer of the Lower Extremity located on the  Right Amputation Site - Transmetatarsal .Severity of Tissue Pre Debridement [name]: Fat layer exposed. There was a Excisional Skin/Subcutaneous Tissue Debridement with a total area of 0.59 sq cm performed by Maxwell Caul., MD. With the following instrument(s): Curette to remove Viable and Non-Viable tissue/material. Material removed includes Callus, Subcutaneous Tissue, Skin: Dermis, and Skin: Epidermis after achieving pain control using Lidocaine 5% topical ointment. No specimens were taken. A time out was conducted at 12:52, prior to the start of the procedure. A Moderate amount of bleeding was controlled with Silver Nitrate. The procedure was tolerated well  with a pain level of 0 throughout and a pain level of 0 following the procedure. Post Debridement Measurements: 0.5cm length x 1.5cm width x 0.6cm depth; 0.353cm^3 volume. Character of Wound/Ulcer Post Debridement [name] improved. Severity of Tissue Post Debridement [name]: Fat layer exposed. Post procedure Diagnosis Wound #4: Same as Pre-Procedure Plan Follow-up Appointments: Return Appointment in 1 week. - Dr. Leanord Hawking Room 9 10/17/22 at 12:30 pm Other: - -Already DONE Vascular and vein appointment 08/21/22. T oral antibiotics call [facility] to get shoe adjusted. [phone] [facility], [place]-DONE Anesthetic: (In clinic) Topical Lidocaine 4% applied to wound bed Cellular or Tissue Based Products: Wound #4 Right Amputation Site - Transmetatarsal: Cellular or Tissue Based Product Type: - RUN IVR for APLIGRAF (09/12/22)-SENT NOTES Bathing/ Shower/ Hygiene: May shower with protection but do not get wound dressing(s) wet. Protect dressing(s) with water repellant cover (for example, large plastic bag) or a cast cover and may then take shower. Other Bathing/Shower/Hygiene Orders/Instructions: - When changing the dressing (every other day) you may get right foot wet. On days NOT changing the dressing.Keep wound/dressing dry Edema Control - Lymphedema / SCD / Other: Elevate legs to the level of the heart or above for 30 minutes daily and/or when sitting for 3-4 times a day throughout the day. Avoid standing for long periods of time. Off-Loading: Open toe surgical shoe to: WOUND #4: - Amputation Site - Transmetatarsal Wound Laterality: Right Cleanser: Soap and Water Every Other Day/30 Days Discharge Instructions: May shower and wash wound with dial antibacterial soap and water prior to dressing change. On days not changing the dressing, please keep wound/dressing dry Prim Dressing: Iodosorb Gel 10 (gm) Tube Every Other Day/30 Days ary Discharge Instructions: Soak Gauze with Iodosorb and  place into wound bed.Apply to wound bed as instructed Secondary Dressing: ABD Pad, 8x10 Every Other Day/30 Days Discharge Instructions: Apply over primary dressing as directed. Secured With: [employer], 4.5x3.1 (in/yd) Every Other Day/30 Days Discharge Instructions: Secure with Kerlix as directed. Secured With: 74M Medipore H Soft Cloth Surgical T ape, 4 x 10 (in/yd) Every Other Day/30 Days Discharge Instructions: Secure with tape as directed. Secured With: ace wrap Every Other Day/30 Days Discharge Instructions: apply lightly to hold dressing in place. [name], [name] ([id number]) [id number] Page 9 of 9 1. Small fissured wound in the right heel. I removed callus skin and nonviable tissue around the wound margins hopefully to give this a surface for final epithelialization 2. I used Iodosorb gel with gauze and the small wound packing this into the wound. I am hopeful that this will help hold the wound together. That [name] been my experience in this in the past Electronic Signature(s) Signed: 10/11/2022 9:31:55 AM By: Baltazar Najjar MD Entered By: Baltazar Najjar on 10/10/2022 11:45:08 -------------------------------------------------------------------------------- SuperBill Details Patient Name: Date of Service: [name] Seth Bake IS, [name] [name] 10/10/2022 Medical Record Number: [id number] Patient Account Number:  [id number] Date of Birth/Sex: Treating RN: Apr 13, 1980 (42 y.o. F) Primary Care Provider: Gwinda Passe Other Clinician: Referring Provider: Treating Provider/Extender: Aurelio Brash in Treatment: 9 Diagnosis Coding ICD-10 Codes Code Description [phone] Non-pressure chronic ulcer of other part of right foot with fat layer exposed E11.621 Type 2 diabetes mellitus with foot ulcer M86.671 Other chronic osteomyelitis, right ankle and foot Facility Procedures : CPT4 Code: [id number] Description: 11042 - DEB SUBQ TISSUE 20 SQ CM/<  ICD-10 Diagnosis Description L97.512 Non-pressure chronic ulcer of other part of right foot with fat layer exposed E11.621 Type 2 diabetes mellitus with foot ulcer Modifier: Quantity: 1 Physician Procedures : CPT4 Code Description Modifier [id number] 11042 - WC PHYS SUBQ TISS 20 SQ CM ICD-10 Diagnosis Description L97.512 Non-pressure chronic ulcer of other part of right foot with fat layer exposed E11.621 Type 2 diabetes mellitus with foot ulcer Quantity: 1 Electronic Signature(s) Signed: 10/11/2022 9:31:55 AM By: Baltazar Najjar MD Entered By: Baltazar Najjar on 10/10/2022 11:45:25

## 2022-10-11 NOTE — Progress Notes (Signed)
[name] ([id number]) [id number] Page 1 of 9 Visit Report for 10/10/2022 Arrival Information Details Patient Name: Date of Service: [name], [place] [name]. 10/10/2022 12:30 PM Medical Record Number: [id number] Patient Account Number: [id number] Date of Birth/Sex: Treating RN: [name] (42 y.o. Katrinka Blazing Primary Care Krishna Dancel: Gwinda Passe Other Clinician: Referring Cyle Kenyon: Treating Lusine Corlett/Extender: Aurelio Brash in Treatment: 9 Visit Information History Since Last Visit Added or deleted any medications: No Patient Arrived: Wheel Chair Any new allergies or adverse reactions: No Arrival Time: 12:30 Had a fall or experienced change in No Accompanied By: self activities of daily living that may affect Transfer Assistance: Manual risk of falls: Patient Identification Verified: Yes Signs or symptoms of abuse/neglect since last visito No Patient Requires Transmission-Based Precautions: No Hospitalized since last visit: No Patient Has Alerts: Yes Implantable device outside of the clinic excluding No Patient Alerts: Patient on Blood Thinner cellular tissue based products placed in the center Eliquis since last visit: ABI R 1.02 (08/21/22) Has Dressing in Place as Prescribed: Yes ABI [name] 0.99 (08/21/22) Pain Present Now: Yes Electronic Signature(s) Signed: 10/10/2022 1:43:41 PM By: Karie Schwalbe RN Entered By: Karie Schwalbe on 10/10/2022 09:51:38 -------------------------------------------------------------------------------- Encounter Discharge Information Details Patient Name: Date of Service: [name] [name], [name] [name]. 10/10/2022 12:30 PM Medical Record Number: [id number] Patient Account Number: [id number] Date of Birth/Sex: Treating RN: [birth year] (42 y.o. Katrinka Blazing Primary Care Kaleeya Hancock: Gwinda Passe Other Clinician: Referring Amiree No: Treating Refugia Laneve/Extender: Aurelio Brash in Treatment: 9 Encounter Discharge Information Items Post Procedure Vitals Discharge Condition: Stable Temperature (F): 98 Ambulatory Status: Wheelchair Pulse (bpm): 80 Discharge Destination: Home Respiratory Rate (breaths/min): 18 Transportation: Private Auto Blood Pressure (mmHg): 116/79 Accompanied By: self Schedule Follow-up Appointment: Yes Clinical Summary of Care: Patient Declined Electronic Signature(s) Signed: 10/10/2022 1:43:41 PM By: Karie Schwalbe RN Entered By: Karie Schwalbe on 10/10/2022 10:43:12 [name], [name] ([id number]) [id number] Page 2 of 9 -------------------------------------------------------------------------------- Lower Extremity Assessment Details Patient Name: Date of Service: [name] [name], [name] [name]. 10/10/2022 12:30 PM Medical Record Number: [id number] Patient Account Number: [id number] Date of Birth/Sex: Treating RN: [name][birth year] (42 y.o. Katrinka Blazing Primary Care Rhyker Silversmith: Gwinda Passe Other Clinician: Referring Shakur Lembo: Treating Lillyian Heidt/Extender: Aurelio Brash in Treatment: 9 Edema Assessment Assessed: Kyra Searles: No] Franne Forts: No] Edema: [Left: Ye] [Right: s] Calf Left: Right: Point of Measurement: 31 cm From Medial Instep 52 cm Ankle Left: Right: Point of Measurement: 9 cm From Medial Instep 27.2 cm Vascular Assessment Pulses: Dorsalis Pedis Palpable: [Right:Yes] Extremity colors, hair growth, and conditions: Extremity Color: [Right:Hyperpigmented] Hair Growth on Extremity: [Right:No] Temperature of Extremity: [Right:Warm] Capillary Refill: [Right:< 3 seconds] Dependent Rubor: [Right:No Yes] Electronic Signature(s) Signed: 10/10/2022 1:43:41 PM By: Karie Schwalbe RN Entered By: Karie Schwalbe on 10/10/2022 09:55:09 -------------------------------------------------------------------------------- Multi Wound Chart Details Patient Name: Date of Service: [name],  [name] [name]. 10/10/2022 12:30 PM Medical Record Number: [id number] Patient Account Number: [id number] Date of Birth/Sex: Treating RN: [name], [name] (42 y.o. F) Primary Care Rupinder Livingston: Gwinda Passe Other Clinician: Referring Loveda Colaizzi: Treating Reshawn Ostlund/Extender: Aurelio Brash in Treatment: 9 Vital Signs Height(in): 69 Pulse(bpm): 80 Weight(lbs): 330 Blood Pressure(mmHg): 116/79 Body Mass Index(BMI): 48.7 Temperature(F): 98 Respiratory Rate(breaths/min): 18 [4:Photos:] [N/A:N/A [id number] Page 3 of 9] Right Amputation Site - N/A N/A Wound Location: Transmetatarsal Shear/Friction N/A N/A Wounding Event: Diabetic Wound/Ulcer of the Lower N/A N/A Primary Etiology: Extremity Dehisced Wound N/A N/A Secondary Etiology: Deep Vein Thrombosis, Hypertension,  N/A N/A Comorbid History: Type II Diabetes, Osteomyelitis, Neuropathy 07/08/2022 N/A N/A Date Acquired: 9 N/A N/A Weeks of Treatment: Open N/A N/A Wound Status: No N/A N/A Wound Recurrence: 0.5x1.5x0.6 N/A N/A Measurements [name] x W x D (cm) 0.589 N/A N/A A (cm) : rea 0.353 N/A N/A Volume (cm) : -167.70% N/A N/A % Reduction in A rea: -100.60% N/A N/A % Reduction in Volume: 6 Starting Position 1 (o'clock): 7 Ending Position 1 (o'clock): 0.3 Maximum Distance 1 (cm): Yes N/A N/A Undermining: Grade 3 N/A N/A Classification: Medium N/A N/A Exudate A mount: Serosanguineous N/A N/A Exudate Type: red, brown N/A N/A Exudate Color: Distinct, outline attached N/A N/A Wound Margin: Small (1-33%) N/A N/A Granulation A mount: Pink N/A N/A Granulation Quality: Large (67-100%) N/A N/A Necrotic A mount: Eschar, Adherent Slough N/A N/A Necrotic Tissue: Fat Layer (Subcutaneous Tissue): Yes N/A N/A Exposed Structures: Fascia: No Tendon: No Muscle: No Joint: No Bone: No Small (1-33%) N/A N/A Epithelialization: Debridement - Excisional N/A  N/A Debridement: Pre-procedure Verification/Time Out 12:52 N/A N/A Taken: Lidocaine 5% topical ointment N/A N/A Pain Control: Callus, Subcutaneous N/A N/A Tissue Debrided: Skin/Subcutaneous Tissue N/A N/A Level: 0.59 N/A N/A Debridement A (sq cm): rea Curette N/A N/A Instrument: Moderate N/A N/A Bleeding: Silver Nitrate N/A N/A Hemostasis A chieved: 0 N/A N/A Procedural Pain: 0 N/A N/A Post Procedural Pain: Procedure was tolerated well N/A N/A Debridement Treatment Response: 0.5x1.5x0.6 N/A N/A Post Debridement Measurements [name] x W x D (cm) 0.353 N/A N/A Post Debridement Volume: (cm) Callus: Yes N/A N/A Periwound Skin Texture: Excoriation: No Induration: No Crepitus: No Rash: No Scarring: No Maceration: Yes N/A N/A Periwound Skin Moisture: Dry/Scaly: No Atrophie Blanche: No N/A N/A Periwound Skin Color: Cyanosis: No Ecchymosis: No Erythema: No Hemosiderin Staining: No Mottled: No Pallor: No Rubor: No Yes N/A N/A Tenderness on Palpation: Debridement N/A N/A Procedures Performed: Treatment Notes Electronic Signature(s) TAITUM, MENTON [name] ([id number]) [id number] Page 4 of 9 Signed: 10/11/2022 9:31:55 AM By: Baltazar Najjar MD Entered By: Baltazar Najjar on 10/10/2022 10:04:36 -------------------------------------------------------------------------------- Multi-Disciplinary Care Plan Details Patient Name: Date of Service: [name] Seth Bake [name], [name] [name]. 10/10/2022 12:30 PM Medical Record Number: [id number] Patient Account Number: [id number] Date of Birth/Sex: Treating RN: [birth year], [name] (42 y.o. Katrinka Blazing Primary Care Jatinder Mcdonagh: Gwinda Passe Other Clinician: Referring Leydi Winstead: Treating Farron Watrous/Extender: Aurelio Brash in Treatment: 9 Active Inactive Necrotic Tissue Nursing Diagnoses: Knowledge deficit related to management of necrotic/devitalized tissue Goals: Necrotic/devitalized tissue will be minimized  in the wound bed Date Initiated: 08/08/2022 Target Resolution Date: 01/13/2023 Goal Status: Active Patient/caregiver will verbalize understanding of reason and process for debridement of necrotic tissue Date Initiated: 08/08/2022 Target Resolution Date: 01/13/2023 Goal Status: Active Interventions: Assess patient pain level pre-, during and post procedure and prior to discharge Provide education on necrotic tissue and debridement process Notes: Nutrition Nursing Diagnoses: Impaired glucose control: actual or potential Goals: Patient/caregiver agrees to and verbalizes understanding of need to obtain nutritional consultation Date Initiated: 08/08/2022 Target Resolution Date: 01/13/2023 Goal Status: Active Patient/caregiver will maintain therapeutic glucose control Date Initiated: 08/08/2022 Target Resolution Date: 01/13/2023 Goal Status: Active Interventions: Assess HgA1c results as ordered upon admission and as needed Provide education on elevated blood sugars and impact on wound healing Provide education on nutrition Treatment Activities: Obtain HgA1c : 08/08/2022 Patient referred to Primary Care Physician for further nutritional evaluation : 08/08/2022 Notes: Orientation to the Wound Care Program Nursing Diagnoses: Knowledge deficit related to the wound healing center program Goals: Patient/caregiver will  verbalize understanding of the Wound Healing Center Program Date Initiated: 08/08/2022 Target Resolution Date: 01/13/2023 Goal Status: Active [name], [name] ([id number]) [id number] Page 5 of 9 Interventions: Provide education on orientation to the wound center Notes: Pain, Acute or Chronic Nursing Diagnoses: Pain, acute or chronic: actual or potential Potential alteration in comfort, pain Goals: Patient will verbalize adequate pain control and receive pain control interventions during procedures as needed Date Initiated: 08/08/2022 Target  Resolution Date: 01/13/2023 Goal Status: Active Patient/caregiver will verbalize comfort level met Date Initiated: 08/08/2022 Target Resolution Date: 01/13/2023 Goal Status: Active Interventions: Complete pain assessment as per visit requirements Encourage patient to take pain medications as prescribed Provide education on pain management Treatment Activities: Administer pain control measures as ordered : 08/08/2022 Notes: Wound/Skin Impairment Nursing Diagnoses: Knowledge deficit related to ulceration/compromised skin integrity Goals: Patient/caregiver will verbalize understanding of skin care regimen Date Initiated: 08/08/2022 Target Resolution Date: 01/13/2023 Goal Status: Active Ulcer/skin breakdown will heal within 14 weeks Date Initiated: 08/08/2022 Target Resolution Date: 01/13/2023 Goal Status: Active Interventions: Assess patient/caregiver ability to perform ulcer/skin care regimen upon admission and as needed Assess ulceration(s) every visit Provide education on ulcer and skin care Treatment Activities: Skin care regimen initiated : 08/08/2022 Topical wound management initiated : 08/08/2022 Notes: Electronic Signature(s) Signed: 10/10/2022 1:43:41 PM By: Karie Schwalbe RN Entered By: Karie Schwalbe on 10/10/2022 10:41:08 -------------------------------------------------------------------------------- Pain Assessment Details Patient Name: Date of Service: [name] Pereyra, [name] [name]. 10/10/2022 12:30 PM Medical Record Number: [id number] Patient Account Number: [id number] Date of Birth/Sex: Treating RN: [birth year] [name] (42 y.o. Katrinka Blazing Primary Care Monetta Lick: Gwinda Passe Other Clinician: Referring Tremont Gavitt: Treating Trysten Berti/Extender: Aurelio Brash in Treatment: [address] Problems [name], [name] ([id number]) [id number] Page 6 of 9 Location of Pain Severity and Description of Pain Patient Has Paino Yes Site  Locations Pain Location: Generalized Pain With Dressing Change: Yes Duration of the Pain. Constant / Intermittento Constant Rate the pain. Current Pain Level: 3 Worst Pain Level: 10 Least Pain Level: 3 Tolerable Pain Level: 3 Character of Pain Describe the Pain: Difficult to Pinpoint Pain Management and Medication Current Pain Management: Medication: No Cold Application: No Rest: No Massage: No Activity: No T.E.N.S.: No Heat Application: No Leg drop or elevation: No [name] the Current Pain Management Adequate: Inadequate How does your wound impact your activities of daily livingo Sleep: No Bathing: No Appetite: No Relationship With Others: No Bladder Continence: No Emotions: No Bowel Continence: No Work: No Toileting: No Drive: No Dressing: No Hobbies: No Electronic Signature(s) Signed: 10/10/2022 1:43:41 PM By: Karie Schwalbe RN Entered By: Karie Schwalbe on 10/10/2022 09:54:57 -------------------------------------------------------------------------------- Patient/Caregiver Education Details Patient Name: Date of Service: [name], [name] [name]. 9/26/2024andnbsp12:30 PM Medical Record Number: [id number] Patient Account Number: [id number] Date of Birth/Gender: Treating RN: [name] (42 y.o. Katrinka Blazing Primary Care Physician: Gwinda Passe Other Clinician: Referring Physician: Treating Physician/Extender: Aurelio Brash in Treatment: 9 Education Assessment Education Provided To: Patient Education Topics Provided Wound/Skin Impairment: Methods: Explain/Verbal Responses: State content correctly [name], [name] ([id number]) [id number] Page 7 of 9 Electronic Signature(s) Signed: 10/10/2022 1:43:41 PM By: Karie Schwalbe RN Entered By: Karie Schwalbe on 10/10/2022 10:41:22 -------------------------------------------------------------------------------- Wound Assessment Details Patient Name: Date of  Service: [name] Seth Bake [name], [name] [name]. 10/10/2022 12:30 PM Medical Record Number: [id number] Patient Account Number: [id number] Date of Birth/Sex: Treating RN: [birth year][name] (42 y.o. 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 Secondary Dehisced Wound Wounding Event: Shear/Friction Etiology: Date Acquired: 07/08/2022 Wound Status: Open Weeks Of Treatment: 9 Comorbid Deep Vein Thrombosis, Hypertension, Type II Diabetes, Clustered Wound: No History: Osteomyelitis, Neuropathy Photos Wound Measurements Length: (cm) 0.5 Width: (cm) 1.5 Depth: (cm) 0.6 Area: (cm) 0.589 Volume: (cm) 0.353 % Reduction in Area: -167.7% % Reduction in Volume: -100.6% Epithelialization: Small (1-33%) Tunneling: No Undermining: Yes Starting Position (o'clock): 6 Ending Position (o'clock): 7 Maximum Distance: (cm) 0.3 Wound Description Classification: Grade 3 Wound Margin: Distinct, outline attached Exudate Amount: Medium Exudate Type: Serosanguineous Exudate Color: red, brown Foul Odor After Cleansing: No Slough/Fibrino Yes Wound Bed Granulation Amount: Small (1-33%) Exposed Structure Granulation Quality: Pink Fascia Exposed: No Necrotic Amount: Large (67-100%) Fat Layer (Subcutaneous Tissue) Exposed: Yes Necrotic Quality: Eschar, Adherent Slough Tendon Exposed: No Muscle Exposed: No Joint Exposed: No Bone Exposed: No Periwound Skin Texture Texture Color No Abnormalities Noted: No No Abnormalities Noted: No [name], [name] ([id number]) [id number] Page 8 of 9 Callus: Yes Atrophie Blanche: No Crepitus: No Cyanosis: No Excoriation: No Ecchymosis: No Induration: No Erythema: No Rash:  No Hemosiderin Staining: No Scarring: No Mottled: No Pallor: No Moisture Rubor: No No Abnormalities Noted: No Dry / Scaly: No Temperature / Pain Maceration: Yes Tenderness on Palpation: Yes Treatment Notes Wound #4 (Amputation Site - Transmetatarsal) Wound Laterality: Right Cleanser Soap and Water Discharge Instruction: May shower and wash wound with dial antibacterial soap and water prior to dressing change. On days not changing the dressing, please keep wound/dressing dry Peri-Wound Care Topical Primary Dressing Iodosorb Gel 10 (gm) Tube Discharge Instruction: Soak Gauze with Iodosorb and place into wound bed.Apply to wound bed as instructed Secondary Dressing ABD Pad, 8x10 Discharge Instruction: Apply over primary dressing as directed. Secured With [employer], 4.5x3.1 (in/yd) Discharge Instruction: Secure with Kerlix as directed. 25M Medipore H Soft Cloth Surgical T ape, 4 x 10 (in/yd) Discharge Instruction: Secure with tape as directed. ace wrap Discharge Instruction: apply lightly to hold dressing in place. Compression Wrap Compression Stockings Add-Ons Electronic Signature(s) Signed: 10/10/2022 1:43:41 PM By: Karie Schwalbe RN Entered By: Karie Schwalbe on 10/10/2022 09:39:38 -------------------------------------------------------------------------------- Vitals Details Patient Name: Date of Service: [name] [name], [name] [name]. 10/10/2022 12:30 PM Medical Record Number: [id number] Patient Account Number: [id number] Date of Birth/Sex: Treating RN: [name][birth year] (42 y.o. Katrinka Blazing Primary Care Idania Desouza: Gwinda Passe Other Clinician: Referring Miracle Mongillo: Treating Autry Droege/Extender: Aurelio Brash in Treatment: 9 Vital Signs Time Taken: 12:35 Temperature (F): 98 Height (in): 69 Pulse (bpm): 80 Weight (lbs): 330 Respiratory Rate (breaths/min): 18 Body Mass Index (BMI): 48.7 Blood Pressure (mmHg): 116/79 Reference Range: 80 -  120 mg / dl [place], [name] ([id number]) [id number].pdf Page 9 of 9 Electronic Signature(s) Signed: 10/10/2022 1:43:41 PM By: Karie Schwalbe RN Entered By: Karie Schwalbe on 10/10/2022 09:54:10

## 2022-10-17 ENCOUNTER — Encounter (HOSPITAL_BASED_OUTPATIENT_CLINIC_OR_DEPARTMENT_OTHER): Payer: Medicaid Other | Attending: Internal Medicine | Admitting: Internal Medicine

## 2022-10-17 DIAGNOSIS — E1140 Type 2 diabetes mellitus with diabetic neuropathy, unspecified: Secondary | ICD-10-CM | POA: Insufficient documentation

## 2022-10-17 DIAGNOSIS — E11621 Type 2 diabetes mellitus with foot ulcer: Secondary | ICD-10-CM | POA: Insufficient documentation

## 2022-10-17 DIAGNOSIS — G8929 Other chronic pain: Secondary | ICD-10-CM | POA: Insufficient documentation

## 2022-10-17 DIAGNOSIS — Z89431 Acquired absence of right foot: Secondary | ICD-10-CM | POA: Insufficient documentation

## 2022-10-17 DIAGNOSIS — Z794 Long term (current) use of insulin: Secondary | ICD-10-CM | POA: Insufficient documentation

## 2022-10-17 DIAGNOSIS — L97512 Non-pressure chronic ulcer of other part of right foot with fat layer exposed: Secondary | ICD-10-CM | POA: Insufficient documentation

## 2022-10-17 DIAGNOSIS — Z7901 Long term (current) use of anticoagulants: Secondary | ICD-10-CM | POA: Insufficient documentation

## 2022-10-17 DIAGNOSIS — M86671 Other chronic osteomyelitis, right ankle and foot: Secondary | ICD-10-CM | POA: Diagnosis not present

## 2022-10-18 NOTE — Progress Notes (Signed)
TYNA, HUERTAS ([id number]) [id number] Page 1 of 9 Visit Report for 10/17/2022 Arrival Information Details Patient Name: Date of Service: [name], [place] [name]. 10/17/2022 12:30 PM Medical Record Number: [id number] Patient Account Number: [id number] Date of Birth/Sex: Treating RN: Jun 08, 1980 (41 y.o. Katrinka Blazing Primary Care Markel Kurtenbach: Gwinda Passe Other Clinician: Referring Friend Dorfman: Treating [name]/Extender: Aurelio Brash in Treatment: 10 Visit Information History Since Last Visit Added or deleted any medications: No Patient Arrived: Wheel Chair Any new allergies or adverse reactions: No Arrival Time: 12:39 Had a fall or experienced change in No Accompanied By: self activities of daily living that may affect Transfer Assistance: None risk of falls: Patient Identification Verified: Yes Signs or symptoms of abuse/neglect since last visito No Patient Requires Transmission-Based Precautions: No Hospitalized since last visit: No Patient Has Alerts: Yes Implantable device outside of the clinic excluding No Patient Alerts: Patient on Blood Thinner cellular tissue based products placed in the center Eliquis since last visit: ABI R 1.02 (08/21/22) Has Dressing in Place as Prescribed: Yes ABI [name] 0.99 (08/21/22) Pain Present Now: Yes Electronic Signature(s) Signed: 10/17/2022 5:22:21 PM By: Karie Schwalbe RN Entered By: Karie Schwalbe on 10/17/2022 09:56:02 -------------------------------------------------------------------------------- Encounter Discharge Information Details Patient Name: Date of Service: [name] V IS, [name] [name]. 10/17/2022 12:30 PM Medical Record Number: [id number] Patient Account Number: [id number] Date of Birth/Sex: Treating RN: [birth year] (42 y.o. Katrinka Blazing Primary Care Sharicka Pogorzelski: Gwinda Passe Other Clinician: Referring Arlander Gillen: Treating Brennin Durfee/Extender: Aurelio Brash  in Treatment: 10 Encounter Discharge Information Items Post Procedure Vitals Discharge Condition: Stable Temperature (F): 98.1 Ambulatory Status: Wheelchair Pulse (bpm): 77 Discharge Destination: Home Respiratory Rate (breaths/min): 16 Transportation: Private Auto Blood Pressure (mmHg): 113/75 Accompanied By: self Schedule Follow-up Appointment: Yes Clinical Summary of Care: Patient Declined Electronic Signature(s) Signed: 10/17/2022 5:22:21 PM By: Karie Schwalbe RN Entered By: Karie Schwalbe on 10/17/2022 14:21:34 Whitfill, [name] ([id number]) [id number] Page 2 of 9 -------------------------------------------------------------------------------- Lower Extremity Assessment Details Patient Name: Date of Service: [name] Pereyra, [name] [name]. 10/17/2022 12:30 PM Medical Record Number: [id number] Patient Account Number: [id number] Date of Birth/Sex: Treating RN: 11-27-1980 (42 y.o. Katrinka Blazing Primary Care Luisantonio Adinolfi: Gwinda Passe Other Clinician: Referring Shade Kaley: Treating Briyah Wheelwright/Extender: Aurelio Brash in Treatment: 10 Edema Assessment Assessed: Kyra Searles: No] Franne Forts: No] Edema: [Left: Ye] [Right: s] Calf Left: Right: Point of Measurement: 31 cm From Medial Instep 52 cm Ankle Left: Right: Point of Measurement: 9 cm From Medial Instep 27.2 cm Vascular Assessment Pulses: Dorsalis Pedis Palpable: [Right:Yes] Extremity colors, hair growth, and conditions: Extremity Color: [Right:Hyperpigmented] Hair Growth on Extremity: [Right:No] Temperature of Extremity: [Right:Warm] Capillary Refill: [Right:< 3 seconds] Dependent Rubor: [Right:No Yes] Electronic Signature(s) Signed: 10/17/2022 5:22:21 PM By: Karie Schwalbe RN Entered By: Karie Schwalbe on 10/17/2022 09:57:28 -------------------------------------------------------------------------------- Multi Wound Chart Details Patient Name: Date of Service: [name] V IS, [name] [name].  10/17/2022 12:30 PM Medical Record Number: [id number] Patient Account Number: [id number] Date of Birth/Sex: Treating RN: 08-02-1980 (42 y.o. F) Primary Care Izzy Courville: Gwinda Passe Other Clinician: Referring Terisa Belardo: Treating Mikaiya Tramble/Extender: Aurelio Brash in Treatment: 10 Vital Signs Height(in): 69 Pulse(bpm): 77 Weight(lbs): 330 Blood Pressure(mmHg): 113/75 Body Mass Index(BMI): 48.7 Temperature(F): 98.1 Respiratory Rate(breaths/min): 16 [4:Photos:] [N/A:N/A [id number] Page 3 of 9] Right Amputation Site - N/A N/A Wound Location: Transmetatarsal Shear/Friction N/A N/A Wounding Event: Diabetic Wound/Ulcer of the Lower N/A N/A Primary Etiology: Extremity Dehisced Wound N/A N/A Secondary Etiology: Deep Vein Thrombosis, Hypertension,  N/A N/A Comorbid History: Type II Diabetes, Osteomyelitis, Neuropathy 07/08/2022 N/A N/A Date Acquired: 10 N/A N/A Weeks of Treatment: Open N/A N/A Wound Status: No N/A N/A Wound Recurrence: 0.4x1.4x0.6 N/A N/A Measurements [name] x W x D (cm) 0.44 N/A N/A A (cm) : rea 0.264 N/A N/A Volume (cm) : -100.00% N/A N/A % Reduction in A rea: -50.00% N/A N/A % Reduction in Volume: 6 Starting Position 1 (o'clock): 7 Ending Position 1 (o'clock): 0.3 Maximum Distance 1 (cm): Yes N/A N/A Undermining: Grade 3 N/A N/A Classification: Medium N/A N/A Exudate A mount: Serosanguineous N/A N/A Exudate Type: red, brown N/A N/A Exudate Color: Distinct, outline attached N/A N/A Wound Margin: Large (67-100%) N/A N/A Granulation A mount: Red N/A N/A Granulation Quality: Small (1-33%) N/A N/A Necrotic A mount: Eschar, Adherent Slough N/A N/A Necrotic Tissue: Fat Layer (Subcutaneous Tissue): Yes N/A N/A Exposed Structures: Fascia: No Tendon: No Muscle: No Joint: No Bone: No Small (1-33%) N/A N/A Epithelialization: Debridement - Selective/Open Wound N/A  N/A Debridement: Pre-procedure Verification/Time Out 13:12 N/A N/A Taken: Lidocaine 4% Topical Solution N/A N/A Pain Control: Slough N/A N/A Tissue Debrided: Skin/Epidermis N/A N/A Level: 0.44 N/A N/A Debridement A (sq cm): rea Curette N/A N/A Instrument: Minimum N/A N/A Bleeding: Pressure N/A N/A Hemostasis A chieved: 0 N/A N/A Procedural Pain: 0 N/A N/A Post Procedural Pain: Procedure was tolerated well N/A N/A Debridement Treatment Response: 0.4x1.4x0.6 N/A N/A Post Debridement Measurements [name] x W x D (cm) 0.264 N/A N/A Post Debridement Volume: (cm) Callus: Yes N/A N/A Periwound Skin Texture: Excoriation: No Induration: No Crepitus: No Rash: No Scarring: No Maceration: Yes N/A N/A Periwound Skin Moisture: Dry/Scaly: No Atrophie Blanche: No N/A N/A Periwound Skin Color: Cyanosis: No Ecchymosis: No Erythema: No Hemosiderin Staining: No Mottled: No Pallor: No Rubor: No Yes N/A N/A Tenderness on Palpation: Debridement N/A N/A Procedures Performed: Treatment Notes Electronic Signature(sALLANNAH, KEMPEN [name] ([id number]) [id number] Page 4 of 9 Signed: 10/17/2022 5:27:16 PM By: Baltazar Najjar MD Entered By: Baltazar Najjar on 10/17/2022 10:26:34 -------------------------------------------------------------------------------- Multi-Disciplinary Care Plan Details Patient Name: Date of Service: [name] Seth Bake IS, [name] [name]. 10/17/2022 12:30 PM Medical Record Number: [id number] Patient Account Number: [id number] Date of Birth/Sex: Treating RN: 1980/07/09 (41 y.o. Katrinka Blazing Primary Care Ryott Rafferty: Gwinda Passe Other Clinician: Referring Myha Arizpe: Treating Karyn Brull/Extender: Aurelio Brash in Treatment: 10 Active Inactive Necrotic Tissue Nursing Diagnoses: Knowledge deficit related to management of necrotic/devitalized tissue Goals: Necrotic/devitalized tissue will be minimized in the wound bed Date  Initiated: 08/08/2022 Target Resolution Date: 01/13/2023 Goal Status: Active Patient/caregiver will verbalize understanding of reason and process for debridement of necrotic tissue Date Initiated: 08/08/2022 Target Resolution Date: 01/13/2023 Goal Status: Active Interventions: Assess patient pain level pre-, during and post procedure and prior to discharge Provide education on necrotic tissue and debridement process Notes: Nutrition Nursing Diagnoses: Impaired glucose control: actual or potential Goals: Patient/caregiver agrees to and verbalizes understanding of need to obtain nutritional consultation Date Initiated: 08/08/2022 Target Resolution Date: 01/13/2023 Goal Status: Active Patient/caregiver will maintain therapeutic glucose control Date Initiated: 08/08/2022 Target Resolution Date: 01/13/2023 Goal Status: Active Interventions: Assess HgA1c results as ordered upon admission and as needed Provide education on elevated blood sugars and impact on wound healing Provide education on nutrition Treatment Activities: Obtain HgA1c : 08/08/2022 Patient referred to Primary Care Physician for further nutritional evaluation : 08/08/2022 Notes: Orientation to the Wound Care Program Nursing Diagnoses: Knowledge deficit related to the wound healing center program Goals: Patient/caregiver will verbalize understanding  of the Wound Healing Center Program Date Initiated: 08/08/2022 Target Resolution Date: 01/13/2023 Goal Status: Active [name], [name] ([id number]) [phone].pdf Page 5 of 9 Interventions: Provide education on orientation to the wound center Notes: Pain, Acute or Chronic Nursing Diagnoses: Pain, acute or chronic: actual or potential Potential alteration in comfort, pain Goals: Patient will verbalize adequate pain control and receive pain control interventions during procedures as needed Date Initiated: 08/08/2022 Target Resolution Date:  01/13/2023 Goal Status: Active Patient/caregiver will verbalize comfort level met Date Initiated: 08/08/2022 Target Resolution Date: 01/13/2023 Goal Status: Active Interventions: Complete pain assessment as per visit requirements Encourage patient to take pain medications as prescribed Provide education on pain management Treatment Activities: Administer pain control measures as ordered : 08/08/2022 Notes: Wound/Skin Impairment Nursing Diagnoses: Knowledge deficit related to ulceration/compromised skin integrity Goals: Patient/caregiver will verbalize understanding of skin care regimen Date Initiated: 08/08/2022 Target Resolution Date: 01/13/2023 Goal Status: Active Ulcer/skin breakdown will heal within 14 weeks Date Initiated: 08/08/2022 Target Resolution Date: 01/13/2023 Goal Status: Active Interventions: Assess patient/caregiver ability to perform ulcer/skin care regimen upon admission and as needed Assess ulceration(s) every visit Provide education on ulcer and skin care Treatment Activities: Skin care regimen initiated : 08/08/2022 Topical wound management initiated : 08/08/2022 Notes: Electronic Signature(s) Signed: 10/17/2022 5:22:21 PM By: Karie Schwalbe RN Entered By: Karie Schwalbe on 10/17/2022 10:27:11 -------------------------------------------------------------------------------- Pain Assessment Details Patient Name: Date of Service: [name] Pereyra, [name] [name]. 10/17/2022 12:30 PM Medical Record Number: [id number] Patient Account Number: [id number] Date of Birth/Sex: Treating RN: [birth year] (42 y.o. Katrinka Blazing Primary Care Brittan Butterbaugh: Gwinda Passe Other Clinician: Referring Mailani Degroote: Treating Tanina Barb/Extender: Aurelio Brash in Treatment: [address] Problems [place], [name] ([id number]) [id number] Page 6 of 9 Location of Pain Severity and Description of Pain Patient Has Paino Yes Site Locations Pain  Location: Generalized Pain With Dressing Change: No Duration of the Pain. Constant / Intermittento Constant Rate the pain. Current Pain Level: 3 Worst Pain Level: 10 Least Pain Level: 3 Tolerable Pain Level: 5 Character of Pain Describe the Pain: Difficult to Pinpoint Pain Management and Medication Current Pain Management: Medication: Yes Cold Application: No Rest: Yes Massage: No Activity: No T.E.N.S.: No Heat Application: No Leg drop or elevation: No [name] the Current Pain Management Adequate: Adequate How does your wound impact your activities of daily livingo Sleep: No Bathing: No Appetite: No Relationship With Others: No Bladder Continence: No Emotions: No Bowel Continence: No Work: No Toileting: No Drive: No Dressing: No Hobbies: No Electronic Signature(s) Signed: 10/17/2022 5:22:21 PM By: Karie Schwalbe RN Entered By: Karie Schwalbe on 10/17/2022 09:57:16 -------------------------------------------------------------------------------- Patient/Caregiver Education Details Patient Name: Date of Service: [name] [name], [name] [name]. 10/3/2024andnbsp12:30 PM Medical Record Number: [id number] Patient Account Number: [id number] Date of Birth/Gender: Treating RN: [birth year] (42 y.o. Katrinka Blazing Primary Care Physician: Gwinda Passe Other Clinician: Referring Physician: Treating Physician/Extender: Aurelio Brash in Treatment: 10 Education Assessment Education Provided To: Patient Education Topics Provided Wound/Skin Impairment: Methods: Explain/Verbal Responses: State content correctly [name], [name] ([id number]) [id number] Page 7 of 9 Electronic Signature(s) Signed: 10/17/2022 5:22:21 PM By: Karie Schwalbe RN Entered By: Karie Schwalbe on 10/17/2022 10:27:41 -------------------------------------------------------------------------------- Wound Assessment Details Patient Name: Date of Service: [name] Seth Bake IS,  [name] [name]. 10/17/2022 12:30 PM Medical Record Number: [id number] Patient Account Number: [id number] Date of Birth/Sex: Treating RN: 1980/05/22 (42 y.o. 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 Location: Right Amputation Site - Transmetatarsal Secondary Dehisced Wound Wounding Event: Shear/Friction Etiology: Date Acquired: 07/08/2022 Wound Status: Open Weeks Of Treatment: 10 Comorbid Deep Vein Thrombosis, Hypertension, Type II Diabetes, Clustered Wound: No History: Osteomyelitis, Neuropathy Photos Wound Measurements Length: (cm) 0.4 Width: (cm) 1.4 Depth: (cm) 0.6 Area: (cm) 0.44 Volume: (cm) 0.264 % Reduction in Area: -100% % Reduction in Volume: -50% Epithelialization: Small (1-33%) Undermining: Yes Starting Position (o'clock): 6 Ending Position (o'clock): 7 Maximum Distance: (cm) 0.3 Wound Description Classification: Grade 3 Wound Margin: Distinct, outline attached Exudate Amount: Medium Exudate Type: Serosanguineous Exudate Color: red, brown Foul Odor After Cleansing: No Slough/Fibrino Yes Wound Bed Granulation Amount: Large (67-100%) Exposed Structure Granulation Quality: Red Fascia Exposed: No Necrotic Amount: Small (1-33%) Fat Layer (Subcutaneous Tissue) Exposed: Yes Necrotic Quality: Eschar, Adherent Slough Tendon Exposed: No Muscle Exposed: No Joint Exposed: No Bone Exposed: No Periwound Skin Texture Texture Color No Abnormalities Noted: No No Abnormalities Noted: No Callus: Yes Atrophie Blanche: No Matzke, [name] ([id number]) [id number] Page 8 of 9 Crepitus: No Cyanosis: No Excoriation: No Ecchymosis: No Induration: No Erythema: No Rash: No Hemosiderin Staining: No Scarring:  No Mottled: No Pallor: No Moisture Rubor: No No Abnormalities Noted: No Dry / Scaly: No Temperature / Pain Maceration: Yes Tenderness on Palpation: Yes Treatment Notes Wound #4 (Amputation Site - Transmetatarsal) Wound Laterality: Right Cleanser Soap and Water Discharge Instruction: May shower and wash wound with dial antibacterial soap and water prior to dressing change. On days not changing the dressing, please keep wound/dressing dry Peri-Wound Care Topical Primary Dressing Iodosorb Gel 10 (gm) Tube Discharge Instruction: Soak Gauze with Iodosorb and place into wound bed.Apply to wound bed as instructed Secondary Dressing ABD Pad, 8x10 Discharge Instruction: Apply over primary dressing as directed. Secured With [employer], 4.5x3.1 (in/yd) Discharge Instruction: Secure with Kerlix as directed. 20M Medipore H Soft Cloth Surgical T ape, 4 x 10 (in/yd) Discharge Instruction: Secure with tape as directed. ace wrap Discharge Instruction: apply lightly to hold dressing in place. Compression Wrap Compression Stockings Add-Ons Electronic Signature(s) Signed: 10/17/2022 5:22:21 PM By: Karie Schwalbe RN Entered By: Karie Schwalbe on 10/17/2022 09:48:39 -------------------------------------------------------------------------------- Vitals Details Patient Name: Date of Service: [name] V IS, [name] [name]. 10/17/2022 12:30 PM Medical Record Number: [id number] Patient Account Number: [id number] Date of Birth/Sex: Treating RN: [birth year] (42 y.o. Katrinka Blazing Primary Care Madasyn Heath: Gwinda Passe Other Clinician: Referring Chanoch Mccleery: Treating [name]/Extender: Aurelio Brash in Treatment: 10 Vital Signs Time Taken: 12:39 Temperature (F): 98.1 Height (in): 69 Pulse (bpm): 77 Weight (lbs): 330 Respiratory Rate (breaths/min): 16 Body Mass Index (BMI): 48.7 Blood Pressure (mmHg): 113/75 Reference Range: 80 - 120 mg / dl Haro, [name]  ([id number]) [id number] Page 9 of 9 Electronic Signature(s) Signed: 10/17/2022 5:22:21 PM By: Karie Schwalbe RN Entered By: Karie Schwalbe on 10/17/2022 09:56:30

## 2022-10-18 NOTE — Progress Notes (Signed)
SALVATRICE, MORANDI ([id number]) [id number] Page 1 of 9 Visit Report for 10/17/2022 Debridement Details Patient Name: Date of Service: [name], [place] [name] 10/17/2022 12:30 PM Medical Record Number: [id number] Patient Account Number: [id number] Date of Birth/Sex: Treating RN: June 30, 1980 (42 y.o. F) Primary Care Provider: Gwinda [name] Other Clinician: Referring Provider: Treating Provider/Extender: [name] in Treatment: 10 Debridement Performed for Assessment: Wound #4 Right Amputation Site - Transmetatarsal Performed By: Physician [name] Caul., [name] The following information was scribed by: [name] The information was scribed for: [name] Debridement Type: Debridement Severity of Tissue Pre Debridement: Fat layer exposed Level of Consciousness (Pre-procedure): Awake and Alert Pre-procedure Verification/Time Out Yes - 13:12 Taken: Start Time: 13:12 Pain Control: Lidocaine 4% T opical Solution Percent of Wound Bed Debrided: 100% T Area Debrided (cm): otal 0.44 Tissue and other material debrided: Viable, Non-Viable, Slough, Skin: Dermis , Skin: Epidermis, Slough Level: Skin/Epidermis Debridement Description: Selective/Open Wound Instrument: Curette Bleeding: Minimum Hemostasis Achieved: Pressure End Time: 13:14 Procedural Pain: 0 Post Procedural Pain: 0 Response to Treatment: Procedure was tolerated well Level of Consciousness (Post- Awake and Alert procedure): Post Debridement Measurements of Total Wound Length: (cm) 0.4 Width: (cm) 1.4 Depth: (cm) 0.6 Volume: (cm) 0.264 Character of Wound/Ulcer Post Debridement: Improved Severity of Tissue Post Debridement: Fat layer exposed Post Procedure Diagnosis Same as Pre-procedure Electronic Signature(s) Signed: 10/17/2022 5:27:16 PM By: [name] Entered By: [name] on 10/17/2022  10:26:48 -------------------------------------------------------------------------------- HPI Details Patient Name: Date of Service: [name], [name] [name] 10/17/2022 12:30 PM Medical Record Number: [id number] Patient Account Number: [id number] Date of Birth/Sex: Treating RN: Sep 28, 1980 (42 y.o. F) Primary Care Provider: Gwinda [name] Other Clinician: Referring Provider: Treating Provider/Extender: [name] in Treatment: [address], [name] ([id number]) [id number] Page 2 of 9 History of Present Illness HPI Description: Admission 01/22/2021 [name] [name] a 42 year old female with a past medical history of insulin-dependent uncontrolled type 2 diabetes with last hemoglobin A1c of 13.5, osteomyelitis of the right foot status post transmetatarsal amputation on 12/18/2020 that presents to the clinic for right foot wound. She has had dehiscence of the surgical site. [name] using wet-to-dry dressings. She has a PICC line and receiving IV ceftriaxone daily for her osteomyelitis. There [name] an end date of 01/27/2021. [name] also taking oral metronidazole. She currently denies systemic signs of infection. 1/19; patient presents for follow-up. She was diagnosed with a DVT to the right lower extremity 2 days ago. [name] on Eliquis now. [name] scheduled to see her infectious disease doctor tomorrow. She has been using Dakin's wet-to-dry dressings. She denies systemic signs of infection. 1/26; patient presents for follow-up. She saw infectious disease on 1/21 started on Augmentin. Her PICC line and IV ceftriaxone was discontinued. Patient reports stability to her wound. She has been using Dakin's wet-to-dry dressings. She currently denies systemic signs of infection. 2/3; patient presents for follow-up. She continues to use Dakin's wet-to-dry dressings to the wound bed. She saw [name] with infectious disease yesterday and [name]  continuing Augmentin. T entative end date [name] 2/16. Patient reports following up with orthopedics. She states there [name] no further plan from them. She currently denies systemic signs of infection. 2/10; patient presents for follow-up. She continues to use Dakin's wet to dry dressings. [name] scheduled to have her MRI done on 2/14. She states that she had pain to the debridement site from last clinic visit and declines  debridement today. She denies systemic signs of infection. She continues to have yellow thick drainage. 2/20; patient presents for follow-up. She continues to use Dakin's wet-to-dry dressings. She obtained her MRI. The results showed an abscess and [name] scheduled to see her orthopedic surgeon on 2/23. She saw infectious disease 2/17 and her antibiotics were extended. She currently denies systemic signs of infection. 3/6; patient presents for follow-up. She had debridement and irrigation of her foot on 03/10/2021 due to abscess noted on MRI. She was started on IV ceftriaxone and oral Flagyl. She has no issues or complaints today. She has been using iodoform packing to the tunnel and Dakin's wet-to-dry to the opening. 03/26/2021: She continues on IV ceftriaxone and oral metronidazole. She has follow-up with infectious disease tomorrow. No significant issues or complaints today. Her mother continues to help her with her wound dressing, using iodoform packing strips into the tunnel and Dakin's to the open portion of the wound. 3/20; patient presents for follow-up. She continues to be on IV ceftriaxone in oral metronidazole. She has been using iodoform to the tunnel and Dakin's wet-to- dry to the open wound. She denies signs of infection. 3/27; patient presents for follow-up. She no longer has a PICC line. She has been using iodoform to the tunnel and Dakin's wet-to-dry to the open wound. She reports improvement in wound healing. She denies signs of infection. 4/3; patient presents for  follow-up. She states she has been using Hydrofera Blue to the open wound and iodoform packing to the tunnel without any issues. She denies signs of infection. 4/18; patient presents for follow-up. She saw infectious disease on 4/11. She has finished her oral antibiotics and completed a total of 6 weeks of antibiotics (this includes IV as well). No further antibiotics needed. She has been using Hydrofera Blue and iodoform packing. She states that the tunneled area has come in and the iodoform [name] not staying in place anymore. She has no issues or complaints today. She denies signs of infection. 4/24; patient presents for follow-up. She saw Dr. Carlene Coria, plastic surgery to discuss potential skin graft/substitute placement. At this time he thinks that the skin graft would likely not take. He [name] in agreement with trying a wound VAC. Patient has been using Hydrofera Blue dressing changes with no issues. She denies signs of infection. She reports improvement in wound healing. 5/1; patient presents for follow-up. Unfortunately patient did not have insurance when we ran for the pico. There [name] an assistance program and we are trying to get this accommodated for the patient. In the meantime she has been using Hydrofera Blue without any issues. She denies signs of infection. 5/8; patient presents for follow-up. We have not heard back if pico [name] covered by her insurance. She has been using collagen to the wound bed over the past week. She denies signs of infection. 5/18; patient presents for follow-up. She has been using collagen to the wound bed without issues. Again we have not heard if pico [name] covered by her insurance. She has no issues or complaints today. 5/23; patient presents for follow-up. She has been using collagen to the wound bed. She has no issues or complaints today. She obtained the wound VAC from [facility] and brought this in today. She denies signs of infection. 6/1; patient presents for follow-up. She  has been using the wound VAC for the past week. She has had this changed twice since she was last here. She reports more maceration to the periwound. She denies  signs of infection. 6/7; right TMA site. There are 2 wounds 0 separated by a bridge of healed tissue. The more lateral area has undermining. Both areas have healthy looking granulation at the base but relative the size of the wound [name] fairly deep. There [name] no exposed bone no evidence of infection. Her wound VAC was put on hold last week because of surrounding skin maceration she has been using collagen this week. She has a modified shoe 6/12; patient presents for follow-up. Last week the wound VAC was reinitiated. She had no issues with the wound VAC itself. Today she has maceration again noted to the surrounding skin. She denies signs of infection. 6/27; patient presents for follow-up. She has been using Medihoney to the wound bed. We took a break from the wound VAC because the periwound was macerated. She still has some areas of maceration to the distal foot where there [name] a callus. She currently denies signs of infection. 7/11; patient presents for follow-up. She has been using Medihoney to the wound bed. She has developed some increased warmth and erythema to the lateral aspect of the right foot. She states this [name] occurred over the past week and there [name] increased pain. No drainage noted. 7/17; patient presents for follow-up. She has been using Medihoney to the wound bed. She completed her course of Bactrim. She reports improvement in symptoms. 7/24; Patient presents for follow up. She has been using Medihoney to the wound bed without issues. She completed another course of Bactrim. She reports improvement in her symptoms but still has some mild tenderness to the medial aspect of the foot. 7/31; Patient presents for follow-up. She has been using Medihoney and Dakin's to the wound bed. She denies signs of infection. 8/14; patient presents  for follow-up. She has been using Dakin's wet-to-dry packing strips to the right medial aspect of the amputation site and Medihoney to the anterior site. She has no issues or complaints today. She has started physical therapy. She denies signs of infection. ANNALYSA, [name] ([id number]) [id number] Page 3 of 9 8/29; patient presents for follow-up. She has been using Dakin's wet-to-dry packing strips to the right medial aspect of the amputation site however this [name] becoming more difficult to place. She did report that she had increased redness and swelling to that site and developed some drainage. It has resolved. She continues with physical therapy. 9/8; patient presents for follow-up. We have been using silver alginate to the tunneled area. She completed her course of antibiotics. She reports no pain, increased swelling or erythema. 9/21; patient presents for follow-up. She has been using gentamicin to the tunneled area. She has no issues or complaints today. She denies signs of infection. 10/2; patient presents for follow-up. [name] scheduled to have her CT scan on 10/9. She currently denies signs of infection. She denies increased warmth, erythema or purulent drainage from the wound bed. She has been using Dakin's wet-to-dry dressings. 10/12; patient presents for follow-up. She had her CT scan on 10/9 that showed A small irregular rim-enhancing fluid pocket communicating to the overlying soft tissues of the sinus tract compatible with a small abscess. Currently she denies systemic signs of infection. She has been doing Dakin's wet-to-dry packing strips but it [name] hard for her to pack into the narrow opening. 10/30; patient presents for follow-up. Since last clinic visit she has had OR debridement of her Right foot by Dr. Odis Hollingshead Due to abscess noted on CT. She has been using iodoform packing. 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 She will complete 2 weeks of this and continue with oral amoxicillin for the next 6 to 12 months. She follows with Dr. Luciana Axe for this. She currently denies signs of infection. 11/13; patient presents for follow-up. Patient has been using silver alginate with gentamicin to the wound bed. She has no issues or complaints today she. She reports improvement in wound healing. 12/4; patient presents for follow-up. She has been using silver alginate and gentamicin to the wound bed. She has no issues or complaints today. 12/8; patient presents for follow-up. She has been using silver alginate with gentamicin to the wound bed. She presents for her first cast placement. She will be back early next week for her obligatory cast change. 12/12; patient presents for follow-up. We have been using collagen with antibiotic ointment to the wound bed under a total contact cast. She has tolerated this well. [name] improved in wound healing. 12/18; patient presents for follow-up. We have been using collagen with antibiotic ointment under the total contact cast. She has no complaints today. 1/2; The cast was placed at last clinic visit however patient missed her follow-up and this was taken off last week at a nurse visit. Patient has been using packing strips to the medial narrowed wound. She has noted no drainage. 1/22; patient presents for follow-up. She has been keeping the area covered. She reports no drainage. She has had no issues to the previous wound site. She denies any signs of infection including increased warmth, erythema or purulent drainage. 08/08/2022 [name] [name] a 42 year old female with a past medical history of insulin-dependent uncontrolled type 2 diabetes with last hemoglobin A1c of 8.8, chronic osteomyelitis of the right foot status post transmetatarsal amputation on 12/18/2020 that has been previously treated for surgical dehiscence of this site in our clinic. She presents today 6  months after discharge from our clinic due to healed right foot wound with now reopening of the previous surgical site. She reports chronic pain to the site however denies purulent drainage, increased warmth or erythema. She noticed the wound opening about 1 month ago. She has been doing physical therapy without significant issues. She has inserts to her shoe however she has been advised to follow-up with [facility] for new custom inserts by her orthopedic surgeon now that she has a new wound. She states she will try and do this. She has been using antibiotic ointment to the wound bed. 8/1; patient presents for follow-up. She has been taking her antibiotics without issues. She has been using Dakin's wet-to-dry dressings to the wound bed. The wound [name] smaller. She denies systemic signs of infection. 8/13; patient presents for follow-up. She had ABIs completed that were normal. On the right it was 1.02 and on the left was 0.99. She has been using silver alginate to the wound bed with gentamicin ointment. She states it [name] hard to pack the wound bed. She currently denies signs of infection. 8/29; patient presents for follow-up. She has been using collagen with antibiotic ointment to the wound bed. Wound [name] stable. She denies signs of infection. 9/5; patient presents for follow-up. She has been using silver alginate to the wound bed. Wound [name] slightly wider. She denies signs of infection. 9/12; patient presents for follow-up. She has been using silver alginate to the wound bed. Wound [name] slightly smaller today. 9/19; right foot in the setting of a TMA over the first metatarsal head. She  has thick callus around this area small fissured wound which actually looks like it [name] attempting to close. She has a offloading shoe I am not sure how much activity [name] in. I did not have a lot of time to see her today because of transportation 9/26; right foot in the setting of a previous TMA. The wound [name] over the plantar  first metatarsal head. This [name] a fissured wound with thick callus around the margins. It [name] small in terms of overall surface area but has a depth of about 0.6 cm there was no palpable bone. She has a history of chronic osteomyelitis although she has been treated for this I have reviewed CT scans from 2023 also an MRI from 10/26/2021. Neither imaging study showed osteomyelitis in this area although the CT scan suggested osteomyelitis in the fourth metatarsal head. There has been problems with abscesses communicating with the wound although there does not appear to be purulent drainage currently 10/3; right foot at the TMA site. Small probing wound. I changed her to Iodosorb with gauze last time. May be some improvement. She has a depth of about 0.6 cm but there [name] no palpable bone. She has been previously treated for chronic osteomyelitis in this area. Electronic Signature(s) Signed: 10/17/2022 5:27:16 PM By: [name] Entered By: [name] on 10/17/2022 10:27:39 [name], [name] ([id number]) [id number] Page 4 of 9 -------------------------------------------------------------------------------- Physical Exam Details Patient Name: Date of Service: [name] Millard IS, [name] [name] 10/17/2022 12:30 PM Medical Record Number: [id number] Patient Account Number: [id number] Date of Birth/Sex: Treating RN: [birth year] (42 y.o. F) Primary Care Provider: Gwinda [name] Other Clinician: Referring Provider: Treating Provider/Extender: [name] in Treatment: 10 Constitutional Sitting or standing Blood Pressure [name] within target range for patient.. Pulse regular and within target range for patient.Marland Kitchen Respirations regular, non-labored and within target range.. Temperature [name] normal and within the target range for the patient.. Notes Wound exam; right foot over the first metatarsal head at the previous TMA. She has some callus around this and I  actually removed some of this last week. I think the wound had gotten somewhat smaller in length and may be filling in in the distal part of it. I used a #3 curette to irritate the skins on the skin on the side of the relative depth of this. I am going to continue with the Iodoflex. The wound does not probe to bone and there [name] no evidence of infection Electronic Signature(s) Signed: 10/17/2022 5:27:16 PM By: [name] Entered By: [name] on 10/17/2022 10:28:34 -------------------------------------------------------------------------------- Physician Orders Details Patient Name: Date of Service: [name] V IS, [name] [name] 10/17/2022 12:30 PM Medical Record Number: [id number] Patient Account Number: [id number] Date of Birth/Sex: Treating RN: [birth year] (42 y.o. Katrinka Blazing Primary Care Provider: Gwinda [name] Other Clinician: Referring Provider: Treating Provider/Extender: [name] in Treatment: 10 Verbal / Phone Orders: No Diagnosis Coding Follow-up Appointments ppointment in 1 week. - Dr. Leanord Hawking Room 9 10/24/22 at 12:30 pm Return A Other: - -Already DONE Vascular and vein appointment 08/21/22. T oral antibiotics ake call [facility] to get shoe adjusted. [phone] [address], [place]-DONE Anesthetic (In clinic) Topical Lidocaine 4% applied to wound bed Cellular or Tissue Based Products Wound #4 Right Amputation Site - Transmetatarsal Cellular or Tissue Based Product Type: - RUN IVR for APLIGRAF (09/12/22)-SENT NOTES Bathing/ Shower/ Hygiene May shower with protection but do not get wound dressing(s) wet. Protect dressing(s)  with water repellant cover (for example, large plastic bag) or a cast cover and may then take shower. Other Bathing/Shower/Hygiene Orders/Instructions: - When changing the dressing (every other day) you may get right foot wet. On days NOT changing the dressing.Keep wound/dressing dry Edema Control -  Lymphedema / SCD / Other Elevate legs to the level of the heart or above for 30 minutes daily and/or when sitting for 3-4 times a day throughout the day. Avoid standing for long periods of time. Off-Loading Open toe surgical shoe to: Wound Treatment Wound #4 - Amputation Site - Transmetatarsal Wound Laterality: Right Cleanser: Soap and Water Every Other Day/30 Days Discharge Instructions: May shower and wash wound with dial antibacterial soap and water prior to dressing change. On days not changing the dressing, please keep wound/dressing dry Prim Dressing: Iodosorb Gel 10 (gm) Tube Every Other Day/30 Days ary Discharge Instructions: Soak Gauze with Iodosorb and place into wound bed.Apply to wound bed as instructed CAI, FLOTT ([id number]) [id number] Page 5 of 9 Secondary Dressing: ABD Pad, 8x10 Every Other Day/30 Days Discharge Instructions: Apply over primary dressing as directed. Secured With: [employer], 4.5x3.1 (in/yd) Every Other Day/30 Days Discharge Instructions: Secure with Kerlix as directed. Secured With: 57M Medipore H Soft Cloth Surgical T ape, 4 x 10 (in/yd) Every Other Day/30 Days Discharge Instructions: Secure with tape as directed. Secured With: ace wrap Every Other Day/30 Days Discharge Instructions: apply lightly to hold dressing in place. Electronic Signature(s) Signed: 10/17/2022 5:22:21 PM By: [name] Schwalbe RN Signed: 10/17/2022 5:27:16 PM By: [name] Entered By: [name] on 10/17/2022 10:15:59 -------------------------------------------------------------------------------- Problem List Details Patient Name: Date of Service: [name] Seth Bake IS, [name] [name] 10/17/2022 12:30 PM Medical Record Number: [id number] Patient Account Number: [id number] Date of Birth/Sex: Treating RN: October 22, 1980 (42 y.o. F) Primary Care Provider: Gwinda [name] Other Clinician: Referring Provider: Treating Provider/Extender: [name] in Treatment: 10 Active Problems ICD-10 Encounter Code Description Active Date MDM Diagnosis L97.512 Non-pressure chronic ulcer of other part of right foot with fat layer exposed 08/08/2022 No Yes E11.621 Type 2 diabetes mellitus with foot ulcer 08/08/2022 No Yes M86.671 Other chronic osteomyelitis, right ankle and foot 08/08/2022 No Yes Inactive Problems Resolved Problems Electronic Signature(s) Signed: 10/17/2022 5:27:16 PM By: [name] Entered By: [name] on 10/17/2022 10:25:13 -------------------------------------------------------------------------------- Progress Note Details Patient Name: Date of Service: [name] V IS, [name] [name] 10/17/2022 12:30 PM [name], [name] ([id number]) [id number] Page 6 of 9 Medical Record Number: [id number] Patient Account Number: [id number] Date of Birth/Sex: Treating RN: 01/12/1981 (42 y.o. F) Primary Care Provider: Gwinda [name] Other Clinician: Referring Provider: Treating Provider/Extender: [name] in Treatment: 10 Subjective History of Present Illness (HPI) Admission 01/22/2021 [name] [name] a 42 year old female with a past medical history of insulin-dependent uncontrolled type 2 diabetes with last hemoglobin A1c of 13.5, osteomyelitis of the right foot status post transmetatarsal amputation on 12/18/2020 that presents to the clinic for right foot wound. She has had dehiscence of the surgical site. [name] using wet-to-dry dressings. She has a PICC line and receiving IV ceftriaxone daily for her osteomyelitis. There [name] an end date of 01/27/2021. [name] also taking oral metronidazole. She currently denies systemic signs of infection. 1/19; patient presents for follow-up. She was diagnosed with a DVT to the right lower extremity 2 days ago. [name] on Eliquis now. [name] scheduled to see her infectious disease doctor tomorrow. She  has  been using Dakin's wet-to-dry dressings. She denies systemic signs of infection. 1/26; patient presents for follow-up. She saw infectious disease on 1/21 started on Augmentin. Her PICC line and IV ceftriaxone was discontinued. Patient reports stability to her wound. She has been using Dakin's wet-to-dry dressings. She currently denies systemic signs of infection. 2/3; patient presents for follow-up. She continues to use Dakin's wet-to-dry dressings to the wound bed. She saw [name] with infectious disease yesterday and [name] continuing Augmentin. T entative end date [name] 2/16. Patient reports following up with orthopedics. She states there [name] no further plan from them. She currently denies systemic signs of infection. 2/10; patient presents for follow-up. She continues to use Dakin's wet to dry dressings. [name] scheduled to have her MRI done on 2/14. She states that she had pain to the debridement site from last clinic visit and declines debridement today. She denies systemic signs of infection. She continues to have yellow thick drainage. 2/20; patient presents for follow-up. She continues to use Dakin's wet-to-dry dressings. She obtained her MRI. The results showed an abscess and [name] scheduled to see her orthopedic surgeon on 2/23. She saw infectious disease 2/17 and her antibiotics were extended. She currently denies systemic signs of infection. 3/6; patient presents for follow-up. She had debridement and irrigation of her foot on 03/10/2021 due to abscess noted on MRI. She was started on IV ceftriaxone and oral Flagyl. She has no issues or complaints today. She has been using iodoform packing to the tunnel and Dakin's wet-to-dry to the opening. 03/26/2021: She continues on IV ceftriaxone and oral metronidazole. She has follow-up with infectious disease tomorrow. No significant issues or complaints today. Her mother continues to help her with her wound dressing, using iodoform packing strips  into the tunnel and Dakin's to the open portion of the wound. 3/20; patient presents for follow-up. She continues to be on IV ceftriaxone in oral metronidazole. She has been using iodoform to the tunnel and Dakin's wet-to- dry to the open wound. She denies signs of infection. 3/27; patient presents for follow-up. She no longer has a PICC line. She has been using iodoform to the tunnel and Dakin's wet-to-dry to the open wound. She reports improvement in wound healing. She denies signs of infection. 4/3; patient presents for follow-up. She states she has been using Hydrofera Blue to the open wound and iodoform packing to the tunnel without any issues. She denies signs of infection. 4/18; patient presents for follow-up. She saw infectious disease on 4/11. She has finished her oral antibiotics and completed a total of 6 weeks of antibiotics (this includes IV as well). No further antibiotics needed. She has been using Hydrofera Blue and iodoform packing. She states that the tunneled area has come in and the iodoform [name] not staying in place anymore. She has no issues or complaints today. She denies signs of infection. 4/24; patient presents for follow-up. She saw Dr. Carlene Coria, plastic surgery to discuss potential skin graft/substitute placement. At this time he thinks that the skin graft would likely not take. He [name] in agreement with trying a wound VAC. Patient has been using Hydrofera Blue dressing changes with no issues. She denies signs of infection. She reports improvement in wound healing. 5/1; patient presents for follow-up. Unfortunately patient did not have insurance when we ran for the pico. There [name] an assistance program and we are trying to get this accommodated for the patient. In the meantime she has been using Hydrofera Blue without any issues. She  denies signs of infection. 5/8; patient presents for follow-up. We have not heard back if pico [name] covered by her insurance. She has been using  collagen to the wound bed over the past week. She denies signs of infection. 5/18; patient presents for follow-up. She has been using collagen to the wound bed without issues. Again we have not heard if pico [name] covered by her insurance. She has no issues or complaints today. 5/23; patient presents for follow-up. She has been using collagen to the wound bed. She has no issues or complaints today. She obtained the wound VAC from [facility] and brought this in today. She denies signs of infection. 6/1; patient presents for follow-up. She has been using the wound VAC for the past week. She has had this changed twice since she was last here. She reports more maceration to the periwound. She denies signs of infection. 6/7; right TMA site. There are 2 wounds 0 separated by a bridge of healed tissue. The more lateral area has undermining. Both areas have healthy looking granulation at the base but relative the size of the wound [name] fairly deep. There [name] no exposed bone no evidence of infection. Her wound VAC was put on hold last week because of surrounding skin maceration she has been using collagen this week. She has a modified shoe 6/12; patient presents for follow-up. Last week the wound VAC was reinitiated. She had no issues with the wound VAC itself. Today she has maceration again noted to the surrounding skin. She denies signs of infection. 6/27; patient presents for follow-up. She has been using Medihoney to the wound bed. We took a break from the wound VAC because the periwound was macerated. She still has some areas of maceration to the distal foot where there [name] a callus. She currently denies signs of infection. 7/11; patient presents for follow-up. She has been using Medihoney to the wound bed. She has developed some increased warmth and erythema to the lateral aspect of the right foot. She states this [name] occurred over the past week and there [name] increased pain. No drainage noted. 7/17; patient presents  for follow-up. She has been using Medihoney to the wound bed. She completed her course of Bactrim. She reports improvement in symptoms. 7/24; Patient presents for follow up. She has been using Medihoney to the wound bed without issues. She completed another course of Bactrim. She reports improvement in her symptoms but still has some mild tenderness to the medial aspect of the foot. [name], [name] ([id number]) [id number] Page 7 of 9 7/31; Patient presents for follow-up. She has been using Medihoney and Dakin's to the wound bed. She denies signs of infection. 8/14; patient presents for follow-up. She has been using Dakin's wet-to-dry packing strips to the right medial aspect of the amputation site and Medihoney to the anterior site. She has no issues or complaints today. She has started physical therapy. She denies signs of infection. 8/29; patient presents for follow-up. She has been using Dakin's wet-to-dry packing strips to the right medial aspect of the amputation site however this [name] becoming more difficult to place. She did report that she had increased redness and swelling to that site and developed some drainage. It has resolved. She continues with physical therapy. 9/8; patient presents for follow-up. We have been using silver alginate to the tunneled area. She completed her course of antibiotics. She reports no pain, increased swelling or erythema. 9/21; patient presents for follow-up. She has been using gentamicin to the tunneled  area. She has no issues or complaints today. She denies signs of infection. 10/2; patient presents for follow-up. [name] scheduled to have her CT scan on 10/9. She currently denies signs of infection. She denies increased warmth, erythema or purulent drainage from the wound bed. She has been using Dakin's wet-to-dry dressings. 10/12; patient presents for follow-up. She had her CT scan on 10/9 that showed A small irregular rim-enhancing  fluid pocket communicating to the overlying soft tissues of the sinus tract compatible with a small abscess. Currently she denies systemic signs of infection. She has been doing Dakin's wet-to-dry packing strips but it [name] hard for her to pack into the narrow opening. 10/30; patient presents for follow-up. Since last clinic visit she has had OR debridement of her Right foot by Dr. Odis Hollingshead Due to abscess noted on CT. She has been using iodoform packing. 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 She will complete 2 weeks of this and continue with oral amoxicillin for the next 6 to 12 months. She follows with Dr. Luciana Axe for this. She currently denies signs of infection. 11/13; patient presents for follow-up. Patient has been using silver alginate with gentamicin to the wound bed. She has no issues or complaints today she. She reports improvement in wound healing. 12/4; patient presents for follow-up. She has been using silver alginate and gentamicin to the wound bed. She has no issues or complaints today. 12/8; patient presents for follow-up. She has been using silver alginate with gentamicin to the wound bed. She presents for her first cast placement. She will be back early next week for her obligatory cast change. 12/12; patient presents for follow-up. We have been using collagen with antibiotic ointment to the wound bed under a total contact cast. She has tolerated this well. [name] improved in wound healing. 12/18; patient presents for follow-up. We have been using collagen with antibiotic ointment under the total contact cast. She has no complaints today. 1/2; The cast was placed at last clinic visit however patient missed her follow-up and this was taken off last week at a nurse visit. Patient has been using packing strips to the medial narrowed wound. She has noted no drainage. 1/22; patient presents for follow-up. She has been keeping the area covered. She  reports no drainage. She has had no issues to the previous wound site. She denies any signs of infection including increased warmth, erythema or purulent drainage. 08/08/2022 [name] [name] a 42 year old female with a past medical history of insulin-dependent uncontrolled type 2 diabetes with last hemoglobin A1c of 8.8, chronic osteomyelitis of the right foot status post transmetatarsal amputation on 12/18/2020 that has been previously treated for surgical dehiscence of this site in our clinic. She presents today 6 months after discharge from our clinic due to healed right foot wound with now reopening of the previous surgical site. She reports chronic pain to the site however denies purulent drainage, increased warmth or erythema. She noticed the wound opening about 1 month ago. She has been doing physical therapy without significant issues. She has inserts to her shoe however she has been advised to follow-up with [facility] for new custom inserts by her orthopedic surgeon now that she has a new wound. She states she will try and do this. She has been using antibiotic ointment to the wound bed. 8/1; patient presents for follow-up. She has been taking her antibiotics without issues. She has been using Dakin's wet-to-dry dressings to the  wound bed. The wound [name] smaller. She denies systemic signs of infection. 8/13; patient presents for follow-up. She had ABIs completed that were normal. On the right it was 1.02 and on the left was 0.99. She has been using silver alginate to the wound bed with gentamicin ointment. She states it [name] hard to pack the wound bed. She currently denies signs of infection. 8/29; patient presents for follow-up. She has been using collagen with antibiotic ointment to the wound bed. Wound [name] stable. She denies signs of infection. 9/5; patient presents for follow-up. She has been using silver alginate to the wound bed. Wound [name] slightly wider. She denies signs of  infection. 9/12; patient presents for follow-up. She has been using silver alginate to the wound bed. Wound [name] slightly smaller today. 9/19; right foot in the setting of a TMA over the first metatarsal head. She has thick callus around this area small fissured wound which actually looks like it [name] attempting to close. She has a offloading shoe I am not sure how much activity [name] in. I did not have a lot of time to see her today because of transportation 9/26; right foot in the setting of a previous TMA. The wound [name] over the plantar first metatarsal head. This [name] a fissured wound with thick callus around the margins. It [name] small in terms of overall surface area but has a depth of about 0.6 cm there was no palpable bone. She has a history of chronic osteomyelitis although she has been treated for this I have reviewed CT scans from 2023 also an MRI from 10/26/2021. Neither imaging study showed osteomyelitis in this area although the CT scan suggested osteomyelitis in the fourth metatarsal head. There has been problems with abscesses communicating with the wound although there does not appear to be purulent drainage currently 10/3; right foot at the TMA site. Small probing wound. I changed her to Iodosorb with gauze last time. May be some improvement. She has a depth of about 0.6 cm but there [name] no palpable bone. She has been previously treated for chronic osteomyelitis in this area. Objective [name], [name] ([id number]) [id number] Page 8 of 9 Constitutional Sitting or standing Blood Pressure [name] within target range for patient.. Pulse regular and within target range for patient.Marland Kitchen Respirations regular, non-labored and within target range.. Temperature [name] normal and within the target range for the patient.. Vitals Time Taken: 12:39 PM, Height: 69 in, Weight: 330 lbs, BMI: 48.7, Temperature: 98.1 F, Pulse: 77 bpm, Respiratory Rate: 16 breaths/min, Blood Pressure: 113/75  mmHg. General Notes: Wound exam; right foot over the first metatarsal head at the previous TMA. She has some callus around this and I actually removed some of this last week. I think the wound had gotten somewhat smaller in length and may be filling in in the distal part of it. I used a #3 curette to irritate the skins on the skin on the side of the relative depth of this. I am going to continue with the Iodoflex. The wound does not probe to bone and there [name] no evidence of infection Integumentary (Hair, Skin) Wound #4 status [name] Open. Original cause of wound was Shear/Friction. The date acquired was: 07/08/2022. The wound has been in treatment 10 weeks. The wound [name] located on the Right Amputation Site - Transmetatarsal. The wound measures 0.4cm length x 1.4cm width x 0.6cm depth; 0.44cm^2 area and 0.264cm^3 volume. There [name] Fat Layer (Subcutaneous Tissue) exposed. There [name] undermining starting at 6:00  and ending at 7:00 with a maximum distance of 0.3cm. There [name] a medium amount of serosanguineous drainage noted. The wound margin [name] distinct with the outline attached to the wound base. There [name] large (67-100%) red granulation within the wound bed. There [name] a small (1-33%) amount of necrotic tissue within the wound bed including Eschar and Adherent Slough. The periwound skin appearance exhibited: Callus, Maceration. The periwound skin appearance did not exhibit: Crepitus, Excoriation, Induration, Rash, Scarring, Dry/Scaly, Atrophie Blanche, Cyanosis, Ecchymosis, Hemosiderin Staining, Mottled, Pallor, Rubor, Erythema. The periwound has tenderness on palpation. Assessment Active Problems ICD-10 Non-pressure chronic ulcer of other part of right foot with fat layer exposed Type 2 diabetes mellitus with foot ulcer Other chronic osteomyelitis, right ankle and foot Procedures Wound #4 Pre-procedure diagnosis of Wound #4 [name] a Diabetic Wound/Ulcer of the Lower Extremity located on the Right Amputation Site  - Transmetatarsal .Severity of Tissue Pre Debridement [name]: Fat layer exposed. There was a Selective/Open Wound Skin/Epidermis Debridement with a total area of 0.44 sq cm performed by [name] Caul., [name]. With the following instrument(s): Curette to remove Viable and Non-Viable tissue/material. Material removed includes Slough, Skin: Dermis, and Skin: Epidermis after achieving pain control using Lidocaine 4% T opical Solution. No specimens were taken. A time out was conducted at 13:12, prior to the start of the procedure. A Minimum amount of bleeding was controlled with Pressure. The procedure was tolerated well with a pain level of 0 throughout and a pain level of 0 following the procedure. Post Debridement Measurements: 0.4cm length x 1.4cm width x 0.6cm depth; 0.264cm^3 volume. Character of Wound/Ulcer Post Debridement [name] improved. Severity of Tissue Post Debridement [name]: Fat layer exposed. Post procedure Diagnosis Wound #4: Same as Pre-Procedure Plan Follow-up Appointments: Return Appointment in 1 week. - Dr. Leanord Hawking Room 9 10/24/22 at 12:30 pm Other: - -Already DONE Vascular and vein appointment 08/21/22. T oral antibiotics call [facility] to get shoe adjusted. [phone] [address] [facility], [place]-DONE Anesthetic: (In clinic) Topical Lidocaine 4% applied to wound bed Cellular or Tissue Based Products: Wound #4 Right Amputation Site - Transmetatarsal: Cellular or Tissue Based Product Type: - RUN IVR for APLIGRAF (09/12/22)-SENT NOTES Bathing/ Shower/ Hygiene: May shower with protection but do not get wound dressing(s) wet. Protect dressing(s) with water repellant cover (for example, large plastic bag) or a cast cover and may then take shower. Other Bathing/Shower/Hygiene Orders/Instructions: - When changing the dressing (every other day) you may get right foot wet. On days NOT changing the dressing.Keep wound/dressing dry Edema Control - Lymphedema / SCD / Other: Elevate legs to  the level of the heart or above for 30 minutes daily and/or when sitting for 3-4 times a day throughout the day. Avoid standing for long periods of time. Off-Loading: Open toe surgical shoe to: WOUND #4: - Amputation Site - Transmetatarsal Wound Laterality: Right Cleanser: Soap and Water Every Other Day/30 Days Discharge Instructions: May shower and wash wound with dial antibacterial soap and water prior to dressing change. On days not changing the dressing, please keep wound/dressing dry Prim Dressing: Iodosorb Gel 10 (gm) Tube Every Other Day/30 Days ary Discharge Instructions: Soak Gauze with Iodosorb and place into wound bed.Apply to wound bed as instructed Secondary Dressing: ABD Pad, 8x10 Every Other Day/30 Days Discharge Instructions: Apply over primary dressing as directed. Secured With: [employer], 4.5x3.1 (in/yd) Every Other Day/30 Days PALMIRA, STICKLE ([id number]) [id number] Page 9 of 9 Discharge Instructions: Secure with Kerlix as directed. Secured With:  91M Medipore H Soft Cloth Surgical T ape, 4 x 10 (in/yd) Every Other Day/30 Days Discharge Instructions: Secure with tape as directed. Secured With: ace wrap Every Other Day/30 Days Discharge Instructions: apply lightly to hold dressing in place. 1. The wound perhaps [name] contracted some about although the depth [name] the same. I used a #3 curette to irritate the walls of the tunnel here. There was no purulent drainage and no tenderness. 2. I am continuing with the Iodoflex which in my experience has sometimes the additional benefit of pulling together small wounds with relative depth. 3. Fortunately no evidence of infection Electronic Signature(s) Signed: 10/17/2022 5:27:16 PM By: [name] Entered By: [name] on 10/17/2022 10:29:42 -------------------------------------------------------------------------------- SuperBill Details Patient Name: Date of Service: [name], [name] [name]  L. 10/17/2022 Medical Record Number: [id number] Patient Account Number: [id number] Date of Birth/Sex: Treating RN: 04-17-1980 (42 y.o. F) Primary Care Provider: Gwinda [name] Other Clinician: Referring Provider: Treating Provider/Extender: [name] in Treatment: 10 Diagnosis Coding ICD-10 Codes Code Description [phone] Non-pressure chronic ulcer of other part of right foot with fat layer exposed E11.621 Type 2 diabetes mellitus with foot ulcer M86.671 Other chronic osteomyelitis, right ankle and foot Facility Procedures : CPT4 Code: [id number] Description: 97597 - DEBRIDE WOUND 1ST 20 SQ CM OR < ICD-10 Diagnosis Description L97.512 Non-pressure chronic ulcer of other part of right foot with fat layer exposed E11.621 Type 2 diabetes mellitus with foot ulcer Modifier: Quantity: 1 Physician Procedures : CPT4 Code Description Modifier [id number] - WC PHYS DEBR WO ANESTH 20 SQ CM ICD-10 Diagnosis Description L97.512 Non-pressure chronic ulcer of other part of right foot with fat layer exposed E11.621 Type 2 diabetes mellitus with foot ulcer Quantity: 1 Electronic Signature(s) Signed: 10/17/2022 5:27:16 PM By: [name] Entered By: [name] on 10/17/2022 10:30:31

## 2022-10-24 ENCOUNTER — Ambulatory Visit (HOSPITAL_BASED_OUTPATIENT_CLINIC_OR_DEPARTMENT_OTHER): Payer: Medicaid Other | Admitting: General Surgery

## 2022-10-31 ENCOUNTER — Encounter (HOSPITAL_BASED_OUTPATIENT_CLINIC_OR_DEPARTMENT_OTHER): Payer: Medicaid Other | Admitting: Internal Medicine

## 2022-10-31 ENCOUNTER — Other Ambulatory Visit: Payer: Self-pay

## 2022-10-31 DIAGNOSIS — E11621 Type 2 diabetes mellitus with foot ulcer: Secondary | ICD-10-CM | POA: Diagnosis not present

## 2022-10-31 MED ORDER — DOXYCYCLINE MONOHYDRATE 100 MG PO CAPS
100.0000 mg | ORAL_CAPSULE | Freq: Two times a day (BID) | ORAL | 0 refills | Status: AC
  Filled 2022-10-31: qty 14, 7d supply, fill #0

## 2022-11-01 ENCOUNTER — Other Ambulatory Visit: Payer: Self-pay

## 2022-11-01 ENCOUNTER — Other Ambulatory Visit (HOSPITAL_BASED_OUTPATIENT_CLINIC_OR_DEPARTMENT_OTHER): Payer: Self-pay | Admitting: Internal Medicine

## 2022-11-01 ENCOUNTER — Encounter (HOSPITAL_BASED_OUTPATIENT_CLINIC_OR_DEPARTMENT_OTHER): Payer: Self-pay | Admitting: Internal Medicine

## 2022-11-01 ENCOUNTER — Ambulatory Visit (HOSPITAL_BASED_OUTPATIENT_CLINIC_OR_DEPARTMENT_OTHER)
Admission: RE | Admit: 2022-11-01 | Discharge: 2022-11-01 | Disposition: A | Discharge status: Home / Self Care | Payer: Medicaid Other | Source: Ambulatory Visit | Attending: Internal Medicine | Admitting: Internal Medicine

## 2022-11-01 DIAGNOSIS — L089 Local infection of the skin and subcutaneous tissue, unspecified: Secondary | ICD-10-CM

## 2022-11-01 DIAGNOSIS — E11621 Type 2 diabetes mellitus with foot ulcer: Secondary | ICD-10-CM

## 2022-11-05 LAB — AEROBIC/ANAEROBIC CULTURE W GRAM STAIN (SURGICAL/DEEP WOUND): Gram Stain: NONE SEEN

## 2022-11-05 NOTE — Progress Notes (Signed)
[name], [name] ([id number]) [id number] Page 1 of 11 Visit Report for 10/31/2022 Debridement Details Patient Name: Date of Service: [name] IS, [place] [name]. 10/31/2022 12:30 PM Medical Record Number: [id number] Patient Account Number: [id number] Date of Birth/Sex: Treating RN: 06-29-1980 (42 y.o. F) Primary Care Provider: Gwinda Passe Other Clinician: Referring Provider: Treating Provider/Extender: Aurelio Brash in Treatment: 12 Debridement Performed for Assessment: Wound #4 Right Amputation Site - Transmetatarsal Performed By: Physician Maxwell Caul., MD The following information was scribed by: Shawn Stall The information was scribed for: Baltazar Najjar Debridement Type: Debridement Severity of Tissue Pre Debridement: Fat layer exposed Level of Consciousness (Pre-procedure): Awake and Alert Pre-procedure Verification/Time Out Yes - 13:15 Taken: Start Time: 13:16 Pain Control: Lidocaine 4% T opical Solution Percent of Wound Bed Debrided: 100% T Area Debrided (cm): otal 1.98 Tissue and other material debrided: Viable, Non-Viable, Bone, Callus, Slough, Subcutaneous, Skin: Dermis , Skin: Epidermis, Slough Level: Skin/Subcutaneous Tissue/Muscle/Bone Debridement Description: Excisional Instrument: Curette Specimen: Tissue Culture Number of Specimens T aken: 1 Bleeding: Minimum Hemostasis Achieved: Pressure End Time: 13:19 Procedural Pain: 0 Post Procedural Pain: 0 Response to Treatment: Procedure was tolerated well Level of Consciousness (Post- Awake and Alert procedure): Post Debridement Measurements of Total Wound Length: (cm) 2.8 Width: (cm) 0.9 Depth: (cm) 1 Volume: (cm) 1.979 Character of Wound/Ulcer Post Debridement: Requires Further Debridement Severity of Tissue Post Debridement: Fat layer exposed Post Procedure Diagnosis Same as Pre-procedure Electronic Signature(s) Signed: 11/04/2022 6:11:24 PM By:  Baltazar Najjar MD Entered By: Baltazar Najjar on 10/31/2022 13:21:38 -------------------------------------------------------------------------------- HPI Details Patient Name: Date of Service: [name] [name] Kitten, MA [name]. 10/31/2022 12:30 PM Medical Record Number: [id number] Patient Account Number: [id number] Date of Birth/Sex: Treating RN: [birth year] (42 y.o. F) Primary Care Provider: Gwinda Passe Other Clinician: JHENESIS, [name] ([id number]) [id number] Page 2 of 11 Referring Provider: Treating Provider/Extender: Aurelio Brash in Treatment: 12 History of Present Illness HPI Description: Admission 01/22/2021 [name] is a 42 year old female with a past medical history of insulin-dependent uncontrolled type 2 diabetes with last hemoglobin A1c of 13.5, osteomyelitis of the right foot status post transmetatarsal amputation on 12/18/2020 that presents to the clinic for right foot wound. She has had dehiscence of the surgical site. She is currently using wet-to-dry dressings. She has a PICC line and receiving IV ceftriaxone daily for her osteomyelitis. There is an end date of 01/27/2021. She is also taking oral metronidazole. She currently denies systemic signs of infection. 1/19; patient presents for follow-up. She was diagnosed with a DVT to the right lower extremity 2 days ago. She is on Eliquis now. She is scheduled to see her infectious disease doctor tomorrow. She has been using Dakin's wet-to-dry dressings. She denies systemic signs of infection. 1/26; patient presents for follow-up. She saw infectious disease on 1/21 started on Augmentin. Her PICC line and IV ceftriaxone was discontinued. Patient reports stability to her wound. She has been using Dakin's wet-to-dry dressings. She currently denies systemic signs of infection. 2/3; patient presents for follow-up. She continues to use Dakin's wet-to-dry dressings to the wound bed. She saw  Dr. Manson Passey with infectious disease yesterday and is continuing Augmentin. T entative end date is 2/16. Patient reports following up with orthopedics. She states there is no further plan from them. She currently denies systemic signs of infection. 2/10; patient presents for follow-up. She continues to use Dakin's wet to dry dressings. She is scheduled to have her MRI done on 2/14. She states  that she had pain to the debridement site from last clinic visit and declines debridement today. She denies systemic signs of infection. She continues to have yellow thick drainage. 2/20; patient presents for follow-up. She continues to use Dakin's wet-to-dry dressings. She obtained her MRI. The results showed an abscess and she is scheduled to see her orthopedic surgeon on 2/23. She saw infectious disease 2/17 and her antibiotics were extended. She currently denies systemic signs of infection. 3/6; patient presents for follow-up. She had debridement and irrigation of her foot on 03/10/2021 due to abscess noted on MRI. She was started on IV ceftriaxone and oral Flagyl. She has no issues or complaints today. She has been using iodoform packing to the tunnel and Dakin's wet-to-dry to the opening. 03/26/2021: She continues on IV ceftriaxone and oral metronidazole. She has follow-up with infectious disease tomorrow. No significant issues or complaints today. Her mother continues to help her with her wound dressing, using iodoform packing strips into the tunnel and Dakin's to the open portion of the wound. 3/20; patient presents for follow-up. She continues to be on IV ceftriaxone in oral metronidazole. She has been using iodoform to the tunnel and Dakin's wet-to- dry to the open wound. She denies signs of infection. 3/27; patient presents for follow-up. She no longer has a PICC line. She has been using iodoform to the tunnel and Dakin's wet-to-dry to the open wound. She reports improvement in wound healing. She denies  signs of infection. 4/3; patient presents for follow-up. She states she has been using Hydrofera Blue to the open wound and iodoform packing to the tunnel without any issues. She denies signs of infection. 4/18; patient presents for follow-up. She saw infectious disease on 4/11. She has finished her oral antibiotics and completed a total of 6 weeks of antibiotics (this includes IV as well). No further antibiotics needed. She has been using Hydrofera Blue and iodoform packing. She states that the tunneled area has come in and the iodoform is not staying in place anymore. She has no issues or complaints today. She denies signs of infection. 4/24; patient presents for follow-up. She saw Dr. Carlene Coria, plastic surgery to discuss potential skin graft/substitute placement. At this time he thinks that the skin graft would likely not take. He is in agreement with trying a wound VAC. Patient has been using Hydrofera Blue dressing changes with no issues. She denies signs of infection. She reports improvement in wound healing. 5/1; patient presents for follow-up. Unfortunately patient did not have insurance when we ran for the pico. There is an assistance program and we are trying to get this accommodated for the patient. In the meantime she has been using Hydrofera Blue without any issues. She denies signs of infection. 5/8; patient presents for follow-up. We have not heard back if pico is covered by her insurance. She has been using collagen to the wound bed over the past week. She denies signs of infection. 5/18; patient presents for follow-up. She has been using collagen to the wound bed without issues. Again we have not heard if pico is covered by her insurance. She has no issues or complaints today. 5/23; patient presents for follow-up. She has been using collagen to the wound bed. She has no issues or complaints today. She obtained the wound VAC from [facility] and brought this in today. She denies signs of  infection. 6/1; patient presents for follow-up. She has been using the wound VAC for the past week. She has had this changed twice  since she was last here. She reports more maceration to the periwound. She denies signs of infection. 6/7; right TMA site. There are 2 wounds 0 separated by a bridge of healed tissue. The more lateral area has undermining. Both areas have healthy looking granulation at the base but relative the size of the wound is fairly deep. There is no exposed bone no evidence of infection. Her wound VAC was put on hold last week because of surrounding skin maceration she has been using collagen this week. She has a modified shoe 6/12; patient presents for follow-up. Last week the wound VAC was reinitiated. She had no issues with the wound VAC itself. Today she has maceration again noted to the surrounding skin. She denies signs of infection. 6/27; patient presents for follow-up. She has been using Medihoney to the wound bed. We took a break from the wound VAC because the periwound was macerated. She still has some areas of maceration to the distal foot where there is a callus. She currently denies signs of infection. 7/11; patient presents for follow-up. She has been using Medihoney to the wound bed. She has developed some increased warmth and erythema to the lateral aspect of the right foot. She states this is occurred over the past week and there is increased pain. No drainage noted. 7/17; patient presents for follow-up. She has been using Medihoney to the wound bed. She completed her course of Bactrim. She reports improvement in symptoms. 7/24; Patient presents for follow up. She has been using Medihoney to the wound bed without issues. She completed another course of Bactrim. She reports improvement in her symptoms but still has some mild tenderness to the medial aspect of the foot. 7/31; Patient presents for follow-up. She has been using Medihoney and Dakin's to the wound bed.  She denies signs of infection. [name], [name] ([id number]) [id number] Page 3 of 11 8/14; patient presents for follow-up. She has been using Dakin's wet-to-dry packing strips to the right medial aspect of the amputation site and Medihoney to the anterior site. She has no issues or complaints today. She has started physical therapy. She denies signs of infection. 8/29; patient presents for follow-up. She has been using Dakin's wet-to-dry packing strips to the right medial aspect of the amputation site however this is becoming more difficult to place. She did report that she had increased redness and swelling to that site and developed some drainage. It has resolved. She continues with physical therapy. 9/8; patient presents for follow-up. We have been using silver alginate to the tunneled area. She completed her course of antibiotics. She reports no pain, increased swelling or erythema. 9/21; patient presents for follow-up. She has been using gentamicin to the tunneled area. She has no issues or complaints today. She denies signs of infection. 10/2; patient presents for follow-up. She is scheduled to have her CT scan on 10/9. She currently denies signs of infection. She denies increased warmth, erythema or purulent drainage from the wound bed. She has been using Dakin's wet-to-dry dressings. 10/12; patient presents for follow-up. She had her CT scan on 10/9 that showed A small irregular rim-enhancing fluid pocket communicating to the overlying soft tissues of the sinus tract compatible with a small abscess. Currently she denies systemic signs of infection. She has been doing Dakin's wet-to-dry packing strips but it is hard for her to pack into the narrow opening. 10/30; patient presents for follow-up. Since last clinic visit she has had OR debridement of her Right foot by Dr.  Odis Hollingshead Due to abscess noted on CT. She has been using iodoform packing. 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 She will complete 2 weeks of this and continue with oral amoxicillin for the next 6 to 12 months. She follows with Dr. Luciana Axe for this. She currently denies signs of infection. 11/13; patient presents for follow-up. Patient has been using silver alginate with gentamicin to the wound bed. She has no issues or complaints today she. She reports improvement in wound healing. 12/4; patient presents for follow-up. She has been using silver alginate and gentamicin to the wound bed. She has no issues or complaints today. 12/8; patient presents for follow-up. She has been using silver alginate with gentamicin to the wound bed. She presents for her first cast placement. She will be back early next week for her obligatory cast change. 12/12; patient presents for follow-up. We have been using collagen with antibiotic ointment to the wound bed under a total contact cast. She has tolerated this well. She is improved in wound healing. 12/18; patient presents for follow-up. We have been using collagen with antibiotic ointment under the total contact cast. She has no complaints today. 1/2; The cast was placed at last clinic visit however patient missed her follow-up and this was taken off last week at a nurse visit. Patient has been using packing strips to the medial narrowed wound. She has noted no drainage. 1/22; patient presents for follow-up. She has been keeping the area covered. She reports no drainage. She has had no issues to the previous wound site. She denies any signs of infection including increased warmth, erythema or purulent drainage. 08/08/2022 [name] is a 42 year old female with a past medical history of insulin-dependent uncontrolled type 2 diabetes with last hemoglobin A1c of 8.8, chronic osteomyelitis of the right foot status post transmetatarsal amputation on 12/18/2020 that has been previously treated for surgical dehiscence of  this site in our clinic. She presents today 6 months after discharge from our clinic due to healed right foot wound with now reopening of the previous surgical site. She reports chronic pain to the site however denies purulent drainage, increased warmth or erythema. She noticed the wound opening about 1 month ago. She has been doing physical therapy without significant issues. She has inserts to her shoe however she has been advised to follow-up with [employer] for new custom inserts by her orthopedic surgeon now that she has a new wound. She states she will try and do this. She has been using antibiotic ointment to the wound bed. 8/1; patient presents for follow-up. She has been taking her antibiotics without issues. She has been using Dakin's wet-to-dry dressings to the wound bed. The wound is smaller. She denies systemic signs of infection. 8/13; patient presents for follow-up. She had ABIs completed that were normal. On the right it was 1.02 and on the left was 0.99. She has been using silver alginate to the wound bed with gentamicin ointment. She states it is hard to pack the wound bed. She currently denies signs of infection. 8/29; patient presents for follow-up. She has been using collagen with antibiotic ointment to the wound bed. Wound is stable. She denies signs of infection. 9/5; patient presents for follow-up. She has been using silver alginate to the wound bed. Wound is slightly wider. She denies signs of infection. 9/12; patient presents for follow-up. She has been using silver alginate to the wound bed. Wound is slightly smaller today. 9/19;  right foot in the setting of a TMA over the first metatarsal head. She has thick callus around this area small fissured wound which actually looks like it is attempting to close. She has a offloading shoe I am not sure how much activity she is in. I did not have a lot of time to see her today because of transportation 9/26; right foot in the setting of  a previous TMA. The wound is over the plantar first metatarsal head. This is a fissured wound with thick callus around the margins. It is small in terms of overall surface area but has a depth of about 0.6 cm there was no palpable bone. She has a history of chronic osteomyelitis although she has been treated for this I have reviewed CT scans from 2023 also an MRI from 10/26/2021. Neither imaging study showed osteomyelitis in this area although the CT scan suggested osteomyelitis in the fourth metatarsal head. There has been problems with abscesses communicating with the wound although there does not appear to be purulent drainage currently 10/3; right foot at the TMA site. Small probing wound. I changed her to Iodosorb with gauze last time. May be some improvement. She has a depth of about 0.6 cm but there is no palpable bone. She has been previously treated for chronic osteomyelitis in this area. 10/17; this is a patient with a probing wound at the right TMA site. Underlying osteomyelitis previously treated earlier this year. I been using Iodosorb to try and help constrict this wound however today she comes in with a larger circumference a larger tunnel and this probably sits right on top of bone. Also worrisome is there was a streaking dark area just caudal to the wound on the dorsal surface of the foot. The patient is complaining of more pain but no fever. Electronic Signature(s) Signed: 11/04/2022 6:11:24 PM By: Baltazar Najjar MD Entered By: Baltazar Najjar on 10/31/2022 13:23:25 Brach, Sharol Harness ([id number]) [id number] Page 4 of 11 -------------------------------------------------------------------------------- Physical Exam Details Patient Name: Date of Service: [name] IS, [place] [name]. 10/31/2022 12:30 PM Medical Record Number: [id number] Patient Account Number: [id number] Date of Birth/Sex: Treating RN: 1980/09/17 (42 y.o. F) Primary Care Provider: Gwinda Passe  Other Clinician: Referring Provider: Treating Provider/Extender: Aurelio Brash in Treatment: 12 Constitutional Sitting or standing Blood Pressure is within target range for patient.. Pulse regular and within target range for patient.Marland Kitchen Respirations regular, non-labored and within target range.. Temperature is normal and within the target range for the patient.Marland Kitchen Appears in no distress. Notes Wound exam; right foot over the first metatarsal head at the previous TMA. She has thick callus around the wound which seems larger than what I remember from 2 weeks ago. This probes straight down there is minimal to no tissue over the met head. There is no purulent drainage that I could identify. On the dorsal aspect of the TMA extending a short distance caudally is what I think is streaking. I used a #3 curette for a bone scraping of material for [employer]) Signed: 11/04/2022 6:11:24 PM By: Baltazar Najjar MD Entered By: Baltazar Najjar on 10/31/2022 13:25:29 -------------------------------------------------------------------------------- Physician Orders Details Patient Name: Date of Service: [name] V IS, MA [name]. 10/31/2022 12:30 PM Medical Record Number: [id number] Patient Account Number: [id number] Date of Birth/Sex: Treating RN: 1980-12-31 (42 y.o. [name] Primary Care Provider: Gwinda Passe Other Clinician: Referring Provider: Treating Provider/Extender: Aurelio Brash in Treatment: 12 The following information was scribed by:  Shawn Stall The information was scribed for: Baltazar Najjar Verbal / Phone Orders: No Diagnosis Coding ICD-10 Coding Code Description L97.512 Non-pressure chronic ulcer of other part of right foot with fat layer exposed E11.621 Type 2 diabetes mellitus with foot ulcer M86.671 Other chronic osteomyelitis, right ankle and foot Follow-up Appointments ppointment in 1 week. - Dr.  Leanord Hawking Return A ppointment in 2 weeks. - Dr. Leanord Hawking Return A Other: - Pick up oral antibiotics and topical ointment from pharmacy. Obtain x-ray any outpatient facility of [facility]. Anesthetic (In clinic) Topical Lidocaine 4% applied to wound bed Cellular or Tissue Based Products Wound #4 Right Amputation Site - Transmetatarsal Cellular or Tissue Based Product Type: - RUN IVR for APLIGRAF (09/12/22)-SENT NOTES Bathing/ Shower/ Hygiene May shower with protection but do not get wound dressing(s) wet. Protect dressing(s) with water repellant cover (for example, large plastic bag) or a cast cover and may then take shower. MONISE, ASATO ([id number]) [id number] Page 5 of 11 Other Bathing/Shower/Hygiene Orders/Instructions: - When changing the dressing (every other day) you may get right foot wet. On days NOT changing the dressing.Keep wound/dressing dry Edema Control - Lymphedema / SCD / Other Elevate legs to the level of the heart or above for 30 minutes daily and/or when sitting for 3-4 times a day throughout the day. Avoid standing for long periods of time. Off-Loading Open toe surgical shoe to: Wound Treatment Wound #4 - Amputation Site - Transmetatarsal Wound Laterality: Right Cleanser: Soap and Water Every Other Day/30 Days Discharge Instructions: May shower and wash wound with dial antibacterial soap and water prior to dressing change. On days not changing the dressing, please keep wound/dressing dry Topical: Gentamicin Every Other Day/30 Days Discharge Instructions: As directed by physician Prim Dressing: Maxorb Extra Ag+ Alginate Dressing, 4x4.75 (in/in) Every Other Day/30 Days ary Discharge Instructions: Apply to wound bed as instructed Secondary Dressing: ABD Pad, 8x10 Every Other Day/30 Days Discharge Instructions: Apply over primary dressing as directed. Secured With: [employer], 4.5x3.1 (in/yd) Every Other Day/30 Days Discharge Instructions:  Secure with Kerlix as directed. Secured With: 36M Medipore H Soft Cloth Surgical T ape, 4 x 10 (in/yd) Every Other Day/30 Days Discharge Instructions: Secure with tape as directed. Secured With: ace wrap Every Other Day/30 Days Discharge Instructions: apply lightly to hold dressing in place. Laboratory naerobe culture (MICRO) - Regular culture sent to hospital of right foot wound. - (ICD10 E11.621 Bacteria identified in Unspecified specimen by A - Type 2 diabetes mellitus with foot ulcer) LOINC Code: 635-3 Convenience Name: Anaerobic culture Radiology X-ray, foot right - x-ray right foot related to non healing diabetic foot ulcer looking for infection. - (ICD10 E11.621 - Type 2 diabetes mellitus with foot ulcer) Patient Medications llergies: No Known Drug Allergies A Notifications Medication Indication Start End wound infection 10/31/2022 doxycycline monohydrate DOSE oral 100 mg capsule - 1 capsule oral twice a day for 7 days Electronic Signature(s) Signed: 10/31/2022 1:29:17 PM By: Baltazar Najjar MD Entered By: Baltazar Najjar on 10/31/2022 13:29:17 Prescription 10/31/2022 -------------------------------------------------------------------------------- Komorowski, [name]. Baltazar Najjar MD Patient Name: Provider: [birth year] [id number] Date of Birth: NPI#: [id number] Sex: DEA #: [phone] Phone #: License #: [id number] UPN: [name], [name] ([id number]) [id number] Page 6 of 11 Patient Address: [address] [facility] [place] [address] , [address] [place], [place] [address] [phone] Allergies No Known Drug Allergies Provider's Orders X-ray, foot right - ICD10: E11.621 - x-ray  right foot related to non healing diabetic foot ulcer looking for infection. Hand Signature: Date(s): Prescription 10/31/2022 [name], [name]. Baltazar Najjar MD Patient Name: Provider: [birth year]  [id number] Date of Birth: NPI#: [id number] Sex: DEA #: [phone] [id number] Phone #: License #: [id number] UPN: Patient Address: [address] Eligha [facility] [place] [address] , [address] Floor [place], [place] [address] [phone] Allergies No Known Drug Allergies Provider's Orders naerobe culture - ICD10: E11.621 - Regular culture sent to hospital of right foot wound. Bacteria identified in Unspecified specimen by A LOINC Code: 635-3 Convenience Name: Anaerobic culture Hand Signature: Date(s): Electronic Signature(s) Signed: 11/04/2022 6:11:24 PM By: Baltazar Najjar MD Entered By: Baltazar Najjar on 10/31/2022 13:29:18 -------------------------------------------------------------------------------- Problem List Details Patient Name: Date of Service: [name] V IS, MA [name]. 10/31/2022 12:30 PM Medical Record Number: [id number] Patient Account Number: [id number] Date of Birth/Sex: Treating RN: 23-Apr-1980 (42 y.o. [name] Primary Care Provider: Gwinda Passe Other Clinician: Referring Provider: Treating Provider/Extender: Aurelio Brash in Treatment: 12 Active Problems ICD-10 Encounter Code Description Active Date MDM Diagnosis [phone] Non-pressure chronic ulcer of other part of right foot with fat layer exposed 08/08/2022 No Yes Colomb, [name] ([id number]) [id number] Page 7 of 11 E11.621 Type 2 diabetes mellitus with foot ulcer 08/08/2022 No Yes M86.671 Other chronic osteomyelitis, right ankle and foot 08/08/2022 No Yes Inactive Problems Resolved Problems Electronic Signature(s) Signed: 11/04/2022 6:11:24 PM By: Baltazar Najjar MD Entered By: Baltazar Najjar on 10/31/2022 13:21:14 -------------------------------------------------------------------------------- Progress Note Details Patient Name: Date of Service: [name] [name] Kitten, MA [name]. 10/31/2022 12:30  PM Medical Record Number: [id number] Patient Account Number: [id number] Date of Birth/Sex: Treating RN: 1980-11-11 (42 y.o. F) Primary Care Provider: Gwinda Passe Other Clinician: Referring Provider: Treating Provider/Extender: Aurelio Brash in Treatment: 12 Subjective History of Present Illness (HPI) Admission 01/22/2021 Ms. Jeweline Breitling is a 42 year old female with a past medical history of insulin-dependent uncontrolled type 2 diabetes with last hemoglobin A1c of 13.5, osteomyelitis of the right foot status post transmetatarsal amputation on 12/18/2020 that presents to the clinic for right foot wound. She has had dehiscence of the surgical site. She is currently using wet-to-dry dressings. She has a PICC line and receiving IV ceftriaxone daily for her osteomyelitis. There is an end date of 01/27/2021. She is also taking oral metronidazole. She currently denies systemic signs of infection. 1/19; patient presents for follow-up. She was diagnosed with a DVT to the right lower extremity 2 days ago. She is on Eliquis now. She is scheduled to see her infectious disease doctor tomorrow. She has been using Dakin's wet-to-dry dressings. She denies systemic signs of infection. 1/26; patient presents for follow-up. She saw infectious disease on 1/21 started on Augmentin. Her PICC line and IV ceftriaxone was discontinued. Patient reports stability to her wound. She has been using Dakin's wet-to-dry dressings. She currently denies systemic signs of infection. 2/3; patient presents for follow-up. She continues to use Dakin's wet-to-dry dressings to the wound bed. She saw Dr. Manson Passey with infectious disease yesterday and is continuing Augmentin. T entative end date is 2/16. Patient reports following up with orthopedics. She states there is no further plan from them. She currently denies systemic signs of infection. 2/10; patient presents for follow-up. She continues to use  Dakin's wet to dry dressings. She is scheduled to have her MRI done on 2/14. She states that she had pain to the debridement  site from last clinic visit and declines debridement today. She denies systemic signs of infection. She continues to have yellow thick drainage. 2/20; patient presents for follow-up. She continues to use Dakin's wet-to-dry dressings. She obtained her MRI. The results showed an abscess and she is scheduled to see her orthopedic surgeon on 2/23. She saw infectious disease 2/17 and her antibiotics were extended. She currently denies systemic signs of infection. 3/6; patient presents for follow-up. She had debridement and irrigation of her foot on 03/10/2021 due to abscess noted on MRI. She was started on IV ceftriaxone and oral Flagyl. She has no issues or complaints today. She has been using iodoform packing to the tunnel and Dakin's wet-to-dry to the opening. 03/26/2021: She continues on IV ceftriaxone and oral metronidazole. She has follow-up with infectious disease tomorrow. No significant issues or complaints today. Her mother continues to help her with her wound dressing, using iodoform packing strips into the tunnel and Dakin's to the open portion of the wound. 3/20; patient presents for follow-up. She continues to be on IV ceftriaxone in oral metronidazole. She has been using iodoform to the tunnel and Dakin's wet-to- dry to the open wound. She denies signs of infection. 3/27; patient presents for follow-up. She no longer has a PICC line. She has been using iodoform to the tunnel and Dakin's wet-to-dry to the open wound. She reports improvement in wound healing. She denies signs of infection. 4/3; patient presents for follow-up. She states she has been using Hydrofera Blue to the open wound and iodoform packing to the tunnel without any issues. She denies signs of infection. 4/18; patient presents for follow-up. She saw infectious disease on 4/11. She has finished her oral  antibiotics and completed a total of 6 weeks of antibiotics (this includes IV as well). No further antibiotics needed. She has been using Hydrofera Blue and iodoform packing. She states that the tunneled area has come in and the iodoform is not staying in place anymore. She has no issues or complaints today. She denies signs of infection. 4/24; patient presents for follow-up. She saw Dr. Carlene Coria, plastic surgery to discuss potential skin graft/substitute placement. At this time he thinks that the skin graft would likely not take. He is in agreement with trying a wound VAC. Patient has been using Hydrofera Blue dressing changes with no issues. She denies signs of infection. She reports improvement in wound healing. KANESIA, VANTREASE ([id number]) [id number] Page 8 of 11 5/1; patient presents for follow-up. Unfortunately patient did not have insurance when we ran for the pico. There is an assistance program and we are trying to get this accommodated for the patient. In the meantime she has been using Hydrofera Blue without any issues. She denies signs of infection. 5/8; patient presents for follow-up. We have not heard back if pico is covered by her insurance. She has been using collagen to the wound bed over the past week. She denies signs of infection. 5/18; patient presents for follow-up. She has been using collagen to the wound bed without issues. Again we have not heard if pico is covered by her insurance. She has no issues or complaints today. 5/23; patient presents for follow-up. She has been using collagen to the wound bed. She has no issues or complaints today. She obtained the wound VAC from [facility] and brought this in today. She denies signs of infection. 6/1; patient presents for follow-up. She has been using the wound VAC for the past week. She has had this changed  twice since she was last here. She reports more maceration to the periwound. She denies signs of  infection. 6/7; right TMA site. There are 2 wounds 0 separated by a bridge of healed tissue. The more lateral area has undermining. Both areas have healthy looking granulation at the base but relative the size of the wound is fairly deep. There is no exposed bone no evidence of infection. Her wound VAC was put on hold last week because of surrounding skin maceration she has been using collagen this week. She has a modified shoe 6/12; patient presents for follow-up. Last week the wound VAC was reinitiated. She had no issues with the wound VAC itself. Today she has maceration again noted to the surrounding skin. She denies signs of infection. 6/27; patient presents for follow-up. She has been using Medihoney to the wound bed. We took a break from the wound VAC because the periwound was macerated. She still has some areas of maceration to the distal foot where there is a callus. She currently denies signs of infection. 7/11; patient presents for follow-up. She has been using Medihoney to the wound bed. She has developed some increased warmth and erythema to the lateral aspect of the right foot. She states this is occurred over the past week and there is increased pain. No drainage noted. 7/17; patient presents for follow-up. She has been using Medihoney to the wound bed. She completed her course of Bactrim. She reports improvement in symptoms. 7/24; Patient presents for follow up. She has been using Medihoney to the wound bed without issues. She completed another course of Bactrim. She reports improvement in her symptoms but still has some mild tenderness to the medial aspect of the foot. 7/31; Patient presents for follow-up. She has been using Medihoney and Dakin's to the wound bed. She denies signs of infection. 8/14; patient presents for follow-up. She has been using Dakin's wet-to-dry packing strips to the right medial aspect of the amputation site and Medihoney to the anterior site. She has no  issues or complaints today. She has started physical therapy. She denies signs of infection. 8/29; patient presents for follow-up. She has been using Dakin's wet-to-dry packing strips to the right medial aspect of the amputation site however this is becoming more difficult to place. She did report that she had increased redness and swelling to that site and developed some drainage. It has resolved. She continues with physical therapy. 9/8; patient presents for follow-up. We have been using silver alginate to the tunneled area. She completed her course of antibiotics. She reports no pain, increased swelling or erythema. 9/21; patient presents for follow-up. She has been using gentamicin to the tunneled area. She has no issues or complaints today. She denies signs of infection. 10/2; patient presents for follow-up. She is scheduled to have her CT scan on 10/9. She currently denies signs of infection. She denies increased warmth, erythema or purulent drainage from the wound bed. She has been using Dakin's wet-to-dry dressings. 10/12; patient presents for follow-up. She had her CT scan on 10/9 that showed A small irregular rim-enhancing fluid pocket communicating to the overlying soft tissues of the sinus tract compatible with a small abscess. Currently she denies systemic signs of infection. She has been doing Dakin's wet-to-dry packing strips but it is hard for her to pack into the narrow opening. 10/30; patient presents for follow-up. Since last clinic visit she has had OR debridement of her Right foot by Dr. Odis Hollingshead Due to abscess noted on CT.  She has been using iodoform packing. 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 She will complete 2 weeks of this and continue with oral amoxicillin for the next 6 to 12 months. She follows with Dr. Luciana Axe for this. She currently denies signs of infection. 11/13; patient presents for follow-up. Patient has been using  silver alginate with gentamicin to the wound bed. She has no issues or complaints today she. She reports improvement in wound healing. 12/4; patient presents for follow-up. She has been using silver alginate and gentamicin to the wound bed. She has no issues or complaints today. 12/8; patient presents for follow-up. She has been using silver alginate with gentamicin to the wound bed. She presents for her first cast placement. She will be back early next week for her obligatory cast change. 12/12; patient presents for follow-up. We have been using collagen with antibiotic ointment to the wound bed under a total contact cast. She has tolerated this well. She is improved in wound healing. 12/18; patient presents for follow-up. We have been using collagen with antibiotic ointment under the total contact cast. She has no complaints today. 1/2; The cast was placed at last clinic visit however patient missed her follow-up and this was taken off last week at a nurse visit. Patient has been using packing strips to the medial narrowed wound. She has noted no drainage. 1/22; patient presents for follow-up. She has been keeping the area covered. She reports no drainage. She has had no issues to the previous wound site. She denies any signs of infection including increased warmth, erythema or purulent drainage. 08/08/2022 [name] is a 42 year old female with a past medical history of insulin-dependent uncontrolled type 2 diabetes with last hemoglobin A1c of 8.8, chronic osteomyelitis of the right foot status post transmetatarsal amputation on 12/18/2020 that has been previously treated for surgical dehiscence of this site in our clinic. She presents today 6 months after discharge from our clinic due to healed right foot wound with now reopening of the previous surgical site. She reports chronic pain to the site however denies purulent drainage, increased warmth or erythema. She noticed the wound opening  about 1 month ago. She has been doing physical therapy without significant issues. She has inserts to her shoe however she has been advised to follow-up with [employer] for new custom inserts by her orthopedic surgeon now that she has a new wound. She states she will try and do this. She has been using antibiotic ointment to the wound bed. 8/1; patient presents for follow-up. She has been taking her antibiotics without issues. She has been using Dakin's wet-to-dry dressings to the wound bed. The wound is smaller. She denies systemic signs of infection. [name], [name] ([id number]) [id number] Page 9 of 11 8/13; patient presents for follow-up. She had ABIs completed that were normal. On the right it was 1.02 and on the left was 0.99. She has been using silver alginate to the wound bed with gentamicin ointment. She states it is hard to pack the wound bed. She currently denies signs of infection. 8/29; patient presents for follow-up. She has been using collagen with antibiotic ointment to the wound bed. Wound is stable. She denies signs of infection. 9/5; patient presents for follow-up. She has been using silver alginate to the wound bed. Wound is slightly wider. She denies signs of infection. 9/12; patient presents for follow-up. She has been using silver alginate to the wound bed. Wound is slightly smaller  today. 9/19; right foot in the setting of a TMA over the first metatarsal head. She has thick callus around this area small fissured wound which actually looks like it is attempting to close. She has a offloading shoe I am not sure how much activity she is in. I did not have a lot of time to see her today because of transportation 9/26; right foot in the setting of a previous TMA. The wound is over the plantar first metatarsal head. This is a fissured wound with thick callus around the margins. It is small in terms of overall surface area but has a depth of about 0.6 cm there  was no palpable bone. She has a history of chronic osteomyelitis although she has been treated for this I have reviewed CT scans from 2023 also an MRI from 10/26/2021. Neither imaging study showed osteomyelitis in this area although the CT scan suggested osteomyelitis in the fourth metatarsal head. There has been problems with abscesses communicating with the wound although there does not appear to be purulent drainage currently 10/3; right foot at the TMA site. Small probing wound. I changed her to Iodosorb with gauze last time. May be some improvement. She has a depth of about 0.6 cm but there is no palpable bone. She has been previously treated for chronic osteomyelitis in this area. 10/17; this is a patient with a probing wound at the right TMA site. Underlying osteomyelitis previously treated earlier this year. I been using Iodosorb to try and help constrict this wound however today she comes in with a larger circumference a larger tunnel and this probably sits right on top of bone. Also worrisome is there was a streaking dark area just caudal to the wound on the dorsal surface of the foot. The patient is complaining of more pain but no fever. Objective Constitutional Sitting or standing Blood Pressure is within target range for patient.. Pulse regular and within target range for patient.Marland Kitchen Respirations regular, non-labored and within target range.. Temperature is normal and within the target range for the patient.Marland Kitchen Appears in no distress. Vitals Time Taken: 12:49 PM, Height: 69 in, Weight: 330 lbs, BMI: 48.7, Temperature: 97.9 F, Pulse: 85 bpm, Respiratory Rate: 18 breaths/min, Blood Pressure: 126/85 mmHg. General Notes: Wound exam; right foot over the first metatarsal head at the previous TMA. She has thick callus around the wound which seems larger than what I remember from 2 weeks ago. This probes straight down there is minimal to no tissue over the met head. There is no purulent drainage  that I could identify. On the dorsal aspect of the TMA extending a short distance caudally is what I think is streaking. I used a #3 curette for a bone scraping of material for CandS Integumentary (Hair, Skin) Wound #4 status is Open. Original cause of wound was Shear/Friction. The date acquired was: 07/08/2022. The wound has been in treatment 12 weeks. The wound is located on the Right Amputation Site - Transmetatarsal. The wound measures 2.8cm length x 0.9cm width x 1cm depth; 1.979cm^2 area and 1.979cm^3 volume. There is Fat Layer (Subcutaneous Tissue) exposed. There is no tunneling or undermining noted. There is a medium amount of serosanguineous drainage noted. The wound margin is distinct with the outline attached to the wound base. There is medium (34-66%) red granulation within the wound bed. There is a medium (34-66%) amount of necrotic tissue within the wound bed including Adherent Slough. The periwound skin appearance exhibited: Callus, Maceration. The periwound skin appearance did  not exhibit: Crepitus, Excoriation, Induration, Rash, Scarring, Dry/Scaly, Atrophie Blanche, Cyanosis, Ecchymosis, Hemosiderin Staining, Mottled, Pallor, Rubor, Erythema. The periwound has tenderness on palpation. Assessment Active Problems ICD-10 Non-pressure chronic ulcer of other part of right foot with fat layer exposed Type 2 diabetes mellitus with foot ulcer Other chronic osteomyelitis, right ankle and foot Procedures Wound #4 Pre-procedure diagnosis of Wound #4 is a Diabetic Wound/Ulcer of the Lower Extremity located on the Right Amputation Site - Transmetatarsal .Severity of Tissue Pre Debridement is: Fat layer exposed. There was a Excisional Skin/Subcutaneous Tissue/Muscle/Bone Debridement with a total area of 1.98 sq cm performed by Maxwell Caul., MD. With the following instrument(s): Curette to remove Viable and Non-Viable tissue/material. Material removed includes Bone,Callus, Subcutaneous  Tissue, Slough, Skin: Dermis, and Skin: Epidermis after achieving pain control using Lidocaine 4% T opical Solution. 1 specimen was taken by a Tissue Culture and sent to the lab per facility protocol. A time out was conducted at 13:15, prior to the start of the procedure. A Minimum amount of bleeding was controlled with Pressure. The procedure was tolerated well with a pain level of 0 throughout and a pain level of 0 following the procedure. Post Debridement Measurements: 2.8cm length x 0.9cm width x 1cm depth; 1.979cm^3 volume. RONIQUE, CITO ([id number]) [id number] Page 10 of 11 Character of Wound/Ulcer Post Debridement requires further debridement. Severity of Tissue Post Debridement is: Fat layer exposed. Post procedure Diagnosis Wound #4: Same as Pre-Procedure Plan Follow-up Appointments: Return Appointment in 1 week. - Dr. Leanord Hawking Return Appointment in 2 weeks. - Dr. Leanord Hawking Other: - Pick up oral antibiotics and topical ointment from pharmacy. Obtain x-ray any outpatient facility of [facility]. Anesthetic: (In clinic) Topical Lidocaine 4% applied to wound bed Cellular or Tissue Based Products: Wound #4 Right Amputation Site - Transmetatarsal: Cellular or Tissue Based Product Type: - RUN IVR for APLIGRAF (09/12/22)-SENT NOTES Bathing/ Shower/ Hygiene: May shower with protection but do not get wound dressing(s) wet. Protect dressing(s) with water repellant cover (for example, large plastic bag) or a cast cover and may then take shower. Other Bathing/Shower/Hygiene Orders/Instructions: - When changing the dressing (every other day) you may get right foot wet. On days NOT changing the dressing.Keep wound/dressing dry Edema Control - Lymphedema / SCD / Other: Elevate legs to the level of the heart or above for 30 minutes daily and/or when sitting for 3-4 times a day throughout the day. Avoid standing for long periods of time. Off-Loading: Open toe surgical shoe  to: Radiology ordered were: X-ray, foot right - x-ray right foot related to non healing diabetic foot ulcer looking for infection. Laboratory ordered were: Anaerobic culture - Regular culture sent to hospital of right foot wound. The following medication(s) was prescribed: doxycycline monohydrate oral 100 mg capsule 1 capsule oral twice a day for 7 days for wound infection starting 10/31/2022 WOUND #4: - Amputation Site - Transmetatarsal Wound Laterality: Right Cleanser: Soap and Water Every Other Day/30 Days Discharge Instructions: May shower and wash wound with dial antibacterial soap and water prior to dressing change. On days not changing the dressing, please keep wound/dressing dry Topical: Gentamicin Every Other Day/30 Days Discharge Instructions: As directed by physician Prim Dressing: Maxorb Extra Ag+ Alginate Dressing, 4x4.75 (in/in) Every Other Day/30 Days ary Discharge Instructions: Apply to wound bed as instructed Secondary Dressing: ABD Pad, 8x10 Every Other Day/30 Days Discharge Instructions: Apply over primary dressing as directed. Secured With: [employer], 4.5x3.1 (in/yd) Every Other Day/30 Days Discharge Instructions: Secure with Kerlix  as directed. Secured With: 52M Medipore H Soft Cloth Surgical T ape, 4 x 10 (in/yd) Every Other Day/30 Days Discharge Instructions: Secure with tape as directed. Secured With: ace wrap Every Other Day/30 Days Discharge Instructions: apply lightly to hold dressing in place. 1. I have changed back to silver alginate with gentamicin 2. Bone scraping for CandS 3. Empiric doxycycline 100 twice daily 7 days 4. ESR sedimentation rate CBC. 5. If all of this is unhelpful she is likely going to need an MRI. Last MRI was in late 2023 did not show osteomyelitis in this area Electronic Signature(s) Signed: 10/31/2022 6:11:41 PM By: Shawn Stall RN, BSN Signed: 11/04/2022 6:11:24 PM By: Baltazar Najjar MD Entered By: Shawn Stall on  10/31/2022 18:05:28 -------------------------------------------------------------------------------- SuperBill Details Patient Name: Date of Service: [name] V IS, MA [name]. 10/31/2022 Medical Record Number: [id number] Patient Account Number: [id number] Date of Birth/Sex: Treating RN: 09-07-1980 (42 y.o. [name] Primary Care Provider: Gwinda Passe Other Clinician: Referring Provider: Treating Provider/Extender: Aurelio Brash in Treatment: [address] Sharol Harness ([id number]) [id number] Page 11 of 11 Diagnosis Coding ICD-10 Codes Code Description L97.512 Non-pressure chronic ulcer of other part of right foot with fat layer exposed E11.621 Type 2 diabetes mellitus with foot ulcer M86.671 Other chronic osteomyelitis, right ankle and foot Facility Procedures : CPT4 Code: [id number] Description: 11044 - DEB BONE 20 SQ CM/< ICD-10 Diagnosis Description L97.512 Non-pressure chronic ulcer of other part of right foot with fat layer expo E11.621 Type 2 diabetes mellitus with foot ulcer Modifier: sed Quantity: 1 Physician Procedures : CPT4: Description Modifier Code [id number] Debridement; bone (includes epidermis, dermis, subQ tissue, muscle and/or fascia, if performed) 1st 20 sqcm or less ICD-10 Diagnosis Description L97.512 Non-pressure chronic ulcer of other part of right foot with  fat layer exposed E11.621 Type 2 diabetes mellitus with foot ulcer Quantity: 1 Electronic Signature(s) Signed: 10/31/2022 6:11:41 PM By: Shawn Stall RN, BSN Signed: 11/04/2022 6:11:24 PM By: Baltazar Najjar MD Entered By: Shawn Stall on 10/31/2022 13:20:22

## 2022-11-07 ENCOUNTER — Encounter (HOSPITAL_BASED_OUTPATIENT_CLINIC_OR_DEPARTMENT_OTHER): Payer: Medicaid Other | Admitting: Internal Medicine

## 2022-11-07 ENCOUNTER — Other Ambulatory Visit: Payer: Self-pay

## 2022-11-07 DIAGNOSIS — E11621 Type 2 diabetes mellitus with foot ulcer: Secondary | ICD-10-CM | POA: Diagnosis not present

## 2022-11-07 MED ORDER — SULFAMETHOXAZOLE-TRIMETHOPRIM 800-160 MG PO TABS
1.0000 | ORAL_TABLET | Freq: Two times a day (BID) | ORAL | 0 refills | Status: DC
  Filled 2022-11-07: qty 28, 14d supply, fill #0

## 2022-11-08 NOTE — Progress Notes (Signed)
[name], [name] ([id number]) [phone].pdf Page 1 of 8 Visit Report for 11/07/2022 Arrival Information Details Patient Name: Date of Service: [name] [name], [place] [name] [name]. 11/07/2022 12:30 PM Medical Record Number: [id number] Patient Account Number: [id number] Date of Birth/Sex: Treating RN: 01/11/1981 (42 y.o. [name] Primary Care Patirica Longshore: Gwinda Passe Other Clinician: Referring Deneisha [name]: Treating Kay Shippy/Extender: Aurelio Brash in Treatment: 13 Visit Information History Since Last Visit Added or deleted any medications: No Patient Arrived: Wheel Chair Any new allergies or adverse reactions: No Arrival Time: 12:47 Had a fall or experienced change in No Accompanied By: self activities of daily living that may affect Transfer Assistance: None risk of falls: Patient Identification Verified: Yes Signs or symptoms of abuse/neglect since last visito No Secondary Verification Process Completed: Yes Hospitalized since last visit: No Patient Requires Transmission-Based Precautions: No Implantable device outside of the clinic excluding No Patient Has Alerts: Yes cellular tissue based products placed in the center Patient Alerts: Patient on Blood Thinner since last visit: Eliquis Has Dressing in Place as Prescribed: Yes ABI R 1.02 (08/21/22) Pain Present Now: Yes ABI [name] 0.99 (08/21/22) Electronic Signature(s) Signed: 11/07/2022 5:52:17 PM By: Redmond Pulling RN, BSN Entered By: Redmond Pulling on 11/07/2022 09:50:43 -------------------------------------------------------------------------------- Encounter Discharge Information Details Patient Name: Date of Service: [name] [name] [name] [name]. 11/07/2022 12:30 PM Medical Record Number: [id number] Patient Account Number: [id number] Date of Birth/Sex: Treating RN: [birth year] (42 y.o. [name] Primary Care Ilya Ess: Gwinda Passe Other Clinician: Referring July Linam: Treating  Janele Lague/Extender: Aurelio Brash in Treatment: 13 Encounter Discharge Information Items Post Procedure Vitals Discharge Condition: Stable Temperature (F): 98.2 Ambulatory Status: Wheelchair Pulse (bpm): 80 Discharge Destination: Home Respiratory Rate (breaths/min): 18 Transportation: Private Auto Blood Pressure (mmHg): 113/77 Accompanied By: self Schedule Follow-up Appointment: Yes Clinical Summary of Care: Patient Declined Electronic Signature(s) Signed: 11/07/2022 5:52:17 PM By: Redmond Pulling RN, BSN Entered By: Redmond Pulling on 11/07/2022 10:34:25 Firebaugh, Sharol Harness ([id number]) [id number] Page 2 of 8 -------------------------------------------------------------------------------- Lower Extremity Assessment Details Patient Name: Date of Service: [name] Pereyra, [name] [name] [name]. 11/07/2022 12:30 PM Medical Record Number: [id number] Patient Account Number: [id number] Date of Birth/Sex: Treating RN: 09-27-1980 (42 y.o. [name] Primary Care Kazden Largo: Gwinda Passe Other Clinician: Referring Gillie Fleites: Treating Navika Hoopes/Extender: Aurelio Brash in Treatment: 13 Edema Assessment Assessed: Kyra Searles: No] Franne Forts: No] Edema: [Left: Ye] [Right: s] Calf Left: Right: Point of Measurement: 31 cm From Medial Instep 52.5 cm Ankle Left: Right: Point of Measurement: 9 cm From Medial Instep 27.5 cm Vascular Assessment Pulses: Dorsalis Pedis Palpable: [Right:Yes] Extremity colors, hair growth, and conditions: Extremity Color: [Right:Hyperpigmented] Hair Growth on Extremity: [Right:No] Temperature of Extremity: [Right:Warm] Capillary Refill: [Right:< 3 seconds] Dependent Rubor: [Right:No Yes] Electronic Signature(s) Signed: 11/07/2022 5:52:17 PM By: Redmond Pulling RN, BSN Entered By: Redmond Pulling on 11/07/2022 09:54:41 -------------------------------------------------------------------------------- Multi  Wound Chart Details Patient Name: Date of Service: [name] [name] [name] [name]. 11/07/2022 12:30 PM Medical Record Number: [id number] Patient Account Number: [id number] Date of Birth/Sex: Treating RN: October 10, 1980 (42 y.o. F) Primary Care Jerald Hennington: Gwinda Passe Other Clinician: Referring Lillyanna Glandon: Treating Suzannah Bettes/Extender: Aurelio Brash in Treatment: 13 Vital Signs Height(in): 69 Pulse(bpm): 80 Weight(lbs): 330 Blood Pressure(mmHg): 113/77 Body Mass Index(BMI): 48.7 Temperature(F): 98.2 Respiratory Rate(breaths/min): 18 [4:Photos:] [N/A:N/A] Right Amputation Site - N/A N/A Wound Location: Transmetatarsal Shear/Friction N/A N/A Wounding Event: Diabetic Wound/Ulcer of the Lower N/A N/A Primary Etiology: Extremity Dehisced Wound N/A N/A Secondary Etiology: Deep  Vein Thrombosis, Hypertension, N/A N/A Comorbid History: Type II Diabetes, Osteomyelitis, Neuropathy 07/08/2022 N/A N/A Date Acquired: 13 N/A N/A Weeks of Treatment: Open N/A N/A Wound Status: No N/A N/A Wound Recurrence: 1.2x0.3x1 N/A N/A Measurements [name] x W x D (cm) 0.283 N/A N/A A (cm) : rea 0.283 N/A N/A Volume (cm) : -28.60% N/A N/A % Reduction in A rea: -60.80% N/A N/A % Reduction in Volume: Grade 3 N/A N/A Classification: Medium N/A N/A Exudate A mount: Serosanguineous N/A N/A Exudate Type: red, brown N/A N/A Exudate Color: Distinct, outline attached N/A N/A Wound Margin: Medium (34-66%) N/A N/A Granulation A mount: Red N/A N/A Granulation Quality: Medium (34-66%) N/A N/A Necrotic A mount: Fat Layer (Subcutaneous Tissue): Yes N/A N/A Exposed Structures: Fascia: No Tendon: No Muscle: No Joint: No Bone: No Small (1-33%) N/A N/A Epithelialization: Debridement - Excisional N/A N/A Debridement: Pre-procedure Verification/Time Out 13:05 N/A N/A Taken: Lidocaine 4% Topical Solution N/A N/A Pain Control: Callus, Subcutaneous N/A N/A Tissue  Debrided: Skin/Subcutaneous Tissue N/A N/A Level: 0.28 N/A N/A Debridement A (sq cm): rea Curette N/A N/A Instrument: Minimum N/A N/A Bleeding: Pressure N/A N/A Hemostasis A chieved: Procedure was tolerated well N/A N/A Debridement Treatment Response: 1.2x0.3x1 N/A N/A Post Debridement Measurements [name] x W x D (cm) 0.283 N/A N/A Post Debridement Volume: (cm) Callus: Yes N/A N/A Periwound Skin Texture: Excoriation: No Induration: No Crepitus: No Rash: No Scarring: No Maceration: Yes N/A N/A Periwound Skin Moisture: Dry/Scaly: No Atrophie Blanche: No N/A N/A Periwound Skin Color: Cyanosis: No Ecchymosis: No Erythema: No Hemosiderin Staining: No Mottled: No Pallor: No Rubor: No Yes N/A N/A Tenderness on Palpation: Debridement N/A N/A Procedures Performed: Treatment Notes Electronic Signature(s) Signed: 11/08/2022 12:59:16 PM By: Baltazar Najjar MD Entered By: Baltazar Najjar on 11/07/2022 10:16:36 Bertran, Sharol Harness ([id number]) [id number] Page 4 of 8 -------------------------------------------------------------------------------- Multi-Disciplinary Care Plan Details Patient Name: Date of Service: [name] [name], [place] [name] [name]. 11/07/2022 12:30 PM Medical Record Number: [id number] Patient Account Number: [id number] Date of Birth/Sex: Treating RN: May 06, 1980 (42 y.o. [name] Primary Care Deveion Denz: Gwinda Passe Other Clinician: Referring Mayana Irigoyen: Treating Karma Hiney/Extender: Aurelio Brash in Treatment: 13 Active Inactive Necrotic Tissue Nursing Diagnoses: Knowledge deficit related to management of necrotic/devitalized tissue Goals: Necrotic/devitalized tissue will be minimized in the wound bed Date Initiated: 08/08/2022 Target Resolution Date: 01/13/2023 Goal Status: Active Patient/caregiver will verbalize understanding of reason and process for debridement of necrotic tissue Date Initiated:  08/08/2022 Target Resolution Date: 01/13/2023 Goal Status: Active Interventions: Assess patient pain level pre-, during and post procedure and prior to discharge Provide education on necrotic tissue and debridement process Notes: Nutrition Nursing Diagnoses: Impaired glucose control: actual or potential Goals: Patient/caregiver agrees to and verbalizes understanding of need to obtain nutritional consultation Date Initiated: 08/08/2022 Target Resolution Date: 01/13/2023 Goal Status: Active Patient/caregiver will maintain therapeutic glucose control Date Initiated: 08/08/2022 Target Resolution Date: 01/13/2023 Goal Status: Active Interventions: Assess HgA1c results as ordered upon admission and as needed Provide education on elevated blood sugars and impact on wound healing Provide education on nutrition Treatment Activities: Obtain HgA1c : 08/08/2022 Patient referred to Primary Care Physician for further nutritional evaluation : 08/08/2022 Notes: Pain, Acute or Chronic Nursing Diagnoses: Pain, acute or chronic: actual or potential Potential alteration in comfort, pain Goals: Patient will verbalize adequate pain control and receive pain control interventions during procedures as needed Date Initiated: 08/08/2022 Target Resolution Date: 01/13/2023 Goal Status: Active Patient/caregiver will verbalize comfort level met Date Initiated: 08/08/2022 Target Resolution  Date: 01/13/2023 Goal Status: Active Interventions: [name] [name], [name] [name] ([id number]) [phone].pdf Page 5 of 8 Complete pain assessment as per visit requirements Encourage patient to take pain medications as prescribed Provide education on pain management Treatment Activities: Administer pain control measures as ordered : 08/08/2022 Notes: Wound/Skin Impairment Nursing Diagnoses: Knowledge deficit related to ulceration/compromised skin integrity Goals: Patient/caregiver will verbalize understanding  of skin care regimen Date Initiated: 08/08/2022 Target Resolution Date: 01/13/2023 Goal Status: Active Ulcer/skin breakdown will heal within 14 weeks Date Initiated: 08/08/2022 Target Resolution Date: 01/13/2023 Goal Status: Active Interventions: Assess patient/caregiver ability to perform ulcer/skin care regimen upon admission and as needed Assess ulceration(s) every visit Provide education on ulcer and skin care Treatment Activities: Skin care regimen initiated : 08/08/2022 Topical wound management initiated : 08/08/2022 Notes: Electronic Signature(s) Signed: 11/07/2022 5:52:17 PM By: Redmond Pulling RN, BSN Entered By: Redmond Pulling on 11/07/2022 09:57:43 -------------------------------------------------------------------------------- Pain Assessment Details Patient Name: Date of Service: [name] [name], [name] [name] [name]. 11/07/2022 12:30 PM Medical Record Number: [id number] Patient Account Number: [id number] Date of Birth/Sex: Treating RN: 11/18/1980 (42 y.o. [name] Primary Care Deliana Avalos: Gwinda Passe Other Clinician: Referring Lennex Pietila: Treating Augusta Hilbert/Extender: Aurelio Brash in Treatment: 13 Active Problems Location of Pain Severity and Description of Pain Patient Has Paino Yes Site Locations Rate the pain. Current Pain Level: 2 [name], [name] [name] ([id number]) [id number].pdf Page 6 of 8 Pain Management and Medication Current Pain Management: Electronic Signature(s) Signed: 11/07/2022 5:52:17 PM By: Redmond Pulling RN, BSN Entered By: Redmond Pulling on 11/07/2022 09:51:21 -------------------------------------------------------------------------------- Patient/Caregiver Education Details Patient Name: Date of Service: [name] [name], [name] [name] [name]. 10/24/2024andnbsp12:30 PM Medical Record Number: [id number] Patient Account Number: [id number] Date of Birth/Gender: Treating RN: 1980-09-10 (42 y.o. [name] Primary Care  Physician: Gwinda Passe Other Clinician: Referring Physician: Treating Physician/Extender: Aurelio Brash in Treatment: 13 Education Assessment Education Provided To: Patient Education Topics Provided Wound/Skin Impairment: Methods: Explain/Verbal Responses: State content correctly [employer]) Signed: 11/07/2022 5:52:17 PM By: Redmond Pulling RN, BSN Entered By: Redmond Pulling on 11/07/2022 09:57:58 -------------------------------------------------------------------------------- Wound Assessment Details Patient Name: Date of Service: [name] [name], [name] [name] [name]. 11/07/2022 12:30 PM Medical Record Number: [id number] Patient Account Number: [id number] Date of Birth/Sex: Treating RN: [birth year] (42 y.o. 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 Secondary Dehisced Wound Wounding Event: Shear/Friction Etiology: Date Acquired: 07/08/2022 Wound Status: Open Weeks Of Treatment: 13 Comorbid Deep Vein Thrombosis, Hypertension, Type II Diabetes, Clustered Wound: No History: Osteomyelitis, Neuropathy Photos [name] [name], [name] [name] ([id number]) [id number] Page 7 of 8 Wound Measurements Length: (cm) 1.2 Width: (cm) 0.3 Depth: (cm) 1 Area: (cm) 0.283 Volume: (cm) 0.283 % Reduction in Area: -28.6% % Reduction in Volume: -60.8% Epithelialization: Small (1-33%) Tunneling: No Undermining: No Wound Description Classification: Grade 3 Wound Margin: Distinct, outline attached Exudate Amount: Medium Exudate Type: Serosanguineous Exudate Color: red, brown Foul Odor After Cleansing: No Slough/Fibrino Yes Wound Bed Granulation Amount: Medium (34-66%)  Exposed Structure Granulation Quality: Red Fascia Exposed: No Necrotic Amount: Medium (34-66%) Fat Layer (Subcutaneous Tissue) Exposed: Yes Necrotic Quality: Adherent Slough Tendon Exposed: No Muscle Exposed: No Joint Exposed: No Bone Exposed: No Periwound Skin Texture Texture Color No Abnormalities Noted: No No Abnormalities Noted: No Callus: Yes Atrophie Blanche: No Crepitus: No Cyanosis: No Excoriation: No Ecchymosis:  No Induration: No Erythema: No Rash: No Hemosiderin Staining: No Scarring: No Mottled: No Pallor: No Moisture Rubor: No No Abnormalities Noted: No Dry / Scaly: No Temperature / Pain Maceration: Yes Tenderness on Palpation: Yes Treatment Notes Wound #4 (Amputation Site - Transmetatarsal) Wound Laterality: Right Cleanser Soap and Water Discharge Instruction: May shower and wash wound with dial antibacterial soap and water prior to dressing change. On days not changing the dressing, please keep wound/dressing dry Peri-Wound Care Topical Gentamicin Discharge Instruction: As directed by physician Primary Dressing Maxorb Extra Ag+ Alginate Dressing, 4x4.75 (in/in) Discharge Instruction: Apply to wound bed as instructed Secondary Dressing ABD Pad, 8x10 Discharge Instruction: Apply over primary dressing as directed. Secured With [employer], SCHUPP ([id number]) [id number] Page 8 of 8 Kerlix Roll Sterile, 4.5x3.1 (in/yd) Discharge Instruction: Secure with Kerlix as directed. 59M Medipore H Soft Cloth Surgical T ape, 4 x 10 (in/yd) Discharge Instruction: Secure with tape as directed. ace wrap Discharge Instruction: apply lightly to hold dressing in place. Compression Wrap Compression Stockings Add-Ons Electronic Signature(s) Signed: 11/07/2022 5:52:17 PM By: Redmond Pulling RN, BSN Entered By: Redmond Pulling on 11/07/2022 09:57:26 -------------------------------------------------------------------------------- Vitals  Details Patient Name: Date of Service: [name] [name] [name] [name]. 11/07/2022 12:30 PM Medical Record Number: [id number] Patient Account Number: [id number] Date of Birth/Sex: Treating RN: Mar 07, 1980 (42 y.o. [name] Primary Care Kirk Basquez: Gwinda Passe Other Clinician: Referring Mayte Diers: Treating Siniya Lichty/Extender: Aurelio Brash in Treatment: 13 Vital Signs Time Taken: 12:50 Temperature (F): 98.2 Height (in): 69 Pulse (bpm): 80 Weight (lbs): 330 Respiratory Rate (breaths/min): 18 Body Mass Index (BMI): 48.7 Blood Pressure (mmHg): 113/77 Reference Range: 80 - 120 mg / dl Electronic Signature(s) Signed: 11/07/2022 5:52:17 PM By: Redmond Pulling RN, BSN Entered By: Redmond Pulling on 11/07/2022 09:51:11

## 2022-11-08 NOTE — Progress Notes (Signed)
[name], [name] ([id number]) [id number] Page 1 of 11 Visit Report for 11/07/2022 Debridement Details Patient Name: Date of Service: [name] IS, [place] [name] 11/07/2022 12:30 PM Medical Record Number: [id number] Patient Account Number: [id number] Date of Birth/Sex: Treating RN: [birth year] (42 y.o. F) Primary Care Provider: Gwinda Passe Other Clinician: Referring Provider: Treating Provider/Extender: Aurelio Brash in Treatment: 13 Debridement Performed for Assessment: Wound #4 Right Amputation Site - Transmetatarsal Performed By: Physician Maxwell Caul., MD Debridement Type: Debridement Severity of Tissue Pre Debridement: Fat layer exposed Level of Consciousness (Pre-procedure): Awake and Alert Pre-procedure Verification/Time Out Yes - 13:05 Taken: Start Time: 13:05 Pain Control: Lidocaine 4% Topical Solution Percent of Wound Bed Debrided: 100% T Area Debrided (cm): otal 0.28 Tissue and other material debrided: Non-Viable, Callus, Subcutaneous, Skin: Dermis , Skin: Epidermis Level: Skin/Subcutaneous Tissue Debridement Description: Excisional Instrument: Curette Bleeding: Minimum Hemostasis Achieved: Pressure Response to Treatment: Procedure was tolerated well Level of Consciousness (Post- Awake and Alert procedure): Post Debridement Measurements of Total Wound Length: (cm) 1.2 Width: (cm) 0.3 Depth: (cm) 1 Volume: (cm) 0.283 Character of Wound/Ulcer Post Debridement: Improved Severity of Tissue Post Debridement: Fat layer exposed Post Procedure Diagnosis Same as Pre-procedure Electronic Signature(s) Signed: 11/08/2022 12:59:16 PM By: Baltazar Najjar MD Entered By: Baltazar Najjar on 11/07/2022 13:16:45 -------------------------------------------------------------------------------- HPI Details Patient Name: Date of Service: [name] [name] Kitten, [name] 11/07/2022 12:30 PM Medical Record Number: [id number] Patient Account  Number: [id number] Date of Birth/Sex: Treating RN: [birth year] (42 y.o. F) Primary Care Provider: Gwinda Passe Other Clinician: Referring Provider: Treating Provider/Extender: Aurelio Brash in Treatment: 13 History of Present Illness HPI Description: Admission 01/22/2021 [name], [name] ([id number]) [id number] Page 2 of 11 [name] is a 42 year old female with a past medical history of insulin-dependent uncontrolled type 2 diabetes with last hemoglobin A1c of 13.5, osteomyelitis of the right foot status post transmetatarsal amputation on 12/18/2020 that presents to the clinic for right foot wound. She has had dehiscence of the surgical site. She is currently using wet-to-dry dressings. She has a PICC line and receiving IV ceftriaxone daily for her osteomyelitis. There is an end date of 01/27/2021. She is also taking oral metronidazole. She currently denies systemic signs of infection. 1/19; patient presents for follow-up. She was diagnosed with a DVT to the right lower extremity 2 days ago. She is on Eliquis now. She is scheduled to see her infectious disease doctor tomorrow. She has been using Dakin's wet-to-dry dressings. She denies systemic signs of infection. 1/26; patient presents for follow-up. She saw infectious disease on 1/21 started on Augmentin. Her PICC line and IV ceftriaxone was discontinued. Patient reports stability to her wound. She has been using Dakin's wet-to-dry dressings. She currently denies systemic signs of infection. 2/3; patient presents for follow-up. She continues to use Dakin's wet-to-dry dressings to the wound bed. She saw Dr. Manson Passey with infectious disease yesterday and is continuing Augmentin. T entative end date is 2/16. Patient reports following up with orthopedics. She states there is no further plan from them. She currently denies systemic signs of infection. 2/10; patient presents for follow-up.  She continues to use Dakin's wet to dry dressings. She is scheduled to have her MRI done on 2/14. She states that she had pain to the debridement site from last clinic visit and declines debridement today. She denies systemic signs of infection. She continues to have yellow thick drainage. 2/20; patient presents for follow-up. She continues to use Dakin's wet-to-dry dressings.  She obtained her MRI. The results showed an abscess and she is scheduled to see her orthopedic surgeon on 2/23. She saw infectious disease 2/17 and her antibiotics were extended. She currently denies systemic signs of infection. 3/6; patient presents for follow-up. She had debridement and irrigation of her foot on 03/10/2021 due to abscess noted on MRI. She was started on IV ceftriaxone and oral Flagyl. She has no issues or complaints today. She has been using iodoform packing to the tunnel and Dakin's wet-to-dry to the opening. 03/26/2021: She continues on IV ceftriaxone and oral metronidazole. She has follow-up with infectious disease tomorrow. No significant issues or complaints today. Her mother continues to help her with her wound dressing, using iodoform packing strips into the tunnel and Dakin's to the open portion of the wound. 3/20; patient presents for follow-up. She continues to be on IV ceftriaxone in oral metronidazole. She has been using iodoform to the tunnel and Dakin's wet-to- dry to the open wound. She denies signs of infection. 3/27; patient presents for follow-up. She no longer has a PICC line. She has been using iodoform to the tunnel and Dakin's wet-to-dry to the open wound. She reports improvement in wound healing. She denies signs of infection. 4/3; patient presents for follow-up. She states she has been using Hydrofera Blue to the open wound and iodoform packing to the tunnel without any issues. She denies signs of infection. 4/18; patient presents for follow-up. She saw infectious disease on 4/11. She  has finished her oral antibiotics and completed a total of 6 weeks of antibiotics (this includes IV as well). No further antibiotics needed. She has been using Hydrofera Blue and iodoform packing. She states that the tunneled area has come in and the iodoform is not staying in place anymore. She has no issues or complaints today. She denies signs of infection. 4/24; patient presents for follow-up. She saw Dr. Carlene Coria, plastic surgery to discuss potential skin graft/substitute placement. At this time he thinks that the skin graft would likely not take. He is in agreement with trying a wound VAC. Patient has been using Hydrofera Blue dressing changes with no issues. She denies signs of infection. She reports improvement in wound healing. 5/1; patient presents for follow-up. Unfortunately patient did not have insurance when we ran for the pico. There is an assistance program and we are trying to get this accommodated for the patient. In the meantime she has been using Hydrofera Blue without any issues. She denies signs of infection. 5/8; patient presents for follow-up. We have not heard back if pico is covered by her insurance. She has been using collagen to the wound bed over the past week. She denies signs of infection. 5/18; patient presents for follow-up. She has been using collagen to the wound bed without issues. Again we have not heard if pico is covered by her insurance. She has no issues or complaints today. 5/23; patient presents for follow-up. She has been using collagen to the wound bed. She has no issues or complaints today. She obtained the wound VAC from [facility] and brought this in today. She denies signs of infection. 6/1; patient presents for follow-up. She has been using the wound VAC for the past week. She has had this changed twice since she was last here. She reports more maceration to the periwound. She denies signs of infection. 6/7; right TMA site. There are 2 wounds 0 separated by a  bridge of healed tissue. The more lateral area has undermining. Both areas  have healthy looking granulation at the base but relative the size of the wound is fairly deep. There is no exposed bone no evidence of infection. Her wound VAC was put on hold last week because of surrounding skin maceration she has been using collagen this week. She has a modified shoe 6/12; patient presents for follow-up. Last week the wound VAC was reinitiated. She had no issues with the wound VAC itself. Today she has maceration again noted to the surrounding skin. She denies signs of infection. 6/27; patient presents for follow-up. She has been using Medihoney to the wound bed. We took a break from the wound VAC because the periwound was macerated. She still has some areas of maceration to the distal foot where there is a callus. She currently denies signs of infection. 7/11; patient presents for follow-up. She has been using Medihoney to the wound bed. She has developed some increased warmth and erythema to the lateral aspect of the right foot. She states this is occurred over the past week and there is increased pain. No drainage noted. 7/17; patient presents for follow-up. She has been using Medihoney to the wound bed. She completed her course of Bactrim. She reports improvement in symptoms. 7/24; Patient presents for follow up. She has been using Medihoney to the wound bed without issues. She completed another course of Bactrim. She reports improvement in her symptoms but still has some mild tenderness to the medial aspect of the foot. 7/31; Patient presents for follow-up. She has been using Medihoney and Dakin's to the wound bed. She denies signs of infection. 8/14; patient presents for follow-up. She has been using Dakin's wet-to-dry packing strips to the right medial aspect of the amputation site and Medihoney to the anterior site. She has no issues or complaints today. She has started physical therapy. She denies  signs of infection. 8/29; patient presents for follow-up. She has been using Dakin's wet-to-dry packing strips to the right medial aspect of the amputation site however this is becoming more difficult to place. She did report that she had increased redness and swelling to that site and developed some drainage. It has resolved. She continues with physical therapy. 9/8; patient presents for follow-up. We have been using silver alginate to the tunneled area. She completed her course of antibiotics. She reports no pain, increased swelling or erythema. DELECIA, CUADRA ([id number]) [id number] Page 3 of 11 9/21; patient presents for follow-up. She has been using gentamicin to the tunneled area. She has no issues or complaints today. She denies signs of infection. 10/2; patient presents for follow-up. She is scheduled to have her CT scan on 10/9. She currently denies signs of infection. She denies increased warmth, erythema or purulent drainage from the wound bed. She has been using Dakin's wet-to-dry dressings. 10/12; patient presents for follow-up. She had her CT scan on 10/9 that showed A small irregular rim-enhancing fluid pocket communicating to the overlying soft tissues of the sinus tract compatible with a small abscess. Currently she denies systemic signs of infection. She has been doing Dakin's wet-to-dry packing strips but it is hard for her to pack into the narrow opening. 10/30; patient presents for follow-up. Since last clinic visit she has had OR debridement of her Right foot by Dr. Odis Hollingshead Due to abscess noted on CT. She has been using iodoform packing. 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 She will complete 2 weeks of this and continue with oral amoxicillin for the next  6 to 12 months. She follows with Dr. Luciana Axe for this. She currently denies signs of infection. 11/13; patient presents for follow-up. Patient has been  using silver alginate with gentamicin to the wound bed. She has no issues or complaints today she. She reports improvement in wound healing. 12/4; patient presents for follow-up. She has been using silver alginate and gentamicin to the wound bed. She has no issues or complaints today. 12/8; patient presents for follow-up. She has been using silver alginate with gentamicin to the wound bed. She presents for her first cast placement. She will be back early next week for her obligatory cast change. 12/12; patient presents for follow-up. We have been using collagen with antibiotic ointment to the wound bed under a total contact cast. She has tolerated this well. She is improved in wound healing. 12/18; patient presents for follow-up. We have been using collagen with antibiotic ointment under the total contact cast. She has no complaints today. 1/2; The cast was placed at last clinic visit however patient missed her follow-up and this was taken off last week at a nurse visit. Patient has been using packing strips to the medial narrowed wound. She has noted no drainage. 1/22; patient presents for follow-up. She has been keeping the area covered. She reports no drainage. She has had no issues to the previous wound site. She denies any signs of infection including increased warmth, erythema or purulent drainage. 08/08/2022 Ms. Tekoa Marohl is a 42 year old female with a past medical history of insulin-dependent uncontrolled type 2 diabetes with last hemoglobin A1c of 8.8, chronic osteomyelitis of the right foot status post transmetatarsal amputation on 12/18/2020 that has been previously treated for surgical dehiscence of this site in our clinic. She presents today 6 months after discharge from our clinic due to healed right foot wound with now reopening of the previous surgical site. She reports chronic pain to the site however denies purulent drainage, increased warmth or erythema. She noticed the wound  opening about 1 month ago. She has been doing physical therapy without significant issues. She has inserts to her shoe however she has been advised to follow-up with [employer] for new custom inserts by her orthopedic surgeon now that she has a new wound. She states she will try and do this. She has been using antibiotic ointment to the wound bed. 8/1; patient presents for follow-up. She has been taking her antibiotics without issues. She has been using Dakin's wet-to-dry dressings to the wound bed. The wound is smaller. She denies systemic signs of infection. 8/13; patient presents for follow-up. She had ABIs completed that were normal. On the right it was 1.02 and on the left was 0.99. She has been using silver alginate to the wound bed with gentamicin ointment. She states it is hard to pack the wound bed. She currently denies signs of infection. 8/29; patient presents for follow-up. She has been using collagen with antibiotic ointment to the wound bed. Wound is stable. She denies signs of infection. 9/5; patient presents for follow-up. She has been using silver alginate to the wound bed. Wound is slightly wider. She denies signs of infection. 9/12; patient presents for follow-up. She has been using silver alginate to the wound bed. Wound is slightly smaller today. 9/19; right foot in the setting of a TMA over the first metatarsal head. She has thick callus around this area small fissured wound which actually looks like it is attempting to close. She has a offloading shoe I am not sure  how much activity she is in. I did not have a lot of time to see her today because of transportation 9/26; right foot in the setting of a previous TMA. The wound is over the plantar first metatarsal head. This is a fissured wound with thick callus around the margins. It is small in terms of overall surface area but has a depth of about 0.6 cm there was no palpable bone. She has a history of chronic osteomyelitis although  she has been treated for this I have reviewed CT scans from 2023 also an MRI from 10/26/2021. Neither imaging study showed osteomyelitis in this area although the CT scan suggested osteomyelitis in the fourth metatarsal head. There has been problems with abscesses communicating with the wound although there does not appear to be purulent drainage currently 10/3; right foot at the TMA site. Small probing wound. I changed her to Iodosorb with gauze last time. May be some improvement. She has a depth of about 0.6 cm but there is no palpable bone. She has been previously treated for chronic osteomyelitis in this area. 10/17; this is a patient with a probing wound at the right TMA site. Underlying osteomyelitis previously treated earlier this year. I been using Iodosorb to try and help constrict this wound however today she comes in with a larger circumference a larger tunnel and this probably sits right on top of bone. Also worrisome is there was a streaking dark area just caudal to the wound on the dorsal surface of the foot. The patient is complaining of more pain but no fever. 10/24; culture of this area [bone scraping] revealed the setting of bacterial and 2 coag negative staph. I am going to give her Septra DS 1 p.o. twice daily for 2 weeks. I think it would be simplistic to call this a bone culture but it certainly was a deep culture. An x-ray was done but that has not been reported yet. I had wanted to get lab work including a CBC with differential ESR and C-reactive protein but I am not actually sure that got done last week. We have been using gent and silver alginate. Electronic Signature(s) Signed: 11/08/2022 12:59:16 PM By: Baltazar Najjar MD Entered By: Baltazar Najjar on 11/07/2022 13:18:28 Cordy, Sharol Harness ([id number]) [id number] Page 4 of 11 -------------------------------------------------------------------------------- Physical Exam Details Patient Name:  Date of Service: [name] IS, [name] 11/07/2022 12:30 PM Medical Record Number: [id number] Patient Account Number: [id number] Date of Birth/Sex: Treating RN: 04/13/1980 (42 y.o. F) Primary Care Provider: Gwinda Passe Other Clinician: Referring Provider: Treating Provider/Extender: Aurelio Brash in Treatment: 13 Constitutional Sitting or standing Blood Pressure is within target range for patient.. Pulse regular and within target range for patient.Marland Kitchen Respirations regular, non-labored and within target range.. Temperature is normal and within the target range for the patient.Marland Kitchen Appears in no distress. Notes Wound exam; right foot over the first metatarsal bone at the previous TMA site. This is a deep probing area but no obvious bone this week. Thick callus skin removed from around the wound edge and some nonviable subcutaneous tissue from the wound margins. There is no palpable tenderness no purulence is noted. Electronic Signature(s) Signed: 11/08/2022 12:59:16 PM By: Baltazar Najjar MD Entered By: Baltazar Najjar on 11/07/2022 13:21:24 -------------------------------------------------------------------------------- Physician Orders Details Patient Name: Date of Service: [name] V IS, [name] 11/07/2022 12:30 PM Medical Record Number: [id number] Patient Account Number: [id number] Date of Birth/Sex: Treating RN: 1980-07-26 (42 y.o. [name] Primary  Care Provider: Gwinda Passe Other Clinician: Referring Provider: Treating Provider/Extender: Aurelio Brash in Treatment: 13 Verbal / Phone Orders: No Diagnosis Coding ICD-10 Coding Code Description [phone] Non-pressure chronic ulcer of other part of right foot with fat layer exposed E11.621 Type 2 diabetes mellitus with foot ulcer M86.671 Other chronic osteomyelitis, right ankle and foot Follow-up Appointments ppointment in 1 week. - Dr. Leanord Hawking 11/14/22 @ 12:45 Return  A ppointment in 2 weeks. - Dr. Leanord Hawking Return A Other: - Pick up oral antibiotics from pharmacy. Get lab work done at any [facility] facility Anesthetic (In clinic) Topical Lidocaine 4% applied to wound bed Cellular or Tissue Based Products Wound #4 Right Amputation Site - Transmetatarsal Cellular or Tissue Based Product Type: - RUN IVR for APLIGRAF (09/12/22)-SENT NOTES Bathing/ Shower/ Hygiene May shower with protection but do not get wound dressing(s) wet. Protect dressing(s) with water repellant cover (for example, large plastic bag) or a cast cover and may then take shower. Other Bathing/Shower/Hygiene Orders/Instructions: - When changing the dressing (every other day) you may get right foot wet. On days NOT changing the dressing.Keep wound/dressing dry Edema Control - Lymphedema / SCD / Other Funari, Yaneisy L ([id number]) [id number] Page 5 of 11 Elevate legs to the level of the heart or above for 30 minutes daily and/or when sitting for 3-4 times a day throughout the day. Avoid standing for long periods of time. Off-Loading Open toe surgical shoe to: Wound Treatment Wound #4 - Amputation Site - Transmetatarsal Wound Laterality: Right Cleanser: Soap and Water Every Other Day/30 Days Discharge Instructions: May shower and wash wound with dial antibacterial soap and water prior to dressing change. On days not changing the dressing, please keep wound/dressing dry Topical: Gentamicin Every Other Day/30 Days Discharge Instructions: As directed by physician Prim Dressing: Maxorb Extra Ag+ Alginate Dressing, 4x4.75 (in/in) Every Other Day/30 Days ary Discharge Instructions: Apply to wound bed as instructed Secondary Dressing: ABD Pad, 8x10 Every Other Day/30 Days Discharge Instructions: Apply over primary dressing as directed. Secured With: [employer], 4.5x3.1 (in/yd) Every Other Day/30 Days Discharge Instructions: Secure with Kerlix as directed. Secured  With: 56M Medipore H Soft Cloth Surgical T ape, 4 x 10 (in/yd) Every Other Day/30 Days Discharge Instructions: Secure with tape as directed. Secured With: ace wrap Every Other Day/30 Days Discharge Instructions: apply lightly to hold dressing in place. Laboratory C reactive protein [Mass/volume] in Serum or Plasma (CHEM) - Labwork to evaluate infection - (ICD10 M86.671 - Other chronic osteomyelitis, right ankle and foot) LOINC Code: 1988-5 Convenience Name: C Reactive Protein in serum or plasma Erythrocyte sedimentation rate (HEM) - Labwork to evaluate infection - (ICD10 M86.671 - Other chronic osteomyelitis, right ankle and foot) LOINC Code: 21308-6 Convenience Name: Sed rate-method unspecified CBC W A Differential panel in Blood (HEM-CBC) - Labwork to evaluate infection - (ICD10 M86.671 - Other chronic osteomyelitis, right ankle and uto foot) LOINC Code: 57846-9 Convenience Name: CBC W Auto Differential panel Patient Medications llergies: No Known Drug Allergies A Notifications Medication Indication Start End 11/07/2022 lidocaine DOSE topical 4 % cream - cream topical once daily Electronic Signature(s) Signed: 11/07/2022 5:52:17 PM By: Redmond Pulling RN, BSN Signed: 11/08/2022 12:59:16 PM By: Baltazar Najjar MD Previous Signature: 11/07/2022 1:12:55 PM Version By: Baltazar Najjar MD Entered By: Redmond Pulling on 11/07/2022 13:15:37 Prescription 11/07/2022 -------------------------------------------------------------------------------- Hargadon, Coley L. Baltazar Najjar MD Patient Name: Provider: JOSEPHYNE, CARLONI ([id number]) [id number] Page 6 of 11 Jun 11, 1980 [id number] Date of Birth: NPI#: [id number] Sex: DEA #:  [phone] Phone #: License #: [id number] UPN: Patient Address: [address] Eligha Bridegroom [facility] [place] [address] , [address] [place], [place]  [address] [phone] Allergies No Known Drug Allergies Provider's Orders C reactive protein [Mass/volume] in Serum or Plasma - ICD10: M86.671 - Labwork to evaluate infection LOINC Code: 1988-5 Convenience Name: C Reactive Protein in serum or plasma Hand Signature: Date(s): Prescription 11/07/2022 Venable, Lucina L. Baltazar Najjar MD Patient Name: Provider: 1980/01/29 [id number] Date of Birth: NPI#: [id number] Sex: DEA #: [phone] Phone #: License #: [id number] UPN: Patient Address: [address] Eligha Bridegroom [facility] [place] [address] , [address] [place], [place] [address] [phone] Allergies No Known Drug Allergies Provider's Orders Erythrocyte sedimentation rate - ICD10: M86.671 - Labwork to evaluate infection LOINC Code: [phone] Convenience Name: Sed rate-method unspecified Hand Signature: Date(s): Prescription 11/07/2022 Grill, Shellene L. Baltazar Najjar MD Patient Name: Provider: 1980-12-09 [id number] Date of Birth: NPI#: [id number] Sex: DEA #: [phone] Phone #: License #: [id number] UPN: Patient Address: [address] Eligha Bridegroom [facility] [place] [address] , [address] [place], [place] [address] [phone] Allergies No Known Drug Allergies Provider's Orders CBC W A Differential panel in Blood - ICD10: M86.671 - Labwork to evaluate infection uto LOINC Code: [phone] Convenience Name: CBC W Auto Differential panel Hand Signature: Date(s): [name], MUGAVERO ([id number]) [id number] Page 7 of 11 Electronic Signature(s) Signed: 11/07/2022 5:52:17 PM By: Redmond Pulling RN, BSN Signed: 11/08/2022 12:59:16 PM By: Baltazar Najjar MD Entered By: Redmond Pulling on 11/07/2022 13:15:37 -------------------------------------------------------------------------------- Problem List Details Patient Name: Date of Service: [name]  [name] IS, [name] 11/07/2022 12:30 PM Medical Record Number: [id number] Patient Account Number: [id number] Date of Birth/Sex: Treating RN: 02-24-1980 (42 y.o. [name] Primary Care Provider: Gwinda Passe Other Clinician: Referring Provider: Treating Provider/Extender: Aurelio Brash in Treatment: 13 Active Problems ICD-10 Encounter Code Description Active Date MDM Diagnosis [phone] Non-pressure chronic ulcer of other part of right foot with fat layer exposed 08/08/2022 No Yes E11.621 Type 2 diabetes mellitus with foot ulcer 08/08/2022 No Yes M86.671 Other chronic osteomyelitis, right ankle and foot 08/08/2022 No Yes Inactive Problems Resolved Problems Electronic Signature(s) Signed: 11/08/2022 12:59:16 PM By: Baltazar Najjar MD Entered By: Baltazar Najjar on 11/07/2022 13:14:51 -------------------------------------------------------------------------------- Progress Note Details Patient Name: Date of Service: [name] [name] Kitten, [name] 11/07/2022 12:30 PM Medical Record Number: [id number] Patient Account Number: [id number] Date of Birth/Sex: Treating RN: 06/15/1980 (42 y.o. F) Primary Care Provider: Gwinda Passe Other Clinician: Referring Provider: Treating Provider/Extender: Aurelio Brash in Treatment: 13 Subjective History of Present Illness (HPI) Admission 01/22/2021 [name] is a 42 year old female with a past medical history of insulin-dependent uncontrolled type 2 diabetes with last hemoglobin A1c of 13.5, osteomyelitis of the right foot status post transmetatarsal amputation on 12/18/2020 that presents to the clinic for right foot wound. She has had dehiscence of the surgical site. She is currently using wet-to-dry dressings. She has a PICC line and receiving IV ceftriaxone daily for her osteomyelitis. There is an end date [name], WISECARVER ([id number]) [phone].pdf Page 8 of 11 of  01/27/2021. She is also taking oral metronidazole. She currently denies systemic signs of infection. 1/19; patient presents for follow-up. She was diagnosed with a DVT to the  right lower extremity 2 days ago. She is on Eliquis now. She is scheduled to see her infectious disease doctor tomorrow. She has been using Dakin's wet-to-dry dressings. She denies systemic signs of infection. 1/26; patient presents for follow-up. She saw infectious disease on 1/21 started on Augmentin. Her PICC line and IV ceftriaxone was discontinued. Patient reports stability to her wound. She has been using Dakin's wet-to-dry dressings. She currently denies systemic signs of infection. 2/3; patient presents for follow-up. She continues to use Dakin's wet-to-dry dressings to the wound bed. She saw Dr. Manson Passey with infectious disease yesterday and is continuing Augmentin. T entative end date is 2/16. Patient reports following up with orthopedics. She states there is no further plan from them. She currently denies systemic signs of infection. 2/10; patient presents for follow-up. She continues to use Dakin's wet to dry dressings. She is scheduled to have her MRI done on 2/14. She states that she had pain to the debridement site from last clinic visit and declines debridement today. She denies systemic signs of infection. She continues to have yellow thick drainage. 2/20; patient presents for follow-up. She continues to use Dakin's wet-to-dry dressings. She obtained her MRI. The results showed an abscess and she is scheduled to see her orthopedic surgeon on 2/23. She saw infectious disease 2/17 and her antibiotics were extended. She currently denies systemic signs of infection. 3/6; patient presents for follow-up. She had debridement and irrigation of her foot on 03/10/2021 due to abscess noted on MRI. She was started on IV ceftriaxone and oral Flagyl. She has no issues or complaints today. She has been using iodoform packing to  the tunnel and Dakin's wet-to-dry to the opening. 03/26/2021: She continues on IV ceftriaxone and oral metronidazole. She has follow-up with infectious disease tomorrow. No significant issues or complaints today. Her mother continues to help her with her wound dressing, using iodoform packing strips into the tunnel and Dakin's to the open portion of the wound. 3/20; patient presents for follow-up. She continues to be on IV ceftriaxone in oral metronidazole. She has been using iodoform to the tunnel and Dakin's wet-to- dry to the open wound. She denies signs of infection. 3/27; patient presents for follow-up. She no longer has a PICC line. She has been using iodoform to the tunnel and Dakin's wet-to-dry to the open wound. She reports improvement in wound healing. She denies signs of infection. 4/3; patient presents for follow-up. She states she has been using Hydrofera Blue to the open wound and iodoform packing to the tunnel without any issues. She denies signs of infection. 4/18; patient presents for follow-up. She saw infectious disease on 4/11. She has finished her oral antibiotics and completed a total of 6 weeks of antibiotics (this includes IV as well). No further antibiotics needed. She has been using Hydrofera Blue and iodoform packing. She states that the tunneled area has come in and the iodoform is not staying in place anymore. She has no issues or complaints today. She denies signs of infection. 4/24; patient presents for follow-up. She saw Dr. Carlene Coria, plastic surgery to discuss potential skin graft/substitute placement. At this time he thinks that the skin graft would likely not take. He is in agreement with trying a wound VAC. Patient has been using Hydrofera Blue dressing changes with no issues. She denies signs of infection. She reports improvement in wound healing. 5/1; patient presents for follow-up. Unfortunately patient did not have insurance when we ran for the pico. There is an  assistance program  and we are trying to get this accommodated for the patient. In the meantime she has been using Hydrofera Blue without any issues. She denies signs of infection. 5/8; patient presents for follow-up. We have not heard back if pico is covered by her insurance. She has been using collagen to the wound bed over the past week. She denies signs of infection. 5/18; patient presents for follow-up. She has been using collagen to the wound bed without issues. Again we have not heard if pico is covered by her insurance. She has no issues or complaints today. 5/23; patient presents for follow-up. She has been using collagen to the wound bed. She has no issues or complaints today. She obtained the wound VAC from [facility] and brought this in today. She denies signs of infection. 6/1; patient presents for follow-up. She has been using the wound VAC for the past week. She has had this changed twice since she was last here. She reports more maceration to the periwound. She denies signs of infection. 6/7; right TMA site. There are 2 wounds 0 separated by a bridge of healed tissue. The more lateral area has undermining. Both areas have healthy looking granulation at the base but relative the size of the wound is fairly deep. There is no exposed bone no evidence of infection. Her wound VAC was put on hold last week because of surrounding skin maceration she has been using collagen this week. She has a modified shoe 6/12; patient presents for follow-up. Last week the wound VAC was reinitiated. She had no issues with the wound VAC itself. Today she has maceration again noted to the surrounding skin. She denies signs of infection. 6/27; patient presents for follow-up. She has been using Medihoney to the wound bed. We took a break from the wound VAC because the periwound was macerated. She still has some areas of maceration to the distal foot where there is a callus. She currently denies signs of  infection. 7/11; patient presents for follow-up. She has been using Medihoney to the wound bed. She has developed some increased warmth and erythema to the lateral aspect of the right foot. She states this is occurred over the past week and there is increased pain. No drainage noted. 7/17; patient presents for follow-up. She has been using Medihoney to the wound bed. She completed her course of Bactrim. She reports improvement in symptoms. 7/24; Patient presents for follow up. She has been using Medihoney to the wound bed without issues. She completed another course of Bactrim. She reports improvement in her symptoms but still has some mild tenderness to the medial aspect of the foot. 7/31; Patient presents for follow-up. She has been using Medihoney and Dakin's to the wound bed. She denies signs of infection. 8/14; patient presents for follow-up. She has been using Dakin's wet-to-dry packing strips to the right medial aspect of the amputation site and Medihoney to the anterior site. She has no issues or complaints today. She has started physical therapy. She denies signs of infection. 8/29; patient presents for follow-up. She has been using Dakin's wet-to-dry packing strips to the right medial aspect of the amputation site however this is becoming more difficult to place. She did report that she had increased redness and swelling to that site and developed some drainage. It has resolved. She continues with physical therapy. 9/8; patient presents for follow-up. We have been using silver alginate to the tunneled area. She completed her course of antibiotics. She reports no pain, increased swelling or  erythema. 9/21; patient presents for follow-up. She has been using gentamicin to the tunneled area. She has no issues or complaints today. She denies signs of infection. [name], GAN ([id number]) [id number] Page 9 of 11 10/2; patient presents for follow-up. She is scheduled  to have her CT scan on 10/9. She currently denies signs of infection. She denies increased warmth, erythema or purulent drainage from the wound bed. She has been using Dakin's wet-to-dry dressings. 10/12; patient presents for follow-up. She had her CT scan on 10/9 that showed A small irregular rim-enhancing fluid pocket communicating to the overlying soft tissues of the sinus tract compatible with a small abscess. Currently she denies systemic signs of infection. She has been doing Dakin's wet-to-dry packing strips but it is hard for her to pack into the narrow opening. 10/30; patient presents for follow-up. Since last clinic visit she has had OR debridement of her Right foot by Dr. Odis Hollingshead Due to abscess noted on CT. She has been using iodoform packing. 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 She will complete 2 weeks of this and continue with oral amoxicillin for the next 6 to 12 months. She follows with Dr. Luciana Axe for this. She currently denies signs of infection. 11/13; patient presents for follow-up. Patient has been using silver alginate with gentamicin to the wound bed. She has no issues or complaints today she. She reports improvement in wound healing. 12/4; patient presents for follow-up. She has been using silver alginate and gentamicin to the wound bed. She has no issues or complaints today. 12/8; patient presents for follow-up. She has been using silver alginate with gentamicin to the wound bed. She presents for her first cast placement. She will be back early next week for her obligatory cast change. 12/12; patient presents for follow-up. We have been using collagen with antibiotic ointment to the wound bed under a total contact cast. She has tolerated this well. She is improved in wound healing. 12/18; patient presents for follow-up. We have been using collagen with antibiotic ointment under the total contact cast. She has no complaints  today. 1/2; The cast was placed at last clinic visit however patient missed her follow-up and this was taken off last week at a nurse visit. Patient has been using packing strips to the medial narrowed wound. She has noted no drainage. 1/22; patient presents for follow-up. She has been keeping the area covered. She reports no drainage. She has had no issues to the previous wound site. She denies any signs of infection including increased warmth, erythema or purulent drainage. 08/08/2022 [name] is a 42 year old female with a past medical history of insulin-dependent uncontrolled type 2 diabetes with last hemoglobin A1c of 8.8, chronic osteomyelitis of the right foot status post transmetatarsal amputation on 12/18/2020 that has been previously treated for surgical dehiscence of this site in our clinic. She presents today 6 months after discharge from our clinic due to healed right foot wound with now reopening of the previous surgical site. She reports chronic pain to the site however denies purulent drainage, increased warmth or erythema. She noticed the wound opening about 1 month ago. She has been doing physical therapy without significant issues. She has inserts to her shoe however she has been advised to follow-up with [employer] for new custom inserts by her orthopedic surgeon now that she has a new wound. She states she will try and do this. She has been using antibiotic ointment to the wound  bed. 8/1; patient presents for follow-up. She has been taking her antibiotics without issues. She has been using Dakin's wet-to-dry dressings to the wound bed. The wound is smaller. She denies systemic signs of infection. 8/13; patient presents for follow-up. She had ABIs completed that were normal. On the right it was 1.02 and on the left was 0.99. She has been using silver alginate to the wound bed with gentamicin ointment. She states it is hard to pack the wound bed. She currently denies signs of  infection. 8/29; patient presents for follow-up. She has been using collagen with antibiotic ointment to the wound bed. Wound is stable. She denies signs of infection. 9/5; patient presents for follow-up. She has been using silver alginate to the wound bed. Wound is slightly wider. She denies signs of infection. 9/12; patient presents for follow-up. She has been using silver alginate to the wound bed. Wound is slightly smaller today. 9/19; right foot in the setting of a TMA over the first metatarsal head. She has thick callus around this area small fissured wound which actually looks like it is attempting to close. She has a offloading shoe I am not sure how much activity she is in. I did not have a lot of time to see her today because of transportation 9/26; right foot in the setting of a previous TMA. The wound is over the plantar first metatarsal head. This is a fissured wound with thick callus around the margins. It is small in terms of overall surface area but has a depth of about 0.6 cm there was no palpable bone. She has a history of chronic osteomyelitis although she has been treated for this I have reviewed CT scans from 2023 also an MRI from 10/26/2021. Neither imaging study showed osteomyelitis in this area although the CT scan suggested osteomyelitis in the fourth metatarsal head. There has been problems with abscesses communicating with the wound although there does not appear to be purulent drainage currently 10/3; right foot at the TMA site. Small probing wound. I changed her to Iodosorb with gauze last time. May be some improvement. She has a depth of about 0.6 cm but there is no palpable bone. She has been previously treated for chronic osteomyelitis in this area. 10/17; this is a patient with a probing wound at the right TMA site. Underlying osteomyelitis previously treated earlier this year. I been using Iodosorb to try and help constrict this wound however today she comes in with  a larger circumference a larger tunnel and this probably sits right on top of bone. Also worrisome is there was a streaking dark area just caudal to the wound on the dorsal surface of the foot. The patient is complaining of more pain but no fever. 10/24; culture of this area [bone scraping] revealed the setting of bacterial and 2 coag negative staph. I am going to give her Septra DS 1 p.o. twice daily for 2 weeks. I think it would be simplistic to call this a bone culture but it certainly was a deep culture. An x-ray was done but that has not been reported yet. I had wanted to get lab work including a CBC with differential ESR and C-reactive protein but I am not actually sure that got done last week. We have been using gent and silver alginate. Objective Constitutional Sitting or standing Blood Pressure is within target range for patient.. Pulse regular and within target range for patient.Marland Kitchen Respirations regular, non-labored and within target range.. Temperature is normal and  within the target range for the patient.Marland Kitchen Appears in no distress. Vitals Time Taken: 12:50 PM, Height: 69 in, Weight: 330 lbs, BMI: 48.7, Temperature: 98.2 F, Pulse: 80 bpm, Respiratory Rate: 18 breaths/min, Blood Mabie, Leland L ([id number]) [id number] Page 10 of 11 Pressure: 113/77 mmHg. General Notes: Wound exam; right foot over the first metatarsal bone at the previous TMA site. This is a deep probing area but no obvious bone this week. Thick callus skin removed from around the wound edge and some nonviable subcutaneous tissue from the wound margins. There is no palpable tenderness no purulence is noted. Integumentary (Hair, Skin) Wound #4 status is Open. Original cause of wound was Shear/Friction. The date acquired was: 07/08/2022. The wound has been in treatment 13 weeks. The wound is located on the Right Amputation Site - Transmetatarsal. The wound measures 1.2cm length x 0.3cm width x 1cm  depth; 0.283cm^2 area and 0.283cm^3 volume. There is Fat Layer (Subcutaneous Tissue) exposed. There is no tunneling or undermining noted. There is a medium amount of serosanguineous drainage noted. The wound margin is distinct with the outline attached to the wound base. There is medium (34-66%) red granulation within the wound bed. There is a medium (34-66%) amount of necrotic tissue within the wound bed including Adherent Slough. The periwound skin appearance exhibited: Callus, Maceration. The periwound skin appearance did not exhibit: Crepitus, Excoriation, Induration, Rash, Scarring, Dry/Scaly, Atrophie Blanche, Cyanosis, Ecchymosis, Hemosiderin Staining, Mottled, Pallor, Rubor, Erythema. The periwound has tenderness on palpation. Assessment Active Problems ICD-10 Non-pressure chronic ulcer of other part of right foot with fat layer exposed Type 2 diabetes mellitus with foot ulcer Other chronic osteomyelitis, right ankle and foot Procedures Wound #4 Pre-procedure diagnosis of Wound #4 is a Diabetic Wound/Ulcer of the Lower Extremity located on the Right Amputation Site - Transmetatarsal .Severity of Tissue Pre Debridement is: Fat layer exposed. There was a Excisional Skin/Subcutaneous Tissue Debridement with a total area of 0.28 sq cm performed by Maxwell Caul., MD. With the following instrument(s): Curette to remove Non-Viable tissue/material. Material removed includes Callus, Subcutaneous Tissue, Skin: Dermis, and Skin: Epidermis after achieving pain control using Lidocaine 4% Topical Solution. No specimens were taken. A time out was conducted at 13:05, prior to the start of the procedure. A Minimum amount of bleeding was controlled with Pressure. The procedure was tolerated well. Post Debridement Measurements: 1.2cm length x 0.3cm width x 1cm depth; 0.283cm^3 volume. Character of Wound/Ulcer Post Debridement is improved. Severity of Tissue Post Debridement is: Fat layer exposed. Post  procedure Diagnosis Wound #4: Same as Pre-Procedure Plan Follow-up Appointments: Return Appointment in 1 week. - Dr. Leanord Hawking 11/14/22 @ 12:45 Return Appointment in 2 weeks. - Dr. Leanord Hawking Other: - Pick up oral antibiotics from pharmacy. Get lab work done at any [facility] facility Anesthetic: (In clinic) Topical Lidocaine 4% applied to wound bed Cellular or Tissue Based Products: Wound #4 Right Amputation Site - Transmetatarsal: Cellular or Tissue Based Product Type: - RUN IVR for APLIGRAF (09/12/22)-SENT NOTES Bathing/ Shower/ Hygiene: May shower with protection but do not get wound dressing(s) wet. Protect dressing(s) with water repellant cover (for example, large plastic bag) or a cast cover and may then take shower. Other Bathing/Shower/Hygiene Orders/Instructions: - When changing the dressing (every other day) you may get right foot wet. On days NOT changing the dressing.Keep wound/dressing dry Edema Control - Lymphedema / SCD / Other: Elevate legs to the level of the heart or above for 30 minutes daily and/or when sitting for 3-4 times a  day throughout the day. Avoid standing for long periods of time. Off-Loading: Open toe surgical shoe to: Laboratory ordered were: C Reactive Protein in serum or plasma - Labwork to evaluate infection, Sed rate -method unspecified - Labwork to evaluate infection, CBC W Auto Differential panel - Labwork to evaluate infection The following medication(s) was prescribed: lidocaine topical 4 % cream cream topical once daily was prescribed at facility WOUND #4: - Amputation Site - Transmetatarsal Wound Laterality: Right Cleanser: Soap and Water Every Other Day/30 Days Discharge Instructions: May shower and wash wound with dial antibacterial soap and water prior to dressing change. On days not changing the dressing, please keep wound/dressing dry Topical: Gentamicin Every Other Day/30 Days Discharge Instructions: As directed by physician Prim Dressing: Maxorb  Extra Ag+ Alginate Dressing, 4x4.75 (in/in) Every Other Day/30 Days ary Discharge Instructions: Apply to wound bed as instructed Secondary Dressing: ABD Pad, 8x10 Every Other Day/30 Days Discharge Instructions: Apply over primary dressing as directed. Secured With: [employer], 4.5x3.1 (in/yd) Every Other Day/30 Days MYLINH, BILTON L ([id number]) [phone].pdf Page 11 of 11 Discharge Instructions: Secure with Kerlix as directed. Secured With: 32M Medipore H Soft Cloth Surgical T ape, 4 x 10 (in/yd) Every Other Day/30 Days Discharge Instructions: Secure with tape as directed. Secured With: ace wrap Every Other Day/30 Days Discharge Instructions: apply lightly to hold dressing in place. 1. Still using gent and silver alginate 2. Trimethoprim/sulfamethoxazole for 2 weeks 3. Awaiting final report from the x-ray 4. CBC with differential sedimentation rate C-reactive protein 5. May ultimately need an MRI depending on the results above. Electronic Signature(s) Signed: 11/08/2022 12:59:16 PM By: Baltazar Najjar MD Entered By: Baltazar Najjar on 11/07/2022 13:22:25 -------------------------------------------------------------------------------- SuperBill Details Patient Name: Date of Service: [name] [name] IS, [name] 11/07/2022 Medical Record Number: [id number] Patient Account Number: [id number] Date of Birth/Sex: Treating RN: 20-Jun-1980 (42 y.o. 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

## 2022-11-12 ENCOUNTER — Other Ambulatory Visit: Payer: Self-pay

## 2022-11-14 ENCOUNTER — Ambulatory Visit (HOSPITAL_BASED_OUTPATIENT_CLINIC_OR_DEPARTMENT_OTHER): Payer: Medicaid Other | Admitting: Internal Medicine

## 2022-11-14 NOTE — Progress Notes (Signed)
MAREYA, RANK ([id number]) [id number] Page 1 of 8 Visit Report for 10/31/2022 Arrival Information Details Patient Name: Date of Service: [name], [place] [name] 10/31/2022 12:30 PM Medical Record Number: [id number] Patient Account Number: [id number] Date of Birth/Sex: Treating RN: 1980/10/29 (42 y.o. F) Primary Care Everitt Wenner: Gwinda Passe Other Clinician: Referring Maryrose Colvin: Treating Yoali Conry/Extender: Aurelio Brash in Treatment: 12 Visit Information History Since Last Visit Added or deleted any medications: No Patient Arrived: Dan Humphreys Any new allergies or adverse reactions: No Arrival Time: 12:48 Had a fall or experienced change in No Accompanied By: self activities of daily living that may affect Transfer Assistance: None risk of falls: Patient Identification Verified: Yes Signs or symptoms of abuse/neglect since last visito No Secondary Verification Process Completed: Yes Hospitalized since last visit: No Patient Requires Transmission-Based Precautions: No Implantable device outside of the clinic excluding No Patient Has Alerts: Yes cellular tissue based products placed in the center Patient Alerts: Patient on Blood Thinner since last visit: Eliquis Has Dressing in Place as Prescribed: Yes ABI R 1.02 (08/21/22) Pain Present Now: No ABI L 0.99 (08/21/22) Electronic Signature(s) Signed: 11/13/2022 4:41:33 PM By: Thayer Dallas Entered By: Thayer Dallas on 10/31/2022 09:49:11 -------------------------------------------------------------------------------- Encounter Discharge Information Details Patient Name: Date of Service: [name] V IS, [name] 10/31/2022 12:30 PM Medical Record Number: [id number] Patient Account Number: [id number] Date of Birth/Sex: Treating RN: 1981-01-07 (41 y.o. [name] Primary Care Malkie Wille: Gwinda Passe Other Clinician: Referring Denman Pichardo: Treating Shakayla Hickox/Extender: Aurelio Brash in Treatment: 12 Encounter Discharge Information Items Post Procedure Vitals Discharge Condition: Stable Temperature (F): 97.9 Ambulatory Status: Wheelchair Pulse (bpm): 85 Discharge Destination: Home Respiratory Rate (breaths/min): 18 Transportation: Private Auto Blood Pressure (mmHg): 126/85 Accompanied By: self Schedule Follow-up Appointment: Yes Clinical Summary of Care: Electronic Signature(s) Signed: 10/31/2022 6:11:41 PM By: Shawn Stall RN, BSN Entered By: Shawn Stall on 10/31/2022 10:22:12 [place], Sharol Harness ([id number]) [id number] Page 2 of 8 -------------------------------------------------------------------------------- Lower Extremity Assessment Details Patient Name: Date of Service: [name] Pereyra, [name] 10/31/2022 12:30 PM Medical Record Number: [id number] Patient Account Number: [id number] Date of Birth/Sex: Treating RN: 1980-03-11 (42 y.o. F) Primary Care Bayron Dalto: Gwinda Passe Other Clinician: Referring Kalinda Romaniello: Treating Gaddiel Cullens/Extender: Aurelio Brash in Treatment: 12 Edema Assessment Assessed: Kyra Searles: No] Franne Forts: No] Edema: [Left: Ye] [Right: s] Calf Left: Right: Point of Measurement: 31 cm From Medial Instep 50 cm Ankle Left: Right: Point of Measurement: 9 cm From Medial Instep 27 cm Vascular Assessment Extremity colors, hair growth, and conditions: Extremity Color: [Right:Hyperpigmented] Hair Growth on Extremity: [Right:No] Temperature of Extremity: [Right:Warm] Capillary Refill: [Right:< 3 seconds] Dependent Rubor: [Right:No Yes] Electronic Signature(s) Signed: 11/13/2022 4:41:33 PM By: Thayer Dallas Entered By: Thayer Dallas on 10/31/2022 09:49:57 -------------------------------------------------------------------------------- Multi Wound Chart Details Patient Name: Date of Service: [name] V IS, [name] 10/31/2022 12:30 PM Medical Record Number:  [id number] Patient Account Number: [id number] Date of Birth/Sex: Treating RN: [birth year] (42 y.o. F) Primary Care Lindee Leason: Gwinda Passe Other Clinician: Referring Francesa Eugenio: Treating Deshannon Hinchliffe/Extender: Aurelio Brash in Treatment: 12 Vital Signs Height(in): 69 Pulse(bpm): 85 Weight(lbs): 330 Blood Pressure(mmHg): 126/85 Body Mass Index(BMI): 48.7 Temperature(F): 97.9 Respiratory Rate(breaths/min): 18 [4:Photos:] [N/A:N/A] Right Amputation Site - N/A N/A Wound Location: Transmetatarsal Shear/Friction N/A N/A Wounding Event: Diabetic Wound/Ulcer of the Lower N/A N/A Primary Etiology: Extremity Dehisced Wound N/A N/A Secondary Etiology: Deep Vein Thrombosis, Hypertension, N/A N/A Comorbid History: Type II Diabetes, Osteomyelitis, Neuropathy 07/08/2022 N/A N/A Date  Acquired: 12 N/A N/A Weeks of Treatment: Open N/A N/A Wound Status: No N/A N/A Wound Recurrence: 2.8x0.9x1 N/A N/A Measurements L x W x D (cm) 1.979 N/A N/A A (cm) : rea 1.979 N/A N/A Volume (cm) : -799.50% N/A N/A % Reduction in A rea: -1024.40% N/A N/A % Reduction in Volume: Grade 3 N/A N/A Classification: Medium N/A N/A Exudate A mount: Serosanguineous N/A N/A Exudate Type: red, brown N/A N/A Exudate Color: Distinct, outline attached N/A N/A Wound Margin: Medium (34-66%) N/A N/A Granulation A mount: Red N/A N/A Granulation Quality: Medium (34-66%) N/A N/A Necrotic A mount: Fat Layer (Subcutaneous Tissue): Yes N/A N/A Exposed Structures: Fascia: No Tendon: No Muscle: No Joint: No Bone: No Small (1-33%) N/A N/A Epithelialization: Debridement - Excisional N/A N/A Debridement: Pre-procedure Verification/Time Out 13:15 N/A N/A Taken: Lidocaine 4% Topical Solution N/A N/A Pain Control: Bone, Callus, Subcutaneous, Slough N/A N/A Tissue Debrided: Skin/Subcutaneous N/A N/A Level: Tissue/Muscle/Bone 1.98 N/A N/A Debridement A (sq cm): rea Curette N/A  N/A Instrument: Minimum N/A N/A Bleeding: Pressure N/A N/A Hemostasis Achieved: 0 N/A N/A Procedural Pain: 0 N/A N/A Post Procedural Pain: Debridement Treatment Response: Procedure was tolerated well N/A N/A Post Debridement Measurements L x 2.8x0.9x1 N/A N/A W x D (cm) 1.979 N/A N/A Post Debridement Volume: (cm) Callus: Yes N/A N/A Periwound Skin Texture: Excoriation: No Induration: No Crepitus: No Rash: No Scarring: No Maceration: Yes N/A N/A Periwound Skin Moisture: Dry/Scaly: No Atrophie Blanche: No N/A N/A Periwound Skin Color: Cyanosis: No Ecchymosis: No Erythema: No Hemosiderin Staining: No Mottled: No Pallor: No Rubor: No Yes N/A N/A Tenderness on Palpation: Debridement N/A N/A Procedures Performed: Treatment Notes Electronic Signature(s) Signed: 11/04/2022 6:11:24 PM By: Baltazar Najjar MD Entered By: Baltazar Najjar on 10/31/2022 10:21:23 Schafer, Sharol Harness ([id number]) [id number] Page 4 of 8 -------------------------------------------------------------------------------- Multi-Disciplinary Care Plan Details Patient Name: Date of Service: [name], [place] [name] 10/31/2022 12:30 PM Medical Record Number: [id number] Patient Account Number: [id number] Date of Birth/Sex: Treating RN: 1980/12/22 (42 y.o. [name], [name] Primary Care Tarita Deshmukh: Gwinda Passe Other Clinician: Referring Vaden Becherer: Treating Carigan Lister/Extender: Aurelio Brash in Treatment: 12 Active Inactive Necrotic Tissue Nursing Diagnoses: Knowledge deficit related to management of necrotic/devitalized tissue Goals: Necrotic/devitalized tissue will be minimized in the wound bed Date Initiated: 08/08/2022 Target Resolution Date: 01/13/2023 Goal Status: Active Patient/caregiver will verbalize understanding of reason and process for debridement of necrotic tissue Date Initiated: 08/08/2022 Target Resolution Date: 01/13/2023 Goal Status:  Active Interventions: Assess patient pain level pre-, during and post procedure and prior to discharge Provide education on necrotic tissue and debridement process Notes: Nutrition Nursing Diagnoses: Impaired glucose control: actual or potential Goals: Patient/caregiver agrees to and verbalizes understanding of need to obtain nutritional consultation Date Initiated: 08/08/2022 Target Resolution Date: 01/13/2023 Goal Status: Active Patient/caregiver will maintain therapeutic glucose control Date Initiated: 08/08/2022 Target Resolution Date: 01/13/2023 Goal Status: Active Interventions: Assess HgA1c results as ordered upon admission and as needed Provide education on elevated blood sugars and impact on wound healing Provide education on nutrition Treatment Activities: Obtain HgA1c : 08/08/2022 Patient referred to Primary Care Physician for further nutritional evaluation : 08/08/2022 Notes: Pain, Acute or Chronic Nursing Diagnoses: Pain, acute or chronic: actual or potential Potential alteration in comfort, pain Goals: Patient will verbalize adequate pain control and receive pain control interventions during procedures as needed Date Initiated: 08/08/2022 Target Resolution Date: 01/13/2023 Goal Status: Active Patient/caregiver will verbalize comfort level met Date Initiated: 08/08/2022 Target Resolution Date: 01/13/2023 Goal Status:  Active Interventions: PESSIE, POULIOT ([id number]) [phone].pdf Page 5 of 8 Complete pain assessment as per visit requirements Encourage patient to take pain medications as prescribed Provide education on pain management Treatment Activities: Administer pain control measures as ordered : 08/08/2022 Notes: Wound/Skin Impairment Nursing Diagnoses: Knowledge deficit related to ulceration/compromised skin integrity Goals: Patient/caregiver will verbalize understanding of skin care regimen Date Initiated: 08/08/2022 Target  Resolution Date: 01/13/2023 Goal Status: Active Ulcer/skin breakdown will heal within 14 weeks Date Initiated: 08/08/2022 Target Resolution Date: 01/13/2023 Goal Status: Active Interventions: Assess patient/caregiver ability to perform ulcer/skin care regimen upon admission and as needed Assess ulceration(s) every visit Provide education on ulcer and skin care Treatment Activities: Skin care regimen initiated : 08/08/2022 Topical wound management initiated : 08/08/2022 Notes: Electronic Signature(s) Signed: 10/31/2022 6:11:41 PM By: Shawn Stall RN, BSN Entered By: Shawn Stall on 10/31/2022 10:07:16 -------------------------------------------------------------------------------- Pain Assessment Details Patient Name: Date of Service: [name] [name], [name] 10/31/2022 12:30 PM Medical Record Number: [id number] Patient Account Number: [id number] Date of Birth/Sex: Treating RN: [birth year] (42 y.o. F) Primary Care Brison Fiumara: Gwinda Passe Other Clinician: Referring Ranbir Chew: Treating Geraldene Eisel/Extender: Aurelio Brash in Treatment: 12 Active Problems Location of Pain Severity and Description of Pain Patient Has Paino No Site Locations MARRINA, KELTY L ([id number]) [id number] Page 6 of 8 Pain Management and Medication Current Pain Management: Electronic Signature(s) Signed: 11/13/2022 4:41:33 PM By: Thayer Dallas Entered By: Thayer Dallas on 10/31/2022 09:49:48 -------------------------------------------------------------------------------- Patient/Caregiver Education Details Patient Name: Date of Service: [name] [name], [name] 10/17/2024andnbsp12:30 PM Medical Record Number: [id number] Patient Account Number: [id number] Date of Birth/Gender: Treating RN: Mar 18, 1980 (42 y.o. [name] Primary Care Physician: Gwinda Passe Other Clinician: Referring Physician: Treating Physician/Extender: Aurelio Brash in Treatment: 12 Education Assessment Education Provided To: Patient Education Topics Provided Wound/Skin Impairment: Handouts: Caring for Your Ulcer Methods: Explain/Verbal Responses: Reinforcements needed Electronic Signature(s) Signed: 10/31/2022 6:11:41 PM By: Shawn Stall RN, BSN Entered By: Shawn Stall on 10/31/2022 10:07:26 -------------------------------------------------------------------------------- Wound Assessment Details Patient Name: Date of Service: [name] V IS, [name] 10/31/2022 12:30 PM Medical Record Number: [id number] Patient Account Number: [id number] Date of Birth/Sex: Treating RN: 1980-03-26 (43 y.o. F) Primary Care Redina Zeller: Gwinda Passe Other Clinician: Referring Florentina Marquart: Treating Carreen Milius/Extender: Aurelio Brash in Treatment: 12 Wound Status Wound Number: 4 Primary Diabetic Wound/Ulcer of the Lower Extremity Etiology: Wound Location: Right Amputation Site - Transmetatarsal Secondary Dehisced Wound Wounding Event: Shear/Friction Etiology: Date Acquired: 07/08/2022 Wound Status: Open Weeks Of Treatment: 12 Comorbid Deep Vein Thrombosis, Hypertension, Type II Diabetes, Clustered Wound: No History: Osteomyelitis, Neuropathy Photos Ziemba, Lenisha L ([id number]) [id number] Page 7 of 8 Wound Measurements Length: (cm) 2.8 Width: (cm) 0.9 Depth: (cm) 1 Area: (cm) 1.979 Volume: (cm) 1.979 % Reduction in Area: -799.5% % Reduction in Volume: -1024.4% Epithelialization: Small (1-33%) Tunneling: No Undermining: No Wound Description Classification: Grade 3 Wound Margin: Distinct, outline attached Exudate Amount: Medium Exudate Type: Serosanguineous Exudate Color: red, brown Foul Odor After Cleansing: No Slough/Fibrino Yes Wound Bed Granulation Amount: Medium (34-66%) Exposed Structure Granulation Quality: Red Fascia Exposed: No Necrotic Amount: Medium (34-66%) Fat Layer  (Subcutaneous Tissue) Exposed: Yes Necrotic Quality: Adherent Slough Tendon Exposed: No Muscle Exposed: No Joint Exposed: No Bone Exposed: No Periwound Skin Texture Texture Color No Abnormalities Noted: No No Abnormalities Noted: No Callus: Yes Atrophie Blanche: No Crepitus: No Cyanosis: No Excoriation: No Ecchymosis: No Induration: No Erythema: No Rash: No Hemosiderin Staining: No Scarring: No Mottled:  No Pallor: No Moisture Rubor: No No Abnormalities Noted: No Dry / Scaly: No Temperature / Pain Maceration: Yes Tenderness on Palpation: Yes Electronic Signature(s) Signed: 11/13/2022 4:41:33 PM By: Thayer Dallas Entered By: Thayer Dallas on 10/31/2022 09:57:21 -------------------------------------------------------------------------------- Vitals Details Patient Name: Date of Service: [name] V IS, [name] 10/31/2022 12:30 PM Medical Record Number: [id number] Patient Account Number: [id number] Date of Birth/Sex: Treating RN: [birth year] (42 y.o. F) Primary Care Lonnette Shrode: Gwinda Passe Other Clinician: Referring Janique Hoefer: Treating Kamille Toomey/Extender: Aurelio Brash in Treatment: 12 Vital Signs Dohn, Sharol Harness ([id number]) [id number] Page 8 of 8 Time Taken: 12:49 Temperature (F): 97.9 Height (in): 69 Pulse (bpm): 85 Weight (lbs): 330 Respiratory Rate (breaths/min): 18 Body Mass Index (BMI): 48.7 Blood Pressure (mmHg): 126/85 Reference Range: 80 - 120 mg / dl Electronic Signature(s) Signed: 11/13/2022 4:41:33 PM By: Thayer Dallas Entered By: Thayer Dallas on 10/31/2022 09:49:36

## 2022-11-19 ENCOUNTER — Other Ambulatory Visit (HOSPITAL_COMMUNITY)
Admission: RE | Admit: 2022-11-19 | Discharge: 2022-11-19 | Disposition: A | Discharge status: Home / Self Care | Payer: Medicaid Other | Source: Ambulatory Visit | Attending: Internal Medicine | Admitting: Internal Medicine

## 2022-11-19 DIAGNOSIS — M86671 Other chronic osteomyelitis, right ankle and foot: Secondary | ICD-10-CM | POA: Insufficient documentation

## 2022-11-19 LAB — CBC WITH DIFFERENTIAL/PLATELET
Abs Immature Granulocytes: 0.03 10*3/uL (ref 0.00–0.07)
Basophils Absolute: 0 10*3/uL (ref 0.0–0.1)
Basophils Relative: 0 %
Eosinophils Absolute: 0.1 10*3/uL (ref 0.0–0.5)
Eosinophils Relative: 1 %
HCT: 39.2 % (ref 36.0–46.0)
Hemoglobin: 12.6 g/dL (ref 12.0–15.0)
Immature Granulocytes: 0 %
Lymphocytes Relative: 22 %
Lymphs Abs: 1.8 10*3/uL (ref 0.7–4.0)
MCH: 27.6 pg (ref 26.0–34.0)
MCHC: 32.1 g/dL (ref 30.0–36.0)
MCV: 86 fL (ref 80.0–100.0)
Monocytes Absolute: 0.7 10*3/uL (ref 0.1–1.0)
Monocytes Relative: 8 %
Neutro Abs: 5.8 10*3/uL (ref 1.7–7.7)
Neutrophils Relative %: 69 %
Platelets: 229 10*3/uL (ref 150–400)
RBC: 4.56 MIL/uL (ref 3.87–5.11)
RDW: 14.7 % (ref 11.5–15.5)
WBC: 8.4 10*3/uL (ref 4.0–10.5)
nRBC: 0 % (ref 0.0–0.2)

## 2022-11-19 LAB — SEDIMENTATION RATE: Sed Rate: 43 mm/h — ABNORMAL HIGH (ref 0–22)

## 2022-11-19 LAB — C-REACTIVE PROTEIN: CRP: 4.1 mg/dL — ABNORMAL HIGH (ref <1.0)

## 2022-11-21 ENCOUNTER — Encounter (HOSPITAL_BASED_OUTPATIENT_CLINIC_OR_DEPARTMENT_OTHER): Payer: Medicaid Other | Attending: Internal Medicine | Admitting: Internal Medicine

## 2022-11-21 DIAGNOSIS — E11621 Type 2 diabetes mellitus with foot ulcer: Secondary | ICD-10-CM | POA: Insufficient documentation

## 2022-11-21 DIAGNOSIS — E1140 Type 2 diabetes mellitus with diabetic neuropathy, unspecified: Secondary | ICD-10-CM | POA: Diagnosis not present

## 2022-11-21 DIAGNOSIS — L97512 Non-pressure chronic ulcer of other part of right foot with fat layer exposed: Secondary | ICD-10-CM | POA: Diagnosis not present

## 2022-11-21 DIAGNOSIS — E1151 Type 2 diabetes mellitus with diabetic peripheral angiopathy without gangrene: Secondary | ICD-10-CM | POA: Diagnosis not present

## 2022-11-21 DIAGNOSIS — M86671 Other chronic osteomyelitis, right ankle and foot: Secondary | ICD-10-CM | POA: Diagnosis not present

## 2022-11-21 DIAGNOSIS — Z794 Long term (current) use of insulin: Secondary | ICD-10-CM | POA: Diagnosis not present

## 2022-11-21 DIAGNOSIS — Z89431 Acquired absence of right foot: Secondary | ICD-10-CM | POA: Insufficient documentation

## 2022-11-21 DIAGNOSIS — G8929 Other chronic pain: Secondary | ICD-10-CM | POA: Insufficient documentation

## 2022-11-22 NOTE — Progress Notes (Signed)
[name], [name] ([id number]) [id number] Page 1 of 9 Visit Report for 11/21/2022 Arrival Information Details Patient Name: Date of Service: [name] IS, [place] [name] 11/21/2022 2:00 PM Medical Record Number: [id number] Patient Account Number: [id number] Date of Birth/Sex: Treating RN: 1980/01/27 (42 y.o. [name], [name] Primary Care Kanav Kazmierczak: Gwinda Passe Other Clinician: Referring Neilson Oehlert: Treating Amali Uhls/Extender: Aurelio Brash in Treatment: 15 Visit Information History Since Last Visit All ordered tests and consults were completed: Yes Patient Arrived: Wheel Chair Added or deleted any medications: No Arrival Time: 14:03 Any new allergies or adverse reactions: No Accompanied By: self Had a fall or experienced change in No Transfer Assistance: None activities of daily living that may affect Patient Identification Verified: Yes risk of falls: Secondary Verification Process Completed: Yes Signs or symptoms of abuse/neglect since No Patient Requires Transmission-Based Precautions: No last visito Patient Has Alerts: Yes Hospitalized since last visit: No Patient Alerts: Patient on Blood Thinner Implantable device outside of the clinic No Eliquis excluding ABI R 1.02 (08/21/22) cellular tissue based products placed in the ABI L 0.99 (08/21/22) center since last visit: Has Dressing in Place as Prescribed: Yes Has Footwear/Offloading in Place as Yes Prescribed: Right: Surgical Shoe with Pressure Relief Insole Pain Present Now: No Electronic Signature(s) Signed: 11/21/2022 6:10:25 PM By: Shawn Stall RN, BSN Entered By: Shawn Stall on 11/21/2022 14:04:14 -------------------------------------------------------------------------------- Encounter Discharge Information Details Patient Name: Date of Service: [name], [name] 11/21/2022 2:00 PM Medical Record Number: [id number] Patient Account Number: [id number] Date of Birth/Sex:  Treating RN: 25-Oct-1980 (42 y.o. [name] Primary Care Kellyn Mansfield: Gwinda Passe Other Clinician: Referring Molleigh Huot: Treating Favian Kittleson/Extender: Aurelio Brash in Treatment: 15 Encounter Discharge Information Items Post Procedure Vitals Discharge Condition: Stable Temperature (F): 98.6 Ambulatory Status: Wheelchair Pulse (bpm): 92 Discharge Destination: Home Respiratory Rate (breaths/min): 20 Transportation: Private Auto Blood Pressure (mmHg): 124/82 Accompanied By: self Schedule Follow-up Appointment: Yes Clinical Summary of Care: Electronic Signature(s) Signed: 11/21/2022 6:10:25 PM By: Shawn Stall RN, BSN Entered By: Shawn Stall on 11/21/2022 15:36:38 Grieb, Sharol Harness ([id number]) [id number] Page 2 of 9 -------------------------------------------------------------------------------- Lower Extremity Assessment Details Patient Name: Date of Service: [name] IS, [name] 11/21/2022 2:00 PM Medical Record Number: [id number] Patient Account Number: [id number] Date of Birth/Sex: Treating RN: [birth year] (42 y.o. [name] Primary Care [name]: Gwinda Passe Other Clinician: Referring Maritza Goldsborough: Treating Lesle Faron/Extender: Aurelio Brash in Treatment: 15 Edema Assessment Assessed: Kyra Searles: No] Franne Forts: Yes] Edema: [Left: Ye] [Right: s] Calf Left: Right: Point of Measurement: 31 cm From Medial Instep 51 cm Ankle Left: Right: Point of Measurement: 9 cm From Medial Instep 28.5 cm Vascular Assessment Pulses: Dorsalis Pedis Palpable: [Right:Yes] Extremity colors, hair growth, and conditions: Extremity Color: [Right:Hyperpigmented] Hair Growth on Extremity: [Right:No] Temperature of Extremity: [Right:Warm] Capillary Refill: [Right:< 3 seconds] Dependent Rubor: [Right:No] Blanched when Elevated: [Right:No Yes] Electronic Signature(s) Signed: 11/21/2022 6:10:25 PM By: Shawn Stall  RN, BSN Entered By: Shawn Stall on 11/21/2022 14:06:26 -------------------------------------------------------------------------------- Multi Wound Chart Details Patient Name: Date of Service: [name], [name] 11/21/2022 2:00 PM Medical Record Number: [id number] Patient Account Number: [id number] Date of Birth/Sex: Treating RN: [birth year] (42 y.o. F) Primary Care [name]: Gwinda Passe Other Clinician: Referring [name]: Treating [name]/Extender: Aurelio Brash in Treatment: 15 Vital Signs Height(in): 69 Pulse(bpm): 92 Weight(lbs): 330 Blood Pressure(mmHg): 124/82 Body Mass Index(BMI): 48.7 Temperature(F): 98.6 Respiratory Rate(breaths/min): 20 [[name], [name] L ([id number]):Photos:] [[id number] Page 3 of 9:4  N/A N/A N/A N/A] Right Amputation Site - N/A N/A Wound Location: Transmetatarsal Shear/Friction N/A N/A Wounding Event: Diabetic Wound/Ulcer of the Lower N/A N/A Primary Etiology: Extremity Dehisced Wound N/A N/A Secondary Etiology: Deep Vein Thrombosis, Hypertension, N/A N/A Comorbid History: Type II Diabetes, Osteomyelitis, Neuropathy 07/08/2022 N/A N/A Date Acquired: 15 N/A N/A Weeks of Treatment: Open N/A N/A Wound Status: No N/A N/A Wound Recurrence: 1x0.5x1.4 N/A N/A Measurements L x W x D (cm) 0.393 N/A N/A A (cm) : rea 0.55 N/A N/A Volume (cm) : -78.60% N/A N/A % Reduction in A rea: -212.50% N/A N/A % Reduction in Volume: 1 Starting Position 1 (o'clock): 6 Ending Position 1 (o'clock): 1 Maximum Distance 1 (cm): Yes N/A N/A Undermining: Grade 3 N/A N/A Classification: Medium N/A N/A Exudate A mount: Serosanguineous N/A N/A Exudate Type: red, brown N/A N/A Exudate Color: Distinct, outline attached N/A N/A Wound Margin: Large (67-100%) N/A N/A Granulation A mount: Pink, Pale N/A N/A Granulation Quality: Small (1-33%) N/A N/A Necrotic A mount: Fat Layer (Subcutaneous  Tissue): Yes N/A N/A Exposed Structures: Fascia: No Tendon: No Muscle: No Joint: No Bone: No Small (1-33%) N/A N/A Epithelialization: Debridement - Excisional N/A N/A Debridement: Pre-procedure Verification/Time Out 14:22 N/A N/A Taken: Lidocaine 4% Topical Solution N/A N/A Pain Control: Subcutaneous N/A N/A Tissue Debrided: Skin/Subcutaneous Tissue N/A N/A Level: 0.39 N/A N/A Debridement A (sq cm): rea Curette N/A N/A Instrument: Minimum N/A N/A Bleeding: Pressure N/A N/A Hemostasis A chieved: Procedure was tolerated well N/A N/A Debridement Treatment Response: 1x0.5x1.4 N/A N/A Post Debridement Measurements L x W x D (cm) 0.55 N/A N/A Post Debridement Volume: (cm) Callus: Yes N/A N/A Periwound Skin Texture: Excoriation: No Induration: No Crepitus: No Rash: No Scarring: No Maceration: Yes N/A N/A Periwound Skin Moisture: Dry/Scaly: No Atrophie Blanche: No N/A N/A Periwound Skin Color: Cyanosis: No Ecchymosis: No Erythema: No Hemosiderin Staining: No Mottled: No Pallor: No Rubor: No Yes N/A N/A Tenderness on Palpation: Debridement N/A N/A Procedures Performed: Treatment Notes Wound #4 (Amputation Site - Transmetatarsal) Wound Laterality: Right Munday, Sabryn L ([id number]) [id number] Page 4 of 9 Cleanser Soap and Water Discharge Instruction: May shower and wash wound with dial antibacterial soap and water prior to dressing change. On days not changing the dressing, please keep wound/dressing dry Peri-Wound Care Topical Gentamicin Discharge Instruction: As directed by physician Primary Dressing Maxorb Extra Ag+ Alginate Dressing, 4x4.75 (in/in) Discharge Instruction: Apply to wound bed as instructed Secondary Dressing ABD Pad, 8x10 Discharge Instruction: Apply over primary dressing as directed. Secured With [employer], 4.5x3.1 (in/yd) Discharge Instruction: Secure with Kerlix as directed. 28M Medipore H Soft  Cloth Surgical T ape, 4 x 10 (in/yd) Discharge Instruction: Secure with tape as directed. ace wrap Discharge Instruction: apply lightly to hold dressing in place. Compression Wrap Compression Stockings Add-Ons Electronic Signature(s) Signed: 11/21/2022 4:34:18 PM By: Baltazar Najjar MD Entered By: Baltazar Najjar on 11/21/2022 16:10:46 -------------------------------------------------------------------------------- Multi-Disciplinary Care Plan Details Patient Name: Date of Service: [name] IS, [name] 11/21/2022 2:00 PM Medical Record Number: [id number] Patient Account Number: [id number] Date of Birth/Sex: Treating RN: 29-Aug-1980 (42 y.o. Katrinka Blazing Primary Care Chritopher Coster: Gwinda Passe Other Clinician: Referring Siarah Deleo: Treating Levon Boettcher/Extender: Aurelio Brash in Treatment: 15 Active Inactive Necrotic Tissue Nursing Diagnoses: Knowledge deficit related to management of necrotic/devitalized tissue Goals: Necrotic/devitalized tissue will be minimized in the wound bed Date Initiated: 08/08/2022 Target Resolution Date: 01/13/2024 Goal Status: Active Patient/caregiver will verbalize understanding of reason and process for debridement of  necrotic tissue Date Initiated: 08/08/2022 Target Resolution Date: 01/13/2024 Goal Status: Active Interventions: Assess patient pain level pre-, during and post procedure and prior to discharge Provide education on necrotic tissue and debridement process BALIE, HERRY L ([id number]) [id number] Page 5 of 9 Notes: Nutrition Nursing Diagnoses: Impaired glucose control: actual or potential Goals: Patient/caregiver agrees to and verbalizes understanding of need to obtain nutritional consultation Date Initiated: 08/08/2022 Target Resolution Date: 01/13/2024 Goal Status: Active Patient/caregiver will maintain therapeutic glucose control Date Initiated: 08/08/2022 Target Resolution Date:  01/13/2024 Goal Status: Active Interventions: Assess HgA1c results as ordered upon admission and as needed Provide education on elevated blood sugars and impact on wound healing Provide education on nutrition Treatment Activities: Obtain HgA1c : 08/08/2022 Patient referred to Primary Care Physician for further nutritional evaluation : 08/08/2022 Notes: Pain, Acute or Chronic Nursing Diagnoses: Pain, acute or chronic: actual or potential Potential alteration in comfort, pain Goals: Patient will verbalize adequate pain control and receive pain control interventions during procedures as needed Date Initiated: 08/08/2022 Target Resolution Date: 01/13/2024 Goal Status: Active Patient/caregiver will verbalize comfort level met Date Initiated: 08/08/2022 Target Resolution Date: 01/13/2024 Goal Status: Active Interventions: Complete pain assessment as per visit requirements Encourage patient to take pain medications as prescribed Provide education on pain management Treatment Activities: Administer pain control measures as ordered : 08/08/2022 Notes: Wound/Skin Impairment Nursing Diagnoses: Knowledge deficit related to ulceration/compromised skin integrity Goals: Patient/caregiver will verbalize understanding of skin care regimen Date Initiated: 08/08/2022 Target Resolution Date: 01/13/2024 Goal Status: Active Ulcer/skin breakdown will heal within 14 weeks Date Initiated: 08/08/2022 Target Resolution Date: 01/13/2024 Goal Status: Active Interventions: Assess patient/caregiver ability to perform ulcer/skin care regimen upon admission and as needed Assess ulceration(s) every visit Provide education on ulcer and skin care Treatment Activities: Skin care regimen initiated : 08/08/2022 Topical wound management initiated : 08/08/2022 Notes: Electronic Signature(s) Signed: 11/21/2022 6:28:11 PM By: Karie Schwalbe RN Belgrave, Tymia L11/07/2022 6:28:11 PM By: Karie Schwalbe RN Signed:  ([id number]) [id number] Page 6 of 9 Entered By: Karie Schwalbe on 11/21/2022 17:27:10 -------------------------------------------------------------------------------- Pain Assessment Details Patient Name: Date of Service: [name] IS, [name] 11/21/2022 2:00 PM Medical Record Number: [id number] Patient Account Number: [id number] Date of Birth/Sex: Treating RN: July 26, 1980 (42 y.o. [name] Primary Care Deysi Soldo: Gwinda Passe Other Clinician: Referring Navaya Wiatrek: Treating Atina Feeley/Extender: Aurelio Brash in Treatment: 15 Active Problems Location of Pain Severity and Description of Pain Patient Has Paino No Site Locations Pain Management and Medication Current Pain Management: Electronic Signature(s) Signed: 11/21/2022 6:10:25 PM By: Shawn Stall RN, BSN Entered By: Shawn Stall on 11/21/2022 14:05:27 -------------------------------------------------------------------------------- Patient/Caregiver Education Details Patient Name: Date of Service: [name], [name] 11/7/2024andnbsp2:00 PM Medical Record Number: [id number] Patient Account Number: [id number] Date of Birth/Gender: Treating RN: Jun 02, 1980 (42 y.o. Katrinka Blazing Primary Care Physician: Gwinda Passe Other Clinician: Referring Physician: Treating Physician/Extender: Aurelio Brash in Treatment: 15 Education Assessment Education Provided To: Patient Education Topics Provided SARINE, LIVING ([id number]) [id number] Page 7 of 9 Electronic Signature(s) Signed: 11/21/2022 6:28:11 PM By: Karie Schwalbe RN Entered By: Karie Schwalbe on 11/21/2022 17:27:20 -------------------------------------------------------------------------------- Wound Assessment Details Patient Name: Date of Service: [name], [name] 11/21/2022 2:00 PM Medical Record Number: [id number] Patient Account Number: [id number] Date  of Birth/Sex: Treating RN: 1980/09/15 (42 y.o. [name] Primary Care Eliette Drumwright: Gwinda Passe Other Clinician: Referring Markeya Mincy: Treating Evadene Wardrip/Extender: Aurelio Brash in Treatment: 15 Wound Status Wound Number: 4  Primary Diabetic Wound/Ulcer of the Lower Extremity Etiology: Wound Location: Right Amputation Site - Transmetatarsal Secondary Dehisced Wound Wounding Event: Shear/Friction Etiology: Date Acquired: 07/08/2022 Wound Status: Open Weeks Of Treatment: 15 Comorbid Deep Vein Thrombosis, Hypertension, Type II Diabetes, Clustered Wound: No History: Osteomyelitis, Neuropathy Photos Wound Measurements Length: (cm) 1 Width: (cm) 0.5 Depth: (cm) 1.4 Area: (cm) 0.393 Volume: (cm) 0.55 % Reduction in Area: -78.6% % Reduction in Volume: -212.5% Epithelialization: Small (1-33%) Tunneling: No Undermining: Yes Starting Position (o'clock): 1 Ending Position (o'clock): 6 Maximum Distance: (cm) 1 Wound Description Classification: Grade 3 Wound Margin: Distinct, outline attached Exudate Amount: Medium Exudate Type: Serosanguineous Exudate Color: red, brown Foul Odor After Cleansing: No Slough/Fibrino Yes Wound Bed Granulation Amount: Large (67-100%) Exposed Structure Granulation Quality: Pink, Pale Fascia Exposed: No Necrotic Amount: Small (1-33%) Fat Layer (Subcutaneous Tissue) Exposed: Yes Necrotic Quality: Adherent Slough Tendon Exposed: No Muscle Exposed: No Joint Exposed: No Bone Exposed: No Periwound Skin Texture Bilski, Alleyne L ([id number]) [id number] Page 8 of 9 Texture Color No Abnormalities Noted: No No Abnormalities Noted: No Callus: Yes Atrophie Blanche: No Crepitus: No Cyanosis: No Excoriation: No Ecchymosis: No Induration: No Erythema: No Rash: No Hemosiderin Staining: No Scarring: No Mottled: No Pallor: No Moisture Rubor: No No Abnormalities Noted: No Dry / Scaly: No  Temperature / Pain Maceration: Yes Tenderness on Palpation: Yes Treatment Notes Wound #4 (Amputation Site - Transmetatarsal) Wound Laterality: Right Cleanser Soap and Water Discharge Instruction: May shower and wash wound with dial antibacterial soap and water prior to dressing change. On days not changing the dressing, please keep wound/dressing dry Peri-Wound Care Topical Gentamicin Discharge Instruction: As directed by physician Primary Dressing Maxorb Extra Ag+ Alginate Dressing, 4x4.75 (in/in) Discharge Instruction: Apply to wound bed as instructed Secondary Dressing ABD Pad, 8x10 Discharge Instruction: Apply over primary dressing as directed. Secured With [employer], 4.5x3.1 (in/yd) Discharge Instruction: Secure with Kerlix as directed. 76M Medipore H Soft Cloth Surgical T ape, 4 x 10 (in/yd) Discharge Instruction: Secure with tape as directed. ace wrap Discharge Instruction: apply lightly to hold dressing in place. Compression Wrap Compression Stockings Add-Ons Electronic Signature(s) Signed: 11/21/2022 6:10:25 PM By: Shawn Stall RN, BSN Signed: 11/21/2022 6:28:11 PM By: Karie Schwalbe RN Entered By: Karie Schwalbe on 11/21/2022 14:30:03 -------------------------------------------------------------------------------- Vitals Details Patient Name: Date of Service: [name], [name] 11/21/2022 2:00 PM Medical Record Number: [id number] Patient Account Number: [id number] Date of Birth/Sex: Treating RN: [birth year] (42 y.o. [name] Primary Care Wylie Russon: Gwinda Passe Other Clinician: Referring Jaleeya Mcnelly: Treating Avriel Kandel/Extender: Aurelio Brash in Treatment: 15 Vital Signs Time Taken: 14:04 Temperature (F): 98.6 Vanstone, Stefany L ([id number]) [id number] Page 9 of 9 Height (in): 69 Pulse (bpm): 92 Weight (lbs): 330 Respiratory Rate (breaths/min): 20 Body Mass Index (BMI): 48.7 Blood Pressure  (mmHg): 124/82 Reference Range: 80 - 120 mg / dl Electronic Signature(s) Signed: 11/21/2022 6:10:25 PM By: Shawn Stall RN, BSN Entered By: Shawn Stall on 11/21/2022 14:05:14

## 2022-11-25 NOTE — Progress Notes (Addendum)
[name] ([id number]) [id number] Page 1 of 10 Visit Report for 11/21/2022 Debridement Details Patient Name: Date of Service: [name], [name]. 11/21/2022 2:00 PM Medical Record Number: [id number] Patient Account Number: [id number] Date of Birth/Sex: Treating RN: 08-03-1980 (42 y.o. F) Primary Care Provider: Gwinda Passe Other Clinician: Referring Provider: Treating Provider/Extender: Aurelio Brash in Treatment: 15 Debridement Performed for Assessment: Wound #4 Right Amputation Site - Transmetatarsal Performed By: Physician Maxwell Caul., MD The following information was scribed by: Karie Schwalbe The information was scribed for: [name] Debridement Type: Debridement Severity of Tissue Pre Debridement: Fat layer exposed Level of Consciousness (Pre-procedure): Awake and Alert Pre-procedure Verification/Time Out Yes - 14:22 Taken: Start Time: 14:22 Pain Control: Lidocaine 4% T opical Solution Percent of Wound Bed Debrided: 100% T Area Debrided (cm): otal 0.39 Tissue and other material debrided: Viable, Non-Viable, Subcutaneous, Skin: Dermis , Skin: Epidermis Level: Skin/Subcutaneous Tissue Debridement Description: Excisional Instrument: Curette Bleeding: Minimum Hemostasis Achieved: Pressure Response to Treatment: Procedure was tolerated well Level of Consciousness (Post- Awake and Alert procedure): Post Debridement Measurements of Total Wound Length: (cm) 1 Width: (cm) 0.5 Depth: (cm) 1.4 Volume: (cm) 0.55 Character of Wound/Ulcer Post Debridement: Improved Severity of Tissue Post Debridement: Fat layer exposed Post Procedure Diagnosis Same as Pre-procedure Electronic Signature(s) Signed: 11/21/2022 4:34:18 PM By: [name] Najjar MD Entered By: [name] on 11/21/2022 13:10:56 -------------------------------------------------------------------------------- HPI Details Patient Name: Date  of Service: [name] V [name], [name]. 11/21/2022 2:00 PM Medical Record Number: [id number] Patient Account Number: [id number] Date of Birth/Sex: Treating RN: 1980/04/07 (42 y.o. F) Primary Care Provider: Gwinda Passe Other Clinician: Referring Provider: Treating Provider/Extender: Aurelio Brash in Treatment: 15 History of Present Illness [name], [name] ([id number]) [id number] Page 2 of 10 HPI Description: Admission 01/22/2021 [name] a 42 year old female with a past medical history of insulin-dependent uncontrolled type 2 diabetes with last hemoglobin A1c of 13.5, osteomyelitis of the right foot status post transmetatarsal amputation on 12/18/2020 that presents to the clinic for right foot wound. She has had dehiscence of the surgical site. [name] using wet-to-dry dressings. She has a PICC line and receiving IV ceftriaxone daily for her osteomyelitis. There [name] an end date of 01/27/2021. [name] also taking oral metronidazole. She currently denies systemic signs of infection. 1/19; patient presents for follow-up. She was diagnosed with a DVT to the right lower extremity 2 days ago. [name] on Eliquis now. [name] scheduled to see her infectious disease doctor tomorrow. She has been using Dakin's wet-to-dry dressings. She denies systemic signs of infection. 1/26; patient presents for follow-up. She saw infectious disease on 1/21 started on Augmentin. Her PICC line and IV ceftriaxone was discontinued. Patient reports stability to her wound. She has been using Dakin's wet-to-dry dressings. She currently denies systemic signs of infection. 2/3; patient presents for follow-up. She continues to use Dakin's wet-to-dry dressings to the wound bed. She saw Dr. Manson Passey with infectious disease yesterday and [name] continuing Augmentin. T entative end date [name] 2/16. Patient reports following up with orthopedics. She states there [name] no further plan  from them. She currently denies systemic signs of infection. 2/10; patient presents for follow-up. She continues to use Dakin's wet to dry dressings. [name] scheduled to have her MRI done on 2/14. She states that she had pain to the debridement site from last clinic visit and declines debridement today. She denies systemic signs of infection. She continues to  have yellow thick drainage. 2/20; patient presents for follow-up. She continues to use Dakin's wet-to-dry dressings. She obtained her MRI. The results showed an abscess and [name] scheduled to see her orthopedic surgeon on 2/23. She saw infectious disease 2/17 and her antibiotics were extended. She currently denies systemic signs of infection. 3/6; patient presents for follow-up. She had debridement and irrigation of her foot on 03/10/2021 due to abscess noted on MRI. She was started on IV ceftriaxone and oral Flagyl. She has no issues or complaints today. She has been using iodoform packing to the tunnel and Dakin's wet-to-dry to the opening. 03/26/2021: She continues on IV ceftriaxone and oral metronidazole. She has follow-up with infectious disease tomorrow. No significant issues or complaints today. Her mother continues to help her with her wound dressing, using iodoform packing strips into the tunnel and Dakin's to the open portion of the wound. 3/20; patient presents for follow-up. She continues to be on IV ceftriaxone in oral metronidazole. She has been using iodoform to the tunnel and Dakin's wet-to- dry to the open wound. She denies signs of infection. 3/27; patient presents for follow-up. She no longer has a PICC line. She has been using iodoform to the tunnel and Dakin's wet-to-dry to the open wound. She reports improvement in wound healing. She denies signs of infection. 4/3; patient presents for follow-up. She states she has been using Hydrofera Blue to the open wound and iodoform packing to the tunnel without any issues. She denies  signs of infection. 4/18; patient presents for follow-up. She saw infectious disease on 4/11. She has finished her oral antibiotics and completed a total of 6 weeks of antibiotics (this includes IV as well). No further antibiotics needed. She has been using Hydrofera Blue and iodoform packing. She states that the tunneled area has come in and the iodoform [name] not staying in place anymore. She has no issues or complaints today. She denies signs of infection. 4/24; patient presents for follow-up. She saw Dr. Carlene Coria, plastic surgery to discuss potential skin graft/substitute placement. At this time he thinks that the skin graft would likely not take. He [name] in agreement with trying a wound VAC. Patient has been using Hydrofera Blue dressing changes with no issues. She denies signs of infection. She reports improvement in wound healing. 5/1; patient presents for follow-up. Unfortunately patient did not have insurance when we ran for the pico. There [name] an assistance program and we are trying to get this accommodated for the patient. In the meantime she has been using Hydrofera Blue without any issues. She denies signs of infection. 5/8; patient presents for follow-up. We have not heard back if pico [name] covered by her insurance. She has been using collagen to the wound bed over the past week. She denies signs of infection. 5/18; patient presents for follow-up. She has been using collagen to the wound bed without issues. Again we have not heard if pico [name] covered by her insurance. She has no issues or complaints today. 5/23; patient presents for follow-up. She has been using collagen to the wound bed. She has no issues or complaints today. She obtained the wound VAC from [facility] and brought this in today. She denies signs of infection. 6/1; patient presents for follow-up. She has been using the wound VAC for the past week. She has had this changed twice since she was last here. She reports more maceration to the  periwound. She denies signs of infection. 6/7; right TMA site. There are 2 wounds  0 separated by a bridge of healed tissue. The more lateral area has undermining. Both areas have healthy looking granulation at the base but relative the size of the wound [name] fairly deep. There [name] no exposed bone no evidence of infection. Her wound VAC was put on hold last week because of surrounding skin maceration she has been using collagen this week. She has a modified shoe 6/12; patient presents for follow-up. Last week the wound VAC was reinitiated. She had no issues with the wound VAC itself. Today she has maceration again noted to the surrounding skin. She denies signs of infection. 6/27; patient presents for follow-up. She has been using Medihoney to the wound bed. We took a break from the wound VAC because the periwound was macerated. She still has some areas of maceration to the distal foot where there [name] a callus. She currently denies signs of infection. 7/11; patient presents for follow-up. She has been using Medihoney to the wound bed. She has developed some increased warmth and erythema to the lateral aspect of the right foot. She states this [name] occurred over the past week and there [name] increased pain. No drainage noted. 7/17; patient presents for follow-up. She has been using Medihoney to the wound bed. She completed her course of Bactrim. She reports improvement in symptoms. 7/24; Patient presents for follow up. She has been using Medihoney to the wound bed without issues. She completed another course of Bactrim. She reports improvement in her symptoms but still has some mild tenderness to the medial aspect of the foot. 7/31; Patient presents for follow-up. She has been using Medihoney and Dakin's to the wound bed. She denies signs of infection. 8/14; patient presents for follow-up. She has been using Dakin's wet-to-dry packing strips to the right medial aspect of the amputation site and Medihoney to the  anterior site. She has no issues or complaints today. She has started physical therapy. She denies signs of infection. 8/29; patient presents for follow-up. She has been using Dakin's wet-to-dry packing strips to the right medial aspect of the amputation site however this [name] becoming more difficult to place. She did report that she had increased redness and swelling to that site and developed some drainage. It has resolved. She continues with physical therapy. NIVAYA, POTEETE ([id number]) [id number] Page 3 of 10 9/8; patient presents for follow-up. We have been using silver alginate to the tunneled area. She completed her course of antibiotics. She reports no pain, increased swelling or erythema. 9/21; patient presents for follow-up. She has been using gentamicin to the tunneled area. She has no issues or complaints today. She denies signs of infection. 10/2; patient presents for follow-up. [name] scheduled to have her CT scan on 10/9. She currently denies signs of infection. She denies increased warmth, erythema or purulent drainage from the wound bed. She has been using Dakin's wet-to-dry dressings. 10/12; patient presents for follow-up. She had her CT scan on 10/9 that showed A small irregular rim-enhancing fluid pocket communicating to the overlying soft tissues of the sinus tract compatible with a small abscess. Currently she denies systemic signs of infection. She has been doing Dakin's wet-to-dry packing strips but it [name] hard for her to pack into the narrow opening. 10/30; patient presents for follow-up. Since last clinic visit she has had OR debridement of her Right foot by Dr. Odis Hollingshead Due to abscess noted on CT. She has been using iodoform packing. [name] on Augmentin per infectious disease Due to culture growth of  actinomyces. She will complete 2 weeks of this and continue with oral amoxicillin for the next 6 to 12 months. She follows with Dr. Luciana Axe for this.  She currently denies signs of infection. 11/13; patient presents for follow-up. Patient has been using silver alginate with gentamicin to the wound bed. She has no issues or complaints today she. She reports improvement in wound healing. 12/4; patient presents for follow-up. She has been using silver alginate and gentamicin to the wound bed. She has no issues or complaints today. 12/8; patient presents for follow-up. She has been using silver alginate with gentamicin to the wound bed. She presents for her first cast placement. She will be back early next week for her obligatory cast change. 12/12; patient presents for follow-up. We have been using collagen with antibiotic ointment to the wound bed under a total contact cast. She has tolerated this well. [name] improved in wound healing. 12/18; patient presents for follow-up. We have been using collagen with antibiotic ointment under the total contact cast. She has no complaints today. 1/2; The cast was placed at last clinic visit however patient missed her follow-up and this was taken off last week at a nurse visit. Patient has been using packing strips to the medial narrowed wound. She has noted no drainage. 1/22; patient presents for follow-up. She has been keeping the area covered. She reports no drainage. She has had no issues to the previous wound site. She denies any signs of infection including increased warmth, erythema or purulent drainage. 08/08/2022 [name] a 42 year old female with a past medical history of insulin-dependent uncontrolled type 2 diabetes with last hemoglobin A1c of 8.8, chronic osteomyelitis of the right foot status post transmetatarsal amputation on 12/18/2020 that has been previously treated for surgical dehiscence of this site in our clinic. She presents today 6 months after discharge from our clinic due to healed right foot wound with now reopening of the previous surgical site. She reports chronic pain to  the site however denies purulent drainage, increased warmth or erythema. She noticed the wound opening about 1 month ago. She has been doing physical therapy without significant issues. She has inserts to her shoe however she has been advised to follow-up with [employer] for new custom inserts by her orthopedic surgeon now that she has a new wound. She states she will try and do this. She has been using antibiotic ointment to the wound bed. 8/1; patient presents for follow-up. She has been taking her antibiotics without issues. She has been using Dakin's wet-to-dry dressings to the wound bed. The wound [name] smaller. She denies systemic signs of infection. 8/13; patient presents for follow-up. She had ABIs completed that were normal. On the right it was 1.02 and on the left was 0.99. She has been using silver alginate to the wound bed with gentamicin ointment. She states it [name] hard to pack the wound bed. She currently denies signs of infection. 8/29; patient presents for follow-up. She has been using collagen with antibiotic ointment to the wound bed. Wound [name] stable. She denies signs of infection. 9/5; patient presents for follow-up. She has been using silver alginate to the wound bed. Wound [name] slightly wider. She denies signs of infection. 9/12; patient presents for follow-up. She has been using silver alginate to the wound bed. Wound [name] slightly smaller today. 9/19; right foot in the setting of a TMA over the first metatarsal head. She has thick callus around this area small fissured wound which actually  looks like it [name] attempting to close. She has a offloading shoe I am not sure how much activity [name] in. I did not have a lot of time to see her today because of transportation 9/26; right foot in the setting of a previous TMA. The wound [name] over the plantar first metatarsal head. This [name] a fissured wound with thick callus around the margins. It [name] small in terms of overall surface area but has a depth  of about 0.6 cm there was no palpable bone. She has a history of chronic osteomyelitis although she has been treated for this I have reviewed CT scans from 2023 also an MRI from 10/26/2021. Neither imaging study showed osteomyelitis in this area although the CT scan suggested osteomyelitis in the fourth metatarsal head. There has been problems with abscesses communicating with the wound although there does not appear to be purulent drainage currently 10/3; right foot at the TMA site. Small probing wound. I changed her to Iodosorb with gauze last time. May be some improvement. She has a depth of about 0.6 cm but there [name] no palpable bone. She has been previously treated for chronic osteomyelitis in this area. 10/17; this [name] a patient with a probing wound at the right TMA site. Underlying osteomyelitis previously treated earlier this year. I been using Iodosorb to try and help constrict this wound however today she comes in with a larger circumference a larger tunnel and this probably sits right on top of bone. Also worrisome [name] there was a streaking dark area just caudal to the wound on the dorsal surface of the foot. The patient [name] complaining of more pain but no fever. 10/24; culture of this area [bone scraping] revealed Acinetobacter and 2 coag negative staph. I am going to give her Septra DS 1 p.o. twice daily for 2 weeks. I think it would be simplistic to call this a bone culture but it certainly was a deep culture. An x-ray was done but that has not been reported yet. I had wanted to get lab work including a CBC with differential ESR and C-reactive protein but I am not actually sure that got done last week. We have been using gent and silver alginate. 11/7; patient [name] still taking trimethoprim/sulfamethoxazole for the assessment of bacterial identified last week. Lab work revealed a C-reactive protein of 4.1 which [name] elevated from 1.7 but not dramatic. White count was normal at 8.4 x-ray of the  right foot revealed minimal new lucency and cortical erosion within the adjacent distal plantar aspect of the metatarsals concerning for acute osteomyelitis early. Electronic Signature(s) Signed: 11/21/2022 4:34:18 PM By: [name] Najjar MD Entered By: [name] on 11/21/2022 13:13:15 Welter, [name] ([id number]) [id number] Page 4 of 10 -------------------------------------------------------------------------------- Physical Exam Details Patient Name: Date of Service: [name] V [name], [name]. 11/21/2022 2:00 PM Medical Record Number: [id number] Patient Account Number: [id number] Date of Birth/Sex: Treating RN: 1980/03/02 (42 y.o. F) Primary Care Provider: Gwinda Passe Other Clinician: Referring Provider: Treating Provider/Extender: Aurelio Brash in Treatment: 15 Notes .Marland KitchenWound exam; area over the plantar first metatarsal head. Significant undermining. There [name] no palpable bone but the area [name] deep. What I can see of the granulation looks satisfactory. Electronic Signature(s) Signed: 11/21/2022 4:34:18 PM By: [name] Najjar MD Entered By: [name] on 11/21/2022 13:15:10 -------------------------------------------------------------------------------- Physician Orders Details Patient Name: Date of Service: [name] V [name], [name]. 11/21/2022 2:00 PM Medical Record Number: [id number] Patient Account Number: [id number] Date of  Birth/Sex: Treating RN: April 11, 1980 (42 y.o. Katrinka Blazing Primary Care Provider: Gwinda Passe Other Clinician: Referring Provider: Treating Provider/Extender: Aurelio Brash in Treatment: 15 Verbal / Phone Orders: No Diagnosis Coding ICD-10 Coding Code Description [phone] Non-pressure chronic ulcer of other part of right foot with fat layer exposed E11.621 Type 2 diabetes mellitus with foot ulcer M86.671 Other chronic osteomyelitis, right ankle and foot Follow-up  Appointments ppointment in 1 week. - Dr. Leanord Hawking Return A +++ MRI RIGHT FOOT++++ ppointment in 2 weeks. - Dr. Leanord Hawking Return A Other: - Pick up oral antibiotics from pharmacy. Get lab work done at any [employer] [facility] (In clinic) Topical Lidocaine 4% applied to wound bed Cellular or Tissue Based Products Wound #4 Right Amputation Site - Transmetatarsal Cellular or Tissue Based Product Type: - RUN IVR for APLIGRAF (09/12/22)-SENT NOTES Bathing/ Shower/ Hygiene May shower with protection but do not get wound dressing(s) wet. Protect dressing(s) with water repellant cover (for example, large plastic bag) or a cast cover and may then take shower. Other Bathing/Shower/Hygiene Orders/Instructions: - When changing the dressing (every other day) you may get right foot wet. On days NOT changing the dressing.Keep wound/dressing dry Off-Loading Open toe surgical shoe to: Wound Treatment [name], [name] ([id number]) [id number] Page 5 of 10 Wound #4 - Amputation Site - Transmetatarsal Wound Laterality: Right Cleanser: Soap and Water Every Other Day/30 Days Discharge Instructions: May shower and wash wound with dial antibacterial soap and water prior to dressing change. On days not changing the dressing, please keep wound/dressing dry Topical: Gentamicin Every Other Day/30 Days Discharge Instructions: As directed by physician Prim Dressing: Maxorb Extra Ag+ Alginate Dressing, 4x4.75 (in/in) Every Other Day/30 Days ary Discharge Instructions: Apply to wound bed as instructed Secondary Dressing: ABD Pad, 8x10 Every Other Day/30 Days Discharge Instructions: Apply over primary dressing as directed. Secured With: [employer], 4.5x3.1 (in/yd) Every Other Day/30 Days Discharge Instructions: Secure with Kerlix as directed. Secured With: 45M Medipore H Soft Cloth Surgical T ape, 4 x 10 (in/yd) Every Other Day/30 Days Discharge Instructions: Secure with tape  as directed. Secured With: ace wrap Every Other Day/30 Days Discharge Instructions: apply lightly to hold dressing in place. Radiology MRI, Right foot with contrast - MRI Right foot with and without contrast to rule out osteomyelitis - (ICD10 E11.621 - Type 2 diabetes mellitus with foot ulcer) Electronic Signature(s) Signed: 11/28/2022 1:30:39 PM By: Shawn Stall RN, BSN Signed: 11/28/2022 4:06:32 PM By: [name] Najjar MD Previous Signature: 11/27/2022 5:11:37 PM Version By: Karie Schwalbe RN Previous Signature: 11/21/2022 6:28:11 PM Version By: Karie Schwalbe RN Previous Signature: 11/25/2022 4:37:17 PM Version By: [name] Najjar MD Previous Signature: 11/21/2022 4:34:18 PM Version By: [name] Najjar MD Entered By: Shawn Stall on 11/28/2022 10:30:39 Prescription 11/21/2022 -------------------------------------------------------------------------------- Dusseau, [name]. [name] Najjar MD Patient Name: Provider: Feb 22, 1980 [id number] Date of Birth: NPI#: [id number] Sex: DEA #: [phone] [id number] Phone #: License #: [id number] UPN: Patient Address: [address] Eligha Bridegroom [facility] [place] [address] , [address] [place], [place] [address] [phone] Allergies No Known Drug Allergies Provider's Orders MRI, Right foot with contrast - ICD10: E11.621 - MRI Right foot with and without contrast to rule out osteomyelitis Hand Signature: Date(s): Electronic Signature(s) Signed: 11/28/2022 4:06:32 PM By: [name] Najjar MD Signed: 11/29/2022 1:18:47 PM By: Shawn Stall RN, BSN Previous Signature: 11/27/2022 5:11:37 PM Version By: Karie Schwalbe RN Entered By: Shawn Stall on 11/28/2022 10:30:39  [name], [name] ([id number]) [id number] Page 6 of 10 -------------------------------------------------------------------------------- Problem List Details Patient Name: Date of Service: [name], [name] RKIA  [name]. 11/21/2022 2:00 PM Medical Record Number: [id number] Patient Account Number: [id number] Date of Birth/Sex: Treating RN: 22-May-1980 (42 y.o. F) Primary Care Provider: Gwinda Passe Other Clinician: Referring Provider: Treating Provider/Extender: Aurelio Brash in Treatment: 15 Active Problems ICD-10 Encounter Code Description Active Date MDM Diagnosis [phone] Non-pressure chronic ulcer of other part of right foot with fat layer exposed 08/08/2022 No Yes E11.621 Type 2 diabetes mellitus with foot ulcer 08/08/2022 No Yes M86.671 Other chronic osteomyelitis, right ankle and foot 08/08/2022 No Yes Inactive Problems Resolved Problems Electronic Signature(s) Signed: 11/21/2022 4:34:18 PM By: [name] Najjar MD Entered By: [name] on 11/21/2022 13:10:39 -------------------------------------------------------------------------------- Progress Note Details Patient Name: Date of Service: [name] V [name], [name]. 11/21/2022 2:00 PM Medical Record Number: [id number] Patient Account Number: [id number] Date of Birth/Sex: Treating RN: 09-04-1980 (42 y.o. F) Primary Care Provider: Gwinda Passe Other Clinician: Referring Provider: Treating Provider/Extender: Aurelio Brash in Treatment: 15 Subjective History of Present Illness (HPI) Admission 01/22/2021 Ms. Lousie Rosato [name] a 42 year old female with a past medical history of insulin-dependent uncontrolled type 2 diabetes with last hemoglobin A1c of 13.5, osteomyelitis of the right foot status post transmetatarsal amputation on 12/18/2020 that presents to the clinic for right foot wound. She has had dehiscence of the surgical site. [name] using wet-to-dry dressings. She has a PICC line and receiving IV ceftriaxone daily for her osteomyelitis. There [name] an end date of 01/27/2021. [name] also taking oral metronidazole. She currently denies systemic signs of infection. 1/19; patient  presents for follow-up. She was diagnosed with a DVT to the right lower extremity 2 days ago. [name] on Eliquis now. [name] scheduled to see her infectious disease doctor tomorrow. She has been using Dakin's wet-to-dry dressings. She denies systemic signs of infection. 1/26; patient presents for follow-up. She saw infectious disease on 1/21 started on Augmentin. Her PICC line and IV ceftriaxone was discontinued. Patient reports stability to her wound. She has been using Dakin's wet-to-dry dressings. She currently denies systemic signs of infection. 2/3; patient presents for follow-up. She continues to use Dakin's wet-to-dry dressings to the wound bed. She saw Dr. Manson Passey with infectious disease [name], [name] ([id number]) [id number] Page 7 of 10 yesterday and [name] continuing Augmentin. T entative end date [name] 2/16. Patient reports following up with orthopedics. She states there [name] no further plan from them. She currently denies systemic signs of infection. 2/10; patient presents for follow-up. She continues to use Dakin's wet to dry dressings. [name] scheduled to have her MRI done on 2/14. She states that she had pain to the debridement site from last clinic visit and declines debridement today. She denies systemic signs of infection. She continues to have yellow thick drainage. 2/20; patient presents for follow-up. She continues to use Dakin's wet-to-dry dressings. She obtained her MRI. The results showed an abscess and [name] scheduled to see her orthopedic surgeon on 2/23. She saw infectious disease 2/17 and her antibiotics were extended. She currently denies systemic signs of infection. 3/6; patient presents for follow-up. She had debridement and irrigation of her foot on 03/10/2021 due to abscess noted on MRI. She was started on IV ceftriaxone and oral Flagyl. She has no issues or complaints today. She has been using iodoform packing to the tunnel and Dakin's wet-to-dry  to the opening. 03/26/2021:  She continues on IV ceftriaxone and oral metronidazole. She has follow-up with infectious disease tomorrow. No significant issues or complaints today. Her mother continues to help her with her wound dressing, using iodoform packing strips into the tunnel and Dakin's to the open portion of the wound. 3/20; patient presents for follow-up. She continues to be on IV ceftriaxone in oral metronidazole. She has been using iodoform to the tunnel and Dakin's wet-to- dry to the open wound. She denies signs of infection. 3/27; patient presents for follow-up. She no longer has a PICC line. She has been using iodoform to the tunnel and Dakin's wet-to-dry to the open wound. She reports improvement in wound healing. She denies signs of infection. 4/3; patient presents for follow-up. She states she has been using Hydrofera Blue to the open wound and iodoform packing to the tunnel without any issues. She denies signs of infection. 4/18; patient presents for follow-up. She saw infectious disease on 4/11. She has finished her oral antibiotics and completed a total of 6 weeks of antibiotics (this includes IV as well). No further antibiotics needed. She has been using Hydrofera Blue and iodoform packing. She states that the tunneled area has come in and the iodoform [name] not staying in place anymore. She has no issues or complaints today. She denies signs of infection. 4/24; patient presents for follow-up. She saw Dr. Carlene Coria, plastic surgery to discuss potential skin graft/substitute placement. At this time he thinks that the skin graft would likely not take. He [name] in agreement with trying a wound VAC. Patient has been using Hydrofera Blue dressing changes with no issues. She denies signs of infection. She reports improvement in wound healing. 5/1; patient presents for follow-up. Unfortunately patient did not have insurance when we ran for the pico. There [name] an assistance program and we are  trying to get this accommodated for the patient. In the meantime she has been using Hydrofera Blue without any issues. She denies signs of infection. 5/8; patient presents for follow-up. We have not heard back if pico [name] covered by her insurance. She has been using collagen to the wound bed over the past week. She denies signs of infection. 5/18; patient presents for follow-up. She has been using collagen to the wound bed without issues. Again we have not heard if pico [name] covered by her insurance. She has no issues or complaints today. 5/23; patient presents for follow-up. She has been using collagen to the wound bed. She has no issues or complaints today. She obtained the wound VAC from [facility] and brought this in today. She denies signs of infection. 6/1; patient presents for follow-up. She has been using the wound VAC for the past week. She has had this changed twice since she was last here. She reports more maceration to the periwound. She denies signs of infection. 6/7; right TMA site. There are 2 wounds 0 separated by a bridge of healed tissue. The more lateral area has undermining. Both areas have healthy looking granulation at the base but relative the size of the wound [name] fairly deep. There [name] no exposed bone no evidence of infection. Her wound VAC was put on hold last week because of surrounding skin maceration she has been using collagen this week. She has a modified shoe 6/12; patient presents for follow-up. Last week the wound VAC was reinitiated. She had no issues with the wound VAC itself. Today she has maceration again noted to the surrounding skin. She denies signs of infection. 6/27; patient presents  for follow-up. She has been using Medihoney to the wound bed. We took a break from the wound VAC because the periwound was macerated. She still has some areas of maceration to the distal foot where there [name] a callus. She currently denies signs of infection. 7/11; patient presents for  follow-up. She has been using Medihoney to the wound bed. She has developed some increased warmth and erythema to the lateral aspect of the right foot. She states this [name] occurred over the past week and there [name] increased pain. No drainage noted. 7/17; patient presents for follow-up. She has been using Medihoney to the wound bed. She completed her course of Bactrim. She reports improvement in symptoms. 7/24; Patient presents for follow up. She has been using Medihoney to the wound bed without issues. She completed another course of Bactrim. She reports improvement in her symptoms but still has some mild tenderness to the medial aspect of the foot. 7/31; Patient presents for follow-up. She has been using Medihoney and Dakin's to the wound bed. She denies signs of infection. 8/14; patient presents for follow-up. She has been using Dakin's wet-to-dry packing strips to the right medial aspect of the amputation site and Medihoney to the anterior site. She has no issues or complaints today. She has started physical therapy. She denies signs of infection. 8/29; patient presents for follow-up. She has been using Dakin's wet-to-dry packing strips to the right medial aspect of the amputation site however this [name] becoming more difficult to place. She did report that she had increased redness and swelling to that site and developed some drainage. It has resolved. She continues with physical therapy. 9/8; patient presents for follow-up. We have been using silver alginate to the tunneled area. She completed her course of antibiotics. She reports no pain, increased swelling or erythema. 9/21; patient presents for follow-up. She has been using gentamicin to the tunneled area. She has no issues or complaints today. She denies signs of infection. 10/2; patient presents for follow-up. [name] scheduled to have her CT scan on 10/9. She currently denies signs of infection. She denies increased warmth, erythema or purulent  drainage from the wound bed. She has been using Dakin's wet-to-dry dressings. 10/12; patient presents for follow-up. She had her CT scan on 10/9 that showed A small irregular rim-enhancing fluid pocket communicating to the overlying soft tissues of the sinus tract compatible with a small abscess. Currently she denies systemic signs of infection. She has been doing Dakin's wet-to-dry packing strips but it [name] hard for her to pack into the narrow opening. 10/30; patient presents for follow-up. Since last clinic visit she has had OR debridement of her Right foot by Dr. Odis Hollingshead Due to abscess noted on CT. She has been using iodoform packing. 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 She will complete 2 weeks of this and [name], [name] ([id number]) [id number] Page 8 of 10 continue with oral amoxicillin for the next 6 to 12 months. She follows with Dr. Luciana Axe for this. She currently denies signs of infection. 11/13; patient presents for follow-up. Patient has been using silver alginate with gentamicin to the wound bed. She has no issues or complaints today she. She reports improvement in wound healing. 12/4; patient presents for follow-up. She has been using silver alginate and gentamicin to the wound bed. She has no issues or complaints today. 12/8; patient presents for follow-up. She has been using silver alginate with gentamicin to the wound bed. She  presents for her first cast placement. She will be back early next week for her obligatory cast change. 12/12; patient presents for follow-up. We have been using collagen with antibiotic ointment to the wound bed under a total contact cast. She has tolerated this well. [name] improved in wound healing. 12/18; patient presents for follow-up. We have been using collagen with antibiotic ointment under the total contact cast. She has no complaints today. 1/2; The cast was placed at last clinic  visit however patient missed her follow-up and this was taken off last week at a nurse visit. Patient has been using packing strips to the medial narrowed wound. She has noted no drainage. 1/22; patient presents for follow-up. She has been keeping the area covered. She reports no drainage. She has had no issues to the previous wound site. She denies any signs of infection including increased warmth, erythema or purulent drainage. 08/08/2022 [name] a 42 year old female with a past medical history of insulin-dependent uncontrolled type 2 diabetes with last hemoglobin A1c of 8.8, chronic osteomyelitis of the right foot status post transmetatarsal amputation on 12/18/2020 that has been previously treated for surgical dehiscence of this site in our clinic. She presents today 6 months after discharge from our clinic due to healed right foot wound with now reopening of the previous surgical site. She reports chronic pain to the site however denies purulent drainage, increased warmth or erythema. She noticed the wound opening about 1 month ago. She has been doing physical therapy without significant issues. She has inserts to her shoe however she has been advised to follow-up with [employer] for new custom inserts by her orthopedic surgeon now that she has a new wound. She states she will try and do this. She has been using antibiotic ointment to the wound bed. 8/1; patient presents for follow-up. She has been taking her antibiotics without issues. She has been using Dakin's wet-to-dry dressings to the wound bed. The wound [name] smaller. She denies systemic signs of infection. 8/13; patient presents for follow-up. She had ABIs completed that were normal. On the right it was 1.02 and on the left was 0.99. She has been using silver alginate to the wound bed with gentamicin ointment. She states it [name] hard to pack the wound bed. She currently denies signs of infection. 8/29; patient presents for follow-up.  She has been using collagen with antibiotic ointment to the wound bed. Wound [name] stable. She denies signs of infection. 9/5; patient presents for follow-up. She has been using silver alginate to the wound bed. Wound [name] slightly wider. She denies signs of infection. 9/12; patient presents for follow-up. She has been using silver alginate to the wound bed. Wound [name] slightly smaller today. 9/19; right foot in the setting of a TMA over the first metatarsal head. She has thick callus around this area small fissured wound which actually looks like it [name] attempting to close. She has a offloading shoe I am not sure how much activity [name] in. I did not have a lot of time to see her today because of transportation 9/26; right foot in the setting of a previous TMA. The wound [name] over the plantar first metatarsal head. This [name] a fissured wound with thick callus around the margins. It [name] small in terms of overall surface area but has a depth of about 0.6 cm there was no palpable bone. She has a history of chronic osteomyelitis although she has been treated for this I have reviewed  CT scans from 2023 also an MRI from 10/26/2021. Neither imaging study showed osteomyelitis in this area although the CT scan suggested osteomyelitis in the fourth metatarsal head. There has been problems with abscesses communicating with the wound although there does not appear to be purulent drainage currently 10/3; right foot at the TMA site. Small probing wound. I changed her to Iodosorb with gauze last time. May be some improvement. She has a depth of about 0.6 cm but there [name] no palpable bone. She has been previously treated for chronic osteomyelitis in this area. 10/17; this [name] a patient with a probing wound at the right TMA site. Underlying osteomyelitis previously treated earlier this year. I been using Iodosorb to try and help constrict this wound however today she comes in with a larger circumference a larger tunnel and this  probably sits right on top of bone. Also worrisome [name] there was a streaking dark area just caudal to the wound on the dorsal surface of the foot. The patient [name] complaining of more pain but no fever. 10/24; culture of this area [bone scraping] revealed Acinetobacter and 2 coag negative staph. I am going to give her Septra DS 1 p.o. twice daily for 2 weeks. I think it would be simplistic to call this a bone culture but it certainly was a deep culture. An x-ray was done but that has not been reported yet. I had wanted to get lab work including a CBC with differential ESR and C-reactive protein but I am not actually sure that got done last week. We have been using gent and silver alginate. 11/7; patient [name] still taking trimethoprim/sulfamethoxazole for the assessment of bacterial identified last week. Lab work revealed a C-reactive protein of 4.1 which [name] elevated from 1.7 but not dramatic. White count was normal at 8.4 x-ray of the right foot revealed minimal new lucency and cortical erosion within the adjacent distal plantar aspect of the metatarsals concerning for acute osteomyelitis early. Objective Constitutional Vitals Time Taken: 2:04 PM, Height: 69 in, Weight: 330 lbs, BMI: 48.7, Temperature: 98.6 F, Pulse: 92 bpm, Respiratory Rate: 20 breaths/min, Blood Pressure: 124/82 mmHg. Integumentary (Hair, Skin) Wound #4 status [name] Open. Original cause of wound was Shear/Friction. The date acquired was: 07/08/2022. The wound has been in treatment 15 weeks. The wound [name] located on the Right Amputation Site - Transmetatarsal. The wound measures 1cm length x 0.5cm width x 1.4cm depth; 0.393cm^2 area and 0.55cm^3 volume. There [name] Fat Layer (Subcutaneous Tissue) exposed. There [name] no tunneling noted, however, there [name] undermining starting at 1:00 and ending at 6:00 with a maximum distance of 1cm. There [name] a medium amount of serosanguineous drainage noted. The wound margin [name] distinct with the outline attached  to the wound base. There [name] large (67-100%) pink, pale granulation within the wound bed. There [name] a small (1-33%) amount of necrotic tissue within the wound bed [name], [name] ([id number]) [id number] Page 9 of 10 including Adherent Slough. The periwound skin appearance exhibited: Callus, Maceration. The periwound skin appearance did not exhibit: Crepitus, Excoriation, Induration, Rash, Scarring, Dry/Scaly, Atrophie Blanche, Cyanosis, Ecchymosis, Hemosiderin Staining, Mottled, Pallor, Rubor, Erythema. The periwound has tenderness on palpation. Assessment Active Problems ICD-10 Non-pressure chronic ulcer of other part of right foot with fat layer exposed Type 2 diabetes mellitus with foot ulcer Other chronic osteomyelitis, right ankle and foot 1. Topical gentamicin and Prisma 2. Given the findings on plain x-ray the elevated C-reactive protein albeit moderately I think she needs an MRI of  her foot with contrast. We have ordered this 3. Depending on the MRI we may have to look at a total contact cast. Would also wonder about hyperbarics. She was treated for osteomyelitis earlier this year however. Procedures Wound #4 Pre-procedure diagnosis of Wound #4 [name] a Diabetic Wound/Ulcer of the Lower Extremity located on the Right Amputation Site - Transmetatarsal .Severity of Tissue Pre Debridement [name]: Fat layer exposed. There was a Excisional Skin/Subcutaneous Tissue Debridement with a total area of 0.39 sq cm performed by Maxwell Caul., MD. With the following instrument(s): Curette to remove Viable and Non-Viable tissue/material. Material removed includes Subcutaneous Tissue, Skin: Dermis, and Skin: Epidermis after achieving pain control using Lidocaine 4% Topical Solution. No specimens were taken. A time out was conducted at 14:22, prior to the start of the procedure. A Minimum amount of bleeding was controlled with Pressure. The procedure was tolerated well.  Post Debridement Measurements: 1cm length x 0.5cm width x 1.4cm depth; 0.55cm^3 volume. Character of Wound/Ulcer Post Debridement [name] improved. Severity of Tissue Post Debridement [name]: Fat layer exposed. Post procedure Diagnosis Wound #4: Same as Pre-Procedure Plan Follow-up Appointments: Return Appointment in 1 week. - Dr. Leanord Hawking +++ MRI RIGHT FOOT++++ Return Appointment in 2 weeks. - Dr. Leanord Hawking Other: - Pick up oral antibiotics from pharmacy. Get lab work done at any [employer] facility-Done Anesthetic: (In clinic) Topical Lidocaine 4% applied to wound bed Cellular or Tissue Based Products: Wound #4 Right Amputation Site - Transmetatarsal: Cellular or Tissue Based Product Type: - RUN IVR for APLIGRAF (09/12/22)-SENT NOTES Bathing/ Shower/ Hygiene: May shower with protection but do not get wound dressing(s) wet. Protect dressing(s) with water repellant cover (for example, large plastic bag) or a cast cover and may then take shower. Other Bathing/Shower/Hygiene Orders/Instructions: - When changing the dressing (every other day) you may get right foot wet. On days NOT changing the dressing.Keep wound/dressing dry Off-Loading: Open toe surgical shoe to: Radiology ordered were: MRI, Right foot with contrast - MRI Right foot with and without contrast to rule out osteomyelitis WOUND #4: - Amputation Site - Transmetatarsal Wound Laterality: Right Cleanser: Soap and Water Every Other Day/30 Days Discharge Instructions: May shower and wash wound with dial antibacterial soap and water prior to dressing change. On days not changing the dressing, please keep wound/dressing dry Topical: Gentamicin Every Other Day/30 Days Discharge Instructions: As directed by physician Prim Dressing: Maxorb Extra Ag+ Alginate Dressing, 4x4.75 (in/in) Every Other Day/30 Days ary Discharge Instructions: Apply to wound bed as instructed Secondary Dressing: ABD Pad, 8x10 Every Other Day/30 Days Discharge Instructions: Apply  over primary dressing as directed. Secured With: [employer], 4.5x3.1 (in/yd) Every Other Day/30 Days Discharge Instructions: Secure with Kerlix as directed. Secured With: 21M Medipore H Soft Cloth Surgical T ape, 4 x 10 (in/yd) Every Other Day/30 Days Discharge Instructions: Secure with tape as directed. Secured With: ace wrap Every Other Day/30 Days Discharge Instructions: apply lightly to hold dressing in place. Electronic Signature(s) Signed: 11/28/2022 4:06:32 PM By: [name] Najjar MD Signed: 11/29/2022 1:18:47 PM By: Shawn Stall RN, BSN [facility] L11/15/2024 1:18:47 PM By: Shawn Stall RN, BSN Signed: ([id number]) [id number] Page 10 of 10 Previous Signature: 11/22/2022 11:35:07 AM Version By: Shawn Stall RN, BSN Previous Signature: 11/25/2022 4:37:17 PM Version By: [name] Najjar MD Previous Signature: 11/21/2022 4:34:18 PM Version By: [name] Najjar MD Entered By: Shawn Stall on 11/28/2022 10:30:49 -------------------------------------------------------------------------------- SuperBill Details Patient Name: Date of Service: [name] V [name], [name]. 11/21/2022 Medical Record Number: [id number]  Patient Account Number: [id number] Date of Birth/Sex: Treating RN: [birth year] (42 y.o. F) Primary Care Provider: Gwinda Passe Other Clinician: Referring Provider: Treating Provider/Extender: Aurelio Brash in Treatment: 15 Diagnosis Coding ICD-10 Codes Code Description [phone] Non-pressure chronic ulcer of other part of right foot with fat layer exposed E11.621 Type 2 diabetes mellitus with foot ulcer M86.671 Other chronic osteomyelitis, right ankle and foot Facility Procedures : CPT4 Code: [id number] Description: 11042 - DEB SUBQ TISSUE 20 SQ CM/< ICD-10 Diagnosis Description L97.512 Non-pressure chronic ulcer of other part of right foot with fat layer exposed E11.621 Type 2 diabetes mellitus with foot  ulcer Modifier: Quantity: 1 Physician Procedures : CPT4 Code Description Modifier [id number] 11042 - WC PHYS SUBQ TISS 20 SQ CM ICD-10 Diagnosis Description L97.512 Non-pressure chronic ulcer of other part of right foot with fat layer exposed E11.621 Type 2 diabetes mellitus with foot ulcer Quantity: 1 Electronic Signature(s) Signed: 11/21/2022 4:34:18 PM By: [name] Najjar MD Entered By: [name] on 11/21/2022 13:16:55

## 2022-11-28 ENCOUNTER — Ambulatory Visit (HOSPITAL_BASED_OUTPATIENT_CLINIC_OR_DEPARTMENT_OTHER): Payer: Medicaid Other | Admitting: Internal Medicine

## 2022-11-28 ENCOUNTER — Other Ambulatory Visit (HOSPITAL_COMMUNITY): Payer: Self-pay | Admitting: Internal Medicine

## 2022-11-28 DIAGNOSIS — E11621 Type 2 diabetes mellitus with foot ulcer: Secondary | ICD-10-CM

## 2022-12-03 ENCOUNTER — Other Ambulatory Visit: Payer: Self-pay

## 2022-12-03 ENCOUNTER — Encounter (HOSPITAL_BASED_OUTPATIENT_CLINIC_OR_DEPARTMENT_OTHER): Payer: Medicaid Other | Admitting: Internal Medicine

## 2022-12-03 DIAGNOSIS — E11621 Type 2 diabetes mellitus with foot ulcer: Secondary | ICD-10-CM | POA: Diagnosis not present

## 2022-12-03 MED ORDER — SULFAMETHOXAZOLE-TRIMETHOPRIM 800-160 MG PO TABS
1.0000 | ORAL_TABLET | Freq: Two times a day (BID) | ORAL | 0 refills | Status: AC
  Filled 2022-12-03: qty 28, 14d supply, fill #0

## 2022-12-03 NOTE — Progress Notes (Signed)
MADALINE, MULATO ([id number]) [id number] Page 1 of 9 Visit Report for 12/03/2022 HPI Details Patient Name: Date of Service: [name] [name], [name]. 12/03/2022 12:30 PM Medical Record Number: [id number] Patient Account Number: [id number] Date of Birth/Sex: Treating RN: June 30, 1980 (42 y.o. F) Primary Care Provider: Gwinda Passe Other Clinician: Referring Provider: Treating Provider/Extender: Aurelio Brash in Treatment: 16 History of Present Illness HPI Description: Admission 01/22/2021 [name] a 42 year old female with a past medical history of insulin-dependent uncontrolled type 2 diabetes with last hemoglobin A1c of 13.5, osteomyelitis of the right foot status post transmetatarsal amputation on 12/18/2020 that presents to the clinic for right foot wound. She has had dehiscence of the surgical site. [name] using wet-to-dry dressings. She has a PICC line and receiving IV ceftriaxone daily for her osteomyelitis. There [name] an end date of 01/27/2021. [name] also taking oral metronidazole. She currently denies systemic signs of infection. 1/19; patient presents for follow-up. She was diagnosed with a DVT to the right lower extremity 2 days ago. [name] on Eliquis now. [name] scheduled to see her infectious disease doctor tomorrow. She has been using Dakin's wet-to-dry dressings. She denies systemic signs of infection. 1/26; patient presents for follow-up. She saw infectious disease on 1/21 started on Augmentin. Her PICC line and IV ceftriaxone was discontinued. Patient reports stability to her wound. She has been using Dakin's wet-to-dry dressings. She currently denies systemic signs of infection. 2/3; patient presents for follow-up. She continues to use Dakin's wet-to-dry dressings to the wound bed. She saw Dr. Manson Passey with infectious disease yesterday and [name] continuing Augmentin. T entative end date [name] 2/16. Patient reports  following up with orthopedics. She states there [name] no further plan from them. She currently denies systemic signs of infection. 2/10; patient presents for follow-up. She continues to use Dakin's wet to dry dressings. [name] scheduled to have her MRI done on 2/14. She states that she had pain to the debridement site from last clinic visit and declines debridement today. She denies systemic signs of infection. She continues to have yellow thick drainage. 2/20; patient presents for follow-up. She continues to use Dakin's wet-to-dry dressings. She obtained her MRI. The results showed an abscess and [name] scheduled to see her orthopedic surgeon on 2/23. She saw infectious disease 2/17 and her antibiotics were extended. She currently denies systemic signs of infection. 3/6; patient presents for follow-up. She had debridement and irrigation of her foot on 03/10/2021 due to abscess noted on MRI. She was started on IV ceftriaxone and oral Flagyl. She has no issues or complaints today. She has been using iodoform packing to the tunnel and Dakin's wet-to-dry to the opening. 03/26/2021: She continues on IV ceftriaxone and oral metronidazole. She has follow-up with infectious disease tomorrow. No significant issues or complaints today. Her mother continues to help her with her wound dressing, using iodoform packing strips into the tunnel and Dakin's to the open portion of the wound. 3/20; patient presents for follow-up. She continues to be on IV ceftriaxone in oral metronidazole. She has been using iodoform to the tunnel and Dakin's wet-to- dry to the open wound. She denies signs of infection. 3/27; patient presents for follow-up. She no longer has a PICC line. She has been using iodoform to the tunnel and Dakin's wet-to-dry to the open wound. She reports improvement in wound healing. She denies signs of infection. 4/3; patient presents for follow-up. She states she has been using Hydrofera  Blue to the open wound  and iodoform packing to the tunnel without any issues. She denies signs of infection. 4/18; patient presents for follow-up. She saw infectious disease on 4/11. She has finished her oral antibiotics and completed a total of 6 weeks of antibiotics (this includes IV as well). No further antibiotics needed. She has been using Hydrofera Blue and iodoform packing. She states that the tunneled area has come in and the iodoform [name] not staying in place anymore. She has no issues or complaints today. She denies signs of infection. 4/24; patient presents for follow-up. She saw Dr. Carlene Coria, plastic surgery to discuss potential skin graft/substitute placement. At this time he thinks that the skin graft would likely not take. He [name] in agreement with trying a wound VAC. Patient has been using Hydrofera Blue dressing changes with no issues. She denies signs of infection. She reports improvement in wound healing. 5/1; patient presents for follow-up. Unfortunately patient did not have insurance when we ran for the pico. There [name] an assistance program and we are trying to get this accommodated for the patient. In the meantime she has been using Hydrofera Blue without any issues. She denies signs of infection. 5/8; patient presents for follow-up. We have not heard back if pico [name] covered by her insurance. She has been using collagen to the wound bed over the past week. She denies signs of infection. 5/18; patient presents for follow-up. She has been using collagen to the wound bed without issues. Again we have not heard if pico [name] covered by her insurance. She has no issues or complaints today. 5/23; patient presents for follow-up. She has been using collagen to the wound bed. She has no issues or complaints today. She obtained the wound VAC from [facility] and brought this in today. She denies signs of infection. 6/1; patient presents for follow-up. She has been using the wound VAC for the past week. She has had this changed  twice since she was last here. She reports more maceration to the periwound. She denies signs of infection. 6/7; right TMA site. There are 2 wounds 0 separated by a bridge of healed tissue. The more lateral area has undermining. Both areas have healthy looking granulation at the base but relative the size of the wound [name] fairly deep. There [name] no exposed bone no evidence of infection. [name], [name] ([id number]) [id number] Page 2 of 9 Her wound VAC was put on hold last week because of surrounding skin maceration she has been using collagen this week. She has a modified shoe 6/12; patient presents for follow-up. Last week the wound VAC was reinitiated. She had no issues with the wound VAC itself. Today she has maceration again noted to the surrounding skin. She denies signs of infection. 6/27; patient presents for follow-up. She has been using Medihoney to the wound bed. We took a break from the wound VAC because the periwound was macerated. She still has some areas of maceration to the distal foot where there [name] a callus. She currently denies signs of infection. 7/11; patient presents for follow-up. She has been using Medihoney to the wound bed. She has developed some increased warmth and erythema to the lateral aspect of the right foot. She states this [name] occurred over the past week and there [name] increased pain. No drainage noted. 7/17; patient presents for follow-up. She has been using Medihoney to the wound bed. She completed her course of Bactrim. She reports improvement in symptoms. 7/24; Patient presents for follow  up. She has been using Medihoney to the wound bed without issues. She completed another course of Bactrim. She reports improvement in her symptoms but still has some mild tenderness to the medial aspect of the foot. 7/31; Patient presents for follow-up. She has been using Medihoney and Dakin's to the wound bed. She denies signs of infection. 8/14; patient  presents for follow-up. She has been using Dakin's wet-to-dry packing strips to the right medial aspect of the amputation site and Medihoney to the anterior site. She has no issues or complaints today. She has started physical therapy. She denies signs of infection. 8/29; patient presents for follow-up. She has been using Dakin's wet-to-dry packing strips to the right medial aspect of the amputation site however this [name] becoming more difficult to place. She did report that she had increased redness and swelling to that site and developed some drainage. It has resolved. She continues with physical therapy. 9/8; patient presents for follow-up. We have been using silver alginate to the tunneled area. She completed her course of antibiotics. She reports no pain, increased swelling or erythema. 9/21; patient presents for follow-up. She has been using gentamicin to the tunneled area. She has no issues or complaints today. She denies signs of infection. 10/2; patient presents for follow-up. [name] scheduled to have her CT scan on 10/9. She currently denies signs of infection. She denies increased warmth, erythema or purulent drainage from the wound bed. She has been using Dakin's wet-to-dry dressings. 10/12; patient presents for follow-up. She had her CT scan on 10/9 that showed A small irregular rim-enhancing fluid pocket communicating to the overlying soft tissues of the sinus tract compatible with a small abscess. Currently she denies systemic signs of infection. She has been doing Dakin's wet-to-dry packing strips but it [name] hard for her to pack into the narrow opening. 10/30; patient presents for follow-up. Since last clinic visit she has had OR debridement of her Right foot by Dr. Odis Hollingshead Due to abscess noted on CT. She has been using iodoform packing. 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 She will complete 2 weeks of this and continue with oral amoxicillin  for the next 6 to 12 months. She follows with Dr. Luciana Axe for this. She currently denies signs of infection. 11/13; patient presents for follow-up. Patient has been using silver alginate with gentamicin to the wound bed. She has no issues or complaints today she. She reports improvement in wound healing. 12/4; patient presents for follow-up. She has been using silver alginate and gentamicin to the wound bed. She has no issues or complaints today. 12/8; patient presents for follow-up. She has been using silver alginate with gentamicin to the wound bed. She presents for her first cast placement. She will be back early next week for her obligatory cast change. 12/12; patient presents for follow-up. We have been using collagen with antibiotic ointment to the wound bed under a total contact cast. She has tolerated this well. [name] improved in wound healing. 12/18; patient presents for follow-up. We have been using collagen with antibiotic ointment under the total contact cast. She has no complaints today. 1/2; The cast was placed at last clinic visit however patient missed her follow-up and this was taken off last week at a nurse visit. Patient has been using packing strips to the medial narrowed wound. She has noted no drainage. 1/22; patient presents for follow-up. She has been keeping the area covered. She reports no drainage. She  has had no issues to the previous wound site. She denies any signs of infection including increased warmth, erythema or purulent drainage. 08/08/2022 Ms. Ramonita Cherek [name] a 42 year old female with a past medical history of insulin-dependent uncontrolled type 2 diabetes with last hemoglobin A1c of 8.8, chronic osteomyelitis of the right foot status post transmetatarsal amputation on 12/18/2020 that has been previously treated for surgical dehiscence of this site in our clinic. She presents today 6 months after discharge from our clinic due to healed right foot wound with now  reopening of the previous surgical site. She reports chronic pain to the site however denies purulent drainage, increased warmth or erythema. She noticed the wound opening about 1 month ago. She has been doing physical therapy without significant issues. She has inserts to her shoe however she has been advised to follow-up with [employer] for new custom inserts by her orthopedic surgeon now that she has a new wound. She states she will try and do this. She has been using antibiotic ointment to the wound bed. 8/1; patient presents for follow-up. She has been taking her antibiotics without issues. She has been using Dakin's wet-to-dry dressings to the wound bed. The wound [name] smaller. She denies systemic signs of infection. 8/13; patient presents for follow-up. She had ABIs completed that were normal. On the right it was 1.02 and on the left was 0.99. She has been using silver alginate to the wound bed with gentamicin ointment. She states it [name] hard to pack the wound bed. She currently denies signs of infection. 8/29; patient presents for follow-up. She has been using collagen with antibiotic ointment to the wound bed. Wound [name] stable. She denies signs of infection. 9/5; patient presents for follow-up. She has been using silver alginate to the wound bed. Wound [name] slightly wider. She denies signs of infection. 9/12; patient presents for follow-up. She has been using silver alginate to the wound bed. Wound [name] slightly smaller today. 9/19; right foot in the setting of a TMA over the first metatarsal head. She has thick callus around this area small fissured wound which actually looks like it [name] attempting to close. She has a offloading shoe I am not sure how much activity [name] in. I did not have a lot of time to see her today because of transportation 9/26; right foot in the setting of a previous TMA. The wound [name] over the plantar first metatarsal head. This [name] a fissured wound with thick callus around  the margins. It [name] small in terms of overall surface area but has a depth of about 0.6 cm there was no palpable bone. She has a history of chronic osteomyelitis although she has been treated for this I have reviewed CT scans from 2023 also an MRI from 10/26/2021. Neither imaging study showed osteomyelitis in this area although the CT scan suggested osteomyelitis in the fourth metatarsal head. There has been problems with abscesses communicating with the wound although there does not appear to be [name], [name] ([id number]) [phone].pdf Page 3 of 9 purulent drainage currently 10/3; right foot at the TMA site. Small probing wound. I changed her to Iodosorb with gauze last time. May be some improvement. She has a depth of about 0.6 cm but there [name] no palpable bone. She has been previously treated for chronic osteomyelitis in this area. 10/17; this [name] a patient with a probing wound at the right TMA site. Underlying osteomyelitis previously treated earlier this year. I been using Iodosorb to try  and help constrict this wound however today she comes in with a larger circumference a larger tunnel and this probably sits right on top of bone. Also worrisome [name] there was a streaking dark area just caudal to the wound on the dorsal surface of the foot. The patient [name] complaining of more pain but no fever. 10/24; culture of this area [bone scraping] revealed Acinetobacter and 2 coag negative staph. I am going to give her Septra DS 1 p.o. twice daily for 2 weeks. I think it would be simplistic to call this a bone culture but it certainly was a deep culture. An x-ray was done but that has not been reported yet. I had wanted to get lab work including a CBC with differential ESR and C-reactive protein but I am not actually sure that got done last week. We have been using gent and silver alginate. 11/7; patient [name] still taking trimethoprim/sulfamethoxazole for the assessment of bacterial  identified last week. Lab work revealed a C-reactive protein of 4.1 which [name] elevated from 1.7 but not dramatic. White count was normal at 8.4 x-ray of the right foot revealed minimal new lucency and cortical erosion within the adjacent distal plantar aspect of the metatarsals concerning for acute osteomyelitis early. 11/19; the patient has completed her Septra DS which I gave to her for 2 weeks. She has her MRI tomorrow but then we will have the obligatory 5 to 10- day/workday wait to get a report. I am going to resume her Septra DS for a further 2 weeks. Then decide on additional antibiotics, infectious disease etc. Electronic Signature(s) Signed: 12/03/2022 4:19:19 PM By: Baltazar Najjar MD Entered By: Baltazar Najjar on 12/03/2022 10:40:32 -------------------------------------------------------------------------------- Physical Exam Details Patient Name: Date of Service: [name], [name]. 12/03/2022 12:30 PM Medical Record Number: [id number] Patient Account Number: [id number] Date of Birth/Sex: Treating RN: 08/23/1980 (43 y.o. F) Primary Care Provider: Gwinda Passe Other Clinician: Referring Provider: Treating Provider/Extender: Aurelio Brash in Treatment: 16 Constitutional Sitting or standing Blood Pressure [name] within target range for patient.. Pulse regular and within target range for patient.Marland Kitchen Respirations regular, non-labored and within target range.. Temperature [name] normal and within the target range for the patient.Marland Kitchen Appears in no distress. Notes Wound exam center; over the plantar first metatarsal. Significant undermining proximally. There has not been palpable bone although I think I am out of today. I cannot see this area Electronic Signature(s) Signed: 12/03/2022 4:19:19 PM By: Baltazar Najjar MD Entered By: Baltazar Najjar on 12/03/2022 10:41:17 -------------------------------------------------------------------------------- Physician Orders  Details Patient Name: Date of Service: [name], [name]. 12/03/2022 12:30 PM Medical Record Number: [id number] Patient Account Number: [id number] Date of Birth/Sex: Treating RN: 02/20/1980 (42 y.o. [name] Primary Care Provider: Gwinda Passe Other Clinician: Referring Provider: Treating Provider/Extender: Aurelio Brash in Treatment: 16 The following information was scribed by: Shawn Stall The information was scribed for: Baltazar Najjar Verbal / Phone Orders: No Diagnosis Coding NOLAH, LATSHAW ([id number]) [phone].pdf Page 4 of 9 Follow-up Appointments ppointment in 2 weeks. - ****Dr. Roselee Nova office to schedule first week of December***** Return A Other: - ****MRI 12/04/2022 scheduled**** pick up the second round of oral antibiotics. Anesthetic (In clinic) Topical Lidocaine 4% applied to wound bed Cellular or Tissue Based Products Wound #4 Right Amputation Site - Transmetatarsal Cellular or Tissue Based Product Type: - RUN IVR for APLIGRAF (09/12/22)-SENT NOTES Bathing/ Shower/ Hygiene May shower with protection but do not get wound  dressing(s) wet. Protect dressing(s) with water repellant cover (for example, large plastic bag) or a cast cover and may then take shower. Other Bathing/Shower/Hygiene Orders/Instructions: - When changing the dressing (every other day) you may get right foot wet. On days NOT changing the dressing.Keep wound/dressing dry Off-Loading Open toe surgical shoe to: Wound Treatment Wound #4 - Amputation Site - Transmetatarsal Wound Laterality: Right Cleanser: Soap and Water Every Other Day/30 Days Discharge Instructions: May shower and wash wound with dial antibacterial soap and water prior to dressing change. On days not changing the dressing, please keep wound/dressing dry Topical: Gentamicin Every Other Day/30 Days Discharge Instructions: As directed by physician Prim Dressing:  Hydrofera Blue Ready Transfer Foam, 4x5 (in/in) Every Other Day/30 Days ary Discharge Instructions: Apply to wound bed as instructed Secondary Dressing: ABD Pad, 8x10 Every Other Day/30 Days Discharge Instructions: Apply over primary dressing as directed. Secured With: [employer], 4.5x3.1 (in/yd) Every Other Day/30 Days Discharge Instructions: Secure with Kerlix as directed. Secured With: 52M Medipore H Soft Cloth Surgical T ape, 4 x 10 (in/yd) Every Other Day/30 Days Discharge Instructions: Secure with tape as directed. Secured With: ace wrap Every Other Day/30 Days Discharge Instructions: apply lightly to hold dressing in place. Patient Medications llergies: No Known Drug Allergies A Notifications Medication Indication Start End wound infection 12/03/2022 sulfamethoxazole-trimethoprim DOSE oral 800 mg-160 mg tablet - 1 tablet oral twice a day for 2 wks (continuing rx Electronic Signature(s) Signed: 12/03/2022 1:43:59 PM By: Baltazar Najjar MD Entered By: Baltazar Najjar on 12/03/2022 10:43:59 -------------------------------------------------------------------------------- Problem List Details Patient Name: Date of Service: [name], [name]. 12/03/2022 12:30 PM Medical Record Number: [id number] Patient Account Number: [id number] Date of Birth/Sex: Treating RN: 09-18-1980 (42 y.o. F) Primary Care Provider: Gwinda Passe Other Clinician: Referring Provider: Treating Provider/Extender: Aurelio Brash in Treatment: [address] Sharol Harness ([id number]) [id number] Page 5 of 9 Active Problems ICD-10 Encounter Code Description Active Date MDM Diagnosis L97.512 Non-pressure chronic ulcer of other part of right foot with fat layer exposed 08/08/2022 No Yes E11.621 Type 2 diabetes mellitus with foot ulcer 08/08/2022 No Yes M86.671 Other chronic osteomyelitis, right ankle and foot 08/08/2022 No Yes Inactive Problems Resolved  Problems Electronic Signature(s) Signed: 12/03/2022 4:19:19 PM By: Baltazar Najjar MD Entered By: Baltazar Najjar on 12/03/2022 10:39:03 -------------------------------------------------------------------------------- Progress Note Details Patient Name: Date of Service: [name] Delman [name], [name]. 12/03/2022 12:30 PM Medical Record Number: [id number] Patient Account Number: [id number] Date of Birth/Sex: Treating RN: 11-Jun-1980 (42 y.o. F) Primary Care Provider: Gwinda Passe Other Clinician: Referring Provider: Treating Provider/Extender: Aurelio Brash in Treatment: 16 Subjective History of Present Illness (HPI) Admission 01/22/2021 Ms. Shannae Estrela [name] a 42 year old female with a past medical history of insulin-dependent uncontrolled type 2 diabetes with last hemoglobin A1c of 13.5, osteomyelitis of the right foot status post transmetatarsal amputation on 12/18/2020 that presents to the clinic for right foot wound. She has had dehiscence of the surgical site. [name] using wet-to-dry dressings. She has a PICC line and receiving IV ceftriaxone daily for her osteomyelitis. There [name] an end date of 01/27/2021. [name] also taking oral metronidazole. She currently denies systemic signs of infection. 1/19; patient presents for follow-up. She was diagnosed with a DVT to the right lower extremity 2 days ago. [name] on Eliquis now. [name] scheduled to see her infectious disease doctor tomorrow. She has been using Dakin's wet-to-dry dressings. She denies systemic signs of infection. 1/26; patient  presents for follow-up. She saw infectious disease on 1/21 started on Augmentin. Her PICC line and IV ceftriaxone was discontinued. Patient reports stability to her wound. She has been using Dakin's wet-to-dry dressings. She currently denies systemic signs of infection. 2/3; patient presents for follow-up. She continues to use Dakin's wet-to-dry dressings to the wound bed. She saw  Dr. Manson Passey with infectious disease yesterday and [name] continuing Augmentin. T entative end date [name] 2/16. Patient reports following up with orthopedics. She states there [name] no further plan from them. She currently denies systemic signs of infection. 2/10; patient presents for follow-up. She continues to use Dakin's wet to dry dressings. [name] scheduled to have her MRI done on 2/14. She states that she had pain to the debridement site from last clinic visit and declines debridement today. She denies systemic signs of infection. She continues to have yellow thick drainage. 2/20; patient presents for follow-up. She continues to use Dakin's wet-to-dry dressings. She obtained her MRI. The results showed an abscess and [name] scheduled to see her orthopedic surgeon on 2/23. She saw infectious disease 2/17 and her antibiotics were extended. She currently denies systemic signs of infection. 3/6; patient presents for follow-up. She had debridement and irrigation of her foot on 03/10/2021 due to abscess noted on MRI. She was started on IV ceftriaxone and oral Flagyl. She has no issues or complaints today. She has been using iodoform packing to the tunnel and Dakin's wet-to-dry to the opening. 03/26/2021: She continues on IV ceftriaxone and oral metronidazole. She has follow-up with infectious disease tomorrow. No significant issues or complaints today. Her mother continues to help her with her wound dressing, using iodoform packing strips into the tunnel and Dakin's to the open portion of the wound. 3/20; patient presents for follow-up. She continues to be on IV ceftriaxone in oral metronidazole. She has been using iodoform to the tunnel and Dakin's wet-to- dry to the open wound. She denies signs of infection. KERTINA, MEHRER ([id number]) [id number] Page 6 of 9 3/27; patient presents for follow-up. She no longer has a PICC line. She has been using iodoform to the tunnel and Dakin's  wet-to-dry to the open wound. She reports improvement in wound healing. She denies signs of infection. 4/3; patient presents for follow-up. She states she has been using Hydrofera Blue to the open wound and iodoform packing to the tunnel without any issues. She denies signs of infection. 4/18; patient presents for follow-up. She saw infectious disease on 4/11. She has finished her oral antibiotics and completed a total of 6 weeks of antibiotics (this includes IV as well). No further antibiotics needed. She has been using Hydrofera Blue and iodoform packing. She states that the tunneled area has come in and the iodoform [name] not staying in place anymore. She has no issues or complaints today. She denies signs of infection. 4/24; patient presents for follow-up. She saw Dr. Carlene Coria, plastic surgery to discuss potential skin graft/substitute placement. At this time he thinks that the skin graft would likely not take. He [name] in agreement with trying a wound VAC. Patient has been using Hydrofera Blue dressing changes with no issues. She denies signs of infection. She reports improvement in wound healing. 5/1; patient presents for follow-up. Unfortunately patient did not have insurance when we ran for the pico. There [name] an assistance program and we are trying to get this accommodated for the patient. In the meantime she has been using Hydrofera Blue without any issues. She denies signs of  infection. 5/8; patient presents for follow-up. We have not heard back if pico [name] covered by her insurance. She has been using collagen to the wound bed over the past week. She denies signs of infection. 5/18; patient presents for follow-up. She has been using collagen to the wound bed without issues. Again we have not heard if pico [name] covered by her insurance. She has no issues or complaints today. 5/23; patient presents for follow-up. She has been using collagen to the wound bed. She has no issues or complaints today. She  obtained the wound VAC from [facility] and brought this in today. She denies signs of infection. 6/1; patient presents for follow-up. She has been using the wound VAC for the past week. She has had this changed twice since she was last here. She reports more maceration to the periwound. She denies signs of infection. 6/7; right TMA site. There are 2 wounds 0 separated by a bridge of healed tissue. The more lateral area has undermining. Both areas have healthy looking granulation at the base but relative the size of the wound [name] fairly deep. There [name] no exposed bone no evidence of infection. Her wound VAC was put on hold last week because of surrounding skin maceration she has been using collagen this week. She has a modified shoe 6/12; patient presents for follow-up. Last week the wound VAC was reinitiated. She had no issues with the wound VAC itself. Today she has maceration again noted to the surrounding skin. She denies signs of infection. 6/27; patient presents for follow-up. She has been using Medihoney to the wound bed. We took a break from the wound VAC because the periwound was macerated. She still has some areas of maceration to the distal foot where there [name] a callus. She currently denies signs of infection. 7/11; patient presents for follow-up. She has been using Medihoney to the wound bed. She has developed some increased warmth and erythema to the lateral aspect of the right foot. She states this [name] occurred over the past week and there [name] increased pain. No drainage noted. 7/17; patient presents for follow-up. She has been using Medihoney to the wound bed. She completed her course of Bactrim. She reports improvement in symptoms. 7/24; Patient presents for follow up. She has been using Medihoney to the wound bed without issues. She completed another course of Bactrim. She reports improvement in her symptoms but still has some mild tenderness to the medial aspect of the foot. 7/31; Patient  presents for follow-up. She has been using Medihoney and Dakin's to the wound bed. She denies signs of infection. 8/14; patient presents for follow-up. She has been using Dakin's wet-to-dry packing strips to the right medial aspect of the amputation site and Medihoney to the anterior site. She has no issues or complaints today. She has started physical therapy. She denies signs of infection. 8/29; patient presents for follow-up. She has been using Dakin's wet-to-dry packing strips to the right medial aspect of the amputation site however this [name] becoming more difficult to place. She did report that she had increased redness and swelling to that site and developed some drainage. It has resolved. She continues with physical therapy. 9/8; patient presents for follow-up. We have been using silver alginate to the tunneled area. She completed her course of antibiotics. She reports no pain, increased swelling or erythema. 9/21; patient presents for follow-up. She has been using gentamicin to the tunneled area. She has no issues or complaints today. She denies signs of  infection. 10/2; patient presents for follow-up. [name] scheduled to have her CT scan on 10/9. She currently denies signs of infection. She denies increased warmth, erythema or purulent drainage from the wound bed. She has been using Dakin's wet-to-dry dressings. 10/12; patient presents for follow-up. She had her CT scan on 10/9 that showed A small irregular rim-enhancing fluid pocket communicating to the overlying soft tissues of the sinus tract compatible with a small abscess. Currently she denies systemic signs of infection. She has been doing Dakin's wet-to-dry packing strips but it [name] hard for her to pack into the narrow opening. 10/30; patient presents for follow-up. Since last clinic visit she has had OR debridement of her Right foot by Dr. Odis Hollingshead Due to abscess noted on CT. She has been using iodoform packing. 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 She will complete 2 weeks of this and continue with oral amoxicillin for the next 6 to 12 months. She follows with Dr. Luciana Axe for this. She currently denies signs of infection. 11/13; patient presents for follow-up. Patient has been using silver alginate with gentamicin to the wound bed. She has no issues or complaints today she. She reports improvement in wound healing. 12/4; patient presents for follow-up. She has been using silver alginate and gentamicin to the wound bed. She has no issues or complaints today. 12/8; patient presents for follow-up. She has been using silver alginate with gentamicin to the wound bed. She presents for her first cast placement. She will be back early next week for her obligatory cast change. 12/12; patient presents for follow-up. We have been using collagen with antibiotic ointment to the wound bed under a total contact cast. She has tolerated this well. [name] improved in wound healing. 12/18; patient presents for follow-up. We have been using collagen with antibiotic ointment under the total contact cast. She has no complaints today. 1/2; The cast was placed at last clinic visit however patient missed her follow-up and this was taken off last week at a nurse visit. Patient has been using packing strips to the medial narrowed wound. She has noted no drainage. 1/22; patient presents for follow-up. She has been keeping the area covered. She reports no drainage. She has had no issues to the previous wound site. She denies any signs of infection including increased warmth, erythema or purulent drainage. [name], [name] ([id number]) [id number] Page 7 of 9 08/08/2022 [name] a 42 year old female with a past medical history of insulin-dependent uncontrolled type 2 diabetes with last hemoglobin A1c of 8.8, chronic osteomyelitis of the right foot status post transmetatarsal  amputation on 12/18/2020 that has been previously treated for surgical dehiscence of this site in our clinic. She presents today 6 months after discharge from our clinic due to healed right foot wound with now reopening of the previous surgical site. She reports chronic pain to the site however denies purulent drainage, increased warmth or erythema. She noticed the wound opening about 1 month ago. She has been doing physical therapy without significant issues. She has inserts to her shoe however she has been advised to follow-up with [employer] for new custom inserts by her orthopedic surgeon now that she has a new wound. She states she will try and do this. She has been using antibiotic ointment to the wound bed. 8/1; patient presents for follow-up. She has been taking her antibiotics without issues. She has been using Dakin's wet-to-dry dressings to the wound bed. The  wound [name] smaller. She denies systemic signs of infection. 8/13; patient presents for follow-up. She had ABIs completed that were normal. On the right it was 1.02 and on the left was 0.99. She has been using silver alginate to the wound bed with gentamicin ointment. She states it [name] hard to pack the wound bed. She currently denies signs of infection. 8/29; patient presents for follow-up. She has been using collagen with antibiotic ointment to the wound bed. Wound [name] stable. She denies signs of infection. 9/5; patient presents for follow-up. She has been using silver alginate to the wound bed. Wound [name] slightly wider. She denies signs of infection. 9/12; patient presents for follow-up. She has been using silver alginate to the wound bed. Wound [name] slightly smaller today. 9/19; right foot in the setting of a TMA over the first metatarsal head. She has thick callus around this area small fissured wound which actually looks like it [name] attempting to close. She has a offloading shoe I am not sure how much activity [name] in. I did not have a lot of  time to see her today because of transportation 9/26; right foot in the setting of a previous TMA. The wound [name] over the plantar first metatarsal head. This [name] a fissured wound with thick callus around the margins. It [name] small in terms of overall surface area but has a depth of about 0.6 cm there was no palpable bone. She has a history of chronic osteomyelitis although she has been treated for this I have reviewed CT scans from 2023 also an MRI from 10/26/2021. Neither imaging study showed osteomyelitis in this area although the CT scan suggested osteomyelitis in the fourth metatarsal head. There has been problems with abscesses communicating with the wound although there does not appear to be purulent drainage currently 10/3; right foot at the TMA site. Small probing wound. I changed her to Iodosorb with gauze last time. May be some improvement. She has a depth of about 0.6 cm but there [name] no palpable bone. She has been previously treated for chronic osteomyelitis in this area. 10/17; this [name] a patient with a probing wound at the right TMA site. Underlying osteomyelitis previously treated earlier this year. I been using Iodosorb to try and help constrict this wound however today she comes in with a larger circumference a larger tunnel and this probably sits right on top of bone. Also worrisome [name] there was a streaking dark area just caudal to the wound on the dorsal surface of the foot. The patient [name] complaining of more pain but no fever. 10/24; culture of this area [bone scraping] revealed Acinetobacter and 2 coag negative staph. I am going to give her Septra DS 1 p.o. twice daily for 2 weeks. I think it would be simplistic to call this a bone culture but it certainly was a deep culture. An x-ray was done but that has not been reported yet. I had wanted to get lab work including a CBC with differential ESR and C-reactive protein but I am not actually sure that got done last week. We have been using  gent and silver alginate. 11/7; patient [name] still taking trimethoprim/sulfamethoxazole for the assessment of bacterial identified last week. Lab work revealed a C-reactive protein of 4.1 which [name] elevated from 1.7 but not dramatic. White count was normal at 8.4 x-ray of the right foot revealed minimal new lucency and cortical erosion within the adjacent distal plantar aspect of the metatarsals concerning for acute osteomyelitis  early. 11/19; the patient has completed her Septra DS which I gave to her for 2 weeks. She has her MRI tomorrow but then we will have the obligatory 5 to 10- day/workday wait to get a report. I am going to resume her Septra DS for a further 2 weeks. Then decide on additional antibiotics, infectious disease etc. Objective Constitutional Sitting or standing Blood Pressure [name] within target range for patient.. Pulse regular and within target range for patient.Marland Kitchen Respirations regular, non-labored and within target range.. Temperature [name] normal and within the target range for the patient.Marland Kitchen Appears in no distress. Vitals Time Taken: 12:53 PM, Height: 69 in, Weight: 330 lbs, BMI: 48.7, Temperature: 98.3 F, Pulse: 102 bpm, Respiratory Rate: 18 breaths/min, Blood Pressure: 111/80 mmHg. General Notes: Wound exam center; over the plantar first metatarsal. Significant undermining proximally. There has not been palpable bone although I think I am out of today. I cannot see this area Integumentary (Hair, Skin) Wound #4 status [name] Open. Original cause of wound was Shear/Friction. The date acquired was: 07/08/2022. The wound has been in treatment 16 weeks. The wound [name] located on the Right Amputation Site - Transmetatarsal. The wound measures 1.1cm length x 0.4cm width x 0.6cm depth; 0.346cm^2 area and 0.207cm^3 volume. There [name] Fat Layer (Subcutaneous Tissue) exposed. There [name] no undermining noted, however, there [name] tunneling at 7:00 with a maximum distance of 1cm. There [name] a medium amount of  serosanguineous drainage noted. The wound margin [name] distinct with the outline attached to the wound base. There [name] large (67-100%) pink, pale granulation within the wound bed. There [name] a small (1-33%) amount of necrotic tissue within the wound bed including Adherent Slough. The periwound skin appearance exhibited: Callus, Maceration. The periwound skin appearance did not exhibit: Crepitus, Excoriation, Induration, Rash, Scarring, Dry/Scaly, Atrophie Blanche, Cyanosis, Ecchymosis, Hemosiderin Staining, Mottled, Pallor, Rubor, Erythema. Periwound temperature was noted as No Abnormality. The periwound has tenderness on palpation. Assessment [name], [name] ([id number]) [id number] Page 8 of 9 Active Problems ICD-10 Non-pressure chronic ulcer of other part of right foot with fat layer exposed Type 2 diabetes mellitus with foot ulcer Other chronic osteomyelitis, right ankle and foot Plan Follow-up Appointments: Return Appointment in 2 weeks. - ****Dr. Roselee Nova office to schedule first week of December***** Other: - ****MRI 12/04/2022 scheduled**** pick up the second round of oral antibiotics. Anesthetic: (In clinic) Topical Lidocaine 4% applied to wound bed Cellular or Tissue Based Products: Wound #4 Right Amputation Site - Transmetatarsal: Cellular or Tissue Based Product Type: - RUN IVR for APLIGRAF (09/12/22)-SENT NOTES Bathing/ Shower/ Hygiene: May shower with protection but do not get wound dressing(s) wet. Protect dressing(s) with water repellant cover (for example, large plastic bag) or a cast cover and may then take shower. Other Bathing/Shower/Hygiene Orders/Instructions: - When changing the dressing (every other day) you may get right foot wet. On days NOT changing the dressing.Keep wound/dressing dry Off-Loading: Open toe surgical shoe to: WOUND #4: - Amputation Site - Transmetatarsal Wound Laterality: Right Cleanser: Soap and Water Every Other Day/30  Days Discharge Instructions: May shower and wash wound with dial antibacterial soap and water prior to dressing change. On days not changing the dressing, please keep wound/dressing dry Topical: Gentamicin Every Other Day/30 Days Discharge Instructions: As directed by physician Prim Dressing: Hydrofera Blue Ready Transfer Foam, 4x5 (in/in) Every Other Day/30 Days ary Discharge Instructions: Apply to wound bed as instructed Secondary Dressing: ABD Pad, 8x10 Every Other Day/30 Days Discharge Instructions: Apply over primary dressing as  directed. Secured With: [employer], 4.5x3.1 (in/yd) Every Other Day/30 Days Discharge Instructions: Secure with Kerlix as directed. Secured With: 47M Medipore H Soft Cloth Surgical T ape, 4 x 10 (in/yd) Every Other Day/30 Days Discharge Instructions: Secure with tape as directed. Secured With: ace wrap Every Other Day/30 Days Discharge Instructions: apply lightly to hold dressing in place. 1. Change the dressing to Hydrofera Blue 2. I am going to resume her Septra DS for further 2 weeks until we can review the MRI. I am not here next week. 3. Nothing looks any better today in fact the depth of the undermining area may approach bone Electronic Signature(s) Signed: 12/03/2022 4:19:19 PM By: Baltazar Najjar MD Entered By: Baltazar Najjar on 12/03/2022 10:42:01 -------------------------------------------------------------------------------- SuperBill Details Patient Name: Date of Service: [name], [name]. 12/03/2022 Medical Record Number: [id number] Patient Account Number: [id number] Date of Birth/Sex: Treating RN: 10-31-1980 (42 y.o. [name] Primary Care Provider: Gwinda Passe Other Clinician: Referring Provider: Treating Provider/Extender: Aurelio Brash in Treatment: 16 Diagnosis Coding ICD-10 Codes Code Description [id number] Non-pressure chronic ulcer of other part of right foot with fat layer  exposed E11.621 Type 2 diabetes mellitus with foot ulcer Bunn, Avanti [name] ([id number]) [id number] Page 9 of 9 M86.671 Other chronic osteomyelitis, right ankle and foot Facility Procedures : CPT4 Code: [id number] Description: 99213 - WOUND CARE VISIT-LEV 3 EST PT Modifier: Quantity: 1 Physician Procedures : CPT4 Code Description Modifier [id number] 99214 - WC PHYS LEVEL 4 - EST PT ICD-10 Diagnosis Description L97.512 Non-pressure chronic ulcer of other part of right foot with fat layer exposed E11.621 Type 2 diabetes mellitus with foot ulcer M86.671 Other  chronic osteomyelitis, right ankle and foot Quantity: 1 Electronic Signature(s) Signed: 12/03/2022 4:19:19 PM By: Baltazar Najjar MD Entered By: Baltazar Najjar on 12/03/2022 10:44:22

## 2022-12-03 NOTE — Progress Notes (Signed)
RONAN, BATAILLE ([id number]) [id number] Page 1 of 10 Visit Report for 12/03/2022 Arrival Information Details Patient Name: Date of Service: [name], [name]. 12/03/2022 12:30 PM Medical Record Number: [id number] Patient Account Number: [id number] Date of Birth/Sex: Treating RN: [birth year] (42 y.o. F) Primary Care Burnett Lieber: Gwinda Passe Other Clinician: Referring Hanna Aultman: Treating Ernest Orr/Extender: Aurelio Brash in Treatment: 16 Visit Information History Since Last Visit Added or deleted any medications: No Patient Arrived: Wheel Chair Any new allergies or adverse reactions: No Arrival Time: 12:42 Had a fall or experienced change in No Accompanied By: self activities of daily living that may affect Transfer Assistance: None risk of falls: Patient Identification Verified: Yes Signs or symptoms of abuse/neglect since last visito No Secondary Verification Process Completed: Yes Hospitalized since last visit: No Patient Requires Transmission-Based Precautions: No Implantable device outside of the clinic excluding No Patient Has Alerts: Yes cellular tissue based products placed in the center Patient Alerts: Patient on Blood Thinner since last visit: Eliquis Has Dressing in Place as Prescribed: Yes ABI R 1.02 (08/21/22) Pain Present Now: No ABI [name] 0.99 (08/21/22) Electronic Signature(s) Signed: 12/03/2022 4:41:35 PM By: Thayer Dallas Entered By: Thayer Dallas on 12/03/2022 09:46:41 -------------------------------------------------------------------------------- Clinic Level of Care Assessment Details Patient Name: Date of Service: [name] V IS, [name]. 12/03/2022 12:30 PM Medical Record Number: [id number] Patient Account Number: [id number] Date of Birth/Sex: Treating RN: Jun 04, 1980 (42 y.o. [name] Primary Care Jurney Overacker: Gwinda Passe Other Clinician: Referring Rayen Dafoe: Treating Ayanni Tun/Extender: Aurelio Brash in Treatment: 16 Clinic Level of Care Assessment Items TOOL 4 Quantity Score X- 1 0 Use when only an EandM is performed on FOLLOW-UP visit ASSESSMENTS - Nursing Assessment / Reassessment X- 1 10 Reassessment of Co-morbidities (includes updates in patient status) X- 1 5 Reassessment of Adherence to Treatment Plan ASSESSMENTS - Wound and Skin A ssessment / Reassessment X - Simple Wound Assessment / Reassessment - one wound 1 5 []  - 0 Complex Wound Assessment / Reassessment - multiple wounds []  - 0 Dermatologic / Skin Assessment (not related to wound area) ASSESSMENTS - Focused Assessment X- 1 5 Circumferential Edema Measurements - multi extremities []  - 0 Nutritional Assessment / Counseling / Intervention [name], [name] ([id number]) [id number] Page 2 of 10 []  - 0 Lower Extremity Assessment (monofilament, tuning fork, pulses) []  - 0 Peripheral Arterial Disease Assessment (using hand held doppler) ASSESSMENTS - Ostomy and/or Continence Assessment and Care []  - 0 Incontinence Assessment and Management []  - 0 Ostomy Care Assessment and Management (repouching, etc.) PROCESS - Coordination of Care X - Simple Patient / Family Education for ongoing care 1 15 []  - 0 Complex (extensive) Patient / Family Education for ongoing care X- 1 10 Staff obtains Chiropractor, Records, T Results / Process Orders est []  - 0 Staff telephones HHA, Nursing Homes / Clarify orders / etc []  - 0 Routine Transfer to another Facility (non-emergent condition) []  - 0 Routine Hospital Admission (non-emergent condition) []  - 0 New Admissions / Manufacturing engineer / Ordering NPWT Apligraf, etc. , []  - 0 Emergency Hospital Admission (emergent condition) X- 1 10 Simple Discharge Coordination []  - 0 Complex (extensive) Discharge Coordination PROCESS - Special Needs []  - 0 Pediatric / Minor Patient Management []  - 0 Isolation Patient  Management []  - 0 Hearing / Language / Visual special needs []  - 0 Assessment of Community assistance (transportation, D/C planning, etc.) []  - 0 Additional assistance / Altered mentation []  - 0 Support Surface(s)  Assessment (bed, cushion, seat, etc.) INTERVENTIONS - Wound Cleansing / Measurement X - Simple Wound Cleansing - one wound 1 5 []  - 0 Complex Wound Cleansing - multiple wounds X- 1 5 Wound Imaging (photographs - any number of wounds) []  - 0 Wound Tracing (instead of photographs) X- 1 5 Simple Wound Measurement - one wound []  - 0 Complex Wound Measurement - multiple wounds INTERVENTIONS - Wound Dressings []  - 0 Small Wound Dressing one or multiple wounds X- 1 15 Medium Wound Dressing one or multiple wounds []  - 0 Large Wound Dressing one or multiple wounds X- 1 5 Application of Medications - topical []  - 0 Application of Medications - injection INTERVENTIONS - Miscellaneous []  - 0 External ear exam []  - 0 Specimen Collection (cultures, biopsies, blood, body fluids, etc.) []  - 0 Specimen(s) / Culture(s) sent or taken to Lab for analysis []  - 0 Patient Transfer (multiple staff / Nurse, adult / Similar devices) []  - 0 Simple Staple / Suture removal (25 or less) []  - 0 Complex Staple / Suture removal (26 or more) []  - 0 Hypo / Hyperglycemic Management (close monitor of Blood Glucose) [name], [name] ([id number]) [id number] Page 3 of 10 []  - 0 Ankle / Brachial Index (ABI) - do not check if billed separately X- 1 5 Vital Signs Has the patient been seen at the hospital within the last three years: Yes Total Score: 100 Level Of Care: New/Established - Level 3 Electronic Signature(s) Signed: 12/03/2022 4:47:01 PM By: Shawn Stall RN, BSN Entered By: Shawn Stall on 12/03/2022 10:31:17 -------------------------------------------------------------------------------- Encounter Discharge Information Details Patient Name: Date of  Service: [name] V IS, [name]. 12/03/2022 12:30 PM Medical Record Number: [id number] Patient Account Number: [id number] Date of Birth/Sex: Treating RN: May 02, 1980 (42 y.o. [name] Primary Care Zaina Jenkin: Gwinda Passe Other Clinician: Referring Konstantin Lehnen: Treating Tyller Bowlby/Extender: Aurelio Brash in Treatment: 16 Encounter Discharge Information Items Discharge Condition: Stable Ambulatory Status: Wheelchair Discharge Destination: Home Transportation: Private Auto Accompanied By: self Schedule Follow-up Appointment: Yes Clinical Summary of Care: Electronic Signature(s) Signed: 12/03/2022 4:47:01 PM By: Shawn Stall RN, BSN Entered By: Shawn Stall on 12/03/2022 10:31:44 -------------------------------------------------------------------------------- Lower Extremity Assessment Details Patient Name: Date of Service: [name] V IS, [name]. 12/03/2022 12:30 PM Medical Record Number: [id number] Patient Account Number: [id number] Date of Birth/Sex: Treating RN: 1980/04/30 (42 y.o. F) Primary Care Sims Laday: Gwinda Passe Other Clinician: Referring Chaylee Ehrsam: Treating Jaishaun Mcnab/Extender: Aurelio Brash in Treatment: 16 Edema Assessment Assessed: Kyra Searles: No] Franne Forts: No] Edema: [Left: Ye] [Right: s] Calf Left: Right: Point of Measurement: 31 cm From Medial Instep 51 cm Ankle Left: Right: Point of Measurement: 9 cm From Medial Instep 28.4 cm Vascular Assessment Brault, Shalaine [name] ([id number]) [Right:[id number] Page 4 of 10] Extremity colors, hair growth, and conditions: Extremity Color: [Right:Hyperpigmented] Hair Growth on Extremity: [Right:No] Temperature of Extremity: [Right:Warm] Capillary Refill: [Right:< 3 seconds] Dependent Rubor: [Right:No Yes] Electronic Signature(s) Signed: 12/03/2022 4:41:35 PM By: Thayer Dallas Entered By: Thayer Dallas on 12/03/2022  09:53:39 -------------------------------------------------------------------------------- Multi Wound Chart Details Patient Name: Date of Service: [name] V IS, [name]. 12/03/2022 12:30 PM Medical Record Number: [id number] Patient Account Number: [id number] Date of Birth/Sex: Treating RN: 1980-07-07 (42 y.o. F) Primary Care Amdrew Oboyle: Gwinda Passe Other Clinician: Referring Mackensie Pilson: Treating Rayshaun Needle/Extender: Aurelio Brash in Treatment: 16 Vital Signs Height(in): 69 Pulse(bpm): 102 Weight(lbs): 330 Blood Pressure(mmHg): 111/80 Body Mass Index(BMI): 48.7 Temperature(F): 98.3 Respiratory Rate(breaths/min): 18 [4:Photos:] [N/A:N/A] Right Amputation Site -  N/A N/A Wound Location: Transmetatarsal Shear/Friction N/A N/A Wounding Event: Diabetic Wound/Ulcer of the Lower N/A N/A Primary Etiology: Extremity Dehisced Wound N/A N/A Secondary Etiology: Deep Vein Thrombosis, Hypertension, N/A N/A Comorbid History: Type II Diabetes, Osteomyelitis, Neuropathy 07/08/2022 N/A N/A Date Acquired: 16 N/A N/A Weeks of Treatment: Open N/A N/A Wound Status: No N/A N/A Wound Recurrence: 1.1x0.4x0.6 N/A N/A Measurements [name] x W x D (cm) 0.346 N/A N/A A (cm) : rea 0.207 N/A N/A Volume (cm) : -57.30% N/A N/A % Reduction in A rea: -17.60% N/A N/A % Reduction in Volume: 7 Position 1 (o'clock): 1 Maximum Distance 1 (cm): Yes N/A N/A Tunneling: Grade 3 N/A N/A Classification: Medium N/A N/A Exudate A mount: Serosanguineous N/A N/A Exudate Type: red, brown N/A N/A Exudate Color: Distinct, outline attached N/A N/A Wound Margin: Large (67-100%) N/A N/A Granulation A mount: Pink, Pale N/A N/A Granulation Quality: Small (1-33%) N/A N/A Necrotic A mount: Fat Layer (Subcutaneous Tissue): Yes N/A N/A Exposed Structures: ARLYLE, STRATE [name] ([id number]) [id number] Page 5 of 10 Fascia: No Tendon: No Muscle: No Joint: No Bone:  No Small (1-33%) N/A N/A Epithelialization: Callus: Yes N/A N/A Periwound Skin Texture: Excoriation: No Induration: No Crepitus: No Rash: No Scarring: No Maceration: Yes N/A N/A Periwound Skin Moisture: Dry/Scaly: No Atrophie Blanche: No N/A N/A Periwound Skin Color: Cyanosis: No Ecchymosis: No Erythema: No Hemosiderin Staining: No Mottled: No Pallor: No Rubor: No No Abnormality N/A N/A Temperature: Yes N/A N/A Tenderness on Palpation: Treatment Notes Wound #4 (Amputation Site - Transmetatarsal) Wound Laterality: Right Cleanser Soap and Water Discharge Instruction: May shower and wash wound with dial antibacterial soap and water prior to dressing change. On days not changing the dressing, please keep wound/dressing dry Peri-Wound Care Topical Gentamicin Discharge Instruction: As directed by physician Primary Dressing Hydrofera Blue Ready Transfer Foam, 4x5 (in/in) Discharge Instruction: Apply to wound bed as instructed Secondary Dressing ABD Pad, 8x10 Discharge Instruction: Apply over primary dressing as directed. Secured With [employer], 4.5x3.1 (in/yd) Discharge Instruction: Secure with Kerlix as directed. 22M Medipore H Soft Cloth Surgical T ape, 4 x 10 (in/yd) Discharge Instruction: Secure with tape as directed. ace wrap Discharge Instruction: apply lightly to hold dressing in place. Compression Wrap Compression Stockings Add-Ons Electronic Signature(s) Signed: 12/03/2022 4:19:19 PM By: Baltazar Najjar MD Entered By: Baltazar Najjar on 12/03/2022 10:39:12 -------------------------------------------------------------------------------- Multi-Disciplinary Care Plan Details Patient Name: Date of Service: [name] V IS, [name]. 12/03/2022 12:30 PM [name], [name] ([id number]) [id number] Page 6 of 10 Medical Record Number: [id number] Patient Account Number: [id number] Date of Birth/Sex: Treating RN: 02/07/1980 (42 y.o. [name], [name] Primary Care Zehava Turski: Gwinda Passe Other Clinician: Referring Edan Juday: Treating Naquan Garman/Extender: Aurelio Brash in Treatment: 16 Active Inactive Necrotic Tissue Nursing Diagnoses: Knowledge deficit related to management of necrotic/devitalized tissue Goals: Necrotic/devitalized tissue will be minimized in the wound bed Date Initiated: 08/08/2022 Target Resolution Date: 01/13/2024 Goal Status: Active Patient/caregiver will verbalize understanding of reason and process for debridement of necrotic tissue Date Initiated: 08/08/2022 Target Resolution Date: 01/13/2024 Goal Status: Active Interventions: Assess patient pain level pre-, during and post procedure and prior to discharge Provide education on necrotic tissue and debridement process Notes: Nutrition Nursing Diagnoses: Impaired glucose control: actual or potential Goals: Patient/caregiver agrees to and verbalizes understanding of need to obtain nutritional consultation Date Initiated: 08/08/2022 Target Resolution Date: 01/13/2024 Goal Status: Active Patient/caregiver will maintain therapeutic glucose control Date Initiated: 08/08/2022 Target Resolution Date: 01/13/2024 Goal Status: Active Interventions: Assess HgA1c  results as ordered upon admission and as needed Provide education on elevated blood sugars and impact on wound healing Provide education on nutrition Treatment Activities: Obtain HgA1c : 08/08/2022 Patient referred to Primary Care Physician for further nutritional evaluation : 08/08/2022 Notes: Pain, Acute or Chronic Nursing Diagnoses: Pain, acute or chronic: actual or potential Potential alteration in comfort, pain Goals: Patient will verbalize adequate pain control and receive pain control interventions during procedures as needed Date Initiated: 08/08/2022 Target Resolution Date: 01/13/2024 Goal Status: Active Patient/caregiver will verbalize comfort level  met Date Initiated: 08/08/2022 Target Resolution Date: 01/13/2024 Goal Status: Active Interventions: Complete pain assessment as per visit requirements Encourage patient to take pain medications as prescribed Provide education on pain management Treatment Activities: Administer pain control measures as ordered : 08/08/2022 Notes: [name], [name] ([id number]) [id number].pdf Page 7 of 10 Wound/Skin Impairment Nursing Diagnoses: Knowledge deficit related to ulceration/compromised skin integrity Goals: Patient/caregiver will verbalize understanding of skin care regimen Date Initiated: 08/08/2022 Target Resolution Date: 01/13/2024 Goal Status: Active Ulcer/skin breakdown will heal within 14 weeks Date Initiated: 08/08/2022 Target Resolution Date: 01/13/2024 Goal Status: Active Interventions: Assess patient/caregiver ability to perform ulcer/skin care regimen upon admission and as needed Assess ulceration(s) every visit Provide education on ulcer and skin care Treatment Activities: Skin care regimen initiated : 08/08/2022 Topical wound management initiated : 08/08/2022 Notes: Electronic Signature(s) Signed: 12/03/2022 4:47:01 PM By: Shawn Stall RN, BSN Entered By: Shawn Stall on 12/03/2022 10:27:12 -------------------------------------------------------------------------------- Pain Assessment Details Patient Name: Date of Service: [name] [name], [name]. 12/03/2022 12:30 PM Medical Record Number: [id number] Patient Account Number: [id number] Date of Birth/Sex: Treating RN: Jan 22, 1980 (42 y.o. F) Primary Care Vertis Bauder: Gwinda Passe Other Clinician: Referring Kyrin Garn: Treating Corynne Scibilia/Extender: Aurelio Brash in Treatment: 16 Active Problems Location of Pain Severity and Description of Pain Patient Has Paino No Site Locations Pain Management and Medication Current Pain Management: Electronic Signature(s) Signed: 12/03/2022  4:41:35 PM By: Youlanda Mighty, [name] ([id number]) [id number] Page 8 of 10 Entered By: Thayer Dallas on 12/03/2022 09:46:50 -------------------------------------------------------------------------------- Patient/Caregiver Education Details Patient Name: Date of Service: [name] V IS, [name]. 11/19/2024andnbsp12:30 PM Medical Record Number: [id number] Patient Account Number: [id number] Date of Birth/Gender: Treating RN: 01-Nov-1980 (42 y.o. [name] Primary Care Physician: Gwinda Passe Other Clinician: Referring Physician: Treating Physician/Extender: Aurelio Brash in Treatment: 16 Education Assessment Education Provided To: Patient Education Topics Provided Wound/Skin Impairment: Handouts: Caring for Your Ulcer Methods: Explain/Verbal Responses: Reinforcements needed Electronic Signature(s) Signed: 12/03/2022 4:47:01 PM By: Shawn Stall RN, BSN Entered By: Shawn Stall on 12/03/2022 10:27:50 -------------------------------------------------------------------------------- Wound Assessment Details Patient Name: Date of Service: [name] [name], [name]. 12/03/2022 12:30 PM Medical Record Number: [id number] Patient Account Number: [id number] Date of Birth/Sex: Treating RN: 11/30/1980 (42 y.o. 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 Secondary Dehisced Wound Wounding Event: Shear/Friction Etiology: Date Acquired: 07/08/2022 Wound Status: Open Weeks Of Treatment: 16 Comorbid Deep Vein Thrombosis, Hypertension, Type II Diabetes, Clustered Wound: No History: Osteomyelitis, Neuropathy Photos Westergaard, [name] ([id number])  [id number] Page 9 of 10 Wound Measurements Length: (cm) 1.1 Width: (cm) 0.4 Depth: (cm) 0.6 Area: (cm) 0.346 Volume: (cm) 0.207 % Reduction in Area: -57.3% % Reduction in Volume: -17.6% Epithelialization: Small (1-33%) Tunneling: Yes Position (o'clock): 7 Maximum Distance: (cm)  1 Undermining: No Wound Description Classification: Grade 3 Wound Margin: Distinct, outline attached Exudate Amount: Medium Exudate Type: Serosanguineous Exudate Color: red, brown Foul Odor After Cleansing: No Slough/Fibrino Yes Wound Bed Granulation Amount: Large (67-100%) Exposed Structure Granulation Quality: Pink, Pale Fascia Exposed: No Necrotic Amount: Small (1-33%) Fat Layer (Subcutaneous Tissue) Exposed: Yes Necrotic Quality: Adherent Slough Tendon Exposed: No Muscle Exposed: No Joint Exposed: No Bone Exposed: No Periwound Skin Texture Texture Color No Abnormalities Noted: No No Abnormalities Noted: No Callus: Yes Atrophie Blanche: No Crepitus: No Cyanosis: No Excoriation: No Ecchymosis: No Induration: No Erythema: No Rash: No Hemosiderin Staining: No Scarring: No Mottled: No Pallor: No Moisture Rubor: No No Abnormalities Noted: No Dry / Scaly: No Temperature / Pain Maceration: Yes Temperature: No Abnormality Tenderness on Palpation: Yes Treatment Notes Wound #4 (Amputation Site - Transmetatarsal) Wound Laterality: Right Cleanser Soap and Water Discharge Instruction: May shower and wash wound with dial antibacterial soap and water prior to dressing change. On days not changing the dressing, please keep wound/dressing dry Peri-Wound Care Topical Gentamicin Discharge Instruction: As directed by physician Primary Dressing Hydrofera Blue Ready Transfer Foam, 4x5 (in/in) Discharge Instruction: Apply to wound bed as instructed Secondary Dressing ABD Pad, 8x10 Discharge Instruction: Apply over primary dressing as directed. Secured With [employer], 4.5x3.1 (in/yd) Discharge Instruction: Secure with Kerlix as directed. 78M Medipore H Soft Cloth Surgical T ape, 4 x 10 (in/yd) Discharge Instruction: Secure with tape as directed. ace wrap Discharge Instruction: apply lightly to hold dressing in place. [name], [name] ([id number]) [id number] Page 10 of 10 Compression Wrap Compression Stockings Add-Ons Electronic Signature(s) Signed: 12/03/2022 4:41:35 PM By: Thayer Dallas Entered By: Thayer Dallas on 12/03/2022 09:57:33 -------------------------------------------------------------------------------- Vitals Details Patient Name: Date of Service: [name] V IS, [name]. 12/03/2022 12:30 PM Medical Record Number: [id number] Patient Account Number: [id number] Date of Birth/Sex: Treating RN: [birth year] (42 y.o. F) Primary Care Arkie Tagliaferro: Gwinda Passe Other Clinician: Referring Usher Hedberg: Treating Thatiana Renbarger/Extender: Aurelio Brash in Treatment: 16 Vital Signs Time Taken: 12:53 Temperature (F): 98.3 Height (in): 69 Pulse (bpm): 102 Weight (lbs): 330 Respiratory Rate (breaths/min): 18 Body Mass Index (BMI): 48.7 Blood Pressure (mmHg): 111/80 Reference Range: 80 - 120 mg / dl Electronic Signature(s) Signed: 12/03/2022 4:41:35 PM By: Thayer Dallas Entered By: Thayer Dallas on 12/03/2022 09:53:27

## 2022-12-04 ENCOUNTER — Telehealth (INDEPENDENT_AMBULATORY_CARE_PROVIDER_SITE_OTHER): Payer: Self-pay | Admitting: Primary Care

## 2022-12-04 ENCOUNTER — Other Ambulatory Visit: Payer: Self-pay

## 2022-12-04 ENCOUNTER — Ambulatory Visit (HOSPITAL_COMMUNITY)
Admission: RE | Admit: 2022-12-04 | Discharge: 2022-12-04 | Disposition: A | Discharge status: Home / Self Care | Payer: Medicaid Other | Source: Ambulatory Visit | Attending: Internal Medicine | Admitting: Internal Medicine

## 2022-12-04 DIAGNOSIS — E11621 Type 2 diabetes mellitus with foot ulcer: Secondary | ICD-10-CM | POA: Insufficient documentation

## 2022-12-04 DIAGNOSIS — L97519 Non-pressure chronic ulcer of other part of right foot with unspecified severity: Secondary | ICD-10-CM | POA: Diagnosis present

## 2022-12-04 MED ORDER — GADOBUTROL 1 MMOL/ML IV SOLN
10.0000 mL | Freq: Once | INTRAVENOUS | Status: AC | PRN
  Administered 2022-12-04: 10 mL via INTRAVENOUS

## 2022-12-04 NOTE — Telephone Encounter (Signed)
Called to confirm apt with pt. Will be present

## 2022-12-05 ENCOUNTER — Ambulatory Visit (INDEPENDENT_AMBULATORY_CARE_PROVIDER_SITE_OTHER): Payer: Medicaid Other | Admitting: Primary Care

## 2022-12-05 DIAGNOSIS — E785 Hyperlipidemia, unspecified: Secondary | ICD-10-CM

## 2022-12-05 DIAGNOSIS — E1165 Type 2 diabetes mellitus with hyperglycemia: Secondary | ICD-10-CM

## 2022-12-06 ENCOUNTER — Other Ambulatory Visit: Payer: Self-pay

## 2022-12-11 ENCOUNTER — Encounter (INDEPENDENT_AMBULATORY_CARE_PROVIDER_SITE_OTHER): Payer: Self-pay | Admitting: Primary Care

## 2022-12-11 ENCOUNTER — Telehealth (INDEPENDENT_AMBULATORY_CARE_PROVIDER_SITE_OTHER): Payer: Self-pay | Admitting: Primary Care

## 2022-12-11 ENCOUNTER — Ambulatory Visit (INDEPENDENT_AMBULATORY_CARE_PROVIDER_SITE_OTHER): Payer: Medicaid Other | Admitting: Primary Care

## 2022-12-11 VITALS — BP 110/74 | HR 80 | Resp 16

## 2022-12-11 DIAGNOSIS — Z7984 Long term (current) use of oral hypoglycemic drugs: Secondary | ICD-10-CM

## 2022-12-11 DIAGNOSIS — E1165 Type 2 diabetes mellitus with hyperglycemia: Secondary | ICD-10-CM

## 2022-12-11 LAB — POCT GLYCOSYLATED HEMOGLOBIN (HGB A1C): HbA1c, POC (controlled diabetic range): 11.1 % — AB (ref 0.0–7.0)

## 2022-12-11 NOTE — Progress Notes (Unsigned)
[facility]  [name], is a 42 y.o. female  RUE:[id number]  JYN:[id number]  DOB - [birth year]  Chief Complaint  Patient presents with   Diabetes       Subjective:   [name] is a 43 y.o. female here today for a follow up visit management of diabetes. Denies polyuria, polydipsia, polyphasia or vision changes.  Does not check blood sugars at home.  No problems updated.  No Known Allergies  Past Medical History:  Diagnosis Date   Class 3 obesity 12/16/2020   Diabetes mellitus     Current Outpatient Medications on File Prior to Visit  Medication Sig Dispense Refill   amoxicillin (AMOXIL) 500 MG capsule Take 1 capsule (500 mg total) by mouth 3 (three) times daily. (Patient not taking: Reported on 12/11/2022) 90 capsule 2   amoxicillin-clavulanate (AUGMENTIN) 875-125 MG tablet Take 1 tablet by mouth 2 (two) times daily for 14 days. (Patient not taking: Reported on 12/11/2022) 28 tablet 0   apixaban (ELIQUIS) 5 MG TABS tablet Take 1 tablet (5mg ) by mouth twice daily. 180 tablet 1   atorvastatin (LIPITOR) 80 MG tablet Take 1 tablet (80 mg total) by mouth once daily. 90 tablet 3   Blood Glucose Monitoring Suppl (TRUE METRIX METER) w/Device KIT Use to check blood sugar twice a day. 1 kit 0   doxycycline (VIBRA-TABS) 100 MG tablet Take 1 tablet (100 mg total) by mouth 2 (two) times daily for 14 day.s (Patient not taking: Reported on 12/11/2022) 28 tablet 0   Dulaglutide (TRULICITY) 1.5 MG/0.5ML SOPN Inject 1.5 mg into the skin once a week. 6 mL 0   empagliflozin (JARDIANCE) 10 MG TABS tablet Take 1 tablet (10 mg total) by mouth daily. 30 tablet 2   gabapentin (NEURONTIN) 300 MG capsule Take 2 capsules by mouth 3 times daily. 180 capsule 2   gentamicin ointment (GARAMYCIN) 0.1 % Apply 1 Application topically daily. 30 g 0   glucose blood (TRUE METRIX BLOOD GLUCOSE TEST) test strip Use to check blood sugar twice a day. 100 each 2   insulin glargine (LANTUS SOLOSTAR)  100 UNIT/ML Solostar Pen Inject 70 Units into the skin daily. 15 mL 3   Insulin Pen Needle 32G X 4 MM MISC use as directed 100 each 2   lisinopril (ZESTRIL) 2.5 MG tablet Take 1 tablet (2.5 mg total) by mouth daily. 90 tablet 0   metFORMIN (GLUCOPHAGE-XR) 500 MG 24 hr tablet Take 2 tablets (1,000 mg total) by mouth 2 (two) times daily. 120 tablet 2   oxyCODONE (OXY IR/ROXICODONE) 5 MG immediate release tablet Take 5 mg by mouth every 6 (six) hours as needed for moderate pain or severe pain.     sodium hypochlorite (DAKIN'S 1/2 STRENGTH) external solution Use as directed for wet to dry dressings daily to wound. 473 mL 2   sulfamethoxazole-trimethoprim (BACTRIM DS) 800-160 MG tablet Take 1 tablet by mouth 2 (two) times daily for 14 days. 28 tablet 0   TRUEplus Lancets 28G MISC Use to check blood sugar twice a day. 100 each 2   No current facility-administered medications on file prior to visit.    Objective:   Vitals:   12/11/22 1553  BP: 110/74  Pulse: 80  Resp: 16  SpO2: 100%    Comprehensive ROS Pertinent positive and negative noted in HPI   Exam General appearance : Awake, alert, not in any distress. Speech Clear. Not toxic looking HEENT: Atraumatic and Normocephalic, pupils equally reactive to light and accomodation  Neck: Supple, no JVD. No cervical lymphadenopathy.  Chest: Good air entry bilaterally, no added sounds  CVS: S1 S2 regular, no murmurs.  Abdomen: Bowel sounds present, Non tender and not distended with no gaurding, rigidity or rebound. Extremities: B/L Lower Ext shows no edema, both legs are warm to touch Neurology: Awake alert, and oriented X 3, CN II-XII intact, Non focal Skin: No Rash  Data Review Lab Results  Component Value Date   HGBA1C 11.1 (A) 12/11/2022   HGBA1C 8.8 (A) 02/13/2022   HGBA1C 8.8 (H) 10/29/2021    Assessment & Plan   [name] was seen today for diabetes.  Diagnoses and all orders for this visit:  Type 2 diabetes mellitus with  hyperglycemia, unspecified whether long term insulin use (HCC) -     POCT glycosylated hemoglobin (Hb A1C) 11.1   Complications from uncontrolled diabetes -diabetic retinopathy leading to blindness, diabetic nephropathy leading to dialysis, decrease in circulation decrease in sores or wound healing which may lead to amputations and increase of heart attack and stroke  Very disappointed last visit was getting better with ray amputation and discussed changing negativity to positively with the things she is able to due return   Patient have been counseled extensively about nutrition and exercise. Other issues discussed during this visit include: low cholesterol diet, weight control and daily exercise, foot care, annual eye examinations at Ophthalmology, importance of adherence with medications and regular follow-up. We also discussed long term complications of uncontrolled diabetes and hypertension.   No follow-ups on file.  The patient was given clear instructions to go to ER or return to medical center if symptoms don't improve, worsen or new problems develop. The patient verbalized understanding. The patient was told to call to get lab results if they haven't heard anything in the next week.   This note has been created with Education officer, environmental. Any transcriptional errors are unintentional.   Grayce Sessions, NP 12/11/2022, 4:09 PM

## 2022-12-19 ENCOUNTER — Other Ambulatory Visit: Payer: Self-pay

## 2022-12-19 ENCOUNTER — Encounter (HOSPITAL_BASED_OUTPATIENT_CLINIC_OR_DEPARTMENT_OTHER): Payer: Medicaid Other | Attending: Internal Medicine | Admitting: Internal Medicine

## 2022-12-19 DIAGNOSIS — E1169 Type 2 diabetes mellitus with other specified complication: Secondary | ICD-10-CM | POA: Insufficient documentation

## 2022-12-19 DIAGNOSIS — L97512 Non-pressure chronic ulcer of other part of right foot with fat layer exposed: Secondary | ICD-10-CM | POA: Insufficient documentation

## 2022-12-19 DIAGNOSIS — Z7901 Long term (current) use of anticoagulants: Secondary | ICD-10-CM | POA: Diagnosis not present

## 2022-12-19 DIAGNOSIS — Z89431 Acquired absence of right foot: Secondary | ICD-10-CM | POA: Insufficient documentation

## 2022-12-19 DIAGNOSIS — E1165 Type 2 diabetes mellitus with hyperglycemia: Secondary | ICD-10-CM | POA: Insufficient documentation

## 2022-12-19 DIAGNOSIS — Z794 Long term (current) use of insulin: Secondary | ICD-10-CM | POA: Insufficient documentation

## 2022-12-19 DIAGNOSIS — Z86718 Personal history of other venous thrombosis and embolism: Secondary | ICD-10-CM | POA: Diagnosis not present

## 2022-12-19 DIAGNOSIS — M86671 Other chronic osteomyelitis, right ankle and foot: Secondary | ICD-10-CM | POA: Insufficient documentation

## 2022-12-19 DIAGNOSIS — E11621 Type 2 diabetes mellitus with foot ulcer: Secondary | ICD-10-CM | POA: Diagnosis not present

## 2022-12-20 ENCOUNTER — Telehealth: Payer: Self-pay | Admitting: Primary Care

## 2022-12-20 NOTE — Progress Notes (Signed)
[name], [name] ([id number]) [id number] Page 1 of 10 Visit Report for 12/19/2022 Debridement Details Patient Name: Date of Service: [name], [place] [name] 12/19/2022 12:45 PM Medical Record Number: [id number] Patient Account Number: [id number] Date of Birth/Sex: Treating RN: 14-Jan-1981 (42 y.o. F) Primary Care Provider: Gwinda Passe Other Clinician: Referring Provider: Treating Provider/Extender: Aurelio Brash in Treatment: 19 Debridement Performed for Assessment: Wound #4 Right Amputation Site - Transmetatarsal Performed By: Physician Maxwell Caul., MD The following information was scribed by: Samuella Bruin The information was scribed for: Baltazar Najjar Debridement Type: Debridement Severity of Tissue Pre Debridement: Fat layer exposed Level of Consciousness (Pre-procedure): Awake and Alert Pre-procedure Verification/Time Out Yes - 13:10 Taken: Start Time: 13:10 Pain Control: Lidocaine 5% topical ointment Percent of Wound Bed Debrided: 100% T Area Debrided (cm): otal 1.18 Tissue and other material debrided: Non-Viable, Callus, Subcutaneous, Skin: Epidermis Level: Skin/Subcutaneous Tissue Debridement Description: Excisional Instrument: Curette Bleeding: Minimum Hemostasis Achieved: Pressure Response to Treatment: Procedure was tolerated well Level of Consciousness (Post- Awake and Alert procedure): Post Debridement Measurements of Total Wound Length: (cm) 1.5 Width: (cm) 1 Depth: (cm) 1.4 Volume: (cm) 1.649 Character of Wound/Ulcer Post Debridement: Improved Severity of Tissue Post Debridement: Fat layer exposed Post Procedure Diagnosis Same as Pre-procedure Electronic Signature(s) Signed: 12/19/2022 5:41:20 PM By: Baltazar Najjar MD Entered By: Baltazar Najjar on 12/19/2022 10:50:25 -------------------------------------------------------------------------------- HPI Details Patient Name: Date of  Service: [name] [name], [name] 12/19/2022 12:45 PM Medical Record Number: [id number] Patient Account Number: [id number] Date of Birth/Sex: Treating RN: [birth year] (42 y.o. F) Primary Care Provider: Gwinda Passe Other Clinician: Referring Provider: Treating Provider/Extender: Aurelio Brash in Treatment: 19 History of Present Illness [name] ([id number]) [id number] Page 2 of 10 HPI Description: Admission 01/22/2021 [name] is a 42 year old female with a past medical history of insulin-dependent uncontrolled type 2 diabetes with last hemoglobin A1c of 13.5, osteomyelitis of the right foot status post transmetatarsal amputation on 12/18/2020 that presents to the clinic for right foot wound. She has had dehiscence of the surgical site. She is currently using wet-to-dry dressings. She has a PICC line and receiving IV ceftriaxone daily for her osteomyelitis. There is an end date of 01/27/2021. She is also taking oral metronidazole. She currently denies systemic signs of infection. 1/19; patient presents for follow-up. She was diagnosed with a DVT to the right lower extremity 2 days ago. She is on Eliquis now. She is scheduled to see her infectious disease doctor tomorrow. She has been using Dakin's wet-to-dry dressings. She denies systemic signs of infection. 1/26; patient presents for follow-up. She saw infectious disease on 1/21 started on Augmentin. Her PICC line and IV ceftriaxone was discontinued. Patient reports stability to her wound. She has been using Dakin's wet-to-dry dressings. She currently denies systemic signs of infection. 2/3; patient presents for follow-up. She continues to use Dakin's wet-to-dry dressings to the wound bed. She saw Dr. Manson Passey with infectious disease yesterday and is continuing Augmentin. T entative end date is 2/16. Patient reports following up with orthopedics. She states there is no further plan  from them. She currently denies systemic signs of infection. 2/10; patient presents for follow-up. She continues to use Dakin's wet to dry dressings. She is scheduled to have her MRI done on 2/14. She states that she had pain to the debridement site from last clinic visit and declines debridement today. She denies systemic signs of infection. She continues to have yellow thick drainage.  2/20; patient presents for follow-up. She continues to use Dakin's wet-to-dry dressings. She obtained her MRI. The results showed an abscess and she is scheduled to see her orthopedic surgeon on 2/23. She saw infectious disease 2/17 and her antibiotics were extended. She currently denies systemic signs of infection. 3/6; patient presents for follow-up. She had debridement and irrigation of her foot on 03/10/2021 due to abscess noted on MRI. She was started on IV ceftriaxone and oral Flagyl. She has no issues or complaints today. She has been using iodoform packing to the tunnel and Dakin's wet-to-dry to the opening. 03/26/2021: She continues on IV ceftriaxone and oral metronidazole. She has follow-up with infectious disease tomorrow. No significant issues or complaints today. Her mother continues to help her with her wound dressing, using iodoform packing strips into the tunnel and Dakin's to the open portion of the wound. 3/20; patient presents for follow-up. She continues to be on IV ceftriaxone in oral metronidazole. She has been using iodoform to the tunnel and Dakin's wet-to- dry to the open wound. She denies signs of infection. 3/27; patient presents for follow-up. She no longer has a PICC line. She has been using iodoform to the tunnel and Dakin's wet-to-dry to the open wound. She reports improvement in wound healing. She denies signs of infection. 4/3; patient presents for follow-up. She states she has been using Hydrofera Blue to the open wound and iodoform packing to the tunnel without any issues. She denies  signs of infection. 4/18; patient presents for follow-up. She saw infectious disease on 4/11. She has finished her oral antibiotics and completed a total of 6 weeks of antibiotics (this includes IV as well). No further antibiotics needed. She has been using Hydrofera Blue and iodoform packing. She states that the tunneled area has come in and the iodoform is not staying in place anymore. She has no issues or complaints today. She denies signs of infection. 4/24; patient presents for follow-up. She saw Dr. Carlene Coria, plastic surgery to discuss potential skin graft/substitute placement. At this time he thinks that the skin graft would likely not take. He is in agreement with trying a wound VAC. Patient has been using Hydrofera Blue dressing changes with no issues. She denies signs of infection. She reports improvement in wound healing. 5/1; patient presents for follow-up. Unfortunately patient did not have insurance when we ran for the pico. There is an assistance program and we are trying to get this accommodated for the patient. In the meantime she has been using Hydrofera Blue without any issues. She denies signs of infection. 5/8; patient presents for follow-up. We have not heard back if pico is covered by her insurance. She has been using collagen to the wound bed over the past week. She denies signs of infection. 5/18; patient presents for follow-up. She has been using collagen to the wound bed without issues. Again we have not heard if pico is covered by her insurance. She has no issues or complaints today. 5/23; patient presents for follow-up. She has been using collagen to the wound bed. She has no issues or complaints today. She obtained the wound VAC from [facility] and brought this in today. She denies signs of infection. 6/1; patient presents for follow-up. She has been using the wound VAC for the past week. She has had this changed twice since she was last here. She reports more maceration to the  periwound. She denies signs of infection. 6/7; right TMA site. There are 2 wounds 0 separated by a  bridge of healed tissue. The more lateral area has undermining. Both areas have healthy looking granulation at the base but relative the size of the wound is fairly deep. There is no exposed bone no evidence of infection. Her wound VAC was put on hold last week because of surrounding skin maceration she has been using collagen this week. She has a modified shoe 6/12; patient presents for follow-up. Last week the wound VAC was reinitiated. She had no issues with the wound VAC itself. Today she has maceration again noted to the surrounding skin. She denies signs of infection. 6/27; patient presents for follow-up. She has been using Medihoney to the wound bed. We took a break from the wound VAC because the periwound was macerated. She still has some areas of maceration to the distal foot where there is a callus. She currently denies signs of infection. 7/11; patient presents for follow-up. She has been using Medihoney to the wound bed. She has developed some increased warmth and erythema to the lateral aspect of the right foot. She states this is occurred over the past week and there is increased pain. No drainage noted. 7/17; patient presents for follow-up. She has been using Medihoney to the wound bed. She completed her course of Bactrim. She reports improvement in symptoms. 7/24; Patient presents for follow up. She has been using Medihoney to the wound bed without issues. She completed another course of Bactrim. She reports improvement in her symptoms but still has some mild tenderness to the medial aspect of the foot. 7/31; Patient presents for follow-up. She has been using Medihoney and Dakin's to the wound bed. She denies signs of infection. 8/14; patient presents for follow-up. She has been using Dakin's wet-to-dry packing strips to the right medial aspect of the amputation site and Medihoney to the  anterior site. She has no issues or complaints today. She has started physical therapy. She denies signs of infection. 8/29; patient presents for follow-up. She has been using Dakin's wet-to-dry packing strips to the right medial aspect of the amputation site however this is becoming more difficult to place. She did report that she had increased redness and swelling to that site and developed some drainage. It has resolved. She continues with physical therapy. TRINIECE, ZAJICEK ([id number]) [id number] Page 3 of 10 9/8; patient presents for follow-up. We have been using silver alginate to the tunneled area. She completed her course of antibiotics. She reports no pain, increased swelling or erythema. 9/21; patient presents for follow-up. She has been using gentamicin to the tunneled area. She has no issues or complaints today. She denies signs of infection. 10/2; patient presents for follow-up. She is scheduled to have her CT scan on 10/9. She currently denies signs of infection. She denies increased warmth, erythema or purulent drainage from the wound bed. She has been using Dakin's wet-to-dry dressings. 10/12; patient presents for follow-up. She had her CT scan on 10/9 that showed A small irregular rim-enhancing fluid pocket communicating to the overlying soft tissues of the sinus tract compatible with a small abscess. Currently she denies systemic signs of infection. She has been doing Dakin's wet-to-dry packing strips but it is hard for her to pack into the narrow opening. 10/30; patient presents for follow-up. Since last clinic visit she has had OR debridement of her Right foot by Dr. Odis Hollingshead Due to abscess noted on CT. She has been using iodoform packing. 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 She will complete  2 weeks of this and continue with oral amoxicillin for the next 6 to 12 months. She follows with Dr. Luciana Axe for this.  She currently denies signs of infection. 11/13; patient presents for follow-up. Patient has been using silver alginate with gentamicin to the wound bed. She has no issues or complaints today she. She reports improvement in wound healing. 12/4; patient presents for follow-up. She has been using silver alginate and gentamicin to the wound bed. She has no issues or complaints today. 12/8; patient presents for follow-up. She has been using silver alginate with gentamicin to the wound bed. She presents for her first cast placement. She will be back early next week for her obligatory cast change. 12/12; patient presents for follow-up. We have been using collagen with antibiotic ointment to the wound bed under a total contact cast. She has tolerated this well. She is improved in wound healing. 12/18; patient presents for follow-up. We have been using collagen with antibiotic ointment under the total contact cast. She has no complaints today. 1/2; The cast was placed at last clinic visit however patient missed her follow-up and this was taken off last week at a nurse visit. Patient has been using packing strips to the medial narrowed wound. She has noted no drainage. 1/22; patient presents for follow-up. She has been keeping the area covered. She reports no drainage. She has had no issues to the previous wound site. She denies any signs of infection including increased warmth, erythema or purulent drainage. 08/08/2022 [name] is a 42 year old female with a past medical history of insulin-dependent uncontrolled type 2 diabetes with last hemoglobin A1c of 8.8, chronic osteomyelitis of the right foot status post transmetatarsal amputation on 12/18/2020 that has been previously treated for surgical dehiscence of this site in our clinic. She presents today 6 months after discharge from our clinic due to healed right foot wound with now reopening of the previous surgical site. She reports chronic pain to  the site however denies purulent drainage, increased warmth or erythema. She noticed the wound opening about 1 month ago. She has been doing physical therapy without significant issues. She has inserts to her shoe however she has been advised to follow-up with [employer] for new custom inserts by her orthopedic surgeon now that she has a new wound. She states she will try and do this. She has been using antibiotic ointment to the wound bed. 8/1; patient presents for follow-up. She has been taking her antibiotics without issues. She has been using Dakin's wet-to-dry dressings to the wound bed. The wound is smaller. She denies systemic signs of infection. 8/13; patient presents for follow-up. She had ABIs completed that were normal. On the right it was 1.02 and on the left was 0.99. She has been using silver alginate to the wound bed with gentamicin ointment. She states it is hard to pack the wound bed. She currently denies signs of infection. 8/29; patient presents for follow-up. She has been using collagen with antibiotic ointment to the wound bed. Wound is stable. She denies signs of infection. 9/5; patient presents for follow-up. She has been using silver alginate to the wound bed. Wound is slightly wider. She denies signs of infection. 9/12; patient presents for follow-up. She has been using silver alginate to the wound bed. Wound is slightly smaller today. 9/19; right foot in the setting of a TMA over the first metatarsal head. She has thick callus around this area small fissured wound which actually looks like it is  attempting to close. She has a offloading shoe I am not sure how much activity she is in. I did not have a lot of time to see her today because of transportation 9/26; right foot in the setting of a previous TMA. The wound is over the plantar first metatarsal head. This is a fissured wound with thick callus around the margins. It is small in terms of overall surface area but has a depth  of about 0.6 cm there was no palpable bone. She has a history of chronic osteomyelitis although she has been treated for this I have reviewed CT scans from 2023 also an MRI from 10/26/2021. Neither imaging study showed osteomyelitis in this area although the CT scan suggested osteomyelitis in the fourth metatarsal head. There has been problems with abscesses communicating with the wound although there does not appear to be purulent drainage currently 10/3; right foot at the TMA site. Small probing wound. I changed her to Iodosorb with gauze last time. May be some improvement. She has a depth of about 0.6 cm but there is no palpable bone. She has been previously treated for chronic osteomyelitis in this area. 10/17; this is a patient with a probing wound at the right TMA site. Underlying osteomyelitis previously treated earlier this year. I been using Iodosorb to try and help constrict this wound however today she comes in with a larger circumference a larger tunnel and this probably sits right on top of bone. Also worrisome is there was a streaking dark area just caudal to the wound on the dorsal surface of the foot. The patient is complaining of more pain but no fever. 10/24; culture of this area [bone scraping] revealed Acinetobacter and 2 coag negative staph. I am going to give her Septra DS 1 p.o. twice daily for 2 weeks. I think it would be simplistic to call this a bone culture but it certainly was a deep culture. An x-ray was done but that has not been reported yet. I had wanted to get lab work including a CBC with differential ESR and C-reactive protein but I am not actually sure that got done last week. We have been using gent and silver alginate. 11/7; patient is still taking trimethoprim/sulfamethoxazole for the assessment of bacterial identified last week. Lab work revealed a C-reactive protein of 4.1 which is elevated from 1.7 but not dramatic. White count was normal at 8.4 x-ray of the  right foot revealed minimal new lucency and cortical erosion within the adjacent distal plantar aspect of the metatarsals concerning for acute osteomyelitis early. 11/19; the patient has completed her Septra DS which I gave to her for 2 weeks. She has her MRI tomorrow but then we will have the obligatory 5 to 10- day/workday wait to get a report. I am going to resume her Septra DS for a further 2 weeks. Then decide on additional antibiotics, infectious disease etc. 12/5; the patient has completed the Septra DS for the originally cultured as Enterobacter on a bone scraping. Her MRI somewhat surprisingly showed status post amputation of the 1st through 5th metatarsal base but no evidence of osteomyelitis. Surrounding skin thickening with mild edema could represent cellulitis. Previous x-ray showed the suggestion of bone erosion although this was not verified by her MRI. LATRESIA, BODMAN ([id number]) [id number] Page 4 of 10 Electronic Signature(s) Signed: 12/19/2022 5:41:20 PM By: Baltazar Najjar MD Entered By: Baltazar Najjar on 12/19/2022 10:52:03 -------------------------------------------------------------------------------- Physical Exam Details Patient Name: Date of Service: [name] V IS, [place]  [name] 12/19/2022 12:45 PM Medical Record Number: [id number] Patient Account Number: [id number] Date of Birth/Sex: Treating RN: 07-05-1980 (42 y.o. F) Primary Care Provider: Gwinda Passe Other Clinician: Referring Provider: Treating Provider/Extender: Aurelio Brash in Treatment: 19 Constitutional Sitting or standing Blood Pressure is within target range for patient.. Pulse regular and within target range for patient.Marland Kitchen Respirations regular, non-labored and within target range.. Temperature is normal and within the target range for the patient.Marland Kitchen Appears in no distress. Notes Wound exam; over the plantar first metatarsal previous TMA. This probes deeply  but does not go to bone. No evidence of surrounding infection however there is thick circumferential callus around this wound. I used a #5 curette to debride this some underlying skin.Hemostasis with direct pressure Electronic Signature(s) Signed: 12/19/2022 5:41:20 PM By: Baltazar Najjar MD Entered By: Baltazar Najjar on 12/19/2022 10:53:15 -------------------------------------------------------------------------------- Physician Orders Details Patient Name: Date of Service: [name] V IS, [name] 12/19/2022 12:45 PM Medical Record Number: [id number] Patient Account Number: [id number] Date of Birth/Sex: Treating RN: [birth year] (42 y.o. Fredderick Phenix Primary Care Provider: Gwinda Passe Other Clinician: Referring Provider: Treating Provider/Extender: Aurelio Brash in Treatment: 19 The following information was scribed by: Samuella Bruin The information was scribed for: Baltazar Najjar Verbal / Phone Orders: No Diagnosis Coding Follow-up Appointments ppointment in 1 week. - Dr. Leanord Hawking Return A Anesthetic (In clinic) Topical Lidocaine 5% applied to wound bed Bathing/ Shower/ Hygiene May shower with protection but do not get wound dressing(s) wet. Protect dressing(s) with water repellant cover (for example, large plastic bag) or a cast cover and may then take shower. Other Bathing/Shower/Hygiene Orders/Instructions: - When changing the dressing (every other day) you may get right foot wet. On days NOT changing the dressing.Keep wound/dressing dry Off-Loading Total Contact Cast to Right Lower Extremity Removable cast walker boot to: - right leg Wound Treatment Wound #4 - Amputation Site - Transmetatarsal Wound Laterality: Right MEKAILA, CRAIL ([id number]) [id number] Page 5 of 10 Cleanser: Soap and Water 1 x Per Week/30 Days Discharge Instructions: May shower and wash wound with dial antibacterial soap and water prior to  dressing change. On days not changing the dressing, please keep wound/dressing dry Topical: Gentamicin 1 x Per Week/30 Days Discharge Instructions: As directed by physician Topical: Skintegrity Hydrogel 4 (oz) 1 x Per Week/30 Days Discharge Instructions: Apply hydrogel as directed Prim Dressing: Promogran Prisma Matrix, 4.34 (sq in) (silver collagen) 1 x Per Week/30 Days ary Discharge Instructions: Moisten collagen with saline or hydrogel Secondary Dressing: Optifoam Non-Adhesive Dressing, 4x4 in 1 x Per Week/30 Days Discharge Instructions: Apply over primary dressing as directed. Secondary Dressing: Woven Gauze Sponge, Non-Sterile 4x4 in 1 x Per Week/30 Days Discharge Instructions: Apply over primary dressing as directed. Secondary Dressing: Zetuvit Plus 4x8 in 1 x Per Week/30 Days Discharge Instructions: Apply over primary dressing as directed. Secured With: [employer], 4.5x3.1 (in/yd) 1 x Per Week/30 Days Discharge Instructions: Secure with Kerlix as directed. Secured With: 58M Medipore H Soft Cloth Surgical T ape, 4 x 10 (in/yd) 1 x Per Week/30 Days Discharge Instructions: Secure with tape as directed. Patient Medications llergies: No Known Drug Allergies A Notifications Medication Indication Start End 12/19/2022 lidocaine DOSE topical 5 % ointment - ointment topical Electronic Signature(s) Signed: 12/19/2022 4:50:46 PM By: Samuella Bruin Signed: 12/19/2022 5:41:20 PM By: Baltazar Najjar MD Entered By: Samuella Bruin on 12/19/2022 10:19:38 -------------------------------------------------------------------------------- Problem List Details Patient Name: Date of Service: [name] V IS, [name]  12/19/2022 12:45 PM Medical Record Number: [id number] Patient Account Number: [id number] Date of Birth/Sex: Treating RN: 1980/10/27 (42 y.o. F) Primary Care Provider: Gwinda Passe Other Clinician: Referring Provider: Treating Provider/Extender: Aurelio Brash in Treatment: 19 Active Problems ICD-10 Encounter Code Description Active Date MDM Diagnosis L97.512 Non-pressure chronic ulcer of other part of right foot with fat layer exposed 08/08/2022 No Yes E11.621 Type 2 diabetes mellitus with foot ulcer 08/08/2022 No Yes M86.671 Other chronic osteomyelitis, right ankle and foot 08/08/2022 No Yes Ehler, Sharline L ([id number]) [id number] Page 6 of 10 Inactive Problems Resolved Problems Electronic Signature(s) Signed: 12/19/2022 5:41:20 PM By: Baltazar Najjar MD Entered By: Baltazar Najjar on 12/19/2022 10:49:57 -------------------------------------------------------------------------------- Progress Note Details Patient Name: Date of Service: [name] [name], [name] 12/19/2022 12:45 PM Medical Record Number: [id number] Patient Account Number: [id number] Date of Birth/Sex: Treating RN: May 11, 1980 (42 y.o. F) Primary Care Provider: Gwinda Passe Other Clinician: Referring Provider: Treating Provider/Extender: Aurelio Brash in Treatment: 19 Subjective History of Present Illness (HPI) Admission 01/22/2021 Ms. Aubrii Birky is a 42 year old female with a past medical history of insulin-dependent uncontrolled type 2 diabetes with last hemoglobin A1c of 13.5, osteomyelitis of the right foot status post transmetatarsal amputation on 12/18/2020 that presents to the clinic for right foot wound. She has had dehiscence of the surgical site. She is currently using wet-to-dry dressings. She has a PICC line and receiving IV ceftriaxone daily for her osteomyelitis. There is an end date of 01/27/2021. She is also taking oral metronidazole. She currently denies systemic signs of infection. 1/19; patient presents for follow-up. She was diagnosed with a DVT to the right lower extremity 2 days ago. She is on Eliquis now. She is scheduled to see her infectious disease doctor tomorrow. She has been using  Dakin's wet-to-dry dressings. She denies systemic signs of infection. 1/26; patient presents for follow-up. She saw infectious disease on 1/21 started on Augmentin. Her PICC line and IV ceftriaxone was discontinued. Patient reports stability to her wound. She has been using Dakin's wet-to-dry dressings. She currently denies systemic signs of infection. 2/3; patient presents for follow-up. She continues to use Dakin's wet-to-dry dressings to the wound bed. She saw Dr. Manson Passey with infectious disease yesterday and is continuing Augmentin. T entative end date is 2/16. Patient reports following up with orthopedics. She states there is no further plan from them. She currently denies systemic signs of infection. 2/10; patient presents for follow-up. She continues to use Dakin's wet to dry dressings. She is scheduled to have her MRI done on 2/14. She states that she had pain to the debridement site from last clinic visit and declines debridement today. She denies systemic signs of infection. She continues to have yellow thick drainage. 2/20; patient presents for follow-up. She continues to use Dakin's wet-to-dry dressings. She obtained her MRI. The results showed an abscess and she is scheduled to see her orthopedic surgeon on 2/23. She saw infectious disease 2/17 and her antibiotics were extended. She currently denies systemic signs of infection. 3/6; patient presents for follow-up. She had debridement and irrigation of her foot on 03/10/2021 due to abscess noted on MRI. She was started on IV ceftriaxone and oral Flagyl. She has no issues or complaints today. She has been using iodoform packing to the tunnel and Dakin's wet-to-dry to the opening. 03/26/2021: She continues on IV ceftriaxone and oral metronidazole. She has follow-up with infectious disease tomorrow. No significant issues or complaints today. Her mother continues to  help her with her wound dressing, using iodoform packing strips into the tunnel  and Dakin's to the open portion of the wound. 3/20; patient presents for follow-up. She continues to be on IV ceftriaxone in oral metronidazole. She has been using iodoform to the tunnel and Dakin's wet-to- dry to the open wound. She denies signs of infection. 3/27; patient presents for follow-up. She no longer has a PICC line. She has been using iodoform to the tunnel and Dakin's wet-to-dry to the open wound. She reports improvement in wound healing. She denies signs of infection. 4/3; patient presents for follow-up. She states she has been using Hydrofera Blue to the open wound and iodoform packing to the tunnel without any issues. She denies signs of infection. 4/18; patient presents for follow-up. She saw infectious disease on 4/11. She has finished her oral antibiotics and completed a total of 6 weeks of antibiotics (this includes IV as well). No further antibiotics needed. She has been using Hydrofera Blue and iodoform packing. She states that the tunneled area has come in and the iodoform is not staying in place anymore. She has no issues or complaints today. She denies signs of infection. 4/24; patient presents for follow-up. She saw Dr. Carlene Coria, plastic surgery to discuss potential skin graft/substitute placement. At this time he thinks that the skin graft would likely not take. He is in agreement with trying a wound VAC. Patient has been using Hydrofera Blue dressing changes with no issues. She denies signs of infection. She reports improvement in wound healing. 5/1; patient presents for follow-up. Unfortunately patient did not have insurance when we ran for the pico. There is an assistance program and we are trying to get this accommodated for the patient. In the meantime she has been using Hydrofera Blue without any issues. She denies signs of infection. 5/8; patient presents for follow-up. We have not heard back if pico is covered by her insurance. She has been using collagen to the wound  bed over the past week. She denies signs of infection. [name], [name] ([id number]) [id number] Page 7 of 10 5/18; patient presents for follow-up. She has been using collagen to the wound bed without issues. Again we have not heard if pico is covered by her insurance. She has no issues or complaints today. 5/23; patient presents for follow-up. She has been using collagen to the wound bed. She has no issues or complaints today. She obtained the wound VAC from [facility] and brought this in today. She denies signs of infection. 6/1; patient presents for follow-up. She has been using the wound VAC for the past week. She has had this changed twice since she was last here. She reports more maceration to the periwound. She denies signs of infection. 6/7; right TMA site. There are 2 wounds 0 separated by a bridge of healed tissue. The more lateral area has undermining. Both areas have healthy looking granulation at the base but relative the size of the wound is fairly deep. There is no exposed bone no evidence of infection. Her wound VAC was put on hold last week because of surrounding skin maceration she has been using collagen this week. She has a modified shoe 6/12; patient presents for follow-up. Last week the wound VAC was reinitiated. She had no issues with the wound VAC itself. Today she has maceration again noted to the surrounding skin. She denies signs of infection. 6/27; patient presents for follow-up. She has been using Medihoney to the wound bed. We took a break  from the wound VAC because the periwound was macerated. She still has some areas of maceration to the distal foot where there is a callus. She currently denies signs of infection. 7/11; patient presents for follow-up. She has been using Medihoney to the wound bed. She has developed some increased warmth and erythema to the lateral aspect of the right foot. She states this is occurred over the past week and there is  increased pain. No drainage noted. 7/17; patient presents for follow-up. She has been using Medihoney to the wound bed. She completed her course of Bactrim. She reports improvement in symptoms. 7/24; Patient presents for follow up. She has been using Medihoney to the wound bed without issues. She completed another course of Bactrim. She reports improvement in her symptoms but still has some mild tenderness to the medial aspect of the foot. 7/31; Patient presents for follow-up. She has been using Medihoney and Dakin's to the wound bed. She denies signs of infection. 8/14; patient presents for follow-up. She has been using Dakin's wet-to-dry packing strips to the right medial aspect of the amputation site and Medihoney to the anterior site. She has no issues or complaints today. She has started physical therapy. She denies signs of infection. 8/29; patient presents for follow-up. She has been using Dakin's wet-to-dry packing strips to the right medial aspect of the amputation site however this is becoming more difficult to place. She did report that she had increased redness and swelling to that site and developed some drainage. It has resolved. She continues with physical therapy. 9/8; patient presents for follow-up. We have been using silver alginate to the tunneled area. She completed her course of antibiotics. She reports no pain, increased swelling or erythema. 9/21; patient presents for follow-up. She has been using gentamicin to the tunneled area. She has no issues or complaints today. She denies signs of infection. 10/2; patient presents for follow-up. She is scheduled to have her CT scan on 10/9. She currently denies signs of infection. She denies increased warmth, erythema or purulent drainage from the wound bed. She has been using Dakin's wet-to-dry dressings. 10/12; patient presents for follow-up. She had her CT scan on 10/9 that showed A small irregular rim-enhancing fluid pocket  communicating to the overlying soft tissues of the sinus tract compatible with a small abscess. Currently she denies systemic signs of infection. She has been doing Dakin's wet-to-dry packing strips but it is hard for her to pack into the narrow opening. 10/30; patient presents for follow-up. Since last clinic visit she has had OR debridement of her Right foot by Dr. Odis Hollingshead Due to abscess noted on CT. She has been using iodoform packing. 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 She will complete 2 weeks of this and continue with oral amoxicillin for the next 6 to 12 months. She follows with Dr. Luciana Axe for this. She currently denies signs of infection. 11/13; patient presents for follow-up. Patient has been using silver alginate with gentamicin to the wound bed. She has no issues or complaints today she. She reports improvement in wound healing. 12/4; patient presents for follow-up. She has been using silver alginate and gentamicin to the wound bed. She has no issues or complaints today. 12/8; patient presents for follow-up. She has been using silver alginate with gentamicin to the wound bed. She presents for her first cast placement. She will be back early next week for her obligatory cast change. 12/12; patient presents for follow-up. We  have been using collagen with antibiotic ointment to the wound bed under a total contact cast. She has tolerated this well. She is improved in wound healing. 12/18; patient presents for follow-up. We have been using collagen with antibiotic ointment under the total contact cast. She has no complaints today. 1/2; The cast was placed at last clinic visit however patient missed her follow-up and this was taken off last week at a nurse visit. Patient has been using packing strips to the medial narrowed wound. She has noted no drainage. 1/22; patient presents for follow-up. She has been keeping the area covered. She reports no  drainage. She has had no issues to the previous wound site. She denies any signs of infection including increased warmth, erythema or purulent drainage. 08/08/2022 [name] is a 42 year old female with a past medical history of insulin-dependent uncontrolled type 2 diabetes with last hemoglobin A1c of 8.8, chronic osteomyelitis of the right foot status post transmetatarsal amputation on 12/18/2020 that has been previously treated for surgical dehiscence of this site in our clinic. She presents today 6 months after discharge from our clinic due to healed right foot wound with now reopening of the previous surgical site. She reports chronic pain to the site however denies purulent drainage, increased warmth or erythema. She noticed the wound opening about 1 month ago. She has been doing physical therapy without significant issues. She has inserts to her shoe however she has been advised to follow-up with [employer] for new custom inserts by her orthopedic surgeon now that she has a new wound. She states she will try and do this. She has been using antibiotic ointment to the wound bed. 8/1; patient presents for follow-up. She has been taking her antibiotics without issues. She has been using Dakin's wet-to-dry dressings to the wound bed. The wound is smaller. She denies systemic signs of infection. 8/13; patient presents for follow-up. She had ABIs completed that were normal. On the right it was 1.02 and on the left was 0.99. She has been using silver alginate to the wound bed with gentamicin ointment. She states it is hard to pack the wound bed. She currently denies signs of infection. 8/29; patient presents for follow-up. She has been using collagen with antibiotic ointment to the wound bed. Wound is stable. She denies signs of infection. 9/5; patient presents for follow-up. She has been using silver alginate to the wound bed. Wound is slightly wider. She denies signs of infection. [name], [name]  ([id number]) [id number] Page 8 of 10 9/12; patient presents for follow-up. She has been using silver alginate to the wound bed. Wound is slightly smaller today. 9/19; right foot in the setting of a TMA over the first metatarsal head. She has thick callus around this area small fissured wound which actually looks like it is attempting to close. She has a offloading shoe I am not sure how much activity she is in. I did not have a lot of time to see her today because of transportation 9/26; right foot in the setting of a previous TMA. The wound is over the plantar first metatarsal head. This is a fissured wound with thick callus around the margins. It is small in terms of overall surface area but has a depth of about 0.6 cm there was no palpable bone. She has a history of chronic osteomyelitis although she has been treated for this I have reviewed CT scans from 2023 also an MRI from 10/26/2021. Neither imaging study showed osteomyelitis in  this area although the CT scan suggested osteomyelitis in the fourth metatarsal head. There has been problems with abscesses communicating with the wound although there does not appear to be purulent drainage currently 10/3; right foot at the TMA site. Small probing wound. I changed her to Iodosorb with gauze last time. May be some improvement. She has a depth of about 0.6 cm but there is no palpable bone. She has been previously treated for chronic osteomyelitis in this area. 10/17; this is a patient with a probing wound at the right TMA site. Underlying osteomyelitis previously treated earlier this year. I been using Iodosorb to try and help constrict this wound however today she comes in with a larger circumference a larger tunnel and this probably sits right on top of bone. Also worrisome is there was a streaking dark area just caudal to the wound on the dorsal surface of the foot. The patient is complaining of more pain but no  fever. 10/24; culture of this area [bone scraping] revealed Acinetobacter and 2 coag negative staph. I am going to give her Septra DS 1 p.o. twice daily for 2 weeks. I think it would be simplistic to call this a bone culture but it certainly was a deep culture. An x-ray was done but that has not been reported yet. I had wanted to get lab work including a CBC with differential ESR and C-reactive protein but I am not actually sure that got done last week. We have been using gent and silver alginate. 11/7; patient is still taking trimethoprim/sulfamethoxazole for the assessment of bacterial identified last week. Lab work revealed a C-reactive protein of 4.1 which is elevated from 1.7 but not dramatic. White count was normal at 8.4 x-ray of the right foot revealed minimal new lucency and cortical erosion within the adjacent distal plantar aspect of the metatarsals concerning for acute osteomyelitis early. 11/19; the patient has completed her Septra DS which I gave to her for 2 weeks. She has her MRI tomorrow but then we will have the obligatory 5 to 10- day/workday wait to get a report. I am going to resume her Septra DS for a further 2 weeks. Then decide on additional antibiotics, infectious disease etc. 12/5; the patient has completed the Septra DS for the originally cultured as Enterobacter on a bone scraping. Her MRI somewhat surprisingly showed status post amputation of the 1st through 5th metatarsal base but no evidence of osteomyelitis. Surrounding skin thickening with mild edema could represent cellulitis. Previous x-ray showed the suggestion of bone erosion although this was not verified by her MRI. Objective Constitutional Sitting or standing Blood Pressure is within target range for patient.. Pulse regular and within target range for patient.Marland Kitchen Respirations regular, non-labored and within target range.. Temperature is normal and within the target range for the patient.Marland Kitchen Appears in no  distress. Vitals Time Taken: 12:56 PM, Height: 69 in, Weight: 330 lbs, BMI: 48.7, Temperature: 97.7 F, Pulse: 90 bpm, Respiratory Rate: 16 breaths/min, Blood Pressure: 116/76 mmHg. General Notes: Wound exam; over the plantar first metatarsal previous TMA. This probes deeply but does not go to bone. No evidence of surrounding infection however there is thick circumferential callus around this wound. I used a #5 curette to debride this some underlying skin.Hemostasis with direct pressure Integumentary (Hair, Skin) Wound #4 status is Open. Original cause of wound was Shear/Friction. The date acquired was: 07/08/2022. The wound has been in treatment 19 weeks. The wound is located on the Right Amputation Site - Transmetatarsal.  The wound measures 1.5cm length x 1cm width x 1.4cm depth; 1.178cm^2 area and 1.649cm^3 volume. There is Fat Layer (Subcutaneous Tissue) exposed. There is no undermining noted, however, there is tunneling at 12:00 with a maximum distance of 1.1cm. There is a medium amount of serosanguineous drainage noted. The wound margin is distinct with the outline attached to the wound base. There is large (67-100%) red, pink granulation within the wound bed. There is a small (1-33%) amount of necrotic tissue within the wound bed including Adherent Slough. The periwound skin appearance had no abnormalities noted for moisture. The periwound skin appearance had no abnormalities noted for color. The periwound skin appearance exhibited: Callus. The periwound skin appearance did not exhibit: Crepitus, Excoriation, Induration, Rash, Scarring. Periwound temperature was noted as No Abnormality. The periwound has tenderness on palpation. Assessment Active Problems ICD-10 Non-pressure chronic ulcer of other part of right foot with fat layer exposed Type 2 diabetes mellitus with foot ulcer Other chronic osteomyelitis, right ankle and foot Procedures Swofford, Vidya L ([id number])  [id number] Page 9 of 10 Wound #4 Pre-procedure diagnosis of Wound #4 is a Diabetic Wound/Ulcer of the Lower Extremity located on the Right Amputation Site - Transmetatarsal .Severity of Tissue Pre Debridement is: Fat layer exposed. There was a Excisional Skin/Subcutaneous Tissue Debridement with a total area of 1.18 sq cm performed by Maxwell Caul., MD. With the following instrument(s): Curette to remove Non-Viable tissue/material. Material removed includes Callus, Subcutaneous Tissue, and Skin: Epidermis after achieving pain control using Lidocaine 5% topical ointment. No specimens were taken. A time out was conducted at 13:10, prior to the start of the procedure. A Minimum amount of bleeding was controlled with Pressure. The procedure was tolerated well. Post Debridement Measurements: 1.5cm length x 1cm width x 1.4cm depth; 1.649cm^3 volume. Character of Wound/Ulcer Post Debridement is improved. Severity of Tissue Post Debridement is: Fat layer exposed. Post procedure Diagnosis Wound #4: Same as Pre-Procedure Pre-procedure diagnosis of Wound #4 is a Diabetic Wound/Ulcer of the Lower Extremity located on the Right Amputation Site - Transmetatarsal . There was a T Contact Cast Procedure by Maxwell Caul., MD. otal Post procedure Diagnosis Wound #4: Same as Pre-Procedure Plan Follow-up Appointments: Return Appointment in 1 week. - Dr. Leanord Hawking Anesthetic: (In clinic) Topical Lidocaine 5% applied to wound bed Bathing/ Shower/ Hygiene: May shower with protection but do not get wound dressing(s) wet. Protect dressing(s) with water repellant cover (for example, large plastic bag) or a cast cover and may then take shower. Other Bathing/Shower/Hygiene Orders/Instructions: - When changing the dressing (every other day) you may get right foot wet. On days NOT changing the dressing.Keep wound/dressing dry Off-Loading: T Contact Cast to Right Lower  Extremity otal Removable cast walker boot to: - right leg The following medication(s) was prescribed: lidocaine topical 5 % ointment ointment topical was prescribed at facility WOUND #4: - Amputation Site - Transmetatarsal Wound Laterality: Right Cleanser: Soap and Water 1 x Per Week/30 Days Discharge Instructions: May shower and wash wound with dial antibacterial soap and water prior to dressing change. On days not changing the dressing, please keep wound/dressing dry Topical: Gentamicin 1 x Per Week/30 Days Discharge Instructions: As directed by physician Topical: Skintegrity Hydrogel 4 (oz) 1 x Per Week/30 Days Discharge Instructions: Apply hydrogel as directed Prim Dressing: Promogran Prisma Matrix, 4.34 (sq in) (silver collagen) 1 x Per Week/30 Days ary Discharge Instructions: Moisten collagen with saline or hydrogel Secondary Dressing: Optifoam Non-Adhesive Dressing, 4x4 in 1 x Per Week/30 Days Discharge  Instructions: Apply over primary dressing as directed. Secondary Dressing: Woven Gauze Sponge, Non-Sterile 4x4 in 1 x Per Week/30 Days Discharge Instructions: Apply over primary dressing as directed. Secondary Dressing: Zetuvit Plus 4x8 in 1 x Per Week/30 Days Discharge Instructions: Apply over primary dressing as directed. Secured With: [employer], 4.5x3.1 (in/yd) 1 x Per Week/30 Days Discharge Instructions: Secure with Kerlix as directed. Secured With: 27M Medipore H Soft Cloth Surgical T ape, 4 x 10 (in/yd) 1 x Per Week/30 Days Discharge Instructions: Secure with tape as directed. 1. I am going to continue with gentamicin. Packed this probing wound with Prisma hydrogel 2. The presence of thick callus suggest unrelieved pressure. We are going to put this in a total contact cast 3. She has completed 6 weeks of Septra for the bone scraping that showed an S Enterobacter. Surprisingly the MRI did not document bone destruction however perhaps this was too acute of an  osteomyelitis. Electronic Signature(s) Signed: 12/19/2022 5:41:20 PM By: Baltazar Najjar MD Entered By: Baltazar Najjar on 12/19/2022 10:55:31 -------------------------------------------------------------------------------- Total Contact Cast Details Patient Name: Date of Service: [name] Pereyra, [name] 12/19/2022 12:45 PM Medical Record Number: [id number] Patient Account Number: [id number] Date of Birth/Sex: Treating RN: 03-06-1980 (42 y.o. F) Primary Care Provider: Gwinda Passe Other Clinician: Referring Provider: Treating Provider/Extender: Khamille, Spickerman, [name] ([id number]) [id number] Page 10 of 10 Weeks in Treatment: 19 T Contact Cast Applied for Wound Assessment: otal Wound #4 Right Amputation Site - Transmetatarsal Performed By: Physician Maxwell Caul., MD The following information was scribed by: Samuella Bruin The information was scribed for: Baltazar Najjar Post Procedure Diagnosis Same as Pre-procedure Electronic Signature(s) Signed: 12/19/2022 5:41:20 PM By: Baltazar Najjar MD Entered By: Baltazar Najjar on 12/19/2022 10:50:15 -------------------------------------------------------------------------------- SuperBill Details Patient Name: Date of Service: [name] [name], [name] 12/19/2022 Medical Record Number: [id number] Patient Account Number: [id number] Date of Birth/Sex: Treating RN: 1980-11-14 (42 y.o. 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

## 2022-12-20 NOTE — Telephone Encounter (Signed)
Will forward to provider  

## 2022-12-20 NOTE — Progress Notes (Signed)
LORRY, WANT ([id number]) [id number] Page 1 of 8 Visit Report for 12/19/2022 Arrival Information Details Patient Name: Date of Service: [name], [place] [name] 12/19/2022 12:45 PM Medical Record Number: [id number] Patient Account Number: [id number] Date of Birth/Sex: Treating RN: Nov 10, 1980 (42 y.o. Fredderick Phenix Primary Care Claryssa Sandner: Gwinda Passe Other Clinician: Referring Arlen Legendre: Treating Sailor Haughn/Extender: Aurelio Brash in Treatment: 19 Visit Information History Since Last Visit Added or deleted any medications: No Patient Arrived: Wheel Chair Any new allergies or adverse reactions: No Arrival Time: 12:55 Had a fall or experienced change in No Accompanied By: self activities of daily living that may affect Transfer Assistance: Manual risk of falls: Patient Identification Verified: Yes Signs or symptoms of abuse/neglect since last visito No Secondary Verification Process Completed: Yes Hospitalized since last visit: No Patient Requires Transmission-Based Precautions: No Implantable device outside of the clinic excluding No Patient Has Alerts: Yes cellular tissue based products placed in the center Patient Alerts: Patient on Blood Thinner since last visit: Eliquis Has Dressing in Place as Prescribed: Yes ABI R 1.02 (08/21/22) Pain Present Now: Yes ABI L 0.99 (08/21/22) Electronic Signature(s) Signed: 12/19/2022 4:50:46 PM By: Samuella Bruin Entered By: Samuella Bruin on 12/19/2022 12:56:10 -------------------------------------------------------------------------------- Encounter Discharge Information Details Patient Name: Date of Service: [name] V IS, [name] 12/19/2022 12:45 PM Medical Record Number: [id number] Patient Account Number: [id number] Date of Birth/Sex: Treating RN: 11-24-1980 (42 y.o. Fredderick Phenix Primary Care Jameir Ake: Gwinda Passe Other Clinician: Referring Jacey Eckerson: Treating  Kyleigh Nannini/Extender: Aurelio Brash in Treatment: 19 Encounter Discharge Information Items Post Procedure Vitals Discharge Condition: Stable Temperature (F): 97.7 Ambulatory Status: Wheelchair Pulse (bpm): 90 Discharge Destination: Home Respiratory Rate (breaths/min): 16 Transportation: Private Auto Blood Pressure (mmHg): 116/76 Accompanied By: mother Schedule Follow-up Appointment: Yes Clinical Summary of Care: Patient Declined Electronic Signature(s) Signed: 12/19/2022 4:50:46 PM By: Samuella Bruin Entered By: Samuella Bruin on 12/19/2022 14:15:58 Fairbairn, Sharol Harness ([id number]) [id number] Page 2 of 8 -------------------------------------------------------------------------------- Lower Extremity Assessment Details Patient Name: Date of Service: [name] Pereyra, [name] 12/19/2022 12:45 PM Medical Record Number: [id number] Patient Account Number: [id number] Date of Birth/Sex: Treating RN: 14-Jun-1980 (42 y.o. Fredderick Phenix Primary Care Jerri Hargadon: Gwinda Passe Other Clinician: Referring Lynsee Wands: Treating Leilene Diprima/Extender: Aurelio Brash in Treatment: 19 Edema Assessment Assessed: Kyra Searles: No] Franne Forts: No] Edema: [Left: Ye] [Right: s] Calf Left: Right: Point of Measurement: 31 cm From Medial Instep 51 cm Ankle Left: Right: Point of Measurement: 9 cm From Medial Instep 28.4 cm Vascular Assessment Extremity colors, hair growth, and conditions: Extremity Color: [Right:Hyperpigmented] Hair Growth on Extremity: [Right:No] Temperature of Extremity: [Right:Warm] Capillary Refill: [Right:< 3 seconds] Dependent Rubor: [Right:No Yes] Electronic Signature(s) Signed: 12/19/2022 4:50:46 PM By: Samuella Bruin Entered By: Samuella Bruin on 12/19/2022 12:56:53 -------------------------------------------------------------------------------- Multi Wound Chart Details Patient Name: Date of  Service: [name] V IS, [name] 12/19/2022 12:45 PM Medical Record Number: [id number] Patient Account Number: [id number] Date of Birth/Sex: Treating RN: 10/01/1980 (42 y.o. F) Primary Care [name]: Gwinda Passe Other Clinician: Referring Tamario Heal: Treating Alura Olveda/Extender: Aurelio Brash in Treatment: 19 Vital Signs Height(in): 69 Pulse(bpm): 90 Weight(lbs): 330 Blood Pressure(mmHg): 116/76 Body Mass Index(BMI): 48.7 Temperature(F): 97.7 Respiratory Rate(breaths/min): 16 [4:Photos:] [N/A:N/A] Right Amputation Site - N/A N/A Wound Location: Transmetatarsal Shear/Friction N/A N/A Wounding Event: Diabetic Wound/Ulcer of the Lower N/A N/A Primary Etiology: Extremity Dehisced Wound N/A N/A Secondary Etiology: Deep Vein Thrombosis, Hypertension, N/A N/A Comorbid History: Type II Diabetes, Osteomyelitis,  Neuropathy 07/08/2022 N/A N/A Date Acquired: 80 N/A N/A Weeks of Treatment: Open N/A N/A Wound Status: No N/A N/A Wound Recurrence: 1.5x1x1.4 N/A N/A Measurements L x W x D (cm) 1.178 N/A N/A A (cm) : rea 1.649 N/A N/A Volume (cm) : -435.50% N/A N/A % Reduction in A rea: -836.90% N/A N/A % Reduction in Volume: 12 Position 1 (o'clock): 1.1 Maximum Distance 1 (cm): Yes N/A N/A Tunneling: Grade 3 N/A N/A Classification: Medium N/A N/A Exudate A mount: Serosanguineous N/A N/A Exudate Type: red, brown N/A N/A Exudate Color: Distinct, outline attached N/A N/A Wound Margin: Large (67-100%) N/A N/A Granulation A mount: Red, Pink N/A N/A Granulation Quality: Small (1-33%) N/A N/A Necrotic A mount: Fat Layer (Subcutaneous Tissue): Yes N/A N/A Exposed Structures: Fascia: No Tendon: No Muscle: No Joint: No Bone: No Small (1-33%) N/A N/A Epithelialization: Debridement - Excisional N/A N/A Debridement: Pre-procedure Verification/Time Out 13:10 N/A N/A Taken: Lidocaine 5% topical ointment N/A N/A Pain Control: Callus,  Subcutaneous N/A N/A Tissue Debrided: Skin/Subcutaneous Tissue N/A N/A Level: 1.18 N/A N/A Debridement A (sq cm): rea Curette N/A N/A Instrument: Minimum N/A N/A Bleeding: Pressure N/A N/A Hemostasis A chieved: Procedure was tolerated well N/A N/A Debridement Treatment Response: 1.5x1x1.4 N/A N/A Post Debridement Measurements L x W x D (cm) 1.649 N/A N/A Post Debridement Volume: (cm) Callus: Yes N/A N/A Periwound Skin Texture: Excoriation: No Induration: No Crepitus: No Rash: No Scarring: No Maceration: Yes N/A N/A Periwound Skin Moisture: Dry/Scaly: No Atrophie Blanche: No N/A N/A Periwound Skin Color: Cyanosis: No Ecchymosis: No Erythema: No Hemosiderin Staining: No Mottled: No Pallor: No Rubor: No No Abnormality N/A N/A Temperature: Yes N/A N/A Tenderness on Palpation: Debridement N/A N/A Procedures Performed: T Contact Cast otal Treatment Notes Electronic Signature(s) Signed: 12/19/2022 5:41:20 PM By: Baltazar Najjar MD Entered By: Baltazar Najjar on 12/19/2022 13:50:04 Grode, Sharol Harness ([id number]) [id number] Page 4 of 8 -------------------------------------------------------------------------------- Multi-Disciplinary Care Plan Details Patient Name: Date of Service: [name], [place] [name] 12/19/2022 12:45 PM Medical Record Number: [id number] Patient Account Number: [id number] Date of Birth/Sex: Treating RN: September 06, 1980 (42 y.o. Fredderick Phenix Primary Care Yacob Wilkerson: Gwinda Passe Other Clinician: Referring Dontavius Keim: Treating Zakariye Nee/Extender: Aurelio Brash in Treatment: 19 Active Inactive Necrotic Tissue Nursing Diagnoses: Knowledge deficit related to management of necrotic/devitalized tissue Goals: Necrotic/devitalized tissue will be minimized in the wound bed Date Initiated: 08/08/2022 Target Resolution Date: 01/13/2024 Goal Status: Active Patient/caregiver will verbalize  understanding of reason and process for debridement of necrotic tissue Date Initiated: 08/08/2022 Target Resolution Date: 01/13/2024 Goal Status: Active Interventions: Assess patient pain level pre-, during and post procedure and prior to discharge Provide education on necrotic tissue and debridement process Notes: Nutrition Nursing Diagnoses: Impaired glucose control: actual or potential Goals: Patient/caregiver agrees to and verbalizes understanding of need to obtain nutritional consultation Date Initiated: 08/08/2022 Target Resolution Date: 01/13/2024 Goal Status: Active Patient/caregiver will maintain therapeutic glucose control Date Initiated: 08/08/2022 Target Resolution Date: 01/13/2024 Goal Status: Active Interventions: Assess HgA1c results as ordered upon admission and as needed Provide education on elevated blood sugars and impact on wound healing Provide education on nutrition Treatment Activities: Obtain HgA1c : 08/08/2022 Patient referred to Primary Care Physician for further nutritional evaluation : 08/08/2022 Notes: Pain, Acute or Chronic Nursing Diagnoses: Pain, acute or chronic: actual or potential Potential alteration in comfort, pain Goals: Patient will verbalize adequate pain control and receive pain control interventions during procedures as needed Date Initiated: 08/08/2022 Target Resolution Date: 01/13/2024 Goal Status:  Active Patient/caregiver will verbalize comfort level met Date Initiated: 08/08/2022 Target Resolution Date: 01/13/2024 Goal Status: Active Interventions: AMINATA, COONS ([id number]) [id number] Page 5 of 8 Complete pain assessment as per visit requirements Encourage patient to take pain medications as prescribed Provide education on pain management Treatment Activities: Administer pain control measures as ordered : 08/08/2022 Notes: Wound/Skin Impairment Nursing Diagnoses: Knowledge deficit related to  ulceration/compromised skin integrity Goals: Patient/caregiver will verbalize understanding of skin care regimen Date Initiated: 08/08/2022 Target Resolution Date: 01/13/2024 Goal Status: Active Ulcer/skin breakdown will heal within 14 weeks Date Initiated: 08/08/2022 Target Resolution Date: 01/13/2024 Goal Status: Active Interventions: Assess patient/caregiver ability to perform ulcer/skin care regimen upon admission and as needed Assess ulceration(s) every visit Provide education on ulcer and skin care Treatment Activities: Skin care regimen initiated : 08/08/2022 Topical wound management initiated : 08/08/2022 Notes: Electronic Signature(s) Signed: 12/19/2022 4:50:46 PM By: Samuella Bruin Entered By: Samuella Bruin on 12/19/2022 13:13:54 -------------------------------------------------------------------------------- Pain Assessment Details Patient Name: Date of Service: [name] Pereyra, [name] 12/19/2022 12:45 PM Medical Record Number: [id number] Patient Account Number: [id number] Date of Birth/Sex: Treating RN: 1981/01/04 (42 y.o. Fredderick Phenix Primary Care Novie Maggio: Gwinda Passe Other Clinician: Referring Annalie Wenner: Treating Johneric Mcfadden/Extender: Aurelio Brash in Treatment: 19 Active Problems Location of Pain Severity and Description of Pain Patient Has Paino Yes Site Locations Pain Location: Pain in Ulcers Duration of the Pain. Constant / Intermittento Constant Rate the pain. Current Pain Level: 3 Krantz, Harman L ([id number]) [id number] Page 6 of 8 Character of Pain Describe the Pain: Aching Pain Management and Medication Current Pain Management: Medication: Yes Electronic Signature(s) Signed: 12/19/2022 4:50:46 PM By: Samuella Bruin Entered By: Samuella Bruin on 12/19/2022 12:56:50 -------------------------------------------------------------------------------- Patient/Caregiver Education  Details Patient Name: Date of Service: [name] [name], [name] 12/5/2024andnbsp12:45 PM Medical Record Number: [id number] Patient Account Number: [id number] Date of Birth/Gender: Treating RN: [birth year] (42 y.o. Fredderick Phenix Primary Care Physician: Gwinda Passe Other Clinician: Referring Physician: Treating Physician/Extender: Aurelio Brash in Treatment: 19 Education Assessment Education Provided To: Patient Education Topics Provided Wound/Skin Impairment: Methods: Explain/Verbal Responses: Reinforcements needed, State content correctly Electronic Signature(s) Signed: 12/19/2022 4:50:46 PM By: Samuella Bruin Entered By: Samuella Bruin on 12/19/2022 13:14:04 -------------------------------------------------------------------------------- Wound Assessment Details Patient Name: Date of Service: [name] [name], [name] 12/19/2022 12:45 PM Medical Record Number: [id number] Patient Account Number: [id number] Date of Birth/Sex: Treating RN: 03-15-1980 (42 y.o. Fredderick Phenix Primary Care Tzipporah Nagorski: Gwinda Passe Other Clinician: Referring Asja Frommer: Treating Emett Stapel/Extender: Aurelio Brash in Treatment: 19 Wound Status Wound Number: 4 Primary Diabetic Wound/Ulcer of the Lower Extremity Etiology: Wound Location: Right Amputation Site - Transmetatarsal Secondary Dehisced Wound Wounding Event: Shear/Friction Etiology: Date Acquired: 07/08/2022 Wound Status: Open Weeks Of Treatment: 19 Comorbid Deep Vein Thrombosis, Hypertension, Type II Diabetes, Clustered Wound: No History: Osteomyelitis, Neuropathy Photos SAILOR, KALLENBERGER L ([id number]) [id number] Page 7 of 8 Wound Measurements Length: (cm) 1.5 Width: (cm) 1 Depth: (cm) 1.4 Area: (cm) 1.178 Volume: (cm) 1.649 % Reduction in Area: -435.5% % Reduction in Volume: -836.9% Epithelialization: Small (1-33%) Tunneling: Yes Position  (o'clock): 12 Maximum Distance: (cm) 1.1 Undermining: No Wound Description Classification: Grade 3 Wound Margin: Distinct, outline attached Exudate Amount: Medium Exudate Type: Serosanguineous Exudate Color: red, brown Foul Odor After Cleansing: No Slough/Fibrino Yes Wound Bed Granulation Amount: Large (67-100%) Exposed Structure Granulation Quality: Red, Pink Fascia Exposed: No Necrotic Amount: Small (1-33%) Fat Layer (Subcutaneous Tissue) Exposed: Yes Necrotic Quality: Adherent  Slough Tendon Exposed: No Muscle Exposed: No Joint Exposed: No Bone Exposed: No Periwound Skin Texture Texture Color No Abnormalities Noted: No No Abnormalities Noted: Yes Callus: Yes Temperature / Pain Crepitus: No Temperature: No Abnormality Excoriation: No Tenderness on Palpation: Yes Induration: No Rash: No Scarring: No Moisture No Abnormalities Noted: Yes Treatment Notes Wound #4 (Amputation Site - Transmetatarsal) Wound Laterality: Right Cleanser Soap and Water Discharge Instruction: May shower and wash wound with dial antibacterial soap and water prior to dressing change. On days not changing the dressing, please keep wound/dressing dry Peri-Wound Care Topical Gentamicin Discharge Instruction: As directed by physician Skintegrity Hydrogel 4 (oz) Discharge Instruction: Apply hydrogel as directed Primary Dressing Promogran Prisma Matrix, 4.34 (sq in) (silver collagen) Discharge Instruction: Moisten collagen with saline or hydrogel Secondary Dressing Optifoam Non-Adhesive Dressing, 4x4 in ZAIDYN, BARTUNEK L ([id number]) [id number] Page 8 of 8 Discharge Instruction: Apply over primary dressing as directed. Woven Gauze Sponge, Non-Sterile 4x4 in Discharge Instruction: Apply over primary dressing as directed. Zetuvit Plus 4x8 in Discharge Instruction: Apply over primary dressing as directed. Secured With [employer], 4.5x3.1 (in/yd) Discharge  Instruction: Secure with Kerlix as directed. 34M Medipore H Soft Cloth Surgical T ape, 4 x 10 (in/yd) Discharge Instruction: Secure with tape as directed. Compression Wrap Compression Stockings Add-Ons Electronic Signature(s) Signed: 12/19/2022 4:50:46 PM By: Samuella Bruin Entered By: Samuella Bruin on 12/19/2022 13:04:31 -------------------------------------------------------------------------------- Vitals Details Patient Name: Date of Service: [name] V IS, [name] 12/19/2022 12:45 PM Medical Record Number: [id number] Patient Account Number: [id number] Date of Birth/Sex: Treating RN: Dec 24, 1980 (42 y.o. Fredderick Phenix Primary Care Skye Plamondon: Gwinda Passe Other Clinician: Referring Dabney Dever: Treating Juliona Vales/Extender: Aurelio Brash in Treatment: 19 Vital Signs Time Taken: 12:56 Temperature (F): 97.7 Height (in): 69 Pulse (bpm): 90 Weight (lbs): 330 Respiratory Rate (breaths/min): 16 Body Mass Index (BMI): 48.7 Blood Pressure (mmHg): 116/76 Reference Range: 80 - 120 mg / dl Electronic Signature(s) Signed: 12/19/2022 4:50:46 PM By: Samuella Bruin Entered By: Samuella Bruin on 12/19/2022 12:56:34

## 2022-12-20 NOTE — Telephone Encounter (Signed)
Copied from CRM [id number]. Topic: General - Inquiry >> Dec 20, 2022 11:23 AM Clide Dales wrote: Patient would like a callback from provider to discuss a referral.

## 2022-12-23 ENCOUNTER — Telehealth: Payer: Self-pay | Admitting: Primary Care

## 2022-12-23 ENCOUNTER — Encounter (HOSPITAL_BASED_OUTPATIENT_CLINIC_OR_DEPARTMENT_OTHER): Payer: Medicaid Other | Admitting: Internal Medicine

## 2022-12-23 DIAGNOSIS — E11621 Type 2 diabetes mellitus with foot ulcer: Secondary | ICD-10-CM | POA: Diagnosis not present

## 2022-12-23 NOTE — Telephone Encounter (Signed)
Will forward to provider  

## 2022-12-23 NOTE — Progress Notes (Signed)
JOYELLE, DEBLASE ([id number]) [id number] Page 1 of 9 Visit Report for 12/23/2022 Arrival Information Details Patient Name: Date of Service: [name], [place] [name]. 12/23/2022 12:30 PM Medical Record Number: [id number] Patient Account Number: [id number] Date of Birth/Sex: Treating RN: [birth year] (42 y.o. [name], [name] Primary Care Alena Blankenbeckler: Gwinda Passe Other Clinician: Referring Lynora Dymond: Treating Tonjia Parillo/Extender: Aurelio Brash in Treatment: 19 Visit Information History Since Last Visit Added or deleted any medications: No Patient Arrived: Wheel Chair Any new allergies or adverse reactions: No Arrival Time: 12:59 Had a fall or experienced change in No Accompanied By: self activities of daily living that may affect Transfer Assistance: None risk of falls: Patient Identification Verified: Yes Signs or symptoms of abuse/neglect since last visito No Secondary Verification Process Completed: Yes Hospitalized since last visit: No Patient Requires Transmission-Based Precautions: No Implantable device outside of the clinic excluding No Patient Has Alerts: Yes cellular tissue based products placed in the center Patient Alerts: Patient on Blood Thinner since last visit: Eliquis Has Dressing in Place as Prescribed: Yes ABI R 1.02 (08/21/22) Has Footwear/Offloading in Place as Prescribed: Yes ABI [name] 0.99 (08/21/22) Right: Wedge Shoe Pain Present Now: No Electronic Signature(s) Signed: 12/23/2022 4:19:11 PM By: Shawn Stall RN, BSN Entered By: Shawn Stall on 12/23/2022 13:00:20 -------------------------------------------------------------------------------- Encounter Discharge Information Details Patient Name: Date of Service: [name], [place] [name]. 12/23/2022 12:30 PM Medical Record Number: [id number] Patient Account Number: [id number] Date of Birth/Sex: Treating RN: [birth year] (42 y.o. [name], [name] Primary Care Jona Erkkila: Gwinda Passe Other Clinician: Referring Mckinna Demars: Treating Arzu Mcgaughey/Extender: Aurelio Brash in Treatment: 19 Encounter Discharge Information Items Discharge Condition: Stable Ambulatory Status: Wheelchair Discharge Destination: Home Transportation: Private Auto Accompanied By: mother Schedule Follow-up Appointment: Yes Clinical Summary of Care: Electronic Signature(s) Signed: 12/23/2022 4:19:11 PM By: Shawn Stall RN, BSN Entered By: Shawn Stall on 12/23/2022 13:35:10 Amsden, Sharol Harness ([id number]) [id number] Page 2 of 9 -------------------------------------------------------------------------------- Lower Extremity Assessment Details Patient Name: Date of Service: [name] IS, MA [name]. 12/23/2022 12:30 PM Medical Record Number: [id number] Patient Account Number: [id number] Date of Birth/Sex: Treating RN: 1980/09/09 (42 y.o. [name] Primary Care Lachlan Mckim: Gwinda Passe Other Clinician: Referring Rachyl Wuebker: Treating Tegh Franek/Extender: Aurelio Brash in Treatment: 19 Edema Assessment Assessed: [name]: No] Franne Forts: Yes] Edema: [Left: Ye] [Right: s] Calf Left: Right: Point of Measurement: 31 cm From Medial Instep 51 cm Ankle Left: Right: Point of Measurement: 9 cm From Medial Instep 28.4 cm Vascular Assessment Pulses: Dorsalis Pedis Palpable: [Right:Yes] Extremity colors, hair growth, and conditions: Extremity Color: [Right:Hyperpigmented] Hair Growth on Extremity: [Right:No] Temperature of Extremity: [Right:Warm] Capillary Refill: [Right:< 3 seconds] Dependent Rubor: [Right:No] Blanched when Elevated: [Right:No Yes] Electronic Signature(s) Signed: 12/23/2022 4:19:11 PM By: Shawn Stall RN, BSN Entered By: Shawn Stall on 12/23/2022 13:11:32 -------------------------------------------------------------------------------- Multi Wound Chart Details Patient Name: Date of Service: [name], MA  [name]. 12/23/2022 12:30 PM Medical Record Number: [id number] Patient Account Number: [id number] Date of Birth/Sex: Treating RN: 1980/01/23 (42 y.o. F) Primary Care Maks Cavallero: Gwinda Passe Other Clinician: Referring Rosealee Recinos: Treating Makiyla Linch/Extender: Aurelio Brash in Treatment: 19 Vital Signs Height(in): 69 Pulse(bpm): 101 Weight(lbs): 330 Blood Pressure(mmHg): 126/81 Body Mass Index(BMI): 48.7 Temperature(F): 98.7 Respiratory Rate(breaths/min): 20 [4:Photos:] [N/A:N/A] Right Amputation Site - N/A N/A Wound Location: Transmetatarsal Shear/Friction N/A N/A Wounding Event: Diabetic Wound/Ulcer of the Lower N/A N/A Primary Etiology: Extremity Dehisced Wound N/A N/A Secondary Etiology: Deep Vein Thrombosis, Hypertension, N/A N/A  Comorbid History: Type II Diabetes, Osteomyelitis, Neuropathy 07/08/2022 N/A N/A Date Acquired: 38 N/A N/A Weeks of Treatment: Open N/A N/A Wound Status: No N/A N/A Wound Recurrence: 1.3x0.7x0.7 N/A N/A Measurements [name] x W x D (cm) 0.715 N/A N/A A (cm) : rea 0.5 N/A N/A Volume (cm) : -225.00% N/A N/A % Reduction in A rea: -184.10% N/A N/A % Reduction in Volume: 12 Starting Position 1 (o'clock): 6 Ending Position 1 (o'clock): 1.4 Maximum Distance 1 (cm): Yes N/A N/A Undermining: Grade 3 N/A N/A Classification: Medium N/A N/A Exudate A mount: Serosanguineous N/A N/A Exudate Type: red, brown N/A N/A Exudate Color: Thickened N/A N/A Wound Margin: Large (67-100%) N/A N/A Granulation A mount: Red, Pink N/A N/A Granulation Quality: Small (1-33%) N/A N/A Necrotic A mount: Fat Layer (Subcutaneous Tissue): Yes N/A N/A Exposed Structures: Fascia: No Tendon: No Muscle: No Joint: No Bone: No Small (1-33%) N/A N/A Epithelialization: Callus: Yes N/A N/A Periwound Skin Texture: Excoriation: No Induration: No Crepitus: No Rash: No Scarring: No Maceration: Yes N/A N/A Periwound Skin  Moisture: Dry/Scaly: No Atrophie Blanche: No N/A N/A Periwound Skin Color: Cyanosis: No Ecchymosis: No Erythema: No Hemosiderin Staining: No Mottled: No Pallor: No Rubor: No No Abnormality N/A N/A Temperature: Yes N/A N/A Tenderness on Palpation: T Contact Cast otal N/A N/A Procedures Performed: Treatment Notes Wound #4 (Amputation Site - Transmetatarsal) Wound Laterality: Right Cleanser Soap and Water Discharge Instruction: May shower and wash wound with dial antibacterial soap and water prior to dressing change. On days not changing the dressing, please keep wound/dressing dry Peri-Wound Care Topical Gentamicin Discharge Instruction: As directed by physician Skintegrity Hydrogel 4 (oz) Discharge Instruction: Apply hydrogel as directed Primary Dressing Promogran Prisma Matrix, 4.34 (sq in) (silver collagen) Discharge Instruction: Moisten collagen with saline or hydrogel Ayoub, Akaysha [name] ([id number]) [id number] Page 4 of 9 Secondary Dressing Woven Gauze Sponge, Non-Sterile 4x4 in Discharge Instruction: Apply over primary dressing as directed. Zetuvit Plus 4x8 in Discharge Instruction: Apply over primary dressing as directed. Secured With [employer], 4.5x3.1 (in/yd) Discharge Instruction: Secure with Kerlix as directed. 56M Medipore H Soft Cloth Surgical T ape, 4 x 10 (in/yd) Discharge Instruction: Secure with tape as directed. total contact cast size 4 Discharge Instruction: last layer applied by [name]. Compression Wrap Compression Stockings Add-Ons Electronic Signature(s) Signed: 12/23/2022 4:43:54 PM By: Baltazar Najjar MD Entered By: Baltazar Najjar on 12/23/2022 13:53:17 -------------------------------------------------------------------------------- Multi-Disciplinary Care Plan Details Patient Name: Date of Service: [name] IS, MA [name]. 12/23/2022 12:30 PM Medical Record Number: [id number] Patient Account Number:  [id number] Date of Birth/Sex: Treating RN: Nov 04, 1980 (42 y.o. [name] Primary Care Nikitta Sobiech: Gwinda Passe Other Clinician: Referring Sumeet Geter: Treating Creedence Heiss/Extender: Aurelio Brash in Treatment: 19 Active Inactive Necrotic Tissue Nursing Diagnoses: Knowledge deficit related to management of necrotic/devitalized tissue Goals: Necrotic/devitalized tissue will be minimized in the wound bed Date Initiated: 08/08/2022 Target Resolution Date: 01/13/2024 Goal Status: Active Patient/caregiver will verbalize understanding of reason and process for debridement of necrotic tissue Date Initiated: 08/08/2022 Target Resolution Date: 01/13/2024 Goal Status: Active Interventions: Assess patient pain level pre-, during and post procedure and prior to discharge Provide education on necrotic tissue and debridement process Notes: Nutrition Nursing Diagnoses: Impaired glucose control: actual or potential Goals: Patient/caregiver agrees to and verbalizes understanding of need to obtain nutritional consultation Date Initiated: 08/08/2022 Target Resolution Date: 01/13/2024 JIAH, HADE ([id number]) [id number] Page 5 of 9 Goal Status: Active Patient/caregiver will maintain therapeutic glucose control Date Initiated: 08/08/2022 Target Resolution Date:  01/13/2024 Goal Status: Active Interventions: Assess HgA1c results as ordered upon admission and as needed Provide education on elevated blood sugars and impact on wound healing Provide education on nutrition Treatment Activities: Obtain HgA1c : 08/08/2022 Patient referred to Primary Care Physician for further nutritional evaluation : 08/08/2022 Notes: Pain, Acute or Chronic Nursing Diagnoses: Pain, acute or chronic: actual or potential Potential alteration in comfort, pain Goals: Patient will verbalize adequate pain control and receive pain control interventions during  procedures as needed Date Initiated: 08/08/2022 Target Resolution Date: 01/13/2024 Goal Status: Active Patient/caregiver will verbalize comfort level met Date Initiated: 08/08/2022 Target Resolution Date: 01/13/2024 Goal Status: Active Interventions: Complete pain assessment as per visit requirements Encourage patient to take pain medications as prescribed Provide education on pain management Treatment Activities: Administer pain control measures as ordered : 08/08/2022 Notes: Wound/Skin Impairment Nursing Diagnoses: Knowledge deficit related to ulceration/compromised skin integrity Goals: Patient/caregiver will verbalize understanding of skin care regimen Date Initiated: 08/08/2022 Target Resolution Date: 01/13/2024 Goal Status: Active Ulcer/skin breakdown will heal within 14 weeks Date Initiated: 08/08/2022 Target Resolution Date: 01/13/2024 Goal Status: Active Interventions: Assess patient/caregiver ability to perform ulcer/skin care regimen upon admission and as needed Assess ulceration(s) every visit Provide education on ulcer and skin care Treatment Activities: Skin care regimen initiated : 08/08/2022 Topical wound management initiated : 08/08/2022 Notes: Electronic Signature(s) Signed: 12/23/2022 4:19:11 PM By: Shawn Stall RN, BSN Entered By: Shawn Stall on 12/23/2022 13:26:57 Dillehay, Sharol Harness ([id number]) [id number] Page 6 of 9 -------------------------------------------------------------------------------- Pain Assessment Details Patient Name: Date of Service: [name] IS, [place] [name]. 12/23/2022 12:30 PM Medical Record Number: [id number] Patient Account Number: [id number] Date of Birth/Sex: Treating RN: Aug 12, 1980 (42 y.o. [name], [name] Primary Care Keelie Zemanek: Gwinda Passe Other Clinician: Referring Suzy Kugel: Treating Isabele Lollar/Extender: Aurelio Brash in Treatment: 19 Active Problems Location of Pain Severity  and Description of Pain Patient Has Paino No Site Locations Pain Management and Medication Current Pain Management: Medication: No Cold Application: No Rest: No Massage: No Activity: No T.E.N.S.: No Heat Application: No Leg drop or elevation: No Is the Current Pain Management Adequate: Adequate How does your wound impact your activities of daily livingo Sleep: No Bathing: No Appetite: No Relationship With Others: No Bladder Continence: No Emotions: No Bowel Continence: No Work: No Toileting: No Drive: No Dressing: No Hobbies: No Electronic Signature(s) Signed: 12/23/2022 4:19:11 PM By: Shawn Stall RN, BSN Entered By: Shawn Stall on 12/23/2022 13:01:00 -------------------------------------------------------------------------------- Patient/Caregiver Education Details Patient Name: Date of Service: [name], MA [name]. 12/9/2024andnbsp12:30 PM Medical Record Number: [id number] Patient Account Number: [id number] Date of Birth/Gender: Treating RN: 12-06-1980 (42 y.o. [name] Primary Care Physician: Gwinda Passe Other Clinician: Referring Physician: Treating Physician/Extender: Aurelio Brash in Treatment: [address] Education Assessment Education Provided To: [name] ([id number]) [id number] Page 7 of 9 Patient Education Topics Provided Offloading: Handouts: How Offloading Helps Foot Wounds Heal Methods: Explain/Verbal Responses: State content correctly Electronic Signature(s) Signed: 12/23/2022 4:19:11 PM By: Shawn Stall RN, BSN Entered By: Shawn Stall on 12/23/2022 13:34:20 -------------------------------------------------------------------------------- Wound Assessment Details Patient Name: Date of Service: [name], MA [name]. 12/23/2022 12:30 PM Medical Record Number: [id number] Patient Account Number: [id number] Date of Birth/Sex: Treating RN: 1980/09/22 (42 y.o. 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 Location: Right Amputation Site -  Transmetatarsal Secondary Dehisced Wound Wounding Event: Shear/Friction Etiology: Date Acquired: 07/08/2022 Wound Status: Open Weeks Of Treatment: 19 Comorbid Deep Vein Thrombosis, Hypertension, Type II Diabetes, Clustered Wound: No History: Osteomyelitis, Neuropathy Photos Wound Measurements Length: (cm) 1.3 Width: (cm) 0.7 Depth: (cm) 0.7 Area: (cm) 0.715 Volume: (cm) 0.5 % Reduction in Area: -225% % Reduction in Volume: -184.1% Epithelialization: Small (1-33%) Tunneling: No Undermining: Yes Starting Position (o'clock): 12 Ending Position (o'clock): 6 Maximum Distance: (cm) 1.4 Wound Description Classification: Grade 3 Wound Margin: Thickened Exudate Amount: Medium Exudate Type: Serosanguineous Exudate Color: red, brown Foul Odor After Cleansing: No Slough/Fibrino Yes Wound Bed Granulation Amount: Large (67-100%) Exposed Structure Granulation Quality: Red, Pink Fascia Exposed: No Wolin, [name] ([id number]) [id number] Page 8 of 9 Necrotic Amount: Small (1-33%) Fat Layer (Subcutaneous Tissue) Exposed: Yes Necrotic Quality: Adherent Slough Tendon Exposed: No Muscle Exposed: No Joint Exposed: No Bone Exposed: No Periwound Skin Texture Texture Color No Abnormalities Noted: No No Abnormalities Noted: Yes Callus: Yes Temperature / Pain Crepitus: No Temperature: No Abnormality Excoriation: No Tenderness on Palpation: Yes Induration: No Rash: No Scarring: No Moisture No Abnormalities Noted: Yes Treatment Notes Wound #4 (Amputation Site - Transmetatarsal) Wound Laterality: Right Cleanser Soap and Water Discharge Instruction: May shower and wash wound  with dial antibacterial soap and water prior to dressing change. On days not changing the dressing, please keep wound/dressing dry Peri-Wound Care Topical Gentamicin Discharge Instruction: As directed by physician Skintegrity Hydrogel 4 (oz) Discharge Instruction: Apply hydrogel as directed Primary Dressing Promogran Prisma Matrix, 4.34 (sq in) (silver collagen) Discharge Instruction: Moisten collagen with saline or hydrogel Secondary Dressing Woven Gauze Sponge, Non-Sterile 4x4 in Discharge Instruction: Apply over primary dressing as directed. Zetuvit Plus 4x8 in Discharge Instruction: Apply over primary dressing as directed. Secured With [employer], 4.5x3.1 (in/yd) Discharge Instruction: Secure with Kerlix as directed. 34M Medipore H Soft Cloth Surgical T ape, 4 x 10 (in/yd) Discharge Instruction: Secure with tape as directed. total contact cast size 4 Discharge Instruction: last layer applied by Mars Scheaffer. Compression Wrap Compression Stockings Add-Ons Electronic Signature(s) Signed: 12/23/2022 4:19:11 PM By: Shawn Stall RN, BSN Entered By: Shawn Stall on 12/23/2022 13:15:45 -------------------------------------------------------------------------------- Vitals Details Patient Name: Date of Service: [name], MA [name]. 12/23/2022 12:30 PM Yearsley, Sharol Harness ([id number]) [id number] Page 9 of 9 Medical Record Number: [id number] Patient Account Number: [id number] Date of Birth/Sex: Treating RN: June 27, 1980 (42 y.o. [name], [name] Primary Care Shya Kovatch: Gwinda Passe Other Clinician: Referring Arthuro Canelo: Treating Aidden Markovic/Extender: Aurelio Brash in Treatment: 19 Vital Signs Time Taken: 13:00 Temperature (F): 98.7 Height (in): 69 Pulse (bpm): 101 Weight (lbs): 330 Respiratory Rate (breaths/min): 20 Body Mass Index (BMI): 48.7 Blood Pressure (mmHg): 126/81 Reference Range: 80 - 120 mg / dl Electronic  Signature(s) Signed: 12/23/2022 4:19:11 PM By: Shawn Stall RN, BSN Entered By: Shawn Stall on 12/23/2022 13:00:51

## 2022-12-23 NOTE — Telephone Encounter (Signed)
Pt returned call requesting to speak to [place]. Attempted to connect pt with [facility] via conference call, pt hung up.

## 2022-12-23 NOTE — Telephone Encounter (Signed)
Pt is calling back to speak to [place]. Please advise CB- [phone]

## 2022-12-23 NOTE — Telephone Encounter (Signed)
Copied from CRM [id number]. Topic: General - Other >> Dec 20, 2022  5:16 PM Santiya F wrote: Reason for CRM: Pt is calling in requesting to speak with Marcelino Duster.

## 2022-12-23 NOTE — Progress Notes (Signed)
EDITH, LARGO ([id number]) [id number] Page 1 of 10 Visit Report for 12/23/2022 HPI Details Patient Name: Date of Service: [name] IS, [place] [name] 12/23/2022 12:30 PM Medical Record Number: [id number] Patient Account Number: [id number] Date of Birth/Sex: Treating RN: [birth year] (42 y.o. F) Primary Care Provider: Gwinda Passe Other Clinician: Referring Provider: Treating Provider/Extender: Aurelio Brash in Treatment: 19 History of Present Illness HPI Description: Admission 01/22/2021 Ms. Jola Coffie is a 42 year old female with a past medical history of insulin-dependent uncontrolled type 2 diabetes with last hemoglobin A1c of 13.5, osteomyelitis of the right foot status post transmetatarsal amputation on 12/18/2020 that presents to the clinic for right foot wound. She has had dehiscence of the surgical site. She is currently using wet-to-dry dressings. She has a PICC line and receiving IV ceftriaxone daily for her osteomyelitis. There is an end date of 01/27/2021. She is also taking oral metronidazole. She currently denies systemic signs of infection. 1/19; patient presents for follow-up. She was diagnosed with a DVT to the right lower extremity 2 days ago. She is on Eliquis now. She is scheduled to see her infectious disease doctor tomorrow. She has been using Dakin's wet-to-dry dressings. She denies systemic signs of infection. 1/26; patient presents for follow-up. She saw infectious disease on 1/21 started on Augmentin. Her PICC line and IV ceftriaxone was discontinued. Patient reports stability to her wound. She has been using Dakin's wet-to-dry dressings. She currently denies systemic signs of infection. 2/3; patient presents for follow-up. She continues to use Dakin's wet-to-dry dressings to the wound bed. She saw Dr. Manson Passey with infectious disease yesterday and is continuing Augmentin. T entative end date is 2/16. Patient reports  following up with orthopedics. She states there is no further plan from them. She currently denies systemic signs of infection. 2/10; patient presents for follow-up. She continues to use Dakin's wet to dry dressings. She is scheduled to have her MRI done on 2/14. She states that she had pain to the debridement site from last clinic visit and declines debridement today. She denies systemic signs of infection. She continues to have yellow thick drainage. 2/20; patient presents for follow-up. She continues to use Dakin's wet-to-dry dressings. She obtained her MRI. The results showed an abscess and she is scheduled to see her orthopedic surgeon on 2/23. She saw infectious disease 2/17 and her antibiotics were extended. She currently denies systemic signs of infection. 3/6; patient presents for follow-up. She had debridement and irrigation of her foot on 03/10/2021 due to abscess noted on MRI. She was started on IV ceftriaxone and oral Flagyl. She has no issues or complaints today. She has been using iodoform packing to the tunnel and Dakin's wet-to-dry to the opening. 03/26/2021: She continues on IV ceftriaxone and oral metronidazole. She has follow-up with infectious disease tomorrow. No significant issues or complaints today. Her mother continues to help her with her wound dressing, using iodoform packing strips into the tunnel and Dakin's to the open portion of the wound. 3/20; patient presents for follow-up. She continues to be on IV ceftriaxone in oral metronidazole. She has been using iodoform to the tunnel and Dakin's wet-to-dry to the open wound. She denies signs of infection. 3/27; patient presents for follow-up. She no longer has a PICC line. She has been using iodoform to the tunnel and Dakin's wet-to-dry to the open wound. She reports improvement in wound healing. She denies signs of infection. 4/3; patient presents for follow-up. She states she has been using [employer]	[place]  to the open wound and  iodoform packing to the tunnel without any issues. She denies signs of infection. 4/18; patient presents for follow-up. She saw infectious disease on 4/11. She has finished her oral antibiotics and completed a total of 6 weeks of antibiotics (this includes IV as well). No further antibiotics needed. She has been using Hydrofera Blue and iodoform packing. She states that the tunneled area has come in and the iodoform is not staying in place anymore. She has no issues or complaints today. She denies signs of infection. 4/24; patient presents for follow-up. She saw Dr. Carlene Coria, plastic surgery to discuss potential skin graft/substitute placement. At this time he thinks that the skin graft would likely not take. He is in agreement with trying a wound VAC. Patient has been using Hydrofera Blue dressing changes with no issues. She denies signs of infection. She reports improvement in wound healing. 5/1; patient presents for follow-up. Unfortunately patient did not have insurance when we ran for the pico. There is an assistance program and we are trying to get this accommodated for the patient. In the meantime she has been using Hydrofera Blue without any issues. She denies signs of infection. 5/8; patient presents for follow-up. We have not heard back if pico is covered by her insurance. She has been using collagen to the wound bed over the past week. She denies signs of infection. 5/18; patient presents for follow-up. She has been using collagen to the wound bed without issues. Again we have not heard if pico is covered by her insurance. She has no issues or complaints today. 5/23; patient presents for follow-up. She has been using collagen to the wound bed. She has no issues or complaints today. She obtained the wound VAC from [facility] and brought this in today. She denies signs of infection. 6/1; patient presents for follow-up. She has been using the wound VAC for the past week. She has had this changed  twice since she was last here. She reports more maceration to the periwound. She denies signs of infection. 6/7; right TMA site. There are 2 wounds 0 separated by a bridge of healed tissue. The more lateral area has undermining. Both areas have healthy looking granulation at the base but relative the size of the wound is fairly deep. There is no exposed bone no evidence of infection. Her wound VAC was put on hold last week because of surrounding skin maceration she has been using collagen this week. She has a modified shoe Mahnken, Krysteena L [phone] [id number] Page 2 of 10 6/12; patient presents for follow-up. Last week the wound VAC was reinitiated. She had no issues with the wound VAC itself. Today she has maceration again noted to the surrounding skin. She denies signs of infection. 6/27; patient presents for follow-up. She has been using Medihoney to the wound bed. We took a break from the wound VAC because the periwound was macerated. She still has some areas of maceration to the distal foot where there is a callus. She currently denies signs of infection. 7/11; patient presents for follow-up. She has been using Medihoney to the wound bed. She has developed some increased warmth and erythema to the lateral aspect of the right foot. She states this is occurred over the past week and there is increased pain. No drainage noted. 7/17; patient presents for follow-up. She has been using Medihoney to the wound bed. She completed her course of Bactrim. She reports improvement in symptoms. 7/24; Patient presents for follow up.  She has been using Medihoney to the wound bed without issues. She completed another course of Bactrim. She reports improvement in her symptoms but still has some mild tenderness to the medial aspect of the foot. 7/31; Patient presents for follow-up. She has been using Medihoney and Dakin's to the wound bed. She denies signs of infection. 8/14; patient  presents for follow-up. She has been using Dakin's wet-to-dry packing strips to the right medial aspect of the amputation site and Medihoney to the anterior site. She has no issues or complaints today. She has started physical therapy. She denies signs of infection. 8/29; patient presents for follow-up. She has been using Dakin's wet-to-dry packing strips to the right medial aspect of the amputation site however this is becoming more difficult to place. She did report that she had increased redness and swelling to that site and developed some drainage. It has resolved. She continues with physical therapy. 9/8; patient presents for follow-up. We have been using silver alginate to the tunneled area. She completed her course of antibiotics. She reports no pain, increased swelling or erythema. 9/21; patient presents for follow-up. She has been using gentamicin to the tunneled area. She has no issues or complaints today. She denies signs of infection. 10/2; patient presents for follow-up. She is scheduled to have her CT scan on 10/9. She currently denies signs of infection. She denies increased warmth, erythema or purulent drainage from the wound bed. She has been using Dakin's wet-to-dry dressings. 10/12; patient presents for follow-up. She had her CT scan on 10/9 that showed A small irregular rim-enhancing fluid pocket communicating to the overlying soft tissues of the sinus tract compatible with a small abscess. Currently she denies systemic signs of infection. She has been doing Dakin's wet-to-dry packing strips but it is hard for her to pack into the narrow opening. 10/30; patient presents for follow-up. Since last clinic visit she has had OR debridement of her Right foot by Dr. Odis Hollingshead Due to abscess noted on CT She has . been using iodoform packing. 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 She will complete 2 weeks of this and continue with oral  amoxicillin for the next 6 to 12 months. She follows with Dr. Luciana Axe for this. She currently denies signs of infection. 11/13; patient presents for follow-up. Patient has been using silver alginate with gentamicin to the wound bed. She has no issues or complaints today she. She reports improvement in wound healing. 12/4; patient presents for follow-up. She has been using silver alginate and gentamicin to the wound bed. She has no issues or complaints today. 12/8; patient presents for follow-up. She has been using silver alginate with gentamicin to the wound bed. She presents for her first cast placement. She will be back early next week for her obligatory cast change. 12/12; patient presents for follow-up. We have been using collagen with antibiotic ointment to the wound bed under a total contact cast. She has tolerated this well. She is improved in wound healing. 12/18; patient presents for follow-up. We have been using collagen with antibiotic ointment under the total contact cast. She has no complaints today. 1/2; The cast was placed at last clinic visit however patient missed her follow-up and this was taken off last week at a nurse visit. Patient has been using packing strips to the medial narrowed wound. She has noted no drainage. 1/22; patient presents for follow-up. She has been keeping the area covered. She reports no drainage. She  has had no issues to the previous wound site. She denies any signs of infection including increased warmth, erythema or purulent drainage. 08/08/2022 [name] is a 42 year old female with a past medical history of insulin-dependent uncontrolled type 2 diabetes with last hemoglobin A1c of 8.8, chronic osteomyelitis of the right foot status post transmetatarsal amputation on 12/18/2020 that has been previously treated for surgical dehiscence of this site in our clinic. She presents today 6 months after discharge from our clinic due to healed right foot wound  with now reopening of the previous surgical site. She reports chronic pain to the site however denies purulent drainage, increased warmth or erythema. She noticed the wound opening about 1 month ago. She has been doing physical therapy without significant issues. She has inserts to her shoe however she has been advised to follow-up with [employer] for new custom inserts by her orthopedic surgeon now that she has a new wound. She states she will try and do this. She has been using antibiotic ointment to the wound bed. 8/1; patient presents for follow-up. She has been taking her antibiotics without issues. She has been using Dakin's wet-to-dry dressings to the wound bed. The wound is smaller. She denies systemic signs of infection. 8/13; patient presents for follow-up. She had ABIs completed that were normal. On the right it was 1.02 and on the left was 0.99. She has been using silver alginate to the wound bed with gentamicin ointment. She states it is hard to pack the wound bed. She currently denies signs of infection. 8/29; patient presents for follow-up. She has been using collagen with antibiotic ointment to the wound bed. Wound is stable. She denies signs of infection. 9/5; patient presents for follow-up. She has been using silver alginate to the wound bed. Wound is slightly wider. She denies signs of infection. 9/12; patient presents for follow-up. She has been using silver alginate to the wound bed. Wound is slightly smaller today. 9/19; right foot in the setting of a TMA over the first metatarsal head. She has thick callus around this area small fissured wound which actually looks like it is attempting to close. She has a offloading shoe I am not sure how much activity she is in. I did not have a lot of time to see her today because of transportation 9/26; right foot in the setting of a previous TMA. The wound is over the plantar first metatarsal head. This is a fissured wound with thick callus around  the margins. It is small in terms of overall surface area but has a depth of about 0.6 cm there was no palpable bone. She has a history of chronic osteomyelitis although she has been treated for this I have reviewed CT scans from 2023 also an MRI from 10/26/2021. Neither imaging study showed osteomyelitis in this area although the CT scan suggested osteomyelitis in the fourth metatarsal head. There has been problems with abscesses communicating with the wound although there does not appear to be purulent drainage currently 10/3; right foot at the TMA site. Small probing wound. I changed her to Iodosorb with gauze last time. May be some improvement. She has a depth of about 0.6 cm but there is no palpable bone. She has been previously treated for chronic osteomyelitis in this area. NIEVES, HICKS ([id number]) [id number] Page 3 of 10 10/17; this is a patient with a probing wound at the right TMA site. Underlying osteomyelitis previously treated earlier this year. I been using Iodosorb to try  and help constrict this wound however today she comes in with a larger circumference a larger tunnel and this probably sits right on top of bone. Also worrisome is there was a streaking dark area just caudal to the wound on the dorsal surface of the foot. The patient is complaining of more pain but no fever. 10/24; culture of this area [bone scraping] revealed Acinetobacter and 2 coag negative staph. I am going to give her Septra DS 1 p.o. twice daily for 2 weeks. I think it would be simplistic to call this a bone culture but it certainly was a deep culture. An x-ray was done but that has not been reported yet. I had wanted to get lab work including a CBC with differential ESR and C-reactive protein but I am not actually sure that got done last week. We have been using gent and silver alginate. 11/7; patient is still taking trimethoprim/sulfamethoxazole for the assessment of bacterial  identified last week. Lab work revealed a C-reactive protein of 4.1 which is elevated from 1.7 but not dramatic. White count was normal at 8.4 x-ray of the right foot revealed minimal new lucency and cortical erosion within the adjacent distal plantar aspect of the metatarsals concerning for acute osteomyelitis early. 11/19; the patient has completed her Septra DS which I gave to her for 2 weeks. She has her MRI tomorrow but then we will have the obligatory 5 to 10-day/workday wait to get a report. I am going to resume her Septra DS for a further 2 weeks. Then decide on additional antibiotics, infectious disease etc. 12/5; the patient has completed the Septra DS for the originally cultured as Enterobacter on a bone scraping. Her MRI somewhat surprisingly showed status post amputation of the 1st through 5th metatarsal base but no evidence of osteomyelitis. Surrounding skin thickening with mild edema could represent cellulitis. Previous x- ray showed the suggestion of bone erosion although this was not verified by her MRI. 12/9; patient comes in today for follow-up. She tolerated the total contact cast well. The areas on the right TMA laterally. Medially she has undermining. What I can see of the granulation looks healthy. We are using Prisma and we placed the total contact cast again last week Electronic Signature(s) Signed: 12/23/2022 4:43:54 PM By: Baltazar Najjar MD Entered By: Baltazar Najjar on 12/23/2022 13:54:46 -------------------------------------------------------------------------------- Physical Exam Details Patient Name: Date of Service: [name], [name] 12/23/2022 12:30 PM Medical Record Number: [id number] Patient Account Number: [id number] Date of Birth/Sex: Treating RN: 07/16/1980 (42 y.o. F) Primary Care Provider: Gwinda Passe Other Clinician: Referring Provider: Treating Provider/Extender: Aurelio Brash in Treatment: 19 Constitutional Sitting  or standing Blood Pressure is within target range for patient.. Pulse regular and within target range for patient.Marland Kitchen Respirations regular, non-labored and within target range.Karie Schwalbe emperature is normal and within the target range for the patient.Marland Kitchen Appears in no distress. Notes Wound exam; medial first metatarsal on the right at the amputation site. The wound has depth and undermining this week. I am not sure we completely identified this last time. No evidence of surrounding infection Electronic Signature(s) Signed: 12/23/2022 4:43:54 PM By: Baltazar Najjar MD Entered By: Baltazar Najjar on 12/23/2022 13:56:45 -------------------------------------------------------------------------------- Physician Orders Details Patient Name: Date of Service: [name], [name] 12/23/2022 12:30 PM Medical Record Number: [id number] Patient Account Number: [id number] Date of Birth/Sex: Treating RN: August 08, 1980 (42 y.o. [name] Primary Care Provider: Gwinda Passe Other Clinician: Referring Provider: Treating Provider/Extender: Leanord Hawking  [place] in Treatment: 19 The following information was scribed by: Shawn Stall The information was scribed for: Baltazar Najjar Verbal / Phone Orders: No Diagnosis Coding JESSEL, PUCCIARELLI ([id number]) [id number] Page 4 of 10 ICD-10 Coding Code Description [phone] Non-pressure chronic ulcer of other part of right foot with fat layer exposed E11.621 Type 2 diabetes mellitus with foot ulcer M86.671 Other chronic osteomyelitis, right ankle and foot Follow-up Appointments ppointment in 1 week. - Dr. Leanord Hawking 12/31/2022 230pm (already schedule) T contact cast otal Return A ppointment in 2 weeks. - Dr. Leanord Hawking (Front office to schedule) total contact cast Return A Anesthetic (In clinic) Topical Lidocaine 5% applied to wound bed Bathing/ Shower/ Hygiene May shower with protection but do not get wound dressing(s) wet.  Protect dressing(s) with water repellant cover (for example, large plastic bag) or a cast cover and may then take shower. Other Bathing/Shower/Hygiene Orders/Instructions: - When changing the dressing (every other day) you may get right foot wet. On days NOT changing the dressing.Keep wound/dressing dry Off-Loading Total Contact Cast to Right Lower Extremity - size 4 Wound Treatment Wound #4 - Amputation Site - Transmetatarsal Wound Laterality: Right Cleanser: Soap and Water 1 x Per Week/30 Days Discharge Instructions: May shower and wash wound with dial antibacterial soap and water prior to dressing change. On days not changing the dressing, please keep wound/dressing dry Topical: Gentamicin 1 x Per Week/30 Days Discharge Instructions: As directed by physician Topical: Skintegrity Hydrogel 4 (oz) 1 x Per Week/30 Days Discharge Instructions: Apply hydrogel as directed Prim Dressing: Promogran Prisma Matrix, 4.34 (sq in) (silver collagen) 1 x Per Week/30 Days ary Discharge Instructions: Moisten collagen with saline or hydrogel Secondary Dressing: Woven Gauze Sponge, Non-Sterile 4x4 in 1 x Per Week/30 Days Discharge Instructions: Apply over primary dressing as directed. Secondary Dressing: Zetuvit Plus 4x8 in 1 x Per Week/30 Days Discharge Instructions: Apply over primary dressing as directed. Secured With: [employer], 4.5x3.1 (in/yd) 1 x Per Week/30 Days Discharge Instructions: Secure with Kerlix as directed. Secured With: 44M Medipore H Soft Cloth Surgical T ape, 4 x 10 (in/yd) 1 x Per Week/30 Days Discharge Instructions: Secure with tape as directed. Secured With: total contact cast size 4 1 x Per Week/30 Days Discharge Instructions: last layer applied by provider. Electronic Signature(s) Signed: 12/23/2022 4:19:11 PM By: Shawn Stall RN, BSN Signed: 12/23/2022 4:43:54 PM By: Baltazar Najjar MD Entered By: Shawn Stall on 12/23/2022  13:33:59 -------------------------------------------------------------------------------- Problem List Details Patient Name: Date of Service: [name], [name] 12/23/2022 12:30 PM Medical Record Number: [id number] Patient Account Number: [id number] Date of Birth/Sex: Treating RN: 1980-12-12 (42 y.o. [name] Primary Care Provider: Gwinda Passe Other Clinician: Referring Provider: Treating Provider/Extender: Aurelio Brash in Treatment: [address] [place] L ([id number]) [id number] Page 5 of 10 Active Problems ICD-10 Encounter Code Description Active Date MDM Diagnosis L97.512 Non-pressure chronic ulcer of other part of right foot with fat layer exposed 08/08/2022 No Yes E11.621 Type 2 diabetes mellitus with foot ulcer 08/08/2022 No Yes M86.671 Other chronic osteomyelitis, right ankle and foot 08/08/2022 No Yes Inactive Problems Resolved Problems Electronic Signature(s) Signed: 12/23/2022 4:43:54 PM By: Baltazar Najjar MD Entered By: Baltazar Najjar on 12/23/2022 13:53:06 -------------------------------------------------------------------------------- Progress Note Details Patient Name: Date of Service: DA [name] Kitten, [name] 12/23/2022 12:30 PM Medical Record Number: [id number] Patient Account Number: [id number] Date of Birth/Sex: Treating RN: [birth year] (42 y.o. F) Primary Care Provider: Gwinda Passe Other Clinician: Referring Provider: Treating  Provider/Extender: Aurelio Brash in Treatment: 19 Subjective History of Present Illness (HPI) Admission 01/22/2021 Ms. Kolbee Quill is a 42 year old female with a past medical history of insulin-dependent uncontrolled type 2 diabetes with last hemoglobin A1c of 13.5, osteomyelitis of the right foot status post transmetatarsal amputation on 12/18/2020 that presents to the clinic for right foot wound. She has had dehiscence of the surgical site. She is  currently using wet-to-dry dressings. She has a PICC line and receiving IV ceftriaxone daily for her osteomyelitis. There is an end date of 01/27/2021. She is also taking oral metronidazole. She currently denies systemic signs of infection. 1/19; patient presents for follow-up. She was diagnosed with a DVT to the right lower extremity 2 days ago. She is on Eliquis now. She is scheduled to see her infectious disease doctor tomorrow. She has been using Dakin's wet-to-dry dressings. She denies systemic signs of infection. 1/26; patient presents for follow-up. She saw infectious disease on 1/21 started on Augmentin. Her PICC line and IV ceftriaxone was discontinued. Patient reports stability to her wound. She has been using Dakin's wet-to-dry dressings. She currently denies systemic signs of infection. 2/3; patient presents for follow-up. She continues to use Dakin's wet-to-dry dressings to the wound bed. She saw Dr. Manson Passey with infectious disease yesterday and is continuing Augmentin. T entative end date is 2/16. Patient reports following up with orthopedics. She states there is no further plan from them. She currently denies systemic signs of infection. 2/10; patient presents for follow-up. She continues to use Dakin's wet to dry dressings. She is scheduled to have her MRI done on 2/14. She states that she had pain to the debridement site from last clinic visit and declines debridement today. She denies systemic signs of infection. She continues to have yellow thick drainage. 2/20; patient presents for follow-up. She continues to use Dakin's wet-to-dry dressings. She obtained her MRI. The results showed an abscess and she is scheduled to see her orthopedic surgeon on 2/23. She saw infectious disease 2/17 and her antibiotics were extended. She currently denies systemic signs of infection. 3/6; patient presents for follow-up. She had debridement and irrigation of her foot on 03/10/2021 due to abscess noted  on MRI. She was started on IV ceftriaxone and oral Flagyl. She has no issues or complaints today. She has been using iodoform packing to the tunnel and Dakin's wet-to-dry to the opening. 03/26/2021: She continues on IV ceftriaxone and oral metronidazole. She has follow-up with infectious disease tomorrow. No significant issues or complaints today. Her mother continues to help her with her wound dressing, using iodoform packing strips into the tunnel and Dakin's to the open portion of the wound. 3/20; patient presents for follow-up. She continues to be on IV ceftriaxone in oral metronidazole. She has been using iodoform to the tunnel and Dakin's wet-to-dry to the open wound. She denies signs of infection. 3/27; patient presents for follow-up. She no longer has a PICC line. She has been using iodoform to the tunnel and Dakin's wet-to-dry to the open wound. She reports improvement in wound healing. She denies signs of infection. JACQUANA, MANTEUFEL ([id number]) [id number] Page 6 of 10 4/3; patient presents for follow-up. She states she has been using Hydrofera Blue to the open wound and iodoform packing to the tunnel without any issues. She denies signs of infection. 4/18; patient presents for follow-up. She saw infectious disease on 4/11. She has finished her oral antibiotics and completed a total of 6 weeks of antibiotics (this includes  IV as well). No further antibiotics needed. She has been using Hydrofera Blue and iodoform packing. She states that the tunneled area has come in and the iodoform is not staying in place anymore. She has no issues or complaints today. She denies signs of infection. 4/24; patient presents for follow-up. She saw Dr. Carlene Coria, plastic surgery to discuss potential skin graft/substitute placement. At this time he thinks that the skin graft would likely not take. He is in agreement with trying a wound VAC. Patient has been using Hydrofera Blue dressing  changes with no issues. She denies signs of infection. She reports improvement in wound healing. 5/1; patient presents for follow-up. Unfortunately patient did not have insurance when we ran for the pico. There is an assistance program and we are trying to get this accommodated for the patient. In the meantime she has been using Hydrofera Blue without any issues. She denies signs of infection. 5/8; patient presents for follow-up. We have not heard back if pico is covered by her insurance. She has been using collagen to the wound bed over the past week. She denies signs of infection. 5/18; patient presents for follow-up. She has been using collagen to the wound bed without issues. Again we have not heard if pico is covered by her insurance. She has no issues or complaints today. 5/23; patient presents for follow-up. She has been using collagen to the wound bed. She has no issues or complaints today. She obtained the wound VAC from [facility] and brought this in today. She denies signs of infection. 6/1; patient presents for follow-up. She has been using the wound VAC for the past week. She has had this changed twice since she was last here. She reports more maceration to the periwound. She denies signs of infection. 6/7; right TMA site. There are 2 wounds 0 separated by a bridge of healed tissue. The more lateral area has undermining. Both areas have healthy looking granulation at the base but relative the size of the wound is fairly deep. There is no exposed bone no evidence of infection. Her wound VAC was put on hold last week because of surrounding skin maceration she has been using collagen this week. She has a modified shoe 6/12; patient presents for follow-up. Last week the wound VAC was reinitiated. She had no issues with the wound VAC itself. Today she has maceration again noted to the surrounding skin. She denies signs of infection. 6/27; patient presents for follow-up. She has been using Medihoney  to the wound bed. We took a break from the wound VAC because the periwound was macerated. She still has some areas of maceration to the distal foot where there is a callus. She currently denies signs of infection. 7/11; patient presents for follow-up. She has been using Medihoney to the wound bed. She has developed some increased warmth and erythema to the lateral aspect of the right foot. She states this is occurred over the past week and there is increased pain. No drainage noted. 7/17; patient presents for follow-up. She has been using Medihoney to the wound bed. She completed her course of Bactrim. She reports improvement in symptoms. 7/24; Patient presents for follow up. She has been using Medihoney to the wound bed without issues. She completed another course of Bactrim. She reports improvement in her symptoms but still has some mild tenderness to the medial aspect of the foot. 7/31; Patient presents for follow-up. She has been using Medihoney and Dakin's to the wound bed. She denies signs  of infection. 8/14; patient presents for follow-up. She has been using Dakin's wet-to-dry packing strips to the right medial aspect of the amputation site and Medihoney to the anterior site. She has no issues or complaints today. She has started physical therapy. She denies signs of infection. 8/29; patient presents for follow-up. She has been using Dakin's wet-to-dry packing strips to the right medial aspect of the amputation site however this is becoming more difficult to place. She did report that she had increased redness and swelling to that site and developed some drainage. It has resolved. She continues with physical therapy. 9/8; patient presents for follow-up. We have been using silver alginate to the tunneled area. She completed her course of antibiotics. She reports no pain, increased swelling or erythema. 9/21; patient presents for follow-up. She has been using gentamicin to the tunneled area. She  has no issues or complaints today. She denies signs of infection. 10/2; patient presents for follow-up. She is scheduled to have her CT scan on 10/9. She currently denies signs of infection. She denies increased warmth, erythema or purulent drainage from the wound bed. She has been using Dakin's wet-to-dry dressings. 10/12; patient presents for follow-up. She had her CT scan on 10/9 that showed A small irregular rim-enhancing fluid pocket communicating to the overlying soft tissues of the sinus tract compatible with a small abscess. Currently she denies systemic signs of infection. She has been doing Dakin's wet-to-dry packing strips but it is hard for her to pack into the narrow opening. 10/30; patient presents for follow-up. Since last clinic visit she has had OR debridement of her Right foot by Dr. Odis Hollingshead Due to abscess noted on CT She has . been using iodoform packing. 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 She will complete 2 weeks of this and continue with oral amoxicillin for the next 6 to 12 months. She follows with Dr. Luciana Axe for this. She currently denies signs of infection. 11/13; patient presents for follow-up. Patient has been using silver alginate with gentamicin to the wound bed. She has no issues or complaints today she. She reports improvement in wound healing. 12/4; patient presents for follow-up. She has been using silver alginate and gentamicin to the wound bed. She has no issues or complaints today. 12/8; patient presents for follow-up. She has been using silver alginate with gentamicin to the wound bed. She presents for her first cast placement. She will be back early next week for her obligatory cast change. 12/12; patient presents for follow-up. We have been using collagen with antibiotic ointment to the wound bed under a total contact cast. She has tolerated this well. She is improved in wound healing. 12/18; patient presents for  follow-up. We have been using collagen with antibiotic ointment under the total contact cast. She has no complaints today. 1/2; The cast was placed at last clinic visit however patient missed her follow-up and this was taken off last week at a nurse visit. Patient has been using packing strips to the medial narrowed wound. She has noted no drainage. 1/22; patient presents for follow-up. She has been keeping the area covered. She reports no drainage. She has had no issues to the previous wound site. She denies any signs of infection including increased warmth, erythema or purulent drainage. 08/08/2022 Ms. Athenia Toor is a 42 year old female with a past medical history of insulin-dependent uncontrolled type 2 diabetes with last hemoglobin A1c of 8.8, chronic osteomyelitis of the right foot status post  transmetatarsal amputation on 12/18/2020 that has been previously treated for surgical dehiscence of this site in our clinic. JOULE, SWARTZBAUGH ([id number]) [id number] Page 7 of 10 She presents today 6 months after discharge from our clinic due to healed right foot wound with now reopening of the previous surgical site. She reports chronic pain to the site however denies purulent drainage, increased warmth or erythema. She noticed the wound opening about 1 month ago. She has been doing physical therapy without significant issues. She has inserts to her shoe however she has been advised to follow-up with [employer] for new custom inserts by her orthopedic surgeon now that she has a new wound. She states she will try and do this. She has been using antibiotic ointment to the wound bed. 8/1; patient presents for follow-up. She has been taking her antibiotics without issues. She has been using Dakin's wet-to-dry dressings to the wound bed. The wound is smaller. She denies systemic signs of infection. 8/13; patient presents for follow-up. She had ABIs completed that were normal. On the  right it was 1.02 and on the left was 0.99. She has been using silver alginate to the wound bed with gentamicin ointment. She states it is hard to pack the wound bed. She currently denies signs of infection. 8/29; patient presents for follow-up. She has been using collagen with antibiotic ointment to the wound bed. Wound is stable. She denies signs of infection. 9/5; patient presents for follow-up. She has been using silver alginate to the wound bed. Wound is slightly wider. She denies signs of infection. 9/12; patient presents for follow-up. She has been using silver alginate to the wound bed. Wound is slightly smaller today. 9/19; right foot in the setting of a TMA over the first metatarsal head. She has thick callus around this area small fissured wound which actually looks like it is attempting to close. She has a offloading shoe I am not sure how much activity she is in. I did not have a lot of time to see her today because of transportation 9/26; right foot in the setting of a previous TMA. The wound is over the plantar first metatarsal head. This is a fissured wound with thick callus around the margins. It is small in terms of overall surface area but has a depth of about 0.6 cm there was no palpable bone. She has a history of chronic osteomyelitis although she has been treated for this I have reviewed CT scans from 2023 also an MRI from 10/26/2021. Neither imaging study showed osteomyelitis in this area although the CT scan suggested osteomyelitis in the fourth metatarsal head. There has been problems with abscesses communicating with the wound although there does not appear to be purulent drainage currently 10/3; right foot at the TMA site. Small probing wound. I changed her to Iodosorb with gauze last time. May be some improvement. She has a depth of about 0.6 cm but there is no palpable bone. She has been previously treated for chronic osteomyelitis in this area. 10/17; this is a patient  with a probing wound at the right TMA site. Underlying osteomyelitis previously treated earlier this year. I been using Iodosorb to try and help constrict this wound however today she comes in with a larger circumference a larger tunnel and this probably sits right on top of bone. Also worrisome is there was a streaking dark area just caudal to the wound on the dorsal surface of the foot. The patient is complaining of more pain but  no fever. 10/24; culture of this area [bone scraping] revealed Acinetobacter and 2 coag negative staph. I am going to give her Septra DS 1 p.o. twice daily for 2 weeks. I think it would be simplistic to call this a bone culture but it certainly was a deep culture. An x-ray was done but that has not been reported yet. I had wanted to get lab work including a CBC with differential ESR and C-reactive protein but I am not actually sure that got done last week. We have been using gent and silver alginate. 11/7; patient is still taking trimethoprim/sulfamethoxazole for the assessment of bacterial identified last week. Lab work revealed a C-reactive protein of 4.1 which is elevated from 1.7 but not dramatic. White count was normal at 8.4 x-ray of the right foot revealed minimal new lucency and cortical erosion within the adjacent distal plantar aspect of the metatarsals concerning for acute osteomyelitis early. 11/19; the patient has completed her Septra DS which I gave to her for 2 weeks. She has her MRI tomorrow but then we will have the obligatory 5 to 10-day/workday wait to get a report. I am going to resume her Septra DS for a further 2 weeks. Then decide on additional antibiotics, infectious disease etc. 12/5; the patient has completed the Septra DS for the originally cultured as Enterobacter on a bone scraping. Her MRI somewhat surprisingly showed status post amputation of the 1st through 5th metatarsal base but no evidence of osteomyelitis. Surrounding skin thickening with  mild edema could represent cellulitis. Previous x- ray showed the suggestion of bone erosion although this was not verified by her MRI. 12/9; patient comes in today for follow-up. She tolerated the total contact cast well. The areas on the right TMA laterally. Medially she has undermining. What I can see of the granulation looks healthy. We are using Prisma and we placed the total contact cast again last week Objective Constitutional Sitting or standing Blood Pressure is within target range for patient.. Pulse regular and within target range for patient.Marland Kitchen Respirations regular, non-labored and within target range.Karie Schwalbe emperature is normal and within the target range for the patient.Marland Kitchen Appears in no distress. Vitals Time Taken: 1:00 PM, Height: 69 in, Weight: 330 lbs, BMI: 48.7, Temperature: 98.7 F, Pulse: 101 bpm, Respiratory Rate: 20 breaths/min, Blood Pressure: 126/81 mmHg. General Notes: Wound exam; medial first metatarsal on the right at the amputation site. The wound has depth and undermining this week. I am not sure we completely identified this last time. No evidence of surrounding infection Integumentary (Hair, Skin) Wound #4 status is Open. Original cause of wound was Shear/Friction. The date acquired was: 07/08/2022. The wound has been in treatment 19 weeks. The wound is located on the Right Amputation Site - Transmetatarsal. The wound measures 1.3cm length x 0.7cm width x 0.7cm depth; 0.715cm^2 area and 0.5cm^3 volume. There is Fat Layer (Subcutaneous Tissue) exposed. There is no tunneling noted, however, there is undermining starting at 12:00 and ending at 6:00 with a maximum distance of 1.4cm. There is a medium amount of serosanguineous drainage noted. The wound margin is thickened. There is large (67-100%) red, pink granulation within the wound bed. There is a small (1-33%) amount of necrotic tissue within the wound bed including Adherent Slough. The periwound skin appearance had no  abnormalities noted for moisture. The periwound skin appearance had no abnormalities noted for color. The periwound skin appearance exhibited: Callus. The periwound skin appearance did not exhibit: Crepitus, Excoriation, Induration, Rash, Scarring. Periwound  temperature was noted as No Abnormality. The periwound has tenderness on palpation. Assessment UNICA, KRENZEL ([id number]) [id number] Page 8 of 10 Active Problems ICD-10 Non-pressure chronic ulcer of other part of right foot with fat layer exposed Type 2 diabetes mellitus with foot ulcer Other chronic osteomyelitis, right ankle and foot Procedures Wound #4 Pre-procedure diagnosis of Wound #4 is a Diabetic Wound/Ulcer of the Lower Extremity located on the Right Amputation Site - Transmetatarsal . There was a Total Contact Cast Procedure by Maxwell Caul., MD. Post procedure Diagnosis Wound #4: Same as Pre-Procedure Notes: size 4. Plan 2. Underminin Paragraph Font Family Font Size g area laterally however the real concern here is stimulating granulation. 3. She has Medicaid not eligible for a skin sub- Follow-up Appointments: Return Appointment in 1 week. - Dr. Leanord Hawking 12/31/2022 230pm (already schedule) T contact cast otal Return Appointment in 2 weeks. - Dr. Leanord Hawking (Front office to schedule) total contact cast Anesthetic: (In clinic) Topical Lidocaine 5% applied to wound bed Bathing/ Shower/ Hygiene: May shower with protection but do not get wound dressing(s) wet. Protect dressing(s) with water repellant cover (for example, large plastic bag) or a cast cover and may then take shower. Other Bathing/Shower/Hygiene Orders/Instructions: - When changing the dressing (every other day) you may get right foot wet. On days NOT changing the dressing.Keep wound/dressing dry Off-Loading: T Contact Cast to Right Lower Extremity - size 4 otal WOUND #4: - Amputation Site - Transmetatarsal Wound Laterality:  Right Cleanser: Soap and Water 1 x Per Week/30 Days Discharge Instructions: May shower and wash wound with dial antibacterial soap and water prior to dressing change. On days not changing the dressing, please keep wound/dressing dry Topical: Gentamicin 1 x Per Week/30 Days Discharge Instructions: As directed by physician Topical: Skintegrity Hydrogel 4 (oz) 1 x Per Week/30 Days Discharge Instructions: Apply hydrogel as directed Prim Dressing: Promogran Prisma Matrix, 4.34 (sq in) (silver collagen) 1 x Per Week/30 Days ary Discharge Instructions: Moisten collagen with saline or hydrogel Secondary Dressing: Woven Gauze Sponge, Non-Sterile 4x4 in 1 x Per Week/30 Days Discharge Instructions: Apply over primary dressing as directed. Secondary Dressing: Zetuvit Plus 4x8 in 1 x Per Week/30 Days Discharge Instructions: Apply over primary dressing as directed. Secured With: [employer], 4.5x3.1 (in/yd) 1 x Per Week/30 Days Discharge Instructions: Secure with Kerlix as directed. Secured With: 41M Medipore H Soft Cloth Surgical T ape, 4 x 10 (in/yd) 1 x Per Week/30 Days Discharge Instructions: Secure with tape as directed. Secured With: total contact cast size 4 1 x Per Week/30 Days Discharge Instructions: last layer applied by provider. ZYNAE, HEREK ([id number]) [id number] Page 9 of 10 1. We use Prisma backing wet to dry/Zetuvit/kerlix in a total contact cast 2. Looking for a improvement in the granulation. 3. It would be possible to unroofed the entirety of the wound but this would leave her with a much larger wound 4. She has Medicaid not eligible for a skin sub- Electronic Signature(s) Signed: 12/23/2022 4:43:54 PM By: Baltazar Najjar MD Entered By: Baltazar Najjar on 12/23/2022 14:00:34 -------------------------------------------------------------------------------- Total Contact Cast Details Patient Name: Date of Service: [name] IS, [name] 12/23/2022  12:30 PM Medical Record Number: [id number] Patient Account Number: [id number] Date of Birth/Sex: Treating RN: 1980-10-17 (42 y.o. F) Primary Care Provider: Gwinda Passe Other Clinician: Referring Provider: Treating Provider/Extender: Aurelio Brash in Treatment: 19 T Contact Cast Applied for Wound Assessment: otal Wound #4 Right Amputation Site - Transmetatarsal Performed By: Physician  Maxwell Caul., MD The following information was scribed by: Shawn Stall The information was scribed for: Baltazar Najjar Post Procedure Diagnosis Same as Pre-procedure Notes size 4 Electronic Signature(s) Signed: 12/23/2022 4:43:54 PM By: Baltazar Najjar MD Entered By: Baltazar Najjar on 12/23/2022 13:53:30 -------------------------------------------------------------------------------- SuperBill Details Patient Name: Date of Service: DA [name] Kitten, [name] 12/23/2022 Medical Record Number: [id number] Patient Account Number: [id number] Date of Birth/Sex: Treating RN: [birth year] (42 y.o. [name] Primary Care Provider: Gwinda Passe Other Clinician: Referring Provider: Treating Provider/Extender: Aurelio Brash in Treatment: 19 Diagnosis Coding ICD-10 Codes Code Description [phone] Non-pressure chronic ulcer of other part of right foot with fat layer exposed E11.621 Type 2 diabetes mellitus with foot ulcer M86.671 Other chronic osteomyelitis, right ankle and foot Facility Procedures : [place], [place] CPT4 Code: [id number] RKIA L ([id number]) Description: [phone] - APPLY TOTAL CONTACT LEG CAST ICD-10 Diagnosis Description [phone] L97.512 Non-pressure chronic ulcer of other part of right foot with fat layer exposed E11.621 Type 2 diabetes mellitus with foot ulcer Modifier: [id number] P Quantity: 1 age 3 of 69 Physician Procedures : CPT4 Code Description Modifier [id number] [phone] - WC PHYS APPLY TOTAL CONTACT CAST ICD-10  Diagnosis Description L97.512 Non-pressure chronic ulcer of other part of right foot with fat layer exposed E11.621 Type 2 diabetes mellitus with foot ulcer Quantity: 1 Electronic Signature(s) Signed: 12/23/2022 4:43:54 PM By: Baltazar Najjar MD Entered By: Baltazar Najjar on 12/23/2022 14:00:46

## 2022-12-30 ENCOUNTER — Encounter (INDEPENDENT_AMBULATORY_CARE_PROVIDER_SITE_OTHER): Payer: Self-pay | Admitting: Primary Care

## 2022-12-31 ENCOUNTER — Encounter (HOSPITAL_BASED_OUTPATIENT_CLINIC_OR_DEPARTMENT_OTHER): Payer: Medicaid Other | Admitting: Internal Medicine

## 2022-12-31 DIAGNOSIS — E11621 Type 2 diabetes mellitus with foot ulcer: Secondary | ICD-10-CM | POA: Diagnosis not present

## 2023-01-02 NOTE — Progress Notes (Signed)
[name], [name] ([id number]) [id number] Page 1 of 9 Visit Report for 12/31/2022 Arrival Information Details Patient Name: Date of Service: [name] IS, [place] [name] 12/31/2022 2:30 PM Medical Record Number: [id number] Patient Account Number: [id number] Date of Birth/Sex: Treating RN: October 18, 1980 (42 y.o. [name], [name] Primary Care Derris Millan: Gwinda Passe Other Clinician: Referring Daeron Carreno: Treating Unita Detamore/Extender: Aurelio Brash in Treatment: 20 Visit Information History Since Last Visit Added or deleted any medications: No Patient Arrived: Wheel Chair Any new allergies or adverse reactions: No Arrival Time: 14:40 Had a fall or experienced change in No Accompanied By: self activities of daily living that may affect Transfer Assistance: Manual risk of falls: Patient Identification Verified: Yes Signs or symptoms of abuse/neglect since last visito No Secondary Verification Process Completed: Yes Hospitalized since last visit: No Patient Requires Transmission-Based Precautions: No Implantable device outside of the clinic excluding No Patient Has Alerts: Yes cellular tissue based products placed in the center Patient Alerts: Patient on Blood Thinner since last visit: Eliquis Has Dressing in Place as Prescribed: Yes ABI R 1.02 (08/21/22) Has Footwear/Offloading in Place as Prescribed: Yes ABI L 0.99 (08/21/22) Right: T Contact Cast otal Pain Present Now: Yes Electronic Signature(s) Signed: 01/01/2023 5:35:56 PM By: Shawn Stall RN, BSN Entered By: Shawn Stall on 12/31/2022 14:59:31 -------------------------------------------------------------------------------- Encounter Discharge Information Details Patient Name: Date of Service: [name], [name] 12/31/2022 2:30 PM Medical Record Number: [id number] Patient Account Number: [id number] Date of Birth/Sex: Treating RN: [birth year] (42 y.o. [name], [name] Primary Care  Zakya Halabi: Gwinda Passe Other Clinician: Referring Makennah Omura: Treating Bronda Alfred/Extender: Aurelio Brash in Treatment: 20 Encounter Discharge Information Items Discharge Condition: Stable Ambulatory Status: Wheelchair Discharge Destination: Home Transportation: Private Auto Accompanied By: self Schedule Follow-up Appointment: Yes Clinical Summary of Care: Electronic Signature(s) Signed: 01/01/2023 5:35:56 PM By: Shawn Stall RN, BSN Entered By: Shawn Stall on 12/31/2022 15:16:16 Karr, Sharol Harness ([id number]) [id number] Page 2 of 9 -------------------------------------------------------------------------------- Lower Extremity Assessment Details Patient Name: Date of Service: [name] IS, [name] 12/31/2022 2:30 PM Medical Record Number: [id number] Patient Account Number: [id number] Date of Birth/Sex: Treating RN: [birth year] (42 y.o. [name] Primary Care Cythnia Osmun: Gwinda Passe Other Clinician: Referring Altariq Goodall: Treating Roxy Filler/Extender: Aurelio Brash in Treatment: 20 Edema Assessment Assessed: Kyra Searles: No] Franne Forts: Yes] Edema: [Left: Ye] [Right: s] Calf Left: Right: Point of Measurement: 31 cm From Medial Instep 49.5 cm Ankle Left: Right: Point of Measurement: 9 cm From Medial Instep 30.5 cm Vascular Assessment Pulses: Dorsalis Pedis Palpable: [Right:Yes] Extremity colors, hair growth, and conditions: Extremity Color: [Right:Hyperpigmented] Hair Growth on Extremity: [Right:No] Temperature of Extremity: [Right:Warm] Capillary Refill: [Right:< 3 seconds] Dependent Rubor: [Right:No] Blanched when Elevated: [Right:No Yes] Electronic Signature(s) Signed: 01/01/2023 5:35:56 PM By: Shawn Stall RN, BSN Entered By: Shawn Stall on 12/31/2022 15:00:07 -------------------------------------------------------------------------------- Multi Wound Chart Details Patient Name: Date of  Service: [name], [name] 12/31/2022 2:30 PM Medical Record Number: [id number] Patient Account Number: [id number] Date of Birth/Sex: Treating RN: 1980-08-12 (42 y.o. F) Primary Care [name]: Gwinda Passe Other Clinician: Referring [name]: Treating Charmain Diosdado/Extender: Aurelio Brash in Treatment: 20 Vital Signs Height(in): 69 Pulse(bpm): 87 Weight(lbs): 330 Blood Pressure(mmHg): 114/78 Body Mass Index(BMI): 48.7 Temperature(F): 98.6 Respiratory Rate(breaths/min): 20 [4:Photos:] [N/A:N/A] Right Amputation Site - N/A N/A Wound Location: Transmetatarsal Shear/Friction N/A N/A Wounding Event: Diabetic Wound/Ulcer of the Lower N/A N/A Primary Etiology: Extremity Dehisced Wound N/A N/A Secondary Etiology: Deep Vein Thrombosis, Hypertension,  N/A N/A Comorbid History: Type II Diabetes, Osteomyelitis, Neuropathy 07/08/2022 N/A N/A Date Acquired: 20 N/A N/A Weeks of Treatment: Open N/A N/A Wound Status: No N/A N/A Wound Recurrence: 1x0.5x1 N/A N/A Measurements L x W x D (cm) 0.393 N/A N/A A (cm) : rea 0.393 N/A N/A Volume (cm) : -78.60% N/A N/A % Reduction in A rea: -123.30% N/A N/A % Reduction in Volume: 12 Starting Position 1 (o'clock): 6 Ending Position 1 (o'clock): 0.7 Maximum Distance 1 (cm): Yes N/A N/A Undermining: Grade 3 N/A N/A Classification: Large N/A N/A Exudate A mount: Serosanguineous N/A N/A Exudate Type: red, brown N/A N/A Exudate Color: Thickened N/A N/A Wound Margin: Large (67-100%) N/A N/A Granulation A mount: Red, Pink N/A N/A Granulation Quality: Small (1-33%) N/A N/A Necrotic A mount: Fat Layer (Subcutaneous Tissue): Yes N/A N/A Exposed Structures: Fascia: No Tendon: No Muscle: No Joint: No Bone: No Small (1-33%) N/A N/A Epithelialization: Callus: Yes N/A N/A Periwound Skin Texture: Excoriation: No Induration: No Crepitus: No Rash: No Scarring: No Maceration: Yes N/A N/A Periwound  Skin Moisture: Dry/Scaly: No Atrophie Blanche: No N/A N/A Periwound Skin Color: Cyanosis: No Ecchymosis: No Erythema: No Hemosiderin Staining: No Mottled: No Pallor: No Rubor: No No Abnormality N/A N/A Temperature: Yes N/A N/A Tenderness on Palpation: T Contact Cast otal N/A N/A Procedures Performed: Treatment Notes Wound #4 (Amputation Site - Transmetatarsal) Wound Laterality: Right Cleanser Soap and Water Discharge Instruction: May shower and wash wound with dial antibacterial soap and water prior to dressing change. On days not changing the dressing, please keep wound/dressing dry Peri-Wound Care Topical Gentamicin Discharge Instruction: As directed by physician Skintegrity Hydrogel 4 (oz) Discharge Instruction: Apply hydrogel as directed Primary Dressing Promogran Prisma Matrix, 4.34 (sq in) (silver collagen) Discharge Instruction: Moisten collagen with saline or hydrogel Bacorn, Chana L ([id number]) [id number] Page 4 of 9 Secondary Dressing ABD Pad, 8x10 Discharge Instruction: Apply over primary dressing as directed. Woven Gauze Sponge, Non-Sterile 4x4 in Discharge Instruction: Apply over primary dressing as directed. Zetuvit Plus 4x8 in Discharge Instruction: Apply over primary dressing as directed. Secured With [employer], 4.5x3.1 (in/yd) Discharge Instruction: Secure with Kerlix as directed. 51M Medipore H Soft Cloth Surgical T ape, 4 x 10 (in/yd) Discharge Instruction: Secure with tape as directed. total contact cast size 4 Discharge Instruction: last layer applied by Alea Ryer. Compression Wrap Compression Stockings Add-Ons Electronic Signature(s) Signed: 12/31/2022 4:32:13 PM By: Baltazar Najjar MD Entered By: Baltazar Najjar on 12/31/2022 16:02:00 -------------------------------------------------------------------------------- Multi-Disciplinary Care Plan Details Patient Name: Date of Service: [name] IS, [name]  12/31/2022 2:30 PM Medical Record Number: [id number] Patient Account Number: [id number] Date of Birth/Sex: Treating RN: [birth year] (42 y.o. [name] Primary Care Cheyrl Buley: Gwinda Passe Other Clinician: Referring Nasire Reali: Treating Erielle Gawronski/Extender: Aurelio Brash in Treatment: 20 Active Inactive Nutrition Nursing Diagnoses: Impaired glucose control: actual or potential Goals: Patient/caregiver agrees to and verbalizes understanding of need to obtain nutritional consultation Date Initiated: 08/08/2022 Date Inactivated: 12/31/2022 Target Resolution Date: 01/13/2024 Goal Status: Met Patient/caregiver will maintain therapeutic glucose control Date Initiated: 08/08/2022 Target Resolution Date: 01/13/2024 Goal Status: Active Interventions: Assess HgA1c results as ordered upon admission and as needed Provide education on elevated blood sugars and impact on wound healing Provide education on nutrition Treatment Activities: Obtain HgA1c : 08/08/2022 Patient referred to Primary Care Physician for further nutritional evaluation : 08/08/2022 Notes: Pain, Acute or Chronic Leckrone, Delicia L ([id number]) [id number] Page 5 of 9 Nursing Diagnoses: Pain, acute or chronic: actual or potential  Potential alteration in comfort, pain Goals: Patient will verbalize adequate pain control and receive pain control interventions during procedures as needed Date Initiated: 08/08/2022 Date Inactivated: 12/31/2022 Target Resolution Date: 01/13/2024 Goal Status: Met Patient/caregiver will verbalize comfort level met Date Initiated: 08/08/2022 Target Resolution Date: 01/13/2024 Goal Status: Active Interventions: Complete pain assessment as per visit requirements Encourage patient to take pain medications as prescribed Provide education on pain management Treatment Activities: Administer pain control measures as ordered :  08/08/2022 Notes: Wound/Skin Impairment Nursing Diagnoses: Knowledge deficit related to ulceration/compromised skin integrity Goals: Patient/caregiver will verbalize understanding of skin care regimen Date Initiated: 08/08/2022 Date Inactivated: 12/31/2022 Target Resolution Date: 01/13/2024 Goal Status: Met Ulcer/skin breakdown will heal within 14 weeks Date Initiated: 08/08/2022 Target Resolution Date: 01/13/2024 Goal Status: Active Interventions: Assess patient/caregiver ability to perform ulcer/skin care regimen upon admission and as needed Assess ulceration(s) every visit Provide education on ulcer and skin care Treatment Activities: Skin care regimen initiated : 08/08/2022 Topical wound management initiated : 08/08/2022 Notes: Electronic Signature(s) Signed: 01/01/2023 5:35:56 PM By: Shawn Stall RN, BSN Entered By: Shawn Stall on 12/31/2022 15:02:23 -------------------------------------------------------------------------------- Pain Assessment Details Patient Name: Date of Service: [name], [name] 12/31/2022 2:30 PM Medical Record Number: [id number] Patient Account Number: [id number] Date of Birth/Sex: Treating RN: 01-28-1980 (42 y.o. [name] Primary Care Alizeh Madril: Gwinda Passe Other Clinician: Referring Tamarcus Condie: Treating Ilka Lovick/Extender: Aurelio Brash in Treatment: 20 Active Problems Location of Pain Severity and Description of Pain Patient Has Paino Yes Site Locations Pain Location: ZORAH, BUSSINGER ([id number]) [id number] Page 6 of 9 Pain Location: Pain in Ulcers Rate the pain. Current Pain Level: 2 Pain Management and Medication Current Pain Management: Medication: No Cold Application: No Rest: No Massage: No Activity: No T.E.N.S.: No Heat Application: No Leg drop or elevation: No Is the Current Pain Management Adequate: Adequate How does your wound impact your activities of daily  livingo Sleep: No Bathing: No Appetite: No Relationship With Others: No Bladder Continence: No Emotions: No Bowel Continence: No Work: No Toileting: No Drive: No Dressing: No Hobbies: No Psychologist, prison and probation services) Signed: 01/01/2023 5:35:56 PM By: Shawn Stall RN, BSN Entered By: Shawn Stall on 12/31/2022 14:59:52 -------------------------------------------------------------------------------- Patient/Caregiver Education Details Patient Name: Date of Service: [name], [name] 12/17/2024andnbsp2:30 PM Medical Record Number: [id number] Patient Account Number: [id number] Date of Birth/Gender: Treating RN: 31-May-1980 (42 y.o. [name] Primary Care Physician: Gwinda Passe Other Clinician: Referring Physician: Treating Physician/Extender: Aurelio Brash in Treatment: 20 Education Assessment Education Provided To: Patient Education Topics Provided Elevated Blood Sugar/ Impact on Healing: Handouts: Elevated Blood Sugars: How Do They Affect Wound Healing Methods: Explain/Verbal Responses: Reinforcements needed Electronic Signature(s) Signed: 01/01/2023 5:35:56 PM By: Shawn Stall RN, BSN Entered By: Shawn Stall on 12/31/2022 15:02:46 Pralle, Sharol Harness ([id number]) [id number] Page 7 of 9 -------------------------------------------------------------------------------- Wound Assessment Details Patient Name: Date of Service: [name] IS, [name] 12/31/2022 2:30 PM Medical Record Number: [id number] Patient Account Number: [id number] Date of Birth/Sex: Treating RN: 03-14-1980 (42 y.o. 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 Secondary Dehisced Wound Wounding Event: Shear/Friction Etiology: Date Acquired: 07/08/2022 Wound Status: Open Weeks Of Treatment: 20 Comorbid Deep Vein Thrombosis, Hypertension, Type II Diabetes, Clustered Wound: No History: Osteomyelitis, Neuropathy Photos Wound  Measurements Length: (cm) 1 Width: (cm) 0.5 Depth: (cm) 1 Area: (cm) 0.393 Volume: (cm) 0.393 % Reduction in Area: -78.6% % Reduction in Volume: -123.3% Epithelialization: Small (1-33%) Tunneling: No Undermining: Yes Starting Position (o'clock): 12 Ending Position (o'clock): 6 Maximum Distance: (cm) 0.7 Wound Description Classification: Grade 3 Wound Margin: Thickened Exudate Amount: Large Exudate Type: Serosanguineous Exudate Color: red, brown Foul Odor After Cleansing: No Slough/Fibrino Yes Wound Bed Granulation Amount: Large (67-100%) Exposed Structure Granulation Quality: Red, Pink Fascia Exposed: No Necrotic Amount: Small (1-33%) Fat Layer (Subcutaneous Tissue) Exposed: Yes Necrotic Quality: Adherent Slough Tendon Exposed: No Muscle Exposed: No Joint Exposed: No Bone Exposed: No Periwound Skin Texture Texture Color No Abnormalities Noted: No No Abnormalities Noted: Yes Callus: Yes Temperature / Pain Crepitus: No Temperature: No Abnormality Excoriation: No Tenderness on Palpation: Yes Induration: No Rash: No Scarring: No Moose, Fatin L ([id number]) [id number] Page 8 of 9 Moisture No Abnormalities Noted: Yes Treatment Notes Wound #4 (Amputation Site - Transmetatarsal) Wound Laterality: Right Cleanser Soap and Water Discharge Instruction: May shower and wash wound with dial antibacterial soap and water prior to dressing change. On days not changing the dressing, please keep wound/dressing dry Peri-Wound Care Topical Gentamicin Discharge Instruction: As directed by physician Skintegrity Hydrogel 4 (oz) Discharge Instruction: Apply hydrogel as  directed Primary Dressing Promogran Prisma Matrix, 4.34 (sq in) (silver collagen) Discharge Instruction: Moisten collagen with saline or hydrogel Secondary Dressing ABD Pad, 8x10 Discharge Instruction: Apply over primary dressing as directed. Woven Gauze Sponge, Non-Sterile 4x4 in Discharge Instruction: Apply over primary dressing as directed. Zetuvit Plus 4x8 in Discharge Instruction: Apply over primary dressing as directed. Secured With [employer], 4.5x3.1 (in/yd) Discharge Instruction: Secure with Kerlix as directed. 40M Medipore H Soft Cloth Surgical T ape, 4 x 10 (in/yd) Discharge Instruction: Secure with tape as directed. total contact cast size 4 Discharge Instruction: last layer applied by Ilir Mahrt. Compression Wrap Compression Stockings Add-Ons Electronic Signature(s) Signed: 01/01/2023 5:35:56 PM By: Shawn Stall RN, BSN Entered By: Shawn Stall on 12/31/2022 15:12:40 -------------------------------------------------------------------------------- Vitals Details Patient Name: Date of Service: [name], [name] 12/31/2022 2:30 PM Medical Record Number: [id number] Patient Account Number: [id number] Date of Birth/Sex: Treating RN: Dec 18, 1980 (42 y.o. [name], [name] Primary Care Omer Monter: Gwinda Passe Other Clinician: Referring Reighn Kaplan: Treating Jaleah Lefevre/Extender: Aurelio Brash in Treatment: 20 Vital Signs Time Taken: 14:59 Temperature (F): 98.6 Height (in): 69 Pulse (bpm): 87 Weight (lbs): 330 Respiratory Rate (breaths/min): 20 Body Mass Index (BMI): 48.7 Blood Pressure (mmHg): 114/78 Reference Range: 80 - 120 mg / dl Colclasure, Ervin L ([id number]) [id number] Page 9 of 9 Electronic Signature(s) Signed: 01/01/2023 5:35:56 PM By: Shawn Stall RN, BSN Entered By: Shawn Stall on 12/31/2022 14:59:42

## 2023-01-02 NOTE — Progress Notes (Signed)
[name] ([id number]) [id number] Page 1 of 10 Visit Report for 12/31/2022 HPI Details Patient Name: Date of Service: [name], [place] [name]. 12/31/2022 2:30 PM Medical Record Number: [id number] Patient Account Number: [id number] Date of Birth/Sex: Treating RN: 05-22-1980 (42 y.o. F) Primary Care Provider: Gwinda Passe Other Clinician: Referring Provider: Treating Provider/Extender: Aurelio Brash in Treatment: 20 History of Present Illness HPI Description: Admission 01/22/2021 [name] [name] a 42 year old female with a past medical history of insulin-dependent uncontrolled type 2 diabetes with last hemoglobin A1c of 13.5, osteomyelitis of the right foot status post transmetatarsal amputation on 12/18/2020 that presents to the clinic for right foot wound. She has had dehiscence of the surgical site. [name] using wet-to-dry dressings. She has a PICC line and receiving IV ceftriaxone daily for her osteomyelitis. There [name] an end date of 01/27/2021. [name] also taking oral metronidazole. She currently denies systemic signs of infection. 1/19; patient presents for follow-up. She was diagnosed with a DVT to the right lower extremity 2 days ago. [name] on Eliquis now. [name] scheduled to see her infectious disease doctor tomorrow. She has been using Dakin's wet-to-dry dressings. She denies systemic signs of infection. 1/26; patient presents for follow-up. She saw infectious disease on 1/21 started on Augmentin. Her PICC line and IV ceftriaxone was discontinued. Patient reports stability to her wound. She has been using Dakin's wet-to-dry dressings. She currently denies systemic signs of infection. 2/3; patient presents for follow-up. She continues to use Dakin's wet-to-dry dressings to the wound bed. She saw Dr. Manson Passey with infectious disease yesterday and [name] continuing Augmentin. T entative end date [name] 2/16. Patient reports  following up with orthopedics. She states there [name] no further plan from them. She currently denies systemic signs of infection. 2/10; patient presents for follow-up. She continues to use Dakin's wet to dry dressings. [name] scheduled to have her MRI done on 2/14. She states that she had pain to the debridement site from last clinic visit and declines debridement today. She denies systemic signs of infection. She continues to have yellow thick drainage. 2/20; patient presents for follow-up. She continues to use Dakin's wet-to-dry dressings. She obtained her MRI. The results showed an abscess and [name] scheduled to see her orthopedic surgeon on 2/23. She saw infectious disease 2/17 and her antibiotics were extended. She currently denies systemic signs of infection. 3/6; patient presents for follow-up. She had debridement and irrigation of her foot on 03/10/2021 due to abscess noted on MRI. She was started on IV ceftriaxone and oral Flagyl. She has no issues or complaints today. She has been using iodoform packing to the tunnel and Dakin's wet-to-dry to the opening. 03/26/2021: She continues on IV ceftriaxone and oral metronidazole. She has follow-up with infectious disease tomorrow. No significant issues or complaints today. Her mother continues to help her with her wound dressing, using iodoform packing strips into the tunnel and Dakin's to the open portion of the wound. 3/20; patient presents for follow-up. She continues to be on IV ceftriaxone in oral metronidazole. She has been using iodoform to the tunnel and Dakin's wet-to- dry to the open wound. She denies signs of infection. 3/27; patient presents for follow-up. She no longer has a PICC line. She has been using iodoform to the tunnel and Dakin's wet-to-dry to the open wound. She reports improvement in wound healing. She denies signs of infection. 4/3; patient presents for follow-up. She states she has been using Hydrofera  Blue to the open wound  and iodoform packing to the tunnel without any issues. She denies signs of infection. 4/18; patient presents for follow-up. She saw infectious disease on 4/11. She has finished her oral antibiotics and completed a total of 6 weeks of antibiotics (this includes IV as well). No further antibiotics needed. She has been using Hydrofera Blue and iodoform packing. She states that the tunneled area has come in and the iodoform [name] not staying in place anymore. She has no issues or complaints today. She denies signs of infection. 4/24; patient presents for follow-up. She saw Dr. Carlene Coria, plastic surgery to discuss potential skin graft/substitute placement. At this time he thinks that the skin graft would likely not take. He [name] in agreement with trying a wound VAC. Patient has been using Hydrofera Blue dressing changes with no issues. She denies signs of infection. She reports improvement in wound healing. 5/1; patient presents for follow-up. Unfortunately patient did not have insurance when we ran for the pico. There [name] an assistance program and we are trying to get this accommodated for the patient. In the meantime she has been using Hydrofera Blue without any issues. She denies signs of infection. 5/8; patient presents for follow-up. We have not heard back if pico [name] covered by her insurance. She has been using collagen to the wound bed over the past week. She denies signs of infection. 5/18; patient presents for follow-up. She has been using collagen to the wound bed without issues. Again we have not heard if pico [name] covered by her insurance. She has no issues or complaints today. 5/23; patient presents for follow-up. She has been using collagen to the wound bed. She has no issues or complaints today. She obtained the wound VAC from [facility] and brought this in today. She denies signs of infection. 6/1; patient presents for follow-up. She has been using the wound VAC for the past week. She has had this changed  twice since she was last here. She reports more maceration to the periwound. She denies signs of infection. 6/7; right TMA site. There are 2 wounds 0 separated by a bridge of healed tissue. The more lateral area has undermining. Both areas have healthy looking granulation at the base but relative the size of the wound [name] fairly deep. There [name] no exposed bone no evidence of infection. KAYLIYAH, LANDEN ([id number]) [id number] Page 2 of 10 Her wound VAC was put on hold last week because of surrounding skin maceration she has been using collagen this week. She has a modified shoe 6/12; patient presents for follow-up. Last week the wound VAC was reinitiated. She had no issues with the wound VAC itself. Today she has maceration again noted to the surrounding skin. She denies signs of infection. 6/27; patient presents for follow-up. She has been using Medihoney to the wound bed. We took a break from the wound VAC because the periwound was macerated. She still has some areas of maceration to the distal foot where there [name] a callus. She currently denies signs of infection. 7/11; patient presents for follow-up. She has been using Medihoney to the wound bed. She has developed some increased warmth and erythema to the lateral aspect of the right foot. She states this [name] occurred over the past week and there [name] increased pain. No drainage noted. 7/17; patient presents for follow-up. She has been using Medihoney to the wound bed. She completed her course of Bactrim. She reports improvement in symptoms. 7/24; Patient presents for follow  up. She has been using Medihoney to the wound bed without issues. She completed another course of Bactrim. She reports improvement in her symptoms but still has some mild tenderness to the medial aspect of the foot. 7/31; Patient presents for follow-up. She has been using Medihoney and Dakin's to the wound bed. She denies signs of infection. 8/14; patient  presents for follow-up. She has been using Dakin's wet-to-dry packing strips to the right medial aspect of the amputation site and Medihoney to the anterior site. She has no issues or complaints today. She has started physical therapy. She denies signs of infection. 8/29; patient presents for follow-up. She has been using Dakin's wet-to-dry packing strips to the right medial aspect of the amputation site however this [name] becoming more difficult to place. She did report that she had increased redness and swelling to that site and developed some drainage. It has resolved. She continues with physical therapy. 9/8; patient presents for follow-up. We have been using silver alginate to the tunneled area. She completed her course of antibiotics. She reports no pain, increased swelling or erythema. 9/21; patient presents for follow-up. She has been using gentamicin to the tunneled area. She has no issues or complaints today. She denies signs of infection. 10/2; patient presents for follow-up. [name] scheduled to have her CT scan on 10/9. She currently denies signs of infection. She denies increased warmth, erythema or purulent drainage from the wound bed. She has been using Dakin's wet-to-dry dressings. 10/12; patient presents for follow-up. She had her CT scan on 10/9 that showed A small irregular rim-enhancing fluid pocket communicating to the overlying soft tissues of the sinus tract compatible with a small abscess. Currently she denies systemic signs of infection. She has been doing Dakin's wet-to-dry packing strips but it [name] hard for her to pack into the narrow opening. 10/30; patient presents for follow-up. Since last clinic visit she has had OR debridement of her Right foot by Dr. Odis Hollingshead Due to abscess noted on CT. She has been using iodoform packing. 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 She will complete 2 weeks of this and continue with oral amoxicillin  for the next 6 to 12 months. She follows with Dr. Luciana Axe for this. She currently denies signs of infection. 11/13; patient presents for follow-up. Patient has been using silver alginate with gentamicin to the wound bed. She has no issues or complaints today she. She reports improvement in wound healing. 12/4; patient presents for follow-up. She has been using silver alginate and gentamicin to the wound bed. She has no issues or complaints today. 12/8; patient presents for follow-up. She has been using silver alginate with gentamicin to the wound bed. She presents for her first cast placement. She will be back early next week for her obligatory cast change. 12/12; patient presents for follow-up. We have been using collagen with antibiotic ointment to the wound bed under a total contact cast. She has tolerated this well. [name] improved in wound healing. 12/18; patient presents for follow-up. We have been using collagen with antibiotic ointment under the total contact cast. She has no complaints today. 1/2; The cast was placed at last clinic visit however patient missed her follow-up and this was taken off last week at a nurse visit. Patient has been using packing strips to the medial narrowed wound. She has noted no drainage. 1/22; patient presents for follow-up. She has been keeping the area covered. She reports no drainage. She  has had no issues to the previous wound site. She denies any signs of infection including increased warmth, erythema or purulent drainage. 08/08/2022 [name] [name] a 42 year old female with a past medical history of insulin-dependent uncontrolled type 2 diabetes with last hemoglobin A1c of 8.8, chronic osteomyelitis of the right foot status post transmetatarsal amputation on 12/18/2020 that has been previously treated for surgical dehiscence of this site in our clinic. She presents today 6 months after discharge from our clinic due to healed right foot wound with now  reopening of the previous surgical site. She reports chronic pain to the site however denies purulent drainage, increased warmth or erythema. She noticed the wound opening about 1 month ago. She has been doing physical therapy without significant issues. She has inserts to her shoe however she has been advised to follow-up with [employer] for new custom inserts by her orthopedic surgeon now that she has a new wound. She states she will try and do this. She has been using antibiotic ointment to the wound bed. 8/1; patient presents for follow-up. She has been taking her antibiotics without issues. She has been using Dakin's wet-to-dry dressings to the wound bed. The wound [name] smaller. She denies systemic signs of infection. 8/13; patient presents for follow-up. She had ABIs completed that were normal. On the right it was 1.02 and on the left was 0.99. She has been using silver alginate to the wound bed with gentamicin ointment. She states it [name] hard to pack the wound bed. She currently denies signs of infection. 8/29; patient presents for follow-up. She has been using collagen with antibiotic ointment to the wound bed. Wound [name] stable. She denies signs of infection. 9/5; patient presents for follow-up. She has been using silver alginate to the wound bed. Wound [name] slightly wider. She denies signs of infection. 9/12; patient presents for follow-up. She has been using silver alginate to the wound bed. Wound [name] slightly smaller today. 9/19; right foot in the setting of a TMA over the first metatarsal head. She has thick callus around this area small fissured wound which actually looks like it [name] attempting to close. She has a offloading shoe I am not sure how much activity [name] in. I did not have a lot of time to see her today because of transportation 9/26; right foot in the setting of a previous TMA. The wound [name] over the plantar first metatarsal head. This [name] a fissured wound with thick callus around  the margins. It [name] small in terms of overall surface area but has a depth of about 0.6 cm there was no palpable bone. She has a history of chronic osteomyelitis although she has been treated for this I have reviewed CT scans from 2023 also an MRI from 10/26/2021. Neither imaging study showed osteomyelitis in this area although the CT scan suggested osteomyelitis in the fourth metatarsal head. There has been problems with abscesses communicating with the wound although there does not appear to be [name], [name] ([id number]) [phone].pdf Page 3 of 10 purulent drainage currently 10/3; right foot at the TMA site. Small probing wound. I changed her to Iodosorb with gauze last time. May be some improvement. She has a depth of about 0.6 cm but there [name] no palpable bone. She has been previously treated for chronic osteomyelitis in this area. 10/17; this [name] a patient with a probing wound at the right TMA site. Underlying osteomyelitis previously treated earlier this year. I been using Iodosorb to try  and help constrict this wound however today she comes in with a larger circumference a larger tunnel and this probably sits right on top of bone. Also worrisome [name] there was a streaking dark area just caudal to the wound on the dorsal surface of the foot. The patient [name] complaining of more pain but no fever. 10/24; culture of this area [bone scraping] revealed Acinetobacter and 2 coag negative staph. I am going to give her Septra DS 1 p.o. twice daily for 2 weeks. I think it would be simplistic to call this a bone culture but it certainly was a deep culture. An x-ray was done but that has not been reported yet. I had wanted to get lab work including a CBC with differential ESR and C-reactive protein but I am not actually sure that got done last week. We have been using gent and silver alginate. 11/7; patient [name] still taking trimethoprim/sulfamethoxazole for the assessment of bacterial  identified last week. Lab work revealed a C-reactive protein of 4.1 which [name] elevated from 1.7 but not dramatic. White count was normal at 8.4 x-ray of the right foot revealed minimal new lucency and cortical erosion within the adjacent distal plantar aspect of the metatarsals concerning for acute osteomyelitis early. 11/19; the patient has completed her Septra DS which I gave to her for 2 weeks. She has her MRI tomorrow but then we will have the obligatory 5 to 10- day/workday wait to get a report. I am going to resume her Septra DS for a further 2 weeks. Then decide on additional antibiotics, infectious disease etc. 12/5; the patient has completed the Septra DS for the originally cultured as Enterobacter on a bone scraping. Her MRI somewhat surprisingly showed status post amputation of the 1st through 5th metatarsal base but no evidence of osteomyelitis. Surrounding skin thickening with mild edema could represent cellulitis. Previous x-ray showed the suggestion of bone erosion although this was not verified by her MRI. 12/9; patient comes in today for follow-up. She tolerated the total contact cast well. The areas on the right TMA laterally. Medially she has undermining. What I can see of the granulation looks healthy. We are using Prisma and we placed the total contact cast again last week 12/17;. She has a area on the right TMA medially. This seems to have contracted quite a bit this week. There [name] still 1 cm of depth. No palpable bone. No purulent drainage. Again MRI did not suggest underlying osteomyelitis. She has completed the antibiotics I gave her for S Enterobacter. We put her back in a total contact cast last week using Prisma as the primary dressing Electronic Signature(s) Signed: 12/31/2022 4:32:13 PM By: Baltazar Najjar MD Entered By: Baltazar Najjar on 12/31/2022 16:06:32 -------------------------------------------------------------------------------- Physical Exam Details Patient  Name: Date of Service: [name], [name] [name]. 12/31/2022 2:30 PM Medical Record Number: [id number] Patient Account Number: [id number] Date of Birth/Sex: Treating RN: 16-Jan-1980 (42 y.o. F) Primary Care Provider: Gwinda Passe Other Clinician: Referring Provider: Treating Provider/Extender: Aurelio Brash in Treatment: 20 Constitutional Sitting or standing Blood Pressure [name] within target range for patient.. Pulse regular and within target range for patient.Marland Kitchen Respirations regular, non-labored and within target range.. Temperature [name] normal and within the target range for the patient.Marland Kitchen Appears in no distress. Notes Wound exam; medial first metatarsal head on the right TMA amputation site. This undermines laterally about 1 cm in depth. This seems to have come in somewhat at least in terms of the circumference  of the wound versus last week there [name] no palpable bone no purulence. No debridement was done. We replaced a total contact cast in the standard fashion Electronic Signature(s) Signed: 12/31/2022 4:32:13 PM By: Baltazar Najjar MD Entered By: Baltazar Najjar on 12/31/2022 16:05:55 -------------------------------------------------------------------------------- Physician Orders Details Patient Name: Date of Service: [name], [name] [name]. 12/31/2022 2:30 PM Medical Record Number: [id number] Patient Account Number: [id number] Date of Birth/Sex: Treating RN: 08/15/1980 (42 y.o. [name], [name], [name] ([id number]) [id number] Page 4 of 10 Primary Care Provider: Gwinda Passe Other Clinician: Referring Provider: Treating Provider/Extender: Aurelio Brash in Treatment: 20 Verbal / Phone Orders: No Diagnosis Coding ICD-10 Coding Code Description [phone] Non-pressure chronic ulcer of other part of right foot with fat layer exposed E11.621 Type 2 diabetes mellitus with foot ulcer M86.671 Other chronic  osteomyelitis, right ankle and foot Follow-up Appointments ppointment in 1 week. - Dr. Leanord Hawking 01/06/23 1pm Monday (already schedule) cast Return A ppointment in 2 weeks. - Dr. Leanord Hawking (Front office to schedule) total contact cast Return A Anesthetic (In clinic) Topical Lidocaine 5% applied to wound bed Bathing/ Shower/ Hygiene May shower with protection but do not get wound dressing(s) wet. Protect dressing(s) with water repellant cover (for example, large plastic bag) or a cast cover and may then take shower. Other Bathing/Shower/Hygiene Orders/Instructions: - When changing the dressing (every other day) you may get right foot wet. On days NOT changing the dressing.Keep wound/dressing dry Off-Loading Total Contact Cast to Right Lower Extremity - size 4 Wound Treatment Wound #4 - Amputation Site - Transmetatarsal Wound Laterality: Right Cleanser: Soap and Water 1 x Per Week/30 Days Discharge Instructions: May shower and wash wound with dial antibacterial soap and water prior to dressing change. On days not changing the dressing, please keep wound/dressing dry Topical: Gentamicin 1 x Per Week/30 Days Discharge Instructions: As directed by physician Topical: Skintegrity Hydrogel 4 (oz) 1 x Per Week/30 Days Discharge Instructions: Apply hydrogel as directed Prim Dressing: Promogran Prisma Matrix, 4.34 (sq in) (silver collagen) 1 x Per Week/30 Days ary Discharge Instructions: Moisten collagen with saline or hydrogel Secondary Dressing: ABD Pad, 8x10 1 x Per Week/30 Days Discharge Instructions: Apply over primary dressing as directed. Secondary Dressing: Woven Gauze Sponge, Non-Sterile 4x4 in 1 x Per Week/30 Days Discharge Instructions: Apply over primary dressing as directed. Secondary Dressing: Zetuvit Plus 4x8 in 1 x Per Week/30 Days Discharge Instructions: Apply over primary dressing as directed. Secured With: [employer], 4.5x3.1 (in/yd) 1 x Per Week/30 Days Discharge  Instructions: Secure with Kerlix as directed. Secured With: 3M Medipore H Soft Cloth Surgical T ape, 4 x 10 (in/yd) 1 x Per Week/30 Days Discharge Instructions: Secure with tape as directed. Secured With: total contact cast size 4 1 x Per Week/30 Days Discharge Instructions: last layer applied by provider. Electronic Signature(s) Signed: 12/31/2022 4:32:13 PM By: Baltazar Najjar MD Signed: 01/01/2023 5:35:56 PM By: Shawn Stall RN, BSN Entered By: Shawn Stall on 12/31/2022 15:14:57 [name], [name] ([id number]) [id number] Page 5 of 10 -------------------------------------------------------------------------------- Problem List Details Patient Name: Date of Service: [name] Millard IS, [name] [name]. 12/31/2022 2:30 PM Medical Record Number: [id number] Patient Account Number: [id number] Date of Birth/Sex: Treating RN: [birth year] (42 y.o. [name] Primary Care Provider: Gwinda Passe Other Clinician: Referring Provider: Treating Provider/Extender: Aurelio Brash in Treatment: 20 Active Problems ICD-10 Encounter Code Description Active Date MDM Diagnosis L97.512 Non-pressure chronic ulcer of other part of right foot with  fat layer exposed 08/08/2022 No Yes E11.621 Type 2 diabetes mellitus with foot ulcer 08/08/2022 No Yes M86.671 Other chronic osteomyelitis, right ankle and foot 08/08/2022 No Yes Inactive Problems Resolved Problems Electronic Signature(s) Signed: 12/31/2022 4:32:13 PM By: Baltazar Najjar MD Entered By: Baltazar Najjar on 12/31/2022 16:01:52 -------------------------------------------------------------------------------- Progress Note Details Patient Name: Date of Service: [name], [name] [name]. 12/31/2022 2:30 PM Medical Record Number: [id number] Patient Account Number: [id number] Date of Birth/Sex: Treating RN: Nov 02, 1980 (42 y.o. F) Primary Care Provider: Gwinda Passe Other Clinician: Referring  Provider: Treating Provider/Extender: Aurelio Brash in Treatment: 20 Subjective History of Present Illness (HPI) Admission 01/22/2021 Ms. Tianca Zammit [name] a 42 year old female with a past medical history of insulin-dependent uncontrolled type 2 diabetes with last hemoglobin A1c of 13.5, osteomyelitis of the right foot status post transmetatarsal amputation on 12/18/2020 that presents to the clinic for right foot wound. She has had dehiscence of the surgical site. [name] using wet-to-dry dressings. She has a PICC line and receiving IV ceftriaxone daily for her osteomyelitis. There [name] an end date of 01/27/2021. [name] also taking oral metronidazole. She currently denies systemic signs of infection. 1/19; patient presents for follow-up. She was diagnosed with a DVT to the right lower extremity 2 days ago. [name] on Eliquis now. [name] scheduled to see her infectious disease doctor tomorrow. She has been using Dakin's wet-to-dry dressings. She denies systemic signs of infection. 1/26; patient presents for follow-up. She saw infectious disease on 1/21 started on Augmentin. Her PICC line and IV ceftriaxone was discontinued. Patient reports stability to her wound. She has been using Dakin's wet-to-dry dressings. She currently denies systemic signs of infection. 2/3; patient presents for follow-up. She continues to use Dakin's wet-to-dry dressings to the wound bed. She saw Dr. Manson Passey with infectious disease yesterday and [name] continuing Augmentin. T entative end date [name] 2/16. Patient reports following up with orthopedics. She states there [name] no further plan from them. She currently denies systemic signs of infection. [name], [name] ([id number]) [id number] Page 6 of 10 2/10; patient presents for follow-up. She continues to use Dakin's wet to dry dressings. [name] scheduled to have her MRI done on 2/14. She states that she had pain to the  debridement site from last clinic visit and declines debridement today. She denies systemic signs of infection. She continues to have yellow thick drainage. 2/20; patient presents for follow-up. She continues to use Dakin's wet-to-dry dressings. She obtained her MRI. The results showed an abscess and [name] scheduled to see her orthopedic surgeon on 2/23. She saw infectious disease 2/17 and her antibiotics were extended. She currently denies systemic signs of infection. 3/6; patient presents for follow-up. She had debridement and irrigation of her foot on 03/10/2021 due to abscess noted on MRI. She was started on IV ceftriaxone and oral Flagyl. She has no issues or complaints today. She has been using iodoform packing to the tunnel and Dakin's wet-to-dry to the opening. 03/26/2021: She continues on IV ceftriaxone and oral metronidazole. She has follow-up with infectious disease tomorrow. No significant issues or complaints today. Her mother continues to help her with her wound dressing, using iodoform packing strips into the tunnel and Dakin's to the open portion of the wound. 3/20; patient presents for follow-up. She continues to be on IV ceftriaxone in oral metronidazole. She has been using iodoform to the tunnel and Dakin's wet-to- dry to the open wound. She denies signs of infection. 3/27; patient  presents for follow-up. She no longer has a PICC line. She has been using iodoform to the tunnel and Dakin's wet-to-dry to the open wound. She reports improvement in wound healing. She denies signs of infection. 4/3; patient presents for follow-up. She states she has been using Hydrofera Blue to the open wound and iodoform packing to the tunnel without any issues. She denies signs of infection. 4/18; patient presents for follow-up. She saw infectious disease on 4/11. She has finished her oral antibiotics and completed a total of 6 weeks of antibiotics (this includes IV as well). No further antibiotics  needed. She has been using Hydrofera Blue and iodoform packing. She states that the tunneled area has come in and the iodoform [name] not staying in place anymore. She has no issues or complaints today. She denies signs of infection. 4/24; patient presents for follow-up. She saw Dr. Carlene Coria, plastic surgery to discuss potential skin graft/substitute placement. At this time he thinks that the skin graft would likely not take. He [name] in agreement with trying a wound VAC. Patient has been using Hydrofera Blue dressing changes with no issues. She denies signs of infection. She reports improvement in wound healing. 5/1; patient presents for follow-up. Unfortunately patient did not have insurance when we ran for the pico. There [name] an assistance program and we are trying to get this accommodated for the patient. In the meantime she has been using Hydrofera Blue without any issues. She denies signs of infection. 5/8; patient presents for follow-up. We have not heard back if pico [name] covered by her insurance. She has been using collagen to the wound bed over the past week. She denies signs of infection. 5/18; patient presents for follow-up. She has been using collagen to the wound bed without issues. Again we have not heard if pico [name] covered by her insurance. She has no issues or complaints today. 5/23; patient presents for follow-up. She has been using collagen to the wound bed. She has no issues or complaints today. She obtained the wound VAC from [facility] and brought this in today. She denies signs of infection. 6/1; patient presents for follow-up. She has been using the wound VAC for the past week. She has had this changed twice since she was last here. She reports more maceration to the periwound. She denies signs of infection. 6/7; right TMA site. There are 2 wounds 0 separated by a bridge of healed tissue. The more lateral area has undermining. Both areas have healthy looking granulation at the base but relative  the size of the wound [name] fairly deep. There [name] no exposed bone no evidence of infection. Her wound VAC was put on hold last week because of surrounding skin maceration she has been using collagen this week. She has a modified shoe 6/12; patient presents for follow-up. Last week the wound VAC was reinitiated. She had no issues with the wound VAC itself. Today she has maceration again noted to the surrounding skin. She denies signs of infection. 6/27; patient presents for follow-up. She has been using Medihoney to the wound bed. We took a break from the wound VAC because the periwound was macerated. She still has some areas of maceration to the distal foot where there [name] a callus. She currently denies signs of infection. 7/11; patient presents for follow-up. She has been using Medihoney to the wound bed. She has developed some increased warmth and erythema to the lateral aspect of the right foot. She states this [name] occurred over the past  week and there [name] increased pain. No drainage noted. 7/17; patient presents for follow-up. She has been using Medihoney to the wound bed. She completed her course of Bactrim. She reports improvement in symptoms. 7/24; Patient presents for follow up. She has been using Medihoney to the wound bed without issues. She completed another course of Bactrim. She reports improvement in her symptoms but still has some mild tenderness to the medial aspect of the foot. 7/31; Patient presents for follow-up. She has been using Medihoney and Dakin's to the wound bed. She denies signs of infection. 8/14; patient presents for follow-up. She has been using Dakin's wet-to-dry packing strips to the right medial aspect of the amputation site and Medihoney to the anterior site. She has no issues or complaints today. She has started physical therapy. She denies signs of infection. 8/29; patient presents for follow-up. She has been using Dakin's wet-to-dry packing strips to the right medial  aspect of the amputation site however this [name] becoming more difficult to place. She did report that she had increased redness and swelling to that site and developed some drainage. It has resolved. She continues with physical therapy. 9/8; patient presents for follow-up. We have been using silver alginate to the tunneled area. She completed her course of antibiotics. She reports no pain, increased swelling or erythema. 9/21; patient presents for follow-up. She has been using gentamicin to the tunneled area. She has no issues or complaints today. She denies signs of infection. 10/2; patient presents for follow-up. [name] scheduled to have her CT scan on 10/9. She currently denies signs of infection. She denies increased warmth, erythema or purulent drainage from the wound bed. She has been using Dakin's wet-to-dry dressings. 10/12; patient presents for follow-up. She had her CT scan on 10/9 that showed A small irregular rim-enhancing fluid pocket communicating to the overlying soft tissues of the sinus tract compatible with a small abscess. Currently she denies systemic signs of infection. She has been doing Dakin's wet-to-dry packing strips but it [name] hard for her to pack into the narrow opening. 10/30; patient presents for follow-up. Since last clinic visit she has had OR debridement of her Right foot by Dr. Odis Hollingshead Due to abscess noted on CT. She has been using iodoform packing. 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 She will complete 2 weeks of this and continue with oral amoxicillin for the next 6 to 12 months. She follows with Dr. Luciana Axe for this. She currently denies signs of infection. [name], [name] ([id number]) [id number] Page 7 of 10 11/13; patient presents for follow-up. Patient has been using silver alginate with gentamicin to the wound bed. She has no issues or complaints today she. She reports improvement in wound  healing. 12/4; patient presents for follow-up. She has been using silver alginate and gentamicin to the wound bed. She has no issues or complaints today. 12/8; patient presents for follow-up. She has been using silver alginate with gentamicin to the wound bed. She presents for her first cast placement. She will be back early next week for her obligatory cast change. 12/12; patient presents for follow-up. We have been using collagen with antibiotic ointment to the wound bed under a total contact cast. She has tolerated this well. [name] improved in wound healing. 12/18; patient presents for follow-up. We have been using collagen with antibiotic ointment under the total contact cast. She has no complaints today. 1/2; The cast was placed at last clinic visit however  patient missed her follow-up and this was taken off last week at a nurse visit. Patient has been using packing strips to the medial narrowed wound. She has noted no drainage. 1/22; patient presents for follow-up. She has been keeping the area covered. She reports no drainage. She has had no issues to the previous wound site. She denies any signs of infection including increased warmth, erythema or purulent drainage. 08/08/2022 Ms. Makinley Mccalman [name] a 42 year old female with a past medical history of insulin-dependent uncontrolled type 2 diabetes with last hemoglobin A1c of 8.8, chronic osteomyelitis of the right foot status post transmetatarsal amputation on 12/18/2020 that has been previously treated for surgical dehiscence of this site in our clinic. She presents today 6 months after discharge from our clinic due to healed right foot wound with now reopening of the previous surgical site. She reports chronic pain to the site however denies purulent drainage, increased warmth or erythema. She noticed the wound opening about 1 month ago. She has been doing physical therapy without significant issues. She has inserts to her shoe however she has  been advised to follow-up with [employer] for new custom inserts by her orthopedic surgeon now that she has a new wound. She states she will try and do this. She has been using antibiotic ointment to the wound bed. 8/1; patient presents for follow-up. She has been taking her antibiotics without issues. She has been using Dakin's wet-to-dry dressings to the wound bed. The wound [name] smaller. She denies systemic signs of infection. 8/13; patient presents for follow-up. She had ABIs completed that were normal. On the right it was 1.02 and on the left was 0.99. She has been using silver alginate to the wound bed with gentamicin ointment. She states it [name] hard to pack the wound bed. She currently denies signs of infection. 8/29; patient presents for follow-up. She has been using collagen with antibiotic ointment to the wound bed. Wound [name] stable. She denies signs of infection. 9/5; patient presents for follow-up. She has been using silver alginate to the wound bed. Wound [name] slightly wider. She denies signs of infection. 9/12; patient presents for follow-up. She has been using silver alginate to the wound bed. Wound [name] slightly smaller today. 9/19; right foot in the setting of a TMA over the first metatarsal head. She has thick callus around this area small fissured wound which actually looks like it [name] attempting to close. She has a offloading shoe I am not sure how much activity [name] in. I did not have a lot of time to see her today because of transportation 9/26; right foot in the setting of a previous TMA. The wound [name] over the plantar first metatarsal head. This [name] a fissured wound with thick callus around the margins. It [name] small in terms of overall surface area but has a depth of about 0.6 cm there was no palpable bone. She has a history of chronic osteomyelitis although she has been treated for this I have reviewed CT scans from 2023 also an MRI from 10/26/2021. Neither imaging study showed  osteomyelitis in this area although the CT scan suggested osteomyelitis in the fourth metatarsal head. There has been problems with abscesses communicating with the wound although there does not appear to be purulent drainage currently 10/3; right foot at the TMA site. Small probing wound. I changed her to Iodosorb with gauze last time. May be some improvement. She has a depth of about 0.6 cm but there [name] no palpable  bone. She has been previously treated for chronic osteomyelitis in this area. 10/17; this [name] a patient with a probing wound at the right TMA site. Underlying osteomyelitis previously treated earlier this year. I been using Iodosorb to try and help constrict this wound however today she comes in with a larger circumference a larger tunnel and this probably sits right on top of bone. Also worrisome [name] there was a streaking dark area just caudal to the wound on the dorsal surface of the foot. The patient [name] complaining of more pain but no fever. 10/24; culture of this area [bone scraping] revealed Acinetobacter and 2 coag negative staph. I am going to give her Septra DS 1 p.o. twice daily for 2 weeks. I think it would be simplistic to call this a bone culture but it certainly was a deep culture. An x-ray was done but that has not been reported yet. I had wanted to get lab work including a CBC with differential ESR and C-reactive protein but I am not actually sure that got done last week. We have been using gent and silver alginate. 11/7; patient [name] still taking trimethoprim/sulfamethoxazole for the assessment of bacterial identified last week. Lab work revealed a C-reactive protein of 4.1 which [name] elevated from 1.7 but not dramatic. White count was normal at 8.4 x-ray of the right foot revealed minimal new lucency and cortical erosion within the adjacent distal plantar aspect of the metatarsals concerning for acute osteomyelitis early. 11/19; the patient has completed her Septra DS which I  gave to her for 2 weeks. She has her MRI tomorrow but then we will have the obligatory 5 to 10- day/workday wait to get a report. I am going to resume her Septra DS for a further 2 weeks. Then decide on additional antibiotics, infectious disease etc. 12/5; the patient has completed the Septra DS for the originally cultured as Enterobacter on a bone scraping. Her MRI somewhat surprisingly showed status post amputation of the 1st through 5th metatarsal base but no evidence of osteomyelitis. Surrounding skin thickening with mild edema could represent cellulitis. Previous x-ray showed the suggestion of bone erosion although this was not verified by her MRI. 12/9; patient comes in today for follow-up. She tolerated the total contact cast well. The areas on the right TMA laterally. Medially she has undermining. What I can see of the granulation looks healthy. We are using Prisma and we placed the total contact cast again last week 12/17;. She has a area on the right TMA medially. This seems to have contracted quite a bit this week. There [name] still 1 cm of depth. No palpable bone. No purulent drainage. Again MRI did not suggest underlying osteomyelitis. She has completed the antibiotics I gave her for S Enterobacter. We put her back in a total contact cast last week using Prisma as the primary dressing Objective [name], [name] ([id number]) [phone].pdf Page 8 of 10 Constitutional Sitting or standing Blood Pressure [name] within target range for patient.. Pulse regular and within target range for patient.Marland Kitchen Respirations regular, non-labored and within target range.. Temperature [name] normal and within the target range for the patient.Marland Kitchen Appears in no distress. Vitals Time Taken: 2:59 PM, Height: 69 in, Weight: 330 lbs, BMI: 48.7, Temperature: 98.6 F, Pulse: 87 bpm, Respiratory Rate: 20 breaths/min, Blood Pressure: 114/78 mmHg. General Notes: Wound exam; medial first metatarsal head on the  right TMA amputation site. This undermines laterally about 1 cm in depth. This seems to have come in somewhat at  least in terms of the circumference of the wound versus last week there [name] no palpable bone no purulence. No debridement was done. We replaced a total contact cast in the standard fashion Integumentary (Hair, Skin) Wound #4 status [name] Open. Original cause of wound was Shear/Friction. The date acquired was: 07/08/2022. The wound has been in treatment 20 weeks. The wound [name] located on the Right Amputation Site - Transmetatarsal. The wound measures 1cm length x 0.5cm width x 1cm depth; 0.393cm^2 area and 0.393cm^3 volume. There [name] Fat Layer (Subcutaneous Tissue) exposed. There [name] no tunneling noted, however, there [name] undermining starting at 12:00 and ending at 6:00 with a maximum distance of 0.7cm. There [name] a large amount of serosanguineous drainage noted. The wound margin [name] thickened. There [name] large (67-100%) red, pink granulation within the wound bed. There [name] a small (1-33%) amount of necrotic tissue within the wound bed including Adherent Slough. The periwound skin appearance had no abnormalities noted for moisture. The periwound skin appearance had no abnormalities noted for color. The periwound skin appearance exhibited: Callus. The periwound skin appearance did not exhibit: Crepitus, Excoriation, Induration, Rash, Scarring. Periwound temperature was noted as No Abnormality. The periwound has tenderness on palpation. Assessment Active Problems ICD-10 Non-pressure chronic ulcer of other part of right foot with fat layer exposed Type 2 diabetes mellitus with foot ulcer Other chronic osteomyelitis, right ankle and foot Procedures Wound #4 Pre-procedure diagnosis of Wound #4 [name] a Diabetic Wound/Ulcer of the Lower Extremity located on the Right Amputation Site - Transmetatarsal . There was a T Contact Cast Procedure by [name] Caul., MD. otal Post procedure Diagnosis Wound #4:  Same as Pre-Procedure Notes: size 4 TCC. Plan Follow-up Appointments: Return Appointment in 1 week. - Dr. Leanord Hawking 01/06/23 1pm Monday (already schedule) cast Return Appointment in 2 weeks. - Dr. Leanord Hawking (Front office to schedule) total contact cast Anesthetic: (In clinic) Topical Lidocaine 5% applied to wound bed Bathing/ Shower/ Hygiene: May shower with protection but do not get wound dressing(s) wet. Protect dressing(s) with water repellant cover (for example, large plastic bag) or a cast cover and may then take shower. Other Bathing/Shower/Hygiene Orders/Instructions: - When changing the dressing (every other day) you may get right foot wet. On days NOT changing the dressing.Keep wound/dressing dry Off-Loading: T Contact Cast to Right Lower Extremity - size 4 otal WOUND #4: - Amputation Site - Transmetatarsal Wound Laterality: Right Cleanser: Soap and Water 1 x Per Week/30 Days Discharge Instructions: May shower and wash wound with dial antibacterial soap and water prior to dressing change. On days not changing the dressing, please keep wound/dressing dry Topical: Gentamicin 1 x Per Week/30 Days Discharge Instructions: As directed by physician Topical: Skintegrity Hydrogel 4 (oz) 1 x Per Week/30 Days Discharge Instructions: Apply hydrogel as directed Prim Dressing: Promogran Prisma Matrix, 4.34 (sq in) (silver collagen) 1 x Per Week/30 Days ary Discharge Instructions: Moisten collagen with saline or hydrogel Secondary Dressing: ABD Pad, 8x10 1 x Per Week/30 Days Discharge Instructions: Apply over primary dressing as directed. Secondary Dressing: Woven Gauze Sponge, Non-Sterile 4x4 in 1 x Per Week/30 Days Discharge Instructions: Apply over primary dressing as directed. Secondary Dressing: Zetuvit Plus 4x8 in 1 x Per Week/30 Days Discharge Instructions: Apply over primary dressing as directed. Secured With: [employer], 4.5x3.1 (in/yd) 1 x Per Week/30 Days Discharge  Instructions: Secure with Kerlix as directed. Secured With: 1M Medipore H Soft Cloth Surgical T ape, 4 x 10 (in/yd) 1 x Per Week/30 Days Discharge  Instructions: Secure with tape as directed. Secured With: total contact cast size 4 1 x Per Week/30 Days [name], [name] ([id number]) [phone].pdf Page 9 of 10 Discharge Instructions: last layer applied by provider. 1. We use Prisma and padded the area well to except drainage and replace the total contact cast. 2. The wound orifice seems to have come down quite a bit. He has 1 cm in the lateral undermining however Electronic Signature(s) Signed: 12/31/2022 4:32:13 PM By: Baltazar Najjar MD Entered By: Baltazar Najjar on 12/31/2022 16:07:54 -------------------------------------------------------------------------------- Total Contact Cast Details Patient Name: Date of Service: [name] Millard IS, [name] [name]. 12/31/2022 2:30 PM Medical Record Number: [id number] Patient Account Number: [id number] Date of Birth/Sex: Treating RN: 27-Jul-1980 (42 y.o. [name] Primary Care Provider: Gwinda Passe Other Clinician: Referring Provider: Treating Provider/Extender: Aurelio Brash in Treatment: 20 T Contact Cast Applied for Wound Assessment: otal Wound #4 Right Amputation Site - Transmetatarsal Performed By: Physician [name] Caul., MD The following information was scribed by: Shawn Stall The information was scribed for: Gregery Na Procedure Diagnosis Same as Pre-procedure Notes size 4 TCC Electronic Signature(s) Signed: 12/31/2022 4:32:13 PM By: Baltazar Najjar MD Signed: 01/01/2023 5:35:56 PM By: Shawn Stall RN, BSN Entered By: Shawn Stall on 12/31/2022 15:11:52 -------------------------------------------------------------------------------- SuperBill Details Patient Name: Date of Service: [name], [name] [name]. 12/31/2022 Medical Record Number: [id number] Patient Account  Number: [id number] Date of Birth/Sex: Treating RN: 1980/04/19 (42 y.o. [name] Primary Care Provider: Gwinda Passe Other Clinician: Referring Provider: Treating Provider/Extender: Aurelio Brash in Treatment: 20 Diagnosis Coding ICD-10 Codes Code Description [phone] Non-pressure chronic ulcer of other part of right foot with fat layer exposed E11.621 Type 2 diabetes mellitus with foot ulcer M86.671 Other chronic osteomyelitis, right ankle and foot Facility Procedures : Force, [name] CPT4 Code: [name] ([id number]) [id number] I Description: [phone] - APPLY TOTAL CONTACT LEG CAST CD-10 Diagnosis Description L97.512 Non-pressure chronic ulcer of other part of right foot with fat layer exposed E11.621 Type 2 diabetes mellitus with foot ulcer Modifier: [id number] Quantity: 1227.pdf Page 10 of 10 1 Physician Procedures : CPT4 Code Description Modifier [id number] [phone] - WC PHYS APPLY TOTAL CONTACT CAST ICD-10 Diagnosis Description L97.512 Non-pressure chronic ulcer of other part of right foot with fat layer exposed E11.621 Type 2 diabetes mellitus with foot ulcer Quantity: 1 Electronic Signature(s) Signed: 12/31/2022 4:32:13 PM By: Baltazar Najjar MD Entered By: Baltazar Najjar on 12/31/2022 16:08:04

## 2023-01-06 ENCOUNTER — Encounter (HOSPITAL_BASED_OUTPATIENT_CLINIC_OR_DEPARTMENT_OTHER): Payer: Medicaid Other | Admitting: Internal Medicine

## 2023-01-06 DIAGNOSIS — E11621 Type 2 diabetes mellitus with foot ulcer: Secondary | ICD-10-CM | POA: Diagnosis not present

## 2023-01-07 NOTE — Progress Notes (Signed)
ARMAHNI, VINCENTI ([id number]) [id number] Page 1 of 11 Visit Report for 01/06/2023 Debridement Details Patient Name: Date of Service: [name] Seth Bake IS, [name]. 01/06/2023 1:00 PM Medical Record Number: [id number] Patient Account Number: [id number] Date of Birth/Sex: Treating RN: May 15, 1980 (42 y.o. [name] Primary Care Provider: Gwinda Passe Other Clinician: Referring Provider: Treating Provider/Extender: Aurelio Brash in Treatment: 21 Debridement Performed for Assessment: Wound #4 Right Amputation Site - Transmetatarsal Performed By: Physician Maxwell Caul., MD The following information was scribed by: Shawn Stall The information was scribed for: Baltazar Najjar Debridement Type: Debridement Severity of Tissue Pre Debridement: Fat layer exposed Level of Consciousness (Pre-procedure): Awake and Alert Pre-procedure Verification/Time Out Yes - 14:00 Taken: Start Time: 14:01 Pain Control: Lidocaine 4% T opical Solution Percent of Wound Bed Debrided: 100% T Area Debrided (cm): otal 0.35 Tissue and other material debrided: Viable, Non-Viable, Callus, Slough, Subcutaneous, Skin: Dermis , Skin: Epidermis, Slough Level: Skin/Subcutaneous Tissue Debridement Description: Excisional Instrument: Curette Bleeding: Minimum Hemostasis Achieved: Pressure End Time: 14:09 Procedural Pain: 0 Post Procedural Pain: 0 Response to Treatment: Procedure was tolerated well Level of Consciousness (Post- Awake and Alert procedure): Post Debridement Measurements of Total Wound Length: (cm) 1.1 Width: (cm) 0.4 Depth: (cm) 1 Volume: (cm) 0.346 Character of Wound/Ulcer Post Debridement: Improved Severity of Tissue Post Debridement: Fat layer exposed Post Procedure Diagnosis Same as Pre-procedure Electronic Signature(s) Signed: 01/06/2023 4:50:55 PM By: Shawn Stall RN, BSN Signed: 01/07/2023 11:51:18 AM By: Baltazar Najjar  MD Entered By: Shawn Stall on 01/06/2023 11:09:49 -------------------------------------------------------------------------------- HPI Details Patient Name: Date of Service: [name] V IS, [name]. 01/06/2023 1:00 PM Medical Record Number: [id number] Patient Account Number: [id number] Date of Birth/Sex: Treating RN: [birth year] (42 y.o. F) Primary Care Provider: Gwinda Passe Other Clinician: Referring Provider: Treating Provider/Extender: Bentlei, Wiatr, Sharol Harness ([id number]) [phone].pdf Page 2 of 11 Weeks in Treatment: 21 History of Present Illness HPI Description: Admission 01/22/2021 [name] [name] a 42 year old female with a past medical history of insulin-dependent uncontrolled type 2 diabetes with last hemoglobin A1c of 13.5, osteomyelitis of the right foot status post transmetatarsal amputation on 12/18/2020 that presents to the clinic for right foot wound. She has had dehiscence of the surgical site. [name] using wet-to-dry dressings. She has a PICC line and receiving IV ceftriaxone daily for her osteomyelitis. There [name] an end date of 01/27/2021. [name] also taking oral metronidazole. She currently denies systemic signs of infection. 1/19; patient presents for follow-up. She was diagnosed with a DVT to the right lower extremity 2 days ago. [name] on Eliquis now. [name] scheduled to see her infectious disease doctor tomorrow. She has been using Dakin's wet-to-dry dressings. She denies systemic signs of infection. 1/26; patient presents for follow-up. She saw infectious disease on 1/21 started on Augmentin. Her PICC line and IV ceftriaxone was discontinued. Patient reports stability to her wound. She has been using Dakin's wet-to-dry dressings. She currently denies systemic signs of infection. 2/3; patient presents for follow-up. She continues to use Dakin's wet-to-dry dressings to the wound bed. She saw Dr. Manson Passey with  infectious disease yesterday and [name] continuing Augmentin. T entative end date [name] 2/16. Patient reports following up with orthopedics. She states there [name] no further plan from them. She currently denies systemic signs of infection. 2/10; patient presents for follow-up. She continues to use Dakin's wet to dry dressings. [name] scheduled to have her MRI done on 2/14. She states that  she had pain to the debridement site from last clinic visit and declines debridement today. She denies systemic signs of infection. She continues to have yellow thick drainage. 2/20; patient presents for follow-up. She continues to use Dakin's wet-to-dry dressings. She obtained her MRI. The results showed an abscess and [name] scheduled to see her orthopedic surgeon on 2/23. She saw infectious disease 2/17 and her antibiotics were extended. She currently denies systemic signs of infection. 3/6; patient presents for follow-up. She had debridement and irrigation of her foot on 03/10/2021 due to abscess noted on MRI. She was started on IV ceftriaxone and oral Flagyl. She has no issues or complaints today. She has been using iodoform packing to the tunnel and Dakin's wet-to-dry to the opening. 03/26/2021: She continues on IV ceftriaxone and oral metronidazole. She has follow-up with infectious disease tomorrow. No significant issues or complaints today. Her mother continues to help her with her wound dressing, using iodoform packing strips into the tunnel and Dakin's to the open portion of the wound. 3/20; patient presents for follow-up. She continues to be on IV ceftriaxone in oral metronidazole. She has been using iodoform to the tunnel and Dakin's wet-to- dry to the open wound. She denies signs of infection. 3/27; patient presents for follow-up. She no longer has a PICC line. She has been using iodoform to the tunnel and Dakin's wet-to-dry to the open wound. She reports improvement in wound healing. She denies signs of  infection. 4/3; patient presents for follow-up. She states she has been using Hydrofera Blue to the open wound and iodoform packing to the tunnel without any issues. She denies signs of infection. 4/18; patient presents for follow-up. She saw infectious disease on 4/11. She has finished her oral antibiotics and completed a total of 6 weeks of antibiotics (this includes IV as well). No further antibiotics needed. She has been using Hydrofera Blue and iodoform packing. She states that the tunneled area has come in and the iodoform [name] not staying in place anymore. She has no issues or complaints today. She denies signs of infection. 4/24; patient presents for follow-up. She saw Dr. Carlene Coria, plastic surgery to discuss potential skin graft/substitute placement. At this time he thinks that the skin graft would likely not take. He [name] in agreement with trying a wound VAC. Patient has been using Hydrofera Blue dressing changes with no issues. She denies signs of infection. She reports improvement in wound healing. 5/1; patient presents for follow-up. Unfortunately patient did not have insurance when we ran for the pico. There [name] an assistance program and we are trying to get this accommodated for the patient. In the meantime she has been using Hydrofera Blue without any issues. She denies signs of infection. 5/8; patient presents for follow-up. We have not heard back if pico [name] covered by her insurance. She has been using collagen to the wound bed over the past week. She denies signs of infection. 5/18; patient presents for follow-up. She has been using collagen to the wound bed without issues. Again we have not heard if pico [name] covered by her insurance. She has no issues or complaints today. 5/23; patient presents for follow-up. She has been using collagen to the wound bed. She has no issues or complaints today. She obtained the wound VAC from [facility] and brought this in today. She denies signs of  infection. 6/1; patient presents for follow-up. She has been using the wound VAC for the past week. She has had this changed twice since  she was last here. She reports more maceration to the periwound. She denies signs of infection. 6/7; right TMA site. There are 2 wounds 0 separated by a bridge of healed tissue. The more lateral area has undermining. Both areas have healthy looking granulation at the base but relative the size of the wound [name] fairly deep. There [name] no exposed bone no evidence of infection. Her wound VAC was put on hold last week because of surrounding skin maceration she has been using collagen this week. She has a modified shoe 6/12; patient presents for follow-up. Last week the wound VAC was reinitiated. She had no issues with the wound VAC itself. Today she has maceration again noted to the surrounding skin. She denies signs of infection. 6/27; patient presents for follow-up. She has been using Medihoney to the wound bed. We took a break from the wound VAC because the periwound was macerated. She still has some areas of maceration to the distal foot where there [name] a callus. She currently denies signs of infection. 7/11; patient presents for follow-up. She has been using Medihoney to the wound bed. She has developed some increased warmth and erythema to the lateral aspect of the right foot. She states this [name] occurred over the past week and there [name] increased pain. No drainage noted. 7/17; patient presents for follow-up. She has been using Medihoney to the wound bed. She completed her course of Bactrim. She reports improvement in symptoms. 7/24; Patient presents for follow up. She has been using Medihoney to the wound bed without issues. She completed another course of Bactrim. She reports improvement in her symptoms but still has some mild tenderness to the medial aspect of the foot. 7/31; Patient presents for follow-up. She has been using Medihoney and Dakin's to the wound bed.  She denies signs of infection. 8/14; patient presents for follow-up. She has been using Dakin's wet-to-dry packing strips to the right medial aspect of the amputation site and Medihoney to EASTYNN, MATOS [name] ([id number]) [phone].pdf Page 3 of 11 the anterior site. She has no issues or complaints today. She has started physical therapy. She denies signs of infection. 8/29; patient presents for follow-up. She has been using Dakin's wet-to-dry packing strips to the right medial aspect of the amputation site however this [name] becoming more difficult to place. She did report that she had increased redness and swelling to that site and developed some drainage. It has resolved. She continues with physical therapy. 9/8; patient presents for follow-up. We have been using silver alginate to the tunneled area. She completed her course of antibiotics. She reports no pain, increased swelling or erythema. 9/21; patient presents for follow-up. She has been using gentamicin to the tunneled area. She has no issues or complaints today. She denies signs of infection. 10/2; patient presents for follow-up. [name] scheduled to have her CT scan on 10/9. She currently denies signs of infection. She denies increased warmth, erythema or purulent drainage from the wound bed. She has been using Dakin's wet-to-dry dressings. 10/12; patient presents for follow-up. She had her CT scan on 10/9 that showed A small irregular rim-enhancing fluid pocket communicating to the overlying soft tissues of the sinus tract compatible with a small abscess. Currently she denies systemic signs of infection. She has been doing Dakin's wet-to-dry packing strips but it [name] hard for her to pack into the narrow opening. 10/30; patient presents for follow-up. Since last clinic visit she has had OR debridement of her Right foot by Dr. Odis Hollingshead  Due to abscess noted on CT. She has been using iodoform packing. 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 She will complete 2 weeks of this and continue with oral amoxicillin for the next 6 to 12 months. She follows with Dr. Luciana Axe for this. She currently denies signs of infection. 11/13; patient presents for follow-up. Patient has been using silver alginate with gentamicin to the wound bed. She has no issues or complaints today she. She reports improvement in wound healing. 12/4; patient presents for follow-up. She has been using silver alginate and gentamicin to the wound bed. She has no issues or complaints today. 12/8; patient presents for follow-up. She has been using silver alginate with gentamicin to the wound bed. She presents for her first cast placement. She will be back early next week for her obligatory cast change. 12/12; patient presents for follow-up. We have been using collagen with antibiotic ointment to the wound bed under a total contact cast. She has tolerated this well. [name] improved in wound healing. 12/18; patient presents for follow-up. We have been using collagen with antibiotic ointment under the total contact cast. She has no complaints today. 1/2; The cast was placed at last clinic visit however patient missed her follow-up and this was taken off last week at a nurse visit. Patient has been using packing strips to the medial narrowed wound. She has noted no drainage. 1/22; patient presents for follow-up. She has been keeping the area covered. She reports no drainage. She has had no issues to the previous wound site. She denies any signs of infection including increased warmth, erythema or purulent drainage. 08/08/2022 [name] a 42 year old female with a past medical history of insulin-dependent uncontrolled type 2 diabetes with last hemoglobin A1c of 8.8, chronic osteomyelitis of the right foot status post transmetatarsal amputation on 12/18/2020 that has been previously treated for surgical dehiscence of  this site in our clinic. She presents today 6 months after discharge from our clinic due to healed right foot wound with now reopening of the previous surgical site. She reports chronic pain to the site however denies purulent drainage, increased warmth or erythema. She noticed the wound opening about 1 month ago. She has been doing physical therapy without significant issues. She has inserts to her shoe however she has been advised to follow-up with [employer] for new custom inserts by her orthopedic surgeon now that she has a new wound. She states she will try and do this. She has been using antibiotic ointment to the wound bed. 8/1; patient presents for follow-up. She has been taking her antibiotics without issues. She has been using Dakin's wet-to-dry dressings to the wound bed. The wound [name] smaller. She denies systemic signs of infection. 8/13; patient presents for follow-up. She had ABIs completed that were normal. On the right it was 1.02 and on the left was 0.99. She has been using silver alginate to the wound bed with gentamicin ointment. She states it [name] hard to pack the wound bed. She currently denies signs of infection. 8/29; patient presents for follow-up. She has been using collagen with antibiotic ointment to the wound bed. Wound [name] stable. She denies signs of infection. 9/5; patient presents for follow-up. She has been using silver alginate to the wound bed. Wound [name] slightly wider. She denies signs of infection. 9/12; patient presents for follow-up. She has been using silver alginate to the wound bed. Wound [name] slightly smaller today. 9/19; right  foot in the setting of a TMA over the first metatarsal head. She has thick callus around this area small fissured wound which actually looks like it [name] attempting to close. She has a offloading shoe I am not sure how much activity [name] in. I did not have a lot of time to see her today because of transportation 9/26; right foot in the setting of  a previous TMA. The wound [name] over the plantar first metatarsal head. This [name] a fissured wound with thick callus around the margins. It [name] small in terms of overall surface area but has a depth of about 0.6 cm there was no palpable bone. She has a history of chronic osteomyelitis although she has been treated for this I have reviewed CT scans from 2023 also an MRI from 10/26/2021. Neither imaging study showed osteomyelitis in this area although the CT scan suggested osteomyelitis in the fourth metatarsal head. There has been problems with abscesses communicating with the wound although there does not appear to be purulent drainage currently 10/3; right foot at the TMA site. Small probing wound. I changed her to Iodosorb with gauze last time. May be some improvement. She has a depth of about 0.6 cm but there [name] no palpable bone. She has been previously treated for chronic osteomyelitis in this area. 10/17; this [name] a patient with a probing wound at the right TMA site. Underlying osteomyelitis previously treated earlier this year. I been using Iodosorb to try and help constrict this wound however today she comes in with a larger circumference a larger tunnel and this probably sits right on top of bone. Also worrisome [name] there was a streaking dark area just caudal to the wound on the dorsal surface of the foot. The patient [name] complaining of more pain but no fever. 10/24; culture of this area [bone scraping] revealed Acinetobacter and 2 coag negative staph. I am going to give her Septra DS 1 p.o. twice daily for 2 weeks. I think it would be simplistic to call this a bone culture but it certainly was a deep culture. An x-ray was done but that has not been reported yet. I had wanted to get lab work including a CBC with differential ESR and C-reactive protein but I am not actually sure that got done last week. We have been using gent and silver alginate. 11/7; patient [name] still taking  trimethoprim/sulfamethoxazole for the assessment of bacterial identified last week. Lab work revealed a C-reactive protein of 4.1 which [name] elevated from 1.7 but not dramatic. White count was normal at 8.4 x-ray of the right foot revealed minimal new lucency and cortical erosion within the adjacent distal plantar aspect of the metatarsals concerning for acute osteomyelitis early. 11/19; the patient has completed her Septra DS which I gave to her for 2 weeks. She has her MRI tomorrow but then we will have the obligatory 5 to [address]Iya [name], [name] ([id number]) [phone].pdf Page 4 of 11 day/workday wait to get a report. I am going to resume her Septra DS for a further 2 weeks. Then decide on additional antibiotics, infectious disease etc. 12/5; the patient has completed the Septra DS for the originally cultured as Enterobacter on a bone scraping. Her MRI somewhat surprisingly showed status post amputation of the 1st through 5th metatarsal base but no evidence of osteomyelitis. Surrounding skin thickening with mild edema could represent cellulitis. Previous x-ray showed the suggestion of bone erosion although this was not verified by her MRI. 12/9; patient  comes in today for follow-up. She tolerated the total contact cast well. The areas on the right TMA laterally. Medially she has undermining. What I can see of the granulation looks healthy. We are using Prisma and we placed the total contact cast again last week 12/17;. She has a area on the right TMA medially. This seems to have contracted quite a bit this week. There [name] still 1 cm of depth. No palpable bone. No purulent drainage. Again MRI did not suggest underlying osteomyelitis. She has completed the antibiotics I gave her for S Enterobacter. We put her back in a total contact cast last week using Prisma as the primary dressing 12/23; right TMA medially. This [name] a weightbearing surface we now have her in a total contact cast.  We have been using Prisma and gentamicin. The gentamicin to target S adeno bacteria. [name] completed a course of Septra. MRI did not underlying osteomyelitis. Electronic Signature(s) Signed: 01/07/2023 11:51:18 AM By: Baltazar Najjar MD Entered By: Baltazar Najjar on 01/06/2023 11:17:57 -------------------------------------------------------------------------------- Physical Exam Details Patient Name: Date of Service: [name] V IS, [name]. 01/06/2023 1:00 PM Medical Record Number: [id number] Patient Account Number: [id number] Date of Birth/Sex: Treating RN: 05-06-1980 (42 y.o. F) Primary Care Provider: Gwinda Passe Other Clinician: Referring Provider: Treating Provider/Extender: Aurelio Brash in Treatment: 21 Constitutional Sitting or standing Blood Pressure [name] within target range for patient.. Pulse regular and within target range for patient.Marland Kitchen Respirations regular, non-labored and within target range.. Temperature [name] normal and within the target range for the patient.Marland Kitchen Appears in no distress. Notes Wound exam; medial first metatarsal head at right TMA site. There [name] undermining still about 1 cm. I was able to remove some circumferential thick callus skin from the inferior recess of this. This does not probe to bone. No evidence of surrounding infection Electronic Signature(s) Signed: 01/07/2023 11:51:18 AM By: Baltazar Najjar MD Entered By: Baltazar Najjar on 01/06/2023 11:20:16 -------------------------------------------------------------------------------- Physician Orders Details Patient Name: Date of Service: [name] V IS, [name]. 01/06/2023 1:00 PM Medical Record Number: [id number] Patient Account Number: [id number] Date of Birth/Sex: Treating RN: 10-Sep-1980 (42 y.o. [name] Primary Care Provider: Gwinda Passe Other Clinician: Referring Provider: Treating Provider/Extender: Aurelio Brash in Treatment: 21 The  following information was scribed by: Shawn Stall The information was scribed for: Baltazar Najjar Verbal / Phone Orders: No Diagnosis Coding ICD-10 Coding Code Description L97.512 Non-pressure chronic ulcer of other part of right foot with fat layer exposed E11.621 Type 2 diabetes mellitus with foot ulcer Gosling, [name] ([id number]) [id number] Page 5 of [phone] Other chronic osteomyelitis, right ankle and foot Follow-up Appointments ppointment in 1 week. - Dr.Cannon covering for Dr. Leanord Hawking 01/13/2023 3pm (already scheduled) Return A ppointment in 2 weeks. - ***Dr. Leonidas Romberg week of January 2025 (front office to schedule) Return A Return appointment in 3 weeks. - ***Dr. Hoffman****January 2025 (front office to schedule) Return appointment in 1 month. - ***Dr. Hoffman****January 2025 (front office to schedule) Anesthetic (In clinic) Topical Lidocaine 5% applied to wound bed Bathing/ Shower/ Hygiene May shower with protection but do not get wound dressing(s) wet. Protect dressing(s) with water repellant cover (for example, large plastic bag) or a cast cover and may then take shower. Other Bathing/Shower/Hygiene Orders/Instructions: - When changing the dressing (every other day) you may get right foot wet. On days NOT changing the dressing.Keep wound/dressing dry Off-Loading Total Contact Cast to Right Lower Extremity - size 4 Wound Treatment Wound #  4 - Amputation Site - Transmetatarsal Wound Laterality: Right Cleanser: Soap and Water 1 x Per Week/30 Days Discharge Instructions: May shower and wash wound with dial antibacterial soap and water prior to dressing change. On days not changing the dressing, please keep wound/dressing dry Topical: Gentamicin 1 x Per Week/30 Days Discharge Instructions: As directed by physician Prim Dressing: Promogran Prisma Matrix, 4.34 (sq in) (silver collagen) 1 x Per Week/30 Days ary Discharge Instructions: Moisten  collagen with saline or hydrogel Secondary Dressing: ABD Pad, 8x10 1 x Per Week/30 Days Discharge Instructions: Apply over primary dressing as directed. Secondary Dressing: Woven Gauze Sponge, Non-Sterile 4x4 in 1 x Per Week/30 Days Discharge Instructions: Apply over primary dressing as directed. Secondary Dressing: Zetuvit Plus 4x8 in 1 x Per Week/30 Days Discharge Instructions: Apply over primary dressing as directed. Secured With: [employer], 4.5x3.1 (in/yd) 1 x Per Week/30 Days Discharge Instructions: Secure with Kerlix as directed. Secured With: 69M Medipore H Soft Cloth Surgical T ape, 4 x 10 (in/yd) 1 x Per Week/30 Days Discharge Instructions: Secure with tape as directed. Secured With: total contact cast size 4 1 x Per Week/30 Days Discharge Instructions: last layer applied by provider. Electronic Signature(s) Signed: 01/06/2023 4:50:55 PM By: Shawn Stall RN, BSN Signed: 01/07/2023 11:51:18 AM By: Baltazar Najjar MD Entered By: Shawn Stall on 01/06/2023 11:08:43 -------------------------------------------------------------------------------- Problem List Details Patient Name: Date of Service: [name] V IS, [name]. 01/06/2023 1:00 PM Medical Record Number: [id number] Patient Account Number: [id number] Date of Birth/Sex: Treating RN: Apr 03, 1980 (42 y.o. [name] Primary Care Provider: Gwinda Passe Other Clinician: Referring Provider: Treating Provider/Extender: Aurelio Brash in Treatment: 21 Active Problems ICD-10 [name], STROZEWSKI ([id number]) [id number] Page 6 of 11 Encounter Code Description Active Date MDM Diagnosis L97.512 Non-pressure chronic ulcer of other part of right foot with fat layer exposed 08/08/2022 No Yes E11.621 Type 2 diabetes mellitus with foot ulcer 08/08/2022 No Yes M86.671 Other chronic osteomyelitis, right ankle and foot 08/08/2022 No Yes Inactive Problems Resolved  Problems Electronic Signature(s) Signed: 01/07/2023 11:51:18 AM By: Baltazar Najjar MD Entered By: Baltazar Najjar on 01/06/2023 11:12:32 -------------------------------------------------------------------------------- Progress Note Details Patient Name: Date of Service: [name] V IS, [name]. 01/06/2023 1:00 PM Medical Record Number: [id number] Patient Account Number: [id number] Date of Birth/Sex: Treating RN: Mar 27, 1980 (42 y.o. F) Primary Care Provider: Gwinda Passe Other Clinician: Referring Provider: Treating Provider/Extender: Aurelio Brash in Treatment: 21 Subjective History of Present Illness (HPI) Admission 01/22/2021 Ms. Chrisie Boyer [name] a 42 year old female with a past medical history of insulin-dependent uncontrolled type 2 diabetes with last hemoglobin A1c of 13.5, osteomyelitis of the right foot status post transmetatarsal amputation on 12/18/2020 that presents to the clinic for right foot wound. She has had dehiscence of the surgical site. [name] using wet-to-dry dressings. She has a PICC line and receiving IV ceftriaxone daily for her osteomyelitis. There [name] an end date of 01/27/2021. [name] also taking oral metronidazole. She currently denies systemic signs of infection. 1/19; patient presents for follow-up. She was diagnosed with a DVT to the right lower extremity 2 days ago. [name] on Eliquis now. [name] scheduled to see her infectious disease doctor tomorrow. She has been using Dakin's wet-to-dry dressings. She denies systemic signs of infection. 1/26; patient presents for follow-up. She saw infectious disease on 1/21 started on Augmentin. Her PICC line and IV ceftriaxone was discontinued. Patient reports stability to her wound. She has been using Dakin's  wet-to-dry dressings. She currently denies systemic signs of infection. 2/3; patient presents for follow-up. She continues to use Dakin's wet-to-dry dressings to the wound bed. She saw  Dr. Manson Passey with infectious disease yesterday and [name] continuing Augmentin. T entative end date [name] 2/16. Patient reports following up with orthopedics. She states there [name] no further plan from them. She currently denies systemic signs of infection. 2/10; patient presents for follow-up. She continues to use Dakin's wet to dry dressings. [name] scheduled to have her MRI done on 2/14. She states that she had pain to the debridement site from last clinic visit and declines debridement today. She denies systemic signs of infection. She continues to have yellow thick drainage. 2/20; patient presents for follow-up. She continues to use Dakin's wet-to-dry dressings. She obtained her MRI. The results showed an abscess and [name] scheduled to see her orthopedic surgeon on 2/23. She saw infectious disease 2/17 and her antibiotics were extended. She currently denies systemic signs of infection. 3/6; patient presents for follow-up. She had debridement and irrigation of her foot on 03/10/2021 due to abscess noted on MRI. She was started on IV ceftriaxone and oral Flagyl. She has no issues or complaints today. She has been using iodoform packing to the tunnel and Dakin's wet-to-dry to the opening. 03/26/2021: She continues on IV ceftriaxone and oral metronidazole. She has follow-up with infectious disease tomorrow. No significant issues or complaints today. Her mother continues to help her with her wound dressing, using iodoform packing strips into the tunnel and Dakin's to the open portion of the wound. 3/20; patient presents for follow-up. She continues to be on IV ceftriaxone in oral metronidazole. She has been using iodoform to the tunnel and Dakin's wet-to- dry to the open wound. She denies signs of infection. 3/27; patient presents for follow-up. She no longer has a PICC line. She has been using iodoform to the tunnel and Dakin's wet-to-dry to the open wound. She reports improvement in wound healing. She denies  signs of infection. 4/3; patient presents for follow-up. She states she has been using Hydrofera Blue to the open wound and iodoform packing to the tunnel without any issues. She denies signs of infection. SHENIQUA, KOHNEN ([id number]) [id number] Page 7 of 11 4/18; patient presents for follow-up. She saw infectious disease on 4/11. She has finished her oral antibiotics and completed a total of 6 weeks of antibiotics (this includes IV as well). No further antibiotics needed. She has been using Hydrofera Blue and iodoform packing. She states that the tunneled area has come in and the iodoform [name] not staying in place anymore. She has no issues or complaints today. She denies signs of infection. 4/24; patient presents for follow-up. She saw Dr. Carlene Coria, plastic surgery to discuss potential skin graft/substitute placement. At this time he thinks that the skin graft would likely not take. He [name] in agreement with trying a wound VAC. Patient has been using Hydrofera Blue dressing changes with no issues. She denies signs of infection. She reports improvement in wound healing. 5/1; patient presents for follow-up. Unfortunately patient did not have insurance when we ran for the pico. There [name] an assistance program and we are trying to get this accommodated for the patient. In the meantime she has been using Hydrofera Blue without any issues. She denies signs of infection. 5/8; patient presents for follow-up. We have not heard back if pico [name] covered by her insurance. She has been using collagen to the wound bed over the past week.  She denies signs of infection. 5/18; patient presents for follow-up. She has been using collagen to the wound bed without issues. Again we have not heard if pico [name] covered by her insurance. She has no issues or complaints today. 5/23; patient presents for follow-up. She has been using collagen to the wound bed. She has no issues or complaints today. She  obtained the wound VAC from [facility] and brought this in today. She denies signs of infection. 6/1; patient presents for follow-up. She has been using the wound VAC for the past week. She has had this changed twice since she was last here. She reports more maceration to the periwound. She denies signs of infection. 6/7; right TMA site. There are 2 wounds 0 separated by a bridge of healed tissue. The more lateral area has undermining. Both areas have healthy looking granulation at the base but relative the size of the wound [name] fairly deep. There [name] no exposed bone no evidence of infection. Her wound VAC was put on hold last week because of surrounding skin maceration she has been using collagen this week. She has a modified shoe 6/12; patient presents for follow-up. Last week the wound VAC was reinitiated. She had no issues with the wound VAC itself. Today she has maceration again noted to the surrounding skin. She denies signs of infection. 6/27; patient presents for follow-up. She has been using Medihoney to the wound bed. We took a break from the wound VAC because the periwound was macerated. She still has some areas of maceration to the distal foot where there [name] a callus. She currently denies signs of infection. 7/11; patient presents for follow-up. She has been using Medihoney to the wound bed. She has developed some increased warmth and erythema to the lateral aspect of the right foot. She states this [name] occurred over the past week and there [name] increased pain. No drainage noted. 7/17; patient presents for follow-up. She has been using Medihoney to the wound bed. She completed her course of Bactrim. She reports improvement in symptoms. 7/24; Patient presents for follow up. She has been using Medihoney to the wound bed without issues. She completed another course of Bactrim. She reports improvement in her symptoms but still has some mild tenderness to the medial aspect of the foot. 7/31; Patient  presents for follow-up. She has been using Medihoney and Dakin's to the wound bed. She denies signs of infection. 8/14; patient presents for follow-up. She has been using Dakin's wet-to-dry packing strips to the right medial aspect of the amputation site and Medihoney to the anterior site. She has no issues or complaints today. She has started physical therapy. She denies signs of infection. 8/29; patient presents for follow-up. She has been using Dakin's wet-to-dry packing strips to the right medial aspect of the amputation site however this [name] becoming more difficult to place. She did report that she had increased redness and swelling to that site and developed some drainage. It has resolved. She continues with physical therapy. 9/8; patient presents for follow-up. We have been using silver alginate to the tunneled area. She completed her course of antibiotics. She reports no pain, increased swelling or erythema. 9/21; patient presents for follow-up. She has been using gentamicin to the tunneled area. She has no issues or complaints today. She denies signs of infection. 10/2; patient presents for follow-up. [name] scheduled to have her CT scan on 10/9. She currently denies signs of infection. She denies increased warmth, erythema or purulent drainage from  the wound bed. She has been using Dakin's wet-to-dry dressings. 10/12; patient presents for follow-up. She had her CT scan on 10/9 that showed A small irregular rim-enhancing fluid pocket communicating to the overlying soft tissues of the sinus tract compatible with a small abscess. Currently she denies systemic signs of infection. She has been doing Dakin's wet-to-dry packing strips but it [name] hard for her to pack into the narrow opening. 10/30; patient presents for follow-up. Since last clinic visit she has had OR debridement of her Right foot by Dr. Odis Hollingshead Due to abscess noted on CT. She has been using iodoform packing. 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 She will complete 2 weeks of this and continue with oral amoxicillin for the next 6 to 12 months. She follows with Dr. Luciana Axe for this. She currently denies signs of infection. 11/13; patient presents for follow-up. Patient has been using silver alginate with gentamicin to the wound bed. She has no issues or complaints today she. She reports improvement in wound healing. 12/4; patient presents for follow-up. She has been using silver alginate and gentamicin to the wound bed. She has no issues or complaints today. 12/8; patient presents for follow-up. She has been using silver alginate with gentamicin to the wound bed. She presents for her first cast placement. She will be back early next week for her obligatory cast change. 12/12; patient presents for follow-up. We have been using collagen with antibiotic ointment to the wound bed under a total contact cast. She has tolerated this well. [name] improved in wound healing. 12/18; patient presents for follow-up. We have been using collagen with antibiotic ointment under the total contact cast. She has no complaints today. 1/2; The cast was placed at last clinic visit however patient missed her follow-up and this was taken off last week at a nurse visit. Patient has been using packing strips to the medial narrowed wound. She has noted no drainage. 1/22; patient presents for follow-up. She has been keeping the area covered. She reports no drainage. She has had no issues to the previous wound site. She denies any signs of infection including increased warmth, erythema or purulent drainage. 08/08/2022 [name] a 42 year old female with a past medical history of insulin-dependent uncontrolled type 2 diabetes with last hemoglobin A1c of 8.8, chronic osteomyelitis of the right foot status post transmetatarsal amputation on 12/18/2020 that has been previously treated for surgical dehiscence  of this site in our clinic. She presents today 6 months after discharge from our clinic due to healed right foot wound with now reopening of the previous surgical site. [name], [name] ([id number]) [id number] Page 8 of 11 She reports chronic pain to the site however denies purulent drainage, increased warmth or erythema. She noticed the wound opening about 1 month ago. She has been doing physical therapy without significant issues. She has inserts to her shoe however she has been advised to follow-up with [facility] for new custom inserts by her orthopedic surgeon now that she has a new wound. She states she will try and do this. She has been using antibiotic ointment to the wound bed. 8/1; patient presents for follow-up. She has been taking her antibiotics without issues. She has been using Dakin's wet-to-dry dressings to the wound bed. The wound [name] smaller. She denies systemic signs of infection. 8/13; patient presents for follow-up. She had ABIs completed that were normal. On the right it was 1.02 and on the left  was 0.99. She has been using silver alginate to the wound bed with gentamicin ointment. She states it [name] hard to pack the wound bed. She currently denies signs of infection. 8/29; patient presents for follow-up. She has been using collagen with antibiotic ointment to the wound bed. Wound [name] stable. She denies signs of infection. 9/5; patient presents for follow-up. She has been using silver alginate to the wound bed. Wound [name] slightly wider. She denies signs of infection. 9/12; patient presents for follow-up. She has been using silver alginate to the wound bed. Wound [name] slightly smaller today. 9/19; right foot in the setting of a TMA over the first metatarsal head. She has thick callus around this area small fissured wound which actually looks like it [name] attempting to close. She has a offloading shoe I am not sure how much activity [name] in. I did not have a lot of  time to see her today because of transportation 9/26; right foot in the setting of a previous TMA. The wound [name] over the plantar first metatarsal head. This [name] a fissured wound with thick callus around the margins. It [name] small in terms of overall surface area but has a depth of about 0.6 cm there was no palpable bone. She has a history of chronic osteomyelitis although she has been treated for this I have reviewed CT scans from 2023 also an MRI from 10/26/2021. Neither imaging study showed osteomyelitis in this area although the CT scan suggested osteomyelitis in the fourth metatarsal head. There has been problems with abscesses communicating with the wound although there does not appear to be purulent drainage currently 10/3; right foot at the TMA site. Small probing wound. I changed her to Iodosorb with gauze last time. May be some improvement. She has a depth of about 0.6 cm but there [name] no palpable bone. She has been previously treated for chronic osteomyelitis in this area. 10/17; this [name] a patient with a probing wound at the right TMA site. Underlying osteomyelitis previously treated earlier this year. I been using Iodosorb to try and help constrict this wound however today she comes in with a larger circumference a larger tunnel and this probably sits right on top of bone. Also worrisome [name] there was a streaking dark area just caudal to the wound on the dorsal surface of the foot. The patient [name] complaining of more pain but no fever. 10/24; culture of this area [bone scraping] revealed Acinetobacter and 2 coag negative staph. I am going to give her Septra DS 1 p.o. twice daily for 2 weeks. I think it would be simplistic to call this a bone culture but it certainly was a deep culture. An x-ray was done but that has not been reported yet. I had wanted to get lab work including a CBC with differential ESR and C-reactive protein but I am not actually sure that got done last week. We have been using  gent and silver alginate. 11/7; patient [name] still taking trimethoprim/sulfamethoxazole for the assessment of bacterial identified last week. Lab work revealed a C-reactive protein of 4.1 which [name] elevated from 1.7 but not dramatic. White count was normal at 8.4 x-ray of the right foot revealed minimal new lucency and cortical erosion within the adjacent distal plantar aspect of the metatarsals concerning for acute osteomyelitis early. 11/19; the patient has completed her Septra DS which I gave to her for 2 weeks. She has her MRI tomorrow but then we will have the obligatory 5 to  10- day/workday wait to get a report. I am going to resume her Septra DS for a further 2 weeks. Then decide on additional antibiotics, infectious disease etc. 12/5; the patient has completed the Septra DS for the originally cultured as Enterobacter on a bone scraping. Her MRI somewhat surprisingly showed status post amputation of the 1st through 5th metatarsal base but no evidence of osteomyelitis. Surrounding skin thickening with mild edema could represent cellulitis. Previous x-ray showed the suggestion of bone erosion although this was not verified by her MRI. 12/9; patient comes in today for follow-up. She tolerated the total contact cast well. The areas on the right TMA laterally. Medially she has undermining. What I can see of the granulation looks healthy. We are using Prisma and we placed the total contact cast again last week 12/17;. She has a area on the right TMA medially. This seems to have contracted quite a bit this week. There [name] still 1 cm of depth. No palpable bone. No purulent drainage. Again MRI did not suggest underlying osteomyelitis. She has completed the antibiotics I gave her for S Enterobacter. We put her back in a total contact cast last week using Prisma as the primary dressing 12/23; right TMA medially. This [name] a weightbearing surface we now have her in a total contact cast. We have been using Prisma  and gentamicin. The gentamicin to target S adeno bacteria. [name] completed a course of Septra. MRI did not underlying osteomyelitis. Objective Constitutional Sitting or standing Blood Pressure [name] within target range for patient.. Pulse regular and within target range for patient.Marland Kitchen Respirations regular, non-labored and within target range.. Temperature [name] normal and within the target range for the patient.Marland Kitchen Appears in no distress. Vitals Time Taken: 1:22 PM, Height: 69 in, Weight: 330 lbs, BMI: 48.7, Temperature: 98.4 F, Pulse: 92 bpm, Respiratory Rate: 18 breaths/min, Blood Pressure: 115/73 mmHg. General Notes: Wound exam; medial first metatarsal head at right TMA site. There [name] undermining still about 1 cm. I was able to remove some circumferential thick callus skin from the inferior recess of this. This does not probe to bone. No evidence of surrounding infection Integumentary (Hair, Skin) Wound #4 status [name] Open. Original cause of wound was Shear/Friction. The date acquired was: 07/08/2022. The wound has been in treatment 21 weeks. The wound [name] located on the Right Amputation Site - Transmetatarsal. The wound measures 1.1cm length x 0.4cm width x 1cm depth; 0.346cm^2 area and 0.346cm^3 volume. There [name] Fat Layer (Subcutaneous Tissue) exposed. There [name] no tunneling noted, however, there [name] undermining starting at 12:00 and ending at 6:00 with a maximum distance of 1cm. There [name] a large amount of serosanguineous drainage noted. The wound margin [name] thickened. There [name] large (67-100%) red, pink granulation within the wound bed. There [name] a small (1-33%) amount of necrotic tissue within the wound bed including Adherent Slough. The periwound [name], [name] ([id number]) [id number] Page 9 of 11 skin appearance had no abnormalities noted for color. The periwound skin appearance exhibited: Callus, Maceration. The periwound skin appearance did not exhibit: Crepitus, Excoriation,  Induration, Rash, Scarring, Dry/Scaly. Periwound temperature was noted as No Abnormality. The periwound has tenderness on palpation. Assessment Active Problems ICD-10 Non-pressure chronic ulcer of other part of right foot with fat layer exposed Type 2 diabetes mellitus with foot ulcer Other chronic osteomyelitis, right ankle and foot Procedures Wound #4 Pre-procedure diagnosis of Wound #4 [name] a Diabetic Wound/Ulcer of the Lower Extremity located on the Right Amputation Site - Transmetatarsal .  Severity of Tissue Pre Debridement [name]: Fat layer exposed. There was a Excisional Skin/Subcutaneous Tissue Debridement with a total area of 0.35 sq cm performed by Maxwell Caul., MD. With the following instrument(s): Curette to remove Viable and Non-Viable tissue/material. Material removed includes Callus, Subcutaneous Tissue, Slough, Skin: Dermis, and Skin: Epidermis after achieving pain control using Lidocaine 4% T opical Solution. A time out was conducted at 14:00, prior to the start of the procedure. A Minimum amount of bleeding was controlled with Pressure. The procedure was tolerated well with a pain level of 0 throughout and a pain level of 0 following the procedure. Post Debridement Measurements: 1.1cm length x 0.4cm width x 1cm depth; 0.346cm^3 volume. Character of Wound/Ulcer Post Debridement [name] improved. Severity of Tissue Post Debridement [name]: Fat layer exposed. 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 There was a T Contact Cast Procedure by Maxwell Caul., MD. otal Post procedure Diagnosis Wound #4: Same as Pre-Procedure Notes: size 4. Plan Follow-up Appointments: Return Appointment in 1 week. - Dr.Cannon covering for Dr. Leanord Hawking 01/13/2023 3pm (already scheduled) Return Appointment in 2 weeks. - ***Dr. Leonidas Romberg week of  January 2025 (front office to schedule) Return appointment in 3 weeks. - ***Dr. Hoffman****January 2025 (front office to schedule) Return appointment in 1 month. - ***Dr. Hoffman****January 2025 (front office to schedule) Anesthetic: (In clinic) Topical Lidocaine 5% applied to wound bed Bathing/ Shower/ Hygiene: May shower with protection but do not get wound dressing(s) wet. Protect dressing(s) with water repellant cover (for example, large plastic bag) or a cast cover and may then take shower. Other Bathing/Shower/Hygiene Orders/Instructions: - When changing the dressing (every other day) you may get right foot wet. On days NOT changing the dressing.Keep wound/dressing dry Off-Loading: T Contact Cast to Right Lower Extremity - size 4 otal WOUND #4: - Amputation Site - Transmetatarsal Wound Laterality: Right Cleanser: Soap and Water 1 x Per Week/30 Days Discharge Instructions: May shower and wash wound with dial antibacterial soap and water prior to dressing change. On days not changing the dressing, please keep wound/dressing dry Topical: Gentamicin 1 x Per Week/30 Days Discharge Instructions: As directed by physician Prim Dressing: Promogran Prisma Matrix, 4.34 (sq in) (silver collagen) 1 x Per Week/30 Days ary Discharge Instructions: Moisten collagen with saline or hydrogel Secondary Dressing: ABD Pad, 8x10 1 x Per Week/30 Days Discharge Instructions: Apply over primary dressing as directed. Secondary Dressing: Woven Gauze Sponge, Non-Sterile 4x4 in 1 x Per Week/30 Days Discharge Instructions: Apply over primary dressing as directed. Secondary Dressing: Zetuvit Plus 4x8 in 1 x Per Week/30 Days Discharge Instructions: Apply over primary dressing as directed. Secured With: [employer], 4.5x3.1 (in/yd) 1 x Per Week/30 Days Discharge Instructions: Secure with Kerlix as directed. Secured With: 25M Medipore H Soft Cloth Surgical T ape, 4 x 10 (in/yd) 1 x Per Week/30 Days Discharge  Instructions: Secure with tape as directed. Secured With: total contact cast size 4 1 x Per Week/30 Days Discharge Instructions: last layer applied by provider. 1. I am continuing the gentamicin and Prisma for another week. #2 if this does not feel it at all might need to consider changing the primary dressing. 3. She has Medicaid and therefore not eligible for anything but there [name] skin which would not be easy to get in this area. 4. T contact cast [name] tolerating well otal [name],  [name] ([id number]) [id number] Page 10 of 11 Electronic Signature(s) Signed: 01/07/2023 11:51:18 AM By: Baltazar Najjar MD Entered By: Baltazar Najjar on 01/06/2023 11:21:24 -------------------------------------------------------------------------------- Total Contact Cast Details Patient Name: Date of Service: [name] Seth Bake IS, [name]. 01/06/2023 1:00 PM Medical Record Number: [id number] Patient Account Number: [id number] Date of Birth/Sex: Treating RN: [birth year] (42 y.o. F) Primary Care Provider: Gwinda Passe Other Clinician: Referring Provider: Treating Provider/Extender: Aurelio Brash in Treatment: 21 T Contact Cast Applied for Wound Assessment: otal Wound #4 Right Amputation Site - Transmetatarsal Performed By: Physician Maxwell Caul., MD The following information was scribed by: Shawn Stall The information was scribed for: Baltazar Najjar Post Procedure Diagnosis Same as Pre-procedure Notes size 4 Electronic Signature(s) Signed: 01/07/2023 11:51:18 AM By: Baltazar Najjar MD Entered By: Baltazar Najjar on 01/06/2023 11:13:22 -------------------------------------------------------------------------------- SuperBill Details Patient Name: Date of Service: [name] [name] Kitten, [name]. 01/06/2023 Medical Record Number: [id number] Patient Account Number: [id number] Date of Birth/Sex: Treating RN: Jan 01, 1981 (42 y.o. 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

## 2023-01-09 NOTE — Progress Notes (Signed)
ADALAIDE, GUIDERA ([id number]) [id number] Page 1 of 9 Visit Report for 01/06/2023 Arrival Information Details Patient Name: Date of Service: [name] IS, [place] [name] 01/06/2023 1:00 PM Medical Record Number: [id number] Patient Account Number: [id number] Date of Birth/Sex: Treating RN: 09/15/1980 (42 y.o. F) Primary Care Nury Nebergall: Gwinda Passe Other Clinician: Referring Colman Birdwell: Treating Theodis Kinsel/Extender: Aurelio Brash in Treatment: 21 Visit Information History Since Last Visit Added or deleted any medications: No Patient Arrived: Wheel Chair Any new allergies or adverse reactions: No Arrival Time: 13:22 Had a fall or experienced change in No Accompanied By: self activities of daily living that may affect Transfer Assistance: None risk of falls: Patient Identification Verified: Yes Signs or symptoms of abuse/neglect since last No Secondary Verification Process Completed: Yes visito Patient Requires Transmission-Based Precautions: No Hospitalized since last visit: No Patient Has Alerts: Yes Implantable device outside of the clinic No Patient Alerts: Patient on Blood Thinner excluding Eliquis cellular tissue based products placed in the ABI R 1.02 (08/21/22) center ABI L 0.99 (08/21/22) since last visit: Has Dressing in Place as Prescribed: Yes Has Footwear/Offloading in Place as Yes Prescribed: Right: Removable Cast Walker/Walking Boot T Contact Cast otal Pain Present Now: No Electronic Signature(s) Signed: 01/09/2023 4:00:31 PM By: Thayer Dallas Entered By: Thayer Dallas on 01/06/2023 10:22:43 -------------------------------------------------------------------------------- Complex / Palliative Patient Assessment Details Patient Name: Date of Service: [name], [name] [name] 01/06/2023 1:00 PM Medical Record Number: [id number] Patient Account Number: [id number] Date of Birth/Sex: Treating RN: [birth year] (42 y.o. [name] Primary Care Jabria Loos: Gwinda Passe Other Clinician: Referring Kyan Giannone: Treating Shereka Lafortune/Extender: Aurelio Brash in Treatment: 21 Complex Wound Management Criteria Patient has remarkable or complex co-morbidities requiring medications or treatments that extend wound healing times. Examples: Diabetes mellitus with chronic renal failure or end stage renal disease requiring dialysis Advanced or poorly controlled rheumatoid arthritis Diabetes mellitus and end stage chronic obstructive pulmonary disease Active cancer with current chemo- or radiation therapy DM Type II, obesity, DVT HTN, 2022 osteomyelitis R foot- transmet, 2023 abscess R foot. , Palliative Wound Management Criteria Care Approach Wound Care Plan: Complex Wound Management Electronic Signature(s) KALICIA, WICHT L ([id number]) [id number] Page 2 of 9 Signed: 01/09/2023 3:32:45 PM By: Shawn Stall RN, BSN Signed: 01/09/2023 4:36:22 PM By: Baltazar Najjar MD Entered By: Shawn Stall on 01/09/2023 12:32:45 -------------------------------------------------------------------------------- Encounter Discharge Information Details Patient Name: Date of Service: [name], [name] [name] 01/06/2023 1:00 PM Medical Record Number: [id number] Patient Account Number: [id number] Date of Birth/Sex: Treating RN: 01-Apr-1980 (42 y.o. [name] Primary Care Sion Thane: Gwinda Passe Other Clinician: Referring Azora Bonzo: Treating Makenzi Bannister/Extender: Aurelio Brash in Treatment: 21 Encounter Discharge Information Items Post Procedure Vitals Discharge Condition: Stable Temperature (F): 98.4 Ambulatory Status: Wheelchair Pulse (bpm): 92 Discharge Destination: Home Respiratory Rate (breaths/min): 18 Transportation: Private Auto Blood Pressure (mmHg): 115/73 Accompanied By: self Schedule Follow-up Appointment: Yes Clinical Summary of Care: Electronic  Signature(s) Signed: 01/06/2023 4:50:55 PM By: Shawn Stall RN, BSN Entered By: Shawn Stall on 01/06/2023 11:10:48 -------------------------------------------------------------------------------- Lower Extremity Assessment Details Patient Name: Date of Service: [name], [name] [name] 01/06/2023 1:00 PM Medical Record Number: [id number] Patient Account Number: [id number] Date of Birth/Sex: Treating RN: 06-18-1980 (42 y.o. F) Primary Care Jenel Gierke: Gwinda Passe Other Clinician: Referring Kaz Auld: Treating Karalee Hauter/Extender: Aurelio Brash in Treatment: 21 Edema Assessment Assessed: [Left: No] Franne Forts: No] Edema: [Left: Ye] [Right: s] Calf Left: Right: Point of Measurement: 31 cm From  Medial Instep 48.5 cm Ankle Left: Right: Point of Measurement: 9 cm From Medial Instep 29.5 cm Vascular Assessment Extremity colors, hair growth, and conditions: Extremity Color: [Right:Hyperpigmented] Hair Growth on Extremity: [Right:No] Temperature of Extremity: [Right:Warm] Capillary Refill: [Right:< 3 seconds] Dependent Rubor: [Right:No] Lipodermatosclerosis: [Right:Yes [id number] Page 3 of 9] Electronic Signature(s) Signed: 01/09/2023 4:00:31 PM By: Thayer Dallas Entered By: Thayer Dallas on 01/06/2023 10:34:46 -------------------------------------------------------------------------------- Multi Wound Chart Details Patient Name: Date of Service: [name], [name] [name] 01/06/2023 1:00 PM Medical Record Number: [id number] Patient Account Number: [id number] Date of Birth/Sex: Treating RN: [birth year] (42 y.o. F) Primary Care Corneshia Hines: Gwinda Passe Other Clinician: Referring Alencia Gordon: Treating Shemica Meath/Extender: Aurelio Brash in Treatment: 21 Vital Signs Height(in): 69 Pulse(bpm): 92 Weight(lbs): 330 Blood Pressure(mmHg): 115/73 Body Mass Index(BMI): 48.7 Temperature(F): 98.4 Respiratory  Rate(breaths/min): 18 [4:Photos:] [N/A:N/A] Right Amputation Site - N/A N/A Wound Location: Transmetatarsal Shear/Friction N/A N/A Wounding Event: Diabetic Wound/Ulcer of the Lower N/A N/A Primary Etiology: Extremity Dehisced Wound N/A N/A Secondary Etiology: Deep Vein Thrombosis, Hypertension, N/A N/A Comorbid History: Type II Diabetes, Osteomyelitis, Neuropathy 07/08/2022 N/A N/A Date Acquired: 21 N/A N/A Weeks of Treatment: Open N/A N/A Wound Status: No N/A N/A Wound Recurrence: 1.1x0.4x1 N/A N/A Measurements L x W x D (cm) 0.346 N/A N/A A (cm) : rea 0.346 N/A N/A Volume (cm) : -57.30% N/A N/A % Reduction in A rea: -96.60% N/A N/A % Reduction in Volume: 12 Starting Position 1 (o'clock): 6 Ending Position 1 (o'clock): 1 Maximum Distance 1 (cm): Yes N/A N/A Undermining: Grade 3 N/A N/A Classification: Large N/A N/A Exudate A mount: Serosanguineous N/A N/A Exudate Type: red, brown N/A N/A Exudate Color: Thickened N/A N/A Wound Margin: Large (67-100%) N/A N/A Granulation A mount: Red, Pink N/A N/A Granulation Quality: Small (1-33%) N/A N/A Necrotic A mount: Fat Layer (Subcutaneous Tissue): Yes N/A N/A Exposed Structures: Fascia: No Tendon: No Muscle: No Joint: No Bone: No Small (1-33%) N/A N/A Epithelialization: Debridement - Excisional N/A N/A Debridement: KRYSTYNE, DOWIE ([id number]) [id number] Page 4 of 9 14:00 N/A N/A Pre-procedure Verification/Time Out Taken: Lidocaine 4% Topical Solution N/A N/A Pain Control: Callus, Subcutaneous, Slough N/A N/A Tissue Debrided: Skin/Subcutaneous Tissue N/A N/A Level: 0.35 N/A N/A Debridement A (sq cm): rea Curette N/A N/A Instrument: Minimum N/A N/A Bleeding: Pressure N/A N/A Hemostasis A chieved: 0 N/A N/A Procedural Pain: 0 N/A N/A Post Procedural Pain: Procedure was tolerated well N/A N/A Debridement Treatment Response: 1.1x0.4x1 N/A N/A Post Debridement  Measurements L x W x D (cm) 0.346 N/A N/A Post Debridement Volume: (cm) Callus: Yes N/A N/A Periwound Skin Texture: Excoriation: No Induration: No Crepitus: No Rash: No Scarring: No Maceration: Yes N/A N/A Periwound Skin Moisture: Dry/Scaly: No Atrophie Blanche: No N/A N/A Periwound Skin Color: Cyanosis: No Ecchymosis: No Erythema: No Hemosiderin Staining: No Mottled: No Pallor: No Rubor: No No Abnormality N/A N/A Temperature: Yes N/A N/A Tenderness on Palpation: Debridement N/A N/A Procedures Performed: T Contact Cast otal Treatment Notes Wound #4 (Amputation Site - Transmetatarsal) Wound Laterality: Right Cleanser Soap and Water Discharge Instruction: May shower and wash wound with dial antibacterial soap and water prior to dressing change. On days not changing the dressing, please keep wound/dressing dry Peri-Wound Care Topical Gentamicin Discharge Instruction: As directed by physician Primary Dressing Promogran Prisma Matrix, 4.34 (sq in) (silver collagen) Discharge Instruction: Moisten collagen with saline or hydrogel Secondary Dressing ABD Pad, 8x10 Discharge Instruction: Apply over primary dressing as directed. Woven Gauze Sponge, Non-Sterile 4x4 in  Discharge Instruction: Apply over primary dressing as directed. Zetuvit Plus 4x8 in Discharge Instruction: Apply over primary dressing as directed. Secured With [employer], 4.5x3.1 (in/yd) Discharge Instruction: Secure with Kerlix as directed. 58M Medipore H Soft Cloth Surgical T ape, 4 x 10 (in/yd) Discharge Instruction: Secure with tape as directed. total contact cast size 4 Discharge Instruction: last layer applied by Kynzee Devinney. Compression Wrap Compression Stockings Add-Ons [name], [name] ([id number]) [id number] Page 5 of 9 Electronic Signature(s) Signed: 01/07/2023 11:51:18 AM By: Baltazar Najjar MD Entered By: Baltazar Najjar on 01/06/2023  11:12:38 -------------------------------------------------------------------------------- Multi-Disciplinary Care Plan Details Patient Name: Date of Service: [name] IS, [name] [name] 01/06/2023 1:00 PM Medical Record Number: [id number] Patient Account Number: [id number] Date of Birth/Sex: Treating RN: November 29, 1980 (42 y.o. [name], [name] Primary Care Mikalah Skyles: Gwinda Passe Other Clinician: Referring Dearia Wilmouth: Treating Marialuisa Basara/Extender: Aurelio Brash in Treatment: 21 Active Inactive Nutrition Nursing Diagnoses: Impaired glucose control: actual or potential Goals: Patient/caregiver agrees to and verbalizes understanding of need to obtain nutritional consultation Date Initiated: 08/08/2022 Date Inactivated: 12/31/2022 Target Resolution Date: 01/13/2024 Goal Status: Met Patient/caregiver will maintain therapeutic glucose control Date Initiated: 08/08/2022 Target Resolution Date: 01/13/2024 Goal Status: Active Interventions: Assess HgA1c results as ordered upon admission and as needed Provide education on elevated blood sugars and impact on wound healing Provide education on nutrition Treatment Activities: Obtain HgA1c : 08/08/2022 Patient referred to Primary Care Physician for further nutritional evaluation : 08/08/2022 Notes: Pain, Acute or Chronic Nursing Diagnoses: Pain, acute or chronic: actual or potential Potential alteration in comfort, pain Goals: Patient will verbalize adequate pain control and receive pain control interventions during procedures as needed Date Initiated: 08/08/2022 Date Inactivated: 12/31/2022 Target Resolution Date: 01/13/2024 Goal Status: Met Patient/caregiver will verbalize comfort level met Date Initiated: 08/08/2022 Target Resolution Date: 01/13/2024 Goal Status: Active Interventions: Complete pain assessment as per visit requirements Encourage patient to take pain medications as prescribed Provide education on pain  management Treatment Activities: Administer pain control measures as ordered : 08/08/2022 Notes: Wound/Skin Impairment Nursing Diagnoses: Knowledge deficit related to ulceration/compromised skin integrity [name], [name] L ([id number]) [id number] Page 6 of 9 Goals: Patient/caregiver will verbalize understanding of skin care regimen Date Initiated: 08/08/2022 Date Inactivated: 12/31/2022 Target Resolution Date: 01/13/2024 Goal Status: Met Ulcer/skin breakdown will heal within 14 weeks Date Initiated: 08/08/2022 Target Resolution Date: 01/13/2024 Goal Status: Active Interventions: Assess patient/caregiver ability to perform ulcer/skin care regimen upon admission and as needed Assess ulceration(s) every visit Provide education on ulcer and skin care Treatment Activities: Skin care regimen initiated : 08/08/2022 Topical wound management initiated : 08/08/2022 Notes: Electronic Signature(s) Signed: 01/06/2023 4:50:55 PM By: Shawn Stall RN, BSN Entered By: Shawn Stall on 01/06/2023 10:59:57 -------------------------------------------------------------------------------- Pain Assessment Details Patient Name: Date of Service: [name], [name] [name] 01/06/2023 1:00 PM Medical Record Number: [id number] Patient Account Number: [id number] Date of Birth/Sex: Treating RN: 1980/11/04 (42 y.o. F) Primary Care Andoni Busch: Gwinda Passe Other Clinician: Referring Jerren Flinchbaugh: Treating Neftali Thurow/Extender: Aurelio Brash in Treatment: 21 Active Problems Location of Pain Severity and Description of Pain Patient Has Paino No Site Locations Pain Management and Medication Current Pain Management: Electronic Signature(s) Signed: 01/09/2023 4:00:31 PM By: Thayer Dallas Entered By: Thayer Dallas on 01/06/2023 10:23:08 [name], [name] L ([id number]) [id number] Page 7 of  9 -------------------------------------------------------------------------------- Patient/Caregiver Education Details Patient Name: Date of Service: [name] IS, [name] [name] 12/23/2024andnbsp1:00 PM Medical Record Number: [id number] Patient Account Number: [id number] Date of Birth/Gender: Treating RN: [birth year] [phone]  y.o. [name] Primary Care Physician: Gwinda Passe Other Clinician: Referring Physician: Treating Physician/Extender: Aurelio Brash in Treatment: 21 Education Assessment Education Provided To: Patient Education Topics Provided Wound/Skin Impairment: Handouts: Caring for Your Ulcer Methods: Explain/Verbal Responses: Reinforcements needed Electronic Signature(s) Signed: 01/06/2023 4:50:55 PM By: Shawn Stall RN, BSN Entered By: Shawn Stall on 01/06/2023 11:00:12 -------------------------------------------------------------------------------- Wound Assessment Details Patient Name: Date of Service: [name], [name] [name] 01/06/2023 1:00 PM Medical Record Number: [id number] Patient Account Number: [id number] Date of Birth/Sex: Treating RN: 1980-05-03 (42 y.o. F) Primary Care Kamrynn Melott: Gwinda Passe Other Clinician: Referring Daneya Hartgrove: Treating Tomeika Weinmann/Extender: Aurelio Brash in Treatment: 21 Wound Status Wound Number: 4 Primary Diabetic Wound/Ulcer of the Lower Extremity Etiology: Wound Location: Right Amputation Site - Transmetatarsal Secondary Dehisced Wound Wounding Event: Shear/Friction Etiology: Date Acquired: 07/08/2022 Wound Status: Open Weeks Of Treatment: 21 Comorbid Deep Vein Thrombosis, Hypertension, Type II Diabetes, Clustered Wound: No History: Osteomyelitis, Neuropathy Photos Wound Measurements Length: (cm) 1.1 [name], [name] L ([id number]) Width: (cm) Depth: (cm) Area: (cm) Volume: (cm) % Reduction in Area: -57.3% [id number] Page 8 of 9 0.4 % Reduction  in Volume: -96.6% 1 Epithelialization: Small (1-33%) 0.346 Tunneling: No 0.346 Undermining: Yes Starting Position (o'clock): 12 Ending Position (o'clock): 6 Maximum Distance: (cm) 1 Wound Description Classification: Grade 3 Wound Margin: Thickened Exudate Amount: Large Exudate Type: Serosanguineous Exudate Color: red, brown Foul Odor After Cleansing: No Slough/Fibrino Yes Wound Bed Granulation Amount: Large (67-100%) Exposed Structure Granulation Quality: Red, Pink Fascia Exposed: No Necrotic Amount: Small (1-33%) Fat Layer (Subcutaneous Tissue) Exposed: Yes Necrotic Quality: Adherent Slough Tendon Exposed: No Muscle Exposed: No Joint Exposed: No Bone Exposed: No Periwound Skin Texture Texture Color No Abnormalities Noted: No No Abnormalities Noted: Yes Callus: Yes Temperature / Pain Crepitus: No Temperature: No Abnormality Excoriation: No Tenderness on Palpation: Yes Induration: No Rash: No Scarring: No Moisture No Abnormalities Noted: No Dry / Scaly: No Maceration: Yes Treatment Notes Wound #4 (Amputation Site - Transmetatarsal) Wound Laterality: Right Cleanser Soap and Water Discharge Instruction: May shower and wash wound with dial antibacterial soap and water prior to dressing change. On days not changing the dressing, please keep wound/dressing dry Peri-Wound Care Topical Gentamicin Discharge Instruction: As directed by physician Primary Dressing Promogran Prisma Matrix, 4.34 (sq in) (silver collagen) Discharge Instruction: Moisten collagen with saline or hydrogel Secondary Dressing ABD Pad, 8x10 Discharge Instruction: Apply over primary dressing as directed. Woven Gauze Sponge, Non-Sterile 4x4 in Discharge Instruction: Apply over primary dressing as directed. Zetuvit Plus 4x8 in Discharge Instruction: Apply over primary dressing as directed. Secured With [employer], 4.5x3.1 (in/yd) Discharge Instruction: Secure with Kerlix as  directed. 55M Medipore H Soft Cloth Surgical T ape, 4 x 10 (in/yd) Discharge Instruction: Secure with tape as directed. total contact cast size 4 Discharge Instruction: last layer applied by Yzabelle Calles. NEHIR, QIU ([id number]) [id number] Page 9 of 9 Compression Wrap Compression Stockings Add-Ons Electronic Signature(s) Signed: 01/09/2023 4:00:31 PM By: Thayer Dallas Entered By: Thayer Dallas on 01/06/2023 10:38:34 -------------------------------------------------------------------------------- Vitals Details Patient Name: Date of Service: [name], [name] [name] 01/06/2023 1:00 PM Medical Record Number: [id number] Patient Account Number: [id number] Date of Birth/Sex: Treating RN: [birth year] (42 y.o. F) Primary Care Cara Thaxton: Gwinda Passe Other Clinician: Referring Kayler Buckholtz: Treating [name]/Extender: Aurelio Brash in Treatment: 21 Vital Signs Time Taken: 13:22 Temperature (F): 98.4 Height (in): 69 Pulse (bpm): 92 Weight (lbs): 330 Respiratory Rate (breaths/min): 18 Body Mass Index (BMI): 48.7  Blood Pressure (mmHg): 115/73 Reference Range: 80 - 120 mg / dl Electronic Signature(s) Signed: 01/09/2023 4:00:31 PM By: Thayer Dallas Entered By: Thayer Dallas on 01/06/2023 10:23:02

## 2023-01-13 ENCOUNTER — Other Ambulatory Visit: Payer: Self-pay

## 2023-01-13 ENCOUNTER — Encounter (HOSPITAL_BASED_OUTPATIENT_CLINIC_OR_DEPARTMENT_OTHER): Payer: Medicaid Other | Admitting: General Surgery

## 2023-01-13 ENCOUNTER — Other Ambulatory Visit: Payer: Self-pay | Admitting: Family Medicine

## 2023-01-13 DIAGNOSIS — E11621 Type 2 diabetes mellitus with foot ulcer: Secondary | ICD-10-CM | POA: Diagnosis not present

## 2023-01-13 MED ORDER — TRULICITY 1.5 MG/0.5ML ~~LOC~~ SOAJ
1.5000 mg | SUBCUTANEOUS | 0 refills | Status: DC
  Filled 2023-01-13: qty 2, 28d supply, fill #0
  Filled 2023-03-15: qty 2, 28d supply, fill #1

## 2023-01-13 NOTE — Progress Notes (Signed)
SYRAI, MAINA ([id number]) [id number] Page 1 of 8 Visit Report for 01/13/2023 Arrival Information Details Patient Name: Date of Service: [name], [place] [name]. 01/13/2023 3:00 PM Medical Record Number: [id number] Patient Account Number: [id number] Date of Birth/Sex: Treating RN: [birth year] (42 y.o. F) Primary Care Jammie Clink: Gwinda Passe Other Clinician: Referring Alexzandra Bilton: Treating Chantell Kunkler/Extender: Sharyl Nimrod in Treatment: 22 Visit Information History Since Last Visit Added or deleted any medications: No Patient Arrived: Wheel Chair Any new allergies or adverse reactions: No Arrival Time: 15:21 Had a fall or experienced change in No Accompanied By: mother activities of daily living that may affect Transfer Assistance: None risk of falls: Patient Identification Verified: Yes Signs or symptoms of abuse/neglect since last visito No Secondary Verification Process Completed: Yes Hospitalized since last visit: No Patient Requires Transmission-Based Precautions: No Implantable device outside of the clinic excluding No Patient Has Alerts: Yes cellular tissue based products placed in the center Patient Alerts: Patient on Blood Thinner since last visit: Eliquis Pain Present Now: No ABI R 1.02 (08/21/22) ABI [name] 0.99 (08/21/22) Electronic Signature(s) Signed: 01/13/2023 4:04:56 PM By: Dayton Scrape Entered By: Dayton Scrape on 01/13/2023 12:22:00 -------------------------------------------------------------------------------- Encounter Discharge Information Details Patient Name: Date of Service: [name], [name]. 01/13/2023 3:00 PM Medical Record Number: [id number] Patient Account Number: [id number] Date of Birth/Sex: Treating RN: 1980-09-28 (42 y.o. Katrinka Blazing Primary Care Shunte Senseney: Gwinda Passe Other Clinician: Referring Vira Chaplin: Treating Warrene Kapfer/Extender: Sharyl Nimrod in Treatment:  22 Encounter Discharge Information Items Post Procedure Vitals Discharge Condition: Stable Temperature (F): 97.8 Ambulatory Status: Wheelchair Pulse (bpm): 96 Discharge Destination: Home Respiratory Rate (breaths/min): 18 Transportation: Private Auto Blood Pressure (mmHg): 117/74 Accompanied By: self Schedule Follow-up Appointment: Yes Clinical Summary of Care: Patient Declined Electronic Signature(s) Signed: 01/13/2023 4:40:29 PM By: Karie Schwalbe RN Entered By: Karie Schwalbe on 01/13/2023 13:31:54 [name], [name] ([id number]) [id number] Page 2 of 8 -------------------------------------------------------------------------------- Lower Extremity Assessment Details Patient Name: Date of Service: [name], [name]. 01/13/2023 3:00 PM Medical Record Number: [id number] Patient Account Number: [id number] Date of Birth/Sex: Treating RN: 03-11-1980 (42 y.o. Katrinka Blazing Primary Care Amariya Liskey: Gwinda Passe Other Clinician: Referring Shivaan Tierno: Treating Lariza Cothron/Extender: Sharyl Nimrod in Treatment: 22 Edema Assessment Assessed: [Left: No] [Right: No] Edema: [Left: Ye] [Right: s] Calf Left: Right: Point of Measurement: 31 cm From Medial Instep 48.5 cm Ankle Left: Right: Point of Measurement: 9 cm From Medial Instep 29.5 cm Vascular Assessment Pulses: Dorsalis Pedis Palpable: [Right:Yes] Extremity colors, hair growth, and conditions: Extremity Color: [Right:Hyperpigmented] Hair Growth on Extremity: [Right:No] Temperature of Extremity: [Right:Warm] Capillary Refill: [Right:< 3 seconds] Dependent Rubor: [Right:No Yes] Electronic Signature(s) Signed: 01/13/2023 4:40:29 PM By: Karie Schwalbe RN Entered By: Karie Schwalbe on 01/13/2023 12:47:26 -------------------------------------------------------------------------------- Multi Wound Chart Details Patient Name: Date of Service: [name], [name]. 01/13/2023  3:00 PM Medical Record Number: [id number] Patient Account Number: [id number] Date of Birth/Sex: Treating RN: 01/26/1980 (42 y.o. F) Primary Care Anahi Belmar: Gwinda Passe Other Clinician: Referring Illyria Sobocinski: Treating Shantelle Alles/Extender: Sharyl Nimrod in Treatment: 22 Vital Signs Height(in): 69 Pulse(bpm): 96 Weight(lbs): 330 Blood Pressure(mmHg): 117/74 Body Mass Index(BMI): 48.7 Temperature(F): 97.8 Respiratory Rate(breaths/min): 18 [4:Photos:] [N/A:N/A [id number] Page 3 of 8] Right Amputation Site - N/A N/A Wound Location: Transmetatarsal Shear/Friction N/A N/A Wounding Event: Diabetic Wound/Ulcer of the Lower N/A N/A Primary Etiology: Extremity Dehisced Wound N/A N/A Secondary Etiology: Deep Vein Thrombosis, Hypertension, N/A N/A Comorbid History: Type  II Diabetes, Osteomyelitis, Neuropathy 07/08/2022 N/A N/A Date Acquired: 22 N/A N/A Weeks of Treatment: Open N/A N/A Wound Status: No N/A N/A Wound Recurrence: 1.1x0.5x0.7 N/A N/A Measurements [name] x W x D (cm) 0.432 N/A N/A A (cm) : rea 0.302 N/A N/A Volume (cm) : -96.40% N/A N/A % Reduction in A rea: -71.60% N/A N/A % Reduction in Volume: 12 Starting Position 1 (o'clock): 6 Ending Position 1 (o'clock): 0.8 Maximum Distance 1 (cm): Yes N/A N/A Undermining: Grade 3 N/A N/A Classification: Large N/A N/A Exudate A mount: Serosanguineous N/A N/A Exudate Type: red, brown N/A N/A Exudate Color: Thickened N/A N/A Wound Margin: Large (67-100%) N/A N/A Granulation A mount: Red, Pink N/A N/A Granulation Quality: Small (1-33%) N/A N/A Necrotic A mount: Fat Layer (Subcutaneous Tissue): Yes N/A N/A Exposed Structures: Fascia: No Tendon: No Muscle: No Joint: No Bone: No Small (1-33%) N/A N/A Epithelialization: Debridement - Excisional N/A N/A Debridement: Pre-procedure Verification/Time Out 16:02 N/A N/A Taken: Lidocaine 4% Topical Solution  N/A N/A Pain Control: Callus, Subcutaneous, Slough N/A N/A Tissue Debrided: Skin/Subcutaneous Tissue N/A N/A Level: 0.43 N/A N/A Debridement A (sq cm): rea Curette N/A N/A Instrument: Minimum N/A N/A Bleeding: Pressure N/A N/A Hemostasis A chieved: Procedure was tolerated well N/A N/A Debridement Treatment Response: 1.1x0.5x0.7 N/A N/A Post Debridement Measurements [name] x W x D (cm) 0.302 N/A N/A Post Debridement Volume: (cm) Callus: Yes N/A N/A Periwound Skin Texture: Excoriation: No Induration: No Crepitus: No Rash: No Scarring: No Maceration: Yes N/A N/A Periwound Skin Moisture: Dry/Scaly: No Atrophie Blanche: No N/A N/A Periwound Skin Color: Cyanosis: No Ecchymosis: No Erythema: No Hemosiderin Staining: No Mottled: No Pallor: No Rubor: No No Abnormality N/A N/A Temperature: Yes N/A N/A Tenderness on Palpation: Debridement N/A N/A Procedures Performed: T Contact Cast otal Treatment Notes Electronic Signature(s) Signed: 01/13/2023 4:37:48 PM By: Duanne Guess MD [name] [name], [name] Signed: ([id number]) [id number] Page 4 of 8 Entered By: Duanne Guess on 01/13/2023 13:11:51 -------------------------------------------------------------------------------- Multi-Disciplinary Care Plan Details Patient Name: Date of Service: [name], [place] [name]. 01/13/2023 3:00 PM Medical Record Number: [id number] Patient Account Number: [id number] Date of Birth/Sex: Treating RN: 1980-02-27 (42 y.o. Katrinka Blazing Primary Care Elverta Dimiceli: Gwinda Passe Other Clinician: Referring Denzal Meir: Treating Harlow Basley/Extender: Sharyl Nimrod in Treatment: 22 Active Inactive Nutrition Nursing Diagnoses: Impaired glucose control: actual or potential Goals: Patient/caregiver agrees to and verbalizes understanding of need to obtain nutritional consultation Date Initiated:  08/08/2022 Date Inactivated: 12/31/2022 Target Resolution Date: 01/13/2024 Goal Status: Met Patient/caregiver will maintain therapeutic glucose control Date Initiated: 08/08/2022 Target Resolution Date: 01/13/2024 Goal Status: Active Interventions: Assess HgA1c results as ordered upon admission and as needed Provide education on elevated blood sugars and impact on wound healing Provide education on nutrition Treatment Activities: Obtain HgA1c : 08/08/2022 Patient referred to Primary Care Physician for further nutritional evaluation : 08/08/2022 Notes: Pain, Acute or Chronic Nursing Diagnoses: Pain, acute or chronic: actual or potential Potential alteration in comfort, pain Goals: Patient will verbalize adequate pain control and receive pain control interventions during procedures as needed Date Initiated: 08/08/2022 Date Inactivated: 12/31/2022 Target Resolution Date: 01/13/2024 Goal Status: Met Patient/caregiver will verbalize comfort level met Date Initiated: 08/08/2022 Target Resolution Date: 01/13/2024 Goal Status: Active Interventions: Complete pain assessment as per visit requirements Encourage patient to take pain medications as prescribed Provide education on pain management Treatment Activities: Administer pain control measures as ordered : 08/08/2022 Notes: Wound/Skin Impairment Nursing Diagnoses: Knowledge deficit  related to ulceration/compromised skin integrity GoalsCLAIBORNE, [name] ([id number]) [id number] Page 5 of 8 Patient/caregiver will verbalize understanding of skin care regimen Date Initiated: 08/08/2022 Date Inactivated: 12/31/2022 Target Resolution Date: 01/13/2024 Goal Status: Met Ulcer/skin breakdown will heal within 14 weeks Date Initiated: 08/08/2022 Target Resolution Date: 01/13/2024 Goal Status: Active Interventions: Assess patient/caregiver ability to perform ulcer/skin care regimen upon admission and as needed Assess  ulceration(s) every visit Provide education on ulcer and skin care Treatment Activities: Skin care regimen initiated : 08/08/2022 Topical wound management initiated : 08/08/2022 Notes: Electronic Signature(s) Signed: 01/13/2023 4:40:29 PM By: Karie Schwalbe RN Entered By: Karie Schwalbe on 01/13/2023 13:29:32 -------------------------------------------------------------------------------- Pain Assessment Details Patient Name: Date of Service: [name], [name]. 01/13/2023 3:00 PM Medical Record Number: [id number] Patient Account Number: [id number] Date of Birth/Sex: Treating RN: November 05, 1980 (42 y.o. F) Primary Care Julio Storr: Gwinda Passe Other Clinician: Referring Jylian Pappalardo: Treating Tyliah Schlereth/Extender: Sharyl Nimrod in Treatment: 22 Active Problems Location of Pain Severity and Description of Pain Patient Has Paino No Site Locations Pain Management and Medication Current Pain Management: Electronic Signature(s) Signed: 01/13/2023 4:04:56 PM By: Dayton Scrape Entered By: Dayton Scrape on 01/13/2023 12:22:51 [name], [name] ([id number]) [id number] Page 6 of 8 -------------------------------------------------------------------------------- Patient/Caregiver Education Details Patient Name: Date of Service: [name], [place] [name]. 12/30/2024andnbsp3:00 PM Medical Record Number: [id number] Patient Account Number: [id number] Date of Birth/Gender: Treating RN: 1980-09-06 (42 y.o. Katrinka Blazing Primary Care Physician: Gwinda Passe Other Clinician: Referring Physician: Treating Physician/Extender: Sharyl Nimrod in Treatment: 22 Education Assessment Education Provided To: Patient Education Topics Provided Wound/Skin Impairment: Methods: Explain/Verbal Responses: State content correctly [employer]) Signed: 01/13/2023 4:40:29 PM By: Karie Schwalbe RN Entered By: Karie Schwalbe on  01/13/2023 13:29:50 -------------------------------------------------------------------------------- Wound Assessment Details Patient Name: Date of Service: [name] Seth Bake [name], [name]. 01/13/2023 3:00 PM Medical Record Number: [id number] Patient Account Number: [id number] Date of Birth/Sex: Treating RN: May 17, 1980 (42 y.o. 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 Secondary Dehisced Wound Wounding Event: Shear/Friction Etiology: Date Acquired: 07/08/2022 Wound Status: Open Weeks Of Treatment: 22 Comorbid Deep Vein Thrombosis, Hypertension, Type II Diabetes, Clustered Wound: No History: Osteomyelitis, Neuropathy Photos Wound Measurements Length: (cm) 1 Width: (cm) 0 Depth: (cm) 0 Area: (cm) [name], [name] ([id number]) Volume: (cm) .1 % Reduction in Area: -96.4% .5 % Reduction in Volume: -71.6% .7 Epithelialization: Small (1-33%) 0.432 Tunneling: No [id number] Page 7 of 8 0.302 Undermining: Yes Starting Position (o'clock): 12 Ending Position (o'clock): 6 Maximum Distance: (cm) 0.8 Wound Description Classification: Grade 3 Wound Margin: Thickened Exudate Amount: Large Exudate Type: Serosanguineous Exudate Color: red, brown Foul Odor After Cleansing: No Slough/Fibrino Yes Wound Bed Granulation Amount: Large (67-100%) Exposed Structure Granulation Quality: Red, Pink Fascia Exposed: No Necrotic Amount: Small (1-33%) Fat Layer (Subcutaneous Tissue) Exposed: Yes Tendon Exposed: No Muscle Exposed: No Joint Exposed: No Bone Exposed: No Periwound Skin Texture Texture Color No Abnormalities Noted: No No Abnormalities Noted: Yes Callus: Yes Temperature /  Pain Crepitus: No Temperature: No Abnormality Excoriation: No Tenderness on Palpation: Yes Induration: No Rash: No Scarring: No Moisture No Abnormalities Noted: No Dry / Scaly: No Maceration: Yes Treatment Notes Wound #4 (Amputation Site - Transmetatarsal) Wound Laterality: Right Cleanser Soap and Water Discharge Instruction: May shower and wash wound  with dial antibacterial soap and water prior to dressing change. On days not changing the dressing, please keep wound/dressing dry Peri-Wound Care Topical Gentamicin Discharge Instruction: As directed by physician Primary Dressing Promogran Prisma Matrix, 4.34 (sq in) (silver collagen) Discharge Instruction: Moisten collagen with saline or hydrogel Secondary Dressing ABD Pad, 8x10 Discharge Instruction: Apply over primary dressing as directed. Woven Gauze Sponge, Non-Sterile 4x4 in Discharge Instruction: Apply over primary dressing as directed. Zetuvit Plus 4x8 in Discharge Instruction: Apply over primary dressing as directed. Secured With [employer], 4.5x3.1 (in/yd) Discharge Instruction: Secure with Kerlix as directed. 93M Medipore H Soft Cloth Surgical T ape, 4 x 10 (in/yd) Discharge Instruction: Secure with tape as directed. total contact cast size 4 Discharge Instruction: last layer applied by Clarence Cogswell. Compression [name], [name] ([id number]) [id number] Page 8 of 8 Compression Stockings Add-Ons Electronic Signature(s) Signed: 01/13/2023 4:40:29 PM By: Karie Schwalbe RN Entered By: Karie Schwalbe on 01/13/2023 12:50:50 -------------------------------------------------------------------------------- Vitals Details Patient Name: Date of Service: [name], [name]. 01/13/2023 3:00 PM Medical Record Number: [id number] Patient Account Number: [id number] Date of Birth/Sex: Treating RN: 1980/08/15 (42 y.o. F) Primary Care Ichael Pullara: Gwinda Passe Other Clinician: Referring  Carrel Leather: Treating Jarin Cornfield/Extender: Sharyl Nimrod in Treatment: 22 Vital Signs Time Taken: 03:22 Temperature (F): 97.8 Height (in): 69 Pulse (bpm): 96 Weight (lbs): 330 Respiratory Rate (breaths/min): 18 Body Mass Index (BMI): 48.7 Blood Pressure (mmHg): 117/74 Reference Range: 80 - 120 mg / dl Electronic Signature(s) Signed: 01/13/2023 4:04:56 PM By: Dayton Scrape Entered By: Dayton Scrape on 01/13/2023 12:22:39

## 2023-01-13 NOTE — Progress Notes (Signed)
[name], [name] ([id number]) [id number] Page 1 of 11 Visit Report for 01/13/2023 Chief Complaint Document Details Patient Name: Date of Service: [name] [name] [name] [name] [name] [name]. 01/13/2023 3:00 PM Medical Record Number: [id number] Patient Account Number: [id number] Date of Birth/Sex: Treating RN: 13-Apr-1980 (42 y.o. F) Primary Care Provider: Gwinda Passe Other Clinician: Referring Provider: Treating Provider/Extender: Sharyl Nimrod in Treatment: 22 Information Obtained from: Patient Chief Complaint 01/22/2021; Osteomyelitis of the right foot status post transmetatarsal amputation with surgical site dehiscence 08/08/2022; reopening of wound to the right foot with a history of transmetatarsal amputation with previous difficulty healing surgical site Electronic Signature(s) Signed: 01/13/2023 4:37:48 PM By: Duanne Guess MD FACS Entered By: Duanne Guess on 01/13/2023 13:12:02 -------------------------------------------------------------------------------- Debridement Details Patient Name: Date of Service: [name] V IS, MA [name] [name]. 01/13/2023 3:00 PM Medical Record Number: [id number] Patient Account Number: [id number] Date of Birth/Sex: Treating RN: [birth year] (42 y.o. [name] [name] Primary Care Provider: Gwinda Passe Other Clinician: Referring Provider: Treating Provider/Extender: Sharyl Nimrod in Treatment: 22 Debridement Performed for Assessment: Wound #4 Right Amputation Site - Transmetatarsal Performed By: Physician Duanne Guess, MD The following information was scribed by: Karie Schwalbe The information was scribed for: Duanne Guess Debridement Type: Debridement Severity of Tissue Pre Debridement: Fat layer exposed Level of Consciousness (Pre-procedure): Awake and Alert Pre-procedure Verification/Time Out Yes - 16:02 Taken: Start Time: 16:02 Pain Control: Lidocaine 4% T opical  Solution Percent of Wound Bed Debrided: 200% T Area Debrided (cm): otal 0.86 Tissue and other material debrided: Viable, Non-Viable, Callus, Slough, Subcutaneous, Slough Level: Skin/Subcutaneous Tissue Debridement Description: Excisional Instrument: Curette Bleeding: Minimum Hemostasis Achieved: Pressure Response to Treatment: Procedure was tolerated well Level of Consciousness (Post- Awake and Alert procedure): Post Debridement Measurements of Total Wound Length: (cm) 1.1 Width: (cm) 0.5 Depth: (cm) 0.7 Volume: (cm) 0.302 Character of Wound/Ulcer Post Debridement: Improved [name] [name] [name] ([id number]) [id number] Page 2 of 11 Severity of Tissue Post Debridement: Fat layer exposed Post Procedure Diagnosis Same as Pre-procedure Electronic Signature(s) Signed: 01/13/2023 4:37:48 PM By: Duanne Guess MD FACS Signed: 01/13/2023 4:40:29 PM By: Karie Schwalbe RN Entered By: Karie Schwalbe on 01/13/2023 13:12:45 -------------------------------------------------------------------------------- HPI Details Patient Name: Date of Service: [name] V IS, MA [name] [name]. 01/13/2023 3:00 PM Medical Record Number: [id number] Patient Account Number: [id number] Date of Birth/Sex: Treating RN: [birth year] (42 y.o. F) Primary Care Provider: Gwinda Passe Other Clinician: Referring Provider: Treating Provider/Extender: Sharyl Nimrod in Treatment: 22 History of Present Illness HPI Description: Admission 01/22/2021 [name] [name] a 42 year old female with a past medical history of insulin-dependent uncontrolled type 2 diabetes with last hemoglobin A1c of 13.5, osteomyelitis of the right foot status post transmetatarsal amputation on 12/18/2020 that presents to the clinic for right foot wound. She has had dehiscence of the surgical site. [name] using wet-to-dry dressings. She has a PICC line and receiving IV ceftriaxone daily for her  osteomyelitis. There [name] an end date of 01/27/2021. [name] also taking oral metronidazole. She currently denies systemic signs of infection. 1/19; patient presents for follow-up. She was diagnosed with a DVT to the right lower extremity 2 days ago. [name] on Eliquis now. [name] scheduled to see her infectious disease doctor tomorrow. She has been using Dakin's wet-to-dry dressings. She denies systemic signs of infection. 1/26; patient presents for follow-up. She saw infectious disease on 1/21 started on Augmentin. Her PICC line and IV ceftriaxone was discontinued. Patient reports stability to her wound.  She has been using Dakin's wet-to-dry dressings. She currently denies systemic signs of infection. 2/3; patient presents for follow-up. She continues to use Dakin's wet-to-dry dressings to the wound bed. She saw Dr. Manson Passey with infectious disease yesterday and [name] continuing Augmentin. T entative end date [name] 2/16. Patient reports following up with orthopedics. She states there [name] no further plan from them. She currently denies systemic signs of infection. 2/10; patient presents for follow-up. She continues to use Dakin's wet to dry dressings. [name] scheduled to have her MRI done on 2/14. She states that she had pain to the debridement site from last clinic visit and declines debridement today. She denies systemic signs of infection. She continues to have yellow thick drainage. 2/20; patient presents for follow-up. She continues to use Dakin's wet-to-dry dressings. She obtained her MRI. The results showed an abscess and [name] scheduled to see her orthopedic surgeon on 2/23. She saw infectious disease 2/17 and her antibiotics were extended. She currently denies systemic signs of infection. 3/6; patient presents for follow-up. She had debridement and irrigation of her foot on 03/10/2021 due to abscess noted on MRI. She was started on IV ceftriaxone and oral Flagyl. She has no issues or complaints today.  She has been using iodoform packing to the tunnel and Dakin's wet-to-dry to the opening. 03/26/2021: She continues on IV ceftriaxone and oral metronidazole. She has follow-up with infectious disease tomorrow. No significant issues or complaints today. Her mother continues to help her with her wound dressing, using iodoform packing strips into the tunnel and Dakin's to the open portion of the wound. 3/20; patient presents for follow-up. She continues to be on IV ceftriaxone in oral metronidazole. She has been using iodoform to the tunnel and Dakin's wet-to- dry to the open wound. She denies signs of infection. 3/27; patient presents for follow-up. She no longer has a PICC line. She has been using iodoform to the tunnel and Dakin's wet-to-dry to the open wound. She reports improvement in wound healing. She denies signs of infection. 4/3; patient presents for follow-up. She states she has been using Hydrofera Blue to the open wound and iodoform packing to the tunnel without any issues. She denies signs of infection. 4/18; patient presents for follow-up. She saw infectious disease on 4/11. She has finished her oral antibiotics and completed a total of 6 weeks of antibiotics (this includes IV as well). No further antibiotics needed. She has been using Hydrofera Blue and iodoform packing. She states that the tunneled area has come in and the iodoform [name] not staying in place anymore. She has no issues or complaints today. She denies signs of infection. 4/24; patient presents for follow-up. She saw Dr. Carlene Coria, plastic surgery to discuss potential skin graft/substitute placement. At this time he thinks that the skin graft would likely not take. He [name] in agreement with trying a wound VAC. Patient has been using Hydrofera Blue dressing changes with no issues. She denies signs of infection. She reports improvement in wound healing. 5/1; patient presents for follow-up. Unfortunately patient did not have insurance  when we ran for the pico. There [name] an assistance program and we are trying to get this accommodated for the patient. In the meantime she has been using Hydrofera Blue without any issues. She denies signs of infection. 5/8; patient presents for follow-up. We have not heard back if pico [name] covered by her insurance. She has been using collagen to the wound bed over the past week. She denies signs of  infection. 5/18; patient presents for follow-up. She has been using collagen to the wound bed without issues. Again we have not heard if pico [name] covered by her insurance. [name] [name], [place] ([id number]) [id number] Page 3 of 11 She has no issues or complaints today. 5/23; patient presents for follow-up. She has been using collagen to the wound bed. She has no issues or complaints today. She obtained the wound VAC from [facility] and brought this in today. She denies signs of infection. 6/1; patient presents for follow-up. She has been using the wound VAC for the past week. She has had this changed twice since she was last here. She reports more maceration to the periwound. She denies signs of infection. 6/7; right TMA site. There are 2 wounds 0 separated by a bridge of healed tissue. The more lateral area has undermining. Both areas have healthy looking granulation at the base but relative the size of the wound [name] fairly deep. There [name] no exposed bone no evidence of infection. Her wound VAC was put on hold last week because of surrounding skin maceration she has been using collagen this week. She has a modified shoe 6/12; patient presents for follow-up. Last week the wound VAC was reinitiated. She had no issues with the wound VAC itself. Today she has maceration again noted to the surrounding skin. She denies signs of infection. 6/27; patient presents for follow-up. She has been using Medihoney to the wound bed. We took a break from the wound VAC because the periwound was macerated. She  still has some areas of maceration to the distal foot where there [name] a callus. She currently denies signs of infection. 7/11; patient presents for follow-up. She has been using Medihoney to the wound bed. She has developed some increased warmth and erythema to the lateral aspect of the right foot. She states this [name] occurred over the past week and there [name] increased pain. No drainage noted. 7/17; patient presents for follow-up. She has been using Medihoney to the wound bed. She completed her course of Bactrim. She reports improvement in symptoms. 7/24; Patient presents for follow up. She has been using Medihoney to the wound bed without issues. She completed another course of Bactrim. She reports improvement in her symptoms but still has some mild tenderness to the medial aspect of the foot. 7/31; Patient presents for follow-up. She has been using Medihoney and Dakin's to the wound bed. She denies signs of infection. 8/14; patient presents for follow-up. She has been using Dakin's wet-to-dry packing strips to the right medial aspect of the amputation site and Medihoney to the anterior site. She has no issues or complaints today. She has started physical therapy. She denies signs of infection. 8/29; patient presents for follow-up. She has been using Dakin's wet-to-dry packing strips to the right medial aspect of the amputation site however this [name] becoming more difficult to place. She did report that she had increased redness and swelling to that site and developed some drainage. It has resolved. She continues with physical therapy. 9/8; patient presents for follow-up. We have been using silver alginate to the tunneled area. She completed her course of antibiotics. She reports no pain, increased swelling or erythema. 9/21; patient presents for follow-up. She has been using gentamicin to the tunneled area. She has no issues or complaints today. She denies signs of infection. 10/2; patient presents for  follow-up. [name] scheduled to have her CT scan on 10/9. She currently denies signs of infection. She denies increased warmth,  erythema or purulent drainage from the wound bed. She has been using Dakin's wet-to-dry dressings. 10/12; patient presents for follow-up. She had her CT scan on 10/9 that showed A small irregular rim-enhancing fluid pocket communicating to the overlying soft tissues of the sinus tract compatible with a small abscess. Currently she denies systemic signs of infection. She has been doing Dakin's wet-to-dry packing strips but it [name] hard for her to pack into the narrow opening. 10/30; patient presents for follow-up. Since last clinic visit she has had OR debridement of her Right foot by Dr. Odis Hollingshead Due to abscess noted on CT. She has been using iodoform packing. 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 She will complete 2 weeks of this and continue with oral amoxicillin for the next 6 to 12 months. She follows with Dr. Luciana Axe for this. She currently denies signs of infection. 11/13; patient presents for follow-up. Patient has been using silver alginate with gentamicin to the wound bed. She has no issues or complaints today she. She reports improvement in wound healing. 12/4; patient presents for follow-up. She has been using silver alginate and gentamicin to the wound bed. She has no issues or complaints today. 12/8; patient presents for follow-up. She has been using silver alginate with gentamicin to the wound bed. She presents for her first cast placement. She will be back early next week for her obligatory cast change. 12/12; patient presents for follow-up. We have been using collagen with antibiotic ointment to the wound bed under a total contact cast. She has tolerated this well. [name] improved in wound healing. 12/18; patient presents for follow-up. We have been using collagen with antibiotic ointment under the total contact cast.  She has no complaints today. 1/2; The cast was placed at last clinic visit however patient missed her follow-up and this was taken off last week at a nurse visit. Patient has been using packing strips to the medial narrowed wound. She has noted no drainage. 1/22; patient presents for follow-up. She has been keeping the area covered. She reports no drainage. She has had no issues to the previous wound site. She denies any signs of infection including increased warmth, erythema or purulent drainage. 08/08/2022 Ms. [name] [name] [name] a 42 year old female with a past medical history of insulin-dependent uncontrolled type 2 diabetes with last hemoglobin A1c of 8.8, chronic osteomyelitis of the right foot status post transmetatarsal amputation on 12/18/2020 that has been previously treated for surgical dehiscence of this site in our clinic. She presents today 6 months after discharge from our clinic due to healed right foot wound with now reopening of the previous surgical site. She reports chronic pain to the site however denies purulent drainage, increased warmth or erythema. She noticed the wound opening about 1 month ago. She has been doing physical therapy without significant issues. She has inserts to her shoe however she has been advised to follow-up with [facility] for new custom inserts by her orthopedic surgeon now that she has a new wound. She states she will try and do this. She has been using antibiotic ointment to the wound bed. 8/1; patient presents for follow-up. She has been taking her antibiotics without issues. She has been using Dakin's wet-to-dry dressings to the wound bed. The wound [name] smaller. She denies systemic signs of infection. 8/13; patient presents for follow-up. She had ABIs completed that were normal. On the right it was 1.02 and on the left was 0.99. She has been  using silver alginate to the wound bed with gentamicin ointment. She states it [name] hard to pack the wound bed. She  currently denies signs of infection. 8/29; patient presents for follow-up. She has been using collagen with antibiotic ointment to the wound bed. Wound [name] stable. She denies signs of infection. 9/5; patient presents for follow-up. She has been using silver alginate to the wound bed. Wound [name] slightly wider. She denies signs of infection. 9/12; patient presents for follow-up. She has been using silver alginate to the wound bed. Wound [name] slightly smaller today. [name], [name] ([id number]) [id number] Page 4 of 11 9/19; right foot in the setting of a TMA over the first metatarsal head. She has thick callus around this area small fissured wound which actually looks like it [name] attempting to close. She has a offloading shoe I am not sure how much activity [name] in. I did not have a lot of time to see her today because of transportation 9/26; right foot in the setting of a previous TMA. The wound [name] over the plantar first metatarsal head. This [name] a fissured wound with thick callus around the margins. It [name] small in terms of overall surface area but has a depth of about 0.6 cm there was no palpable bone. She has a history of chronic osteomyelitis although she has been treated for this I have reviewed CT scans from 2023 also an MRI from 10/26/2021. Neither imaging study showed osteomyelitis in this area although the CT scan suggested osteomyelitis in the fourth metatarsal head. There has been problems with abscesses communicating with the wound although there does not appear to be purulent drainage currently 10/3; right foot at the TMA site. Small probing wound. I changed her to Iodosorb with gauze last time. May be some improvement. She has a depth of about 0.6 cm but there [name] no palpable bone. She has been previously treated for chronic osteomyelitis in this area. 10/17; this [name] a patient with a probing wound at the right TMA site. Underlying osteomyelitis previously treated  earlier this year. I been using Iodosorb to try and help constrict this wound however today she comes in with a larger circumference a larger tunnel and this probably sits right on top of bone. Also worrisome [name] there was a streaking dark area just caudal to the wound on the dorsal surface of the foot. The patient [name] complaining of more pain but no fever. 10/24; culture of this area [bone scraping] revealed Acinetobacter and 2 coag negative staph. I am going to give her Septra DS 1 p.o. twice daily for 2 weeks. I think it would be simplistic to call this a bone culture but it certainly was a deep culture. An x-ray was done but that has not been reported yet. I had wanted to get lab work including a CBC with differential ESR and C-reactive protein but I am not actually sure that got done last week. We have been using gent and silver alginate. 11/7; patient [name] still taking trimethoprim/sulfamethoxazole for the assessment of bacterial identified last week. Lab work revealed a C-reactive protein of 4.1 which [name] elevated from 1.7 but not dramatic. White count was normal at 8.4 x-ray of the right foot revealed minimal new lucency and cortical erosion within the adjacent distal plantar aspect of the metatarsals concerning for acute osteomyelitis early. 11/19; the patient has completed her Septra DS which I gave to her for 2 weeks. She has her MRI tomorrow but then we will have  the obligatory 5 to 10- day/workday wait to get a report. I am going to resume her Septra DS for a further 2 weeks. Then decide on additional antibiotics, infectious disease etc. 12/5; the patient has completed the Septra DS for the originally cultured as Enterobacter on a bone scraping. Her MRI somewhat surprisingly showed status post amputation of the 1st through 5th metatarsal base but no evidence of osteomyelitis. Surrounding skin thickening with mild edema could represent cellulitis. Previous x-ray showed the suggestion of bone  erosion although this was not verified by her MRI. 12/9; patient comes in today for follow-up. She tolerated the total contact cast well. The areas on the right TMA laterally. Medially she has undermining. What I can see of the granulation looks healthy. We are using Prisma and we placed the total contact cast again last week 12/17;. She has a area on the right TMA medially. This seems to have contracted quite a bit this week. There [name] still 1 cm of depth. No palpable bone. No purulent drainage. Again MRI did not suggest underlying osteomyelitis. She has completed the antibiotics I gave her for S Enterobacter. We put her back in a total contact cast last week using Prisma as the primary dressing 12/23; right TMA medially. This [name] a weightbearing surface we now have her in a total contact cast. We have been using Prisma and gentamicin. The gentamicin to target S adeno bacteria. [name] completed a course of Septra. MRI did not underlying osteomyelitis. 01/13/2023: The length and depth of the wound, including undermining, has decreased a bit since last week. She has some slough accumulation on the surface as well as extensive callus buildup. Electronic Signature(s) Signed: 01/13/2023 4:37:48 PM By: Duanne Guess MD FACS Entered By: Duanne Guess on 01/13/2023 13:13:11 -------------------------------------------------------------------------------- Physical Exam Details Patient Name: Date of Service: [name] V IS, MA [name] [name]. 01/13/2023 3:00 PM Medical Record Number: [id number] Patient Account Number: [id number] Date of Birth/Sex: Treating RN: 10-07-1980 (42 y.o. F) Primary Care Provider: Gwinda Passe Other Clinician: Referring Provider: Treating Provider/Extender: Sharyl Nimrod in Treatment: 22 Constitutional . . . . no acute distress. Respiratory Normal work of breathing on room air.. Notes 01/13/2023: The length and depth of the wound, including undermining,  has decreased a bit since last week. She has some slough accumulation on the surface as well as extensive callus buildup. Electronic Signature(s) Signed: 01/13/2023 4:37:48 PM By: Duanne Guess MD FACS [place], [facility] L12/30/2024 4:37:48 PM By: Duanne Guess MD FACS Signed: ([id number]) [id number] Page 5 of 11 Entered By: Duanne Guess on 01/13/2023 13:15:28 -------------------------------------------------------------------------------- Physician Orders Details Patient Name: Date of Service: [name] V IS, MA [name] [name]. 01/13/2023 3:00 PM Medical Record Number: [id number] Patient Account Number: [id number] Date of Birth/Sex: Treating RN: 01-30-1980 (42 y.o. [name] [name] Primary Care Provider: Gwinda Passe Other Clinician: Referring Provider: Treating Provider/Extender: Sharyl Nimrod in Treatment: 22 Verbal / Phone Orders: No Diagnosis Coding ICD-10 Coding Code Description [phone] Non-pressure chronic ulcer of other part of right foot with fat layer exposed E11.621 Type 2 diabetes mellitus with foot ulcer M86.671 Other chronic osteomyelitis, right ankle and foot Follow-up Appointments ppointment in 1 week. - Dr. Mikey Bussing 01/20/23 at 1:15PM (already scheduled) Return A ppointment in 2 weeks. - ***Dr. Leonidas Romberg week of January 2025 (front office to schedule) Return A Return appointment in 3 weeks. - ***Dr. Hoffman****January 2025 (front office to schedule) Return appointment in 1 month. - ***Dr. Hoffman****January 2025 (front office to schedule) Anesthetic (  In clinic) Topical Lidocaine 5% applied to wound bed Bathing/ Shower/ Hygiene May shower with protection but do not get wound dressing(s) wet. Protect dressing(s) with water repellant cover (for example, large plastic bag) or a cast cover and may then take shower. Other Bathing/Shower/Hygiene Orders/Instructions: - When changing the dressing (every other day) you  may get right foot wet. On days NOT changing the dressing.Keep wound/dressing dry Off-Loading Total Contact Cast to Right Lower Extremity - size 4 Wound Treatment Wound #4 - Amputation Site - Transmetatarsal Wound Laterality: Right Cleanser: Soap and Water 1 x Per Week/30 Days Discharge Instructions: May shower and wash wound with dial antibacterial soap and water prior to dressing change. On days not changing the dressing, please keep wound/dressing dry Topical: Gentamicin 1 x Per Week/30 Days Discharge Instructions: As directed by physician Prim Dressing: Promogran Prisma Matrix, 4.34 (sq in) (silver collagen) 1 x Per Week/30 Days ary Discharge Instructions: Moisten collagen with saline or hydrogel Secondary Dressing: ABD Pad, 8x10 1 x Per Week/30 Days Discharge Instructions: Apply over primary dressing as directed. Secondary Dressing: Woven Gauze Sponge, Non-Sterile 4x4 in 1 x Per Week/30 Days Discharge Instructions: Apply over primary dressing as directed. Secondary Dressing: Zetuvit Plus 4x8 in 1 x Per Week/30 Days Discharge Instructions: Apply over primary dressing as directed. Secured With: [employer], 4.5x3.1 (in/yd) 1 x Per Week/30 Days Discharge Instructions: Secure with Kerlix as directed. Secured With: 58M Medipore H Soft Cloth Surgical T ape, 4 x 10 (in/yd) 1 x Per Week/30 Days Discharge Instructions: Secure with tape as directed. Secured With: total contact cast size 4 1 x Per Week/30 Days Discharge Instructions: last layer applied by provider. [name], [name] ([id number]) [id number] Page 6 of 11 Electronic Signature(s) Signed: 01/13/2023 4:37:48 PM By: Duanne Guess MD FACS Signed: 01/13/2023 4:40:29 PM By: Karie Schwalbe RN Entered By: Karie Schwalbe on 01/13/2023 13:29:00 -------------------------------------------------------------------------------- Problem List Details Patient Name: Date of Service: [name] Seth Bake IS, MA [name] [name].  01/13/2023 3:00 PM Medical Record Number: [id number] Patient Account Number: [id number] Date of Birth/Sex: Treating RN: 1980-12-12 (42 y.o. F) Primary Care Provider: Gwinda Passe Other Clinician: Referring Provider: Treating Provider/Extender: Sharyl Nimrod in Treatment: 22 Active Problems ICD-10 Encounter Code Description Active Date MDM Diagnosis L97.512 Non-pressure chronic ulcer of other part of right foot with fat layer exposed 08/08/2022 No Yes E11.621 Type 2 diabetes mellitus with foot ulcer 08/08/2022 No Yes M86.671 Other chronic osteomyelitis, right ankle and foot 08/08/2022 No Yes Inactive Problems Resolved Problems Electronic Signature(s) Signed: 01/13/2023 4:37:48 PM By: Duanne Guess MD FACS Entered By: Duanne Guess on 01/13/2023 13:11:32 -------------------------------------------------------------------------------- Progress Note Details Patient Name: Date of Service: [name] V IS, MA [name] [name]. 01/13/2023 3:00 PM Medical Record Number: [id number] Patient Account Number: [id number] Date of Birth/Sex: Treating RN: 06/17/1980 (42 y.o. F) Primary Care Provider: Gwinda Passe Other Clinician: Referring Provider: Treating Provider/Extender: Sharyl Nimrod in Treatment: 22 Subjective Chief Complaint Information obtained from Patient 01/22/2021; Osteomyelitis of the right foot status post transmetatarsal amputation with surgical site dehiscence 08/08/2022; reopening of wound to the right foot with a history of transmetatarsal amputation with previous difficulty healing surgical site [name], KAMINSKI [name] ([id number]) [id number] Page 7 of 11 History of Present Illness (HPI) Admission 01/22/2021 Ms. Chan Raczka [name] a 42 year old female with a past medical history of insulin-dependent uncontrolled type 2 diabetes with last hemoglobin A1c of 13.5, osteomyelitis of the right foot status post  transmetatarsal amputation on 12/18/2020 that presents to the clinic  for right foot wound. She has had dehiscence of the surgical site. [name] using wet-to-dry dressings. She has a PICC line and receiving IV ceftriaxone daily for her osteomyelitis. There [name] an end date of 01/27/2021. [name] also taking oral metronidazole. She currently denies systemic signs of infection. 1/19; patient presents for follow-up. She was diagnosed with a DVT to the right lower extremity 2 days ago. [name] on Eliquis now. [name] scheduled to see her infectious disease doctor tomorrow. She has been using Dakin's wet-to-dry dressings. She denies systemic signs of infection. 1/26; patient presents for follow-up. She saw infectious disease on 1/21 started on Augmentin. Her PICC line and IV ceftriaxone was discontinued. Patient reports stability to her wound. She has been using Dakin's wet-to-dry dressings. She currently denies systemic signs of infection. 2/3; patient presents for follow-up. She continues to use Dakin's wet-to-dry dressings to the wound bed. She saw Dr. Manson Passey with infectious disease yesterday and [name] continuing Augmentin. T entative end date [name] 2/16. Patient reports following up with orthopedics. She states there [name] no further plan from them. She currently denies systemic signs of infection. 2/10; patient presents for follow-up. She continues to use Dakin's wet to dry dressings. [name] scheduled to have her MRI done on 2/14. She states that she had pain to the debridement site from last clinic visit and declines debridement today. She denies systemic signs of infection. She continues to have yellow thick drainage. 2/20; patient presents for follow-up. She continues to use Dakin's wet-to-dry dressings. She obtained her MRI. The results showed an abscess and [name] scheduled to see her orthopedic surgeon on 2/23. She saw infectious disease 2/17 and her antibiotics were extended. She currently denies  systemic signs of infection. 3/6; patient presents for follow-up. She had debridement and irrigation of her foot on 03/10/2021 due to abscess noted on MRI. She was started on IV ceftriaxone and oral Flagyl. She has no issues or complaints today. She has been using iodoform packing to the tunnel and Dakin's wet-to-dry to the opening. 03/26/2021: She continues on IV ceftriaxone and oral metronidazole. She has follow-up with infectious disease tomorrow. No significant issues or complaints today. Her mother continues to help her with her wound dressing, using iodoform packing strips into the tunnel and Dakin's to the open portion of the wound. 3/20; patient presents for follow-up. She continues to be on IV ceftriaxone in oral metronidazole. She has been using iodoform to the tunnel and Dakin's wet-to- dry to the open wound. She denies signs of infection. 3/27; patient presents for follow-up. She no longer has a PICC line. She has been using iodoform to the tunnel and Dakin's wet-to-dry to the open wound. She reports improvement in wound healing. She denies signs of infection. 4/3; patient presents for follow-up. She states she has been using Hydrofera Blue to the open wound and iodoform packing to the tunnel without any issues. She denies signs of infection. 4/18; patient presents for follow-up. She saw infectious disease on 4/11. She has finished her oral antibiotics and completed a total of 6 weeks of antibiotics (this includes IV as well). No further antibiotics needed. She has been using Hydrofera Blue and iodoform packing. She states that the tunneled area has come in and the iodoform [name] not staying in place anymore. She has no issues or complaints today. She denies signs of infection. 4/24; patient presents for follow-up. She saw Dr. Carlene Coria, plastic surgery to discuss potential skin graft/substitute placement. At this time  he thinks that the skin graft would likely not take. He [name] in agreement with  trying a wound VAC. Patient has been using Hydrofera Blue dressing changes with no issues. She denies signs of infection. She reports improvement in wound healing. 5/1; patient presents for follow-up. Unfortunately patient did not have insurance when we ran for the pico. There [name] an assistance program and we are trying to get this accommodated for the patient. In the meantime she has been using Hydrofera Blue without any issues. She denies signs of infection. 5/8; patient presents for follow-up. We have not heard back if pico [name] covered by her insurance. She has been using collagen to the wound bed over the past week. She denies signs of infection. 5/18; patient presents for follow-up. She has been using collagen to the wound bed without issues. Again we have not heard if pico [name] covered by her insurance. She has no issues or complaints today. 5/23; patient presents for follow-up. She has been using collagen to the wound bed. She has no issues or complaints today. She obtained the wound VAC from [facility] and brought this in today. She denies signs of infection. 6/1; patient presents for follow-up. She has been using the wound VAC for the past week. She has had this changed twice since she was last here. She reports more maceration to the periwound. She denies signs of infection. 6/7; right TMA site. There are 2 wounds 0 separated by a bridge of healed tissue. The more lateral area has undermining. Both areas have healthy looking granulation at the base but relative the size of the wound [name] fairly deep. There [name] no exposed bone no evidence of infection. Her wound VAC was put on hold last week because of surrounding skin maceration she has been using collagen this week. She has a modified shoe 6/12; patient presents for follow-up. Last week the wound VAC was reinitiated. She had no issues with the wound VAC itself. Today she has maceration again noted to the surrounding skin. She denies signs of  infection. 6/27; patient presents for follow-up. She has been using Medihoney to the wound bed. We took a break from the wound VAC because the periwound was macerated. She still has some areas of maceration to the distal foot where there [name] a callus. She currently denies signs of infection. 7/11; patient presents for follow-up. She has been using Medihoney to the wound bed. She has developed some increased warmth and erythema to the lateral aspect of the right foot. She states this [name] occurred over the past week and there [name] increased pain. No drainage noted. 7/17; patient presents for follow-up. She has been using Medihoney to the wound bed. She completed her course of Bactrim. She reports improvement in symptoms. 7/24; Patient presents for follow up. She has been using Medihoney to the wound bed without issues. She completed another course of Bactrim. She reports improvement in her symptoms but still has some mild tenderness to the medial aspect of the foot. 7/31; Patient presents for follow-up. She has been using Medihoney and Dakin's to the wound bed. She denies signs of infection. 8/14; patient presents for follow-up. She has been using Dakin's wet-to-dry packing strips to the right medial aspect of the amputation site and Medihoney to the anterior site. She has no issues or complaints today. She has started physical therapy. She denies signs of infection. 8/29; patient presents for follow-up. She has been using Dakin's wet-to-dry packing strips to the right medial aspect of  the amputation site however this [name] becoming more difficult to place. She did report that she had increased redness and swelling to that site and developed some drainage. It has resolved. She continues with physical therapy. LADORA, [name] ([id number]) [id number] Page 8 of 11 9/8; patient presents for follow-up. We have been using silver alginate to the tunneled area. She completed her course of  antibiotics. She reports no pain, increased swelling or erythema. 9/21; patient presents for follow-up. She has been using gentamicin to the tunneled area. She has no issues or complaints today. She denies signs of infection. 10/2; patient presents for follow-up. [name] scheduled to have her CT scan on 10/9. She currently denies signs of infection. She denies increased warmth, erythema or purulent drainage from the wound bed. She has been using Dakin's wet-to-dry dressings. 10/12; patient presents for follow-up. She had her CT scan on 10/9 that showed A small irregular rim-enhancing fluid pocket communicating to the overlying soft tissues of the sinus tract compatible with a small abscess. Currently she denies systemic signs of infection. She has been doing Dakin's wet-to-dry packing strips but it [name] hard for her to pack into the narrow opening. 10/30; patient presents for follow-up. Since last clinic visit she has had OR debridement of her Right foot by Dr. Odis Hollingshead Due to abscess noted on CT. She has been using iodoform packing. 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 She will complete 2 weeks of this and continue with oral amoxicillin for the next 6 to 12 months. She follows with Dr. Luciana Axe for this. She currently denies signs of infection. 11/13; patient presents for follow-up. Patient has been using silver alginate with gentamicin to the wound bed. She has no issues or complaints today she. She reports improvement in wound healing. 12/4; patient presents for follow-up. She has been using silver alginate and gentamicin to the wound bed. She has no issues or complaints today. 12/8; patient presents for follow-up. She has been using silver alginate with gentamicin to the wound bed. She presents for her first cast placement. She will be back early next week for her obligatory cast change. 12/12; patient presents for follow-up. We have been using collagen  with antibiotic ointment to the wound bed under a total contact cast. She has tolerated this well. [name] improved in wound healing. 12/18; patient presents for follow-up. We have been using collagen with antibiotic ointment under the total contact cast. She has no complaints today. 1/2; The cast was placed at last clinic visit however patient missed her follow-up and this was taken off last week at a nurse visit. Patient has been using packing strips to the medial narrowed wound. She has noted no drainage. 1/22; patient presents for follow-up. She has been keeping the area covered. She reports no drainage. She has had no issues to the previous wound site. She denies any signs of infection including increased warmth, erythema or purulent drainage. 08/08/2022 Ms. Mehjabeen Torris [name] a 42 year old female with a past medical history of insulin-dependent uncontrolled type 2 diabetes with last hemoglobin A1c of 8.8, chronic osteomyelitis of the right foot status post transmetatarsal amputation on 12/18/2020 that has been previously treated for surgical dehiscence of this site in our clinic. She presents today 6 months after discharge from our clinic due to healed right foot wound with now reopening of the previous surgical site. She reports chronic pain to the site however denies purulent drainage, increased warmth or erythema. She  noticed the wound opening about 1 month ago. She has been doing physical therapy without significant issues. She has inserts to her shoe however she has been advised to follow-up with [facility] for new custom inserts by her orthopedic surgeon now that she has a new wound. She states she will try and do this. She has been using antibiotic ointment to the wound bed. 8/1; patient presents for follow-up. She has been taking her antibiotics without issues. She has been using Dakin's wet-to-dry dressings to the wound bed. The wound [name] smaller. She denies systemic signs of infection. 8/13;  patient presents for follow-up. She had ABIs completed that were normal. On the right it was 1.02 and on the left was 0.99. She has been using silver alginate to the wound bed with gentamicin ointment. She states it [name] hard to pack the wound bed. She currently denies signs of infection. 8/29; patient presents for follow-up. She has been using collagen with antibiotic ointment to the wound bed. Wound [name] stable. She denies signs of infection. 9/5; patient presents for follow-up. She has been using silver alginate to the wound bed. Wound [name] slightly wider. She denies signs of infection. 9/12; patient presents for follow-up. She has been using silver alginate to the wound bed. Wound [name] slightly smaller today. 9/19; right foot in the setting of a TMA over the first metatarsal head. She has thick callus around this area small fissured wound which actually looks like it [name] attempting to close. She has a offloading shoe I am not sure how much activity [name] in. I did not have a lot of time to see her today because of transportation 9/26; right foot in the setting of a previous TMA. The wound [name] over the plantar first metatarsal head. This [name] a fissured wound with thick callus around the margins. It [name] small in terms of overall surface area but has a depth of about 0.6 cm there was no palpable bone. She has a history of chronic osteomyelitis although she has been treated for this I have reviewed CT scans from 2023 also an MRI from 10/26/2021. Neither imaging study showed osteomyelitis in this area although the CT scan suggested osteomyelitis in the fourth metatarsal head. There has been problems with abscesses communicating with the wound although there does not appear to be purulent drainage currently 10/3; right foot at the TMA site. Small probing wound. I changed her to Iodosorb with gauze last time. May be some improvement. She has a depth of about 0.6 cm but there [name] no palpable bone. She has been  previously treated for chronic osteomyelitis in this area. 10/17; this [name] a patient with a probing wound at the right TMA site. Underlying osteomyelitis previously treated earlier this year. I been using Iodosorb to try and help constrict this wound however today she comes in with a larger circumference a larger tunnel and this probably sits right on top of bone. Also worrisome [name] there was a streaking dark area just caudal to the wound on the dorsal surface of the foot. The patient [name] complaining of more pain but no fever. 10/24; culture of this area [bone scraping] revealed Acinetobacter and 2 coag negative staph. I am going to give her Septra DS 1 p.o. twice daily for 2 weeks. I think it would be simplistic to call this a bone culture but it certainly was a deep culture. An x-ray was done but that has not been reported yet. I had wanted to get lab  work including a CBC with differential ESR and C-reactive protein but I am not actually sure that got done last week. We have been using gent and silver alginate. 11/7; patient [name] still taking trimethoprim/sulfamethoxazole for the assessment of bacterial identified last week. Lab work revealed a C-reactive protein of 4.1 which [name] elevated from 1.7 but not dramatic. White count was normal at 8.4 x-ray of the right foot revealed minimal new lucency and cortical erosion within the adjacent distal plantar aspect of the metatarsals concerning for acute osteomyelitis early. 11/19; the patient has completed her Septra DS which I gave to her for 2 weeks. She has her MRI tomorrow but then we will have the obligatory 5 to 10- day/workday wait to get a report. I am going to resume her Septra DS for a further 2 weeks. Then decide on additional antibiotics, infectious disease etc. 12/5; the patient has completed the Septra DS for the originally cultured as Enterobacter on a bone scraping. Her MRI somewhat surprisingly showed status post amputation of the 1st through  5th metatarsal base but no evidence of osteomyelitis. Surrounding skin thickening with mild edema could represent cellulitis. Previous x-ray showed the suggestion of bone erosion although this was not verified by her MRI. [name], [name] ([id number]) [id number] Page 9 of 11 12/9; patient comes in today for follow-up. She tolerated the total contact cast well. The areas on the right TMA laterally. Medially she has undermining. What I can see of the granulation looks healthy. We are using Prisma and we placed the total contact cast again last week 12/17;. She has a area on the right TMA medially. This seems to have contracted quite a bit this week. There [name] still 1 cm of depth. No palpable bone. No purulent drainage. Again MRI did not suggest underlying osteomyelitis. She has completed the antibiotics I gave her for S Enterobacter. We put her back in a total contact cast last week using Prisma as the primary dressing 12/23; right TMA medially. This [name] a weightbearing surface we now have her in a total contact cast. We have been using Prisma and gentamicin. The gentamicin to target S adeno bacteria. [name] completed a course of Septra. MRI did not underlying osteomyelitis. 01/13/2023: The length and depth of the wound, including undermining, has decreased a bit since last week. She has some slough accumulation on the surface as well as extensive callus buildup. Objective Constitutional no acute distress. Vitals Time Taken: 3:22 AM, Height: 69 in, Weight: 330 lbs, BMI: 48.7, Temperature: 97.8 F, Pulse: 96 bpm, Respiratory Rate: 18 breaths/min, Blood Pressure: 117/74 mmHg. Respiratory Normal work of breathing on room air.. General Notes: 01/13/2023: The length and depth of the wound, including undermining, has decreased a bit since last week. She has some slough accumulation on the surface as well as extensive callus buildup. Integumentary (Hair, Skin) Wound #4 status [name]  Open. Original cause of wound was Shear/Friction. The date acquired was: 07/08/2022. The wound has been in treatment 22 weeks. The wound [name] located on the Right Amputation Site - Transmetatarsal. The wound measures 1.1cm length x 0.5cm width x 0.7cm depth; 0.432cm^2 area and 0.302cm^3 volume. There [name] Fat Layer (Subcutaneous Tissue) exposed. There [name] no tunneling noted, however, there [name] undermining starting at 12:00 and ending at 6:00 with a maximum distance of 0.8cm. There [name] a large amount of serosanguineous drainage noted. The wound margin [name] thickened. There [name] large (67- 100%) red, pink granulation within the wound bed. There [name] a small (  1-33%) amount of necrotic tissue within the wound bed. The periwound skin appearance had no abnormalities noted for color. The periwound skin appearance exhibited: Callus, Maceration. The periwound skin appearance did not exhibit: Crepitus, Excoriation, Induration, Rash, Scarring, Dry/Scaly. Periwound temperature was noted as No Abnormality. The periwound has tenderness on palpation. Assessment Active Problems ICD-10 Non-pressure chronic ulcer of other part of right foot with fat layer exposed Type 2 diabetes mellitus with foot ulcer Other chronic osteomyelitis, right ankle and foot Procedures Wound #4 Pre-procedure diagnosis of Wound #4 [name] a Diabetic Wound/Ulcer of the Lower Extremity located on the Right Amputation Site - Transmetatarsal .Severity of Tissue Pre Debridement [name]: Fat layer exposed. There was a Excisional Skin/Subcutaneous Tissue Debridement with a total area of 0.86 sq cm performed by Duanne Guess, MD. With the following instrument(s): Curette to remove Viable and Non-Viable tissue/material. Material removed includes Callus, Subcutaneous Tissue, and Slough after achieving pain control using Lidocaine 4% T opical Solution. No specimens were taken. A time out was conducted at 16:02, prior to the start of the procedure. A Minimum amount of  bleeding was controlled with Pressure. The procedure was tolerated well. Post Debridement Measurements: 1.1cm length x 0.5cm width x 0.7cm depth; 0.302cm^3 volume. Character of Wound/Ulcer Post Debridement [name] improved. Severity of Tissue Post Debridement [name]: Fat layer exposed. 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 There was a T Contact Cast Procedure by Duanne Guess, MD. otal Post procedure Diagnosis Wound #4: Same as Pre-Procedure Plan [name], [name] [name] ([id number]) [id number] Page 10 of 11 Follow-up Appointments: Return Appointment in 1 week. - Dr. Leanord Hawking (already scheduled) Return Appointment in 2 weeks. - ***Dr. Leonidas Romberg week of January 2025 (front office to schedule) Return appointment in 3 weeks. - ***Dr. Hoffman****January 2025 (front office to schedule) Return appointment in 1 month. - ***Dr. Hoffman****January 2025 (front office to schedule) Anesthetic: (In clinic) Topical Lidocaine 5% applied to wound bed Bathing/ Shower/ Hygiene: May shower with protection but do not get wound dressing(s) wet. Protect dressing(s) with water repellant cover (for example, large plastic bag) or a cast cover and may then take shower. Other Bathing/Shower/Hygiene Orders/Instructions: - When changing the dressing (every other day) you may get right foot wet. On days NOT changing the dressing.Keep wound/dressing dry Off-Loading: T Contact Cast to Right Lower Extremity - size 4 otal WOUND #4: - Amputation Site - Transmetatarsal Wound Laterality: Right Cleanser: Soap and Water 1 x Per Week/30 Days Discharge Instructions: May shower and wash wound with dial antibacterial soap and water prior to dressing change. On days not changing the dressing, please keep wound/dressing dry Topical: Gentamicin 1 x Per  Week/30 Days Discharge Instructions: As directed by physician Prim Dressing: Promogran Prisma Matrix, 4.34 (sq in) (silver collagen) 1 x Per Week/30 Days ary Discharge Instructions: Moisten collagen with saline or hydrogel Secondary Dressing: ABD Pad, 8x10 1 x Per Week/30 Days Discharge Instructions: Apply over primary dressing as directed. Secondary Dressing: Woven Gauze Sponge, Non-Sterile 4x4 in 1 x Per Week/30 Days Discharge Instructions: Apply over primary dressing as directed. Secondary Dressing: Zetuvit Plus 4x8 in 1 x Per Week/30 Days Discharge Instructions: Apply over primary dressing as directed. Secured With: [employer], 4.5x3.1 (in/yd) 1 x Per Week/30 Days Discharge Instructions: Secure with Kerlix as directed. Secured With: 25M Medipore H Soft Cloth Surgical T ape, 4 x 10 (in/yd) 1 x Per  Week/30 Days Discharge Instructions: Secure with tape as directed. Secured With: total contact cast size 4 1 x Per Week/30 Days Discharge Instructions: last layer applied by provider. 01/13/2023: The length and depth of the wound, including undermining, has decreased a bit since last week. She has some slough accumulation on the surface as well as extensive callus buildup. I used a curette to debride callus, slough, and subcutaneous tissue from the wound. We will continue to use Prisma silver collagen over topical gentamicin for treatment of bacteria cultured from her wound previously. Continue total contact casting. Follow-up in 1 week. Electronic Signature(s) Signed: 01/13/2023 4:37:48 PM By: Duanne Guess MD FACS Entered By: Duanne Guess on 01/13/2023 13:18:43 -------------------------------------------------------------------------------- Total Contact Cast Details Patient Name: Date of Service: [name] Seth Bake IS, MA [name] [name]. 01/13/2023 3:00 PM Medical Record Number: [id number] Patient Account Number: [id number] Date of Birth/Sex: Treating RN: [birth year] (42 y.o. [name] [name] Primary Care Provider: Gwinda Passe Other Clinician: Referring Provider: Treating Provider/Extender: Sharyl Nimrod in Treatment: 22 T Contact Cast Applied for Wound Assessment: otal Wound #4 Right Amputation Site - Transmetatarsal Performed By: Physician Duanne Guess, MD The following information was scribed by: Karie Schwalbe The information was scribed for: Duanne Guess Post Procedure Diagnosis Same as Pre-procedure Electronic Signature(s) Signed: 01/13/2023 4:37:48 PM By: Duanne Guess MD FACS Signed: 01/13/2023 4:40:29 PM By: Karie Schwalbe RN Entered By: Karie Schwalbe on 01/13/2023 13:02:56 [name] [name] [employer] ([id number]) [id number] Page 11 of 11 -------------------------------------------------------------------------------- SuperBill Details Patient Name: Date of Service: [name] [name] [name] [name] [name]. 01/13/2023 Medical Record Number: [id number] Patient Account Number: [id number] Date of Birth/Sex: Treating RN: November 09, 1980 (42 y.o. 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, right ankle and foot Facility Procedures : CPT4 Code: [id number] Description: 11042 - DEB SUBQ TISSUE 20 SQ CM/< ICD-10 Diagnosis Description L97.512 Non-pressure chronic ulcer of other part of right foot with fat layer exposed Modifier: Quantity: 1 Physician Procedures : CPT4 Code Description Modifier [id number] 99214 - WC PHYS LEVEL 4 - EST PT ICD-10 Diagnosis Description L97.512 Non-pressure chronic ulcer of other part of right foot with fat layer exposed E11.621 Type 2 diabetes mellitus with foot  ulcer M86.671 Other  chronic osteomyelitis, right ankle and foot Quantity: 1 : [id number] 11042 - WC PHYS SUBQ TISS 20 SQ CM ICD-10 Diagnosis Description L97.512 Non-pressure chronic ulcer of other part of right foot with fat layer exposed Quantity: 1 Electronic Signature(s) Signed: 01/13/2023 4:37:48 PM By: Duanne Guess MD FACS Entered By: Duanne Guess on 01/13/2023 13:19:22

## 2023-01-20 ENCOUNTER — Encounter (HOSPITAL_BASED_OUTPATIENT_CLINIC_OR_DEPARTMENT_OTHER): Payer: Medicaid Other | Attending: Internal Medicine | Admitting: Internal Medicine

## 2023-01-20 DIAGNOSIS — M86671 Other chronic osteomyelitis, right ankle and foot: Secondary | ICD-10-CM | POA: Insufficient documentation

## 2023-01-20 DIAGNOSIS — L97512 Non-pressure chronic ulcer of other part of right foot with fat layer exposed: Secondary | ICD-10-CM | POA: Diagnosis not present

## 2023-01-20 DIAGNOSIS — Z89431 Acquired absence of right foot: Secondary | ICD-10-CM | POA: Diagnosis not present

## 2023-01-20 DIAGNOSIS — E1169 Type 2 diabetes mellitus with other specified complication: Secondary | ICD-10-CM | POA: Insufficient documentation

## 2023-01-20 DIAGNOSIS — E11621 Type 2 diabetes mellitus with foot ulcer: Secondary | ICD-10-CM | POA: Insufficient documentation

## 2023-01-23 ENCOUNTER — Other Ambulatory Visit: Payer: Self-pay

## 2023-01-23 NOTE — Progress Notes (Signed)
 [name], [name] ([id number]) [id number] Page 1 of 11 Visit Report for 01/20/2023 Chief Complaint Document Details Patient Name: Date of Service: [name], [place] [name] 01/20/2023 1:15 PM Medical Record Number: [id number] Patient Account Number: [id number] Date of Birth/Sex: Treating RN: [birth year] (43 y.o. F) Primary Care Provider: Celestia Browning Other Clinician: Referring Provider: Treating Provider/Extender: Rosan Harlene Celestia Browning Devra in Treatment: 23 Information Obtained from: Patient Chief Complaint 01/22/2021; Osteomyelitis of the right foot status post transmetatarsal amputation with surgical site dehiscence 08/08/2022; reopening of wound to the right foot with a history of transmetatarsal amputation with previous difficulty healing surgical site Electronic Signature(s) Signed: 01/22/2023 5:26:35 PM By: Rosan Harlene DO Entered By: Rosan Harlene on 01/20/2023 13:09:54 -------------------------------------------------------------------------------- Debridement Details Patient Name: Date of Service: [name] V IS, [name] 01/20/2023 1:15 PM Medical Record Number: [id number] Patient Account Number: [id number] Date of Birth/Sex: Treating RN: 09/22/1980 (43 y.o. [name] Primary Care Provider: Celestia Browning Other Clinician: Referring Provider: Treating Provider/Extender: Rosan Harlene Celestia Browning Devra in Treatment: 23 Debridement Performed for Assessment: Wound #4 Right Amputation Site - Transmetatarsal Performed By: Physician Rosan Harlene, DO The following information was scribed by: Drury [name] The information was scribed for: Rosan Harlene Debridement Type: Debridement Severity of Tissue Pre Debridement: Fat layer exposed Level of Consciousness (Pre-procedure): Awake and Alert Pre-procedure Verification/Time Out Yes - 13:50 Taken: Start Time: 13:51 Pain Control: Lidocaine  4% T opical Solution Percent of Wound  Bed Debrided: 125% T Area Debrided (cm): otal 0.82 Tissue and other material debrided: Viable, Non-Viable, Callus, Slough, Subcutaneous, Skin: Dermis , Skin: Epidermis, Slough Level: Skin/Subcutaneous Tissue Debridement Description: Excisional Instrument: Curette Bleeding: Minimum Hemostasis Achieved: Pressure End Time: 13:57 Procedural Pain: 0 Post Procedural Pain: 0 Response to Treatment: Procedure was tolerated well Level of Consciousness (Post- Awake and Alert procedure): Post Debridement Measurements of Total Wound Length: (cm) 1.2 Width: (cm) 0.7 [name], [name] L ([id number]) [id number] Page 2 of 11 Depth: (cm) 0.5 Volume: (cm) 0.33 Character of Wound/Ulcer Post Debridement: Improved Severity of Tissue Post Debridement: Fat layer exposed Post Procedure Diagnosis Same as Pre-procedure Electronic Signature(s) Signed: 01/22/2023 5:26:35 PM By: Rosan Harlene DO Signed: 01/22/2023 5:28:22 PM By: Drury Nestle RN, BSN Entered By: Drury [name] on 01/20/2023 10:57:20 -------------------------------------------------------------------------------- HPI Details Patient Name: Date of Service: [name] V IS, [name] 01/20/2023 1:15 PM Medical Record Number: [id number] Patient Account Number: [id number] Date of Birth/Sex: Treating RN: May 05, 1980 (43 y.o. F) Primary Care Provider: Celestia Browning Other Clinician: Referring Provider: Treating Provider/Extender: Rosan Harlene Celestia Browning Devra in Treatment: 23 History of Present Illness HPI Description: Admission 01/22/2021 [name] a 43 year old female with a past medical history of insulin -dependent uncontrolled type 2 diabetes with last hemoglobin A1c of 13.5, osteomyelitis of the right foot status post transmetatarsal amputation on 12/18/2020 that presents to the clinic for right foot wound. She has had dehiscence of the surgical site. [name] using wet-to-dry dressings. She has a  PICC line and receiving IV ceftriaxone  daily for her osteomyelitis. There [name] an end date of 01/27/2021. [name] also taking oral metronidazole . She currently denies systemic signs of infection. 1/19; patient presents for follow-up. She was diagnosed with a DVT to the right lower extremity 2 days ago. [name] on Eliquis  now. [name] scheduled to see her infectious disease doctor tomorrow. She has been using Dakin's wet-to-dry dressings. She denies systemic signs of infection. 1/26; patient presents for follow-up. She saw infectious disease on 1/21 started on Augmentin .  Her PICC line and IV ceftriaxone  was discontinued. Patient reports stability to her wound. She has been using Dakin's wet-to-dry dressings. She currently denies systemic signs of infection. 2/3; patient presents for follow-up. She continues to use Dakin's wet-to-dry dressings to the wound bed. She saw Dr. Anitra with infectious disease yesterday and [name] continuing Augmentin . T entative end date [name] 2/16. Patient reports following up with orthopedics. She states there [name] no further plan from them. She currently denies systemic signs of infection. 2/10; patient presents for follow-up. She continues to use Dakin's wet to dry dressings. [name] scheduled to have her MRI done on 2/14. She states that she had pain to the debridement site from last clinic visit and declines debridement today. She denies systemic signs of infection. She continues to have yellow thick drainage. 2/20; patient presents for follow-up. She continues to use Dakin's wet-to-dry dressings. She obtained her MRI. The results showed an abscess and [name] scheduled to see her orthopedic surgeon on 2/23. She saw infectious disease 2/17 and her antibiotics were extended. She currently denies systemic signs of infection. 3/6; patient presents for follow-up. She had debridement and irrigation of her foot on 03/10/2021 due to abscess noted on MRI. She was started on IV ceftriaxone  and  oral Flagyl . She has no issues or complaints today. She has been using iodoform packing to the tunnel and Dakin's wet-to-dry to the opening. 03/26/2021: She continues on IV ceftriaxone  and oral metronidazole . She has follow-up with infectious disease tomorrow. No significant issues or complaints today. Her mother continues to help her with her wound dressing, using iodoform packing strips into the tunnel and Dakin's to the open portion of the wound. 3/20; patient presents for follow-up. She continues to be on IV ceftriaxone  in oral metronidazole . She has been using iodoform to the tunnel and Dakin's wet-to- dry to the open wound. She denies signs of infection. 3/27; patient presents for follow-up. She no longer has a PICC line. She has been using iodoform to the tunnel and Dakin's wet-to-dry to the open wound. She reports improvement in wound healing. She denies signs of infection. 4/3; patient presents for follow-up. She states she has been using Hydrofera Blue to the open wound and iodoform packing to the tunnel without any issues. She denies signs of infection. 4/18; patient presents for follow-up. She saw infectious disease on 4/11. She has finished her oral antibiotics and completed a total of 6 weeks of antibiotics (this includes IV as well). No further antibiotics needed. She has been using Hydrofera Blue and iodoform packing. She states that the tunneled area has come in and the iodoform [name] not staying in place anymore. She has no issues or complaints today. She denies signs of infection. 4/24; patient presents for follow-up. She saw Dr. Theresa, plastic surgery to discuss potential skin graft/substitute placement. At this time he thinks that the skin graft would likely not take. He [name] in agreement with trying a wound VAC. Patient has been using Hydrofera Blue dressing changes with no issues. She denies signs of infection. She reports improvement in wound healing. 5/1; patient presents for  follow-up. Unfortunately patient did not have insurance when we ran for the pico. There [name] an assistance program and we are trying to get this accommodated for the patient. In the meantime she has been using Hydrofera Blue without any issues. She denies signs of infection. LENETTA, PICHE ([id number]) [id number] Page 3 of 11 5/8; patient presents for follow-up. We have not heard back if  pico [name] covered by her insurance. She has been using collagen to the wound bed over the past week. She denies signs of infection. 5/18; patient presents for follow-up. She has been using collagen to the wound bed without issues. Again we have not heard if pico [name] covered by her insurance. She has no issues or complaints today. 5/23; patient presents for follow-up. She has been using collagen to the wound bed. She has no issues or complaints today. She obtained the wound VAC from [facility] and brought this in today. She denies signs of infection. 6/1; patient presents for follow-up. She has been using the wound VAC for the past week. She has had this changed twice since she was last here. She reports more maceration to the periwound. She denies signs of infection. 6/7; right TMA site. There are 2 wounds 0 separated by a bridge of healed tissue. The more lateral area has undermining. Both areas have healthy looking granulation at the base but relative the size of the wound [name] fairly deep. There [name] no exposed bone no evidence of infection. Her wound VAC was put on hold last week because of surrounding skin maceration she has been using collagen this week. She has a modified shoe 6/12; patient presents for follow-up. Last week the wound VAC was reinitiated. She had no issues with the wound VAC itself. Today she has maceration again noted to the surrounding skin. She denies signs of infection. 6/27; patient presents for follow-up. She has been using Medihoney to the wound bed. We took a break from the  wound VAC because the periwound was macerated. She still has some areas of maceration to the distal foot where there [name] a callus. She currently denies signs of infection. 7/11; patient presents for follow-up. She has been using Medihoney to the wound bed. She has developed some increased warmth and erythema to the lateral aspect of the right foot. She states this [name] occurred over the past week and there [name] increased pain. No drainage noted. 7/17; patient presents for follow-up. She has been using Medihoney to the wound bed. She completed her course of Bactrim . She reports improvement in symptoms. 7/24; Patient presents for follow up. She has been using Medihoney to the wound bed without issues. She completed another course of Bactrim . She reports improvement in her symptoms but still has some mild tenderness to the medial aspect of the foot. 7/31; Patient presents for follow-up. She has been using Medihoney and Dakin's to the wound bed. She denies signs of infection. 8/14; patient presents for follow-up. She has been using Dakin's wet-to-dry packing strips to the right medial aspect of the amputation site and Medihoney to the anterior site. She has no issues or complaints today. She has started physical therapy. She denies signs of infection. 8/29; patient presents for follow-up. She has been using Dakin's wet-to-dry packing strips to the right medial aspect of the amputation site however this [name] becoming more difficult to place. She did report that she had increased redness and swelling to that site and developed some drainage. It has resolved. She continues with physical therapy. 9/8; patient presents for follow-up. We have been using silver alginate to the tunneled area. She completed her course of antibiotics. She reports no pain, increased swelling or erythema. 9/21; patient presents for follow-up. She has been using gentamicin  to the tunneled area. She has no issues or complaints today. She  denies signs of infection. 10/2; patient presents for follow-up. [name] scheduled to have her  CT scan on 10/9. She currently denies signs of infection. She denies increased warmth, erythema or purulent drainage from the wound bed. She has been using Dakin's wet-to-dry dressings. 10/12; patient presents for follow-up. She had her CT scan on 10/9 that showed A small irregular rim-enhancing fluid pocket communicating to the overlying soft tissues of the sinus tract compatible with a small abscess. Currently she denies systemic signs of infection. She has been doing Dakin's wet-to-dry packing strips but it [name] hard for her to pack into the narrow opening. 10/30; patient presents for follow-up. Since last clinic visit she has had OR debridement of her Right foot by Dr. Barton Due to abscess noted on CT. She has been using iodoform packing. 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 She will complete 2 weeks of this and continue with oral amoxicillin  for the next 6 to 12 months. She follows with Dr. Comer for this. She currently denies signs of infection. 11/13; patient presents for follow-up. Patient has been using silver alginate with gentamicin  to the wound bed. She has no issues or complaints today she. She reports improvement in wound healing. 12/4; patient presents for follow-up. She has been using silver alginate and gentamicin  to the wound bed. She has no issues or complaints today. 12/8; patient presents for follow-up. She has been using silver alginate with gentamicin  to the wound bed. She presents for her first cast placement. She will be back early next week for her obligatory cast change. 12/12; patient presents for follow-up. We have been using collagen with antibiotic ointment to the wound bed under a total contact cast. She has tolerated this well. [name] improved in wound healing. 12/18; patient presents for follow-up. We have been using collagen  with antibiotic ointment under the total contact cast. She has no complaints today. 1/2; The cast was placed at last clinic visit however patient missed her follow-up and this was taken off last week at a nurse visit. Patient has been using packing strips to the medial narrowed wound. She has noted no drainage. 1/22; patient presents for follow-up. She has been keeping the area covered. She reports no drainage. She has had no issues to the previous wound site. She denies any signs of infection including increased warmth, erythema or purulent drainage. 08/08/2022 Ms. Shawnda Mauney [name] a 43 year old female with a past medical history of insulin -dependent uncontrolled type 2 diabetes with last hemoglobin A1c of 8.8, chronic osteomyelitis of the right foot status post transmetatarsal amputation on 12/18/2020 that has been previously treated for surgical dehiscence of this site in our clinic. She presents today 6 months after discharge from our clinic due to healed right foot wound with now reopening of the previous surgical site. She reports chronic pain to the site however denies purulent drainage, increased warmth or erythema. She noticed the wound opening about 1 month ago. She has been doing physical therapy without significant issues. She has inserts to her shoe however she has been advised to follow-up with [facility] for new custom inserts by her orthopedic surgeon now that she has a new wound. She states she will try and do this. She has been using antibiotic ointment to the wound bed. 8/1; patient presents for follow-up. She has been taking her antibiotics without issues. She has been using Dakin's wet-to-dry dressings to the wound bed. The wound [name] smaller. She denies systemic signs of infection. 8/13; patient presents for follow-up. She had ABIs completed that were normal. On  the right it was 1.02 and on the left was 0.99. She has been using silver alginate to the wound bed with gentamicin  ointment.  She states it [name] hard to pack the wound bed. She currently denies signs of infection. 8/29; patient presents for follow-up. She has been using collagen with antibiotic ointment to the wound bed. Wound [name] stable. She denies signs of infection. KYM, SCANNELL ([id number]) [id number] Page 4 of 11 9/5; patient presents for follow-up. She has been using silver alginate to the wound bed. Wound [name] slightly wider. She denies signs of infection. 9/12; patient presents for follow-up. She has been using silver alginate to the wound bed. Wound [name] slightly smaller today. 9/19; right foot in the setting of a TMA over the first metatarsal head. She has thick callus around this area small fissured wound which actually looks like it [name] attempting to close. She has a offloading shoe I am not sure how much activity [name] in. I did not have a lot of time to see her today because of transportation 9/26; right foot in the setting of a previous TMA. The wound [name] over the plantar first metatarsal head. This [name] a fissured wound with thick callus around the margins. It [name] small in terms of overall surface area but has a depth of about 0.6 cm there was no palpable bone. She has a history of chronic osteomyelitis although she has been treated for this I have reviewed CT scans from 2023 also an MRI from 10/26/2021. Neither imaging study showed osteomyelitis in this area although the CT scan suggested osteomyelitis in the fourth metatarsal head. There has been problems with abscesses communicating with the wound although there does not appear to be purulent drainage currently 10/3; right foot at the TMA site. Small probing wound. I changed her to Iodosorb with gauze last time. May be some improvement. She has a depth of about 0.6 cm but there [name] no palpable bone. She has been previously treated for chronic osteomyelitis in this area. 10/17; this [name] a patient with a probing wound at the right TMA  site. Underlying osteomyelitis previously treated earlier this year. I been using Iodosorb to try and help constrict this wound however today she comes in with a larger circumference a larger tunnel and this probably sits right on top of bone. Also worrisome [name] there was a streaking dark area just caudal to the wound on the dorsal surface of the foot. The patient [name] complaining of more pain but no fever. 10/24; culture of this area [bone scraping] revealed Acinetobacter and 2 coag negative staph. I am going to give her Septra  DS 1 p.o. twice daily for 2 weeks. I think it would be simplistic to call this a bone culture but it certainly was a deep culture. An x-ray was done but that has not been reported yet. I had wanted to get lab work including a CBC with differential ESR and C-reactive protein but I am not actually sure that got done last week. We have been using gent and silver alginate. 11/7; patient [name] still taking trimethoprim /sulfamethoxazole  for the assessment of bacterial identified last week. Lab work revealed a C-reactive protein of 4.1 which [name] elevated from 1.7 but not dramatic. White count was normal at 8.4 x-ray of the right foot revealed minimal new lucency and cortical erosion within the adjacent distal plantar aspect of the metatarsals concerning for acute osteomyelitis early. 11/19; the patient has completed her Septra  DS which I gave to  her for 2 weeks. She has her MRI tomorrow but then we will have the obligatory 5 to 10- day/workday wait to get a report. I am going to resume her Septra  DS for a further 2 weeks. Then decide on additional antibiotics, infectious disease etc. 12/5; the patient has completed the Septra  DS for the originally cultured as Enterobacter on a bone scraping. Her MRI somewhat surprisingly showed status post amputation of the 1st through 5th metatarsal base but no evidence of osteomyelitis. Surrounding skin thickening with mild edema could represent  cellulitis. Previous x-ray showed the suggestion of bone erosion although this was not verified by her MRI. 12/9; patient comes in today for follow-up. She tolerated the total contact cast well. The areas on the right TMA laterally. Medially she has undermining. What I can see of the granulation looks healthy. We are using Prisma and we placed the total contact cast again last week 12/17;. She has a area on the right TMA medially. This seems to have contracted quite a bit this week. There [name] still 1 cm of depth. No palpable bone. No purulent drainage. Again MRI did not suggest underlying osteomyelitis. She has completed the antibiotics I gave her for S Enterobacter. We put her back in a total contact cast last week using Prisma as the primary dressing 12/23; right TMA medially. This [name] a weightbearing surface we now have her in a total contact cast. We have been using Prisma and gentamicin . The gentamicin  to target S adeno bacteria. [name] completed a course of Septra . MRI did not underlying osteomyelitis. 01/13/2023: The length and depth of the wound, including undermining, has decreased a bit since last week. She has some slough accumulation on the surface as well as extensive callus buildup. 01/20/2023; patient presents for follow-up. We have been using Prisma with gentamicin  ointment under a total contact cast. She has no issues or complaints today. She denies signs of infection. Electronic Signature(s) Signed: 01/22/2023 5:26:35 PM By: Rosan Raisin DO Entered By: Rosan Raisin on 01/20/2023 13:10:54 -------------------------------------------------------------------------------- Physical Exam Details Patient Name: Date of Service: [name] V IS, [name] 01/20/2023 1:15 PM Medical Record Number: [id number] Patient Account Number: [id number] Date of Birth/Sex: Treating RN: 04-07-1980 (43 y.o. F) Primary Care Provider: Celestia Browning Other Clinician: Referring Provider: Treating  Provider/Extender: Rosan Raisin Celestia Browning Devra in Treatment: 23 Constitutional respirations regular, non-labored and within target range for patient.. Cardiovascular 2+ dorsalis pedis/posterior tibialis pulses. ZARETH, RIPPETOE ([id number]) [id number] Page 5 of 11 Psychiatric pleasant and cooperative. Notes Open wound to previous transmetatarsal tarsal amputation dehised site. Granulation tissue with slough accumulation noted at the wound bed. Undermining circumferentially. No signs of surrounding infection including increased warmth, erythema or purulent drainage. Electronic Signature(s) Signed: 01/22/2023 5:26:35 PM By: Rosan Raisin DO Entered By: Rosan Raisin on 01/20/2023 13:18:07 -------------------------------------------------------------------------------- Physician Orders Details Patient Name: Date of Service: [name] V IS, [name] 01/20/2023 1:15 PM Medical Record Number: [id number] Patient Account Number: [id number] Date of Birth/Sex: Treating RN: 1980-01-15 (43 y.o. [name] Primary Care Provider: Celestia Browning Other Clinician: Referring Provider: Treating Provider/Extender: Rosan Raisin Celestia Browning Devra in Treatment: 23 The following information was scribed by: Drury [name] The information was scribed for: Rosan Raisin Verbal / Phone Orders: No Diagnosis Coding ICD-10 Coding Code Description L97.512 Non-pressure chronic ulcer of other part of right foot with fat layer exposed E11.621 Type 2 diabetes mellitus with foot ulcer M86.671 Other chronic osteomyelitis, right ankle and foot Follow-up Appointments ppointment in 1  week. - Dr. Rosan 01/27/23 at 1:15PM (already scheduled) Return A ppointment in 2 weeks. - Dr. Rosan 02/03/23 at 1:15PM (already scheduled) Return A Return appointment in 3 weeks. - Dr. Rosan 02/10/23 at 1:15PM (already scheduled) Return appointment in 1 month. - ***Dr.  Evalisse Prajapati****January 2025 (front office to schedule) Anesthetic (In clinic) Topical Lidocaine  5% applied to wound bed Bathing/ Shower/ Hygiene May shower with protection but do not get wound dressing(s) wet. Protect dressing(s) with water repellant cover (for example, large plastic bag) or a cast cover and may then take shower. Other Bathing/Shower/Hygiene Orders/Instructions: - When changing the dressing (every other day) you may get right foot wet. On days NOT changing the dressing.Keep wound/dressing dry Off-Loading Total Contact Cast to Right Lower Extremity - size 4 Wound Treatment Wound #4 - Amputation Site - Transmetatarsal Wound Laterality: Right Cleanser: Soap and Water 1 x Per Week/30 Days Discharge Instructions: May shower and wash wound with dial  antibacterial soap and water prior to dressing change. On days not changing the dressing, please keep wound/dressing dry Topical: Gentamicin  1 x Per Week/30 Days Discharge Instructions: As directed by physician Prim Dressing: Promogran Prisma Matrix, 4.34 (sq in) (silver collagen) 1 x Per Week/30 Days ary Discharge Instructions: Moisten collagen with saline or hydrogel Secondary Dressing: ABD Pad, 8x10 1 x Per Week/30 Days Discharge Instructions: Apply over primary dressing as directed. Secondary Dressing: Woven Gauze Sponge, Non-Sterile 4x4 in 1 x Per Week/30 Days Discharge Instructions: Apply over primary dressing as directed. Secondary Dressing: Zetuvit Plus 4x8 in 1 x Per Week/30 Days ELDINE, RENCHER L ([id number]) [phone].pdf Page 6 of 11 Discharge Instructions: Apply over primary dressing as directed. Secured With: [employer], 4.5x3.1 (in/yd) 1 x Per Week/30 Days Discharge Instructions: Secure with Kerlix as directed. Secured With: 67M Medipore H Soft Cloth Surgical T ape, 4 x 10 (in/yd) 1 x Per Week/30 Days Discharge Instructions: Secure with tape as directed. Secured With: total contact cast  size 4 1 x Per Week/30 Days Discharge Instructions: last layer applied by provider. Electronic Signature(s) Signed: 01/22/2023 5:26:35 PM By: Rosan Raisin DO Entered By: Rosan Raisin on 01/20/2023 13:18:16 -------------------------------------------------------------------------------- Problem List Details Patient Name: Date of Service: [name] V IS, [name] 01/20/2023 1:15 PM Medical Record Number: [id number] Patient Account Number: [id number] Date of Birth/Sex: Treating RN: 1980/06/18 (43 y.o. [name] Primary Care Provider: Celestia Browning Other Clinician: Referring Provider: Treating Provider/Extender: Rosan Raisin Celestia Browning Devra in Treatment: 23 Active Problems ICD-10 Encounter Code Description Active Date MDM Diagnosis L97.512 Non-pressure chronic ulcer of other part of right foot with fat layer exposed 08/08/2022 No Yes E11.621 Type 2 diabetes mellitus with foot ulcer 08/08/2022 No Yes M86.671 Other chronic osteomyelitis, right ankle and foot 08/08/2022 No Yes Inactive Problems Resolved Problems Electronic Signature(s) Signed: 01/22/2023 5:26:35 PM By: Rosan Raisin DO Entered By: Rosan Raisin on 01/20/2023 13:08:04 -------------------------------------------------------------------------------- Progress Note Details Patient Name: Date of Service: [name] V IS, [name] 01/20/2023 1:15 PM Medical Record Number: [id number] Patient Account Number: [id number] Date of Birth/Sex: Treating RN: Aug 02, 1980 (43 y.o. F) Primary Care Provider: Celestia Browning Other Clinician: MYANNA, ZIESMER ([id number]) [id number] Page 7 of 11 Referring Provider: Treating Provider/Extender: Rosan Raisin Celestia Browning Devra in Treatment: 23 Subjective Chief Complaint Information obtained from Patient 01/22/2021; Osteomyelitis of the right foot status post transmetatarsal amputation with surgical site dehiscence 08/08/2022; reopening of  wound to the right foot with a history of transmetatarsal amputation with previous difficulty healing surgical site History of Present  Illness (HPI) Admission 01/22/2021 Ms. Katharin Schneider [name] a 43 year old female with a past medical history of insulin -dependent uncontrolled type 2 diabetes with last hemoglobin A1c of 13.5, osteomyelitis of the right foot status post transmetatarsal amputation on 12/18/2020 that presents to the clinic for right foot wound. She has had dehiscence of the surgical site. [name] using wet-to-dry dressings. She has a PICC line and receiving IV ceftriaxone  daily for her osteomyelitis. There [name] an end date of 01/27/2021. [name] also taking oral metronidazole . She currently denies systemic signs of infection. 1/19; patient presents for follow-up. She was diagnosed with a DVT to the right lower extremity 2 days ago. [name] on Eliquis  now. [name] scheduled to see her infectious disease doctor tomorrow. She has been using Dakin's wet-to-dry dressings. She denies systemic signs of infection. 1/26; patient presents for follow-up. She saw infectious disease on 1/21 started on Augmentin . Her PICC line and IV ceftriaxone  was discontinued. Patient reports stability to her wound. She has been using Dakin's wet-to-dry dressings. She currently denies systemic signs of infection. 2/3; patient presents for follow-up. She continues to use Dakin's wet-to-dry dressings to the wound bed. She saw Dr. Anitra with infectious disease yesterday and [name] continuing Augmentin . T entative end date [name] 2/16. Patient reports following up with orthopedics. She states there [name] no further plan from them. She currently denies systemic signs of infection. 2/10; patient presents for follow-up. She continues to use Dakin's wet to dry dressings. [name] scheduled to have her MRI done on 2/14. She states that she had pain to the debridement site from last clinic visit and declines debridement today. She denies  systemic signs of infection. She continues to have yellow thick drainage. 2/20; patient presents for follow-up. She continues to use Dakin's wet-to-dry dressings. She obtained her MRI. The results showed an abscess and [name] scheduled to see her orthopedic surgeon on 2/23. She saw infectious disease 2/17 and her antibiotics were extended. She currently denies systemic signs of infection. 3/6; patient presents for follow-up. She had debridement and irrigation of her foot on 03/10/2021 due to abscess noted on MRI. She was started on IV ceftriaxone  and oral Flagyl . She has no issues or complaints today. She has been using iodoform packing to the tunnel and Dakin's wet-to-dry to the opening. 03/26/2021: She continues on IV ceftriaxone  and oral metronidazole . She has follow-up with infectious disease tomorrow. No significant issues or complaints today. Her mother continues to help her with her wound dressing, using iodoform packing strips into the tunnel and Dakin's to the open portion of the wound. 3/20; patient presents for follow-up. She continues to be on IV ceftriaxone  in oral metronidazole . She has been using iodoform to the tunnel and Dakin's wet-to- dry to the open wound. She denies signs of infection. 3/27; patient presents for follow-up. She no longer has a PICC line. She has been using iodoform to the tunnel and Dakin's wet-to-dry to the open wound. She reports improvement in wound healing. She denies signs of infection. 4/3; patient presents for follow-up. She states she has been using Hydrofera Blue to the open wound and iodoform packing to the tunnel without any issues. She denies signs of infection. 4/18; patient presents for follow-up. She saw infectious disease on 4/11. She has finished her oral antibiotics and completed a total of 6 weeks of antibiotics (this includes IV as well). No further antibiotics needed. She has been using Hydrofera Blue and iodoform packing. She states that the  tunneled  area has come in and the iodoform [name] not staying in place anymore. She has no issues or complaints today. She denies signs of infection. 4/24; patient presents for follow-up. She saw Dr. Theresa, plastic surgery to discuss potential skin graft/substitute placement. At this time he thinks that the skin graft would likely not take. He [name] in agreement with trying a wound VAC. Patient has been using Hydrofera Blue dressing changes with no issues. She denies signs of infection. She reports improvement in wound healing. 5/1; patient presents for follow-up. Unfortunately patient did not have insurance when we ran for the pico. There [name] an assistance program and we are trying to get this accommodated for the patient. In the meantime she has been using Hydrofera Blue without any issues. She denies signs of infection. 5/8; patient presents for follow-up. We have not heard back if pico [name] covered by her insurance. She has been using collagen to the wound bed over the past week. She denies signs of infection. 5/18; patient presents for follow-up. She has been using collagen to the wound bed without issues. Again we have not heard if pico [name] covered by her insurance. She has no issues or complaints today. 5/23; patient presents for follow-up. She has been using collagen to the wound bed. She has no issues or complaints today. She obtained the wound VAC from [facility] and brought this in today. She denies signs of infection. 6/1; patient presents for follow-up. She has been using the wound VAC for the past week. She has had this changed twice since she was last here. She reports more maceration to the periwound. She denies signs of infection. 6/7; right TMA site. There are 2 wounds 0 separated by a bridge of healed tissue. The more lateral area has undermining. Both areas have healthy looking granulation at the base but relative the size of the wound [name] fairly deep. There [name] no exposed bone no evidence of  infection. Her wound VAC was put on hold last week because of surrounding skin maceration she has been using collagen this week. She has a modified shoe 6/12; patient presents for follow-up. Last week the wound VAC was reinitiated. She had no issues with the wound VAC itself. Today she has maceration again noted to the surrounding skin. She denies signs of infection. 6/27; patient presents for follow-up. She has been using Medihoney to the wound bed. We took a break from the wound VAC because the periwound was macerated. She still has some areas of maceration to the distal foot where there [name] a callus. She currently denies signs of infection. 7/11; patient presents for follow-up. She has been using Medihoney to the wound bed. She has developed some increased warmth and erythema to the lateral aspect of the right foot. She states this [name] occurred over the past week and there [name] increased pain. No drainage noted. 7/17; patient presents for follow-up. She has been using Medihoney to the wound bed. She completed her course of Bactrim . She reports improvement in symptoms. MITSUYE, SCHRODT ([id number]) [id number] Page 8 of 11 7/24; Patient presents for follow up. She has been using Medihoney to the wound bed without issues. She completed another course of Bactrim . She reports improvement in her symptoms but still has some mild tenderness to the medial aspect of the foot. 7/31; Patient presents for follow-up. She has been using Medihoney and Dakin's to the wound bed. She denies signs of infection. 8/14; patient presents for follow-up. She has been using Dakin's wet-to-dry  packing strips to the right medial aspect of the amputation site and Medihoney to the anterior site. She has no issues or complaints today. She has started physical therapy. She denies signs of infection. 8/29; patient presents for follow-up. She has been using Dakin's wet-to-dry packing strips to the right  medial aspect of the amputation site however this [name] becoming more difficult to place. She did report that she had increased redness and swelling to that site and developed some drainage. It has resolved. She continues with physical therapy. 9/8; patient presents for follow-up. We have been using silver alginate to the tunneled area. She completed her course of antibiotics. She reports no pain, increased swelling or erythema. 9/21; patient presents for follow-up. She has been using gentamicin  to the tunneled area. She has no issues or complaints today. She denies signs of infection. 10/2; patient presents for follow-up. [name] scheduled to have her CT scan on 10/9. She currently denies signs of infection. She denies increased warmth, erythema or purulent drainage from the wound bed. She has been using Dakin's wet-to-dry dressings. 10/12; patient presents for follow-up. She had her CT scan on 10/9 that showed A small irregular rim-enhancing fluid pocket communicating to the overlying soft tissues of the sinus tract compatible with a small abscess. Currently she denies systemic signs of infection. She has been doing Dakin's wet-to-dry packing strips but it [name] hard for her to pack into the narrow opening. 10/30; patient presents for follow-up. Since last clinic visit she has had OR debridement of her Right foot by Dr. Barton Due to abscess noted on CT. She has been using iodoform packing. 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 She will complete 2 weeks of this and continue with oral amoxicillin  for the next 6 to 12 months. She follows with Dr. Comer for this. She currently denies signs of infection. 11/13; patient presents for follow-up. Patient has been using silver alginate with gentamicin  to the wound bed. She has no issues or complaints today she. She reports improvement in wound healing. 12/4; patient presents for follow-up. She has been using silver  alginate and gentamicin  to the wound bed. She has no issues or complaints today. 12/8; patient presents for follow-up. She has been using silver alginate with gentamicin  to the wound bed. She presents for her first cast placement. She will be back early next week for her obligatory cast change. 12/12; patient presents for follow-up. We have been using collagen with antibiotic ointment to the wound bed under a total contact cast. She has tolerated this well. [name] improved in wound healing. 12/18; patient presents for follow-up. We have been using collagen with antibiotic ointment under the total contact cast. She has no complaints today. 1/2; The cast was placed at last clinic visit however patient missed her follow-up and this was taken off last week at a nurse visit. Patient has been using packing strips to the medial narrowed wound. She has noted no drainage. 1/22; patient presents for follow-up. She has been keeping the area covered. She reports no drainage. She has had no issues to the previous wound site. She denies any signs of infection including increased warmth, erythema or purulent drainage. 08/08/2022 Ms. Jaquelyn Sakamoto [name] a 43 year old female with a past medical history of insulin -dependent uncontrolled type 2 diabetes with last hemoglobin A1c of 8.8, chronic osteomyelitis of the right foot status post transmetatarsal amputation on 12/18/2020 that has been previously treated for surgical dehiscence of this  site in our clinic. She presents today 6 months after discharge from our clinic due to healed right foot wound with now reopening of the previous surgical site. She reports chronic pain to the site however denies purulent drainage, increased warmth or erythema. She noticed the wound opening about 1 month ago. She has been doing physical therapy without significant issues. She has inserts to her shoe however she has been advised to follow-up with [employer] for new custom inserts by her  orthopedic surgeon now that she has a new wound. She states she will try and do this. She has been using antibiotic ointment to the wound bed. 8/1; patient presents for follow-up. She has been taking her antibiotics without issues. She has been using Dakin's wet-to-dry dressings to the wound bed. The wound [name] smaller. She denies systemic signs of infection. 8/13; patient presents for follow-up. She had ABIs completed that were normal. On the right it was 1.02 and on the left was 0.99. She has been using silver alginate to the wound bed with gentamicin  ointment. She states it [name] hard to pack the wound bed. She currently denies signs of infection. 8/29; patient presents for follow-up. She has been using collagen with antibiotic ointment to the wound bed. Wound [name] stable. She denies signs of infection. 9/5; patient presents for follow-up. She has been using silver alginate to the wound bed. Wound [name] slightly wider. She denies signs of infection. 9/12; patient presents for follow-up. She has been using silver alginate to the wound bed. Wound [name] slightly smaller today. 9/19; right foot in the setting of a TMA over the first metatarsal head. She has thick callus around this area small fissured wound which actually looks like it [name] attempting to close. She has a offloading shoe I am not sure how much activity [name] in. I did not have a lot of time to see her today because of transportation 9/26; right foot in the setting of a previous TMA. The wound [name] over the plantar first metatarsal head. This [name] a fissured wound with thick callus around the margins. It [name] small in terms of overall surface area but has a depth of about 0.6 cm there was no palpable bone. She has a history of chronic osteomyelitis although she has been treated for this I have reviewed CT scans from 2023 also an MRI from 10/26/2021. Neither imaging study showed osteomyelitis in this area although the CT scan suggested osteomyelitis in the  fourth metatarsal head. There has been problems with abscesses communicating with the wound although there does not appear to be purulent drainage currently 10/3; right foot at the TMA site. Small probing wound. I changed her to Iodosorb with gauze last time. May be some improvement. She has a depth of about 0.6 cm but there [name] no palpable bone. She has been previously treated for chronic osteomyelitis in this area. 10/17; this [name] a patient with a probing wound at the right TMA site. Underlying osteomyelitis previously treated earlier this year. I been using Iodosorb to try and help constrict this wound however today she comes in with a larger circumference a larger tunnel and this probably sits right on top of bone. Also worrisome [name] there was a streaking dark area just caudal to the wound on the dorsal surface of the foot. The patient [name] complaining of more pain but no fever. 10/24; culture of this area [bone scraping] revealed Acinetobacter and 2 coag negative staph. I am going to give her Septra   DS 1 p.o. twice daily for 2 weeks. I think it would be simplistic to call this a bone culture but it certainly was a deep culture. An x-ray was done but that has not been reported yet. I had wanted to get lab work including a CBC with differential ESR and C-reactive protein but I am not actually sure that got done last week. We have been using gent and Escalera, Debbera L ([id number]) [id number] Page 9 of 11 silver alginate. 11/7; patient [name] still taking trimethoprim /sulfamethoxazole  for the assessment of bacterial identified last week. Lab work revealed a C-reactive protein of 4.1 which [name] elevated from 1.7 but not dramatic. White count was normal at 8.4 x-ray of the right foot revealed minimal new lucency and cortical erosion within the adjacent distal plantar aspect of the metatarsals concerning for acute osteomyelitis early. 11/19; the patient has completed her Septra  DS which I  gave to her for 2 weeks. She has her MRI tomorrow but then we will have the obligatory 5 to 10- day/workday wait to get a report. I am going to resume her Septra  DS for a further 2 weeks. Then decide on additional antibiotics, infectious disease etc. 12/5; the patient has completed the Septra  DS for the originally cultured as Enterobacter on a bone scraping. Her MRI somewhat surprisingly showed status post amputation of the 1st through 5th metatarsal base but no evidence of osteomyelitis. Surrounding skin thickening with mild edema could represent cellulitis. Previous x-ray showed the suggestion of bone erosion although this was not verified by her MRI. 12/9; patient comes in today for follow-up. She tolerated the total contact cast well. The areas on the right TMA laterally. Medially she has undermining. What I can see of the granulation looks healthy. We are using Prisma and we placed the total contact cast again last week 12/17;. She has a area on the right TMA medially. This seems to have contracted quite a bit this week. There [name] still 1 cm of depth. No palpable bone. No purulent drainage. Again MRI did not suggest underlying osteomyelitis. She has completed the antibiotics I gave her for S Enterobacter. We put her back in a total contact cast last week using Prisma as the primary dressing 12/23; right TMA medially. This [name] a weightbearing surface we now have her in a total contact cast. We have been using Prisma and gentamicin . The gentamicin  to target S adeno bacteria. [name] completed a course of Septra . MRI did not underlying osteomyelitis. 01/13/2023: The length and depth of the wound, including undermining, has decreased a bit since last week. She has some slough accumulation on the surface as well as extensive callus buildup. 01/20/2023; patient presents for follow-up. We have been using Prisma with gentamicin  ointment under a total contact cast. She has no issues or complaints today. She  denies signs of infection. Objective Constitutional respirations regular, non-labored and within target range for patient.. Vitals Time Taken: 1:36 PM, Height: 69 in, Weight: 330 lbs, BMI: 48.7, Temperature: 98.3 F, Pulse: 97 bpm, Respiratory Rate: 18 breaths/min, Blood Pressure: 110/71 mmHg. Cardiovascular 2+ dorsalis pedis/posterior tibialis pulses. Psychiatric pleasant and cooperative. General Notes: Open wound to previous transmetatarsal tarsal amputation dehised site. Granulation tissue with slough accumulation noted at the wound bed. Undermining circumferentially. No signs of surrounding infection including increased warmth, erythema or purulent drainage. Integumentary (Hair, Skin) Wound #4 status [name] Open. Original cause of wound was Shear/Friction. The date acquired was: 07/08/2022. The wound has been in treatment 23 weeks. The wound  [name] located on the Right Amputation Site - Transmetatarsal. The wound measures 1.2cm length x 0.7cm width x 0.5cm depth; 0.66cm^2 area and 0.33cm^3 volume. There [name] Fat Layer (Subcutaneous Tissue) exposed. There [name] no tunneling or undermining noted. There [name] a large amount of serosanguineous drainage noted. The wound margin [name] thickened. There [name] large (67-100%) red, pink granulation within the wound bed. There [name] a small (1-33%) amount of necrotic tissue within the wound bed. The periwound skin appearance had no abnormalities noted for color. The periwound skin appearance exhibited: Callus, Maceration. The periwound skin appearance did not exhibit: Crepitus, Excoriation, Induration, Rash, Scarring, Dry/Scaly. Periwound temperature was noted as No Abnormality. The periwound has tenderness on palpation. Assessment Active Problems ICD-10 Non-pressure chronic ulcer of other part of right foot with fat layer exposed Type 2 diabetes mellitus with foot ulcer Other chronic osteomyelitis, right ankle and foot Patient's wound [name] stable. No signs of surrounding  infection. [name] doing well with the total contact cast. She had no issues over the past week. I debrided nonviable tissue. I recommended continuing the course with collagen and antibiotic ointment under the total contact cast. Follow-up in 1 week. Procedures Wound #4 ELIANI, LECLERE L ([id number]) [id number] Page 10 of 11 Pre-procedure diagnosis of Wound #4 [name] a Diabetic Wound/Ulcer of the Lower Extremity located on the Right Amputation Site - Transmetatarsal .Severity of Tissue Pre Debridement [name]: Fat layer exposed. There was a Excisional Skin/Subcutaneous Tissue Debridement with a total area of 0.82 sq cm performed by Rosan Raisin, DO. With the following instrument(s): Curette to remove Viable and Non-Viable tissue/material. Material removed includes Callus, Subcutaneous Tissue, Slough, Skin: Dermis, and Skin: Epidermis after achieving pain control using Lidocaine  4% T opical Solution. A time out was conducted at 13:50, prior to the start of the procedure. A Minimum amount of bleeding was controlled with Pressure. The procedure was tolerated well with a pain level of 0 throughout and a pain level of 0 following the procedure. Post Debridement Measurements: 1.2cm length x 0.7cm width x 0.5cm depth; 0.33cm^3 volume. Character of Wound/Ulcer Post Debridement [name] improved. Severity of Tissue Post Debridement [name]: Fat layer exposed. 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 There was a T Contact Cast Procedure by Rosan Raisin, DO. otal Post procedure Diagnosis Wound #4: Same as Pre-Procedure Notes: size 4. Plan Follow-up Appointments: Return Appointment in 1 week. - Dr. Rosan 01/27/23 at 1:15PM (already scheduled) Return Appointment in 2 weeks. - Dr. Rosan 02/03/23 at 1:15PM (already scheduled) Return  appointment in 3 weeks. - Dr. Rosan 02/10/23 at 1:15PM (already scheduled) Return appointment in 1 month. - ***Dr. Eustace Hur****January 2025 (front office to schedule) Anesthetic: (In clinic) Topical Lidocaine  5% applied to wound bed Bathing/ Shower/ Hygiene: May shower with protection but do not get wound dressing(s) wet. Protect dressing(s) with water repellant cover (for example, large plastic bag) or a cast cover and may then take shower. Other Bathing/Shower/Hygiene Orders/Instructions: - When changing the dressing (every other day) you may get right foot wet. On days NOT changing the dressing.Keep wound/dressing dry Off-Loading: T Contact Cast to Right Lower Extremity - size 4 otal WOUND #4: - Amputation Site - Transmetatarsal Wound Laterality: Right Cleanser: Soap and Water 1 x Per Week/30 Days Discharge Instructions: May shower and wash wound with dial  antibacterial soap and water prior to dressing change. On days not changing the  dressing, please keep wound/dressing dry Topical: Gentamicin  1 x Per Week/30 Days Discharge Instructions: As directed by physician Prim Dressing: Promogran Prisma Matrix, 4.34 (sq in) (silver collagen) 1 x Per Week/30 Days ary Discharge Instructions: Moisten collagen with saline or hydrogel Secondary Dressing: ABD Pad, 8x10 1 x Per Week/30 Days Discharge Instructions: Apply over primary dressing as directed. Secondary Dressing: Woven Gauze Sponge, Non-Sterile 4x4 in 1 x Per Week/30 Days Discharge Instructions: Apply over primary dressing as directed. Secondary Dressing: Zetuvit Plus 4x8 in 1 x Per Week/30 Days Discharge Instructions: Apply over primary dressing as directed. Secured With: [employer], 4.5x3.1 (in/yd) 1 x Per Week/30 Days Discharge Instructions: Secure with Kerlix as directed. Secured With: 64M Medipore H Soft Cloth Surgical T ape, 4 x 10 (in/yd) 1 x Per Week/30 Days Discharge Instructions: Secure with tape as directed. Secured  With: total contact cast size 4 1 x Per Week/30 Days Discharge Instructions: last layer applied by provider. 1. In office sharp debridement 2. T contact cast placed in standard fashion to the right lower extremity otal 3. Collagen 4. Antibiotic ointment 5. Follow-up in 1 week Electronic Signature(s) Signed: 01/22/2023 5:26:35 PM By: Rosan Raisin DO Entered By: Rosan Raisin on 01/20/2023 13:19:57 -------------------------------------------------------------------------------- Total Contact Cast Details Patient Name: Date of Service: [name] GAILS IS, [name] 01/20/2023 1:15 PM Medical Record Number: [id number] Patient Account Number: [id number] Date of Birth/Sex: Treating RN: [birth year] (43 y.o. [name] Primary Care Provider: Celestia Browning Other Clinician: Referring Provider: Treating Provider/Extender: Rosan Raisin Celestia Browning Devra in Treatment: [address] [place] ([id number]) [id number] Page 11 of 11 T Contact Cast Applied for Wound Assessment: otal Wound #4 Right Amputation Site - Transmetatarsal Performed By: Physician Rosan Raisin, DO The following information was scribed by: Drury [name] The information was scribed for: Rosan Raisin Post Procedure Diagnosis Same as Pre-procedure Notes size 4 Electronic Signature(s) Signed: 01/22/2023 5:26:35 PM By: Rosan Raisin DO Signed: 01/22/2023 5:28:22 PM By: Drury Nestle RN, BSN Entered By: Drury [name] on 01/20/2023 10:53:38 -------------------------------------------------------------------------------- SuperBill Details Patient Name: Date of Service: [name] V IS, [name] 01/20/2023 Medical Record Number: [id number] Patient Account Number: [id number] Date of Birth/Sex: Treating RN: 11-03-1980 (43 y.o. 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

## 2023-01-27 ENCOUNTER — Encounter (HOSPITAL_BASED_OUTPATIENT_CLINIC_OR_DEPARTMENT_OTHER): Payer: Medicaid Other | Admitting: Internal Medicine

## 2023-01-27 DIAGNOSIS — L97512 Non-pressure chronic ulcer of other part of right foot with fat layer exposed: Secondary | ICD-10-CM | POA: Diagnosis not present

## 2023-01-27 DIAGNOSIS — E11621 Type 2 diabetes mellitus with foot ulcer: Secondary | ICD-10-CM | POA: Diagnosis not present

## 2023-01-29 NOTE — Progress Notes (Signed)
 TAMIA, DIAL ([id number]) [id number] Page 1 of 11 Visit Report for 01/27/2023 Chief Complaint Document Details Patient Name: Date of Service: ARLINE GAILS IS, [place] [name] 01/27/2023 1:15 PM Medical Record Number: [id number] Patient Account Number: [id number] Date of Birth/Sex: Treating RN: March 21, 1980 (43 y.o. F) Primary Care Provider: Celestia Browning Other Clinician: Referring Provider: Treating Provider/Extender: Rosan Harlene Celestia Browning Devra in Treatment: 24 Information Obtained from: Patient Chief Complaint 01/22/2021; Osteomyelitis of the right foot status post transmetatarsal amputation with surgical site dehiscence 08/08/2022; reopening of wound to the right foot with a history of transmetatarsal amputation with previous difficulty healing surgical site Electronic Signature(s) Signed: 01/27/2023 5:12:23 PM By: Rosan Harlene DO Entered By: Rosan Harlene on 01/27/2023 15:17:35 -------------------------------------------------------------------------------- Debridement Details Patient Name: Date of Service: [name], [name] 01/27/2023 1:15 PM Medical Record Number: [id number] Patient Account Number: [id number] Date of Birth/Sex: Treating RN: 1980/04/04 (43 y.o. JEANELL Drury Nestle Primary Care Provider: Celestia Browning Other Clinician: Referring Provider: Treating Provider/Extender: Rosan Harlene Celestia Browning Devra in Treatment: 24 Debridement Performed for Assessment: Wound #4 Right Amputation Site - Transmetatarsal Performed By: Physician Rosan Harlene, DO The following information was scribed by: Drury Nestle The information was scribed for: Rosan Harlene Debridement Type: Debridement Severity of Tissue Pre Debridement: Fat layer exposed Level of Consciousness (Pre-procedure): Awake and Alert Pre-procedure Verification/Time Out Yes - 14:05 Taken: Start Time: 14:06 Pain Control: Lidocaine  4% T opical Solution Percent of  Wound Bed Debrided: 100% T Area Debrided (cm): otal 0.43 Tissue and other material debrided: Viable, Non-Viable, Slough, Subcutaneous, Slough Level: Skin/Subcutaneous Tissue Debridement Description: Excisional Instrument: Curette Bleeding: Moderate Hemostasis Achieved: Pressure End Time: 14:09 Procedural Pain: 0 Post Procedural Pain: 0 Response to Treatment: Procedure was tolerated well Level of Consciousness (Post- Awake and Alert procedure): Post Debridement Measurements of Total Wound Length: (cm) 1.1 Width: (cm) 0.5 Posa, Baillie L ([id number]) [id number] Page 2 of 11 Depth: (cm) 0.4 Volume: (cm) 0.173 Character of Wound/Ulcer Post Debridement: Improved Severity of Tissue Post Debridement: Fat layer exposed Post Procedure Diagnosis Same as Pre-procedure Electronic Signature(s) Signed: 01/27/2023 4:48:15 PM By: Drury Nestle RN, BSN Signed: 01/27/2023 5:12:23 PM By: Rosan Harlene DO Entered By: Drury Nestle on 01/27/2023 14:10:04 -------------------------------------------------------------------------------- HPI Details Patient Name: Date of Service: [name], [name] 01/27/2023 1:15 PM Medical Record Number: [id number] Patient Account Number: [id number] Date of Birth/Sex: Treating RN: 12-10-1980 (43 y.o. F) Primary Care Provider: Celestia Browning Other Clinician: Referring Provider: Treating Provider/Extender: Rosan Harlene Celestia Browning Devra in Treatment: 24 History of Present Illness HPI Description: Admission 01/22/2021 Ms. Safiya Girdler is a 43 year old female with a past medical history of insulin -dependent uncontrolled type 2 diabetes with last hemoglobin A1c of 13.5, osteomyelitis of the right foot status post transmetatarsal amputation on 12/18/2020 that presents to the clinic for right foot wound. She has had dehiscence of the surgical site. She is currently using wet-to-dry dressings. She has a PICC line and receiving IV  ceftriaxone  daily for her osteomyelitis. There is an end date of 01/27/2021. She is also taking oral metronidazole . She currently denies systemic signs of infection. 1/19; patient presents for follow-up. She was diagnosed with a DVT to the right lower extremity 2 days ago. She is on Eliquis  now. She is scheduled to see her infectious disease doctor tomorrow. She has been using Dakin's wet-to-dry dressings. She denies systemic signs of infection. 1/26; patient presents for follow-up. She saw infectious disease on 1/21 started on Augmentin . Her PICC line and IV ceftriaxone   was discontinued. Patient reports stability to her wound. She has been using Dakin's wet-to-dry dressings. She currently denies systemic signs of infection. 2/3; patient presents for follow-up. She continues to use Dakin's wet-to-dry dressings to the wound bed. She saw Dr. Anitra with infectious disease yesterday and is continuing Augmentin . T entative end date is 2/16. Patient reports following up with orthopedics. She states there is no further plan from them. She currently denies systemic signs of infection. 2/10; patient presents for follow-up. She continues to use Dakin's wet to dry dressings. She is scheduled to have her MRI done on 2/14. She states that she had pain to the debridement site from last clinic visit and declines debridement today. She denies systemic signs of infection. She continues to have yellow thick drainage. 2/20; patient presents for follow-up. She continues to use Dakin's wet-to-dry dressings. She obtained her MRI. The results showed an abscess and she is scheduled to see her orthopedic surgeon on 2/23. She saw infectious disease 2/17 and her antibiotics were extended. She currently denies systemic signs of infection. 3/6; patient presents for follow-up. She had debridement and irrigation of her foot on 03/10/2021 due to abscess noted on MRI. She was started on IV ceftriaxone  and oral Flagyl . She has no  issues or complaints today. She has been using iodoform packing to the tunnel and Dakin's wet-to-dry to the opening. 03/26/2021: She continues on IV ceftriaxone  and oral metronidazole . She has follow-up with infectious disease tomorrow. No significant issues or complaints today. Her mother continues to help her with her wound dressing, using iodoform packing strips into the tunnel and Dakin's to the open portion of the wound. 3/20; patient presents for follow-up. She continues to be on IV ceftriaxone  in oral metronidazole . She has been using iodoform to the tunnel and Dakin's wet-to- dry to the open wound. She denies signs of infection. 3/27; patient presents for follow-up. She no longer has a PICC line. She has been using iodoform to the tunnel and Dakin's wet-to-dry to the open wound. She reports improvement in wound healing. She denies signs of infection. 4/3; patient presents for follow-up. She states she has been using Hydrofera Blue to the open wound and iodoform packing to the tunnel without any issues. She denies signs of infection. 4/18; patient presents for follow-up. She saw infectious disease on 4/11. She has finished her oral antibiotics and completed a total of 6 weeks of antibiotics (this includes IV as well). No further antibiotics needed. She has been using Hydrofera Blue and iodoform packing. She states that the tunneled area has come in and the iodoform is not staying in place anymore. She has no issues or complaints today. She denies signs of infection. 4/24; patient presents for follow-up. She saw Dr. Theresa, plastic surgery to discuss potential skin graft/substitute placement. At this time he thinks that the skin graft would likely not take. He is in agreement with trying a wound VAC. Patient has been using Hydrofera Blue dressing changes with no issues. She denies signs of infection. She reports improvement in wound healing. 5/1; patient presents for follow-up. Unfortunately  patient did not have insurance when we ran for the pico. There is an assistance program and we are trying to get this accommodated for the patient. In the meantime she has been using Hydrofera Blue without any issues. She denies signs of infection. MARIN, WISNER ([id number]) [id number] Page 3 of 11 5/8; patient presents for follow-up. We have not heard back if pico is covered by her insurance.  She has been using collagen to the wound bed over the past week. She denies signs of infection. 5/18; patient presents for follow-up. She has been using collagen to the wound bed without issues. Again we have not heard if pico is covered by her insurance. She has no issues or complaints today. 5/23; patient presents for follow-up. She has been using collagen to the wound bed. She has no issues or complaints today. She obtained the wound VAC from [facility] and brought this in today. She denies signs of infection. 6/1; patient presents for follow-up. She has been using the wound VAC for the past week. She has had this changed twice since she was last here. She reports more maceration to the periwound. She denies signs of infection. 6/7; right TMA site. There are 2 wounds 0 separated by a bridge of healed tissue. The more lateral area has undermining. Both areas have healthy looking granulation at the base but relative the size of the wound is fairly deep. There is no exposed bone no evidence of infection. Her wound VAC was put on hold last week because of surrounding skin maceration she has been using collagen this week. She has a modified shoe 6/12; patient presents for follow-up. Last week the wound VAC was reinitiated. She had no issues with the wound VAC itself. Today she has maceration again noted to the surrounding skin. She denies signs of infection. 6/27; patient presents for follow-up. She has been using Medihoney to the wound bed. We took a break from the wound VAC because the  periwound was macerated. She still has some areas of maceration to the distal foot where there is a callus. She currently denies signs of infection. 7/11; patient presents for follow-up. She has been using Medihoney to the wound bed. She has developed some increased warmth and erythema to the lateral aspect of the right foot. She states this is occurred over the past week and there is increased pain. No drainage noted. 7/17; patient presents for follow-up. She has been using Medihoney to the wound bed. She completed her course of Bactrim . She reports improvement in symptoms. 7/24; Patient presents for follow up. She has been using Medihoney to the wound bed without issues. She completed another course of Bactrim . She reports improvement in her symptoms but still has some mild tenderness to the medial aspect of the foot. 7/31; Patient presents for follow-up. She has been using Medihoney and Dakin's to the wound bed. She denies signs of infection. 8/14; patient presents for follow-up. She has been using Dakin's wet-to-dry packing strips to the right medial aspect of the amputation site and Medihoney to the anterior site. She has no issues or complaints today. She has started physical therapy. She denies signs of infection. 8/29; patient presents for follow-up. She has been using Dakin's wet-to-dry packing strips to the right medial aspect of the amputation site however this is becoming more difficult to place. She did report that she had increased redness and swelling to that site and developed some drainage. It has resolved. She continues with physical therapy. 9/8; patient presents for follow-up. We have been using silver alginate to the tunneled area. She completed her course of antibiotics. She reports no pain, increased swelling or erythema. 9/21; patient presents for follow-up. She has been using gentamicin  to the tunneled area. She has no issues or complaints today. She denies signs of  infection. 10/2; patient presents for follow-up. She is scheduled to have her CT scan on 10/9. She currently  denies signs of infection. She denies increased warmth, erythema or purulent drainage from the wound bed. She has been using Dakin's wet-to-dry dressings. 10/12; patient presents for follow-up. She had her CT scan on 10/9 that showed A small irregular rim-enhancing fluid pocket communicating to the overlying soft tissues of the sinus tract compatible with a small abscess. Currently she denies systemic signs of infection. She has been doing Dakin's wet-to-dry packing strips but it is hard for her to pack into the narrow opening. 10/30; patient presents for follow-up. Since last clinic visit she has had OR debridement of her Right foot by Dr. Barton Due to abscess noted on CT. She has been using iodoform packing. 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 She will complete 2 weeks of this and continue with oral amoxicillin  for the next 6 to 12 months. She follows with Dr. Comer for this. She currently denies signs of infection. 11/13; patient presents for follow-up. Patient has been using silver alginate with gentamicin  to the wound bed. She has no issues or complaints today she. She reports improvement in wound healing. 12/4; patient presents for follow-up. She has been using silver alginate and gentamicin  to the wound bed. She has no issues or complaints today. 12/8; patient presents for follow-up. She has been using silver alginate with gentamicin  to the wound bed. She presents for her first cast placement. She will be back early next week for her obligatory cast change. 12/12; patient presents for follow-up. We have been using collagen with antibiotic ointment to the wound bed under a total contact cast. She has tolerated this well. She is improved in wound healing. 12/18; patient presents for follow-up. We have been using collagen with antibiotic  ointment under the total contact cast. She has no complaints today. 1/2; The cast was placed at last clinic visit however patient missed her follow-up and this was taken off last week at a nurse visit. Patient has been using packing strips to the medial narrowed wound. She has noted no drainage. 1/22; patient presents for follow-up. She has been keeping the area covered. She reports no drainage. She has had no issues to the previous wound site. She denies any signs of infection including increased warmth, erythema or purulent drainage. 08/08/2022 Ms. Codie Hainer is a 43 year old female with a past medical history of insulin -dependent uncontrolled type 2 diabetes with last hemoglobin A1c of 8.8, chronic osteomyelitis of the right foot status post transmetatarsal amputation on 12/18/2020 that has been previously treated for surgical dehiscence of this site in our clinic. She presents today 6 months after discharge from our clinic due to healed right foot wound with now reopening of the previous surgical site. She reports chronic pain to the site however denies purulent drainage, increased warmth or erythema. She noticed the wound opening about 1 month ago. She has been doing physical therapy without significant issues. She has inserts to her shoe however she has been advised to follow-up with [facility] for new custom inserts by her orthopedic surgeon now that she has a new wound. She states she will try and do this. She has been using antibiotic ointment to the wound bed. 8/1; patient presents for follow-up. She has been taking her antibiotics without issues. She has been using Dakin's wet-to-dry dressings to the wound bed. The wound is smaller. She denies systemic signs of infection. 8/13; patient presents for follow-up. She had ABIs completed that were normal. On the right it was 1.02 and  on the left was 0.99. She has been using silver alginate to the wound bed with gentamicin  ointment. She states it  is hard to pack the wound bed. She currently denies signs of infection. 8/29; patient presents for follow-up. She has been using collagen with antibiotic ointment to the wound bed. Wound is stable. She denies signs of infection. METHA, KOLASA ([id number]) [id number] Page 4 of 11 9/5; patient presents for follow-up. She has been using silver alginate to the wound bed. Wound is slightly wider. She denies signs of infection. 9/12; patient presents for follow-up. She has been using silver alginate to the wound bed. Wound is slightly smaller today. 9/19; right foot in the setting of a TMA over the first metatarsal head. She has thick callus around this area small fissured wound which actually looks like it is attempting to close. She has a offloading shoe I am not sure how much activity she is in. I did not have a lot of time to see her today because of transportation 9/26; right foot in the setting of a previous TMA. The wound is over the plantar first metatarsal head. This is a fissured wound with thick callus around the margins. It is small in terms of overall surface area but has a depth of about 0.6 cm there was no palpable bone. She has a history of chronic osteomyelitis although she has been treated for this I have reviewed CT scans from 2023 also an MRI from 10/26/2021. Neither imaging study showed osteomyelitis in this area although the CT scan suggested osteomyelitis in the fourth metatarsal head. There has been problems with abscesses communicating with the wound although there does not appear to be purulent drainage currently 10/3; right foot at the TMA site. Small probing wound. I changed her to Iodosorb with gauze last time. May be some improvement. She has a depth of about 0.6 cm but there is no palpable bone. She has been previously treated for chronic osteomyelitis in this area. 10/17; this is a patient with a probing wound at the right TMA site. Underlying  osteomyelitis previously treated earlier this year. I been using Iodosorb to try and help constrict this wound however today she comes in with a larger circumference a larger tunnel and this probably sits right on top of bone. Also worrisome is there was a streaking dark area just caudal to the wound on the dorsal surface of the foot. The patient is complaining of more pain but no fever. 10/24; culture of this area [bone scraping] revealed Acinetobacter and 2 coag negative staph. I am going to give her Septra  DS 1 p.o. twice daily for 2 weeks. I think it would be simplistic to call this a bone culture but it certainly was a deep culture. An x-ray was done but that has not been reported yet. I had wanted to get lab work including a CBC with differential ESR and C-reactive protein but I am not actually sure that got done last week. We have been using gent and silver alginate. 11/7; patient is still taking trimethoprim /sulfamethoxazole  for the assessment of bacterial identified last week. Lab work revealed a C-reactive protein of 4.1 which is elevated from 1.7 but not dramatic. White count was normal at 8.4 x-ray of the right foot revealed minimal new lucency and cortical erosion within the adjacent distal plantar aspect of the metatarsals concerning for acute osteomyelitis early. 11/19; the patient has completed her Septra  DS which I gave to her for 2 weeks. She has  her MRI tomorrow but then we will have the obligatory 5 to 10- day/workday wait to get a report. I am going to resume her Septra  DS for a further 2 weeks. Then decide on additional antibiotics, infectious disease etc. 12/5; the patient has completed the Septra  DS for the originally cultured as Enterobacter on a bone scraping. Her MRI somewhat surprisingly showed status post amputation of the 1st through 5th metatarsal base but no evidence of osteomyelitis. Surrounding skin thickening with mild edema could represent cellulitis. Previous x-ray  showed the suggestion of bone erosion although this was not verified by her MRI. 12/9; patient comes in today for follow-up. She tolerated the total contact cast well. The areas on the right TMA laterally. Medially she has undermining. What I can see of the granulation looks healthy. We are using Prisma and we placed the total contact cast again last week 12/17;. She has a area on the right TMA medially. This seems to have contracted quite a bit this week. There is still 1 cm of depth. No palpable bone. No purulent drainage. Again MRI did not suggest underlying osteomyelitis. She has completed the antibiotics I gave her for S Enterobacter. We put her back in a total contact cast last week using Prisma as the primary dressing 12/23; right TMA medially. This is a weightbearing surface we now have her in a total contact cast. We have been using Prisma and gentamicin . The gentamicin  to target S adeno bacteria. She is completed a course of Septra . MRI did not underlying osteomyelitis. 01/13/2023: The length and depth of the wound, including undermining, has decreased a bit since last week. She has some slough accumulation on the surface as well as extensive callus buildup. 01/20/2023; patient presents for follow-up. We have been using Prisma with gentamicin  ointment under a total contact cast. She has no issues or complaints today. She denies signs of infection. 01/27/2023; patient presents for follow-up. We have been using Prisma with gentamicin  ointment under the total contact cast. She has no issues or complaints. She denies signs of infection. Wound is smaller. Electronic Signature(s) Signed: 01/27/2023 5:12:23 PM By: Rosan Raisin DO Entered By: Rosan Raisin on 01/27/2023 15:18:11 -------------------------------------------------------------------------------- Physical Exam Details Patient Name: Date of Service: [name], [name] 01/27/2023 1:15 PM Medical Record Number: [id number] Patient  Account Number: [id number] Date of Birth/Sex: Treating RN: 10/24/1980 (43 y.o. F) Primary Care Provider: Celestia Browning Other Clinician: Referring Provider: Treating Provider/Extender: Rosan Raisin Celestia Browning Devra in Treatment: 24 Constitutional respirations regular, non-labored and within target range for patient.. Cardiovascular Greener, Kimbrely L ([id number]) [id number] Page 5 of 11 2+ dorsalis pedis/posterior tibialis pulses. Psychiatric pleasant and cooperative. Notes Open wound to previous transmetatarsal tarsal amputation dehised site. Granulation tissue with slough accumulation noted at the wound bed. Undermining circumferentially. No signs of surrounding infection including increased warmth, erythema or purulent drainage. Electronic Signature(s) Signed: 01/27/2023 5:12:23 PM By: Rosan Raisin DO Entered By: Rosan Raisin on 01/27/2023 15:19:48 -------------------------------------------------------------------------------- Physician Orders Details Patient Name: Date of Service: [name], [name] 01/27/2023 1:15 PM Medical Record Number: [id number] Patient Account Number: [id number] Date of Birth/Sex: Treating RN: 1980/01/20 (43 y.o. JEANELL Drury Nestle Primary Care Provider: Celestia Browning Other Clinician: Referring Provider: Treating Provider/Extender: Rosan Raisin Celestia Browning Devra in Treatment: 24 The following information was scribed by: Drury Nestle The information was scribed for: Rosan Raisin Verbal / Phone Orders: No Diagnosis Coding ICD-10 Coding Code Description L97.512 Non-pressure chronic ulcer of other part of right  foot with fat layer exposed E11.621 Type 2 diabetes mellitus with foot ulcer M86.671 Other chronic osteomyelitis, right ankle and foot Follow-up Appointments ppointment in 1 week. - Dr. Rosan 02/03/23 at 1:15PM (already scheduled) Return A ppointment in 2 weeks. - Dr. Rosan  02/10/23 at 1:15PM (already scheduled) Return A Return appointment in 3 weeks. - Dr. Rosan (front office to schedule) Anesthetic (In clinic) Topical Lidocaine  5% applied to wound bed Bathing/ Shower/ Hygiene May shower with protection but do not get wound dressing(s) wet. Protect dressing(s) with water repellant cover (for example, large plastic bag) or a cast cover and may then take shower. Other Bathing/Shower/Hygiene Orders/Instructions: - When changing the dressing (every other day) you may get right foot wet. On days NOT changing the dressing.Keep wound/dressing dry Off-Loading Total Contact Cast to Right Lower Extremity - size 4 Wound Treatment Wound #4 - Amputation Site - Transmetatarsal Wound Laterality: Right Cleanser: Soap and Water 1 x Per Week/30 Days Discharge Instructions: May shower and wash wound with dial  antibacterial soap and water prior to dressing change. On days not changing the dressing, please keep wound/dressing dry Topical: Gentamicin  1 x Per Week/30 Days Discharge Instructions: As directed by physician Prim Dressing: Promogran Prisma Matrix, 4.34 (sq in) (silver collagen) 1 x Per Week/30 Days ary Discharge Instructions: Moisten collagen with saline or hydrogel Secondary Dressing: ABD Pad, 8x10 1 x Per Week/30 Days Discharge Instructions: Apply over primary dressing as directed. Secondary Dressing: Woven Gauze Sponge, Non-Sterile 4x4 in 1 x Per Week/30 Days Discharge Instructions: Apply over primary dressing as directed. RABAB, CURRINGTON ([id number]) [id number] Page 6 of 11 Secondary Dressing: Zetuvit Plus 4x8 in 1 x Per Week/30 Days Discharge Instructions: Apply over primary dressing as directed. Secured With: [employer], 4.5x3.1 (in/yd) 1 x Per Week/30 Days Discharge Instructions: Secure with Kerlix as directed. Secured With: 90M Medipore H Soft Cloth Surgical T ape, 4 x 10 (in/yd) 1 x Per Week/30 Days Discharge  Instructions: Secure with tape as directed. Secured With: total contact cast size 4 1 x Per Week/30 Days Discharge Instructions: last layer applied by provider. Electronic Signature(s) Signed: 01/27/2023 5:12:23 PM By: Rosan Raisin DO Entered By: Rosan Raisin on 01/27/2023 15:20:03 -------------------------------------------------------------------------------- Problem List Details Patient Name: Date of Service: [name], [name] 01/27/2023 1:15 PM Medical Record Number: [id number] Patient Account Number: [id number] Date of Birth/Sex: Treating RN: [birth year] (43 y.o. JEANELL Drury Nestle Primary Care Provider: Celestia Browning Other Clinician: Referring Provider: Treating Provider/Extender: Rosan Raisin Celestia Browning Devra in Treatment: 24 Active Problems ICD-10 Encounter Code Description Active Date MDM Diagnosis L97.512 Non-pressure chronic ulcer of other part of right foot with fat layer exposed 08/08/2022 No Yes E11.621 Type 2 diabetes mellitus with foot ulcer 08/08/2022 No Yes M86.671 Other chronic osteomyelitis, right ankle and foot 08/08/2022 No Yes Inactive Problems Resolved Problems Electronic Signature(s) Signed: 01/27/2023 5:12:23 PM By: Rosan Raisin DO Entered By: Rosan Raisin on 01/27/2023 15:17:16 -------------------------------------------------------------------------------- Progress Note Details Patient Name: Date of Service: [name], [name] 01/27/2023 1:15 PM Medical Record Number: [id number] Patient Account Number: [id number] Date of Birth/Sex: Treating RN: 1980/08/31 (43 y.o. Everlene Cunning, Melina L ([id number]) [id number] Page 7 of 11 Primary Care Provider: Celestia Browning Other Clinician: Referring Provider: Treating Provider/Extender: Rosan Raisin Celestia Browning Devra in Treatment: 24 Subjective Chief Complaint Information obtained from Patient 01/22/2021; Osteomyelitis of the right foot status post  transmetatarsal amputation with surgical site dehiscence 08/08/2022; reopening of wound to the right foot with a history  of transmetatarsal amputation with previous difficulty healing surgical site History of Present Illness (HPI) Admission 01/22/2021 Ms. Ahuva Poynor is a 43 year old female with a past medical history of insulin -dependent uncontrolled type 2 diabetes with last hemoglobin A1c of 13.5, osteomyelitis of the right foot status post transmetatarsal amputation on 12/18/2020 that presents to the clinic for right foot wound. She has had dehiscence of the surgical site. She is currently using wet-to-dry dressings. She has a PICC line and receiving IV ceftriaxone  daily for her osteomyelitis. There is an end date of 01/27/2021. She is also taking oral metronidazole . She currently denies systemic signs of infection. 1/19; patient presents for follow-up. She was diagnosed with a DVT to the right lower extremity 2 days ago. She is on Eliquis  now. She is scheduled to see her infectious disease doctor tomorrow. She has been using Dakin's wet-to-dry dressings. She denies systemic signs of infection. 1/26; patient presents for follow-up. She saw infectious disease on 1/21 started on Augmentin . Her PICC line and IV ceftriaxone  was discontinued. Patient reports stability to her wound. She has been using Dakin's wet-to-dry dressings. She currently denies systemic signs of infection. 2/3; patient presents for follow-up. She continues to use Dakin's wet-to-dry dressings to the wound bed. She saw Dr. Anitra with infectious disease yesterday and is continuing Augmentin . T entative end date is 2/16. Patient reports following up with orthopedics. She states there is no further plan from them. She currently denies systemic signs of infection. 2/10; patient presents for follow-up. She continues to use Dakin's wet to dry dressings. She is scheduled to have her MRI done on 2/14. She states that she had pain to the  debridement site from last clinic visit and declines debridement today. She denies systemic signs of infection. She continues to have yellow thick drainage. 2/20; patient presents for follow-up. She continues to use Dakin's wet-to-dry dressings. She obtained her MRI. The results showed an abscess and she is scheduled to see her orthopedic surgeon on 2/23. She saw infectious disease 2/17 and her antibiotics were extended. She currently denies systemic signs of infection. 3/6; patient presents for follow-up. She had debridement and irrigation of her foot on 03/10/2021 due to abscess noted on MRI. She was started on IV ceftriaxone  and oral Flagyl . She has no issues or complaints today. She has been using iodoform packing to the tunnel and Dakin's wet-to-dry to the opening. 03/26/2021: She continues on IV ceftriaxone  and oral metronidazole . She has follow-up with infectious disease tomorrow. No significant issues or complaints today. Her mother continues to help her with her wound dressing, using iodoform packing strips into the tunnel and Dakin's to the open portion of the wound. 3/20; patient presents for follow-up. She continues to be on IV ceftriaxone  in oral metronidazole . She has been using iodoform to the tunnel and Dakin's wet-to- dry to the open wound. She denies signs of infection. 3/27; patient presents for follow-up. She no longer has a PICC line. She has been using iodoform to the tunnel and Dakin's wet-to-dry to the open wound. She reports improvement in wound healing. She denies signs of infection. 4/3; patient presents for follow-up. She states she has been using Hydrofera Blue to the open wound and iodoform packing to the tunnel without any issues. She denies signs of infection. 4/18; patient presents for follow-up. She saw infectious disease on 4/11. She has finished her oral antibiotics and completed a total of 6 weeks of antibiotics (this includes IV as well). No further antibiotics  needed. She has  been using Hydrofera Blue and iodoform packing. She states that the tunneled area has come in and the iodoform is not staying in place anymore. She has no issues or complaints today. She denies signs of infection. 4/24; patient presents for follow-up. She saw Dr. Theresa, plastic surgery to discuss potential skin graft/substitute placement. At this time he thinks that the skin graft would likely not take. He is in agreement with trying a wound VAC. Patient has been using Hydrofera Blue dressing changes with no issues. She denies signs of infection. She reports improvement in wound healing. 5/1; patient presents for follow-up. Unfortunately patient did not have insurance when we ran for the pico. There is an assistance program and we are trying to get this accommodated for the patient. In the meantime she has been using Hydrofera Blue without any issues. She denies signs of infection. 5/8; patient presents for follow-up. We have not heard back if pico is covered by her insurance. She has been using collagen to the wound bed over the past week. She denies signs of infection. 5/18; patient presents for follow-up. She has been using collagen to the wound bed without issues. Again we have not heard if pico is covered by her insurance. She has no issues or complaints today. 5/23; patient presents for follow-up. She has been using collagen to the wound bed. She has no issues or complaints today. She obtained the wound VAC from [facility] and brought this in today. She denies signs of infection. 6/1; patient presents for follow-up. She has been using the wound VAC for the past week. She has had this changed twice since she was last here. She reports more maceration to the periwound. She denies signs of infection. 6/7; right TMA site. There are 2 wounds 0 separated by a bridge of healed tissue. The more lateral area has undermining. Both areas have healthy looking granulation at the base but relative  the size of the wound is fairly deep. There is no exposed bone no evidence of infection. Her wound VAC was put on hold last week because of surrounding skin maceration she has been using collagen this week. She has a modified shoe 6/12; patient presents for follow-up. Last week the wound VAC was reinitiated. She had no issues with the wound VAC itself. Today she has maceration again noted to the surrounding skin. She denies signs of infection. 6/27; patient presents for follow-up. She has been using Medihoney to the wound bed. We took a break from the wound VAC because the periwound was macerated. She still has some areas of maceration to the distal foot where there is a callus. She currently denies signs of infection. 7/11; patient presents for follow-up. She has been using Medihoney to the wound bed. She has developed some increased warmth and erythema to the lateral aspect of the right foot. She states this is occurred over the past week and there is increased pain. No drainage noted. 7/17; patient presents for follow-up. She has been using Medihoney to the wound bed. She completed her course of Bactrim . She reports improvement in ELINOR, KLEINE ([id number]) [id number] Page 8 of 11 symptoms. 7/24; Patient presents for follow up. She has been using Medihoney to the wound bed without issues. She completed another course of Bactrim . She reports improvement in her symptoms but still has some mild tenderness to the medial aspect of the foot. 7/31; Patient presents for follow-up. She has been using Medihoney and Dakin's to the wound bed. She denies signs of  infection. 8/14; patient presents for follow-up. She has been using Dakin's wet-to-dry packing strips to the right medial aspect of the amputation site and Medihoney to the anterior site. She has no issues or complaints today. She has started physical therapy. She denies signs of infection. 8/29; patient presents for  follow-up. She has been using Dakin's wet-to-dry packing strips to the right medial aspect of the amputation site however this is becoming more difficult to place. She did report that she had increased redness and swelling to that site and developed some drainage. It has resolved. She continues with physical therapy. 9/8; patient presents for follow-up. We have been using silver alginate to the tunneled area. She completed her course of antibiotics. She reports no pain, increased swelling or erythema. 9/21; patient presents for follow-up. She has been using gentamicin  to the tunneled area. She has no issues or complaints today. She denies signs of infection. 10/2; patient presents for follow-up. She is scheduled to have her CT scan on 10/9. She currently denies signs of infection. She denies increased warmth, erythema or purulent drainage from the wound bed. She has been using Dakin's wet-to-dry dressings. 10/12; patient presents for follow-up. She had her CT scan on 10/9 that showed A small irregular rim-enhancing fluid pocket communicating to the overlying soft tissues of the sinus tract compatible with a small abscess. Currently she denies systemic signs of infection. She has been doing Dakin's wet-to-dry packing strips but it is hard for her to pack into the narrow opening. 10/30; patient presents for follow-up. Since last clinic visit she has had OR debridement of her Right foot by Dr. Barton Due to abscess noted on CT. She has been using iodoform packing. 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 She will complete 2 weeks of this and continue with oral amoxicillin  for the next 6 to 12 months. She follows with Dr. Comer for this. She currently denies signs of infection. 11/13; patient presents for follow-up. Patient has been using silver alginate with gentamicin  to the wound bed. She has no issues or complaints today she. She reports improvement in wound  healing. 12/4; patient presents for follow-up. She has been using silver alginate and gentamicin  to the wound bed. She has no issues or complaints today. 12/8; patient presents for follow-up. She has been using silver alginate with gentamicin  to the wound bed. She presents for her first cast placement. She will be back early next week for her obligatory cast change. 12/12; patient presents for follow-up. We have been using collagen with antibiotic ointment to the wound bed under a total contact cast. She has tolerated this well. She is improved in wound healing. 12/18; patient presents for follow-up. We have been using collagen with antibiotic ointment under the total contact cast. She has no complaints today. 1/2; The cast was placed at last clinic visit however patient missed her follow-up and this was taken off last week at a nurse visit. Patient has been using packing strips to the medial narrowed wound. She has noted no drainage. 1/22; patient presents for follow-up. She has been keeping the area covered. She reports no drainage. She has had no issues to the previous wound site. She denies any signs of infection including increased warmth, erythema or purulent drainage. 08/08/2022 Ms. Thedora Rings is a 43 year old female with a past medical history of insulin -dependent uncontrolled type 2 diabetes with last hemoglobin A1c of 8.8, chronic osteomyelitis of the right foot status post transmetatarsal amputation  on 12/18/2020 that has been previously treated for surgical dehiscence of this site in our clinic. She presents today 6 months after discharge from our clinic due to healed right foot wound with now reopening of the previous surgical site. She reports chronic pain to the site however denies purulent drainage, increased warmth or erythema. She noticed the wound opening about 1 month ago. She has been doing physical therapy without significant issues. She has inserts to her shoe however she has  been advised to follow-up with [employer] for new custom inserts by her orthopedic surgeon now that she has a new wound. She states she will try and do this. She has been using antibiotic ointment to the wound bed. 8/1; patient presents for follow-up. She has been taking her antibiotics without issues. She has been using Dakin's wet-to-dry dressings to the wound bed. The wound is smaller. She denies systemic signs of infection. 8/13; patient presents for follow-up. She had ABIs completed that were normal. On the right it was 1.02 and on the left was 0.99. She has been using silver alginate to the wound bed with gentamicin  ointment. She states it is hard to pack the wound bed. She currently denies signs of infection. 8/29; patient presents for follow-up. She has been using collagen with antibiotic ointment to the wound bed. Wound is stable. She denies signs of infection. 9/5; patient presents for follow-up. She has been using silver alginate to the wound bed. Wound is slightly wider. She denies signs of infection. 9/12; patient presents for follow-up. She has been using silver alginate to the wound bed. Wound is slightly smaller today. 9/19; right foot in the setting of a TMA over the first metatarsal head. She has thick callus around this area small fissured wound which actually looks like it is attempting to close. She has a offloading shoe I am not sure how much activity she is in. I did not have a lot of time to see her today because of transportation 9/26; right foot in the setting of a previous TMA. The wound is over the plantar first metatarsal head. This is a fissured wound with thick callus around the margins. It is small in terms of overall surface area but has a depth of about 0.6 cm there was no palpable bone. She has a history of chronic osteomyelitis although she has been treated for this I have reviewed CT scans from 2023 also an MRI from 10/26/2021. Neither imaging study showed  osteomyelitis in this area although the CT scan suggested osteomyelitis in the fourth metatarsal head. There has been problems with abscesses communicating with the wound although there does not appear to be purulent drainage currently 10/3; right foot at the TMA site. Small probing wound. I changed her to Iodosorb with gauze last time. May be some improvement. She has a depth of about 0.6 cm but there is no palpable bone. She has been previously treated for chronic osteomyelitis in this area. 10/17; this is a patient with a probing wound at the right TMA site. Underlying osteomyelitis previously treated earlier this year. I been using Iodosorb to try and help constrict this wound however today she comes in with a larger circumference a larger tunnel and this probably sits right on top of bone. Also worrisome is there was a streaking dark area just caudal to the wound on the dorsal surface of the foot. The patient is complaining of more pain but no fever. 10/24; culture of this area [bone scraping] revealed Acinetobacter  and 2 coag negative staph. I am going to give her Septra  DS 1 p.o. twice daily for 2 weeks. I think it would be simplistic to call this a bone culture but it certainly was a deep culture. An x-ray was done but that has not been reported yet. I had wanted to ABBIGAIL, ANSTEY ([id number]) [phone].pdf Page 9 of 11 get lab work including a CBC with differential ESR and C-reactive protein but I am not actually sure that got done last week. We have been using gent and silver alginate. 11/7; patient is still taking trimethoprim /sulfamethoxazole  for the assessment of bacterial identified last week. Lab work revealed a C-reactive protein of 4.1 which is elevated from 1.7 but not dramatic. White count was normal at 8.4 x-ray of the right foot revealed minimal new lucency and cortical erosion within the adjacent distal plantar aspect of the metatarsals concerning for  acute osteomyelitis early. 11/19; the patient has completed her Septra  DS which I gave to her for 2 weeks. She has her MRI tomorrow but then we will have the obligatory 5 to 10- day/workday wait to get a report. I am going to resume her Septra  DS for a further 2 weeks. Then decide on additional antibiotics, infectious disease etc. 12/5; the patient has completed the Septra  DS for the originally cultured as Enterobacter on a bone scraping. Her MRI somewhat surprisingly showed status post amputation of the 1st through 5th metatarsal base but no evidence of osteomyelitis. Surrounding skin thickening with mild edema could represent cellulitis. Previous x-ray showed the suggestion of bone erosion although this was not verified by her MRI. 12/9; patient comes in today for follow-up. She tolerated the total contact cast well. The areas on the right TMA laterally. Medially she has undermining. What I can see of the granulation looks healthy. We are using Prisma and we placed the total contact cast again last week 12/17;. She has a area on the right TMA medially. This seems to have contracted quite a bit this week. There is still 1 cm of depth. No palpable bone. No purulent drainage. Again MRI did not suggest underlying osteomyelitis. She has completed the antibiotics I gave her for S Enterobacter. We put her back in a total contact cast last week using Prisma as the primary dressing 12/23; right TMA medially. This is a weightbearing surface we now have her in a total contact cast. We have been using Prisma and gentamicin . The gentamicin  to target S adeno bacteria. She is completed a course of Septra . MRI did not underlying osteomyelitis. 01/13/2023: The length and depth of the wound, including undermining, has decreased a bit since last week. She has some slough accumulation on the surface as well as extensive callus buildup. 01/20/2023; patient presents for follow-up. We have been using Prisma with gentamicin   ointment under a total contact cast. She has no issues or complaints today. She denies signs of infection. 01/27/2023; patient presents for follow-up. We have been using Prisma with gentamicin  ointment under the total contact cast. She has no issues or complaints. She denies signs of infection. Wound is smaller. Objective Constitutional respirations regular, non-labored and within target range for patient.. Vitals Time Taken: 1:32 PM, Height: 69 in, Weight: 330 lbs, BMI: 48.7, Temperature: 98.3 F, Pulse: 88 bpm, Respiratory Rate: 18 breaths/min, Blood Pressure: 122/85 mmHg. Cardiovascular 2+ dorsalis pedis/posterior tibialis pulses. Psychiatric pleasant and cooperative. General Notes: Open wound to previous transmetatarsal tarsal amputation dehised site. Granulation tissue with slough accumulation noted at the wound  bed. Undermining circumferentially. No signs of surrounding infection including increased warmth, erythema or purulent drainage. Integumentary (Hair, Skin) Wound #4 status is Open. Original cause of wound was Shear/Friction. The date acquired was: 07/08/2022. The wound has been in treatment 24 weeks. The wound is located on the Right Amputation Site - Transmetatarsal. The wound measures 1.1cm length x 0.5cm width x 0.4cm depth; 0.432cm^2 area and 0.173cm^3 volume. There is Fat Layer (Subcutaneous Tissue) exposed. There is no tunneling or undermining noted. There is a large amount of serosanguineous drainage noted. The wound margin is thickened. There is large (67-100%) red, pink granulation within the wound bed. There is a small (1-33%) amount of necrotic tissue within the wound bed. The periwound skin appearance had no abnormalities noted for color. The periwound skin appearance exhibited: Callus, Maceration. The periwound skin appearance did not exhibit: Crepitus, Excoriation, Induration, Rash, Scarring, Dry/Scaly. Periwound temperature was noted as No Abnormality. The periwound has  tenderness on palpation. Assessment Active Problems ICD-10 Non-pressure chronic ulcer of other part of right foot with fat layer exposed Type 2 diabetes mellitus with foot ulcer Other chronic osteomyelitis, right ankle and foot Patient's wound has shown improvement in size) since last clinic visit. I debrided nonviable tissue. I recommended continuing the course with antibiotic ointment and collagen under the total contact cast. Follow-up in 1 week. ADAISHA, CAMPISE ([id number]) [id number] Page 10 of 11 Procedures Wound #4 Pre-procedure diagnosis of Wound #4 is a Diabetic Wound/Ulcer of the Lower Extremity located on the Right Amputation Site - Transmetatarsal .Severity of Tissue Pre Debridement is: Fat layer exposed. There was a Excisional Skin/Subcutaneous Tissue Debridement with a total area of 0.43 sq cm performed by Rosan Raisin, DO. With the following instrument(s): Curette to remove Viable and Non-Viable tissue/material. Material removed includes Subcutaneous Tissue and Slough and after achieving pain control using Lidocaine  4% Topical Solution. A time out was conducted at 14:05, prior to the start of the procedure. A Moderate amount of bleeding was controlled with Pressure. The procedure was tolerated well with a pain level of 0 throughout and a pain level of 0 following the procedure. Post Debridement Measurements: 1.1cm length x 0.5cm width x 0.4cm depth; 0.173cm^3 volume. Character of Wound/Ulcer Post Debridement is improved. Severity of Tissue Post Debridement is: Fat layer exposed. Post procedure Diagnosis Wound #4: Same as Pre-Procedure Pre-procedure diagnosis of Wound #4 is a Diabetic Wound/Ulcer of the Lower Extremity located on the Right Amputation Site - Transmetatarsal . There was a T Contact Cast Procedure by Rosan Raisin, DO. otal Post procedure Diagnosis Wound #4: Same as Pre-Procedure Notes: size 4 TCC. Plan Follow-up  Appointments: Return Appointment in 1 week. - Dr. Rosan 02/03/23 at 1:15PM (already scheduled) Return Appointment in 2 weeks. - Dr. Rosan 02/10/23 at 1:15PM (already scheduled) Return appointment in 3 weeks. - Dr. Rosan (front office to schedule) Anesthetic: (In clinic) Topical Lidocaine  5% applied to wound bed Bathing/ Shower/ Hygiene: May shower with protection but do not get wound dressing(s) wet. Protect dressing(s) with water repellant cover (for example, large plastic bag) or a cast cover and may then take shower. Other Bathing/Shower/Hygiene Orders/Instructions: - When changing the dressing (every other day) you may get right foot wet. On days NOT changing the dressing.Keep wound/dressing dry Off-Loading: T Contact Cast to Right Lower Extremity - size 4 otal WOUND #4: - Amputation Site - Transmetatarsal Wound Laterality: Right Cleanser: Soap and Water 1 x Per Week/30 Days Discharge Instructions: May shower and wash wound with dial  antibacterial soap  and water prior to dressing change. On days not changing the dressing, please keep wound/dressing dry Topical: Gentamicin  1 x Per Week/30 Days Discharge Instructions: As directed by physician Prim Dressing: Promogran Prisma Matrix, 4.34 (sq in) (silver collagen) 1 x Per Week/30 Days ary Discharge Instructions: Moisten collagen with saline or hydrogel Secondary Dressing: ABD Pad, 8x10 1 x Per Week/30 Days Discharge Instructions: Apply over primary dressing as directed. Secondary Dressing: Woven Gauze Sponge, Non-Sterile 4x4 in 1 x Per Week/30 Days Discharge Instructions: Apply over primary dressing as directed. Secondary Dressing: Zetuvit Plus 4x8 in 1 x Per Week/30 Days Discharge Instructions: Apply over primary dressing as directed. Secured With: [employer], 4.5x3.1 (in/yd) 1 x Per Week/30 Days Discharge Instructions: Secure with Kerlix as directed. Secured With: 22M Medipore H Soft Cloth Surgical T ape, 4 x 10 (in/yd) 1  x Per Week/30 Days Discharge Instructions: Secure with tape as directed. Secured With: total contact cast size 4 1 x Per Week/30 Days Discharge Instructions: last layer applied by provider. 1. In office sharp debridement 2. Collagen and antibiotic ointment 3. T contact cast placed in standard fashion to the right lower extremity otal 4. Follow-up in 1 week Electronic Signature(s) Signed: 01/27/2023 5:12:23 PM By: Rosan Raisin DO Entered By: Rosan Raisin on 01/27/2023 15:21:49 -------------------------------------------------------------------------------- Total Contact Cast Details Patient Name: Date of Service: DA LULLA IS, [name] 01/27/2023 1:15 PM Medical Record Number: [id number] Patient Account Number: [id number] Date of Birth/Sex: Treating RN: 11/19/1980 (43 y.o. JEANELL Drury Nestle Primary Care Provider: Celestia Browning Other Clinician: AYELEN, SCIORTINO ([id number]) [id number] Page 11 of 11 Referring Provider: Treating Provider/Extender: Rosan Raisin Celestia Browning Devra in Treatment: 24 T Contact Cast Applied for Wound Assessment: otal Wound #4 Right Amputation Site - Transmetatarsal Performed By: Physician Rosan Raisin, DO The following information was scribed by: Drury Nestle The information was scribed for: Rosan Raisin Post Procedure Diagnosis Same as Pre-procedure Notes size 4 TCC Electronic Signature(s) Signed: 01/27/2023 4:48:15 PM By: Drury Nestle RN, BSN Signed: 01/27/2023 5:12:23 PM By: Rosan Raisin DO Entered By: Drury Nestle on 01/27/2023 13:40:03 -------------------------------------------------------------------------------- SuperBill Details Patient Name: Date of Service: [name], [name] 01/27/2023 Medical Record Number: [id number] Patient Account Number: [id number] Date of Birth/Sex: Treating RN: 1980/08/02 (43 y.o. 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

## 2023-01-29 NOTE — Progress Notes (Signed)
 [name], [name] ([id number]) [id number] Page 1 of 8 Visit Report for 01/20/2023 Arrival Information Details Patient Name: Date of Service: [name], [name]. 01/20/2023 1:15 PM Medical Record Number: [id number] Patient Account Number: [id number] Date of Birth/Sex: Treating RN: 1980/12/22 (43 y.o. F) Primary Care Shallon Yaklin: Celestia Browning Other Clinician: Referring Tia Gelb: Treating Sonnia Strong/Extender: Rosan Harlene Celestia Browning Devra in Treatment: 23 Visit Information History Since Last Visit Added or deleted any medications: [name] Patient Arrived: Wheel Chair Any new allergies or adverse reactions: [name] Arrival Time: 13:11 Had a fall or experienced change in [name] Accompanied By: self activities of daily living that may affect Transfer Assistance: None risk of falls: Patient Identification Verified: Yes Signs or symptoms of abuse/neglect since last [name] Secondary Verification Process Completed: Yes visito Patient Requires Transmission-Based Precautions: [name] Hospitalized since last visit: [name] Patient Has Alerts: Yes Implantable device outside of the clinic [name] Patient Alerts: Patient on Blood Thinner excluding Eliquis  cellular tissue based products placed in the ABI R 1.02 (08/21/22) center ABI [name] 0.99 (08/21/22) since last visit: Has Dressing in Place as Prescribed: Yes Has Footwear/Offloading in Place as Yes Prescribed: Right: Removable Cast Walker/Walking Boot T Contact Cast otal Pain Present Now: Yes Electronic Signature(s) Signed: 01/27/2023 5:23:50 PM By: Wyn Iha Entered By: Wyn Iha on 01/20/2023 13:19:21 -------------------------------------------------------------------------------- Encounter Discharge Information Details Patient Name: Date of Service: [name], [name]. 01/20/2023 1:15 PM Medical Record Number: [id number] Patient Account Number: [id number] Date of Birth/Sex: Treating RN: 01/03/1981 (43 y.o. [name] Primary  Care Elex Mainwaring: Celestia Browning Other Clinician: Referring Kenan Moodie: Treating Tobin Cadiente/Extender: Rosan Harlene Celestia Browning Devra in Treatment: 23 Encounter Discharge Information Items Post Procedure Vitals Discharge Condition: Stable Temperature (F): 98.3 Ambulatory Status: Wheelchair Pulse (bpm): 97 Discharge Destination: Home Respiratory Rate (breaths/min): 18 Transportation: Private Auto Blood Pressure (mmHg): 110/71 Accompanied By: self Schedule Follow-up Appointment: Yes Clinical Summary of Care: Electronic Signature(s) Signed: 01/22/2023 5:28:22 PM By: Drury Nestle RN, BSN Entered By: Drury [name] on 01/20/2023 13:58:27 [name], [name] ([id number]) [id number] Page 2 of 8 -------------------------------------------------------------------------------- Lower Extremity Assessment Details Patient Name: Date of Service: [name], [name]. 01/20/2023 1:15 PM Medical Record Number: [id number] Patient Account Number: [id number] Date of Birth/Sex: Treating RN: 02-Jul-1980 (43 y.o. F) Primary Care Tyrell Seifer: Celestia Browning Other Clinician: Referring Chevy Virgo: Treating Harding Thomure/Extender: Rosan Harlene Celestia Browning Devra in Treatment: 23 Edema Assessment Assessed: [Left: [name]] [Right: [name]] Edema: [Left: Ye] [Right: s] Calf Left: Right: Point of Measurement: 31 cm From Medial Instep 49.4 cm Ankle Left: Right: Point of Measurement: 9 cm From Medial Instep 28.5 cm Vascular Assessment Extremity colors, hair growth, and conditions: Extremity Color: [Right:Hyperpigmented] Hair Growth on Extremity: [Right:[name]] Temperature of Extremity: [Right:Warm] Capillary Refill: [Right:< 3 seconds] Dependent Rubor: [Right:[name] Yes] Electronic Signature(s) Signed: 01/27/2023 5:23:50 PM By: Wyn Iha Entered By: Wyn Iha on 01/20/2023 13:35:28 -------------------------------------------------------------------------------- Multi Wound Chart  Details Patient Name: Date of Service: [name], [name]. 01/20/2023 1:15 PM Medical Record Number: [id number] Patient Account Number: [id number] Date of Birth/Sex: Treating RN: 07/06/1980 (43 y.o. F) Primary Care Colbe Viviano: Celestia Browning Other Clinician: Referring Andrika Peraza: Treating Walburga Hudman/Extender: Rosan Harlene Celestia Browning Devra in Treatment: 23 Vital Signs Height(in): 69 Pulse(bpm): 97 Weight(lbs): 330 Blood Pressure(mmHg): 110/71 Body Mass Index(BMI): 48.7 Temperature(F): 98.3 Respiratory Rate(breaths/min): 18 [4:Photos:] [N/A:N/A] Right Amputation Site - N/A N/A Wound Location: Transmetatarsal Shear/Friction N/A N/A Wounding Event: Diabetic Wound/Ulcer of the Lower N/A N/A Primary Etiology: Extremity Dehisced Wound N/A N/A Secondary Etiology:  Deep Vein Thrombosis, Hypertension, N/A N/A Comorbid History: Type II Diabetes, Osteomyelitis, Neuropathy 07/08/2022 N/A N/A Date Acquired: 77 N/A N/A Weeks of Treatment: Open N/A N/A Wound Status: [name] N/A N/A Wound Recurrence: 1.2x0.7x0.5 N/A N/A Measurements [name] x W x D (cm) 0.66 N/A N/A A (cm) : rea 0.33 N/A N/A Volume (cm) : -200.00% N/A N/A % Reduction in A rea: -87.50% N/A N/A % Reduction in Volume: Grade 3 N/A N/A Classification: Large N/A N/A Exudate A mount: Serosanguineous N/A N/A Exudate Type: red, brown N/A N/A Exudate Color: Thickened N/A N/A Wound Margin: Large (67-100%) N/A N/A Granulation A mount: Red, Pink N/A N/A Granulation Quality: Small (1-33%) N/A N/A Necrotic A mount: Fat Layer (Subcutaneous Tissue): Yes N/A N/A Exposed Structures: Fascia: [name] Tendon: [name] Muscle: [name] Joint: [name] Bone: [name] Small (1-33%) N/A N/A Epithelialization: Debridement - Excisional N/A N/A Debridement: Pre-procedure Verification/Time Out 13:50 N/A N/A Taken: Lidocaine  4% Topical Solution N/A N/A Pain Control: Callus, Subcutaneous, Slough N/A N/A Tissue Debrided: Skin/Subcutaneous Tissue N/A  N/A Level: 0.82 N/A N/A Debridement A (sq cm): rea Curette N/A N/A Instrument: Minimum N/A N/A Bleeding: Pressure N/A N/A Hemostasis A chieved: 0 N/A N/A Procedural Pain: 0 N/A N/A Post Procedural Pain: Procedure was tolerated well N/A N/A Debridement Treatment Response: 1.2x0.7x0.5 N/A N/A Post Debridement Measurements [name] x W x D (cm) 0.33 N/A N/A Post Debridement Volume: (cm) Callus: Yes N/A N/A Periwound Skin Texture: Excoriation: [name] Induration: [name] Crepitus: [name] Rash: [name] Scarring: [name] Maceration: Yes N/A N/A Periwound Skin Moisture: Dry/Scaly: [name] Atrophie Blanche: [name] N/A N/A Periwound Skin Color: Cyanosis: [name] Ecchymosis: [name] Erythema: [name] Hemosiderin Staining: [name] Mottled: [name] Pallor: [name] Rubor: [name] [name] Abnormality N/A N/A Temperature: Yes N/A N/A Tenderness on Palpation: Debridement N/A N/A Procedures Performed: T Contact Cast otal Treatment Notes Wound #4 (Amputation Site - Transmetatarsal) Wound Laterality: Right Cleanser Soap and Water Discharge Instruction: May shower and wash wound with dial  antibacterial soap and water prior to dressing change. On days not changing the dressing, please keep wound/dressing dry [name], [name] ([id number]) [id number] Page 4 of 8 Peri-Wound Care Topical Gentamicin  Discharge Instruction: As directed by physician Primary Dressing Promogran Prisma Matrix, 4.34 (sq in) (silver collagen) Discharge Instruction: Moisten collagen with saline or hydrogel Secondary Dressing ABD Pad, 8x10 Discharge Instruction: Apply over primary dressing as directed. Woven Gauze Sponge, Non-Sterile 4x4 in Discharge Instruction: Apply over primary dressing as directed. Zetuvit Plus 4x8 in Discharge Instruction: Apply over primary dressing as directed. Secured With [employer], 4.5x3.1 (in/yd) Discharge Instruction: Secure with Kerlix as directed. 107M Medipore H Soft Cloth Surgical T ape, 4 x 10  (in/yd) Discharge Instruction: Secure with tape as directed. total contact cast size 4 Discharge Instruction: last layer applied by Janika Jedlicka. Compression Wrap Compression Stockings Add-Ons Electronic Signature(s) Signed: 01/22/2023 5:26:35 PM By: Rosan Raisin DO Entered By: Rosan Raisin on 01/20/2023 16:08:09 -------------------------------------------------------------------------------- Multi-Disciplinary Care Plan Details Patient Name: Date of Service: [name], [name]. 01/20/2023 1:15 PM Medical Record Number: [id number] Patient Account Number: [id number] Date of Birth/Sex: Treating RN: May 31, 1980 (43 y.o. [name] Primary Care Chisom Aust: Celestia Browning Other Clinician: Referring Quy Lotts: Treating Ashantia Amaral/Extender: Rosan Raisin Celestia Browning Devra in Treatment: 23 Active Inactive Nutrition Nursing Diagnoses: Impaired glucose control: actual or potential Goals: Patient/caregiver agrees to and verbalizes understanding of need to obtain nutritional consultation Date Initiated: 08/08/2022 Date Inactivated: 12/31/2022 Target Resolution Date: 01/13/2024 Goal Status: Met Patient/caregiver will maintain therapeutic glucose control Date Initiated: 08/08/2022 Target Resolution Date: 01/13/2024  Goal Status: Active Interventions: Assess HgA1c results as ordered upon admission and as needed Provide education on elevated blood sugars and impact on wound healing [name], [name] ([id number]) [id number] Page 5 of 8 Provide education on nutrition Treatment Activities: Obtain HgA1c : 08/08/2022 Patient referred to Primary Care Physician for further nutritional evaluation : 08/08/2022 Notes: Pain, Acute or Chronic Nursing Diagnoses: Pain, acute or chronic: actual or potential Potential alteration in comfort, pain Goals: Patient will verbalize adequate pain control and receive pain control interventions during procedures as needed Date  Initiated: 08/08/2022 Date Inactivated: 12/31/2022 Target Resolution Date: 01/13/2024 Goal Status: Met Patient/caregiver will verbalize comfort level met Date Initiated: 08/08/2022 Target Resolution Date: 01/13/2024 Goal Status: Active Interventions: Complete pain assessment as per visit requirements Encourage patient to take pain medications as prescribed Provide education on pain management Treatment Activities: Administer pain control measures as ordered : 08/08/2022 Notes: Wound/Skin Impairment Nursing Diagnoses: Knowledge deficit related to ulceration/compromised skin integrity Goals: Patient/caregiver will verbalize understanding of skin care regimen Date Initiated: 08/08/2022 Date Inactivated: 12/31/2022 Target Resolution Date: 01/13/2024 Goal Status: Met Ulcer/skin breakdown will heal within 14 weeks Date Initiated: 08/08/2022 Target Resolution Date: 01/13/2024 Goal Status: Active Interventions: Assess patient/caregiver ability to perform ulcer/skin care regimen upon admission and as needed Assess ulceration(s) every visit Provide education on ulcer and skin care Treatment Activities: Skin care regimen initiated : 08/08/2022 Topical wound management initiated : 08/08/2022 Notes: Electronic Signature(s) Signed: 01/22/2023 5:28:22 PM By: Drury Nestle RN, BSN Entered By: Drury [name] on 01/20/2023 13:49:49 -------------------------------------------------------------------------------- Pain Assessment Details Patient Name: Date of Service: [name], [name]. 01/20/2023 1:15 PM Medical Record Number: [id number] Patient Account Number: [id number] Date of Birth/Sex: Treating RN: 12/02/1980 (43 y.o. F) Primary Care Mckell Riecke: Celestia Browning Other Clinician: Referring Kallee Nam: Treating Witten Certain/Extender: Rosan Harlene Celestia Browning Devra in Treatment: [address], [name] ([id number]) [id number] Page 6 of 8 Active Problems Location of Pain  Severity and Description of Pain Patient Has Paino Yes Site Locations Pain Location: Pain in Ulcers Rate the pain. Current Pain Level: 4 Pain Management and Medication Current Pain Management: Electronic Signature(s) Signed: 01/27/2023 5:23:50 PM By: Wyn Iha Entered By: Wyn Iha on 01/20/2023 13:19:38 -------------------------------------------------------------------------------- Patient/Caregiver Education Details Patient Name: Date of Service: [name], [name]. 1/6/2025andnbsp1:15 PM Medical Record Number: [id number] Patient Account Number: [id number] Date of Birth/Gender: Treating RN: 07-31-1980 (43 y.o. [name] Primary Care Physician: Celestia Browning Other Clinician: Referring Physician: Treating Physician/Extender: Rosan Harlene Celestia Browning Devra in Treatment: 23 Education Assessment Education Provided To: Patient Education Topics Provided Wound/Skin Impairment: Handouts: Caring for Your Ulcer Methods: Explain/Verbal Responses: Reinforcements needed Electronic Signature(s) Signed: 01/22/2023 5:28:22 PM By: Drury Nestle RN, BSN Entered By: Drury [name] on 01/20/2023 13:50:12 Falin, [name] ([id number]) [id number] Page 7 of 8 -------------------------------------------------------------------------------- Wound Assessment Details Patient Name: Date of Service: [name], [name]. 01/20/2023 1:15 PM Medical Record Number: [id number] Patient Account Number: [id number] Date of Birth/Sex: Treating RN: 24-Jun-1980 (43 y.o. F) Primary Care Delroy Ordway: Celestia Browning Other Clinician: Referring Vergil Burby: Treating Kashayla Ungerer/Extender: Rosan Harlene Celestia Browning Devra in Treatment: 23 Wound Status Wound Number: 4 Primary Diabetic Wound/Ulcer of the Lower Extremity Etiology: Wound Location: Right Amputation Site - Transmetatarsal Secondary Dehisced Wound Wounding Event: Shear/Friction Etiology: Date Acquired:  07/08/2022 Wound Status: Open Weeks Of Treatment: 23 Comorbid Deep Vein Thrombosis, Hypertension, Type II Diabetes, Clustered Wound: [name] History: Osteomyelitis, Neuropathy Photos Wound Measurements Length: (cm) 1.2 Width: (cm) 0.7 Depth: (cm) 0.5 Area: (cm) 0.66  Volume: (cm) 0.33 % Reduction in Area: -200% % Reduction in Volume: -87.5% Epithelialization: Small (1-33%) Tunneling: [name] Undermining: [name] Wound Description Classification: Grade 3 Wound Margin: Thickened Exudate Amount: Large Exudate Type: Serosanguineous Exudate Color: red, brown Foul Odor After Cleansing: [name] Slough/Fibrino Yes Wound Bed Granulation Amount: Large (67-100%) Exposed Structure Granulation Quality: Red, Pink Fascia Exposed: [name] Necrotic Amount: Small (1-33%) Fat Layer (Subcutaneous Tissue) Exposed: Yes Tendon Exposed: [name] Muscle Exposed: [name] Joint Exposed: [name] Bone Exposed: [name] Periwound Skin Texture Texture Color [name] Abnormalities Noted: [name] [name] Abnormalities Noted: Yes Callus: Yes Temperature / Pain Crepitus: [name] Temperature: [name] Abnormality Excoriation: [name] Tenderness on Palpation: Yes Induration: [name] Rash: [name] Scarring: [name] Moisture [name] Abnormalities Noted: [name] Dry / Scaly: [name] Maceration: Yes Electronic Signature(s) Signed: 01/27/2023 5:23:50 PM By: Wyn Iha Entered By: Wyn Iha on 01/20/2023 13:39:47 [name], [name] ([id number]) [id number] Page 8 of 8 -------------------------------------------------------------------------------- Vitals Details Patient Name: Date of Service: [name], [name]. 01/20/2023 1:15 PM Medical Record Number: [id number] Patient Account Number: [id number] Date of Birth/Sex: Treating RN: [birth year] (43 y.o. F) Primary Care Remus Hagedorn: Celestia Browning Other Clinician: Referring Reshard Guillet: Treating Shamir Tuzzolino/Extender: Rosan Harlene Celestia Browning Devra in Treatment: 23 Vital Signs Time Taken: 13:36 Temperature (F): 98.3 Height  (in): 69 Pulse (bpm): 97 Weight (lbs): 330 Respiratory Rate (breaths/min): 18 Body Mass Index (BMI): 48.7 Blood Pressure (mmHg): 110/71 Reference Range: 80 - 120 mg / dl Electronic Signature(s) Signed: 01/27/2023 5:23:50 PM By: Wyn Iha Entered By: Wyn Iha on 01/20/2023 13:36:24

## 2023-01-29 NOTE — Progress Notes (Signed)
 [name], [name] ([id number]) [id number] Page 1 of 9 Visit Report for 01/27/2023 Arrival Information Details Patient Name: Date of Service: [name], [name] [name] 01/27/2023 1:15 PM Medical Record Number: [id number] Patient Account Number: [id number] Date of Birth/Sex: Treating RN: 1980-01-24 (43 y.o. F) Primary Care Rasean Joos: Celestia Browning Other Clinician: Referring Zak Gondek: Treating Mercer Stallworth/Extender: Rosan Harlene Celestia Browning Devra in Treatment: 24 Visit Information History Since Last Visit Added or deleted any medications: No Patient Arrived: Wheel Chair Any new allergies or adverse reactions: No Arrival Time: 13:03 Had a fall or experienced change in No Accompanied By: self activities of daily living that may affect Transfer Assistance: None risk of falls: Patient Identification Verified: Yes Signs or symptoms of abuse/neglect since last No Secondary Verification Process Completed: Yes visito Patient Requires Transmission-Based Precautions: No Hospitalized since last visit: No Patient Has Alerts: Yes Implantable device outside of the clinic No Patient Alerts: Patient on Blood Thinner excluding Eliquis  cellular tissue based products placed in the ABI R 1.02 (08/21/22) center ABI L 0.99 (08/21/22) since last visit: Has Dressing in Place as Prescribed: Yes Has Compression in Place as Prescribed: No Has Footwear/Offloading in Place as Yes Prescribed: Right: Removable Cast Walker/Walking Boot T Contact Cast otal Pain Present Now: No Electronic Signature(s) Signed: 01/27/2023 5:23:50 PM By: Wyn Iha Entered By: Wyn Iha on 01/27/2023 13:13:19 -------------------------------------------------------------------------------- Encounter Discharge Information Details Patient Name: Date of Service: [name], [name] 01/27/2023 1:15 PM Medical Record Number: [id number] Patient Account Number: [id number] Date of Birth/Sex: Treating  RN: 1980-03-05 (43 y.o. JEANELL Drury Nestle Primary Care Paisley Grajeda: Celestia Browning Other Clinician: Referring Cassia Fein: Treating Debara Kamphuis/Extender: Rosan Harlene Celestia Browning Devra in Treatment: 24 Encounter Discharge Information Items Post Procedure Vitals Discharge Condition: Stable Temperature (F): 98.3 Ambulatory Status: Wheelchair Pulse (bpm): 88 Discharge Destination: Home Respiratory Rate (breaths/min): 18 Transportation: Private Auto Blood Pressure (mmHg): 122/85 Accompanied By: self Schedule Follow-up Appointment: Yes Clinical Summary of Care: Electronic Signature(s) Signed: 01/27/2023 4:48:15 PM By: Drury Nestle RN, BSN Entered By: Drury Nestle on 01/27/2023 14:11:41 Schuitema, RAINELLE CROME ([id number]) [id number] Page 2 of 9 -------------------------------------------------------------------------------- Lower Extremity Assessment Details Patient Name: Date of Service: [name], [name] 01/27/2023 1:15 PM Medical Record Number: [id number] Patient Account Number: [id number] Date of Birth/Sex: Treating RN: 1980/10/31 (43 y.o. F) Primary Care Sherrian Nunnelley: Celestia Browning Other Clinician: Referring Tacy Chavis: Treating Jabron Weese/Extender: Rosan Harlene Celestia Browning Devra in Treatment: 24 Edema Assessment Assessed: [Left: No] [Right: No] Edema: [Left: Ye] [Right: s] Calf Left: Right: Point of Measurement: 31 cm From Medial Instep 50.5 cm Ankle Left: Right: Point of Measurement: 9 cm From Medial Instep 29 cm Vascular Assessment Extremity colors, hair growth, and conditions: Extremity Color: [Right:Hyperpigmented] Hair Growth on Extremity: [Right:No] Temperature of Extremity: [Right:Warm] Capillary Refill: [Right:< 3 seconds] Dependent Rubor: [Right:No Yes] Electronic Signature(s) Signed: 01/27/2023 5:23:50 PM By: Wyn Iha Entered By: Wyn Iha on 01/27/2023  13:30:05 -------------------------------------------------------------------------------- Multi Wound Chart Details Patient Name: Date of Service: [name], [name] 01/27/2023 1:15 PM Medical Record Number: [id number] Patient Account Number: [id number] Date of Birth/Sex: Treating RN: 24-Feb-1980 (43 y.o. F) Primary Care Karolyne Timmons: Celestia Browning Other Clinician: Referring Novah Goza: Treating Kore Madlock/Extender: Rosan Harlene Celestia Browning Devra in Treatment: 24 Vital Signs Height(in): 69 Pulse(bpm): 88 Weight(lbs): 330 Blood Pressure(mmHg): 122/85 Body Mass Index(BMI): 48.7 Temperature(F): 98.3 Respiratory Rate(breaths/min): 18 [4:Photos:] [N/A:N/A] Right Amputation Site - N/A N/A Wound Location: Transmetatarsal Shear/Friction N/A N/A Wounding Event: Diabetic Wound/Ulcer of the Lower N/A N/A Primary Etiology:  Extremity Dehisced Wound N/A N/A Secondary Etiology: Deep Vein Thrombosis, Hypertension, N/A N/A Comorbid History: Type II Diabetes, Osteomyelitis, Neuropathy 07/08/2022 N/A N/A Date Acquired: 24 N/A N/A Weeks of Treatment: Open N/A N/A Wound Status: No N/A N/A Wound Recurrence: 1.1x0.5x0.4 N/A N/A Measurements L x W x D (cm) 0.432 N/A N/A A (cm) : rea 0.173 N/A N/A Volume (cm) : -96.40% N/A N/A % Reduction in A rea: 1.70% N/A N/A % Reduction in Volume: Grade 3 N/A N/A Classification: Large N/A N/A Exudate A mount: Serosanguineous N/A N/A Exudate Type: red, brown N/A N/A Exudate Color: Thickened N/A N/A Wound Margin: Large (67-100%) N/A N/A Granulation A mount: Red, Pink N/A N/A Granulation Quality: Small (1-33%) N/A N/A Necrotic A mount: Fat Layer (Subcutaneous Tissue): Yes N/A N/A Exposed Structures: Fascia: No Tendon: No Muscle: No Joint: No Bone: No Small (1-33%) N/A N/A Epithelialization: Debridement - Excisional N/A N/A Debridement: Pre-procedure Verification/Time Out 14:05 N/A N/A Taken: Lidocaine  4% Topical Solution  N/A N/A Pain Control: Subcutaneous, Slough N/A N/A Tissue Debrided: Skin/Subcutaneous Tissue N/A N/A Level: 0.43 N/A N/A Debridement A (sq cm): rea Curette N/A N/A Instrument: Moderate N/A N/A Bleeding: Pressure N/A N/A Hemostasis A chieved: 0 N/A N/A Procedural Pain: 0 N/A N/A Post Procedural Pain: Procedure was tolerated well N/A N/A Debridement Treatment Response: 1.1x0.5x0.4 N/A N/A Post Debridement Measurements L x W x D (cm) 0.173 N/A N/A Post Debridement Volume: (cm) Callus: Yes N/A N/A Periwound Skin Texture: Excoriation: No Induration: No Crepitus: No Rash: No Scarring: No Maceration: Yes N/A N/A Periwound Skin Moisture: Dry/Scaly: No Atrophie Blanche: No N/A N/A Periwound Skin Color: Cyanosis: No Ecchymosis: No Erythema: No Hemosiderin Staining: No Mottled: No Pallor: No Rubor: No No Abnormality N/A N/A Temperature: Yes N/A N/A Tenderness on Palpation: Debridement N/A N/A Procedures Performed: T Contact Cast otal Treatment Notes Wound #4 (Amputation Site - Transmetatarsal) Wound Laterality: Right Cleanser Soap and Water Discharge Instruction: May shower and wash wound with dial  antibacterial soap and water prior to dressing change. On days not changing the dressing, please keep wound/dressing dry Ent, Denea L ([id number]) [phone].pdf Page 4 of 9 Peri-Wound Care Topical Gentamicin  Discharge Instruction: As directed by physician Primary Dressing Promogran Prisma Matrix, 4.34 (sq in) (silver collagen) Discharge Instruction: Moisten collagen with saline or hydrogel Secondary Dressing ABD Pad, 8x10 Discharge Instruction: Apply over primary dressing as directed. Woven Gauze Sponge, Non-Sterile 4x4 in Discharge Instruction: Apply over primary dressing as directed. Zetuvit Plus 4x8 in Discharge Instruction: Apply over primary dressing as directed. Secured With [employer], 4.5x3.1 (in/yd) Discharge  Instruction: Secure with Kerlix as directed. 53M Medipore H Soft Cloth Surgical T ape, 4 x 10 (in/yd) Discharge Instruction: Secure with tape as directed. total contact cast size 4 Discharge Instruction: last layer applied by Aletta Edmunds. Compression Wrap Compression Stockings Add-Ons Electronic Signature(s) Signed: 01/27/2023 5:12:23 PM By: Rosan Raisin DO Entered By: Rosan Raisin on 01/27/2023 15:17:23 -------------------------------------------------------------------------------- Multi-Disciplinary Care Plan Details Patient Name: Date of Service: [name], [name] 01/27/2023 1:15 PM Medical Record Number: [id number] Patient Account Number: [id number] Date of Birth/Sex: Treating RN: 10-08-1980 (43 y.o. JEANELL Drury Nestle Primary Care Burgundy Matuszak: Celestia Browning Other Clinician: Referring Eilam Shrewsbury: Treating Nyjah Denio/Extender: Rosan Raisin Celestia Browning Devra in Treatment: 24 Active Inactive Nutrition Nursing Diagnoses: Impaired glucose control: actual or potential Goals: Patient/caregiver agrees to and verbalizes understanding of need to obtain nutritional consultation Date Initiated: 08/08/2022 Date Inactivated: 12/31/2022 Target Resolution Date: 01/13/2024 Goal Status: Met Patient/caregiver will maintain therapeutic glucose control Date  Initiated: 08/08/2022 Target Resolution Date: 01/13/2024 Goal Status: Active Interventions: Assess HgA1c results as ordered upon admission and as needed BRITAINY, KOZUB ([id number]) [id number].pdf Page 5 of 9 Provide education on elevated blood sugars and impact on wound healing Provide education on nutrition Treatment Activities: Obtain HgA1c : 08/08/2022 Patient referred to Primary Care Physician for further nutritional evaluation : 08/08/2022 Notes: Pain, Acute or Chronic Nursing Diagnoses: Pain, acute or chronic: actual or potential Potential alteration in comfort, pain Goals: Patient will verbalize  adequate pain control and receive pain control interventions during procedures as needed Date Initiated: 08/08/2022 Date Inactivated: 12/31/2022 Target Resolution Date: 01/13/2024 Goal Status: Met Patient/caregiver will verbalize comfort level met Date Initiated: 08/08/2022 Target Resolution Date: 01/13/2024 Goal Status: Active Interventions: Complete pain assessment as per visit requirements Encourage patient to take pain medications as prescribed Provide education on pain management Treatment Activities: Administer pain control measures as ordered : 08/08/2022 Notes: Wound/Skin Impairment Nursing Diagnoses: Knowledge deficit related to ulceration/compromised skin integrity Goals: Patient/caregiver will verbalize understanding of skin care regimen Date Initiated: 08/08/2022 Date Inactivated: 12/31/2022 Target Resolution Date: 01/13/2024 Goal Status: Met Ulcer/skin breakdown will heal within 14 weeks Date Initiated: 08/08/2022 Target Resolution Date: 01/13/2024 Goal Status: Active Interventions: Assess patient/caregiver ability to perform ulcer/skin care regimen upon admission and as needed Assess ulceration(s) every visit Provide education on ulcer and skin care Treatment Activities: Skin care regimen initiated : 08/08/2022 Topical wound management initiated : 08/08/2022 Notes: Electronic Signature(s) Signed: 01/27/2023 4:48:15 PM By: Drury Nestle RN, BSN Entered By: Drury Nestle on 01/27/2023 13:39:30 -------------------------------------------------------------------------------- Pain Assessment Details Patient Name: Date of Service: [name] LULLA LOA, [name] 01/27/2023 1:15 PM Medical Record Number: [id number] Patient Account Number: [id number] Date of Birth/Sex: Treating RN: 03-Oct-1980 (43 y.o. F) Primary Care Unique Searfoss: Celestia Browning Other Clinician: Referring Linh Johannes: Treating Carlisle Enke/Extender: Rosan Harlene Celestia Browning Devra in Treatment: [address], Zitlali L  ([id number]) [id number] Page 6 of 9 Active Problems Location of Pain Severity and Description of Pain Patient Has Paino Yes Site Locations Pain Location: Pain in Ulcers Rate the pain. Current Pain Level: 2 Pain Management and Medication Current Pain Management: Electronic Signature(s) Signed: 01/27/2023 5:23:50 PM By: Wyn Iha Entered By: Wyn Iha on 01/27/2023 13:13:44 -------------------------------------------------------------------------------- Patient/Caregiver Education Details Patient Name: Date of Service: [name] LULLA LOA, [name] 1/13/2025andnbsp1:15 PM Medical Record Number: [id number] Patient Account Number: [id number] Date of Birth/Gender: Treating RN: May 13, 1980 (43 y.o. JEANELL Drury Nestle Primary Care Physician: Celestia Browning Other Clinician: Referring Physician: Treating Physician/Extender: Rosan Harlene Celestia Browning Devra in Treatment: 24 Education Assessment Education Provided To: Patient Education Topics Provided Wound/Skin Impairment: Handouts: Caring for Your Ulcer Methods: Explain/Verbal Responses: Reinforcements needed Electronic Signature(s) Signed: 01/27/2023 4:48:15 PM By: Drury Nestle RN, BSN Entered By: Drury Nestle on 01/27/2023 13:39:42 [place], RAINELLE CROME ([id number]) [id number] Page 7 of 9 -------------------------------------------------------------------------------- Wound Assessment Details Patient Name: Date of Service: [name], [name] 01/27/2023 1:15 PM Medical Record Number: [id number] Patient Account Number: [id number] Date of Birth/Sex: Treating RN: 1980/11/28 (43 y.o. 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 Location: Right Amputation Site -  Transmetatarsal Secondary Dehisced Wound Wounding Event: Shear/Friction Etiology: Date Acquired: 07/08/2022 Wound Status: Open Weeks Of Treatment: 24 Comorbid Deep Vein Thrombosis, Hypertension, Type II Diabetes, Clustered Wound: No History: Osteomyelitis, Neuropathy Photos Wound Measurements Length: (cm) 1.1 Width: (cm) 0.5  Depth: (cm) 0.4 Area: (cm) 0.432 Volume: (cm) 0.173 % Reduction in Area: -96.4% % Reduction in Volume: 1.7% Epithelialization: Small (1-33%) Tunneling: No Undermining: No Wound Description Classification: Grade 3 Wound Margin: Thickened Exudate Amount: Large Exudate Type: Serosanguineous Exudate Color: red, brown Foul Odor After Cleansing: No Slough/Fibrino Yes Wound Bed Granulation Amount: Large (67-100%) Exposed Structure Granulation Quality: Red, Pink Fascia Exposed: No Necrotic Amount: Small (1-33%) Fat Layer (Subcutaneous Tissue) Exposed: Yes Tendon Exposed: No Muscle Exposed: No Joint Exposed: No Bone Exposed: No Periwound Skin Texture Texture Color No Abnormalities Noted: No No Abnormalities Noted: Yes Callus: Yes Temperature / Pain Crepitus: No Temperature: No Abnormality Excoriation: No Tenderness on Palpation: Yes Induration: No Rash: No Scarring: No Moisture No Abnormalities Noted: No Dry / Scaly: No Maceration: Yes Beezley, Kadelyn L ([id number]) [id number] Page 8 of 9 Treatment Notes Wound #4 (Amputation Site - Transmetatarsal) Wound Laterality: Right Cleanser Soap and Water Discharge Instruction: May shower and wash wound with dial  antibacterial soap and water prior to dressing change. On days not changing the dressing, please keep wound/dressing dry Peri-Wound Care Topical Gentamicin  Discharge Instruction: As directed by physician Primary Dressing Promogran Prisma Matrix, 4.34 (sq in) (silver collagen) Discharge Instruction: Moisten collagen with saline or hydrogel Secondary Dressing ABD  Pad, 8x10 Discharge Instruction: Apply over primary dressing as directed. Woven Gauze Sponge, Non-Sterile 4x4 in Discharge Instruction: Apply over primary dressing as directed. Zetuvit Plus 4x8 in Discharge Instruction: Apply over primary dressing as directed. Secured With [employer], 4.5x3.1 (in/yd) Discharge Instruction: Secure with Kerlix as directed. 36M Medipore H Soft Cloth Surgical T ape, 4 x 10 (in/yd) Discharge Instruction: Secure with tape as directed. total contact cast size 4 Discharge Instruction: last layer applied by Anira Sen[place]. Compression Wrap Compression Stockings Add-Ons Electronic Signature(s) Signed: 01/27/2023 5:23:50 PM By: Wyn Iha Entered By: Wyn Iha on 01/27/2023 13:34:39 -------------------------------------------------------------------------------- Vitals Details Patient Name: Date of Service: [name], [name] 01/27/2023 1:15 PM Medical Record Number: [id number] Patient Account Number: [id number] Date of Birth/Sex: Treating RN: 1980-06-19 (43 y.o. F) Primary Care Nina Hoar: Celestia Browning Other Clinician: Referring Safire Gordin: Treating Jong Rickman/Extender: Rosan Harlene Celestia Browning Devra in Treatment: 24 Vital Signs Time Taken: 13:32 Temperature (F): 98.3 Height (in): 69 Pulse (bpm): 88 Weight (lbs): 330 Respiratory Rate (breaths/min): 18 Body Mass Index (BMI): 48.7 Blood Pressure (mmHg): 122/85 Reference Range: 80 - 120 mg / dl Electronic Signature(s) Signed: 01/27/2023 5:23:50 PM By: Wyn Iha MOATS, Dagny L1/13/2025 5:23:50 PM By: Wyn Iha Signed: ([id number]) [id number] Page 9 of 9 Entered By: Wyn Iha on 01/27/2023 13:33:19

## 2023-02-03 ENCOUNTER — Encounter (HOSPITAL_BASED_OUTPATIENT_CLINIC_OR_DEPARTMENT_OTHER): Payer: Medicaid Other | Admitting: Internal Medicine

## 2023-02-03 DIAGNOSIS — E11621 Type 2 diabetes mellitus with foot ulcer: Secondary | ICD-10-CM | POA: Diagnosis not present

## 2023-02-03 DIAGNOSIS — L97512 Non-pressure chronic ulcer of other part of right foot with fat layer exposed: Secondary | ICD-10-CM | POA: Diagnosis not present

## 2023-02-03 NOTE — Progress Notes (Signed)
CASHA, SALEN ([id number]) [id number] Page 1 of 11 Visit Report for 02/03/2023 Chief Complaint Document Details Patient Name: Date of Service: [name], [place] [name] 02/03/2023 1:15 PM Medical Record Number: [id number] Patient Account Number: [id number] Date of Birth/Sex: Treating RN: 1980/10/04 (43 y.o. F) Primary Care Provider: Gwinda Passe Other Clinician: Referring Provider: Treating Provider/Extender: Grace Isaac in Treatment: 25 Information Obtained from: Patient Chief Complaint 01/22/2021; Osteomyelitis of the right foot status post transmetatarsal amputation with surgical site dehiscence 08/08/2022; reopening of wound to the right foot with a history of transmetatarsal amputation with previous difficulty healing surgical site Electronic Signature(s) Signed: 02/03/2023 3:23:55 PM By: Geralyn Corwin DO Entered By: Geralyn Corwin on 02/03/2023 14:53:45 -------------------------------------------------------------------------------- Debridement Details Patient Name: Date of Service: [name], [name] 02/03/2023 1:15 PM Medical Record Number: [id number] Patient Account Number: [id number] Date of Birth/Sex: Treating RN: 10/15/1980 (43 y.o. Ginette Pitman Primary Care Provider: Gwinda Passe Other Clinician: Referring Provider: Treating Provider/Extender: Grace Isaac in Treatment: 25 Debridement Performed for Assessment: Wound #4 Right Amputation Site - Transmetatarsal Performed By: Physician Geralyn Corwin, DO The following information was scribed by: Midge Aver The information was scribed for: Geralyn Corwin Debridement Type: Debridement Severity of Tissue Pre Debridement: Fat layer exposed Level of Consciousness (Pre-procedure): Awake and Alert Pre-procedure Verification/Time Out Yes - 14:12 Taken: Start Time: 14:12 Pain Control: Lidocaine 4% T opical Solution Percent of  Wound Bed Debrided: 100% T Area Debrided (cm): otal 0.39 Tissue and other material debrided: Viable, Non-Viable, Callus, Slough, Subcutaneous, Slough Level: Skin/Subcutaneous Tissue Debridement Description: Excisional Instrument: Curette Bleeding: Minimum Hemostasis Achieved: Pressure Procedural Pain: 0 Post Procedural Pain: 0 Response to Treatment: Procedure was tolerated well Level of Consciousness (Post- Awake and Alert procedure): Post Debridement Measurements of Total Wound Length: (cm) 1 Width: (cm) 0.5 Depth: (cm) 0.5 Garman, Hayde L ([id number]) [id number] Page 2 of 11 Volume: (cm) 0.196 Character of Wound/Ulcer Post Debridement: Stable Severity of Tissue Post Debridement: Fat layer exposed Post Procedure Diagnosis Same as Pre-procedure Electronic Signature(s) Signed: 02/03/2023 3:23:55 PM By: Geralyn Corwin DO Signed: 02/03/2023 4:09:14 PM By: Midge Aver MSN RN CNS WTA Entered By: Midge Aver on 02/03/2023 14:52:30 -------------------------------------------------------------------------------- HPI Details Patient Name: Date of Service: [name], [name] 02/03/2023 1:15 PM Medical Record Number: [id number] Patient Account Number: [id number] Date of Birth/Sex: Treating RN: [birth year] (43 y.o. F) Primary Care Provider: Gwinda Passe Other Clinician: Referring Provider: Treating Provider/Extender: Grace Isaac in Treatment: 25 History of Present Illness HPI Description: Admission 01/22/2021 [name] is a 43 year old female with a past medical history of insulin-dependent uncontrolled type 2 diabetes with last hemoglobin A1c of 13.5, osteomyelitis of the right foot status post transmetatarsal amputation on 12/18/2020 that presents to the clinic for right foot wound. She has had dehiscence of the surgical site. She is currently using wet-to-dry dressings. She has a PICC line and receiving IV ceftriaxone  daily for her osteomyelitis. There is an end date of 01/27/2021. She is also taking oral metronidazole. She currently denies systemic signs of infection. 1/19; patient presents for follow-up. She was diagnosed with a DVT to the right lower extremity 2 days ago. She is on Eliquis now. She is scheduled to see her infectious disease doctor tomorrow. She has been using Dakin's wet-to-dry dressings. She denies systemic signs of infection. 1/26; patient presents for follow-up. She saw infectious disease on 1/21 started on Augmentin. Her PICC line and IV ceftriaxone  was discontinued. Patient reports stability to her wound. She has been using Dakin's wet-to-dry dressings. She currently denies systemic signs of infection. 2/3; patient presents for follow-up. She continues to use Dakin's wet-to-dry dressings to the wound bed. She saw Dr. Manson Passey with infectious disease yesterday and is continuing Augmentin. T entative end date is 2/16. Patient reports following up with orthopedics. She states there is no further plan from them. She currently denies systemic signs of infection. 2/10; patient presents for follow-up. She continues to use Dakin's wet to dry dressings. She is scheduled to have her MRI done on 2/14. She states that she had pain to the debridement site from last clinic visit and declines debridement today. She denies systemic signs of infection. She continues to have yellow thick drainage. 2/20; patient presents for follow-up. She continues to use Dakin's wet-to-dry dressings. She obtained her MRI. The results showed an abscess and she is scheduled to see her orthopedic surgeon on 2/23. She saw infectious disease 2/17 and her antibiotics were extended. She currently denies systemic signs of infection. 3/6; patient presents for follow-up. She had debridement and irrigation of her foot on 03/10/2021 due to abscess noted on MRI. She was started on IV ceftriaxone and oral Flagyl. She has no issues or  complaints today. She has been using iodoform packing to the tunnel and Dakin's wet-to-dry to the opening. 03/26/2021: She continues on IV ceftriaxone and oral metronidazole. She has follow-up with infectious disease tomorrow. No significant issues or complaints today. Her mother continues to help her with her wound dressing, using iodoform packing strips into the tunnel and Dakin's to the open portion of the wound. 3/20; patient presents for follow-up. She continues to be on IV ceftriaxone in oral metronidazole. She has been using iodoform to the tunnel and Dakin's wet-to- dry to the open wound. She denies signs of infection. 3/27; patient presents for follow-up. She no longer has a PICC line. She has been using iodoform to the tunnel and Dakin's wet-to-dry to the open wound. She reports improvement in wound healing. She denies signs of infection. 4/3; patient presents for follow-up. She states she has been using Hydrofera Blue to the open wound and iodoform packing to the tunnel without any issues. She denies signs of infection. 4/18; patient presents for follow-up. She saw infectious disease on 4/11. She has finished her oral antibiotics and completed a total of 6 weeks of antibiotics (this includes IV as well). No further antibiotics needed. She has been using Hydrofera Blue and iodoform packing. She states that the tunneled area has come in and the iodoform is not staying in place anymore. She has no issues or complaints today. She denies signs of infection. 4/24; patient presents for follow-up. She saw Dr. Carlene Coria, plastic surgery to discuss potential skin graft/substitute placement. At this time he thinks that the skin graft would likely not take. He is in agreement with trying a wound VAC. Patient has been using Hydrofera Blue dressing changes with no issues. She denies signs of infection. She reports improvement in wound healing. 5/1; patient presents for follow-up. Unfortunately patient did  not have insurance when we ran for the pico. There is an assistance program and we are trying to get this accommodated for the patient. In the meantime she has been using Hydrofera Blue without any issues. She denies signs of infection. 5/8; patient presents for follow-up. We have not heard back if pico is covered by her insurance. She has been using collagen to the wound bed  over the past LAKEA, KOZLOSKI ([id number]) [id number] Page 3 of 11 week. She denies signs of infection. 5/18; patient presents for follow-up. She has been using collagen to the wound bed without issues. Again we have not heard if pico is covered by her insurance. She has no issues or complaints today. 5/23; patient presents for follow-up. She has been using collagen to the wound bed. She has no issues or complaints today. She obtained the wound VAC from [facility] and brought this in today. She denies signs of infection. 6/1; patient presents for follow-up. She has been using the wound VAC for the past week. She has had this changed twice since she was last here. She reports more maceration to the periwound. She denies signs of infection. 6/7; right TMA site. There are 2 wounds 0 separated by a bridge of healed tissue. The more lateral area has undermining. Both areas have healthy looking granulation at the base but relative the size of the wound is fairly deep. There is no exposed bone no evidence of infection. Her wound VAC was put on hold last week because of surrounding skin maceration she has been using collagen this week. She has a modified shoe 6/12; patient presents for follow-up. Last week the wound VAC was reinitiated. She had no issues with the wound VAC itself. Today she has maceration again noted to the surrounding skin. She denies signs of infection. 6/27; patient presents for follow-up. She has been using Medihoney to the wound bed. We took a break from the wound VAC because the periwound  was macerated. She still has some areas of maceration to the distal foot where there is a callus. She currently denies signs of infection. 7/11; patient presents for follow-up. She has been using Medihoney to the wound bed. She has developed some increased warmth and erythema to the lateral aspect of the right foot. She states this is occurred over the past week and there is increased pain. No drainage noted. 7/17; patient presents for follow-up. She has been using Medihoney to the wound bed. She completed her course of Bactrim. She reports improvement in symptoms. 7/24; Patient presents for follow up. She has been using Medihoney to the wound bed without issues. She completed another course of Bactrim. She reports improvement in her symptoms but still has some mild tenderness to the medial aspect of the foot. 7/31; Patient presents for follow-up. She has been using Medihoney and Dakin's to the wound bed. She denies signs of infection. 8/14; patient presents for follow-up. She has been using Dakin's wet-to-dry packing strips to the right medial aspect of the amputation site and Medihoney to the anterior site. She has no issues or complaints today. She has started physical therapy. She denies signs of infection. 8/29; patient presents for follow-up. She has been using Dakin's wet-to-dry packing strips to the right medial aspect of the amputation site however this is becoming more difficult to place. She did report that she had increased redness and swelling to that site and developed some drainage. It has resolved. She continues with physical therapy. 9/8; patient presents for follow-up. We have been using silver alginate to the tunneled area. She completed her course of antibiotics. She reports no pain, increased swelling or erythema. 9/21; patient presents for follow-up. She has been using gentamicin to the tunneled area. She has no issues or complaints today. She denies signs of infection. 10/2;  patient presents for follow-up. She is scheduled to have her CT scan on 10/9. She currently  denies signs of infection. She denies increased warmth, erythema or purulent drainage from the wound bed. She has been using Dakin's wet-to-dry dressings. 10/12; patient presents for follow-up. She had her CT scan on 10/9 that showed A small irregular rim-enhancing fluid pocket communicating to the overlying soft tissues of the sinus tract compatible with a small abscess. Currently she denies systemic signs of infection. She has been doing Dakin's wet-to-dry packing strips but it is hard for her to pack into the narrow opening. 10/30; patient presents for follow-up. Since last clinic visit she has had OR debridement of her Right foot by Dr. Odis Hollingshead Due to abscess noted on CT. She has been using iodoform packing. 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 She will complete 2 weeks of this and continue with oral amoxicillin for the next 6 to 12 months. She follows with Dr. Luciana Axe for this. She currently denies signs of infection. 11/13; patient presents for follow-up. Patient has been using silver alginate with gentamicin to the wound bed. She has no issues or complaints today she. She reports improvement in wound healing. 12/4; patient presents for follow-up. She has been using silver alginate and gentamicin to the wound bed. She has no issues or complaints today. 12/8; patient presents for follow-up. She has been using silver alginate with gentamicin to the wound bed. She presents for her first cast placement. She will be back early next week for her obligatory cast change. 12/12; patient presents for follow-up. We have been using collagen with antibiotic ointment to the wound bed under a total contact cast. She has tolerated this well. She is improved in wound healing. 12/18; patient presents for follow-up. We have been using collagen with antibiotic ointment under the  total contact cast. She has no complaints today. 1/2; The cast was placed at last clinic visit however patient missed her follow-up and this was taken off last week at a nurse visit. Patient has been using packing strips to the medial narrowed wound. She has noted no drainage. 1/22; patient presents for follow-up. She has been keeping the area covered. She reports no drainage. She has had no issues to the previous wound site. She denies any signs of infection including increased warmth, erythema or purulent drainage. 08/08/2022 Ms. Crystallee Mancillas is a 43 year old female with a past medical history of insulin-dependent uncontrolled type 2 diabetes with last hemoglobin A1c of 8.8, chronic osteomyelitis of the right foot status post transmetatarsal amputation on 12/18/2020 that has been previously treated for surgical dehiscence of this site in our clinic. She presents today 6 months after discharge from our clinic due to healed right foot wound with now reopening of the previous surgical site. She reports chronic pain to the site however denies purulent drainage, increased warmth or erythema. She noticed the wound opening about 1 month ago. She has been doing physical therapy without significant issues. She has inserts to her shoe however she has been advised to follow-up with [employer] for new custom inserts by her orthopedic surgeon now that she has a new wound. She states she will try and do this. She has been using antibiotic ointment to the wound bed. 8/1; patient presents for follow-up. She has been taking her antibiotics without issues. She has been using Dakin's wet-to-dry dressings to the wound bed. The wound is smaller. She denies systemic signs of infection. 8/13; patient presents for follow-up. She had ABIs completed that were normal. On the right it was 1.02 and  on the left was 0.99. She has been using silver alginate to the wound bed with gentamicin ointment. She states it is hard to pack the  wound bed. She currently denies signs of infection. 8/29; patient presents for follow-up. She has been using collagen with antibiotic ointment to the wound bed. Wound is stable. She denies signs of infection. KAREM, WEEDMAN ([id number]) [id number] Page 4 of 11 9/5; patient presents for follow-up. She has been using silver alginate to the wound bed. Wound is slightly wider. She denies signs of infection. 9/12; patient presents for follow-up. She has been using silver alginate to the wound bed. Wound is slightly smaller today. 9/19; right foot in the setting of a TMA over the first metatarsal head. She has thick callus around this area small fissured wound which actually looks like it is attempting to close. She has a offloading shoe I am not sure how much activity she is in. I did not have a lot of time to see her today because of transportation 9/26; right foot in the setting of a previous TMA. The wound is over the plantar first metatarsal head. This is a fissured wound with thick callus around the margins. It is small in terms of overall surface area but has a depth of about 0.6 cm there was no palpable bone. She has a history of chronic osteomyelitis although she has been treated for this I have reviewed CT scans from 2023 also an MRI from 10/26/2021. Neither imaging study showed osteomyelitis in this area although the CT scan suggested osteomyelitis in the fourth metatarsal head. There has been problems with abscesses communicating with the wound although there does not appear to be purulent drainage currently 10/3; right foot at the TMA site. Small probing wound. I changed her to Iodosorb with gauze last time. May be some improvement. She has a depth of about 0.6 cm but there is no palpable bone. She has been previously treated for chronic osteomyelitis in this area. 10/17; this is a patient with a probing wound at the right TMA site. Underlying osteomyelitis  previously treated earlier this year. I been using Iodosorb to try and help constrict this wound however today she comes in with a larger circumference a larger tunnel and this probably sits right on top of bone. Also worrisome is there was a streaking dark area just caudal to the wound on the dorsal surface of the foot. The patient is complaining of more pain but no fever. 10/24; culture of this area [bone scraping] revealed Acinetobacter and 2 coag negative staph. I am going to give her Septra DS 1 p.o. twice daily for 2 weeks. I think it would be simplistic to call this a bone culture but it certainly was a deep culture. An x-ray was done but that has not been reported yet. I had wanted to get lab work including a CBC with differential ESR and C-reactive protein but I am not actually sure that got done last week. We have been using gent and silver alginate. 11/7; patient is still taking trimethoprim/sulfamethoxazole for the assessment of bacterial identified last week. Lab work revealed a C-reactive protein of 4.1 which is elevated from 1.7 but not dramatic. White count was normal at 8.4 x-ray of the right foot revealed minimal new lucency and cortical erosion within the adjacent distal plantar aspect of the metatarsals concerning for acute osteomyelitis early. 11/19; the patient has completed her Septra DS which I gave to her for 2 weeks. She has  her MRI tomorrow but then we will have the obligatory 5 to 10- day/workday wait to get a report. I am going to resume her Septra DS for a further 2 weeks. Then decide on additional antibiotics, infectious disease etc. 12/5; the patient has completed the Septra DS for the originally cultured as Enterobacter on a bone scraping. Her MRI somewhat surprisingly showed status post amputation of the 1st through 5th metatarsal base but no evidence of osteomyelitis. Surrounding skin thickening with mild edema could represent cellulitis. Previous x-ray showed the  suggestion of bone erosion although this was not verified by her MRI. 12/9; patient comes in today for follow-up. She tolerated the total contact cast well. The areas on the right TMA laterally. Medially she has undermining. What I can see of the granulation looks healthy. We are using Prisma and we placed the total contact cast again last week 12/17;. She has a area on the right TMA medially. This seems to have contracted quite a bit this week. There is still 1 cm of depth. No palpable bone. No purulent drainage. Again MRI did not suggest underlying osteomyelitis. She has completed the antibiotics I gave her for S Enterobacter. We put her back in a total contact cast last week using Prisma as the primary dressing 12/23; right TMA medially. This is a weightbearing surface we now have her in a total contact cast. We have been using Prisma and gentamicin. The gentamicin to target S adeno bacteria. She is completed a course of Septra. MRI did not underlying osteomyelitis. 01/13/2023: The length and depth of the wound, including undermining, has decreased a bit since last week. She has some slough accumulation on the surface as well as extensive callus buildup. 01/20/2023; patient presents for follow-up. We have been using Prisma with gentamicin ointment under a total contact cast. She has no issues or complaints today. She denies signs of infection. 01/27/2023; patient presents for follow-up. We have been using Prisma with gentamicin ointment under the total contact cast. She has no issues or complaints. She denies signs of infection. Wound is smaller. 02/03/2023; patient presents for follow-up. We have been using Prisma with antibiotic ointment under the total contact cast. She has no issues or complaints. She denies signs of infection. Wound is stable. Electronic Signature(s) Signed: 02/03/2023 3:23:55 PM By: Geralyn Corwin DO Entered By: Geralyn Corwin on 02/03/2023  14:56:25 -------------------------------------------------------------------------------- Physical Exam Details Patient Name: Date of Service: [name], [name] 02/03/2023 1:15 PM Medical Record Number: [id number] Patient Account Number: [id number] Date of Birth/Sex: Treating RN: 06-May-1980 (43 y.o. F) Primary Care Provider: Gwinda Passe Other Clinician: Referring Provider: Treating Provider/Extender: Grace Isaac in Treatment: 25 Constitutional respirations regular, non-labored and within target range for patient.Marland Kitchen CRISTELLA, PITKIN ([id number]) [id number] Page 5 of 11 Cardiovascular 2+ dorsalis pedis/posterior tibialis pulses. Psychiatric pleasant and cooperative. Notes Open wound to previous transmetatarsal tarsal amputation dehised site. Granulation tissue with slough accumulation noted at the wound bed. Undermining circumferentially. No signs of surrounding infection including increased warmth, erythema or purulent drainage. Electronic Signature(s) Signed: 02/03/2023 3:23:55 PM By: Geralyn Corwin DO Entered By: Geralyn Corwin on 02/03/2023 14:57:21 -------------------------------------------------------------------------------- Physician Orders Details Patient Name: Date of Service: [name], [name] 02/03/2023 1:15 PM Medical Record Number: [id number] Patient Account Number: [id number] Date of Birth/Sex: Treating RN: Jul 22, 1980 (43 y.o. Ginette Pitman Primary Care Provider: Gwinda Passe Other Clinician: Referring Provider: Treating Provider/Extender: Grace Isaac in Treatment: 25 The following information  was scribed by: Midge Aver The information was scribed for: Geralyn Corwin Verbal / Phone Orders: No Diagnosis Coding Follow-up Appointments ppointment in 1 week. - Dr. Mikey Bussing 02/10/23 @ 1:15 Return A ppointment in 2 weeks. - Dr. Mikey Bussing 02/10/23 at 1:15PM (already  scheduled) Return A Return appointment in 3 weeks. - Dr. Mikey Bussing (front office to schedule) Anesthetic (In clinic) Topical Lidocaine 5% applied to wound bed Bathing/ Shower/ Hygiene May shower with protection but do not get wound dressing(s) wet. Protect dressing(s) with water repellant cover (for example, large plastic bag) or a cast cover and may then take shower. Other Bathing/Shower/Hygiene Orders/Instructions: - When changing the dressing (every other day) you may get right foot wet. On days NOT changing the dressing.Keep wound/dressing dry Off-Loading Total Contact Cast to Right Lower Extremity - size 4 Wound Treatment Wound #4 - Amputation Site - Transmetatarsal Wound Laterality: Right Cleanser: Soap and Water 1 x Per Week/30 Days Discharge Instructions: May shower and wash wound with dial antibacterial soap and water prior to dressing change. On days not changing the dressing, please keep wound/dressing dry Topical: Gentamicin 1 x Per Week/30 Days Discharge Instructions: As directed by physician Prim Dressing: Promogran Prisma Matrix, 4.34 (sq in) (silver collagen) 1 x Per Week/30 Days ary Discharge Instructions: Moisten collagen with saline or hydrogel Secondary Dressing: ABD Pad, 8x10 1 x Per Week/30 Days Discharge Instructions: Apply over primary dressing as directed. Secondary Dressing: Woven Gauze Sponge, Non-Sterile 4x4 in 1 x Per Week/30 Days Discharge Instructions: Apply over primary dressing as directed. Secondary Dressing: Zetuvit Plus 4x8 in 1 x Per Week/30 Days Discharge Instructions: Apply over primary dressing as directed. Secured With: [employer], 4.5x3.1 (in/yd) 1 x Per Week/30 Days Discharge Instructions: Secure with Kerlix as directed. OMESHA, DESHOTEL ([id number]) [id number] Page 6 of 11 Secured With: 51M Medipore H Soft Cloth Surgical T ape, 4 x 10 (in/yd) 1 x Per Week/30 Days Discharge Instructions: Secure with tape as  directed. Secured With: total contact cast size 4 1 x Per Week/30 Days Discharge Instructions: last layer applied by provider. Electronic Signature(s) Signed: 02/03/2023 3:23:55 PM By: Geralyn Corwin DO Entered By: Geralyn Corwin on 02/03/2023 14:57:28 -------------------------------------------------------------------------------- Problem List Details Patient Name: Date of Service: [name], [name] 02/03/2023 1:15 PM Medical Record Number: [id number] Patient Account Number: [id number] Date of Birth/Sex: Treating RN: 08/06/1980 (43 y.o. F) Primary Care Provider: Gwinda Passe Other Clinician: Referring Provider: Treating Provider/Extender: Grace Isaac in Treatment: 25 Active Problems ICD-10 Encounter Code Description Active Date MDM Diagnosis L97.512 Non-pressure chronic ulcer of other part of right foot with fat layer exposed 08/08/2022 No Yes E11.621 Type 2 diabetes mellitus with foot ulcer 08/08/2022 No Yes M86.671 Other chronic osteomyelitis, right ankle and foot 08/08/2022 No Yes Inactive Problems Resolved Problems Electronic Signature(s) Signed: 02/03/2023 3:23:55 PM By: Geralyn Corwin DO Entered By: Geralyn Corwin on 02/03/2023 14:53:11 -------------------------------------------------------------------------------- Progress Note Details Patient Name: Date of Service: [name], [name] 02/03/2023 1:15 PM Medical Record Number: [id number] Patient Account Number: [id number] Date of Birth/Sex: Treating RN: 1980/09/13 (43 y.o. F) Primary Care Provider: Gwinda Passe Other Clinician: Referring Provider: Treating Provider/Extender: Grace Isaac in Treatment: [address] [place], Sharol Harness ([id number]) [id number] Page 7 of 11 Chief Complaint Information obtained from Patient 01/22/2021; Osteomyelitis of the right foot status post transmetatarsal amputation with surgical site  dehiscence 08/08/2022; reopening of wound to the right foot with a history of transmetatarsal amputation with previous difficulty  healing surgical site History of Present Illness (HPI) Admission 01/22/2021 Ms. Rein Reining is a 43 year old female with a past medical history of insulin-dependent uncontrolled type 2 diabetes with last hemoglobin A1c of 13.5, osteomyelitis of the right foot status post transmetatarsal amputation on 12/18/2020 that presents to the clinic for right foot wound. She has had dehiscence of the surgical site. She is currently using wet-to-dry dressings. She has a PICC line and receiving IV ceftriaxone daily for her osteomyelitis. There is an end date of 01/27/2021. She is also taking oral metronidazole. She currently denies systemic signs of infection. 1/19; patient presents for follow-up. She was diagnosed with a DVT to the right lower extremity 2 days ago. She is on Eliquis now. She is scheduled to see her infectious disease doctor tomorrow. She has been using Dakin's wet-to-dry dressings. She denies systemic signs of infection. 1/26; patient presents for follow-up. She saw infectious disease on 1/21 started on Augmentin. Her PICC line and IV ceftriaxone was discontinued. Patient reports stability to her wound. She has been using Dakin's wet-to-dry dressings. She currently denies systemic signs of infection. 2/3; patient presents for follow-up. She continues to use Dakin's wet-to-dry dressings to the wound bed. She saw Dr. Manson Passey with infectious disease yesterday and is continuing Augmentin. T entative end date is 2/16. Patient reports following up with orthopedics. She states there is no further plan from them. She currently denies systemic signs of infection. 2/10; patient presents for follow-up. She continues to use Dakin's wet to dry dressings. She is scheduled to have her MRI done on 2/14. She states that she had pain to the debridement site from last clinic visit and  declines debridement today. She denies systemic signs of infection. She continues to have yellow thick drainage. 2/20; patient presents for follow-up. She continues to use Dakin's wet-to-dry dressings. She obtained her MRI. The results showed an abscess and she is scheduled to see her orthopedic surgeon on 2/23. She saw infectious disease 2/17 and her antibiotics were extended. She currently denies systemic signs of infection. 3/6; patient presents for follow-up. She had debridement and irrigation of her foot on 03/10/2021 due to abscess noted on MRI. She was started on IV ceftriaxone and oral Flagyl. She has no issues or complaints today. She has been using iodoform packing to the tunnel and Dakin's wet-to-dry to the opening. 03/26/2021: She continues on IV ceftriaxone and oral metronidazole. She has follow-up with infectious disease tomorrow. No significant issues or complaints today. Her mother continues to help her with her wound dressing, using iodoform packing strips into the tunnel and Dakin's to the open portion of the wound. 3/20; patient presents for follow-up. She continues to be on IV ceftriaxone in oral metronidazole. She has been using iodoform to the tunnel and Dakin's wet-to- dry to the open wound. She denies signs of infection. 3/27; patient presents for follow-up. She no longer has a PICC line. She has been using iodoform to the tunnel and Dakin's wet-to-dry to the open wound. She reports improvement in wound healing. She denies signs of infection. 4/3; patient presents for follow-up. She states she has been using Hydrofera Blue to the open wound and iodoform packing to the tunnel without any issues. She denies signs of infection. 4/18; patient presents for follow-up. She saw infectious disease on 4/11. She has finished her oral antibiotics and completed a total of 6 weeks of antibiotics (this includes IV as well). No further antibiotics needed. She has been using Hydrofera Blue and  iodoform  packing. She states that the tunneled area has come in and the iodoform is not staying in place anymore. She has no issues or complaints today. She denies signs of infection. 4/24; patient presents for follow-up. She saw Dr. Carlene Coria, plastic surgery to discuss potential skin graft/substitute placement. At this time he thinks that the skin graft would likely not take. He is in agreement with trying a wound VAC. Patient has been using Hydrofera Blue dressing changes with no issues. She denies signs of infection. She reports improvement in wound healing. 5/1; patient presents for follow-up. Unfortunately patient did not have insurance when we ran for the pico. There is an assistance program and we are trying to get this accommodated for the patient. In the meantime she has been using Hydrofera Blue without any issues. She denies signs of infection. 5/8; patient presents for follow-up. We have not heard back if pico is covered by her insurance. She has been using collagen to the wound bed over the past week. She denies signs of infection. 5/18; patient presents for follow-up. She has been using collagen to the wound bed without issues. Again we have not heard if pico is covered by her insurance. She has no issues or complaints today. 5/23; patient presents for follow-up. She has been using collagen to the wound bed. She has no issues or complaints today. She obtained the wound VAC from [facility] and brought this in today. She denies signs of infection. 6/1; patient presents for follow-up. She has been using the wound VAC for the past week. She has had this changed twice since she was last here. She reports more maceration to the periwound. She denies signs of infection. 6/7; right TMA site. There are 2 wounds 0 separated by a bridge of healed tissue. The more lateral area has undermining. Both areas have healthy looking granulation at the base but relative the size of the wound is fairly deep. There  is no exposed bone no evidence of infection. Her wound VAC was put on hold last week because of surrounding skin maceration she has been using collagen this week. She has a modified shoe 6/12; patient presents for follow-up. Last week the wound VAC was reinitiated. She had no issues with the wound VAC itself. Today she has maceration again noted to the surrounding skin. She denies signs of infection. 6/27; patient presents for follow-up. She has been using Medihoney to the wound bed. We took a break from the wound VAC because the periwound was macerated. She still has some areas of maceration to the distal foot where there is a callus. She currently denies signs of infection. 7/11; patient presents for follow-up. She has been using Medihoney to the wound bed. She has developed some increased warmth and erythema to the lateral aspect of the right foot. She states this is occurred over the past week and there is increased pain. No drainage noted. 7/17; patient presents for follow-up. She has been using Medihoney to the wound bed. She completed her course of Bactrim. She reports improvement in symptoms. 7/24; Patient presents for follow up. She has been using Medihoney to the wound bed without issues. She completed another course of Bactrim. She reports improvement in her symptoms but still has some mild tenderness to the medial aspect of the foot. 7/31; Patient presents for follow-up. She has been using Medihoney and Dakin's to the wound bed. She denies signs of infection. SEVILLA, BEANE ([id number]) [id number] Page 8 of 11 8/14; patient presents for follow-up.  She has been using Dakin's wet-to-dry packing strips to the right medial aspect of the amputation site and Medihoney to the anterior site. She has no issues or complaints today. She has started physical therapy. She denies signs of infection. 8/29; patient presents for follow-up. She has been using Dakin's wet-to-dry  packing strips to the right medial aspect of the amputation site however this is becoming more difficult to place. She did report that she had increased redness and swelling to that site and developed some drainage. It has resolved. She continues with physical therapy. 9/8; patient presents for follow-up. We have been using silver alginate to the tunneled area. She completed her course of antibiotics. She reports no pain, increased swelling or erythema. 9/21; patient presents for follow-up. She has been using gentamicin to the tunneled area. She has no issues or complaints today. She denies signs of infection. 10/2; patient presents for follow-up. She is scheduled to have her CT scan on 10/9. She currently denies signs of infection. She denies increased warmth, erythema or purulent drainage from the wound bed. She has been using Dakin's wet-to-dry dressings. 10/12; patient presents for follow-up. She had her CT scan on 10/9 that showed A small irregular rim-enhancing fluid pocket communicating to the overlying soft tissues of the sinus tract compatible with a small abscess. Currently she denies systemic signs of infection. She has been doing Dakin's wet-to-dry packing strips but it is hard for her to pack into the narrow opening. 10/30; patient presents for follow-up. Since last clinic visit she has had OR debridement of her Right foot by Dr. Odis Hollingshead Due to abscess noted on CT. She has been using iodoform packing. 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 She will complete 2 weeks of this and continue with oral amoxicillin for the next 6 to 12 months. She follows with Dr. Luciana Axe for this. She currently denies signs of infection. 11/13; patient presents for follow-up. Patient has been using silver alginate with gentamicin to the wound bed. She has no issues or complaints today she. She reports improvement in wound healing. 12/4; patient presents for follow-up.  She has been using silver alginate and gentamicin to the wound bed. She has no issues or complaints today. 12/8; patient presents for follow-up. She has been using silver alginate with gentamicin to the wound bed. She presents for her first cast placement. She will be back early next week for her obligatory cast change. 12/12; patient presents for follow-up. We have been using collagen with antibiotic ointment to the wound bed under a total contact cast. She has tolerated this well. She is improved in wound healing. 12/18; patient presents for follow-up. We have been using collagen with antibiotic ointment under the total contact cast. She has no complaints today. 1/2; The cast was placed at last clinic visit however patient missed her follow-up and this was taken off last week at a nurse visit. Patient has been using packing strips to the medial narrowed wound. She has noted no drainage. 1/22; patient presents for follow-up. She has been keeping the area covered. She reports no drainage. She has had no issues to the previous wound site. She denies any signs of infection including increased warmth, erythema or purulent drainage. 08/08/2022 Ms. Cornie Bosch is a 43 year old female with a past medical history of insulin-dependent uncontrolled type 2 diabetes with last hemoglobin A1c of 8.8, chronic osteomyelitis of the right foot status post transmetatarsal amputation on 12/18/2020 that has been previously  treated for surgical dehiscence of this site in our clinic. She presents today 6 months after discharge from our clinic due to healed right foot wound with now reopening of the previous surgical site. She reports chronic pain to the site however denies purulent drainage, increased warmth or erythema. She noticed the wound opening about 1 month ago. She has been doing physical therapy without significant issues. She has inserts to her shoe however she has been advised to follow-up with [employer] for  new custom inserts by her orthopedic surgeon now that she has a new wound. She states she will try and do this. She has been using antibiotic ointment to the wound bed. 8/1; patient presents for follow-up. She has been taking her antibiotics without issues. She has been using Dakin's wet-to-dry dressings to the wound bed. The wound is smaller. She denies systemic signs of infection. 8/13; patient presents for follow-up. She had ABIs completed that were normal. On the right it was 1.02 and on the left was 0.99. She has been using silver alginate to the wound bed with gentamicin ointment. She states it is hard to pack the wound bed. She currently denies signs of infection. 8/29; patient presents for follow-up. She has been using collagen with antibiotic ointment to the wound bed. Wound is stable. She denies signs of infection. 9/5; patient presents for follow-up. She has been using silver alginate to the wound bed. Wound is slightly wider. She denies signs of infection. 9/12; patient presents for follow-up. She has been using silver alginate to the wound bed. Wound is slightly smaller today. 9/19; right foot in the setting of a TMA over the first metatarsal head. She has thick callus around this area small fissured wound which actually looks like it is attempting to close. She has a offloading shoe I am not sure how much activity she is in. I did not have a lot of time to see her today because of transportation 9/26; right foot in the setting of a previous TMA. The wound is over the plantar first metatarsal head. This is a fissured wound with thick callus around the margins. It is small in terms of overall surface area but has a depth of about 0.6 cm there was no palpable bone. She has a history of chronic osteomyelitis although she has been treated for this I have reviewed CT scans from 2023 also an MRI from 10/26/2021. Neither imaging study showed osteomyelitis in this area although the CT scan  suggested osteomyelitis in the fourth metatarsal head. There has been problems with abscesses communicating with the wound although there does not appear to be purulent drainage currently 10/3; right foot at the TMA site. Small probing wound. I changed her to Iodosorb with gauze last time. May be some improvement. She has a depth of about 0.6 cm but there is no palpable bone. She has been previously treated for chronic osteomyelitis in this area. 10/17; this is a patient with a probing wound at the right TMA site. Underlying osteomyelitis previously treated earlier this year. I been using Iodosorb to try and help constrict this wound however today she comes in with a larger circumference a larger tunnel and this probably sits right on top of bone. Also worrisome is there was a streaking dark area just caudal to the wound on the dorsal surface of the foot. The patient is complaining of more pain but no fever. 10/24; culture of this area [bone scraping] revealed Acinetobacter and 2 coag negative staph. I  am going to give her Septra DS 1 p.o. twice daily for 2 weeks. I think it would be simplistic to call this a bone culture but it certainly was a deep culture. An x-ray was done but that has not been reported yet. I had wanted to get lab work including a CBC with differential ESR and C-reactive protein but I am not actually sure that got done last week. We have been using gent and silver alginate. 11/7; patient is still taking trimethoprim/sulfamethoxazole for the assessment of bacterial identified last week. Lab work revealed a C-reactive protein of 4.1 which is elevated from 1.7 but not dramatic. White count was normal at 8.4 x-ray of the right foot revealed minimal new lucency and cortical erosion within the adjacent distal plantar aspect of the metatarsals concerning for acute osteomyelitis early. TESLYN, DELAHOZ ([id number]) [id number] Page 9 of 11 11/19; the patient has  completed her Septra DS which I gave to her for 2 weeks. She has her MRI tomorrow but then we will have the obligatory 5 to 10- day/workday wait to get a report. I am going to resume her Septra DS for a further 2 weeks. Then decide on additional antibiotics, infectious disease etc. 12/5; the patient has completed the Septra DS for the originally cultured as Enterobacter on a bone scraping. Her MRI somewhat surprisingly showed status post amputation of the 1st through 5th metatarsal base but no evidence of osteomyelitis. Surrounding skin thickening with mild edema could represent cellulitis. Previous x-ray showed the suggestion of bone erosion although this was not verified by her MRI. 12/9; patient comes in today for follow-up. She tolerated the total contact cast well. The areas on the right TMA laterally. Medially she has undermining. What I can see of the granulation looks healthy. We are using Prisma and we placed the total contact cast again last week 12/17;. She has a area on the right TMA medially. This seems to have contracted quite a bit this week. There is still 1 cm of depth. No palpable bone. No purulent drainage. Again MRI did not suggest underlying osteomyelitis. She has completed the antibiotics I gave her for S Enterobacter. We put her back in a total contact cast last week using Prisma as the primary dressing 12/23; right TMA medially. This is a weightbearing surface we now have her in a total contact cast. We have been using Prisma and gentamicin. The gentamicin to target S adeno bacteria. She is completed a course of Septra. MRI did not underlying osteomyelitis. 01/13/2023: The length and depth of the wound, including undermining, has decreased a bit since last week. She has some slough accumulation on the surface as well as extensive callus buildup. 01/20/2023; patient presents for follow-up. We have been using Prisma with gentamicin ointment under a total contact cast. She has no  issues or complaints today. She denies signs of infection. 01/27/2023; patient presents for follow-up. We have been using Prisma with gentamicin ointment under the total contact cast. She has no issues or complaints. She denies signs of infection. Wound is smaller. 02/03/2023; patient presents for follow-up. We have been using Prisma with antibiotic ointment under the total contact cast. She has no issues or complaints. She denies signs of infection. Wound is stable. Objective Constitutional respirations regular, non-labored and within target range for patient.. Vitals Time Taken: 2:01 PM, Height: 69 in, Weight: 330 lbs, BMI: 48.7, Temperature: 98.5 F, Pulse: 84 bpm, Respiratory Rate: 18 breaths/min, Blood Pressure: 115/75 mmHg. Cardiovascular 2+ dorsalis pedis/posterior  tibialis pulses. Psychiatric pleasant and cooperative. General Notes: Open wound to previous transmetatarsal tarsal amputation dehised site. Granulation tissue with slough accumulation noted at the wound bed. Undermining circumferentially. No signs of surrounding infection including increased warmth, erythema or purulent drainage. Integumentary (Hair, Skin) Wound #4 status is Open. Original cause of wound was Shear/Friction. The date acquired was: 07/08/2022. The wound has been in treatment 25 weeks. The wound is located on the Right Amputation Site - Transmetatarsal. The wound measures 1cm length x 0.5cm width x 0.6cm depth; 0.393cm^2 area and 0.236cm^3 volume. There is Fat Layer (Subcutaneous Tissue) exposed. There is no tunneling or undermining noted. There is a large amount of serosanguineous drainage noted. The wound margin is thickened. There is large (67-100%) red, pink granulation within the wound bed. There is a small (1-33%) amount of necrotic tissue within the wound bed. The periwound skin appearance had no abnormalities noted for color. The periwound skin appearance exhibited: Callus, Maceration. The periwound skin  appearance did not exhibit: Crepitus, Excoriation, Induration, Rash, Scarring, Dry/Scaly. Periwound temperature was noted as No Abnormality. The periwound has tenderness on palpation. Assessment Active Problems ICD-10 Non-pressure chronic ulcer of other part of right foot with fat layer exposed Type 2 diabetes mellitus with foot ulcer Other chronic osteomyelitis, right ankle and foot Patient's wound is stable. I debrided nonviable tissue. There was healthy granulation tissue postdebridement. I recommended continued course with antibiotic ointment and collagen under the total contact cast. Follow-up in 1 week. Procedures HETVI, OLDENKAMP ([id number]) [id number] Page 10 of 11 Wound #4 Pre-procedure diagnosis of Wound #4 is a Diabetic Wound/Ulcer of the Lower Extremity located on the Right Amputation Site - Transmetatarsal .Severity of Tissue Pre Debridement is: Fat layer exposed. There was a Excisional Skin/Subcutaneous Tissue Debridement with a total area of 0.39 sq cm performed by Geralyn Corwin, DO. With the following instrument(s): Curette to remove Viable and Non-Viable tissue/material. Material removed includes Callus, Subcutaneous Tissue, and Slough after achieving pain control using Lidocaine 4% T opical Solution. No specimens were taken. A time out was conducted at 14:12, prior to the start of the procedure. A Minimum amount of bleeding was controlled with Pressure. The procedure was tolerated well with a pain level of 0 throughout and a pain level of 0 following the procedure. Post Debridement Measurements: 1cm length x 0.5cm width x 0.5cm depth; 0.196cm^3 volume. Character of Wound/Ulcer Post Debridement is stable. Severity of Tissue Post Debridement is: Fat layer exposed. 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 There was a T Contact Cast Procedure by Geralyn Corwin, DO. otal Post procedure Diagnosis Wound #4: Same as Pre-Procedure Plan Follow-up Appointments: Return Appointment in 1 week. - Dr. Mikey Bussing 02/10/23 @ 1:15 Return Appointment in 2 weeks. - Dr. Mikey Bussing 02/10/23 at 1:15PM (already scheduled) Return appointment in 3 weeks. - Dr. Mikey Bussing (front office to schedule) Anesthetic: (In clinic) Topical Lidocaine 5% applied to wound bed Bathing/ Shower/ Hygiene: May shower with protection but do not get wound dressing(s) wet. Protect dressing(s) with water repellant cover (for example, large plastic bag) or a cast cover and may then take shower. Other Bathing/Shower/Hygiene Orders/Instructions: - When changing the dressing (every other day) you may get right foot wet. On days NOT changing the dressing.Keep wound/dressing dry Off-Loading: T Contact Cast to Right Lower Extremity - size 4 otal WOUND #4: - Amputation Site - Transmetatarsal  Wound Laterality: Right Cleanser: Soap and Water 1 x Per Week/30 Days Discharge Instructions: May shower and wash wound with dial antibacterial soap and water prior to dressing change. On days not changing the dressing, please keep wound/dressing dry Topical: Gentamicin 1 x Per Week/30 Days Discharge Instructions: As directed by physician Prim Dressing: Promogran Prisma Matrix, 4.34 (sq in) (silver collagen) 1 x Per Week/30 Days ary Discharge Instructions: Moisten collagen with saline or hydrogel Secondary Dressing: ABD Pad, 8x10 1 x Per Week/30 Days Discharge Instructions: Apply over primary dressing as directed. Secondary Dressing: Woven Gauze Sponge, Non-Sterile 4x4 in 1 x Per Week/30 Days Discharge Instructions: Apply over primary dressing as directed. Secondary Dressing: Zetuvit Plus 4x8 in 1 x Per Week/30 Days Discharge Instructions: Apply over primary dressing as directed. Secured With: [employer], 4.5x3.1 (in/yd) 1 x Per  Week/30 Days Discharge Instructions: Secure with Kerlix as directed. Secured With: 63M Medipore H Soft Cloth Surgical T ape, 4 x 10 (in/yd) 1 x Per Week/30 Days Discharge Instructions: Secure with tape as directed. Secured With: total contact cast size 4 1 x Per Week/30 Days Discharge Instructions: last layer applied by provider. 1. T contact cast placed in standard fashion to the right lower extremity otal 2. In office sharp debridement 3. Collagen with antibiotic ointment 4. Follow-up in 1 week Electronic Signature(s) Signed: 02/03/2023 3:23:55 PM By: Geralyn Corwin DO Entered By: Geralyn Corwin on 02/03/2023 14:59:46 -------------------------------------------------------------------------------- Total Contact Cast Details Patient Name: Date of Service: [name] Pereyra, [name] 02/03/2023 1:15 PM Medical Record Number: [id number] Patient Account Number: [id number] Date of Birth/Sex: Treating RN: 18-May-1980 (43 y.o. Ginette Pitman Primary Care Provider: Gwinda Passe Other Clinician: Referring Provider: Treating Provider/Extender: Grace Isaac in Treatment: [address] Sharol Harness ([id number]) [id number] Page 11 of 11 T Contact Cast Applied for Wound Assessment: otal Wound #4 Right Amputation Site - Transmetatarsal Performed By: Physician Geralyn Corwin, DO The following information was scribed by: Midge Aver The information was scribed for: Geralyn Corwin Post Procedure Diagnosis Same as Pre-procedure Electronic Signature(s) Signed: 02/03/2023 3:23:55 PM By: Geralyn Corwin DO Signed: 02/03/2023 4:09:14 PM By: Midge Aver MSN RN CNS WTA Entered By: Midge Aver on 02/03/2023 14:13:40 -------------------------------------------------------------------------------- SuperBill Details Patient Name: Date of Service: [name], [name] 02/03/2023 Medical Record Number: [id number] Patient Account Number: [id number] Date of  Birth/Sex: Treating RN: 20-Jul-1980 (43 y.o. 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

## 2023-02-04 NOTE — Progress Notes (Signed)
[name], [name] ([id number]) [id number] Page 1 of 10 Visit Report for 02/03/2023 Arrival Information Details Patient Name: Date of Service: [name], [place] [name] 02/03/2023 1:15 PM Medical Record Number: [id number] Patient Account Number: [id number] Date of Birth/Sex: Treating RN: [birth year] (43 y.o. [name], Millard.Loa Primary Care Charlye Spare: Gwinda Passe Other Clinician: Referring Leopoldo Mazzie: Treating Spirit Wernli/Extender: Grace Isaac in Treatment: 25 Visit Information History Since Last Visit Added or deleted any medications: No Patient Arrived: Walker Any new allergies or adverse reactions: No Arrival Time: 13:34 Had a fall or experienced change in No Accompanied By: self activities of daily living that may affect Transfer Assistance: None risk of falls: Patient Identification Verified: Yes Signs or symptoms of abuse/neglect since last visito No Secondary Verification Process Completed: Yes Hospitalized since last visit: No Patient Requires Transmission-Based Precautions: No Implantable device outside of the clinic excluding No Patient Has Alerts: Yes cellular tissue based products placed in the center Patient Alerts: Patient on Blood Thinner since last visit: Eliquis Has Dressing in Place as Prescribed: Yes ABI R 1.02 (08/21/22) Has Footwear/Offloading in Place as Prescribed: Yes ABI L 0.99 (08/21/22) Right: Wedge Shoe Pain Present Now: No Electronic Signature(s) Signed: 02/03/2023 1:58:15 PM By: Midge Aver MSN RN CNS WTA Previous Signature: 02/03/2023 1:46:09 PM Version By: Midge Aver MSN RN CNS WTA Entered By: Midge Aver on 02/03/2023 13:58:15 -------------------------------------------------------------------------------- Clinic Level of Care Assessment Details Patient Name: Date of Service: [name], [name] 02/03/2023 1:15 PM Medical Record Number: [id number] Patient Account Number: [id number] Date of Birth/Sex:  Treating RN: 1980-04-20 (43 y.o. [name] Primary Care Wyland Rastetter: Gwinda Passe Other Clinician: Referring Anjoli Diemer: Treating Betsaida [place]/Extender: Grace Isaac in Treatment: 25 Clinic Level of Care Assessment Items TOOL 1 Quantity Score []  - 0 Use when EandM and Procedure is performed on INITIAL visit ASSESSMENTS - Nursing Assessment / Reassessment []  - 0 General Physical Exam (combine w/ comprehensive assessment (listed just below) when performed on new pt. evals) []  - 0 Comprehensive Assessment (HX, ROS, Risk Assessments, Wounds Hx, etc.) ASSESSMENTS - Wound and Skin Assessment / Reassessment []  - 0 Dermatologic / Skin Assessment (not related to wound area) ASSESSMENTS - Ostomy and/or Continence Assessment and Care []  - 0 Incontinence Assessment and Management []  - 0 Ostomy Care Assessment and Management (repouching, etc.) PROCESS - Coordination of Care []  - 0 Simple Patient / Family Education for ongoing care TOPEKA, HAMBLETT ([id number]) [id number] Page 2 of 10 []  - 0 Complex (extensive) Patient / Family Education for ongoing care []  - 0 Staff obtains Chiropractor, Records, T Results / Process Orders est []  - 0 Staff telephones HHA, Nursing Homes / Clarify orders / etc []  - 0 Routine Transfer to another Facility (non-emergent condition) []  - 0 Routine Hospital Admission (non-emergent condition) []  - 0 New Admissions / Manufacturing engineer / Ordering NPWT Apligraf, etc. , []  - 0 Emergency Hospital Admission (emergent condition) PROCESS - Special Needs []  - 0 Pediatric / Minor Patient Management []  - 0 Isolation Patient Management []  - 0 Hearing / Language / Visual special needs []  - 0 Assessment of Community assistance (transportation, D/C planning, etc.) []  - 0 Additional assistance / Altered mentation []  - 0 Support Surface(s) Assessment (bed, cushion, seat, etc.) INTERVENTIONS -  Miscellaneous []  - 0 External ear exam []  - 0 Patient Transfer (multiple staff / Nurse, adult / Similar devices) []  - 0 Simple Staple / Suture removal (25 or less) []  - 0 Complex  Staple / Suture removal (26 or more) []  - 0 Hypo/Hyperglycemic Management (do not check if billed separately) []  - 0 Ankle / Brachial Index (ABI) - do not check if billed separately Has the patient been seen at the hospital within the last three years: Yes Total Score: 0 Level Of Care: ____ Electronic Signature(s) Signed: 02/03/2023 4:09:14 PM By: Midge Aver MSN RN CNS WTA Entered By: Midge Aver on 02/03/2023 14:37:14 -------------------------------------------------------------------------------- Encounter Discharge Information Details Patient Name: Date of Service: [name], [name] 02/03/2023 1:15 PM Medical Record Number: [id number] Patient Account Number: [id number] Date of Birth/Sex: Treating RN: 1980-12-16 (43 y.o. [name] Primary Care Dequavius Kuhner: Gwinda Passe Other Clinician: Referring Tali Coster: Treating Bekah Igoe/Extender: Grace Isaac in Treatment: 25 Encounter Discharge Information Items Post Procedure Vitals Discharge Condition: Stable Temperature (F): 98.5 Ambulatory Status: Ambulatory Pulse (bpm): 84 Discharge Destination: Home Respiratory Rate (breaths/min): 18 Transportation: Private Auto Blood Pressure (mmHg): 115/75 Accompanied By: self Schedule Follow-up Appointment: Yes Clinical Summary of Care: Electronic Signature(s) Signed: 02/03/2023 4:09:14 PM By: Midge Aver MSN RN CNS WTA Entered By: Midge Aver on 02/03/2023 14:38:16 Hoel, Sharol Harness ([id number]) [id number] Page 3 of 10 -------------------------------------------------------------------------------- Lower Extremity Assessment Details Patient Name: Date of Service: [name] Pereyra, [name] 02/03/2023 1:15 PM Medical Record Number: [id number] Patient Account  Number: [id number] Date of Birth/Sex: Treating RN: 01-29-1980 (43 y.o. [name] Primary Care Jurrell Royster: Gwinda Passe Other Clinician: Referring Vena Bassinger: Treating Annalisse Minkoff/Extender: Tobie Poet Weeks in Treatment: 25 Edema Assessment Assessed: [Left: No] [Right: No] Edema: [Left: Ye] [Right: s] Calf Left: Right: Point of Measurement: 31 cm From Medial Instep 50.5 cm Ankle Left: Right: Point of Measurement: 9 cm From Medial Instep 29 cm Vascular Assessment Extremity colors, hair growth, and conditions: Extremity Color: [Right:Hyperpigmented] Hair Growth on Extremity: [Right:No] Temperature of Extremity: [Right:Warm] Capillary Refill: [Right:< 3 seconds] Dependent Rubor: [Right:No Yes] Electronic Signature(s) Signed: 02/03/2023 2:44:18 PM By: Shawn Stall RN, BSN Entered By: Shawn Stall on 02/03/2023 13:53:06 -------------------------------------------------------------------------------- Multi Wound Chart Details Patient Name: Date of Service: [name], [name] 02/03/2023 1:15 PM Medical Record Number: [id number] Patient Account Number: [id number] Date of Birth/Sex: Treating RN: 01-04-1981 (43 y.o. F) Primary Care Saesha Llerenas: Gwinda Passe Other Clinician: Referring Jacobey Gura: Treating Kyrstan Gotwalt/Extender: Grace Isaac in Treatment: 25 Vital Signs Height(in): 69 Pulse(bpm): 84 Weight(lbs): 330 Blood Pressure(mmHg): 115/75 Body Mass Index(BMI): 48.7 Temperature(F): 98.5 Respiratory Rate(breaths/min): 18 [4:Photos:] [N/A:N/A] Right Amputation Site - N/A N/A Wound Location: Transmetatarsal Shear/Friction N/A N/A Wounding Event: Diabetic Wound/Ulcer of the Lower N/A N/A Primary Etiology: Extremity Dehisced Wound N/A N/A Secondary Etiology: Deep Vein Thrombosis, Hypertension, N/A N/A Comorbid History: Type II Diabetes, Osteomyelitis, Neuropathy 07/08/2022 N/A N/A Date Acquired: 25 N/A N/A Weeks of  Treatment: Open N/A N/A Wound Status: No N/A N/A Wound Recurrence: 1x0.5x0.6 N/A N/A Measurements L x W x D (cm) 0.393 N/A N/A A (cm) : rea 0.236 N/A N/A Volume (cm) : -78.60% N/A N/A % Reduction in A rea: -34.10% N/A N/A % Reduction in Volume: Grade 3 N/A N/A Classification: Large N/A N/A Exudate A mount: Serosanguineous N/A N/A Exudate Type: red, brown N/A N/A Exudate Color: Thickened N/A N/A Wound Margin: Large (67-100%) N/A N/A Granulation A mount: Red, Pink N/A N/A Granulation Quality: Small (1-33%) N/A N/A Necrotic A mount: Fat Layer (Subcutaneous Tissue): Yes N/A N/A Exposed Structures: Fascia: No Tendon: No Muscle: No Joint: No Bone: No Small (1-33%) N/A N/A Epithelialization: Debridement -  Excisional N/A N/A Debridement: Pre-procedure Verification/Time Out 14:12 N/A N/A Taken: Lidocaine 4% Topical Solution N/A N/A Pain Control: Callus, Subcutaneous, Slough N/A N/A Tissue Debrided: Skin/Subcutaneous Tissue N/A N/A Level: 0.39 N/A N/A Debridement A (sq cm): rea Curette N/A N/A Instrument: Minimum N/A N/A Bleeding: Pressure N/A N/A Hemostasis A chieved: 0 N/A N/A Procedural Pain: 0 N/A N/A Post Procedural Pain: Procedure was tolerated well N/A N/A Debridement Treatment Response: 1x0.5x0.5 N/A N/A Post Debridement Measurements L x W x D (cm) 0.196 N/A N/A Post Debridement Volume: (cm) Callus: Yes N/A N/A Periwound Skin Texture: Excoriation: No Induration: No Crepitus: No Rash: No Scarring: No Maceration: Yes N/A N/A Periwound Skin Moisture: Dry/Scaly: No Atrophie Blanche: No N/A N/A Periwound Skin Color: Cyanosis: No Ecchymosis: No Erythema: No Hemosiderin Staining: No Mottled: No Pallor: No Rubor: No No Abnormality N/A N/A Temperature: Yes N/A N/A Tenderness on Palpation: Debridement N/A N/A Procedures Performed: T Contact Cast otal Treatment Notes Wound #4 (Amputation Site - Transmetatarsal) Wound Laterality:  Right Cleanser Soap and Water Discharge Instruction: May shower and wash wound with dial antibacterial soap and water prior to dressing change. On days not changing the dressing, please keep wound/dressing dry Lafortune, Aki L ([id number]) [id number] Page 5 of 10 Peri-Wound Care Topical Gentamicin Discharge Instruction: As directed by physician Primary Dressing Promogran Prisma Matrix, 4.34 (sq in) (silver collagen) Discharge Instruction: Moisten collagen with saline or hydrogel Secondary Dressing ABD Pad, 8x10 Discharge Instruction: Apply over primary dressing as directed. Woven Gauze Sponge, Non-Sterile 4x4 in Discharge Instruction: Apply over primary dressing as directed. Zetuvit Plus 4x8 in Discharge Instruction: Apply over primary dressing as directed. Secured With [employer], 4.5x3.1 (in/yd) Discharge Instruction: Secure with Kerlix as directed. 57M Medipore H Soft Cloth Surgical T ape, 4 x 10 (in/yd) Discharge Instruction: Secure with tape as directed. total contact cast size 4 Discharge Instruction: last layer applied by Elai Vanwyk. Compression Wrap Compression Stockings Add-Ons Electronic Signature(s) Signed: 02/03/2023 3:23:55 PM By: Geralyn Corwin DO Entered By: Geralyn Corwin on 02/03/2023 14:53:31 -------------------------------------------------------------------------------- Multi-Disciplinary Care Plan Details Patient Name: Date of Service: [name], [name] 02/03/2023 1:15 PM Medical Record Number: [id number] Patient Account Number: [id number] Date of Birth/Sex: Treating RN: 1980-11-13 (43 y.o. [name] Primary Care Sabrina Keough: Gwinda Passe Other Clinician: Referring Dalbert Stillings: Treating Makaylyn Sinyard/Extender: Grace Isaac in Treatment: 25 Active Inactive Nutrition Nursing Diagnoses: Impaired glucose control: actual or potential Goals: Patient/caregiver agrees to and verbalizes  understanding of need to obtain nutritional consultation Date Initiated: 08/08/2022 Date Inactivated: 12/31/2022 Target Resolution Date: 01/13/2024 Goal Status: Met Patient/caregiver will maintain therapeutic glucose control Date Initiated: 08/08/2022 Target Resolution Date: 01/13/2024 Goal Status: Active Interventions: Assess HgA1c results as ordered upon admission and as needed DAYANAH, DJURIC L ([id number]) [id number] Page 6 of 10 Provide education on elevated blood sugars and impact on wound healing Provide education on nutrition Treatment Activities: Obtain HgA1c : 08/08/2022 Patient referred to Primary Care Physician for further nutritional evaluation : 08/08/2022 Notes: Pain, Acute or Chronic Nursing Diagnoses: Pain, acute or chronic: actual or potential Potential alteration in comfort, pain Goals: Patient will verbalize adequate pain control and receive pain control interventions during procedures as needed Date Initiated: 08/08/2022 Date Inactivated: 12/31/2022 Target Resolution Date: 01/13/2024 Goal Status: Met Patient/caregiver will verbalize comfort level met Date Initiated: 08/08/2022 Target Resolution Date: 01/13/2024 Goal Status: Active Interventions: Complete pain assessment as per visit requirements Encourage patient to take pain medications as prescribed Provide education on pain management Treatment Activities: Administer pain  control measures as ordered : 08/08/2022 Notes: Wound/Skin Impairment Nursing Diagnoses: Knowledge deficit related to ulceration/compromised skin integrity Goals: Patient/caregiver will verbalize understanding of skin care regimen Date Initiated: 08/08/2022 Date Inactivated: 12/31/2022 Target Resolution Date: 01/13/2024 Goal Status: Met Ulcer/skin breakdown will heal within 14 weeks Date Initiated: 08/08/2022 Target Resolution Date: 01/13/2024 Goal Status: Active Interventions: Assess patient/caregiver  ability to perform ulcer/skin care regimen upon admission and as needed Assess ulceration(s) every visit Provide education on ulcer and skin care Treatment Activities: Skin care regimen initiated : 08/08/2022 Topical wound management initiated : 08/08/2022 Notes: Electronic Signature(s) Signed: 02/03/2023 2:44:18 PM By: Shawn Stall RN, BSN Signed: 02/03/2023 4:09:14 PM By: Midge Aver MSN RN CNS WTA Entered By: Midge Aver on 02/03/2023 14:36:40 -------------------------------------------------------------------------------- Pain Assessment Details Patient Name: Date of Service: [name] Pereyra, [name] 02/03/2023 1:15 PM Medical Record Number: [id number] Patient Account Number: [id number] Date of Birth/Sex: Treating RN: [birth year] (43 y.o. [name] Primary Care Schwanda Zima: Gwinda Passe Other Clinician: Referring Roma Bondar: Treating Matilde Pottenger/Extender: Tobie Poet [place], [place] Elbert Ewings ([id number]) [id number] Page 7 of 10 Weeks in Treatment: 25 Active Problems Location of Pain Severity and Description of Pain Patient Has Paino No Site Locations Pain Management and Medication Current Pain Management: Electronic Signature(s) Signed: 02/03/2023 2:44:18 PM By: Shawn Stall RN, BSN Entered By: Shawn Stall on 02/03/2023 13:34:35 -------------------------------------------------------------------------------- Patient/Caregiver Education Details Patient Name: Date of Service: [name], [name] 1/20/2025andnbsp1:15 PM Medical Record Number: [id number] Patient Account Number: [id number] Date of Birth/Gender: Treating RN: 29-May-1980 (43 y.o. [name] Primary Care Physician: Gwinda Passe Other Clinician: Referring Physician: Treating Physician/Extender: Grace Isaac in Treatment: 25 Education Assessment Education Provided To: Patient Education Topics Provided Elevated Blood Sugar/ Impact on  Healing: Handouts: Elevated Blood Sugars: How Do They Affect Wound Healing Methods: Explain/Verbal Responses: Reinforcements needed Wound/Skin Impairment: Handouts: Caring for Your Ulcer Methods: Explain/Verbal Responses: State content correctly Electronic Signature(s) Signed: 02/03/2023 4:09:14 PM By: Midge Aver MSN RN CNS WTA Entered By: Midge Aver on 02/03/2023 14:37:01 Fulbright, Sharol Harness ([id number]) [id number] Page 8 of 10 -------------------------------------------------------------------------------- Wound Assessment Details Patient Name: Date of Service: [name] Pereyra, [name] 02/03/2023 1:15 PM Medical Record Number: [id number] Patient Account Number: [id number] Date of Birth/Sex: Treating RN: 1980/06/19 (43 y.o. 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 Secondary Dehisced Wound Wounding Event: Shear/Friction Etiology: Date Acquired: 07/08/2022 Wound Status: Open Weeks Of Treatment: 25 Comorbid Deep Vein Thrombosis, Hypertension, Type II Diabetes, Clustered Wound: No History: Osteomyelitis, Neuropathy Photos Wound Measurements Length: (cm) 1 Width: (cm) 0.5 Depth: (cm) 0.6 Area: (cm) 0.393 Volume: (cm) 0.236 % Reduction in Area: -78.6% % Reduction in Volume: -34.1% Epithelialization: Small (1-33%) Tunneling: No Undermining: No Wound Description Classification: Grade 3 Wound Margin: Thickened Exudate Amount: Large Exudate Type: Serosanguineous Exudate Color: red, brown Foul Odor After Cleansing: No Slough/Fibrino Yes Wound Bed Granulation Amount: Large (67-100%) Exposed Structure Granulation Quality: Red, Pink Fascia Exposed: No Necrotic Amount:  Small (1-33%) Fat Layer (Subcutaneous Tissue) Exposed: Yes Tendon Exposed: No Muscle Exposed: No Joint Exposed: No Bone Exposed: No Periwound Skin Texture Texture Color No Abnormalities Noted: No No Abnormalities Noted: Yes Callus: Yes Temperature / Pain Crepitus: No Temperature: No Abnormality Excoriation: No Tenderness on Palpation: Yes Induration: No Rash: No Scarring:  No Moisture No Abnormalities Noted: No Dry / Scaly: No MacerationHadeel Mensinger, Xian L ([id number]) [id number] Page 9 of 10 Treatment Notes Wound #4 (Amputation Site - Transmetatarsal) Wound Laterality: Right Cleanser Soap and Water Discharge Instruction: May shower and wash wound with dial antibacterial soap and water prior to dressing change. On days not changing the dressing, please keep wound/dressing dry Peri-Wound Care Topical Gentamicin Discharge Instruction: As directed by physician Primary Dressing Promogran Prisma Matrix, 4.34 (sq in) (silver collagen) Discharge Instruction: Moisten collagen with saline or hydrogel Secondary Dressing ABD Pad, 8x10 Discharge Instruction: Apply over primary dressing as directed. Woven Gauze Sponge, Non-Sterile 4x4 in Discharge Instruction: Apply over primary dressing as directed. Zetuvit Plus 4x8 in Discharge Instruction: Apply over primary dressing as directed. Secured With [employer], 4.5x3.1 (in/yd) Discharge Instruction: Secure with Kerlix as directed. 41M Medipore H Soft Cloth Surgical T ape, 4 x 10 (in/yd) Discharge Instruction: Secure with tape as directed. total contact cast size 4 Discharge Instruction: last layer applied by Marvia Troost. Compression Wrap Compression Stockings Add-Ons Electronic Signature(s) Signed: 02/03/2023 2:44:18 PM By: Shawn Stall RN, BSN Signed: 02/04/2023 4:04:36 PM By: Thayer Dallas Entered By: Thayer Dallas on 02/03/2023  13:55:07 -------------------------------------------------------------------------------- Vitals Details Patient Name: Date of Service: [name], [name] 02/03/2023 1:15 PM Medical Record Number: [id number] Patient Account Number: [id number] Date of Birth/Sex: Treating RN: 02-14-1980 (43 y.o. [name], [name] Primary Care Genesys Coggeshall: Gwinda Passe Other Clinician: Referring Kanyon Seibold: Treating Mats Jeanlouis/Extender: Grace Isaac in Treatment: 25 Vital Signs Time Taken: 14:01 Temperature (F): 98.5 Height (in): 69 Pulse (bpm): 84 Weight (lbs): 330 Respiratory Rate (breaths/min): 18 Body Mass Index (BMI): 48.7 Blood Pressure (mmHg): 115/75 Reference Range: 80 - 120 mg / dl Electronic Signature(s) Mahnke, Tamatha L ([id number]) [id number] Page 10 of 10 Signed: 02/03/2023 2:44:18 PM By: Shawn Stall RN, BSN Entered By: Shawn Stall on 02/03/2023 14:02:58

## 2023-02-10 ENCOUNTER — Encounter (HOSPITAL_BASED_OUTPATIENT_CLINIC_OR_DEPARTMENT_OTHER): Payer: Medicaid Other | Admitting: Internal Medicine

## 2023-02-10 DIAGNOSIS — M86671 Other chronic osteomyelitis, right ankle and foot: Secondary | ICD-10-CM

## 2023-02-10 DIAGNOSIS — E11621 Type 2 diabetes mellitus with foot ulcer: Secondary | ICD-10-CM | POA: Diagnosis not present

## 2023-02-10 DIAGNOSIS — L97512 Non-pressure chronic ulcer of other part of right foot with fat layer exposed: Secondary | ICD-10-CM

## 2023-02-17 ENCOUNTER — Encounter (HOSPITAL_BASED_OUTPATIENT_CLINIC_OR_DEPARTMENT_OTHER): Payer: Medicaid Other | Attending: Internal Medicine | Admitting: Internal Medicine

## 2023-02-17 DIAGNOSIS — E11621 Type 2 diabetes mellitus with foot ulcer: Secondary | ICD-10-CM | POA: Diagnosis not present

## 2023-02-17 DIAGNOSIS — M86671 Other chronic osteomyelitis, right ankle and foot: Secondary | ICD-10-CM | POA: Diagnosis not present

## 2023-02-17 DIAGNOSIS — L97512 Non-pressure chronic ulcer of other part of right foot with fat layer exposed: Secondary | ICD-10-CM | POA: Diagnosis not present

## 2023-02-20 NOTE — Telephone Encounter (Signed)
 error

## 2023-02-24 ENCOUNTER — Encounter (HOSPITAL_BASED_OUTPATIENT_CLINIC_OR_DEPARTMENT_OTHER): Payer: Medicaid Other | Admitting: Internal Medicine

## 2023-02-24 DIAGNOSIS — L97512 Non-pressure chronic ulcer of other part of right foot with fat layer exposed: Secondary | ICD-10-CM

## 2023-02-24 DIAGNOSIS — E11621 Type 2 diabetes mellitus with foot ulcer: Secondary | ICD-10-CM | POA: Diagnosis not present

## 2023-03-03 ENCOUNTER — Encounter (HOSPITAL_BASED_OUTPATIENT_CLINIC_OR_DEPARTMENT_OTHER): Payer: Medicaid Other | Admitting: Internal Medicine

## 2023-03-03 DIAGNOSIS — L97512 Non-pressure chronic ulcer of other part of right foot with fat layer exposed: Secondary | ICD-10-CM | POA: Diagnosis not present

## 2023-03-03 DIAGNOSIS — E11621 Type 2 diabetes mellitus with foot ulcer: Secondary | ICD-10-CM

## 2023-03-10 ENCOUNTER — Encounter (HOSPITAL_BASED_OUTPATIENT_CLINIC_OR_DEPARTMENT_OTHER): Payer: Medicaid Other | Admitting: Internal Medicine

## 2023-03-10 DIAGNOSIS — L97512 Non-pressure chronic ulcer of other part of right foot with fat layer exposed: Secondary | ICD-10-CM

## 2023-03-10 DIAGNOSIS — E11621 Type 2 diabetes mellitus with foot ulcer: Secondary | ICD-10-CM | POA: Diagnosis not present

## 2023-03-11 ENCOUNTER — Telehealth (INDEPENDENT_AMBULATORY_CARE_PROVIDER_SITE_OTHER): Payer: Self-pay | Admitting: Primary Care

## 2023-03-11 NOTE — Telephone Encounter (Signed)
 Spoke to pt about atp.. Will be present

## 2023-03-15 ENCOUNTER — Other Ambulatory Visit: Payer: Self-pay | Admitting: Primary Care

## 2023-03-16 MED ORDER — LANTUS SOLOSTAR 100 UNIT/ML ~~LOC~~ SOPN
70.0000 [IU] | PEN_INJECTOR | Freq: Every day | SUBCUTANEOUS | 3 refills | Status: DC
  Filled 2023-03-16: qty 15, 21d supply, fill #0
  Filled 2023-05-19: qty 15, 21d supply, fill #1
  Filled 2023-07-03: qty 15, 21d supply, fill #2

## 2023-03-17 ENCOUNTER — Encounter (HOSPITAL_BASED_OUTPATIENT_CLINIC_OR_DEPARTMENT_OTHER): Payer: Medicaid Other | Attending: Internal Medicine | Admitting: Internal Medicine

## 2023-03-17 ENCOUNTER — Other Ambulatory Visit: Payer: Self-pay

## 2023-03-17 ENCOUNTER — Telehealth (INDEPENDENT_AMBULATORY_CARE_PROVIDER_SITE_OTHER): Payer: Self-pay | Admitting: Primary Care

## 2023-03-17 DIAGNOSIS — M86671 Other chronic osteomyelitis, right ankle and foot: Secondary | ICD-10-CM | POA: Diagnosis not present

## 2023-03-17 DIAGNOSIS — L97512 Non-pressure chronic ulcer of other part of right foot with fat layer exposed: Secondary | ICD-10-CM | POA: Diagnosis not present

## 2023-03-17 DIAGNOSIS — E11621 Type 2 diabetes mellitus with foot ulcer: Secondary | ICD-10-CM | POA: Diagnosis present

## 2023-03-17 NOTE — Telephone Encounter (Signed)
 Called pt to remind them about atp. Pt will be present

## 2023-03-18 ENCOUNTER — Ambulatory Visit (INDEPENDENT_AMBULATORY_CARE_PROVIDER_SITE_OTHER): Payer: Medicaid Other | Admitting: Primary Care

## 2023-03-18 ENCOUNTER — Other Ambulatory Visit: Payer: Self-pay

## 2023-03-18 DIAGNOSIS — Z7985 Long-term (current) use of injectable non-insulin antidiabetic drugs: Secondary | ICD-10-CM | POA: Diagnosis not present

## 2023-03-18 DIAGNOSIS — Z7984 Long term (current) use of oral hypoglycemic drugs: Secondary | ICD-10-CM

## 2023-03-18 DIAGNOSIS — Z760 Encounter for issue of repeat prescription: Secondary | ICD-10-CM

## 2023-03-18 DIAGNOSIS — E1165 Type 2 diabetes mellitus with hyperglycemia: Secondary | ICD-10-CM | POA: Diagnosis not present

## 2023-03-18 DIAGNOSIS — I10 Essential (primary) hypertension: Secondary | ICD-10-CM | POA: Diagnosis not present

## 2023-03-18 DIAGNOSIS — E669 Obesity, unspecified: Secondary | ICD-10-CM | POA: Diagnosis not present

## 2023-03-18 DIAGNOSIS — Z794 Long term (current) use of insulin: Secondary | ICD-10-CM

## 2023-03-18 LAB — POCT GLYCOSYLATED HEMOGLOBIN (HGB A1C): HbA1c, POC (controlled diabetic range): 10.7 % — AB (ref 0.0–7.0)

## 2023-03-18 MED ORDER — LISINOPRIL 2.5 MG PO TABS
2.5000 mg | ORAL_TABLET | Freq: Every day | ORAL | 1 refills | Status: AC
  Filled 2023-03-18 – 2023-03-19 (×3): qty 90, 90d supply, fill #0
  Filled 2023-07-03: qty 90, 90d supply, fill #1

## 2023-03-18 MED ORDER — TRULICITY 1.5 MG/0.5ML ~~LOC~~ SOAJ
1.5000 mg | SUBCUTANEOUS | 1 refills | Status: DC
  Filled 2023-05-19: qty 2, 28d supply, fill #0
  Filled 2023-07-03: qty 2, 28d supply, fill #1

## 2023-03-18 MED ORDER — METFORMIN HCL ER 500 MG PO TB24
1000.0000 mg | ORAL_TABLET | Freq: Two times a day (BID) | ORAL | 2 refills | Status: AC
  Filled 2023-07-03: qty 120, 30d supply, fill #0
  Filled 2024-02-17: qty 120, 30d supply, fill #1

## 2023-03-18 MED ORDER — GABAPENTIN 300 MG PO CAPS
600.0000 mg | ORAL_CAPSULE | Freq: Three times a day (TID) | ORAL | 2 refills | Status: AC
  Filled 2023-07-03: qty 180, 30d supply, fill #0
  Filled 2024-02-17: qty 180, 30d supply, fill #1

## 2023-03-19 ENCOUNTER — Other Ambulatory Visit: Payer: Self-pay

## 2023-03-19 ENCOUNTER — Other Ambulatory Visit (HOSPITAL_COMMUNITY): Payer: Self-pay

## 2023-03-19 ENCOUNTER — Encounter (INDEPENDENT_AMBULATORY_CARE_PROVIDER_SITE_OTHER): Payer: Self-pay | Admitting: Primary Care

## 2023-03-19 LAB — CMP14+EGFR

## 2023-03-19 LAB — MICROALBUMIN / CREATININE URINE RATIO
Creatinine, Urine: 75 mg/dL
Microalb/Creat Ratio: 4 mg/g{creat} (ref 0–29)
Microalbumin, Urine: 3.2 ug/mL

## 2023-03-19 NOTE — Progress Notes (Signed)
 [facility]  [name], is a 43 y.o. female  OZH:[id number]  GEX:[id number]  DOB - 04/19/1980  Chief Complaint  Patient presents with   Follow-up       Subjective:   [name] is a 43 y.o. female here today for a follow up visit T2D. Denies polyuria, polydipsia, polyphasia or vision changes.  Does not check blood sugars at home.  Patient has No headache, No chest pain, No abdominal pain - No Nausea, No new weakness tingling or numbness, No Cough - shortness of breath HPI  No problems updated.  Comprehensive ROS Pertinent positive and negative noted in HPI   No Known Allergies  Past Medical History:  Diagnosis Date   Class 3 obesity 12/16/2020   Diabetes mellitus     Current Outpatient Medications on File Prior to Visit  Medication Sig Dispense Refill   apixaban (ELIQUIS) 5 MG TABS tablet Take 1 tablet (5mg ) by mouth twice daily. 180 tablet 1   Blood Glucose Monitoring Suppl (TRUE METRIX METER) w/Device KIT Use to check blood sugar twice a day. 1 kit 0   gentamicin ointment (GARAMYCIN) 0.1 % Apply 1 Application topically daily. 30 g 0   glucose blood (TRUE METRIX BLOOD GLUCOSE TEST) test strip Use to check blood sugar twice a day. 100 each 2   insulin glargine (LANTUS SOLOSTAR) 100 UNIT/ML Solostar Pen Inject 70 Units into the skin daily. 15 mL 3   Insulin Pen Needle 32G X 4 MM MISC use as directed 100 each 2   oxyCODONE (OXY IR/ROXICODONE) 5 MG immediate release tablet Take 5 mg by mouth every 6 (six) hours as needed for moderate pain or severe pain.     sodium hypochlorite (DAKIN'S 1/2 STRENGTH) external solution Use as directed for wet to dry dressings daily to wound. 473 mL 2   TRUEplus Lancets 28G MISC Use to check blood sugar twice a day. 100 each 2   doxycycline (VIBRA-TABS) 100 MG tablet Take 1 tablet (100 mg total) by mouth 2 (two) times daily for 14 day.s (Patient not taking: Reported on 03/18/2023) 28 tablet 0   No current facility-administered  medications on file prior to visit.   Health Maintenance  Topic Date Due   Complete foot exam   Never done   Eye exam for diabetics  Never done   DTaP/Tdap/Td vaccine (1 - Tdap) Never done   COVID-19 Vaccine (1 - 2024-25 season) Never done   Yearly kidney function blood test for diabetes  01/31/2023   Flu Shot  04/14/2023*   Pap with HPV screening  03/17/2024*   Pneumococcal Vaccination (1 of 2 - PCV) 03/17/2024*   Hemoglobin A1C  09/18/2023   Yearly kidney health urinalysis for diabetes  03/17/2024   Hepatitis C Screening  Completed   HIV Screening  Completed   HPV Vaccine  Aged Out  *Topic was postponed. The date shown is not the original due date.    Objective:  There were no vitals filed for this visit.   Physical Exam Vitals reviewed.  Constitutional:      Appearance: Normal appearance. She is obese.  HENT:     Head: Normocephalic.     Right Ear: Tympanic membrane, ear canal and external ear normal.     Left Ear: Tympanic membrane, ear canal and external ear normal.     Nose: Nose normal.     Mouth/Throat:     Mouth: Mucous membranes are moist.  Eyes:     Extraocular Movements:  Extraocular movements intact.     Pupils: Pupils are equal, round, and reactive to light.  Cardiovascular:     Rate and Rhythm: Normal rate.  Pulmonary:     Effort: Pulmonary effort is normal.     Breath sounds: Normal breath sounds.  Abdominal:     General: Bowel sounds are normal.     Palpations: Abdomen is soft.  Musculoskeletal:     Cervical back: Normal range of motion.     Comments: Wheel chair  Right leg BKA  Skin:    General: Skin is warm and dry.  Neurological:     Mental Status: She is alert and oriented to person, place, and time.  Psychiatric:        Mood and Affect: Mood normal.        Behavior: Behavior normal.        Thought Content: Thought content normal.       Assessment & Plan  Type 2 diabetes mellitus with hyperglycemia, unspecified whether long term  insulin use (HCC) - educated on lifestyle modifications, including but not limited to diet choices and adding exercise to daily routine.   -     Microalbumin / creatinine urine ratio -     metFORMIN HCl ER; Take 2 tablets (1,000 mg total) by mouth 2 (two) times daily.  Dispense: 120 tablet; Refill: 2 -     Gabapentin; Take 2 capsules by mouth 3 times daily.  Dispense: 180 capsule; Refill: 2 -     Trulicity; Inject 1.5 mg into the skin once a week.  Dispense: 6 mL; Refill: 1 -     POCT glycosylated hemoglobin (Hb A1C)  Medication refill -     metFORMIN HCl ER; Take 2 tablets (1,000 mg total) by mouth 2 (two) times daily.  Dispense: 120 tablet; Refill: 2 -     Lisinopril; Take 1 tablet (2.5 mg total) by mouth daily.  Dispense: 90 tablet; Refill: 1  Essential hypertension BP goal - < 130/80 Explained that having normal blood pressure is the goal and medications are helping to get to goal and maintain normal blood pressure. DIET: Limit salt intake, read nutrition labels to check salt content, limit fried and high fatty foods  Avoid using multisymptom OTC cold preparations that generally contain sudafed which can rise BP. Consult with pharmacist on best cold relief products to use for persons with HTN EXERCISE Discussed incorporating exercise such as walking - 30 minutes most days of the week and can do in 10 minute intervals    -     CMP14+EGFR -     Lisinopril; Take 1 tablet (2.5 mg total) by mouth daily.  Dispense: 90 tablet; Refill: 1     Patient have been counseled extensively about nutrition and exercise. Other issues discussed during this visit include: low cholesterol diet, weight control and daily exercise, foot care, annual eye examinations at Ophthalmology, importance of adherence with medications and regular follow-up. We also discussed long term complications of uncontrolled diabetes and hypertension.   Return in about 3 months (around 06/18/2023) for DM.  The patient was given  clear instructions to go to ER or return to medical center if symptoms don't improve, worsen or new problems develop. The patient verbalized understanding. The patient was told to call to get lab results if they haven't heard anything in the next week.   This note has been created with Education officer, environmental. Any transcriptional errors are unintentional.  Grayce Sessions, NP 03/19/2023, 9:12 PM

## 2023-03-20 ENCOUNTER — Other Ambulatory Visit: Payer: Self-pay

## 2023-03-20 ENCOUNTER — Other Ambulatory Visit: Payer: Self-pay | Admitting: Primary Care

## 2023-03-20 ENCOUNTER — Other Ambulatory Visit (HOSPITAL_BASED_OUTPATIENT_CLINIC_OR_DEPARTMENT_OTHER): Payer: Self-pay

## 2023-03-20 DIAGNOSIS — Z794 Long term (current) use of insulin: Secondary | ICD-10-CM

## 2023-03-21 ENCOUNTER — Other Ambulatory Visit: Payer: Self-pay

## 2023-03-21 MED ORDER — TECHLITE PLUS PEN NEEDLES 32G X 4 MM MISC
2 refills | Status: DC
  Filled 2023-03-21: qty 100, 100d supply, fill #0
  Filled 2023-05-19 – 2023-07-03 (×2): qty 100, 100d supply, fill #1
  Filled 2023-10-19: qty 100, 100d supply, fill #2

## 2023-03-21 NOTE — Telephone Encounter (Signed)
 Requested medication (s) are due for refill today: yes  Requested medication (s) are on the active medication list: yes  Last refill:  10/09/21  Future visit scheduled: no  Notes to clinic: Unable to refill per protocol, last refill by another provider not at this practice. Routing to PCP for approval.     Requested Prescriptions  Pending Prescriptions Disp Refills   Insulin Pen Needle (TECHLITE PLUS PEN NEEDLES) 32G X 4 MM MISC 100 each 2    Sig: use as directed     Endocrinology: Diabetes - Testing Supplies Passed - 03/21/2023  8:44 AM      Passed - Valid encounter within last 12 months    Recent Outpatient Visits           3 days ago Type 2 diabetes mellitus with hyperglycemia, unspecified whether long term insulin use (HCC)   [place] [facility] Grayce Sessions, NP   3 months ago Type 2 diabetes mellitus with hyperglycemia, unspecified whether long term insulin use (HCC)   [place] [facility] Grayce Sessions, NP   6 months ago Moderate episode of recurrent major depressive disorder (HCC)   [place] [facility] Grayce Sessions, NP   1 year ago Type 2 diabetes mellitus with hyperglycemia, unspecified whether long term insulin use (HCC)   [place] [facility] Grayce Sessions, NP   1 year ago Type 2 diabetes mellitus with hyperglycemia, unspecified whether long term insulin use (HCC)   [facility] [place] Drucilla Chalet, RPH-CPP

## 2023-03-24 ENCOUNTER — Encounter (HOSPITAL_BASED_OUTPATIENT_CLINIC_OR_DEPARTMENT_OTHER): Payer: Medicaid Other | Admitting: Internal Medicine

## 2023-03-24 DIAGNOSIS — L97512 Non-pressure chronic ulcer of other part of right foot with fat layer exposed: Secondary | ICD-10-CM

## 2023-03-24 DIAGNOSIS — E11621 Type 2 diabetes mellitus with foot ulcer: Secondary | ICD-10-CM

## 2023-03-25 ENCOUNTER — Other Ambulatory Visit: Payer: Self-pay

## 2023-03-31 ENCOUNTER — Encounter (HOSPITAL_BASED_OUTPATIENT_CLINIC_OR_DEPARTMENT_OTHER): Payer: Medicaid Other | Admitting: Internal Medicine

## 2023-03-31 DIAGNOSIS — E11621 Type 2 diabetes mellitus with foot ulcer: Secondary | ICD-10-CM | POA: Diagnosis not present

## 2023-03-31 DIAGNOSIS — L97512 Non-pressure chronic ulcer of other part of right foot with fat layer exposed: Secondary | ICD-10-CM

## 2023-04-07 ENCOUNTER — Encounter (HOSPITAL_BASED_OUTPATIENT_CLINIC_OR_DEPARTMENT_OTHER): Payer: Medicaid Other | Admitting: Internal Medicine

## 2023-04-07 DIAGNOSIS — E11621 Type 2 diabetes mellitus with foot ulcer: Secondary | ICD-10-CM | POA: Diagnosis not present

## 2023-04-07 DIAGNOSIS — L97512 Non-pressure chronic ulcer of other part of right foot with fat layer exposed: Secondary | ICD-10-CM | POA: Diagnosis not present

## 2023-04-14 ENCOUNTER — Encounter (HOSPITAL_BASED_OUTPATIENT_CLINIC_OR_DEPARTMENT_OTHER): Payer: Medicaid Other | Admitting: Internal Medicine

## 2023-04-14 DIAGNOSIS — M86671 Other chronic osteomyelitis, right ankle and foot: Secondary | ICD-10-CM

## 2023-04-14 DIAGNOSIS — E11621 Type 2 diabetes mellitus with foot ulcer: Secondary | ICD-10-CM

## 2023-04-14 DIAGNOSIS — L97512 Non-pressure chronic ulcer of other part of right foot with fat layer exposed: Secondary | ICD-10-CM

## 2023-04-21 ENCOUNTER — Ambulatory Visit (HOSPITAL_BASED_OUTPATIENT_CLINIC_OR_DEPARTMENT_OTHER): Admitting: Internal Medicine

## 2023-04-22 ENCOUNTER — Encounter (HOSPITAL_BASED_OUTPATIENT_CLINIC_OR_DEPARTMENT_OTHER): Attending: Internal Medicine | Admitting: Internal Medicine

## 2023-04-22 DIAGNOSIS — M86671 Other chronic osteomyelitis, right ankle and foot: Secondary | ICD-10-CM | POA: Diagnosis not present

## 2023-04-22 DIAGNOSIS — L97512 Non-pressure chronic ulcer of other part of right foot with fat layer exposed: Secondary | ICD-10-CM | POA: Diagnosis not present

## 2023-04-22 DIAGNOSIS — Z86718 Personal history of other venous thrombosis and embolism: Secondary | ICD-10-CM | POA: Insufficient documentation

## 2023-04-22 DIAGNOSIS — M79661 Pain in right lower leg: Secondary | ICD-10-CM | POA: Diagnosis not present

## 2023-04-22 DIAGNOSIS — E11621 Type 2 diabetes mellitus with foot ulcer: Secondary | ICD-10-CM | POA: Diagnosis present

## 2023-04-24 ENCOUNTER — Other Ambulatory Visit (HOSPITAL_COMMUNITY): Payer: Self-pay | Admitting: Internal Medicine

## 2023-04-24 ENCOUNTER — Ambulatory Visit (HOSPITAL_COMMUNITY)
Admission: RE | Admit: 2023-04-24 | Discharge: 2023-04-24 | Disposition: A | Discharge status: Home / Self Care | Source: Ambulatory Visit | Attending: Cardiovascular Disease | Admitting: Cardiovascular Disease

## 2023-04-24 DIAGNOSIS — M7989 Other specified soft tissue disorders: Secondary | ICD-10-CM | POA: Insufficient documentation

## 2023-04-24 DIAGNOSIS — M79604 Pain in right leg: Secondary | ICD-10-CM | POA: Diagnosis present

## 2023-04-28 ENCOUNTER — Encounter (HOSPITAL_BASED_OUTPATIENT_CLINIC_OR_DEPARTMENT_OTHER): Admitting: Internal Medicine

## 2023-04-28 DIAGNOSIS — E11621 Type 2 diabetes mellitus with foot ulcer: Secondary | ICD-10-CM | POA: Diagnosis not present

## 2023-05-05 ENCOUNTER — Ambulatory Visit (HOSPITAL_BASED_OUTPATIENT_CLINIC_OR_DEPARTMENT_OTHER): Admitting: Internal Medicine

## 2023-05-06 ENCOUNTER — Encounter (HOSPITAL_BASED_OUTPATIENT_CLINIC_OR_DEPARTMENT_OTHER): Admitting: Internal Medicine

## 2023-05-06 DIAGNOSIS — E11621 Type 2 diabetes mellitus with foot ulcer: Secondary | ICD-10-CM | POA: Diagnosis not present

## 2023-05-06 DIAGNOSIS — L97512 Non-pressure chronic ulcer of other part of right foot with fat layer exposed: Secondary | ICD-10-CM

## 2023-05-12 ENCOUNTER — Ambulatory Visit (HOSPITAL_BASED_OUTPATIENT_CLINIC_OR_DEPARTMENT_OTHER): Admitting: Internal Medicine

## 2023-05-13 ENCOUNTER — Encounter (HOSPITAL_BASED_OUTPATIENT_CLINIC_OR_DEPARTMENT_OTHER): Admitting: Internal Medicine

## 2023-05-13 DIAGNOSIS — L97512 Non-pressure chronic ulcer of other part of right foot with fat layer exposed: Secondary | ICD-10-CM

## 2023-05-13 DIAGNOSIS — E11621 Type 2 diabetes mellitus with foot ulcer: Secondary | ICD-10-CM

## 2023-05-19 ENCOUNTER — Other Ambulatory Visit: Payer: Self-pay

## 2023-05-19 ENCOUNTER — Telehealth: Payer: Self-pay

## 2023-05-19 ENCOUNTER — Encounter (HOSPITAL_BASED_OUTPATIENT_CLINIC_OR_DEPARTMENT_OTHER): Attending: Internal Medicine | Admitting: Internal Medicine

## 2023-05-19 DIAGNOSIS — M25374 Other instability, right foot: Secondary | ICD-10-CM | POA: Diagnosis not present

## 2023-05-19 DIAGNOSIS — Z86718 Personal history of other venous thrombosis and embolism: Secondary | ICD-10-CM | POA: Insufficient documentation

## 2023-05-19 DIAGNOSIS — L97512 Non-pressure chronic ulcer of other part of right foot with fat layer exposed: Secondary | ICD-10-CM | POA: Diagnosis not present

## 2023-05-19 DIAGNOSIS — M86671 Other chronic osteomyelitis, right ankle and foot: Secondary | ICD-10-CM | POA: Insufficient documentation

## 2023-05-19 DIAGNOSIS — E11621 Type 2 diabetes mellitus with foot ulcer: Secondary | ICD-10-CM | POA: Insufficient documentation

## 2023-05-19 NOTE — Telephone Encounter (Signed)
 Pharmacy Patient Advocate Encounter  Received notification from [facility] that Prior Authorization for TRULICITY  has been APPROVED from 05/19/2023 to 05/18/2024   PA #/Case ID/Reference #: [id number]

## 2023-05-19 NOTE — Telephone Encounter (Signed)
 Pharmacy Patient Advocate Encounter   Received notification from CoverMyMeds that prior authorization for TRULICITY  is required/requested.   Insurance verification completed.   The patient is insured through [facility] .   Per test claim: PA required; PA submitted to above mentioned insurance via CoverMyMeds Key/confirmation #/EOC [id number] Status is pending

## 2023-05-20 ENCOUNTER — Other Ambulatory Visit: Payer: Self-pay

## 2023-05-26 ENCOUNTER — Ambulatory Visit (HOSPITAL_BASED_OUTPATIENT_CLINIC_OR_DEPARTMENT_OTHER): Admitting: Internal Medicine

## 2023-05-28 ENCOUNTER — Other Ambulatory Visit: Payer: Self-pay

## 2023-06-02 ENCOUNTER — Encounter (HOSPITAL_BASED_OUTPATIENT_CLINIC_OR_DEPARTMENT_OTHER): Admitting: Internal Medicine

## 2023-06-02 DIAGNOSIS — E11621 Type 2 diabetes mellitus with foot ulcer: Secondary | ICD-10-CM | POA: Diagnosis not present

## 2023-06-02 DIAGNOSIS — L97512 Non-pressure chronic ulcer of other part of right foot with fat layer exposed: Secondary | ICD-10-CM | POA: Diagnosis not present

## 2023-06-10 ENCOUNTER — Ambulatory Visit (HOSPITAL_BASED_OUTPATIENT_CLINIC_OR_DEPARTMENT_OTHER): Admitting: Internal Medicine

## 2023-06-16 ENCOUNTER — Encounter (HOSPITAL_BASED_OUTPATIENT_CLINIC_OR_DEPARTMENT_OTHER): Attending: Internal Medicine | Admitting: Internal Medicine

## 2023-06-16 DIAGNOSIS — L97512 Non-pressure chronic ulcer of other part of right foot with fat layer exposed: Secondary | ICD-10-CM | POA: Insufficient documentation

## 2023-06-16 DIAGNOSIS — M25374 Other instability, right foot: Secondary | ICD-10-CM | POA: Insufficient documentation

## 2023-06-16 DIAGNOSIS — Z86718 Personal history of other venous thrombosis and embolism: Secondary | ICD-10-CM | POA: Diagnosis not present

## 2023-06-16 DIAGNOSIS — M86671 Other chronic osteomyelitis, right ankle and foot: Secondary | ICD-10-CM | POA: Insufficient documentation

## 2023-06-16 DIAGNOSIS — E11621 Type 2 diabetes mellitus with foot ulcer: Secondary | ICD-10-CM | POA: Insufficient documentation

## 2023-06-18 ENCOUNTER — Ambulatory Visit (INDEPENDENT_AMBULATORY_CARE_PROVIDER_SITE_OTHER)

## 2023-06-23 ENCOUNTER — Ambulatory Visit (HOSPITAL_BASED_OUTPATIENT_CLINIC_OR_DEPARTMENT_OTHER): Admitting: Internal Medicine

## 2023-06-24 ENCOUNTER — Encounter (HOSPITAL_BASED_OUTPATIENT_CLINIC_OR_DEPARTMENT_OTHER): Admitting: Internal Medicine

## 2023-06-24 ENCOUNTER — Other Ambulatory Visit: Payer: Self-pay

## 2023-06-24 DIAGNOSIS — L97512 Non-pressure chronic ulcer of other part of right foot with fat layer exposed: Secondary | ICD-10-CM | POA: Diagnosis not present

## 2023-06-24 DIAGNOSIS — E11621 Type 2 diabetes mellitus with foot ulcer: Secondary | ICD-10-CM | POA: Diagnosis not present

## 2023-06-24 MED ORDER — TRIAMCINOLONE ACETONIDE 0.1 % EX CREA
1.0000 | TOPICAL_CREAM | Freq: Every day | CUTANEOUS | 0 refills | Status: AC
  Filled 2023-06-24: qty 30, 30d supply, fill #0

## 2023-06-24 MED ORDER — GENTAMICIN SULFATE 0.1 % EX CREA
1.0000 | TOPICAL_CREAM | Freq: Every day | CUTANEOUS | 1 refills | Status: AC
  Filled 2023-06-24: qty 30, 30d supply, fill #0

## 2023-06-25 ENCOUNTER — Other Ambulatory Visit: Payer: Self-pay

## 2023-06-30 ENCOUNTER — Encounter (HOSPITAL_BASED_OUTPATIENT_CLINIC_OR_DEPARTMENT_OTHER): Admitting: Internal Medicine

## 2023-06-30 DIAGNOSIS — L97512 Non-pressure chronic ulcer of other part of right foot with fat layer exposed: Secondary | ICD-10-CM | POA: Diagnosis not present

## 2023-06-30 DIAGNOSIS — E11621 Type 2 diabetes mellitus with foot ulcer: Secondary | ICD-10-CM | POA: Diagnosis not present

## 2023-07-02 ENCOUNTER — Telehealth (INDEPENDENT_AMBULATORY_CARE_PROVIDER_SITE_OTHER): Payer: Self-pay | Admitting: Primary Care

## 2023-07-02 NOTE — Telephone Encounter (Signed)
 Spoke to pt about upcoming appt.. Will be present

## 2023-07-03 ENCOUNTER — Encounter (INDEPENDENT_AMBULATORY_CARE_PROVIDER_SITE_OTHER): Payer: Self-pay | Admitting: Primary Care

## 2023-07-03 ENCOUNTER — Telehealth (INDEPENDENT_AMBULATORY_CARE_PROVIDER_SITE_OTHER): Payer: Self-pay | Admitting: Primary Care

## 2023-07-03 ENCOUNTER — Other Ambulatory Visit: Payer: Self-pay

## 2023-07-03 ENCOUNTER — Ambulatory Visit (INDEPENDENT_AMBULATORY_CARE_PROVIDER_SITE_OTHER): Admitting: Primary Care

## 2023-07-03 VITALS — BP 115/81 | HR 82 | Resp 16 | Wt 335.0 lb

## 2023-07-03 DIAGNOSIS — E1165 Type 2 diabetes mellitus with hyperglycemia: Secondary | ICD-10-CM

## 2023-07-03 DIAGNOSIS — Z7985 Long-term (current) use of injectable non-insulin antidiabetic drugs: Secondary | ICD-10-CM | POA: Diagnosis not present

## 2023-07-03 DIAGNOSIS — E08621 Diabetes mellitus due to underlying condition with foot ulcer: Secondary | ICD-10-CM | POA: Diagnosis not present

## 2023-07-03 DIAGNOSIS — E11621 Type 2 diabetes mellitus with foot ulcer: Secondary | ICD-10-CM

## 2023-07-03 DIAGNOSIS — Z5941 Food insecurity: Secondary | ICD-10-CM

## 2023-07-03 DIAGNOSIS — Z7984 Long term (current) use of oral hypoglycemic drugs: Secondary | ICD-10-CM

## 2023-07-03 DIAGNOSIS — Z794 Long term (current) use of insulin: Secondary | ICD-10-CM | POA: Diagnosis not present

## 2023-07-03 DIAGNOSIS — L97519 Non-pressure chronic ulcer of other part of right foot with unspecified severity: Secondary | ICD-10-CM

## 2023-07-03 DIAGNOSIS — F419 Anxiety disorder, unspecified: Secondary | ICD-10-CM

## 2023-07-03 LAB — POCT GLYCOSYLATED HEMOGLOBIN (HGB A1C): HbA1c, POC (controlled diabetic range): 10.7 % — AB (ref 0.0–7.0)

## 2023-07-03 NOTE — Telephone Encounter (Signed)
 Pt called and stated that she was running behind. I told her bc she called to come in.

## 2023-07-03 NOTE — Progress Notes (Signed)
 [facility]  [name], is a 43 y.o. female  ZOX:[id number]  [id number]:[id number]  DOB - May 27, 1980  Chief Complaint  Patient presents with   Hyperlipidemia   Diabetes       Subjective:   [name] is a 43 y.o. female here today for a follow up visit. T2D- A1C remains the same 10.7 for 6 months Denies polyuria, polydipsia, polyphasia or vision changes.  Does not check blood sugars at home.  Complications from uncontrolled diabetes -diabetic retinopathy leading to blindness, diabetic nephropathy leading to dialysis, decrease in circulation decrease in sores or wound healing which may lead to amputations and increase of heart attack and stroke . Effects on the liver- non-alcoholic fatty liver disease (NAFLD), which can progress to non-alcoholic steatohepatitis (NASH), cirrhosis, liver failure, and even liver cancer , lead to fat accumulation in the liver, causing NAFLD, and if left untreated, can lead to more severe liver damage. She has already had a amputation on right foot  HPI  No problems updated.  Comprehensive ROS Pertinent positive and negative noted in HPI   No Known Allergies  Past Medical History:  Diagnosis Date   Class 3 obesity 12/16/2020   Diabetes mellitus     Current Outpatient Medications on File Prior to Visit  Medication Sig Dispense Refill   apixaban  (ELIQUIS ) 5 MG TABS tablet Take 1 tablet (5mg ) by mouth twice daily. 180 tablet 1   Blood Glucose Monitoring Suppl (TRUE METRIX METER) w/Device KIT Use to check blood sugar twice a day. 1 kit 0   doxycycline  (VIBRA -TABS) 100 MG tablet Take 1 tablet (100 mg total) by mouth 2 (two) times daily for 14 day.s (Patient not taking: Reported on 03/18/2023) 28 tablet 0   Dulaglutide  (TRULICITY ) 1.5 MG/0.5ML SOAJ Inject 1.5 mg into the skin once a week. 6 mL 1   gabapentin  (NEURONTIN ) 300 MG capsule Take 2 capsules by mouth 3 times daily. 180 capsule 2   gentamicin  cream (GARAMYCIN ) 0.1 % Apply 1 Application  topically daily to the bed wound 30 g 1   gentamicin  ointment (GARAMYCIN ) 0.1 % Apply 1 Application topically daily. 30 g 0   glucose blood (TRUE METRIX BLOOD GLUCOSE TEST) test strip Use to check blood sugar twice a day. 100 each 2   insulin  glargine (LANTUS  SOLOSTAR) 100 UNIT/ML Solostar Pen Inject 70 Units into the skin daily. 15 mL 3   Insulin  Pen Needle (TECHLITE PLUS PEN NEEDLES) 32G X 4 MM MISC use as directed 100 each 2   lisinopril  (ZESTRIL ) 2.5 MG tablet Take 1 tablet (2.5 mg total) by mouth daily. 90 tablet 1   metFORMIN  (GLUCOPHAGE -XR) 500 MG 24 hr tablet Take 2 tablets (1,000 mg total) by mouth 2 (two) times daily. 120 tablet 2   oxyCODONE  (OXY IR/ROXICODONE ) 5 MG immediate release tablet Take 5 mg by mouth every 6 (six) hours as needed for moderate pain or severe pain.     sodium hypochlorite (DAKIN'S 1/2 STRENGTH) external solution Use as directed for wet to dry dressings daily to wound. 473 mL 2   triamcinolone  cream (KENALOG ) 0.1 % Apply 1 Application topically daily as needed on areas of skin irritation 30 g 0   TRUEplus Lancets 28G MISC Use to check blood sugar twice a day. 100 each 2   No current facility-administered medications on file prior to visit.   Health Maintenance  Topic Date Due   Complete foot exam   Never done   Eye exam for diabetics  Never  done   DTaP/Tdap/Td vaccine (1 - Tdap) Never done   HPV Vaccine (1 - Risk 3-dose SCDM series) Never done   COVID-19 Vaccine (1 - 2024-25 season) Never done   Yearly kidney function blood test for diabetes  01/31/2023   Pap with HPV screening  03/17/2024*   Pneumococcal Vaccination (1 of 2 - PCV) 03/17/2024*   Flu Shot  08/15/2023   Hemoglobin A1C  01/02/2024   Yearly kidney health urinalysis for diabetes  03/17/2024   Hepatitis C Screening  Completed   HIV Screening  Completed   Meningitis B Vaccine  Aged Out  *Topic was postponed. The date shown is not the original due date.    Objective:   Vitals:   07/03/23  1141  BP: 115/81  Pulse: 82  Resp: 16  SpO2: 98%  Weight: (!) 335 lb (152 kg)   BP Readings from Last 3 Encounters:  07/03/23 115/81  12/11/22 110/74  02/13/22 105/61      Physical Exam Vitals reviewed.  Constitutional:      Appearance: Normal appearance. She is obese.  HENT:     Head: Normocephalic.     Right Ear: Tympanic membrane, ear canal and external ear normal.     Left Ear: Tympanic membrane, ear canal and external ear normal.     Nose: Nose normal.     Mouth/Throat:     Mouth: Mucous membranes are moist.   Eyes:     Extraocular Movements: Extraocular movements intact.     Pupils: Pupils are equal, round, and reactive to light.    Cardiovascular:     Rate and Rhythm: Normal rate.  Pulmonary:     Effort: Pulmonary effort is normal.     Breath sounds: Normal breath sounds.  Abdominal:     General: Bowel sounds are normal.     Palpations: Abdomen is soft.   Musculoskeletal:     Cervical back: Normal range of motion.     Comments: Decrease ROM uses wheel chair and walker for mobility    Skin:    General: Skin is warm.   Neurological:     Mental Status: She is alert and oriented to person, place, and time.   Psychiatric:        Mood and Affect: Mood normal.        Behavior: Behavior normal.        Thought Content: Thought content normal.       Assessment & Plan  [name] was seen today for hyperlipidemia and diabetes.  Diagnoses and all orders for this visit:  Type 2 diabetes mellitus with hyperglycemia, unspecified whether long term insulin  use (HCC) -     POCT glycosylated hemoglobin (Hb A1C) -     Ambulatory referral to Ophthalmology -     Microalbumin / creatinine urine ratio Clinical pharmacist 1 st available  Food insecurity -     AMB Referral VBCI Care Management  Diabetic ulcer of right foot associated with diabetes mellitus due to underlying condition, unspecified part of foot, unspecified ulcer stage (HC Followed by Theron Flavin, DO  wound care   Patient have been counseled extensively about nutrition and exercise. Other issues discussed during this visit include: low cholesterol diet, weight control and daily exercise, foot care, annual eye examinations at Ophthalmology, importance of adherence with medications and regular follow-up. We also discussed long term complications of uncontrolled diabetes and hypertension.   Return in about 3 months (around 10/03/2023) for fasting labs.  The  patient was given clear instructions to go to ER or return to medical center if symptoms don't improve, worsen or new problems develop. The patient verbalized understanding. The patient was told to call to get lab results if they haven't heard anything in the next week.   This note has been created with Education officer, environmental. Any transcriptional errors are unintentional.   Marius Siemens, NP 07/03/2023, 12:06 PM

## 2023-07-04 ENCOUNTER — Ambulatory Visit (INDEPENDENT_AMBULATORY_CARE_PROVIDER_SITE_OTHER): Payer: Self-pay | Admitting: Primary Care

## 2023-07-04 ENCOUNTER — Telehealth: Payer: Self-pay | Admitting: *Deleted

## 2023-07-04 ENCOUNTER — Other Ambulatory Visit: Payer: Self-pay

## 2023-07-04 LAB — MICROALBUMIN / CREATININE URINE RATIO
Creatinine, Urine: 205.6 mg/dL
Microalb/Creat Ratio: 4 mg/g{creat} (ref 0–29)
Microalbumin, Urine: 8.8 ug/mL

## 2023-07-04 NOTE — Progress Notes (Signed)
 Complex Care Management Note Care Guide Note  07/04/2023 Name: [name] MRN: [id number] DOB: Dec 26, 1980  [name] is a 43 y.o. year old female who is a primary care patient of Marius Siemens, NP . The community resource team was consulted for assistance with Food Insecurity  SDOH screenings and interventions completed:  Yes   Provided app for food banks      Care guide performed the following interventions: Patient provided with information about care guide support team and interviewed to confirm resource needs.  Follow Up Plan:  No further follow up planned at this time. The patient has been provided with needed resources.  Encounter Outcome:  Patient Visit Completed  Teryn Boerema Greenauer-Moran  [facility] HealthPopulation Health Care Guide  Direct [phone] Fax:[phone] Website: [email or web address]

## 2023-07-07 ENCOUNTER — Encounter (HOSPITAL_BASED_OUTPATIENT_CLINIC_OR_DEPARTMENT_OTHER): Admitting: Internal Medicine

## 2023-07-07 NOTE — Addendum Note (Signed)
 Addended by: CASIMIR JUVENAL SAUNDERS on: 07/07/2023 04:50 PM   Modules accepted: Orders

## 2023-07-08 ENCOUNTER — Encounter (HOSPITAL_BASED_OUTPATIENT_CLINIC_OR_DEPARTMENT_OTHER): Admitting: Internal Medicine

## 2023-07-08 DIAGNOSIS — E11621 Type 2 diabetes mellitus with foot ulcer: Secondary | ICD-10-CM

## 2023-07-08 DIAGNOSIS — L97512 Non-pressure chronic ulcer of other part of right foot with fat layer exposed: Secondary | ICD-10-CM

## 2023-07-10 ENCOUNTER — Telehealth: Payer: Self-pay | Admitting: *Deleted

## 2023-07-10 NOTE — Progress Notes (Signed)
 Complex Care Management Note  Care Guide Note 07/10/2023 Name: [name] MRN: [id number] DOB: [birth year]  [name] is a 43 y.o. year old female who sees Celestia Rosaline SQUIBB, NP for primary care. I reached out to [name] by phone today to offer complex care management services.  Ms. Eddleman was given information about Complex Care Management services today including:   The Complex Care Management services include support from the care team which includes your Nurse Care Manager, Clinical Social Worker, or Pharmacist.  The Complex Care Management team is here to help remove barriers to the health concerns and goals most important to you. Complex Care Management services are voluntary, and the patient may decline or stop services at any time by request to their care team member.   Complex Care Management Consent Status: Patient agreed to services and verbal consent obtained.   Follow up plan:  Telephone appointment with complex care management team member scheduled for:  7/10  Encounter Outcome:  Patient Scheduled  Harlene Satterfield  [facility]  [facility], [facility] Guide  Direct Dial: [phone]  Fax [phone]

## 2023-07-14 ENCOUNTER — Encounter (HOSPITAL_BASED_OUTPATIENT_CLINIC_OR_DEPARTMENT_OTHER): Admitting: Internal Medicine

## 2023-07-14 DIAGNOSIS — L97512 Non-pressure chronic ulcer of other part of right foot with fat layer exposed: Secondary | ICD-10-CM | POA: Diagnosis not present

## 2023-07-14 DIAGNOSIS — M86671 Other chronic osteomyelitis, right ankle and foot: Secondary | ICD-10-CM

## 2023-07-14 DIAGNOSIS — E11621 Type 2 diabetes mellitus with foot ulcer: Secondary | ICD-10-CM | POA: Diagnosis not present

## 2023-07-21 ENCOUNTER — Encounter (HOSPITAL_BASED_OUTPATIENT_CLINIC_OR_DEPARTMENT_OTHER): Attending: Internal Medicine | Admitting: Internal Medicine

## 2023-07-21 DIAGNOSIS — M86671 Other chronic osteomyelitis, right ankle and foot: Secondary | ICD-10-CM | POA: Insufficient documentation

## 2023-07-21 DIAGNOSIS — E11621 Type 2 diabetes mellitus with foot ulcer: Secondary | ICD-10-CM | POA: Insufficient documentation

## 2023-07-21 DIAGNOSIS — E1169 Type 2 diabetes mellitus with other specified complication: Secondary | ICD-10-CM | POA: Insufficient documentation

## 2023-07-21 DIAGNOSIS — L97512 Non-pressure chronic ulcer of other part of right foot with fat layer exposed: Secondary | ICD-10-CM | POA: Diagnosis not present

## 2023-07-21 DIAGNOSIS — M25374 Other instability, right foot: Secondary | ICD-10-CM | POA: Insufficient documentation

## 2023-07-21 DIAGNOSIS — Z86718 Personal history of other venous thrombosis and embolism: Secondary | ICD-10-CM | POA: Insufficient documentation

## 2023-07-24 ENCOUNTER — Telehealth: Payer: Self-pay | Admitting: Licensed Clinical Social Worker

## 2023-07-28 ENCOUNTER — Encounter (HOSPITAL_BASED_OUTPATIENT_CLINIC_OR_DEPARTMENT_OTHER): Admitting: Internal Medicine

## 2023-07-28 DIAGNOSIS — M86671 Other chronic osteomyelitis, right ankle and foot: Secondary | ICD-10-CM | POA: Diagnosis not present

## 2023-07-28 DIAGNOSIS — E11621 Type 2 diabetes mellitus with foot ulcer: Secondary | ICD-10-CM | POA: Diagnosis not present

## 2023-07-28 DIAGNOSIS — L97512 Non-pressure chronic ulcer of other part of right foot with fat layer exposed: Secondary | ICD-10-CM

## 2023-07-31 ENCOUNTER — Telehealth: Payer: Self-pay | Admitting: Primary Care

## 2023-07-31 NOTE — Telephone Encounter (Signed)
 Called patient to confirm upcoming appointment. Patient stated she will back to confirm. Please confirm or reschedule the appointment per patient request. Thank you.

## 2023-08-01 NOTE — Telephone Encounter (Signed)
 Called patient, patient requested to reschedule appointment for 08/04/2023. Patient appointment has been scheduled for 08/08/2023 at 2:00 pm, patient acknowledged appointment.

## 2023-08-01 NOTE — Telephone Encounter (Unsigned)
 Copied from CRM [id number]. Topic: Appointments - Scheduling Inquiry for Clinic >> Aug 01, 2023  2:40 PM Willma R wrote: Reason for CRM: Patient would like to reschedule her appointment on 08/04/23 with Garnette Salinas Ausdall.  Patient can be reached at [phone]

## 2023-08-04 ENCOUNTER — Encounter (HOSPITAL_BASED_OUTPATIENT_CLINIC_OR_DEPARTMENT_OTHER): Admitting: Internal Medicine

## 2023-08-04 ENCOUNTER — Ambulatory Visit: Admitting: Pharmacist

## 2023-08-04 ENCOUNTER — Other Ambulatory Visit: Payer: Self-pay

## 2023-08-04 DIAGNOSIS — L97512 Non-pressure chronic ulcer of other part of right foot with fat layer exposed: Secondary | ICD-10-CM | POA: Diagnosis not present

## 2023-08-04 DIAGNOSIS — E11621 Type 2 diabetes mellitus with foot ulcer: Secondary | ICD-10-CM

## 2023-08-04 DIAGNOSIS — M86671 Other chronic osteomyelitis, right ankle and foot: Secondary | ICD-10-CM

## 2023-08-04 MED ORDER — AMOXICILLIN-POT CLAVULANATE 875-125 MG PO TABS
1.0000 | ORAL_TABLET | Freq: Two times a day (BID) | ORAL | 0 refills | Status: AC
  Filled 2023-08-04: qty 28, 14d supply, fill #0

## 2023-08-05 ENCOUNTER — Other Ambulatory Visit: Payer: Self-pay | Admitting: Licensed Clinical Social Worker

## 2023-08-06 ENCOUNTER — Other Ambulatory Visit: Payer: Self-pay

## 2023-08-06 NOTE — Patient Instructions (Addendum)
 Visit Information  [name] was given information about Medicaid Managed Care team care coordination services as a part of their [employer] Medicaid benefit. [name] verbally consentedto engagement with the [facility] Managed Care team.   If you are experiencing a medical emergency, please call 911 or report to your local emergency department or urgent care.   If you have a non-emergency medical problem during routine business hours, please contact your provider's office and ask to speak with a nurse.   For questions related to your [employer] [facility] health plan, please call: [phone]  OR visit the member homepage at: [email or web address]  If you would like to schedule transportation through your [employer] [facility] plan, please call the following number at least 2 days in advance of your appointment: [phone]  If you are experiencing a behavioral health crisis, call the [employer] [place]  Behavioral Health Crisis Line at [phone]). The line is available 24 hours a day, seven days a week.  If you would like help to quit smoking, call 1-800-QUIT-NOW ((405)765-4107) OR Espaol: 1-855-Djelo-Ya (8-144-664-6430) o para ms informacin haga clic aqu or Text READY to 799-599 to register via text   [name] - following are the goals we discussed in your visit today:    Goals Addressed             This Visit's Progress    LCSW VBCI Social Work Care Plan   On track    Problems:   Disease Management support and education needs related to Depression: depressed mood, History of Suicidal Ideation, and Stress at Management of Health Conditions  CSW Clinical Goal(s):   Over the next 90 days the Patient will attend all scheduled medical appointments as evidenced by patient report and care team review of appointment completion in electronic MEDICAL RECORD NUMBER  demonstrate a  reduction in symptoms related to Depression: depressed mood History of Suicidal Ideation .  Interventions:  Mental Health:  Evaluation of current treatment plan related to Depression: depressed mood, History of Suicidal Ideation, and Stress at Management of Health Condition Active listening / Reflection utilized Financial risk analyst / information provided Emotional Support Provided Mindfulness or Relaxation training provided PHQ2/PHQ9 completed Provided general psycho-education for mental health needs Suicidal Ideation/Homicidal Ideation assessed: Patient denies  Patient Goals/Self-Care Activities:  Continue taking your medication as prescribed.   Increase coping skills, healthy habits, and stress reduction  Plan:   Telephone follow up appointment with care management team member scheduled for:  2-4 weeks        Please see education materials related to topics discussed provided as print materials.   The patient verbalized understanding of instructions, educational materials, and care plan provided today and agreed to receive a mailed copy of patient instructions, educational materials, and care plan.   Licensed Clinical Social Worker will call you on 8/8 at 3:30 PM  Rolin Kerns, LCSW [place]  [facility], [facility] Clinical Social Worker Direct Dial: [phone]  Fax: [phone] Website: [email or web address] 8:09 AM   Following is a copy of your plan of care:  There are no care plans that you recently modified to display for this patient.

## 2023-08-06 NOTE — Patient Outreach (Signed)
 Complex Care Management   Visit Note  08/05/2023  Name:  [name] MRN: [id number] DOB: November 01, 1980  Situation: Referral received for Complex Care Management related to Mental/Behavioral Health diagnosis Depression Symptoms I obtained verbal consent from Patient.  Visit completed with pt  on the phone  Background:   Past Medical History:  Diagnosis Date   Class 3 obesity 12/16/2020   Diabetes mellitus     Assessment: Patient Reported Symptoms:  Cognitive Cognitive Status: Alert and oriented to person, place, and time, Normal speech and language skills Cognitive/Intellectual Conditions Management [RPT]: None reported or documented in medical history or problem list   Health Maintenance Behaviors: Annual physical exam, Stress management  Neurological Neurological Review of Symptoms: No symptoms reported    HEENT HEENT Symptoms Reported: No symptoms reported      Cardiovascular Cardiovascular Symptoms Reported: No symptoms reported    Respiratory Respiratory Symptoms Reported: No symptoms reported    Endocrine Endocrine Symptoms Reported: No symptoms reported Is patient diabetic?: Yes    Gastrointestinal Gastrointestinal Symptoms Reported: Change in appetite      Genitourinary Genitourinary Symptoms Reported: No symptoms reported    Integumentary Integumentary Symptoms Reported: Wound Additional Integumentary Details: Patient goes to wound care Skin Management Strategies: Coping strategies, Dressing changes, Routine screening  Musculoskeletal Musculoskelatal Symptoms Reviewed: Limited mobility Additional Musculoskeletal Details: Decrease ROM uses wheel chair and walker for mobility Musculoskeletal Management Strategies: Coping strategies      Psychosocial Psychosocial Symptoms Reported: Depression - if selected complete PHQ 2-9 Behavioral Management Strategies: Coping strategies Major Change/Loss/Stressor/Fears (CP): Medical condition, self Techniques to Cope with  Loss/Stress/Change: Diversional activities Quality of Family Relationships: involved Do you feel physically threatened by others?: No      08/05/2023    2:47 PM  Depression screen PHQ 2/9  Decreased Interest 3  Down, Depressed, Hopeless 1  PHQ - 2 Score 4  Altered sleeping 3  Tired, decreased energy 3  Change in appetite 2  Feeling bad or failure about yourself  3  Trouble concentrating 3  Moving slowly or fidgety/restless 1  Suicidal thoughts 1  PHQ-9 Score 20    There were no vitals filed for this visit.  Medications Reviewed Today     Reviewed by Ezzard Rolin BIRCH, LCSW (Social Worker) on 08/05/23 at 1446  Med List Status: <None>   Medication Order Taking? Sig Documenting Provider Last Dose Status Informant  amoxicillin -clavulanate (AUGMENTIN ) 875-125 MG tablet [id number] Yes Take 1 tablet by mouth 2 (two) times daily for 14 days.   Active   apixaban  (ELIQUIS ) 5 MG TABS tablet [id number] Yes Take 1 tablet (5mg ) by mouth twice daily. Celestia Rosaline SQUIBB, NP  Active Self  Blood Glucose Monitoring Suppl (TRUE METRIX METER) w/Device KIT [id number] Yes Use to check blood sugar twice a day. Newlin, Enobong, MD  Active Self  doxycycline  (VIBRA -TABS) 100 MG tablet [id number] Yes Take 1 tablet (100 mg total) by mouth 2 (two) times daily for 14 day.s Hoffman, Harlene Fickle, DO  Active   Dulaglutide  (TRULICITY ) 1.5 MG/0.5ML EMMANUEL [id number] Yes Inject 1.5 mg into the skin once a week. Celestia Rosaline SQUIBB, NP  Active   gabapentin  (NEURONTIN ) 300 MG capsule [id number] Yes Take 2 capsules by mouth 3 times daily. Celestia Rosaline SQUIBB, NP  Active   gentamicin  cream (GARAMYCIN ) 0.1 % [id number] Yes Apply 1 Application topically daily to the bed wound   Active   gentamicin  ointment (GARAMYCIN ) 0.1 % [id number] Yes Apply 1 Application topically daily.   Active  Self  glucose blood (TRUE METRIX BLOOD GLUCOSE TEST) test strip [id number] Yes Use to check blood sugar twice a day. Newlin, Enobong, MD  Active Self   insulin  glargine (LANTUS  SOLOSTAR) 100 UNIT/ML Solostar Pen [id number] Yes Inject 70 Units into the skin daily. Celestia Rosaline SQUIBB, NP  Active   Insulin  Pen Needle (TECHLITE PLUS PEN NEEDLES) 32G X 4 MM MISC [id number] Yes use as directed Celestia Rosaline SQUIBB, NP  Active   lisinopril  (ZESTRIL ) 2.5 MG tablet [id number] Yes Take 1 tablet (2.5 mg total) by mouth daily. Celestia Rosaline SQUIBB, NP  Active   metFORMIN  (GLUCOPHAGE -XR) 500 MG 24 hr tablet [id number] Yes Take 2 tablets (1,000 mg total) by mouth 2 (two) times daily. Celestia Rosaline SQUIBB, NP  Active   oxyCODONE  (OXY IR/ROXICODONE ) 5 MG immediate release tablet [id number] Yes Take 5 mg by mouth every 6 (six) hours as needed for moderate pain or severe pain. [provider]  Active Self           Med Note ALLEGRA, [facility] I   Fri Oct 26, 2021  1:46 AM)    sodium hypochlorite (DAKIN'S 1/2 STRENGTH) external solution [id number] Yes Use as directed for wet to dry dressings daily to wound. Rosan Raisin Ratliff, DO  Active   triamcinolone  cream (KENALOG ) 0.1 % [id number] Yes Apply 1 Application topically daily as needed on areas of skin irritation   Active   TRUEplus Lancets 28G MISC [id number] Yes Use to check blood sugar twice a day. Newlin, Enobong, MD  Active Self            Recommendation:   Continue Current Plan of Care  Follow Up Plan:   Telephone follow-up 2-4 weeks  Rolin Kerns, LCSW [facility]  [facility], [facility] Clinical Social Worker Direct Dial: [phone]  Fax: [phone] Website: [email or web address] 8:08 AM

## 2023-08-07 ENCOUNTER — Telehealth: Payer: Self-pay | Admitting: Primary Care

## 2023-08-07 NOTE — Telephone Encounter (Signed)
 Called pt to confirm appt for 7/25  unable to reach subscriber LVM

## 2023-08-08 ENCOUNTER — Encounter: Payer: Self-pay | Admitting: Pharmacist

## 2023-08-08 ENCOUNTER — Telehealth: Payer: Self-pay | Admitting: Pharmacist

## 2023-08-08 ENCOUNTER — Ambulatory Visit: Attending: Primary Care | Admitting: Pharmacist

## 2023-08-08 ENCOUNTER — Other Ambulatory Visit: Payer: Self-pay

## 2023-08-08 DIAGNOSIS — E1165 Type 2 diabetes mellitus with hyperglycemia: Secondary | ICD-10-CM | POA: Diagnosis not present

## 2023-08-08 DIAGNOSIS — Z7985 Long-term (current) use of injectable non-insulin antidiabetic drugs: Secondary | ICD-10-CM

## 2023-08-08 DIAGNOSIS — Z7984 Long term (current) use of oral hypoglycemic drugs: Secondary | ICD-10-CM

## 2023-08-08 DIAGNOSIS — Z794 Long term (current) use of insulin: Secondary | ICD-10-CM | POA: Diagnosis not present

## 2023-08-08 MED ORDER — OZEMPIC (0.25 OR 0.5 MG/DOSE) 2 MG/3ML ~~LOC~~ SOPN
0.5000 mg | PEN_INJECTOR | SUBCUTANEOUS | 0 refills | Status: DC
  Filled 2023-08-08: qty 3, 28d supply, fill #0

## 2023-08-08 MED ORDER — SEMAGLUTIDE (1 MG/DOSE) 4 MG/3ML ~~LOC~~ SOPN
1.0000 mg | PEN_INJECTOR | SUBCUTANEOUS | 1 refills | Status: DC
  Filled 2023-08-08 – 2023-10-17 (×2): qty 3, 28d supply, fill #0

## 2023-08-08 MED ORDER — FREESTYLE LIBRE 3 PLUS SENSOR MISC
6 refills | Status: AC
  Filled 2023-08-08: qty 2, 30d supply, fill #0
  Filled 2023-09-17: qty 2, 30d supply, fill #1
  Filled 2023-11-12: qty 2, 30d supply, fill #2
  Filled 2024-02-17: qty 2, 30d supply, fill #3

## 2023-08-08 MED ORDER — TOUJEO MAX SOLOSTAR 300 UNIT/ML ~~LOC~~ SOPN
70.0000 [IU] | PEN_INJECTOR | Freq: Every day | SUBCUTANEOUS | 2 refills | Status: AC
  Filled 2023-08-08 (×2): qty 9, 38d supply, fill #0
  Filled 2023-10-16: qty 9, 38d supply, fill #1
  Filled 2024-02-17: qty 9, 38d supply, fill #2

## 2023-08-08 NOTE — Progress Notes (Signed)
 S:    [name] is a 43 y.o. female who presents for diabetes evaluation, education, and management.   PMH is significant for HTN, T2DM, diabetic foot infection and osteomyelitis, HLD, obesity, hx of DVT, hx of hyponatremia. Patient was referred and last seen by Primary Care Provider, Rosaline Bohr, on 07/03/2023. We last saw her 12/2021.  Since her last visit, she is endorsing poor wound healing to her right foot and is being followed by Dr. Rosan. Tells me that she has a couple of upcoming appointments to address this.   Today, patient arrives in good spirits and presents without any assistance. Regarding her DM, she denies any N/V, abdominal pain, or changes in vision.She is tolerating the Trulicity , Basaglar  and metformin  well.   Family/Social History: former smoker; family hx of DM and HTN  Current diabetes medications include: Trulicity  1.5mg  weekly, Basaglar  Kwikpen 70 units daily, metformin  1000mg  BID Patient reports adherence to taking all medications as prescribed.   Insurance coverage: Medicaid   Patient denies hypoglycemic events.  Patient reported home CBG levels: not checking regularly.   Patient reports polyuria, polydipsia.  Patient endorses neuropathy (nerve pain).  Patient denies visual changes.  Patient reports self foot exams.   Patient reported dietary habits:  - Avoids sweets most of the time, tries to limit starches and carbs but admits to continued dietary indiscretion overall.  - Admits that she needs to do better  - Drinks mostly water  Patient-reported exercise habits: - Tries to walk as much as possible but is still limited overall   O:   Lab Results  Component Value Date   HGBA1C 10.7 (A) 07/03/2023   There were no vitals filed for this visit.  Lipid Panel     Component Value Date/Time   CHOL 210 (H) 10/17/2021 1159   TRIG 159 (H) 10/17/2021 1159   HDL 56 10/17/2021 1159   CHOLHDL 3.8 10/17/2021 1159   CHOLHDL 7.1  12/20/2020 0434   VLDL 21 12/20/2020 0434   LDLCALC 126 (H) 10/17/2021 1159   Clinical Atherosclerotic Cardiovascular Disease (ASCVD): No  The 10-year ASCVD risk score (Arnett DK, et al., 2019) is: 2.4%   Values used to calculate the score:     Age: 68 years     Clincally relevant sex: Female     Is Non-Hispanic African American: Yes     Diabetic: Yes     Tobacco smoker: No     Systolic Blood Pressure: 115 mmHg     Is BP treated: Yes     HDL Cholesterol: 56 mg/dL     Total Cholesterol: 210 mg/dL   Patient is participating in a Managed Medicaid Plan:  Yes   A/P: Diabetes longstanding, currently above goal. Patient is able to verbalize appropriate hypoglycemia management plan. Medication adherence appears optimal. Control is suboptimal due to dietary indiscretion and physical inactivity. -Change Basaglar  to Toujeo  for better absorption and longer day supply. Continue at 70 units daily.  -Stop Trulicity . -Start Ozempic  0.25 mg weekly.  -Continued metformin  1000mg  BID.  -Extensively discussed pathophysiology of diabetes, recommended lifestyle interventions, dietary effects on blood sugar control.  -Counseled on s/sx of and management of hypoglycemia.  -Asked patient to take more BG readings at different times throughout the day and bring readings to next appointment.  -Next A1c anticipated 09/2023.  Written patient instructions provided. Patient verbalized understanding of treatment plan.  Total time in face to face counseling 30 minutes.    Follow-up:  Pharmacist visit  in 1-1.5 months.  [name], PharmD, [name], CPP Clinical Pharmacist [facility] [phone]

## 2023-08-08 NOTE — Telephone Encounter (Signed)
 Can we start a PA for Toujeo  and Ozempic ?

## 2023-08-09 ENCOUNTER — Other Ambulatory Visit: Payer: Self-pay

## 2023-08-11 ENCOUNTER — Other Ambulatory Visit: Payer: Self-pay

## 2023-08-11 ENCOUNTER — Encounter (HOSPITAL_BASED_OUTPATIENT_CLINIC_OR_DEPARTMENT_OTHER): Admitting: Internal Medicine

## 2023-08-11 ENCOUNTER — Ambulatory Visit (HOSPITAL_COMMUNITY)
Admission: RE | Admit: 2023-08-11 | Discharge: 2023-08-11 | Disposition: A | Discharge status: Home / Self Care | Source: Ambulatory Visit | Attending: Internal Medicine | Admitting: Internal Medicine

## 2023-08-11 ENCOUNTER — Other Ambulatory Visit (HOSPITAL_COMMUNITY): Payer: Self-pay | Admitting: Internal Medicine

## 2023-08-11 ENCOUNTER — Telehealth: Payer: Self-pay

## 2023-08-11 DIAGNOSIS — M869 Osteomyelitis, unspecified: Secondary | ICD-10-CM | POA: Insufficient documentation

## 2023-08-11 DIAGNOSIS — E11621 Type 2 diabetes mellitus with foot ulcer: Secondary | ICD-10-CM | POA: Diagnosis present

## 2023-08-11 DIAGNOSIS — E1169 Type 2 diabetes mellitus with other specified complication: Secondary | ICD-10-CM | POA: Insufficient documentation

## 2023-08-11 DIAGNOSIS — L97509 Non-pressure chronic ulcer of other part of unspecified foot with unspecified severity: Secondary | ICD-10-CM | POA: Insufficient documentation

## 2023-08-11 NOTE — Telephone Encounter (Signed)
 Pharmacy Patient Advocate Encounter  Received notification from [facility] that Prior Authorization for OZEMPIC  has been APPROVED from 08/11/2023 to 08/10/2024

## 2023-08-12 ENCOUNTER — Other Ambulatory Visit: Payer: Self-pay

## 2023-08-18 ENCOUNTER — Ambulatory Visit (HOSPITAL_BASED_OUTPATIENT_CLINIC_OR_DEPARTMENT_OTHER): Admitting: Internal Medicine

## 2023-08-22 ENCOUNTER — Other Ambulatory Visit: Payer: Self-pay | Admitting: Licensed Clinical Social Worker

## 2023-08-22 DIAGNOSIS — E1165 Type 2 diabetes mellitus with hyperglycemia: Secondary | ICD-10-CM

## 2023-08-22 DIAGNOSIS — E08621 Diabetes mellitus due to underlying condition with foot ulcer: Secondary | ICD-10-CM

## 2023-08-22 NOTE — Patient Outreach (Signed)
 Complex Care Management   Visit Note  08/22/2023  Name:  [name] MRN: [id number] DOB: Jul 11, 1980  Situation: Referral received for Complex Care Management related to Stress Management I obtained verbal consent from Patient.  Visit completed with pt  on the phone  Background:   Past Medical History:  Diagnosis Date   Class 3 obesity 12/16/2020   Diabetes mellitus     Assessment: Patient Reported Symptoms:  Cognitive Cognitive Status: Alert and oriented to person, place, and time, Normal speech and language skills Cognitive/Intellectual Conditions Management [RPT]: None reported or documented in medical history or problem list   Health Maintenance Behaviors: Annual physical exam, Stress management  Neurological Neurological Review of Symptoms: Not assessed    HEENT HEENT Symptoms Reported: Not assessed      Cardiovascular Cardiovascular Symptoms Reported: Not assessed    Respiratory Respiratory Symptoms Reported: Not assesed    Endocrine Endocrine Symptoms Reported: Not assessed    Gastrointestinal Gastrointestinal Symptoms Reported: Not assessed      Genitourinary Genitourinary Symptoms Reported: Not assessed    Integumentary Integumentary Symptoms Reported: Wound Additional Integumentary Details: Next scheduled appt with Wound Care on 8/11, missed last appt due to transportation barriers Skin Management Strategies: Coping strategies, Routine screening, Dressing changes  Musculoskeletal Musculoskelatal Symptoms Reviewed: Limited mobility        Psychosocial Psychosocial Symptoms Reported: Avoiding people, places, activities Additional Psychological Details: Patient reports she was withdrawn/unmotivated last week. Strategies to assist with management of symptoms discussed Behavioral Management Strategies: Coping strategies, Support system Major Change/Loss/Stressor/Fears (CP): Medical condition, self, Resources Techniques to [place] with Loss/Stress/Change:  Diversional activities        08/05/2023    2:47 PM  Depression screen PHQ 2/9  Decreased Interest 3  Down, Depressed, Hopeless 1  PHQ - 2 Score 4  Altered sleeping 3  Tired, decreased energy 3  Change in appetite 2  Feeling bad or failure about yourself  3  Trouble concentrating 3  Moving slowly or fidgety/restless 1  Suicidal thoughts 1  PHQ-9 Score 20    There were no vitals filed for this visit.  Medications Reviewed Today     Reviewed by Annya Lizana D, LCSW (Social Worker) on 08/22/23 at 1554  Med List Status: <None>   Medication Order Taking? Sig Documenting Provider Last Dose Status Informant  amoxicillin -clavulanate (AUGMENTIN ) 875-125 MG tablet [id number]  Take 1 tablet by mouth 2 (two) times daily for 14 days.   Active   apixaban  (ELIQUIS ) 5 MG TABS tablet [id number]  Take 1 tablet (5mg ) by mouth twice daily. Celestia Rosaline SQUIBB, NP  Active Self  Blood Glucose Monitoring Suppl (TRUE METRIX METER) w/Device KIT [id number]  Use to check blood sugar twice a day. Newlin, Enobong, MD  Active Self  Continuous Glucose Sensor (FREESTYLE LIBRE 3 PLUS SENSOR) MISC [id number]  Use to check blood sugar continuously throughout the day. Change sensor every 15 days. Newlin, Enobong, MD  Active   doxycycline  (VIBRA -TABS) 100 MG tablet [id number]  Take 1 tablet (100 mg total) by mouth 2 (two) times daily for 14 day.s Hoffman, Jessica Ratliff, DO  Active   gabapentin  (NEURONTIN ) 300 MG capsule [id number]  Take 2 capsules by mouth 3 times daily. Celestia Rosaline SQUIBB, NP  Active   gentamicin  cream (GARAMYCIN ) 0.1 % [id number]  Apply 1 Application topically daily to the bed wound   Active   gentamicin  ointment (GARAMYCIN ) 0.1 % [id number]  Apply 1 Application topically daily.   Active Self  glucose blood (  TRUE METRIX BLOOD GLUCOSE TEST) test strip [id number]  Use to check blood sugar twice a day. Newlin, Enobong, MD  Active Self  insulin  glargine, 2 Unit Dial , (TOUJEO  MAX SOLOSTAR) 300 UNIT/ML  Solostar Pen [id number]  Inject 70 Units into the skin daily. Newlin, Enobong, MD  Active   Insulin  Pen Needle (TECHLITE PLUS PEN NEEDLES) 32G X 4 MM MISC [id number]  use as directed Celestia Rosaline SQUIBB, NP  Active   lisinopril  (ZESTRIL ) 2.5 MG tablet [id number]  Take 1 tablet (2.5 mg total) by mouth daily. Celestia Rosaline SQUIBB, NP  Active   metFORMIN  (GLUCOPHAGE -XR) 500 MG 24 hr tablet [id number]  Take 2 tablets (1,000 mg total) by mouth 2 (two) times daily. Celestia Rosaline SQUIBB, NP  Active   oxyCODONE  (OXY IR/ROXICODONE ) 5 MG immediate release tablet [id number]  Take 5 mg by mouth every 6 (six) hours as needed for moderate pain or severe pain. [provider]  Active Self           Med Note ALLEGRA, [facility] I   Fri Oct 26, 2021  1:46 AM)    Semaglutide , 1 MG/DOSE, 4 MG/3ML SOPN [id number]  Inject 1 mg as directed once a week. Newlin, Enobong, MD  Active   Semaglutide ,0.25 or 0.5MG /DOS, (OZEMPIC , 0.25 OR 0.5 MG/DOSE,) 2 MG/3ML SOPN [id number]  Inject 0.5 mg into the skin once a week for 28 days, then increase to 1 mg once weekly thereafter. Newlin, Enobong, MD  Active   sodium hypochlorite (DAKIN'S 1/2 STRENGTH) external solution [id number]  Use as directed for wet to dry dressings daily to wound. Rosan Raisin Ratliff, DO  Active   triamcinolone  cream (KENALOG ) 0.1 % [id number]  Apply 1 Application topically daily as needed on areas of skin irritation   Active   TRUEplus Lancets 28G MISC [id number]  Use to check blood sugar twice a day. Newlin, Enobong, MD  Active Self            Recommendation:   Continue Current Plan of Care  Follow Up Plan:   Telephone follow-up in 1 month  Rolin Kerns, LCSW [facility]  [facility], [facility] Clinical Social Worker Direct Dial : [phone]  Fax: [phone] Website: [email or web address] 4:32 PM

## 2023-08-22 NOTE — Patient Instructions (Signed)
 Visit Information  [name] was given information about Medicaid Managed Care team care coordination services as a part of their [employer] Medicaid benefit.   If you would like to schedule transportation through your [facility] plan, please call the following number at least 2 days in advance of your appointment: [phone]  If you are experiencing a behavioral health crisis, call the [employer] [place]  Behavioral Health Crisis Line at [phone]). The line is available 24 hours a day, seven days a week.   Ms. Helderman - following are the goals we discussed in your visit today:    Goals Addressed             This Visit's Progress    LCSW VBCI Social Work Care Plan   On track    Problems:   Disease Management support and education needs related to Depression: depressed mood, History of Suicidal Ideation, and Stress at Management of Health Conditions  CSW Clinical Goal(s):   Over the next 90 days the Patient will attend all scheduled medical appointments as evidenced by patient report and care team review of appointment completion in electronic MEDICAL RECORD NUMBER  demonstrate a reduction in symptoms related to Depression: depressed mood History of Suicidal Ideation .  Interventions:  Mental Health:  Evaluation of current treatment plan related to Depression: depressed mood, History of Suicidal Ideation, and Stress at Management of Health Condition Active listening / Reflection utilized Financial risk analyst / information provided Emotional Support Provided Mindfulness or Relaxation training provided PHQ2/PHQ9 completed Provided general psycho-education for mental health needs Solution-Focued Strategies employed: LCSW discussed strategies to promote self care, self-worth, and positive mood Pt identified ways to strengthen support system  Suicidal Ideation/Homicidal Ideation assessed: Patient denies  Patient  Goals/Self-Care Activities:  Continue taking your medication as prescribed.   Increase coping skills, healthy habits, and stress reduction  Plan:   Telephone follow up appointment with care management team member scheduled for:  2-4 weeks        Please see education materials related to topics discussed provided by e-mail link.  Patient verbalizes understanding of instructions and care plan provided today and agrees to view in MyChart. Active MyChart status and patient understanding of how to access instructions and care plan via MyChart confirmed with patient.     Licensed Clinical Social Worker will 09/12  Rolin Kerns, LCSW [place]  [facility], [facility] Clinical Social Worker Direct Dial : [phone]  Fax: [phone] Website: [email or web address] 4:32 PM   Following is a copy of your plan of care:  There are no care plans that you recently modified to display for this patient.

## 2023-08-25 ENCOUNTER — Telehealth: Payer: Self-pay

## 2023-08-25 ENCOUNTER — Encounter (HOSPITAL_BASED_OUTPATIENT_CLINIC_OR_DEPARTMENT_OTHER): Attending: Internal Medicine | Admitting: Internal Medicine

## 2023-08-25 DIAGNOSIS — E11621 Type 2 diabetes mellitus with foot ulcer: Secondary | ICD-10-CM | POA: Insufficient documentation

## 2023-08-25 DIAGNOSIS — L97512 Non-pressure chronic ulcer of other part of right foot with fat layer exposed: Secondary | ICD-10-CM | POA: Insufficient documentation

## 2023-08-25 DIAGNOSIS — M25374 Other instability, right foot: Secondary | ICD-10-CM | POA: Insufficient documentation

## 2023-08-25 DIAGNOSIS — Z86718 Personal history of other venous thrombosis and embolism: Secondary | ICD-10-CM | POA: Insufficient documentation

## 2023-08-25 DIAGNOSIS — M86671 Other chronic osteomyelitis, right ankle and foot: Secondary | ICD-10-CM | POA: Insufficient documentation

## 2023-08-25 NOTE — Progress Notes (Signed)
 Complex Care Management Note Care Guide Note  08/25/2023 Name: [name] MRN: [id number] DOB: May 10, 1980   Complex Care Management Outreach Attempts: An unsuccessful telephone outreach was attempted today to offer the patient information about available complex care management services.  Follow Up Plan:  Additional outreach attempts will be made to offer the patient complex care management information and services.   Encounter Outcome:  No Answer  Maura Braaten [facility]  [facility] Resource Care Guide Direct Dial : [phone]  Fax: [phone] Website: [email or web address]

## 2023-08-26 ENCOUNTER — Telehealth: Payer: Self-pay

## 2023-08-26 NOTE — Progress Notes (Signed)
 Complex Care Management Note Care Guide Note  08/26/2023 Name: [name] MRN: [id number] DOB: [birth year]   Complex Care Management Outreach Attempts: A second unsuccessful outreach was attempted today to offer the patient with information about available complex care management services.  Follow Up Plan:  Additional outreach attempts will be made to offer the patient complex care management information and services.   Encounter Outcome:  No Answer  Jarita Raval [facility]  [facility] Guide Direct Dial : [phone]  Fax: [phone] Website: [email or web address]

## 2023-08-27 ENCOUNTER — Telehealth: Payer: Self-pay

## 2023-08-27 NOTE — Progress Notes (Signed)
 Complex Care Management Note Care Guide Note  08/27/2023 Name: [name] MRN: [id number] DOB: 29-Nov-1980  [name] is a 43 y.o. year old female who is a primary care patient of Celestia Rosaline SQUIBB, NP . The community resource team was consulted for assistance with Transportation Needs , Food Insecurity, and counseling.  SDOH screenings and interventions completed:  Yes  Social Drivers of Health From This Encounter   Food Insecurity: Food Insecurity Present (08/27/2023)   Hunger Vital Sign    Worried About Running Out of Food in the Last Year: Never true    Ran Out of Food in the Last Year: Sometimes true  Housing: Low Risk  (08/27/2023)   Housing Stability Vital Sign    Unable to Pay for Housing in the Last Year: No    Number of Times Moved in the Last Year: 0    Homeless in the Last Year: No  Financial Resource Strain: Medium Risk (08/27/2023)   Overall Financial Resource Strain (CARDIA)    Difficulty of Paying Living Expenses: Somewhat hard  Transportation Needs: Unmet Transportation Needs (08/27/2023)   PRAPARE - Transportation    Lack of Transportation (Medical): Yes    Lack of Transportation (Non-Medical): Yes  Utilities: Not At Risk (08/27/2023)   Utilities    Threatened with loss of utilities: No    SDOH Interventions Today    Flowsheet Row Most Recent Value  SDOH Interventions   Food Insecurity Interventions Other (Comment)  [Mailed [place] food pantry list.]  Transportation Interventions Other (Comment)  [Completed online application for [employer] transportation and mailed information for [employer] Access Transportation.]     Care guide performed the following interventions: Patient provided with information about care guide support team and interviewed to confirm resource needs.  Follow Up Plan:  No further follow up planned at this time. The patient has been provided with needed resources.  Encounter Outcome:  Patient Visit Completed  Kooper Chriswell [facility]  [facility] Resource Care Guide Direct Dial : [phone]  Fax: [phone] Website: [email or web address]

## 2023-09-01 ENCOUNTER — Other Ambulatory Visit: Payer: Self-pay

## 2023-09-01 ENCOUNTER — Ambulatory Visit

## 2023-09-01 ENCOUNTER — Encounter (HOSPITAL_BASED_OUTPATIENT_CLINIC_OR_DEPARTMENT_OTHER): Admitting: Internal Medicine

## 2023-09-01 ENCOUNTER — Telehealth: Payer: Self-pay | Admitting: Plastic Surgery

## 2023-09-01 DIAGNOSIS — S91301D Unspecified open wound, right foot, subsequent encounter: Secondary | ICD-10-CM

## 2023-09-01 DIAGNOSIS — M86671 Other chronic osteomyelitis, right ankle and foot: Secondary | ICD-10-CM

## 2023-09-01 DIAGNOSIS — E11621 Type 2 diabetes mellitus with foot ulcer: Secondary | ICD-10-CM

## 2023-09-01 DIAGNOSIS — M25374 Other instability, right foot: Secondary | ICD-10-CM | POA: Diagnosis not present

## 2023-09-01 DIAGNOSIS — Z89421 Acquired absence of other right toe(s): Secondary | ICD-10-CM | POA: Diagnosis not present

## 2023-09-01 DIAGNOSIS — T8131XS Disruption of external operation (surgical) wound, not elsewhere classified, sequela: Secondary | ICD-10-CM

## 2023-09-01 DIAGNOSIS — L97512 Non-pressure chronic ulcer of other part of right foot with fat layer exposed: Secondary | ICD-10-CM

## 2023-09-01 MED ORDER — AMOXICILLIN-POT CLAVULANATE 875-125 MG PO TABS
1.0000 | ORAL_TABLET | Freq: Two times a day (BID) | ORAL | 0 refills | Status: DC
  Filled 2023-09-01: qty 28, 14d supply, fill #0

## 2023-09-01 NOTE — Telephone Encounter (Signed)
[place]

## 2023-09-01 NOTE — Progress Notes (Signed)
 Subjective    Patient ID: [name], female    DOB: 1980-09-22  Age: 43 y.o. MRN: [id number]  CC: Right foot chronic wound   HPI [name] presents to establish care Patient is a 43 year old with a right foot wound. She is status post transmetatarsal amputation. She is currently being followed in the wound center. Patient mentions that she was to be sent to plastics for possible skin graft. This has been a chronic issue, seen by Dr. Marene 2 years ago for the same CC.  Outpatient Encounter Medications as of 09/01/2023  Medication Sig   apixaban  (ELIQUIS ) 5 MG TABS tablet Take 1 tablet (5mg ) by mouth twice daily.   Blood Glucose Monitoring Suppl (TRUE METRIX METER) w/Device KIT Use to check blood sugar twice a day.   Continuous Glucose Sensor (FREESTYLE LIBRE 3 PLUS SENSOR) MISC Use to check blood sugar continuously throughout the day. Change sensor every 15 days.   doxycycline  (VIBRA -TABS) 100 MG tablet Take 1 tablet (100 mg total) by mouth 2 (two) times daily for 14 day.s   gabapentin  (NEURONTIN ) 300 MG capsule Take 2 capsules by mouth 3 times daily.   gentamicin  cream (GARAMYCIN ) 0.1 % Apply 1 Application topically daily to the bed wound   gentamicin  ointment (GARAMYCIN ) 0.1 % Apply 1 Application topically daily.   glucose blood (TRUE METRIX BLOOD GLUCOSE TEST) test strip Use to check blood sugar twice a day.   insulin  glargine, 2 Unit Dial , (TOUJEO  MAX SOLOSTAR) 300 UNIT/ML Solostar Pen Inject 70 Units into the skin daily.   Insulin  Pen Needle (TECHLITE PLUS PEN NEEDLES) 32G X 4 MM MISC use as directed   lisinopril  (ZESTRIL ) 2.5 MG tablet Take 1 tablet (2.5 mg total) by mouth daily.   metFORMIN  (GLUCOPHAGE -XR) 500 MG 24 hr tablet Take 2 tablets (1,000 mg total) by mouth 2 (two) times daily.   oxyCODONE  (OXY IR/ROXICODONE ) 5 MG immediate release tablet Take 5 mg by mouth every 6 (six) hours as needed for moderate pain or severe pain.   Semaglutide , 1 MG/DOSE, 4 MG/3ML SOPN  Inject 1 mg as directed once a week.   Semaglutide ,0.25 or 0.5MG /DOS, (OZEMPIC , 0.25 OR 0.5 MG/DOSE,) 2 MG/3ML SOPN Inject 0.5 mg into the skin once a week for 28 days, then increase to 1 mg once weekly thereafter.   sodium hypochlorite (DAKIN'S 1/2 STRENGTH) external solution Use as directed for wet to dry dressings daily to wound.   triamcinolone  cream (KENALOG ) 0.1 % Apply 1 Application topically daily as needed on areas of skin irritation   TRUEplus Lancets 28G MISC Use to check blood sugar twice a day.   No facility-administered encounter medications on file as of 09/01/2023.    Past Medical History:  Diagnosis Date   Class 3 obesity 12/16/2020   Diabetes mellitus     Past Surgical History:  Procedure Laterality Date   EXTREMITY CYST EXCISION     INCISION AND DRAINAGE OF WOUND Right 10/27/2021   Procedure: IRRIGATION AND DEBRIDEMENT WOUND Foot;  Surgeon: Barton Drape, MD;  Location: [facility];  Service: Orthopedics;  Laterality: Right;   MINOR IRRIGATION AND DEBRIDEMENT OF WOUND Right 03/10/2021   Procedure: MINOR IRRIGATION AND DEBRIDEMENT OF WOUND;  Surgeon: Barton Drape, MD;  Location: [facility];  Service: Orthopedics;  Laterality: Right;   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   Procedure: TRANSMETATARSAL AMPUTATION;  Surgeon: Barton Drape, MD;  Location: [facility];  Service: Orthopedics;  Laterality: Right;    Family History  Problem  Relation Age of Onset   Hypertension Mother    Diabetes Mother    Diabetes Other     Social History   Socioeconomic History   Marital status: Single    Spouse name: Not on file   Number of children: Not on file   Years of education: Not on file   Highest education level: Not on file  Occupational History   Not on file  Tobacco Use   Smoking status: Former    Types: Cigarettes   Smokeless tobacco: Not on file  Vaping Use   Vaping status: Never Used  Substance and Sexual Activity   Alcohol use: Yes    Comment: occ    Drug use: Yes    Types: Marijuana    Comment: occasionally   Sexual activity: Not on file  Other Topics Concern   Not on file  Social History Narrative   Not on file   Social Drivers of Health   Financial Resource Strain: Medium Risk (08/27/2023)   Overall Financial Resource Strain (CARDIA)    Difficulty of Paying Living Expenses: Somewhat hard  Food Insecurity: Food Insecurity Present (08/27/2023)   Hunger Vital Sign    Worried About Running Out of Food in the Last Year: Never true    Ran Out of Food in the Last Year: Sometimes true  Transportation Needs: Unmet Transportation Needs (08/27/2023)   PRAPARE - Administrator, Civil Service (Medical): Yes    Lack of Transportation (Non-Medical): Yes  Physical Activity: Not on file  Stress: Not on file  Social Connections: Not on file  Intimate Partner Violence: Not At Risk (07/03/2023)   Humiliation, Afraid, Rape, and Kick questionnaire    Fear of Current or Ex-Partner: No    Emotionally Abused: No    Physically Abused: No    Sexually Abused: No    Review of Systems  Constitutional: Negative.   HENT: Negative.    Respiratory: Negative.    Cardiovascular: Negative.   Musculoskeletal: Negative.   Neurological: Negative.   Psychiatric/Behavioral: Negative.          Objective    Patient A&O x 3 No acute distresss.   Right lower extremity wound at TME surgical site Wound Bed Tissue Quality: Poor Wound Edges macerated Measurements: 1 cm x 1 cm  Exudate Color and Amount: No exudate Periwound Tissue: dry  No induration or fluctuance: Pain: Minimal Odor: Present Wound or Periwound Edema: Minimal       Procedure: Debridement of right foot wound of devitalized, tissue epidermis, dermis and fat tissues using scissors and forceps down to bleeding tissue. Bleeding noted during debridement and pressure applied. No bleeding at cessation of procedure. Patient tolerated well. Dressing replaced. Wound measurements  before debridement 1 x 1 cm. Wound measurements after debridement 2 cm x 2 cm x 1 cm. Wound assessment performed. Instructions given to patient.       Last CBC Lab Results  Component Value Date   WBC 8.4 11/19/2022   HGB 12.6 11/19/2022   HCT 39.2 11/19/2022   MCV 86.0 11/19/2022   MCH 27.6 11/19/2022   RDW 14.7 11/19/2022   PLT 229 11/19/2022   Last metabolic panel Lab Results  Component Value Date   GLUCOSE CANCELED 03/18/2023   NA 135 01/30/2022   K 4.0 01/30/2022   CL 100 01/30/2022   CO2 25 01/30/2022   BUN 11 01/30/2022   CREATININE 0.79 01/30/2022   GFRNONAA >60 10/31/2021   CALCIUM  9.1 01/30/2022  PROT 6.6 01/30/2022   ALBUMIN 3.2 (L) 10/26/2021   LABGLOB 2.9 10/17/2021   AGRATIO 1.4 10/17/2021   BILITOT 0.3 01/30/2022   ALKPHOS 111 10/26/2021   AST 9 (L) 01/30/2022   ALT 4 (L) 01/30/2022   ANIONGAP 9 10/31/2021   Last lipids Lab Results  Component Value Date   CHOL 210 (H) 10/17/2021   HDL 56 10/17/2021   LDLCALC 126 (H) 10/17/2021   TRIG 159 (H) 10/17/2021   CHOLHDL 3.8 10/17/2021   Last hemoglobin A1c Lab Results  Component Value Date   HGBA1C 10.7 (A) 07/03/2023        Assessment & Plan:  Chronic wound status post transmetatarsal amputation. I think that the tissue in the wound base is not vascular enough to have a good likelihood of take for a skin graft or skin graft substitute. Wound debrided at bedside, all necrotic tissue removed and packing with Vashe started today. Also applied Vaseline to dry skin surrounding wound. Patient is also supposed to wound care after this visit ,appreciate their recommendations. Recommended to discuss adjustment of diabetic shoe and PT with wound care team. We also talked about the importance of stopping smoking and controlling the blood sugars to improve wound healing (Last A1c 10.7). She stated she will work with her PCP on improving these two risk factors for wound healing. I will see her back in a month to  check her progress.   Problem List Items Addressed This Visit       Other   Wound dehiscence, surgical - Primary    Return in about 4 weeks (around 09/29/2023).   Jolane Bankhead M Jakyah Bradby, MD

## 2023-09-02 ENCOUNTER — Other Ambulatory Visit: Payer: Self-pay

## 2023-09-08 ENCOUNTER — Encounter (HOSPITAL_BASED_OUTPATIENT_CLINIC_OR_DEPARTMENT_OTHER): Admitting: Internal Medicine

## 2023-09-08 DIAGNOSIS — E11621 Type 2 diabetes mellitus with foot ulcer: Secondary | ICD-10-CM | POA: Diagnosis not present

## 2023-09-08 DIAGNOSIS — L97512 Non-pressure chronic ulcer of other part of right foot with fat layer exposed: Secondary | ICD-10-CM | POA: Diagnosis not present

## 2023-09-08 DIAGNOSIS — M86671 Other chronic osteomyelitis, right ankle and foot: Secondary | ICD-10-CM

## 2023-09-08 DIAGNOSIS — M25374 Other instability, right foot: Secondary | ICD-10-CM

## 2023-09-09 ENCOUNTER — Telehealth: Payer: Self-pay | Admitting: Primary Care

## 2023-09-09 NOTE — Telephone Encounter (Signed)
 Called patient to confirm upcoming appointment 09/12/2023. Patient appointment has been successfully confirmed

## 2023-09-11 ENCOUNTER — Other Ambulatory Visit: Payer: Self-pay | Admitting: Family Medicine

## 2023-09-12 ENCOUNTER — Ambulatory Visit: Admitting: Pharmacist

## 2023-09-12 ENCOUNTER — Other Ambulatory Visit: Payer: Self-pay

## 2023-09-12 ENCOUNTER — Other Ambulatory Visit (HOSPITAL_COMMUNITY): Payer: Self-pay | Admitting: Internal Medicine

## 2023-09-12 DIAGNOSIS — L97512 Non-pressure chronic ulcer of other part of right foot with fat layer exposed: Secondary | ICD-10-CM

## 2023-09-12 MED ORDER — OZEMPIC (0.25 OR 0.5 MG/DOSE) 2 MG/3ML ~~LOC~~ SOPN
0.5000 mg | PEN_INJECTOR | SUBCUTANEOUS | 0 refills | Status: DC
  Filled 2023-09-12 (×2): qty 3, 28d supply, fill #0

## 2023-09-16 ENCOUNTER — Encounter (HOSPITAL_BASED_OUTPATIENT_CLINIC_OR_DEPARTMENT_OTHER): Admitting: Internal Medicine

## 2023-09-19 ENCOUNTER — Ambulatory Visit (HOSPITAL_BASED_OUTPATIENT_CLINIC_OR_DEPARTMENT_OTHER): Admitting: Internal Medicine

## 2023-09-19 ENCOUNTER — Other Ambulatory Visit: Payer: Self-pay

## 2023-09-19 ENCOUNTER — Ambulatory Visit (HOSPITAL_COMMUNITY)
Admission: RE | Admit: 2023-09-19 | Discharge: 2023-09-19 | Disposition: A | Discharge status: Home / Self Care | Source: Ambulatory Visit | Attending: Internal Medicine | Admitting: Internal Medicine

## 2023-09-19 DIAGNOSIS — L97512 Non-pressure chronic ulcer of other part of right foot with fat layer exposed: Secondary | ICD-10-CM | POA: Insufficient documentation

## 2023-09-19 MED ORDER — GADOBUTROL 1 MMOL/ML IV SOLN
10.0000 mL | Freq: Once | INTRAVENOUS | Status: AC | PRN
  Administered 2023-09-19: 10 mL via INTRAVENOUS

## 2023-09-22 ENCOUNTER — Encounter (HOSPITAL_BASED_OUTPATIENT_CLINIC_OR_DEPARTMENT_OTHER): Attending: Internal Medicine | Admitting: Internal Medicine

## 2023-09-22 DIAGNOSIS — E11621 Type 2 diabetes mellitus with foot ulcer: Secondary | ICD-10-CM | POA: Diagnosis not present

## 2023-09-22 DIAGNOSIS — M86671 Other chronic osteomyelitis, right ankle and foot: Secondary | ICD-10-CM | POA: Insufficient documentation

## 2023-09-22 DIAGNOSIS — Z86711 Personal history of pulmonary embolism: Secondary | ICD-10-CM | POA: Insufficient documentation

## 2023-09-22 DIAGNOSIS — M25374 Other instability, right foot: Secondary | ICD-10-CM | POA: Diagnosis not present

## 2023-09-22 DIAGNOSIS — L97512 Non-pressure chronic ulcer of other part of right foot with fat layer exposed: Secondary | ICD-10-CM | POA: Diagnosis not present

## 2023-09-26 ENCOUNTER — Other Ambulatory Visit: Payer: Self-pay | Admitting: Licensed Clinical Social Worker

## 2023-09-26 DIAGNOSIS — Z794 Long term (current) use of insulin: Secondary | ICD-10-CM

## 2023-09-26 NOTE — Addendum Note (Signed)
 Addended by: EZZARD REMAK D on: 09/26/2023 04:33 PM   Modules accepted: Orders

## 2023-09-26 NOTE — Patient Outreach (Signed)
 Complex Care Management   Visit Note  09/26/2023  Name:  [name] MRN: [id number] DOB: [birth year]  Situation: Referral received for Complex Care Management related to Mental/Behavioral Health diagnosis Depression and Anxiety I obtained verbal consent from Patient.  Visit completed with Patient  on the phone  Background:   Past Medical History:  Diagnosis Date   Class 3 obesity 12/16/2020   Diabetes mellitus     Assessment: Patient Reported Symptoms:  Cognitive Cognitive Status: No symptoms reported, Alert and oriented to person, place, and time, Normal speech and language skills Cognitive/Intellectual Conditions Management [RPT]: None reported or documented in medical history or problem list   Health Maintenance Behaviors: Annual physical exam  Neurological Neurological Review of Symptoms: Not assessed    HEENT HEENT Symptoms Reported: Not assessed      Cardiovascular Cardiovascular Symptoms Reported: Not assessed    Respiratory Respiratory Symptoms Reported: Not assesed    Endocrine Endocrine Symptoms Reported: No symptoms reported Is patient diabetic?: Yes Is patient checking blood sugars at home?: No (Pt has a sensor for arm; however, notes it was removed after recent MRI. Pt plans on re-administering in the next week)    Gastrointestinal Gastrointestinal Symptoms Reported: Not assessed      Genitourinary Genitourinary Symptoms Reported: Not assessed    Integumentary Integumentary Symptoms Reported: Wound Additional Integumentary Details: Pt continues to participate in Wound Care. Has transportation to appts through family Skin Management Strategies: Routine screening, Dressing changes, Coping strategies  Musculoskeletal Musculoskelatal Symptoms Reviewed: Limited mobility        Psychosocial Psychosocial Symptoms Reported: Anxiety - if selected complete GAD, Avoiding people, places, activities, Intrusive thoughts or memories, Phobias Behavioral Management  Strategies: Coping strategies, Complementary therapy(ies) Major Change/Loss/Stressor/Fears (CP): Medical condition, self Techniques to Cope with Loss/Stress/Change: Diversional activities      09/26/2023    PHQ2-9 Depression Screening   Little interest or pleasure in doing things    Feeling down, depressed, or hopeless    PHQ-2 - Total Score    Trouble falling or staying asleep, or sleeping too much    Feeling tired or having little energy    Poor appetite or overeating     Feeling bad about yourself - or that you are a failure or have let yourself or your family down    Trouble concentrating on things, such as reading the newspaper or watching television    Moving or speaking so slowly that other people could have noticed.  Or the opposite - being so fidgety or restless that you have been moving around a lot more than usual    Thoughts that you would be better off dead, or hurting yourself in some way    PHQ2-9 Total Score    If you checked off any problems, how difficult have these problems made it for you to do your work, take care of things at home, or get along with other people    Depression Interventions/Treatment      There were no vitals filed for this visit.  Medications Reviewed Today     Reviewed by Ezzard Rolin BIRCH, LCSW (Social Worker) on 09/26/23 at 1540  Med List Status: <None>   Medication Order Taking? Sig Documenting Provider Last Dose Status Informant  amoxicillin -clavulanate (AUGMENTIN ) 875-125 MG tablet [id number]  Take 1 tablet by mouth 2 (two) times daily for 14 days   Active   apixaban  (ELIQUIS ) 5 MG TABS tablet [id number]  Take 1 tablet (5mg ) by mouth twice daily. Celestia Browning  P, NP  Active Self  Blood Glucose Monitoring Suppl (TRUE METRIX METER) w/Device KIT [id number]  Use to check blood sugar twice a day. Newlin, Enobong, MD  Active Self  Continuous Glucose Sensor (FREESTYLE LIBRE 3 PLUS SENSOR) MISC [id number]  Use to check blood sugar continuously  throughout the day. Change sensor every 15 days. Newlin, Enobong, MD  Active   doxycycline  (VIBRA -TABS) 100 MG tablet [id number]  Take 1 tablet (100 mg total) by mouth 2 (two) times daily for 14 day.s Hoffman, Jessica Ratliff, DO  Active   gabapentin  (NEURONTIN ) 300 MG capsule [id number]  Take 2 capsules by mouth 3 times daily. Celestia Rosaline SQUIBB, NP  Active   gentamicin  cream (GARAMYCIN ) 0.1 % [id number]  Apply 1 Application topically daily to the bed wound   Active   gentamicin  ointment (GARAMYCIN ) 0.1 % [id number]  Apply 1 Application topically daily.   Active Self  glucose blood (TRUE METRIX BLOOD GLUCOSE TEST) test strip [id number]  Use to check blood sugar twice a day. Newlin, Enobong, MD  Active Self  insulin  glargine, 2 Unit Dial , (TOUJEO  MAX SOLOSTAR) 300 UNIT/ML Solostar Pen [id number]  Inject 70 Units into the skin daily. Newlin, Enobong, MD  Active   Insulin  Pen Needle (TECHLITE PLUS PEN NEEDLES) 32G X 4 MM MISC [id number]  use as directed Celestia Rosaline SQUIBB, NP  Active   lisinopril  (ZESTRIL ) 2.5 MG tablet [id number]  Take 1 tablet (2.5 mg total) by mouth daily. Celestia Rosaline SQUIBB, NP  Active   metFORMIN  (GLUCOPHAGE -XR) 500 MG 24 hr tablet [id number]  Take 2 tablets (1,000 mg total) by mouth 2 (two) times daily. Celestia Rosaline SQUIBB, NP  Active   oxyCODONE  (OXY IR/ROXICODONE ) 5 MG immediate release tablet [id number]  Take 5 mg by mouth every 6 (six) hours as needed for moderate pain or severe pain. [provider]  Active Self           Med Note ALLEGRA, [facility] I   Fri Oct 26, 2021  1:46 AM)    Semaglutide , 1 MG/DOSE, 4 MG/3ML SOPN [id number]  Inject 1 mg as directed once a week. Newlin, Enobong, MD  Active   Semaglutide ,0.25 or 0.5MG /DOS, (OZEMPIC , 0.25 OR 0.5 MG/DOSE,) 2 MG/3ML SOPN [id number]  Inject 0.5 mg into the skin once a week for 28 days, then increase to 1 mg once weekly thereafter. Newlin, Enobong, MD  Active   sodium hypochlorite (DAKIN'S 1/2 STRENGTH) external solution  [id number]  Use as directed for wet to dry dressings daily to wound. Rosan Raisin Ratliff, DO  Active   triamcinolone  cream (KENALOG ) 0.1 % [id number]  Apply 1 Application topically daily as needed on areas of skin irritation   Active   TRUEplus Lancets 28G MISC [id number]  Use to check blood sugar twice a day. Newlin, Enobong, MD  Active Self            Recommendation:   PCP Follow-up Continue Current Plan of Care  Follow Up Plan:   Telephone follow-up 2 weeks  Rolin Kerns, LCSW [facility]  [facility], [facility] Clinical Social Worker Direct Dial : [phone]  Fax: [phone] Website: [email or web address] 4:26 PM

## 2023-09-26 NOTE — Patient Instructions (Signed)
 Visit Information  [name] was given information about Medicaid Managed Care team care coordination services as a part of their [employer] Medicaid benefit.   If you would like to schedule transportation through your [employer] [facility] plan, please call the following number at least 2 days in advance of your appointment: [phone]  If you are experiencing a behavioral health crisis, call the [employer] [place]  Behavioral Health Crisis Line at [phone]). The line is available 24 hours a day, seven days a week.   [name] - following are the goals we discussed in your visit today:    Goals Addressed             This Visit's Progress    LCSW VBCI Social Work Care Plan   On track    Problems:   Disease Management support and education needs related to Depression: depressed mood, History of Suicidal Ideation, and Stress at Management of Health Conditions  CSW Clinical Goal(s):   Over the next 90 days the Patient will attend all scheduled medical appointments as evidenced by patient report and care team review of appointment completion in electronic MEDICAL RECORD NUMBER  demonstrate a reduction in symptoms related to Depression: depressed mood History of Suicidal Ideation .  Interventions:  Mental Health:  Evaluation of current treatment plan related to Depression: depressed mood, History of Suicidal Ideation, and Stress at Management of Health Condition Active listening / Reflection utilized Financial risk analyst / information provided Emotional Support Provided Mindfulness or Relaxation training provided Provided general psycho-education for mental health needs Solution-Focued Strategies employed: LCSW discussed strategies to promote self care and positive mood during seasonal depression this fall [name] identified ways to strengthen support system. LCSW discussed different therapy modalities that may benefit [name] in  symptom management [name] agreed to speak with PCP about strategies to assist with management and/or referrals. [name] is not interested in medication management Suicidal Ideation/Homicidal Ideation assessed: Patient denies [name] reports she has not heard from transportation and would like assistance with follow up. AMB referral completed  Patient Goals/Self-Care Activities:  Continue taking your medication as prescribed.   Increase coping skills, healthy habits, and stress reduction  Plan:   Telephone follow up appointment with care management team member scheduled for:  2-4 weeks        Please see education materials related to topics discussed provided by e-mail link.  The patient verbalized understanding of instructions, educational materials, and care plan provided today and DECLINED offer to receive copy of patient instructions, educational materials, and care plan.   Social Worker will call [name] on 10/1 at 3:30 PM.   Rolin Kerns, [facility], [facility] Clinical Social Worker Direct Dial : [phone]  Fax: [phone] Website: [email or web address] 4:27 PM   Following is a copy of your plan of care:  There are no care plans that you recently modified to display for this patient.

## 2023-09-29 ENCOUNTER — Encounter (HOSPITAL_BASED_OUTPATIENT_CLINIC_OR_DEPARTMENT_OTHER): Admitting: Internal Medicine

## 2023-09-29 DIAGNOSIS — E11621 Type 2 diabetes mellitus with foot ulcer: Secondary | ICD-10-CM | POA: Diagnosis not present

## 2023-09-29 DIAGNOSIS — L97512 Non-pressure chronic ulcer of other part of right foot with fat layer exposed: Secondary | ICD-10-CM | POA: Diagnosis not present

## 2023-09-29 DIAGNOSIS — M86671 Other chronic osteomyelitis, right ankle and foot: Secondary | ICD-10-CM

## 2023-09-30 ENCOUNTER — Ambulatory Visit

## 2023-10-01 ENCOUNTER — Telehealth: Payer: Self-pay | Admitting: *Deleted

## 2023-10-01 NOTE — Progress Notes (Signed)
 Complex Care Management Note Care Guide Note  10/01/2023 Name: [name] MRN: [id number] DOB: [birth year]   Complex Care Management Outreach Attempts: An unsuccessful telephone outreach was attempted today to offer the patient information about available complex care management services.  Follow Up Plan:  Additional outreach attempts will be made to offer the patient complex care management information and services.   Encounter Outcome:  No Answer

## 2023-10-03 ENCOUNTER — Telehealth: Payer: Self-pay | Admitting: *Deleted

## 2023-10-03 NOTE — Progress Notes (Signed)
 Complex Care Management Note Care Guide Note  10/03/2023 Name: [name] MRN: [id number] DOB: 01-18-1980  [name] is a 43 y.o. year old female who is a primary care patient of Celestia Rosaline SQUIBB, NP . The community resource team was consulted for assistance with Transportation Needs   SDOH screenings and interventions completed:  Yes     SDOH Interventions Today    Flowsheet Row Most Recent Value  SDOH Interventions   Food Insecurity Interventions Community Resources Provided  [Wants to remail her resources]  Transportation Interventions Community Resources Provided  Financial Strain Interventions Community Resources Provided     Care guide performed the following interventions: Patient provided with information about care guide support team and interviewed to confirm resource needs.  Follow Up Plan:  No further follow up planned at this time. The patient has been provided with needed resources.  Encounter Outcome:  Patient Visit Completed  [name]  [facility] Health[facility] Health Care Guide  Direct Dial :[phone] [phone] Website: [email or web address]

## 2023-10-06 ENCOUNTER — Ambulatory Visit (HOSPITAL_BASED_OUTPATIENT_CLINIC_OR_DEPARTMENT_OTHER): Admitting: Internal Medicine

## 2023-10-06 ENCOUNTER — Ambulatory Visit (INDEPENDENT_AMBULATORY_CARE_PROVIDER_SITE_OTHER): Payer: Self-pay | Admitting: Primary Care

## 2023-10-13 ENCOUNTER — Ambulatory Visit (HOSPITAL_BASED_OUTPATIENT_CLINIC_OR_DEPARTMENT_OTHER): Admitting: Internal Medicine

## 2023-10-14 ENCOUNTER — Ambulatory Visit

## 2023-10-15 ENCOUNTER — Other Ambulatory Visit: Payer: Self-pay | Admitting: Licensed Clinical Social Worker

## 2023-10-16 ENCOUNTER — Other Ambulatory Visit: Payer: Self-pay | Admitting: Family Medicine

## 2023-10-16 NOTE — Patient Instructions (Signed)
 Visit Information  Ms. Radice was given information about Medicaid Managed Care team care coordination services as a part of their [employer] Medicaid benefit.   If you would like to schedule transportation through your [employer] [facility] plan, please call the following number at least 2 days in advance of your appointment: [phone]  If you are experiencing a behavioral health crisis, call the [employer] [place]  Behavioral Health Crisis Line at [phone]). The line is available 24 hours a day, seven days a week.   Please see education materials related to topics discussed provided by MyChart link.  The patient verbalized understanding of instructions, educational materials, and care plan provided today and DECLINED offer to receive copy of patient instructions, educational materials, and care plan.   Licensed Clinical Social Worker will call you on 10/30 at 3:30 PM  Rolin Kerns, LCSW   [facility], [facility] Clinical Social Worker Direct Dial : [phone]  Fax: [phone] Website: [email or web address] 4:47 PM   Following is a copy of your plan of care:  There are no care plans that you recently modified to display for this patient.

## 2023-10-16 NOTE — Patient Outreach (Signed)
 Complex Care Management   Visit Note  10/15/2023  Name:  [name] MRN: [id number] DOB: Jun 05, 1980  Situation: Referral received for Complex Care Management related to Mental/Behavioral Health diagnosis Depression I obtained verbal consent from Patient.  Visit completed with Patient  on the phone  Background:   Past Medical History:  Diagnosis Date   Class 3 obesity 12/16/2020   Diabetes mellitus     Assessment: Patient Reported Symptoms:  Cognitive Cognitive Status: Alert and oriented to person, place, and time, No symptoms reported, Normal speech and language skills Cognitive/Intellectual Conditions Management [RPT]: None reported or documented in medical history or problem list   Health Maintenance Behaviors: Annual physical exam  Neurological Neurological Review of Symptoms: Not assessed    HEENT HEENT Symptoms Reported: Not assessed      Cardiovascular Cardiovascular Symptoms Reported: Not assessed    Respiratory Respiratory Symptoms Reported: Not assesed    Endocrine Endocrine Symptoms Reported: Not assessed    Gastrointestinal Gastrointestinal Symptoms Reported: Not assessed      Genitourinary Genitourinary Symptoms Reported: Not assessed    Integumentary Integumentary Symptoms Reported: Not assessed    Musculoskeletal Musculoskelatal Symptoms Reviewed: Not assessed        Psychosocial Psychosocial Symptoms Reported: Depression - if selected complete PHQ 2-9, Sadness - if selected complete PHQ 2-9, Intrusive thoughts or memories Behavioral Management Strategies: Coping strategies, Adequate rest, Community resources, Personnel officer Health Comment: Pt reported receiving difficulty news a couple of weeks ago regarding MRI. As a result, depression symptoms increased resulting in SI. Pt denies current SI/HI and has crisis intervention resources/safety plan Major Change/Loss/Stressor/Fears (CP): Medical condition, self Techniques to Cope with  Loss/Stress/Change: Diversional activities      10/16/2023    PHQ2-9 Depression Screening   Little interest or pleasure in doing things    Feeling down, depressed, or hopeless    PHQ-2 - Total Score    Trouble falling or staying asleep, or sleeping too much    Feeling tired or having little energy    Poor appetite or overeating     Feeling bad about yourself - or that you are a failure or have let yourself or your family down    Trouble concentrating on things, such as reading the newspaper or watching television    Moving or speaking so slowly that other people could have noticed.  Or the opposite - being so fidgety or restless that you have been moving around a lot more than usual    Thoughts that you would be better off dead, or hurting yourself in some way    PHQ2-9 Total Score    If you checked off any problems, how difficult have these problems made it for you to do your work, take care of things at home, or get along with other people    Depression Interventions/Treatment      There were no vitals filed for this visit.  Medications Reviewed Today     Reviewed by Charisma Charlot D, LCSW (Social Worker) on 10/16/23 at 1629  Med List Status: <None>   Medication Order Taking? Sig Documenting Provider Last Dose Status Informant  amoxicillin -clavulanate (AUGMENTIN ) 875-125 MG tablet [id number]  Take 1 tablet by mouth 2 (two) times daily for 14 days   Active   apixaban  (ELIQUIS ) 5 MG TABS tablet [id number]  Take 1 tablet (5mg ) by mouth twice daily. Celestia Rosaline SQUIBB, NP  Active Self  Blood Glucose Monitoring Suppl (TRUE METRIX METER) w/Device KIT [id number]  Use  to check blood sugar twice a day. Newlin, Enobong, MD  Active Self  Continuous Glucose Sensor (FREESTYLE LIBRE 3 PLUS SENSOR) MISC [id number]  Use to check blood sugar continuously throughout the day. Change sensor every 15 days. Newlin, Enobong, MD  Active   doxycycline  (VIBRA -TABS) 100 MG tablet [id number]  Take 1 tablet (100 mg  total) by mouth 2 (two) times daily for 14 day.s Hoffman, Jessica Ratliff, DO  Active   gabapentin  (NEURONTIN ) 300 MG capsule [id number]  Take 2 capsules by mouth 3 times daily. Celestia Rosaline SQUIBB, NP  Active   gentamicin  cream (GARAMYCIN ) 0.1 % [id number]  Apply 1 Application topically daily to the bed wound   Active   gentamicin  ointment (GARAMYCIN ) 0.1 % [id number]  Apply 1 Application topically daily.   Active Self  glucose blood (TRUE METRIX BLOOD GLUCOSE TEST) test strip [id number]  Use to check blood sugar twice a day. Newlin, Enobong, MD  Active Self  insulin  glargine, 2 Unit Dial , (TOUJEO  MAX SOLOSTAR) 300 UNIT/ML Solostar Pen [id number]  Inject 70 Units into the skin daily. Newlin, Enobong, MD  Active   Insulin  Pen Needle (TECHLITE PLUS PEN NEEDLES) 32G X 4 MM MISC [id number]  use as directed Celestia Rosaline SQUIBB, NP  Active   lisinopril  (ZESTRIL ) 2.5 MG tablet [id number]  Take 1 tablet (2.5 mg total) by mouth daily. Celestia Rosaline SQUIBB, NP  Active   metFORMIN  (GLUCOPHAGE -XR) 500 MG 24 hr tablet [id number]  Take 2 tablets (1,000 mg total) by mouth 2 (two) times daily. Celestia Rosaline SQUIBB, NP  Active   oxyCODONE  (OXY IR/ROXICODONE ) 5 MG immediate release tablet [id number]  Take 5 mg by mouth every 6 (six) hours as needed for moderate pain or severe pain. [provider]  Active Self           Med Note ALLEGRA, [facility] I   Fri Oct 26, 2021  1:46 AM)    Semaglutide , 1 MG/DOSE, 4 MG/3ML SOPN [id number]  Inject 1 mg as directed once a week. Newlin, Enobong, MD  Active   Semaglutide ,0.25 or 0.5MG /DOS, (OZEMPIC , 0.25 OR 0.5 MG/DOSE,) 2 MG/3ML SOPN [id number]  Inject 0.5 mg into the skin once a week for 28 days, then increase to 1 mg once weekly thereafter. Newlin, Enobong, MD  Active   sodium hypochlorite (DAKIN'S 1/2 STRENGTH) external solution [id number]  Use as directed for wet to dry dressings daily to wound. Rosan Raisin Ratliff, DO  Active   triamcinolone  cream (KENALOG ) 0.1 % [id number]   Apply 1 Application topically daily as needed on areas of skin irritation   Active   TRUEplus Lancets 28G MISC [id number]  Use to check blood sugar twice a day. Newlin, Enobong, MD  Active Self            Recommendation:   Continue Current Plan of Care  Follow Up Plan:   Telephone follow-up in 1 month  Rolin Kerns, LCSW [facility]  [facility], [facility] Clinical Social Worker Direct Dial : [phone]  Fax: [phone] Website: [email or web address] 4:46 PM

## 2023-10-17 ENCOUNTER — Telehealth: Payer: Self-pay

## 2023-10-17 ENCOUNTER — Other Ambulatory Visit: Payer: Self-pay

## 2023-10-17 ENCOUNTER — Ambulatory Visit (INDEPENDENT_AMBULATORY_CARE_PROVIDER_SITE_OTHER): Admitting: Infectious Diseases

## 2023-10-17 ENCOUNTER — Encounter: Payer: Self-pay | Admitting: Infectious Diseases

## 2023-10-17 VITALS — BP 122/78 | HR 81 | Wt 327.0 lb

## 2023-10-17 DIAGNOSIS — Z6841 Body Mass Index (BMI) 40.0 and over, adult: Secondary | ICD-10-CM | POA: Diagnosis not present

## 2023-10-17 DIAGNOSIS — Z79899 Other long term (current) drug therapy: Secondary | ICD-10-CM | POA: Diagnosis not present

## 2023-10-17 DIAGNOSIS — L089 Local infection of the skin and subcutaneous tissue, unspecified: Secondary | ICD-10-CM

## 2023-10-17 DIAGNOSIS — M869 Osteomyelitis, unspecified: Secondary | ICD-10-CM

## 2023-10-17 DIAGNOSIS — E11628 Type 2 diabetes mellitus with other skin complications: Secondary | ICD-10-CM

## 2023-10-17 DIAGNOSIS — M86171 Other acute osteomyelitis, right ankle and foot: Secondary | ICD-10-CM | POA: Diagnosis not present

## 2023-10-17 MED ORDER — OZEMPIC (0.25 OR 0.5 MG/DOSE) 2 MG/3ML ~~LOC~~ SOPN
0.5000 mg | PEN_INJECTOR | SUBCUTANEOUS | 0 refills | Status: DC
  Filled 2023-10-17: qty 3, 28d supply, fill #0

## 2023-10-17 MED ORDER — AMOXICILLIN-POT CLAVULANATE 875-125 MG PO TABS
1.0000 | ORAL_TABLET | Freq: Two times a day (BID) | ORAL | 0 refills | Status: DC
  Filled 2023-10-17: qty 28, 14d supply, fill #0

## 2023-10-17 MED ORDER — LINEZOLID 600 MG PO TABS
600.0000 mg | ORAL_TABLET | Freq: Two times a day (BID) | ORAL | 0 refills | Status: AC
  Filled 2023-10-17 (×2): qty 28, 14d supply, fill #0

## 2023-10-17 NOTE — Telephone Encounter (Signed)
 Per DR. Dr. Dea pt needs ABI due to ulcer on heel. No PA needed.  Scheduled with [facility] and V[facility] on 9/9 at 330.  Spoke with pt who is not able to keep that appt. Provided contact info to reschedule ([phone]) Lorenda CHRISTELLA Code, RMA

## 2023-10-17 NOTE — Progress Notes (Addendum)
 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   Diabetic foot infection (HCC) 10/26/2021   Osteomyelitis of right foot (HCC) 03/27/2021   Hyponatremia 03/09/2021   HTN (hypertension) 03/08/2021   Deep vein thrombosis (DVT) of right lower extremity (HCC) 03/08/2021   Wound dehiscence, surgical 02/02/2021   Obesity 02/02/2021   Wound dehiscence 01/24/2021   Medication monitoring encounter 01/02/2021   Diabetic ulcer of right foot associated with diabetes mellitus due to underlying condition (HCC) 01/02/2021   Right foot infection    Osteomyelitis of second toe of right foot (HCC) 12/16/2020   Type 2 diabetes mellitus with hyperglycemia (HCC) 12/16/2020   Class 3 obesity (HCC) 12/16/2020   Hyperlipidemia 12/16/2020   Soft tissue mass 09/15/2015    Patient's Medications  New Prescriptions   No medications on file  Previous Medications   AMOXICILLIN -CLAVULANATE (AUGMENTIN ) 875-125 MG TABLET    Take 1 tablet by mouth 2 (two) times daily for 14 days   APIXABAN  (ELIQUIS ) 5 MG TABS TABLET    Take 1 tablet (5mg ) by mouth twice daily.   BLOOD GLUCOSE MONITORING SUPPL (TRUE METRIX METER) W/DEVICE KIT    Use to check blood sugar twice a day.   CONTINUOUS GLUCOSE SENSOR (FREESTYLE LIBRE 3 PLUS SENSOR) MISC    Use to check blood sugar continuously throughout the day. Change sensor every 15 days.   DOXYCYCLINE  (VIBRA -TABS) 100 MG TABLET    Take 1 tablet (100 mg total) by mouth 2 (two) times daily for 14 day.s   GABAPENTIN  (NEURONTIN ) 300 MG CAPSULE    Take 2 capsules by mouth 3 times daily.   GENTAMICIN  CREAM (GARAMYCIN ) 0.1 %    Apply 1 Application topically daily to the bed wound   GENTAMICIN  OINTMENT (GARAMYCIN ) 0.1 %    Apply 1 Application topically daily.   GLUCOSE BLOOD (TRUE METRIX BLOOD GLUCOSE TEST) TEST STRIP    Use to check blood sugar twice a day.   INSULIN  GLARGINE, 2 UNIT DIAL , (TOUJEO  MAX SOLOSTAR) 300 UNIT/ML SOLOSTAR PEN    Inject 70  Units into the skin daily.   INSULIN  PEN NEEDLE (TECHLITE PLUS PEN NEEDLES) 32G X 4 MM MISC    use as directed   LISINOPRIL  (ZESTRIL ) 2.5 MG TABLET    Take 1 tablet (2.5 mg total) by mouth daily.   METFORMIN  (GLUCOPHAGE -XR) 500 MG 24 HR TABLET    Take 2 tablets (1,000 mg total) by mouth 2 (two) times daily.   OXYCODONE  (OXY IR/ROXICODONE ) 5 MG IMMEDIATE RELEASE TABLET    Take 5 mg by mouth every 6 (six) hours as needed for moderate pain or severe pain.   SEMAGLUTIDE , 1 MG/DOSE, 4 MG/3ML SOPN    Inject 1 mg as directed once a week.   SEMAGLUTIDE ,0.25 OR 0.5MG /DOS, (OZEMPIC , 0.25 OR 0.5 MG/DOSE,) 2 MG/3ML SOPN    Inject 0.5 mg into the skin once a week for 28 days, then increase to 1 mg once weekly thereafter.   SODIUM HYPOCHLORITE (DAKIN'S 1/2 STRENGTH) EXTERNAL SOLUTION    Use as directed for wet to dry dressings daily to wound.   TRIAMCINOLONE  CREAM (KENALOG ) 0.1 %    Apply 1 Application topically daily as needed on areas of skin irritation   TRUEPLUS LANCETS 28G MISC    Use to check blood sugar twice a day.  Modified Medications   No medications on file  Discontinued Medications   No medications on file    Subjective:  Discussed the use of AI scribe software for clinical note transcription with the patient, who gave verbal consent to proceed.   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  She reports chronic wound on her right foot that has persisted for over a year, reopening in June or July of last year. Despite treatments including casting, collagen, silver allergen, and gentamicin , the wound remains unhealed. She has been closely following with wound care. An MRI done on 9/16 concerning for osteomyelitis. Seen by Podiatry , and a bone biopsy/culture was  on 9/29 with results pending. She reports completing  two courses of  Augmentin , each lasting two weeks, with the last dose taken approximately two weeks before the bone biopsy. She is not taking any antibiotics currently. No concerns in her left foot.   Seen by podiatry on 9/29 where RT TMA bone biopsy and aerobic/anaerobic cultures was performed.   She denies any systemic symptoms such as fevers, chills, nausea, vomiting, or diarrhea. Her diabetes is managed with a continuous glucose monitor and is reportedly well-controlled, though blood sugar levels fluctuate with meals.  She uses marijuana, denies tobacco use, and occasionally consumes alcohol. She has limited sensation in her feet.   Review of Systems: All systems reviewed with pertinent positive and negative as listed above  Past Medical History:  Diagnosis Date   Class 3 obesity 12/16/2020   Diabetes mellitus    Past Surgical History:  Procedure Laterality Date   EXTREMITY CYST EXCISION     INCISION AND DRAINAGE OF WOUND Right 10/27/2021   Procedure: IRRIGATION AND DEBRIDEMENT WOUND Foot;  Surgeon: Barton Drape, MD;  Location: [facility];  Service: Orthopedics;  Laterality: Right;   MINOR IRRIGATION AND DEBRIDEMENT OF WOUND Right 03/10/2021   Procedure: MINOR IRRIGATION AND DEBRIDEMENT OF WOUND;  Surgeon: Barton Drape, MD;  Location: [facility];  Service: Orthopedics;  Laterality: Right;   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   Procedure: TRANSMETATARSAL AMPUTATION;  Surgeon: Barton Drape, MD;  Location: [facility];  Service: Orthopedics;  Laterality: Right;     Social History   Tobacco Use   Smoking status: Former    Types: Cigarettes  Vaping Use   Vaping status: Never Used  Substance Use Topics   Alcohol use: Yes    Comment: occ   Drug use: Yes    Types: Marijuana    Comment: occasionally    Family History  Problem Relation Age of Onset   Hypertension Mother    Diabetes Mother    Diabetes Other     No Known Allergies  Health Maintenance  Topic Date Due   FOOT  EXAM  Never done   OPHTHALMOLOGY EXAM  Never done   DTaP/Tdap/Td (1 - Tdap) Never done   Hepatitis B Vaccines 19-59 Average Risk (1 of 3 - 19+ 3-dose series) Never done   HPV VACCINES (1 - Risk 3-dose SCDM series) Never done   Mammogram  Never done   Diabetic kidney evaluation - eGFR measurement  01/31/2023   Influenza Vaccine  Never done   COVID-19 Vaccine (1 - 2024-25 season) Never done   Cervical Cancer Screening (HPV/Pap Cotest)  03/17/2024 (Originally 05/16/2020)   Pneumococcal Vaccine (1 of 2 - PCV) 03/17/2024 (Originally 06/06/2000)   HEMOGLOBIN A1C  01/02/2024   Diabetic kidney evaluation - Urine ACR  07/02/2024  Hepatitis C Screening  Completed   HIV Screening  Completed   Meningococcal B Vaccine  Aged Out    Objective: BP 122/78   Pulse 81   Wt (!) 327 lb (148.3 kg)   SpO2 99%   BMI 48.29 kg/m    Physical Exam Constitutional:      Appearance: Normal appearance. Morbid Obesity   HENT:     Head: Normocephalic and atraumatic.      Mouth: Mucous membranes are moist.  Eyes:    Conjunctiva/sclera: Conjunctivae normal.     Pupils: Pupils are equal, round, and b/l symmetrical    Cardiovascular:     Rate and Rhythm: Normal rate and regular rhythm.     Heart soundss1s2   Pulmonary:     Effort: Pulmonary effort is normal.     Breath sounds: Normal breath sounds.   Abdominal:     General: Non distended     Palpations: soft.   Musculoskeletal:        General: Normal range of motion. Deferred removal of bandage of rt foot due to post op status/no needed supplies in clinic to wrap  back Picture of rt foot prior to biopsy from patient's phone     Picture of rt foot post bone biopsy from patient's phone    Skin:    General: Skin is warm and dry.     Comments:  Neurological:     General: grossly non focal     Mental Status: awake, alert and oriented to person, place, and time.   Psychiatric:        Mood and Affect: Mood normal.   Lab Results Lab Results   Component Value Date   WBC 8.4 11/19/2022   HGB 12.6 11/19/2022   HCT 39.2 11/19/2022   MCV 86.0 11/19/2022   PLT 229 11/19/2022    Lab Results  Component Value Date   CREATININE 0.79 01/30/2022   BUN 11 01/30/2022   NA 135 01/30/2022   K 4.0 01/30/2022   CL 100 01/30/2022   CO2 25 01/30/2022    Lab Results  Component Value Date   ALT 4 (L) 01/30/2022   AST 9 (L) 01/30/2022   ALKPHOS 111 10/26/2021   BILITOT 0.3 01/30/2022    Lab Results  Component Value Date   CHOL 210 (H) 10/17/2021   HDL 56 10/17/2021   LDLCALC 126 (H) 10/17/2021   TRIG 159 (H) 10/17/2021   CHOLHDL 3.8 10/17/2021   No results found for: LABRPR, RPRTITER No results found for: HIV1RNAQUANT, HIV1RNAVL, CD4TABS   Imaging  9/16 MRI RT foot  IMPRESSION: 1. Plantar ulceration underlying the medial foot at the level of the medial cuneiform with acute osteomyelitis of the medial cuneiform. 2. Bone marrow edema within the residual first metatarsal base is also indicative of acute osteomyelitis. 3. No fluid collections.  04/24/23 ABI wnl bilaterally   Assessment/Plan # DFU/Rt foot osteomyelitis    Plan  - will get biopsy and culture reports from podiatry office on 9/29 ( no results seen in care everywhere) and have requested to fax over - Linezolid 600mg  po bid and augmentin  875/125 mg po bid for 2 weeks pending above - CBC, CMP, ESR and CRP today  - ABI - fu in 1 week to review results   # DM2  - fu with PCP for BG control   # Morbid Obesity  - will benefit from weight loss measures   I spent 60 minutes involved in face-to-face and non-face-to-face  activities for this patient on the day of the visit. Professional time spent includes the following activities: Preparing to see the patient (review of tests), Obtaining and reviewing separately obtained history (wound care notes, podiatry note), Performing a medically appropriate examination and evaluation, Ordering medications/labs and  imaging, referring and communicating with other health care professionals, Documenting clinical information in the EMR, Independently interpreting results (not separately reported), Communicating results to the patient, Counseling and educating the patient and Care coordination (not separately reported).   Of note, portions of this note may have been created with voice recognition software. While this note has been edited for accuracy, occasional wrong-word or 'sound-a-like' substitutions may have occurred due to the inherent limitations of voice recognition software.   Annalee Joseph, MD [facility] 10/17/2023, 8:21 AM

## 2023-10-20 ENCOUNTER — Encounter (HOSPITAL_BASED_OUTPATIENT_CLINIC_OR_DEPARTMENT_OTHER): Attending: Internal Medicine | Admitting: Internal Medicine

## 2023-10-20 ENCOUNTER — Other Ambulatory Visit: Payer: Self-pay

## 2023-10-20 DIAGNOSIS — E11621 Type 2 diabetes mellitus with foot ulcer: Secondary | ICD-10-CM | POA: Insufficient documentation

## 2023-10-20 DIAGNOSIS — M86671 Other chronic osteomyelitis, right ankle and foot: Secondary | ICD-10-CM | POA: Insufficient documentation

## 2023-10-20 DIAGNOSIS — M25374 Other instability, right foot: Secondary | ICD-10-CM | POA: Diagnosis not present

## 2023-10-20 DIAGNOSIS — L97512 Non-pressure chronic ulcer of other part of right foot with fat layer exposed: Secondary | ICD-10-CM | POA: Diagnosis not present

## 2023-10-20 DIAGNOSIS — Z86718 Personal history of other venous thrombosis and embolism: Secondary | ICD-10-CM | POA: Diagnosis not present

## 2023-10-20 NOTE — Progress Notes (Signed)
 Medical release faxed for culture results.   [facility] - [facility]  [address]  Jewell [name] [place], [place] [address]  [phone]  [phone] (Fax)

## 2023-10-22 ENCOUNTER — Ambulatory Visit: Payer: Self-pay | Admitting: Infectious Diseases

## 2023-10-22 LAB — CBC
HCT: 38.3 % (ref 35.0–45.0)
Hemoglobin: 12.4 g/dL (ref 11.7–15.5)
MCH: 26.6 pg — ABNORMAL LOW (ref 27.0–33.0)
MCHC: 32.4 g/dL (ref 32.0–36.0)
MCV: 82.2 fL (ref 80.0–100.0)
MPV: 12.1 fL (ref 7.5–12.5)
Platelets: 301 Thousand/uL (ref 140–400)
RBC: 4.66 Million/uL (ref 3.80–5.10)
RDW: 14.4 % (ref 11.0–15.0)
WBC: 8.1 Thousand/uL (ref 3.8–10.8)

## 2023-10-22 LAB — COMPREHENSIVE METABOLIC PANEL WITH GFR
AG Ratio: 1.2 (calc) (ref 1.0–2.5)
ALT: 5 U/L — ABNORMAL LOW (ref 6–29)
AST: 8 U/L — ABNORMAL LOW (ref 10–30)
Albumin: 3.7 g/dL (ref 3.6–5.1)
Alkaline phosphatase (APISO): 120 U/L (ref 31–125)
BUN: 17 mg/dL (ref 7–25)
CO2: 28 mmol/L (ref 20–32)
Calcium: 9.4 mg/dL (ref 8.6–10.2)
Chloride: 100 mmol/L (ref 98–110)
Creat: 0.8 mg/dL (ref 0.50–0.99)
Globulin: 3 g/dL (ref 1.9–3.7)
Glucose, Bld: 198 mg/dL — ABNORMAL HIGH (ref 65–99)
Potassium: 4.2 mmol/L (ref 3.5–5.3)
Sodium: 138 mmol/L (ref 135–146)
Total Bilirubin: 0.3 mg/dL (ref 0.2–1.2)
Total Protein: 6.7 g/dL (ref 6.1–8.1)
eGFR: 94 mL/min/1.73m2 (ref >=60)

## 2023-10-22 LAB — SEDIMENTATION RATE: Sed Rate: 62 mm/h — ABNORMAL HIGH (ref 0–20)

## 2023-10-22 LAB — C-REACTIVE PROTEIN: CRP: 46.8 mg/L — ABNORMAL HIGH (ref <8.0)

## 2023-10-23 ENCOUNTER — Ambulatory Visit: Admitting: Internal Medicine

## 2023-10-23 ENCOUNTER — Encounter (HOSPITAL_COMMUNITY)

## 2023-10-23 ENCOUNTER — Ambulatory Visit (HOSPITAL_COMMUNITY): Admission: RE | Admit: 2023-10-23 | Source: Ambulatory Visit

## 2023-10-27 ENCOUNTER — Ambulatory Visit (HOSPITAL_COMMUNITY)
Admission: RE | Admit: 2023-10-27 | Discharge: 2023-10-27 | Disposition: A | Discharge status: Home / Self Care | Source: Ambulatory Visit | Attending: Infectious Diseases | Admitting: Infectious Diseases

## 2023-10-27 DIAGNOSIS — E11628 Type 2 diabetes mellitus with other skin complications: Secondary | ICD-10-CM | POA: Insufficient documentation

## 2023-10-27 DIAGNOSIS — L089 Local infection of the skin and subcutaneous tissue, unspecified: Secondary | ICD-10-CM | POA: Insufficient documentation

## 2023-10-27 LAB — VAS US ABI WITH/WO TBI
Left ABI: 0.99
Right ABI: 1

## 2023-10-28 ENCOUNTER — Other Ambulatory Visit: Payer: Self-pay

## 2023-10-28 ENCOUNTER — Ambulatory Visit (HOSPITAL_BASED_OUTPATIENT_CLINIC_OR_DEPARTMENT_OTHER): Admitting: Internal Medicine

## 2023-10-28 ENCOUNTER — Ambulatory Visit: Attending: Primary Care | Admitting: Pharmacist

## 2023-10-28 ENCOUNTER — Encounter: Payer: Self-pay | Admitting: Pharmacist

## 2023-10-28 DIAGNOSIS — Z7984 Long term (current) use of oral hypoglycemic drugs: Secondary | ICD-10-CM | POA: Diagnosis not present

## 2023-10-28 DIAGNOSIS — E1165 Type 2 diabetes mellitus with hyperglycemia: Secondary | ICD-10-CM | POA: Diagnosis not present

## 2023-10-28 DIAGNOSIS — Z794 Long term (current) use of insulin: Secondary | ICD-10-CM | POA: Diagnosis not present

## 2023-10-28 DIAGNOSIS — Z7985 Long-term (current) use of injectable non-insulin antidiabetic drugs: Secondary | ICD-10-CM

## 2023-10-28 LAB — POCT GLYCOSYLATED HEMOGLOBIN (HGB A1C): HbA1c, POC (controlled diabetic range): 9.4 % — AB (ref 0.0–7.0)

## 2023-10-28 MED ORDER — SEMAGLUTIDE (2 MG/DOSE) 8 MG/3ML ~~LOC~~ SOPN
2.0000 mg | PEN_INJECTOR | SUBCUTANEOUS | 1 refills | Status: AC
  Filled 2023-10-28: qty 9, fill #0
  Filled 2023-11-21: qty 9, 84d supply, fill #0

## 2023-10-28 NOTE — Progress Notes (Signed)
 S:    [name] is a 43 y.o. female who presents for diabetes evaluation, education, and management.   PMH is significant for HTN, T2DM, diabetic foot infection and osteomyelitis, HLD, obesity, hx of DVT, hx of hyponatremia. Patient was referred and last seen by Primary Care Provider, [name], on 07/03/2023. We last saw her in July of this year. At that visit, we changed Basaglar  to Toujeo  for better absorption. We also stopped Trulicity  and changed this to Ozempic .  Since her last visit, she has been followed by Wound Care. Last visit with them was on 09/29/2023. Also was seen by Podiatry on 10/08/23 and had bone biopsy for cultures due to concern of R foot ulcer and osteomyelitis. Then saw ID on 10/17/23. Bone biopsy/culture resulted, Augmentin  was changed to Linezolid.  Today, patient arrives in good spirits and presents without any assistance. Regarding her DM, she denies any N/V, abdominal pain, or changes in vision. She is taking the Ozempic , Touejo, and metformin  well.   Family/Social History: former smoker; family hx of DM and HTN  Current diabetes medications include: Ozempic  0.5mg  weekly, Toujeo  70 units daily, metformin  1000mg  BID Patient reports adherence to taking all medications as prescribed.   Insurance coverage: Medicaid   Patient denies hypoglycemic events.  Patient reports polyuria, polydipsia.  Patient endorses neuropathy (nerve pain).  Patient denies visual changes.  Patient reports self foot exams.   Patient reported dietary habits:  - Avoids sweets most of the time, tries to limit starches and carbs but admits to continued dietary indiscretion overall.  - Admits that she needs to do better  - Drinks mostly water  Patient-reported exercise habits: - Tries to walk as much as possible but is still limited overall   O:  Date of Download: 10/28/2023, 14-day report % Time CGM is active: 100% Average Glucose: 178 mg/dL Glucose Management Indicator:  7.8  Glucose Variability: NA (goal <36%) Time in Goal:  - Time in range 70-180: 64% - Time above range: 36% - Time below range: 0% Lab Results  Component Value Date   HGBA1C 9.4 (A) 10/28/2023   There were no vitals filed for this visit.  Lipid Panel     Component Value Date/Time   CHOL 210 (H) 10/17/2021 1159   TRIG 159 (H) 10/17/2021 1159   HDL 56 10/17/2021 1159   CHOLHDL 3.8 10/17/2021 1159   CHOLHDL 7.1 12/20/2020 0434   VLDL 21 12/20/2020 0434   LDLCALC 126 (H) 10/17/2021 1159   Clinical Atherosclerotic Cardiovascular Disease (ASCVD): No  The 10-year ASCVD risk score (Arnett DK, et al., 2019) is: 3.3%   Values used to calculate the score:     Age: 70 years     Clincally relevant sex: Female     Is Non-Hispanic African American: Yes     Diabetic: Yes     Tobacco smoker: No     Systolic Blood Pressure: 122 mmHg     Is BP treated: Yes     HDL Cholesterol: 56 mg/dL     Total Cholesterol: 210 mg/dL   Patient is participating in a Managed Medicaid Plan:  Yes   A/P: Diabetes longstanding, currently above goal but improving. Patient is able to verbalize appropriate hypoglycemia management plan. Medication adherence appears optimal.  -Continue Toujeo  70 units daily.  -Continue Ozempic  1 mg weekly for another 3 doses. Then, increase to Ozempic  2 mg weekly.  -Continued metformin  1000mg  BID.  -Extensively discussed pathophysiology of diabetes, recommended lifestyle interventions,  dietary effects on blood sugar control.  -Counseled on s/sx of and management of hypoglycemia.  -Asked patient to take more BG readings at different times throughout the day and bring readings to next appointment.  -Next A1c anticipated 01/2024.  Written patient instructions provided. Patient verbalized understanding of treatment plan.  Total time in face to face counseling 30 minutes.    Follow-up:  Pharmacist visit in 1-1.5 months.  [name], PharmD, [name], CPP Clinical  Pharmacist [facility] [phone]

## 2023-11-03 ENCOUNTER — Ambulatory Visit (HOSPITAL_BASED_OUTPATIENT_CLINIC_OR_DEPARTMENT_OTHER): Admitting: Internal Medicine

## 2023-11-04 ENCOUNTER — Encounter (HOSPITAL_BASED_OUTPATIENT_CLINIC_OR_DEPARTMENT_OTHER): Admitting: Internal Medicine

## 2023-11-04 DIAGNOSIS — L97512 Non-pressure chronic ulcer of other part of right foot with fat layer exposed: Secondary | ICD-10-CM

## 2023-11-04 DIAGNOSIS — E11621 Type 2 diabetes mellitus with foot ulcer: Secondary | ICD-10-CM | POA: Diagnosis not present

## 2023-11-06 ENCOUNTER — Other Ambulatory Visit: Payer: Self-pay

## 2023-11-06 ENCOUNTER — Ambulatory Visit (INDEPENDENT_AMBULATORY_CARE_PROVIDER_SITE_OTHER): Admitting: Internal Medicine

## 2023-11-06 ENCOUNTER — Encounter: Payer: Self-pay | Admitting: Internal Medicine

## 2023-11-06 VITALS — BP 116/77 | HR 94 | Temp 99.0°F

## 2023-11-06 DIAGNOSIS — M869 Osteomyelitis, unspecified: Secondary | ICD-10-CM

## 2023-11-06 MED ORDER — AMOXICILLIN-POT CLAVULANATE 875-125 MG PO TABS
1.0000 | ORAL_TABLET | Freq: Two times a day (BID) | ORAL | 0 refills | Status: AC
  Filled 2023-11-06: qty 56, 28d supply, fill #0

## 2023-11-06 MED ORDER — DOXYCYCLINE HYCLATE 100 MG PO TABS
100.0000 mg | ORAL_TABLET | Freq: Two times a day (BID) | ORAL | 0 refills | Status: AC
  Filled 2023-11-06: qty 56, 28d supply, fill #0

## 2023-11-06 NOTE — Progress Notes (Addendum)
 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 F with PHX as below presents for hospital f/u of rt foot osteomyelitis.  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   She reports chronic wound on her right foot that has persisted for over a year, reopening in June or July of last year. Despite treatments including casting, collagen, silver allergen, and gentamicin , the wound remains unhealed. She has been closely following with wound care. An MRI done on 9/16 concerning for osteomyelitis. Seen by Podiatry , and a bone biopsy/culture was  on 9/29 with results pending. She reports completing  two courses of Augmentin , each lasting two weeks, with the last dose taken  approximately two weeks before the bone biopsy. She is not taking any antibiotics currently. No concerns in her left foot.    Seen by podiatry on 9/29 where RT TMA bone biopsy and aerobic/anaerobic cultures was performed.    She denies any systemic symptoms such as fevers, chills, nausea, vomiting, or diarrhea. Her diabetes is managed with a continuous glucose monitor and is reportedly well-controlled, though blood sugar levels fluctuate with meals.   She uses marijuana, denies tobacco use, and occasionally consumes alcohol. She has limited sensation in her feet.  Review of Systems  All other systems reviewed and are negative.   Past Medical History:  Diagnosis Date   Class 3 obesity (HCC) 12/16/2020   Diabetes mellitus     Outpatient Medications Prior to Visit  Medication Sig Dispense Refill   apixaban  (ELIQUIS ) 5 MG TABS tablet Take 1 tablet (5mg ) by mouth twice daily. 180 tablet 1   Continuous Glucose Sensor (FREESTYLE LIBRE 3 PLUS SENSOR) MISC Use to check blood sugar continuously throughout the day. Change sensor every 15 days. 2 each 6   glucose blood (TRUE METRIX BLOOD GLUCOSE TEST) test strip Use to check blood sugar twice a day. 100 each 2   lisinopril  (ZESTRIL ) 2.5 MG tablet Take 1 tablet (2.5 mg total) by mouth daily. 90 tablet 1   metFORMIN  (GLUCOPHAGE -XR) 500 MG 24 hr tablet Take 2 tablets (1,000 mg total) by mouth 2 (two) times daily. 120 tablet 2   oxyCODONE  (OXY IR/ROXICODONE ) 5 MG immediate release tablet Take 5 mg by mouth  every 6 (six) hours as needed for moderate pain or severe pain.     Semaglutide , 2 MG/DOSE, 8 MG/3ML SOPN Inject 2 mg as directed once a week. 9 mL 1   amoxicillin -clavulanate (AUGMENTIN ) 875-125 MG tablet Take 1 tablet by mouth 2 (two) times daily. (Patient not taking: Reported on 11/06/2023) 28 tablet 0   Blood Glucose Monitoring Suppl (TRUE METRIX METER) w/Device KIT Use to check blood sugar twice a day. 1 kit 0   gabapentin  (NEURONTIN ) 300 MG  capsule Take 2 capsules by mouth 3 times daily. 180 capsule 2   gentamicin  cream (GARAMYCIN ) 0.1 % Apply 1 Application topically daily to the bed wound (Patient not taking: Reported on 10/17/2023) 30 g 1   gentamicin  ointment (GARAMYCIN ) 0.1 % Apply 1 Application topically daily. (Patient not taking: Reported on 10/17/2023) 30 g 0   insulin  glargine, 2 Unit Dial , (TOUJEO  MAX SOLOSTAR) 300 UNIT/ML Solostar Pen Inject 70 Units into the skin daily. 9 mL 2   Insulin  Pen Needle (TECHLITE PLUS PEN NEEDLES) 32G X 4 MM MISC use as directed 100 each 2   sodium hypochlorite (DAKIN'S 1/2 STRENGTH) external solution Use as directed for wet to dry dressings daily to wound. (Patient not taking: Reported on 11/06/2023) 473 mL 2   triamcinolone  cream (KENALOG ) 0.1 % Apply 1 Application topically daily as needed on areas of skin irritation (Patient not taking: Reported on 11/06/2023) 30 g 0   TRUEplus Lancets 28G MISC Use to check blood sugar twice a day. 100 each 2   No facility-administered medications prior to visit.     No Known Allergies  Social History   Tobacco Use   Smoking status: Former    Types: Cigarettes  Vaping Use   Vaping status: Never Used  Substance Use Topics   Alcohol use: Yes    Comment: occ   Drug use: Yes    Types: Marijuana    Comment: occasionally    Family History  Problem Relation Age of Onset   Hypertension Mother    Diabetes Mother    Diabetes Other     Objective:   Vitals:   11/06/23 1408  BP: 116/77  Pulse: 94  Temp: 99 F (37.2 C)  TempSrc: Oral  SpO2: 96%   There is no height or weight on file to calculate BMI.  Physical Exam Constitutional:      Appearance: Normal appearance.  HENT:     Head: Normocephalic and atraumatic.     Right Ear: Tympanic membrane normal.     Left Ear: Tympanic membrane normal.     Nose: Nose normal.     Mouth/Throat:     Mouth: Mucous membranes are moist.  Eyes:     Extraocular Movements: Extraocular movements intact.      Conjunctiva/sclera: Conjunctivae normal.     Pupils: Pupils are equal, round, and reactive to light.  Cardiovascular:     Rate and Rhythm: Normal rate and regular rhythm.     Heart sounds: No murmur heard.    No friction rub. No gallop.  Pulmonary:     Effort: Pulmonary effort is normal.     Breath sounds: Normal breath sounds.  Abdominal:     General: Abdomen is flat.     Palpations: Abdomen is soft.  Musculoskeletal:        General: Normal range of motion.  Skin:    General: Skin is warm and dry.  Neurological:     General: No focal deficit present.  Mental Status: She is alert and oriented to person, place, and time.  Psychiatric:        Mood and Affect: Mood normal.     Lab Results: Lab Results  Component Value Date   WBC 8.1 10/17/2023   HGB 12.4 10/17/2023   HCT 38.3 10/17/2023   MCV 82.2 10/17/2023   PLT 301 10/17/2023    Lab Results  Component Value Date   CREATININE 0.80 10/17/2023   BUN 17 10/17/2023   NA 138 10/17/2023   K 4.2 10/17/2023   CL 100 10/17/2023   CO2 28 10/17/2023    Lab Results  Component Value Date   ALT 5 (L) 10/17/2023   AST 8 (L) 10/17/2023   ALKPHOS 111 10/26/2021   BILITOT 0.3 10/17/2023          Assessment & Plan:  # DFU/Rt foot osteomyelitis  - Reviewed biopsy noted NG on cx - ar last visit pt placed on Linezolid 600mg  po bid and augmentin  875/125 mg po bid for 2 weeks. 10/3 labs noted crp 46.8, esr  elevated. Pt Pt noted that wound improved with 2 week sof abx. Last dose of abx 3 days ago - ABI on 10/13 show normal right ABI, normal resting left ABI, left toe brachial index abnormal with(>60) toe pressure with suggestion of adequate perfusion for healing.  # DM2  - fu with PCP for BG control    # Morbid Obesity  - will benefit from weight loss measures   Plan: Continue podiatry f/u Start  doxy and   augmentin  for another 4 weeks to complete course for osteo. Although biopsy ng, inflammatory markers  elevated and pt noted improvement on abx.  Labs today F/u in one month with Dr. Berneda Loney Stank, MD [facility]   11/06/23  2:33 PM  I have personally spent 45 minutes involved in face-to-face and non-face-to-face activities for this patient on the day of the visit. Professional time spent includes the following activities: Preparing to see the patient (review of tests), Obtaining and/or reviewing separately obtained history (admission/discharge record), Performing a medically appropriate examination and/or evaluation , Ordering medications/tests/procedures, referring and communicating with other health care professionals, Documenting clinical information in the EMR, Independently interpreting results (not separately reported), Communicating results to the patient/family/caregiver, Counseling and educating the patient/family/caregiver and Care coordination (not separately reported).

## 2023-11-06 NOTE — Patient Instructions (Signed)
 Continue podiatry f/u Another 4 weeks of abx with doxy and augmentin  Labs F/u in one month with Dr. Berneda

## 2023-11-10 ENCOUNTER — Other Ambulatory Visit: Payer: Self-pay

## 2023-11-10 ENCOUNTER — Other Ambulatory Visit

## 2023-11-10 DIAGNOSIS — M869 Osteomyelitis, unspecified: Secondary | ICD-10-CM

## 2023-11-11 LAB — SEDIMENTATION RATE: Sed Rate: 46 mm/h — ABNORMAL HIGH (ref 0–20)

## 2023-11-11 LAB — C-REACTIVE PROTEIN: CRP: 18.8 mg/L — ABNORMAL HIGH (ref <8.0)

## 2023-11-13 ENCOUNTER — Other Ambulatory Visit: Payer: Self-pay | Admitting: Licensed Clinical Social Worker

## 2023-11-14 ENCOUNTER — Other Ambulatory Visit: Payer: Self-pay

## 2023-11-18 ENCOUNTER — Encounter (HOSPITAL_BASED_OUTPATIENT_CLINIC_OR_DEPARTMENT_OTHER): Attending: Internal Medicine | Admitting: Internal Medicine

## 2023-11-18 DIAGNOSIS — E11621 Type 2 diabetes mellitus with foot ulcer: Secondary | ICD-10-CM

## 2023-11-18 DIAGNOSIS — L97512 Non-pressure chronic ulcer of other part of right foot with fat layer exposed: Secondary | ICD-10-CM

## 2023-11-21 ENCOUNTER — Other Ambulatory Visit: Payer: Self-pay

## 2023-11-21 NOTE — Patient Outreach (Signed)
 Complex Care Management   Visit Note  11/13/2023  Name:  [name] MRN: [id number] DOB: 1980-09-30  Situation: Referral received for Complex Care Management related to Mental/Behavioral Health diagnosis Depression I obtained verbal consent from Patient.  Visit completed with Patient  on the phone  Background:   Past Medical History:  Diagnosis Date   Class 3 obesity (HCC) 12/16/2020   Diabetes mellitus     Assessment: Patient Reported Symptoms:  Cognitive Cognitive Status: No symptoms reported, Alert and oriented to person, place, and time, Normal speech and language skills Cognitive/Intellectual Conditions Management [RPT]: None reported or documented in medical history or problem list      Neurological Neurological Review of Symptoms: No symptoms reported    HEENT HEENT Symptoms Reported: No symptoms reported      Cardiovascular Cardiovascular Symptoms Reported: No symptoms reported    Respiratory Respiratory Symptoms Reported: No symptoms reported    Endocrine Endocrine Symptoms Reported: No symptoms reported Is patient diabetic?: Yes Is patient checking blood sugars at home?: Yes Endocrine Comment: Pt endorses improved managment of BS due to sensor  Gastrointestinal Gastrointestinal Symptoms Reported: No symptoms reported      Genitourinary Genitourinary Symptoms Reported: No symptoms reported    Integumentary Integumentary Symptoms Reported: Wound Additional Integumentary Details: Patient reports bone cultures showed no infection. She continues to take antibiotics and visits wound care center Skin Management Strategies: Coping strategies, Medication therapy, Routine screening  Musculoskeletal Musculoskelatal Symptoms Reviewed: Not assessed Musculoskeletal Management Strategies: Coping strategies      Psychosocial Psychosocial Symptoms Reported: No symptoms reported Additional Psychological Details: Pt reports improvement of management of symptoms. Practicing  gratitude thinking and prioritizing rest Behavioral Management Strategies: Coping strategies, Support system Major Change/Loss/Stressor/Fears (CP): Medical condition, self Techniques to Cope with Loss/Stress/Change: Diversional activities      11/21/2023    PHQ2-9 Depression Screening   Little interest or pleasure in doing things    Feeling down, depressed, or hopeless    PHQ-2 - Total Score    Trouble falling or staying asleep, or sleeping too much    Feeling tired or having little energy    Poor appetite or overeating     Feeling bad about yourself - or that you are a failure or have let yourself or your family down    Trouble concentrating on things, such as reading the newspaper or watching television    Moving or speaking so slowly that other people could have noticed.  Or the opposite - being so fidgety or restless that you have been moving around a lot more than usual    Thoughts that you would be better off dead, or hurting yourself in some way    PHQ2-9 Total Score    If you checked off any problems, how difficult have these problems made it for you to do your work, take care of things at home, or get along with other people    Depression Interventions/Treatment      There were no vitals filed for this visit.  Medications Reviewed Today     Reviewed by Ezzard Rolin BIRCH, LCSW (Social Worker) on 11/21/23 at [phone]  Med List Status: <None>   Medication Order Taking? Sig Documenting Provider Last Dose Status Informant  amoxicillin -clavulanate (AUGMENTIN ) 875-125 MG tablet [id number]  Take 1 tablet by mouth 2 (two) times daily for 28 days. Dennise Kingsley, MD  Active   apixaban  (ELIQUIS ) 5 MG TABS tablet [id number]  Take 1 tablet (5mg ) by mouth twice daily. Celestia,  Rosaline SQUIBB, NP  Active Self  Blood Glucose Monitoring Suppl (TRUE METRIX METER) w/Device KIT [id number]  Use to check blood sugar twice a day. Newlin, Enobong, MD  Active Self  Continuous Glucose Sensor (FREESTYLE LIBRE 3  PLUS SENSOR) MISC [id number]  Use to check blood sugar continuously throughout the day. Change sensor every 15 days. Newlin, Enobong, MD  Active   doxycycline  (VIBRA -TABS) 100 MG tablet [id number]  Take 1 tablet (100 mg total) by mouth 2 (two) times daily for 28 days. Dennise Kingsley, MD  Active   gabapentin  (NEURONTIN ) 300 MG capsule [id number]  Take 2 capsules by mouth 3 times daily. Celestia Rosaline SQUIBB, NP  Active   gentamicin  cream (GARAMYCIN ) 0.1 % [id number]  Apply 1 Application topically daily to the bed wound  Patient not taking: Reported on 10/17/2023     Active   gentamicin  ointment (GARAMYCIN ) 0.1 % [id number]  Apply 1 Application topically daily.  Patient not taking: Reported on 10/17/2023     Active Self  glucose blood (TRUE METRIX BLOOD GLUCOSE TEST) test strip [id number]  Use to check blood sugar twice a day. Newlin, Enobong, MD  Active Self  insulin  glargine, 2 Unit Dial , (TOUJEO  MAX SOLOSTAR) 300 UNIT/ML Solostar Pen [id number]  Inject 70 Units into the skin daily. Newlin, Enobong, MD  Active   Insulin  Pen Needle (TECHLITE PLUS PEN NEEDLES) 32G X 4 MM MISC [id number]  use as directed Celestia Rosaline SQUIBB, NP  Active   lisinopril  (ZESTRIL ) 2.5 MG tablet [id number]  Take 1 tablet (2.5 mg total) by mouth daily. Celestia Rosaline SQUIBB, NP  Active   metFORMIN  (GLUCOPHAGE -XR) 500 MG 24 hr tablet [id number]  Take 2 tablets (1,000 mg total) by mouth 2 (two) times daily. Celestia Rosaline SQUIBB, NP  Active   oxyCODONE  (OXY IR/ROXICODONE ) 5 MG immediate release tablet [id number]  Take 5 mg by mouth every 6 (six) hours as needed for moderate pain or severe pain. [provider]  Active Self           Med Note ALLEGRA, [facility]   Fri Oct 26, 2021  1:46 AM)    Semaglutide , 2 MG/DOSE, 8 MG/3ML SOPN [id number]  Inject 2 mg as directed once a week. Newlin, Enobong, MD  Active   sodium hypochlorite (DAKIN'S 1/2 STRENGTH) external solution [id number]  Use as directed for wet to dry dressings daily to wound.   Patient not taking: Reported on 11/06/2023   Rosan Raisin Ratliff, DO  Active   triamcinolone  cream (KENALOG ) 0.1 % [id number]  Apply 1 Application topically daily as needed on areas of skin irritation  Patient not taking: Reported on 11/06/2023     Active   TRUEplus Lancets 28G MISC [id number]  Use to check blood sugar twice a day. Newlin, Enobong, MD  Active Self            Recommendation:   Continue Current Plan of Care  Follow Up Plan:   Telephone follow-up in 1 month  Rolin Kerns, LCSW [facility]  [facility], [facility] Clinical Social Worker Direct Dial : [phone]  Fax: [phone] Website: [email or web address] 10:06 AM

## 2023-11-21 NOTE — Patient Instructions (Signed)
 Visit Information  [name] was given information about Medicaid Managed Care team care coordination services as a part of their [employer] Medicaid benefit.   If you would like to schedule transportation through your [employer] [facility] plan, please call the following number at least 2 days in advance of your appointment: [phone]  If you are experiencing a behavioral health crisis, call the [employer]   Behavioral Health Crisis Line at [phone]). The line is available 24 hours a day, seven days a week.    Please see education materials related to topics discussed provided by MyChart link.  Care plan and visit instructions communicated with the patient verbally today. Patient agrees to receive a copy in MyChart. Active MyChart status and patient understanding of how to access instructions and care plan via MyChart confirmed with patient.     Licensed Clinical Social Worker will call on 11/17 at 3:30 PM  Rolin Kerns, LCSW [place]  [facility], [facility] Clinical Social Worker Direct Dial : [phone]  Fax: [phone] Website: [email or web address] 10:07 AM   Following is a copy of your plan of care:  There are no care plans that you recently modified to display for this patient.

## 2023-11-24 ENCOUNTER — Other Ambulatory Visit: Payer: Self-pay

## 2023-11-26 ENCOUNTER — Other Ambulatory Visit: Payer: Self-pay

## 2023-12-01 ENCOUNTER — Other Ambulatory Visit: Payer: Self-pay | Admitting: Licensed Clinical Social Worker

## 2023-12-01 NOTE — Patient Outreach (Signed)
 Complex Care Management   Visit Note  12/01/2023  Name:  [name] MRN: [id number] DOB: June 29, 1980  Situation: Referral received for Complex Care Management related to Depression I obtained verbal consent from Patient.  Visit completed with Patient  on the phone  Background:   Past Medical History:  Diagnosis Date   Class 3 obesity (HCC) 12/16/2020   Diabetes mellitus     Assessment: Patient Reported Symptoms:  Cognitive Cognitive Status: No symptoms reported, Alert and oriented to person, place, and time, Normal speech and language skills   Health Maintenance Behaviors: Annual physical exam  Neurological Neurological Review of Symptoms: No symptoms reported    HEENT HEENT Symptoms Reported: Not assessed      Cardiovascular Cardiovascular Symptoms Reported: Not assessed    Respiratory Respiratory Symptoms Reported: Not assesed    Endocrine Endocrine Symptoms Reported: Not assessed    Gastrointestinal Gastrointestinal Symptoms Reported: Not assessed      Genitourinary Genitourinary Symptoms Reported: Not assessed    Integumentary Integumentary Symptoms Reported: No symptoms reported Additional Integumentary Details: Pt reports that she is [facility] on [facility]02/23 Skin Management Strategies: Coping strategies, Medication therapy, Routine screening  Musculoskeletal Musculoskelatal Symptoms Reviewed: No symptoms reported        Psychosocial Psychosocial Symptoms Reported: No symptoms reported Additional Psychological Details: Pt reports doing well with management of symptoms, however, is concerned about grief during holidays. Strategies discussed to cope Behavioral Management Strategies: Coping strategies, Adequate rest, Support system Major Change/Loss/Stressor/Fears (CP): Medical condition, self Techniques to Cope with Loss/Stress/Change: Diversional activities      12/01/2023    PHQ2-9 Depression Screening   Little interest or pleasure in  doing things    Feeling down, depressed, or hopeless    PHQ-2 - Total Score    Trouble falling or staying asleep, or sleeping too much    Feeling tired or having little energy    Poor appetite or overeating     Feeling bad about yourself - or that you are a failure or have let yourself or your family down    Trouble concentrating on things, such as reading the newspaper or watching television    Moving or speaking so slowly that other people could have noticed.  Or the opposite - being so fidgety or restless that you have been moving around a lot more than usual    Thoughts that you would be better off dead, or hurting yourself in some way    PHQ2-9 Total Score    If you checked off any problems, how difficult have these problems made it for you to do your work, take care of things at home, or get along with other people    Depression Interventions/Treatment      There were no vitals filed for this visit.    Medications Reviewed Today     Reviewed by Ezzard Rolin BIRCH, LCSW (Social Worker) on 12/01/23 at 1546  Med List Status: <None>   Medication Order Taking? Sig Documenting Provider Last Dose Status Informant  amoxicillin -clavulanate (AUGMENTIN ) 875-125 MG tablet [id number]  Take 1 tablet by mouth 2 (two) times daily for 28 days. Dennise Kingsley, MD  Active   apixaban  (ELIQUIS ) 5 MG TABS tablet [id number]  Take 1 tablet (5mg ) by mouth twice daily. Celestia Rosaline SQUIBB, NP  Active Self  Blood Glucose Monitoring Suppl (TRUE METRIX METER) w/Device KIT [id number]  Use to check blood sugar twice a day. Newlin, Enobong, MD  Active Self  Continuous Glucose Sensor (  FREESTYLE LIBRE 3 PLUS SENSOR) MISC [id number]  Use to check blood sugar continuously throughout the day. Change sensor every 15 days. Newlin, Enobong, MD  Active   doxycycline  (VIBRA -TABS) 100 MG tablet [id number]  Take 1 tablet (100 mg total) by mouth 2 (two) times daily for 28 days. Dennise Kingsley, MD  Active   gabapentin  (NEURONTIN ) 300  MG capsule [id number]  Take 2 capsules by mouth 3 times daily. Celestia Rosaline SQUIBB, NP  Active   gentamicin  cream (GARAMYCIN ) 0.1 % [id number]  Apply 1 Application topically daily to the bed wound  Patient not taking: Reported on 10/17/2023     Active   gentamicin  ointment (GARAMYCIN ) 0.1 % [id number]  Apply 1 Application topically daily.  Patient not taking: Reported on 10/17/2023     Active Self  glucose blood (TRUE METRIX BLOOD GLUCOSE TEST) test strip [id number]  Use to check blood sugar twice a day. Newlin, Enobong, MD  Active Self  insulin  glargine, 2 Unit Dial , (TOUJEO  MAX SOLOSTAR) 300 UNIT/ML Solostar Pen [id number]  Inject 70 Units into the skin daily. Newlin, Enobong, MD  Active   Insulin  Pen Needle (TECHLITE PLUS PEN NEEDLES) 32G X 4 MM MISC [id number]  use as directed Celestia Rosaline SQUIBB, NP  Active   lisinopril  (ZESTRIL ) 2.5 MG tablet [id number]  Take 1 tablet (2.5 mg total) by mouth daily. Celestia Rosaline SQUIBB, NP  Active   metFORMIN  (GLUCOPHAGE -XR) 500 MG 24 hr tablet [id number]  Take 2 tablets (1,000 mg total) by mouth 2 (two) times daily. Celestia Rosaline SQUIBB, NP  Active   oxyCODONE  (OXY IR/ROXICODONE ) 5 MG immediate release tablet [id number]  Take 5 mg by mouth every 6 (six) hours as needed for moderate pain or severe pain. [provider]  Active Self           Med Note ALLEGRA, [facility] I   Fri Oct 26, 2021  1:46 AM)    Semaglutide , 2 MG/DOSE, 8 MG/3ML SOPN [id number]  Inject 2 mg as directed once a week. Newlin, Enobong, MD  Active   sodium hypochlorite (DAKIN'S 1/2 STRENGTH) external solution [id number]  Use as directed for wet to dry dressings daily to wound.  Patient not taking: Reported on 11/06/2023   Rosan Raisin Ratliff, DO  Active   triamcinolone  cream (KENALOG ) 0.1 % [id number]  Apply 1 Application topically daily as needed on areas of skin irritation  Patient not taking: Reported on 11/06/2023     Active   TRUEplus Lancets 28G MISC [id number]  Use to check blood  sugar twice a day. Newlin, Enobong, MD  Active Self            Recommendation:   Continue Current Plan of Care  Follow Up Plan:   Telephone follow-up in 1 month  Rolin Kerns, LCSW [facility]  [facility], [facility] Clinical Social Worker Direct Dial : [phone]  Fax: [phone] Website: [email or web address] 4:19 PM

## 2023-12-01 NOTE — Patient Instructions (Signed)
 Visit Information  [name] was given information about Medicaid Managed Care team care coordination services as a part of their [employer] Medicaid benefit.   If you would like to schedule transportation through your [facility] plan, please call the following number at least 2 days in advance of your appointment: [phone]  If you are experiencing a behavioral health crisis, call the [employer] [place]  Behavioral Health Crisis Line at [phone]). The line is available 24 hours a day, seven days a week.    Please see education materials related to topics discussed provided by MyChart link.  Care plan and visit instructions communicated with the patient verbally today. Patient agrees to receive a copy in MyChart. Active MyChart status and patient understanding of how to access instructions and care plan via MyChart confirmed with patient.     Licensed Clinical Social Worker will call pt on 12/15 at 11:30 AM  Rolin Kerns, LCSW [place]  [facility], [facility] Clinical Social Worker Direct Dial : [phone]  Fax: [phone] Website: [email or web address] 4:20 PM   Following is a copy of your plan of care:  There are no care plans that you recently modified to display for this patient.

## 2023-12-02 ENCOUNTER — Encounter (HOSPITAL_BASED_OUTPATIENT_CLINIC_OR_DEPARTMENT_OTHER): Admitting: Internal Medicine

## 2023-12-02 DIAGNOSIS — E11621 Type 2 diabetes mellitus with foot ulcer: Secondary | ICD-10-CM

## 2023-12-02 DIAGNOSIS — L97512 Non-pressure chronic ulcer of other part of right foot with fat layer exposed: Secondary | ICD-10-CM

## 2023-12-03 ENCOUNTER — Other Ambulatory Visit: Payer: Self-pay

## 2023-12-03 NOTE — Patient Outreach (Signed)
 Social Drivers of Health  Community Resource and Care Coordination Visit Note   12/03/2023  Name: [name] MRN: [id number] DOB:[birth year]  Situation: Referral received for [facility] needs assessment and assistance related to Transportation. I obtained verbal consent from Patient.  Visit completed with Patient on the phone.   Background:   SDOH Interventions Today    Flowsheet Row Most Recent Value  SDOH Interventions   Food Insecurity Interventions Intervention Not Indicated  [wants food resources for future use.]  Housing Interventions Intervention Not Indicated  [patient does not pay rent with mother. Patient lives with mother.]  Transportation Interventions Patient Resources (Friends/Family), Payor Benefit  [mother provides transportation to medical appts when she can. Patient says she has not had a good experience with medicaid transportation and their forms of transportation (wheel-chair acessibility).]  Utilities Interventions Intervention Not Indicated     Assessment:   Goals Addressed             This Visit's Progress    BSW Goal       Current SDOH Barriers:  Transportation  Interventions: Patient interviewed and appropriate screenings performed Referred patient to community resources  Provided patient with information about Medicaid transportation Discussed plans with patient for ongoing follow up and provided patient with direct contact number Advised patient to to use medicaid transportation for medical appointments. One pager was provided via mail on transportation.           Recommendation:   attend all scheduled provider appointments call for transportation assistance at least one week before appointments ask for help if you don't understand your health insurance benefits Use medicaid transportation for medical appointments.   Follow Up Plan:   Telephone follow up appointment date/time:  12/17/2023 at 1pm  Laymon Doll, [place]/VBCI -  [facility] Social Worker [phone]

## 2023-12-03 NOTE — Patient Instructions (Signed)
 Visit Information  [name] was given information about Medicaid Managed Care team care coordination services as a part of their [employer] Medicaid benefit.   If you would like to schedule transportation through your [facility] plan, please call the following number at least 2 days in advance of your appointment: [phone]  If you are experiencing a behavioral health crisis, call the [employer] [place]  Behavioral Health Crisis Line at [phone]). The line is available 24 hours a day, seven days a week.    Please see education materials related to transportation and food resources provided by print via mail  Care plan and visit instructions communicated with the patient verbally today. Patient agrees to receive a copy in MyChart. Active MyChart status and patient understanding of how to access instructions and care plan via MyChart confirmed with patient.     Telephone follow up appointment with Managed Medicaid care management team member scheduled for: 12/17/2023 at 1:00pm  Laymon Doll, [place]/VBCI - [facility] Social Worker [phone]   Following is a copy of your plan of care:  There are no care plans that you recently modified to display for this patient.

## 2023-12-09 ENCOUNTER — Encounter: Payer: Self-pay | Admitting: Infectious Diseases

## 2023-12-09 ENCOUNTER — Other Ambulatory Visit: Payer: Self-pay

## 2023-12-09 ENCOUNTER — Ambulatory Visit (INDEPENDENT_AMBULATORY_CARE_PROVIDER_SITE_OTHER): Admitting: Infectious Diseases

## 2023-12-09 VITALS — BP 97/68 | HR 92 | Temp 98.4°F | Ht 69.0 in | Wt 320.0 lb

## 2023-12-09 DIAGNOSIS — L089 Local infection of the skin and subcutaneous tissue, unspecified: Secondary | ICD-10-CM

## 2023-12-09 DIAGNOSIS — Z5181 Encounter for therapeutic drug level monitoring: Secondary | ICD-10-CM

## 2023-12-09 DIAGNOSIS — Z7185 Encounter for immunization safety counseling: Secondary | ICD-10-CM | POA: Diagnosis not present

## 2023-12-09 DIAGNOSIS — M869 Osteomyelitis, unspecified: Secondary | ICD-10-CM

## 2023-12-09 DIAGNOSIS — E11628 Type 2 diabetes mellitus with other skin complications: Secondary | ICD-10-CM

## 2023-12-09 NOTE — Progress Notes (Unsigned)
 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   Diabetic foot infection (HCC) 10/26/2021   Osteomyelitis of right foot (HCC) 03/27/2021   Hyponatremia 03/09/2021   HTN (hypertension) 03/08/2021   Deep vein thrombosis (DVT) of right lower extremity (HCC) 03/08/2021   Wound dehiscence, surgical 02/02/2021   Obesity 02/02/2021   Wound dehiscence 01/24/2021   Medication monitoring encounter 01/02/2021   Diabetic ulcer of right foot associated with diabetes mellitus due to underlying condition (HCC) 01/02/2021   Right foot infection    Osteomyelitis of second toe of right foot (HCC) 12/16/2020   Type 2 diabetes mellitus with hyperglycemia (HCC) 12/16/2020   Class 3 obesity (HCC) 12/16/2020   Hyperlipidemia 12/16/2020   Soft tissue mass 09/15/2015    Patient's Medications  New Prescriptions   No medications on file  Previous Medications   APIXABAN  (ELIQUIS ) 5 MG TABS TABLET    Take 1 tablet (5mg ) by mouth twice daily.   BLOOD GLUCOSE MONITORING SUPPL (TRUE METRIX METER) W/DEVICE KIT    Use to check blood sugar twice a day.   CONTINUOUS GLUCOSE SENSOR (FREESTYLE LIBRE 3 PLUS SENSOR) MISC    Use to check blood sugar continuously throughout the day. Change sensor every 15 days.   GABAPENTIN  (NEURONTIN ) 300 MG CAPSULE    Take 2 capsules by mouth 3 times daily.   GENTAMICIN  CREAM (GARAMYCIN ) 0.1 %    Apply 1 Application topically daily to the bed wound   GENTAMICIN  OINTMENT (GARAMYCIN ) 0.1 %    Apply 1 Application topically daily.   GLUCOSE BLOOD (TRUE METRIX BLOOD GLUCOSE TEST) TEST STRIP    Use to check blood sugar twice a day.   INSULIN  GLARGINE, 2 UNIT DIAL , (TOUJEO  MAX SOLOSTAR) 300 UNIT/ML SOLOSTAR PEN    Inject 70 Units into the skin daily.   INSULIN  PEN NEEDLE (TECHLITE PLUS PEN NEEDLES) 32G X 4 MM MISC    use as directed    LISINOPRIL  (ZESTRIL ) 2.5 MG TABLET    Take 1 tablet (2.5 mg total) by mouth daily.   METFORMIN  (GLUCOPHAGE -XR) 500 MG 24 HR TABLET    Take 2 tablets (1,000 mg total) by mouth 2 (two) times daily.   OXYCODONE  (OXY IR/ROXICODONE ) 5 MG IMMEDIATE RELEASE TABLET    Take 5 mg by mouth every 6 (six) hours as needed for moderate pain or severe pain.   SEMAGLUTIDE , 2 MG/DOSE, 8 MG/3ML SOPN    Inject 2 mg as directed once a week.   SODIUM HYPOCHLORITE (DAKIN'S 1/2 STRENGTH) EXTERNAL SOLUTION    Use as directed for wet to dry dressings daily to wound.   TRIAMCINOLONE  CREAM (KENALOG ) 0.1 %    Apply 1 Application topically daily as needed on areas of skin irritation   TRUEPLUS LANCETS 28G MISC    Use to check blood sugar twice a day.  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  She reports chronic wound on her right foot that has persisted for over a year, reopening in June or July of last year. Despite treatments including casting, collagen, silver  allergen, and gentamicin , the wound remains unhealed. She has been closely following with wound care. An MRI done on 9/16 concerning for osteomyelitis. Seen by Podiatry , and a bone biopsy/culture was  on 9/29 with results pending. She reports completing  two courses of Augmentin , each lasting two weeks, with the last dose taken approximately two weeks before the bone biopsy. She is not taking any antibiotics currently. No concerns in her left foot.   Seen by podiatry on 9/29 where RT TMA bone biopsy and aerobic/anaerobic cultures was performed.   She denies any systemic symptoms such as fevers, chills, nausea, vomiting, or diarrhea. Her diabetes is managed with a continuous glucose  monitor and is reportedly well-controlled, though blood sugar levels fluctuate with meals.  She uses marijuana, denies tobacco use, and occasionally consumes alcohol. She has limited sensation in her feet.   11/25 Seen by Dr. Dennise on 10/23, when switched to p.o. doxycycline  and Augmentin  for 4 more weeks to complete osteomyelitis treatment even though cultures were negative and biopsy was negative per her note. Icould not see actual biopsy results. Reports she has been compliant with doxycycline  and Augmentin  for the most part but may have missed few days here and there, still has a week worth of antibiotics left.  She has been discharged from wound care as rt TMA wound has healed. Labs from last visit discussed. Declined flu vaccine. No concerns otherwise.   Review of Systems: All systems reviewed with pertinent positive and negative as listed above  Past Medical History:  Diagnosis Date   Class 3 obesity (HCC) 12/16/2020   Diabetes mellitus    Past Surgical History:  Procedure Laterality Date   EXTREMITY CYST EXCISION     INCISION AND DRAINAGE OF WOUND Right 10/27/2021   Procedure: IRRIGATION AND DEBRIDEMENT WOUND Foot;  Surgeon: Barton Drape, MD;  Location: [facility];  Service: Orthopedics;  Laterality: Right;   MINOR IRRIGATION AND DEBRIDEMENT OF WOUND Right 03/10/2021   Procedure: MINOR IRRIGATION AND DEBRIDEMENT OF WOUND;  Surgeon: Barton Drape, MD;  Location: [facility];  Service: Orthopedics;  Laterality: Right;   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   Procedure: TRANSMETATARSAL AMPUTATION;  Surgeon: Barton Drape, MD;  Location: [facility];  Service: Orthopedics;  Laterality: Right;     Social History   Tobacco Use   Smoking status: Former    Types: Cigarettes  Vaping Use   Vaping status: Never Used  Substance Use Topics   Alcohol use: Yes    Comment: occ   Drug use: Yes    Types: Marijuana    Comment: occasionally    Family History  Problem Relation Age  of Onset   Hypertension Mother    Diabetes Mother    Diabetes Other     No Known Allergies  Health Maintenance  Topic Date Due   COVID-19 Vaccine (1) Never done   FOOT EXAM  Never done   OPHTHALMOLOGY EXAM  Never done   DTaP/Tdap/Td (1 - Tdap) Never done   Hepatitis B Vaccines 19-59 Average Risk (1 of 3 - 19+ 3-dose series) Never done   HPV VACCINES (1 - Risk 3-dose SCDM series) Never done   Mammogram  Never done   Influenza Vaccine  Never done   Cervical Cancer Screening (HPV/Pap Cotest)  03/17/2024 (Originally 05/16/2020)   Pneumococcal Vaccine (1 of 2 - PCV) 03/17/2024 (Originally 06/06/2000)   HEMOGLOBIN A1C  04/27/2024   Diabetic kidney evaluation - Urine ACR  07/02/2024   Diabetic kidney evaluation -  eGFR measurement  10/16/2024   Hepatitis C Screening  Completed   HIV Screening  Completed   Meningococcal B Vaccine  Aged Out    Objective: BP 97/68   Pulse 92   Temp 98.4 F (36.9 C) (Axillary)   Ht 5' 9 (1.753 m)   Wt (!) 320 lb (145.2 kg)   SpO2 98%   BMI 47.26 kg/m    Physical Exam Constitutional:      Appearance: Normal appearance. Morbid Obesity   HENT:     Head: Normocephalic and atraumatic.      Mouth: Mucous membranes are moist.  Eyes:    Conjunctiva/sclera: Conjunctivae normal.     Pupils: Pupils are equal, round, and b/l symmetrical    Cardiovascular:     Rate and Rhythm: Normal rate     Heart sounds  Pulmonary:     Effort: Pulmonary effort is normal.     Breath sounds:  Abdominal:     General: Non distended     Palpations:  Musculoskeletal:        General: ambulatory, Rt TMA site has healed without any signs of infection   Skin:    General: Skin is warm and dry.     Comments:  Neurological:     General: grossly non focal     Mental Status: awake, alert and oriented to person, place, and time.   Psychiatric:        Mood and Affect: Mood normal.   Lab Results Lab Results  Component Value Date   WBC 8.1 10/17/2023   HGB 12.4  10/17/2023   HCT 38.3 10/17/2023   MCV 82.2 10/17/2023   PLT 301 10/17/2023    Lab Results  Component Value Date   CREATININE 0.80 10/17/2023   BUN 17 10/17/2023   NA 138 10/17/2023   K 4.2 10/17/2023   CL 100 10/17/2023   CO2 28 10/17/2023    Lab Results  Component Value Date   ALT 5 (L) 10/17/2023   AST 8 (L) 10/17/2023   ALKPHOS 111 10/26/2021   BILITOT 0.3 10/17/2023    Lab Results  Component Value Date   CHOL 210 (H) 10/17/2021   HDL 56 10/17/2021   LDLCALC 126 (H) 10/17/2021   TRIG 159 (H) 10/17/2021   CHOLHDL 3.8 10/17/2021   No results found for: LABRPR, RPRTITER No results found for: HIV1RNAQUANT, HIV1RNAVL, CD4TABS   Microbiology Component 1 mo ago Comments  Anaerobic Culture Final report   C Ana 1 Comment No anaerobic growth in 72 hours.  Aerobic Culture Final report   C Aer 1 Comment No growth in 36 - 48 hours.   Imaging  9/16 MRI RT foot  IMPRESSION: 1. Plantar ulceration underlying the medial foot at the level of the medial cuneiform with acute osteomyelitis of the medial cuneiform. 2. Bone marrow edema within the residual first metatarsal base is also indicative of acute osteomyelitis. 3. No fluid collections.  04/24/23 ABI wnl bilaterally   10/27/23 ABI Summary:  Right: Resting right ankle-brachial index is within normal range.    Left: Resting left ankle-brachial index is within normal range. The left  toe-brachial index is abnormal.  Left toe pressure is >60 mmHg which suggests adequate perfusion for  healing.    Assessment/Plan # DFU/Rt foot osteomyelitis  - Linezolid  600 mg p.o. twice daily and Augmentin  875/125 mg p.o. twice daily for 2 weeks> doxycycline  100 mg p.o. twice daily plus Augmentin  875/125 mg p.o. twice daily for 4 weeks -  RT TMA has healed  - Adherence assessed, side effects reviewed/discussed and DDIs reviewed  - 10/3 and 10/27 labs reviewed and discussed, ESR and CRP are improving  Plan  - Complete  remaining pills of doxycycline  and Augmentin  to complete 6 weeks course, stop thereafter - Discussed signs and symptoms that could be suggestive of recurrence of infection like increased pain, swelling, redness or discharge, fevers etc and seek attention - Follow-up as needed/new concerns   # DM2/Morbid Obesity  - fu with PCP for BG control   # Immunization counseling  - Declined flu vaccine  I spent 30 minutes involved in face-to-face and non-face-to-face activities for this patient on the day of the visit. Professional time spent includes the following activities: Preparing to see the patient (review of tests), Obtaining and reviewing separately obtained history (wound care note 11/20, podiatry note 11/15. Dr Dennise note 10/23, PCP note 10/14), Performing a medically appropriate examination and evaluation, Ordering labs, Documenting clinical information in the EMR, Independently interpreting results (not separately reported), Communicating results to the patient, Counseling and educating the patient.  Of note, portions of this note may have been created with voice recognition software. While this note has been edited for accuracy, occasional wrong-word or 'sound-a-like' substitutions may have occurred due to the inherent limitations of voice recognition software.   Annalee Joseph, MD [facility] 12/09/2023, 1:35 PM

## 2023-12-10 LAB — CBC
HCT: 40.6 % (ref 35.9–46.0)
Hemoglobin: 13.2 g/dL (ref 11.7–15.5)
MCH: 28 pg (ref 27.0–33.0)
MCHC: 32.5 g/dL (ref 31.6–35.4)
MCV: 86 fL (ref 81.4–101.7)
MPV: 12 fL (ref 7.5–12.5)
Platelets: 307 Thousand/uL (ref 140–400)
RBC: 4.72 Million/uL (ref 3.80–5.10)
RDW: 15.2 % — ABNORMAL HIGH (ref 11.0–15.0)
WBC: 8.5 Thousand/uL (ref 3.8–10.8)

## 2023-12-10 LAB — COMPREHENSIVE METABOLIC PANEL WITH GFR
AG Ratio: 1.1 (calc) (ref 1.0–2.5)
ALT: 9 U/L (ref 6–29)
AST: 11 U/L (ref 10–30)
Albumin: 3.7 g/dL (ref 3.6–5.1)
Alkaline phosphatase (APISO): 92 U/L (ref 31–125)
BUN: 9 mg/dL (ref 7–25)
CO2: 26 mmol/L (ref 20–32)
Calcium: 9.8 mg/dL (ref 8.6–10.2)
Chloride: 101 mmol/L (ref 98–110)
Creat: 0.85 mg/dL (ref 0.50–0.99)
Globulin: 3.3 g/dL (ref 1.9–3.7)
Glucose, Bld: 174 mg/dL — ABNORMAL HIGH (ref 65–99)
Potassium: 4.6 mmol/L (ref 3.5–5.3)
Sodium: 138 mmol/L (ref 135–146)
Total Bilirubin: 0.5 mg/dL (ref 0.2–1.2)
Total Protein: 7 g/dL (ref 6.1–8.1)
eGFR: 88 mL/min/1.73m2 (ref >=60)

## 2023-12-14 ENCOUNTER — Ambulatory Visit: Payer: Self-pay | Admitting: Infectious Diseases

## 2023-12-15 ENCOUNTER — Telehealth: Payer: Self-pay | Admitting: Primary Care

## 2023-12-15 NOTE — Telephone Encounter (Signed)
 Confirmned appt for 12/2

## 2023-12-16 ENCOUNTER — Ambulatory Visit: Attending: Family Medicine | Admitting: Pharmacist

## 2023-12-16 ENCOUNTER — Encounter: Payer: Self-pay | Admitting: Pharmacist

## 2023-12-16 DIAGNOSIS — Z794 Long term (current) use of insulin: Secondary | ICD-10-CM | POA: Diagnosis not present

## 2023-12-16 DIAGNOSIS — Z7984 Long term (current) use of oral hypoglycemic drugs: Secondary | ICD-10-CM

## 2023-12-16 DIAGNOSIS — Z7985 Long-term (current) use of injectable non-insulin antidiabetic drugs: Secondary | ICD-10-CM

## 2023-12-16 DIAGNOSIS — E1165 Type 2 diabetes mellitus with hyperglycemia: Secondary | ICD-10-CM

## 2023-12-16 NOTE — Progress Notes (Signed)
 S:    [name] is a 43 y.o. female who presents for diabetes evaluation, education, and management.   PMH is significant for HTN, T2DM, diabetic foot infection and osteomyelitis, HLD, obesity, hx of DVT, hx of hyponatremia.   Patient was referred and last seen by Primary Care Provider, [name], on 07/03/2023. Pharmacy last saw her in October of this year. At that visit, A1c showed improvement. Was 9.4% at that time, down from 10.7% in June of this year.  Most significantly, her Libre CGM showed 64% TIR over the 14 days prior to that appt. I instructed her to continue Ozempic  for 3 weeks to complete 1 month of therapy before increasing to 2 mg weekly thereafter. Toujeo  and metformin  continued.  Since her last visit w/ me, she has been followed by Wound Care, Podiatry, and [facility]. Most recent of those was [facility] on 12/09/2023. Labs were not significant at that time. It was noted that she was discharged from Wound Care given that her right TMA wound has healed.  She was instructed to complete remaining antibiotics.   Today, patient arrives in good spirits and presents without any assistance. Regarding her DM, she denies any N/V, abdominal pain, or changes in vision. She is taking the Ozempic , Touejo, and metformin .   Family/Social History: former smoker; family hx of DM and HTN  Current diabetes medications include: Ozempic  2mg  weekly, Toujeo  70 units daily, metformin  1000mg  BID Patient reports adherence to taking all medications as prescribed.   Insurance coverage: Medicaid   Patient denies hypoglycemic events.  Patient denies polyuria, polydipsia.  Patient endorses neuropathy (nerve pain) but no changes from baseline.  Patient denies visual changes.  Patient reports self foot exams.   Patient reported dietary habits:  - Avoids sweets most of the time, tries to limit starches and carbs but admits to continued dietary indiscretion overall.  - Admits that she needs to do  better  - Drinks mostly water  Patient-reported exercise habits: - Tries to walk as much as possible but is still limited overall   O:  Date of Download: 12/16/23. Of note, last date of data upload was 12/09/23. Report from 10/29 - 12/09/2023 is as follows:  % Time CGM is active: 55% Average Glucose: 186 mg/dL Glucose Management Indicator: 7.8  Glucose Variability: 28.7% (goal <36%) Time in Goal:  - Time in range 70-180: 53% - Time above range: 47% - Peaks occur ~5-6 PM and stay > 180 until ~3AM.  Lab Results  Component Value Date   HGBA1C 9.4 (A) 10/28/2023   There were no vitals filed for this visit.  Lipid Panel     Component Value Date/Time   CHOL 210 (H) 10/17/2021 1159   TRIG 159 (H) 10/17/2021 1159   HDL 56 10/17/2021 1159   CHOLHDL 3.8 10/17/2021 1159   CHOLHDL 7.1 12/20/2020 0434   VLDL 21 12/20/2020 0434   LDLCALC 126 (H) 10/17/2021 1159   Clinical Atherosclerotic Cardiovascular Disease (ASCVD): No  The 10-year ASCVD risk score (Arnett DK, et al., 2019) is: 1%   Values used to calculate the score:     Age: 8 years     Clincally relevant sex: Female     Is Non-Hispanic African American: Yes     Diabetic: Yes     Tobacco smoker: No     Systolic Blood Pressure: 97 mmHg     Is BP treated: Yes     HDL Cholesterol: 56 mg/dL  Total Cholesterol: 210 mg/dL   Patient is participating in a Managed Medicaid Plan:  Yes   A/P: Diabetes longstanding, currently above goal but improving. GMI from her last 28-day report is 7.8% with a TIR of 53%. I anticipate that this will improve as she continues with the 2 mg Ozempic  dose. Additionally, she is working on lifestyle as well. Of note, she has lost ~15 lbs since June of this year. Commended her for this! Patient is able to verbalize appropriate hypoglycemia management plan. Medication adherence appears optimal.  -Continue Toujeo  70 units daily.  -Continue Ozempic  2 mg weekly.  -Continued metformin  1000mg  BID.   -Extensively discussed pathophysiology of diabetes, recommended lifestyle interventions, dietary effects on blood sugar control.  -Counseled on s/sx of and management of hypoglycemia.  -Asked patient to take more BG readings at different times throughout the day and bring readings to next appointment.  -Next A1c anticipated 01/2024.  Written patient instructions provided. Patient verbalized understanding of treatment plan.  Total time in face to face counseling 30 minutes.    Follow-up:  Pharmacist visit in 1-1.5 months.  [name], PharmD, [name], CPP Clinical Pharmacist [facility] [phone]

## 2023-12-17 ENCOUNTER — Other Ambulatory Visit: Payer: Self-pay

## 2023-12-17 NOTE — Patient Instructions (Signed)
 Visit Information  [name] was given information about Medicaid Managed Care team care coordination services as a part of their [employer] Medicaid benefit.   If you would like to schedule transportation through your [employer] [facility] plan, please call the following number at least 2 days in advance of your appointment: [phone]  If you are experiencing a behavioral health crisis, call the [employer] [place]  Behavioral Health Crisis Line at [phone]). The line is available 24 hours a day, seven days a week.  Care plan and visit instructions communicated with the patient verbally today. Patient agrees to receive a copy in MyChart. Active MyChart status and patient understanding of how to access instructions and care plan via MyChart confirmed with patient.     Telephone follow up appointment with Managed Medicaid care management team member scheduled for: 12/31/2023 at 1pm  Laymon Doll, [place]/VBCI - [facility] Social Worker [phone]   Following is a copy of your plan of care:  There are no care plans that you recently modified to display for this patient.

## 2023-12-17 NOTE — Patient Outreach (Signed)
 Social Drivers of Health  Community Resource and Care Coordination Visit Note   12/17/2023  Name: [name] MRN: [id number] DOB:1980-04-19  Situation: Referral received for [facility] needs assessment and assistance related to Transportation. I obtained verbal consent from Patient.  Visit completed with Patient on the phone.   Background:   SDOH Interventions Today    Flowsheet Row Most Recent Value  SDOH Interventions   Food Insecurity Interventions Community Resources Provided  [food resources in [place] were provided.]  Housing Interventions Intervention Not Indicated  Transportation Interventions Patient Resources (Friends/Family), Payor Benefit  [BSW completed HPTS ACCESS application. Part B will be faxed to PCP for completion.]  Utilities Interventions Intervention Not Indicated     Assessment:   Goals Addressed             This Visit's Progress    BSW Goal   On track    Current SDOH Barriers:  Transportation  Interventions: Patient interviewed and appropriate screenings performed Referred patient to community resources  Provided patient with information about Medicaid transportation Discussed plans with patient for ongoing follow up and provided patient with direct contact number Advised patient to to use medicaid transportation for medical appointments. One pager was provided via mail on transportation.  12/17/2023 BSW completed HPTS Mining Engineer. BSW will fax part B to PCP for completion.           Recommendation:   attend all scheduled provider appointments call for transportation assistance at least one week before appointments ask for help if you don't understand your health insurance benefits F/u with PCP office re ACCESS Transportation Application  Follow Up Plan:   Telephone follow up appointment date/time:  12/31/2023 at 1pm  Laymon Doll, [place]/VBCI - [facility] Social Worker [phone]

## 2023-12-29 ENCOUNTER — Telehealth: Payer: Self-pay | Admitting: Licensed Clinical Social Worker

## 2023-12-31 ENCOUNTER — Other Ambulatory Visit: Payer: Self-pay

## 2023-12-31 NOTE — Patient Instructions (Signed)
 Visit Information  [name] was given information about Medicaid Managed Care team care coordination services as a part of their [employer] Medicaid benefit.   If you would like to schedule transportation through your [employer] [facility] plan, please call the following number at least 2 days in advance of your appointment: [phone]  If you are experiencing a behavioral health crisis, call the [employer] [place]  Behavioral Health Crisis Line at [phone]). The line is available 24 hours a day, seven days a week.     Care plan and visit instructions communicated with the patient verbally today. Patient agrees to receive a copy in MyChart. Active MyChart status and patient understanding of how to access instructions and care plan via MyChart confirmed with patient.     Telephone follow up appointment with Managed Medicaid care management team member scheduled for: 01/21/2024 at 1:30pm  Laymon Doll, BSW [place]/VBCI - [facility] Social Worker [phone]   Following is a copy of your plan of care:   Goals Addressed             This Visit's Progress    BSW Goal       Current SDOH Barriers:  Transportation  Interventions: Patient interviewed and appropriate screenings performed Referred patient to community resources  Provided patient with information about Medicaid transportation Discussed plans with patient for ongoing follow up and provided patient with direct contact number Advised patient to to use medicaid transportation for medical appointments. One pager was provided via mail on transportation.  12/17/2023 BSW completed HPTS Mining Engineer. BSW will fax part B to PCP for completion. 12/31/2023 BSW spoke with staff at PCP office and confirmed fax number where part B could be faxed to. BSW faxed form over and requested it to be completed by PCP and returned for submission to  HPTS

## 2023-12-31 NOTE — Patient Outreach (Signed)
 Social Drivers of Health  Community Resource and Care Coordination Visit Note   12/31/2023  Name: [name] MRN: [id number] DOB:[birth year]  Situation: Referral received for [facility] needs assessment and assistance related to Transportation. I obtained verbal consent from Patient.  Visit completed with Patient on the phone.   Background:   SDOH Interventions Today    Flowsheet Row Most Recent Value  SDOH Interventions   Housing Interventions Intervention Not Indicated  Transportation Interventions Patient Resources (Friends/Family), Payor Benefit  [BSW faxed part B of HTPS ACCESS Application to PCP.]  Utilities Interventions Intervention Not Indicated     Assessment:   Goals Addressed             This Visit's Progress    BSW Goal       Current SDOH Barriers:  Transportation  Interventions: Patient interviewed and appropriate screenings performed Referred patient to community resources  Provided patient with information about Medicaid transportation Discussed plans with patient for ongoing follow up and provided patient with direct contact number Advised patient to to use medicaid transportation for medical appointments. One pager was provided via mail on transportation.  12/17/2023 BSW completed HPTS Mining Engineer. BSW will fax part B to PCP for completion. 12/31/2023 BSW spoke with staff at PCP office and confirmed fax number where part B could be faxed to. BSW faxed form over and requested it to be completed by PCP and returned for submission to HPTS          Recommendation:   attend all scheduled provider appointments call for transportation assistance at least one week before appointments ask for help if you don't understand your health insurance benefits  Follow Up Plan:   Telephone follow up appointment date/time:  01/21/2024 at 1:30pm  Laymon Doll, BSW [place]/VBCI - [facility] Social Worker [phone]

## 2024-01-02 NOTE — Telephone Encounter (Signed)
 Received SCAT forms for pt. I have forwarded form to our Case Manager

## 2024-01-13 ENCOUNTER — Telehealth: Payer: Self-pay

## 2024-01-13 NOTE — Telephone Encounter (Signed)
 Patient already has an appointment scheduled with [facility].

## 2024-01-19 ENCOUNTER — Telehealth: Payer: Self-pay

## 2024-01-19 ENCOUNTER — Ambulatory Visit: Admitting: Pharmacist

## 2024-01-19 ENCOUNTER — Telehealth: Payer: Self-pay | Admitting: Licensed Clinical Social Worker

## 2024-01-19 NOTE — Telephone Encounter (Signed)
 Application for [employer] received.  I called the patient to discuss the application questions and she said she was not feeling and requested I call her back at another time.   I told her that I will call her tomorrow and she was in agreement

## 2024-01-20 NOTE — Telephone Encounter (Signed)
 I called the patient to complete the [facility] application and her voicemail was not set up so I was unable to leave a message

## 2024-01-21 ENCOUNTER — Telehealth

## 2024-01-21 NOTE — Telephone Encounter (Addendum)
 I called the patient again today, and she said she said she is still fighting a bad cold. I told her that I can call her back tomorrow so she can continue to rest and she was in agreement.  She spoke about needing transportation to therapy appointments that she needs to schedule.  I explained to her that her insurance will provide free transportation to her appointments.  She said she still wants to apply for [facility] transportation and again I told her that I will call her tomorrow and we can work on the application,

## 2024-01-22 NOTE — Telephone Encounter (Signed)
 I spoke to the patient today and she was feeling much better.  I obtained the information needed to complete the [place] paratransit application and emailed the completed application to [employer], BSW/VBCI

## 2024-02-02 ENCOUNTER — Telehealth: Payer: Self-pay

## 2024-02-02 ENCOUNTER — Other Ambulatory Visit: Payer: Self-pay

## 2024-02-02 NOTE — Telephone Encounter (Signed)
 Pharmacy Patient Advocate Encounter  Received notification from [facility] MEDICAID that Prior Authorization for FREESTYLE LIBRE 3 PLUS SENSOR has been APPROVED from 02/02/2024 to 02/01/2025   PA #/Case ID/Reference #: [id number]

## 2024-02-02 NOTE — Telephone Encounter (Signed)
 Pharmacy Patient Advocate Encounter   Received notification from CoverMyMeds that prior authorization for FREESTYLE LIBRE 3 SENSOR is required/requested.   Insurance verification completed.   The patient is insured through [facility] MEDICAID.   Per test claim: PA required; PA submitted to above mentioned insurance via CoverMyMeds Key/confirmation #/EOC [id number] Status is pending

## 2024-02-02 NOTE — Patient Instructions (Signed)
 Visit Information  [name] was given information about Medicaid Managed Care team care coordination services as a part of their [employer] Medicaid benefit.   If you would like to schedule transportation through your [employer] [facility] plan, please call the following number at least 2 days in advance of your appointment: [phone]  If you are experiencing a behavioral health crisis, call the [employer] [place]  Behavioral Health Crisis Line at [phone]). The line is available 24 hours a day, seven days a week.   Care plan and visit instructions communicated with the patient verbally today. Patient agrees to receive a copy in MyChart. Active MyChart status and patient understanding of how to access instructions and care plan via MyChart confirmed with patient.     Social Worker will assist patient set up transportation RSVP via [employer] once her PT appts are scheduled.  Telephone follow up appointment with Managed Medicaid care management team member scheduled for: 02/16/2024 at 1:30pm  Laymon Doll, BSW [place]/VBCI - [facility] Social Worker [phone]   Following is a copy of your plan of care:   Goals Addressed             This Visit's Progress    BSW Goal   On track    Current SDOH Barriers:  Transportation  Interventions: Patient interviewed and appropriate screenings performed Referred patient to community resources  Provided patient with information about Medicaid transportation Discussed plans with patient for ongoing follow up and provided patient with direct contact number Advised patient to to use medicaid transportation for medical appointments. One pager was provided via mail on transportation.  12/17/2023 BSW completed HPTS Mining Engineer. BSW will fax part B to PCP for completion. 12/31/2023 BSW spoke with staff at PCP office and confirmed fax number where  part B could be faxed to. BSW faxed form over and requested it to be completed by PCP and returned for submission to HPTS 02/02/2024 BSW e-faxed HTPS application for processing. BSW also provided patient with contact info to f/u on her application.

## 2024-02-02 NOTE — Patient Outreach (Signed)
 Social Drivers of Health  Community Resource and Care Coordination Visit Note   02/02/2024  Name: [name] MRN: [id number] DOB:1981/01/01  Situation: Referral received for [facility] needs assessment and assistance related to Transportation. I obtained verbal consent from Patient.  Visit completed with Patient on the phone.   Background:   SDOH Interventions Today    Flowsheet Row Most Recent Value  SDOH Interventions   Food Insecurity Interventions Intervention Not Indicated  Housing Interventions Intervention Not Indicated  Transportation Interventions Patient Resources (Friends/Family), Payor Benefit, Other (Comment)  [BSW e-faxed HTPS applicaiton today for processing. Pt was provided with phone number for application f/u. Pt was also encouraged to use medicaid transportation benefit and plans to whenever physical therapy appts are scheduled.]  Utilities Interventions Intervention Not Indicated  Financial Strain Interventions Intervention Not Indicated     Assessment:   Goals Addressed             This Visit's Progress    BSW Goal   On track    Current SDOH Barriers:  Transportation  Interventions: Patient interviewed and appropriate screenings performed Referred patient to community resources  Provided patient with information about Medicaid transportation Discussed plans with patient for ongoing follow up and provided patient with direct contact number Advised patient to to use medicaid transportation for medical appointments. One pager was provided via mail on transportation.  12/17/2023 BSW completed HPTS Mining Engineer. BSW will fax part B to PCP for completion. 12/31/2023 BSW spoke with staff at PCP office and confirmed fax number where part B could be faxed to. BSW faxed form over and requested it to be completed by PCP and returned for submission to HPTS 02/02/2024 BSW e-faxed HTPS application for processing. BSW also provided patient with  contact info to f/u on her application.           Recommendation:   attend all scheduled provider appointments call for transportation assistance at least one week before appointments ask for help if you don't understand your health insurance benefits F/u with HTPS ACCESS re application.  Follow Up Plan:   Telephone follow up appointment date/time:  02/16/2024  Laymon Doll, BSW [place]/VBCI - [facility] Social Worker [phone]

## 2024-02-16 ENCOUNTER — Other Ambulatory Visit: Payer: Self-pay

## 2024-02-16 ENCOUNTER — Telehealth: Payer: Self-pay | Admitting: Licensed Clinical Social Worker

## 2024-02-16 NOTE — Patient Instructions (Signed)
 Visit Information  [name] was given information about Medicaid Managed Care team care coordination services as a part of their [employer] Medicaid benefit.   If you would like to schedule transportation through your [facility] [facility] plan, please call the following number at least 2 days in advance of your appointment: [phone]  If you are experiencing a behavioral health crisis, call the [employer] [place]  Behavioral Health Crisis Line at [phone]). The line is available 24 hours a day, seven days a week.    Care plan and visit instructions communicated with the patient verbally today. Patient agrees to receive a copy in MyChart. Active MyChart status and patient understanding of how to access instructions and care plan via MyChart confirmed with patient.     Social Worker will f/u with HPTS ACCESS re application via phone and/or email.  Telephone follow up appointment with Managed Medicaid care management team member scheduled for: 03/01/2024 at 1:30pm  Laymon Doll, BSW /VBCI - [facility] Social Worker [phone]   Following is a copy of your plan of care:   Goals Addressed             This Visit's Progress    BSW Goal       Current SDOH Barriers:  Transportation  Interventions: Patient interviewed and appropriate screenings performed Referred patient to community resources  Provided patient with information about Medicaid transportation Discussed plans with patient for ongoing follow up and provided patient with direct contact number Advised patient to to use medicaid transportation for medical appointments. One pager was provided via mail on transportation.  12/17/2023 BSW completed HPTS Mining Engineer. BSW will fax part B to PCP for completion. 12/31/2023 BSW spoke with staff at PCP office and confirmed fax number where part B could be faxed to. BSW faxed  form over and requested it to be completed by PCP and returned for submission to HPTS 02/02/2024 BSW e-faxed HTPS application for processing. BSW also provided patient with contact info to f/u on her application.  02/16/2024 Patient confirmed she is doing ok for now. Has not heard back from HPTS ACCESS application. BSW will do f/u and request an update.

## 2024-02-17 ENCOUNTER — Other Ambulatory Visit: Payer: Self-pay | Admitting: Primary Care

## 2024-02-17 ENCOUNTER — Other Ambulatory Visit: Payer: Self-pay

## 2024-02-17 DIAGNOSIS — Z794 Long term (current) use of insulin: Secondary | ICD-10-CM

## 2024-02-19 NOTE — Telephone Encounter (Signed)
 Requested medication (s) are due for refill today: yes   Requested medication (s) are on the active medication list: yes   Last refill:  03/21/23 #100 2 refills  Future visit scheduled: no   Notes to clinic:  do you want to refill for insupen pens and refill at [facility] long pharmacy?      Requested Prescriptions  Pending Prescriptions Disp Refills   Insulin  Pen Needle (INSUPEN PEN NEEDLES) 32G X 4 MM MISC 100 each 2    Sig: use as directed     Endocrinology: Diabetes - Testing Supplies Passed - 02/19/2024 10:34 AM      Passed - Valid encounter within last 12 months    Recent Outpatient Visits           2 months ago Type 2 diabetes mellitus with hyperglycemia, with long-term current use of insulin  (HCC)   [facility] [place] Fleeta Morris, [place] L, RPH-CPP   3 months ago Type 2 diabetes mellitus with hyperglycemia, with long-term current use of insulin  [facility])   [facility] [place] Fleeta Morris, [place] L, RPH-CPP   6 months ago Type 2 diabetes mellitus with hyperglycemia, with long-term current use of insulin  [facility])   [facility] [place]. [facility] Fleeta Morris, [place] L, RPH-CPP   7 months ago Type 2 diabetes mellitus with hyperglycemia, unspecified whether long term insulin  use [facility])   [facility] Celestia Rosaline SQUIBB, NP   11 months ago Type 2 diabetes mellitus with hyperglycemia, unspecified whether long term insulin  use [facility])   [facility] Celestia Rosaline SQUIBB, NP

## 2024-02-20 MED ORDER — INSUPEN PEN NEEDLES 32G X 4 MM MISC
2 refills | Status: AC
  Filled 2024-02-20: qty 100, fill #0

## 2024-03-01 ENCOUNTER — Telehealth

## 2024-03-05 ENCOUNTER — Ambulatory Visit: Admitting: Pharmacist

## 2024-03-15 ENCOUNTER — Telehealth: Payer: Self-pay | Admitting: Licensed Clinical Social Worker
